# Patient Record
Sex: Female | Born: 1965
Health system: Southern US, Community
[De-identification: ages and names within clinical notes are randomized; demographics above are authoritative.]

## PROBLEM LIST (undated history)

## (undated) DIAGNOSIS — M5020 Other cervical disc displacement, unspecified cervical region: Secondary | ICD-10-CM

## (undated) DIAGNOSIS — M545 Low back pain, unspecified: Secondary | ICD-10-CM

## (undated) DIAGNOSIS — F419 Anxiety disorder, unspecified: Secondary | ICD-10-CM

## (undated) DIAGNOSIS — H9190 Unspecified hearing loss, unspecified ear: Secondary | ICD-10-CM

## (undated) DIAGNOSIS — E1143 Type 2 diabetes mellitus with diabetic autonomic (poly)neuropathy: Secondary | ICD-10-CM

## (undated) DIAGNOSIS — Z9889 Other specified postprocedural states: Secondary | ICD-10-CM

## (undated) DIAGNOSIS — J449 Chronic obstructive pulmonary disease, unspecified: Secondary | ICD-10-CM

## (undated) DIAGNOSIS — Z9289 Personal history of other medical treatment: Secondary | ICD-10-CM

## (undated) DIAGNOSIS — M1990 Unspecified osteoarthritis, unspecified site: Secondary | ICD-10-CM

## (undated) DIAGNOSIS — F32A Depression, unspecified: Secondary | ICD-10-CM

## (undated) DIAGNOSIS — E119 Type 2 diabetes mellitus without complications: Secondary | ICD-10-CM

## (undated) DIAGNOSIS — H409 Unspecified glaucoma: Secondary | ICD-10-CM

## (undated) DIAGNOSIS — I2510 Atherosclerotic heart disease of native coronary artery without angina pectoris: Secondary | ICD-10-CM

## (undated) DIAGNOSIS — R112 Nausea with vomiting, unspecified: Secondary | ICD-10-CM

## (undated) DIAGNOSIS — M722 Plantar fascial fibromatosis: Secondary | ICD-10-CM

## (undated) DIAGNOSIS — E785 Hyperlipidemia, unspecified: Secondary | ICD-10-CM

## (undated) DIAGNOSIS — K5792 Diverticulitis of intestine, part unspecified, without perforation or abscess without bleeding: Secondary | ICD-10-CM

## (undated) DIAGNOSIS — K219 Gastro-esophageal reflux disease without esophagitis: Secondary | ICD-10-CM

## (undated) DIAGNOSIS — G971 Other reaction to spinal and lumbar puncture: Secondary | ICD-10-CM

## (undated) DIAGNOSIS — F329 Major depressive disorder, single episode, unspecified: Secondary | ICD-10-CM

## (undated) DIAGNOSIS — F319 Bipolar disorder, unspecified: Secondary | ICD-10-CM

## (undated) DIAGNOSIS — G4730 Sleep apnea, unspecified: Secondary | ICD-10-CM

## (undated) DIAGNOSIS — J45909 Unspecified asthma, uncomplicated: Secondary | ICD-10-CM

## (undated) DIAGNOSIS — N390 Urinary tract infection, site not specified: Secondary | ICD-10-CM

## (undated) DIAGNOSIS — G4733 Obstructive sleep apnea (adult) (pediatric): Secondary | ICD-10-CM

## (undated) DIAGNOSIS — I10 Essential (primary) hypertension: Secondary | ICD-10-CM

## (undated) HISTORY — PX: OVARIAN CYST REMOVAL: SHX89

## (undated) HISTORY — DX: Chronic obstructive pulmonary disease, unspecified: J44.9

## (undated) HISTORY — DX: Atherosclerotic heart disease of native coronary artery without angina pectoris: I25.10

## (undated) HISTORY — DX: Plantar fascial fibromatosis: M72.2

## (undated) HISTORY — DX: Hyperlipidemia, unspecified: E78.5

## (undated) HISTORY — PX: ABDOMINAL HYSTERECTOMY: SHX81

## (undated) HISTORY — DX: Urinary tract infection, site not specified: N39.0

## (undated) HISTORY — DX: Obstructive sleep apnea (adult) (pediatric): G47.33

## (undated) HISTORY — PX: CARDIAC SURGERY: SHX584

## (undated) HISTORY — DX: Diverticulitis of intestine, part unspecified, without perforation or abscess without bleeding: K57.92

## (undated) HISTORY — DX: Unspecified glaucoma: H40.9

## (undated) HISTORY — DX: Type 2 diabetes mellitus with diabetic autonomic (poly)neuropathy: E11.43

## (undated) HISTORY — DX: Anxiety disorder, unspecified: F41.9

## (undated) HISTORY — DX: Personal history of other medical treatment: Z92.89

## (undated) HISTORY — DX: Bipolar disorder, unspecified: F31.9

---

## 1993-07-14 HISTORY — PX: ABDOMINAL SURGERY: SHX537

## 2008-07-14 HISTORY — PX: CORONARY ANGIOPLASTY WITH STENT PLACEMENT: SHX49

## 2013-09-18 DIAGNOSIS — K3184 Gastroparesis: Secondary | ICD-10-CM | POA: Diagnosis not present

## 2013-09-18 DIAGNOSIS — I2510 Atherosclerotic heart disease of native coronary artery without angina pectoris: Secondary | ICD-10-CM | POA: Diagnosis not present

## 2013-09-18 DIAGNOSIS — K219 Gastro-esophageal reflux disease without esophagitis: Secondary | ICD-10-CM | POA: Diagnosis not present

## 2013-09-18 DIAGNOSIS — R1011 Right upper quadrant pain: Secondary | ICD-10-CM | POA: Diagnosis not present

## 2014-10-26 ENCOUNTER — Emergency Department: Admit: 2014-10-26 | Disposition: A | Discharge status: Home / Self Care | Payer: Self-pay | Admitting: Internal Medicine

## 2014-10-26 DIAGNOSIS — R51 Headache: Secondary | ICD-10-CM | POA: Diagnosis not present

## 2014-10-26 DIAGNOSIS — I10 Essential (primary) hypertension: Secondary | ICD-10-CM | POA: Diagnosis not present

## 2014-10-26 LAB — DIFFERENTIAL
Basophil #: 0 10*3/uL (ref 0.0–0.1)
Basophil %: 0.5 %
Eosinophil #: 0.3 10*3/uL (ref 0.0–0.7)
Eosinophil %: 2.7 %
Lymphocyte #: 3.3 10*3/uL (ref 1.0–3.6)
Lymphocyte %: 32.1 %
Monocyte #: 0.8 x10 3/mm (ref 0.2–0.9)
Monocyte %: 8 %
Neutrophil #: 5.9 10*3/uL (ref 1.4–6.5)
Neutrophil %: 56.7 %

## 2014-10-26 LAB — BASIC METABOLIC PANEL
Anion Gap: 7 (ref 7–16)
BUN: 17 mg/dL
Calcium, Total: 9.3 mg/dL
Chloride: 106 mmol/L
Co2: 27 mmol/L
Creatinine: 0.81 mg/dL
EGFR (African American): >60
EGFR (Non-African Amer.): >60
Glucose: 96 mg/dL
Potassium: 3.7 mmol/L
Sodium: 140 mmol/L

## 2014-10-26 LAB — CBC
HCT: 41.4 % (ref 35.0–47.0)
HGB: 13.9 g/dL (ref 12.0–16.0)
MCH: 29.5 pg (ref 26.0–34.0)
MCHC: 33.6 g/dL (ref 32.0–36.0)
MCV: 88 fL (ref 80–100)
Platelet: 342 10*3/uL (ref 150–440)
RBC: 4.72 10*6/uL (ref 3.80–5.20)
RDW: 14.3 % (ref 11.5–14.5)
WBC: 11.3 10*3/uL — ABNORMAL HIGH (ref 3.6–11.0)

## 2014-10-26 LAB — URINALYSIS, COMPLETE
Bacteria: NONE SEEN
Bilirubin,UR: NEGATIVE
Blood: NEGATIVE
Glucose,UR: NEGATIVE mg/dL (ref 0–75)
Leukocyte Esterase: NEGATIVE
Nitrite: NEGATIVE
Ph: 5 (ref 4.5–8.0)
Protein: 30
Specific Gravity: 1.028 (ref 1.003–1.030)

## 2014-10-26 LAB — HEPATIC FUNCTION PANEL A (ARMC)
Albumin: 4.3 g/dL
Alkaline Phosphatase: 90 U/L
Bilirubin, Direct: <0.1 mg/dL
Bilirubin,Total: 0.7 mg/dL
Indirect Bilirubin: 0.7
SGOT(AST): 24 U/L
SGPT (ALT): 35 U/L
Total Protein: 7.6 g/dL

## 2014-10-26 LAB — TROPONIN I: Troponin-I: <0.03 ng/mL

## 2014-10-26 LAB — PROTIME-INR
INR: 0.9
Prothrombin Time: 12.8 secs

## 2014-10-26 LAB — LIPASE, BLOOD: Lipase: 28 U/L

## 2014-10-26 LAB — VALPROIC ACID LEVEL: Valproic Acid: 93 ug/mL (ref 50–100)

## 2015-01-02 DIAGNOSIS — L309 Dermatitis, unspecified: Secondary | ICD-10-CM | POA: Diagnosis not present

## 2015-01-02 DIAGNOSIS — Z955 Presence of coronary angioplasty implant and graft: Secondary | ICD-10-CM | POA: Diagnosis not present

## 2015-01-02 DIAGNOSIS — I10 Essential (primary) hypertension: Secondary | ICD-10-CM | POA: Diagnosis not present

## 2015-01-12 ENCOUNTER — Other Ambulatory Visit: Payer: Self-pay

## 2015-01-12 ENCOUNTER — Observation Stay
Admission: EM | Admit: 2015-01-12 | Discharge: 2015-01-13 | Disposition: A | Discharge status: Home / Self Care | Payer: Medicare Other | Attending: Surgery | Admitting: Surgery

## 2015-01-12 ENCOUNTER — Emergency Department: Payer: Medicare Other

## 2015-01-12 ENCOUNTER — Observation Stay: Payer: Medicare Other

## 2015-01-12 DIAGNOSIS — G8929 Other chronic pain: Secondary | ICD-10-CM

## 2015-01-12 DIAGNOSIS — R112 Nausea with vomiting, unspecified: Principal | ICD-10-CM | POA: Diagnosis present

## 2015-01-12 DIAGNOSIS — R1110 Vomiting, unspecified: Secondary | ICD-10-CM

## 2015-01-12 DIAGNOSIS — F17210 Nicotine dependence, cigarettes, uncomplicated: Secondary | ICD-10-CM | POA: Insufficient documentation

## 2015-01-12 DIAGNOSIS — E669 Obesity, unspecified: Secondary | ICD-10-CM | POA: Diagnosis not present

## 2015-01-12 DIAGNOSIS — E119 Type 2 diabetes mellitus without complications: Secondary | ICD-10-CM | POA: Diagnosis not present

## 2015-01-12 DIAGNOSIS — R197 Diarrhea, unspecified: Secondary | ICD-10-CM | POA: Insufficient documentation

## 2015-01-12 DIAGNOSIS — R109 Unspecified abdominal pain: Secondary | ICD-10-CM | POA: Diagnosis not present

## 2015-01-12 DIAGNOSIS — J45909 Unspecified asthma, uncomplicated: Secondary | ICD-10-CM | POA: Diagnosis not present

## 2015-01-12 DIAGNOSIS — K219 Gastro-esophageal reflux disease without esophagitis: Secondary | ICD-10-CM | POA: Insufficient documentation

## 2015-01-12 DIAGNOSIS — R1012 Left upper quadrant pain: Secondary | ICD-10-CM | POA: Diagnosis not present

## 2015-01-12 DIAGNOSIS — I10 Essential (primary) hypertension: Secondary | ICD-10-CM | POA: Insufficient documentation

## 2015-01-12 DIAGNOSIS — Z888 Allergy status to other drugs, medicaments and biological substances status: Secondary | ICD-10-CM | POA: Insufficient documentation

## 2015-01-12 DIAGNOSIS — F319 Bipolar disorder, unspecified: Secondary | ICD-10-CM | POA: Diagnosis not present

## 2015-01-12 DIAGNOSIS — R1084 Generalized abdominal pain: Secondary | ICD-10-CM | POA: Diagnosis not present

## 2015-01-12 DIAGNOSIS — Z6832 Body mass index (BMI) 32.0-32.9, adult: Secondary | ICD-10-CM | POA: Insufficient documentation

## 2015-01-12 HISTORY — DX: Depression, unspecified: F32.A

## 2015-01-12 HISTORY — DX: Essential (primary) hypertension: I10

## 2015-01-12 HISTORY — DX: Unspecified asthma, uncomplicated: J45.909

## 2015-01-12 HISTORY — DX: Major depressive disorder, single episode, unspecified: F32.9

## 2015-01-12 HISTORY — DX: Bipolar disorder, unspecified: F31.9

## 2015-01-12 HISTORY — DX: Type 2 diabetes mellitus without complications: E11.9

## 2015-01-12 LAB — CBC WITH DIFFERENTIAL/PLATELET
Basophils Absolute: 0.3 10*3/uL — ABNORMAL HIGH (ref 0–0.1)
Basophils Relative: 1 %
Eosinophils Absolute: 0.2 10*3/uL (ref 0–0.7)
Eosinophils Relative: 1 %
HCT: 44.8 % (ref 35.0–47.0)
Hemoglobin: 15 g/dL (ref 12.0–16.0)
Lymphocytes Relative: 18 %
Lymphs Abs: 3.8 10*3/uL — ABNORMAL HIGH (ref 1.0–3.6)
MCH: 29.6 pg (ref 26.0–34.0)
MCHC: 33.4 g/dL (ref 32.0–36.0)
MCV: 88.5 fL (ref 80.0–100.0)
Monocytes Absolute: 0.6 10*3/uL (ref 0.2–0.9)
Monocytes Relative: 3 %
Neutro Abs: 15.5 10*3/uL — ABNORMAL HIGH (ref 1.4–6.5)
Neutrophils Relative %: 77 %
Platelets: 407 10*3/uL (ref 150–440)
RBC: 5.07 MIL/uL (ref 3.80–5.20)
RDW: 13.5 % (ref 11.5–14.5)
WBC: 20.4 10*3/uL — ABNORMAL HIGH (ref 3.6–11.0)

## 2015-01-12 LAB — URINALYSIS COMPLETE WITH MICROSCOPIC (ARMC ONLY)
Bilirubin Urine: NEGATIVE
Glucose, UA: NEGATIVE mg/dL
Hgb urine dipstick: NEGATIVE
Ketones, ur: NEGATIVE mg/dL
Nitrite: NEGATIVE
Protein, ur: 100 mg/dL — AB
Specific Gravity, Urine: 1.024 (ref 1.005–1.030)
pH: 9 — ABNORMAL HIGH (ref 5.0–8.0)

## 2015-01-12 LAB — COMPREHENSIVE METABOLIC PANEL
ALT: 78 U/L — ABNORMAL HIGH (ref 14–54)
AST: 38 U/L (ref 15–41)
Albumin: 4.8 g/dL (ref 3.5–5.0)
Alkaline Phosphatase: 117 U/L (ref 38–126)
Anion gap: 17 — ABNORMAL HIGH (ref 5–15)
BUN: 14 mg/dL (ref 6–20)
CO2: 22 mmol/L (ref 22–32)
Calcium: 10 mg/dL (ref 8.9–10.3)
Chloride: 102 mmol/L (ref 101–111)
Creatinine, Ser: 0.82 mg/dL (ref 0.44–1.00)
GFR calc Af Amer: >60 mL/min (ref >60)
GFR calc non Af Amer: >60 mL/min (ref >60)
Glucose, Bld: 150 mg/dL — ABNORMAL HIGH (ref 65–99)
Potassium: 3.8 mmol/L (ref 3.5–5.1)
Sodium: 141 mmol/L (ref 135–145)
Total Bilirubin: 0.4 mg/dL (ref 0.3–1.2)
Total Protein: 8.5 g/dL — ABNORMAL HIGH (ref 6.5–8.1)

## 2015-01-12 LAB — LIPASE, BLOOD: Lipase: 34 U/L (ref 22–51)

## 2015-01-12 MED ORDER — ONDANSETRON HCL 4 MG/2ML IJ SOLN
INTRAMUSCULAR | Status: AC
  Filled 2015-01-12: qty 2

## 2015-01-12 MED ORDER — IOHEXOL 240 MG/ML SOLN
25.0000 mL | Freq: Once | INTRAMUSCULAR | Status: AC | PRN
  Administered 2015-01-12: 25 mL via INTRAVENOUS

## 2015-01-12 MED ORDER — HYDROMORPHONE HCL 1 MG/ML IJ SOLN
INTRAMUSCULAR | Status: AC
  Administered 2015-01-12: 1 mg via INTRAVENOUS
  Filled 2015-01-12: qty 1

## 2015-01-12 MED ORDER — BUTAMBEN-TETRACAINE-BENZOCAINE 2-2-14 % EX AERO
3.0000 | INHALATION_SPRAY | Freq: Once | CUTANEOUS | Status: AC
  Administered 2015-01-13: 3 via TOPICAL
  Filled 2015-01-12: qty 20

## 2015-01-12 MED ORDER — MORPHINE SULFATE 4 MG/ML IJ SOLN: 4.0000 mg | Freq: Once | INTRAMUSCULAR | Status: DC

## 2015-01-12 MED ORDER — SODIUM CHLORIDE 0.9 % IV BOLUS (SEPSIS)
1000.0000 mL | Freq: Once | INTRAVENOUS | Status: AC
  Administered 2015-01-12: 1000 mL via INTRAVENOUS

## 2015-01-12 MED ORDER — FAMOTIDINE IN NACL 20-0.9 MG/50ML-% IV SOLN
INTRAVENOUS | Status: AC
  Administered 2015-01-12: 20 mg via INTRAVENOUS
  Filled 2015-01-12: qty 50

## 2015-01-12 MED ORDER — SODIUM CHLORIDE 0.45 % IV SOLN
INTRAVENOUS | Status: DC
  Administered 2015-01-12: 23:00:00 via INTRAVENOUS

## 2015-01-12 MED ORDER — MORPHINE SULFATE 4 MG/ML IJ SOLN
4.0000 mg | INTRAMUSCULAR | Status: DC | PRN
  Administered 2015-01-13 (×2): 4 mg via INTRAVENOUS
  Filled 2015-01-12 (×2): qty 1

## 2015-01-12 MED ORDER — ONDANSETRON HCL 4 MG/2ML IJ SOLN: 4.0000 mg | Freq: Once | INTRAMUSCULAR | Status: DC

## 2015-01-12 MED ORDER — MORPHINE SULFATE 2 MG/ML IJ SOLN: 2.0000 mg | INTRAMUSCULAR | Status: DC | PRN

## 2015-01-12 MED ORDER — HYDROMORPHONE HCL 1 MG/ML IJ SOLN
1.0000 mg | Freq: Once | INTRAMUSCULAR | Status: AC
  Administered 2015-01-12: 1 mg via INTRAVENOUS

## 2015-01-12 MED ORDER — FAMOTIDINE IN NACL 20-0.9 MG/50ML-% IV SOLN
20.0000 mg | Freq: Two times a day (BID) | INTRAVENOUS | Status: DC
  Administered 2015-01-12 – 2015-01-13 (×2): 20 mg via INTRAVENOUS
  Filled 2015-01-12 (×3): qty 50

## 2015-01-12 MED ORDER — ONDANSETRON 8 MG PO TBDP
8.0000 mg | ORAL_TABLET | Freq: Once | ORAL | Status: AC
  Administered 2015-01-12: 8 mg via ORAL

## 2015-01-12 MED ORDER — PROMETHAZINE HCL 25 MG/ML IJ SOLN
12.5000 mg | Freq: Once | INTRAMUSCULAR | Status: AC
  Administered 2015-01-12: 12.5 mg via INTRAVENOUS

## 2015-01-12 MED ORDER — IOHEXOL 300 MG/ML  SOLN
100.0000 mL | Freq: Once | INTRAMUSCULAR | Status: AC | PRN
  Administered 2015-01-12: 100 mL via INTRAVENOUS

## 2015-01-12 MED ORDER — ONDANSETRON HCL 4 MG/2ML IJ SOLN
INTRAMUSCULAR | Status: AC
  Administered 2015-01-12: 4 mg via INTRAVENOUS
  Filled 2015-01-12: qty 2

## 2015-01-12 MED ORDER — ONDANSETRON 8 MG PO TBDP
ORAL_TABLET | ORAL | Status: AC
  Administered 2015-01-12: 8 mg via ORAL
  Filled 2015-01-12: qty 1

## 2015-01-12 MED ORDER — PROMETHAZINE HCL 25 MG/ML IJ SOLN: 12.5000 mg | INTRAMUSCULAR | Status: DC | PRN

## 2015-01-12 MED ORDER — PROMETHAZINE HCL 25 MG/ML IJ SOLN
INTRAMUSCULAR | Status: AC
  Administered 2015-01-12: 12.5 mg
  Filled 2015-01-12: qty 1

## 2015-01-12 MED ORDER — ONDANSETRON HCL 4 MG/2ML IJ SOLN
4.0000 mg | Freq: Once | INTRAMUSCULAR | Status: AC
  Administered 2015-01-12: 4 mg via INTRAVENOUS

## 2015-01-12 NOTE — ED Notes (Signed)
Pt c/o LUQ pain that radiates into her back with N/V/D that started at 430am..

## 2015-01-12 NOTE — ED Notes (Signed)
Pt has agreed to NG tube if ordered

## 2015-01-12 NOTE — ED Provider Notes (Signed)
[facility] Emergency Department Provider Note  ____________________________________________  Time seen: Approximately 6:54 PM  I have reviewed the triage vital signs and the nursing notes.   HISTORY  Chief Complaint Abdominal Pain; Nausea; Emesis; and Diarrhea    HPI Shawna Anderson is a 49 y.o. female with history of coronary artery disease status post stents, bipolar disorder, hypertension, hyperlipidemia, multiple abdominal operations including bowel resection for perforation, hysterectomy who presents for evaluation of 13 hours sharp left-sided abdominal pain and recurrent nonbloody nonbilious emesis. Sudden onset, constant since onset. Currently her symptoms are severe. She has also had nonbloody diarrhea and subjective fever. No chest pain or trouble breathing. No modifying factors. She reports she has had these symptoms in the past but does not remember what they were attributed to.   Past Medical History  Diagnosis Date  . Bipolar 1 disorder   . Depression   . Asthma     There are no active problems to display for this patient.   Past Surgical History  Procedure Laterality Date  . Abdominal surgery    . Coronary angioplasty with stent placement      No current outpatient prescriptions on file.  Allergies Review of patient's allergies indicates not on file.  No family history on file.  Social History History  Substance Use Topics  . Smoking status: Current Every Day Smoker -- 0.50 packs/day    Types: Cigarettes  . Smokeless tobacco: Never Used  . Alcohol Use: No    Review of Systems Constitutional: + subjective fever/chills Eyes: No visual changes. ENT: No sore throat. Cardiovascular: Denies chest pain. Respiratory: Denies shortness of breath. Gastrointestinal: + abdominal pain.  + nausea, + vomiting.  + diarrhea.  No constipation. Genitourinary: Negative for dysuria. Musculoskeletal: Negative for back pain. Skin: Negative  for rash. Neurological: Negative for headaches, focal weakness or numbness.  10-point ROS otherwise negative.  ____________________________________________   PHYSICAL EXAM:  VITAL SIGNS: ED Triage Vitals  Enc Vitals Group     BP 01/12/15 1728 167/155 mmHg     Pulse Rate 01/12/15 1728 124     Resp 01/12/15 1728 24     Temp 01/12/15 1728 98.2 F (36.8 C)     Temp Source 01/12/15 1728 Axillary     SpO2 01/12/15 1728 96 %     Weight 01/12/15 1728 185 lb (83.915 kg)     Height 01/12/15 1728 5\' 3"  (1.6 m)     Head Cir --      Peak Flow --      Pain Score 01/12/15 1729 10     Pain Loc --      Pain Edu? --      Excl. in [place]? --     Constitutional: Alert and oriented. A mild distress secondary to pain, vomiting/actively retching. Eyes: Conjunctivae are normal. PERRL. EOMI. Head: Atraumatic. Nose: No congestion/rhinnorhea. Mouth/Throat: Mucous membranes are dry.  Oropharynx non-erythematous. Neck: No stridor.  Cardiovascular: tachycardic rate, regular rhythm. Grossly normal heart sounds.  Good peripheral circulation. Respiratory: Normal respiratory effort.  No retractions. Lungs CTAB. Gastrointestinal: Soft with moderate tenderness to palpation throughout the left abdomen. No distention. No abdominal bruits. No CVA tenderness. Genitourinary: deferred Musculoskeletal: No lower extremity tenderness nor edema.  No joint effusions. Neurologic:  Normal speech and language. No gross focal neurologic deficits are appreciated. Speech is normal. No gait instability. Skin:  Skin is warm, dry and intact. No rash noted. Psychiatric: Mood and affect are normal. Speech and behavior are  normal.  ____________________________________________   LABS (all labs ordered are listed, but only abnormal results are displayed)  Labs Reviewed  CBC WITH DIFFERENTIAL/PLATELET - Abnormal; Notable for the following:    WBC 20.4 (*)    Neutro Abs 15.5 (*)    Lymphs Abs 3.8 (*)    Basophils Absolute 0.3  (*)    All other components within normal limits  COMPREHENSIVE METABOLIC PANEL - Abnormal; Notable for the following:    Glucose, Bld 150 (*)    Total Protein 8.5 (*)    ALT 78 (*)    Anion gap 17 (*)    All other components within normal limits  URINALYSIS COMPLETEWITH MICROSCOPIC ([facility] ONLY) - Abnormal; Notable for the following:    Color, Urine YELLOW (*)    APPearance CLOUDY (*)    pH 9.0 (*)    Protein, ur 100 (*)    Leukocytes, UA TRACE (*)    Bacteria, UA RARE (*)    Squamous Epithelial / LPF 6-30 (*)    All other components within normal limits  LIPASE, BLOOD   ____________________________________________  EKG  ED ECG REPORT I, Joanne Gavel, the attending physician, personally viewed and interpreted this ECG.   Date: 01/12/2015  EKG Time: 17:48  Rate: 131  Rhythm: sinus tachycardia  Axis: left  Intervals:none  ST&T Change: Severe motion artifact, no STEMI  ____________________________________________  RADIOLOGY  CT abdomen and pelvis  IMPRESSION: Suggestion of focal small bowel wall thickness possibly with a small intussusception in the proximal small bowel of the left mid abdomen. There is no evidence of small bowel obstruction. Further evaluation with small bowel series is recommended.  ____________________________________________   PROCEDURES  Procedure(s) performed: None  Critical Care performed: No  ____________________________________________   INITIAL IMPRESSION / ASSESSMENT AND PLAN / ED COURSE  Pertinent labs & imaging results that were available during my care of the patient were reviewed by me and considered in my medical decision making (see chart for details).  Shawna Anderson is a 49 y.o. female with history of coronary artery disease status post stents, bipolar disorder, hypertension, hyperlipidemia, multiple abdominal operations including bowel resection for perforation, hysterectomy who presents for evaluation of 13 hours  sharp left-sided abdominal pain and recurrent nonbloody nonbilious emesis. On exam, she is actively retching, appears to be in mild distress. She is tachycardic with moderate left-sided abdominal tenderness. Plan for screening labs, EKG, likely CT of the abdomen and pelvis. We'll give IV fluids, antiemetics and pain control.  ----------------------------------------- 9:49 PM on 01/12/2015 -----------------------------------------  CT concerning for possible small bowel intussusception. Discussed with Dr. Bary Castilla of general surgery who will admit for further management though he reports that small bowel intussusception in this patient is quite unlikely. ____________________________________________   FINAL CLINICAL IMPRESSION(S) / ED DIAGNOSES  Final diagnoses:  Left sided abdominal pain  Nausea, vomiting and diarrhea      Joanne Gavel, MD 01/12/15 2326

## 2015-01-12 NOTE — ED Notes (Signed)
MD at bedside. 

## 2015-01-12 NOTE — H&P (Signed)
Shawna Anderson is an 49 y.o. female.   Chief Complaint: Abdominal pain, vomiting. HPI: This 49 year old woman native of [place], [place] recently moved [place] in April of this year due to finances. She is yet to establish herself with a primary care physician and reports that she's been taking none of her regular medication since April. She was in the emergency department at that time with a blood pressure 250/150 and successfully treated. She again has been taking no regular medicine since that time. The patient reports that she was awakened from sleep at 4 AM on the morning of June 30 with left-sided abdominal pain that moved into the left upper quadrant. This is since been associated with multiple episodes of nausea and vomiting. She reported no change in bowel habits.  She reports a long history of reflux and acid indigestion.  She reports that prior to moving to [place] she did have a stress test, results are available.  The patient reports that she's had abdominal difficulties since childhood. Exploration in her 65s resulted in the resection of some part of her intestine although she is unclear what. Even after that she continued to have episodic abdominal pain and reported frequent episodes of abdominal bloating. The present episode was similar to what she experienced over a year ago while still living in [place] resulting in hospitalization but no surgical intervention.  Past Medical History  Diagnosis Date  . Bipolar 1 disorder   . Depression   . Asthma   . Diabetes mellitus without complication   . Hypertension     Past Surgical History  Procedure Laterality Date  . Abdominal surgery  1995    Bowel resection.  . Coronary angioplasty with stent placement  2010    Drug eluting stent    No family history on file. Social History:  reports that she has been smoking Cigarettes.  She has been smoking about 0.50 packs per day. She has never used  smokeless tobacco. She reports that she uses illicit drugs (Marijuana). She reports that she does not drink alcohol.  Allergies:  Allergies  Allergen Reactions  . Drixoral Allergy Sinus [Dexbromphen-Pse-Apap Er] Rash     (Not in a hospital admission)  Results for orders placed or performed during the hospital encounter of 01/12/15 (from the past 48 hour(s))  CBC with Differential     Status: Abnormal   Collection Time: 01/12/15  5:33 PM  Result Value Ref Range   WBC 20.4 (H) 3.6 - 11.0 K/uL   RBC 5.07 3.80 - 5.20 MIL/uL   Hemoglobin 15.0 12.0 - 16.0 g/dL   HCT 44.8 35.0 - 47.0 %   MCV 88.5 80.0 - 100.0 fL   MCH 29.6 26.0 - 34.0 pg   MCHC 33.4 32.0 - 36.0 g/dL   RDW 13.5 11.5 - 14.5 %   Platelets 407 150 - 440 K/uL   Neutrophils Relative % 77 %   Neutro Abs 15.5 (H) 1.4 - 6.5 K/uL   Lymphocytes Relative 18 %   Lymphs Abs 3.8 (H) 1.0 - 3.6 K/uL   Monocytes Relative 3 %   Monocytes Absolute 0.6 0.2 - 0.9 K/uL   Eosinophils Relative 1 %   Eosinophils Absolute 0.2 0 - 0.7 K/uL   Basophils Relative 1 %   Basophils Absolute 0.3 (H) 0 - 0.1 K/uL  Comprehensive metabolic panel     Status: Abnormal   Collection Time: 01/12/15  5:33 PM  Result Value Ref Range  Sodium 141 135 - 145 mmol/L   Potassium 3.8 3.5 - 5.1 mmol/L   Chloride 102 101 - 111 mmol/L   CO2 22 22 - 32 mmol/L   Glucose, Bld 150 (H) 65 - 99 mg/dL   BUN 14 6 - 20 mg/dL   Creatinine, Ser 0.82 0.44 - 1.00 mg/dL   Calcium 10.0 8.9 - 10.3 mg/dL   Total Protein 8.5 (H) 6.5 - 8.1 g/dL   Albumin 4.8 3.5 - 5.0 g/dL   AST 38 15 - 41 U/L   ALT 78 (H) 14 - 54 U/L   Alkaline Phosphatase 117 38 - 126 U/L   Total Bilirubin 0.4 0.3 - 1.2 mg/dL   GFR calc non Af Amer >60 >60 mL/min   GFR calc Af Amer >60 >60 mL/min    Comment: (NOTE) The eGFR has been calculated using the CKD EPI equation. This calculation has not been validated in all clinical situations. eGFR's persistently <60 mL/min signify possible Chronic  Kidney Disease.    Anion gap 17 (H) 5 - 15  Lipase, blood     Status: None   Collection Time: 01/12/15  5:33 PM  Result Value Ref Range   Lipase 34 22 - 51 U/L  Urinalysis complete, with microscopic ([facility] only)     Status: Abnormal   Collection Time: 01/12/15  5:33 PM  Result Value Ref Range   Color, Urine YELLOW (A) YELLOW   APPearance CLOUDY (A) CLEAR   Glucose, UA NEGATIVE NEGATIVE mg/dL   Bilirubin Urine NEGATIVE NEGATIVE   Ketones, ur NEGATIVE NEGATIVE mg/dL   Specific Gravity, Urine 1.024 1.005 - 1.030   Hgb urine dipstick NEGATIVE NEGATIVE   pH 9.0 (H) 5.0 - 8.0   Protein, ur 100 (A) NEGATIVE mg/dL   Nitrite NEGATIVE NEGATIVE   Leukocytes, UA TRACE (A) NEGATIVE   RBC / HPF 6-30 0 - 5 RBC/hpf   WBC, UA 0-5 0 - 5 WBC/hpf   Bacteria, UA RARE (A) NONE SEEN   Squamous Epithelial / LPF 6-30 (A) NONE SEEN   Mucous PRESENT    Ct Abdomen Pelvis W Contrast  01/12/2015   CLINICAL DATA:  Left upper quadrant pain. Nausea, vomiting, diarrhea today.  EXAM: CT ABDOMEN AND PELVIS WITH CONTRAST  TECHNIQUE: Multidetector CT imaging of the abdomen and pelvis was performed using the standard protocol following bolus administration of intravenous contrast.  CONTRAST:  126m OMNIPAQUE IOHEXOL 300 MG/ML  SOLN  COMPARISON:  None.  FINDINGS: The liver, spleen, pancreas, gallbladder, adrenal glands and kidneys are normal. There is mild atherosclerosis of the abdominal aorta without aneurysmal dilatation. There is no abdominal lymphadenopathy.  There is suggestion of focal small bowel wall thickening possibly with a small intussusception in the proximal small bowel of the left mid abdomen. There is no small bowel obstruction. The colon is normal. The appendix tip is normal. The remainder of the appendix is not seen but no inflammation is noted around cecum.  There is mild scar of the posterior right lung base. There is no focal pneumonia or pleural effusion. No acute abnormalities identified in the visualized  bones.  IMPRESSION: Suggestion of focal small bowel wall thickness possibly with a small intussusception in the proximal small bowel of the left mid abdomen. There is no evidence of small bowel obstruction. Further evaluation with small bowel series is recommended.   Electronically Signed   By: WAbelardo DieselM.D.   On: 01/12/2015 21:12    Review of Systems  Constitutional: Negative.  HENT: Negative.   Eyes: Negative.   Respiratory: Positive for shortness of breath.   Cardiovascular: Negative.   Gastrointestinal: Positive for heartburn, nausea, vomiting and abdominal pain.  Genitourinary: Negative.   Musculoskeletal: Negative.   Skin: Negative.   Neurological: Negative.   Endo/Heme/Allergies: Negative.     Blood pressure 167/155, pulse 124, temperature 98.2 F (36.8 C), temperature source Axillary, resp. rate 24, height _0  (1.6 m), weight 185 lb (83.915 kg), SpO2 96 %. Physical Exam  Constitutional: She is oriented to person, place, and time. She appears well-developed and well-nourished.  HENT:  Head: Normocephalic and atraumatic.  Eyes: Conjunctivae are normal.  Neck: Normal range of motion. Neck supple. No thyromegaly present.  Cardiovascular: Normal rate and regular rhythm.   Respiratory: Effort normal and breath sounds normal.  GI: Soft. Bowel sounds are normal. Tenderness: Minimal left upper quadrant tenderness. No peritoneal irritation or guarding.   Musculoskeletal: Normal range of motion.  Neurological: She is alert and oriented to person, place, and time. She has normal reflexes.  Skin: Skin is warm and dry.  Psychiatric: Thought content normal. Her speech is rapid and/or pressured. She is hyperactive. Cognition and memory are normal.     Assessment/Plan Laboratory and CT studies reviewed. No evidence of free air. Questionable intussusception. Minimal proximal bowel dilatation. Marked gastric distention with oral CT contrast.  The patient continues to report pain  nauseated in spite of antiemetics. NG tube recommended and vehemently declined.  We'll hold the patient nothing by mouth, make use of IV H2 blocker and arrange for plain films of the abdomen.  Medical consultation will be obtained in the morning to review medication renewal.  Robert Bellow 01/12/2015, 10:33 PM

## 2015-01-12 NOTE — ED Notes (Signed)
Pt expressed reluctance to have NG tube and was told by surgeon that she had right to refuse it and explained to her that the nausea and pressure would be greatly reduced by it. Pt still not willing.

## 2015-01-13 ENCOUNTER — Encounter: Payer: Self-pay | Admitting: *Deleted

## 2015-01-13 LAB — CBC WITH DIFFERENTIAL/PLATELET
Basophils Absolute: 0.2 10*3/uL — ABNORMAL HIGH (ref 0–0.1)
Basophils Relative: 1 %
Eosinophils Absolute: 0 10*3/uL (ref 0–0.7)
Eosinophils Relative: 0 %
HCT: 41.9 % (ref 35.0–47.0)
Hemoglobin: 13.6 g/dL (ref 12.0–16.0)
Lymphocytes Relative: 20 %
Lymphs Abs: 3.4 10*3/uL (ref 1.0–3.6)
MCH: 28.7 pg (ref 26.0–34.0)
MCHC: 32.4 g/dL (ref 32.0–36.0)
MCV: 88.8 fL (ref 80.0–100.0)
Monocytes Absolute: 1.2 10*3/uL — ABNORMAL HIGH (ref 0.2–0.9)
Monocytes Relative: 7 %
Neutro Abs: 11.7 10*3/uL — ABNORMAL HIGH (ref 1.4–6.5)
Neutrophils Relative %: 72 %
Platelets: 366 10*3/uL (ref 150–440)
RBC: 4.72 MIL/uL (ref 3.80–5.20)
RDW: 13.8 % (ref 11.5–14.5)
WBC: 16.5 10*3/uL — ABNORMAL HIGH (ref 3.6–11.0)

## 2015-01-13 MED ORDER — PHENOL 1.4 % MT LIQD
1.0000 | OROMUCOSAL | Status: DC | PRN
  Filled 2015-01-13: qty 177

## 2015-01-13 NOTE — Care Management Note (Signed)
Case Management Note  Patient Details  Name: Shawna Anderson MRN: [id number] Date of Birth: 1965/10/16  Subjective/Objective:         [name] was already discharged and off the unit at 2pm when this writer arrived at her hospital room to present her with a Medicare OBS letter.            Action/Plan:   Expected Discharge Date:                  Expected Discharge Plan:     In-House Referral:     Discharge planning Services     Post Acute Care Choice:    Choice offered to:     DME Arranged:    DME Agency:     HH Arranged:    [place] Agency:     Status of Service:     Medicare Important Message Given:    Date Medicare IM Given:    Medicare IM give by:    Date Additional Medicare IM Given:    Additional Medicare Important Message give by:     If discussed at [place] of Stay Meetings, dates discussed:    Additional Comments:  Jennalee Greaves A, RN 01/13/2015, 1:57 PM

## 2015-01-13 NOTE — Discharge Instructions (Signed)
DISCHARGE INSTRUCTIONS TO PATIENT  REMINDER:   Carry a list of your medications and allergies with you at all times  Call your pharmacy at least 1 week in advance to refill prescriptions  Do not mix any prescribed pain medicine with alcohol  Do not drive any motor vehicles while taking pain medication.  Take medications with food.  Do not retake a pain medication if you vomit after taking it.  Activity: no lifting more than 15 pounds until instructed by your doctor.   Dressing Care Instruction (if applicable):              Remove operative dressings in 48 hours.  May Shower-  Call office if any questions regarding this activity.  Dry Dressing as needed to operative site.  Drain care instructions provided to you in the hospital.   Follow-up appointments (date to return to physician): Call for appointment with Dr. Sherri Rad, MD at [phone] or [phone]  If need MD on call after hours and on weekends call Hospital operator at [phone] as ask to speak to Surgeon on call for [facility].  Call Surgeon if you have:  Temperature greater than 100.4  Persistent nausea and vomiting  Severe uncontrolled pain  Redness, tenderness, or signs of infection (pain, swelling, redness, odor or green/yellow discharge around the site)  Difficulty breathing, headache or visual disturbances  Hives  Persistent dizziness or light-headedness  Extreme fatigue  Any other questions or concerns you may have after discharge  In an emergency, call 911 or go to an Emergency Department at a nearby hospital  Diet:  Resume your usual diet.  Avoid spicy, greasy or heavy foods.  If you have nausea or vomiting, go back to liquids.  If you cannot keep liquids down, call your doctor.  Avoid alcohol consumption while on prescription pain medications. Good nutrition promotes healing. Increase fiber and fluids.     I understand and acknowledge receipt of the above instructions.                                                                                                                                         Patient or Guardian Signature                                                                    Date/Time  Physician's or R.N.'s Signature                                                                  Date/Time  The discharge instructions have been reviewed with the patient and/or Family Member/Parent/Guardian.  Patient and/or Family Member/Parent/Guardian signed and retained a printed copy.

## 2015-01-13 NOTE — Discharge Summary (Signed)
Physician Discharge Summary  Patient ID: Shawna Anderson MRN: [id number] DOB/AGE: 1966-06-07 49 y.o.  Admit date: 01/12/2015 Discharge date: 01/13/2015  Admission Diagnoses: Abdominal pain, radiographic intussusception.  Discharge Diagnoses:  Active Problems:   Abdominal pain   Vomiting   Nausea and vomiting   Discharged Condition: Improved abdominal pain resolved.  Hospital Course: The patient was admitted with abdominal pain and leukocytosis. X-rays demonstrated what appeared to be an intussusception seen radiographically with no evidence of proximal small bowel obstruction. Nasogastric tube was not placed. By the following morning the patient was tolerating a regular diet and was pain-free and wanted to be discharged.  Consults: none.  Significant Diagnostic Studies: CT scan abdomen and pelvis  Discharge Exam: Blood pressure 142/92, pulse 65, temperature 98.6 F (37 C), temperature source Oral, resp. rate 16, height 5\' 3"  (1.6 m), weight 83.008 kg (183 lb), SpO2 93 %. Abdomen was soft obese and nontender with multiple scars.  Disposition: Final discharge disposition not confirmed     Medication List    TAKE these medications        albuterol 108 (90 BASE) MCG/ACT inhaler  Commonly known as:  PROVENTIL HFA;VENTOLIN HFA  Inhale 2 puffs into the lungs every 4 (four) hours as needed for wheezing or shortness of breath.     aspirin 81 MG tablet  Take 81 mg by mouth daily.     clopidogrel 75 MG tablet  Commonly known as:  PLAVIX  Take 75 mg by mouth daily.     divalproex 500 MG 24 hr tablet  Commonly known as:  DEPAKOTE ER  Take by mouth daily.     famotidine 40 MG tablet  Commonly known as:  PEPCID  Take 40 mg by mouth daily.     Fluticasone-Salmeterol 250-50 MCG/DOSE Aepb  Commonly known as:  ADVAIR  Inhale 1 puff into the lungs 2 (two) times daily.     lisinopril 10 MG tablet  Commonly known as:  PRINIVIL,ZESTRIL  Take 10 mg by mouth daily.     metoprolol 100 MG tablet  Commonly known as:  LOPRESSOR  Take 100 mg by mouth 2 (two) times daily.     pantoprazole 40 MG tablet  Commonly known as:  PROTONIX  Take 40 mg by mouth daily.     sertraline 50 MG tablet  Commonly known as:  ZOLOFT  Take 50 mg by mouth daily.     traZODone 150 MG tablet  Commonly known as:  DESYREL  Take 150 mg by mouth at bedtime.           Follow-up Information    Follow up with Sherri Rad, MD.   Specialties:  Surgery, Radiology   Why:  As needed   Contact information:   [address] [place] [address] [phone]       Signed: Sherri Rad 01/13/2015, 11:20 AM

## 2015-01-13 NOTE — Care Management (Signed)
Onsite case manager notified of need for Medicare Observation letter

## 2015-01-13 NOTE — Progress Notes (Signed)
Dr Bary Castilla notified of pt's complaints, wants of Dilaudid and antianxiety meds, , no new orders given

## 2015-01-13 NOTE — Progress Notes (Signed)
[facility]   PATIENT NAME: Shawna Anderson    MR#:  [id number]  DATE OF BIRTH:  12-17-1965  SUBJECTIVE:   No abdominal pain, hungry and wants to go home. NGT removed following scant output. REVIEW OF SYSTEMS:   Review of Systems  Constitutional: Negative for fever and chills.  Respiratory: Negative for cough.   Cardiovascular: Negative for chest pain.  Gastrointestinal: Negative for abdominal pain.  Skin: Negative for rash.  All other systems reviewed and are negative.   DRUG ALLERGIES:   Allergies  Allergen Reactions  . Drixoral Allergy Sinus [Dexbromphen-Pse-Apap Er] Rash    VITALS:  Blood pressure 142/92, pulse 65, temperature 98.6 F (37 C), temperature source Oral, resp. rate 16, height 5\' 3"  (1.6 m), weight 83.008 kg (183 lb), SpO2 93 %.  PHYSICAL EXAMINATION:  GENERAL:  49 y.o.-year-old patient lying in the bed with no acute distress.  EYES: Pupils equal, round, reactive to light and accommodation. No scleral icterus. Extraocular muscles intact.  HEENT: Head atraumatic, normocephalic. Oropharynx and nasopharynx clear.  NECK:  Supple, no jugular venous distention. No thyroid enlargement, no tenderness.  LUNGS: Normal breath sounds bilaterally, no wheezing, rales,rhonchi or crepitation. No use of accessory muscles of respiration.  CARDIOVASCULAR: S1, S2 normal. No murmurs, rubs, or gallops.  ABDOMEN: Soft, nontender, nondistended. Bowel sounds present. No organomegaly or mass. Multiple scars. EXTREMITIES: No pedal edema, cyanosis, or clubbing.  NEUROLOGIC: Cranial nerves II through XII are intact. Muscle strength 5/5 in all extremities. Sensation intact. Gait not checked.  PSYCHIATRIC: The patient is alert and oriented x 3.  SKIN: No obvious rash, lesion, or ulcer.   CBC Latest Ref Rng 01/13/2015 01/12/2015 10/26/2014  WBC 3.6 - 11.0 K/uL 16.5(H) 20.4(H) 11.3(H)  Hemoglobin 12.0 - 16.0 g/dL 13.6 15.0 13.9  Hematocrit 35.0 - 47.0 % 41.9 44.8 41.4   Platelets 150 - 440 K/uL 366 407 342   CMP Latest Ref Rng 01/12/2015 10/26/2014  Glucose 65 - 99 mg/dL 150(H) 96  BUN 6 - 20 mg/dL 14 17  Creatinine 0.44 - 1.00 mg/dL 0.82 0.81  Sodium 135 - 145 mmol/L 141 140  Potassium 3.5 - 5.1 mmol/L 3.8 3.7  Chloride 101 - 111 mmol/L 102 106  CO2 22 - 32 mmol/L 22 27  Calcium 8.9 - 10.3 mg/dL 10.0 9.3  Total Protein 6.5 - 8.1 g/dL 8.5(H) 7.6  Total Bilirubin 0.3 - 1.2 mg/dL 0.4 -  Alkaline Phos 38 - 126 U/L 117 90  AST 15 - 41 U/L 38 24  ALT 14 - 54 U/L 78(H) 35    Personally reviewed the CT scan findings.  I discussed this case in detail and reviewed the films with Dr. Bary Castilla who was covering for our practice last night.      ASSESSMENT AND PLAN:   49 year old obese female with prior complex intra-abdominal surgical history.  There are no clinical signs despite the x-ray findings of intussusception. I will advance her diet reexamine her later today and indeed if she is pain-free and no vomiting she can be discharged. I'm unclear as to the etiology of her white count elevation at this time.

## 2015-01-24 ENCOUNTER — Other Ambulatory Visit: Payer: Self-pay | Admitting: Physician Assistant

## 2015-01-24 DIAGNOSIS — I25119 Atherosclerotic heart disease of native coronary artery with unspecified angina pectoris: Secondary | ICD-10-CM | POA: Diagnosis not present

## 2015-01-24 DIAGNOSIS — R0602 Shortness of breath: Secondary | ICD-10-CM

## 2015-01-24 DIAGNOSIS — F3162 Bipolar disorder, current episode mixed, moderate: Secondary | ICD-10-CM | POA: Diagnosis not present

## 2015-01-24 DIAGNOSIS — I10 Essential (primary) hypertension: Secondary | ICD-10-CM | POA: Diagnosis not present

## 2015-01-24 DIAGNOSIS — F17210 Nicotine dependence, cigarettes, uncomplicated: Secondary | ICD-10-CM | POA: Diagnosis not present

## 2015-01-26 DIAGNOSIS — R079 Chest pain, unspecified: Secondary | ICD-10-CM | POA: Diagnosis not present

## 2015-01-26 DIAGNOSIS — I10 Essential (primary) hypertension: Secondary | ICD-10-CM | POA: Diagnosis not present

## 2015-01-31 DIAGNOSIS — R0602 Shortness of breath: Secondary | ICD-10-CM | POA: Diagnosis not present

## 2015-02-07 ENCOUNTER — Ambulatory Visit: Admission: RE | Admit: 2015-02-07 | Payer: Medicare Other | Source: Ambulatory Visit

## 2015-03-05 ENCOUNTER — Emergency Department: Payer: Medicare Other

## 2015-03-05 ENCOUNTER — Encounter: Payer: Self-pay | Admitting: Emergency Medicine

## 2015-03-05 ENCOUNTER — Emergency Department
Admission: EM | Admit: 2015-03-05 | Discharge: 2015-03-05 | Disposition: A | Discharge status: Home / Self Care | Payer: Medicare Other | Attending: Emergency Medicine | Admitting: Emergency Medicine

## 2015-03-05 DIAGNOSIS — R109 Unspecified abdominal pain: Secondary | ICD-10-CM | POA: Diagnosis not present

## 2015-03-05 DIAGNOSIS — R1084 Generalized abdominal pain: Secondary | ICD-10-CM | POA: Insufficient documentation

## 2015-03-05 DIAGNOSIS — Z79899 Other long term (current) drug therapy: Secondary | ICD-10-CM | POA: Insufficient documentation

## 2015-03-05 DIAGNOSIS — Z7902 Long term (current) use of antithrombotics/antiplatelets: Secondary | ICD-10-CM | POA: Insufficient documentation

## 2015-03-05 DIAGNOSIS — R140 Abdominal distension (gaseous): Secondary | ICD-10-CM | POA: Diagnosis not present

## 2015-03-05 DIAGNOSIS — Z7982 Long term (current) use of aspirin: Secondary | ICD-10-CM | POA: Diagnosis not present

## 2015-03-05 DIAGNOSIS — E119 Type 2 diabetes mellitus without complications: Secondary | ICD-10-CM | POA: Insufficient documentation

## 2015-03-05 DIAGNOSIS — R1110 Vomiting, unspecified: Secondary | ICD-10-CM | POA: Diagnosis not present

## 2015-03-05 DIAGNOSIS — R112 Nausea with vomiting, unspecified: Secondary | ICD-10-CM

## 2015-03-05 DIAGNOSIS — Z720 Tobacco use: Secondary | ICD-10-CM | POA: Insufficient documentation

## 2015-03-05 DIAGNOSIS — I10 Essential (primary) hypertension: Secondary | ICD-10-CM | POA: Diagnosis not present

## 2015-03-05 HISTORY — DX: Gastro-esophageal reflux disease without esophagitis: K21.9

## 2015-03-05 LAB — URINALYSIS COMPLETE WITH MICROSCOPIC (ARMC ONLY)
Bacteria, UA: NONE SEEN
Bilirubin Urine: NEGATIVE
Glucose, UA: NEGATIVE mg/dL
Ketones, ur: NEGATIVE mg/dL
Leukocytes, UA: NEGATIVE
Nitrite: NEGATIVE
Protein, ur: NEGATIVE mg/dL
Specific Gravity, Urine: 1.02 (ref 1.005–1.030)
pH: 6 (ref 5.0–8.0)

## 2015-03-05 LAB — COMPREHENSIVE METABOLIC PANEL
ALT: 86 U/L — ABNORMAL HIGH (ref 14–54)
AST: 77 U/L — ABNORMAL HIGH (ref 15–41)
Albumin: 4.4 g/dL (ref 3.5–5.0)
Alkaline Phosphatase: 124 U/L (ref 38–126)
Anion gap: 9 (ref 5–15)
BUN: 16 mg/dL (ref 6–20)
CO2: 28 mmol/L (ref 22–32)
Calcium: 9.4 mg/dL (ref 8.9–10.3)
Chloride: 104 mmol/L (ref 101–111)
Creatinine, Ser: 0.86 mg/dL (ref 0.44–1.00)
GFR calc Af Amer: >60 mL/min (ref >60)
GFR calc non Af Amer: >60 mL/min (ref >60)
Glucose, Bld: 126 mg/dL — ABNORMAL HIGH (ref 65–99)
Potassium: 3.6 mmol/L (ref 3.5–5.1)
Sodium: 141 mmol/L (ref 135–145)
Total Bilirubin: 0.2 mg/dL — ABNORMAL LOW (ref 0.3–1.2)
Total Protein: 7.6 g/dL (ref 6.5–8.1)

## 2015-03-05 LAB — CBC WITH DIFFERENTIAL/PLATELET
Basophils Absolute: 0.1 10*3/uL (ref 0–0.1)
Basophils Relative: 1 %
Eosinophils Absolute: 0.5 10*3/uL (ref 0–0.7)
Eosinophils Relative: 4 %
HCT: 41.8 % (ref 35.0–47.0)
Hemoglobin: 14.2 g/dL (ref 12.0–16.0)
Lymphocytes Relative: 32 %
Lymphs Abs: 3.4 10*3/uL (ref 1.0–3.6)
MCH: 29.1 pg (ref 26.0–34.0)
MCHC: 34 g/dL (ref 32.0–36.0)
MCV: 85.8 fL (ref 80.0–100.0)
Monocytes Absolute: 0.8 10*3/uL (ref 0.2–0.9)
Monocytes Relative: 8 %
Neutro Abs: 6 10*3/uL (ref 1.4–6.5)
Neutrophils Relative %: 55 %
Platelets: 324 10*3/uL (ref 150–440)
RBC: 4.88 MIL/uL (ref 3.80–5.20)
RDW: 13.4 % (ref 11.5–14.5)
WBC: 10.8 10*3/uL (ref 3.6–11.0)

## 2015-03-05 LAB — PROTIME-INR
INR: 0.89
Prothrombin Time: 12.2 seconds (ref 11.4–15.0)

## 2015-03-05 LAB — LIPASE, BLOOD: Lipase: 30 U/L (ref 22–51)

## 2015-03-05 MED ORDER — TRAMADOL HCL 50 MG PO TABS: 50.0000 mg | ORAL_TABLET | Freq: Four times a day (QID) | ORAL | Status: DC | PRN

## 2015-03-05 MED ORDER — IOHEXOL 240 MG/ML SOLN
25.0000 mL | Freq: Once | INTRAMUSCULAR | Status: AC | PRN
  Administered 2015-03-05: 25 mL via ORAL

## 2015-03-05 MED ORDER — ONDANSETRON HCL 4 MG/2ML IJ SOLN
4.0000 mg | Freq: Once | INTRAMUSCULAR | Status: AC
  Administered 2015-03-05: 4 mg via INTRAVENOUS
  Filled 2015-03-05: qty 2

## 2015-03-05 MED ORDER — PROMETHAZINE HCL 25 MG/ML IJ SOLN
25.0000 mg | Freq: Once | INTRAMUSCULAR | Status: AC
  Administered 2015-03-05: 25 mg via INTRAVENOUS

## 2015-03-05 MED ORDER — FENTANYL CITRATE (PF) 100 MCG/2ML IJ SOLN
50.0000 ug | Freq: Once | INTRAMUSCULAR | Status: AC
  Administered 2015-03-05: 50 ug via INTRAVENOUS
  Filled 2015-03-05: qty 2

## 2015-03-05 MED ORDER — IOHEXOL 300 MG/ML  SOLN
125.0000 mL | Freq: Once | INTRAMUSCULAR | Status: AC | PRN
  Administered 2015-03-05: 125 mL via INTRAVENOUS

## 2015-03-05 MED ORDER — DICYCLOMINE HCL 10 MG PO CAPS: 10.0000 mg | ORAL_CAPSULE | Freq: Four times a day (QID) | ORAL | Status: DC

## 2015-03-05 MED ORDER — MORPHINE SULFATE (PF) 4 MG/ML IV SOLN
4.0000 mg | Freq: Once | INTRAVENOUS | Status: AC
  Administered 2015-03-05: 4 mg via INTRAVENOUS
  Filled 2015-03-05: qty 1

## 2015-03-05 MED ORDER — PROMETHAZINE HCL 25 MG RE SUPP: 25.0000 mg | Freq: Four times a day (QID) | RECTAL | Status: DC | PRN

## 2015-03-05 MED ORDER — PROMETHAZINE HCL 25 MG/ML IJ SOLN
INTRAMUSCULAR | Status: AC
  Administered 2015-03-05: 25 mg via INTRAVENOUS
  Filled 2015-03-05: qty 1

## 2015-03-05 MED ORDER — ONDANSETRON HCL 4 MG/2ML IJ SOLN
4.0000 mg | Freq: Once | INTRAMUSCULAR | Status: AC
Start: 2015-03-05 — End: 2015-03-05
  Administered 2015-03-05: 4 mg via INTRAVENOUS
  Filled 2015-03-05: qty 2

## 2015-03-05 MED ORDER — SODIUM CHLORIDE 0.9 % IV BOLUS (SEPSIS)
1000.0000 mL | Freq: Once | INTRAVENOUS | Status: AC
  Administered 2015-03-05: 1000 mL via INTRAVENOUS

## 2015-03-05 NOTE — ED Provider Notes (Signed)
[facility] Emergency Department Provider Note  ____________________________________________  Time seen: Approximately 608 AM  I have reviewed the triage vital signs and the nursing notes.   HISTORY  Chief Complaint Abdominal Pain    HPI Shawna Anderson is a 49 y.o. female is a 49 year old female who comes in with abdominal pain and distention for the last 3 days. The patient reports that she has been waking up vomiting each day. She reports severe pain on the left side of her abdomen. She has had constant problems with her abdomen before but reports that she is unable to tolerate this pain. The patient reports that she would typically be given Dilaudid for this pain whenever she has it because nothing else helps.The patient reports that when she vomits it looks brown green. She reports that she does move her bowels every day but it has been difficult and she thinks she may have hemorrhoids. The patient reports the pain as a 10 out of 10 in intensity. She also reports that in the past for but also been ruptured with this pain. Patient denies any chest pain or shortness of breath was concerned she could not tolerate the pain so she decided to come in for evaluation.   Past Medical History  Diagnosis Date  . Bipolar 1 disorder   . Depression   . Asthma   . Diabetes mellitus without complication   . Hypertension   . GERD (gastroesophageal reflux disease)     Patient Active Problem List   Diagnosis Date Noted  . Abdominal pain 01/12/2015  . Vomiting 01/12/2015  . Nausea and vomiting 01/12/2015    Past Surgical History  Procedure Laterality Date  . Abdominal surgery  1995    Bowel resection.  . Coronary angioplasty with stent placement  2010    Drug eluting stent  . Cardiac surgery    . Abdominal hysterectomy      Current Outpatient Rx  Name  Route  Sig  Dispense  Refill  . albuterol (PROVENTIL HFA;VENTOLIN HFA) 108 (90 BASE) MCG/ACT inhaler  Inhalation   Inhale 2 puffs into the lungs every 4 (four) hours as needed for wheezing or shortness of breath.         Marland Kitchen aspirin 81 MG tablet   Oral   Take 81 mg by mouth daily.         . clopidogrel (PLAVIX) 75 MG tablet   Oral   Take 75 mg by mouth daily.         . divalproex (DEPAKOTE ER) 500 MG 24 hr tablet   Oral   Take 500-1,000 mg by mouth 4 (four) times daily. 1 tablet AM, NOON, PM and 2 at bedtime         . famotidine (PEPCID) 40 MG tablet   Oral   Take 40 mg by mouth daily.         . Fluticasone Furoate-Vilanterol (BREO ELLIPTA IN)   Inhalation   Inhale 1 puff into the lungs 2 (two) times daily.         Marland Kitchen lisinopril (PRINIVIL,ZESTRIL) 10 MG tablet   Oral   Take 10 mg by mouth daily.         . metoprolol (LOPRESSOR) 100 MG tablet   Oral   Take 100 mg by mouth 2 (two) times daily.         . pantoprazole (PROTONIX) 40 MG tablet   Oral   Take 40 mg by mouth daily.         Marland Kitchen  sertraline (ZOLOFT) 50 MG tablet   Oral   Take 50 mg by mouth daily.         . traZODone (DESYREL) 150 MG tablet   Oral   Take 150 mg by mouth at bedtime.           Allergies Drixoral allergy sinus  History reviewed. No pertinent family history.  Social History Social History  Substance Use Topics  . Smoking status: Current Every Day Smoker -- 0.50 packs/day    Types: Cigarettes  . Smokeless tobacco: Never Used  . Alcohol Use: Yes    Review of Systems Constitutional: No fever/chills Eyes: No visual changes. ENT: No sore throat. Cardiovascular: Denies chest pain. Respiratory: Denies shortness of breath. Gastrointestinal:  abdominal pain.  nausea,vomiting.  No diarrhea.  No constipation. Genitourinary: Negative for dysuria. Musculoskeletal: Negative for back pain. Skin: Negative for rash. Neurological: Negative for headaches, focal weakness or numbness.  10-point ROS otherwise negative.  ____________________________________________   PHYSICAL  EXAM:  VITAL SIGNS: ED Triage Vitals  Enc Vitals Group     BP 03/05/15 0448 168/98 mmHg     Pulse Rate 03/05/15 0448 85     Resp 03/05/15 0448 22     Temp 03/05/15 0448 98.1 F (36.7 C)     Temp Source 03/05/15 0448 Oral     SpO2 03/05/15 0448 98 %     Weight 03/05/15 0448 188 lb (85.276 kg)     Height 03/05/15 0448 5\' 3"  (1.6 m)     Head Cir --      Peak Flow --      Pain Score 03/05/15 0506 10     Pain Loc --      Pain Edu? --      Excl. in [place]? --     Constitutional: Alert and oriented. Well appearing and in moderate distress. Eyes: Conjunctivae are normal. PERRL. EOMI. Head: Atraumatic. Nose: No congestion/rhinnorhea. Mouth/Throat: Mucous membranes are moist.  Oropharynx non-erythematous. Cardiovascular: Normal rate, regular rhythm. Grossly normal heart sounds.  Good peripheral circulation. Respiratory: Normal respiratory effort.  No retractions. Lungs CTAB. Gastrointestinal: Soft with left-sided tenderness to palpation. Mild distention. Positive bowel sounds Musculoskeletal: No lower extremity tenderness nor edema.   Neurologic:  Normal speech and language.  Skin:  Skin is warm, dry and intact.  Psychiatric: Patient very anxious  ____________________________________________   LABS (all labs ordered are listed, but only abnormal results are displayed)  Labs Reviewed  COMPREHENSIVE METABOLIC PANEL - Abnormal; Notable for the following:    Glucose, Bld 126 (*)    AST 77 (*)    ALT 86 (*)    Total Bilirubin 0.2 (*)    All other components within normal limits  URINALYSIS COMPLETEWITH MICROSCOPIC ([facility] ONLY) - Abnormal; Notable for the following:    Color, Urine YELLOW (*)    APPearance HAZY (*)    Hgb urine dipstick 1+ (*)    Squamous Epithelial / LPF 6-30 (*)    All other components within normal limits  CBC WITH DIFFERENTIAL/PLATELET  LIPASE, BLOOD  PROTIME-INR   ____________________________________________  EKG  ED ECG REPORT I, Loney Hering,  the attending physician, personally viewed and interpreted this ECG.   Date: 03/05/2015  EKG Time: 509  Rate: 74  Rhythm: normal sinus rhythm  Axis: normal  Intervals:none  ST&T Change: none  ____________________________________________  RADIOLOGY  CT abdomen and pelvis ____________________________________________   PROCEDURES  Procedure(s) performed: None  Critical Care performed: No  ____________________________________________  INITIAL IMPRESSION / ASSESSMENT AND PLAN / ED COURSE  Pertinent labs & imaging results that were available during my care of the patient were reviewed by me and considered in my medical decision making (see chart for details).  This is a 49 year old female with a history of bowel resection and chronic abdominal pain and comes in with severe left-sided abdominal pain today. The patient reports that she's had this pain for 3 days as well as some distention of her abdomen which is not common for her. The patient did not take any medication at home but came in right away for evaluation and treatment of her pain. I will give the patient liter of normal saline, some Zofran as well as some morphine. The patient will receive a CT to evaluate possible cause of her symptoms.  The patient's care will be signed out the Dr. Francene Castle who will follow up the results of the patient's CT scan and reassess the patient.  ____________________________________________   FINAL CLINICAL IMPRESSION(S) / ED DIAGNOSES  Final diagnoses:  Generalized abdominal pain      Loney Hering, MD 03/05/15 [phone]

## 2015-03-05 NOTE — Discharge Instructions (Signed)
Abdominal Pain °Many things can cause abdominal pain. Usually, abdominal pain is not caused by a disease and will improve without treatment. It can often be observed and treated at home. Your health care provider will do a physical exam and possibly order blood tests and X-rays to help determine the seriousness of your pain. However, in many cases, more time must pass before a clear cause of the pain can be found. Before that point, your health care provider may not know if you need more testing or further treatment. °HOME CARE INSTRUCTIONS  °Monitor your abdominal pain for any changes. The following actions may help to alleviate any discomfort you are experiencing: °· Only take over-the-counter or prescription medicines as directed by your health care provider. °· Do not take laxatives unless directed to do so by your health care provider. °· Try a clear liquid diet (broth, tea, or water) as directed by your health care provider. Slowly move to a bland diet as tolerated. °SEEK MEDICAL CARE IF: °· You have unexplained abdominal pain. °· You have abdominal pain associated with nausea or diarrhea. °· You have pain when you urinate or have a bowel movement. °· You experience abdominal pain that wakes you in the night. °· You have abdominal pain that is worsened or improved by eating food. °· You have abdominal pain that is worsened with eating fatty foods. °· You have a fever. °SEEK IMMEDIATE MEDICAL CARE IF:  °· Your pain does not go away within 2 hours. °· You keep throwing up (vomiting). °· Your pain is felt only in portions of the abdomen, such as the right side or the left lower portion of the abdomen. °· You pass bloody or black tarry stools. °MAKE SURE YOU: °· Understand these instructions.   °· Will watch your condition.   °· Will get help right away if you are not doing well or get worse.   °Document Released: 04/09/2005 Document Revised: 07/05/2013 Document Reviewed: 03/09/2013 °[employer]® Patient Information  ©2015 [employer]. This information is not intended to replace advice given to you by your health care provider. Make sure you discuss any questions you have with your health care provider. ° °Nausea and Vomiting °Nausea is a sick feeling that often comes before throwing up (vomiting). Vomiting is a reflex where stomach contents come out of your mouth. Vomiting can cause severe loss of body fluids (dehydration). Children and elderly adults can become dehydrated quickly, especially if they also have diarrhea. Nausea and vomiting are symptoms of a condition or disease. It is important to find the cause of your symptoms. °CAUSES  °· Direct irritation of the stomach lining. This irritation can result from increased acid production (gastroesophageal reflux disease), infection, food poisoning, taking certain medicines (such as nonsteroidal anti-inflammatory drugs), alcohol use, or tobacco use. °· Signals from the brain. These signals could be caused by a headache, heat exposure, an inner ear disturbance, increased pressure in the brain from injury, infection, a tumor, or a concussion, pain, emotional stimulus, or metabolic problems. °· An obstruction in the gastrointestinal tract (bowel obstruction). °· Illnesses such as diabetes, hepatitis, gallbladder problems, appendicitis, kidney problems, cancer, sepsis, atypical symptoms of a heart attack, or eating disorders. °· Medical treatments such as chemotherapy and radiation. °· Receiving medicine that makes you sleep (general anesthetic) during surgery. °DIAGNOSIS °Your caregiver may ask for tests to be done if the problems do not improve after a few days. Tests may also be done if symptoms are severe or if the reason for the nausea   and vomiting is not clear. Tests may include: °· Urine tests. °· Blood tests. °· Stool tests. °· Cultures (to look for evidence of infection). °· X-rays or other imaging studies. °Test results can help your caregiver make decisions about  treatment or the need for additional tests. °TREATMENT °You need to stay well hydrated. Drink frequently but in small amounts. You may wish to drink water, sports drinks, clear broth, or eat frozen ice pops or gelatin dessert to help stay hydrated. When you eat, eating slowly may help prevent nausea. There are also some antinausea medicines that may help prevent nausea. °HOME CARE INSTRUCTIONS  °· Take all medicine as directed by your caregiver. °· If you do not have an appetite, do not force yourself to eat. However, you must continue to drink fluids. °· If you have an appetite, eat a normal diet unless your caregiver tells you differently. °¨ Eat a variety of complex carbohydrates (rice, wheat, potatoes, bread), lean meats, yogurt, fruits, and vegetables. °¨ Avoid high-fat foods because they are more difficult to digest. °· Drink enough water and fluids to keep your urine clear or pale yellow. °· If you are dehydrated, ask your caregiver for specific rehydration instructions. Signs of dehydration may include: °¨ Severe thirst. °¨ Dry lips and mouth. °¨ Dizziness. °¨ Dark urine. °¨ Decreasing urine frequency and amount. °¨ Confusion. °¨ Rapid breathing or pulse. °SEEK IMMEDIATE MEDICAL CARE IF:  °· You have blood or brown flecks (like coffee grounds) in your vomit. °· You have black or bloody stools. °· You have a severe headache or stiff neck. °· You are confused. °· You have severe abdominal pain. °· You have chest pain or trouble breathing. °· You do not urinate at least once every 8 hours. °· You develop cold or clammy skin. °· You continue to vomit for longer than 24 to 48 hours. °· You have a fever. °MAKE SURE YOU:  °· Understand these instructions. °· Will watch your condition. °· Will get help right away if you are not doing well or get worse. °Document Released: 06/30/2005 Document Revised: 09/22/2011 Document Reviewed: 11/27/2010 °Exit[employer]® Patient Information ©2015 [employer]. This information is not  intended to replace advice given to you by your health care provider. Make sure you discuss any questions you have with your health care provider. ° °

## 2015-03-05 NOTE — ED Provider Notes (Signed)
The results of the patient's CT scan returned prior to my leaving the emergency department.  CT abdomen and pelvis: No evidence of bowel obstruction, normal appendix, prior partial left hemicolectomy, no colonic wall thickening or inflammatory changes.  The patient's CT scan is unremarkable. She did receive a dose of morphine as well as some fluid and Phenergan. The patient be discharged home to follow-up with GI to further evaluate her chronic abdominal pain. I will give the patient some Bentyl for the bloating that she's having as well as some tramadol and Phenergan suppositories.  Loney Hering, MD 03/05/15 [phone]

## 2015-03-05 NOTE — ED Notes (Signed)
MD at bedside. 

## 2015-03-05 NOTE — ED Notes (Signed)
Pt via POV with c/o left mid abdominal pain radiating to left flank and distension for the the last 3 days with nausea and vomiting.  Hx of cardiac stent and bowel resection in 1998.  Pt is anxious and retching in triage via wheelchair.

## 2015-03-05 NOTE — ED Notes (Signed)
Pt states pain still present. And dilaudid works best.

## 2015-03-05 NOTE — ED Notes (Addendum)
Pt c/o abd distention for several days accompanied by difficulty in having a bowel movement; nausea/vomiting daily when waking in the am; pt says the only medication that help her pain is Dilaudid

## 2015-09-13 DIAGNOSIS — F319 Bipolar disorder, unspecified: Secondary | ICD-10-CM | POA: Diagnosis not present

## 2015-09-26 DIAGNOSIS — F319 Bipolar disorder, unspecified: Secondary | ICD-10-CM | POA: Diagnosis not present

## 2015-12-22 ENCOUNTER — Emergency Department: Payer: Medicare Other

## 2015-12-22 ENCOUNTER — Emergency Department
Admission: EM | Admit: 2015-12-22 | Discharge: 2015-12-22 | Disposition: A | Discharge status: Home / Self Care | Payer: Medicare Other | Attending: Student | Admitting: Student

## 2015-12-22 DIAGNOSIS — F1290 Cannabis use, unspecified, uncomplicated: Secondary | ICD-10-CM | POA: Insufficient documentation

## 2015-12-22 DIAGNOSIS — E119 Type 2 diabetes mellitus without complications: Secondary | ICD-10-CM | POA: Diagnosis not present

## 2015-12-22 DIAGNOSIS — R079 Chest pain, unspecified: Secondary | ICD-10-CM | POA: Diagnosis not present

## 2015-12-22 DIAGNOSIS — B349 Viral infection, unspecified: Secondary | ICD-10-CM | POA: Diagnosis not present

## 2015-12-22 DIAGNOSIS — F17210 Nicotine dependence, cigarettes, uncomplicated: Secondary | ICD-10-CM | POA: Insufficient documentation

## 2015-12-22 DIAGNOSIS — M62838 Other muscle spasm: Secondary | ICD-10-CM | POA: Insufficient documentation

## 2015-12-22 DIAGNOSIS — Z79899 Other long term (current) drug therapy: Secondary | ICD-10-CM | POA: Diagnosis not present

## 2015-12-22 DIAGNOSIS — Z7982 Long term (current) use of aspirin: Secondary | ICD-10-CM | POA: Diagnosis not present

## 2015-12-22 DIAGNOSIS — I2510 Atherosclerotic heart disease of native coronary artery without angina pectoris: Secondary | ICD-10-CM | POA: Diagnosis not present

## 2015-12-22 DIAGNOSIS — F319 Bipolar disorder, unspecified: Secondary | ICD-10-CM | POA: Insufficient documentation

## 2015-12-22 DIAGNOSIS — R059 Cough, unspecified: Secondary | ICD-10-CM

## 2015-12-22 DIAGNOSIS — J45909 Unspecified asthma, uncomplicated: Secondary | ICD-10-CM | POA: Insufficient documentation

## 2015-12-22 DIAGNOSIS — I10 Essential (primary) hypertension: Secondary | ICD-10-CM | POA: Insufficient documentation

## 2015-12-22 DIAGNOSIS — R05 Cough: Secondary | ICD-10-CM

## 2015-12-22 LAB — BASIC METABOLIC PANEL
Anion gap: 9 (ref 5–15)
BUN: 18 mg/dL (ref 6–20)
CO2: 23 mmol/L (ref 22–32)
Calcium: 8.8 mg/dL — ABNORMAL LOW (ref 8.9–10.3)
Chloride: 107 mmol/L (ref 101–111)
Creatinine, Ser: 0.71 mg/dL (ref 0.44–1.00)
GFR calc Af Amer: >60 mL/min (ref >60)
GFR calc non Af Amer: >60 mL/min (ref >60)
Glucose, Bld: 109 mg/dL — ABNORMAL HIGH (ref 65–99)
Potassium: 3.8 mmol/L (ref 3.5–5.1)
Sodium: 139 mmol/L (ref 135–145)

## 2015-12-22 LAB — CBC WITH DIFFERENTIAL/PLATELET
Basophils Absolute: 0.2 10*3/uL — ABNORMAL HIGH (ref 0–0.1)
Basophils Relative: 1 %
Eosinophils Absolute: 0.6 10*3/uL (ref 0–0.7)
Eosinophils Relative: 4 %
HCT: 40 % (ref 35.0–47.0)
Hemoglobin: 13.3 g/dL (ref 12.0–16.0)
Lymphocytes Relative: 24 %
Lymphs Abs: 3.3 10*3/uL (ref 1.0–3.6)
MCH: 28.8 pg (ref 26.0–34.0)
MCHC: 33.3 g/dL (ref 32.0–36.0)
MCV: 86.5 fL (ref 80.0–100.0)
Monocytes Absolute: 1 10*3/uL — ABNORMAL HIGH (ref 0.2–0.9)
Monocytes Relative: 7 %
Neutro Abs: 8.7 10*3/uL — ABNORMAL HIGH (ref 1.4–6.5)
Neutrophils Relative %: 64 %
Platelets: 346 10*3/uL (ref 150–440)
RBC: 4.62 MIL/uL (ref 3.80–5.20)
RDW: 14.5 % (ref 11.5–14.5)
WBC: 13.8 10*3/uL — ABNORMAL HIGH (ref 3.6–11.0)

## 2015-12-22 LAB — POCT RAPID STREP A: Streptococcus, Group A Screen (Direct): NEGATIVE

## 2015-12-22 LAB — URINALYSIS COMPLETE WITH MICROSCOPIC (ARMC ONLY)
Bilirubin Urine: NEGATIVE
Glucose, UA: NEGATIVE mg/dL
Hgb urine dipstick: NEGATIVE
Ketones, ur: NEGATIVE mg/dL
Leukocytes, UA: NEGATIVE
Nitrite: NEGATIVE
Protein, ur: NEGATIVE mg/dL
Specific Gravity, Urine: 1.025 (ref 1.005–1.030)
pH: 5 (ref 5.0–8.0)

## 2015-12-22 LAB — TROPONIN I: Troponin I: <0.03 ng/mL (ref <0.031)

## 2015-12-22 LAB — CK: Total CK: 118 U/L (ref 38–234)

## 2015-12-22 MED ORDER — HYDROMORPHONE HCL 1 MG/ML IJ SOLN
1.0000 mg | Freq: Once | INTRAMUSCULAR | Status: AC
  Administered 2015-12-22: 1 mg via INTRAVENOUS
  Filled 2015-12-22: qty 1

## 2015-12-22 MED ORDER — SODIUM CHLORIDE 0.9 % IV BOLUS (SEPSIS)
1000.0000 mL | Freq: Once | INTRAVENOUS | Status: AC
  Administered 2015-12-22: 1000 mL via INTRAVENOUS

## 2015-12-22 MED ORDER — DIAZEPAM 5 MG/ML IJ SOLN
2.5000 mg | Freq: Once | INTRAMUSCULAR | Status: AC
  Administered 2015-12-22: 2.5 mg via INTRAVENOUS
  Filled 2015-12-22: qty 2

## 2015-12-22 MED ORDER — KETOROLAC TROMETHAMINE 30 MG/ML IJ SOLN
15.0000 mg | Freq: Once | INTRAMUSCULAR | Status: AC
  Administered 2015-12-22: 15 mg via INTRAVENOUS
  Filled 2015-12-22: qty 1

## 2015-12-22 NOTE — ED Provider Notes (Signed)
[facility] Emergency Department Provider Note   ____________________________________________  Time seen: Approximately 7:45 PM  I have reviewed the triage vital signs and the nursing notes.   HISTORY  Chief Complaint Spasms    HPI Shawna Anderson is a 50 y.o. female with history of chronic artery disease as post stents, hypertension, diabetes, asthma, bipolar disorder and GERD who presents for evaluation of 2-3 days sore throat and cough as well as muscle spasms in her abdomen chest and back, gradual onset, intermittent, and no modifying factors. She denies any fevers or chills. No chest pain. Earlier this week she did have some shortness of breath which resolved with use of her albuterol. No pain or burning with urination.   Past Medical History  Diagnosis Date  . Bipolar 1 disorder ([place])   . Depression   . Asthma   . Diabetes mellitus without complication ([place])   . Hypertension   . GERD (gastroesophageal reflux disease)     Patient Active Problem List   Diagnosis Date Noted  . Abdominal pain 01/12/2015  . Vomiting 01/12/2015  . Nausea and vomiting 01/12/2015    Past Surgical History  Procedure Laterality Date  . Abdominal surgery  1995    Bowel resection.  . Coronary angioplasty with stent placement  2010    Drug eluting stent  . Cardiac surgery    . Abdominal hysterectomy      Current Outpatient Rx  Name  Route  Sig  Dispense  Refill  . albuterol (PROVENTIL HFA;VENTOLIN HFA) 108 (90 BASE) MCG/ACT inhaler   Inhalation   Inhale 2 puffs into the lungs every 4 (four) hours as needed for wheezing or shortness of breath.         Marland Kitchen aspirin 81 MG tablet   Oral   Take 81 mg by mouth daily.         . clopidogrel (PLAVIX) 75 MG tablet   Oral   Take 75 mg by mouth daily.         Marland Kitchen EXPIRED: dicyclomine (BENTYL) 10 MG capsule   Oral   Take 1 capsule (10 mg total) by mouth 4 (four) times daily.   20 capsule   0   . divalproex  (DEPAKOTE ER) 500 MG 24 hr tablet   Oral   Take 500-1,000 mg by mouth 4 (four) times daily. 1 tablet AM, NOON, PM and 2 at bedtime         . famotidine (PEPCID) 40 MG tablet   Oral   Take 40 mg by mouth daily.         . Fluticasone Furoate-Vilanterol (BREO ELLIPTA IN)   Inhalation   Inhale 1 puff into the lungs 2 (two) times daily.         Marland Kitchen lisinopril (PRINIVIL,ZESTRIL) 10 MG tablet   Oral   Take 10 mg by mouth daily.         . metoprolol (LOPRESSOR) 100 MG tablet   Oral   Take 100 mg by mouth 2 (two) times daily.         . pantoprazole (PROTONIX) 40 MG tablet   Oral   Take 40 mg by mouth daily.         . promethazine (PHENERGAN) 25 MG suppository   Rectal   Place 1 suppository (25 mg total) rectally every 6 (six) hours as needed for nausea.   12 suppository   0   . sertraline (ZOLOFT) 50 MG tablet   Oral  Take 50 mg by mouth daily.         . traMADol (ULTRAM) 50 MG tablet   Oral   Take 1 tablet (50 mg total) by mouth every 6 (six) hours as needed.   12 tablet   0   . traZODone (DESYREL) 150 MG tablet   Oral   Take 150 mg by mouth at bedtime.           Allergies Drixoral allergy sinus  No family history on file.  Social History Social History  Substance Use Topics  . Smoking status: Current Every Day Smoker -- 0.50 packs/day    Types: Cigarettes  . Smokeless tobacco: Never Used  . Alcohol Use: Yes    Review of Systems Constitutional: No fever/chills Eyes: No visual changes. ENT: No sore throat. Cardiovascular: Denies chest pain. Respiratory: Denies shortness of breath. Gastrointestinal: No abdominal pain.  No nausea, no vomiting.  No diarrhea.  No constipation. Genitourinary: Negative for dysuria. Musculoskeletal: + for diffuse muscle cramps. Skin: Negative for rash. Neurological: Negative for headaches, focal weakness or numbness.  10-point ROS otherwise negative.  ____________________________________________   PHYSICAL  EXAM:  VITAL SIGNS: ED Triage Vitals  Enc Vitals Group     BP 12/22/15 1837 215/100 mmHg     Pulse Rate 12/22/15 1837 112     Resp 12/22/15 1837 20     Temp 12/22/15 1837 98.7 F (37.1 C)     Temp Source 12/22/15 1837 Oral     SpO2 12/22/15 1837 97 %     Weight 12/22/15 1837 185 lb (83.915 kg)     Height 12/22/15 1837 5\' 3"  (1.6 m)     Head Cir --      Peak Flow --      Pain Score 12/22/15 1837 10     Pain Loc --      Pain Edu? --      Excl. in [place]? --     Constitutional: Alert and oriented. In mild distress due to cramping. Eyes: Conjunctivae are normal. PERRL. EOMI. Head: Atraumatic. Nose: No congestion/rhinnorhea. Mouth/Throat: Mucous membranes are moist.  Oropharynx mildly erythematous without exudate, no uvular deviation, no peritonsillar abscess.. Neck: No stridor. Supple without meningismus.  Cardiovascular: Mildly tachycardic rate, regular rhythm. Grossly normal heart sounds.  Good peripheral circulation. Respiratory: Normal respiratory effort.  No retractions. Lungs CTAB. Gastrointestinal: Soft and nontender. No distention. No CVA tenderness. Genitourinary: deferred Musculoskeletal: No lower extremity tenderness nor edema.  No joint effusions. Neurologic:  Normal speech and language. No gross focal neurologic deficits are appreciated. No gait instability. Skin:  Skin is warm, dry and intact. No rash noted. Psychiatric: Mood and affect are normal. Speech and behavior are normal.  ____________________________________________   LABS (all labs ordered are listed, but only abnormal results are displayed)  Labs Reviewed  CBC WITH DIFFERENTIAL/PLATELET - Abnormal; Notable for the following:    WBC 13.8 (*)    Neutro Abs 8.7 (*)    Monocytes Absolute 1.0 (*)    Basophils Absolute 0.2 (*)    All other components within normal limits  BASIC METABOLIC PANEL - Abnormal; Notable for the following:    Glucose, Bld 109 (*)    Calcium 8.8 (*)    All other components within  normal limits  URINALYSIS COMPLETEWITH MICROSCOPIC ([facility] ONLY) - Abnormal; Notable for the following:    Color, Urine YELLOW (*)    APPearance CLEAR (*)    Bacteria, UA RARE (*)    Squamous Epithelial /  LPF 0-5 (*)    All other components within normal limits  CK  TROPONIN I  POCT RAPID STREP A   ____________________________________________  EKG  ED ECG REPORT I, Joanne Gavel, the attending physician, personally viewed and interpreted this ECG.   Date: 12/22/2015  EKG Time: 20:02  Rate: 95  Rhythm: normal sinus rhythm  Axis: left  Intervals:none  ST&T Change: No acute ST elevation.  ____________________________________________  RADIOLOGY  CXR IMPRESSION: No acute abnormality.  ____________________________________________   PROCEDURES  Procedure(s) performed: None  Critical Care performed: No  ____________________________________________   INITIAL IMPRESSION / ASSESSMENT AND PLAN / ED COURSE  Pertinent labs & imaging results that were available during my care of the patient were reviewed by me and considered in my medical decision making (see chart for details).  Shawna Anderson is a 50 y.o. female with history of chronic artery disease as post stents, hypertension, diabetes, asthma, bipolar disorder and GERD who presents for evaluation of 2-3 days sore throat and cough as well as muscle spasms in her abdomen chest and back. On exam, she is in mild distress due to cramping. Her vital signs are notable for mild tachycardia, she is hypertensive at 215/100 however this improved to 180/102 without any intervention. She has not taken any of her antihypertensive medications for several days. I suspect she has a viral illness, she may be dehydrated secondary to that and that certainly could be causing her cramping. We will obtain screening labs, EKG, CK and treat her symptomatically with Toradol, fluids, benzodiazepines and reassess for  disposition.  ----------------------------------------- 8:46 PM on 12/22/2015 ----------------------------------------- EKG reassuring. I reviewed the patient's labs which are notable for mild leukocytosis. Unremarkable BMP, normal CK. Urinalysis is not consistent with infection. Negative troponin. Chest x-ray shows only bronchitic changes. She is continued to complain of diffuse cramping and is specifically requesting morphine or Dilaudid which she reports that she has had in the past for other pain complaints and they been helpful for her. I ordered a milligram of Dilaudid. I anticipate that once her pain improves she can likely be discharged with close outpatient follow-up. Her strep test was negative. Care transferred to Dr. Jacqualine Code at this time pending reassessment.  ____________________________________________   FINAL CLINICAL IMPRESSION(S) / ED DIAGNOSES  Final diagnoses:  Cough  Muscle spasm  Viral syndrome      NEW MEDICATIONS STARTED DURING THIS VISIT:  New Prescriptions   No medications on file     Note:  This document was prepared using Dragon voice recognition software and may include unintentional dictation errors.    Joanne Gavel, MD 12/22/15 2105

## 2015-12-22 NOTE — ED Notes (Signed)
Strep Test done; Negative Result

## 2015-12-22 NOTE — ED Provider Notes (Signed)
Patient reports feeling much improved. She reports her throat discomfort is much better. She's been able to eat here. Patient awake and alert, understands instructions no driving tonight. Discharge patient home.  Alert, well oriented. IMproved.  Filed Vitals:   12/22/15 2107 12/22/15 2216  BP: 154/93 155/89  Pulse: 82 80  Temp:    Resp: 18 16     Delman Kitten, MD 12/22/15 [phone]

## 2015-12-22 NOTE — ED Notes (Signed)
Patient given ice chips per Dr. Jacqualine Code.

## 2015-12-22 NOTE — ED Notes (Signed)
Patient reports decreased in throat pain with the ice chips.

## 2015-12-22 NOTE — ED Notes (Addendum)
Pt reports ED w/ c/o muscle spasms to abd/back/chest and sore throat since Wednesday. Pt sts that she woke this AM and had swelling to "glands". Pt denies n/v/d at this time.  Pt ambulatory to triage, skin WNL.  Pt HTN in triage, sts that she has not taken BP meds in "months and months".  Pt moaning, stating "I can't take this pain, Jesus help me".

## 2015-12-22 NOTE — ED Notes (Signed)
Patient advised to wait in lobby until her son arrives to pick her up.

## 2015-12-22 NOTE — ED Notes (Signed)
When asked, patient states she drove herself here, but can call her son for a ride home. Dr. Edd Fabian aware.

## 2016-03-29 DIAGNOSIS — J029 Acute pharyngitis, unspecified: Secondary | ICD-10-CM | POA: Diagnosis not present

## 2016-03-29 DIAGNOSIS — J039 Acute tonsillitis, unspecified: Secondary | ICD-10-CM | POA: Diagnosis not present

## 2016-03-30 ENCOUNTER — Emergency Department
Admission: EM | Admit: 2016-03-30 | Discharge: 2016-03-30 | Disposition: A | Discharge status: Home / Self Care | Payer: Medicare Other | Attending: Emergency Medicine | Admitting: Emergency Medicine

## 2016-03-30 ENCOUNTER — Emergency Department: Payer: Medicare Other

## 2016-03-30 DIAGNOSIS — I10 Essential (primary) hypertension: Secondary | ICD-10-CM | POA: Insufficient documentation

## 2016-03-30 DIAGNOSIS — J039 Acute tonsillitis, unspecified: Secondary | ICD-10-CM

## 2016-03-30 DIAGNOSIS — J45909 Unspecified asthma, uncomplicated: Secondary | ICD-10-CM | POA: Insufficient documentation

## 2016-03-30 DIAGNOSIS — E119 Type 2 diabetes mellitus without complications: Secondary | ICD-10-CM | POA: Insufficient documentation

## 2016-03-30 DIAGNOSIS — Z79899 Other long term (current) drug therapy: Secondary | ICD-10-CM | POA: Insufficient documentation

## 2016-03-30 DIAGNOSIS — J36 Peritonsillar abscess: Secondary | ICD-10-CM | POA: Insufficient documentation

## 2016-03-30 DIAGNOSIS — F17210 Nicotine dependence, cigarettes, uncomplicated: Secondary | ICD-10-CM | POA: Insufficient documentation

## 2016-03-30 DIAGNOSIS — J029 Acute pharyngitis, unspecified: Secondary | ICD-10-CM | POA: Diagnosis present

## 2016-03-30 LAB — BASIC METABOLIC PANEL
Anion gap: 4 — ABNORMAL LOW (ref 5–15)
BUN: 9 mg/dL (ref 6–20)
CO2: 25 mmol/L (ref 22–32)
Calcium: 8.9 mg/dL (ref 8.9–10.3)
Chloride: 110 mmol/L (ref 101–111)
Creatinine, Ser: 0.69 mg/dL (ref 0.44–1.00)
GFR calc Af Amer: >60 mL/min (ref >60)
GFR calc non Af Amer: >60 mL/min (ref >60)
Glucose, Bld: 116 mg/dL — ABNORMAL HIGH (ref 65–99)
Potassium: 3.7 mmol/L (ref 3.5–5.1)
Sodium: 139 mmol/L (ref 135–145)

## 2016-03-30 LAB — CBC
HCT: 38.9 % (ref 35.0–47.0)
Hemoglobin: 13.1 g/dL (ref 12.0–16.0)
MCH: 28.9 pg (ref 26.0–34.0)
MCHC: 33.6 g/dL (ref 32.0–36.0)
MCV: 86 fL (ref 80.0–100.0)
Platelets: 287 10*3/uL (ref 150–440)
RBC: 4.53 MIL/uL (ref 3.80–5.20)
RDW: 13.9 % (ref 11.5–14.5)
WBC: 18.2 10*3/uL — ABNORMAL HIGH (ref 3.6–11.0)

## 2016-03-30 LAB — MONONUCLEOSIS SCREEN: Mono Screen: NEGATIVE

## 2016-03-30 LAB — POCT RAPID STREP A: Streptococcus, Group A Screen (Direct): NEGATIVE

## 2016-03-30 MED ORDER — AMOXICILLIN-POT CLAVULANATE 875-125 MG PO TABS: 1.0000 | ORAL_TABLET | Freq: Two times a day (BID) | ORAL | 0 refills | Status: DC

## 2016-03-30 MED ORDER — ONDANSETRON HCL 4 MG/2ML IJ SOLN
4.0000 mg | Freq: Once | INTRAMUSCULAR | Status: AC
  Administered 2016-03-30: 4 mg via INTRAVENOUS
  Filled 2016-03-30: qty 2

## 2016-03-30 MED ORDER — HYDROMORPHONE HCL 1 MG/ML IJ SOLN
1.0000 mg | Freq: Once | INTRAMUSCULAR | Status: AC
Start: 2016-03-30 — End: 2016-03-30
  Administered 2016-03-30: 1 mg via INTRAVENOUS
  Filled 2016-03-30: qty 1

## 2016-03-30 MED ORDER — AMOXICILLIN 500 MG PO CAPS: 500.0000 mg | ORAL_CAPSULE | Freq: Three times a day (TID) | ORAL | 0 refills | Status: DC

## 2016-03-30 MED ORDER — ONDANSETRON HCL 4 MG/2ML IJ SOLN
INTRAMUSCULAR | Status: AC
  Administered 2016-03-30: 4 mg via INTRAVENOUS
  Filled 2016-03-30: qty 2

## 2016-03-30 MED ORDER — MORPHINE SULFATE (PF) 2 MG/ML IV SOLN
2.0000 mg | Freq: Once | INTRAVENOUS | Status: AC
  Administered 2016-03-30: 2 mg via INTRAVENOUS
  Filled 2016-03-30: qty 1

## 2016-03-30 MED ORDER — SODIUM CHLORIDE 0.9 % IV SOLN
3.0000 g | Freq: Once | INTRAVENOUS | Status: AC
  Administered 2016-03-30: 3 g via INTRAVENOUS
  Filled 2016-03-30: qty 3

## 2016-03-30 MED ORDER — LIDOCAINE HCL (PF) 1 % IJ SOLN
INTRAMUSCULAR | Status: AC
  Administered 2016-03-30: 5 mL
  Filled 2016-03-30: qty 5

## 2016-03-30 MED ORDER — FAMOTIDINE IN NACL 20-0.9 MG/50ML-% IV SOLN
20.0000 mg | Freq: Once | INTRAVENOUS | Status: AC
Start: 2016-03-30 — End: 2016-03-30
  Administered 2016-03-30: 20 mg via INTRAVENOUS
  Filled 2016-03-30: qty 50

## 2016-03-30 MED ORDER — CEFTRIAXONE SODIUM 1 G IJ SOLR
2.0000 g | Freq: Once | INTRAMUSCULAR | Status: AC
  Administered 2016-03-30: 2 g via INTRAMUSCULAR
  Filled 2016-03-30: qty 20

## 2016-03-30 MED ORDER — DIPHENHYDRAMINE HCL 50 MG/ML IJ SOLN
INTRAMUSCULAR | Status: AC
  Filled 2016-03-30: qty 1

## 2016-03-30 MED ORDER — PREDNISONE 10 MG PO TABS: ORAL_TABLET | ORAL | 0 refills | Status: DC

## 2016-03-30 MED ORDER — DEXAMETHASONE SODIUM PHOSPHATE 10 MG/ML IJ SOLN
10.0000 mg | Freq: Once | INTRAMUSCULAR | Status: AC
  Administered 2016-03-30: 10 mg via INTRAVENOUS
  Filled 2016-03-30: qty 1

## 2016-03-30 MED ORDER — HYDROMORPHONE HCL 1 MG/ML IJ SOLN
1.0000 mg | Freq: Once | INTRAMUSCULAR | Status: AC
  Administered 2016-03-30: 1 mg via INTRAVENOUS
  Filled 2016-03-30: qty 1

## 2016-03-30 MED ORDER — ONDANSETRON HCL 4 MG/2ML IJ SOLN
4.0000 mg | Freq: Once | INTRAMUSCULAR | Status: AC
  Administered 2016-03-30: 4 mg via INTRAVENOUS

## 2016-03-30 MED ORDER — SODIUM CHLORIDE 0.9 % IV BOLUS (SEPSIS)
1000.0000 mL | Freq: Once | INTRAVENOUS | Status: AC
  Administered 2016-03-30: 1000 mL via INTRAVENOUS

## 2016-03-30 MED ORDER — METHYLPREDNISOLONE ACETATE 80 MG/ML IJ SUSP
80.0000 mg | Freq: Once | INTRAMUSCULAR | Status: AC
  Administered 2016-03-30: 80 mg via INTRAMUSCULAR
  Filled 2016-03-30: qty 2

## 2016-03-30 MED ORDER — HYDROCODONE-ACETAMINOPHEN 5-325 MG PO TABS: 1.0000 | ORAL_TABLET | ORAL | 0 refills | Status: DC | PRN

## 2016-03-30 MED ORDER — DIPHENHYDRAMINE HCL 50 MG/ML IJ SOLN
50.0000 mg | Freq: Once | INTRAMUSCULAR | Status: AC
  Administered 2016-03-30: 50 mg via INTRAVENOUS

## 2016-03-30 MED ORDER — IOPAMIDOL (ISOVUE-300) INJECTION 61%
75.0000 mL | Freq: Once | INTRAVENOUS | Status: AC | PRN
  Administered 2016-03-30: 75 mL via INTRAVENOUS
  Filled 2016-03-30: qty 75

## 2016-03-30 NOTE — ED Notes (Signed)
Pts denies worsening in itching, has not spread above elbow.

## 2016-03-30 NOTE — ED Provider Notes (Signed)
[facility] Provider Note   CSN: [id number] Arrival date & time: 03/30/16  1056     History   Chief Complaint Chief Complaint  Patient presents with  . Sore Throat    HPI Shawna Anderson is a 50 y.o. female presents to the emergency department for evaluation of sore throat. Patient has severe sore throat pain, symptoms began 1 day ago, she was treated at urgent care facility, rapid strep test was negative and she was sent home with lidocaine to swish gargle and spit for pain. Patient states this morning, her pain became more severe. Mostly on the right side. She denies any fevers. She is able to tolerate fluids but with severe pain. She is not getting any relief with gargling lidocaine. She is not taking any other medications for pain. She denies any neck pain, headache, fevers. HPI  Past Medical History:  Diagnosis Date  . Asthma   . Bipolar 1 disorder ([place])   . Depression   . Diabetes mellitus without complication ([place])   . GERD (gastroesophageal reflux disease)   . Hypertension     Patient Active Problem List   Diagnosis Date Noted  . Abdominal pain 01/12/2015  . Vomiting 01/12/2015  . Nausea and vomiting 01/12/2015    Past Surgical History:  Procedure Laterality Date  . ABDOMINAL HYSTERECTOMY    . ABDOMINAL SURGERY  1995   Bowel resection.  Marland Kitchen CARDIAC SURGERY    . CORONARY ANGIOPLASTY WITH STENT PLACEMENT  2010   Drug eluting stent    OB History    No data available       Home Medications    Prior to Admission medications   Medication Sig Start Date End Date Taking? Authorizing Provider  albuterol (PROVENTIL HFA;VENTOLIN HFA) 108 (90 BASE) MCG/ACT inhaler Inhale 2 puffs into the lungs every 4 (four) hours as needed for wheezing or shortness of breath.    Historical Provider, MD  amoxicillin (AMOXIL) 500 MG capsule Take 1 capsule (500 mg total) by mouth 3 (three) times daily. 03/30/16   Duanne Guess, PA-C  amoxicillin-clavulanate  (AUGMENTIN) 875-125 MG tablet Take 1 tablet by mouth every 12 (twelve) hours. 03/30/16 04/09/16  Duanne Guess, PA-C  aspirin 81 MG tablet Take 81 mg by mouth daily.    Historical Provider, MD  clopidogrel (PLAVIX) 75 MG tablet Take 75 mg by mouth daily.    Historical Provider, MD  divalproex (DEPAKOTE ER) 500 MG 24 hr tablet Take 500-1,000 mg by mouth 4 (four) times daily. 1 tablet AM, NOON, PM and 2 at bedtime    Historical Provider, MD  famotidine (PEPCID) 40 MG tablet Take 40 mg by mouth daily.    Historical Provider, MD  Fluticasone Furoate-Vilanterol (BREO ELLIPTA IN) Inhale 1 puff into the lungs 2 (two) times daily.    Historical Provider, MD  HYDROcodone-acetaminophen (NORCO) 5-325 MG tablet Take 1 tablet by mouth every 4 (four) hours as needed for moderate pain. 03/30/16   Duanne Guess, PA-C  lisinopril (PRINIVIL,ZESTRIL) 10 MG tablet Take 10 mg by mouth daily.    Historical Provider, MD  metoprolol (LOPRESSOR) 100 MG tablet Take 100 mg by mouth 2 (two) times daily.    Historical Provider, MD  pantoprazole (PROTONIX) 40 MG tablet Take 40 mg by mouth daily.    Historical Provider, MD  predniSONE (DELTASONE) 10 MG tablet 10 day taper. 5,5,4,4,3,3,2,2,1,1 03/30/16   Duanne Guess, PA-C  promethazine (PHENERGAN) 25 MG suppository Place 1 suppository (25 mg  total) rectally every 6 (six) hours as needed for nausea. 03/05/15 03/04/16  Loney Hering, MD  sertraline (ZOLOFT) 50 MG tablet Take 50 mg by mouth daily.    Historical Provider, MD  traMADol (ULTRAM) 50 MG tablet Take 1 tablet (50 mg total) by mouth every 6 (six) hours as needed. 03/05/15   Loney Hering, MD  traZODone (DESYREL) 150 MG tablet Take 150 mg by mouth at bedtime.    Historical Provider, MD    Family History No family history on file.  Social History Social History  Substance Use Topics  . Smoking status: Current Every Day Smoker    Packs/day: 0.50    Types: Cigarettes  . Smokeless tobacco: Never Used  . Alcohol  use Yes     Allergies   Drixoral allergy sinus [dexbromphen-pse-apap er]   Review of Systems Review of Systems  Constitutional: Negative for chills and fever.  HENT: Positive for sore throat and trouble swallowing (pain with). Negative for ear pain and voice change.   Eyes: Negative for pain and visual disturbance.  Respiratory: Negative for cough and shortness of breath.   Cardiovascular: Negative for chest pain and palpitations.  Gastrointestinal: Negative for abdominal pain and vomiting.  Genitourinary: Negative for dysuria and hematuria.  Musculoskeletal: Negative for arthralgias and back pain.  Skin: Negative for color change and rash.  Neurological: Negative for seizures and syncope.  All other systems reviewed and are negative.    Physical Exam Updated Vital Signs BP (!) 152/72 (BP Location: Left Arm)   Pulse 88   Temp 98.3 F (36.8 C) (Oral)   Resp 18   Ht 5\' 3"  (1.6 m)   Wt 83.9 kg   SpO2 99%   BMI 32.77 kg/m   Physical Exam  Constitutional: She appears well-developed and well-nourished. No distress.  Patient tolerating by mouth well.  HENT:  Head: Normocephalic and atraumatic.  Right Ear: External ear normal.  Left Ear: External ear normal.  Mouth/Throat: Uvula is midline. There is trismus (mild) in the jaw. No uvula swelling. Oropharyngeal exudate, posterior oropharyngeal edema, posterior oropharyngeal erythema and tonsillar abscesses (right greater than left) present. Tonsils are 3+ on the right. Tonsils are 2+ on the left. Tonsillar exudate.  Eyes: Conjunctivae are normal.  Neck: Neck supple.  Cardiovascular: Normal rate and regular rhythm.   No murmur heard. Pulmonary/Chest: Effort normal and breath sounds normal. No respiratory distress.  Abdominal: Soft. There is no tenderness.  Musculoskeletal: She exhibits no edema.  Neurological: She is alert.  Skin: Skin is warm and dry.  Psychiatric: She has a normal mood and affect.  Nursing note and vitals  reviewed.    ED Treatments / Results  Labs (all labs ordered are listed, but only abnormal results are displayed) Labs Reviewed  CBC - Abnormal; Notable for the following:       Result Value   WBC 18.2 (*)    All other components within normal limits  BASIC METABOLIC PANEL - Abnormal; Notable for the following:    Glucose, Bld 116 (*)    Anion gap 4 (*)    All other components within normal limits  MONONUCLEOSIS SCREEN  POCT RAPID STREP A    EKG  EKG Interpretation None       Radiology Ct Soft Tissue Neck W Contrast  Result Date: 03/30/2016 CLINICAL DATA:  Throat pain, nausea vomiting since yesterday. EXAM: CT NECK WITH CONTRAST TECHNIQUE: Multidetector CT imaging of the neck was performed using the standard protocol  following the bolus administration of intravenous contrast. CONTRAST:  56mL ISOVUE-300 IOPAMIDOL (ISOVUE-300) INJECTION 61% COMPARISON:  None. FINDINGS: Pharynx and larynx: Extensive Hounsfield artifact from amalgam and dental prostheses casts significant beam hardening. BILATERAL palatine tonsillar enlargement is present, RIGHT greater than LEFT. Prominence of the lingual tonsillar tissue as well as nasopharyngeal adenoids are observed. The estimated size of the enlarged enhancing palatine tonsil is 23 x 15 mm cross-section, with central hypoattenuation, up to 1 cm diameter, suggesting phlegmon or early abscess. Enlarged and inflamed palatine tonsil on the LEFT, with heterogeneity suggesting phlegmon, estimated 21 x 20 mm. The parapharyngeal fat is effaced, but in those areas which it is visualized, for instance near the pterygoids, there is no evidence for peritonsillar abscess/inflammation. No epiglottic enlargement. Normal larynx and subglottic region. Salivary glands: No inflammation, mass, or stone. Thyroid: Normal. Lymph nodes: Reactive BILATERAL cervical adenopathy, the largest of which involves the LEFT level 2A nodal station, up to 14 mm short axis. Other level 3,  level 4, and level 5 nodes are less impressive. Vascular: No evidence for internal jugular thrombophlebitis. Limited intracranial: Negative. Visualized orbits: Negative. Mastoids and visualized paranasal sinuses: Clear. Skeleton: Negative.  Poor dentition.  Extensive dental work. Upper chest: Negative. Other: None. IMPRESSION: BILATERAL tonsillitis. An early tonsillar abscess on the RIGHT is not excluded. No evidence for peritonsillar abscess. BILATERAL reactive cervical adenopathy. A call has been placed to the ordering provider. Electronically Signed   By: Staci Righter M.D.   On: 03/30/2016 13:44    Procedures Procedures (including critical care time)  Medications Ordered in ED Medications  sodium chloride 0.9 % bolus 1,000 mL (0 mLs Intravenous Stopped 03/30/16 1621)  dexamethasone (DECADRON) injection 10 mg (10 mg Intravenous Given 03/30/16 1155)  morphine 2 MG/ML injection 2 mg (2 mg Intravenous Given 03/30/16 1155)  diphenhydrAMINE (BENADRYL) injection 50 mg (50 mg Intravenous Given 03/30/16 1202)  HYDROmorphone (DILAUDID) injection 1 mg (1 mg Intravenous Given 03/30/16 1225)  iopamidol (ISOVUE-300) 61 % injection 75 mL (75 mLs Intravenous Contrast Given 03/30/16 1303)  Ampicillin-Sulbactam (UNASYN) 3 g in sodium chloride 0.9 % 100 mL IVPB (3 g Intravenous New Bag/Given 03/30/16 1513)  ondansetron (ZOFRAN) injection 4 mg (4 mg Intravenous Given 03/30/16 1353)  famotidine (PEPCID) IVPB 20 mg premix (0 mg Intravenous Stopped 03/30/16 1500)  methylPREDNISolone acetate (DEPO-MEDROL) injection 80 mg (80 mg Intramuscular Given 03/30/16 1515)  cefTRIAXone (ROCEPHIN) injection 2 g (2 g Intramuscular Given 03/30/16 1620)  HYDROmorphone (DILAUDID) injection 1 mg (1 mg Intravenous Given 03/30/16 1555)  ondansetron (ZOFRAN) injection 4 mg (4 mg Intravenous Given 03/30/16 1555)  lidocaine (PF) (XYLOCAINE) 1 % injection (5 mLs  Given 03/30/16 1620)     Initial Impression / Assessment and Plan / ED Course  I  have reviewed the triage vital signs and the nursing notes.  Pertinent labs & imaging results that were available during my care of the patient were reviewed by me and considered in my medical decision making (see chart for details).  Clinical Course    50 year old female with peritonsillar cellulitis. Initially pain severe with difficulty swallowing. After antibiotics, steroids and fluids. Patient significantly better. Patient is tolerating by mouth fluids and feels well. She speaks with no muffling to her voice. Vital signs are normal. She is given prescriptions for steroids, antibiotics and pain medications. She is instructions on follow-up. Final Clinical Impressions(s) / ED Diagnoses   Final diagnoses:  Tonsillitis  Peritonsillar cellulitis    New Prescriptions New Prescriptions   AMOXICILLIN (  AMOXIL) 500 MG CAPSULE    Take 1 capsule (500 mg total) by mouth 3 (three) times daily.   AMOXICILLIN-CLAVULANATE (AUGMENTIN) 875-125 MG TABLET    Take 1 tablet by mouth every 12 (twelve) hours.   HYDROCODONE-ACETAMINOPHEN (NORCO) 5-325 MG TABLET    Take 1 tablet by mouth every 4 (four) hours as needed for moderate pain.   PREDNISONE (DELTASONE) 10 MG TABLET    10 day taper. 5,5,4,4,3,3,2,2,1,1     Duanne Guess, PA-C 03/30/16 1635    Harvest Dark, MD 03/31/16 [phone]

## 2016-03-30 NOTE — Discharge Instructions (Signed)
Please take medications as prescribed. Drink lots of fluids. Return to the ER for any difficulty swallowing, opening your mouth, inability to swallow pills, worsening pain, fevers. Follow-up with ENT physician next week.

## 2016-03-30 NOTE — ED Notes (Addendum)
Pt c/o itching at and above IV site.  Some redness noted to area, no swelling. PA Marcello Moores informed and Benedryl ordered.  Pts resp status unchanged, alert and oriented x 4. NAD.

## 2016-03-30 NOTE — ED Triage Notes (Signed)
Pt c/o tonsilitis and was given lidocaine yesterday. Sore throat started yesterday and states the tylenol and ibuprofen is not helping with her pain.

## 2016-04-12 ENCOUNTER — Encounter (HOSPITAL_COMMUNITY): Payer: Self-pay | Admitting: Neurology

## 2016-04-12 ENCOUNTER — Inpatient Hospital Stay (HOSPITAL_COMMUNITY)
Admission: EM | Admit: 2016-04-12 | Discharge: 2016-04-15 | DRG: 287 | Disposition: A | Discharge status: Home / Self Care | Payer: Commercial Managed Care - HMO | Attending: Cardiovascular Disease | Admitting: Cardiovascular Disease

## 2016-04-12 ENCOUNTER — Emergency Department (HOSPITAL_COMMUNITY): Payer: Commercial Managed Care - HMO

## 2016-04-12 DIAGNOSIS — I2510 Atherosclerotic heart disease of native coronary artery without angina pectoris: Secondary | ICD-10-CM | POA: Diagnosis not present

## 2016-04-12 DIAGNOSIS — R0789 Other chest pain: Secondary | ICD-10-CM | POA: Diagnosis not present

## 2016-04-12 DIAGNOSIS — E119 Type 2 diabetes mellitus without complications: Secondary | ICD-10-CM | POA: Diagnosis present

## 2016-04-12 DIAGNOSIS — I200 Unstable angina: Secondary | ICD-10-CM | POA: Diagnosis not present

## 2016-04-12 DIAGNOSIS — E1169 Type 2 diabetes mellitus with other specified complication: Secondary | ICD-10-CM | POA: Diagnosis present

## 2016-04-12 DIAGNOSIS — Z7902 Long term (current) use of antithrombotics/antiplatelets: Secondary | ICD-10-CM | POA: Diagnosis not present

## 2016-04-12 DIAGNOSIS — I25110 Atherosclerotic heart disease of native coronary artery with unstable angina pectoris: Principal | ICD-10-CM | POA: Diagnosis present

## 2016-04-12 DIAGNOSIS — R402252 Coma scale, best verbal response, oriented, at arrival to emergency department: Secondary | ICD-10-CM | POA: Diagnosis present

## 2016-04-12 DIAGNOSIS — R402362 Coma scale, best motor response, obeys commands, at arrival to emergency department: Secondary | ICD-10-CM | POA: Diagnosis present

## 2016-04-12 DIAGNOSIS — R402142 Coma scale, eyes open, spontaneous, at arrival to emergency department: Secondary | ICD-10-CM | POA: Diagnosis present

## 2016-04-12 DIAGNOSIS — K219 Gastro-esophageal reflux disease without esophagitis: Secondary | ICD-10-CM | POA: Diagnosis present

## 2016-04-12 DIAGNOSIS — F17210 Nicotine dependence, cigarettes, uncomplicated: Secondary | ICD-10-CM | POA: Diagnosis present

## 2016-04-12 DIAGNOSIS — M5020 Other cervical disc displacement, unspecified cervical region: Secondary | ICD-10-CM

## 2016-04-12 DIAGNOSIS — Z955 Presence of coronary angioplasty implant and graft: Secondary | ICD-10-CM

## 2016-04-12 DIAGNOSIS — I10 Essential (primary) hypertension: Secondary | ICD-10-CM | POA: Diagnosis present

## 2016-04-12 DIAGNOSIS — Z9861 Coronary angioplasty status: Secondary | ICD-10-CM

## 2016-04-12 DIAGNOSIS — Z9114 Patient's other noncompliance with medication regimen: Secondary | ICD-10-CM

## 2016-04-12 DIAGNOSIS — Z7982 Long term (current) use of aspirin: Secondary | ICD-10-CM | POA: Diagnosis not present

## 2016-04-12 DIAGNOSIS — Z9119 Patient's noncompliance with other medical treatment and regimen: Secondary | ICD-10-CM | POA: Diagnosis not present

## 2016-04-12 DIAGNOSIS — Z8249 Family history of ischemic heart disease and other diseases of the circulatory system: Secondary | ICD-10-CM

## 2016-04-12 DIAGNOSIS — F319 Bipolar disorder, unspecified: Secondary | ICD-10-CM | POA: Diagnosis present

## 2016-04-12 DIAGNOSIS — J45909 Unspecified asthma, uncomplicated: Secondary | ICD-10-CM | POA: Diagnosis present

## 2016-04-12 DIAGNOSIS — R079 Chest pain, unspecified: Secondary | ICD-10-CM | POA: Diagnosis not present

## 2016-04-12 DIAGNOSIS — E1159 Type 2 diabetes mellitus with other circulatory complications: Secondary | ICD-10-CM | POA: Diagnosis not present

## 2016-04-12 DIAGNOSIS — I25700 Atherosclerosis of coronary artery bypass graft(s), unspecified, with unstable angina pectoris: Secondary | ICD-10-CM | POA: Diagnosis not present

## 2016-04-12 DIAGNOSIS — E785 Hyperlipidemia, unspecified: Secondary | ICD-10-CM | POA: Diagnosis present

## 2016-04-12 HISTORY — DX: Sleep apnea, unspecified: G47.30

## 2016-04-12 HISTORY — DX: Other cervical disc displacement, unspecified cervical region: M50.20

## 2016-04-12 LAB — I-STAT TROPONIN, ED: Troponin i, poc: 0.01 ng/mL (ref 0.00–0.08)

## 2016-04-12 LAB — TROPONIN I
Troponin I: <0.03 ng/mL (ref <0.03)
Troponin I: <0.03 ng/mL (ref <0.03)

## 2016-04-12 LAB — BASIC METABOLIC PANEL
Anion gap: 8 (ref 5–15)
BUN: 18 mg/dL (ref 6–20)
CO2: 23 mmol/L (ref 22–32)
Calcium: 8.8 mg/dL — ABNORMAL LOW (ref 8.9–10.3)
Chloride: 106 mmol/L (ref 101–111)
Creatinine, Ser: 0.73 mg/dL (ref 0.44–1.00)
GFR calc Af Amer: >60 mL/min (ref >60)
GFR calc non Af Amer: >60 mL/min (ref >60)
Glucose, Bld: 118 mg/dL — ABNORMAL HIGH (ref 65–99)
Potassium: 4.1 mmol/L (ref 3.5–5.1)
Sodium: 137 mmol/L (ref 135–145)

## 2016-04-12 LAB — CBC
HCT: 38.7 % (ref 36.0–46.0)
Hemoglobin: 12.3 g/dL (ref 12.0–15.0)
MCH: 28.5 pg (ref 26.0–34.0)
MCHC: 31.8 g/dL (ref 30.0–36.0)
MCV: 89.8 fL (ref 78.0–100.0)
Platelets: 386 10*3/uL (ref 150–400)
RBC: 4.31 MIL/uL (ref 3.87–5.11)
RDW: 14.3 % (ref 11.5–15.5)
WBC: 13.9 10*3/uL — ABNORMAL HIGH (ref 4.0–10.5)

## 2016-04-12 LAB — HEPARIN LEVEL (UNFRACTIONATED): Heparin Unfractionated: <0.1 IU/mL — ABNORMAL LOW (ref 0.30–0.70)

## 2016-04-12 LAB — MRSA PCR SCREENING: MRSA by PCR: NEGATIVE

## 2016-04-12 MED ORDER — PANTOPRAZOLE SODIUM 40 MG PO TBEC
40.0000 mg | DELAYED_RELEASE_TABLET | Freq: Every day | ORAL | Status: DC
  Administered 2016-04-12 – 2016-04-15 (×4): 40 mg via ORAL
  Filled 2016-04-12 (×4): qty 1

## 2016-04-12 MED ORDER — REGADENOSON 0.4 MG/5ML IV SOLN
0.4000 mg | Freq: Once | INTRAVENOUS | Status: DC
  Filled 2016-04-12: qty 5

## 2016-04-12 MED ORDER — METOPROLOL TARTRATE 25 MG PO TABS
50.0000 mg | ORAL_TABLET | Freq: Two times a day (BID) | ORAL | Status: DC
  Administered 2016-04-12 – 2016-04-15 (×7): 50 mg via ORAL
  Filled 2016-04-12 (×3): qty 1
  Filled 2016-04-12: qty 2
  Filled 2016-04-12: qty 1
  Filled 2016-04-12: qty 2
  Filled 2016-04-12: qty 1

## 2016-04-12 MED ORDER — ASPIRIN 81 MG PO TABS: 81.0000 mg | ORAL_TABLET | Freq: Every day | ORAL | Status: DC

## 2016-04-12 MED ORDER — ALBUTEROL SULFATE HFA 108 (90 BASE) MCG/ACT IN AERS: 2.0000 | INHALATION_SPRAY | RESPIRATORY_TRACT | Status: DC | PRN

## 2016-04-12 MED ORDER — HEPARIN (PORCINE) IN NACL 100-0.45 UNIT/ML-% IJ SOLN
1150.0000 [IU]/h | INTRAMUSCULAR | Status: DC
  Administered 2016-04-12: 700 [IU]/h via INTRAVENOUS
  Administered 2016-04-13: 1150 [IU]/h via INTRAVENOUS
  Filled 2016-04-12 (×2): qty 250

## 2016-04-12 MED ORDER — HEPARIN BOLUS VIA INFUSION
2100.0000 [IU] | Freq: Once | INTRAVENOUS | Status: AC
  Administered 2016-04-12: 2100 [IU] via INTRAVENOUS
  Filled 2016-04-12: qty 2100

## 2016-04-12 MED ORDER — ASPIRIN EC 81 MG PO TBEC
81.0000 mg | DELAYED_RELEASE_TABLET | Freq: Every day | ORAL | Status: DC
  Administered 2016-04-12 – 2016-04-15 (×4): 81 mg via ORAL
  Filled 2016-04-12 (×4): qty 1

## 2016-04-12 MED ORDER — DIVALPROEX SODIUM ER 500 MG PO TB24: 500.0000 mg | ORAL_TABLET | Freq: Four times a day (QID) | ORAL | Status: DC

## 2016-04-12 MED ORDER — ALBUTEROL SULFATE (2.5 MG/3ML) 0.083% IN NEBU: 2.5000 mg | INHALATION_SOLUTION | RESPIRATORY_TRACT | Status: DC | PRN

## 2016-04-12 MED ORDER — FLUTICASONE FUROATE-VILANTEROL 100-25 MCG/INH IN AEPB
1.0000 | INHALATION_SPRAY | Freq: Two times a day (BID) | RESPIRATORY_TRACT | Status: DC
  Filled 2016-04-12: qty 28

## 2016-04-12 MED ORDER — ACETAMINOPHEN 325 MG PO TABS: 650.0000 mg | ORAL_TABLET | ORAL | Status: DC | PRN

## 2016-04-12 MED ORDER — ONDANSETRON HCL 4 MG/2ML IJ SOLN: 4.0000 mg | Freq: Four times a day (QID) | INTRAMUSCULAR | Status: DC | PRN

## 2016-04-12 MED ORDER — SERTRALINE HCL 50 MG PO TABS
50.0000 mg | ORAL_TABLET | Freq: Every day | ORAL | Status: DC
  Administered 2016-04-12 – 2016-04-15 (×4): 50 mg via ORAL
  Filled 2016-04-12 (×4): qty 1

## 2016-04-12 MED ORDER — NITROGLYCERIN IN D5W 200-5 MCG/ML-% IV SOLN
0.0000 ug/min | INTRAVENOUS | Status: DC
  Administered 2016-04-12: 5 ug/min via INTRAVENOUS
  Filled 2016-04-12: qty 250

## 2016-04-12 MED ORDER — HEPARIN BOLUS VIA INFUSION
3500.0000 [IU] | Freq: Once | INTRAVENOUS | Status: AC
  Administered 2016-04-12: 3500 [IU] via INTRAVENOUS
  Filled 2016-04-12: qty 3500

## 2016-04-12 MED ORDER — TRAZODONE HCL 50 MG PO TABS
150.0000 mg | ORAL_TABLET | Freq: Every day | ORAL | Status: DC
  Administered 2016-04-12 – 2016-04-14 (×3): 150 mg via ORAL
  Filled 2016-04-12 (×3): qty 3

## 2016-04-12 MED ORDER — FLUTICASONE FUROATE-VILANTEROL 100-25 MCG/INH IN AEPB
1.0000 | INHALATION_SPRAY | Freq: Every day | RESPIRATORY_TRACT | Status: DC
  Administered 2016-04-12 – 2016-04-14 (×3): 1 via RESPIRATORY_TRACT

## 2016-04-12 MED ORDER — FAMOTIDINE 20 MG PO TABS
40.0000 mg | ORAL_TABLET | Freq: Every day | ORAL | Status: DC
  Administered 2016-04-12 – 2016-04-15 (×4): 40 mg via ORAL
  Filled 2016-04-12 (×4): qty 2

## 2016-04-12 MED ORDER — LISINOPRIL 10 MG PO TABS
10.0000 mg | ORAL_TABLET | Freq: Every day | ORAL | Status: DC
  Administered 2016-04-12 – 2016-04-15 (×4): 10 mg via ORAL
  Filled 2016-04-12 (×4): qty 1

## 2016-04-12 MED ORDER — CLOPIDOGREL BISULFATE 75 MG PO TABS
75.0000 mg | ORAL_TABLET | Freq: Every day | ORAL | Status: DC
  Administered 2016-04-12 – 2016-04-15 (×4): 75 mg via ORAL
  Filled 2016-04-12 (×5): qty 1

## 2016-04-12 NOTE — ED Provider Notes (Signed)
Medical screening examination/treatment/procedure(s) were conducted as a shared visit with non-physician practitioner(s) and myself.  I personally evaluated the patient during the encounter.   EKG Interpretation  Date/Time:  Saturday April 12 2016 07:11:21 EDT Ventricular Rate:  73 PR Interval:    QRS Duration: 78 QT Interval:  374 QTC Calculation: 413 R Axis:   -11 Text Interpretation:  Sinus rhythm Nonspecific T abnormalities, lateral leads new t wave changes compared to prior tracing Confirmed by Matteson Blue  MD-J, Alaia Lordi ([id number]) on 04/12/2016 7:55:52 AM      Pt has a history of CAD.  Cardiac stent in 2010.  Pt has been noncompliant with her medications.  She continues to smoke.  Pain earlier was sharp but then felt a pressure on her chest.  Much better than earlier but still having pain.  Will give nitro.  Start heparin.  Consult with cardiology considering her history and EKG changes   Dorie Rank, MD 04/12/16 [phone]

## 2016-04-12 NOTE — Progress Notes (Signed)
ANTICOAGULATION CONSULT NOTE - Follow Up Consult  Pharmacy Consult for heparin Indication: chest pain/ACS  Allergies  Allergen Reactions  . Drixoral Allergy Sinus [Dexbromphen-Pse-Apap Er] Rash    Patient Measurements: Height: 5\' 3"  (160 cm) Weight: 186 lb 8.2 oz (84.6 kg) IBW/kg (Calculated) : 52.4 Heparin Dosing Weight: 71.2 kg   Vital Signs: Temp: 98.1 F (36.7 C) (09/30 1609) Temp Source: Oral (09/30 1609) BP: 132/87 (09/30 1609) Pulse Rate: 76 (09/30 1609)  Labs:  Recent Labs  04/12/16 0721 04/12/16 1132 04/12/16 1532  HGB 12.3  --   --   HCT 38.7  --   --   PLT 386  --   --   HEPARINUNFRC  --   --  <0.10*  CREATININE 0.73  --   --   TROPONINI  --  <0.03  --     Estimated Creatinine Clearance: 88.7 mL/min (by C-G formula based on SCr of 0.73 mg/dL).   Medications:  Prescriptions Prior to Admission  Medication Sig Dispense Refill Last Dose  . albuterol (PROVENTIL HFA;VENTOLIN HFA) 108 (90 BASE) MCG/ACT inhaler Inhale 2 puffs into the lungs every 4 (four) hours as needed for wheezing or shortness of breath.   unk  . HYDROcodone-acetaminophen (NORCO) 5-325 MG tablet Take 1 tablet by mouth every 4 (four) hours as needed for moderate pain. 20 tablet 0 Past Month at Unknown time  . promethazine (PHENERGAN) 25 MG suppository Place 1 suppository (25 mg total) rectally every 6 (six) hours as needed for nausea. 12 suppository 0 unk  . traMADol (ULTRAM) 50 MG tablet Take 1 tablet (50 mg total) by mouth every 6 (six) hours as needed. (Patient taking differently: Take 50 mg by mouth every 6 (six) hours as needed for moderate pain. ) 12 tablet 0 unk  . traZODone (DESYREL) 150 MG tablet Take 150 mg by mouth at bedtime.   Past Week at Unknown time  . aspirin 81 MG tablet Take 81 mg by mouth daily.   Past Week at Unknown time  . clopidogrel (PLAVIX) 75 MG tablet Take 75 mg by mouth daily.   Past Week at Unknown time  . divalproex (DEPAKOTE ER) 500 MG 24 hr tablet Take 500-1,000  mg by mouth 4 (four) times daily. 1 tablet AM, NOON, PM and 2 at bedtime   Past Week at Unknown time  . famotidine (PEPCID) 40 MG tablet Take 40 mg by mouth daily.   Past Week at Unknown time  . Fluticasone Furoate-Vilanterol (BREO ELLIPTA IN) Inhale 1 puff into the lungs 2 (two) times daily.   Past Week at Unknown time  . lisinopril (PRINIVIL,ZESTRIL) 10 MG tablet Take 10 mg by mouth daily.   Past Week at Unknown time  . metoprolol (LOPRESSOR) 100 MG tablet Take 100 mg by mouth 2 (two) times daily.   Past Week at Unknown time  . pantoprazole (PROTONIX) 40 MG tablet Take 40 mg by mouth daily.   Past Week at Unknown time  . sertraline (ZOLOFT) 50 MG tablet Take 50 mg by mouth daily.   Past Week at Unknown time    Assessment: 50 yo F admitted 04/12/2016 with chest pain and hypertension. She has a history of CAD (s/p LCx with stenting in 2010). Her chest pain has been relieved by IV nitro and heparin. Troponin so far are negative. Pharmacy has been consulted to dose heparin.  Heparin level <0.10 (subtherapeutic), Hgb stable. No issues with line or s/sx of bleeding noted by RN.  Goal of  Therapy:  Heparin level 0.3-0.7 units/ml Monitor platelets by anticoagulation protocol: Yes   Plan:  Give 2100 unit bolus x1 Increase heparin to 950 units/hr Monitor 6hr heparin level Monitor daily heparin level, CBC and s/sx of bleeding   Dimitri Ped, PharmD. PGY-2 Infectious Diseases Pharmacy Resident Pager: [phone] 04/12/2016,4:36 PM

## 2016-04-12 NOTE — H&P (Signed)
ADMISSION HISTORY AND PHYSICAL   Date: 04/12/2016               Patient Name:  Shawna Anderson MRN: [id number]  DOB: 1966-06-24 Age / Sex: 50 y.o., female        PCP: Lavera Guise Primary Cardiologist: New / Rhyland Hinderliter         History of Present Illness: Patient is a 50 y.o. female with a PMHx of CAD ( s/p LCx stenting 2010) , HTN ,  hyperlipidemia Manic depression,  OSA, GERD   who was admitted to [facility] on 04/12/2016 for evaluation of  Chest pain and HTN,Has mental health here   Pt has not taken her meds in several months .   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  The pain was described as a heaviness or fullness in the middle of her chest.  It was described as a sharp pain.  Started at 5 AM this morning.. It was very similar to her previous episodes of angina 2010. The pain was relieved  with sublingual nitroglycerin but then returned. . There was no radiation. No diaphoresis.   Feeling better  - was started on IV NTG and heparin BP has improved.   Medications: Outpatient medications:  (Not in a hospital admission)  Allergies  Allergen Reactions  . Drixoral Allergy Sinus [Dexbromphen-Pse-Apap Er] Rash     Past Medical History:  Diagnosis Date  . Asthma   . Bipolar 1 disorder ([place])   . Depression   . Diabetes mellitus without complication ([place])   . GERD (gastroesophageal reflux disease)   . Hypertension     Past Surgical History:  Procedure Laterality Date  . ABDOMINAL HYSTERECTOMY    . ABDOMINAL SURGERY  1995   Bowel resection.  Marland Kitchen CARDIAC SURGERY    . CORONARY ANGIOPLASTY WITH STENT PLACEMENT  2010   Drug eluting stent    Family History  Problem Relation Age of Onset  . CAD Mother   . CAD Brother     Social History:  reports that she has been smoking Cigarettes.  She has been smoking  about 0.50 packs per day. She has never used smokeless tobacco. She reports that she drinks alcohol. She reports that she uses drugs, including Marijuana, about 7 times per week.   Review of Systems: Constitutional:  denies fever, chills, diaphoresis, appetite change and fatigue.  HEENT: denies photophobia, eye pain, redness, hearing loss, ear pain, congestion, sore throat, rhinorrhea, sneezing, neck pain, neck stiffness and tinnitus.  Respiratory: denies SOB, DOE, cough, chest tightness, and wheezing.  Cardiovascular: admits to chest pain,    Gastrointestinal: denies nausea, vomiting, abdominal pain, diarrhea, constipation, blood in stool.  Genitourinary: denies dysuria, urgency, frequency, hematuria, flank pain and difficulty urinating.  Musculoskeletal: denies  myalgias, back pain, joint swelling, arthralgias and gait problem.   Skin: denies pallor, rash and wound.  Neurological: denies dizziness, seizures, syncope, weakness, light-headedness, numbness and headaches.   Hematological: denies adenopathy, easy bruising, personal or family bleeding history.  Psychiatric/ Behavioral: denies suicidal ideation, mood changes, confusion, nervousness, sleep disturbance and agitation.    Physical Exam: BP 138/80   Pulse 74   Temp 98.3 F (36.8 C) (Oral)   Resp 20   SpO2 100%   Wt Readings from Last 3 Encounters:  03/30/16  185 lb (83.9 kg)  12/22/15 185 lb (83.9 kg)  03/05/15 188 lb (85.3 kg)    General: Vital signs reviewed and noted. Well-developed, well-nourished, in no acute distress; alert,   Head: Normocephalic, atraumatic, sclera anicteric, mucus membranes are moist   Neck: Supple. Negative for carotid bruits. JVD not elevated.   Lungs:  Clear bilaterally to auscultation without wheezes, rales, or rhonchi. Breathing is normal   Heart: RRR with S1 S2. No murmurs, rubs, or gallops.   Abdomen:  Soft, non-tender, non-distended with normoactive bowel sounds. No hepatomegaly. No  rebound/guarding. No obvious abdominal masses   MSK: Strength and the appear normal for age.   Extremities: No clubbing or cyanosis. No edema.  Distal pedal pulses are 2+ and equal bilaterally .  Neurologic: Alert and oriented X 3. Moves all extremities spontaneously   Psych:  normal     Lab results: Basic Metabolic Panel:  Recent Labs Lab 04/12/16 0721  NA 137  K 4.1  CL 106  CO2 23  GLUCOSE 118*  BUN 18  CREATININE 0.73  CALCIUM 8.8*    Liver Function Tests: No results for input(s): AST, ALT, ALKPHOS, BILITOT, PROT, ALBUMIN in the last 168 hours. No results for input(s): LIPASE, AMYLASE in the last 168 hours.  CBC:  Recent Labs Lab 04/12/16 0721  WBC 13.9*  HGB 12.3  HCT 38.7  MCV 89.8  PLT 386    Cardiac Enzymes: No results for input(s): CKTOTAL, CKMB, CKMBINDEX, TROPONINI in the last 168 hours.  BNP: Invalid input(s): POCBNP  CBG: No results for input(s): GLUCAP in the last 168 hours.  Coagulation Studies: No results for input(s): LABPROT, INR in the last 72 hours.   Other results:  EKG   -NSR , TWI in the lateral leads  - the TWI are new from previous ECG in 2016.   Imaging: Dg Chest 2 View  Result Date: 04/12/2016 CLINICAL DATA:  Chest pain that began this morning, followed by back spasms. Hx cardiac stent in 02/2009, HTN, asthma, smoker EXAM: CHEST  2 VIEW COMPARISON:  12/22/2015 FINDINGS: The heart size and mediastinal contours are within normal limits. Both lungs are clear. No pleural effusion or pneumothorax. The visualized skeletal structures are unremarkable. IMPRESSION: No active cardiopulmonary disease. Electronically Signed   By: Lajean Manes M.D.   On: 04/12/2016 07:45      Assessment & Plan:  1. Chest pain: Patient has a history of torn artery disease and is status post stenting of her left circumflex looks artery in 2010. She now presents with pain that is very similar to her previous episode of angina. It was relieved this morning  by sublingual nitroglycerin and is now completely resolved on IV nitroglycerin and IV heparin. Her blood pressure is also better.  We will get serial cardiac enzymes. If her enzymes are negative we will proceed with a stress Myoview study tomorrow. If the enzymes/troponin levels are elevated then we'll proceed with cardiac catheterization-probably on Monday.  2. Hypertension: The patient was previously on metoprolol and lisinopril. She has not been taking his medications regularly because she cannot afford them. She'll need to be hooked up with the [facility] in [place]  3. Depression: She'll need to follow-up with her behavioral health doctors.  DVT PPX - iv heparin    Thayer Headings, Brooke Bonito., MD, [facility] 04/12/2016, 9:57 AM

## 2016-04-12 NOTE — ED Notes (Signed)
Placed patient into a gown and on the monitor ekg shown to dr Jeneen Rinks

## 2016-04-12 NOTE — ED Provider Notes (Signed)
[facility] DEPT Provider Note   CSN: [id number] Arrival date & time: 04/12/16  [id number]     History   Chief Complaint Chief Complaint  Patient presents with  . Chest Pain    HPI Shawna Anderson is a 50 y.o. female.  Shawna Anderson is a 51 y.o. female with h/o  Asthma, bipolar 1 disorder, depression, DM, GERD, HTN, tobacco user presents to ED with complaint of chest pain. Patient woke this morning to have her morning cigarette when she had sudden onset of centralized stabbing chest pain, 10/10. She took one SL NTG that eased pain off to 7/10 and described as "somone sitting on chest." She went to get coffee, upon her return chest pain returned to 10/10. She subsequently called EMS for transport to ED. EMS gave 2 additional SL NTG and 324mg  ASA with improvement in pain. Pain is currently 3/10. She does endorse "muscle spasms" in back. Denies fever, diaphoresis, shortness of breath, cough, N/V, abdominal pain, dizziness, lightheadedness, or LOC. No recent long distance travel/surgery/hospitalization, h/o blood clot, h/o cancer, hemoptysis, or OCP use. Patient h/o of coronary angioplasty with stent placement in 2010; patient moved from [place] approximately one year ago and has not been taking her medications for approximately one year due to financial constraints. Robust family h/o heart disease.       Past Medical History:  Diagnosis Date  . Asthma   . Bipolar 1 disorder ([place])   . Depression   . Diabetes mellitus without complication ([place])   . GERD (gastroesophageal reflux disease)   . Hypertension     Patient Active Problem List   Diagnosis Date Noted  . Abdominal pain 01/12/2015  . Vomiting 01/12/2015  . Nausea and vomiting 01/12/2015    Past Surgical History:  Procedure Laterality Date  . ABDOMINAL HYSTERECTOMY    . ABDOMINAL SURGERY  1995   Bowel resection.  Marland Kitchen CARDIAC SURGERY    . CORONARY ANGIOPLASTY WITH STENT PLACEMENT  2010   Drug eluting stent    OB History     No data available       Home Medications    Prior to Admission medications   Medication Sig Start Date End Date Taking? Authorizing Provider  albuterol (PROVENTIL HFA;VENTOLIN HFA) 108 (90 BASE) MCG/ACT inhaler Inhale 2 puffs into the lungs every 4 (four) hours as needed for wheezing or shortness of breath.   Yes Historical Provider, MD  HYDROcodone-acetaminophen (NORCO) 5-325 MG tablet Take 1 tablet by mouth every 4 (four) hours as needed for moderate pain. 03/30/16  Yes Duanne Guess, PA-C  promethazine (PHENERGAN) 25 MG suppository Place 1 suppository (25 mg total) rectally every 6 (six) hours as needed for nausea. 03/05/15 04/12/16 Yes Loney Hering, MD  traMADol (ULTRAM) 50 MG tablet Take 1 tablet (50 mg total) by mouth every 6 (six) hours as needed. Patient taking differently: Take 50 mg by mouth every 6 (six) hours as needed for moderate pain.  03/05/15  Yes Loney Hering, MD  traZODone (DESYREL) 150 MG tablet Take 150 mg by mouth at bedtime.   Yes Historical Provider, MD  aspirin 81 MG tablet Take 81 mg by mouth daily.    Historical Provider, MD  clopidogrel (PLAVIX) 75 MG tablet Take 75 mg by mouth daily.    Historical Provider, MD  divalproex (DEPAKOTE ER) 500 MG 24 hr tablet Take 500-1,000 mg by mouth 4 (four) times daily. 1 tablet AM, NOON, PM and 2 at bedtime  Historical Provider, MD  famotidine (PEPCID) 40 MG tablet Take 40 mg by mouth daily.    Historical Provider, MD  Fluticasone Furoate-Vilanterol (BREO ELLIPTA IN) Inhale 1 puff into the lungs 2 (two) times daily.    Historical Provider, MD  lisinopril (PRINIVIL,ZESTRIL) 10 MG tablet Take 10 mg by mouth daily.    Historical Provider, MD  metoprolol (LOPRESSOR) 100 MG tablet Take 100 mg by mouth 2 (two) times daily.    Historical Provider, MD  pantoprazole (PROTONIX) 40 MG tablet Take 40 mg by mouth daily.    Historical Provider, MD  sertraline (ZOLOFT) 50 MG tablet Take 50 mg by mouth daily.    Historical Provider,  MD    Family History Family History  Problem Relation Age of Onset  . CAD Mother   . CAD Brother     Social History Social History  Substance Use Topics  . Smoking status: Current Every Day Smoker    Packs/day: 0.50    Types: Cigarettes  . Smokeless tobacco: Never Used  . Alcohol use Yes     Allergies   Drixoral allergy sinus [dexbromphen-pse-apap er]   Review of Systems Review of Systems  Constitutional: Negative for fever.  HENT: Negative for trouble swallowing.   Eyes: Negative for visual disturbance.  Respiratory: Negative for cough and shortness of breath.   Cardiovascular: Positive for chest pain.  Gastrointestinal: Negative for abdominal pain, nausea and vomiting.  Genitourinary: Negative for dysuria and hematuria.  Musculoskeletal: Positive for back pain.  Skin: Negative for rash.  Neurological: Negative for dizziness, syncope, weakness, light-headedness and numbness.     Physical Exam Updated Vital Signs BP 138/80   Pulse 74   Temp 98.3 F (36.8 C) (Oral)   Resp 20   SpO2 100%   Physical Exam  Constitutional: She appears well-developed and well-nourished. No distress.  HENT:  Head: Normocephalic and atraumatic.  Mouth/Throat: Oropharynx is clear and moist. No oropharyngeal exudate.  Eyes: Conjunctivae and EOM are normal. Pupils are equal, round, and reactive to light. Right eye exhibits no discharge. Left eye exhibits no discharge. No scleral icterus.  Neck: Normal range of motion and phonation normal. Neck supple. No neck rigidity. Normal range of motion present.  Cardiovascular: Normal rate, regular rhythm, normal heart sounds and intact distal pulses.   No murmur heard. Strong 2+, brisk, equal pulses in all four extremities  Pulmonary/Chest: Effort normal and breath sounds normal. No stridor. No respiratory distress. She has no wheezes. She has no rales.  Abdominal: Soft. Bowel sounds are normal. She exhibits no distension. There is no  tenderness. There is no rigidity, no rebound, no guarding and no CVA tenderness.  Musculoskeletal: Normal range of motion.  Lymphadenopathy:    She has no cervical adenopathy.  Neurological: She is alert. She is not disoriented. Coordination and gait normal. GCS eye subscore is 4. GCS verbal subscore is 5. GCS motor subscore is 6.  Skin: Skin is warm and dry. She is not diaphoretic.  Psychiatric: She has a normal mood and affect. Her behavior is normal.     ED Treatments / Results  Labs (all labs ordered are listed, but only abnormal results are displayed) Labs Reviewed  BASIC METABOLIC PANEL - Abnormal; Notable for the following:       Result Value   Glucose, Bld 118 (*)    Calcium 8.8 (*)    All other components within normal limits  CBC - Abnormal; Notable for the following:    WBC 13.9 (*)  All other components within normal limits  HEPARIN LEVEL (UNFRACTIONATED)  I-STAT TROPOININ, ED     EKG  EKG Interpretation  Date/Time:  Saturday April 12 2016 07:11:21 EDT Ventricular Rate:  73 PR Interval:    QRS Duration: 78 QT Interval:  374 QTC Calculation: 413 R Axis:   -11 Text Interpretation:  Sinus rhythm Nonspecific T abnormalities, lateral leads new t wave changes compared to prior tracing Confirmed by KNAPP  MD-J, JON [id number]) on 04/12/2016 7:55:52 AM Also confirmed by KNAPP  MD-J, JON [phone]), editor [place] CT, Leda Gauze [phone])  on 04/12/2016 9:25:31 AM       Radiology Dg Chest 2 View  Result Date: 04/12/2016 CLINICAL DATA:  Chest pain that began this morning, followed by back spasms. Hx cardiac stent in 02/2009, HTN, asthma, smoker EXAM: CHEST  2 VIEW COMPARISON:  12/22/2015 FINDINGS: The heart size and mediastinal contours are within normal limits. Both lungs are clear. No pleural effusion or pneumothorax. The visualized skeletal structures are unremarkable. IMPRESSION: No active cardiopulmonary disease. Electronically Signed   By: Lajean Manes M.D.   On: 04/12/2016  07:45    Procedures Procedures (including critical care time)  Medications Ordered in ED Medications  nitroGLYCERIN 50 mg in dextrose 5 % 250 mL (0.2 mg/mL) infusion (5 mcg/min Intravenous New Bag/Given 04/12/16 0844)  heparin bolus via infusion 3,500 Units (not administered)  heparin ADULT infusion 100 units/mL (25000 units/212mL sodium chloride 0.45%) (700 Units/hr Intravenous New Bag/Given 04/12/16 0916)    Vitals:   04/12/16 0711 04/12/16 0815 04/12/16 0945  BP: 145/94 153/92 138/80  Pulse: 73 66 74  Resp: 16 14 20   Temp: 98.3 F (36.8 C)    TempSrc: Oral    SpO2: 100% 100% 100%    Initial Impression / Assessment and Plan / ED Course  I have reviewed the triage vital signs and the nursing notes.  Pertinent labs & imaging results that were available during my care of the patient were reviewed by me and considered in my medical decision making (see chart for details).  Clinical Course  Value Comment By Time  DG Chest 2 View Normal cardiac silhouette. No evidence of consolidation, effusion, or PTX. No free air under diaphragm.  Roxanna Mew, [place] 09/30 0805   Discussed with Dr Acie Fredrickson.  Keep her NPO.  They will see her Dorie Rank, MD 09/30 [phone]    Patient presents to ED with complaint of chest pain. Patient is afebrile and non-toxic appearing in NAD. Vital signs remarkable for elevated BP, otherwise stable. Physical exam is re-assuring. Wells 0, PERC negative - low suspicion for PE. CBC shows mild elevation of WBC, otherwise unremarkable. CBC unremarkable. CXR negative for acute cardiopulmonary process. EKG shows t-wave changes in lateral leads, inverted t-waves, compared to previous tracing. Initial troponin nml. Heart score 5. Will delta trop. Given hx, persistent chest pain, EKG changes, medication non-compliance, and heart score, pt likely for admission for ACS r/o. Will consult cardiology. Discussed patient with Dr. Tomi Bamberger, who also saw patient, agrees with plan. IV NTG  and heparin initiated.   9:00AM: Dr. Tomi Bamberger spoke with Dr. Cathie Olden of cardiology, greatly appreciate their time and input, agree to see patient.    10:11 AM: patient to be admitted for further evaluation and management of chest pain. Thank you Dr. Cathie Olden for your time and continued care of patient.    Final Clinical Impressions(s) / ED Diagnoses   Final diagnoses:  None    New Prescriptions New Prescriptions  No medications on file     Roxanna Mew, [place] 04/12/16 [place], MD 04/13/16 [phone]

## 2016-04-12 NOTE — ED Triage Notes (Signed)
Per ems- Pt woke up around 5 am to smoke a cigarette, before she could even begin she developed central CP. She took 1 nitro, with some relief. Continued to run some errands and drink coffee, but pain returned. EMS arrived given 2 nitro, 324 aspirin, improvement of CP 3/10. Moved here from [place] 1 year ago, is out of medications for 1 year. Has cardiac stents placed. BP 201/70, CBG 130. Pt is a x 4.

## 2016-04-12 NOTE — Progress Notes (Signed)
ANTICOAGULATION CONSULT NOTE - Initial Consult  Pharmacy Consult for heparin Indication: chest pain/ACS  Allergies  Allergen Reactions  . Drixoral Allergy Sinus [Dexbromphen-Pse-Apap Er] Rash    Patient Measurements:   Heparin Dosing Weight: 57.9kg -IBW 52.4   Vital Signs: Temp: 98.3 F (36.8 C) (09/30 0711) Temp Source: Oral (09/30 0711) BP: 153/92 (09/30 0815) Pulse Rate: 66 (09/30 0815)  Labs:  Recent Labs  04/12/16 0721  HGB 12.3  HCT 38.7  PLT 386  CREATININE 0.73    Estimated Creatinine Clearance: 88.2 mL/min (by C-G formula based on SCr of 0.73 mg/dL).   Medical History: Past Medical History:  Diagnosis Date  . Asthma   . Bipolar 1 disorder ([place])   . Depression   . Diabetes mellitus without complication ([place])   . GERD (gastroesophageal reflux disease)   . Hypertension    Assessment: 27 YOF with history of CAD and cardiac stent in 2010 that is non-compliant with meds and continues to smoke presenting with chest pain.   Goal of Therapy:  Heparin level 0.3-0.7 units/ml Monitor platelets by anticoagulation protocol: Yes   Plan:  -Heparin bolus 3500 units, then 700 units/hr  -6 hr HL at 1530 -Daily HL/CBC ordered -Monitor S/Sx bleeding  Myer Peer Grayland Ormond), PharmD  PGY1 Pharmacy Resident Pager: [phone] 04/12/2016 9:01 AM

## 2016-04-13 DIAGNOSIS — Z9114 Patient's other noncompliance with medication regimen: Secondary | ICD-10-CM | POA: Diagnosis not present

## 2016-04-13 DIAGNOSIS — R079 Chest pain, unspecified: Secondary | ICD-10-CM | POA: Diagnosis present

## 2016-04-13 DIAGNOSIS — F319 Bipolar disorder, unspecified: Secondary | ICD-10-CM | POA: Diagnosis present

## 2016-04-13 DIAGNOSIS — K219 Gastro-esophageal reflux disease without esophagitis: Secondary | ICD-10-CM

## 2016-04-13 DIAGNOSIS — E1159 Type 2 diabetes mellitus with other circulatory complications: Secondary | ICD-10-CM | POA: Diagnosis not present

## 2016-04-13 DIAGNOSIS — I25110 Atherosclerotic heart disease of native coronary artery with unstable angina pectoris: Secondary | ICD-10-CM | POA: Diagnosis present

## 2016-04-13 DIAGNOSIS — Z7982 Long term (current) use of aspirin: Secondary | ICD-10-CM | POA: Diagnosis not present

## 2016-04-13 DIAGNOSIS — Z955 Presence of coronary angioplasty implant and graft: Secondary | ICD-10-CM | POA: Diagnosis not present

## 2016-04-13 DIAGNOSIS — J45909 Unspecified asthma, uncomplicated: Secondary | ICD-10-CM | POA: Diagnosis present

## 2016-04-13 DIAGNOSIS — Z9119 Patient's noncompliance with other medical treatment and regimen: Secondary | ICD-10-CM | POA: Diagnosis not present

## 2016-04-13 DIAGNOSIS — R402252 Coma scale, best verbal response, oriented, at arrival to emergency department: Secondary | ICD-10-CM | POA: Diagnosis present

## 2016-04-13 DIAGNOSIS — R0789 Other chest pain: Secondary | ICD-10-CM | POA: Diagnosis not present

## 2016-04-13 DIAGNOSIS — I25700 Atherosclerosis of coronary artery bypass graft(s), unspecified, with unstable angina pectoris: Secondary | ICD-10-CM | POA: Diagnosis not present

## 2016-04-13 DIAGNOSIS — I200 Unstable angina: Secondary | ICD-10-CM | POA: Diagnosis not present

## 2016-04-13 DIAGNOSIS — E785 Hyperlipidemia, unspecified: Secondary | ICD-10-CM | POA: Diagnosis present

## 2016-04-13 DIAGNOSIS — I2510 Atherosclerotic heart disease of native coronary artery without angina pectoris: Secondary | ICD-10-CM | POA: Diagnosis not present

## 2016-04-13 DIAGNOSIS — R402362 Coma scale, best motor response, obeys commands, at arrival to emergency department: Secondary | ICD-10-CM | POA: Diagnosis present

## 2016-04-13 DIAGNOSIS — Z8249 Family history of ischemic heart disease and other diseases of the circulatory system: Secondary | ICD-10-CM | POA: Diagnosis not present

## 2016-04-13 DIAGNOSIS — E119 Type 2 diabetes mellitus without complications: Secondary | ICD-10-CM | POA: Diagnosis present

## 2016-04-13 DIAGNOSIS — F17210 Nicotine dependence, cigarettes, uncomplicated: Secondary | ICD-10-CM | POA: Diagnosis present

## 2016-04-13 DIAGNOSIS — I10 Essential (primary) hypertension: Secondary | ICD-10-CM | POA: Diagnosis present

## 2016-04-13 DIAGNOSIS — Z7902 Long term (current) use of antithrombotics/antiplatelets: Secondary | ICD-10-CM | POA: Diagnosis not present

## 2016-04-13 DIAGNOSIS — R402142 Coma scale, eyes open, spontaneous, at arrival to emergency department: Secondary | ICD-10-CM | POA: Diagnosis present

## 2016-04-13 LAB — CBC
HCT: 40.5 % (ref 36.0–46.0)
Hemoglobin: 12.7 g/dL (ref 12.0–15.0)
MCH: 28.2 pg (ref 26.0–34.0)
MCHC: 31.4 g/dL (ref 30.0–36.0)
MCV: 90 fL (ref 78.0–100.0)
Platelets: 418 10*3/uL — ABNORMAL HIGH (ref 150–400)
RBC: 4.5 MIL/uL (ref 3.87–5.11)
RDW: 14.5 % (ref 11.5–15.5)
WBC: 12.3 10*3/uL — ABNORMAL HIGH (ref 4.0–10.5)

## 2016-04-13 LAB — HEPARIN LEVEL (UNFRACTIONATED)
Heparin Unfractionated: 0.2 IU/mL — ABNORMAL LOW (ref 0.30–0.70)
Heparin Unfractionated: 0.35 IU/mL (ref 0.30–0.70)

## 2016-04-13 LAB — TROPONIN I: Troponin I: <0.03 ng/mL (ref <0.03)

## 2016-04-13 MED ORDER — SODIUM CHLORIDE 0.9% FLUSH
3.0000 mL | Freq: Two times a day (BID) | INTRAVENOUS | Status: DC
  Administered 2016-04-14: 3 mL via INTRAVENOUS

## 2016-04-13 MED ORDER — SODIUM CHLORIDE 0.9% FLUSH: 3.0000 mL | INTRAVENOUS | Status: DC | PRN

## 2016-04-13 MED ORDER — SODIUM CHLORIDE 0.9 % WEIGHT BASED INFUSION: 1.0000 mL/kg/h | INTRAVENOUS | Status: DC

## 2016-04-13 MED ORDER — SODIUM CHLORIDE 0.9 % IV SOLN: 250.0000 mL | INTRAVENOUS | Status: DC | PRN

## 2016-04-13 MED ORDER — SODIUM CHLORIDE 0.9 % WEIGHT BASED INFUSION
3.0000 mL/kg/h | INTRAVENOUS | Status: DC
  Administered 2016-04-14: 3 mL/kg/h via INTRAVENOUS

## 2016-04-13 MED ORDER — HEPARIN (PORCINE) IN NACL 100-0.45 UNIT/ML-% IJ SOLN
1200.0000 [IU]/h | INTRAMUSCULAR | Status: DC
  Administered 2016-04-14: 1150 [IU]/h via INTRAVENOUS
  Filled 2016-04-13: qty 250

## 2016-04-13 MED ORDER — ALUM & MAG HYDROXIDE-SIMETH 200-200-20 MG/5ML PO SUSP
30.0000 mL | ORAL | Status: DC | PRN
  Administered 2016-04-13 – 2016-04-14 (×3): 30 mL via ORAL
  Filled 2016-04-13 (×3): qty 30

## 2016-04-13 MED ORDER — ASPIRIN 81 MG PO CHEW
81.0000 mg | CHEWABLE_TABLET | ORAL | Status: AC
  Administered 2016-04-14: 81 mg via ORAL
  Filled 2016-04-13: qty 1

## 2016-04-13 NOTE — Progress Notes (Signed)
[facility] for heparin Indication: chest pain/ACS  Allergies  Allergen Reactions  . Drixoral Allergy Sinus [Dexbromphen-Pse-Apap Er] Rash    Patient Measurements: Height: 5\' 3"  (160 cm) Weight: 186 lb 8.2 oz (84.6 kg) IBW/kg (Calculated) : 52.4 Heparin Dosing Weight: 71.2 kg   Vital Signs: Temp: 97.5 F (36.4 C) (09/30 2353) Temp Source: Oral (09/30 2353) BP: 123/54 (09/30 2353) Pulse Rate: 61 (09/30 2353)  Labs:  Recent Labs  04/12/16 0721 04/12/16 1132 04/12/16 1532 04/13/16 0015  HGB 12.3  --   --   --   HCT 38.7  --   --   --   PLT 386  --   --   --   HEPARINUNFRC  --   --  <0.10* 0.20*  CREATININE 0.73  --   --   --   TROPONINI  --  <0.03 <0.03  --     Estimated Creatinine Clearance: 88.7 mL/min (by C-G formula based on SCr of 0.73 mg/dL).  Assessment: 50 y.o. female with chest pain for heparin   Goal of Therapy:  Heparin level 0.3-0.7 units/ml Monitor platelets by anticoagulation protocol: Yes   Plan:  Increase Heparin  1150 units/hr Follow-up am labs.  Phillis Knack, PharmD, BCPS  04/13/2016,12:49 AM

## 2016-04-13 NOTE — Progress Notes (Signed)
    Primary cardiologist: Dr. Mertie Moores  Seen for followup: Chest pain, CAD  Subjective:    Mild chest tightness today, no shortness of breath at rest. No abdominal pain. No palpitations.  Objective:   Temp:  [97.1 F (36.2 C)-98.3 F (36.8 C)] 98.3 F (36.8 C) (10/01 0348) Pulse Rate:  [61-88] 62 (10/01 0348) Resp:  [11-23] 20 (10/01 0348) BP: (87-171)/(54-137) 131/76 (10/01 0348) SpO2:  [98 %-100 %] 100 % (10/01 0348) Weight:  [186 lb 8.2 oz (84.6 kg)-186 lb 15.2 oz (84.8 kg)] 186 lb 8.2 oz (84.6 kg) (09/30 1116) Last BM Date: 04/11/16  Filed Weights   04/12/16 1110 04/12/16 1116  Weight: 186 lb 15.2 oz (84.8 kg) 186 lb 8.2 oz (84.6 kg)    Intake/Output Summary (Last 24 hours) at 04/13/16 0802 Last data filed at 04/13/16 0500  Gross per 24 hour  Intake           477.23 ml  Output             1175 ml  Net          -697.77 ml    Telemetry: Sinus rhythm.  Exam:  General: No distress.  Lungs: Clear, nonlabored.  Cardiac: RRR, no gallop or rub.  Abdomen: NABS.  Extremities: No edema.  Lab Results:  Basic Metabolic Panel:  Recent Labs Lab 04/12/16 0721  NA 137  K 4.1  CL 106  CO2 23  GLUCOSE 118*  BUN 18  CREATININE 0.73  CALCIUM 8.8*    CBC:  Recent Labs Lab 04/12/16 0721  WBC 13.9*  HGB 12.3  HCT 38.7  MCV 89.8  PLT 386    Cardiac Enzymes:  Recent Labs Lab 04/12/16 0015 04/12/16 1132 04/12/16 1532  TROPONINI <0.03 <0.03 <0.03   ECG: Tracing from 04/12/2016 shows sinus rhythm with NSST abnormalities.  Chest x-ray 04/12/2016: FINDINGS: The heart size and mediastinal contours are within normal limits. Both lungs are clear. No pleural effusion or pneumothorax. The visualized skeletal structures are unremarkable.  IMPRESSION: No active cardiopulmonary disease.  Medications:   Scheduled Medications: . aspirin EC  81 mg Oral Daily  . clopidogrel  75 mg Oral Daily  . famotidine  40 mg Oral Daily  . fluticasone  furoate-vilanterol  1 puff Inhalation Daily  . lisinopril  10 mg Oral Daily  . metoprolol tartrate  50 mg Oral BID  . pantoprazole  40 mg Oral Daily  . regadenoson  0.4 mg Intravenous Once  . sertraline  50 mg Oral Daily  . traZODone  150 mg Oral QHS     Infusions: . nitroGLYCERIN 10 mcg/min (04/12/16 1946)     PRN Medications:  acetaminophen, albuterol, alum & mag hydroxide-simeth, ondansetron (ZOFRAN) IV   Assessment:   1. Chest pain concerning for unstable angina, troponin I levels negative. ECG with NSST changes.  2. CAD status post BMS to circumflex August 2010 in [place].  3. Recent noncompliance with medications.  4. Type 2 diabetes mellitus.  5. GERD, now on PPI.  6. Bipolar disorder.   Plan/Discussion:    Reviewed history and discussed with patient. Although negative cardiac markers, she has had recurrent chest pain on Heparin and NTG. Will cancel Myoview and schedule cardiac catheterization for tomorrow. She is in agreement to proceed.   Satira Sark, M.D., F.A.C.C.

## 2016-04-13 NOTE — Progress Notes (Signed)
ANTICOAGULATION CONSULT NOTE - Follow Up Consult  Pharmacy Consult for heparin Indication: chest pain/ACS  Allergies  Allergen Reactions  . Drixoral Allergy Sinus [Dexbromphen-Pse-Apap Er] Rash    Patient Measurements: Height: 5\' 3"  (160 cm) Weight: 186 lb 8.2 oz (84.6 kg) IBW/kg (Calculated) : 52.4 Heparin Dosing Weight: 71.2 kg   Vital Signs: Temp: 98.5 F (36.9 C) (10/01 0810) Temp Source: Oral (10/01 0810) BP: 151/92 (10/01 0810) Pulse Rate: 61 (10/01 0810)  Labs:  Recent Labs  04/12/16 0015 04/12/16 0721 04/12/16 1132 04/12/16 1532 04/13/16 0015  HGB  --  12.3  --   --   --   HCT  --  38.7  --   --   --   PLT  --  386  --   --   --   HEPARINUNFRC  --   --   --  <0.10* 0.20*  CREATININE  --  0.73  --   --   --   TROPONINI <0.03  --  <0.03 <0.03  --     Estimated Creatinine Clearance: 88.7 mL/min (by C-G formula based on SCr of 0.73 mg/dL).   Medications:  Prescriptions Prior to Admission  Medication Sig Dispense Refill Last Dose  . albuterol (PROVENTIL HFA;VENTOLIN HFA) 108 (90 BASE) MCG/ACT inhaler Inhale 2 puffs into the lungs every 4 (four) hours as needed for wheezing or shortness of breath.   unk  . HYDROcodone-acetaminophen (NORCO) 5-325 MG tablet Take 1 tablet by mouth every 4 (four) hours as needed for moderate pain. 20 tablet 0 Past Month at Unknown time  . promethazine (PHENERGAN) 25 MG suppository Place 1 suppository (25 mg total) rectally every 6 (six) hours as needed for nausea. 12 suppository 0 unk  . traMADol (ULTRAM) 50 MG tablet Take 1 tablet (50 mg total) by mouth every 6 (six) hours as needed. (Patient taking differently: Take 50 mg by mouth every 6 (six) hours as needed for moderate pain. ) 12 tablet 0 unk  . traZODone (DESYREL) 150 MG tablet Take 150 mg by mouth at bedtime.   Past Week at Unknown time  . aspirin 81 MG tablet Take 81 mg by mouth daily.   Past Week at Unknown time  . clopidogrel (PLAVIX) 75 MG tablet Take 75 mg by mouth  daily.   Past Week at Unknown time  . divalproex (DEPAKOTE ER) 500 MG 24 hr tablet Take 500-1,000 mg by mouth 4 (four) times daily. 1 tablet AM, NOON, PM and 2 at bedtime   Past Week at Unknown time  . famotidine (PEPCID) 40 MG tablet Take 40 mg by mouth daily.   Past Week at Unknown time  . Fluticasone Furoate-Vilanterol (BREO ELLIPTA IN) Inhale 1 puff into the lungs 2 (two) times daily.   Past Week at Unknown time  . lisinopril (PRINIVIL,ZESTRIL) 10 MG tablet Take 10 mg by mouth daily.   Past Week at Unknown time  . metoprolol (LOPRESSOR) 100 MG tablet Take 100 mg by mouth 2 (two) times daily.   Past Week at Unknown time  . pantoprazole (PROTONIX) 40 MG tablet Take 40 mg by mouth daily.   Past Week at Unknown time  . sertraline (ZOLOFT) 50 MG tablet Take 50 mg by mouth daily.   Past Week at Unknown time    Assessment: 50 yo F admitted 04/12/2016 with chest pain and hypertension. She has a history of CAD (s/p LCx with stenting in 2010). Heparin was discontinued, however, pt continues to have chest pain  and current plans are for cardiac cath tomorrow.  Pharmacy has been consulted to dose heparin. CBC stable.   Goal of Therapy:  Heparin level 0.3-0.7 units/ml Monitor platelets by anticoagulation protocol: Yes   Plan:  Restart heparin at 1150 units/hr 6hr heparin level Monitor daily heparin level, CBC and s/sx of bleeding   Angela Burke, PharmD Pharmacy Resident Pager: [phone] 04/13/2016,8:26 AM

## 2016-04-13 NOTE — Progress Notes (Signed)
ANTICOAGULATION CONSULT NOTE - Follow Up Consult  Pharmacy Consult for heparin Indication: chest pain/ACS  Allergies  Allergen Reactions  . Drixoral Allergy Sinus [Dexbromphen-Pse-Apap Er] Rash    Patient Measurements: Height: 5\' 3"  (160 cm) Weight: 186 lb 8.2 oz (84.6 kg) IBW/kg (Calculated) : 52.4 Heparin Dosing Weight: 71.2 kg   Vital Signs: Temp: 98.3 F (36.8 C) (10/01 1100) Temp Source: Oral (10/01 1100) BP: 143/78 (10/01 1100) Pulse Rate: 65 (10/01 1100)  Labs:  Recent Labs  04/12/16 0015 04/12/16 0721 04/12/16 1132 04/12/16 1532 04/13/16 0015 04/13/16 0917 04/13/16 1451  HGB  --  12.3  --   --   --  12.7  --   HCT  --  38.7  --   --   --  40.5  --   PLT  --  386  --   --   --  418*  --   HEPARINUNFRC  --   --   --  <0.10* 0.20*  --  0.35  CREATININE  --  0.73  --   --   --   --   --   TROPONINI <0.03  --  <0.03 <0.03  --   --   --     Estimated Creatinine Clearance: 88.7 mL/min (by C-G formula based on SCr of 0.73 mg/dL).   Medications:  Prescriptions Prior to Admission  Medication Sig Dispense Refill Last Dose  . albuterol (PROVENTIL HFA;VENTOLIN HFA) 108 (90 BASE) MCG/ACT inhaler Inhale 2 puffs into the lungs every 4 (four) hours as needed for wheezing or shortness of breath.   unk  . HYDROcodone-acetaminophen (NORCO) 5-325 MG tablet Take 1 tablet by mouth every 4 (four) hours as needed for moderate pain. 20 tablet 0 Past Month at Unknown time  . promethazine (PHENERGAN) 25 MG suppository Place 1 suppository (25 mg total) rectally every 6 (six) hours as needed for nausea. 12 suppository 0 unk  . traMADol (ULTRAM) 50 MG tablet Take 1 tablet (50 mg total) by mouth every 6 (six) hours as needed. (Patient taking differently: Take 50 mg by mouth every 6 (six) hours as needed for moderate pain. ) 12 tablet 0 unk  . traZODone (DESYREL) 150 MG tablet Take 150 mg by mouth at bedtime.   Past Week at Unknown time  . aspirin 81 MG tablet Take 81 mg by mouth daily.    Past Week at Unknown time  . clopidogrel (PLAVIX) 75 MG tablet Take 75 mg by mouth daily.   Past Week at Unknown time  . divalproex (DEPAKOTE ER) 500 MG 24 hr tablet Take 500-1,000 mg by mouth 4 (four) times daily. 1 tablet AM, NOON, PM and 2 at bedtime   Past Week at Unknown time  . famotidine (PEPCID) 40 MG tablet Take 40 mg by mouth daily.   Past Week at Unknown time  . Fluticasone Furoate-Vilanterol (BREO ELLIPTA IN) Inhale 1 puff into the lungs 2 (two) times daily.   Past Week at Unknown time  . lisinopril (PRINIVIL,ZESTRIL) 10 MG tablet Take 10 mg by mouth daily.   Past Week at Unknown time  . metoprolol (LOPRESSOR) 100 MG tablet Take 100 mg by mouth 2 (two) times daily.   Past Week at Unknown time  . pantoprazole (PROTONIX) 40 MG tablet Take 40 mg by mouth daily.   Past Week at Unknown time  . sertraline (ZOLOFT) 50 MG tablet Take 50 mg by mouth daily.   Past Week at Unknown time    Assessment:  50 yo F admitted 04/12/2016 with chest pain and hypertension. She has a history of CAD (s/p LCx with stenting in 2010). Heparin was discontinued, however, pt continues to have chest pain and current plans are for cardiac cath tomorrow.  Pharmacy has been consulted to dose heparin. CBC stable.   Heparin level = 0.35  Goal of Therapy:  Heparin level 0.3-0.7 units/ml Monitor platelets by anticoagulation protocol: Yes   Plan:  Continue heparin at 1150 units / hr Follow up AM labs  Thank you Anette Guarneri, PharmD [phone]  04/13/2016,3:42 PM

## 2016-04-14 ENCOUNTER — Encounter (HOSPITAL_COMMUNITY)
Admission: EM | Disposition: A | Discharge status: Home / Self Care | Payer: Self-pay | Source: Home / Self Care | Attending: Cardiovascular Disease

## 2016-04-14 DIAGNOSIS — I2510 Atherosclerotic heart disease of native coronary artery without angina pectoris: Secondary | ICD-10-CM

## 2016-04-14 HISTORY — PX: CARDIAC CATHETERIZATION: SHX172

## 2016-04-14 LAB — BASIC METABOLIC PANEL
Anion gap: 8 (ref 5–15)
BUN: 12 mg/dL (ref 6–20)
CO2: 25 mmol/L (ref 22–32)
Calcium: 8.9 mg/dL (ref 8.9–10.3)
Chloride: 103 mmol/L (ref 101–111)
Creatinine, Ser: 0.82 mg/dL (ref 0.44–1.00)
GFR calc Af Amer: >60 mL/min (ref >60)
GFR calc non Af Amer: >60 mL/min (ref >60)
Glucose, Bld: 108 mg/dL — ABNORMAL HIGH (ref 65–99)
Potassium: 4 mmol/L (ref 3.5–5.1)
Sodium: 136 mmol/L (ref 135–145)

## 2016-04-14 LAB — CBC
HCT: 38.4 % (ref 36.0–46.0)
Hemoglobin: 12.3 g/dL (ref 12.0–15.0)
MCH: 28.9 pg (ref 26.0–34.0)
MCHC: 32 g/dL (ref 30.0–36.0)
MCV: 90.1 fL (ref 78.0–100.0)
Platelets: 387 10*3/uL (ref 150–400)
RBC: 4.26 MIL/uL (ref 3.87–5.11)
RDW: 14.4 % (ref 11.5–15.5)
WBC: 12.8 10*3/uL — ABNORMAL HIGH (ref 4.0–10.5)

## 2016-04-14 LAB — PROTIME-INR
INR: 0.98
Prothrombin Time: 13 seconds (ref 11.4–15.2)

## 2016-04-14 LAB — HEPARIN LEVEL (UNFRACTIONATED): Heparin Unfractionated: 0.3 IU/mL (ref 0.30–0.70)

## 2016-04-14 SURGERY — LEFT HEART CATH AND CORONARY ANGIOGRAPHY
Anesthesia: LOCAL

## 2016-04-14 MED ORDER — HEPARIN SODIUM (PORCINE) 1000 UNIT/ML IJ SOLN
INTRAMUSCULAR | Status: DC | PRN
  Administered 2016-04-14: 4200 [IU] via INTRAVENOUS

## 2016-04-14 MED ORDER — IOPAMIDOL (ISOVUE-370) INJECTION 76%
INTRAVENOUS | Status: DC | PRN
Start: 2016-04-14 — End: 2016-04-14
  Administered 2016-04-14: 90 mL via INTRAVENOUS

## 2016-04-14 MED ORDER — LIDOCAINE HCL (PF) 1 % IJ SOLN
INTRAMUSCULAR | Status: DC | PRN
  Administered 2016-04-14: 2 mL

## 2016-04-14 MED ORDER — SODIUM CHLORIDE 0.9 % IV SOLN: 250.0000 mL | INTRAVENOUS | Status: DC | PRN

## 2016-04-14 MED ORDER — HEPARIN (PORCINE) IN NACL 2-0.9 UNIT/ML-% IJ SOLN
INTRAMUSCULAR | Status: AC
  Filled 2016-04-14: qty 1000

## 2016-04-14 MED ORDER — HEPARIN SODIUM (PORCINE) 1000 UNIT/ML IJ SOLN
INTRAMUSCULAR | Status: AC
  Filled 2016-04-14: qty 1

## 2016-04-14 MED ORDER — ANGIOPLASTY BOOK
Freq: Once | Status: AC
  Administered 2016-04-14: 22:00:00
  Filled 2016-04-14: qty 1

## 2016-04-14 MED ORDER — SODIUM CHLORIDE 0.9% FLUSH: 3.0000 mL | Freq: Two times a day (BID) | INTRAVENOUS | Status: DC

## 2016-04-14 MED ORDER — MIDAZOLAM HCL 2 MG/2ML IJ SOLN
INTRAMUSCULAR | Status: AC
  Filled 2016-04-14: qty 2

## 2016-04-14 MED ORDER — FENTANYL CITRATE (PF) 100 MCG/2ML IJ SOLN
INTRAMUSCULAR | Status: AC
  Filled 2016-04-14: qty 2

## 2016-04-14 MED ORDER — VERAPAMIL HCL 2.5 MG/ML IV SOLN
INTRAVENOUS | Status: AC
  Filled 2016-04-14: qty 2

## 2016-04-14 MED ORDER — LIDOCAINE HCL (PF) 1 % IJ SOLN
INTRAMUSCULAR | Status: AC
  Filled 2016-04-14: qty 30

## 2016-04-14 MED ORDER — ATORVASTATIN CALCIUM 40 MG PO TABS
40.0000 mg | ORAL_TABLET | Freq: Every day | ORAL | Status: DC
  Administered 2016-04-14: 40 mg via ORAL
  Filled 2016-04-14: qty 1

## 2016-04-14 MED ORDER — FENTANYL CITRATE (PF) 100 MCG/2ML IJ SOLN
INTRAMUSCULAR | Status: DC | PRN
  Administered 2016-04-14: 50 ug via INTRAVENOUS
  Administered 2016-04-14: 25 ug via INTRAVENOUS

## 2016-04-14 MED ORDER — VERAPAMIL HCL 2.5 MG/ML IV SOLN
INTRAVENOUS | Status: DC | PRN
  Administered 2016-04-14: 10 mL via INTRA_ARTERIAL

## 2016-04-14 MED ORDER — SODIUM CHLORIDE 0.9% FLUSH: 3.0000 mL | INTRAVENOUS | Status: DC | PRN

## 2016-04-14 MED ORDER — HEPARIN (PORCINE) IN NACL 2-0.9 UNIT/ML-% IJ SOLN
INTRAMUSCULAR | Status: DC | PRN
  Administered 2016-04-14: 1000 mL

## 2016-04-14 MED ORDER — MIDAZOLAM HCL 2 MG/2ML IJ SOLN
INTRAMUSCULAR | Status: DC | PRN
  Administered 2016-04-14: 1 mg via INTRAVENOUS
  Administered 2016-04-14: 2 mg via INTRAVENOUS

## 2016-04-14 MED ORDER — IOPAMIDOL (ISOVUE-370) INJECTION 76%
INTRAVENOUS | Status: AC
  Filled 2016-04-14: qty 100

## 2016-04-14 MED ORDER — SODIUM CHLORIDE 0.9 % IV SOLN: INTRAVENOUS | Status: AC

## 2016-04-14 SURGICAL SUPPLY — 12 items

## 2016-04-14 NOTE — Care Management Note (Signed)
Case Management Note  Patient Details  Name: Shawna Anderson MRN: [id number] Date of Birth: 12-19-1965  Subjective/Objective:    S/p cath, on plavix, NCM will cont to follow for dc needs.                Action/Plan:   Expected Discharge Date:  04/14/16               Expected Discharge Plan:  Home/Self Care  In-House Referral:     Discharge planning Services  CM Consult  Post Acute Care Choice:    Choice offered to:     DME Arranged:    DME Agency:     HH Arranged:    HH Agency:     Status of Service:  In process, will continue to follow  If discussed at Long Length of Stay Meetings, dates discussed:    Additional Comments:  Zenon Mayo, RN 04/14/2016, 9:29 PM

## 2016-04-14 NOTE — Progress Notes (Signed)
Patient Name: Shawna Anderson Date of Encounter: 04/14/2016  Primary Cardiologist: Dr. Antonieta Loveless Problem List     Active Problems:   Chest pain     Subjective   Has had recurrent CP on Heparin IV. Sleeping well now  Inpatient Medications    Scheduled Meds: . aspirin EC  81 mg Oral Daily  . clopidogrel  75 mg Oral Daily  . famotidine  40 mg Oral Daily  . fluticasone furoate-vilanterol  1 puff Inhalation Daily  . lisinopril  10 mg Oral Daily  . metoprolol tartrate  50 mg Oral BID  . pantoprazole  40 mg Oral Daily  . regadenoson  0.4 mg Intravenous Once  . sertraline  50 mg Oral Daily  . sodium chloride flush  3 mL Intravenous Q12H  . traZODone  150 mg Oral QHS   Continuous Infusions: . sodium chloride 1 mL/kg/hr (04/14/16 0700)  . heparin 1,150 Units/hr (04/14/16 0214)  . nitroGLYCERIN 10 mcg/min (04/13/16 2000)   PRN Meds:.sodium chloride, acetaminophen, albuterol, alum & mag hydroxide-simeth, ondansetron (ZOFRAN) IV, sodium chloride flush   Vital Signs    Vitals:   04/13/16 2356 04/14/16 0314 04/14/16 0527 04/14/16 0800  BP: 122/76 118/72  122/75  Pulse: (!) 52 (!) 53    Resp: 15   14  Temp: 97.5 F (36.4 C) 97.6 F (36.4 C)  98.4 F (36.9 C)  TempSrc: Oral Oral  Oral  SpO2: 95% 97%  97%  Weight:   185 lb 4.8 oz (84.1 kg)   Height:        Intake/Output Summary (Last 24 hours) at 04/14/16 0854 Last data filed at 04/14/16 0700  Gross per 24 hour  Intake              908 ml  Output             1250 ml  Net             -342 ml   Filed Weights   04/12/16 1110 04/12/16 1116 04/14/16 0527  Weight: 186 lb 15.2 oz (84.8 kg) 186 lb 8.2 oz (84.6 kg) 185 lb 4.8 oz (84.1 kg)    Physical Exam    GEN: Well nourished, well developed, in no acute distress.  HEENT: Grossly normal.  Neck: Supple, no JVD, carotid bruits, or masses. Cardiac: RRR, no murmurs, rubs, or gallops. No clubbing, cyanosis, edema.  Radials/DP/PT 2+ and equal bilaterally.    Respiratory:  Respirations regular and unlabored, clear to auscultation bilaterally. GI: Soft, nontender, nondistended, BS + x 4. MS: no deformity or atrophy. Skin: warm and dry, no rash. Neuro:  Strength and sensation are intact. Psych: AAOx3.  Normal affect.  Labs    CBC  Recent Labs  04/13/16 0917 04/14/16 0223  WBC 12.3* 12.8*  HGB 12.7 12.3  HCT 40.5 38.4  MCV 90.0 90.1  PLT 418* [id number]   Basic Metabolic Panel  Recent Labs  04/12/16 0721 04/14/16 0223  NA 137 136  K 4.1 4.0  CL 106 103  CO2 23 25  GLUCOSE 118* 108*  BUN 18 12  CREATININE 0.73 0.82  CALCIUM 8.8* 8.9   Liver Function Tests No results for input(s): AST, ALT, ALKPHOS, BILITOT, PROT, ALBUMIN in the last 72 hours. No results for input(s): LIPASE, AMYLASE in the last 72 hours. Cardiac Enzymes  Recent Labs  04/12/16 0015 04/12/16 1132 04/12/16 1532  TROPONINI <0.03 <0.03 <0.03   BNP Invalid input(s): POCBNP D-Dimer No results  for input(s): DDIMER in the last 72 hours. Hemoglobin A1C No results for input(s): HGBA1C in the last 72 hours. Fasting Lipid Panel No results for input(s): CHOL, HDL, LDLCALC, TRIG, CHOLHDL, LDLDIRECT in the last 72 hours. Thyroid Function Tests No results for input(s): TSH, T4TOTAL, T3FREE, THYROIDAB in the last 72 hours.  Invalid input(s): FREET3  Telemetry    Sinus brady, no adverse rhythms - Personally Reviewed  ECG    SB NSSTW changes- Personally Reviewed  Radiology    No results found.   Cardiac Studies   Cath pending  Patient Profile     50 year old with known CAD:  1. Chest pain concerning for unstable angina, troponin I levels negative. ECG with NSST changes.  2. CAD status post BMS to circumflex August 2010 in [place].  3. Recent noncompliance with medications.  4. Type 2 diabetes mellitus.  5. GERD, now on PPI.  6. Bipolar disorder.  Assessment & Plan     - Cath pending.   - Continue ASA Plavix, low dose metoprolol  (limited by bradycardia)  - Will add atorvastatin 40. (She states she was supposed to be taking but not)   Signed, Candee Furbish, MD  04/14/2016, 8:54 AM

## 2016-04-14 NOTE — Interval H&P Note (Signed)
History and Physical Interval Note:  04/14/2016 3:17 PM  [employer]  has presented today for cardiac cath with the diagnosis of unstable angina  The various methods of treatment have been discussed with the patient and family. After consideration of risks, benefits and other options for treatment, the patient has consented to  Procedure(s): Left Heart Cath and Coronary Angiography (N/A) as a surgical intervention .  The patient's history has been reviewed, patient examined, no change in status, stable for surgery.  I have reviewed the patient's chart and labs.  Questions were answered to the patient's satisfaction.    Cath Lab Visit (complete for each Cath Lab visit)  Clinical Evaluation Leading to the Procedure:   ACS: No.  Non-ACS:    Anginal Classification: CCS III  Anti-ischemic medical therapy: No Therapy  Non-Invasive Test Results: No non-invasive testing performed  Prior CABG: No previous CABG         Lauree Chandler

## 2016-04-14 NOTE — Progress Notes (Signed)
TR BAND REMOVAL  LOCATION:    right radial  DEFLATED PER PROTOCOL:    Yes.    TIME BAND OFF / DRESSING APPLIED:    1830   SITE UPON ARRIVAL:    Level 0  SITE AFTER BAND REMOVAL:    Level 1 (  Small bruise, soft)  CIRCULATION SENSATION AND MOVEMENT:    Within Normal Limits   Yes.    COMMENTS:   Tolerated procedure well , post TRB instructions given

## 2016-04-14 NOTE — Progress Notes (Signed)
ANTICOAGULATION CONSULT NOTE - Follow Up Consult  Pharmacy Consult for heparin Indication: chest pain/ACS  Allergies  Allergen Reactions  . Drixoral Allergy Sinus [Dexbromphen-Pse-Apap Er] Rash    Patient Measurements: Height: 5\' 3"  (160 cm) Weight: 185 lb 4.8 oz (84.1 kg) IBW/kg (Calculated) : 52.4 Heparin Dosing Weight: 71.2 kg   Vital Signs: Temp: 98.4 F (36.9 C) (10/02 0800) Temp Source: Oral (10/02 0800) BP: 122/75 (10/02 0800) Pulse Rate: 53 (10/02 0314)  Labs:  Recent Labs  04/12/16 0015  04/12/16 0721 04/12/16 1132  04/12/16 1532 04/13/16 0015 04/13/16 0917 04/13/16 1451 04/14/16 0223  HGB  --   < > 12.3  --   --   --   --  12.7  --  12.3  HCT  --   --  38.7  --   --   --   --  40.5  --  38.4  PLT  --   --  386  --   --   --   --  418*  --  387  LABPROT  --   --   --   --   --   --   --   --   --  13.0  INR  --   --   --   --   --   --   --   --   --  0.98  HEPARINUNFRC  --   --   --   --   < > <0.10* 0.20*  --  0.35 0.30  CREATININE  --   --  0.73  --   --   --   --   --   --  0.82  TROPONINI <0.03  --   --  <0.03  --  <0.03  --   --   --   --   < > = values in this interval not displayed.  Estimated Creatinine Clearance: 86.2 mL/min (by C-G formula based on SCr of 0.82 mg/dL).   Medications:  Prescriptions Prior to Admission  Medication Sig Dispense Refill Last Dose  . albuterol (PROVENTIL HFA;VENTOLIN HFA) 108 (90 BASE) MCG/ACT inhaler Inhale 2 puffs into the lungs every 4 (four) hours as needed for wheezing or shortness of breath.   unk  . HYDROcodone-acetaminophen (NORCO) 5-325 MG tablet Take 1 tablet by mouth every 4 (four) hours as needed for moderate pain. 20 tablet 0 Past Month at Unknown time  . promethazine (PHENERGAN) 25 MG suppository Place 1 suppository (25 mg total) rectally every 6 (six) hours as needed for nausea. 12 suppository 0 unk  . traMADol (ULTRAM) 50 MG tablet Take 1 tablet (50 mg total) by mouth every 6 (six) hours as needed.  (Patient taking differently: Take 50 mg by mouth every 6 (six) hours as needed for moderate pain. ) 12 tablet 0 unk  . traZODone (DESYREL) 150 MG tablet Take 150 mg by mouth at bedtime.   Past Week at Unknown time  . aspirin 81 MG tablet Take 81 mg by mouth daily.   Past Week at Unknown time  . clopidogrel (PLAVIX) 75 MG tablet Take 75 mg by mouth daily.   Past Week at Unknown time  . divalproex (DEPAKOTE ER) 500 MG 24 hr tablet Take 500-1,000 mg by mouth 4 (four) times daily. 1 tablet AM, NOON, PM and 2 at bedtime   Past Week at Unknown time  . famotidine (PEPCID) 40 MG tablet Take 40 mg by mouth daily.  Past Week at Unknown time  . Fluticasone Furoate-Vilanterol (BREO ELLIPTA IN) Inhale 1 puff into the lungs 2 (two) times daily.   Past Week at Unknown time  . lisinopril (PRINIVIL,ZESTRIL) 10 MG tablet Take 10 mg by mouth daily.   Past Week at Unknown time  . metoprolol (LOPRESSOR) 100 MG tablet Take 100 mg by mouth 2 (two) times daily.   Past Week at Unknown time  . pantoprazole (PROTONIX) 40 MG tablet Take 40 mg by mouth daily.   Past Week at Unknown time  . sertraline (ZOLOFT) 50 MG tablet Take 50 mg by mouth daily.   Past Week at Unknown time    Assessment: 50 yo F admitted 04/12/2016 with chest pain and hypertension. She has a history of CAD (s/p LCx with stenting in 2010). Heparin was discontinued, however, pt continues to have chest pain and current plans are for cardiac cath this AM. Pharmacy has been consulted to dose heparin. CBC stable. Not on anticoagulation PTA.  Heparin level = 0.3 this AM - therapeutic but at bottom of range. No issues with IV line or bleeding per RN.  Goal of Therapy:  Heparin level 0.3-0.7 units/ml Monitor platelets by anticoagulation protocol: Yes   Plan:  Increase heparin slightly to 1200 units/h to keep in range Daily heparin level/CBC Monitor for s/sx bleeding Cath for today  Elicia Lamp, PharmD, BCPS Clinical Pharmacist 04/14/2016 8:32 AM

## 2016-04-14 NOTE — H&P (View-Only) (Signed)
Patient Name: Shawna Anderson Date of Encounter: 04/14/2016  Primary Cardiologist: Dr. Antonieta Loveless Problem List     Active Problems:   Chest pain     Subjective   Has had recurrent CP on Heparin IV. Sleeping well now  Inpatient Medications    Scheduled Meds: . aspirin EC  81 mg Oral Daily  . clopidogrel  75 mg Oral Daily  . famotidine  40 mg Oral Daily  . fluticasone furoate-vilanterol  1 puff Inhalation Daily  . lisinopril  10 mg Oral Daily  . metoprolol tartrate  50 mg Oral BID  . pantoprazole  40 mg Oral Daily  . regadenoson  0.4 mg Intravenous Once  . sertraline  50 mg Oral Daily  . sodium chloride flush  3 mL Intravenous Q12H  . traZODone  150 mg Oral QHS   Continuous Infusions: . sodium chloride 1 mL/kg/hr (04/14/16 0700)  . heparin 1,150 Units/hr (04/14/16 0214)  . nitroGLYCERIN 10 mcg/min (04/13/16 2000)   PRN Meds:.sodium chloride, acetaminophen, albuterol, alum & mag hydroxide-simeth, ondansetron (ZOFRAN) IV, sodium chloride flush   Vital Signs    Vitals:   04/13/16 2356 04/14/16 0314 04/14/16 0527 04/14/16 0800  BP: 122/76 118/72  122/75  Pulse: (!) 52 (!) 53    Resp: 15   14  Temp: 97.5 F (36.4 C) 97.6 F (36.4 C)  98.4 F (36.9 C)  TempSrc: Oral Oral  Oral  SpO2: 95% 97%  97%  Weight:   185 lb 4.8 oz (84.1 kg)   Height:        Intake/Output Summary (Last 24 hours) at 04/14/16 0854 Last data filed at 04/14/16 0700  Gross per 24 hour  Intake              908 ml  Output             1250 ml  Net             -342 ml   Filed Weights   04/12/16 1110 04/12/16 1116 04/14/16 0527  Weight: 186 lb 15.2 oz (84.8 kg) 186 lb 8.2 oz (84.6 kg) 185 lb 4.8 oz (84.1 kg)    Physical Exam    GEN: Well nourished, well developed, in no acute distress.  HEENT: Grossly normal.  Neck: Supple, no JVD, carotid bruits, or masses. Cardiac: RRR, no murmurs, rubs, or gallops. No clubbing, cyanosis, edema.  Radials/DP/PT 2+ and equal bilaterally.    Respiratory:  Respirations regular and unlabored, clear to auscultation bilaterally. GI: Soft, nontender, nondistended, BS + x 4. MS: no deformity or atrophy. Skin: warm and dry, no rash. Neuro:  Strength and sensation are intact. Psych: AAOx3.  Normal affect.  Labs    CBC  Recent Labs  04/13/16 0917 04/14/16 0223  WBC 12.3* 12.8*  HGB 12.7 12.3  HCT 40.5 38.4  MCV 90.0 90.1  PLT 418* [id number]   Basic Metabolic Panel  Recent Labs  04/12/16 0721 04/14/16 0223  NA 137 136  K 4.1 4.0  CL 106 103  CO2 23 25  GLUCOSE 118* 108*  BUN 18 12  CREATININE 0.73 0.82  CALCIUM 8.8* 8.9   Liver Function Tests No results for input(s): AST, ALT, ALKPHOS, BILITOT, PROT, ALBUMIN in the last 72 hours. No results for input(s): LIPASE, AMYLASE in the last 72 hours. Cardiac Enzymes  Recent Labs  04/12/16 0015 04/12/16 1132 04/12/16 1532  TROPONINI <0.03 <0.03 <0.03   BNP Invalid input(s): POCBNP D-Dimer No results  for input(s): DDIMER in the last 72 hours. Hemoglobin A1C No results for input(s): HGBA1C in the last 72 hours. Fasting Lipid Panel No results for input(s): CHOL, HDL, LDLCALC, TRIG, CHOLHDL, LDLDIRECT in the last 72 hours. Thyroid Function Tests No results for input(s): TSH, T4TOTAL, T3FREE, THYROIDAB in the last 72 hours.  Invalid input(s): FREET3  Telemetry    Sinus brady, no adverse rhythms - Personally Reviewed  ECG    SB NSSTW changes- Personally Reviewed  Radiology    No results found.   Cardiac Studies   Cath pending  Patient Profile     50 year old with known CAD:  1. Chest pain concerning for unstable angina, troponin I levels negative. ECG with NSST changes.  2. CAD status post BMS to circumflex August 2010 in [place].  3. Recent noncompliance with medications.  4. Type 2 diabetes mellitus.  5. GERD, now on PPI.  6. Bipolar disorder.  Assessment & Plan     - Cath pending.   - Continue ASA Plavix, low dose metoprolol  (limited by bradycardia)  - Will add atorvastatin 40. (She states she was supposed to be taking but not)   Signed, Candee Furbish, MD  04/14/2016, 8:54 AM

## 2016-04-15 ENCOUNTER — Encounter (HOSPITAL_COMMUNITY): Payer: Self-pay | Admitting: Cardiovascular Disease

## 2016-04-15 DIAGNOSIS — E1169 Type 2 diabetes mellitus with other specified complication: Secondary | ICD-10-CM | POA: Diagnosis present

## 2016-04-15 DIAGNOSIS — Z9114 Patient's other noncompliance with medication regimen: Secondary | ICD-10-CM

## 2016-04-15 DIAGNOSIS — I2510 Atherosclerotic heart disease of native coronary artery without angina pectoris: Secondary | ICD-10-CM

## 2016-04-15 DIAGNOSIS — F319 Bipolar disorder, unspecified: Secondary | ICD-10-CM | POA: Diagnosis present

## 2016-04-15 DIAGNOSIS — I10 Essential (primary) hypertension: Secondary | ICD-10-CM | POA: Diagnosis present

## 2016-04-15 DIAGNOSIS — Z9861 Coronary angioplasty status: Secondary | ICD-10-CM

## 2016-04-15 DIAGNOSIS — E785 Hyperlipidemia, unspecified: Secondary | ICD-10-CM | POA: Diagnosis present

## 2016-04-15 DIAGNOSIS — R0789 Other chest pain: Secondary | ICD-10-CM

## 2016-04-15 LAB — BASIC METABOLIC PANEL
Anion gap: 7 (ref 5–15)
BUN: 14 mg/dL (ref 6–20)
CO2: 24 mmol/L (ref 22–32)
Calcium: 8.8 mg/dL — ABNORMAL LOW (ref 8.9–10.3)
Chloride: 105 mmol/L (ref 101–111)
Creatinine, Ser: 0.88 mg/dL (ref 0.44–1.00)
GFR calc Af Amer: >60 mL/min (ref >60)
GFR calc non Af Amer: >60 mL/min (ref >60)
Glucose, Bld: 133 mg/dL — ABNORMAL HIGH (ref 65–99)
Potassium: 4.7 mmol/L (ref 3.5–5.1)
Sodium: 136 mmol/L (ref 135–145)

## 2016-04-15 LAB — CBC
HCT: 38.9 % (ref 36.0–46.0)
Hemoglobin: 12.3 g/dL (ref 12.0–15.0)
MCH: 28.5 pg (ref 26.0–34.0)
MCHC: 31.6 g/dL (ref 30.0–36.0)
MCV: 90 fL (ref 78.0–100.0)
Platelets: 380 10*3/uL (ref 150–400)
RBC: 4.32 MIL/uL (ref 3.87–5.11)
RDW: 14.6 % (ref 11.5–15.5)
WBC: 10.6 10*3/uL — ABNORMAL HIGH (ref 4.0–10.5)

## 2016-04-15 MED ORDER — CLOPIDOGREL BISULFATE 75 MG PO TABS: 75.0000 mg | ORAL_TABLET | Freq: Every day | ORAL | 3 refills | Status: DC

## 2016-04-15 MED ORDER — ACETAMINOPHEN 325 MG PO TABS: 650.0000 mg | ORAL_TABLET | ORAL | Status: DC | PRN

## 2016-04-15 MED ORDER — NITROGLYCERIN 0.4 MG SL SUBL: 0.4000 mg | SUBLINGUAL_TABLET | SUBLINGUAL | Status: DC | PRN

## 2016-04-15 MED ORDER — NITROGLYCERIN 0.4 MG SL SUBL: 0.4000 mg | SUBLINGUAL_TABLET | SUBLINGUAL | 2 refills | Status: DC | PRN

## 2016-04-15 MED ORDER — ATORVASTATIN CALCIUM 40 MG PO TABS: 40.0000 mg | ORAL_TABLET | Freq: Every day | ORAL | 3 refills | Status: DC

## 2016-04-15 MED ORDER — SERTRALINE HCL 50 MG PO TABS: 50.0000 mg | ORAL_TABLET | Freq: Every day | ORAL | 0 refills | Status: DC

## 2016-04-15 MED ORDER — PANTOPRAZOLE SODIUM 40 MG PO TBEC: 40.0000 mg | DELAYED_RELEASE_TABLET | Freq: Every day | ORAL | 3 refills | Status: DC

## 2016-04-15 MED ORDER — BENAZEPRIL HCL 10 MG PO TABS: 10.0000 mg | ORAL_TABLET | Freq: Every day | ORAL | 3 refills | Status: DC

## 2016-04-15 MED ORDER — TRAZODONE HCL 150 MG PO TABS: 150.0000 mg | ORAL_TABLET | Freq: Every day | ORAL | 0 refills | Status: DC

## 2016-04-15 MED ORDER — METOPROLOL TARTRATE 50 MG PO TABS: 50.0000 mg | ORAL_TABLET | Freq: Two times a day (BID) | ORAL | 3 refills | Status: DC

## 2016-04-15 MED ORDER — DIVALPROEX SODIUM ER 500 MG PO TB24: 500.0000 mg | ORAL_TABLET | Freq: Two times a day (BID) | ORAL | 0 refills | Status: DC

## 2016-04-15 NOTE — Progress Notes (Signed)
Patient Name: Shawna Anderson Date of Encounter: 04/15/2016  Primary Cardiologist: Dr. Antonieta Loveless Problem List     Active Problems:   Chest pain     Subjective   Cath reassuring. No further CP  Inpatient Medications    Scheduled Meds: . aspirin EC  81 mg Oral Daily  . atorvastatin  40 mg Oral q1800  . clopidogrel  75 mg Oral Daily  . famotidine  40 mg Oral Daily  . fluticasone furoate-vilanterol  1 puff Inhalation Daily  . lisinopril  10 mg Oral Daily  . metoprolol tartrate  50 mg Oral BID  . pantoprazole  40 mg Oral Daily  . regadenoson  0.4 mg Intravenous Once  . sertraline  50 mg Oral Daily  . sodium chloride flush  3 mL Intravenous Q12H  . traZODone  150 mg Oral QHS   Continuous Infusions: . nitroGLYCERIN 10 mcg/min (04/14/16 1400)   PRN Meds:.sodium chloride, acetaminophen, albuterol, alum & mag hydroxide-simeth, ondansetron (ZOFRAN) IV, sodium chloride flush   Vital Signs    Vitals:   04/14/16 2000 04/14/16 2125 04/15/16 0335 04/15/16 0733  BP: (!) 114/53 128/63 103/81 (!) 163/89  Pulse: (!) 55 65 60 63  Resp: 20  14 13   Temp:   98.3 F (36.8 C) 98.2 F (36.8 C)  TempSrc:   Oral Oral  SpO2: 96%  97% 96%  Weight:   187 lb 6.3 oz (85 kg)   Height:        Intake/Output Summary (Last 24 hours) at 04/15/16 0851 Last data filed at 04/15/16 0733  Gross per 24 hour  Intake          1537.07 ml  Output              200 ml  Net          1337.07 ml   Filed Weights   04/12/16 1116 04/14/16 0527 04/15/16 0335  Weight: 186 lb 8.2 oz (84.6 kg) 185 lb 4.8 oz (84.1 kg) 187 lb 6.3 oz (85 kg)    Physical Exam    GEN: Well nourished, well developed, in no acute distress.  HEENT: Grossly normal.  Neck: Supple, no JVD, carotid bruits, or masses. Cardiac: RRR, no murmurs, rubs, or gallops. No clubbing, cyanosis, edema.  Radials/DP/PT 2+ and equal bilaterally. Cath site normal. Respiratory:  Respirations regular and unlabored, clear to auscultation  bilaterally. GI: Soft, nontender, nondistended, BS + x 4. MS: no deformity or atrophy. Skin: warm and dry, no rash. Neuro:  Strength and sensation are intact. Psych: AAOx3.  Normal affect.  Labs    CBC  Recent Labs  04/14/16 0223 04/15/16 0526  WBC 12.8* 10.6*  HGB 12.3 12.3  HCT 38.4 38.9  MCV 90.1 90.0  PLT 387 [id number]   Basic Metabolic Panel  Recent Labs  04/14/16 0223 04/15/16 0526  NA 136 136  K 4.0 4.7  CL 103 105  CO2 25 24  GLUCOSE 108* 133*  BUN 12 14  CREATININE 0.82 0.88  CALCIUM 8.9 8.8*   BNP  Telemetry    Sinus brady, no adverse rhythms - Personally Reviewed  ECG    SB NSSTW changes- Personally Reviewed  Radiology    No results found.   Cardiac Studies   Cath 04/14/16  Mid RCA lesion, 30 %stenosed.  Ost RPDA to RPDA lesion, 20 %stenosed.  Mid RCA to Dist RCA lesion, 10 %stenosed.  Ost Cx to Prox Cx lesion, 50 %stenosed.  Prox  LAD to Mid LAD lesion, 20 %stenosed.  The left ventricular ejection fraction is greater than 65% by visual estimate.  The left ventricular systolic function is normal.  LV end diastolic pressure is normal.  There is no mitral valve regurgitation.   1. Single vessel CAD  2. Patent stent in the ostium of the Circumflex with moderate restenosis. This does not appear to be flow limiting. 3. Mild non-obstructive disease in the RCA and LAD.  4. Normal LV function  Recommendation: Continue medical management of CAD.   Patient Profile     50 year old with:  Reassuring cath Continue med mgt.   Encourage compliance   Type 2 diabetes mellitus.  GERD, now on PPI.  Bipolar disorder.  Assessment & Plan     - Continue ASA Plavix, low dose metoprolol (limited by bradycardia)  - Added atorvastatin 40. (She states she was supposed to be taking but not)  OK for DC.   Signed, Candee Furbish, MD  04/15/2016, 8:51 AM

## 2016-04-15 NOTE — Discharge Summary (Signed)
Patient ID: Shawna Anderson,  MRN: [id number], DOB/AGE: 1965-09-25 51 y.o.  Admit date: 04/12/2016 Discharge date: 04/15/2016  Primary Care Provider: Lavera Guise, MD Primary Cardiologist: [facility] office  Discharge Diagnoses Principal Problem:   Chest pain with moderate risk of acute coronary syndrome Active Problems:   CAD S/P CFX PCI 2010   Essential hypertension   Dyslipidemia   Bipolar 1 disorder ([place])   Noncompliance with medication regimen    Procedures: Lt heart cath 04/14/16   Hospital Course: 50 y/o female with a history of prior CFX PCI with BMS in [place] in 2010, HTN, HLD, and Bipolar disorder, admitted 04/12/16 with chest pain suspicious for unstable angina.  She had been out of all her medications for some time. She was placed on Heparin and NTG. Her Troponin was negative but she continued to have chest pain and it was decide to proceed with diagnostic cath. This was done Monday 04/14/16 and revealed a 50% CFX and no other obstructive CAD. LVF was normal. Her medications were resumed and she was provided 90 prescriptions for her cardiac medications and a 30 day only prescription for her psychiatric medications. She has been instructed to contact mental health clinic in [place] for follow up. We will arrange for Christell Faith to see her in the [place] office in 1-2 weeks.   Discharge Vitals:  Blood pressure (!) 163/89, pulse 63, temperature 98.2 F (36.8 C), temperature source Oral, resp. rate 13, height 5\' 3"  (1.6 m), weight 187 lb 6.3 oz (85 kg), SpO2 96 %.    Labs: Results for orders placed or performed during the hospital encounter of 04/12/16 (from the past 24 hour(s))  CBC     Status: Abnormal   Collection Time: 04/15/16  5:26 AM  Result Value Ref Range   WBC 10.6 (H) 4.0 - 10.5 K/uL   RBC 4.32 3.87 - 5.11 MIL/uL   Hemoglobin 12.3 12.0 - 15.0 g/dL   HCT 38.9 36.0 - 46.0 %   MCV 90.0 78.0 - 100.0 fL   MCH 28.5 26.0 - 34.0 pg   MCHC 31.6 30.0 - 36.0  g/dL   RDW 14.6 11.5 - 15.5 %   Platelets 380 150 - 400 K/uL  Basic metabolic panel     Status: Abnormal   Collection Time: 04/15/16  5:26 AM  Result Value Ref Range   Sodium 136 135 - 145 mmol/L   Potassium 4.7 3.5 - 5.1 mmol/L   Chloride 105 101 - 111 mmol/L   CO2 24 22 - 32 mmol/L   Glucose, Bld 133 (H) 65 - 99 mg/dL   BUN 14 6 - 20 mg/dL   Creatinine, Ser 0.88 0.44 - 1.00 mg/dL   Calcium 8.8 (L) 8.9 - 10.3 mg/dL   GFR calc non Af Amer >60 >60 mL/min   GFR calc Af Amer >60 >60 mL/min   Anion gap 7 5 - 15    Disposition:  Follow-up Information    Mertie Moores, MD .   Specialty:  Cardiology Why:  office will contact you Contact information: [address] [place] [address] [phone]           Discharge Medications:    Medication List    STOP taking these medications   famotidine 40 MG tablet Commonly known as:  PEPCID   HYDROcodone-acetaminophen 5-325 MG tablet Commonly known as:  NORCO   lisinopril 10 MG tablet Commonly known as:  PRINIVIL,ZESTRIL Replaced by:  benazepril  10 MG tablet   promethazine 25 MG suppository Commonly known as:  PHENERGAN   traMADol 50 MG tablet Commonly known as:  ULTRAM     TAKE these medications   acetaminophen 325 MG tablet Commonly known as:  TYLENOL Take 2 tablets (650 mg total) by mouth every 4 (four) hours as needed for headache or mild pain.   albuterol 108 (90 Base) MCG/ACT inhaler Commonly known as:  PROVENTIL HFA;VENTOLIN HFA Inhale 2 puffs into the lungs every 4 (four) hours as needed for wheezing or shortness of breath.   aspirin 81 MG tablet Take 81 mg by mouth daily.   atorvastatin 40 MG tablet Commonly known as:  LIPITOR Take 1 tablet (40 mg total) by mouth daily at 6 PM.   benazepril 10 MG tablet Commonly known as:  LOTENSIN Take 1 tablet (10 mg total) by mouth daily. Replaces:  lisinopril 10 MG tablet   BREO ELLIPTA IN Inhale 1 puff into the lungs 2 (two) times daily.     clopidogrel 75 MG tablet Commonly known as:  PLAVIX Take 1 tablet (75 mg total) by mouth daily.   divalproex 500 MG 24 hr tablet Commonly known as:  DEPAKOTE ER Take 1 tablet (500 mg total) by mouth 2 (two) times daily. 1 tablet AM, NOON, PM and 2 at bedtime What changed:  how much to take  when to take this   metoprolol 50 MG tablet Commonly known as:  LOPRESSOR Take 1 tablet (50 mg total) by mouth 2 (two) times daily. What changed:  medication strength  how much to take   nitroGLYCERIN 0.4 MG SL tablet Commonly known as:  NITROSTAT Place 1 tablet (0.4 mg total) under the tongue every 5 (five) minutes as needed for chest pain.   pantoprazole 40 MG tablet Commonly known as:  PROTONIX Take 1 tablet (40 mg total) by mouth daily.   sertraline 50 MG tablet Commonly known as:  ZOLOFT Take 1 tablet (50 mg total) by mouth daily.   traZODone 150 MG tablet Commonly known as:  DESYREL Take 1 tablet (150 mg total) by mouth at bedtime.        Duration of Discharge Encounter: Greater than 30 minutes including physician time.  Signed, Kerin Ransom PA-C 04/15/2016 10:00 AM   Personally seen and examined. Agree with above. Patient Name: Shawna Anderson Date of Encounter: 04/15/2016  Primary Cardiologist: Dr. Antonieta Loveless Problem List     Active Problems:   Chest pain     Subjective   Cath reassuring. No further CP  Inpatient Medications    Scheduled Meds: . aspirin EC  81 mg Oral Daily  . atorvastatin  40 mg Oral q1800  . clopidogrel  75 mg Oral Daily  . famotidine  40 mg Oral Daily  . fluticasone furoate-vilanterol  1 puff Inhalation Daily  . lisinopril  10 mg Oral Daily  . metoprolol tartrate  50 mg Oral BID  . pantoprazole  40 mg Oral Daily  . regadenoson  0.4 mg Intravenous Once  . sertraline  50 mg Oral Daily  . sodium chloride flush  3 mL Intravenous Q12H  . traZODone  150 mg Oral QHS   Continuous Infusions: . nitroGLYCERIN 10  mcg/min (04/14/16 1400)   PRN Meds:.sodium chloride, acetaminophen, albuterol, alum & mag hydroxide-simeth, ondansetron (ZOFRAN) IV, sodium chloride flush   Vital Signs          Vitals:   04/14/16 2000 04/14/16 2125 04/15/16 0335 04/15/16 [id number]  BP: (!) 114/53 128/63 103/81 (!) 163/89  Pulse: (!) 55 65 60 63  Resp: 20  14 13   Temp:   98.3 F (36.8 C) 98.2 F (36.8 C)  TempSrc:   Oral Oral  SpO2: 96%  97% 96%  Weight:   187 lb 6.3 oz (85 kg)   Height:        Intake/Output Summary (Last 24 hours) at 04/15/16 0851 Last data filed at 04/15/16 0733  Gross per 24 hour  Intake          1537.07 ml  Output              200 ml  Net          1337.07 ml        Filed Weights   04/12/16 1116 04/14/16 0527 04/15/16 0335  Weight: 186 lb 8.2 oz (84.6 kg) 185 lb 4.8 oz (84.1 kg) 187 lb 6.3 oz (85 kg)    Physical Exam    GEN: Well nourished, well developed, in no acute distress.  HEENT: Grossly normal.  Neck: Supple, no JVD, carotid bruits, or masses. Cardiac: RRR, no murmurs, rubs, or gallops. No clubbing, cyanosis, edema.  Radials/DP/PT 2+ and equal bilaterally. Cath site normal. Respiratory:  Respirations regular and unlabored, clear to auscultation bilaterally. GI: Soft, nontender, nondistended, BS + x 4. MS: no deformity or atrophy. Skin: warm and dry, no rash. Neuro:  Strength and sensation are intact. Psych: AAOx3.  Normal affect.  Labs    CBC  Recent Labs (last 2 labs)    Recent Labs  04/14/16 0223 04/15/16 0526  WBC 12.8* 10.6*  HGB 12.3 12.3  HCT 38.4 38.9  MCV 90.1 90.0  PLT 387 380     Basic Metabolic Panel  Recent Labs (last 2 labs)    Recent Labs  04/14/16 0223 04/15/16 0526  NA 136 136  K 4.0 4.7  CL 103 105  CO2 25 24  GLUCOSE 108* 133*  BUN 12 14  CREATININE 0.82 0.88  CALCIUM 8.9 8.8*     BNP  Telemetry    Sinus brady, no adverse rhythms - Personally Reviewed  ECG    SB NSSTW changes- Personally  Reviewed  Radiology    Imaging Results (Last 48 hours)  No results found.     Cardiac Studies   Cath 04/14/16  Mid RCA lesion, 30 %stenosed.  Ost RPDA to RPDA lesion, 20 %stenosed.  Mid RCA to Dist RCA lesion, 10 %stenosed.  Ost Cx to Prox Cx lesion, 50 %stenosed.  Prox LAD to Mid LAD lesion, 20 %stenosed.  The left ventricular ejection fraction is greater than 65% by visual estimate.  The left ventricular systolic function is normal.  LV end diastolic pressure is normal.  There is no mitral valve regurgitation.  1. Single vessel CAD  2. Patent stent in the ostium of the Circumflex with moderate restenosis. This does not appear to be flow limiting. 3. Mild non-obstructive disease in the RCA and LAD.  4. Normal LV function  Recommendation: Continue medical management of CAD.   Patient Profile     50 year old with:  Reassuring cath Continue med mgt.   Encourage compliance   Type 2 diabetes mellitus.  GERD, now on PPI.  Bipolar disorder.  Assessment & Plan     - Continue ASA Plavix, low dose metoprolol (limited by bradycardia)  - Added atorvastatin 40. (She states she was supposed to be taking but not)  OK for DC.  Signed, Candee Furbish, MD

## 2016-04-15 NOTE — Discharge Instructions (Signed)
Chest Wall Pain °Chest wall pain is pain in or around the bones and muscles of your chest. Sometimes, an injury causes this pain. Sometimes, the cause may not be known. This pain may take several weeks or longer to get better. °HOME CARE °Pay attention to any changes in your symptoms. Take these actions to help with your pain: °· Rest as told by your doctor. °· Avoid activities that cause pain. Try not to use your chest, belly (abdominal), or side muscles to lift heavy things. °· If directed, apply ice to the painful area: °¨ Put ice in a plastic bag. °¨ Place a towel between your skin and the bag. °¨ Leave the ice on for 20 minutes, 2-3 times per day. °· Take over-the-counter and prescription medicines only as told by your doctor. °· Do not use tobacco products, including cigarettes, chewing tobacco, and e-cigarettes. If you need help quitting, ask your doctor. °· Keep all follow-up visits as told by your doctor. This is important. °GET HELP IF: °· You have a fever. °· Your chest pain gets worse. °· You have new symptoms. °GET HELP RIGHT AWAY IF: °· You feel sick to your stomach (nauseous) or you throw up (vomit). °· You feel sweaty or light-headed. °· You have a cough with phlegm (sputum) or you cough up blood. °· You are short of breath. °  °This information is not intended to replace advice given to you by your health care provider. Make sure you discuss any questions you have with your health care provider. °  °Document Released: 12/17/2007 Document Revised: 03/21/2015 Document Reviewed: 09/25/2014 °Elsevier Interactive Patient Education ©2016 [employer] ° °

## 2016-05-21 DIAGNOSIS — I10 Essential (primary) hypertension: Secondary | ICD-10-CM | POA: Diagnosis not present

## 2016-05-21 DIAGNOSIS — F17210 Nicotine dependence, cigarettes, uncomplicated: Secondary | ICD-10-CM | POA: Diagnosis not present

## 2016-05-21 DIAGNOSIS — R079 Chest pain, unspecified: Secondary | ICD-10-CM | POA: Diagnosis not present

## 2016-05-21 DIAGNOSIS — F3162 Bipolar disorder, current episode mixed, moderate: Secondary | ICD-10-CM | POA: Diagnosis not present

## 2016-06-03 ENCOUNTER — Other Ambulatory Visit: Payer: Self-pay | Admitting: Internal Medicine

## 2016-06-03 DIAGNOSIS — R079 Chest pain, unspecified: Secondary | ICD-10-CM

## 2016-06-10 ENCOUNTER — Encounter: Admission: RE | Admit: 2016-06-10 | Payer: Commercial Managed Care - HMO | Source: Ambulatory Visit

## 2016-09-16 ENCOUNTER — Emergency Department: Payer: Medicare HMO

## 2016-09-16 ENCOUNTER — Emergency Department
Admission: EM | Admit: 2016-09-16 | Discharge: 2016-09-16 | Disposition: A | Discharge status: Home / Self Care | Payer: Medicare HMO | Attending: Emergency Medicine | Admitting: Emergency Medicine

## 2016-09-16 ENCOUNTER — Encounter: Payer: Self-pay | Admitting: Emergency Medicine

## 2016-09-16 DIAGNOSIS — J45909 Unspecified asthma, uncomplicated: Secondary | ICD-10-CM | POA: Insufficient documentation

## 2016-09-16 DIAGNOSIS — F17210 Nicotine dependence, cigarettes, uncomplicated: Secondary | ICD-10-CM | POA: Insufficient documentation

## 2016-09-16 DIAGNOSIS — I10 Essential (primary) hypertension: Secondary | ICD-10-CM | POA: Diagnosis not present

## 2016-09-16 DIAGNOSIS — E119 Type 2 diabetes mellitus without complications: Secondary | ICD-10-CM | POA: Insufficient documentation

## 2016-09-16 DIAGNOSIS — M62838 Other muscle spasm: Secondary | ICD-10-CM | POA: Insufficient documentation

## 2016-09-16 DIAGNOSIS — I2510 Atherosclerotic heart disease of native coronary artery without angina pectoris: Secondary | ICD-10-CM | POA: Insufficient documentation

## 2016-09-16 DIAGNOSIS — M25511 Pain in right shoulder: Secondary | ICD-10-CM | POA: Diagnosis not present

## 2016-09-16 MED ORDER — DICLOFENAC SODIUM 3 % TD GEL: 1.0000 "application " | Freq: Two times a day (BID) | TRANSDERMAL | 0 refills | Status: DC | PRN

## 2016-09-16 MED ORDER — IBUPROFEN 600 MG PO TABS
600.0000 mg | ORAL_TABLET | Freq: Once | ORAL | Status: AC
  Administered 2016-09-16: 600 mg via ORAL
  Filled 2016-09-16: qty 1

## 2016-09-16 MED ORDER — CARISOPRODOL 350 MG PO TABS: 350.0000 mg | ORAL_TABLET | Freq: Three times a day (TID) | ORAL | 0 refills | Status: DC | PRN

## 2016-09-16 MED ORDER — DIAZEPAM 5 MG PO TABS
5.0000 mg | ORAL_TABLET | Freq: Once | ORAL | Status: AC
  Administered 2016-09-16: 5 mg via ORAL
  Filled 2016-09-16: qty 1

## 2016-09-16 NOTE — ED Provider Notes (Signed)
[facility] Emergency Department Provider Note  ____________________________________________   First MD Initiated Contact with Patient 09/16/16 [phone]     (approximate)  I have reviewed the triage vital signs and the nursing notes.   HISTORY  Chief Complaint Shoulder Pain   HPI SHERIA SKELLEY is a 51 y.o. female with a history of bipolar disorder as well as diabetes who is presenting to the emergency department today with right shoulder pain. She says that 2 days ago she was reaching for something on the ground and felt the pain started when she was performed this action. Says that ever since that the pain has been slowly increasing and is now a severe, cramping pain to the right shoulder, anterior right chest as well as the right thoracic back. She says it hurts to move her arm. Does not report any shortness of breath, nausea vomiting or diaphoresis.   Past Medical History:  Diagnosis Date  . Asthma   . Bipolar 1 disorder ([place])   . Cervical herniated disc 04/12/2016  . Depression   . Diabetes mellitus without complication ([place])   . GERD (gastroesophageal reflux disease)   . Hypertension   . Sleep apnea     Patient Active Problem List   Diagnosis Date Noted  . CAD S/P CFX PCI 2010 04/15/2016  . Essential hypertension 04/15/2016  . Dyslipidemia 04/15/2016  . Bipolar 1 disorder ([place]) 04/15/2016  . Noncompliance with medication regimen 04/15/2016  . Chest pain with moderate risk of acute coronary syndrome 04/12/2016  . Abdominal pain 01/12/2015  . Vomiting 01/12/2015  . Nausea and vomiting 01/12/2015    Past Surgical History:  Procedure Laterality Date  . ABDOMINAL HYSTERECTOMY    . ABDOMINAL SURGERY  1995   Bowel resection.  Marland Kitchen CARDIAC CATHETERIZATION N/A 04/14/2016   Procedure: Left Heart Cath and Coronary Angiography;  Surgeon: Burnell Blanks, MD;  Location: [place] [facility];  Service: Cardiovascular;  Laterality: N/A;  .  CARDIAC SURGERY    . CORONARY ANGIOPLASTY WITH STENT PLACEMENT  2010   Drug eluting stent    Prior to Admission medications   Medication Sig Start Date End Date Taking? Authorizing Provider  acetaminophen (TYLENOL) 325 MG tablet Take 2 tablets (650 mg total) by mouth every 4 (four) hours as needed for headache or mild pain. 04/15/16   Erlene Quan, PA-C  albuterol (PROVENTIL HFA;VENTOLIN HFA) 108 (90 BASE) MCG/ACT inhaler Inhale 2 puffs into the lungs every 4 (four) hours as needed for wheezing or shortness of breath.    Historical Provider, MD  aspirin 81 MG tablet Take 81 mg by mouth daily.    Historical Provider, MD  atorvastatin (LIPITOR) 40 MG tablet Take 1 tablet (40 mg total) by mouth daily at 6 PM. 04/15/16   Erlene Quan, PA-C  benazepril (LOTENSIN) 10 MG tablet Take 1 tablet (10 mg total) by mouth daily. 04/15/16   Erlene Quan, PA-C  clopidogrel (PLAVIX) 75 MG tablet Take 1 tablet (75 mg total) by mouth daily. 04/15/16   Erlene Quan, PA-C  divalproex (DEPAKOTE ER) 500 MG 24 hr tablet Take 1 tablet (500 mg total) by mouth 2 (two) times daily. 1 tablet AM, NOON, PM and 2 at bedtime 04/15/16   Doreene Burke [place], PA-C  Fluticasone Furoate-Vilanterol (BREO ELLIPTA IN) Inhale 1 puff into the lungs 2 (two) times daily.    Historical Provider, MD  metoprolol tartrate (LOPRESSOR) 50 MG tablet Take 1 tablet (50 mg total)  by mouth 2 (two) times daily. 04/15/16   Erlene Quan, PA-C  nitroGLYCERIN (NITROSTAT) 0.4 MG SL tablet Place 1 tablet (0.4 mg total) under the tongue every 5 (five) minutes as needed for chest pain. 04/15/16   Erlene Quan, PA-C  pantoprazole (PROTONIX) 40 MG tablet Take 1 tablet (40 mg total) by mouth daily. 04/15/16   Erlene Quan, PA-C  sertraline (ZOLOFT) 50 MG tablet Take 1 tablet (50 mg total) by mouth daily. 04/15/16   Erlene Quan, PA-C  traZODone (DESYREL) 150 MG tablet Take 1 tablet (150 mg total) by mouth at bedtime. 04/15/16   Erlene Quan, PA-C    Allergies Drixoral  allergy sinus [dexbromphen-pse-apap er]  Family History  Problem Relation Age of Onset  . CAD Mother   . CAD Brother     Social History Social History  Substance Use Topics  . Smoking status: Current Every Day Smoker    Packs/day: 0.50    Years: 30.00    Types: Cigarettes  . Smokeless tobacco: Never Used     Comment: refused  . Alcohol use Yes     Comment: occasional    Review of Systems Constitutional: No fever/chills Eyes: No visual changes. ENT: No sore throat. Cardiovascular: as above Respiratory: Denies shortness of breath. Gastrointestinal: No abdominal pain.  No nausea, no vomiting.  No diarrhea.  No constipation. Genitourinary: Negative for dysuria. Musculoskeletal: Negative for back pain. Skin: Negative for rash. Neurological: Negative for headaches, focal weakness or numbness.  10-point ROS otherwise negative.  ____________________________________________   PHYSICAL EXAM:  VITAL SIGNS: ED Triage Vitals [09/16/16 0330]  Enc Vitals Group     BP (!) 170/92     Pulse Rate 73     Resp 20     Temp 98.2 F (36.8 C)     Temp Source Oral     SpO2 97 %     Weight 192 lb (87.1 kg)     Height 5\' 3"  (1.6 m)     Head Circumference      Peak Flow      Pain Score 10     Pain Loc      Pain Edu?      Excl. in [place]?     Constitutional: Alert and oriented. Well appearing and in no acute distress. Eyes: Conjunctivae are normal. PERRL. EOMI. Head: Atraumatic. Nose: No congestion/rhinnorhea. Mouth/Throat: Mucous membranes are moist.  Neck: No stridor.   Cardiovascular: Normal rate, regular rhythm. Grossly normal heart sounds.   Respiratory: Normal respiratory effort.  No retractions. Lungs CTAB. Gastrointestinal: Soft and nontender. No distention. No abdominal bruits. No CVA tenderness. Musculoskeletal: No lower extremity tenderness nor edema.  No joint effusions.  Right upper and lateral chest tenderness just anterior to the shoulder. Also tenderness to  palpation over the musculature overlying the scapula. No swelling or deformity noted. Neurovascularly intact distally. Pain with passive and active range of motion to the right shoulder. No erythema or effusion.  Neurologic:  Normal speech and language. No gross focal neurologic deficits are appreciated.  Skin:  Skin is warm, dry and intact. No rash noted. Psychiatric: Mood and affect are normal. Speech and behavior are normal.  ____________________________________________   LABS (all labs ordered are listed, but only abnormal results are displayed)  Labs Reviewed - No data to display ____________________________________________  EKG   ____________________________________________  RADIOLOGY    DG Shoulder Right (Final result)  Result time 09/16/16 03:58:48  Final result by Delphina Cahill,  MD (09/16/16 03:58:48)           Narrative:   CLINICAL DATA: Nontraumatic right shoulder pain for over 24 hours.  EXAM: RIGHT SHOULDER - 2+ VIEW  COMPARISON: None.  FINDINGS: There is no evidence of fracture or dislocation. There is no evidence of arthropathy or other focal bone abnormality. Soft tissues are unremarkable.  IMPRESSION: Negative.   Electronically Signed By: Andreas Newport M.D. On: 09/16/2016 03:58            ____________________________________________   PROCEDURES  Procedure(s) performed:   Procedures  Critical Care performed:   ____________________________________________   INITIAL IMPRESSION / ASSESSMENT AND PLAN / ED COURSE  Pertinent labs & imaging results that were available during my care of the patient were reviewed by me and considered in my medical decision making (see chart for details).  ----------------------------------------- 6:12 AM on 09/16/2016 -----------------------------------------  Patient asleep at this time but easily arousable. Says that she is still having the pain but it has improved. Improved range  of motion now with adduction to the horizontal but with pain. Pain is spasm has been building for 3 days. May be taking a prolonged period of time to resolve. Patient with improvement and I believe appropriate for discharge at this time. I discussed with her getting her home on a muscle relaxer as well as diclofenac gel for pain. She is understanding of this plan and willing to comply. Likely muscle spasm causing her issue.      ____________________________________________   FINAL CLINICAL IMPRESSION(S) / ED DIAGNOSES  muscle spasm.    NEW MEDICATIONS STARTED DURING THIS VISIT:  New Prescriptions   No medications on file     Note:  This document was prepared using Dragon voice recognition software and may include unintentional dictation errors.    Orbie Pyo, MD 09/16/16 [phone]

## 2016-09-16 NOTE — ED Triage Notes (Addendum)
Pt presents to ED with right shoulder pain. Pt states it has been hurting for over 24 hours and is radiating down her arm. Pt tearful in triage. Pain described as throbbing and worsens with movement and palpation. Pt states she had taken a nap and had noticed the pain but it has worsened throughout the day.

## 2016-09-17 ENCOUNTER — Encounter: Payer: Self-pay | Admitting: Emergency Medicine

## 2016-09-17 ENCOUNTER — Encounter (HOSPITAL_COMMUNITY): Payer: Self-pay | Admitting: Emergency Medicine

## 2016-09-17 ENCOUNTER — Emergency Department: Payer: Medicare HMO

## 2016-09-17 ENCOUNTER — Emergency Department
Admission: EM | Admit: 2016-09-17 | Discharge: 2016-09-17 | Disposition: A | Discharge status: Home / Self Care | Payer: Medicare HMO | Attending: Emergency Medicine | Admitting: Emergency Medicine

## 2016-09-17 DIAGNOSIS — J45909 Unspecified asthma, uncomplicated: Secondary | ICD-10-CM | POA: Diagnosis not present

## 2016-09-17 DIAGNOSIS — M7551 Bursitis of right shoulder: Secondary | ICD-10-CM | POA: Diagnosis not present

## 2016-09-17 DIAGNOSIS — I10 Essential (primary) hypertension: Secondary | ICD-10-CM | POA: Diagnosis not present

## 2016-09-17 DIAGNOSIS — F17210 Nicotine dependence, cigarettes, uncomplicated: Secondary | ICD-10-CM | POA: Insufficient documentation

## 2016-09-17 DIAGNOSIS — I2510 Atherosclerotic heart disease of native coronary artery without angina pectoris: Secondary | ICD-10-CM | POA: Insufficient documentation

## 2016-09-17 DIAGNOSIS — E119 Type 2 diabetes mellitus without complications: Secondary | ICD-10-CM | POA: Diagnosis not present

## 2016-09-17 DIAGNOSIS — M25511 Pain in right shoulder: Secondary | ICD-10-CM | POA: Diagnosis not present

## 2016-09-17 DIAGNOSIS — G8929 Other chronic pain: Secondary | ICD-10-CM | POA: Diagnosis not present

## 2016-09-17 DIAGNOSIS — Z7982 Long term (current) use of aspirin: Secondary | ICD-10-CM | POA: Diagnosis not present

## 2016-09-17 MED ORDER — OXYCODONE-ACETAMINOPHEN 5-325 MG PO TABS
1.0000 | ORAL_TABLET | Freq: Once | ORAL | Status: AC
  Administered 2016-09-17: 1 via ORAL
  Filled 2016-09-17: qty 1

## 2016-09-17 MED ORDER — KETOROLAC TROMETHAMINE 60 MG/2ML IM SOLN
60.0000 mg | Freq: Once | INTRAMUSCULAR | Status: AC
  Administered 2016-09-17: 60 mg via INTRAMUSCULAR
  Filled 2016-09-17: qty 2

## 2016-09-17 MED ORDER — TRAMADOL HCL 50 MG PO TABS: 50.0000 mg | ORAL_TABLET | Freq: Once | ORAL | Status: DC

## 2016-09-17 MED ORDER — LIDOCAINE 5 % EX PTCH
1.0000 | MEDICATED_PATCH | CUTANEOUS | Status: DC
  Administered 2016-09-17: 1 via TRANSDERMAL
  Filled 2016-09-17: qty 1

## 2016-09-17 MED ORDER — OXYCODONE-ACETAMINOPHEN 5-325 MG PO TABS: 1.0000 | ORAL_TABLET | Freq: Four times a day (QID) | ORAL | 0 refills | Status: DC | PRN

## 2016-09-17 NOTE — ED Provider Notes (Signed)
[facility] Emergency Department Provider Note   First MD Initiated Contact with Patient 09/17/16 [phone]     (approximate)  I have reviewed the triage vital signs and the nursing notes.   HISTORY  Chief Complaint Shoulder Pain   HPI Shawna Anderson is a 51 y.o. female returns to the emergency department with worsening right shoulder pain. Patient was seen in the ER yesterday same complaint with negative x-ray of the right shoulder performed. Patient states that she awoke from sleep 2 days ago with current pain. Patient states her pain is 10 out of 10 worse with movement denies any alleviating factors. Patient states that her pain is unrelieved with muscle relaxant and she was prescribed at her previous visit to the emergency department. Patient denies any neck pain.   Past Medical History:  Diagnosis Date  . Asthma   . Bipolar 1 disorder ([place])   . Cervical herniated disc 04/12/2016  . Depression   . Diabetes mellitus without complication ([place])   . GERD (gastroesophageal reflux disease)   . Hypertension   . Sleep apnea     Patient Active Problem List   Diagnosis Date Noted  . CAD S/P CFX PCI 2010 04/15/2016  . Essential hypertension 04/15/2016  . Dyslipidemia 04/15/2016  . Bipolar 1 disorder ([place]) 04/15/2016  . Noncompliance with medication regimen 04/15/2016  . Chest pain with moderate risk of acute coronary syndrome 04/12/2016  . Abdominal pain 01/12/2015  . Vomiting 01/12/2015  . Nausea and vomiting 01/12/2015    Past Surgical History:  Procedure Laterality Date  . ABDOMINAL HYSTERECTOMY    . ABDOMINAL SURGERY  1995   Bowel resection.  Marland Kitchen CARDIAC CATHETERIZATION N/A 04/14/2016   Procedure: Left Heart Cath and Coronary Angiography;  Surgeon: Burnell Blanks, MD;  Location: [facility];  Service: Cardiovascular;  Laterality: N/A;  . CARDIAC SURGERY    . CORONARY ANGIOPLASTY WITH STENT PLACEMENT  2010   Drug eluting stent     Prior to Admission medications   Medication Sig Start Date End Date Taking? Authorizing Provider  acetaminophen (TYLENOL) 325 MG tablet Take 2 tablets (650 mg total) by mouth every 4 (four) hours as needed for headache or mild pain. 04/15/16   Erlene Quan, PA-C  albuterol (PROVENTIL HFA;VENTOLIN HFA) 108 (90 BASE) MCG/ACT inhaler Inhale 2 puffs into the lungs every 4 (four) hours as needed for wheezing or shortness of breath.    Historical Provider, MD  aspirin 81 MG tablet Take 81 mg by mouth daily.    Historical Provider, MD  atorvastatin (LIPITOR) 40 MG tablet Take 1 tablet (40 mg total) by mouth daily at 6 PM. 04/15/16   Erlene Quan, PA-C  benazepril (LOTENSIN) 10 MG tablet Take 1 tablet (10 mg total) by mouth daily. 04/15/16   Erlene Quan, PA-C  carisoprodol (SOMA) 350 MG tablet Take 1 tablet (350 mg total) by mouth 3 (three) times daily as needed for muscle spasms. 09/16/16 09/16/17  Orbie Pyo, MD  clopidogrel (PLAVIX) 75 MG tablet Take 1 tablet (75 mg total) by mouth daily. 04/15/16   Erlene Quan, PA-C  Diclofenac Sodium 3 % GEL Place 1 application onto the skin every 12 (twelve) hours as needed. 09/16/16   Orbie Pyo, MD  divalproex (DEPAKOTE ER) 500 MG 24 hr tablet Take 1 tablet (500 mg total) by mouth 2 (two) times daily. 1 tablet AM, NOON, PM and 2 at bedtime 04/15/16   Lurena Joiner  K Kilroy, PA-C  Fluticasone Furoate-Vilanterol (BREO ELLIPTA IN) Inhale 1 puff into the lungs 2 (two) times daily.    Historical Provider, MD  metoprolol tartrate (LOPRESSOR) 50 MG tablet Take 1 tablet (50 mg total) by mouth 2 (two) times daily. 04/15/16   Erlene Quan, PA-C  nitroGLYCERIN (NITROSTAT) 0.4 MG SL tablet Place 1 tablet (0.4 mg total) under the tongue every 5 (five) minutes as needed for chest pain. 04/15/16   Erlene Quan, PA-C  pantoprazole (PROTONIX) 40 MG tablet Take 1 tablet (40 mg total) by mouth daily. 04/15/16   Erlene Quan, PA-C  sertraline (ZOLOFT) 50 MG tablet Take  1 tablet (50 mg total) by mouth daily. 04/15/16   Erlene Quan, PA-C  traZODone (DESYREL) 150 MG tablet Take 1 tablet (150 mg total) by mouth at bedtime. 04/15/16   Erlene Quan, PA-C    Allergies Drixoral allergy sinus [dexbromphen-pse-apap er]  Family History  Problem Relation Age of Onset  . CAD Mother   . CAD Brother     Social History Social History  Substance Use Topics  . Smoking status: Current Every Day Smoker    Packs/day: 0.50    Years: 30.00    Types: Cigarettes  . Smokeless tobacco: Never Used     Comment: refused  . Alcohol use Yes     Comment: occasional    Review of Systems Constitutional: No fever/chills Eyes: No visual changes. ENT: No sore throat. Cardiovascular: Denies chest pain. Respiratory: Denies shortness of breath. Gastrointestinal: No abdominal pain.  No nausea, no vomiting.  No diarrhea.  No constipation. Genitourinary: Negative for dysuria. Musculoskeletal: Negative for back pain. Skin: Negative for rash. Neurological: Negative for headaches, focal weakness or numbness.  10-point ROS otherwise negative.  ____________________________________________   PHYSICAL EXAM:  VITAL SIGNS: ED Triage Vitals  Enc Vitals Group     BP 09/17/16 0518 (!) 203/100     Pulse Rate 09/17/16 0518 98     Resp 09/17/16 0518 (!) 22     Temp 09/17/16 0518 97.9 F (36.6 C)     Temp Source 09/17/16 0518 Oral     SpO2 09/17/16 0518 100 %     Weight 09/17/16 0519 190 lb (86.2 kg)     Height 09/17/16 0519 5\' 3"  (1.6 m)     Head Circumference --      Peak Flow --      Pain Score 09/17/16 0517 10     Pain Loc --      Pain Edu? --      Excl. in [place]? --     Constitutional: Alert and oriented. Well appearing and in no acute distress. Eyes: Conjunctivae are normal. PERRL. EOMI. Head: Atraumatic. Ears:  Healthy appearing ear canals and TMs bilaterally Nose: No congestion/rhinnorhea. Mouth/Throat: Mucous membranes are moist.  Oropharynx  non-erythematous. Neck: No stridor.  No meningeal signs.  No cervical spine tenderness to palpation. Cardiovascular: Normal rate, regular rhythm. Good peripheral circulation. Grossly normal heart sounds. Respiratory: Normal respiratory effort.  No retractions. Lungs CTAB. Gastrointestinal: Soft and nontender. No distention.  Musculoskeletal:Pain with anterior shoulder and scapula palpation. pain with active and passive ROM right shoulder Neurologic:  Normal speech and language. No gross focal neurologic deficits are appreciated.  Skin:  Skin is warm, dry and intact. No rash noted. Psychiatric: Mood and affect are normal. Speech and behavior are normal.    RADIOLOGY I, [place], personally viewed and evaluated these images (plain radiographs) as  part of my medical decision making, as well as reviewing the written report by the radiologist.  No results found.  Procedures     INITIAL IMPRESSION / ASSESSMENT AND PLAN / ED COURSE  Pertinent labs & imaging results that were available during my care of the patient were reviewed by me and considered in my medical decision making (see chart for details).  51 year old female returning to the emergency department with continued right shoulder pain after visit to the emergency department yesterday. Initial x-ray performed revealed no gross abnormality. Patient with reproducible pain on palpation and active and passive range of motion. As such MRI of the shoulder performed to evaluate patient for rotator cuff injury versus bursitis. In addition patient noted to be hypertensive. I spoke with the patient regarding her hypertension to which she replied that she has medications at home which she does not take them. Spoke with the patient regarding the dangers in doing so to which she responded "fine I'll start taking them" Patient's care transferred to Dr. Corky Downs      ____________________________________________  FINAL CLINICAL IMPRESSION(S)  / ED DIAGNOSES  Final diagnoses:  Right shoulder pain     MEDICATIONS GIVEN DURING THIS VISIT:  Medications  lidocaine (LIDODERM) 5 % 1 patch (1 patch Transdermal Patch Applied 09/17/16 0541)  ketorolac (TORADOL) injection 60 mg (60 mg Intramuscular Given 09/17/16 0538)  oxyCODONE-acetaminophen (PERCOCET/ROXICET) 5-325 MG per tablet 1 tablet (1 tablet Oral Given 09/17/16 0540)     NEW OUTPATIENT MEDICATIONS STARTED DURING THIS VISIT:  New Prescriptions   No medications on file    Modified Medications   No medications on file    Discontinued Medications   No medications on file     Note:  This document was prepared using Dragon voice recognition software and may include unintentional dictation errors.    Gregor Hams, MD 09/18/16 [phone]

## 2016-09-17 NOTE — ED Notes (Signed)
Pt's discharge papers and rx placed at front desk with first nurse.

## 2016-09-17 NOTE — ED Notes (Signed)
MD to bedside, cursing at MD and charge nurse, making threats against MD's personal self and family. Pt asked to leave by [employer], pt ambulatory to ED exit with no difficulty or distress, cursing and spitting as escorted by police.

## 2016-09-17 NOTE — ED Notes (Signed)
Pt refusing tramadol. Pt states "I don't give a damn what the narcotic laws are, tell the doctor to give me morphine or dilaudid. I know what works for my body". Pt cursing in room, MD aware of conversation, no new orders at this time.

## 2016-09-17 NOTE — ED Triage Notes (Addendum)
Patient ambulatory to triage with steady gait, without difficulty, pt tearful; seen here yesterday for right shoulder pain; rx muscle relaxers without relief; st pain with ROM

## 2016-09-17 NOTE — ED Provider Notes (Signed)
Pending MRI shoulder results. Patient knows that we are still waiting on radiology read. Patient demanded Dilaudid or morphine and became increasingly belligerent with the nurse. Ordered tramadol 50 kg by mouth and she refused. Explained to her that I do not feel morphine and Dilaudid are appropriate given the chronicity of this pain. She became extremely agitated, foaming at the mouth, screaming curse words at me and calling me an "ugly ass bitch ". At this point I asked security to come into the room and escort her out of the emergency department given that she has been screened for a life-threatening condition.   Shawna Drafts, MD 09/17/16 [phone]

## 2016-09-17 NOTE — ED Triage Notes (Signed)
Pt to ED from home c/o R shoulder pain x 2 days, denies injury. Pt states she can't lift it up above her chest level without severe pain radiating to her shoulderblade. Pt states she was seen at [facility] ED, they did an x-ray and she "cursed them out and got kicked out and never got any of her results." Pt states she can't sleep d/t the pain. CMS intact bilaterally.

## 2016-09-18 ENCOUNTER — Emergency Department (HOSPITAL_COMMUNITY)
Admission: EM | Admit: 2016-09-18 | Discharge: 2016-09-18 | Disposition: A | Discharge status: Home / Self Care | Payer: Medicare HMO | Attending: Emergency Medicine | Admitting: Emergency Medicine

## 2016-09-18 DIAGNOSIS — M7551 Bursitis of right shoulder: Secondary | ICD-10-CM

## 2016-09-18 DIAGNOSIS — I10 Essential (primary) hypertension: Secondary | ICD-10-CM

## 2016-09-18 MED ORDER — METOPROLOL TARTRATE 25 MG PO TABS
50.0000 mg | ORAL_TABLET | Freq: Once | ORAL | Status: AC
  Administered 2016-09-18: 50 mg via ORAL
  Filled 2016-09-18: qty 2

## 2016-09-18 MED ORDER — HYDROMORPHONE HCL 2 MG/ML IJ SOLN
1.0000 mg | Freq: Once | INTRAMUSCULAR | Status: AC
  Administered 2016-09-18: 1 mg via INTRAMUSCULAR
  Filled 2016-09-18: qty 1

## 2016-09-18 MED ORDER — PREDNISONE 10 MG (21) PO TBPK: ORAL_TABLET | ORAL | 0 refills | Status: DC

## 2016-09-18 MED ORDER — OXYCODONE-ACETAMINOPHEN 5-325 MG PO TABS
2.0000 | ORAL_TABLET | Freq: Once | ORAL | Status: AC
  Administered 2016-09-18: 2 via ORAL
  Filled 2016-09-18: qty 2

## 2016-09-18 MED ORDER — IBUPROFEN 800 MG PO TABS: 800.0000 mg | ORAL_TABLET | Freq: Three times a day (TID) | ORAL | 0 refills | Status: DC | PRN

## 2016-09-18 MED ORDER — OXYCODONE-ACETAMINOPHEN 5-325 MG PO TABS: 1.0000 | ORAL_TABLET | Freq: Four times a day (QID) | ORAL | 0 refills | Status: DC | PRN

## 2016-09-18 MED ORDER — IBUPROFEN 800 MG PO TABS
800.0000 mg | ORAL_TABLET | Freq: Once | ORAL | Status: AC
  Administered 2016-09-18: 800 mg via ORAL
  Filled 2016-09-18: qty 1

## 2016-09-18 MED ORDER — PREDNISONE 20 MG PO TABS
60.0000 mg | ORAL_TABLET | Freq: Once | ORAL | Status: AC
  Administered 2016-09-18: 60 mg via ORAL
  Filled 2016-09-18: qty 3

## 2016-09-18 MED ORDER — BENAZEPRIL HCL 10 MG PO TABS
10.0000 mg | ORAL_TABLET | Freq: Once | ORAL | Status: AC
  Administered 2016-09-18: 10 mg via ORAL
  Filled 2016-09-18: qty 1

## 2016-09-18 NOTE — ED Notes (Signed)
Pt sobbing with pain, no change with PO meds. Arm sling applied. MD notified and additional orders received and given

## 2016-09-18 NOTE — ED Provider Notes (Addendum)
TIME SEEN: 4:18 AM  CHIEF COMPLAINT: Right shoulder pain  HPI: Patient is a right-hand-dominant female with history of hypertension, borderline diabetes not on medications, bipolar disorder who presents the emergency department 3 days of right shoulder pain. Pain worse at night and described as throbbing, aching. No history of injury. No radiation of pain. Pain worse with movement and better with keeping the arm still. No numbness, tingling or focal weakness. No neck pain. Was seen at [facility] and had an MRI. States that she suggested Dilaudid and morphine for pain control as these medications have helped her before. States that she became upset with the physician at the outside emergency department and "cursed him out". She states she realizes this was not appropriate but that "the pain took over".  ROS: See HPI Constitutional: no fever  Eyes: no drainage  ENT: no runny nose   Cardiovascular:  no chest pain  Resp: no SOB  GI: no vomiting GU: no dysuria Integumentary: no rash  Allergy: no hives  Musculoskeletal: no leg swelling  Neurological: no slurred speech ROS otherwise negative  PAST MEDICAL HISTORY/PAST SURGICAL HISTORY:  Past Medical History:  Diagnosis Date  . Asthma   . Bipolar 1 disorder ([place])   . Cervical herniated disc 04/12/2016  . Depression   . Diabetes mellitus without complication ([place])   . GERD (gastroesophageal reflux disease)   . Hypertension   . Sleep apnea     MEDICATIONS:  Prior to Admission medications   Medication Sig Start Date End Date Taking? Authorizing Provider  acetaminophen (TYLENOL) 325 MG tablet Take 2 tablets (650 mg total) by mouth every 4 (four) hours as needed for headache or mild pain. 04/15/16   Erlene Quan, PA-C  albuterol (PROVENTIL HFA;VENTOLIN HFA) 108 (90 BASE) MCG/ACT inhaler Inhale 2 puffs into the lungs every 4 (four) hours as needed for wheezing or shortness of breath.    Historical Provider, MD  aspirin 81 MG tablet  Take 81 mg by mouth daily.    Historical Provider, MD  atorvastatin (LIPITOR) 40 MG tablet Take 1 tablet (40 mg total) by mouth daily at 6 PM. 04/15/16   Erlene Quan, PA-C  benazepril (LOTENSIN) 10 MG tablet Take 1 tablet (10 mg total) by mouth daily. 04/15/16   Erlene Quan, PA-C  carisoprodol (SOMA) 350 MG tablet Take 1 tablet (350 mg total) by mouth 3 (three) times daily as needed for muscle spasms. 09/16/16 09/16/17  Orbie Pyo, MD  clopidogrel (PLAVIX) 75 MG tablet Take 1 tablet (75 mg total) by mouth daily. 04/15/16   Erlene Quan, PA-C  Diclofenac Sodium 3 % GEL Place 1 application onto the skin every 12 (twelve) hours as needed. 09/16/16   Orbie Pyo, MD  divalproex (DEPAKOTE ER) 500 MG 24 hr tablet Take 1 tablet (500 mg total) by mouth 2 (two) times daily. 1 tablet AM, NOON, PM and 2 at bedtime 04/15/16   Doreene Burke Kilroy, PA-C  Fluticasone Furoate-Vilanterol (BREO ELLIPTA IN) Inhale 1 puff into the lungs 2 (two) times daily.    Historical Provider, MD  metoprolol tartrate (LOPRESSOR) 50 MG tablet Take 1 tablet (50 mg total) by mouth 2 (two) times daily. 04/15/16   Erlene Quan, PA-C  nitroGLYCERIN (NITROSTAT) 0.4 MG SL tablet Place 1 tablet (0.4 mg total) under the tongue every 5 (five) minutes as needed for chest pain. 04/15/16   Erlene Quan, PA-C  oxyCODONE-acetaminophen (ROXICET) 5-325 MG tablet Take 1 tablet by  mouth every 6 (six) hours as needed. 09/17/16 09/17/17  Gregor Hams, MD  pantoprazole (PROTONIX) 40 MG tablet Take 1 tablet (40 mg total) by mouth daily. 04/15/16   Erlene Quan, PA-C  sertraline (ZOLOFT) 50 MG tablet Take 1 tablet (50 mg total) by mouth daily. 04/15/16   Erlene Quan, PA-C  traZODone (DESYREL) 150 MG tablet Take 1 tablet (150 mg total) by mouth at bedtime. 04/15/16   Erlene Quan, PA-C    ALLERGIES:  Allergies  Allergen Reactions  . Drixoral Allergy Sinus [Dexbromphen-Pse-Apap Er] Rash    SOCIAL HISTORY:  Social History  Substance Use  Topics  . Smoking status: Current Every Day Smoker    Packs/day: 0.50    Years: 30.00    Types: Cigarettes  . Smokeless tobacco: Never Used     Comment: refused  . Alcohol use Yes     Comment: occasional    FAMILY HISTORY: Family History  Problem Relation Age of Onset  . CAD Mother   . CAD Brother     EXAM: BP 170/90 (BP Location: Left Arm)   Pulse 69   Temp 98 F (36.7 C) (Oral)   Resp 18   SpO2 97%  CONSTITUTIONAL: Alert and oriented and responds appropriately to questions. Well-appearing; well-nourished HEAD: Normocephalic EYES: Conjunctivae clear, pupils appear equal, EOMI ENT: normal nose; moist mucous membranes NECK: Supple, no meningismus, no nuchal rigidity, no LAD  CARD: RRR; S1 and S2 appreciated; no murmurs, no clicks, no rubs, no gallops RESP: Normal chest excursion without splinting or tachypnea; breath sounds clear and equal bilaterally; no wheezes, no rhonchi, no rales, no hypoxia or respiratory distress, speaking full sentences ABD/GI: Normal bowel sounds; non-distended; soft, non-tender, no rebound, no guarding, no peritoneal signs, no hepatosplenomegaly BACK:  The back appears normal and is non-tender to palpation, there is no CVA tenderness EXT: Tender over the right anterior and superior portion of the shoulder without significant swelling or loss of fullness. Normal grip strength in the right arm. Unable to lift her arm above her head without significant pain. Does have full range of motion however in this joint. 2+ radial pulses bilaterally. Normal sensation throughout the right arm. No bony deformity. Normal ROM in all joints; otherwise extremities are non-tender to palpation; no edema; normal capillary refill; no cyanosis, no calf tenderness or swelling    SKIN: Normal color for age and race; warm; no rash NEURO: Moves all extremities equally PSYCH: The patient's mood and manner are appropriate. Grooming and personal hygiene are appropriate.  MEDICAL  DECISION MAKING: Patient here with acute right shoulder pain. MRI done at outside hospital shows prominent subacromial/subdeltoid bursitis and an intact rotator cuff. No signs of shoulder dislocation, fracture. No sign of septic arthritis or gout. Discussed with patient that we will give her Percocet as this was what we will discharge her with as well as ibuprofen and a steroid taper. Have her committed outpatient follow-up with orthopedics. I do not feel she needs any further emergent workup. Patient is calm, kind and appropriate here. She is very apologetic for her previous behavior.   At this time, I do not feel there is any life-threatening condition present. I have reviewed and discussed all results (EKG, imaging, lab, urine as appropriate) and exam findings with patient/family. I have reviewed nursing notes and appropriate previous records.  I feel the patient is safe to be discharged home without further emergent workup and can continue workup as an outpatient as needed. Discussed  usual and customary return precautions. Patient/family verbalize understanding and are comfortable with this plan.  Outpatient follow-up has been provided if needed. All questions have been answered.      [place], DO 09/18/16 0443    5:50 AM  Pt now extremely hypertensive and sobbing in pain. Reports her right shoulder pain is not completely managed with Percocet. I feel that we should give her a dose of IM Dilaudid for pain control which will likely help with her blood pressure as well. Anticipate discharge once pain is better controlled blood pressure has improved.   7:00 AM  Pt's pain is improved but her blood pressure is still markedly elevated. She is not having headache, vision changes, chest pain or shortness of breath, numbness, tingling or focal weakness. I will give her her home dose of metoprolol and benazepril. She states she has these medications at home but does not take them regularly. States is  normal for her blood pressure to become significant family elevated with pain. I feel once her blood pressure has improved she can be discharged. Have recommended close follow-up with her PCP for this.    [place], DO 09/18/16 [phone]

## 2016-09-27 DIAGNOSIS — K5792 Diverticulitis of intestine, part unspecified, without perforation or abscess without bleeding: Secondary | ICD-10-CM | POA: Diagnosis not present

## 2016-09-27 DIAGNOSIS — R109 Unspecified abdominal pain: Secondary | ICD-10-CM | POA: Diagnosis not present

## 2017-02-12 ENCOUNTER — Encounter (HOSPITAL_COMMUNITY): Payer: Self-pay | Admitting: Emergency Medicine

## 2017-02-12 ENCOUNTER — Emergency Department (HOSPITAL_COMMUNITY)
Admission: EM | Admit: 2017-02-12 | Discharge: 2017-02-12 | Disposition: A | Discharge status: Home / Self Care | Payer: Medicare Other | Attending: Emergency Medicine | Admitting: Emergency Medicine

## 2017-02-12 DIAGNOSIS — I10 Essential (primary) hypertension: Secondary | ICD-10-CM | POA: Insufficient documentation

## 2017-02-12 DIAGNOSIS — Z955 Presence of coronary angioplasty implant and graft: Secondary | ICD-10-CM | POA: Insufficient documentation

## 2017-02-12 DIAGNOSIS — F17210 Nicotine dependence, cigarettes, uncomplicated: Secondary | ICD-10-CM | POA: Insufficient documentation

## 2017-02-12 DIAGNOSIS — J45909 Unspecified asthma, uncomplicated: Secondary | ICD-10-CM | POA: Insufficient documentation

## 2017-02-12 DIAGNOSIS — F319 Bipolar disorder, unspecified: Secondary | ICD-10-CM | POA: Insufficient documentation

## 2017-02-12 DIAGNOSIS — R1110 Vomiting, unspecified: Secondary | ICD-10-CM | POA: Insufficient documentation

## 2017-02-12 DIAGNOSIS — Z91040 Latex allergy status: Secondary | ICD-10-CM | POA: Insufficient documentation

## 2017-02-12 DIAGNOSIS — Z7982 Long term (current) use of aspirin: Secondary | ICD-10-CM | POA: Insufficient documentation

## 2017-02-12 DIAGNOSIS — E119 Type 2 diabetes mellitus without complications: Secondary | ICD-10-CM | POA: Insufficient documentation

## 2017-02-12 DIAGNOSIS — Z79899 Other long term (current) drug therapy: Secondary | ICD-10-CM | POA: Insufficient documentation

## 2017-02-12 LAB — COMPREHENSIVE METABOLIC PANEL
ALT: 25 U/L (ref 14–54)
AST: 24 U/L (ref 15–41)
Albumin: 3.9 g/dL (ref 3.5–5.0)
Alkaline Phosphatase: 99 U/L (ref 38–126)
Anion gap: 13 (ref 5–15)
BUN: 11 mg/dL (ref 6–20)
CO2: 24 mmol/L (ref 22–32)
Calcium: 8.7 mg/dL — ABNORMAL LOW (ref 8.9–10.3)
Chloride: 104 mmol/L (ref 101–111)
Creatinine, Ser: 0.79 mg/dL (ref 0.44–1.00)
GFR calc Af Amer: >60 mL/min (ref >60)
GFR calc non Af Amer: >60 mL/min (ref >60)
Glucose, Bld: 128 mg/dL — ABNORMAL HIGH (ref 65–99)
Potassium: 3.4 mmol/L — ABNORMAL LOW (ref 3.5–5.1)
Sodium: 141 mmol/L (ref 135–145)
Total Bilirubin: 0.4 mg/dL (ref 0.3–1.2)
Total Protein: 7 g/dL (ref 6.5–8.1)

## 2017-02-12 LAB — CBC
HCT: 43.9 % (ref 36.0–46.0)
Hemoglobin: 14.4 g/dL (ref 12.0–15.0)
MCH: 27.9 pg (ref 26.0–34.0)
MCHC: 32.8 g/dL (ref 30.0–36.0)
MCV: 84.9 fL (ref 78.0–100.0)
Platelets: 415 10*3/uL — ABNORMAL HIGH (ref 150–400)
RBC: 5.17 MIL/uL — ABNORMAL HIGH (ref 3.87–5.11)
RDW: 13.8 % (ref 11.5–15.5)
WBC: 19.4 10*3/uL — ABNORMAL HIGH (ref 4.0–10.5)

## 2017-02-12 LAB — LIPASE, BLOOD: Lipase: 26 U/L (ref 11–51)

## 2017-02-12 MED ORDER — ONDANSETRON HCL 4 MG/2ML IJ SOLN
4.0000 mg | Freq: Once | INTRAMUSCULAR | Status: AC
  Administered 2017-02-12: 4 mg via INTRAVENOUS
  Filled 2017-02-12: qty 2

## 2017-02-12 MED ORDER — SODIUM CHLORIDE 0.9 % IV BOLUS (SEPSIS)
1000.0000 mL | Freq: Once | INTRAVENOUS | Status: AC
  Administered 2017-02-12: 1000 mL via INTRAVENOUS

## 2017-02-12 MED ORDER — DICYCLOMINE HCL 10 MG/ML IM SOLN
20.0000 mg | Freq: Once | INTRAMUSCULAR | Status: AC
  Administered 2017-02-12: 20 mg via INTRAMUSCULAR
  Filled 2017-02-12: qty 2

## 2017-02-12 MED ORDER — ONDANSETRON 4 MG PO TBDP: 4.0000 mg | ORAL_TABLET | Freq: Once | ORAL | Status: DC | PRN

## 2017-02-12 MED ORDER — CAPSAICIN 0.025 % EX CREA
TOPICAL_CREAM | Freq: Once | CUTANEOUS | Status: AC
  Administered 2017-02-12: 19:00:00 via TOPICAL
  Filled 2017-02-12: qty 60

## 2017-02-12 MED ORDER — ONDANSETRON 4 MG PO TBDP
ORAL_TABLET | ORAL | Status: AC
  Administered 2017-02-12: 4 mg
  Filled 2017-02-12: qty 1

## 2017-02-12 MED ORDER — HALOPERIDOL LACTATE 5 MG/ML IJ SOLN
5.0000 mg | Freq: Once | INTRAMUSCULAR | Status: AC
  Administered 2017-02-12: 5 mg via INTRAVENOUS
  Filled 2017-02-12: qty 1

## 2017-02-12 MED ORDER — PROMETHAZINE HCL 25 MG RE SUPP: 25.0000 mg | Freq: Four times a day (QID) | RECTAL | 0 refills | Status: DC | PRN

## 2017-02-12 NOTE — Discharge Instructions (Signed)
Please follow with your primary care doctor in the next 2 days for a check-up. They must obtain records for further management.  ° °Do not hesitate to return to the Emergency Department for any new, worsening or concerning symptoms.  ° °

## 2017-02-12 NOTE — ED Triage Notes (Signed)
Pt states "I get this a lot.I start vomiting and cannot stop." Pt states she has been having abd pain as well. Pt vomiting in triage. Pt states  "I guess it's called gastritis"

## 2017-02-12 NOTE — ED Notes (Signed)
Pt requesting a cup of ice. Given as PO challenge.

## 2017-02-12 NOTE — ED Provider Notes (Signed)
Medical screening examination/treatment/procedure(s) were conducted as a shared visit with non-physician practitioner(s) and myself.  I personally evaluated the patient during the encounter.   EKG Interpretation None     Patient having some problems with recurrent episodes of intense vomiting. She does smoke marijuana nearly daily. Patient is alert and nontoxic. On arrival she has harsh dry heaves. She however is alert and appropriate. She does not have respiratory distress. I have rechecked the patient after Haldol and capsaicin cream. She is now resting comfortably. She reports she feels much improved and feels ready to go home.   Charlesetta Shanks, MD 02/12/17 1949

## 2017-02-12 NOTE — ED Notes (Signed)
ED Provider at bedside. 

## 2017-02-12 NOTE — ED Notes (Signed)
Pt continues to cough and move during, while taking BP. BP taken multiple times. Pt stated that she has "high BP".

## 2017-02-12 NOTE — ED Provider Notes (Signed)
[facility] DEPT Provider Note   CSN: [id number] Arrival date & time: 02/12/17  1545     History   Chief Complaint Chief Complaint  Patient presents with  . Emesis  . Abdominal Pain    HPI   Blood pressure (!) 224/111, pulse 100, temperature (!) 97.5 F (36.4 C), temperature source Oral, resp. rate 20, height 5\' 3"  (1.6 m), weight 86.2 kg (190 lb), SpO2 100 %.  Shawna Anderson is a 51 y.o. female with past medical history significant for bipolar, diabetes (patient states that she's borderline diabetes), hypertension, she is complaining of multiple episodes of nonbloody, nonbilious, non-coffee ground emesis onset yesterday. She said this happens intermittently several times a year. She states that she did have a GI doctor but recently moved to the area and hasn't established local care. She does state that she smokes marijuana approximately one blunt per day to help with her bipolar disorder. She is adamant that that is not what is causing her emesis today. She denies any fever, change in bowel or bladder habits she states that she has severe upper abdominal cramping just prior to emesis.   Past Medical History:  Diagnosis Date  . Asthma   . Bipolar 1 disorder ([place])   . Cervical herniated disc 04/12/2016  . Depression   . Diabetes mellitus without complication ([place])   . GERD (gastroesophageal reflux disease)   . Hypertension   . Sleep apnea     Patient Active Problem List   Diagnosis Date Noted  . CAD S/P CFX PCI 2010 04/15/2016  . Essential hypertension 04/15/2016  . Dyslipidemia 04/15/2016  . Bipolar 1 disorder ([place]) 04/15/2016  . Noncompliance with medication regimen 04/15/2016  . Chest pain with moderate risk of acute coronary syndrome 04/12/2016  . Abdominal pain 01/12/2015  . Vomiting 01/12/2015  . Nausea and vomiting 01/12/2015    Past Surgical History:  Procedure Laterality Date  . ABDOMINAL HYSTERECTOMY    . ABDOMINAL SURGERY  1995   Bowel resection.    Marland Kitchen CARDIAC CATHETERIZATION N/A 04/14/2016   Procedure: Left Heart Cath and Coronary Angiography;  Surgeon: Burnell Blanks, MD;  Location: [facility];  Service: Cardiovascular;  Laterality: N/A;  . CARDIAC SURGERY    . CORONARY ANGIOPLASTY WITH STENT PLACEMENT  2010   Drug eluting stent    OB History    No data available       Home Medications    Prior to Admission medications   Medication Sig Start Date End Date Taking? Authorizing Provider  aspirin EC 81 MG tablet Take 81 mg by mouth daily.   Yes [provider]  traZODone (DESYREL) 150 MG tablet Take 150 mg by mouth at bedtime.   Yes [provider]  ibuprofen (ADVIL,MOTRIN) 800 MG tablet Take 1 tablet (800 mg total) by mouth every 8 (eight) hours as needed for mild pain. Patient not taking: Reported on 02/12/2017 09/18/16   Ward, Delice Bison, DO  oxyCODONE-acetaminophen (PERCOCET/ROXICET) 5-325 MG tablet Take 1-2 tablets by mouth every 6 (six) hours as needed. Patient not taking: Reported on 02/12/2017 09/18/16   Ward, Delice Bison, DO  predniSONE (STERAPRED UNI-PAK 21 TAB) 10 MG (21) TBPK tablet Take as directed Patient not taking: Reported on 02/12/2017 09/18/16   Ward, Delice Bison, DO  promethazine (PHENERGAN) 25 MG suppository Place 1 suppository (25 mg total) rectally every 6 (six) hours as needed for nausea or vomiting. 02/12/17   Wreatha Sturgeon, Charna Elizabeth    Family History  Family History  Problem Relation Age of Onset  . CAD Mother   . CAD Brother     Social History Social History  Substance Use Topics  . Smoking status: Current Every Day Smoker    Packs/day: 0.50    Years: 30.00    Types: Cigarettes  . Smokeless tobacco: Never Used     Comment: refused  . Alcohol use Yes     Comment: occasional     Allergies   Latex; Tape; and Drixoral allergy sinus [dexbromphen-pse-apap er]   Review of Systems Review of Systems  A complete review of systems was obtained and all systems are negative except  as noted in the HPI and PMH.    Physical Exam Updated Vital Signs BP (!) 109/91   Pulse (!) 115   Temp (!) 97.5 F (36.4 C) (Oral)   Resp 14   Ht 5\' 3"  (1.6 m)   Wt 86.2 kg (190 lb)   SpO2 97%   BMI 33.66 kg/m   Physical Exam  Constitutional: She is oriented to person, place, and time. She appears well-developed and well-nourished. No distress.  HENT:  Head: Normocephalic and atraumatic.  Mouth/Throat: Oropharynx is clear and moist.  Eyes: Pupils are equal, round, and reactive to light. Conjunctivae and EOM are normal.  Neck: Normal range of motion.  Cardiovascular: Normal rate, regular rhythm and intact distal pulses.  Exam reveals no friction rub.   No murmur heard. Pulmonary/Chest: Effort normal and breath sounds normal. No respiratory distress. She has no wheezes. She has no rales. She exhibits no tenderness.  Abdominal: Soft. She exhibits no distension and no mass. There is tenderness. There is no rebound and no guarding. No hernia.  Vomiting extensively. Mild tenderness palpation in the bilateral upper quadrants with no focal tenderness, guarding or rebound.  Musculoskeletal: Normal range of motion.  Neurological: She is alert and oriented to person, place, and time.  Skin: Capillary refill takes less than 2 seconds. She is not diaphoretic.  Psychiatric: She has a normal mood and affect.  Nursing note and vitals reviewed.    ED Treatments / Results  Labs (all labs ordered are listed, but only abnormal results are displayed) Labs Reviewed  CBC - Abnormal; Notable for the following:       Result Value   WBC 19.4 (*)    RBC 5.17 (*)    Platelets 415 (*)    All other components within normal limits  COMPREHENSIVE METABOLIC PANEL - Abnormal; Notable for the following:    Potassium 3.4 (*)    Glucose, Bld 128 (*)    Calcium 8.7 (*)    All other components within normal limits  LIPASE, BLOOD  URINALYSIS, ROUTINE W REFLEX MICROSCOPIC  RAPID URINE DRUG SCREEN, HOSP  PERFORMED    EKG  EKG Interpretation None       Radiology No results found.  Procedures Procedures (including critical care time)  Medications Ordered in ED Medications  ondansetron (ZOFRAN-ODT) disintegrating tablet 4 mg (not administered)  ondansetron (ZOFRAN-ODT) 4 MG disintegrating tablet (4 mg  Given 02/12/17 1556)  sodium chloride 0.9 % bolus 1,000 mL (0 mLs Intravenous Stopped 02/12/17 1835)  ondansetron (ZOFRAN) injection 4 mg (4 mg Intravenous Given 02/12/17 1706)  dicyclomine (BENTYL) injection 20 mg (20 mg Intramuscular Given 02/12/17 1716)  haloperidol lactate (HALDOL) injection 5 mg (5 mg Intravenous Given 02/12/17 1758)  capsaicin (ZOSTRIX) 0.025 % cream ( Topical Given 02/12/17 1835)     Initial Impression / Assessment and  Plan / ED Course  I have reviewed the triage vital signs and the nursing notes.  Pertinent labs & imaging results that were available during my care of the patient were reviewed by me and considered in my medical decision making (see chart for details).     Vitals:   02/12/17 1928 02/12/17 1930 02/12/17 1945 02/12/17 2000  BP: (!) 144/74 (!) 149/65 (!) 106/54 (!) 109/91  Pulse: (!) 111 (!) 114 (!) 109 (!) 115  Resp:  14    Temp:      TempSrc:      SpO2: 100% 100% 97% 97%  Weight:      Height:        Medications  ondansetron (ZOFRAN-ODT) disintegrating tablet 4 mg (not administered)  ondansetron (ZOFRAN-ODT) 4 MG disintegrating tablet (4 mg  Given 02/12/17 1556)  sodium chloride 0.9 % bolus 1,000 mL (0 mLs Intravenous Stopped 02/12/17 1835)  ondansetron (ZOFRAN) injection 4 mg (4 mg Intravenous Given 02/12/17 1706)  dicyclomine (BENTYL) injection 20 mg (20 mg Intramuscular Given 02/12/17 1716)  haloperidol lactate (HALDOL) injection 5 mg (5 mg Intravenous Given 02/12/17 1758)  capsaicin (ZOSTRIX) 0.025 % cream ( Topical Given 02/12/17 1835)    Shawna Anderson is 51 y.o. female presenting with Multiple episodes of emesis onset yesterday with upper  abdominal cramping. Her blood pressure is elevated today, after vomiting is under control blood pressure has normalized, she is much calmer at this point as well. Repeat abdominal exam remains benign. She states that she has an appointment with her primary care doctor, she states that she smokes marijuana because she is unable to afford her bipolar medications. I have offered her referral or referral to [facility] but she says that there is a 25 dark co-pay which she cannot pay. She is invited to return to the ED at any time.  Evaluation does not show pathology that would require ongoing emergent intervention or inpatient treatment. Pt is hemodynamically stable and mentating appropriately. Discussed findings and plan with patient/guardian, who agrees with care plan. All questions answered. Return precautions discussed and outpatient follow up given.      Final Clinical Impressions(s) / ED Diagnoses   Final diagnoses:  Non-intractable vomiting, presence of nausea not specified, unspecified vomiting type    New Prescriptions New Prescriptions   PROMETHAZINE (PHENERGAN) 25 MG SUPPOSITORY    Place 1 suppository (25 mg total) rectally every 6 (six) hours as needed for nausea or vomiting.     Waynetta Pean 02/12/17 2038    Charlesetta Shanks, MD 02/12/17 [phone]

## 2017-02-12 NOTE — ED Notes (Signed)
Re-drew blood work & sent to lab. When asked about urine, pt states she "just went to the bathroom" and that "no one told her we needed urine". States she's feeling better and ready to go home now.

## 2017-02-12 NOTE — ED Notes (Signed)
Reviewed d/c instructions with pt, as well as additional educational material on hyperemesis r/t cannabis use. Pt accepted the material, but stated "I've been smoking since I was a teenager. I'm gonna keep smoking because they ain't giving me my mental health meds."   Pt departed in NAD.

## 2017-03-12 ENCOUNTER — Encounter: Payer: Self-pay | Admitting: Emergency Medicine

## 2017-03-12 ENCOUNTER — Emergency Department: Payer: Medicare Other

## 2017-03-12 ENCOUNTER — Emergency Department
Admission: EM | Admit: 2017-03-12 | Discharge: 2017-03-12 | Disposition: A | Discharge status: Home / Self Care | Payer: Medicare Other | Attending: Emergency Medicine | Admitting: Emergency Medicine

## 2017-03-12 DIAGNOSIS — E119 Type 2 diabetes mellitus without complications: Secondary | ICD-10-CM | POA: Insufficient documentation

## 2017-03-12 DIAGNOSIS — Z0471 Encounter for examination and observation following alleged adult physical abuse: Secondary | ICD-10-CM | POA: Insufficient documentation

## 2017-03-12 DIAGNOSIS — I10 Essential (primary) hypertension: Secondary | ICD-10-CM | POA: Diagnosis not present

## 2017-03-12 DIAGNOSIS — Z955 Presence of coronary angioplasty implant and graft: Secondary | ICD-10-CM | POA: Diagnosis not present

## 2017-03-12 DIAGNOSIS — Z79899 Other long term (current) drug therapy: Secondary | ICD-10-CM | POA: Diagnosis not present

## 2017-03-12 DIAGNOSIS — Z7982 Long term (current) use of aspirin: Secondary | ICD-10-CM | POA: Diagnosis not present

## 2017-03-12 DIAGNOSIS — J45909 Unspecified asthma, uncomplicated: Secondary | ICD-10-CM | POA: Diagnosis not present

## 2017-03-12 DIAGNOSIS — R51 Headache: Secondary | ICD-10-CM | POA: Diagnosis not present

## 2017-03-12 DIAGNOSIS — F0781 Postconcussional syndrome: Secondary | ICD-10-CM

## 2017-03-12 DIAGNOSIS — R519 Headache, unspecified: Secondary | ICD-10-CM

## 2017-03-12 DIAGNOSIS — Z9119 Patient's noncompliance with other medical treatment and regimen: Secondary | ICD-10-CM

## 2017-03-12 DIAGNOSIS — Z91199 Patient's noncompliance with other medical treatment and regimen due to unspecified reason: Secondary | ICD-10-CM

## 2017-03-12 DIAGNOSIS — O161 Unspecified maternal hypertension, first trimester: Secondary | ICD-10-CM

## 2017-03-12 LAB — CBC
HCT: 43.5 % (ref 35.0–47.0)
Hemoglobin: 14.5 g/dL (ref 12.0–16.0)
MCH: 27.8 pg (ref 26.0–34.0)
MCHC: 33.3 g/dL (ref 32.0–36.0)
MCV: 83.7 fL (ref 80.0–100.0)
Platelets: 392 10*3/uL (ref 150–440)
RBC: 5.2 MIL/uL (ref 3.80–5.20)
RDW: 14.2 % (ref 11.5–14.5)
WBC: 11.2 10*3/uL — ABNORMAL HIGH (ref 3.6–11.0)

## 2017-03-12 LAB — BASIC METABOLIC PANEL
Anion gap: 8 (ref 5–15)
BUN: 10 mg/dL (ref 6–20)
CO2: 25 mmol/L (ref 22–32)
Calcium: 9.3 mg/dL (ref 8.9–10.3)
Chloride: 104 mmol/L (ref 101–111)
Creatinine, Ser: 0.8 mg/dL (ref 0.44–1.00)
GFR calc Af Amer: >60 mL/min (ref >60)
GFR calc non Af Amer: >60 mL/min (ref >60)
Glucose, Bld: 113 mg/dL — ABNORMAL HIGH (ref 65–99)
Potassium: 3.8 mmol/L (ref 3.5–5.1)
Sodium: 137 mmol/L (ref 135–145)

## 2017-03-12 LAB — URINALYSIS, COMPLETE (UACMP) WITH MICROSCOPIC
Bacteria, UA: NONE SEEN
Bilirubin Urine: NEGATIVE
Glucose, UA: NEGATIVE mg/dL
Ketones, ur: NEGATIVE mg/dL
Leukocytes, UA: NEGATIVE
Nitrite: NEGATIVE
Protein, ur: NEGATIVE mg/dL
Specific Gravity, Urine: 1.008 (ref 1.005–1.030)
pH: 5 (ref 5.0–8.0)

## 2017-03-12 LAB — TROPONIN I
Troponin I: 0.04 ng/mL (ref <0.03)
Troponin I: <0.03 ng/mL (ref <0.03)

## 2017-03-12 MED ORDER — METOPROLOL TARTRATE 25 MG PO TABS: 25.0000 mg | ORAL_TABLET | Freq: Two times a day (BID) | ORAL | 11 refills | Status: DC

## 2017-03-12 MED ORDER — LISINOPRIL 10 MG PO TABS
10.0000 mg | ORAL_TABLET | Freq: Once | ORAL | Status: AC
  Administered 2017-03-12: 10 mg via ORAL
  Filled 2017-03-12: qty 1

## 2017-03-12 MED ORDER — LISINOPRIL 10 MG PO TABS: 10.0000 mg | ORAL_TABLET | Freq: Every day | ORAL | 1 refills | Status: DC

## 2017-03-12 MED ORDER — METOPROLOL TARTRATE 25 MG PO TABS
25.0000 mg | ORAL_TABLET | Freq: Once | ORAL | Status: AC
  Administered 2017-03-12: 25 mg via ORAL
  Filled 2017-03-12: qty 1

## 2017-03-12 MED ORDER — OXYCODONE-ACETAMINOPHEN 5-325 MG PO TABS
1.0000 | ORAL_TABLET | Freq: Once | ORAL | Status: AC
  Administered 2017-03-12: 1 via ORAL
  Filled 2017-03-12: qty 1
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
## 2017-03-12 NOTE — ED Notes (Signed)
Pt ambulated to waiting room. Labs drawn and sent.
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
## 2017-04-10 ENCOUNTER — Emergency Department: Payer: Medicare Other

## 2017-04-10 ENCOUNTER — Emergency Department
Admission: EM | Admit: 2017-04-10 | Discharge: 2017-04-11 | Disposition: A | Discharge status: Home / Self Care | Payer: Medicare Other | Attending: Emergency Medicine | Admitting: Emergency Medicine

## 2017-04-10 DIAGNOSIS — Z7982 Long term (current) use of aspirin: Secondary | ICD-10-CM | POA: Diagnosis not present

## 2017-04-10 DIAGNOSIS — R0789 Other chest pain: Secondary | ICD-10-CM | POA: Diagnosis not present

## 2017-04-10 DIAGNOSIS — J45909 Unspecified asthma, uncomplicated: Secondary | ICD-10-CM | POA: Diagnosis not present

## 2017-04-10 DIAGNOSIS — Z955 Presence of coronary angioplasty implant and graft: Secondary | ICD-10-CM | POA: Insufficient documentation

## 2017-04-10 DIAGNOSIS — I10 Essential (primary) hypertension: Secondary | ICD-10-CM | POA: Insufficient documentation

## 2017-04-10 DIAGNOSIS — Z91040 Latex allergy status: Secondary | ICD-10-CM | POA: Diagnosis not present

## 2017-04-10 DIAGNOSIS — E119 Type 2 diabetes mellitus without complications: Secondary | ICD-10-CM | POA: Diagnosis not present

## 2017-04-10 DIAGNOSIS — R079 Chest pain, unspecified: Secondary | ICD-10-CM | POA: Diagnosis present

## 2017-04-10 DIAGNOSIS — Z79899 Other long term (current) drug therapy: Secondary | ICD-10-CM | POA: Insufficient documentation

## 2017-04-10 DIAGNOSIS — I2510 Atherosclerotic heart disease of native coronary artery without angina pectoris: Secondary | ICD-10-CM | POA: Diagnosis not present

## 2017-04-10 DIAGNOSIS — J40 Bronchitis, not specified as acute or chronic: Secondary | ICD-10-CM

## 2017-04-10 DIAGNOSIS — F17210 Nicotine dependence, cigarettes, uncomplicated: Secondary | ICD-10-CM | POA: Diagnosis not present

## 2017-04-10 LAB — CBC
HCT: 40.3 % (ref 35.0–47.0)
Hemoglobin: 13.7 g/dL (ref 12.0–16.0)
MCH: 28.9 pg (ref 26.0–34.0)
MCHC: 33.9 g/dL (ref 32.0–36.0)
MCV: 85.3 fL (ref 80.0–100.0)
Platelets: 376 10*3/uL (ref 150–440)
RBC: 4.72 MIL/uL (ref 3.80–5.20)
RDW: 14.5 % (ref 11.5–14.5)
WBC: 17 10*3/uL — ABNORMAL HIGH (ref 3.6–11.0)

## 2017-04-10 LAB — BASIC METABOLIC PANEL
Anion gap: 10 (ref 5–15)
BUN: 15 mg/dL (ref 6–20)
CO2: 24 mmol/L (ref 22–32)
Calcium: 9.4 mg/dL (ref 8.9–10.3)
Chloride: 102 mmol/L (ref 101–111)
Creatinine, Ser: 0.85 mg/dL (ref 0.44–1.00)
GFR calc Af Amer: >60 mL/min (ref >60)
GFR calc non Af Amer: >60 mL/min (ref >60)
Glucose, Bld: 133 mg/dL — ABNORMAL HIGH (ref 65–99)
Potassium: 3.7 mmol/L (ref 3.5–5.1)
Sodium: 136 mmol/L (ref 135–145)

## 2017-04-10 LAB — TROPONIN I: Troponin I: <0.03 ng/mL (ref <0.03)

## 2017-04-10 MED ORDER — METHYLPREDNISOLONE SODIUM SUCC 125 MG IJ SOLR
125.0000 mg | Freq: Once | INTRAMUSCULAR | Status: AC
  Administered 2017-04-10: 125 mg via INTRAVENOUS
  Filled 2017-04-10: qty 2

## 2017-04-10 MED ORDER — IPRATROPIUM-ALBUTEROL 0.5-2.5 (3) MG/3ML IN SOLN
3.0000 mL | Freq: Once | RESPIRATORY_TRACT | Status: AC
  Administered 2017-04-11: 3 mL via RESPIRATORY_TRACT
  Filled 2017-04-10: qty 3

## 2017-04-10 MED ORDER — KETOROLAC TROMETHAMINE 30 MG/ML IJ SOLN
10.0000 mg | Freq: Once | INTRAMUSCULAR | Status: AC
  Administered 2017-04-10: 9.9 mg via INTRAVENOUS
  Filled 2017-04-10: qty 1

## 2017-04-10 MED ORDER — HYDROCOD POLST-CPM POLST ER 10-8 MG/5ML PO SUER
5.0000 mL | Freq: Once | ORAL | Status: AC
  Administered 2017-04-10: 5 mL via ORAL
  Filled 2017-04-10: qty 5

## 2017-04-10 NOTE — ED Provider Notes (Signed)
[facility] Emergency Department Provider Note   ____________________________________________   First MD Initiated Contact with Patient 04/10/17 2303     (approximate)  I have reviewed the triage vital signs and the nursing notes.   HISTORY  Chief Complaint Chest Pain    HPI Shawna Anderson is a 51 y.o. female brought to the ED from home via EMS with a chief complaint of chest pain. Patient has a history of CAD status post stent in 2010, bipolar disorder, medication noncompliance who has had URI type symptoms for the past 1.5 weeks.She was seen at urgent care last week, prescribed albuterol nebulizer, started on metoprolol for blood pressure and her nitroglycerin tablets were refilled at her request. On awakening at 6 AM yesterday morning, she felt sharp central chest pain, nonradiating. Over the course of the day she took a total of 4 nitroglycerin tablets with partial relief but recurrence of pain. No associated symptoms of diaphoresis, shortness of breath, nausea/vomiting, palpitations or dizziness. Pain exacerbated by movement, not by deep breathing. Denies associated fever, chills, abdominal pain, dysuria, diarrhea. Denies recent travel, trauma or hormone use.   Past Medical History:  Diagnosis Date  . Asthma   . Bipolar 1 disorder ([place])   . Cervical herniated disc 04/12/2016  . Depression   . Diabetes mellitus without complication ([place])   . GERD (gastroesophageal reflux disease)   . Hypertension   . Sleep apnea     Patient Active Problem List   Diagnosis Date Noted  . CAD S/P CFX PCI 2010 04/15/2016  . Essential hypertension 04/15/2016  . Dyslipidemia 04/15/2016  . Bipolar 1 disorder ([place]) 04/15/2016  . Noncompliance with medication regimen 04/15/2016  . Chest pain with moderate risk of acute coronary syndrome 04/12/2016  . Abdominal pain 01/12/2015  . Vomiting 01/12/2015  . Nausea and vomiting 01/12/2015    Past Surgical History:    Procedure Laterality Date  . ABDOMINAL HYSTERECTOMY    . ABDOMINAL SURGERY  1995   Bowel resection.  Marland Kitchen CARDIAC CATHETERIZATION N/A 04/14/2016   Procedure: Left Heart Cath and Coronary Angiography;  Surgeon: Burnell Blanks, MD;  Location: [place] [facility];  Service: Cardiovascular;  Laterality: N/A;  . CARDIAC SURGERY    . CORONARY ANGIOPLASTY WITH STENT PLACEMENT  2010   Drug eluting stent  . OVARIAN CYST REMOVAL      Prior to Admission medications   Medication Sig Start Date End Date Taking? Authorizing Provider  aspirin EC 81 MG tablet Take 81 mg by mouth daily.    [provider]  ibuprofen (ADVIL,MOTRIN) 800 MG tablet Take 1 tablet (800 mg total) by mouth every 8 (eight) hours as needed for mild pain. Patient not taking: Reported on 02/12/2017 09/18/16   Ward, Delice Bison, DO  lisinopril (PRINIVIL,ZESTRIL) 10 MG tablet Take 1 tablet (10 mg total) by mouth daily. 03/12/17 03/12/18  Schuyler Amor, MD  metoprolol tartrate (LOPRESSOR) 25 MG tablet Take 1 tablet (25 mg total) by mouth 2 (two) times daily. 03/12/17 03/12/18  Schuyler Amor, MD  oxyCODONE-acetaminophen (PERCOCET/ROXICET) 5-325 MG tablet Take 1-2 tablets by mouth every 6 (six) hours as needed. Patient not taking: Reported on 02/12/2017 09/18/16   Ward, Delice Bison, DO  predniSONE (STERAPRED UNI-PAK 21 TAB) 10 MG (21) TBPK tablet Take as directed Patient not taking: Reported on 02/12/2017 09/18/16   Ward, Delice Bison, DO  promethazine (PHENERGAN) 25 MG suppository Place 1 suppository (25 mg total) rectally every 6 (six) hours  as needed for nausea or vomiting. Patient not taking: Reported on 03/12/2017 02/12/17   Pisciotta, Elmyra Ricks, PA-C  traZODone (DESYREL) 150 MG tablet Take 150 mg by mouth at bedtime.    [provider]    Allergies Latex; Tape; and Drixoral allergy sinus [dexbromphen-pse-apap er]  Family History  Problem Relation Age of Onset  . CAD Mother   . CAD Brother     Social History Social History   Substance Use Topics  . Smoking status: Current Every Day Smoker    Packs/day: 0.50    Years: 30.00    Types: Cigarettes  . Smokeless tobacco: Never Used     Comment: refused  . Alcohol use Yes     Comment: occasional    Review of Systems  Constitutional: No fever/chills. Eyes: No visual changes. ENT: No sore throat. Cardiovascular: positive for chest pain. Respiratory: positive for nonproductive cough. Denies shortness of breath. Gastrointestinal: No abdominal pain.  No nausea, no vomiting.  No diarrhea.  No constipation. Genitourinary: Negative for dysuria. Musculoskeletal: Negative for back pain. Skin: Negative for rash. Neurological: Negative for headaches, focal weakness or numbness.   ____________________________________________   PHYSICAL EXAM:  VITAL SIGNS: ED Triage Vitals  Enc Vitals Group     BP 04/10/17 2300 (!) 148/89     Pulse Rate 04/10/17 2300 71     Resp 04/10/17 2300 (!) 21     Temp --      Temp src --      SpO2 04/10/17 2300 99 %     Weight 04/10/17 2222 185 lb (83.9 kg)     Height 04/10/17 2222 5\' 3"  (1.6 m)     Head Circumference --      Peak Flow --      Pain Score 04/10/17 2222 3     Pain Loc --      Pain Edu? --      Excl. in [place]? --     Constitutional: Alert and oriented. Well appearing and in no acute distress. Eyes: Conjunctivae are normal. PERRL. EOMI. Head: Atraumatic. Nose: No congestion/rhinnorhea. Mouth/Throat: Mucous membranes are moist.  Oropharynx non-erythematous. Neck: No stridor.   Cardiovascular: Normal rate, regular rhythm. Grossly normal heart sounds.  Good peripheral circulation. Respiratory: Normal respiratory effort.  No retractions. Lungs with scattered rhonchi. Anterior chest wall tender to palpation. Loose cough noted. Gastrointestinal: Soft and nontender. No distention. No abdominal bruits. No CVA tenderness. Musculoskeletal: No lower extremity tenderness nor edema.  No joint effusions. Neurologic:  Normal  speech and language. No gross focal neurologic deficits are appreciated.  Skin:  Skin is warm, dry and intact. No rash noted. Psychiatric: Mood and affect are normal. Speech and behavior are normal.  ____________________________________________   LABS (all labs ordered are listed, but only abnormal results are displayed)  Labs Reviewed  BASIC METABOLIC PANEL - Abnormal; Notable for the following:       Result Value   Glucose, Bld 133 (*)    All other components within normal limits  CBC - Abnormal; Notable for the following:    WBC 17.0 (*)    All other components within normal limits  TROPONIN I  TROPONIN I  TROPONIN I   ____________________________________________  EKG  ED ECG REPORT I, Josceline Chenard J, the attending physician, personally viewed and interpreted this ECG.   Date: 04/11/2017  EKG Time: 2223  Rate: 89  Rhythm: normal EKG, normal sinus rhythm  Axis: LAD  Intervals:none  ST&T Change: nonspecific  ____________________________________________  RADIOLOGY  Dg Chest 2 View  Result Date: 04/10/2017 CLINICAL DATA:  Chest pain and cough EXAM: CHEST  2 VIEW COMPARISON:  03/12/2017 FINDINGS: The heart size and mediastinal contours are within normal limits. Both lungs are clear. The visualized skeletal structures are unremarkable. IMPRESSION: No active cardiopulmonary disease. Electronically Signed   By: Donavan Foil M.D.   On: 04/10/2017 22:57    ____________________________________________   PROCEDURES  Procedure(s) performed: None  Procedures  Critical Care performed: No  ____________________________________________   INITIAL IMPRESSION / ASSESSMENT AND PLAN / ED COURSE  Pertinent labs & imaging results that were available during my care of the patient were reviewed by me and considered in my medical decision making (see chart for details).  51 year old female who presents with chest pain in the setting of URI. Differential diagnosis includes, but is  not limited to, ACS, aortic dissection, pulmonary embolism, cardiac tamponade, pneumothorax, pneumonia, pericarditis/myocarditis, GI-related causes including esophagitis/gastritis, and musculoskeletal chest wall pain.  Laboratory and imaging results demonstrate leukocytosis.  Low suspicion of ACS, PE, dissection.  Will repeat timed troponin, administer DuoNeb, steroid, analgesia and reassess.  ----------------------------------------- 12:13 AM on 04/11/2017 -----------------------------------------   OBSERVATION CARE: This patient is being placed under observation care for the following reasons: Chest pain with repeat testing to rule out ischemia   Clinical Course as of Apr 11 229  Sat Apr 11, 2017  0040 Patient resting in no acute distress. Rhonchi cleared with nebulizer treatment. Awaiting timed repeat troponin.  [JS]  0229 Repeat troponin remains negative. Patient feeling much better. Will discharge home on Z-Pak, prednisone burst, analgesia and she will follow up closely with her PCP. Strict return precautions given. Patient verbalizes understanding and agrees with plan of care.  [JS]    Clinical Course User Index [JS] Paulette Blanch, MD    ----------------------------------------- 2:31 AM on 04/11/2017 -----------------------------------------   END OF OBSERVATION STATUS: After an appropriate period of observation, this patient is being discharged due to the following reason(s):  Repeat negative troponin. ____________________________________________   FINAL CLINICAL IMPRESSION(S) / ED DIAGNOSES  Final diagnoses:  Chest pain, unspecified type  Chest wall pain  Bronchitis      NEW MEDICATIONS STARTED DURING THIS VISIT:  New Prescriptions   No medications on file     Note:  This document was prepared using Dragon voice recognition software and may include unintentional dictation errors.    Paulette Blanch, MD 04/11/17 [phone]

## 2017-04-10 NOTE — ED Triage Notes (Signed)
Pt arrives via [facility] from home for central CP all day. Pt states she wakes up with the pain. It doesn't get any worse with movement or breathing. Pt states she is being treated for URI with albuterol nebulizers. Pt took 2 nitro at 6am when CP began, and 324 ASA. Initial BP was 212/98, came down to 165/86 with EMS. EMS reports pt appeared very anxious upon arrival. Pt has previous stent placement but no MI.

## 2017-04-11 LAB — TROPONIN I
Troponin I: <0.03 ng/mL (ref <0.03)
Troponin I: <0.03 ng/mL (ref <0.03)

## 2017-04-11 MED ORDER — IBUPROFEN 800 MG PO TABS: 800.0000 mg | ORAL_TABLET | Freq: Three times a day (TID) | ORAL | 0 refills | Status: DC | PRN

## 2017-04-11 MED ORDER — HYDROCODONE-ACETAMINOPHEN 5-325 MG PO TABS: 1.0000 | ORAL_TABLET | Freq: Four times a day (QID) | ORAL | 0 refills | Status: DC | PRN

## 2017-04-11 MED ORDER — PREDNISONE 20 MG PO TABS: ORAL_TABLET | ORAL | 0 refills | Status: DC

## 2017-04-11 MED ORDER — AZITHROMYCIN 500 MG PO TABS
500.0000 mg | ORAL_TABLET | Freq: Once | ORAL | Status: AC
  Administered 2017-04-11: 500 mg via ORAL
  Filled 2017-04-11: qty 1

## 2017-04-11 MED ORDER — AZITHROMYCIN 250 MG PO TABS: 250.0000 mg | ORAL_TABLET | Freq: Every day | ORAL | 0 refills | Status: DC

## 2017-04-11 NOTE — ED Notes (Signed)
Pt trying to get in touch with her son for ride home, son has been called multiple times without answer.

## 2017-04-11 NOTE — Discharge Instructions (Signed)
1. Finish antibiotic as prescribed (azithromycin 250 mg daily 4 days). 2. Initial steroid as prescribed (prednisone 60 mg daily 4 days). 3. You may take pain medicines as needed (Motrin/Norco #15). 4. Apply moist heat to affected area several times daily. 5. Return to the ER for worsening symptoms, persistent vomiting, difficulty breathing or other concerns.

## 2017-04-11 NOTE — ED Notes (Signed)

## 2017-04-11 NOTE — ED Notes (Addendum)
Pt not able to find ride home, pt states "I will walk home." Charge RN notified of this, pt given taxi voucher due to safety risk of walking home at 4am. Pt states she is unable to purchase taxi ride and left her things at home when coming by EMS. Pt in lobby eating a snack. First RN notified and given taxi voucher.

## 2017-05-26 ENCOUNTER — Emergency Department: Payer: Medicare Other

## 2017-05-26 ENCOUNTER — Ambulatory Visit: Payer: Self-pay | Admitting: *Deleted

## 2017-05-26 ENCOUNTER — Encounter: Payer: Self-pay | Admitting: *Deleted

## 2017-05-26 ENCOUNTER — Emergency Department
Admission: EM | Admit: 2017-05-26 | Discharge: 2017-05-26 | Disposition: A | Discharge status: Home / Self Care | Payer: Medicare Other | Attending: Student in an Organized Health Care Education/Training Program | Admitting: Student in an Organized Health Care Education/Training Program

## 2017-05-26 DIAGNOSIS — Z7982 Long term (current) use of aspirin: Secondary | ICD-10-CM | POA: Diagnosis not present

## 2017-05-26 DIAGNOSIS — E119 Type 2 diabetes mellitus without complications: Secondary | ICD-10-CM | POA: Diagnosis not present

## 2017-05-26 DIAGNOSIS — Z955 Presence of coronary angioplasty implant and graft: Secondary | ICD-10-CM | POA: Insufficient documentation

## 2017-05-26 DIAGNOSIS — K2100 Gastro-esophageal reflux disease with esophagitis, without bleeding: Secondary | ICD-10-CM

## 2017-05-26 DIAGNOSIS — I10 Essential (primary) hypertension: Secondary | ICD-10-CM | POA: Diagnosis not present

## 2017-05-26 DIAGNOSIS — R109 Unspecified abdominal pain: Secondary | ICD-10-CM | POA: Insufficient documentation

## 2017-05-26 DIAGNOSIS — R112 Nausea with vomiting, unspecified: Secondary | ICD-10-CM | POA: Diagnosis not present

## 2017-05-26 DIAGNOSIS — Z79899 Other long term (current) drug therapy: Secondary | ICD-10-CM | POA: Diagnosis not present

## 2017-05-26 DIAGNOSIS — J45909 Unspecified asthma, uncomplicated: Secondary | ICD-10-CM | POA: Insufficient documentation

## 2017-05-26 DIAGNOSIS — K920 Hematemesis: Secondary | ICD-10-CM | POA: Diagnosis not present

## 2017-05-26 DIAGNOSIS — Z91040 Latex allergy status: Secondary | ICD-10-CM | POA: Diagnosis not present

## 2017-05-26 DIAGNOSIS — F17210 Nicotine dependence, cigarettes, uncomplicated: Secondary | ICD-10-CM | POA: Insufficient documentation

## 2017-05-26 DIAGNOSIS — R1110 Vomiting, unspecified: Secondary | ICD-10-CM | POA: Diagnosis not present

## 2017-05-26 LAB — URINALYSIS, COMPLETE (UACMP) WITH MICROSCOPIC
Bacteria, UA: NONE SEEN
Glucose, UA: NEGATIVE mg/dL
Ketones, ur: 5 mg/dL — AB
Leukocytes, UA: NEGATIVE
Nitrite: NEGATIVE
Protein, ur: 100 mg/dL — AB
Specific Gravity, Urine: 1.026 (ref 1.005–1.030)
pH: 5 (ref 5.0–8.0)

## 2017-05-26 LAB — LIPASE, BLOOD: Lipase: 36 U/L (ref 11–51)

## 2017-05-26 LAB — COMPREHENSIVE METABOLIC PANEL
ALT: 193 U/L — ABNORMAL HIGH (ref 14–54)
AST: 71 U/L — ABNORMAL HIGH (ref 15–41)
Albumin: 4.1 g/dL (ref 3.5–5.0)
Alkaline Phosphatase: 120 U/L (ref 38–126)
Anion gap: 15 (ref 5–15)
BUN: 16 mg/dL (ref 6–20)
CO2: 27 mmol/L (ref 22–32)
Calcium: 9.5 mg/dL (ref 8.9–10.3)
Chloride: 93 mmol/L — ABNORMAL LOW (ref 101–111)
Creatinine, Ser: 0.78 mg/dL (ref 0.44–1.00)
GFR calc Af Amer: >60 mL/min (ref >60)
GFR calc non Af Amer: >60 mL/min (ref >60)
Glucose, Bld: 142 mg/dL — ABNORMAL HIGH (ref 65–99)
Potassium: 2.9 mmol/L — ABNORMAL LOW (ref 3.5–5.1)
Sodium: 135 mmol/L (ref 135–145)
Total Bilirubin: 1.4 mg/dL — ABNORMAL HIGH (ref 0.3–1.2)
Total Protein: 8 g/dL (ref 6.5–8.1)

## 2017-05-26 LAB — CBC
HCT: 43.5 % (ref 35.0–47.0)
Hemoglobin: 14.7 g/dL (ref 12.0–16.0)
MCH: 28.6 pg (ref 26.0–34.0)
MCHC: 33.8 g/dL (ref 32.0–36.0)
MCV: 84.7 fL (ref 80.0–100.0)
Platelets: 465 10*3/uL — ABNORMAL HIGH (ref 150–440)
RBC: 5.13 MIL/uL (ref 3.80–5.20)
RDW: 14 % (ref 11.5–14.5)
WBC: 15.6 10*3/uL — ABNORMAL HIGH (ref 3.6–11.0)

## 2017-05-26 MED ORDER — PROMETHAZINE HCL 12.5 MG PO TABS: 12.5000 mg | ORAL_TABLET | Freq: Four times a day (QID) | ORAL | 0 refills | Status: DC | PRN

## 2017-05-26 MED ORDER — SUCRALFATE 1 G PO TABS
1.0000 g | ORAL_TABLET | Freq: Once | ORAL | Status: AC
  Administered 2017-05-26: 1 g via ORAL
  Filled 2017-05-26: qty 1

## 2017-05-26 MED ORDER — PROMETHAZINE HCL 25 MG RE SUPP: 25.0000 mg | Freq: Four times a day (QID) | RECTAL | 1 refills | Status: DC | PRN

## 2017-05-26 MED ORDER — SODIUM CHLORIDE 0.9 % IV BOLUS (SEPSIS)
1000.0000 mL | Freq: Once | INTRAVENOUS | Status: AC
  Administered 2017-05-26: 1000 mL via INTRAVENOUS

## 2017-05-26 MED ORDER — PROMETHAZINE HCL 25 MG/ML IJ SOLN
12.5000 mg | Freq: Four times a day (QID) | INTRAMUSCULAR | Status: DC | PRN
  Administered 2017-05-26: 12.5 mg via INTRAVENOUS
  Filled 2017-05-26: qty 1

## 2017-05-26 MED ORDER — IOPAMIDOL (ISOVUE-300) INJECTION 61%
100.0000 mL | Freq: Once | INTRAVENOUS | Status: AC | PRN
  Administered 2017-05-26: 100 mL via INTRAVENOUS

## 2017-05-26 MED ORDER — HALOPERIDOL LACTATE 5 MG/ML IJ SOLN
10.0000 mg | Freq: Once | INTRAMUSCULAR | Status: AC
  Administered 2017-05-26: 10 mg via INTRAMUSCULAR
  Filled 2017-05-26: qty 2

## 2017-05-26 MED ORDER — RANITIDINE HCL 150 MG PO TABS: 150.0000 mg | ORAL_TABLET | Freq: Two times a day (BID) | ORAL | 1 refills | Status: DC

## 2017-05-26 MED ORDER — SUCRALFATE 1 G PO TABS: 1.0000 g | ORAL_TABLET | Freq: Three times a day (TID) | ORAL | 0 refills | Status: DC

## 2017-05-26 NOTE — ED Provider Notes (Signed)
[facility] Emergency Department Provider Note    First MD Initiated Contact with Patient 05/26/17 1148     (approximate)  I have reviewed the triage vital signs and the nursing notes.   HISTORY  Chief Complaint Emesis and Sore Throat    HPI Shawna Anderson is a 51 y.o. female 3 of bipolar 1 disorder depression GERD and diabetes as well as she states a history of cyclic vomiting syndrome presents with diffuse mild to moderate, non radiating abdominal pain and 7 days of nausea vomiting.  States that she did have several episodes of vomiting blood.  Denies any history of cirrhosis or alcohol disease.  She tried her nausea suppositories without any improvement.  Present in the ER for further evaluation.  Past Medical History:  Diagnosis Date  . Asthma   . Bipolar 1 disorder ([place])   . Cervical herniated disc 04/12/2016  . Depression   . Diabetes mellitus without complication ([place])   . GERD (gastroesophageal reflux disease)   . Hypertension   . Sleep apnea    Family History  Problem Relation Age of Onset  . CAD Mother   . CAD Brother    Past Surgical History:  Procedure Laterality Date  . ABDOMINAL HYSTERECTOMY    . ABDOMINAL SURGERY  1995   Bowel resection.  Marland Kitchen CARDIAC SURGERY    . CORONARY ANGIOPLASTY WITH STENT PLACEMENT  2010   Drug eluting stent  . OVARIAN CYST REMOVAL     Patient Active Problem List   Diagnosis Date Noted  . CAD S/P CFX PCI 2010 04/15/2016  . Essential hypertension 04/15/2016  . Dyslipidemia 04/15/2016  . Bipolar 1 disorder ([place]) 04/15/2016  . Noncompliance with medication regimen 04/15/2016  . Chest pain with moderate risk of acute coronary syndrome 04/12/2016  . Abdominal pain 01/12/2015  . Vomiting 01/12/2015  . Nausea and vomiting 01/12/2015      Prior to Admission medications   Medication Sig Start Date End Date Taking? Authorizing Provider  albuterol (PROVENTIL) (2.5 MG/3ML) 0.083% nebulizer solution  Inhale 3 mLs 3 (three) times daily as needed into the lungs. 04/02/17  Yes [provider]  aspirin EC 81 MG tablet Take 81 mg by mouth daily.   Yes [provider]  ibuprofen (ADVIL,MOTRIN) 800 MG tablet Take 1 tablet (800 mg total) by mouth every 8 (eight) hours as needed for moderate pain. 04/11/17  Yes Paulette Blanch, MD  metoprolol tartrate (LOPRESSOR) 25 MG tablet Take 1 tablet (25 mg total) by mouth 2 (two) times daily. 03/12/17 03/12/18 Yes Schuyler Amor, MD  PROAIR HFA 108 [phone] Base) MCG/ACT inhaler Inhale 2 puffs every 4 (four) hours as needed into the lungs. 04/02/17  Yes [provider]  promethazine (PHENERGAN) 25 MG suppository Place 1 suppository (25 mg total) rectally every 6 (six) hours as needed for nausea or vomiting. 02/12/17  Yes Pisciotta, Elmyra Ricks, PA-C  HYDROcodone-acetaminophen (NORCO) 5-325 MG tablet Take 1 tablet by mouth every 6 (six) hours as needed for moderate pain. Patient not taking: Reported on 05/26/2017 04/11/17   Paulette Blanch, MD  lisinopril (PRINIVIL,ZESTRIL) 10 MG tablet Take 1 tablet (10 mg total) by mouth daily. Patient not taking: Reported on 05/26/2017 03/12/17 03/12/18  Schuyler Amor, MD  oxyCODONE-acetaminophen (PERCOCET/ROXICET) 5-325 MG tablet Take 1-2 tablets by mouth every 6 (six) hours as needed. Patient not taking: Reported on 02/12/2017 09/18/16   Ward, Delice Bison, DO  predniSONE (DELTASONE) 20 MG tablet 3 tablets  daily x 4 days Patient not taking: Reported on 05/26/2017 04/11/17   Paulette Blanch, MD  promethazine (PHENERGAN) 12.5 MG tablet Take 1 tablet (12.5 mg total) every 6 (six) hours as needed by mouth for nausea or vomiting. 05/26/17   Merlyn Lot, MD  ranitidine (ZANTAC) 150 MG tablet Take 1 tablet (150 mg total) 2 (two) times daily by mouth. 05/26/17 05/26/18  Merlyn Lot, MD  sucralfate (CARAFATE) 1 g tablet Take 1 tablet (1 g total) 3 (three) times daily by mouth. 05/26/17 05/26/18  Merlyn Lot, MD  traZODone  (DESYREL) 150 MG tablet Take 150 mg by mouth at bedtime.    [provider]    Allergies Latex; Tape; and Drixoral allergy sinus [dexbromphen-pse-apap er]    Social History Social History   Tobacco Use  . Smoking status: Current Every Day Smoker    Packs/day: 0.50    Years: 30.00    Pack years: 15.00    Types: Cigarettes  . Smokeless tobacco: Never Used  . Tobacco comment: refused  Substance Use Topics  . Alcohol use: Yes    Comment: occasional  . Drug use: Yes    Frequency: 7.0 times per week    Types: Marijuana    Review of Systems Patient denies headaches, rhinorrhea, blurry vision, numbness, shortness of breath, chest pain, edema, cough, abdominal pain, nausea, vomiting, diarrhea, dysuria, fevers, rashes or hallucinations unless otherwise stated above in HPI. ____________________________________________   PHYSICAL EXAM:  VITAL SIGNS: Vitals:   05/26/17 0844  BP: (!) 218/108  Pulse: (!) 115  Resp: (!) 24  Temp: 98.9 F (37.2 C)  SpO2: 98%    Constitutional: Alert and oriented.  Anxious and tearful  eyes: Conjunctivae are normal.  Head: Atraumatic. Nose: No congestion/rhinnorhea. Mouth/Throat: Mucous membranes are moist.   Neck: No stridor. Painless ROM.  Cardiovascular: Normal rate, regular rhythm. Grossly normal heart sounds.  Good peripheral circulation. Respiratory: Normal respiratory effort.  No retractions. Lungs CTAB. Gastrointestinal: Soft with minimal but mild tenderness in all 4 quadrants.  No rebound or guarding. No distention. No abdominal bruits. No CVA tenderness. Genitourinary:  Musculoskeletal: No lower extremity tenderness nor edema.  No joint effusions. Neurologic:  Normal speech and language. No gross focal neurologic deficits are appreciated. No facial droop Skin:  Skin is warm, dry and intact. No rash noted. Psychiatric: Patient with pressured speech, tearful and emotionally  labile  ____________________________________________   LABS (all labs ordered are listed, but only abnormal results are displayed)  Results for orders placed or performed during the hospital encounter of 05/26/17 (from the past 24 hour(s))  Lipase, blood     Status: None   Collection Time: 05/26/17  8:47 AM  Result Value Ref Range   Lipase 36 11 - 51 U/L  Comprehensive metabolic panel     Status: Abnormal   Collection Time: 05/26/17  8:47 AM  Result Value Ref Range   Sodium 135 135 - 145 mmol/L   Potassium 2.9 (L) 3.5 - 5.1 mmol/L   Chloride 93 (L) 101 - 111 mmol/L   CO2 27 22 - 32 mmol/L   Glucose, Bld 142 (H) 65 - 99 mg/dL   BUN 16 6 - 20 mg/dL   Creatinine, Ser 0.78 0.44 - 1.00 mg/dL   Calcium 9.5 8.9 - 10.3 mg/dL   Total Protein 8.0 6.5 - 8.1 g/dL   Albumin 4.1 3.5 - 5.0 g/dL   AST 71 (H) 15 - 41 U/L   ALT 193 (H) 14 -  54 U/L   Alkaline Phosphatase 120 38 - 126 U/L   Total Bilirubin 1.4 (H) 0.3 - 1.2 mg/dL   GFR calc non Af Amer >60 >60 mL/min   GFR calc Af Amer >60 >60 mL/min   Anion gap 15 5 - 15  CBC     Status: Abnormal   Collection Time: 05/26/17  8:47 AM  Result Value Ref Range   WBC 15.6 (H) 3.6 - 11.0 K/uL   RBC 5.13 3.80 - 5.20 MIL/uL   Hemoglobin 14.7 12.0 - 16.0 g/dL   HCT 43.5 35.0 - 47.0 %   MCV 84.7 80.0 - 100.0 fL   MCH 28.6 26.0 - 34.0 pg   MCHC 33.8 32.0 - 36.0 g/dL   RDW 14.0 11.5 - 14.5 %   Platelets 465 (H) 150 - 440 K/uL  Urinalysis, Complete w Microscopic     Status: Abnormal   Collection Time: 05/26/17  8:47 AM  Result Value Ref Range   Color, Urine AMBER (A) YELLOW   APPearance CLOUDY (A) CLEAR   Specific Gravity, Urine 1.026 1.005 - 1.030   pH 5.0 5.0 - 8.0   Glucose, UA NEGATIVE NEGATIVE mg/dL   Hgb urine dipstick SMALL (A) NEGATIVE   Bilirubin Urine SMALL (A) NEGATIVE   Ketones, ur 5 (A) NEGATIVE mg/dL   Protein, ur 100 (A) NEGATIVE mg/dL   Nitrite NEGATIVE NEGATIVE   Leukocytes, UA NEGATIVE NEGATIVE   RBC / HPF 6-30 0 - 5  RBC/hpf   WBC, UA 6-30 0 - 5 WBC/hpf   Bacteria, UA NONE SEEN NONE SEEN   Squamous Epithelial / LPF TOO NUMEROUS TO COUNT (A) NONE SEEN   Mucus PRESENT    Hyaline Casts, UA PRESENT    ____________________________________________  EKG My review and personal interpretation at Time: 10:27   Indication: abdominal pain  Rate: 95  Rhythm: sinus Axis: normal Other: normal intervals, no stemi, non specific st changes ____________________________________________  RADIOLOGY  I personally reviewed all radiographic images ordered to evaluate for the above acute complaints and reviewed radiology reports and findings.  These findings were personally discussed with the patient.  Please see medical record for radiology report.  ____________________________________________   PROCEDURES  Procedure(s) performed:  Procedures    Critical Care performed: no ____________________________________________   INITIAL IMPRESSION / ASSESSMENT AND PLAN / ED COURSE  Pertinent labs & imaging results that were available during my care of the patient were reviewed by me and considered in my medical decision making (see chart for details).  DDX: Appendicitis colitis, cholelithiasis, gastritis, Boerhaave's, Mallory-Weiss tear, pneumonia, ACS, cyclic vomiting, mania  Shawna Anderson is a 51 y.o. who presents to the ED with nausea vomiting for the past several days diffuse abdominal pain.  Patient tearful and very anxious appearing.  Based on presentation will send blood work and obtaining radiographs for the above differential.  Will provide IV fluids as well as antiemetics. The patient will be placed on continuous pulse oximetry and telemetry for monitoring.  Laboratory evaluation will be sent to evaluate for the above complaints.     Clinical Course as of May 26 1445  Tue May 26, 2017  1325 CT imaging shows no evidence of acute abnormality.  Some borderline thickening of the distal esophagus could  suggest reflux disease therefore will give Carafate.  Nothing to suggest Boerhaave's or esophageal rupture.  Patient does appear more comfortable after calling medications and Haldol for nausea.  Repeat abdominal exam is soft and benign.  Based on her pressured speech and my concern for mania will have patient evaluated by psychiatry.  [PR]  1441 Patient is tolerating oral hydration.  Seems much more appropriate after medications.  Repeat abdominal exam is soft and benign.  I recommended patient stay for psychiatric evaluation but patient is declining this stating that she would rather follow-up as an outpatient for which I will give her referral. Have discussed with the patient and available family all diagnostics and treatments performed thus far and all questions were answered to the best of my ability. The patient demonstrates understanding and agreement with plan.   [PR]    Clinical Course User Index [PR] Merlyn Lot, MD     ____________________________________________   FINAL CLINICAL IMPRESSION(S) / ED DIAGNOSES  Final diagnoses:  Non-intractable vomiting with nausea, unspecified vomiting type  Reflux esophagitis      NEW MEDICATIONS STARTED DURING THIS VISIT:  This SmartLink is deprecated. Use AVSMEDLIST instead to display the medication list for a patient.   Note:  This document was prepared using Dragon voice recognition software and may include unintentional dictation errors.    Merlyn Lot, MD 05/26/17 [id number]

## 2017-05-26 NOTE — ED Notes (Signed)
AAOx3.  Skin warm and dry.  NAD 

## 2017-05-26 NOTE — ED Notes (Signed)
Patient informed staff she is bi-polar and has not been on her meds for almost 3 years

## 2017-05-26 NOTE — ED Triage Notes (Signed)
Pt states she has been vomiting blood since Wednesday, states sore throat and she has been taking tylenol pm for 7 days and sleeping, pt very anxious in triage, states she has only been eating italian ice, pt awake and alert, crying and gagging in triage

## 2017-05-26 NOTE — Discharge Instructions (Signed)

## 2017-05-26 NOTE — Telephone Encounter (Signed)
C/o vomiting 5 days ago all day long with some blood in it.  "I'm not able to eat or drink anything this morning because my throat and esophagus are burning so bad".  "I've been using Chloraseptic spray and taking Ibuprofen but it's not helping".  She explained that she was "assigned" to Dr. Sanda Klein but has never seen her.  She was requesting to see her for todays problem.   I called the flow coordinator at Dr. Delight Ovens office and discussed the situation with her.   Dr. Sanda Klein is not taking new pts.   They do not have a record of this pt at all.  I recommended the pt go to [facility] ED since that is where she has been receiving care for this problem since she is not established with a PCP. Pt was agreeable to going to the ED. Reason for Disposition . SEVERE (e.g., excruciating) throat pain  Answer Assessment - Initial Assessment Questions 1. ONSET: "When did the throat start hurting?" (Hours or days ago)      5 days ago I vomited blood and now my throat and esophagus is burning.  The suppository stopped the vomiting. 2. SEVERITY: "How bad is the sore throat?" (Scale 1-10; mild, moderate or severe)   - MILD (1-3):  doesn't interfere with eating or normal activities   - MODERATE (4-7): interferes with eating some solids and normal activities   - SEVERE (8-10):  excruciating pain, interferes with most normal activities   - SEVERE DYSPHAGIA: can't swallow liquids, drooling     10  It's burning so bad I can't eat or drink. 3. STREP EXPOSURE: "Has there been any exposure to strep within the past week?" If so, ask: "What type of contact occurred?"      No 4.  VIRAL SYMPTOMS: "Are there any symptoms of a cold, such as a runny nose, cough, hoarse voice or red eyes?"      No 5. FEVER: "Do you have a fever?" If so, ask: "What is your temperature, how was it measured, and when did it start?"     Don't think so. 6. PUS ON THE TONSILS: "Is there pus on the tonsils in the back of your  throat?"     I'm just burning so bad inside my throat and esophagus when  Eat. 7. OTHER SYMPTOMS: "Do you have any other symptoms?" (e.g., difficulty breathing, headache, rash)     *No Answer* 8. PREGNANCY: "Is there any chance you are pregnant?" "When was your last menstrual period?"     *No Answer*  Protocols used: SORE THROAT-A-AH

## 2017-10-28 DIAGNOSIS — M5431 Sciatica, right side: Secondary | ICD-10-CM | POA: Diagnosis not present

## 2017-11-14 ENCOUNTER — Emergency Department: Payer: Medicare Other

## 2017-11-14 ENCOUNTER — Encounter: Payer: Self-pay | Admitting: Emergency Medicine

## 2017-11-14 ENCOUNTER — Emergency Department
Admission: EM | Admit: 2017-11-14 | Discharge: 2017-11-14 | Disposition: A | Discharge status: Home / Self Care | Payer: Medicare Other | Attending: Emergency Medicine | Admitting: Emergency Medicine

## 2017-11-14 ENCOUNTER — Other Ambulatory Visit: Payer: Self-pay

## 2017-11-14 DIAGNOSIS — E119 Type 2 diabetes mellitus without complications: Secondary | ICD-10-CM | POA: Insufficient documentation

## 2017-11-14 DIAGNOSIS — F17210 Nicotine dependence, cigarettes, uncomplicated: Secondary | ICD-10-CM | POA: Diagnosis not present

## 2017-11-14 DIAGNOSIS — Z91040 Latex allergy status: Secondary | ICD-10-CM | POA: Diagnosis not present

## 2017-11-14 DIAGNOSIS — I10 Essential (primary) hypertension: Secondary | ICD-10-CM | POA: Diagnosis not present

## 2017-11-14 DIAGNOSIS — R1110 Vomiting, unspecified: Secondary | ICD-10-CM | POA: Diagnosis not present

## 2017-11-14 DIAGNOSIS — J45909 Unspecified asthma, uncomplicated: Secondary | ICD-10-CM | POA: Diagnosis not present

## 2017-11-14 DIAGNOSIS — R109 Unspecified abdominal pain: Secondary | ICD-10-CM | POA: Diagnosis not present

## 2017-11-14 DIAGNOSIS — Z79899 Other long term (current) drug therapy: Secondary | ICD-10-CM | POA: Diagnosis not present

## 2017-11-14 DIAGNOSIS — R51 Headache: Secondary | ICD-10-CM | POA: Diagnosis not present

## 2017-11-14 DIAGNOSIS — G43A0 Cyclical vomiting, not intractable: Secondary | ICD-10-CM | POA: Insufficient documentation

## 2017-11-14 DIAGNOSIS — Z7982 Long term (current) use of aspirin: Secondary | ICD-10-CM | POA: Diagnosis not present

## 2017-11-14 DIAGNOSIS — R112 Nausea with vomiting, unspecified: Secondary | ICD-10-CM | POA: Diagnosis present

## 2017-11-14 DIAGNOSIS — R1115 Cyclical vomiting syndrome unrelated to migraine: Secondary | ICD-10-CM

## 2017-11-14 LAB — CBC
HCT: 46.4 % (ref 35.0–47.0)
Hemoglobin: 15.5 g/dL (ref 12.0–16.0)
MCH: 28.5 pg (ref 26.0–34.0)
MCHC: 33.3 g/dL (ref 32.0–36.0)
MCV: 85.3 fL (ref 80.0–100.0)
Platelets: 544 10*3/uL — ABNORMAL HIGH (ref 150–440)
RBC: 5.43 MIL/uL — ABNORMAL HIGH (ref 3.80–5.20)
RDW: 14.3 % (ref 11.5–14.5)
WBC: 20 10*3/uL — ABNORMAL HIGH (ref 3.6–11.0)

## 2017-11-14 LAB — COMPREHENSIVE METABOLIC PANEL
ALT: 41 U/L (ref 14–54)
AST: 31 U/L (ref 15–41)
Albumin: 4.8 g/dL (ref 3.5–5.0)
Alkaline Phosphatase: 116 U/L (ref 38–126)
Anion gap: 11 (ref 5–15)
BUN: 13 mg/dL (ref 6–20)
CO2: 25 mmol/L (ref 22–32)
Calcium: 9.7 mg/dL (ref 8.9–10.3)
Chloride: 104 mmol/L (ref 101–111)
Creatinine, Ser: 0.69 mg/dL (ref 0.44–1.00)
GFR calc Af Amer: >60 mL/min (ref >60)
GFR calc non Af Amer: >60 mL/min (ref >60)
Glucose, Bld: 158 mg/dL — ABNORMAL HIGH (ref 65–99)
Potassium: 3.9 mmol/L (ref 3.5–5.1)
Sodium: 140 mmol/L (ref 135–145)
Total Bilirubin: 0.7 mg/dL (ref 0.3–1.2)
Total Protein: 8.7 g/dL — ABNORMAL HIGH (ref 6.5–8.1)

## 2017-11-14 LAB — LIPASE, BLOOD: Lipase: 26 U/L (ref 11–51)

## 2017-11-14 LAB — TROPONIN I: Troponin I: <0.03 ng/mL

## 2017-11-14 MED ORDER — HALOPERIDOL LACTATE 5 MG/ML IJ SOLN
2.0000 mg | Freq: Once | INTRAMUSCULAR | Status: AC
  Administered 2017-11-14: 2 mg via INTRAMUSCULAR
  Filled 2017-11-14: qty 1

## 2017-11-14 MED ORDER — LABETALOL HCL 100 MG PO TABS
100.0000 mg | ORAL_TABLET | ORAL | Status: AC
  Administered 2017-11-14: 100 mg via ORAL
  Filled 2017-11-14: qty 1

## 2017-11-14 MED ORDER — IOPAMIDOL (ISOVUE-370) INJECTION 76%
75.0000 mL | Freq: Once | INTRAVENOUS | Status: AC | PRN
  Administered 2017-11-14: 75 mL via INTRAVENOUS

## 2017-11-14 MED ORDER — PROMETHAZINE HCL 25 MG/ML IJ SOLN
12.5000 mg | Freq: Once | INTRAMUSCULAR | Status: AC
  Administered 2017-11-14: 12.5 mg via INTRAMUSCULAR
  Filled 2017-11-14: qty 1

## 2017-11-14 MED ORDER — PROMETHAZINE HCL 25 MG RE SUPP: 25.0000 mg | Freq: Four times a day (QID) | RECTAL | 0 refills | Status: DC | PRN

## 2017-11-14 MED ORDER — ONDANSETRON 4 MG PO TBDP
4.0000 mg | ORAL_TABLET | Freq: Once | ORAL | Status: AC | PRN
  Administered 2017-11-14: 4 mg via ORAL
  Filled 2017-11-14: qty 1

## 2017-11-14 MED ORDER — ONDANSETRON HCL 4 MG/2ML IJ SOLN
4.0000 mg | Freq: Once | INTRAMUSCULAR | Status: AC
  Administered 2017-11-14: 4 mg via INTRAVENOUS
  Filled 2017-11-14: qty 2

## 2017-11-14 MED ORDER — FAMOTIDINE 20 MG PO TABS
40.0000 mg | ORAL_TABLET | Freq: Once | ORAL | Status: AC
  Administered 2017-11-14: 40 mg via ORAL
  Filled 2017-11-14: qty 2

## 2017-11-14 MED ORDER — LABETALOL HCL 100 MG PO TABS: 100.0000 mg | ORAL_TABLET | Freq: Two times a day (BID) | ORAL | 0 refills | Status: DC

## 2017-11-14 MED ORDER — LORAZEPAM 2 MG/ML IJ SOLN
1.0000 mg | Freq: Once | INTRAMUSCULAR | Status: DC
Start: 2017-11-14 — End: 2017-11-14
  Filled 2017-11-14: qty 1

## 2017-11-14 MED ORDER — MORPHINE SULFATE (PF) 4 MG/ML IV SOLN
4.0000 mg | Freq: Once | INTRAVENOUS | Status: AC
  Administered 2017-11-14: 4 mg via INTRAVENOUS
  Filled 2017-11-14: qty 1

## 2017-11-14 MED ORDER — GI COCKTAIL ~~LOC~~
30.0000 mL | Freq: Once | ORAL | Status: AC
  Administered 2017-11-14: 30 mL via ORAL
  Filled 2017-11-14: qty 30

## 2017-11-14 MED ORDER — LABETALOL HCL 5 MG/ML IV SOLN
10.0000 mg | Freq: Once | INTRAVENOUS | Status: AC
  Administered 2017-11-14: 10 mg via INTRAVENOUS
  Filled 2017-11-14: qty 4

## 2017-11-14 MED ORDER — SODIUM CHLORIDE 0.9 % IV BOLUS
500.0000 mL | Freq: Once | INTRAVENOUS | Status: AC
  Administered 2017-11-14: 500 mL via INTRAVENOUS

## 2017-11-14 NOTE — ED Notes (Signed)
Patient given PO challenge of graham crackers and ginger ale, BP retaken on other arm.

## 2017-11-14 NOTE — ED Provider Notes (Signed)
[facility] Emergency Department Provider Note   ____________________________________________   First MD Initiated Contact with Patient 11/14/17 1506     (approximate)  I have reviewed the triage vital signs and the nursing notes.   HISTORY  Chief Complaint Abdominal Pain and Emesis    HPI Shawna Anderson is a 52 y.o. female reports she has a history of "cyclical" vomiting.  She reports this that will happen every few months she will suddenly developed severe nausea and vomiting.  She reports it seems similar in the past is about 8 out of 10 steady pain in the mid abdomen slightly around the bellybutton region as well but mostly above it.  Started suddenly this morning.  She felt when she woke up like her stomach seemed "distended" it is improved now but she is continued to have ongoing nausea and vomiting.  She reports she is vomited up many times now when she tries to throw up her stomach is completely empty.  No fevers or chills.  No chest pain or trouble breathing.  No numbness or tingling.  Denies history of alcoholism does use occasionally.  Thinks this may have come about after eating shrimp last night.  Patient also reports that Haldol has assisted in her nausea in the past.   Past Medical History:  Diagnosis Date  . Asthma   . Bipolar 1 disorder ([place])   . Cervical herniated disc 04/12/2016  . Depression   . Diabetes mellitus without complication ([place])   . GERD (gastroesophageal reflux disease)   . Hypertension   . Sleep apnea     Patient Active Problem List   Diagnosis Date Noted  . CAD S/P CFX PCI 2010 04/15/2016  . Essential hypertension 04/15/2016  . Dyslipidemia 04/15/2016  . Bipolar 1 disorder ([place]) 04/15/2016  . Noncompliance with medication regimen 04/15/2016  . Chest pain with moderate risk of acute coronary syndrome 04/12/2016  . Abdominal pain 01/12/2015  . Vomiting 01/12/2015  . Nausea and vomiting 01/12/2015    Past  Surgical History:  Procedure Laterality Date  . ABDOMINAL HYSTERECTOMY    . ABDOMINAL SURGERY  1995   Bowel resection.  Marland Kitchen CARDIAC CATHETERIZATION N/A 04/14/2016   Procedure: Left Heart Cath and Coronary Angiography;  Surgeon: Burnell Blanks, MD;  Location: [place] CV LAB;  Service: Cardiovascular;  Laterality: N/A;  . CARDIAC SURGERY    . CORONARY ANGIOPLASTY WITH STENT PLACEMENT  2010   Drug eluting stent  . OVARIAN CYST REMOVAL      Prior to Admission medications   Medication Sig Start Date End Date Taking? Authorizing Provider  albuterol (PROVENTIL) (2.5 MG/3ML) 0.083% nebulizer solution Inhale 3 mLs 3 (three) times daily as needed into the lungs. 04/02/17   [provider]  aspirin EC 81 MG tablet Take 81 mg by mouth daily.    [provider]  HYDROcodone-acetaminophen (NORCO) 5-325 MG tablet Take 1 tablet by mouth every 6 (six) hours as needed for moderate pain. Patient not taking: Reported on 05/26/2017 04/11/17   Paulette Blanch, MD  ibuprofen (ADVIL,MOTRIN) 800 MG tablet Take 1 tablet (800 mg total) by mouth every 8 (eight) hours as needed for moderate pain. 04/11/17   Paulette Blanch, MD  lisinopril (PRINIVIL,ZESTRIL) 10 MG tablet Take 1 tablet (10 mg total) by mouth daily. Patient not taking: Reported on 05/26/2017 03/12/17 03/12/18  Schuyler Amor, MD  metoprolol tartrate (LOPRESSOR) 25 MG tablet Take 1 tablet (25 mg total) by mouth  2 (two) times daily. 03/12/17 03/12/18  Schuyler Amor, MD  oxyCODONE-acetaminophen (PERCOCET/ROXICET) 5-325 MG tablet Take 1-2 tablets by mouth every 6 (six) hours as needed. Patient not taking: Reported on 02/12/2017 09/18/16   Ward, Delice [place], DO  predniSONE (DELTASONE) 20 MG tablet 3 tablets daily x 4 days Patient not taking: Reported on 05/26/2017 04/11/17   Paulette Blanch, MD  [facility] HFA 108 [id number] Base) MCG/ACT inhaler Inhale 2 puffs every 4 (four) hours as needed into the lungs. 04/02/17   [provider]  promethazine  (PHENERGAN) 12.5 MG tablet Take 1 tablet (12.5 mg total) every 6 (six) hours as needed by mouth for nausea or vomiting. 05/26/17   Merlyn Lot, MD  promethazine (PHENERGAN) 25 MG suppository Place 1 suppository (25 mg total) rectally every 6 (six) hours as needed for nausea or vomiting. 02/12/17   Pisciotta, Elmyra Ricks, PA-C  promethazine (PHENERGAN) 25 MG suppository Place 1 suppository (25 mg total) every 6 (six) hours as needed rectally for nausea. 05/26/17 05/26/18  Merlyn Lot, MD  ranitidine (ZANTAC) 150 MG tablet Take 1 tablet (150 mg total) 2 (two) times daily by mouth. 05/26/17 05/26/18  Merlyn Lot, MD  sucralfate (CARAFATE) 1 g tablet Take 1 tablet (1 g total) 3 (three) times daily by mouth. 05/26/17 05/26/18  Merlyn Lot, MD  traZODone (DESYREL) 150 MG tablet Take 150 mg by mouth at bedtime.    [provider]    Allergies Ativan [lorazepam]; Latex; Tape; and Drixoral allergy sinus [dexbromphen-pse-apap er]  Family History  Problem Relation Age of Onset  . CAD Mother   . CAD Brother     Social History Social History   Tobacco Use  . Smoking status: Current Every Day Smoker    Packs/day: 0.50    Years: 30.00    Pack years: 15.00    Types: Cigarettes  . Smokeless tobacco: Never Used  . Tobacco comment: refused  Substance Use Topics  . Alcohol use: Yes    Comment: occasional  . Drug use: Yes    Frequency: 7.0 times per week    Types: Marijuana    Review of Systems Constitutional: No fever/chills Eyes: No visual changes. ENT: No sore throat. Cardiovascular: Denies chest pain. Respiratory: Denies shortness of breath. Gastrointestinal: No diarrhea.  No constipation. Genitourinary: Negative for dysuria. Musculoskeletal: Negative for back pain. Skin: Negative for rash. Neurological: Negative for headaches, focal weakness or numbness.    ____________________________________________   PHYSICAL EXAM:  VITAL SIGNS: ED Triage Vitals    Enc Vitals Group     BP 11/14/17 1434 (!) 160/115     Pulse Rate 11/14/17 1434 (!) 105     Resp 11/14/17 1434 20     Temp 11/14/17 1434 97.7 F (36.5 C)     Temp Source 11/14/17 1434 Axillary     SpO2 11/14/17 1434 96 %     Weight 11/14/17 1431 190 lb (86.2 kg)     Height 11/14/17 1431 5\' 3"  (1.6 m)     Head Circumference --      Peak Flow --      Pain Score 11/14/17 1431 7     Pain Loc --      Pain Edu? --      Excl. in [place]? --     Constitutional: Alert and oriented.  Laying on her stomach, retching.  She appears in discomfort.  She is fully oriented.  Reports she feels miserable and having severe nausea and pain in the upper  mid abdomen Eyes: Conjunctivae are normal. Head: Atraumatic. Nose: No congestion/rhinnorhea. Mouth/Throat: Mucous membranes are moist. Neck: No stridor.   Cardiovascular: Normal rate, regular rhythm. Grossly normal heart sounds.  Good peripheral circulation. Respiratory: Normal respiratory effort.  No retractions. Lungs CTAB. Gastrointestinal: Soft and nontender to the lower quadrants, but reports moderate tenderness especially in the mid epigastrium without rebound guarding or distention.  There is no peritonitis in any quadrant. No distention.  Bowel sounds are present. Musculoskeletal: No lower extremity tenderness nor edema. Neurologic:  Normal speech and language. No gross focal neurologic deficits are appreciated.  Skin:  Skin is warm, dry and intact. No rash noted. Psychiatric: Mood and affect are normal. Speech and behavior are normal.  ____________________________________________   LABS (all labs ordered are listed, but only abnormal results are displayed)  Labs Reviewed  COMPREHENSIVE METABOLIC PANEL - Abnormal; Notable for the following components:      Result Value   Glucose, Bld 158 (*)    Total Protein 8.7 (*)    All other components within normal limits  CBC - Abnormal; Notable for the following components:   WBC 20.0 (*)    RBC 5.43  (*)    Platelets 544 (*)    All other components within normal limits  LIPASE, BLOOD  TROPONIN I  URINALYSIS, COMPLETE (UACMP) WITH MICROSCOPIC  URINE DRUG SCREEN, QUALITATIVE ([facility] ONLY)   ____________________________________________  EKG  Reviewed enterotomy at 1700 Heart rate 120 QTc 470 Sinus tachycardia, no evidence of acute ischemia. ____________________________________________  RADIOLOGY  CT of the head negative for acute  CT abdomen pelvis shows small hiatal hernia, negative for acute finding ____________________________________________   PROCEDURES  Procedure(s) performed: None  Procedures  Critical Care performed: Yes, see critical care note(s)  CRITICAL CARE Performed by: Delman Kitten   Total critical care time: 35 minutes  Critical care time was exclusive of separately billable procedures and treating other patients.  Critical care was necessary to treat or prevent imminent or life-threatening deterioration.  Critical care was time spent personally by me on the following activities: development of treatment plan with patient and/or surrogate as well as nursing, discussions with consultants, evaluation of patient's response to treatment, examination of patient, obtaining history from patient or surrogate, ordering and performing treatments and interventions, ordering and review of laboratory studies, ordering and review of radiographic studies, pulse oximetry and re-evaluation of patient's condition.  ____________________________________________   INITIAL IMPRESSION / ASSESSMENT AND PLAN / ED COURSE  Pertinent labs & imaging results that were available during my care of the patient were reviewed by me and considered in my medical decision making (see chart for details).  Differential diagnosis includes but is not limited to, abdominal perforation, aortic dissection, cholecystitis, appendicitis, diverticulitis, colitis, esophagitis/gastritis, kidney  stone, pyelonephritis, urinary tract infection, aortic aneurysm. All are considered in decision and treatment plan. Based upon the patient's presentation and risk factors, it is somewhat unclear as to cause but I am concerned given her degree of nausea with amount of reported vomiting that she needs evaluation including labs and CT imaging to further evaluate.  Also consider the possibility of cyclical vomiting possibly, by cannabinoid use, she is a regular user and has not cut back.  We will treat her with additional antiemetics including Haldol which she reports has had good antiemetic effect in the past.   ----------------------------------------- 5:24 PM on 11/14/2017 -----------------------------------------  Patient reports she thinks her blood pressure may be a problem.  Her blood pressures been rechecked, reports  she supposed to be on blood pressure medicine 3 years ago but is not continued on any.  Blood pressure noted now and confirmed on recheck to be 251/127.  Her nausea is improving, now her blood pressure is critically high.  Her to CT the head to exclude intracranial hemorrhage given the patient's associated headache, also evaluate for possible other etiologies of headache including PR ES though my overall suspicion does not show any neurologic abnormality.  Will give IV labetalol as the patient is slightly tachycardic and also notably hypertensive at this time.  Continue to monitor her closely  Clinical Course as of Nov 14 1937  Sat Nov 14, 2017  1801 Blood pressure improved to 528/413 systolic.   [MQ]  1937 BP 150/84. Eating crackers and water. Tolerating well. Reports and appears feeling much improved.    [MQ]    Clinical Course User Index [MQ] Delman Kitten, MD   ----------------------------------------- 5:35 PM on 11/14/2017 -----------------------------------------  Patient reports ongoing retching.  Will give additional antiemetic including this time 1 mg of Ativan.   Patient has now received labetalol as well continue to monitor blood pressures closely.  CT scan of the abdomen pelvis negative for any obvious acute etiology.  ----------------------------------------- 7:38 PM on 11/14/2017 -----------------------------------------  Patient feeling much improved.  Blood pressure much improved now currently 150/84.  Tolerating by mouth well.  Reports she feels much improved.  At this point, recommend the patient establish primary care doctor with close follow-up.  She is a history of cyclical vomiting which appears to be improved now after hydration and antiemetics.  Will provide rectal Phenergan which the patient reports is worked well in the past for her vomiting, in addition start on labetalol with close follow-up recommendations.  Patient agreeable.  ____________________________________________   FINAL CLINICAL IMPRESSION(S) / ED DIAGNOSES  Final diagnoses:  Hypertension, unspecified type  Non-intractable cyclical vomiting with nausea      NEW MEDICATIONS STARTED DURING THIS VISIT:  New Prescriptions   No medications on file     Note:  This document was prepared using Dragon voice recognition software and may include unintentional dictation errors.     Delman Kitten, MD 11/14/17 [id number]

## 2017-11-14 NOTE — ED Notes (Signed)
Family at bedside. 

## 2017-11-14 NOTE — ED Notes (Addendum)
Pt states she has HTN but hasn't taken any medications x 3 years. C/o of headache at this time. Alert, oriented, moving all extremities. No distress noted.

## 2017-11-14 NOTE — ED Triage Notes (Signed)
C/O abdominal pain and nausea since this morning.  C/O mid abdominal pain.

## 2017-11-15 DIAGNOSIS — Z79899 Other long term (current) drug therapy: Secondary | ICD-10-CM | POA: Diagnosis not present

## 2017-11-15 DIAGNOSIS — E119 Type 2 diabetes mellitus without complications: Secondary | ICD-10-CM | POA: Diagnosis not present

## 2017-11-15 DIAGNOSIS — Z91040 Latex allergy status: Secondary | ICD-10-CM | POA: Insufficient documentation

## 2017-11-15 DIAGNOSIS — Z955 Presence of coronary angioplasty implant and graft: Secondary | ICD-10-CM | POA: Diagnosis not present

## 2017-11-15 DIAGNOSIS — R61 Generalized hyperhidrosis: Secondary | ICD-10-CM | POA: Diagnosis not present

## 2017-11-15 DIAGNOSIS — R0602 Shortness of breath: Secondary | ICD-10-CM | POA: Diagnosis not present

## 2017-11-15 DIAGNOSIS — R0789 Other chest pain: Secondary | ICD-10-CM | POA: Diagnosis not present

## 2017-11-15 DIAGNOSIS — J45909 Unspecified asthma, uncomplicated: Secondary | ICD-10-CM | POA: Insufficient documentation

## 2017-11-15 DIAGNOSIS — I10 Essential (primary) hypertension: Secondary | ICD-10-CM | POA: Insufficient documentation

## 2017-11-15 DIAGNOSIS — Z7982 Long term (current) use of aspirin: Secondary | ICD-10-CM | POA: Diagnosis not present

## 2017-11-15 DIAGNOSIS — F17210 Nicotine dependence, cigarettes, uncomplicated: Secondary | ICD-10-CM | POA: Diagnosis not present

## 2017-11-15 DIAGNOSIS — R079 Chest pain, unspecified: Secondary | ICD-10-CM | POA: Diagnosis present

## 2017-11-15 DIAGNOSIS — F319 Bipolar disorder, unspecified: Secondary | ICD-10-CM | POA: Diagnosis not present

## 2017-11-15 DIAGNOSIS — R110 Nausea: Secondary | ICD-10-CM | POA: Diagnosis not present

## 2017-11-15 NOTE — ED Triage Notes (Signed)
Pt stated "I have a cardiac stent, I've had 4 episodes in the last 24 hours.  I've taken NTG 4 times."

## 2017-11-16 ENCOUNTER — Emergency Department (HOSPITAL_COMMUNITY)
Admission: EM | Admit: 2017-11-16 | Discharge: 2017-11-16 | Disposition: A | Discharge status: Home / Self Care | Payer: Medicare Other | Attending: Emergency Medicine | Admitting: Emergency Medicine

## 2017-11-16 ENCOUNTER — Emergency Department (HOSPITAL_COMMUNITY): Payer: Medicare Other

## 2017-11-16 ENCOUNTER — Encounter (HOSPITAL_COMMUNITY): Payer: Self-pay | Admitting: *Deleted

## 2017-11-16 DIAGNOSIS — R0602 Shortness of breath: Secondary | ICD-10-CM | POA: Diagnosis not present

## 2017-11-16 DIAGNOSIS — R0789 Other chest pain: Secondary | ICD-10-CM

## 2017-11-16 LAB — RAPID URINE DRUG SCREEN, HOSP PERFORMED
Amphetamines: NOT DETECTED
Barbiturates: POSITIVE — AB
Benzodiazepines: NOT DETECTED
Cocaine: POSITIVE — AB
Opiates: POSITIVE — AB
Tetrahydrocannabinol: NOT DETECTED

## 2017-11-16 LAB — LIPASE, BLOOD: Lipase: 40 U/L (ref 11–51)

## 2017-11-16 LAB — URINALYSIS, ROUTINE W REFLEX MICROSCOPIC
Bacteria, UA: NONE SEEN
Glucose, UA: NEGATIVE mg/dL
Hgb urine dipstick: NEGATIVE
Ketones, ur: 5 mg/dL — AB
Nitrite: NEGATIVE
Protein, ur: 100 mg/dL — AB
Specific Gravity, Urine: 1.04 — ABNORMAL HIGH (ref 1.005–1.030)
pH: 5 (ref 5.0–8.0)

## 2017-11-16 LAB — COMPREHENSIVE METABOLIC PANEL
ALT: 42 U/L (ref 14–54)
AST: 33 U/L (ref 15–41)
Albumin: 3.7 g/dL (ref 3.5–5.0)
Alkaline Phosphatase: 98 U/L (ref 38–126)
Anion gap: 8 (ref 5–15)
BUN: 23 mg/dL — ABNORMAL HIGH (ref 6–20)
CO2: 25 mmol/L (ref 22–32)
Calcium: 8.9 mg/dL (ref 8.9–10.3)
Chloride: 103 mmol/L (ref 101–111)
Creatinine, Ser: 0.89 mg/dL (ref 0.44–1.00)
GFR calc Af Amer: >60 mL/min (ref >60)
GFR calc non Af Amer: >60 mL/min (ref >60)
Glucose, Bld: 116 mg/dL — ABNORMAL HIGH (ref 65–99)
Potassium: 3.7 mmol/L (ref 3.5–5.1)
Sodium: 136 mmol/L (ref 135–145)
Total Bilirubin: 0.5 mg/dL (ref 0.3–1.2)
Total Protein: 7 g/dL (ref 6.5–8.1)

## 2017-11-16 LAB — CBC WITH DIFFERENTIAL/PLATELET
Basophils Absolute: 0 10*3/uL (ref 0.0–0.1)
Basophils Relative: 0 %
Eosinophils Absolute: 0.3 10*3/uL (ref 0.0–0.7)
Eosinophils Relative: 2 %
HCT: 39.8 % (ref 36.0–46.0)
Hemoglobin: 13.2 g/dL (ref 12.0–15.0)
Lymphocytes Relative: 38 %
Lymphs Abs: 4.7 10*3/uL — ABNORMAL HIGH (ref 0.7–4.0)
MCH: 29.1 pg (ref 26.0–34.0)
MCHC: 33.2 g/dL (ref 30.0–36.0)
MCV: 87.9 fL (ref 78.0–100.0)
Monocytes Absolute: 0.8 10*3/uL (ref 0.1–1.0)
Monocytes Relative: 7 %
Neutro Abs: 6.4 10*3/uL (ref 1.7–7.7)
Neutrophils Relative %: 53 %
Platelets: 426 10*3/uL — ABNORMAL HIGH (ref 150–400)
RBC: 4.53 MIL/uL (ref 3.87–5.11)
RDW: 13.9 % (ref 11.5–15.5)
WBC: 12.2 10*3/uL — ABNORMAL HIGH (ref 4.0–10.5)

## 2017-11-16 LAB — I-STAT TROPONIN, ED
Troponin i, poc: 0 ng/mL (ref 0.00–0.08)
Troponin i, poc: 0.01 ng/mL (ref 0.00–0.08)

## 2017-11-16 LAB — ETHANOL: Alcohol, Ethyl (B): <10 mg/dL (ref <10)

## 2017-11-16 LAB — I-STAT BETA HCG BLOOD, ED (MC, WL, AP ONLY): I-stat hCG, quantitative: <5 m[IU]/mL (ref <5)

## 2017-11-16 LAB — D-DIMER, QUANTITATIVE: D-Dimer, Quant: <0.27 ug/mL-FEU (ref 0.00–0.50)

## 2017-11-16 MED ORDER — OMEPRAZOLE 20 MG PO CPDR: 20.0000 mg | DELAYED_RELEASE_CAPSULE | Freq: Every day | ORAL | 0 refills | Status: DC

## 2017-11-16 MED ORDER — GI COCKTAIL ~~LOC~~
30.0000 mL | Freq: Once | ORAL | Status: AC
  Administered 2017-11-16: 30 mL via ORAL
  Filled 2017-11-16: qty 30

## 2017-11-16 NOTE — ED Provider Notes (Signed)
[facility] DEPT Provider Note   CSN: [id number] Arrival date & time: 11/15/17  2343     History   Chief Complaint Chief Complaint  Patient presents with  . Chest Pain    HPI Shawna Anderson is a 52 y.o. female.  HPI   Shawna Anderson is a 52 y.o. female, with a history of DM, GERD, and HTN, presenting to the ED with chest pain over the past 24 hours. Pain feels like stabbing, central chest, lasting 10-15 minutes, 10/10, nonradiating. Improved with NTG. Last episode around 9pm 5/5.  Accompanied by sweating and nausea. Occurs at rest, none with exertion. States she has been having chest pain on a weekly basis "ever since that stent got put in." She then states it has been daily for the last several days.  States she was vomiting yesterday and had periumbilical pain, but this pain resolved when the vomiting resolved. No vomiting or abdominal pain in last 24 hours. Occasional alcohol use. Twice a day marijuana use, last use around 3pm on 5/5. Denies acute shortness of breath, cough, fever/chills, diarrhea, hematochezia/melena, or any other complaints.   Does not have a cardiologist.   Past Medical History:  Diagnosis Date  . Asthma   . Bipolar 1 disorder ([place])   . Cervical herniated disc 04/12/2016  . Depression   . Diabetes mellitus without complication ([place])   . GERD (gastroesophageal reflux disease)   . Hypertension   . Sleep apnea     Patient Active Problem List   Diagnosis Date Noted  . CAD S/P CFX PCI 2010 04/15/2016  . Essential hypertension 04/15/2016  . Dyslipidemia 04/15/2016  . Bipolar 1 disorder ([place]) 04/15/2016  . Noncompliance with medication regimen 04/15/2016  . Chest pain with moderate risk of acute coronary syndrome 04/12/2016  . Abdominal pain 01/12/2015  . Vomiting 01/12/2015  . Nausea and vomiting 01/12/2015    Past Surgical History:  Procedure Laterality Date  . ABDOMINAL HYSTERECTOMY    . ABDOMINAL SURGERY   1995   Bowel resection.  Marland Kitchen CARDIAC CATHETERIZATION N/A 04/14/2016   Procedure: Left Heart Cath and Coronary Angiography;  Surgeon: Burnell Blanks, MD;  Location: [facility];  Service: Cardiovascular;  Laterality: N/A;  . CARDIAC SURGERY    . CORONARY ANGIOPLASTY WITH STENT PLACEMENT  2010   Drug eluting stent  . OVARIAN CYST REMOVAL       OB History   None      Home Medications    Prior to Admission medications   Medication Sig Start Date End Date Taking? Authorizing Provider  albuterol (PROVENTIL) (2.5 MG/3ML) 0.083% nebulizer solution Inhale 3 mLs 3 (three) times daily as needed into the lungs. 04/02/17   [provider]  aspirin EC 81 MG tablet Take 81 mg by mouth daily.    [provider]  ibuprofen (ADVIL,MOTRIN) 800 MG tablet Take 1 tablet (800 mg total) by mouth every 8 (eight) hours as needed for moderate pain. 04/11/17   Paulette Blanch, MD  labetalol (NORMODYNE) 100 MG tablet Take 1 tablet (100 mg total) by mouth 2 (two) times daily. 11/14/17   Delman Kitten, MD  omeprazole (PRILOSEC) 20 MG capsule Take 1 capsule (20 mg total) by mouth daily. 11/16/17 01/15/18  Traveion Ruddock, Helane Gunther, PA-C  promethazine (PHENERGAN) 25 MG suppository Place 1 suppository (25 mg total) rectally every 6 (six) hours as needed for nausea or vomiting. 11/14/17   Delman Kitten, MD  ranitidine (ZANTAC) 150  MG tablet Take 1 tablet (150 mg total) 2 (two) times daily by mouth. 05/26/17 05/26/18  Merlyn Lot, MD  sucralfate (CARAFATE) 1 g tablet Take 1 tablet (1 g total) 3 (three) times daily by mouth. 05/26/17 05/26/18  Merlyn Lot, MD  traZODone (DESYREL) 150 MG tablet Take 150 mg by mouth at bedtime.    [provider]    Family History Family History  Problem Relation Age of Onset  . CAD Mother   . CAD Brother     Social History Social History   Tobacco Use  . Smoking status: Current Every Day Smoker    Packs/day: 0.50    Years: 30.00    Pack years: 15.00     Types: Cigarettes  . Smokeless tobacco: Never Used  . Tobacco comment: refused  Substance Use Topics  . Alcohol use: Yes    Comment: occasional  . Drug use: Yes    Frequency: 7.0 times per week    Types: Marijuana     Allergies   Ativan [lorazepam]; Latex; Tape; and Drixoral allergy sinus [dexbromphen-pse-apap er]   Review of Systems Review of Systems  Constitutional: Positive for diaphoresis. Negative for chills and fever.  Respiratory: Negative for cough and shortness of breath.   Cardiovascular: Positive for chest pain. Negative for leg swelling.  Gastrointestinal: Positive for nausea. Negative for abdominal pain, blood in stool, diarrhea and vomiting.  Musculoskeletal: Negative for back pain.  Neurological: Negative for dizziness, syncope, light-headedness and headaches.  All other systems reviewed and are negative.    Physical Exam Updated Vital Signs BP 117/76 (BP Location: Left Arm)   Pulse 87   Temp 98.6 F (37 C) (Oral)   Resp 20   SpO2 95%   Physical Exam  Constitutional: She appears well-developed and well-nourished. No distress.  HENT:  Head: Normocephalic and atraumatic.  Eyes: Conjunctivae are normal.  Neck: Neck supple.  Cardiovascular: Normal rate, regular rhythm, normal heart sounds and intact distal pulses.  Pulmonary/Chest: Effort normal and breath sounds normal. No respiratory distress.  Abdominal: Soft. There is no tenderness. There is no guarding.  Musculoskeletal: She exhibits no edema.  Lymphadenopathy:    She has no cervical adenopathy.  Neurological: She is alert.  Skin: Skin is warm and dry. She is not diaphoretic.  Psychiatric: She has a normal mood and affect. Her behavior is normal.  Nursing note and vitals reviewed.    ED Treatments / Results  Labs (all labs ordered are listed, but only abnormal results are displayed) Labs Reviewed  COMPREHENSIVE METABOLIC PANEL - Abnormal; Notable for the following components:      Result  Value   Glucose, Bld 116 (*)    BUN 23 (*)    All other components within normal limits  CBC WITH DIFFERENTIAL/PLATELET - Abnormal; Notable for the following components:   WBC 12.2 (*)    Platelets 426 (*)    Lymphs Abs 4.7 (*)    All other components within normal limits  RAPID URINE DRUG SCREEN, HOSP PERFORMED - Abnormal; Notable for the following components:   Opiates POSITIVE (*)    Cocaine POSITIVE (*)    Barbiturates POSITIVE (*)    All other components within normal limits  URINALYSIS, ROUTINE W REFLEX MICROSCOPIC - Abnormal; Notable for the following components:   Color, Urine AMBER (*)    APPearance HAZY (*)    Specific Gravity, Urine 1.040 (*)    Bilirubin Urine MODERATE (*)    Ketones, ur 5 (*)  Protein, ur 100 (*)    Leukocytes, UA TRACE (*)    All other components within normal limits  LIPASE, BLOOD  ETHANOL  D-DIMER, QUANTITATIVE (NOT AT [facility])  I-STAT TROPONIN, ED  I-STAT BETA HCG BLOOD, ED ([facility], [facility], [facility] ONLY)  I-STAT TROPONIN, ED    EKG EKG Interpretation  Date/Time:  Sunday Nov 15 2017 23:50:00 EDT Ventricular Rate:  81 PR Interval:    QRS Duration: 80 QT Interval:  365 QTC Calculation: 424 R Axis:   16 Text Interpretation:  Sinus rhythm Confirmed by Dory Horn) on 11/16/2017 12:00:37 AM   Radiology Dg Chest 2 View  Result Date: 11/16/2017 CLINICAL DATA:  52 year old female with shortness of breath. EXAM: CHEST - 2 VIEW COMPARISON:  Chest radiograph dated 05/26/2017 FINDINGS: The heart size and mediastinal contours are within normal limits. Both lungs are clear. The visualized skeletal structures are unremarkable. IMPRESSION: No active cardiopulmonary disease. Electronically Signed   By: Anner Crete M.D.   On: 11/16/2017 01:12   Ct Head Wo Contrast  Result Date: 11/14/2017 CLINICAL DATA:  New onset headache with elevated BP EXAM: CT HEAD WITHOUT CONTRAST TECHNIQUE: Contiguous axial images were obtained from the base of the skull through  the vertex without intravenous contrast. COMPARISON:  03/12/2017 FINDINGS: Brain: No evidence of acute infarction, hemorrhage, hydrocephalus, extra-axial collection or mass lesion/mass effect. Vascular: No hyperdense vessels. Scattered calcifications at the carotid siphons. Skull: Normal. Negative for fracture or focal lesion. Sinuses/Orbits: No acute finding. Other: None IMPRESSION: Negative non contrasted CT appearance of the brain. Electronically Signed   By: Donavan Foil M.D.   On: 11/14/2017 18:05   Ct Abdomen Pelvis W Contrast  Result Date: 11/14/2017 CLINICAL DATA:  Nausea vomiting and mid abdominal pain. EXAM: CT ABDOMEN AND PELVIS WITH CONTRAST TECHNIQUE: Multidetector CT imaging of the abdomen and pelvis was performed using the standard protocol following bolus administration of intravenous contrast. CONTRAST:  37mL ISOVUE-370 IOPAMIDOL (ISOVUE-370) INJECTION 76% COMPARISON:  05/26/2017 FINDINGS: Lower chest: Small hiatal hernia. Hepatobiliary: No focal liver abnormality is seen. No gallstones, gallbladder wall thickening, or biliary dilatation. Pancreas: Unremarkable. No pancreatic ductal dilatation or surrounding inflammatory changes. Spleen: Normal in size without focal abnormality. Adrenals/Urinary Tract: Adrenal glands are unremarkable. Kidneys are normal, without renal calculi, focal lesion, or hydronephrosis. Bladder is unremarkable. Stomach/Bowel: Stomach is within normal limits. No evidence of bowel wall thickening, distention, or inflammatory changes. Enterotomy line the sigmoid colon, unchanged. Vascular/Lymphatic: Aortic atherosclerosis. No enlarged abdominal or pelvic lymph nodes. Reproductive: Status post hysterectomy. No adnexal masses. Other: No abdominal wall hernia or abnormality. No abdominopelvic ascites. Musculoskeletal: No acute or significant osseous findings. IMPRESSION: Small hiatal hernia. Otherwise no acute findings within the abdomen or pelvis. Electronically Signed   By:  Fidela Salisbury M.D.   On: 11/14/2017 17:07    Procedures Procedures (including critical care time)  Medications Ordered in ED Medications  gi cocktail (Maalox,Lidocaine,Donnatal) (30 mLs Oral Given 11/16/17 0327)     Initial Impression / Assessment and Plan / ED Course  I have reviewed the triage vital signs and the nursing notes.  Pertinent labs & imaging results that were available during my care of the patient were reviewed by me and considered in my medical decision making (see chart for details).     Patient presents with complaints of chest pain. Low suspicion for ACS. No high risk/suspicious features; no exertional chest pain, vomiting, diaphoresis, or radiation. HEART score is 1, indicating low risk for a cardiac event. [place]  criteria score is 0, indicating low risk for PE.  Doubt dissection: No widened mediastinum, no neuro deficits, no pulse deficit, d-dimer negative, and patient nontoxic appearing.  Delta troponins negative. Patient's use of cocaine and marijuana could be factors in her present complaint. Patient had no instances of pain or other symptoms throughout her ED course.  Return precautions discussed. Patient voices understanding of these instructions, accepts the plan, and is comfortable with discharge.  Findings and plan of care discussed with April Palumbo, MD.   Cardiac cath performed 04/14/16:   "Mid RCA lesion, 30 %stenosed.  Ost RPDA to RPDA lesion, 20 %stenosed.  Mid RCA to Dist RCA lesion, 10 %stenosed.  Ost Cx to Prox Cx lesion, 50 %stenosed.  Prox LAD to Mid LAD lesion, 20 %stenosed.  The left ventricular ejection fraction is greater than 65% by visual estimate.  The left ventricular systolic function is normal.  LV end diastolic pressure is normal.  There is no mitral valve regurgitation.   1. Single vessel CAD  2. Patent stent in the ostium of the Circumflex with moderate restenosis. This does not appear to be flow limiting. 3.  Mild non-obstructive disease in the RCA and LAD.  4. Normal LV function  Recommendation: Continue medical management of CAD."   Vitals:   11/15/17 2351 11/16/17 0312  BP: 117/76 127/84  Pulse: 87 72  Resp: 20 18  Temp: 98.6 F (37 C) 97.8 F (36.6 C)  TempSrc: Oral Oral  SpO2: 95% 98%     Final Clinical Impressions(s) / ED Diagnoses   Final diagnoses:  Atypical chest pain    ED Discharge Orders        Ordered    omeprazole (PRILOSEC) 20 MG capsule  Daily     11/16/17 0452       Lorayne Bender, PA-C 11/16/17 0500    Palumbo, April, MD 11/16/17 8416

## 2017-11-16 NOTE — ED Notes (Signed)
Patient transported to X-ray 

## 2017-11-16 NOTE — Discharge Instructions (Addendum)
Follow up with a primary care provider and/or cardiology. Begin taking the omeprazole daily 20 to 30 minutes prior to your first meal. Drug use is not recommended and can cause episodes of chest pain as well as nausea and vomiting. Return to the ED as needed.

## 2017-11-16 NOTE — ED Notes (Signed)
ED Provider at bedside. 

## 2017-12-29 ENCOUNTER — Ambulatory Visit (INDEPENDENT_AMBULATORY_CARE_PROVIDER_SITE_OTHER): Payer: Medicare Other | Admitting: Internal Medicine

## 2017-12-29 ENCOUNTER — Encounter: Payer: Self-pay | Admitting: Internal Medicine

## 2017-12-29 VITALS — BP 148/80 | HR 76 | Temp 98.6°F | Ht 63.0 in | Wt 196.2 lb

## 2017-12-29 DIAGNOSIS — J45909 Unspecified asthma, uncomplicated: Secondary | ICD-10-CM | POA: Insufficient documentation

## 2017-12-29 DIAGNOSIS — Z1211 Encounter for screening for malignant neoplasm of colon: Secondary | ICD-10-CM

## 2017-12-29 DIAGNOSIS — R739 Hyperglycemia, unspecified: Secondary | ICD-10-CM

## 2017-12-29 DIAGNOSIS — R112 Nausea with vomiting, unspecified: Secondary | ICD-10-CM

## 2017-12-29 DIAGNOSIS — M2550 Pain in unspecified joint: Secondary | ICD-10-CM

## 2017-12-29 DIAGNOSIS — K5900 Constipation, unspecified: Secondary | ICD-10-CM

## 2017-12-29 DIAGNOSIS — Z1329 Encounter for screening for other suspected endocrine disorder: Secondary | ICD-10-CM

## 2017-12-29 DIAGNOSIS — F319 Bipolar disorder, unspecified: Secondary | ICD-10-CM | POA: Diagnosis not present

## 2017-12-29 DIAGNOSIS — I25118 Atherosclerotic heart disease of native coronary artery with other forms of angina pectoris: Secondary | ICD-10-CM

## 2017-12-29 DIAGNOSIS — J449 Chronic obstructive pulmonary disease, unspecified: Secondary | ICD-10-CM

## 2017-12-29 DIAGNOSIS — G4733 Obstructive sleep apnea (adult) (pediatric): Secondary | ICD-10-CM

## 2017-12-29 DIAGNOSIS — L989 Disorder of the skin and subcutaneous tissue, unspecified: Secondary | ICD-10-CM | POA: Diagnosis not present

## 2017-12-29 DIAGNOSIS — I10 Essential (primary) hypertension: Secondary | ICD-10-CM

## 2017-12-29 DIAGNOSIS — G4700 Insomnia, unspecified: Secondary | ICD-10-CM | POA: Insufficient documentation

## 2017-12-29 DIAGNOSIS — J4520 Mild intermittent asthma, uncomplicated: Secondary | ICD-10-CM

## 2017-12-29 DIAGNOSIS — I2510 Atherosclerotic heart disease of native coronary artery without angina pectoris: Secondary | ICD-10-CM

## 2017-12-29 DIAGNOSIS — F419 Anxiety disorder, unspecified: Secondary | ICD-10-CM

## 2017-12-29 DIAGNOSIS — F329 Major depressive disorder, single episode, unspecified: Secondary | ICD-10-CM

## 2017-12-29 DIAGNOSIS — E785 Hyperlipidemia, unspecified: Secondary | ICD-10-CM

## 2017-12-29 DIAGNOSIS — G8929 Other chronic pain: Secondary | ICD-10-CM

## 2017-12-29 DIAGNOSIS — K219 Gastro-esophageal reflux disease without esophagitis: Secondary | ICD-10-CM

## 2017-12-29 DIAGNOSIS — M549 Dorsalgia, unspecified: Secondary | ICD-10-CM

## 2017-12-29 DIAGNOSIS — R109 Unspecified abdominal pain: Secondary | ICD-10-CM

## 2017-12-29 DIAGNOSIS — F32A Depression, unspecified: Secondary | ICD-10-CM | POA: Insufficient documentation

## 2017-12-29 DIAGNOSIS — Z9861 Coronary angioplasty status: Secondary | ICD-10-CM

## 2017-12-29 MED ORDER — CLOPIDOGREL BISULFATE 75 MG PO TABS: 75.0000 mg | ORAL_TABLET | Freq: Every day | ORAL | 3 refills | Status: DC

## 2017-12-29 MED ORDER — IPRATROPIUM-ALBUTEROL 0.5-2.5 (3) MG/3ML IN SOLN: 3.0000 mL | Freq: Four times a day (QID) | RESPIRATORY_TRACT | 11 refills | Status: DC | PRN

## 2017-12-29 MED ORDER — PROMETHAZINE HCL 25 MG RE SUPP: 25.0000 mg | Freq: Two times a day (BID) | RECTAL | 0 refills | Status: DC | PRN

## 2017-12-29 MED ORDER — METOPROLOL SUCCINATE ER 50 MG PO TB24: 50.0000 mg | ORAL_TABLET | Freq: Every day | ORAL | 1 refills | Status: DC

## 2017-12-29 MED ORDER — SENNOSIDES-DOCUSATE SODIUM 8.6-50 MG PO TABS: 1.0000 | ORAL_TABLET | Freq: Every day | ORAL | 1 refills | Status: DC | PRN

## 2017-12-29 MED ORDER — ALBUTEROL SULFATE HFA 108 (90 BASE) MCG/ACT IN AERS: 1.0000 | INHALATION_SPRAY | Freq: Four times a day (QID) | RESPIRATORY_TRACT | 11 refills | Status: DC | PRN

## 2017-12-29 MED ORDER — ASPIRIN EC 81 MG PO TBEC: 81.0000 mg | DELAYED_RELEASE_TABLET | Freq: Every day | ORAL | 3 refills | Status: DC

## 2017-12-29 MED ORDER — NITROGLYCERIN 0.4 MG SL SUBL: 0.4000 mg | SUBLINGUAL_TABLET | SUBLINGUAL | 2 refills | Status: DC | PRN

## 2017-12-29 MED ORDER — PANTOPRAZOLE SODIUM 40 MG PO TBEC
40.0000 mg | DELAYED_RELEASE_TABLET | Freq: Every day | ORAL | 1 refills | Status: DC
Start: 2017-12-29 — End: 2018-11-04

## 2017-12-29 MED ORDER — LISINOPRIL 10 MG PO TABS: 10.0000 mg | ORAL_TABLET | Freq: Every day | ORAL | 1 refills | Status: DC

## 2017-12-29 MED ORDER — ATORVASTATIN CALCIUM 40 MG PO TABS: 40.0000 mg | ORAL_TABLET | Freq: Every day | ORAL | 3 refills | Status: DC

## 2017-12-29 MED ORDER — TRAZODONE HCL 150 MG PO TABS: 150.0000 mg | ORAL_TABLET | Freq: Every day | ORAL | 2 refills | Status: DC

## 2017-12-29 NOTE — Patient Instructions (Addendum)
Senna/Colace for constipation  Increase water and fiber fruits/veggies  sch fasting labs x 12 hours  Refer to cardiology, GI, psychiatry and dermatology  Follow up in 6-8 weeks    Nonspecific Chest Pain Chest pain can be caused by many different conditions. There is always a chance that your pain could be related to something serious, such as a heart attack or a blood clot in your lungs. Chest pain can also be caused by conditions that are not life-threatening. If you have chest pain, it is very important to follow up with your health care provider. What are the causes? Causes of this condition include:  Heartburn.  Pneumonia or bronchitis.  Anxiety or stress.  Inflammation around your heart (pericarditis) or lung (pleuritis or pleurisy).  A blood clot in your lung.  A collapsed lung (pneumothorax). This can develop suddenly on its own (spontaneous pneumothorax) or from trauma to the chest.  Shingles infection (varicella-zoster virus).  Heart attack.  Damage to the bones, muscles, and cartilage that make up your chest wall. This can include: ? Bruised bones due to injury. ? Strained muscles or cartilage due to frequent or repeated coughing or overwork. ? Fracture to one or more ribs. ? Sore cartilage due to inflammation (costochondritis).  What increases the risk? Risk factors for this condition may include:  Activities that increase your risk for trauma or injury to your chest.  Respiratory infections or conditions that cause frequent coughing.  Medical conditions or overeating that can cause heartburn.  Heart disease or family history of heart disease.  Conditions or health behaviors that increase your risk of developing a blood clot.  Having had chicken pox (varicella zoster).  What are the signs or symptoms? Chest pain can feel like:  Burning or tingling on the surface of your chest or deep in your chest.  Crushing, pressure, aching, or squeezing  pain.  Dull or sharp pain that is worse when you move, cough, or take a deep breath.  Pain that is also felt in your back, neck, shoulder, or arm, or pain that spreads to any of these areas.  Your chest pain may come and go, or it may stay constant. How is this diagnosed? Lab tests or other studies may be needed to find the cause of your pain. Your health care provider may have you take a test called an ECG (electrocardiogram). An ECG records your heartbeat patterns at the time the test is performed. You may also have other tests, such as:  Transthoracic echocardiogram (TTE). In this test, sound waves are used to create a picture of the heart structures and to look at how blood flows through your heart.  Transesophageal echocardiogram (TEE).This is a more advanced imaging test that takes images from inside your body. It allows your health care provider to see your heart in finer detail.  Cardiac monitoring. This allows your health care provider to monitor your heart rate and rhythm in real time.  Holter monitor. This is a portable device that records your heartbeat and can help to diagnose abnormal heartbeats. It allows your health care provider to track your heart activity for several days, if needed.  Stress tests. These can be done through exercise or by taking medicine that makes your heart beat more quickly.  Blood tests.  Other imaging tests.  How is this treated? Treatment depends on what is causing your chest pain. Treatment may include:  Medicines. These may include: ? Acid blockers for heartburn. ? Anti-inflammatory medicine. ?  Pain medicine for inflammatory conditions. ? Antibiotic medicine, if an infection is present. ? Medicines to dissolve blood clots. ? Medicines to treat coronary artery disease (CAD).  Supportive care for conditions that do not require medicines. This may include: ? Resting. ? Applying heat or cold packs to injured areas. ? Limiting activities  until pain decreases.  Follow these instructions at home: Medicines  If you were prescribed an antibiotic, take it as told by your health care provider. Do not stop taking the antibiotic even if you start to feel better.  Take over-the-counter and prescription medicines only as told by your health care provider. Lifestyle  Do not use any products that contain nicotine or tobacco, such as cigarettes and e-cigarettes. If you need help quitting, ask your health care provider.  Do not drink alcohol.  Make lifestyle changes as directed by your health care provider. These may include: ? Getting regular exercise. Ask your health care provider to suggest some activities that are safe for you. ? Eating a heart-healthy diet. A registered dietitian can help you to learn healthy eating options. ? Maintaining a healthy weight. ? Managing diabetes, if necessary. ? Reducing stress, such as with yoga or relaxation techniques. General instructions  Avoid any activities that bring on chest pain.  If heartburn is the cause for your chest pain, raise (elevate) the head of your bed about 6 inches (15 cm) by putting blocks under the legs. Sleeping with more pillows does not effectively relieve heartburn because it only changes the position of your head.  Keep all follow-up visits as told by your health care provider. This is important. This includes any further testing if your chest pain does not go away. Contact a health care provider if:  Your chest pain does not go away.  You have a rash with blisters on your chest.  You have a fever.  You have chills. Get help right away if:  Your chest pain is worse.  You have a cough that gets worse, or you cough up blood.  You have severe pain in your abdomen.  You have severe weakness.  You faint.  You have sudden, unexplained chest discomfort.  You have sudden, unexplained discomfort in your arms, back, neck, or jaw.  You have shortness of  breath at any time.  You suddenly start to sweat, or your skin gets clammy.  You feel nauseous or you vomit.  You suddenly feel light-headed or dizzy.  Your heart begins to beat quickly, or it feels like it is skipping beats. These symptoms may represent a serious problem that is an emergency. Do not wait to see if the symptoms will go away. Get medical help right away. Call your local emergency services (911 in the [place] Do not drive yourself to the hospital. This information is not intended to replace advice given to you by your health care provider. Make sure you discuss any questions you have with your health care provider. Document Released: 04/09/2005 Document Revised: 03/24/2016 Document Reviewed: 03/24/2016 Elsevier Interactive Patient Education  2017 [employer]   Nausea and Vomiting, Adult Feeling sick to your stomach (nausea) means that your stomach is upset or you feel like you have to throw up (vomit). Feeling more and more sick to your stomach can lead to throwing up. Throwing up happens when food and liquid from your stomach are thrown up and out the mouth. Throwing up can make you feel weak and cause you to get dehydrated. Dehydration can make you  tired and thirsty, make you have a dry mouth, and make it so you pee (urinate) less often. Older adults and people with other diseases or a weak defense system (immune system) are at higher risk for dehydration. If you feel sick to your stomach or if you throw up, it is important to follow instructions from your doctor about how to take care of yourself. Follow these instructions at home: Eating and drinking Follow these instructions as told by your doctor:  Take an oral rehydration solution (ORS). This is a drink that is sold at pharmacies and stores.  Drink clear fluids in small amounts as you are able, such as: ? Water. ? Ice chips. ? Diluted fruit juice. ? Low-calorie sports drinks.  Eat bland, easy-to-digest foods in  small amounts as you are able, such as: ? Bananas. ? Applesauce. ? Rice. ? Low-fat (lean) meats. ? Toast. ? Crackers.  Avoid fluids that have a lot of sugar or caffeine in them.  Avoid alcohol.  Avoid spicy or fatty foods.  General instructions  Drink enough fluid to keep your pee (urine) clear or pale yellow.  Wash your hands often. If you cannot use soap and water, use hand sanitizer.  Make sure that all people in your home wash their hands well and often.  Take over-the-counter and prescription medicines only as told by your doctor.  Rest at home while you get better.  Watch your condition for any changes.  Breathe slowly and deeply when you feel sick to your stomach.  Keep all follow-up visits as told by your doctor. This is important. Contact a doctor if:  You have a fever.  You cannot keep fluids down.  Your symptoms get worse.  You have new symptoms.  You feel sick to your stomach for more than two days.  You feel light-headed or dizzy.  You have a headache.  You have muscle cramps. Get help right away if:  You have pain in your chest, neck, arm, or jaw.  You feel very weak or you pass out (faint).  You throw up again and again.  You see blood in your throw-up.  Your throw-up looks like black coffee grounds.  You have bloody or black poop (stools) or poop that look like tar.  You have a very bad headache, a stiff neck, or both.  You have a rash.  You have very bad pain, cramping, or bloating in your belly (abdomen).  You have trouble breathing.  You are breathing very quickly.  Your heart is beating very quickly.  Your skin feels cold and clammy.  You feel confused.  You have pain when you pee.  You have signs of dehydration, such as: ? Dark pee, hardly any pee, or no pee. ? Cracked lips. ? Dry mouth. ? Sunken eyes. ? Sleepiness. ? Weakness. These symptoms may be an emergency. Do not wait to see if the symptoms will go  away. Get medical help right away. Call your local emergency services (911 in the [place] Do not drive yourself to the hospital. This information is not intended to replace advice given to you by your health care provider. Make sure you discuss any questions you have with your health care provider. Document Released: 12/17/2007 Document Revised: 01/18/2016 Document Reviewed: 03/06/2015 Elsevier Interactive Patient Education  2018 [employer].   Constipation, Adult Constipation is when a person has fewer bowel movements in a week than normal, has difficulty having a bowel movement, or has stools that are  dry, hard, or larger than normal. Constipation may be caused by an underlying condition. It may become worse with age if a person takes certain medicines and does not take in enough fluids. Follow these instructions at home: Eating and drinking   Eat foods that have a lot of fiber, such as fresh fruits and vegetables, whole grains, and beans.  Limit foods that are high in fat, low in fiber, or overly processed, such as french fries, hamburgers, cookies, candies, and soda.  Drink enough fluid to keep your urine clear or pale yellow. General instructions  Exercise regularly or as told by your health care provider.  Go to the restroom when you have the urge to go. Do not hold it in.  Take over-the-counter and prescription medicines only as told by your health care provider. These include any fiber supplements.  Practice pelvic floor retraining exercises, such as deep breathing while relaxing the lower abdomen and pelvic floor relaxation during bowel movements.  Watch your condition for any changes.  Keep all follow-up visits as told by your health care provider. This is important. Contact a health care provider if:  You have pain that gets worse.  You have a fever.  You do not have a bowel movement after 4 days.  You vomit.  You are not hungry.  You lose weight.  You are  bleeding from the anus.  You have thin, pencil-like stools. Get help right away if:  You have a fever and your symptoms suddenly get worse.  You leak stool or have blood in your stool.  Your abdomen is bloated.  You have severe pain in your abdomen.  You feel dizzy or you faint. This information is not intended to replace advice given to you by your health care provider. Make sure you discuss any questions you have with your health care provider. Document Released: 03/28/2004 Document Revised: 01/18/2016 Document Reviewed: 12/19/2015 Elsevier Interactive Patient Education  2018 [employer].

## 2017-12-29 NOTE — Progress Notes (Signed)
Pre visit review using our clinic review tool, if applicable. No additional management support is needed unless otherwise documented below in the visit note. 

## 2017-12-30 ENCOUNTER — Telehealth: Payer: Self-pay | Admitting: Radiology

## 2017-12-30 ENCOUNTER — Encounter: Payer: Self-pay | Admitting: Internal Medicine

## 2017-12-30 DIAGNOSIS — I25118 Atherosclerotic heart disease of native coronary artery with other forms of angina pectoris: Secondary | ICD-10-CM | POA: Insufficient documentation

## 2017-12-30 DIAGNOSIS — F319 Bipolar disorder, unspecified: Secondary | ICD-10-CM | POA: Insufficient documentation

## 2017-12-30 DIAGNOSIS — K5900 Constipation, unspecified: Secondary | ICD-10-CM | POA: Insufficient documentation

## 2017-12-30 DIAGNOSIS — G4733 Obstructive sleep apnea (adult) (pediatric): Secondary | ICD-10-CM | POA: Insufficient documentation

## 2017-12-30 DIAGNOSIS — K219 Gastro-esophageal reflux disease without esophagitis: Secondary | ICD-10-CM | POA: Insufficient documentation

## 2017-12-30 NOTE — Progress Notes (Addendum)
Chief Complaint  Patient presents with  . New Patient (Initial Visit)   New patient moved here 4 years ago from [place]  here with "nephew Ena Dawley" who is not blood relative. Chronic medical problems and needs referrals reports source of income limited has medicare but not yet able to get medicare in [place]  1. C/o Chest pain intermittently h/o CAD s/p BMS OM Cx. She needs refills on all medications and has not been taking Plavix 75 mg qd, needs refill of prn NTG which she has been using. Also needs refill on lipitor 2. HTN/HLD BP sl elevated today she would like refill of Toprol 50 was taking tartrate bid will change to XL 50 mg qd, Lis 10 mg qd 3. Asthma vs COPD needs refills of neb and also proair inhaler she reports she was on another inhaler in the past but unsure the name  4. C/o constipation, abdominal pain and chronic n/v. She does smoke THC regularly this could be cause of chronic nausea. She would like refill of phenerghan suppository prn and protonix 40 mg qd.  5. H/o bipolar, depression/anxiety,insomnia she was on zoloft 50 mg qd, Depakote ER 500 mg, Remeron 45 mg qhs and cymbalta 30 mg all at one time per pt from Psych in [place]. She has also been on Trazadone 150 mg qhs for sleep.  I advised pt due to history she needs to f/u with psy. And for sleep for now will restart trazadone 150 mg qhs  6. H/o OSA not using CPAP though has machine and approaching ?5 years since last sleep study 7. C/o chronic pain in back and other joints. H/o herniated disc in lower back   Review of Systems  Constitutional: Negative for weight loss.  HENT: Negative for hearing loss.   Eyes: Negative for blurred vision.  Respiratory: Negative for shortness of breath.   Cardiovascular: Negative for chest pain.  Gastrointestinal: Positive for abdominal pain, constipation, nausea and vomiting.  Musculoskeletal: Positive for back pain and joint pain.  Skin: Negative for rash.  Neurological: Negative for headaches.    Psychiatric/Behavioral: Positive for depression. The patient is nervous/anxious and has insomnia.        +stress   Past Medical History:  Diagnosis Date  . Asthma   . Bipolar 1 disorder ([place])   . Cervical herniated disc 04/12/2016  . Depression   . Diabetes mellitus without complication ([place])   . GERD (gastroesophageal reflux disease)   . Hypertension   . Sleep apnea    Past Surgical History:  Procedure Laterality Date  . ABDOMINAL HYSTERECTOMY    . ABDOMINAL SURGERY  1995   Bowel resection.  Marland Kitchen CARDIAC CATHETERIZATION N/A 04/14/2016   Procedure: Left Heart Cath and Coronary Angiography;  Surgeon: Burnell Blanks, MD;  Location: [facility];  Service: Cardiovascular;  Laterality: N/A;  . CARDIAC SURGERY    . CORONARY ANGIOPLASTY WITH STENT PLACEMENT  2010   Drug eluting stent  . OVARIAN CYST REMOVAL     Family History  Problem Relation Age of Onset  . CAD Mother   . CAD Brother    Social History   Socioeconomic History  . Marital status: Single    Spouse name: Not on file  . Number of children: Not on file  . Years of education: Not on file  . Highest education level: Not on file  Occupational History  . Not on file  Social Needs  . Financial resource strain: Not on file  .  Food insecurity:    Worry: Not on file    Inability: Not on file  . Transportation needs:    Medical: Not on file    Non-medical: Not on file  Tobacco Use  . Smoking status: Current Every Day Smoker    Packs/day: 0.50    Years: 30.00    Pack years: 15.00    Types: Cigarettes  . Smokeless tobacco: Never Used  . Tobacco comment: refused  Substance and Sexual Activity  . Alcohol use: Yes    Comment: occasional  . Drug use: Yes    Frequency: 7.0 times per week    Types: Marijuana  . Sexual activity: Not Currently  Lifestyle  . Physical activity:    Days per week: Not on file    Minutes per session: Not on file  . Stress: Not on file  Relationships  . Social  connections:    Talks on phone: Not on file    Gets together: Not on file    Attends religious service: Not on file    Active member of club or organization: Not on file    Attends meetings of clubs or organizations: Not on file    Relationship status: Not on file  . Intimate partner violence:    Fear of current or ex partner: Not on file    Emotionally abused: Not on file    Physically abused: Not on file    Forced sexual activity: Not on file  Other Topics Concern  . Not on file  Social History Narrative  . Not on file   Current Meds  Medication Sig  . aspirin EC 81 MG tablet Take 1 tablet (81 mg total) by mouth daily.  Marland Kitchen atorvastatin (LIPITOR) 40 MG tablet Take 1 tablet (40 mg total) by mouth daily at 6 PM.  . clopidogrel (PLAVIX) 75 MG tablet Take 1 tablet (75 mg total) by mouth daily.  . DULoxetine (CYMBALTA) 30 MG capsule Take 30 mg by mouth daily.  Marland Kitchen lisinopril (PRINIVIL,ZESTRIL) 10 MG tablet Take 1 tablet (10 mg total) by mouth daily.  . metoprolol succinate (TOPROL-XL) 50 MG 24 hr tablet Take 1 tablet (50 mg total) by mouth daily. Take with or immediately following a meal.  . mirtazapine (REMERON) 45 MG tablet Take 45 mg by mouth at bedtime.  . pantoprazole (PROTONIX) 40 MG tablet Take 1 tablet (40 mg total) by mouth daily. In am before food  . promethazine (PHENERGAN) 25 MG suppository Place 1 suppository (25 mg total) rectally 2 (two) times daily as needed for nausea or vomiting.  . sertraline (ZOLOFT) 50 MG tablet Take 50 mg by mouth daily.  . traZODone (DESYREL) 150 MG tablet Take 1 tablet (150 mg total) by mouth at bedtime.  . [DISCONTINUED] albuterol (PROVENTIL) (2.5 MG/3ML) 0.083% nebulizer solution Inhale 3 mLs 3 (three) times daily as needed into the lungs.  . [DISCONTINUED] aspirin EC 81 MG tablet Take 81 mg by mouth daily.  . [DISCONTINUED] atorvastatin (LIPITOR) 40 MG tablet Take 40 mg by mouth daily.  . [DISCONTINUED] clopidogrel (PLAVIX) 75 MG tablet Take 75 mg  by mouth daily.  . [DISCONTINUED] ibuprofen (ADVIL,MOTRIN) 800 MG tablet Take 1 tablet (800 mg total) by mouth every 8 (eight) hours as needed for moderate pain.  . [DISCONTINUED] labetalol (NORMODYNE) 100 MG tablet Take 1 tablet (100 mg total) by mouth 2 (two) times daily.  . [DISCONTINUED] lisinopril (PRINIVIL,ZESTRIL) 10 MG tablet Take 20 mg by mouth daily.  . [DISCONTINUED] metoprolol  succinate (TOPROL-XL) 50 MG 24 hr tablet Take 50 mg by mouth daily. Take with or immediately following a meal.  . [DISCONTINUED] nitroGLYCERIN (NITRODUR - DOSED IN MG/24 HR) 0.4 mg/hr patch Place 0.4 mg onto the skin daily.  . [DISCONTINUED] omeprazole (PRILOSEC) 20 MG capsule Take 1 capsule (20 mg total) by mouth daily.  . [DISCONTINUED] pantoprazole (PROTONIX) 40 MG tablet Take 40 mg by mouth daily.  . [DISCONTINUED] promethazine (PHENERGAN) 25 MG suppository Place 1 suppository (25 mg total) rectally every 6 (six) hours as needed for nausea or vomiting.  . [DISCONTINUED] ranitidine (ZANTAC) 150 MG tablet Take 1 tablet (150 mg total) 2 (two) times daily by mouth.  . [DISCONTINUED] traZODone (DESYREL) 150 MG tablet Take 150 mg by mouth at bedtime.   Allergies  Allergen Reactions  . Ativan [Lorazepam]     Pt states it makes her tongue do weird things   . Latex Other (See Comments)    Patient stated that she was told by her doctor that she is "allergic to" this  . Tape Other (See Comments)    Patient stated that she was told by her doctor that she is "allergic to" this  . Drixoral Allergy Sinus [Dexbromphen-Pse-Apap Er] Rash   Recent Results (from the past 2160 hour(s))  Lipase, blood     Status: None   Collection Time: 11/14/17  2:46 PM  Result Value Ref Range   Lipase 26 11 - 51 U/L    Comment: Performed at [facility], [address] [place], [place] [address]  Comprehensive metabolic panel     Status: Abnormal   Collection Time: 11/14/17  2:46 PM  Result Value Ref Range   Sodium 140 135  - 145 mmol/L   Potassium 3.9 3.5 - 5.1 mmol/L   Chloride 104 101 - 111 mmol/L   CO2 25 22 - 32 mmol/L   Glucose, Bld 158 (H) 65 - 99 mg/dL   BUN 13 6 - 20 mg/dL   Creatinine, Ser 0.69 0.44 - 1.00 mg/dL   Calcium 9.7 8.9 - 10.3 mg/dL   Total Protein 8.7 (H) 6.5 - 8.1 g/dL   Albumin 4.8 3.5 - 5.0 g/dL   AST 31 15 - 41 U/L   ALT 41 14 - 54 U/L   Alkaline Phosphatase 116 38 - 126 U/L   Total Bilirubin 0.7 0.3 - 1.2 mg/dL   GFR calc non Af Amer >60 >60 mL/min   GFR calc Af Amer >60 >60 mL/min    Comment: (NOTE) The eGFR has been calculated using the CKD EPI equation. This calculation has not been validated in all clinical situations. eGFR's persistently <60 mL/min signify possible Chronic Kidney Disease.    Anion gap 11 5 - 15    Comment: Performed at [facility], [address] [place], [place] [address]  CBC     Status: Abnormal   Collection Time: 11/14/17  2:46 PM  Result Value Ref Range   WBC 20.0 (H) 3.6 - 11.0 K/uL   RBC 5.43 (H) 3.80 - 5.20 MIL/uL   Hemoglobin 15.5 12.0 - 16.0 g/dL   HCT 46.4 35.0 - 47.0 %   MCV 85.3 80.0 - 100.0 fL   MCH 28.5 26.0 - 34.0 pg   MCHC 33.3 32.0 - 36.0 g/dL   RDW 14.3 11.5 - 14.5 %   Platelets 544 (H) 150 - 440 K/uL    Comment: Performed at [facility], [address], [place], [place] [address]  Troponin I     Status: None   Collection Time: 11/14/17  2:56 PM  Result Value Ref Range   Troponin I <0.03 <0.03 ng/mL    Comment: Performed at [facility], [address] [place], [place] [address]  Comprehensive metabolic panel     Status: Abnormal   Collection Time: 11/16/17 12:44 AM  Result Value Ref Range   Sodium 136 135 - 145 mmol/L   Potassium 3.7 3.5 - 5.1 mmol/L   Chloride 103 101 - 111 mmol/L   CO2 25 22 - 32 mmol/L   Glucose, Bld 116 (H) 65 - 99 mg/dL   BUN 23 (H) 6 - 20 mg/dL   Creatinine, Ser 0.89 0.44 - 1.00 mg/dL   Calcium 8.9 8.9 - 10.3 mg/dL   Total Protein 7.0 6.5 - 8.1 g/dL   Albumin  3.7 3.5 - 5.0 g/dL   AST 33 15 - 41 U/L   ALT 42 14 - 54 U/L   Alkaline Phosphatase 98 38 - 126 U/L   Total Bilirubin 0.5 0.3 - 1.2 mg/dL   GFR calc non Af Amer >60 >60 mL/min   GFR calc Af Amer >60 >60 mL/min    Comment: (NOTE) The eGFR has been calculated using the CKD EPI equation. This calculation has not been validated in all clinical situations. eGFR's persistently <60 mL/min signify possible Chronic Kidney Disease.    Anion gap 8 5 - 15    Comment: Performed at [facility], [place] [address], [place], [place] [address]  CBC with Differential     Status: Abnormal   Collection Time: 11/16/17 12:44 AM  Result Value Ref Range   WBC 12.2 (H) 4.0 - 10.5 K/uL   RBC 4.53 3.87 - 5.11 MIL/uL   Hemoglobin 13.2 12.0 - 15.0 g/dL   HCT 39.8 36.0 - 46.0 %   MCV 87.9 78.0 - 100.0 fL   MCH 29.1 26.0 - 34.0 pg   MCHC 33.2 30.0 - 36.0 g/dL   RDW 13.9 11.5 - 15.5 %   Platelets 426 (H) 150 - 400 K/uL   Neutrophils Relative % 53 %   Neutro Abs 6.4 1.7 - 7.7 K/uL   Lymphocytes Relative 38 %   Lymphs Abs 4.7 (H) 0.7 - 4.0 K/uL   Monocytes Relative 7 %   Monocytes Absolute 0.8 0.1 - 1.0 K/uL   Eosinophils Relative 2 %   Eosinophils Absolute 0.3 0.0 - 0.7 K/uL   Basophils Relative 0 %   Basophils Absolute 0.0 0.0 - 0.1 K/uL    Comment: Performed at [facility], [place] [address] [place], [place] [address]  Lipase, blood     Status: None   Collection Time: 11/16/17 12:44 AM  Result Value Ref Range   Lipase 40 11 - 51 U/L    Comment: Performed at [facility], [place] [address] [place], [place] [address]  Ethanol     Status: None   Collection Time: 11/16/17 12:44 AM  Result Value Ref Range   Alcohol, Ethyl (B) <10 <10 mg/dL    Comment:        LOWEST DETECTABLE LIMIT FOR SERUM ALCOHOL IS 10 mg/dL FOR MEDICAL PURPOSES ONLY Performed at [facility] [address] [place], [place] [address]   Urine rapid drug screen  (hosp performed)     Status: Abnormal   Collection Time: 11/16/17 12:44 AM  Result Value Ref Range   Opiates POSITIVE (A) NONE DETECTED  Cocaine POSITIVE (A) NONE DETECTED   Benzodiazepines NONE DETECTED NONE DETECTED   Amphetamines NONE DETECTED NONE DETECTED   Tetrahydrocannabinol NONE DETECTED NONE DETECTED   Barbiturates POSITIVE (A) NONE DETECTED    Comment: (NOTE) DRUG SCREEN FOR MEDICAL PURPOSES ONLY.  IF CONFIRMATION IS NEEDED FOR ANY PURPOSE, NOTIFY LAB WITHIN 5 DAYS. LOWEST DETECTABLE LIMITS FOR URINE DRUG SCREEN Drug Class                     Cutoff (ng/mL) Amphetamine and metabolites    1000 Barbiturate and metabolites    200 Benzodiazepine                 814 Tricyclics and metabolites     300 Opiates and metabolites        300 Cocaine and metabolites        300 THC                            50 Performed at [facility], [place] [address], [place], [place] [address]   Urinalysis, Routine w reflex microscopic     Status: Abnormal   Collection Time: 11/16/17 12:44 AM  Result Value Ref Range   Color, Urine AMBER (A) YELLOW    Comment: BIOCHEMICALS MAY BE AFFECTED BY COLOR   APPearance HAZY (A) CLEAR   Specific Gravity, Urine 1.040 (H) 1.005 - 1.030   pH 5.0 5.0 - 8.0   Glucose, UA NEGATIVE NEGATIVE mg/dL   Hgb urine dipstick NEGATIVE NEGATIVE   Bilirubin Urine MODERATE (A) NEGATIVE   Ketones, ur 5 (A) NEGATIVE mg/dL   Protein, ur 100 (A) NEGATIVE mg/dL   Nitrite NEGATIVE NEGATIVE   Leukocytes, UA TRACE (A) NEGATIVE   RBC / HPF 0-5 0 - 5 RBC/hpf   WBC, UA 6-10 0 - 5 WBC/hpf   Bacteria, UA NONE SEEN NONE SEEN   Squamous Epithelial / LPF 0-5 0 - 5    Comment: Please note change in reference range.   Mucus PRESENT    Ca Oxalate Crys, UA PRESENT     Comment: Performed at [facility], [place] [address] [place], [place] [address]  I-stat troponin, ED     Status: None   Collection Time: 11/16/17 12:54 AM  Result Value Ref Range    Troponin i, poc 0.00 0.00 - 0.08 ng/mL   Comment 3            Comment: Due to the release kinetics of cTnI, a negative result within the first hours of the onset of symptoms does not rule out myocardial infarction with certainty. If myocardial infarction is still suspected, repeat the test at appropriate intervals.   I-Stat beta hCG blood, ED     Status: None   Collection Time: 11/16/17 12:55 AM  Result Value Ref Range   I-stat hCG, quantitative <5.0 <5 mIU/mL   Comment 3            Comment:   GEST. AGE      CONC.  (mIU/mL)   <=1 WEEK        5 - 50     2 WEEKS       50 - 500     3 WEEKS       100 - 10,000     4 WEEKS     1,000 - 30,000        FEMALE AND NON-PREGNANT FEMALE:  LESS THAN 5 mIU/mL   D-dimer, quantitative     Status: None   Collection Time: 11/16/17  2:03 AM  Result Value Ref Range   D-Dimer, Quant <0.27 0.00 - 0.50 ug/mL-FEU    Comment: (NOTE) At the manufacturer cut-off of 0.50 ug/mL FEU, this assay has been documented to exclude PE with a sensitivity and negative predictive value of 97 to 99%.  At this time, this assay has not been approved by the FDA to exclude DVT/VTE. Results should be correlated with clinical presentation. Performed at [facility], [place] [address] [place], [place] [address]   I-stat troponin, ED     Status: None   Collection Time: 11/16/17  3:52 AM  Result Value Ref Range   Troponin i, poc 0.01 0.00 - 0.08 ng/mL   Comment 3            Comment: Due to the release kinetics of cTnI, a negative result within the first hours of the onset of symptoms does not rule out myocardial infarction with certainty. If myocardial infarction is still suspected, repeat the test at appropriate intervals.    Objective  Body mass index is 34.76 kg/m. Wt Readings from Last 3 Encounters:  12/29/17 196 lb 3.2 oz (89 kg)  11/14/17 190 lb (86.2 kg)  05/26/17 185 lb (83.9 kg)   Temp Readings from Last 3 Encounters:  12/29/17  98.6 F (37 C) (Oral)  11/16/17 97.8 F (36.6 C) (Oral)  11/14/17 97.7 F (36.5 C) (Axillary)   BP Readings from Last 3 Encounters:  12/29/17 (!) 148/80  11/16/17 122/86  11/14/17 (!) 150/84   Pulse Readings from Last 3 Encounters:  12/29/17 76  11/16/17 78  11/14/17 98    Physical Exam  Constitutional: She is oriented to person, place, and time. She appears well-developed and well-nourished. She is cooperative.    HENT:  Head: Normocephalic and atraumatic.  Mouth/Throat: Oropharynx is clear and moist and mucous membranes are normal.  Eyes: Pupils are equal, round, and reactive to light. Conjunctivae are normal.  Cardiovascular: Normal rate, regular rhythm and normal heart sounds.  Pulmonary/Chest: Effort normal and breath sounds normal.  Neurological: She is alert and oriented to person, place, and time. Gait normal.  Skin: Skin is warm, dry and intact. Lesion noted.     Psychiatric: She has a normal mood and affect. Her speech is normal and behavior is normal. Judgment and thought content normal. Cognition and memory are normal.  Nursing note and vitals reviewed.   Assessment   1. HTN/HLD  2. H/o CAD (mild RCA, LAD) s/p BMS in OM Cx with angina. Per pt significant FH heart dz on maternal and paternal side  -last cath 04/14/16  3. H/o asthma vs copd with tobacco/THC abuse  4. Bipolar/depression/anxiety/insomnia/stress  5. OSA not using cpap  6. Chronic abdominal pain nausea and vomiting, constipation, h/o diverticulitis s/p bowel resection/enterotomy line sigmoid colon, h/o small hiatal hernia  -Chronic THC use, UDS + 11/16/17 barbituates, opiates and cocaine though pt declines and thinks UDS was wrong  7.Chronic back pain with h/o herniated disc and arthralgia  8.right back black skin lesion  9. HM Plan   1. Refilled medications  Recheck at f/u change metoprolol 50 mg bid to toprol xl 50 mg qd  2. Refer to cardiology f/u  Prn ntg  3. rec smoking cessation    Refilled inhalers  4. Referred to psychiatry  Add back trazadone 150 mg qhs sleep  5. Consider  repeat sleep study in future  6. Refer to GI possible EGD/colonoscopy  Trial of senna S disc miralax as well  7. Check labs w/u arthritis  8. Refer to dermatology  9.  Declines flu shot  Tdap likely due pt unsure if had 6-7 years ago  Consider pna 23, shingrix and in future if has not had   S/p hysterectomy will ask at f/u if had abnormal pap  Referred to GI need for colonoscopy and possibly EGD with issues. H/o sigmoid resection in past for h/o diverticulitis  Will refer mammogram at f/u   rec smoking cessation smoking < or = 0.5 ppd also using THC  "I spent 45 minutes or greater minutes face-to face with patient with greater than 50% of time spent counseling and/or in coordination of care as above   Other doctors  Cardiology Dr. Annamary Carolin [address] [place] [address] fax [phone]  PCP D.r Westley Chandler [address] [place] [address] [phone] phone #    Provider: Dr. Olivia Mackie McLean-Scocuzza-Internal Medicine

## 2017-12-30 NOTE — Telephone Encounter (Signed)
Pt coming in for labs tomorrow, please place future orders. Thank you.  

## 2017-12-31 ENCOUNTER — Other Ambulatory Visit: Payer: Medicare Other

## 2017-12-31 DIAGNOSIS — I10 Essential (primary) hypertension: Secondary | ICD-10-CM

## 2017-12-31 DIAGNOSIS — M2550 Pain in unspecified joint: Secondary | ICD-10-CM | POA: Diagnosis not present

## 2017-12-31 DIAGNOSIS — I25118 Atherosclerotic heart disease of native coronary artery with other forms of angina pectoris: Secondary | ICD-10-CM

## 2017-12-31 DIAGNOSIS — R739 Hyperglycemia, unspecified: Secondary | ICD-10-CM

## 2017-12-31 DIAGNOSIS — Z1329 Encounter for screening for other suspected endocrine disorder: Secondary | ICD-10-CM | POA: Diagnosis not present

## 2017-12-31 NOTE — Addendum Note (Signed)
Addended by: Arby Barrette on: 12/31/2017 09:05 AM   Modules accepted: Orders

## 2017-12-31 NOTE — Addendum Note (Signed)
Addended by: Orland Mustard on: 12/31/2017 06:32 PM   Modules accepted: Level of Service

## 2018-01-01 LAB — URINALYSIS, ROUTINE W REFLEX MICROSCOPIC
Bilirubin, UA: NEGATIVE
Glucose, UA: NEGATIVE
Ketones, UA: NEGATIVE
Nitrite, UA: NEGATIVE
Protein, UA: NEGATIVE
Specific Gravity, UA: 1.016 (ref 1.005–1.030)
Urobilinogen, Ur: 0.2 mg/dL (ref 0.2–1.0)
pH, UA: 5.5 (ref 5.0–7.5)

## 2018-01-01 LAB — MICROSCOPIC EXAMINATION
Bacteria, UA: NONE SEEN
Casts: NONE SEEN /lpf

## 2018-01-04 LAB — CBC WITH DIFFERENTIAL/PLATELET
Basophils Absolute: 85 cells/uL (ref 0–200)
Basophils Relative: 0.7 %
Eosinophils Absolute: 390 cells/uL (ref 15–500)
Eosinophils Relative: 3.2 %
HCT: 37.9 % (ref 35.0–45.0)
Hemoglobin: 12.9 g/dL (ref 11.7–15.5)
Lymphs Abs: 3099 cells/uL (ref 850–3900)
MCH: 28.4 pg (ref 27.0–33.0)
MCHC: 34 g/dL (ref 32.0–36.0)
MCV: 83.5 fL (ref 80.0–100.0)
MPV: 11.2 fL (ref 7.5–12.5)
Monocytes Relative: 7.3 %
Neutro Abs: 7735 cells/uL (ref 1500–7800)
Neutrophils Relative %: 63.4 %
Platelets: 451 10*3/uL — ABNORMAL HIGH (ref 140–400)
RBC: 4.54 10*6/uL (ref 3.80–5.10)
RDW: 13.7 % (ref 11.0–15.0)
Total Lymphocyte: 25.4 %
WBC mixed population: 891 cells/uL (ref 200–950)
WBC: 12.2 10*3/uL — ABNORMAL HIGH (ref 3.8–10.8)

## 2018-01-04 LAB — LIPID PANEL
Cholesterol: 279 mg/dL — ABNORMAL HIGH (ref <200)
HDL: 44 mg/dL — ABNORMAL LOW (ref >50)
LDL Cholesterol (Calc): 193 mg/dL (calc) — ABNORMAL HIGH
Non-HDL Cholesterol (Calc): 235 mg/dL (calc) — ABNORMAL HIGH (ref <130)
Total CHOL/HDL Ratio: 6.3 (calc) — ABNORMAL HIGH (ref <5.0)
Triglycerides: 220 mg/dL — ABNORMAL HIGH (ref <150)

## 2018-01-04 LAB — BASIC METABOLIC PANEL
BUN: 13 mg/dL (ref 7–25)
CO2: 21 mmol/L (ref 20–32)
Calcium: 8.9 mg/dL (ref 8.6–10.4)
Chloride: 108 mmol/L (ref 98–110)
Creat: 0.74 mg/dL (ref 0.50–1.05)
Glucose, Bld: 111 mg/dL — ABNORMAL HIGH (ref 65–99)
Potassium: 4.8 mmol/L (ref 3.5–5.3)
Sodium: 139 mmol/L (ref 135–146)

## 2018-01-04 LAB — ANA: Anti Nuclear Antibody(ANA): NEGATIVE

## 2018-01-04 LAB — HEMOGLOBIN A1C
Hgb A1c MFr Bld: 5.9 % of total Hgb — ABNORMAL HIGH (ref <5.7)
Mean Plasma Glucose: 123 (calc)
eAG (mmol/L): 6.8 (calc)

## 2018-01-04 LAB — C-REACTIVE PROTEIN: CRP: 9.4 mg/L — ABNORMAL HIGH (ref <8.0)

## 2018-01-04 LAB — SEDIMENTATION RATE: Sed Rate: 31 mm/h — ABNORMAL HIGH (ref 0–20)

## 2018-01-04 LAB — T4, FREE: Free T4: 1.2 ng/dL (ref 0.8–1.8)

## 2018-01-04 LAB — RHEUMATOID FACTOR: Rhuematoid fact SerPl-aCnc: <14 IU/mL (ref <14)

## 2018-01-04 LAB — CYCLIC CITRUL PEPTIDE ANTIBODY, IGG: Cyclic Citrullin Peptide Ab: <16 UNITS

## 2018-01-04 LAB — TSH: TSH: 2.16 mIU/L

## 2018-01-29 ENCOUNTER — Other Ambulatory Visit: Payer: Self-pay | Admitting: Internal Medicine

## 2018-01-29 DIAGNOSIS — G4700 Insomnia, unspecified: Secondary | ICD-10-CM

## 2018-01-29 MED ORDER — TRAZODONE HCL 150 MG PO TABS: 150.0000 mg | ORAL_TABLET | Freq: Every day | ORAL | 0 refills | Status: DC

## 2018-02-09 ENCOUNTER — Ambulatory Visit: Payer: Medicare Other | Admitting: Gastroenterology

## 2018-02-22 ENCOUNTER — Ambulatory Visit: Payer: Medicare Other | Admitting: Gastroenterology

## 2018-03-10 ENCOUNTER — Ambulatory Visit: Payer: Medicare Other | Admitting: Internal Medicine

## 2018-03-10 DIAGNOSIS — Z0289 Encounter for other administrative examinations: Secondary | ICD-10-CM

## 2018-03-31 ENCOUNTER — Encounter: Payer: Self-pay | Admitting: Gastroenterology

## 2018-03-31 ENCOUNTER — Ambulatory Visit: Payer: Medicare Other | Admitting: Gastroenterology

## 2018-05-18 ENCOUNTER — Ambulatory Visit: Payer: Medicare Other | Admitting: Cardiovascular Disease

## 2018-11-04 ENCOUNTER — Encounter: Payer: Self-pay | Admitting: Internal Medicine

## 2018-11-04 ENCOUNTER — Ambulatory Visit (INDEPENDENT_AMBULATORY_CARE_PROVIDER_SITE_OTHER): Payer: Medicare Other | Admitting: Internal Medicine

## 2018-11-04 DIAGNOSIS — Z1231 Encounter for screening mammogram for malignant neoplasm of breast: Secondary | ICD-10-CM

## 2018-11-04 DIAGNOSIS — I25118 Atherosclerotic heart disease of native coronary artery with other forms of angina pectoris: Secondary | ICD-10-CM | POA: Diagnosis not present

## 2018-11-04 DIAGNOSIS — K219 Gastro-esophageal reflux disease without esophagitis: Secondary | ICD-10-CM | POA: Diagnosis not present

## 2018-11-04 DIAGNOSIS — E559 Vitamin D deficiency, unspecified: Secondary | ICD-10-CM

## 2018-11-04 DIAGNOSIS — F419 Anxiety disorder, unspecified: Secondary | ICD-10-CM

## 2018-11-04 DIAGNOSIS — Z1329 Encounter for screening for other suspected endocrine disorder: Secondary | ICD-10-CM

## 2018-11-04 DIAGNOSIS — I10 Essential (primary) hypertension: Secondary | ICD-10-CM

## 2018-11-04 DIAGNOSIS — Z13818 Encounter for screening for other digestive system disorders: Secondary | ICD-10-CM

## 2018-11-04 DIAGNOSIS — G4700 Insomnia, unspecified: Secondary | ICD-10-CM

## 2018-11-04 DIAGNOSIS — R112 Nausea with vomiting, unspecified: Secondary | ICD-10-CM

## 2018-11-04 DIAGNOSIS — Z1159 Encounter for screening for other viral diseases: Secondary | ICD-10-CM

## 2018-11-04 DIAGNOSIS — Z113 Encounter for screening for infections with a predominantly sexual mode of transmission: Secondary | ICD-10-CM

## 2018-11-04 DIAGNOSIS — Z1389 Encounter for screening for other disorder: Secondary | ICD-10-CM

## 2018-11-04 DIAGNOSIS — F319 Bipolar disorder, unspecified: Secondary | ICD-10-CM

## 2018-11-04 DIAGNOSIS — J4520 Mild intermittent asthma, uncomplicated: Secondary | ICD-10-CM

## 2018-11-04 MED ORDER — PANTOPRAZOLE SODIUM 40 MG PO TBEC: 40.0000 mg | DELAYED_RELEASE_TABLET | Freq: Every day | ORAL | 3 refills | Status: DC

## 2018-11-04 MED ORDER — ALBUTEROL SULFATE HFA 108 (90 BASE) MCG/ACT IN AERS: 1.0000 | INHALATION_SPRAY | Freq: Four times a day (QID) | RESPIRATORY_TRACT | 11 refills | Status: DC | PRN

## 2018-11-04 MED ORDER — LISINOPRIL 20 MG PO TABS: 20.0000 mg | ORAL_TABLET | Freq: Every day | ORAL | 3 refills | Status: DC

## 2018-11-04 MED ORDER — DIVALPROEX SODIUM 500 MG PO DR TAB: 500.0000 mg | DELAYED_RELEASE_TABLET | Freq: Two times a day (BID) | ORAL | 5 refills | Status: DC

## 2018-11-04 MED ORDER — ASPIRIN EC 81 MG PO TBEC: 81.0000 mg | DELAYED_RELEASE_TABLET | Freq: Every day | ORAL | 3 refills | Status: DC

## 2018-11-04 MED ORDER — ALPRAZOLAM 0.25 MG PO TABS: 0.2500 mg | ORAL_TABLET | Freq: Every evening | ORAL | 2 refills | Status: DC | PRN

## 2018-11-04 MED ORDER — METOPROLOL SUCCINATE ER 50 MG PO TB24: 50.0000 mg | ORAL_TABLET | Freq: Every day | ORAL | 3 refills | Status: DC

## 2018-11-04 MED ORDER — ATORVASTATIN CALCIUM 40 MG PO TABS: 40.0000 mg | ORAL_TABLET | Freq: Every day | ORAL | 3 refills | Status: DC

## 2018-11-04 MED ORDER — CLOPIDOGREL BISULFATE 75 MG PO TABS: 75.0000 mg | ORAL_TABLET | Freq: Every day | ORAL | 3 refills | Status: DC

## 2018-11-04 MED ORDER — NITROGLYCERIN 0.4 MG SL SUBL: 0.4000 mg | SUBLINGUAL_TABLET | SUBLINGUAL | 5 refills | Status: DC | PRN

## 2018-11-04 NOTE — Progress Notes (Signed)
Failed Doxy Telephone Note  I connected with [facility]  on 11/04/18 at  1:35 PM EDT by a video enabled telemedicine application and verified that I am speaking with the correct person using two identifiers.  Location patient: home Location provider:work Persons participating in the virtual visit: patient, provider  I discussed the limitations of evaluation and management by telemedicine and the availability of in person appointments. The patient expressed understanding and agreed to proceed.   HPI: 1. Poor dentition and dental pain dentist needs letter with h/o CAD s/p stent medical clearance and will move appt up from 11/23/2018 sooner  She denies CP x 4-5 months and did not establish with cardiology due to finances since last visit 12/2017. Denies drug abuse currently reviewed UDS 11/2017 + cocaine, barbs, opiates she does report occasional THC and does not believe that was her urine  2. CAD s/p stent no chest pian in 4-5 months needs refill of meds. Not been checking her BP  3. Bipolar, anxiety/insomnia sleeping only 2 hrs at night and did not f/u with [facility] due to cost. trazadone is not helping with sleep, Remeron not helping did not take cymbalta. Depakote in the past 500 mg bid helped with her bipolar and xanax in the past with anxiety. She declines Seroquel .    ROS: See pertinent positives and negatives per HPI. Denies sob, chest pain   Past Medical History:  Diagnosis Date  . Anxiety   . Asthma   . Bipolar 1 disorder ([place])   . Bipolar disorder ([place])   . CAD (coronary artery disease)    s/p stent BMS OM Cx  . Cervical herniated disc 04/12/2016  . COPD (chronic obstructive pulmonary disease) ([place])   . Depression   . Diabetes mellitus without complication ([place])   . Diverticulitis   . GERD (gastroesophageal reflux disease)   . Glaucoma   . History of blood transfusion   . Hyperlipidemia   . Hypertension   . OSA (obstructive sleep apnea)    not using cpap   . Plantar  fasciitis    b/l feet s/p steroid shots w/o help and left surgery w/o help   . Sleep apnea   . UTI (urinary tract infection)     Past Surgical History:  Procedure Laterality Date  . ABDOMINAL HYSTERECTOMY    . ABDOMINAL SURGERY  1995   Bowel resection.  Marland Kitchen CARDIAC CATHETERIZATION N/A 04/14/2016   Procedure: Left Heart Cath and Coronary Angiography;  Surgeon: Burnell Blanks, MD;  Location: [place] [facility];  Service: Cardiovascular;  Laterality: N/A;  . CARDIAC SURGERY    . CORONARY ANGIOPLASTY WITH STENT PLACEMENT  2010   Drug eluting stent  . OVARIAN CYST REMOVAL      Family History  Problem Relation Age of Onset  . CAD Mother   . Depression Mother   . Heart disease Mother   . Hyperlipidemia Mother   . Hypertension Mother   . CAD Brother   . Depression Brother   . Diabetes Brother   . Heart disease Brother   . Hyperlipidemia Brother   . Heart disease Father   . Alcohol abuse Father     SOCIAL HX: lives at home    Current Outpatient Medications:  .  albuterol (VENTOLIN HFA) 108 (90 Base) MCG/ACT inhaler, Inhale 1-2 puffs into the lungs every 6 (six) hours as needed for wheezing or shortness of breath., Disp: 1 Inhaler, Rfl: 11 .  ALPRAZolam (XANAX) 0.25 MG tablet, Take  1 tablet (0.25 mg total) by mouth at bedtime as needed for anxiety or sleep., Disp: 30 tablet, Rfl: 2 .  aspirin EC 81 MG tablet, Take 1 tablet (81 mg total) by mouth daily., Disp: 90 tablet, Rfl: 3 .  atorvastatin (LIPITOR) 40 MG tablet, Take 1 tablet (40 mg total) by mouth daily at 6 PM., Disp: 90 tablet, Rfl: 3 .  clopidogrel (PLAVIX) 75 MG tablet, Take 1 tablet (75 mg total) by mouth daily., Disp: 90 tablet, Rfl: 3 .  divalproex (DEPAKOTE) 500 MG DR tablet, Take 1 tablet (500 mg total) by mouth 2 (two) times daily., Disp: 60 tablet, Rfl: 5 .  ipratropium-albuterol (DUONEB) 0.5-2.5 (3) MG/3ML SOLN, Take 3 mLs by nebulization every 6 (six) hours as needed., Disp: 360 mL, Rfl: 11 .  lisinopril  (ZESTRIL) 20 MG tablet, Take 1 tablet (20 mg total) by mouth daily., Disp: 90 tablet, Rfl: 3 .  metoprolol succinate (TOPROL-XL) 50 MG 24 hr tablet, Take 1 tablet (50 mg total) by mouth daily. Take with or immediately following a meal., Disp: 90 tablet, Rfl: 3 .  nitroGLYCERIN (NITROSTAT) 0.4 MG SL tablet, Place 1 tablet (0.4 mg total) under the tongue every 5 (five) minutes as needed for chest pain. X 2 call 911 after 3rd dose, Disp: 30 tablet, Rfl: 5 .  pantoprazole (PROTONIX) 40 MG tablet, Take 1 tablet (40 mg total) by mouth daily. In am before food, Disp: 90 tablet, Rfl: 3 .  promethazine (PHENERGAN) 25 MG suppository, Place 1 suppository (25 mg total) rectally 2 (two) times daily as needed for nausea or vomiting., Disp: 30 each, Rfl: 0 .  senna-docusate (SENOKOT-S) 8.6-50 MG tablet, Take 1 tablet by mouth daily as needed for mild constipation., Disp: 90 tablet, Rfl: 1  EXAM:  VITALS per patient if applicable:  GENERAL: alert, oriented, appears well and in no acute distress  PSYCH/NEURO: pleasant and cooperative, no obvious depression or anxiety, speech and thought processing grossly intact  ASSESSMENT AND PLAN:  Discussed the following assessment and plan:  Bipolar affective disorder, remission status unspecified (HCC)/anxiety/insomnia - Plan: divalproex (DEPAKOTE) 500 MG DR tablet bid  -see HPI meds worked and did not in the past  -advised pt after this w/in the next 6 months she needs to est psych with [facility] or Dr. Nicolasa Ducking in [place]  and given # to call she states cost has been an issue -advised if we check UDS in future and + will no longer Rx benzos   Gastroesophageal reflux disease, esophagitis presence not specified - Plan: pantoprazole (PROTONIX) 40 MG tablet  Essential hypertension - Plan: lisinopril (ZESTRIL) 20 MG tablet increase from 10 mg qd, aspirin EC 81 MG tablet  Coronary artery disease of native artery of native heart with stable angina pectoris ([place]) - Plan:  metoprolol succinate (TOPROL-XL) 50 MG 24 hr tablet, clopidogrel (PLAVIX) 75 MG tablet, atorvastatin (LIPITOR) 40 MG tablet, aspirin EC 81 MG tablet, nitroGLYCERIN (NITROSTAT) 0.4 MG SL tablet -advised she will need cardiology in future  Mild intermittent asthma, unspecified whether complicated - Plan: albuterol (VENTOLIN HFA) 108 (90 Base) MCG/ACT inhaler  HM Declines flu shot  Tdap likely due pt unsure if had 6-7 years ago  Consider pna 23, shingrix and in future if has not had   S/p hysterectomy will ask at f/u if had abnormal pap  Referred to GI need for colonoscopy and possibly EGD with issues. H/o sigmoid resection in past for h/o diverticulitis  referred mammogram   rec smoking  cessation smoking < or = 0.5 ppd also using THC  I discussed the assessment and treatment plan with the patient. The patient was provided an opportunity to ask questions and all were answered. The patient agreed with the plan and demonstrated an understanding of the instructions.   The patient was advised to call back or seek an in-person evaluation if the symptoms worsen or if the condition fails to improve as anticipated.  Time spent 25 minutes  Delorise Jackson, MD

## 2018-11-05 MED ORDER — PROMETHAZINE HCL 25 MG PO TABS: 12.5000 mg | ORAL_TABLET | Freq: Two times a day (BID) | ORAL | 0 refills | Status: DC | PRN

## 2018-11-05 NOTE — Addendum Note (Signed)
Addended by: Orland Mustard on: 11/05/2018 08:26 AM   Modules accepted: Orders

## 2018-11-07 ENCOUNTER — Emergency Department
Admission: EM | Admit: 2018-11-07 | Discharge: 2018-11-07 | Disposition: A | Discharge status: Home / Self Care | Payer: Medicare Other | Attending: Emergency Medicine | Admitting: Emergency Medicine

## 2018-11-07 ENCOUNTER — Other Ambulatory Visit: Payer: Self-pay

## 2018-11-07 ENCOUNTER — Emergency Department: Payer: Medicare Other

## 2018-11-07 DIAGNOSIS — R1030 Lower abdominal pain, unspecified: Secondary | ICD-10-CM | POA: Diagnosis not present

## 2018-11-07 DIAGNOSIS — N390 Urinary tract infection, site not specified: Secondary | ICD-10-CM | POA: Diagnosis not present

## 2018-11-07 DIAGNOSIS — R109 Unspecified abdominal pain: Secondary | ICD-10-CM | POA: Diagnosis not present

## 2018-11-07 LAB — COMPREHENSIVE METABOLIC PANEL
ALT: 33 U/L (ref 0–44)
AST: 18 U/L (ref 15–41)
Albumin: 3.9 g/dL (ref 3.5–5.0)
Alkaline Phosphatase: 112 U/L (ref 38–126)
Anion gap: 8 (ref 5–15)
BUN: 21 mg/dL — ABNORMAL HIGH (ref 6–20)
CO2: 25 mmol/L (ref 22–32)
Calcium: 8.6 mg/dL — ABNORMAL LOW (ref 8.9–10.3)
Chloride: 105 mmol/L (ref 98–111)
Creatinine, Ser: 0.64 mg/dL (ref 0.44–1.00)
GFR calc Af Amer: >60 mL/min (ref >60)
GFR calc non Af Amer: >60 mL/min (ref >60)
Glucose, Bld: 116 mg/dL — ABNORMAL HIGH (ref 70–99)
Potassium: 3.9 mmol/L (ref 3.5–5.1)
Sodium: 138 mmol/L (ref 135–145)
Total Bilirubin: 0.5 mg/dL (ref 0.3–1.2)
Total Protein: 6.9 g/dL (ref 6.5–8.1)

## 2018-11-07 LAB — URINALYSIS, COMPLETE (UACMP) WITH MICROSCOPIC
Bilirubin Urine: NEGATIVE
Glucose, UA: NEGATIVE mg/dL
Hgb urine dipstick: NEGATIVE
Ketones, ur: 5 mg/dL — AB
Nitrite: NEGATIVE
Protein, ur: NEGATIVE mg/dL
Specific Gravity, Urine: 1.026 (ref 1.005–1.030)
pH: 6 (ref 5.0–8.0)

## 2018-11-07 LAB — CBC WITH DIFFERENTIAL/PLATELET
Abs Immature Granulocytes: 0.05 10*3/uL (ref 0.00–0.07)
Basophils Absolute: 0.1 10*3/uL (ref 0.0–0.1)
Basophils Relative: 1 %
Eosinophils Absolute: 0.5 10*3/uL (ref 0.0–0.5)
Eosinophils Relative: 4 %
HCT: 41 % (ref 36.0–46.0)
Hemoglobin: 13.7 g/dL (ref 12.0–15.0)
Immature Granulocytes: 1 %
Lymphocytes Relative: 43 %
Lymphs Abs: 4.8 10*3/uL — ABNORMAL HIGH (ref 0.7–4.0)
MCH: 29 pg (ref 26.0–34.0)
MCHC: 33.4 g/dL (ref 30.0–36.0)
MCV: 86.9 fL (ref 80.0–100.0)
Monocytes Absolute: 0.9 10*3/uL (ref 0.1–1.0)
Monocytes Relative: 8 %
Neutro Abs: 4.7 10*3/uL (ref 1.7–7.7)
Neutrophils Relative %: 43 %
Platelets: 405 10*3/uL — ABNORMAL HIGH (ref 150–400)
RBC: 4.72 MIL/uL (ref 3.87–5.11)
RDW: 13.3 % (ref 11.5–15.5)
WBC: 11.2 10*3/uL — ABNORMAL HIGH (ref 4.0–10.5)
nRBC: 0 % (ref 0.0–0.2)

## 2018-11-07 LAB — LIPASE, BLOOD: Lipase: 31 U/L (ref 11–51)

## 2018-11-07 MED ORDER — MORPHINE SULFATE (PF) 4 MG/ML IV SOLN
INTRAVENOUS | Status: AC
  Administered 2018-11-07: 02:00:00
  Filled 2018-11-07: qty 1

## 2018-11-07 MED ORDER — ONDANSETRON HCL 4 MG/2ML IJ SOLN
INTRAMUSCULAR | Status: AC
  Administered 2018-11-07: 02:00:00
  Filled 2018-11-07: qty 2

## 2018-11-07 MED ORDER — SODIUM CHLORIDE 0.9 % IV SOLN
1.0000 g | Freq: Once | INTRAVENOUS | Status: AC
  Administered 2018-11-07: 04:00:00 1 g via INTRAVENOUS
  Filled 2018-11-07: qty 10

## 2018-11-07 MED ORDER — ONDANSETRON HCL 4 MG PO TABS: 4.0000 mg | ORAL_TABLET | Freq: Three times a day (TID) | ORAL | 0 refills | Status: DC | PRN

## 2018-11-07 MED ORDER — OXYCODONE HCL 5 MG PO TABS: 5.0000 mg | ORAL_TABLET | Freq: Three times a day (TID) | ORAL | 0 refills | Status: AC | PRN

## 2018-11-07 MED ORDER — HALOPERIDOL LACTATE 5 MG/ML IJ SOLN
2.5000 mg | Freq: Once | INTRAMUSCULAR | Status: AC
  Administered 2018-11-07: 04:00:00 2.5 mg via INTRAMUSCULAR
  Filled 2018-11-07: qty 1

## 2018-11-07 MED ORDER — CEPHALEXIN 500 MG PO CAPS: 500.0000 mg | ORAL_CAPSULE | Freq: Three times a day (TID) | ORAL | 0 refills | Status: AC

## 2018-11-07 NOTE — ED Triage Notes (Signed)
Patient reports right flank pain since Friday evening.  Patient denies urinary symptoms or injury.

## 2018-11-07 NOTE — ED Provider Notes (Addendum)
[facility] Emergency Department Provider Note  ____________________________________________   I have reviewed the triage vital signs and the nursing notes. Where available I have reviewed prior notes and, if possible and indicated, outside hospital notes.    HISTORY  Chief Complaint Flank Pain    HPI Shawna Anderson is a 52 y.o. female history of chronic abdominal pain, back pain, recurrent visits for vomiting and several different CT scans over the last several years which are negative of her abdomen, status post bowel resection remotely, status post hysterectomy and vomiting episodes in the past presents with right-sided abdominal pain, clear lower right flank.  No numbness no weakness no trauma no fall no chest pain or shortness of breath hurts to touch hurts to move, no numbness or weakness or incontinence of bowel or bladder.  Is on the lower right abdominal/flank.  Has had this multiple times before.  She states people cannot tell her what it is.  Nuys SI or HI, I do note the patient had a negative CT scan of her abdomen about a year ago, also in November 2018 as well as in 2016, all of the CTs were reassuring for similar complaints.  During that timeframe she is also had 2 different negative scans of her head.  Progress notes in the computer suggest the patient did talk to her primary care doctor recently about insomnia, but she has no SI or HI.  Has vaginal discharge.  Denies dysuria.  No history of kidney stones.     Past Medical History:  Diagnosis Date  . Anxiety   . Asthma   . Bipolar 1 disorder ([place])   . Bipolar disorder ([place])   . CAD (coronary artery disease)    s/p stent BMS OM Cx  . Cervical herniated disc 04/12/2016  . COPD (chronic obstructive pulmonary disease) ([place])   . Depression   . Diabetes mellitus without complication ([place])   . Diverticulitis   . GERD (gastroesophageal reflux disease)   . Glaucoma   . History of blood transfusion    . Hyperlipidemia   . Hypertension   . OSA (obstructive sleep apnea)    not using cpap   . Plantar fasciitis    b/l feet s/p steroid shots w/o help and left surgery w/o help   . Sleep apnea   . UTI (urinary tract infection)     Patient Active Problem List   Diagnosis Date Noted  . Bipolar disorder ([place]) 12/30/2017  . OSA (obstructive sleep apnea) 12/30/2017  . Constipation 12/30/2017  . Coronary artery disease of native artery of native heart with stable angina pectoris ([place]) 12/30/2017  . Gastroesophageal reflux disease 12/30/2017  . Asthma 12/29/2017  . COPD (chronic obstructive pulmonary disease) ([place]) 12/29/2017  . Anxiety and depression 12/29/2017  . Insomnia 12/29/2017  . Chronic back pain 12/29/2017  . Skin lesion of back 12/29/2017  . CAD S/P CFX PCI 2010 04/15/2016  . Essential hypertension 04/15/2016  . Dyslipidemia 04/15/2016  . Noncompliance with medication regimen 04/15/2016  . Chest pain with moderate risk of acute coronary syndrome 04/12/2016  . Abdominal pain 01/12/2015  . Vomiting 01/12/2015  . Nausea and vomiting 01/12/2015    Past Surgical History:  Procedure Laterality Date  . ABDOMINAL HYSTERECTOMY    . ABDOMINAL SURGERY  1995   Bowel resection.  Marland Kitchen CARDIAC CATHETERIZATION N/A 04/14/2016   Procedure: Left Heart Cath and Coronary Angiography;  Surgeon: Burnell Blanks, MD;  Location: [place] [facility];  Service: Cardiovascular;  Laterality: N/A;  . CARDIAC SURGERY    . CORONARY ANGIOPLASTY WITH STENT PLACEMENT  2010   Drug eluting stent  . OVARIAN CYST REMOVAL      Prior to Admission medications   Medication Sig Start Date End Date Taking? Authorizing Provider  albuterol (VENTOLIN HFA) 108 (90 Base) MCG/ACT inhaler Inhale 1-2 puffs into the lungs every 6 (six) hours as needed for wheezing or shortness of breath. 11/04/18   McLean-Scocuzza, Nino Glow, MD  ALPRAZolam Duanne Moron) 0.25 MG tablet Take 1 tablet (0.25 mg total) by mouth at bedtime as  needed for anxiety or sleep. 11/04/18   McLean-Scocuzza, Nino Glow, MD  aspirin EC 81 MG tablet Take 1 tablet (81 mg total) by mouth daily. 11/04/18   McLean-Scocuzza, Nino Glow, MD  atorvastatin (LIPITOR) 40 MG tablet Take 1 tablet (40 mg total) by mouth daily at 6 PM. 11/04/18   McLean-Scocuzza, Nino Glow, MD  clopidogrel (PLAVIX) 75 MG tablet Take 1 tablet (75 mg total) by mouth daily. 11/04/18   McLean-Scocuzza, Nino Glow, MD  divalproex (DEPAKOTE) 500 MG DR tablet Take 1 tablet (500 mg total) by mouth 2 (two) times daily. 11/04/18   McLean-Scocuzza, Nino Glow, MD  ipratropium-albuterol (DUONEB) 0.5-2.5 (3) MG/3ML SOLN Take 3 mLs by nebulization every 6 (six) hours as needed. 12/29/17   McLean-Scocuzza, Nino Glow, MD  lisinopril (ZESTRIL) 20 MG tablet Take 1 tablet (20 mg total) by mouth daily. 11/04/18   McLean-Scocuzza, Nino Glow, MD  metoprolol succinate (TOPROL-XL) 50 MG 24 hr tablet Take 1 tablet (50 mg total) by mouth daily. Take with or immediately following a meal. 11/04/18   McLean-Scocuzza, Nino Glow, MD  nitroGLYCERIN (NITROSTAT) 0.4 MG SL tablet Place 1 tablet (0.4 mg total) under the tongue every 5 (five) minutes as needed for chest pain. X 2 call 911 after 3rd dose 11/04/18   McLean-Scocuzza, Nino Glow, MD  pantoprazole (PROTONIX) 40 MG tablet Take 1 tablet (40 mg total) by mouth daily. In am before food 11/04/18   McLean-Scocuzza, Nino Glow, MD  promethazine (PHENERGAN) 25 MG tablet Take 0.5-1 tablets (12.5-25 mg total) by mouth 2 (two) times daily as needed for nausea or vomiting. 11/05/18   McLean-Scocuzza, Nino Glow, MD  senna-docusate (SENOKOT-S) 8.6-50 MG tablet Take 1 tablet by mouth daily as needed for mild constipation. 12/29/17   McLean-Scocuzza, Nino Glow, MD    Allergies Ativan [lorazepam]; Latex; Tape; and Drixoral allergy sinus [dexbromphen-pse-apap er]  Family History  Problem Relation Age of Onset  . CAD Mother   . Depression Mother   . Heart disease Mother   . Hyperlipidemia Mother   .  Hypertension Mother   . CAD Brother   . Depression Brother   . Diabetes Brother   . Heart disease Brother   . Hyperlipidemia Brother   . Heart disease Father   . Alcohol abuse Father     Social History Social History   Tobacco Use  . Smoking status: Current Every Day Smoker    Packs/day: 0.50    Years: 30.00    Pack years: 15.00    Types: Cigarettes  . Smokeless tobacco: Never Used  . Tobacco comment: refused  Substance Use Topics  . Alcohol use: Yes    Comment: occasional  . Drug use: Yes    Frequency: 7.0 times per week    Types: Marijuana    Review of Systems Constitutional: No fever/chills Eyes: No visual changes. ENT: No sore throat. No stiff neck no  neck pain Cardiovascular: Denies chest pain. Respiratory: Denies shortness of breath. Gastrointestinal:   no vomiting.  No diarrhea.  No constipation. Genitourinary: Negative for dysuria. Musculoskeletal: Negative lower extremity swelling Skin: Negative for rash. Neurological: Negative for severe headaches, focal weakness or numbness.   ____________________________________________   PHYSICAL EXAM:  VITAL SIGNS: ED Triage Vitals  Enc Vitals Group     BP 11/07/18 0216 (!) 154/128     Pulse Rate 11/07/18 0216 75     Resp --      Temp 11/07/18 0216 98.3 F (36.8 C)     Temp Source 11/07/18 0216 Oral     SpO2 11/07/18 0216 98 %     Weight 11/07/18 0202 180 lb (81.6 kg)     Height 11/07/18 0202 5\' 3"  (1.6 m)     Head Circumference --      Peak Flow --      Pain Score 11/07/18 0207 10     Pain Loc --      Pain Edu? --      Excl. in [place]? --     Constitutional: Alert and oriented.  She is anxious and upset but not demonstrating any evidence of medical toxicity Eyes: Conjunctivae are normal Head: Atraumatic HEENT: No congestion/rhinnorhea. Mucous membranes are moist.  Oropharynx non-erythematous Neck:   Nontender with no meningismus, no masses, no stridor Cardiovascular: Normal rate, regular rhythm.  Grossly normal heart sounds.  Good peripheral circulation. Respiratory: Normal respiratory effort.  No retractions. Lungs CTAB. Abdominal: Soft and tenderness palpation mostly in the right flank but a little bit in the right lower quadrant. No distention. No guarding no rebound Back:  There is no focal tenderness or step off.  there is no midline tenderness there are no lesions noted. there is no CVA tenderness Musculoskeletal: No lower extremity tenderness, no upper extremity tenderness. No joint effusions, no DVT signs strong distal pulses no edema Neurologic:  Normal speech and language. No gross focal neurologic deficits are appreciated.  Skin:  Skin is warm, dry and intact. No rash noted. Psychiatric: Mood and affect are normal. Speech and behavior are normal.  ____________________________________________   LABS (all labs ordered are listed, but only abnormal results are displayed)  Labs Reviewed  CBC WITH DIFFERENTIAL/PLATELET - Abnormal; Notable for the following components:      Result Value   WBC 11.2 (*)    Platelets 405 (*)    All other components within normal limits  COMPREHENSIVE METABOLIC PANEL - Abnormal; Notable for the following components:   Glucose, Bld 116 (*)    BUN 21 (*)    Calcium 8.6 (*)    All other components within normal limits  LIPASE, BLOOD  URINALYSIS, COMPLETE (UACMP) WITH MICROSCOPIC  POC URINE PREG, ED    Pertinent labs  results that were available during my care of the patient were reviewed by me and considered in my medical decision making (see chart for details). ____________________________________________  EKG  I personally interpreted any EKGs ordered by me or triage  ____________________________________________  RADIOLOGY  Pertinent labs & imaging results that were available during my care of the patient were reviewed by me and considered in my medical decision making (see chart for details). If possible, patient and/or family made  aware of any abnormal findings.  Ct Renal Stone Study  Result Date: 11/07/2018 CLINICAL DATA:  Right flank pain EXAM: CT ABDOMEN AND PELVIS WITHOUT CONTRAST TECHNIQUE: Multidetector CT imaging of the abdomen and pelvis was performed following the  standard protocol without IV contrast. COMPARISON:  11/14/2017 FINDINGS: Lower chest: Lung bases are clear. No effusions. Heart is normal size. Hepatobiliary: No focal hepatic abnormality. Gallbladder unremarkable. Pancreas: No focal abnormality or ductal dilatation. Spleen: No focal abnormality.  Normal size. Adrenals/Urinary Tract: No adrenal abnormality. No focal renal abnormality. No stones or hydronephrosis. Urinary bladder is unremarkable. Stomach/Bowel: Postoperative changes in the sigmoid colon. Scattered colonic diverticula. No active diverticulitis. Stomach and small bowel decompressed, unremarkable. Vascular/Lymphatic: Aortic atherosclerosis. No enlarged abdominal or pelvic lymph nodes. Reproductive: Prior hysterectomy.  No adnexal masses. Other: No free fluid or free air. Musculoskeletal: No acute bony abnormality. IMPRESSION: No renal or ureteral stones.  No hydronephrosis. Stable postoperative appearance in the sigmoid colon. Aortic atherosclerosis. No acute findings. Electronically Signed   By: Rolm Baptise M.D.   On: 11/07/2018 02:39   ____________________________________________    PROCEDURES  Procedure(s) performed: None  Procedures  Critical Care performed: None  ____________________________________________   INITIAL IMPRESSION / ASSESSMENT AND PLAN / ED COURSE  Pertinent labs & imaging results that were available during my care of the patient were reviewed by me and considered in my medical decision making (see chart for details).  Patient here with anxiety and which she admits to and right-sided very reproducible flank pain.  Do not think this is ACS PE or dissection, is quite reproducible really in the right flank, there is no  pleuritic component to this her lungs are clear.  I did give her some pain medications she is calming down somewhat.  CT scan is completely normal blood work is reassuring as of appendicitis, no evidence of gallbladder disease CBC CMP lipase are all reassuring.  It is possible that she has pyelonephritis, this gave [place] a urine sample we will see what that shows because this right-sided reproducible discomfort will also check her gallbladder with an ultrasound as sometimes we can see things that we do not see on CT scan and we will go from there.  ----------------------------------------- 3:32 AM on 11/07/2018 -----------------------------------------  Patient now reports that this is similar to multiple different episodes she has had of cyclic vomiting, notes reveal that Haldol usually helps her.  She is not actually vomiting it but she feels nauseated and occasionally states that she is very uncomfortable.  This is she states very similar to multiple prior episodes of this exact same pathology.  We will see what ultrasound shows but given that all of her findings are so reassuring, is my hope that perhaps Haldol will make her feel better.  Analysis is still pending.   ____________________________________________   FINAL CLINICAL IMPRESSION(S) / ED DIAGNOSES  Final diagnoses:  None      This chart was dictated using voice recognition software.  Despite best efforts to proofread,  errors can occur which can change meaning.      Schuyler Amor, MD 11/07/18 [id number]    Schuyler Amor, MD 11/07/18 [phone]

## 2018-11-08 LAB — URINE CULTURE: Culture: <10000 — AB

## 2018-11-10 ENCOUNTER — Encounter: Payer: Self-pay | Admitting: Internal Medicine

## 2018-11-12 ENCOUNTER — Encounter: Payer: Self-pay | Admitting: Emergency Medicine

## 2018-11-12 ENCOUNTER — Emergency Department
Admission: EM | Admit: 2018-11-12 | Discharge: 2018-11-12 | Disposition: A | Discharge status: Home / Self Care | Payer: Medicare Other | Attending: Emergency Medicine | Admitting: Emergency Medicine

## 2018-11-12 ENCOUNTER — Other Ambulatory Visit: Payer: Self-pay

## 2018-11-12 DIAGNOSIS — I2510 Atherosclerotic heart disease of native coronary artery without angina pectoris: Secondary | ICD-10-CM | POA: Insufficient documentation

## 2018-11-12 DIAGNOSIS — B029 Zoster without complications: Secondary | ICD-10-CM

## 2018-11-12 DIAGNOSIS — I10 Essential (primary) hypertension: Secondary | ICD-10-CM | POA: Diagnosis not present

## 2018-11-12 DIAGNOSIS — Z91040 Latex allergy status: Secondary | ICD-10-CM | POA: Insufficient documentation

## 2018-11-12 DIAGNOSIS — R0602 Shortness of breath: Secondary | ICD-10-CM | POA: Diagnosis not present

## 2018-11-12 DIAGNOSIS — Z79899 Other long term (current) drug therapy: Secondary | ICD-10-CM | POA: Diagnosis not present

## 2018-11-12 DIAGNOSIS — F17210 Nicotine dependence, cigarettes, uncomplicated: Secondary | ICD-10-CM | POA: Insufficient documentation

## 2018-11-12 DIAGNOSIS — B9789 Other viral agents as the cause of diseases classified elsewhere: Secondary | ICD-10-CM | POA: Diagnosis not present

## 2018-11-12 DIAGNOSIS — J449 Chronic obstructive pulmonary disease, unspecified: Secondary | ICD-10-CM | POA: Insufficient documentation

## 2018-11-12 DIAGNOSIS — Z7901 Long term (current) use of anticoagulants: Secondary | ICD-10-CM | POA: Diagnosis not present

## 2018-11-12 DIAGNOSIS — J028 Acute pharyngitis due to other specified organisms: Secondary | ICD-10-CM | POA: Diagnosis not present

## 2018-11-12 DIAGNOSIS — R109 Unspecified abdominal pain: Secondary | ICD-10-CM | POA: Diagnosis not present

## 2018-11-12 DIAGNOSIS — R6889 Other general symptoms and signs: Secondary | ICD-10-CM | POA: Diagnosis not present

## 2018-11-12 MED ORDER — HYDROMORPHONE HCL 1 MG/ML IJ SOLN
0.5000 mg | Freq: Once | INTRAMUSCULAR | Status: AC
  Administered 2018-11-12: 0.5 mg via INTRAVENOUS
  Filled 2018-11-12: qty 1

## 2018-11-12 MED ORDER — VALACYCLOVIR HCL 500 MG PO TABS
1000.0000 mg | ORAL_TABLET | Freq: Once | ORAL | Status: AC
  Administered 2018-11-12: 1000 mg via ORAL
  Filled 2018-11-12: qty 2

## 2018-11-12 MED ORDER — HYDROMORPHONE HCL 2 MG PO TABS: 2.0000 mg | ORAL_TABLET | Freq: Two times a day (BID) | ORAL | 0 refills | Status: DC | PRN

## 2018-11-12 MED ORDER — HYDROMORPHONE HCL 1 MG/ML IJ SOLN
0.5000 mg | Freq: Once | INTRAMUSCULAR | Status: AC
  Administered 2018-11-12: 0.5 mg via INTRAVENOUS

## 2018-11-12 MED ORDER — ONDANSETRON HCL 4 MG/2ML IJ SOLN
4.0000 mg | Freq: Once | INTRAMUSCULAR | Status: AC
  Administered 2018-11-12: 4 mg via INTRAVENOUS
  Filled 2018-11-12: qty 2

## 2018-11-12 MED ORDER — KETOROLAC TROMETHAMINE 30 MG/ML IJ SOLN
30.0000 mg | Freq: Once | INTRAMUSCULAR | Status: AC
  Administered 2018-11-12: 30 mg via INTRAVENOUS
  Filled 2018-11-12: qty 1

## 2018-11-12 MED ORDER — VALACYCLOVIR HCL 1 G PO TABS: 1000.0000 mg | ORAL_TABLET | Freq: Three times a day (TID) | ORAL | 0 refills | Status: AC

## 2018-11-12 MED ORDER — PROMETHAZINE HCL 12.5 MG PO TABS: 12.5000 mg | ORAL_TABLET | Freq: Four times a day (QID) | ORAL | 0 refills | Status: DC | PRN

## 2018-11-12 NOTE — ED Notes (Signed)
Pt alert and oriented X4, active, cooperative, pt in NAD. RR even and unlabored, color WNL.  Pt informed to return if any life threatening symptoms occur.  Discharge and followup instructions reviewed. Is not driving.

## 2018-11-12 NOTE — Discharge Instructions (Addendum)
Take the valacyclovir 1 pill 3 times a day.  This should help suppress the virus.  It should also help with the pain somewhat by suppressing the virus.  I wrote you a prescription for Phenergan you can take 1 of those 4 times a day for the nausea.  I also wrote you a prescription for some Dilaudid pills you can take 1 twice a day.  Do not mix the Dilaudid with the oxycodone.  That would be too much pain medication and you might stop you from breathing.  Do not take any more than 1 Dilaudid twice a day.  If you need more pain medicine you can take the Tylenol and Motrin with the Dilaudid.  You can take Tylenol 4 times a day and you can take Motrin 2 of the over-the-counter pills 3 or 4 times a day for about a week.  Please follow-up with your regular doctor once the blisters have all crusted.  Return here as needed.  Please return here if you get blisters higher or lower on your body.  They may come out in a band around your waist on the one side, that would be normal but if they spread above or below that band need to come back we need to see you again.  Also please return for any other symptoms.  Be careful the Dilaudid can make you sleepy.  Do not drive on it.  It can also make you very constipated.  Make sure you are getting a little bit of exercise and plenty of fluid and fiber that should help.

## 2018-11-12 NOTE — ED Triage Notes (Signed)
Pt to ED via POV c/o pain and rash on her lower right abdomen.

## 2018-11-12 NOTE — ED Provider Notes (Signed)
[facility] Emergency Department Provider Note   ____________________________________________   First MD Initiated Contact with Patient 11/12/18 [phone]     (approximate)  I have reviewed the triage vital signs and the nursing notes.   HISTORY  Chief Complaint Herpes Zoster   HPI Shawna Anderson is a 53 y.o. female who comes in complaining of severe abdominal pain.  She has a history of chronic abdominal pain and back pain with several visits for vomiting and CT scans of her abdomen which have been negative.  Most recently she was here on the 26th with a negative CT scan.  Patient reports that this pain that she developed for her visit on the 26th ran from the right flank around to the mid right abdomen.  Patient reports about 3 days after she was in the hospital she developed a rash on her abdomen.  The pain is still in a stripe going from the right side of the back around horizontally to the front of the abdomen where the rashes.  This is most consistent with shingles.  The patient is not tender elsewhere.  She is tender to light touch on the skin in the area of this band.  The blister is been present for possibly 2 days.  There are still some fresh blisters and some scabbed.  Review of up-to-date recommends even if the lesions are over 11 hours old which I am not sure that they are if new ones are forming and not everything is scabbed we can still treat with antivirals.  I will therefore give her some valacyclovir.        Past Medical History:  Diagnosis Date  . Anxiety   . Asthma   . Bipolar 1 disorder ([place])   . Bipolar disorder ([place])   . CAD (coronary artery disease)    s/p stent BMS OM Cx  . Cervical herniated disc 04/12/2016  . COPD (chronic obstructive pulmonary disease) ([place])   . Depression   . Diabetes mellitus without complication ([place])   . Diverticulitis   . GERD (gastroesophageal reflux disease)   . Glaucoma   . History of blood transfusion    . Hyperlipidemia   . Hypertension   . OSA (obstructive sleep apnea)    not using cpap   . Plantar fasciitis    b/l feet s/p steroid shots w/o help and left surgery w/o help   . Sleep apnea   . UTI (urinary tract infection)     Patient Active Problem List   Diagnosis Date Noted  . Bipolar disorder ([place]) 12/30/2017  . OSA (obstructive sleep apnea) 12/30/2017  . Constipation 12/30/2017  . Coronary artery disease of native artery of native heart with stable angina pectoris ([place]) 12/30/2017  . Gastroesophageal reflux disease 12/30/2017  . Asthma 12/29/2017  . COPD (chronic obstructive pulmonary disease) ([place]) 12/29/2017  . Anxiety and depression 12/29/2017  . Insomnia 12/29/2017  . Chronic back pain 12/29/2017  . Skin lesion of back 12/29/2017  . CAD S/P CFX PCI 2010 04/15/2016  . Essential hypertension 04/15/2016  . Dyslipidemia 04/15/2016  . Noncompliance with medication regimen 04/15/2016  . Chest pain with moderate risk of acute coronary syndrome 04/12/2016  . Abdominal pain 01/12/2015  . Vomiting 01/12/2015  . Nausea and vomiting 01/12/2015    Past Surgical History:  Procedure Laterality Date  . ABDOMINAL HYSTERECTOMY    . ABDOMINAL SURGERY  1995   Bowel resection.  Marland Kitchen CARDIAC CATHETERIZATION N/A 04/14/2016   Procedure:  Left Heart Cath and Coronary Angiography;  Surgeon: Burnell Blanks, MD;  Location: [place] [facility];  Service: Cardiovascular;  Laterality: N/A;  . CARDIAC SURGERY    . CORONARY ANGIOPLASTY WITH STENT PLACEMENT  2010   Drug eluting stent  . OVARIAN CYST REMOVAL      Prior to Admission medications   Medication Sig Start Date End Date Taking? Authorizing Provider  albuterol (VENTOLIN HFA) 108 (90 Base) MCG/ACT inhaler Inhale 1-2 puffs into the lungs every 6 (six) hours as needed for wheezing or shortness of breath. 11/04/18   McLean-Scocuzza, Nino Glow, MD  ALPRAZolam Duanne Moron) 0.25 MG tablet Take 1 tablet (0.25 mg total) by mouth at bedtime as  needed for anxiety or sleep. 11/04/18   McLean-Scocuzza, Nino Glow, MD  aspirin EC 81 MG tablet Take 1 tablet (81 mg total) by mouth daily. 11/04/18   McLean-Scocuzza, Nino Glow, MD  atorvastatin (LIPITOR) 40 MG tablet Take 1 tablet (40 mg total) by mouth daily at 6 PM. 11/04/18   McLean-Scocuzza, Nino Glow, MD  cephALEXin (KEFLEX) 500 MG capsule Take 1 capsule (500 mg total) by mouth 3 (three) times daily for 10 days. 11/07/18 11/17/18  Schuyler Amor, MD  clopidogrel (PLAVIX) 75 MG tablet Take 1 tablet (75 mg total) by mouth daily. 11/04/18   McLean-Scocuzza, Nino Glow, MD  divalproex (DEPAKOTE) 500 MG DR tablet Take 1 tablet (500 mg total) by mouth 2 (two) times daily. 11/04/18   McLean-Scocuzza, Nino Glow, MD  HYDROmorphone (DILAUDID) 2 MG tablet Take 1 tablet (2 mg total) by mouth every 12 (twelve) hours as needed for up to 5 days for severe pain. 11/12/18 11/17/18  Nena Polio, MD  ipratropium-albuterol (DUONEB) 0.5-2.5 (3) MG/3ML SOLN Take 3 mLs by nebulization every 6 (six) hours as needed. 12/29/17   McLean-Scocuzza, Nino Glow, MD  lisinopril (ZESTRIL) 20 MG tablet Take 1 tablet (20 mg total) by mouth daily. 11/04/18   McLean-Scocuzza, Nino Glow, MD  metoprolol succinate (TOPROL-XL) 50 MG 24 hr tablet Take 1 tablet (50 mg total) by mouth daily. Take with or immediately following a meal. 11/04/18   McLean-Scocuzza, Nino Glow, MD  nitroGLYCERIN (NITROSTAT) 0.4 MG SL tablet Place 1 tablet (0.4 mg total) under the tongue every 5 (five) minutes as needed for chest pain. X 2 call 911 after 3rd dose 11/04/18   McLean-Scocuzza, Nino Glow, MD  ondansetron (ZOFRAN) 4 MG tablet Take 1 tablet (4 mg total) by mouth every 8 (eight) hours as needed for nausea or vomiting. 11/07/18   Schuyler Amor, MD  pantoprazole (PROTONIX) 40 MG tablet Take 1 tablet (40 mg total) by mouth daily. In am before food 11/04/18   McLean-Scocuzza, Nino Glow, MD  promethazine (PHENERGAN) 12.5 MG tablet Take 1 tablet (12.5 mg total) by mouth every 6 (six) hours  as needed for nausea or vomiting. 11/12/18   Nena Polio, MD  promethazine (PHENERGAN) 25 MG tablet Take 0.5-1 tablets (12.5-25 mg total) by mouth 2 (two) times daily as needed for nausea or vomiting. 11/05/18   McLean-Scocuzza, Nino Glow, MD  senna-docusate (SENOKOT-S) 8.6-50 MG tablet Take 1 tablet by mouth daily as needed for mild constipation. 12/29/17   McLean-Scocuzza, Nino Glow, MD  valACYclovir (VALTREX) 1000 MG tablet Take 1 tablet (1,000 mg total) by mouth 3 (three) times daily for 2 days. 11/12/18 11/14/18  Nena Polio, MD    Allergies Ativan [lorazepam]; Latex; Tape; and Drixoral allergy sinus [dexbromphen-pse-apap er]  Family History  Problem  Relation Age of Onset  . CAD Mother   . Depression Mother   . Heart disease Mother   . Hyperlipidemia Mother   . Hypertension Mother   . CAD Brother   . Depression Brother   . Diabetes Brother   . Heart disease Brother   . Hyperlipidemia Brother   . Heart disease Father   . Alcohol abuse Father     Social History Social History   Tobacco Use  . Smoking status: Current Every Day Smoker    Packs/day: 0.50    Years: 30.00    Pack years: 15.00    Types: Cigarettes  . Smokeless tobacco: Never Used  . Tobacco comment: refused  Substance Use Topics  . Alcohol use: Yes    Comment: occasional  . Drug use: Yes    Frequency: 7.0 times per week    Types: Marijuana    Review of Systems  Constitutional: No fever/chills Eyes: No visual changes. ENT: No sore throat. Cardiovascular: Denies chest pain. Respiratory: Denies shortness of breath. Gastrointestinal: See HPI patient is nauseated but not vomiting she has no diarrhea or constipation. Genitourinary: Negative for dysuria. Musculoskeletal: Negative for back pain. Skin: Negative for rash. Neurological: Negative for headaches, focal weakness    ____________________________________________   PHYSICAL EXAM:  VITAL SIGNS: ED Triage Vitals  Enc Vitals Group     BP 11/12/18  0732 (!) 191/106     Pulse Rate 11/12/18 0732 75     Resp 11/12/18 0732 16     Temp 11/12/18 0732 98.3 F (36.8 C)     Temp Source 11/12/18 0732 Oral     SpO2 11/12/18 0732 99 %     Weight 11/12/18 0729 185 lb (83.9 kg)     Height 11/12/18 0729 5\' 3"  (1.6 m)     Head Circumference --      Peak Flow --      Pain Score 11/12/18 0729 10     Pain Loc --      Pain Edu? --      Excl. in [place]? --     Constitutional: Alert and oriented.  Complaining of severe pain in her belly Eyes: Conjunctivae are normal.  Head: Atraumatic. Nose: No congestion/rhinnorhea. Mouth/Throat: Mucous membranes are moist.  Oropharynx non-erythematous. Neck: No stridor.  Cardiovascular: Normal rate, regular rhythm. Grossly normal heart sounds.  Good peripheral circulation. Respiratory: Normal respiratory effort.  No retractions. Lungs CTAB. Gastrointestinal: Soft rash as described tenderness to the skin as described in HPI no distention. No abdominal bruits. No CVA tenderness. Musculoskeletal: No lower extremity tenderness nor edema.  No joint effusions. Neurologic:  Normal speech and language. No gross focal neurologic deficits are appreciated. Skin:  Skin is warm, dry and intact. No rash noted elsewhere on body.   ____________________________________________   LABS (all labs ordered are listed, but only abnormal results are displayed)  Labs Reviewed - No data to display ____________________________________________  EKG   ____________________________________________  RADIOLOGY  ED MD interpretation:    Official radiology report(s): No results found.  ____________________________________________   PROCEDURES  Procedure(s) performed (including Critical Care):  Procedures   ____________________________________________   INITIAL IMPRESSION / ASSESSMENT AND PLAN / ED COURSE  Patient with shingles.  She was given oxycodone during her last visit she says it does not help at all or just makes  her nauseated.  I will give her some Phenergan and a few days of Dilaudid p.o.  I will also give her the valacyclovir.  Up-to-date  is not currently recommending steroids.  I think that her blood pressure is elevated due to the pain which appears to be severe.  We will see if we can get her pain under control see if her blood pressure goes down.             ____________________________________________   FINAL CLINICAL IMPRESSION(S) / ED DIAGNOSES  Final diagnoses:  Herpes zoster without complication     ED Discharge Orders         Ordered    valACYclovir (VALTREX) 1000 MG tablet  3 times daily     11/12/18 0810    promethazine (PHENERGAN) 12.5 MG tablet  Every 6 hours PRN     11/12/18 0810    HYDROmorphone (DILAUDID) 2 MG tablet  Every 12 hours PRN     11/12/18 0810           Note:  This document was prepared using Dragon voice recognition software and may include unintentional dictation errors.    Nena Polio, MD 11/12/18 [phone]

## 2018-11-15 ENCOUNTER — Telehealth: Payer: Self-pay | Admitting: Internal Medicine

## 2018-11-16 ENCOUNTER — Ambulatory Visit (INDEPENDENT_AMBULATORY_CARE_PROVIDER_SITE_OTHER): Payer: Medicare Other | Admitting: Internal Medicine

## 2018-11-16 DIAGNOSIS — B029 Zoster without complications: Secondary | ICD-10-CM

## 2018-11-16 DIAGNOSIS — I10 Essential (primary) hypertension: Secondary | ICD-10-CM

## 2018-11-16 DIAGNOSIS — B0229 Other postherpetic nervous system involvement: Secondary | ICD-10-CM | POA: Diagnosis not present

## 2018-11-16 MED ORDER — VALACYCLOVIR HCL 1 G PO TABS: 1000.0000 mg | ORAL_TABLET | Freq: Three times a day (TID) | ORAL | 0 refills | Status: DC

## 2018-11-16 MED ORDER — OLMESARTAN MEDOXOMIL-HCTZ 20-12.5 MG PO TABS: 1.0000 | ORAL_TABLET | Freq: Every day | ORAL | 0 refills | Status: DC

## 2018-11-16 MED ORDER — HYDROMORPHONE HCL 2 MG PO TABS: 2.0000 mg | ORAL_TABLET | Freq: Two times a day (BID) | ORAL | 0 refills | Status: AC | PRN

## 2018-11-16 MED ORDER — LIDOCAINE 5 % EX PTCH: 1.0000 | MEDICATED_PATCH | CUTANEOUS | 0 refills | Status: DC

## 2018-11-16 MED ORDER — GABAPENTIN 300 MG PO CAPS: 300.0000 mg | ORAL_CAPSULE | Freq: Every day | ORAL | 3 refills | Status: DC

## 2018-11-16 NOTE — Progress Notes (Signed)
Telephone Note failed Doxy  I connected with [facility]  on 11/16/18 at  8:39 AM EDT by telephone and verified that I am speaking with the correct person using two identifiers.  Location patient: home Location provider:work  Persons participating in the virtual visit: patient, provider  I discussed the limitations of evaluation and management by telemedicine and the availability of in person appointments. The patient expressed understanding and agreed to proceed.   HPI: 1. Shingles started 11/08/2018 around a small child who is a friend of hers. Started as small red bumps in clusters painful 10/10 on right side of abdomen going to right rib cage given Valtrex, phenerghan and diluadid in the ED due to pain she has 5 pills left of diluadid.  Pain feels like it is eating her alive and burning worse at night around 2-3 am cant sleep  2. HTN uncontrolled on lis 20 mg qd and toprol XL 50 mg qd    ROS: See pertinent positives and negatives per HPI.  Past Medical History:  Diagnosis Date  . Anxiety   . Asthma   . Bipolar 1 disorder ([place])   . Bipolar disorder ([place])   . CAD (coronary artery disease)    s/p stent BMS OM Cx  . Cervical herniated disc 04/12/2016  . COPD (chronic obstructive pulmonary disease) ([place])   . Depression   . Diabetes mellitus without complication ([place])   . Diverticulitis   . GERD (gastroesophageal reflux disease)   . Glaucoma   . History of blood transfusion   . Hyperlipidemia   . Hypertension   . OSA (obstructive sleep apnea)    not using cpap   . Plantar fasciitis    b/l feet s/p steroid shots w/o help and left surgery w/o help   . Sleep apnea   . UTI (urinary tract infection)     Past Surgical History:  Procedure Laterality Date  . ABDOMINAL HYSTERECTOMY    . ABDOMINAL SURGERY  1995   Bowel resection.  Marland Kitchen CARDIAC CATHETERIZATION N/A 04/14/2016   Procedure: Left Heart Cath and Coronary Angiography;  Surgeon: Burnell Blanks, MD;  Location: [place] [facility];  Service: Cardiovascular;  Laterality: N/A;  . CARDIAC SURGERY    . CORONARY ANGIOPLASTY WITH STENT PLACEMENT  2010   Drug eluting stent  . OVARIAN CYST REMOVAL      Family History  Problem Relation Age of Onset  . CAD Mother   . Depression Mother   . Heart disease Mother   . Hyperlipidemia Mother   . Hypertension Mother   . CAD Brother   . Depression Brother   . Diabetes Brother   . Heart disease Brother   . Hyperlipidemia Brother   . Heart disease Father   . Alcohol abuse Father     SOCIAL HX: unemployed lives alone    Current Outpatient Medications:  .  albuterol (VENTOLIN HFA) 108 (90 Base) MCG/ACT inhaler, Inhale 1-2 puffs into the lungs every 6 (six) hours as needed for wheezing or shortness of breath., Disp: 1 Inhaler, Rfl: 11 .  ALPRAZolam (XANAX) 0.25 MG tablet, Take 1 tablet (0.25 mg total) by mouth at bedtime as needed for anxiety or sleep., Disp: 30 tablet, Rfl: 2 .  aspirin EC 81 MG tablet, Take 1 tablet (81 mg total) by mouth daily., Disp: 90 tablet, Rfl: 3 .  atorvastatin (LIPITOR) 40 MG tablet, Take 1 tablet (40 mg total) by mouth daily at 6 PM., Disp: 90 tablet, Rfl: 3 .  cephALEXin (KEFLEX) 500 MG capsule, Take 1 capsule (500 mg total) by mouth 3 (three) times daily for 10 days., Disp: 30 capsule, Rfl: 0 .  clopidogrel (PLAVIX) 75 MG tablet, Take 1 tablet (75 mg total) by mouth daily., Disp: 90 tablet, Rfl: 3 .  divalproex (DEPAKOTE) 500 MG DR tablet, Take 1 tablet (500 mg total) by mouth 2 (two) times daily., Disp: 60 tablet, Rfl: 5 .  gabapentin (NEURONTIN) 300 MG capsule, Take 1 capsule (300 mg total) by mouth at bedtime. If not better after 1 week increase to 2 pills at night, Disp: 60 capsule, Rfl: 3 .  HYDROmorphone (DILAUDID) 2 MG tablet, Take 1 tablet (2 mg total) by mouth every 12 (twelve) hours as needed for up to 5 days for severe pain., Disp: 10 tablet, Rfl: 0 .  ipratropium-albuterol (DUONEB) 0.5-2.5 (3) MG/3ML SOLN, Take 3 mLs by  nebulization every 6 (six) hours as needed., Disp: 360 mL, Rfl: 11 .  lidocaine (LIDODERM) 5 %, Place 1 patch onto the skin daily. Remove & Discard patch within 12 hours or as directed by MD, Disp: 30 patch, Rfl: 0 .  metoprolol succinate (TOPROL-XL) 50 MG 24 hr tablet, Take 1 tablet (50 mg total) by mouth daily. Take with or immediately following a meal., Disp: 90 tablet, Rfl: 3 .  nitroGLYCERIN (NITROSTAT) 0.4 MG SL tablet, Place 1 tablet (0.4 mg total) under the tongue every 5 (five) minutes as needed for chest pain. X 2 call 911 after 3rd dose, Disp: 30 tablet, Rfl: 5 .  olmesartan-hydrochlorothiazide (BENICAR HCT) 20-12.5 MG tablet, Take 1 tablet by mouth daily. In am, Disp: 90 tablet, Rfl: 0 .  ondansetron (ZOFRAN) 4 MG tablet, Take 1 tablet (4 mg total) by mouth every 8 (eight) hours as needed for nausea or vomiting., Disp: 8 tablet, Rfl: 0 .  pantoprazole (PROTONIX) 40 MG tablet, Take 1 tablet (40 mg total) by mouth daily. In am before food, Disp: 90 tablet, Rfl: 3 .  promethazine (PHENERGAN) 12.5 MG tablet, Take 1 tablet (12.5 mg total) by mouth every 6 (six) hours as needed for nausea or vomiting., Disp: 30 tablet, Rfl: 0 .  promethazine (PHENERGAN) 25 MG tablet, Take 0.5-1 tablets (12.5-25 mg total) by mouth 2 (two) times daily as needed for nausea or vomiting., Disp: 30 tablet, Rfl: 0 .  senna-docusate (SENOKOT-S) 8.6-50 MG tablet, Take 1 tablet by mouth daily as needed for mild constipation., Disp: 90 tablet, Rfl: 1 .  valACYclovir (VALTREX) 1000 MG tablet, Take 1 tablet (1,000 mg total) by mouth 3 (three) times daily., Disp: 21 tablet, Rfl: 0  EXAM:  VITALS per patient if applicable:  GENERAL: alert, oriented, appears well and in no acute distress   PSYCH/NEURO: pleasant and cooperative, + anxiety, speech and thought processing grossly intact Skin: per pt shingles right abdomen to flank   ASSESSMENT AND PLAN:  Discussed the following assessment and plan:  Herpes zoster without  complication - Plan: lidocaine (LIDODERM) 5 %, gabapentin (NEURONTIN) 300 MG capsule qhs x 1 week increase to 2 pills qhs or 1 pill bid after 1 week, valACYclovir (VALTREX) 1000 MG tablet x 1-2 weeks, HYDROmorphone (DILAUDID) 2 MG tablet Bid prn #10 no refills   Post herpetic neuralgia - Plan: lidocaine (LIDODERM) 5 % Pt to call back Friday with BP readings and how shingles doing   Essential hypertension - Plan: olmesartan-hydrochlorothiazide (BENICAR HCT) 20-12.5 MG tablet d/c lis 20 mg qd continue toprol xl 50 mg qd   HM Declines  flu shot  Tdap likely due pt unsure if had 6-7 years ago  Consider pna 23, shingrix and in future if has not had   S/p hysterectomy will ask at f/u if had abnormal pap  Referred to GI need for colonoscopy and possibly EGD with issues. H/o sigmoid resection in past for h/o diverticulitis  referred mammogram   rec smoking cessation smoking < or = 0.5 ppd also using THC     I discussed the assessment and treatment plan with the patient. The patient was provided an opportunity to ask questions and all were answered. The patient agreed with the plan and demonstrated an understanding of the instructions.   The patient was advised to call back or seek an in-person evaluation if the symptoms worsen or if the condition fails to improve as anticipated.  Time spent 25 minutes telephone and medical decision making as above  Delorise Jackson, MD

## 2018-11-16 NOTE — Patient Instructions (Signed)
Shingles    Shingles, which is also known as herpes zoster, is an infection that causes a painful skin rash and fluid-filled blisters. It is caused by a virus.  Shingles only develops in people who:   Have had chickenpox.   Have been given a medicine to protect against chickenpox (have been vaccinated). Shingles is rare in this group.  What are the causes?  Shingles is caused by varicella-zoster virus (VZV). This is the same virus that causes chickenpox. After a person is exposed to VZV, the virus stays in the body in an inactive (dormant) state. Shingles develops if the virus is reactivated. This can happen many years after the first (initial) exposure to VZV. It is not known what causes this virus to be reactivated.  What increases the risk?  People who have had chickenpox or received the chickenpox vaccine are at risk for shingles. Shingles infection is more common in people who:   Are older than age 60.   Have a weakened disease-fighting system (immune system), such as people with:  ? HIV.  ? AIDS.  ? Cancer.   Are taking medicines that weaken the immune system, such as transplant medicines.   Are experiencing a lot of stress.  What are the signs or symptoms?  Early symptoms of this condition include itching, tingling, and pain in an area on your skin. Pain may be described as burning, stabbing, or throbbing.  A few days or weeks after early symptoms start, a painful red rash appears. The rash is usually on one side of the body and has a band-like or belt-like pattern. The rash eventually turns into fluid-filled blisters that break open, change into scabs, and dry up in about 2-3 weeks.  At any time during the infection, you may also develop:   A fever.   Chills.   A headache.   An upset stomach.  How is this diagnosed?  This condition is diagnosed with a skin exam. Skin or fluid samples may be taken from the blisters before a diagnosis is made. These samples are examined under a microscope or sent to  a lab for testing.  How is this treated?  The rash may last for several weeks. There is not a specific cure for this condition. Your health care provider will probably prescribe medicines to help you manage pain, recover more quickly, and avoid long-term problems. Medicines may include:   Antiviral drugs.   Anti-inflammatory drugs.   Pain medicines.   Anti-itching medicines (antihistamines).  If the area involved is on your face, you may be referred to a specialist, such as an eye doctor (ophthalmologist) or an ear, nose, and throat (ENT) doctor (otolaryngologist) to help you avoid eye problems, chronic pain, or disability.  Follow these instructions at home:  Medicines   Take over-the-counter and prescription medicines only as told by your health care provider.   Apply an anti-itch cream or numbing cream to the affected area as told by your health care provider.  Relieving itching and discomfort     Apply cold, wet cloths (cold compresses) to the area of the rash or blisters as told by your health care provider.   Cool baths can be soothing. Try adding baking soda or dry oatmeal to the water to reduce itching. Do not bathe in hot water.  Blister and rash care   Keep your rash covered with a loose bandage (dressing). Wear loose-fitting clothing to help ease the pain of material rubbing against the rash.     Keep your rash and blisters clean by washing the area with mild soap and cool water as told by your health care provider.   Check your rash every day for signs of infection. Check for:  ? More redness, swelling, or pain.  ? Fluid or blood.  ? Warmth.  ? Pus or a bad smell.   Do not scratch your rash or pick at your blisters. To help avoid scratching:  ? Keep your fingernails clean and cut short.  ? Wear gloves or mittens while you sleep, if scratching is a problem.  General instructions   Rest as told by your health care provider.   Keep all follow-up visits as told by your health care provider. This  is important.   Wash your hands often with soap and water. If soap and water are not available, use hand sanitizer. Doing this lowers your chance of getting a bacterial skin infection.   Before your blisters change into scabs, your shingles infection can cause chickenpox in people who have never had it or have never been vaccinated against it. To prevent this from happening, avoid contact with other people, especially:  ? Babies.  ? Pregnant women.  ? Children who have eczema.  ? Elderly people who have transplants.  ? People who have chronic illnesses, such as cancer or AIDS.  Contact a health care provider if:   Your pain is not relieved with prescribed medicines.   Your pain does not get better after the rash heals.   You have signs of infection in the rash area, such as:  ? More redness, swelling, or pain around the rash.  ? Fluid or blood coming from the rash.  ? The rash area feeling warm to the touch.  ? Pus or a bad smell coming from the rash.  Get help right away if:   The rash is on your face or nose.   You have facial pain, pain around your eye area, or loss of feeling on one side of your face.   You have difficulty seeing.   You have ear pain or have ringing in your ear.   You have a loss of taste.   Your condition gets worse.  Summary   Shingles, which is also known as herpes zoster, is an infection that causes a painful skin rash and fluid-filled blisters.   This condition is diagnosed with a skin exam. Skin or fluid samples may be taken from the blisters and examined before the diagnosis is made.   Keep your rash covered with a loose bandage (dressing). Wear loose-fitting clothing to help ease the pain of material rubbing against the rash.   Before your blisters change into scabs, your shingles infection can cause chickenpox in people who have never had it or have never been vaccinated against it.  This information is not intended to replace advice given to you by your health care  provider. Make sure you discuss any questions you have with your health care provider.  Document Released: 06/30/2005 Document Revised: 03/04/2017 Document Reviewed: 03/04/2017  [employer]vier Interactive Patient Education  2019 [employer]

## 2018-11-22 ENCOUNTER — Encounter: Payer: Self-pay | Admitting: Internal Medicine

## 2018-11-22 NOTE — Telephone Encounter (Signed)
Copied from [place] [phone]. Topic: General - Other >> Nov 22, 2018  9:13 AM Sheran Luz wrote: Reason for CRM: Patient inquired if a return to work letter could be sent to her via mychart. Patient has been out of work due to shingles.

## 2018-11-22 NOTE — Telephone Encounter (Signed)
Letter ready in letters tab on her end should be able to see it   [place]

## 2018-11-28 ENCOUNTER — Emergency Department: Payer: Medicare Other

## 2018-11-28 ENCOUNTER — Encounter: Payer: Self-pay | Admitting: Emergency Medicine

## 2018-11-28 ENCOUNTER — Other Ambulatory Visit: Payer: Self-pay

## 2018-11-28 DIAGNOSIS — Z7982 Long term (current) use of aspirin: Secondary | ICD-10-CM | POA: Diagnosis not present

## 2018-11-28 DIAGNOSIS — E119 Type 2 diabetes mellitus without complications: Secondary | ICD-10-CM | POA: Diagnosis not present

## 2018-11-28 DIAGNOSIS — J45909 Unspecified asthma, uncomplicated: Secondary | ICD-10-CM | POA: Insufficient documentation

## 2018-11-28 DIAGNOSIS — J449 Chronic obstructive pulmonary disease, unspecified: Secondary | ICD-10-CM | POA: Diagnosis not present

## 2018-11-28 DIAGNOSIS — I2510 Atherosclerotic heart disease of native coronary artery without angina pectoris: Secondary | ICD-10-CM | POA: Diagnosis not present

## 2018-11-28 DIAGNOSIS — F17210 Nicotine dependence, cigarettes, uncomplicated: Secondary | ICD-10-CM | POA: Diagnosis not present

## 2018-11-28 DIAGNOSIS — Z7902 Long term (current) use of antithrombotics/antiplatelets: Secondary | ICD-10-CM | POA: Diagnosis not present

## 2018-11-28 DIAGNOSIS — Z91040 Latex allergy status: Secondary | ICD-10-CM | POA: Diagnosis not present

## 2018-11-28 DIAGNOSIS — I10 Essential (primary) hypertension: Secondary | ICD-10-CM | POA: Diagnosis not present

## 2018-11-28 DIAGNOSIS — Z79899 Other long term (current) drug therapy: Secondary | ICD-10-CM | POA: Diagnosis not present

## 2018-11-28 DIAGNOSIS — R079 Chest pain, unspecified: Secondary | ICD-10-CM | POA: Insufficient documentation

## 2018-11-28 LAB — CBC
HCT: 42.1 % (ref 36.0–46.0)
Hemoglobin: 13.9 g/dL (ref 12.0–15.0)
MCH: 28.9 pg (ref 26.0–34.0)
MCHC: 33 g/dL (ref 30.0–36.0)
MCV: 87.5 fL (ref 80.0–100.0)
Platelets: 399 10*3/uL (ref 150–400)
RBC: 4.81 MIL/uL (ref 3.87–5.11)
RDW: 14 % (ref 11.5–15.5)
WBC: 16.5 10*3/uL — ABNORMAL HIGH (ref 4.0–10.5)
nRBC: 0 % (ref 0.0–0.2)

## 2018-11-28 LAB — BASIC METABOLIC PANEL
Anion gap: 9 (ref 5–15)
BUN: 22 mg/dL — ABNORMAL HIGH (ref 6–20)
CO2: 25 mmol/L (ref 22–32)
Calcium: 9.2 mg/dL (ref 8.9–10.3)
Chloride: 109 mmol/L (ref 98–111)
Creatinine, Ser: 0.8 mg/dL (ref 0.44–1.00)
GFR calc Af Amer: >60 mL/min (ref >60)
GFR calc non Af Amer: >60 mL/min (ref >60)
Glucose, Bld: 145 mg/dL — ABNORMAL HIGH (ref 70–99)
Potassium: 3.6 mmol/L (ref 3.5–5.1)
Sodium: 143 mmol/L (ref 135–145)

## 2018-11-28 LAB — TROPONIN I: Troponin I: <0.03 ng/mL (ref <0.03)

## 2018-11-28 NOTE — ED Triage Notes (Signed)
Patient states that central chest pain that started about 2 hours ago. Patient states that she took one nitro pill. Patient states that after about 25 minutes she had improvement with her chest pain. Patient states that she diaphoretic with the chest pain and some shortness of breath. Patient denies nausea or vomiting.

## 2018-11-28 NOTE — ED Notes (Signed)
Per ems pt with normal ekg, pt onset of chest pain at 2100. Pt took a ntg with relief per ems, vss per ems.

## 2018-11-29 ENCOUNTER — Emergency Department
Admission: EM | Admit: 2018-11-29 | Discharge: 2018-11-29 | Disposition: A | Discharge status: Home / Self Care | Payer: Medicare Other | Attending: Emergency Medicine | Admitting: Emergency Medicine

## 2018-11-29 DIAGNOSIS — R079 Chest pain, unspecified: Secondary | ICD-10-CM

## 2018-11-29 LAB — TROPONIN I: Troponin I: <0.03 ng/mL (ref <0.03)

## 2018-11-29 MED ORDER — ASPIRIN 81 MG PO CHEW
324.0000 mg | CHEWABLE_TABLET | Freq: Once | ORAL | Status: AC
  Administered 2018-11-29: 06:00:00 324 mg via ORAL
  Filled 2018-11-29: qty 4

## 2018-11-29 NOTE — Discharge Instructions (Addendum)

## 2018-11-29 NOTE — ED Notes (Signed)
Patient was sleeping when this writer entered the room. Patient awakened easily. Patient was snoring. NAD.

## 2018-11-29 NOTE — ED Notes (Signed)
Pt updated on wait time.  

## 2018-11-29 NOTE — ED Provider Notes (Signed)
[facility] Emergency Department Provider Note  ____________________________________________   First MD Initiated Contact with Patient 11/29/18 0400     (approximate)  I have reviewed the triage vital signs and the nursing notes.   HISTORY  Chief Complaint Chest Pain    HPI Shawna Anderson is a 53 y.o. female with extensive medical and chronic pain history as well as a history of coronary artery disease status post stent who presents for evaluation of chest pain.  She says it feels little bit similar to chest pain she has had in the past.  It is worse when she moves around particularly when she moves around her left arm.  It got a little bit better after a nitro pill.  She says that sometimes she is a little bit short of breath and sometimes she has some sweating.  She reiterated that she has had some symptoms like this in the past.  She has been referred to cardiology before but says she has a hard time coming up with a $200 for the co-pay.   She suffers from cyclic vomiting syndrome as well as chronic abdominal pain and chronic back pain.  She describes her symptoms as mild to moderate.  She was sleeping comfortably in the exam room when I checked on her.  She is denying any pain right now.  She also notes that she has a small lump in her right breast that she has not noticed before.        Past Medical History:  Diagnosis Date  . Anxiety   . Asthma   . Bipolar 1 disorder ([place])   . Bipolar disorder ([place])   . CAD (coronary artery disease)    s/p stent BMS OM Cx  . Cervical herniated disc 04/12/2016  . COPD (chronic obstructive pulmonary disease) ([place])   . Depression   . Diabetes mellitus without complication ([place])   . Diverticulitis   . GERD (gastroesophageal reflux disease)   . Glaucoma   . History of blood transfusion   . Hyperlipidemia   . Hypertension   . OSA (obstructive sleep apnea)    not using cpap   . Plantar fasciitis    b/l feet  s/p steroid shots w/o help and left surgery w/o help   . Sleep apnea   . UTI (urinary tract infection)     Patient Active Problem List   Diagnosis Date Noted  . Bipolar disorder ([place]) 12/30/2017  . OSA (obstructive sleep apnea) 12/30/2017  . Constipation 12/30/2017  . Coronary artery disease of native artery of native heart with stable angina pectoris ([place]) 12/30/2017  . Gastroesophageal reflux disease 12/30/2017  . Asthma 12/29/2017  . COPD (chronic obstructive pulmonary disease) ([place]) 12/29/2017  . Anxiety and depression 12/29/2017  . Insomnia 12/29/2017  . Chronic back pain 12/29/2017  . Skin lesion of back 12/29/2017  . CAD S/P CFX PCI 2010 04/15/2016  . Essential hypertension 04/15/2016  . Dyslipidemia 04/15/2016  . Noncompliance with medication regimen 04/15/2016  . Chest pain with moderate risk of acute coronary syndrome 04/12/2016  . Abdominal pain 01/12/2015  . Vomiting 01/12/2015  . Nausea and vomiting 01/12/2015    Past Surgical History:  Procedure Laterality Date  . ABDOMINAL HYSTERECTOMY    . ABDOMINAL SURGERY  1995   Bowel resection.  Marland Kitchen CARDIAC CATHETERIZATION N/A 04/14/2016   Procedure: Left Heart Cath and Coronary Angiography;  Surgeon: Burnell Blanks, MD;  Location: [facility];  Service: Cardiovascular;  Laterality: N/A;  . CARDIAC SURGERY    . CORONARY ANGIOPLASTY WITH STENT PLACEMENT  2010   Drug eluting stent  . OVARIAN CYST REMOVAL      Prior to Admission medications   Medication Sig Start Date End Date Taking? Authorizing Provider  albuterol (VENTOLIN HFA) 108 (90 Base) MCG/ACT inhaler Inhale 1-2 puffs into the lungs every 6 (six) hours as needed for wheezing or shortness of breath. 11/04/18   McLean-Scocuzza, Nino Glow, MD  ALPRAZolam Duanne Moron) 0.25 MG tablet Take 1 tablet (0.25 mg total) by mouth at bedtime as needed for anxiety or sleep. 11/04/18   McLean-Scocuzza, Nino Glow, MD  aspirin EC 81 MG tablet Take 1 tablet (81 mg total) by mouth  daily. 11/04/18   McLean-Scocuzza, Nino Glow, MD  atorvastatin (LIPITOR) 40 MG tablet Take 1 tablet (40 mg total) by mouth daily at 6 PM. 11/04/18   McLean-Scocuzza, Nino Glow, MD  clopidogrel (PLAVIX) 75 MG tablet Take 1 tablet (75 mg total) by mouth daily. 11/04/18   McLean-Scocuzza, Nino Glow, MD  divalproex (DEPAKOTE) 500 MG DR tablet Take 1 tablet (500 mg total) by mouth 2 (two) times daily. 11/04/18   McLean-Scocuzza, Nino Glow, MD  gabapentin (NEURONTIN) 300 MG capsule Take 1 capsule (300 mg total) by mouth at bedtime. If not better after 1 week increase to 2 pills at night 11/16/18   McLean-Scocuzza, Nino Glow, MD  ipratropium-albuterol (DUONEB) 0.5-2.5 (3) MG/3ML SOLN Take 3 mLs by nebulization every 6 (six) hours as needed. 12/29/17   McLean-Scocuzza, Nino Glow, MD  lidocaine (LIDODERM) 5 % Place 1 patch onto the skin daily. Remove & Discard patch within 12 hours or as directed by MD 11/16/18   McLean-Scocuzza, Nino Glow, MD  metoprolol succinate (TOPROL-XL) 50 MG 24 hr tablet Take 1 tablet (50 mg total) by mouth daily. Take with or immediately following a meal. 11/04/18   McLean-Scocuzza, Nino Glow, MD  nitroGLYCERIN (NITROSTAT) 0.4 MG SL tablet Place 1 tablet (0.4 mg total) under the tongue every 5 (five) minutes as needed for chest pain. X 2 call 911 after 3rd dose 11/04/18   McLean-Scocuzza, Nino Glow, MD  olmesartan-hydrochlorothiazide (BENICAR HCT) 20-12.5 MG tablet Take 1 tablet by mouth daily. In am 11/16/18   McLean-Scocuzza, Nino Glow, MD  ondansetron (ZOFRAN) 4 MG tablet Take 1 tablet (4 mg total) by mouth every 8 (eight) hours as needed for nausea or vomiting. 11/07/18   Schuyler Amor, MD  pantoprazole (PROTONIX) 40 MG tablet Take 1 tablet (40 mg total) by mouth daily. In am before food 11/04/18   McLean-Scocuzza, Nino Glow, MD  promethazine (PHENERGAN) 12.5 MG tablet Take 1 tablet (12.5 mg total) by mouth every 6 (six) hours as needed for nausea or vomiting. 11/12/18   Nena Polio, MD  promethazine (PHENERGAN)  25 MG tablet Take 0.5-1 tablets (12.5-25 mg total) by mouth 2 (two) times daily as needed for nausea or vomiting. 11/05/18   McLean-Scocuzza, Nino Glow, MD  senna-docusate (SENOKOT-S) 8.6-50 MG tablet Take 1 tablet by mouth daily as needed for mild constipation. 12/29/17   McLean-Scocuzza, Nino Glow, MD  valACYclovir (VALTREX) 1000 MG tablet Take 1 tablet (1,000 mg total) by mouth 3 (three) times daily. 11/16/18   McLean-Scocuzza, Nino Glow, MD    Allergies Ativan [lorazepam]; Latex; Tape; and Drixoral allergy sinus [dexbromphen-pse-apap er]  Family History  Problem Relation Age of Onset  . CAD Mother   . Depression Mother   . Heart disease Mother   .  Hyperlipidemia Mother   . Hypertension Mother   . CAD Brother   . Depression Brother   . Diabetes Brother   . Heart disease Brother   . Hyperlipidemia Brother   . Heart disease Father   . Alcohol abuse Father     Social History Social History   Tobacco Use  . Smoking status: Current Every Day Smoker    Packs/day: 0.50    Years: 30.00    Pack years: 15.00    Types: Cigarettes  . Smokeless tobacco: Never Used  . Tobacco comment: refused  Substance Use Topics  . Alcohol use: Yes    Comment: once a year  . Drug use: Yes    Frequency: 7.0 times per week    Types: Marijuana    Review of Systems Constitutional: No fever/chills Eyes: No visual changes. ENT: No sore throat. Cardiovascular: Episodic chest pain as described above. Respiratory: Episodic chest pain occasionally associated with the shortness of breath. Gastrointestinal: No abdominal pain.  No nausea, no vomiting.  No diarrhea.  No constipation. Genitourinary: Negative for dysuria. Musculoskeletal: Negative for neck pain.  Negative for back pain. Integumentary: Negative for rash.  Small lump on her right breast that she has not previously noticed. Neurological: Negative for headaches, focal weakness or numbness.   ____________________________________________   PHYSICAL  EXAM:  VITAL SIGNS: ED Triage Vitals  Enc Vitals Group     BP 11/28/18 2200 (!) 149/81     Pulse Rate 11/28/18 2200 74     Resp 11/28/18 2200 20     Temp 11/28/18 2200 99.1 F (37.3 C)     Temp Source 11/28/18 2200 Oral     SpO2 11/28/18 2200 99 %     Weight 11/28/18 2212 81.6 kg (180 lb)     Height 11/28/18 2212 1.6 m (5\' 3" )     Head Circumference --      Peak Flow --      Pain Score 11/28/18 2212 4     Pain Loc --      Pain Edu? --      Excl. in [place]? --     Constitutional: Alert and oriented. Well appearing and in no acute distress. Eyes: Conjunctivae are normal.  Head: Atraumatic. Nose: No congestion/rhinnorhea. Mouth/Throat: Mucous membranes are moist. Neck: No stridor.  No meningeal signs.   Cardiovascular: Normal rate, regular rhythm. Good peripheral circulation. Grossly normal heart sounds. Respiratory: Normal respiratory effort.  No retractions. No audible wheezing. Gastrointestinal: Soft and nontender. No distention.  Musculoskeletal: No lower extremity tenderness nor edema. No gross deformities of extremities. Neurologic:  Normal speech and language. No gross focal neurologic deficits are appreciated.  Skin:  Skin is warm, dry and intact. No rash noted.  The patient showed me a small rubbery nonpainful nodule on the anterior right breast that feels most consistent with a fibroadenoma.  No evidence of infection or cellulitis.   ____________________________________________   LABS (all labs ordered are listed, but only abnormal results are displayed)  Labs Reviewed  BASIC METABOLIC PANEL - Abnormal; Notable for the following components:      Result Value   Glucose, Bld 145 (*)    BUN 22 (*)    All other components within normal limits  CBC - Abnormal; Notable for the following components:   WBC 16.5 (*)    All other components within normal limits  TROPONIN I  TROPONIN I   ____________________________________________  EKG  ED ECG REPORT I, Hinda Kehr, the  attending physician, personally viewed and interpreted this ECG.  Date: 11/28/2018 EKG Time: 21: 53 Rate: 70 Rhythm: normal sinus rhythm QRS Axis: normal Intervals: normal ST/T Wave abnormalities: Non-specific ST segment / T-wave changes, but no clear evidence of acute ischemia. Narrative Interpretation: no definitive evidence of acute ischemia; does not meet STEMI criteria.   ____________________________________________  RADIOLOGY I, Hinda Kehr, personally viewed and evaluated these images (plain radiographs) as part of my medical decision making, as well as reviewing the written report by the radiologist.  ED MD interpretation: No acute abnormalities on chest x-ray.  Official radiology report(s): Dg Chest 2 View  Result Date: 11/28/2018 CLINICAL DATA:  Chest pain EXAM: CHEST - 2 VIEW COMPARISON:  11/16/2017 FINDINGS: The heart size and mediastinal contours are within normal limits. Both lungs are clear. The visualized skeletal structures are unremarkable. IMPRESSION: No acute abnormality of the lungs. Electronically Signed   By: Eddie Candle M.D.   On: 11/28/2018 22:42    ____________________________________________   PROCEDURES   Procedure(s) performed (including Critical Care):  Procedures   ____________________________________________   INITIAL IMPRESSION / MDM / [place] / ED COURSE  As part of my medical decision making, I reviewed the following data within the [place] notes reviewed and incorporated, Labs reviewed , EKG interpreted , Old chart reviewed, Radiograph reviewed , Notes from prior ED visits and [place] Controlled Substance [place] was evaluated in Emergency Department on 11/29/2018 for the symptoms described in the history of present illness. She was evaluated in the context of the global COVID-19 pandemic, which necessitated consideration that the patient might be at risk for  infection with the SARS-CoV-2 virus that causes COVID-19. Institutional protocols and algorithms that pertain to the evaluation of patients at risk for COVID-19 are in a state of rapid change based on information released by regulatory bodies including the CDC and federal and state organizations. These policies and algorithms were followed during the patient's care in the ED.  Some ED evaluations and interventions may be delayed as a result of limited staffing during the pandemic.*  Differential diagnosis includes, but is not limited to, musculoskeletal pain, acute on chronic pain syndrome, ACS, PE, pneumonia.  The patient is well-appearing and in no distress.  She is currently asymptomatic.  Her vital signs are reassuring and within normal limits, there is no concern for COVID-19, her basic metabolic panel is normal, 2 troponins were negative, and she has a mild leukocytosis which is nonspecific of 16.5.  Chest x-ray is clear and EKG is nonischemic.  She has a reassuring EKG and due to high patient volume and pandemic level staffing, she was in the emergency department for nearly 8 hours before I evaluated her.  It is reassuring that she is asymptomatic at this time.  I ordered a full dose aspirin for her and explained to her it is very important that she follow-up with a cardiologist given her history but there is no indication to keep her in the hospital tonight.  She says that she understands.  She will also try to pursue outpatient follow-up regarding the small breast nodule which I believe to be a fibroadenoma.  We talked about this as well.  I gave my usual customary return precautions and she understands and agrees with the plan.         ____________________________________________  FINAL CLINICAL IMPRESSION(S) / ED DIAGNOSES  Final diagnoses:  Chest pain, unspecified type  MEDICATIONS GIVEN DURING THIS VISIT:  Medications  aspirin chewable tablet 324 mg (has no administration in time  range)     ED Discharge Orders    None       Note:  This document was prepared using Dragon voice recognition software and may include unintentional dictation errors.   Hinda Kehr, MD 11/29/18 [phone]

## 2018-12-07 ENCOUNTER — Other Ambulatory Visit: Payer: Self-pay | Admitting: Internal Medicine

## 2018-12-07 DIAGNOSIS — G4700 Insomnia, unspecified: Secondary | ICD-10-CM

## 2018-12-07 MED ORDER — TRAZODONE HCL 150 MG PO TABS: 150.0000 mg | ORAL_TABLET | Freq: Every evening | ORAL | 11 refills | Status: DC | PRN

## 2018-12-14 ENCOUNTER — Other Ambulatory Visit: Payer: Medicare Other

## 2018-12-21 ENCOUNTER — Other Ambulatory Visit: Payer: Medicare Other

## 2018-12-24 ENCOUNTER — Other Ambulatory Visit (INDEPENDENT_AMBULATORY_CARE_PROVIDER_SITE_OTHER): Payer: Medicare Other

## 2018-12-24 ENCOUNTER — Other Ambulatory Visit: Payer: Self-pay | Admitting: Internal Medicine

## 2018-12-24 ENCOUNTER — Other Ambulatory Visit: Payer: Self-pay

## 2018-12-24 ENCOUNTER — Encounter: Payer: Self-pay | Admitting: Internal Medicine

## 2018-12-24 DIAGNOSIS — I10 Essential (primary) hypertension: Secondary | ICD-10-CM | POA: Diagnosis not present

## 2018-12-24 DIAGNOSIS — Z1389 Encounter for screening for other disorder: Secondary | ICD-10-CM

## 2018-12-24 DIAGNOSIS — E559 Vitamin D deficiency, unspecified: Secondary | ICD-10-CM

## 2018-12-24 DIAGNOSIS — I25118 Atherosclerotic heart disease of native coronary artery with other forms of angina pectoris: Secondary | ICD-10-CM

## 2018-12-24 DIAGNOSIS — Z1329 Encounter for screening for other suspected endocrine disorder: Secondary | ICD-10-CM | POA: Diagnosis not present

## 2018-12-24 DIAGNOSIS — R6889 Other general symptoms and signs: Secondary | ICD-10-CM | POA: Diagnosis not present

## 2018-12-24 DIAGNOSIS — Z1159 Encounter for screening for other viral diseases: Secondary | ICD-10-CM

## 2018-12-24 DIAGNOSIS — Z13818 Encounter for screening for other digestive system disorders: Secondary | ICD-10-CM

## 2018-12-24 DIAGNOSIS — Z113 Encounter for screening for infections with a predominantly sexual mode of transmission: Secondary | ICD-10-CM | POA: Diagnosis not present

## 2018-12-24 LAB — VITAMIN D 25 HYDROXY (VIT D DEFICIENCY, FRACTURES): VITD: 18.13 ng/mL — ABNORMAL LOW (ref 30.00–100.00)

## 2018-12-24 LAB — COMPREHENSIVE METABOLIC PANEL
ALT: 27 U/L (ref 0–35)
AST: 14 U/L (ref 0–37)
Albumin: 4.1 g/dL (ref 3.5–5.2)
Alkaline Phosphatase: 96 U/L (ref 39–117)
BUN: 14 mg/dL (ref 6–23)
CO2: 25 mEq/L (ref 19–32)
Calcium: 9 mg/dL (ref 8.4–10.5)
Chloride: 105 mEq/L (ref 96–112)
Creatinine, Ser: 0.72 mg/dL (ref 0.40–1.20)
GFR: 85.18 mL/min (ref >60.00)
Glucose, Bld: 90 mg/dL (ref 70–99)
Potassium: 4.5 mEq/L (ref 3.5–5.1)
Sodium: 139 mEq/L (ref 135–145)
Total Bilirubin: 0.4 mg/dL (ref 0.2–1.2)
Total Protein: 6.5 g/dL (ref 6.0–8.3)

## 2018-12-24 LAB — LIPID PANEL
Cholesterol: 274 mg/dL — ABNORMAL HIGH (ref 0–200)
HDL: 41.1 mg/dL (ref >39.00)
NonHDL: 233.22
Total CHOL/HDL Ratio: 7
Triglycerides: 213 mg/dL — ABNORMAL HIGH (ref 0.0–149.0)
VLDL: 42.6 mg/dL — ABNORMAL HIGH (ref 0.0–40.0)

## 2018-12-24 LAB — CBC WITH DIFFERENTIAL/PLATELET
Basophils Absolute: 0.1 10*3/uL (ref 0.0–0.1)
Basophils Relative: 0.8 % (ref 0.0–3.0)
Eosinophils Absolute: 0.4 10*3/uL (ref 0.0–0.7)
Eosinophils Relative: 3.9 % (ref 0.0–5.0)
HCT: 40.3 % (ref 36.0–46.0)
Hemoglobin: 13.4 g/dL (ref 12.0–15.0)
Lymphocytes Relative: 37.2 % (ref 12.0–46.0)
Lymphs Abs: 3.5 10*3/uL (ref 0.7–4.0)
MCHC: 33.2 g/dL (ref 30.0–36.0)
MCV: 87.9 fl (ref 78.0–100.0)
Monocytes Absolute: 0.6 10*3/uL (ref 0.1–1.0)
Monocytes Relative: 6.4 % (ref 3.0–12.0)
Neutro Abs: 4.9 10*3/uL (ref 1.4–7.7)
Neutrophils Relative %: 51.7 % (ref 43.0–77.0)
Platelets: 386 10*3/uL (ref 150.0–400.0)
RBC: 4.58 Mil/uL (ref 3.87–5.11)
RDW: 14.2 % (ref 11.5–15.5)
WBC: 9.4 10*3/uL (ref 4.0–10.5)

## 2018-12-24 LAB — TSH: TSH: 0.79 u[IU]/mL (ref 0.35–4.50)

## 2018-12-24 LAB — LDL CHOLESTEROL, DIRECT: Direct LDL: 159 mg/dL

## 2018-12-24 MED ORDER — CHOLECALCIFEROL 1.25 MG (50000 UT) PO CAPS: 50000.0000 [IU] | ORAL_CAPSULE | ORAL | 1 refills | Status: DC

## 2018-12-25 LAB — URINALYSIS, ROUTINE W REFLEX MICROSCOPIC
Bacteria, UA: NONE SEEN /HPF
Bilirubin Urine: NEGATIVE
Glucose, UA: NEGATIVE
Hgb urine dipstick: NEGATIVE
Hyaline Cast: NONE SEEN /LPF
Nitrite: NEGATIVE
Specific Gravity, Urine: 1.027 (ref 1.001–1.03)
WBC, UA: NONE SEEN /HPF (ref 0–5)
pH: 6 (ref 5.0–8.0)

## 2018-12-27 ENCOUNTER — Encounter: Payer: Self-pay | Admitting: Internal Medicine

## 2018-12-27 DIAGNOSIS — E785 Hyperlipidemia, unspecified: Secondary | ICD-10-CM | POA: Insufficient documentation

## 2018-12-27 LAB — HEPATITIS B SURFACE ANTIGEN: Hepatitis B Surface Ag: NONREACTIVE

## 2018-12-27 LAB — HEPATITIS C ANTIBODY
Hepatitis C Ab: NONREACTIVE
SIGNAL TO CUT-OFF: 0.06 (ref <1.00)

## 2018-12-27 LAB — HIV ANTIBODY (ROUTINE TESTING W REFLEX): HIV 1&2 Ab, 4th Generation: NONREACTIVE

## 2018-12-27 LAB — HEPATITIS B SURFACE ANTIBODY, QUANTITATIVE: Hepatitis B-Post: 39 m[IU]/mL (ref >=10)

## 2019-01-19 ENCOUNTER — Other Ambulatory Visit
Admission: RE | Admit: 2019-01-19 | Discharge: 2019-01-19 | Disposition: A | Discharge status: Home / Self Care | Payer: Medicare Other | Source: Ambulatory Visit | Attending: Internal Medicine | Admitting: Internal Medicine

## 2019-01-19 ENCOUNTER — Ambulatory Visit (INDEPENDENT_AMBULATORY_CARE_PROVIDER_SITE_OTHER): Payer: Medicare Other | Admitting: Internal Medicine

## 2019-01-19 ENCOUNTER — Other Ambulatory Visit: Payer: Self-pay

## 2019-01-19 ENCOUNTER — Telehealth: Payer: Self-pay | Admitting: Internal Medicine

## 2019-01-19 DIAGNOSIS — R300 Dysuria: Secondary | ICD-10-CM | POA: Insufficient documentation

## 2019-01-19 DIAGNOSIS — M722 Plantar fascial fibromatosis: Secondary | ICD-10-CM

## 2019-01-19 DIAGNOSIS — M549 Dorsalgia, unspecified: Secondary | ICD-10-CM

## 2019-01-19 DIAGNOSIS — I10 Essential (primary) hypertension: Secondary | ICD-10-CM

## 2019-01-19 DIAGNOSIS — R109 Unspecified abdominal pain: Secondary | ICD-10-CM

## 2019-01-19 DIAGNOSIS — M5440 Lumbago with sciatica, unspecified side: Secondary | ICD-10-CM

## 2019-01-19 LAB — URINALYSIS, ROUTINE W REFLEX MICROSCOPIC
Glucose, UA: NEGATIVE mg/dL
Hgb urine dipstick: NEGATIVE
Ketones, ur: 5 mg/dL — AB
Nitrite: NEGATIVE
Protein, ur: 100 mg/dL — AB
Specific Gravity, Urine: 1.026 (ref 1.005–1.030)
pH: 6 (ref 5.0–8.0)

## 2019-01-19 MED ORDER — OLMESARTAN MEDOXOMIL-HCTZ 40-25 MG PO TABS: 1.0000 | ORAL_TABLET | Freq: Every day | ORAL | 1 refills | Status: DC

## 2019-01-19 NOTE — Telephone Encounter (Signed)
Call pt inform when gets CT scan tomorrow to get Xray of her back as well at [place]

## 2019-01-19 NOTE — Progress Notes (Signed)
telephone Note  I connected with Shawna Anderson  on 01/19/19 at  1:40 PM EDT by a telephone and verified that I am speaking with the correct person using two identifiers.  Location patient: home Location provider:work Persons participating in the virtual visit: patient, provider  I discussed the limitations of evaluation and management by telemedicine and the availability of in person appointments. The patient expressed understanding and agreed to proceed.   HPI: 1. Pt c/o b/l flank/back pain worse x 1 week and in area of her "kidneys" it hurts to take a deep breath 10/10 and feels burning in some areas but not areas where she had prior  Shingles. Pain radiates to her feet. She has tried Tylenol for pain w/o relief.  Urine 11/07/18 insig growth and rec reculture and she was on keflex at this time after being seen in the ED/Urgent care. CT renal study 11/07/18 only significant aortic atherosclerosis and prior hysterectomy and no MSK issues noted nor stones and post op changes to sigmoid colon   2. Plantar fascitis and toes hurting she has tried injections in the past and needs help for plantar fascitis.  3. HTN uncontrolled on benicar 20-12.5 hctz and toprol 50 mg xl qd BP 188/92, today 165/87 and another day 200/97 she reports with meds. She reports compliance with meds    ROS: See pertinent positives and negatives per HPI. CV: denies CP, denies pleuritic chest pain  Lungs: denies sob   Past Medical History:  Diagnosis Date  . Anxiety   . Asthma   . Bipolar 1 disorder ([place])   . Bipolar disorder ([place])   . CAD (coronary artery disease)    s/p stent BMS OM Cx  . Cervical herniated disc 04/12/2016  . COPD (chronic obstructive pulmonary disease) ([place])   . Depression   . Diabetes mellitus without complication ([place])   . Diverticulitis   . GERD (gastroesophageal reflux disease)   . Glaucoma   . History of blood transfusion   . Hyperlipidemia   . Hypertension   . OSA (obstructive  sleep apnea)    not using cpap   . Plantar fasciitis    b/l feet s/p steroid shots w/o help and left surgery w/o help   . Sleep apnea   . UTI (urinary tract infection)     Past Surgical History:  Procedure Laterality Date  . ABDOMINAL HYSTERECTOMY    . ABDOMINAL SURGERY  1995   Bowel resection.  Marland Kitchen CARDIAC CATHETERIZATION N/A 04/14/2016   Procedure: Left Heart Cath and Coronary Angiography;  Surgeon: Burnell Blanks, MD;  Location: [facility];  Service: Cardiovascular;  Laterality: N/A;  . CARDIAC SURGERY    . CORONARY ANGIOPLASTY WITH STENT PLACEMENT  2010   Drug eluting stent  . OVARIAN CYST REMOVAL      Family History  Problem Relation Age of Onset  . CAD Mother   . Depression Mother   . Heart disease Mother   . Hyperlipidemia Mother   . Hypertension Mother   . CAD Brother   . Depression Brother   . Diabetes Brother   . Heart disease Brother   . Hyperlipidemia Brother   . Heart disease Father   . Alcohol abuse Father     SOCIAL HX: lives at home   Current Outpatient Medications:  .  albuterol (VENTOLIN HFA) 108 (90 Base) MCG/ACT inhaler, Inhale 1-2 puffs into the lungs every 6 (six) hours as needed for wheezing or shortness of breath., Disp: 1  Inhaler, Rfl: 11 .  ALPRAZolam (XANAX) 0.25 MG tablet, Take 1 tablet (0.25 mg total) by mouth at bedtime as needed for anxiety or sleep., Disp: 30 tablet, Rfl: 2 .  aspirin EC 81 MG tablet, Take 1 tablet (81 mg total) by mouth daily., Disp: 90 tablet, Rfl: 3 .  atorvastatin (LIPITOR) 40 MG tablet, Take 1 tablet (40 mg total) by mouth daily at 6 PM., Disp: 90 tablet, Rfl: 3 .  Cholecalciferol 1.25 MG (50000 UT) capsule, Take 1 capsule (50,000 Units total) by mouth once a week., Disp: 13 capsule, Rfl: 1 .  clopidogrel (PLAVIX) 75 MG tablet, Take 1 tablet (75 mg total) by mouth daily., Disp: 90 tablet, Rfl: 3 .  divalproex (DEPAKOTE) 500 MG DR tablet, Take 1 tablet (500 mg total) by mouth 2 (two) times daily., Disp:  60 tablet, Rfl: 5 .  ipratropium-albuterol (DUONEB) 0.5-2.5 (3) MG/3ML SOLN, Take 3 mLs by nebulization every 6 (six) hours as needed., Disp: 360 mL, Rfl: 11 .  lidocaine (LIDODERM) 5 %, Place 1 patch onto the skin daily. Remove & Discard patch within 12 hours or as directed by MD, Disp: 30 patch, Rfl: 0 .  metoprolol succinate (TOPROL-XL) 50 MG 24 hr tablet, Take 1 tablet (50 mg total) by mouth daily. Take with or immediately following a meal., Disp: 90 tablet, Rfl: 3 .  nitroGLYCERIN (NITROSTAT) 0.4 MG SL tablet, Place 1 tablet (0.4 mg total) under the tongue every 5 (five) minutes as needed for chest pain. X 2 call 911 after 3rd dose, Disp: 30 tablet, Rfl: 5 .  olmesartan-hydrochlorothiazide (BENICAR HCT) 40-25 MG tablet, Take 1 tablet by mouth daily. In am, Disp: 90 tablet, Rfl: 1 .  ondansetron (ZOFRAN) 4 MG tablet, Take 1 tablet (4 mg total) by mouth every 8 (eight) hours as needed for nausea or vomiting., Disp: 8 tablet, Rfl: 0 .  pantoprazole (PROTONIX) 40 MG tablet, Take 1 tablet (40 mg total) by mouth daily. In am before food, Disp: 90 tablet, Rfl: 3 .  promethazine (PHENERGAN) 12.5 MG tablet, Take 1 tablet (12.5 mg total) by mouth every 6 (six) hours as needed for nausea or vomiting., Disp: 30 tablet, Rfl: 0 .  promethazine (PHENERGAN) 25 MG tablet, Take 0.5-1 tablets (12.5-25 mg total) by mouth 2 (two) times daily as needed for nausea or vomiting., Disp: 30 tablet, Rfl: 0 .  senna-docusate (SENOKOT-S) 8.6-50 MG tablet, Take 1 tablet by mouth daily as needed for mild constipation., Disp: 90 tablet, Rfl: 1 .  traZODone (DESYREL) 150 MG tablet, Take 1 tablet (150 mg total) by mouth at bedtime as needed for sleep., Disp: 30 tablet, Rfl: 11 .  valACYclovir (VALTREX) 1000 MG tablet, Take 1 tablet (1,000 mg total) by mouth 3 (three) times daily., Disp: 21 tablet, Rfl: 0  EXAM:  VITALS per patient if applicable:  GENERAL: alert, oriented, appears well and in no acute distress  PSYCH/NEURO:  pleasant and cooperative, no obvious depression or anxiety, speech and thought processing grossly intact  Skin: no further shingles on skin  ASSESSMENT AND PLAN:  Discussed the following assessment and plan:  Low back pain with sciatica, sciatica laterality unspecified, unspecified back pain laterality, unspecified chronicity - Plan: DG Lumbar Spine Complete  Abdominal/flank pain, and dysuria - Plan: CT Abdomen Pelvis W Contrast r/o infection/pylenephritis, constipation or other etiology of flank/abdominal pain  check UA and culture  reviewed CMET CBC 12/2018 normal   Essential hypertension - Plan: olmesartan-hydrochlorothiazide (BENICAR HCT) 40-25 MG tablet doubled dose  Take toprol 50 mg xl qd  -pt to let me know in 1-2 weeks how BP is doing  If BP still not controlled consider norvasc or spironolactone in the future   Plantar fasciitis - Plan: Ambulatory referral to Podiatry  HM Declines flu shot  Tdap likely due pt unsure if had 6-7 years ago  Consider pna 23, shingrix and in future if has not had   S/p hysterectomy will ask at f/u if had abnormal pap  Referred to GI need for colonoscopy and possibly EGD with issues. H/o sigmoid resection in past for h/o diverticulitis  referred mammogram has not scheduled yet   rec smoking cessation smoking < or = 0.5 ppd also using THC      I discussed the assessment and treatment plan with the patient. The patient was provided an opportunity to ask questions and all were answered. The patient agreed with the plan and demonstrated an understanding of the instructions.   The patient was advised to call back or seek an in-person evaluation if the symptoms worsen or if the condition fails to improve as anticipated.  Time spent 25 minutes  Delorise Jackson, MD

## 2019-01-19 NOTE — Patient Instructions (Addendum)
Please let me know how your blood pressure is doing in 1 week    Plantar Fasciitis  Plantar fasciitis is a painful foot condition that affects the heel. It occurs when the band of tissue that connects the toes to the heel bone (plantar fascia) becomes irritated. This can happen as the result of exercising too much or doing other repetitive activities (overuse injury). The pain from plantar fasciitis can range from mild irritation to severe pain that makes it difficult to walk or move. The pain is usually worse in the morning after sleeping, or after sitting or lying down for a while. Pain may also be worse after long periods of walking or standing. What are the causes? This condition may be caused by:  Standing for long periods of time.  Wearing shoes that do not have good arch support.  Doing activities that put stress on joints (high-impact activities), including running, aerobics, and ballet.  Being overweight.  An abnormal way of walking (gait).  Tight muscles in the back of your lower leg (calf).  High arches in your feet.  Starting a new athletic activity. What are the signs or symptoms? The main symptom of this condition is heel pain. Pain may:  Be worse with first steps after a time of rest, especially in the morning after sleeping or after you have been sitting or lying down for a while.  Be worse after long periods of standing still.  Decrease after 30-45 minutes of activity, such as gentle walking. How is this diagnosed? This condition may be diagnosed based on your medical history and your symptoms. Your health care provider may ask questions about your activity level. Your health care provider will do a physical exam to check for:  A tender area on the bottom of your foot.  A high arch in your foot.  Pain when you move your foot.  Difficulty moving your foot. You may have imaging tests to confirm the diagnosis, such as:  X-rays.  Ultrasound.  MRI. How is  this treated? Treatment for plantar fasciitis depends on how severe your condition is. Treatment may include:  Rest, ice, applying pressure (compression), and raising the affected foot (elevation). This may be called RICE therapy. Your health care provider may recommend RICE therapy along with over-the-counter pain medicines to manage your pain.  Exercises to stretch your calves and your plantar fascia.  A splint that holds your foot in a stretched, upward position while you sleep (night splint).  Physical therapy to relieve symptoms and prevent problems in the future.  Injections of steroid medicine (cortisone) to relieve pain and inflammation.  Stimulating your plantar fascia with electrical impulses (extracorporeal shock wave therapy). This is usually the last treatment option before surgery.  Surgery, if other treatments have not worked after 12 months. Follow these instructions at home:  Managing pain, stiffness, and swelling  If directed, put ice on the painful area: ? Put ice in a plastic bag, or use a frozen bottle of water. ? Place a towel between your skin and the bag or bottle. ? Roll the bottom of your foot over the bag or bottle. ? Do this for 20 minutes, 2-3 times a day.  Wear athletic shoes that have air-sole or gel-sole cushions, or try wearing soft shoe inserts that are designed for plantar fasciitis.  Raise (elevate) your foot above the level of your heart while you are sitting or lying down. Activity  Avoid activities that cause pain. Ask your health care  provider what activities are safe for you.  Do physical therapy exercises and stretches as told by your health care provider.  Try activities and forms of exercise that are easier on your joints (low-impact). Examples include swimming, water aerobics, and biking. General instructions  Take over-the-counter and prescription medicines only as told by your health care provider.  Wear a night splint while  sleeping, if told by your health care provider. Loosen the splint if your toes tingle, become numb, or turn cold and blue.  Maintain a healthy weight, or work with your health care provider to lose weight as needed.  Keep all follow-up visits as told by your health care provider. This is important. Contact a health care provider if you:  Have symptoms that do not go away after caring for yourself at home.  Have pain that gets worse.  Have pain that affects your ability to move or do your daily activities. Summary  Plantar fasciitis is a painful foot condition that affects the heel. It occurs when the band of tissue that connects the toes to the heel bone (plantar fascia) becomes irritated.  The main symptom of this condition is heel pain that may be worse after exercising too much or standing still for a long time.  Treatment varies, but it usually starts with rest, ice, compression, and elevation (RICE therapy) and over-the-counter medicines to manage pain. This information is not intended to replace advice given to you by your health care provider. Make sure you discuss any questions you have with your health care provider. Document Released: 03/25/2001 Document Revised: 06/12/2017 Document Reviewed: 04/27/2017 Elsevier Patient Education  2020 [place].  Acute Back Pain, Adult Acute back pain is sudden and usually short-lived. It is often caused by an injury to the muscles and tissues in the back. The injury may result from:  A muscle or ligament getting overstretched or torn (strained). Ligaments are tissues that connect bones to each other. Lifting something improperly can cause a back strain.  Wear and tear (degeneration) of the spinal disks. Spinal disks are circular tissue that provides cushioning between the bones of the spine (vertebrae).  Twisting motions, such as while playing sports or doing yard work.  A hit to the back.  Arthritis. You may have a physical exam,  lab tests, and imaging tests to find the cause of your pain. Acute back pain usually goes away with rest and home care. Follow these instructions at home: Managing pain, stiffness, and swelling  Take over-the-counter and prescription medicines only as told by your health care provider.  Your health care provider may recommend applying ice during the first 24-48 hours after your pain starts. To do this: ? Put ice in a plastic bag. ? Place a towel between your skin and the bag. ? Leave the ice on for 20 minutes, 2-3 times a day.  If directed, apply heat to the affected area as often as told by your health care provider. Use the heat source that your health care provider recommends, such as a moist heat pack or a heating pad. ? Place a towel between your skin and the heat source. ? Leave the heat on for 20-30 minutes. ? Remove the heat if your skin turns bright red. This is especially important if you are unable to feel pain, heat, or cold. You have a greater risk of getting burned. Activity   Do not stay in bed. Staying in bed for more than 1-2 days can delay your recovery.  Sit up and stand up straight. Avoid leaning forward when you sit, or hunching over when you stand. ? If you work at a desk, sit close to it so you do not need to lean over. Keep your chin tucked in. Keep your neck drawn back, and keep your elbows bent at a right angle. Your arms should look like the letter "L." ? Sit high and close to the steering wheel when you drive. Add lower back (lumbar) support to your car seat, if needed.  Take short walks on even surfaces as soon as you are able. Try to increase the length of time you walk each day.  Do not sit, drive, or stand in one place for more than 30 minutes at a time. Sitting or standing for long periods of time can put stress on your back.  Do not drive or use heavy machinery while taking prescription pain medicine.  Use proper lifting techniques. When you bend and  lift, use positions that put less stress on your back: ? [place] your knees. ? Keep the load close to your body. ? Avoid twisting.  Exercise regularly as told by your health care provider. Exercising helps your back heal faster and helps prevent back injuries by keeping muscles strong and flexible.  Work with a physical therapist to make a safe exercise program, as recommended by your health care provider. Do any exercises as told by your physical therapist. Lifestyle  Maintain a healthy weight. Extra weight puts stress on your back and makes it difficult to have good posture.  Avoid activities or situations that make you feel anxious or stressed. Stress and anxiety increase muscle tension and can make back pain worse. Learn ways to manage anxiety and stress, such as through exercise. General instructions  Sleep on a firm mattress in a comfortable position. Try lying on your side with your knees slightly bent. If you lie on your back, put a pillow under your knees.  Follow your treatment plan as told by your health care provider. This may include: ? Cognitive or behavioral therapy. ? Acupuncture or massage therapy. ? Meditation or yoga. Contact a health care provider if:  You have pain that is not relieved with rest or medicine.  You have increasing pain going down into your legs or buttocks.  Your pain does not improve after 2 weeks.  You have pain at night.  You lose weight without trying.  You have a fever or chills. Get help right away if:  You develop new bowel or bladder control problems.  You have unusual weakness or numbness in your arms or legs.  You develop nausea or vomiting.  You develop abdominal pain.  You feel faint. Summary  Acute back pain is sudden and usually short-lived.  Use proper lifting techniques. When you bend and lift, use positions that put less stress on your back.  Take over-the-counter and prescription medicines and apply heat or ice as  directed by your health care provider. This information is not intended to replace advice given to you by your health care provider. Make sure you discuss any questions you have with your health care provider. Document Released: 06/30/2005 Document Revised: 10/19/2018 Document Reviewed: 02/11/2017 Elsevier Patient Education  [place].  Low-Sodium Eating Plan Sodium, which is an element that makes up salt, helps you maintain a healthy balance of fluids in your body. Too much sodium can increase your blood pressure and cause fluid and waste to be held in your body.  Your health care provider or dietitian may recommend following this plan if you have high blood pressure (hypertension), kidney disease, liver disease, or heart failure. Eating less sodium can help lower your blood pressure, reduce swelling, and protect your heart, liver, and kidneys. What are tips for following this plan? General guidelines  Most people on this plan should limit their sodium intake to 1,500-2,000 mg (milligrams) of sodium each day. Reading food labels   The Nutrition Facts label lists the amount of sodium in one serving of the food. If you eat more than one serving, you must multiply the listed amount of sodium by the number of servings.  Choose foods with less than 140 mg of sodium per serving.  Avoid foods with 300 mg of sodium or more per serving. Shopping  Look for lower-sodium products, often labeled as "low-sodium" or "no salt added."  Always check the sodium content even if foods are labeled as "unsalted" or "no salt added".  Buy fresh foods. ? Avoid canned foods and premade or frozen meals. ? Avoid canned, cured, or processed meats  Buy breads that have less than 80 mg of sodium per slice. Cooking  Eat more home-cooked food and less restaurant, buffet, and fast food.  Avoid adding salt when cooking. Use salt-free seasonings or herbs instead of table salt or sea salt. Check with your  health care provider or pharmacist before using salt substitutes.  Cook with plant-based oils, such as canola, sunflower, or olive oil. Meal planning  When eating at a restaurant, ask that your food be prepared with less salt or no salt, if possible.  Avoid foods that contain MSG (monosodium glutamate). MSG is sometimes added to [place] food, bouillon, and some canned foods. What foods are recommended? The items listed may not be a complete list. Talk with your dietitian about what dietary choices are best for you. Grains Low-sodium cereals, including oats, puffed wheat and rice, and shredded wheat. Low-sodium crackers. Unsalted rice. Unsalted pasta. Low-sodium bread. Whole-grain breads and whole-grain pasta. Vegetables Fresh or frozen vegetables. "No salt added" canned vegetables. "No salt added" tomato sauce and paste. Low-sodium or reduced-sodium tomato and vegetable juice. Fruits Fresh, frozen, or canned fruit. Fruit juice. Meats and other protein foods Fresh or frozen (no salt added) meat, poultry, seafood, and fish. Low-sodium canned tuna and salmon. Unsalted nuts. Dried peas, beans, and lentils without added salt. Unsalted canned beans. Eggs. Unsalted nut butters. Dairy Milk. Soy milk. Cheese that is naturally low in sodium, such as ricotta cheese, fresh mozzarella, or Swiss cheese Low-sodium or reduced-sodium cheese. Cream cheese. Yogurt. Fats and oils Unsalted butter. Unsalted margarine with no trans fat. Vegetable oils such as canola or olive oils. Seasonings and other foods Fresh and dried herbs and spices. Salt-free seasonings. Low-sodium mustard and ketchup. Sodium-free salad dressing. Sodium-free light mayonnaise. Fresh or refrigerated horseradish. Lemon juice. Vinegar. Homemade, reduced-sodium, or low-sodium soups. Unsalted popcorn and pretzels. Low-salt or salt-free chips. What foods are not recommended? The items listed may not be a complete list. Talk with your dietitian  about what dietary choices are best for you. Grains Instant hot cereals. Bread stuffing, pancake, and biscuit mixes. Croutons. Seasoned rice or pasta mixes. Noodle soup cups. Boxed or frozen macaroni and cheese. Regular salted crackers. Self-rising flour. Vegetables Sauerkraut, pickled vegetables, and relishes. Olives. [place] fries. Onion rings. Regular canned vegetables (not low-sodium or reduced-sodium). Regular canned tomato sauce and paste (not low-sodium or reduced-sodium). Regular tomato and vegetable juice (not low-sodium or reduced-sodium). Frozen  vegetables in sauces. Meats and other protein foods Meat or fish that is salted, canned, smoked, spiced, or pickled. Bacon, ham, sausage, hotdogs, corned beef, chipped beef, packaged lunch meats, salt pork, jerky, pickled herring, anchovies, regular canned tuna, sardines, salted nuts. Dairy Processed cheese and cheese spreads. Cheese curds. Blue cheese. Feta cheese. String cheese. Regular cottage cheese. Buttermilk. Canned milk. Fats and oils Salted butter. Regular margarine. Ghee. Bacon fat. Seasonings and other foods Onion salt, garlic salt, seasoned salt, table salt, and sea salt. Canned and packaged gravies. Worcestershire sauce. Tartar sauce. Barbecue sauce. Teriyaki sauce. Soy sauce, including reduced-sodium. Steak sauce. Fish sauce. Oyster sauce. Cocktail sauce. Horseradish that you find on the shelf. Regular ketchup and mustard. Meat flavorings and tenderizers. Bouillon cubes. Hot sauce and Tabasco sauce. Premade or packaged marinades. Premade or packaged taco seasonings. Relishes. Regular salad dressings. Salsa. Potato and tortilla chips. Corn chips and puffs. Salted popcorn and pretzels. Canned or dried soups. Pizza. Frozen entrees and pot pies. Summary  Eating less sodium can help lower your blood pressure, reduce swelling, and protect your heart, liver, and kidneys.  Most people on this plan should limit their sodium intake to  1,500-2,000 mg (milligrams) of sodium each day.  Canned, boxed, and frozen foods are high in sodium. Restaurant foods, fast foods, and pizza are also very high in sodium. You also get sodium by adding salt to food.  Try to cook at home, eat more fresh fruits and vegetables, and eat less fast food, canned, processed, or prepared foods. This information is not intended to replace advice given to you by your health care provider. Make sure you discuss any questions you have with your health care provider. Document Released: 12/20/2001 Document Revised: 06/12/2017 Document Reviewed: 06/23/2016 Elsevier Patient Education  2020 [employer].  Hypertension, Adult High blood pressure (hypertension) is when the force of blood pumping through the arteries is too strong. The arteries are the blood vessels that carry blood from the heart throughout the body. Hypertension forces the heart to work harder to pump blood and may cause arteries to become narrow or stiff. Untreated or uncontrolled hypertension can cause a heart attack, heart failure, a stroke, kidney disease, and other problems. A blood pressure reading consists of a higher number over a lower number. Ideally, your blood pressure should be below 120/80. The first ("top") number is called the systolic pressure. It is a measure of the pressure in your arteries as your heart beats. The second ("bottom") number is called the diastolic pressure. It is a measure of the pressure in your arteries as the heart relaxes. What are the causes? The exact cause of this condition is not known. There are some conditions that result in or are related to high blood pressure. What increases the risk? Some risk factors for high blood pressure are under your control. The following factors may make you more likely to develop this condition:  Smoking.  Having type 2 diabetes mellitus, high cholesterol, or both.  Not getting enough exercise or physical activity.  Being  overweight.  Having too much fat, sugar, calories, or salt (sodium) in your diet.  Drinking too much alcohol. Some risk factors for high blood pressure may be difficult or impossible to change. Some of these factors include:  Having chronic kidney disease.  Having a family history of high blood pressure.  Age. Risk increases with age.  Race. You may be at higher risk if you are African American.  Gender. Men are at higher  risk than women before age 18. After age 53, women are at higher risk than men.  Having obstructive sleep apnea.  Stress. What are the signs or symptoms? High blood pressure may not cause symptoms. Very high blood pressure (hypertensive crisis) may cause:  Headache.  Anxiety.  Shortness of breath.  Nosebleed.  Nausea and vomiting.  Vision changes.  Severe chest pain.  Seizures. How is this diagnosed? This condition is diagnosed by measuring your blood pressure while you are seated, with your arm resting on a flat surface, your legs uncrossed, and your feet flat on the floor. The cuff of the blood pressure monitor will be placed directly against the skin of your upper arm at the level of your heart. It should be measured at least twice using the same arm. Certain conditions can cause a difference in blood pressure between your right and left arms. Certain factors can cause blood pressure readings to be lower or higher than normal for a short period of time:  When your blood pressure is higher when you are in a health care provider's office than when you are at home, this is called white coat hypertension. Most people with this condition do not need medicines.  When your blood pressure is higher at home than when you are in a health care provider's office, this is called masked hypertension. Most people with this condition may need medicines to control blood pressure. If you have a high blood pressure reading during one visit or you have normal blood  pressure with other risk factors, you may be asked to:  Return on a different day to have your blood pressure checked again.  Monitor your blood pressure at home for 1 week or longer. If you are diagnosed with hypertension, you may have other blood or imaging tests to help your health care provider understand your overall risk for other conditions. How is this treated? This condition is treated by making healthy lifestyle changes, such as eating healthy foods, exercising more, and reducing your alcohol intake. Your health care provider may prescribe medicine if lifestyle changes are not enough to get your blood pressure under control, and if:  Your systolic blood pressure is above 130.  Your diastolic blood pressure is above 80. Your personal target blood pressure may vary depending on your medical conditions, your age, and other factors. Follow these instructions at home: Eating and drinking   Eat a diet that is high in fiber and potassium, and low in sodium, added sugar, and fat. An example eating plan is called the DASH (Dietary Approaches to Stop Hypertension) diet. To eat this way: ? Eat plenty of fresh fruits and vegetables. Try to fill one half of your plate at each meal with fruits and vegetables. ? Eat whole grains, such as whole-wheat pasta, brown rice, or whole-grain bread. Fill about one fourth of your plate with whole grains. ? Eat or drink low-fat dairy products, such as skim milk or low-fat yogurt. ? Avoid fatty cuts of meat, processed or cured meats, and poultry with skin. Fill about one fourth of your plate with lean proteins, such as fish, chicken without skin, beans, eggs, or tofu. ? Avoid pre-made and processed foods. These tend to be higher in sodium, added sugar, and fat.  Reduce your daily sodium intake. Most people with hypertension should eat less than 1,500 mg of sodium a day.  Do not drink alcohol if: ? Your health care provider tells you not to drink. ? You are  pregnant, may be pregnant, or are planning to become pregnant.  If you drink alcohol: ? Limit how much you use to:  0-1 drink a day for women.  0-2 drinks a day for men. ? Be aware of how much alcohol is in your drink. In the [place] one drink equals one 12 oz bottle of beer (355 mL), one 5 oz glass of wine (148 mL), or one 1 oz glass of hard liquor (44 mL). Lifestyle   Work with your health care provider to maintain a healthy body weight or to lose weight. Ask what an ideal weight is for you.  Get at least 30 minutes of exercise most days of the week. Activities may include walking, swimming, or biking.  Include exercise to strengthen your muscles (resistance exercise), such as Pilates or lifting weights, as part of your weekly exercise routine. Try to do these types of exercises for 30 minutes at least 3 days a week.  Do not use any products that contain nicotine or tobacco, such as cigarettes, e-cigarettes, and chewing tobacco. If you need help quitting, ask your health care provider.  Monitor your blood pressure at home as told by your health care provider.  Keep all follow-up visits as told by your health care provider. This is important. Medicines  Take over-the-counter and prescription medicines only as told by your health care provider. Follow directions carefully. Blood pressure medicines must be taken as prescribed.  Do not skip doses of blood pressure medicine. Doing this puts you at risk for problems and can make the medicine less effective.  Ask your health care provider about side effects or reactions to medicines that you should watch for. Contact a health care provider if you:  Think you are having a reaction to a medicine you are taking.  Have headaches that keep coming back (recurring).  Feel dizzy.  Have swelling in your ankles.  Have trouble with your vision. Get help right away if you:  Develop a severe headache or confusion.  Have unusual weakness or  numbness.  Feel faint.  Have severe pain in your chest or abdomen.  Vomit repeatedly.  Have trouble breathing. Summary  Hypertension is when the force of blood pumping through your arteries is too strong. If this condition is not controlled, it may put you at risk for serious complications.  Your personal target blood pressure may vary depending on your medical conditions, your age, and other factors. For most people, a normal blood pressure is less than 120/80.  Hypertension is treated with lifestyle changes, medicines, or a combination of both. Lifestyle changes include losing weight, eating a healthy, low-sodium diet, exercising more, and limiting alcohol. This information is not intended to replace advice given to you by your health care provider. Make sure you discuss any questions you have with your health care provider. Document Released: 06/30/2005 Document Revised: 03/10/2018 Document Reviewed: 03/10/2018 Elsevier Patient Education  2020 [employer].

## 2019-01-20 ENCOUNTER — Telehealth: Payer: Self-pay

## 2019-01-20 ENCOUNTER — Ambulatory Visit
Admission: RE | Admit: 2019-01-20 | Discharge: 2019-01-20 | Disposition: A | Discharge status: Home / Self Care | Payer: Medicare Other | Source: Home / Self Care | Attending: Internal Medicine | Admitting: Internal Medicine

## 2019-01-20 ENCOUNTER — Other Ambulatory Visit: Payer: Self-pay | Admitting: Internal Medicine

## 2019-01-20 ENCOUNTER — Ambulatory Visit
Admission: RE | Admit: 2019-01-20 | Discharge: 2019-01-20 | Disposition: A | Discharge status: Home / Self Care | Payer: Medicare Other | Source: Ambulatory Visit | Attending: Internal Medicine | Admitting: Internal Medicine

## 2019-01-20 ENCOUNTER — Other Ambulatory Visit: Payer: Self-pay

## 2019-01-20 DIAGNOSIS — M549 Dorsalgia, unspecified: Secondary | ICD-10-CM

## 2019-01-20 DIAGNOSIS — M5440 Lumbago with sciatica, unspecified side: Secondary | ICD-10-CM

## 2019-01-20 DIAGNOSIS — R109 Unspecified abdominal pain: Secondary | ICD-10-CM

## 2019-01-20 DIAGNOSIS — M545 Low back pain, unspecified: Secondary | ICD-10-CM

## 2019-01-20 DIAGNOSIS — M546 Pain in thoracic spine: Secondary | ICD-10-CM | POA: Diagnosis not present

## 2019-01-20 LAB — URINE CULTURE: Culture: <10000 — AB

## 2019-01-20 MED ORDER — CYCLOBENZAPRINE HCL 5 MG PO TABS: 5.0000 mg | ORAL_TABLET | Freq: Every evening | ORAL | 0 refills | Status: DC | PRN

## 2019-01-20 MED ORDER — IOHEXOL 300 MG/ML  SOLN
100.0000 mL | Freq: Once | INTRAMUSCULAR | Status: AC | PRN
  Administered 2019-01-20: 100 mL via INTRAVENOUS

## 2019-01-20 NOTE — Telephone Encounter (Signed)
Copied from [place] [id number]. Topic: General - Other >> Jan 20, 2019 10:28 AM Keene Breath wrote: Reason for CRM: Patient called to inform the doctor of her BP readings this morning, 7:40am 152/102  Please advise and call patient back at [phone]

## 2019-01-20 NOTE — Telephone Encounter (Signed)
Called and informed patient when getting CT scan get a xray of back order is in.  Kellon Chalk,cma

## 2019-02-18 ENCOUNTER — Other Ambulatory Visit: Payer: Self-pay

## 2019-02-18 ENCOUNTER — Encounter: Payer: Self-pay | Admitting: Internal Medicine

## 2019-02-18 ENCOUNTER — Ambulatory Visit (INDEPENDENT_AMBULATORY_CARE_PROVIDER_SITE_OTHER): Payer: Medicare Other | Admitting: Internal Medicine

## 2019-02-18 VITALS — Ht 62.99 in | Wt 180.0 lb

## 2019-02-18 DIAGNOSIS — R1013 Epigastric pain: Secondary | ICD-10-CM

## 2019-02-18 DIAGNOSIS — R300 Dysuria: Secondary | ICD-10-CM

## 2019-02-18 DIAGNOSIS — K5900 Constipation, unspecified: Secondary | ICD-10-CM

## 2019-02-18 DIAGNOSIS — R7303 Prediabetes: Secondary | ICD-10-CM

## 2019-02-18 DIAGNOSIS — R079 Chest pain, unspecified: Secondary | ICD-10-CM | POA: Diagnosis not present

## 2019-02-18 DIAGNOSIS — I10 Essential (primary) hypertension: Secondary | ICD-10-CM

## 2019-02-18 MED ORDER — SENNOSIDES-DOCUSATE SODIUM 8.6-50 MG PO TABS: 1.0000 | ORAL_TABLET | Freq: Every day | ORAL | 1 refills | Status: DC | PRN

## 2019-02-18 NOTE — Progress Notes (Signed)
Telephone Note  I connected with [facility]  on 02/18/19 at  3:20 PM EDT by telephone and verified that I am speaking with the correct person using two identifiers.  Location patient: home Location provider:work or home office Persons participating in the virtual visit: patient, provider  I discussed the limitations of evaluation and management by telemedicine and the availability of in person appointments. The patient expressed understanding and agreed to proceed.   HPI: 1. HTN BP variable she reports missing doses of meds  benicar 4-25 and toprol 50 mg qd BP sbp 125-160s  2. C/o abdominal pain bloating at the top of abdomen in the middle worse with food after eating x 10 minutes c/o nausea and gas and bubbling/gurgling in stomach and she has been constipated she cant afford to go to GI due to cost  -she would like a refill of senna S prn constipation  3. H/o CAD s/p stent c/o intermittent chest pain w walking and pain radiates down arms she cant afford copay for cardiology at this time   ROS: See pertinent positives and negatives per HPI.  Past Medical History:  Diagnosis Date  . Anxiety   . Asthma   . Bipolar 1 disorder ([place])   . Bipolar disorder ([place])   . CAD (coronary artery disease)    s/p stent BMS OM Cx  . Cervical herniated disc 04/12/2016  . COPD (chronic obstructive pulmonary disease) ([place])   . Depression   . Diabetes mellitus without complication ([place])   . Diverticulitis   . GERD (gastroesophageal reflux disease)   . Glaucoma   . History of blood transfusion   . Hyperlipidemia   . Hypertension   . OSA (obstructive sleep apnea)    not using cpap   . Plantar fasciitis    b/l feet s/p steroid shots w/o help and left surgery w/o help   . Sleep apnea   . UTI (urinary tract infection)     Past Surgical History:  Procedure Laterality Date  . ABDOMINAL HYSTERECTOMY    . ABDOMINAL SURGERY  1995   Bowel resection.  Marland Kitchen CARDIAC CATHETERIZATION N/A 04/14/2016   Procedure: Left Heart Cath and Coronary Angiography;  Surgeon: Burnell Blanks, MD;  Location: [facility];  Service: Cardiovascular;  Laterality: N/A;  . CARDIAC SURGERY    . CORONARY ANGIOPLASTY WITH STENT PLACEMENT  2010   Drug eluting stent  . OVARIAN CYST REMOVAL      Family History  Problem Relation Age of Onset  . CAD Mother   . Depression Mother   . Heart disease Mother   . Hyperlipidemia Mother   . Hypertension Mother   . CAD Brother   . Depression Brother   . Diabetes Brother   . Heart disease Brother   . Hyperlipidemia Brother   . Heart disease Father   . Alcohol abuse Father     SOCIAL HX: lives alone    Current Outpatient Medications:  .  albuterol (VENTOLIN HFA) 108 (90 Base) MCG/ACT inhaler, Inhale 1-2 puffs into the lungs every 6 (six) hours as needed for wheezing or shortness of breath., Disp: 1 Inhaler, Rfl: 11 .  ALPRAZolam (XANAX) 0.25 MG tablet, Take 1 tablet (0.25 mg total) by mouth at bedtime as needed for anxiety or sleep., Disp: 30 tablet, Rfl: 2 .  aspirin EC 81 MG tablet, Take 1 tablet (81 mg total) by mouth daily., Disp: 90 tablet, Rfl: 3 .  atorvastatin (LIPITOR) 40 MG tablet, Take 1  tablet (40 mg total) by mouth daily at 6 PM., Disp: 90 tablet, Rfl: 3 .  clopidogrel (PLAVIX) 75 MG tablet, Take 1 tablet (75 mg total) by mouth daily., Disp: 90 tablet, Rfl: 3 .  cyclobenzaprine (FLEXERIL) 5 MG tablet, Take 1 tablet (5 mg total) by mouth at bedtime as needed for muscle spasms., Disp: 30 tablet, Rfl: 0 .  divalproex (DEPAKOTE) 500 MG DR tablet, Take 1 tablet (500 mg total) by mouth 2 (two) times daily., Disp: 60 tablet, Rfl: 5 .  ipratropium-albuterol (DUONEB) 0.5-2.5 (3) MG/3ML SOLN, Take 3 mLs by nebulization every 6 (six) hours as needed., Disp: 360 mL, Rfl: 11 .  lidocaine (LIDODERM) 5 %, Place 1 patch onto the skin daily. Remove & Discard patch within 12 hours or as directed by MD, Disp: 30 patch, Rfl: 0 .  metoprolol succinate  (TOPROL-XL) 50 MG 24 hr tablet, Take 1 tablet (50 mg total) by mouth daily. Take with or immediately following a meal., Disp: 90 tablet, Rfl: 3 .  nitroGLYCERIN (NITROSTAT) 0.4 MG SL tablet, Place 1 tablet (0.4 mg total) under the tongue every 5 (five) minutes as needed for chest pain. X 2 call 911 after 3rd dose, Disp: 30 tablet, Rfl: 5 .  olmesartan-hydrochlorothiazide (BENICAR HCT) 40-25 MG tablet, Take 1 tablet by mouth daily. In am, Disp: 90 tablet, Rfl: 1 .  ondansetron (ZOFRAN) 4 MG tablet, Take 1 tablet (4 mg total) by mouth every 8 (eight) hours as needed for nausea or vomiting., Disp: 8 tablet, Rfl: 0 .  pantoprazole (PROTONIX) 40 MG tablet, Take 1 tablet (40 mg total) by mouth daily. In am before food, Disp: 90 tablet, Rfl: 3 .  promethazine (PHENERGAN) 12.5 MG tablet, Take 1 tablet (12.5 mg total) by mouth every 6 (six) hours as needed for nausea or vomiting., Disp: 30 tablet, Rfl: 0 .  promethazine (PHENERGAN) 25 MG tablet, Take 0.5-1 tablets (12.5-25 mg total) by mouth 2 (two) times daily as needed for nausea or vomiting., Disp: 30 tablet, Rfl: 0 .  senna-docusate (SENOKOT-S) 8.6-50 MG tablet, Take 1-2 tablets by mouth daily as needed for mild constipation., Disp: 90 tablet, Rfl: 1 .  traZODone (DESYREL) 150 MG tablet, Take 1 tablet (150 mg total) by mouth at bedtime as needed for sleep., Disp: 30 tablet, Rfl: 11 .  valACYclovir (VALTREX) 1000 MG tablet, Take 1 tablet (1,000 mg total) by mouth 3 (three) times daily., Disp: 21 tablet, Rfl: 0 .  Cholecalciferol 1.25 MG (50000 UT) capsule, Take 1 capsule (50,000 Units total) by mouth once a week. (Patient not taking: Reported on 02/18/2019), Disp: 13 capsule, Rfl: 1  EXAM:  VITALS per patient if applicable:  GENERAL: alert, oriented, appears well and in no acute distress  PSYCH/NEURO: pleasant and cooperative, no obvious depression or anxiety, speech and thought processing grossly intact  ASSESSMENT AND PLAN:  Discussed the following  assessment and plan:  Epigastric/dyspepsia pain r/o H pylori - Plan: H Pylori, IGM, IGG, IGA AB rec try peptobismol otc and or gas x    Constipation, unspecified constipation type - Plan: senna-docusate (SENOKOT-S) 8.6-50 MG tablet  Dysuria - Plan: Urinalysis, Routine w reflex microscopic repeat culture   Chest pain, unspecified type - Plan: ECHOCARDIOGRAM COMPLETE, NM Myocar Multi W/Spect W/Wall Motion / EF She reports she cant f/u with cards currently cant afford cost of copay   Prediabetes - Plan: Hemoglobin A1c  HTN -BP variable stressed compliance with meds   HM Declines flu shot  Tdap likely  due pt unsure if had 6-7 years ago  Consider pna 23, shingrix and in future if has not had   S/p hysterectomy will ask at f/u if had abnormal pap  Referred to GI need for colonoscopy and possibly EGD with issues. H/o sigmoid resection in past for h/o diverticulitis -pt cant afford cost of copay as of 02/17/2019  referredmammogram has not scheduled yet as of 02/17/2019   rec smoking cessation smoking < or = 0.5 ppd also using THC      I discussed the assessment and treatment plan with the patient. The patient was provided an opportunity to ask questions and all were answered. The patient agreed with the plan and demonstrated an understanding of the instructions.   The patient was advised to call back or seek an in-person evaluation if the symptoms worsen or if the condition fails to improve as anticipated.  Time spent 20 minutes  Delorise Jackson, MD

## 2019-02-22 DIAGNOSIS — R6889 Other general symptoms and signs: Secondary | ICD-10-CM | POA: Diagnosis not present

## 2019-02-23 ENCOUNTER — Telehealth: Payer: Self-pay | Admitting: *Deleted

## 2019-02-23 NOTE — Telephone Encounter (Signed)
Pt returned call and the message given to her from Dr. Terese Door to schedule her for a nuclear medicine imaging. Scheduled for 03/01/2019 and to be there at 8:45 for check in. To have nothing to eat or drink 6 hours prior. It should take 3 hours. And was given a number to call to reschedule if necessary. Pt voiced understanding. See CRM # [phone]

## 2019-02-24 ENCOUNTER — Telehealth: Payer: Self-pay

## 2019-02-24 NOTE — Telephone Encounter (Signed)
Copied from [place] [id number]. Topic: Referral - Question >> Feb 24, 2019  1:15 PM Nils Flack, Marland Kitchen wrote: Reason for CRM:  Melissa from pre service called - pt is scheduled for nuclear medicine test  on 03/01/19.  This needs to be pre certed.  Please call back [phone] ext [phone] If after Thursday, please call [phone] ask for [facility]

## 2019-03-01 ENCOUNTER — Ambulatory Visit
Admission: RE | Admit: 2019-03-01 | Discharge: 2019-03-01 | Disposition: A | Discharge status: Home / Self Care | Payer: Medicare Other | Source: Ambulatory Visit | Attending: Internal Medicine | Admitting: Internal Medicine

## 2019-03-01 ENCOUNTER — Other Ambulatory Visit: Payer: Self-pay

## 2019-03-01 DIAGNOSIS — R079 Chest pain, unspecified: Secondary | ICD-10-CM | POA: Insufficient documentation

## 2019-03-01 DIAGNOSIS — R6889 Other general symptoms and signs: Secondary | ICD-10-CM | POA: Diagnosis not present

## 2019-03-01 MED ORDER — TECHNETIUM TC 99M TETROFOSMIN IV KIT
32.7100 | PACK | Freq: Once | INTRAVENOUS | Status: AC | PRN
  Administered 2019-03-01: 32.71 via INTRAVENOUS

## 2019-03-01 MED ORDER — TECHNETIUM TC 99M TETROFOSMIN IV KIT
10.4100 | PACK | Freq: Once | INTRAVENOUS | Status: AC | PRN
  Administered 2019-03-01: 10.41 via INTRAVENOUS

## 2019-03-01 MED ORDER — REGADENOSON 0.4 MG/5ML IV SOLN
0.4000 mg | Freq: Once | INTRAVENOUS | Status: AC
  Administered 2019-03-01: 0.4 mg via INTRAVENOUS

## 2019-03-02 ENCOUNTER — Telehealth: Payer: Self-pay | Admitting: Internal Medicine

## 2019-03-02 ENCOUNTER — Other Ambulatory Visit: Payer: Self-pay | Admitting: Internal Medicine

## 2019-03-02 DIAGNOSIS — I25118 Atherosclerotic heart disease of native coronary artery with other forms of angina pectoris: Secondary | ICD-10-CM

## 2019-03-02 DIAGNOSIS — R079 Chest pain, unspecified: Secondary | ICD-10-CM

## 2019-03-02 DIAGNOSIS — I10 Essential (primary) hypertension: Secondary | ICD-10-CM

## 2019-03-02 LAB — NM MYOCAR MULTI W/SPECT W/WALL MOTION / EF
LV dias vol: 71 mL (ref 46–106)
LV sys vol: 30 mL
MPHR: 169 {beats}/min
Peak HR: 90 {beats}/min
Percent HR: 53 %
Rest HR: 53 {beats}/min
TID: 0.84

## 2019-03-02 NOTE — Telephone Encounter (Signed)
Referral placed Dr. Fletcher Anon or [facility]   [place]

## 2019-03-02 NOTE — Telephone Encounter (Signed)
After reading her my chart results patient would like to followup with cardiology next  Month. Patient asking for PCP to schedule cardiologist of PCP choice.

## 2019-03-10 ENCOUNTER — Other Ambulatory Visit: Payer: Medicare Other

## 2019-03-16 ENCOUNTER — Other Ambulatory Visit: Payer: Self-pay

## 2019-03-16 ENCOUNTER — Ambulatory Visit
Admission: RE | Admit: 2019-03-16 | Discharge: 2019-03-16 | Disposition: A | Discharge status: Home / Self Care | Payer: Medicare Other | Source: Ambulatory Visit | Attending: Internal Medicine | Admitting: Internal Medicine

## 2019-03-16 DIAGNOSIS — E785 Hyperlipidemia, unspecified: Secondary | ICD-10-CM | POA: Insufficient documentation

## 2019-03-16 DIAGNOSIS — J449 Chronic obstructive pulmonary disease, unspecified: Secondary | ICD-10-CM | POA: Insufficient documentation

## 2019-03-16 DIAGNOSIS — R079 Chest pain, unspecified: Secondary | ICD-10-CM | POA: Insufficient documentation

## 2019-03-16 DIAGNOSIS — R6889 Other general symptoms and signs: Secondary | ICD-10-CM | POA: Diagnosis not present

## 2019-03-16 DIAGNOSIS — R252 Cramp and spasm: Secondary | ICD-10-CM | POA: Diagnosis not present

## 2019-03-16 DIAGNOSIS — E119 Type 2 diabetes mellitus without complications: Secondary | ICD-10-CM | POA: Insufficient documentation

## 2019-03-16 DIAGNOSIS — Z8249 Family history of ischemic heart disease and other diseases of the circulatory system: Secondary | ICD-10-CM | POA: Diagnosis not present

## 2019-03-16 DIAGNOSIS — I10 Essential (primary) hypertension: Secondary | ICD-10-CM | POA: Diagnosis not present

## 2019-03-16 DIAGNOSIS — I2510 Atherosclerotic heart disease of native coronary artery without angina pectoris: Secondary | ICD-10-CM | POA: Insufficient documentation

## 2019-03-16 NOTE — Progress Notes (Signed)
*  PRELIMINARY RESULTS* Echocardiogram 2D Echocardiogram has been performed.  Shawna Anderson 03/16/2019, 11:44 AM

## 2019-03-24 ENCOUNTER — Other Ambulatory Visit: Payer: Medicare Other

## 2019-03-28 ENCOUNTER — Other Ambulatory Visit: Payer: Self-pay

## 2019-03-28 ENCOUNTER — Other Ambulatory Visit (INDEPENDENT_AMBULATORY_CARE_PROVIDER_SITE_OTHER): Payer: Medicare Other

## 2019-03-28 DIAGNOSIS — R300 Dysuria: Secondary | ICD-10-CM | POA: Diagnosis not present

## 2019-03-28 DIAGNOSIS — R6889 Other general symptoms and signs: Secondary | ICD-10-CM | POA: Diagnosis not present

## 2019-03-28 DIAGNOSIS — R1013 Epigastric pain: Secondary | ICD-10-CM

## 2019-03-28 DIAGNOSIS — R7303 Prediabetes: Secondary | ICD-10-CM | POA: Diagnosis not present

## 2019-03-28 LAB — HEMOGLOBIN A1C: Hgb A1c MFr Bld: 6.1 % (ref 4.6–6.5)

## 2019-03-30 LAB — URINALYSIS, ROUTINE W REFLEX MICROSCOPIC
Bilirubin Urine: NEGATIVE
Glucose, UA: NEGATIVE
Hgb urine dipstick: NEGATIVE
Ketones, ur: NEGATIVE
Leukocytes,Ua: NEGATIVE
Nitrite: NEGATIVE
Protein, ur: NEGATIVE
Specific Gravity, Urine: 1.028 (ref 1.001–1.03)
pH: 6.5 (ref 5.0–8.0)

## 2019-03-30 LAB — H PYLORI, IGM, IGG, IGA AB
H pylori, IgM Abs: <9 units (ref 0.0–8.9)
H. pylori, IgA Abs: <9 units (ref 0.0–8.9)
H. pylori, IgG AbS: 0.4 Index Value (ref 0.00–0.79)

## 2019-03-30 LAB — URINE CULTURE
MICRO NUMBER:: 877369
SPECIMEN QUALITY:: ADEQUATE

## 2019-04-04 ENCOUNTER — Encounter: Payer: Self-pay | Admitting: Internal Medicine

## 2019-04-04 ENCOUNTER — Ambulatory Visit (INDEPENDENT_AMBULATORY_CARE_PROVIDER_SITE_OTHER): Payer: Medicare Other | Admitting: Internal Medicine

## 2019-04-04 ENCOUNTER — Other Ambulatory Visit: Payer: Self-pay

## 2019-04-04 VITALS — BP 187/104 | HR 68 | Ht 63.0 in | Wt 190.5 lb

## 2019-04-04 DIAGNOSIS — I25118 Atherosclerotic heart disease of native coronary artery with other forms of angina pectoris: Secondary | ICD-10-CM | POA: Diagnosis not present

## 2019-04-04 DIAGNOSIS — I10 Essential (primary) hypertension: Secondary | ICD-10-CM | POA: Diagnosis not present

## 2019-04-04 DIAGNOSIS — E785 Hyperlipidemia, unspecified: Secondary | ICD-10-CM | POA: Diagnosis not present

## 2019-04-04 MED ORDER — ATORVASTATIN CALCIUM 80 MG PO TABS: 80.0000 mg | ORAL_TABLET | Freq: Every day | ORAL | 3 refills | Status: DC

## 2019-04-04 NOTE — Patient Instructions (Signed)
Medication Instructions:  Your physician recommends that you continue on your current medications as directed. Please refer to the Current Medication list given to you today.  START Olmesartan/HCTZ as prescribed. OK to continue atorvastatin 80 mg by mouth once a day.  If you need a refill on your cardiac medications before your next appointment, please call your pharmacy.   Lab work: NONE  Check with your Primary Care Provider about getting fasting lipid (cholesterol) profile.   If you have labs (blood work) drawn today and your tests are completely normal, you will receive your results only by: Marland Kitchen MyChart Message (if you have MyChart) OR . A paper copy in the mail If you have any lab test that is abnormal or we need to change your treatment, we will call you to review the results.  Testing/Procedures: NONE  Follow-Up: At [facility], you and your health needs are our priority.  As part of our continuing mission to provide you with exceptional heart care, we have created designated Provider Care Teams.  These Care Teams include your primary Cardiologist (physician) and Advanced Practice Providers (APPs -  Physician Assistants and Nurse Practitioners) who all work together to provide you with the care you need, when you need it. You will need a follow up appointment in 1 months with Dr End or APP.   You may see DR Harrell Gave END or one of the following Advanced Practice Providers on your designated Care Team:   Murray Hodgkins, NP Christell Faith, PA-C . Marrianne Mood, PA-C

## 2019-04-04 NOTE — Progress Notes (Signed)
New Outpatient Visit Date: 04/04/2019  Referring Provider: McLean-Scocuzza, Nino Glow, MD [place],  [place] [address]  Chief Complaint: Chest pain  HPI:  Shawna Anderson is a 53 y.o. female who is being seen today for the evaluation of chest pain with history of prior PCI at the request of Anderson, Olivia Mackie *. She has a history of coronary artery disease status post PCI to the LCx in 2010, hypertension, hyperlipidemia, GERD, sleep apnea, and manic depression.  She was admitted at [facility] in 03/2016 for chest pain in the setting of having been off multiple medications for several months due to financial constraints.  Catheterization at that time revealed moderate in-stent restenosis in the ostial/proximal LCx stent.  Medical management was recommended.  She was seen by Dr. Terese Door in early August, at which time she endorsed intermittent chest pain radiating to both arms with walking.  Subsequent stress test showed a small, subtle, partially reversible basal inferolateral defect felt most consistent with artifact but cannot rule out subtle scar with peri-infarct ischemia.  LVEF was hyperdynamic.  Subsequent echo was notable for normal LVEF with grade 1 diastolic dysfunction.  Today, Shawna Anderson reports that she has continued to have frequent chest pain since around 2012, most often at rest.  Sometimes, it will wake her up at night.  She also feels it with light activities such as cleaning around her house.  She has needed to use sublingual nitroglycerin 3-6 times per month for the last few months.  The discomfort is stabbing in quality with a maximal intensity of 10/10.  It sometimes radiates to the neck and usually resolves over the course of 15 to 20 minutes.  She notes associated "gagging" but does not vomit or feel nauseated.  She also denies palpitations, lightheadedness, shortness of breath, and edema.  Coronary artery disease was first discovered during preoperative  work-up prior to ovarian cyst removal in 2010.  Bare-metal stent (3.0 x 9 mm) was placed to the ostial/proximal LCx.  Due to elevated blood pressures, olmesartan-HCTZ was recently added by Dr. Aundra Dubin, though Shawna Anderson has yet to start taking this medication.  --------------------------------------------------------------------------------------------------  Cardiovascular History & Procedures: Cardiovascular Problems:  Coronary artery disease status post PCI to LCx (3.0 x 9 mm BMS, 2010)  Risk Factors:  Known CAD, hypertension, hyperlipidemia, obesity, and tobacco use  Cath/PCI:  LHC (04/14/2016): LMCA normal.  LAD with 20% proximal stenosis.  LCx with ostial/proximal stent with 50% in-stent restenosis.  RCA with 30% mid vessel disease and 20% proximal RPDA stenosis.  LVEF greater than 65%.  CV Surgery:  None  EP Procedures and Devices:  None  Non-Invasive Evaluation(s):  TTE (03/16/2019): Normal LV size and wall thickness.  LVEF 60 to 65% with grade 1 diastolic dysfunction.  Normal RV size and function.  No significant valvular abnormality.  Pharmacologic MPI (03/01/2019): Low risk, probably normal study.  Small in size, subtle, partially reversible defect involving the basal inferolateral segment most consistent with artifact but cannot rule out subtle scar with peri-infarct ischemia.  LVEF greater than 65%.  Recent CV Pertinent Labs: Lab Results  Component Value Date   CHOL 274 (H) 12/24/2018   HDL 41.10 12/24/2018   LDLCALC 193 (H) 12/31/2017   LDLDIRECT 159.0 12/24/2018   TRIG 213.0 (H) 12/24/2018   CHOLHDL 7 12/24/2018   INR 0.98 04/14/2016   INR 0.9 10/26/2014   K 4.5 12/24/2018   K 3.7 10/26/2014   BUN 14 12/24/2018   BUN 17 10/26/2014  CREATININE 0.72 12/24/2018   CREATININE 0.74 12/31/2017    --------------------------------------------------------------------------------------------------  Past Medical History:  Diagnosis Date  . Anxiety   . Asthma    . Bipolar 1 disorder ([place])   . Bipolar disorder ([place])   . CAD (coronary artery disease)    s/p stent BMS OM Cx  . Cervical herniated disc 04/12/2016  . COPD (chronic obstructive pulmonary disease) ([place])   . Depression   . Diabetes mellitus without complication ([place])   . Diverticulitis   . GERD (gastroesophageal reflux disease)   . Glaucoma   . History of blood transfusion   . Hyperlipidemia   . Hypertension   . OSA (obstructive sleep apnea)    not using cpap   . Plantar fasciitis    b/l feet s/p steroid shots w/o help and left surgery w/o help   . Sleep apnea   . UTI (urinary tract infection)     Past Surgical History:  Procedure Laterality Date  . ABDOMINAL HYSTERECTOMY    . ABDOMINAL SURGERY  1995   Bowel resection.  Marland Kitchen CARDIAC CATHETERIZATION N/A 04/14/2016   Procedure: Left Heart Cath and Coronary Angiography;  Surgeon: Burnell Blanks, MD;  Location: [facility];  Service: Cardiovascular;  Laterality: N/A;  . CARDIAC SURGERY    . CORONARY ANGIOPLASTY WITH STENT PLACEMENT  2010   Drug eluting stent  . OVARIAN CYST REMOVAL      Current Meds  Medication Sig  . albuterol (VENTOLIN HFA) 108 (90 Base) MCG/ACT inhaler Inhale 1-2 puffs into the lungs every 6 (six) hours as needed for wheezing or shortness of breath.  . ALPRAZolam (XANAX) 0.25 MG tablet Take 1 tablet (0.25 mg total) by mouth at bedtime as needed for anxiety or sleep.  Marland Kitchen aspirin EC 81 MG tablet Take 1 tablet (81 mg total) by mouth daily.  Marland Kitchen atorvastatin (LIPITOR) 40 MG tablet Take 1 tablet (40 mg total) by mouth daily at 6 PM.  . Cholecalciferol 1.25 MG (50000 UT) capsule Take 1 capsule (50,000 Units total) by mouth once a week.  . clopidogrel (PLAVIX) 75 MG tablet Take 1 tablet (75 mg total) by mouth daily.  . cyclobenzaprine (FLEXERIL) 5 MG tablet Take 1 tablet (5 mg total) by mouth at bedtime as needed for muscle spasms.  . divalproex (DEPAKOTE) 500 MG DR tablet Take 1 tablet (500 mg total) by  mouth 2 (two) times daily.  Marland Kitchen ipratropium-albuterol (DUONEB) 0.5-2.5 (3) MG/3ML SOLN Take 3 mLs by nebulization every 6 (six) hours as needed.  . lidocaine (LIDODERM) 5 % Place 1 patch onto the skin daily. Remove & Discard patch within 12 hours or as directed by MD  . metoprolol succinate (TOPROL-XL) 50 MG 24 hr tablet Take 1 tablet (50 mg total) by mouth daily. Take with or immediately following a meal.  . nitroGLYCERIN (NITROSTAT) 0.4 MG SL tablet Place 1 tablet (0.4 mg total) under the tongue every 5 (five) minutes as needed for chest pain. X 2 call 911 after 3rd dose  . olmesartan-hydrochlorothiazide (BENICAR HCT) 40-25 MG tablet Take 1 tablet by mouth daily. In am  . ondansetron (ZOFRAN) 4 MG tablet Take 1 tablet (4 mg total) by mouth every 8 (eight) hours as needed for nausea or vomiting.  . pantoprazole (PROTONIX) 40 MG tablet Take 1 tablet (40 mg total) by mouth daily. In am before food  . promethazine (PHENERGAN) 12.5 MG tablet Take 1 tablet (12.5 mg total) by mouth every 6 (six) hours as needed  for nausea or vomiting.  . promethazine (PHENERGAN) 25 MG tablet Take 0.5-1 tablets (12.5-25 mg total) by mouth 2 (two) times daily as needed for nausea or vomiting.  . senna-docusate (SENOKOT-S) 8.6-50 MG tablet Take 1-2 tablets by mouth daily as needed for mild constipation.  . traZODone (DESYREL) 150 MG tablet Take 1 tablet (150 mg total) by mouth at bedtime as needed for sleep.  . valACYclovir (VALTREX) 1000 MG tablet Take 1 tablet (1,000 mg total) by mouth 3 (three) times daily.    Allergies: Ativan [lorazepam], Latex, Tape, and Drixoral allergy sinus [dexbromphen-pse-apap er]  Social History   Tobacco Use  . Smoking status: Current Every Day Smoker    Packs/day: 0.50    Years: 30.00    Pack years: 15.00    Types: Cigarettes  . Smokeless tobacco: Never Used  . Tobacco comment: refused  Substance Use Topics  . Alcohol use: Yes    Comment: once a year  . Drug use: Yes    Frequency:  7.0 times per week    Types: Marijuana    Family History  Problem Relation Age of Onset  . CAD Mother   . Depression Mother   . Heart disease Mother   . Hyperlipidemia Mother   . Hypertension Mother   . CAD Brother   . Depression Brother   . Diabetes Brother   . Heart disease Brother   . Hyperlipidemia Brother   . Heart disease Father   . Alcohol abuse Father     Review of Systems: Patient reports intermittent "spasms" in her thighs and back.  Otherwise, a 12-system review of systems was performed and was negative except as noted in the HPI.  --------------------------------------------------------------------------------------------------  Physical Exam: BP (!) 187/104 (BP Location: Right Arm, Patient Position: Sitting, Cuff Size: Normal)   Pulse 68   Ht 5\' 3"  (1.6 m)   Wt 190 lb 8 oz (86.4 kg)   SpO2 98%   BMI 33.75 kg/m   General: NAD. HEENT: No conjunctival pallor or scleral icterus.  Facemask in place. Neck: Supple without lymphadenopathy, thyromegaly, JVD, or HJR. No carotid bruit. Lungs: Normal work of breathing. Clear to auscultation bilaterally without wheezes or crackles. Heart: Regular rate and rhythm without murmurs, rubs, or gallops. Non-displaced PMI. Abd: Bowel sounds present. Soft, NT/ND without hepatosplenomegaly Ext: No lower extremity edema. Radial, PT, and DP pulses are 2+ bilaterally Skin: Warm and dry without rash. Neuro: CNIII-XII intact. Strength and fine-touch sensation intact in upper and lower extremities bilaterally. Psych: Labile affect.  EKG:  NSR with left axis deviation and non-specific T wave abnormality.  No significant change since 11/28/2018.  Lab Results  Component Value Date   WBC 9.4 12/24/2018   HGB 13.4 12/24/2018   HCT 40.3 12/24/2018   MCV 87.9 12/24/2018   PLT 386.0 12/24/2018    Lab Results  Component Value Date   NA 139 12/24/2018   K 4.5 12/24/2018   CL 105 12/24/2018   CO2 25 12/24/2018   BUN 14 12/24/2018    CREATININE 0.72 12/24/2018   GLUCOSE 90 12/24/2018   ALT 27 12/24/2018    Lab Results  Component Value Date   CHOL 274 (H) 12/24/2018   HDL 41.10 12/24/2018   LDLCALC 193 (H) 12/31/2017   LDLDIRECT 159.0 12/24/2018   TRIG 213.0 (H) 12/24/2018   CHOLHDL 7 12/24/2018     --------------------------------------------------------------------------------------------------  ASSESSMENT AND PLAN: Coronary artery disease with stable angina: The patient reports a long history of chest pain,  that began about 2 years after stent implantation.  Overall, it does not seem to be worsening significantly, though Shawna Anderson is frustrated by being in pain "all the time."  She does not recall having a catheterization in 2017 at [facility] for similar pain, though I personally reviewed the images today.  Moderate in-stent restenosis was observed in the LCx.  Recent myocardial perfusion stress test showed a small, predominantly fixed basal inferolateral defect most consistent with artifact but subtle scar and peri-infarct ischemia could not be excluded.  I think it would be reasonable to pursue medical therapy.  We discussed adding a long-acting nitrate but would like to focus on blood pressure control first.  I have advised Shawna Anderson to begin taking the recently prescribed olmesartan-HCTZ.  Will continue the remainder of her medications, including dual antiplatelet therapy pending further treatment of her chest pain.  If symptoms persist despite optimal medical therapy, we will need to consider repeating a catheterization.  Smoking cessation was encouraged.  Hypertension: Blood pressure poorly controlled today, albeit in the setting of not having started recently prescribed olmesartan-HCTZ.  I have recommended that Shawna Anderson begin taking this.  She will also call us or send a message through [place] with any additional medications that she is taking at home (she believes there is one additional cardiac  medication not listed on her medication list in Epic).  Hyperlipidemia: Lipids not well controlled when checked in June.  This prompted the patient to begin taking atorvastatin 80 mg daily (up from 40 mg daily.  I advised her to speak with her PCP about repeating a lipid panel at her convenience to ensure adequate response (goal LDL less than 70).  Follow-up: Return to clinic in 1 month.  Nelva Bush, MD 04/04/2019 2:04 PM

## 2019-04-06 ENCOUNTER — Ambulatory Visit (INDEPENDENT_AMBULATORY_CARE_PROVIDER_SITE_OTHER): Payer: Medicare Other | Admitting: Internal Medicine

## 2019-04-06 ENCOUNTER — Other Ambulatory Visit: Payer: Self-pay

## 2019-04-06 VITALS — BP 128/88 | HR 91

## 2019-04-06 DIAGNOSIS — F319 Bipolar disorder, unspecified: Secondary | ICD-10-CM | POA: Diagnosis not present

## 2019-04-06 DIAGNOSIS — Z1211 Encounter for screening for malignant neoplasm of colon: Secondary | ICD-10-CM

## 2019-04-06 DIAGNOSIS — J4520 Mild intermittent asthma, uncomplicated: Secondary | ICD-10-CM | POA: Diagnosis not present

## 2019-04-06 DIAGNOSIS — K219 Gastro-esophageal reflux disease without esophagitis: Secondary | ICD-10-CM

## 2019-04-06 DIAGNOSIS — G4700 Insomnia, unspecified: Secondary | ICD-10-CM | POA: Diagnosis not present

## 2019-04-06 DIAGNOSIS — I25118 Atherosclerotic heart disease of native coronary artery with other forms of angina pectoris: Secondary | ICD-10-CM

## 2019-04-06 DIAGNOSIS — I10 Essential (primary) hypertension: Secondary | ICD-10-CM

## 2019-04-06 DIAGNOSIS — R110 Nausea: Secondary | ICD-10-CM

## 2019-04-06 MED ORDER — ATORVASTATIN CALCIUM 80 MG PO TABS: 80.0000 mg | ORAL_TABLET | Freq: Every day | ORAL | 3 refills | Status: DC

## 2019-04-06 MED ORDER — ALBUTEROL SULFATE HFA 108 (90 BASE) MCG/ACT IN AERS: 1.0000 | INHALATION_SPRAY | Freq: Four times a day (QID) | RESPIRATORY_TRACT | 3 refills | Status: DC | PRN

## 2019-04-06 MED ORDER — METOPROLOL SUCCINATE ER 50 MG PO TB24: 50.0000 mg | ORAL_TABLET | Freq: Every day | ORAL | 3 refills | Status: DC

## 2019-04-06 MED ORDER — AMLODIPINE BESYLATE 5 MG PO TABS: 5.0000 mg | ORAL_TABLET | Freq: Every day | ORAL | 3 refills | Status: DC

## 2019-04-06 MED ORDER — TRAZODONE HCL 150 MG PO TABS: 150.0000 mg | ORAL_TABLET | Freq: Every evening | ORAL | 3 refills | Status: DC | PRN

## 2019-04-06 MED ORDER — PANTOPRAZOLE SODIUM 40 MG PO TBEC: 40.0000 mg | DELAYED_RELEASE_TABLET | Freq: Every day | ORAL | 3 refills | Status: DC

## 2019-04-06 MED ORDER — ONDANSETRON 4 MG PO TBDP: 4.0000 mg | ORAL_TABLET | Freq: Three times a day (TID) | ORAL | 0 refills | Status: DC | PRN

## 2019-04-06 MED ORDER — CLOPIDOGREL BISULFATE 75 MG PO TABS: 75.0000 mg | ORAL_TABLET | Freq: Every day | ORAL | 3 refills | Status: DC

## 2019-04-06 MED ORDER — OLMESARTAN MEDOXOMIL-HCTZ 40-25 MG PO TABS: 1.0000 | ORAL_TABLET | Freq: Every day | ORAL | 3 refills | Status: DC

## 2019-04-06 MED ORDER — ALBUTEROL SULFATE (2.5 MG/3ML) 0.083% IN NEBU: 2.5000 mg | INHALATION_SOLUTION | Freq: Four times a day (QID) | RESPIRATORY_TRACT | 12 refills | Status: DC | PRN

## 2019-04-06 MED ORDER — DIVALPROEX SODIUM 500 MG PO DR TAB: 500.0000 mg | DELAYED_RELEASE_TABLET | Freq: Two times a day (BID) | ORAL | 1 refills | Status: DC

## 2019-04-06 NOTE — Progress Notes (Signed)
Virtual Visit via Video Note  I connected with [facility]  on 04/07/19 at  4:00 PM EDT by a video enabled telemedicine application and verified that I am speaking with the correct person using two identifiers.  Location patient: home Location provider:work or home office Persons participating in the virtual visit: patient, provider, pts son  I discussed the limitations of evaluation and management by telemedicine and the availability of in person appointments. The patient expressed understanding and agreed to proceed.   HPI: 1. HTN uncontrolled on toprol xl 50 mg qd benicar 40-25 just increased dose recently BP was >180s at cards appt BP today 128/88 HR 91   2. GERD uncontrolled on protonix and qd nausea in the am  -will defer to GI   3. Vitamin D def could not afford 50K weekly so pt bought 5000 IU D3 otc rec she take 1 pill daily and 2 pills on MWF for now x 6 months   4. CAD/HLD on lipitor 80 mg qhs. If pt keeps having CP with exertion and pain down her arms cards will do cath but for now on hold    ROS: See pertinent positives and negatives per HPI.  Past Medical History:  Diagnosis Date  . Anxiety   . Asthma   . Bipolar 1 disorder ([place])   . Bipolar disorder ([place])   . CAD (coronary artery disease)    s/p stent BMS OM Cx  . Cervical herniated disc 04/12/2016  . COPD (chronic obstructive pulmonary disease) ([place])   . Depression   . Diabetes mellitus without complication ([place])   . Diverticulitis   . GERD (gastroesophageal reflux disease)   . Glaucoma   . History of blood transfusion   . Hyperlipidemia   . Hypertension   . OSA (obstructive sleep apnea)    not using cpap   . Plantar fasciitis    b/l feet s/p steroid shots w/o help and left surgery w/o help   . UTI (urinary tract infection)     Past Surgical History:  Procedure Laterality Date  . ABDOMINAL HYSTERECTOMY    . ABDOMINAL SURGERY  1995   Bowel resection.  Marland Kitchen CARDIAC CATHETERIZATION N/A 04/14/2016   Procedure: Left Heart Cath and Coronary Angiography;  Surgeon: Burnell Blanks, MD;  Location: [facility];  Service: Cardiovascular;  Laterality: N/A;  . CARDIAC SURGERY    . CORONARY ANGIOPLASTY WITH STENT PLACEMENT  2010   Drug eluting stent  . OVARIAN CYST REMOVAL      Family History  Problem Relation Age of Onset  . CAD Mother   . Depression Mother   . Heart disease Mother   . Hyperlipidemia Mother   . Hypertension Mother   . CAD Brother   . Depression Brother   . Diabetes Brother   . Heart disease Brother   . Hyperlipidemia Brother   . Heart disease Father   . Alcohol abuse Father     SOCIAL HX: lives at home   Current Outpatient Medications:  .  albuterol (PROVENTIL) (2.5 MG/3ML) 0.083% nebulizer solution, Take 3 mLs (2.5 mg total) by nebulization every 6 (six) hours as needed for wheezing or shortness of breath., Disp: 360 mL, Rfl: 12 .  albuterol (VENTOLIN HFA) 108 (90 Base) MCG/ACT inhaler, Inhale 1-2 puffs into the lungs every 6 (six) hours as needed for wheezing or shortness of breath., Disp: 54 g, Rfl: 3 .  ALPRAZolam (XANAX) 0.25 MG tablet, Take 1 tablet (0.25 mg total) by  mouth at bedtime as needed for anxiety or sleep., Disp: 30 tablet, Rfl: 2 .  aspirin EC 81 MG tablet, Take 1 tablet (81 mg total) by mouth daily., Disp: 90 tablet, Rfl: 3 .  atorvastatin (LIPITOR) 80 MG tablet, Take 1 tablet (80 mg total) by mouth daily at 6 PM., Disp: 90 tablet, Rfl: 3 .  Cholecalciferol (VITAMIN D-3) 125 MCG (5000 UT) TABS, Take by mouth. 1 pill daily and MWF 2 pills x 6 months then back to 1 pill daily starting month 7, Disp: , Rfl:  .  clopidogrel (PLAVIX) 75 MG tablet, Take 1 tablet (75 mg total) by mouth daily., Disp: 90 tablet, Rfl: 3 .  cyclobenzaprine (FLEXERIL) 5 MG tablet, Take 1 tablet (5 mg total) by mouth at bedtime as needed for muscle spasms., Disp: 30 tablet, Rfl: 0 .  divalproex (DEPAKOTE) 500 MG DR tablet, Take 1 tablet (500 mg total) by mouth 2  (two) times daily., Disp: 180 tablet, Rfl: 1 .  ipratropium-albuterol (DUONEB) 0.5-2.5 (3) MG/3ML SOLN, Take 3 mLs by nebulization every 6 (six) hours as needed., Disp: 360 mL, Rfl: 11 .  lidocaine (LIDODERM) 5 %, Place 1 patch onto the skin daily. Remove & Discard patch within 12 hours or as directed by MD, Disp: 30 patch, Rfl: 0 .  metoprolol succinate (TOPROL-XL) 50 MG 24 hr tablet, Take 1 tablet (50 mg total) by mouth daily. Take with or immediately following a meal., Disp: 90 tablet, Rfl: 3 .  nitroGLYCERIN (NITROSTAT) 0.4 MG SL tablet, Place 1 tablet (0.4 mg total) under the tongue every 5 (five) minutes as needed for chest pain. X 2 call 911 after 3rd dose, Disp: 30 tablet, Rfl: 5 .  olmesartan-hydrochlorothiazide (BENICAR HCT) 40-25 MG tablet, Take 1 tablet by mouth daily. In am, Disp: 90 tablet, Rfl: 3 .  pantoprazole (PROTONIX) 40 MG tablet, Take 1 tablet (40 mg total) by mouth daily. In am before food, Disp: 90 tablet, Rfl: 3 .  senna-docusate (SENOKOT-S) 8.6-50 MG tablet, Take 1-2 tablets by mouth daily as needed for mild constipation., Disp: 90 tablet, Rfl: 1 .  traZODone (DESYREL) 150 MG tablet, Take 1 tablet (150 mg total) by mouth at bedtime as needed for sleep., Disp: 90 tablet, Rfl: 3 .  valACYclovir (VALTREX) 1000 MG tablet, Take 1 tablet (1,000 mg total) by mouth 3 (three) times daily., Disp: 21 tablet, Rfl: 0 .  amLODipine (NORVASC) 5 MG tablet, Take 1 tablet (5 mg total) by mouth at bedtime., Disp: 90 tablet, Rfl: 3 .  ondansetron (ZOFRAN-ODT) 4 MG disintegrating tablet, Take 1 tablet (4 mg total) by mouth every 8 (eight) hours as needed for nausea or vomiting., Disp: 90 tablet, Rfl: 0  EXAM:  VITALS per patient if applicable:  GENERAL: alert, oriented, appears well and in no acute distress  HEENT: atraumatic, conjunttiva clear, no obvious abnormalities on inspection of external nose and ears  NECK: normal movements of the head and neck  LUNGS: on inspection no signs of  respiratory distress, breathing rate appears normal, no obvious gross SOB, gasping or wheezing  CV: no obvious cyanosis  MS: moves all visible extremities without noticeable abnormality  PSYCH/NEURO: pleasant and cooperative, no obvious depression or anxiety, speech and thought processing grossly intact  ASSESSMENT AND PLAN:  Discussed the following assessment and plan:  Essential hypertension - Plan: olmesartan-hydrochlorothiazide (BENICAR HCT) 40-25 MG tablet, amLODipine (NORVASC) 5 MG tablet qhs, toprol 50 mg qd   Mild intermittent asthma, unspecified whether complicated - Plan:  albuterol (VENTOLIN HFA) 108 (90 Base) MCG/ACT inhaler  Bipolar affective disorder, remission status unspecified ([place]) - Plan: divalproex (DEPAKOTE) 500 MG DR tablet -advised pt she will need psych in future per pt cant afford copay for now   Coronary artery disease of native artery of native heart with stable angina pectoris ([place]) - Plan: metoprolol succinate (TOPROL-XL) 50 MG 24 hr tablet, clopidogrel (PLAVIX) 75 MG tablet, atorvastatin (LIPITOR) 80 MG tablet -if CP continues cards will do cath but stress test recently negative   Insomnia, unspecified type - Plan: traZODone (DESYREL) 150 MG tablet  Gastroesophageal reflux disease, esophagitis presence not specified - Plan: pantoprazole (PROTONIX) 40 MG tablet Nausea - Plan: ondansetron (ZOFRAN-ODT) 4 MG disintegrating tablet  -refer to GI needs EGD   HM Declines flu shot  Tdap will do in future   Consider pna 23, shingrix and in future if has not had   S/p hysterectomy will ask at f/u if had abnormal pap   Referred to GI need for colonoscopy and  EGD with issues. H/o sigmoid resection in past for h/o diverticulitis  -referredmammogramhas not scheduled yetas of 02/17/2019 encouraged to schedule   rec smoking cessation smoking < or = 0.5 ppd also using THC   -we discussed possible serious and likely etiologies, options for evaluation and  workup, limitations of telemedicine visit vs in person visit, treatment, treatment risks and precautions. Pt prefers to treat via telemedicine empirically rather then risking or undertaking an in person visit at this moment. Patient agrees to seek prompt in person care if worsening, new symptoms arise, or if is not improving with treatment.   I discussed the assessment and treatment plan with the patient. The patient was provided an opportunity to ask questions and all were answered. The patient agreed with the plan and demonstrated an understanding of the instructions.   The patient was advised to call back or seek an in-person evaluation if the symptoms worsen or if the condition fails to improve as anticipated.  Time spent 25 minutes  Delorise Jackson, MD

## 2019-04-07 NOTE — Addendum Note (Signed)
Addended by: Britt Bottom on: 04/07/2019 11:19 AM   Modules accepted: Orders

## 2019-04-08 ENCOUNTER — Other Ambulatory Visit: Payer: Self-pay | Admitting: Internal Medicine

## 2019-04-08 DIAGNOSIS — J4520 Mild intermittent asthma, uncomplicated: Secondary | ICD-10-CM

## 2019-04-08 MED ORDER — ALBUTEROL SULFATE HFA 108 (90 BASE) MCG/ACT IN AERS: 1.0000 | INHALATION_SPRAY | Freq: Four times a day (QID) | RESPIRATORY_TRACT | 3 refills | Status: DC | PRN

## 2019-04-14 ENCOUNTER — Other Ambulatory Visit: Payer: Self-pay | Admitting: Internal Medicine

## 2019-04-14 DIAGNOSIS — J4520 Mild intermittent asthma, uncomplicated: Secondary | ICD-10-CM

## 2019-04-14 MED ORDER — PROAIR RESPICLICK 108 (90 BASE) MCG/ACT IN AEPB: 1.0000 | INHALATION_SPRAY | RESPIRATORY_TRACT | 4 refills | Status: DC | PRN

## 2019-05-04 ENCOUNTER — Ambulatory Visit: Payer: Medicare Other | Admitting: Internal Medicine

## 2019-05-16 ENCOUNTER — Other Ambulatory Visit: Payer: Self-pay

## 2019-05-16 ENCOUNTER — Ambulatory Visit: Payer: Self-pay

## 2019-05-16 ENCOUNTER — Emergency Department
Admission: EM | Admit: 2019-05-16 | Discharge: 2019-05-16 | Disposition: A | Discharge status: Home / Self Care | Payer: Medicare Other | Attending: Emergency Medicine | Admitting: Emergency Medicine

## 2019-05-16 ENCOUNTER — Emergency Department: Payer: Medicare Other

## 2019-05-16 ENCOUNTER — Encounter: Payer: Self-pay | Admitting: Emergency Medicine

## 2019-05-16 DIAGNOSIS — K529 Noninfective gastroenteritis and colitis, unspecified: Secondary | ICD-10-CM | POA: Diagnosis not present

## 2019-05-16 DIAGNOSIS — F17210 Nicotine dependence, cigarettes, uncomplicated: Secondary | ICD-10-CM | POA: Diagnosis not present

## 2019-05-16 DIAGNOSIS — E119 Type 2 diabetes mellitus without complications: Secondary | ICD-10-CM | POA: Insufficient documentation

## 2019-05-16 DIAGNOSIS — R1032 Left lower quadrant pain: Secondary | ICD-10-CM | POA: Diagnosis present

## 2019-05-16 DIAGNOSIS — Z79899 Other long term (current) drug therapy: Secondary | ICD-10-CM | POA: Diagnosis not present

## 2019-05-16 DIAGNOSIS — Z7982 Long term (current) use of aspirin: Secondary | ICD-10-CM | POA: Diagnosis not present

## 2019-05-16 DIAGNOSIS — I25111 Atherosclerotic heart disease of native coronary artery with angina pectoris with documented spasm: Secondary | ICD-10-CM | POA: Insufficient documentation

## 2019-05-16 DIAGNOSIS — Z91040 Latex allergy status: Secondary | ICD-10-CM | POA: Insufficient documentation

## 2019-05-16 DIAGNOSIS — R1110 Vomiting, unspecified: Secondary | ICD-10-CM | POA: Diagnosis not present

## 2019-05-16 DIAGNOSIS — J449 Chronic obstructive pulmonary disease, unspecified: Secondary | ICD-10-CM | POA: Diagnosis not present

## 2019-05-16 DIAGNOSIS — R079 Chest pain, unspecified: Secondary | ICD-10-CM | POA: Diagnosis not present

## 2019-05-16 LAB — CBC
HCT: 38.7 % (ref 36.0–46.0)
Hemoglobin: 12.7 g/dL (ref 12.0–15.0)
MCH: 28.7 pg (ref 26.0–34.0)
MCHC: 32.8 g/dL (ref 30.0–36.0)
MCV: 87.4 fL (ref 80.0–100.0)
Platelets: 453 10*3/uL — ABNORMAL HIGH (ref 150–400)
RBC: 4.43 MIL/uL (ref 3.87–5.11)
RDW: 13.1 % (ref 11.5–15.5)
WBC: 15.7 10*3/uL — ABNORMAL HIGH (ref 4.0–10.5)
nRBC: 0 % (ref 0.0–0.2)

## 2019-05-16 LAB — COMPREHENSIVE METABOLIC PANEL
ALT: 15 U/L (ref 0–44)
AST: 19 U/L (ref 15–41)
Albumin: 4 g/dL (ref 3.5–5.0)
Alkaline Phosphatase: 79 U/L (ref 38–126)
Anion gap: 12 (ref 5–15)
BUN: 29 mg/dL — ABNORMAL HIGH (ref 6–20)
CO2: 27 mmol/L (ref 22–32)
Calcium: 8.8 mg/dL — ABNORMAL LOW (ref 8.9–10.3)
Chloride: 97 mmol/L — ABNORMAL LOW (ref 98–111)
Creatinine, Ser: 1.15 mg/dL — ABNORMAL HIGH (ref 0.44–1.00)
GFR calc Af Amer: >60 mL/min (ref >60)
GFR calc non Af Amer: 55 mL/min — ABNORMAL LOW (ref >60)
Glucose, Bld: 114 mg/dL — ABNORMAL HIGH (ref 70–99)
Potassium: 3.5 mmol/L (ref 3.5–5.1)
Sodium: 136 mmol/L (ref 135–145)
Total Bilirubin: 0.6 mg/dL (ref 0.3–1.2)
Total Protein: 7.4 g/dL (ref 6.5–8.1)

## 2019-05-16 LAB — URINALYSIS, COMPLETE (UACMP) WITH MICROSCOPIC
Bacteria, UA: NONE SEEN
Bilirubin Urine: NEGATIVE
Glucose, UA: NEGATIVE mg/dL
Hgb urine dipstick: NEGATIVE
Ketones, ur: NEGATIVE mg/dL
Nitrite: NEGATIVE
Protein, ur: NEGATIVE mg/dL
Specific Gravity, Urine: 1.019 (ref 1.005–1.030)
pH: 6 (ref 5.0–8.0)

## 2019-05-16 LAB — LIPASE, BLOOD: Lipase: 21 U/L (ref 11–51)

## 2019-05-16 MED ORDER — OXYCODONE-ACETAMINOPHEN 5-325 MG PO TABS
1.0000 | ORAL_TABLET | Freq: Once | ORAL | Status: AC
  Administered 2019-05-16: 1 via ORAL
  Filled 2019-05-16: qty 1

## 2019-05-16 MED ORDER — MORPHINE SULFATE (PF) 4 MG/ML IV SOLN
4.0000 mg | Freq: Once | INTRAVENOUS | Status: AC
  Administered 2019-05-16: 16:00:00 4 mg via INTRAVENOUS
  Filled 2019-05-16: qty 1

## 2019-05-16 MED ORDER — METRONIDAZOLE 500 MG PO TABS: 500.0000 mg | ORAL_TABLET | Freq: Two times a day (BID) | ORAL | 0 refills | Status: DC

## 2019-05-16 MED ORDER — NITROGLYCERIN 0.4 MG SL SUBL
SUBLINGUAL_TABLET | SUBLINGUAL | Status: AC
  Administered 2019-05-16: 17:00:00 0.4 mg via SUBLINGUAL
  Filled 2019-05-16: qty 1

## 2019-05-16 MED ORDER — IOHEXOL 300 MG/ML  SOLN
100.0000 mL | Freq: Once | INTRAMUSCULAR | Status: AC | PRN
  Administered 2019-05-16: 16:00:00 100 mL via INTRAVENOUS
  Filled 2019-05-16: qty 100

## 2019-05-16 MED ORDER — CIPROFLOXACIN HCL 500 MG PO TABS
500.0000 mg | ORAL_TABLET | Freq: Once | ORAL | Status: AC
  Administered 2019-05-16: 500 mg via ORAL
  Filled 2019-05-16: qty 1

## 2019-05-16 MED ORDER — NITROGLYCERIN 0.4 MG SL SUBL
0.4000 mg | SUBLINGUAL_TABLET | Freq: Once | SUBLINGUAL | Status: AC
  Administered 2019-05-16: 17:00:00 0.4 mg via SUBLINGUAL

## 2019-05-16 MED ORDER — SODIUM CHLORIDE 0.9% FLUSH
3.0000 mL | Freq: Once | INTRAVENOUS | Status: AC
  Administered 2019-05-16: 16:00:00 3 mL via INTRAVENOUS

## 2019-05-16 MED ORDER — HYDROMORPHONE HCL 1 MG/ML IJ SOLN
1.0000 mg | Freq: Once | INTRAMUSCULAR | Status: AC
  Administered 2019-05-16: 17:00:00 1 mg via INTRAVENOUS

## 2019-05-16 MED ORDER — METRONIDAZOLE IN NACL 5-0.79 MG/ML-% IV SOLN
500.0000 mg | Freq: Once | INTRAVENOUS | Status: DC
  Filled 2019-05-16: qty 100

## 2019-05-16 MED ORDER — OXYCODONE-ACETAMINOPHEN 5-325 MG PO TABS: 1.0000 | ORAL_TABLET | Freq: Three times a day (TID) | ORAL | 0 refills | Status: DC | PRN

## 2019-05-16 MED ORDER — METRONIDAZOLE 500 MG PO TABS
500.0000 mg | ORAL_TABLET | Freq: Once | ORAL | Status: AC
  Administered 2019-05-16: 18:00:00 500 mg via ORAL
  Filled 2019-05-16: qty 1

## 2019-05-16 MED ORDER — HYDROMORPHONE HCL 1 MG/ML IJ SOLN
INTRAMUSCULAR | Status: AC
  Administered 2019-05-16: 1 mg via INTRAVENOUS
  Filled 2019-05-16: qty 1

## 2019-05-16 MED ORDER — SODIUM CHLORIDE 0.9 % IV SOLN
1000.0000 mL | Freq: Once | INTRAVENOUS | Status: AC
  Administered 2019-05-16: 16:00:00 1000 mL via INTRAVENOUS

## 2019-05-16 MED ORDER — CIPROFLOXACIN HCL 500 MG PO TABS: 500.0000 mg | ORAL_TABLET | Freq: Two times a day (BID) | ORAL | 0 refills | Status: AC

## 2019-05-16 MED ORDER — ONDANSETRON HCL 4 MG/2ML IJ SOLN
4.0000 mg | Freq: Once | INTRAMUSCULAR | Status: AC
  Administered 2019-05-16: 4 mg via INTRAVENOUS
  Filled 2019-05-16: qty 2

## 2019-05-16 NOTE — ED Provider Notes (Signed)
[facility] Emergency Department Provider Note   ____________________________________________    I have reviewed the triage vital signs and the nursing notes.   HISTORY  Chief Complaint Abdominal Pain     HPI Shawna Anderson is a 53 y.o. female with a history as noted below who presents with complaints of left lower quadrant abdominal pain.  She reports pain started 1 to 2 days ago and has been consistent and getting slightly worse.  She reports a history of a bowel resection in the past, does report history of diverticulitis.  Does not take anything for this pain.  Describes moderate to severe left lower quadrant pain without radiation.  No hematuria, no dysuria.  No fevers or chills or nausea or vomiting  Past Medical History:  Diagnosis Date   Anxiety    Asthma    Bipolar 1 disorder ([place])    Bipolar disorder ([place])    CAD (coronary artery disease)    s/p stent BMS OM Cx   Cervical herniated disc 04/12/2016   COPD (chronic obstructive pulmonary disease) ([facility])    Depression    Diabetes mellitus without complication ([facility])    Diverticulitis    GERD (gastroesophageal reflux disease)    Glaucoma    History of blood transfusion    Hyperlipidemia    Hypertension    OSA (obstructive sleep apnea)    not using cpap    Plantar fasciitis    b/l feet s/p steroid shots w/o help and left surgery w/o help    UTI (urinary tract infection)     Patient Active Problem List   Diagnosis Date Noted   HLD (hyperlipidemia) 12/27/2018   Vitamin D deficiency 12/24/2018   Bipolar disorder ([place]) 12/30/2017   OSA (obstructive sleep apnea) 12/30/2017   Constipation 12/30/2017   Coronary artery disease of native artery of native heart with stable angina pectoris ([place]) 12/30/2017   Gastroesophageal reflux disease 12/30/2017   Asthma 12/29/2017   COPD (chronic obstructive pulmonary disease) ([place]) 12/29/2017   Anxiety and depression  12/29/2017   Insomnia 12/29/2017   Chronic back pain 12/29/2017   Skin lesion of back 12/29/2017   CAD S/P CFX PCI 2010 04/15/2016   Essential hypertension 04/15/2016   Dyslipidemia 04/15/2016   Noncompliance with medication regimen 04/15/2016   Chest pain with moderate risk of acute coronary syndrome 04/12/2016   Abdominal pain 01/12/2015   Vomiting 01/12/2015   Nausea and vomiting 01/12/2015    Past Surgical History:  Procedure Laterality Date   ABDOMINAL HYSTERECTOMY     ABDOMINAL SURGERY  1995   Bowel resection.   CARDIAC CATHETERIZATION N/A 04/14/2016   Procedure: Left Heart Cath and Coronary Angiography;  Surgeon: Burnell Blanks, MD;  Location: [facility];  Service: Cardiovascular;  Laterality: N/A;   CARDIAC SURGERY     CORONARY ANGIOPLASTY WITH STENT PLACEMENT  2010   Drug eluting stent   OVARIAN CYST REMOVAL      Prior to Admission medications   Medication Sig Start Date End Date Taking? Authorizing Provider  albuterol (PROVENTIL) (2.5 MG/3ML) 0.083% nebulizer solution Take 3 mLs (2.5 mg total) by nebulization every 6 (six) hours as needed for wheezing or shortness of breath. 04/06/19   McLean-Scocuzza, Nino Glow, MD  Albuterol Sulfate (PROAIR RESPICLICK) [id number] (90 Base) MCG/ACT AEPB Inhale 1 Inhaler into the lungs every 4 (four) hours as needed. 04/14/19   McLean-Scocuzza, Nino Glow, MD  ALPRAZolam Duanne Moron) 0.25 MG tablet Take 1 tablet (0.25  mg total) by mouth at bedtime as needed for anxiety or sleep. 11/04/18   McLean-Scocuzza, Nino Glow, MD  amLODipine (NORVASC) 5 MG tablet Take 1 tablet (5 mg total) by mouth at bedtime. 04/06/19   McLean-Scocuzza, Nino Glow, MD  aspirin EC 81 MG tablet Take 1 tablet (81 mg total) by mouth daily. 11/04/18   McLean-Scocuzza, Nino Glow, MD  atorvastatin (LIPITOR) 80 MG tablet Take 1 tablet (80 mg total) by mouth daily at 6 PM. 04/06/19 07/05/19  McLean-Scocuzza, Nino Glow, MD  Cholecalciferol (VITAMIN D-3) 125 MCG (5000 UT)  TABS Take by mouth. 1 pill daily and MWF 2 pills x 6 months then back to 1 pill daily starting month 7    [provider]  ciprofloxacin (CIPRO) 500 MG tablet Take 1 tablet (500 mg total) by mouth 2 (two) times daily for 7 days. 05/16/19 05/23/19  Lavonia Drafts, MD  clopidogrel (PLAVIX) 75 MG tablet Take 1 tablet (75 mg total) by mouth daily. 04/06/19   McLean-Scocuzza, Nino Glow, MD  cyclobenzaprine (FLEXERIL) 5 MG tablet Take 1 tablet (5 mg total) by mouth at bedtime as needed for muscle spasms. 01/20/19   McLean-Scocuzza, Nino Glow, MD  divalproex (DEPAKOTE) 500 MG DR tablet Take 1 tablet (500 mg total) by mouth 2 (two) times daily. 04/06/19   McLean-Scocuzza, Nino Glow, MD  ipratropium-albuterol (DUONEB) 0.5-2.5 (3) MG/3ML SOLN Take 3 mLs by nebulization every 6 (six) hours as needed. 12/29/17   McLean-Scocuzza, Nino Glow, MD  lidocaine (LIDODERM) 5 % Place 1 patch onto the skin daily. Remove & Discard patch within 12 hours or as directed by MD 11/16/18   McLean-Scocuzza, Nino Glow, MD  metoprolol succinate (TOPROL-XL) 50 MG 24 hr tablet Take 1 tablet (50 mg total) by mouth daily. Take with or immediately following a meal. 04/06/19   McLean-Scocuzza, Nino Glow, MD  metroNIDAZOLE (FLAGYL) 500 MG tablet Take 1 tablet (500 mg total) by mouth 2 (two) times daily after a meal. 05/16/19   Lavonia Drafts, MD  nitroGLYCERIN (NITROSTAT) 0.4 MG SL tablet Place 1 tablet (0.4 mg total) under the tongue every 5 (five) minutes as needed for chest pain. X 2 call 911 after 3rd dose 11/04/18   McLean-Scocuzza, Nino Glow, MD  olmesartan-hydrochlorothiazide (BENICAR HCT) 40-25 MG tablet Take 1 tablet by mouth daily. In am 04/06/19   McLean-Scocuzza, Nino Glow, MD  ondansetron (ZOFRAN-ODT) 4 MG disintegrating tablet Take 1 tablet (4 mg total) by mouth every 8 (eight) hours as needed for nausea or vomiting. 04/06/19   McLean-Scocuzza, Nino Glow, MD  oxyCODONE-acetaminophen (PERCOCET) 5-325 MG tablet Take 1 tablet by mouth every 8 (eight)  hours as needed for severe pain. 05/16/19 05/15/20  Lavonia Drafts, MD  pantoprazole (PROTONIX) 40 MG tablet Take 1 tablet (40 mg total) by mouth daily. In am before food 04/06/19   McLean-Scocuzza, Nino Glow, MD  senna-docusate (SENOKOT-S) 8.6-50 MG tablet Take 1-2 tablets by mouth daily as needed for mild constipation. 02/18/19   McLean-Scocuzza, Nino Glow, MD  traZODone (DESYREL) 150 MG tablet Take 1 tablet (150 mg total) by mouth at bedtime as needed for sleep. 04/06/19   McLean-Scocuzza, Nino Glow, MD  valACYclovir (VALTREX) 1000 MG tablet Take 1 tablet (1,000 mg total) by mouth 3 (three) times daily. 11/16/18   McLean-Scocuzza, Nino Glow, MD     Allergies Ativan [lorazepam], Latex, Tape, and Drixoral allergy sinus [dexbromphen-pse-apap er]  Family History  Problem Relation Age of Onset   CAD Mother    Depression Mother  Heart disease Mother    Hyperlipidemia Mother    Hypertension Mother    CAD Brother    Depression Brother    Diabetes Brother    Heart disease Brother    Hyperlipidemia Brother    Heart disease Father    Alcohol abuse Father     Social History Social History   Tobacco Use   Smoking status: Current Every Day Smoker    Packs/day: 0.50    Years: 30.00    Pack years: 15.00    Types: Cigarettes   Smokeless tobacco: Never Used   Tobacco comment: refused  Substance Use Topics   Alcohol use: Not Currently    Comment: once a year   Drug use: Yes    Frequency: 7.0 times per week    Types: Marijuana    Review of Systems  Constitutional: No fever/chills Eyes: No visual changes.  ENT: No sore throat. Cardiovascular: Denies chest pain. Respiratory: Denies shortness of breath. Gastrointestinal: As above Genitourinary: Negative for dysuria.  No hematuria Musculoskeletal: Negative for back pain. Skin: Negative for rash. Neurological: Negative for headaches   ____________________________________________   PHYSICAL EXAM:  VITAL SIGNS: ED Triage  Vitals  Enc Vitals Group     BP 05/16/19 1505 (!) 113/59     Pulse Rate 05/16/19 1505 76     Resp 05/16/19 1505 16     Temp 05/16/19 1505 98.2 F (36.8 C)     Temp src --      SpO2 05/16/19 1505 97 %     Weight 05/16/19 1503 79.4 kg (175 lb)     Height 05/16/19 1503 1.6 m (5\' 3" )     Head Circumference --      Peak Flow --      Pain Score 05/16/19 1502 8     Pain Loc --      Pain Edu? --      Excl. in [place]? --     Constitutional: Alert and oriented.   Nose: No congestion/rhinnorhea. Mouth/Throat: Mucous membranes are moist.    Cardiovascular: Normal rate, regular rhythm. Grossly normal heart sounds.  Good peripheral circulation. Respiratory: Normal respiratory effort.  No retractions. Lungs CTAB. Gastrointestinal: Tenderness palpation left lower quadrant no distention.  No CVA tenderness.  Musculoskeletal: .  Warm and well perfused Neurologic:  Normal speech and language. No gross focal neurologic deficits are appreciated.  Skin:  Skin is warm, dry and intact. No rash noted. Psychiatric: Mood and affect are normal. Speech and behavior are normal.  ____________________________________________   LABS (all labs ordered are listed, but only abnormal results are displayed)  Labs Reviewed  COMPREHENSIVE METABOLIC PANEL - Abnormal; Notable for the following components:      Result Value   Chloride 97 (*)    Glucose, Bld 114 (*)    BUN 29 (*)    Creatinine, Ser 1.15 (*)    Calcium 8.8 (*)    GFR calc non Af Amer 55 (*)    All other components within normal limits  CBC - Abnormal; Notable for the following components:   WBC 15.7 (*)    Platelets 453 (*)    All other components within normal limits  URINALYSIS, COMPLETE (UACMP) WITH MICROSCOPIC - Abnormal; Notable for the following components:   Color, Urine YELLOW (*)    APPearance CLEAR (*)    Leukocytes,Ua TRACE (*)    All other components within normal limits  LIPASE, BLOOD  POC URINE PREG, ED    ____________________________________________  EKG  ED ECG REPORT I, Lavonia Drafts, the attending physician, personally viewed and interpreted this ECG.  Date: 05/16/2019  Rhythm: normal sinus rhythm QRS Axis: normal Intervals: normal ST/T Wave abnormalities: normal Narrative Interpretation: no evidence of acute ischemia  ____________________________________________  RADIOLOGY  CT abdomen pelvis ____________________________________________   PROCEDURES  Procedure(s) performed: No  Procedures   Critical Care performed: No ____________________________________________   INITIAL IMPRESSION / ASSESSMENT AND PLAN / ED COURSE  Pertinent labs & imaging results that were available during my care of the patient were reviewed by me and considered in my medical decision making (see chart for details).  Patient presents with left lower quadrant abdominal pain, concerning for diverticulitis.  Also differential is urinary tract infection, ureterolithiasis.  We will treat with IV morphine, IV Zofran, IV fluids obtain CT abdomen pelvis.  Lab work significant for elevated white blood cell count, mild dehydration.  Urinalysis is overall reassuring  Patient CT scan is overall reassuring, some inflammation present at the anastomosis consistent with colitis.  Discussed need for outpatient ultrasound of possible ovarian mass, patient will follow-up with GYN.  Patient is doing much better after treatment, she would prefer to go home, I think that is reasonable.  We will start p.o. antibiotics, p.o. analgesics, strict return precautions of any worsening, fever, nausea, vomiting    ____________________________________________   FINAL CLINICAL IMPRESSION(S) / ED DIAGNOSES  Final diagnoses:  Colitis        Note:  This document was prepared using Dragon voice recognition software and may include unintentional dictation errors.   Lavonia Drafts, MD 05/16/19 [phone]

## 2019-05-16 NOTE — ED Notes (Signed)
When pt returned from CT pt was yelling loudly about having chest pain and still having abd pain. MD Corky Downs was made aware and pt was medicated (see MAR) and and EKG was performed and given to Dr. Corky Downs. PT seems to have a small amount of relief but is still vocally uncomfortable.

## 2019-05-16 NOTE — Telephone Encounter (Signed)
Patient called stating that for several days she has had nausea and vomiting.  She is calling today because she has severe left abdominal pain and pressure.  No fever and no vomiting today. She will go to ER for evaluation. No triage was done because of the severe pain described by patient. Patient hung up to call her ride to hospital.  NT offered to call 911. Patient refused stating that she will call back if needed.   Reason for Disposition . [1] SEVERE pain (e.g., excruciating) AND [2] present > 1 hour  Answer Assessment - Initial Assessment Questions 1. LOCATION: "Where does it hurt?"      Left side 2. RADIATION: "Does the pain shoot anywhere else?" (e.g., chest, back)     *No Answer* 3. ONSET: "When did the pain begin?" (e.g., minutes, hours or days ago)      *No Answer* 4. SUDDEN: "Gradual or sudden onset?"     *No Answer* 5. PATTERN "Does the pain come and go, or is it constant?"    - If constant: "Is it getting better, staying the same, or worsening?"      (Note: Constant means the pain never goes away completely; most serious pain is constant and it progresses)     - If intermittent: "How long does it last?" "Do you have pain now?"     (Note: Intermittent means the pain goes away completely between bouts)     *No Answer* 6. SEVERITY: "How bad is the pain?"  (e.g., Scale 1-10; mild, moderate, or severe)   - MILD (1-3): doesn't interfere with normal activities, abdomen soft and not tender to touch    - MODERATE (4-7): interferes with normal activities or awakens from sleep, tender to touch    - SEVERE (8-10): excruciating pain, doubled over, unable to do any normal activities      *No Answer* 7. RECURRENT SYMPTOM: "Have you ever had this type of abdominal pain before?" If so, ask: "When was the last time?" and "What happened that time?"      *No Answer* 8. CAUSE: "What do you think is causing the abdominal pain?"     *No Answer* 9. RELIEVING/AGGRAVATING FACTORS: "What makes it  better or worse?" (e.g., movement, antacids, bowel movement)     *No Answer* 10. OTHER SYMPTOMS: "Has there been any vomiting, diarrhea, constipation, or urine problems?"       *No Answer* 11. PREGNANCY: "Is there any chance you are pregnant?" "When was your last menstrual period?"       *No Answer*  Protocols used: ABDOMINAL PAIN - [facility]

## 2019-05-16 NOTE — Telephone Encounter (Addendum)
Spoke with patient she rated pain in left abdomen at a 10 she is in route to ER.

## 2019-05-16 NOTE — ED Triage Notes (Signed)
Pt to ED via POV c/o LLQ abd pain. Pt states that she has been vomiting for the past 2 days not none today. Pt states that it feels like something "wants to pop". Pt denies fever or diarrhea but states that the first day she was vomiting that she had some chills. Pt states that the pain has been present the whole time. Pt has taken OTC meds without relief.

## 2019-05-16 NOTE — Telephone Encounter (Signed)
Noted  TMS 

## 2019-05-18 ENCOUNTER — Ambulatory Visit: Payer: Medicare Other | Admitting: Gastroenterology

## 2019-05-18 ENCOUNTER — Encounter: Payer: Self-pay | Admitting: Gastroenterology

## 2019-05-18 ENCOUNTER — Other Ambulatory Visit: Payer: Self-pay

## 2019-05-18 ENCOUNTER — Telehealth: Payer: Self-pay

## 2019-05-18 ENCOUNTER — Telehealth: Payer: Self-pay | Admitting: Internal Medicine

## 2019-05-18 VITALS — BP 116/63 | HR 67 | Temp 98.5°F | Ht 63.0 in | Wt 187.4 lb

## 2019-05-18 DIAGNOSIS — R1032 Left lower quadrant pain: Secondary | ICD-10-CM | POA: Diagnosis not present

## 2019-05-18 DIAGNOSIS — K219 Gastro-esophageal reflux disease without esophagitis: Secondary | ICD-10-CM

## 2019-05-18 DIAGNOSIS — R933 Abnormal findings on diagnostic imaging of other parts of digestive tract: Secondary | ICD-10-CM

## 2019-05-18 DIAGNOSIS — K5900 Constipation, unspecified: Secondary | ICD-10-CM

## 2019-05-18 MED ORDER — DICYCLOMINE HCL 10 MG PO CAPS: 10.0000 mg | ORAL_CAPSULE | Freq: Three times a day (TID) | ORAL | 2 refills | Status: DC

## 2019-05-18 MED ORDER — PANTOPRAZOLE SODIUM 40 MG PO TBEC: 40.0000 mg | DELAYED_RELEASE_TABLET | Freq: Two times a day (BID) | ORAL | 5 refills | Status: DC

## 2019-05-18 MED ORDER — NA SULFATE-K SULFATE-MG SULF 17.5-3.13-1.6 GM/177ML PO SOLN: 1.0000 | Freq: Once | ORAL | 0 refills | Status: AC

## 2019-05-18 NOTE — Progress Notes (Signed)
Jonathon Bellows MD, MRCP(U.K) [address]  [place]  [place], [place] [address]  Main: [phone]  Fax: [phone]   Gastroenterology Consultation  Referring Provider:     McLean-Scocuzza, Olivia Mackie * Primary Care Physician:  McLean-Scocuzza, Nino Glow, MD Primary Gastroenterologist:  Dr. Jonathon Bellows  Reason for Consultation:     GERD, requiring EGD and colonoscopy.        HPI:   Shawna Anderson is a 53 y.o. y/o female referred for consultation & management  by Dr. Terese Door, Nino Glow, MD.    She states that she has had acid reflux for over 10 years.  She has gained a substantial amount of weight recently.  She has tried Prilosec and other PPIs in the past which she says has not helped her.  She is presently on Protonix once a day which she said is not helping her.  Her symptoms are of heartburn and regurgitation.  She says that she has had rupture of her intestines at the age of 65 possibly due to diverticulitis.  She has not had a colonoscopy recently.  Denies any personal history or family history of colon cancer or polyps.  She is still complaining of pain in the left lower quadrant which has not changed since presenting to the emergency room on 05/16/2019.  She has only taken 1 day of antibiotics has not taken her dose this morning either.  Denies any rectal bleeding or diarrhea.  Denies any fever.  Presented to the emergency room on 05/16/2019 with left lower quadrant pain.  She underwent a CT scan of the abdomen.  It showed a distal partial colectomy and then to a distal colonic anastomosis, new wall thickening with surrounding fat stranding involving the short blind-ending limb of the distal colonic anastomosis in the left lower quadrant suggesting a nonspecific infectious or inflammatory colitis.  Hysterectomy showing an indeterminant left adnexal soft tissue focus adjacent to the perianastomotic thick-walled blind-ending distal colonic limb increase in 01/20/2019.  A left ovarian  mass cannot be entirely excluded.  05/16/2019 hemoglobin 12.7 g, creatinine 1.15, lipase normal  Past Medical History:  Diagnosis Date   Anxiety    Asthma    Bipolar 1 disorder (HCC)    Bipolar disorder ([place])    CAD (coronary artery disease)    s/p stent BMS OM Cx   Cervical herniated disc 04/12/2016   COPD (chronic obstructive pulmonary disease) (HCC)    Depression    Diabetes mellitus without complication (HCC)    Diverticulitis    GERD (gastroesophageal reflux disease)    Glaucoma    History of blood transfusion    Hyperlipidemia    Hypertension    OSA (obstructive sleep apnea)    not using cpap    Plantar fasciitis    b/l feet s/p steroid shots w/o help and left surgery w/o help    UTI (urinary tract infection)     Past Surgical History:  Procedure Laterality Date   ABDOMINAL HYSTERECTOMY     ABDOMINAL SURGERY  1995   Bowel resection.   CARDIAC CATHETERIZATION N/A 04/14/2016   Procedure: Left Heart Cath and Coronary Angiography;  Surgeon: Burnell Blanks, MD;  Location: [facility];  Service: Cardiovascular;  Laterality: N/A;   CARDIAC SURGERY     CORONARY ANGIOPLASTY WITH STENT PLACEMENT  2010   Drug eluting stent   OVARIAN CYST REMOVAL      Prior to Admission medications   Medication Sig Start Date End Date  Taking? Authorizing Provider  albuterol (PROVENTIL) (2.5 MG/3ML) 0.083% nebulizer solution Take 3 mLs (2.5 mg total) by nebulization every 6 (six) hours as needed for wheezing or shortness of breath. 04/06/19   McLean-Scocuzza, Nino Glow, MD  Albuterol Sulfate (PROAIR RESPICLICK) [id number] (90 Base) MCG/ACT AEPB Inhale 1 Inhaler into the lungs every 4 (four) hours as needed. 04/14/19   McLean-Scocuzza, Nino Glow, MD  ALPRAZolam Duanne Moron) 0.25 MG tablet Take 1 tablet (0.25 mg total) by mouth at bedtime as needed for anxiety or sleep. 11/04/18   McLean-Scocuzza, Nino Glow, MD  amLODipine (NORVASC) 5 MG tablet Take 1 tablet (5 mg total) by mouth  at bedtime. 04/06/19   McLean-Scocuzza, Nino Glow, MD  aspirin EC 81 MG tablet Take 1 tablet (81 mg total) by mouth daily. 11/04/18   McLean-Scocuzza, Nino Glow, MD  atorvastatin (LIPITOR) 80 MG tablet Take 1 tablet (80 mg total) by mouth daily at 6 PM. 04/06/19 07/05/19  McLean-Scocuzza, Nino Glow, MD  Cholecalciferol (VITAMIN D-3) 125 MCG (5000 UT) TABS Take by mouth. 1 pill daily and MWF 2 pills x 6 months then back to 1 pill daily starting month 7    [provider]  ciprofloxacin (CIPRO) 500 MG tablet Take 1 tablet (500 mg total) by mouth 2 (two) times daily for 7 days. 05/16/19 05/23/19  Lavonia Drafts, MD  clopidogrel (PLAVIX) 75 MG tablet Take 1 tablet (75 mg total) by mouth daily. 04/06/19   McLean-Scocuzza, Nino Glow, MD  cyclobenzaprine (FLEXERIL) 5 MG tablet Take 1 tablet (5 mg total) by mouth at bedtime as needed for muscle spasms. 01/20/19   McLean-Scocuzza, Nino Glow, MD  divalproex (DEPAKOTE) 500 MG DR tablet Take 1 tablet (500 mg total) by mouth 2 (two) times daily. 04/06/19   McLean-Scocuzza, Nino Glow, MD  ipratropium-albuterol (DUONEB) 0.5-2.5 (3) MG/3ML SOLN Take 3 mLs by nebulization every 6 (six) hours as needed. 12/29/17   McLean-Scocuzza, Nino Glow, MD  lidocaine (LIDODERM) 5 % Place 1 patch onto the skin daily. Remove & Discard patch within 12 hours or as directed by MD 11/16/18   McLean-Scocuzza, Nino Glow, MD  metoprolol succinate (TOPROL-XL) 50 MG 24 hr tablet Take 1 tablet (50 mg total) by mouth daily. Take with or immediately following a meal. 04/06/19   McLean-Scocuzza, Nino Glow, MD  metroNIDAZOLE (FLAGYL) 500 MG tablet Take 1 tablet (500 mg total) by mouth 2 (two) times daily after a meal. 05/16/19   Lavonia Drafts, MD  nitroGLYCERIN (NITROSTAT) 0.4 MG SL tablet Place 1 tablet (0.4 mg total) under the tongue every 5 (five) minutes as needed for chest pain. X 2 call 911 after 3rd dose 11/04/18   McLean-Scocuzza, Nino Glow, MD  olmesartan-hydrochlorothiazide (BENICAR HCT) 40-25 MG tablet Take 1  tablet by mouth daily. In am 04/06/19   McLean-Scocuzza, Nino Glow, MD  ondansetron (ZOFRAN-ODT) 4 MG disintegrating tablet Take 1 tablet (4 mg total) by mouth every 8 (eight) hours as needed for nausea or vomiting. 04/06/19   McLean-Scocuzza, Nino Glow, MD  oxyCODONE-acetaminophen (PERCOCET) 5-325 MG tablet Take 1 tablet by mouth every 8 (eight) hours as needed for severe pain. 05/16/19 05/15/20  Lavonia Drafts, MD  pantoprazole (PROTONIX) 40 MG tablet Take 1 tablet (40 mg total) by mouth daily. In am before food 04/06/19   McLean-Scocuzza, Nino Glow, MD  senna-docusate (SENOKOT-S) 8.6-50 MG tablet Take 1-2 tablets by mouth daily as needed for mild constipation. 02/18/19   McLean-Scocuzza, Nino Glow, MD  traZODone (DESYREL) 150 MG tablet Take 1  tablet (150 mg total) by mouth at bedtime as needed for sleep. 04/06/19   McLean-Scocuzza, Nino Glow, MD  valACYclovir (VALTREX) 1000 MG tablet Take 1 tablet (1,000 mg total) by mouth 3 (three) times daily. 11/16/18   McLean-Scocuzza, Nino Glow, MD    Family History  Problem Relation Age of Onset   CAD Mother    Depression Mother    Heart disease Mother    Hyperlipidemia Mother    Hypertension Mother    CAD Brother    Depression Brother    Diabetes Brother    Heart disease Brother    Hyperlipidemia Brother    Heart disease Father    Alcohol abuse Father      Social History   Tobacco Use   Smoking status: Current Every Day Smoker    Packs/day: 0.50    Years: 30.00    Pack years: 15.00    Types: Cigarettes   Smokeless tobacco: Never Used   Tobacco comment: refused  Substance Use Topics   Alcohol use: Not Currently    Comment: once a year   Drug use: Yes    Frequency: 7.0 times per week    Types: Marijuana    Allergies as of 05/18/2019 - Review Complete 05/16/2019  Allergen Reaction Noted   Ativan [lorazepam]  11/14/2017   Latex Other (See Comments) 02/12/2017   Tape Other (See Comments) 02/12/2017   Drixoral allergy sinus  [dexbromphen-pse-apap er] Rash 01/12/2015    Review of Systems:    All systems reviewed and negative except where noted in HPI.   Physical Exam:  There were no vitals taken for this visit. No LMP recorded. Patient has had a hysterectomy. Psych:  Alert and cooperative. Normal mood and affect. General:   Alert,  Well-developed, well-nourished, pleasant and cooperative in NAD Head:  Normocephalic and atraumatic. Eyes:  Sclera clear, no icterus.   Conjunctiva pink. Ears:  Normal auditory acuity. Nose:  No deformity, discharge, or lesions. Mouth:  No deformity or lesions,oropharynx pink & moist. Neck:  Supple; no masses or thyromegaly. Lungs:  Respirations even and unlabored.  Clear throughout to auscultation.   No wheezes, crackles, or rhonchi. No acute distress. Heart:  Regular rate and rhythm; no murmurs, clicks, rubs, or gallops. Abdomen:  Normal bowel sounds.  No bruits.  Soft, mild left lower quadrant tenderness and non-distended without masses, hepatosplenomegaly or hernias noted.  No guarding or rebound tenderness.    Neurologic:  Alert and oriented x3;  grossly normal neurologically. Skin:  Intact without significant lesions or rashes. No jaundice. Lymph Nodes:  No significant cervical adenopathy. Psych:  Alert and cooperative. Normal mood and affect.  Imaging Studies: Ct Abdomen Pelvis W Contrast  Result Date: 05/16/2019 CLINICAL DATA:  Left lower quadrant abdominal pain and vomiting for 2 days. EXAM: CT ABDOMEN AND PELVIS WITH CONTRAST TECHNIQUE: Multidetector CT imaging of the abdomen and pelvis was performed using the standard protocol following bolus administration of intravenous contrast. CONTRAST:  137mL OMNIPAQUE IOHEXOL 300 MG/ML  SOLN COMPARISON:  01/20/2019 CT abdomen/pelvis. FINDINGS: Lower chest: No significant pulmonary nodules or acute consolidative airspace disease. Hepatobiliary: Normal liver size. No liver mass. Normal gallbladder with no radiopaque cholelithiasis.  No biliary ductal dilatation. Pancreas: Normal, with no mass or duct dilation. Spleen: Normal size. No mass. Adrenals/Urinary Tract: Normal adrenals. Normal kidneys with no hydronephrosis and no renal mass. Normal bladder. Stomach/Bowel: Normal non-distended stomach. Normal caliber small bowel with no small bowel wall thickening. Normal diminutive appearing appendix. There is scattered  mild diverticulosis in the left colon. Status post partial distal colectomy with intact appearing end-to-side distal colonic anastomosis. There is mild wall thickening of the short blind-ending limb of the end to side distal colonic anastomosis with associated faint haziness of surrounding fat, suggesting a nonspecific colitis, with no free air or discrete abscess. Vascular/Lymphatic: Atherosclerotic nonaneurysmal abdominal aorta. Patent portal, splenic, hepatic and renal veins. No pathologically enlarged lymph nodes in the abdomen or pelvis. Reproductive: Status post hysterectomy, with no abnormal findings at the vaginal cuff. No right adnexal mass. Left adnexal 2.6 x 1.5 cm ovoid soft tissue focus adjacent to the perianastomotic thick walled blind-ending distal colonic limb (series 2/image 67), mildly increased from 2.4 x 0.9 cm on 01/20/2019 CT. Other: No pneumoperitoneum, ascites or focal fluid collection. Musculoskeletal: No aggressive appearing focal osseous lesions. IMPRESSION: 1. Status post partial distal colectomy with end-to-side distal colonic anastomosis. New mild wall thickening with surrounding fat stranding involving the short blind-ending limb of the distal colonic anastomosis in the left lower quadrant, suggesting a nonspecific infectious or inflammatory colitis. No free air. No discrete abscess. 2. Hysterectomy. Indeterminate ovoid 2.6 x 1.5 cm left adnexal soft tissue focus adjacent to the perianastomotic thick-walled blind-ending distal colonic limb, mildly increased since 01/20/2019 CT. Findings may represent  reactive inflammation within an adjacent left ovary, although a left ovarian mass cannot be entirely excluded given the interval change. Please correlate with gynecologic surgical history. Recommend short-term follow-up pelvic ultrasound or CT or MRI pelvis without and with IV contrast in 1-2 months. 3.  Aortic Atherosclerosis (ICD10-I70.0). Electronically Signed   By: Ilona Sorrel M.D.   On: 05/16/2019 16:57    Assessment and Plan:   Shawna Anderson is a 53 y.o. y/o female here to see me today for acid reflux ongoing for more than 10 years and a colonoscopy for colon cancer screening average risk.  She also present to the emergency room 2 days back with left lower quadrant pain and a CT scan showed inflammation and an abnormality of her ovary.  She is scheduled to see her GYN in 2 days time to evaluate her ovaries.  She is presently on Protonix once a day which is not helping her.   1.  Increase Protonix to twice a day 2.  I gave her the option to obtain a CT scan today to repeat and see what is going on the left lower quadrant.  She says that the pain is no worse than what it was 2 days back and would like to wait another day or 2.  She is aware that she should call me right away if she has fever or worsening of the pain.  My office will call her on Friday morning to check on her and if the pain is any worse or no better significantly may need a repeat CT scan.  I have also suggested her to commence on MiraLAX as she has not had a recent bowel movement.  I will provide her samples of the same. 3.  I will schedule her for an upper endoscopy as well as a colonoscopy in about 8 weeks time.  The EGD will be performed to evaluate for esophagitis as well as for a hernia and screen for Barrett's esophagus.  I performed a colonoscopy for colon cancer screening and evaluate the abnormality seen on the CAT scan a few days back. 4.  Discussed about lifestyle measures for management of acid reflux, strongly  suggested her to lose some weight,  gave her the option to see a dietitian to modify her diet which she was not too keen about today.  If no better at next visit we will consider Dexilant.  Provided her patient information on lifestyle changes for GERD.  Follow-up in 4 weeks in my office sooner if the pain does not resolve I have discussed alternative options, risks & benefits,  which include, but are not limited to, bleeding, infection, perforation,respiratory complication & drug reaction.  The patient agrees with this plan & written consent will be obtained.     Follow up in 4 weeks  Dr Jonathon Bellows MD,MRCP(U.K)

## 2019-05-18 NOTE — Telephone Encounter (Signed)
Pt has appt 07/12/19 though this past her surgery date. I have sent a message to the [place] office to see if they can schedule her soon.

## 2019-05-18 NOTE — Addendum Note (Signed)
Addended by: Dorethea Clan on: 05/18/2019 10:10 AM   Modules accepted: Orders

## 2019-05-18 NOTE — Patient Instructions (Signed)

## 2019-05-18 NOTE — Telephone Encounter (Signed)
   [facility] Pre-operative Risk Assessment    Request for surgical clearance:  1. What type of surgery is being performed? Upper and lower endoscopy   2. When is this surgery scheduled? 07/11/19   3. What type of clearance is required (medical clearance vs. Pharmacy clearance to hold med vs. Both)? Both  4. Are there any medications that need to be held prior to surgery and how long? Plavix 75 mg, 5 day hold  5. Practice name and name of physician performing surgery? [facility],  Dr. Jonathon Bellows   6. What is your office phone number [phone]    7.   What is your office fax number [phone]  8.   Anesthesia type (None, local, MAC, general) ? General   Rushie Chestnut 05/18/2019, 10:31 AM  _________________________________________________________________   (provider comments below)

## 2019-05-18 NOTE — Telephone Encounter (Signed)
CORRECTION: Pt appt 07/12/19 is with PCP not with our [facility] office

## 2019-05-18 NOTE — Telephone Encounter (Signed)
l mom to schedule surgical clearance.

## 2019-05-18 NOTE — Telephone Encounter (Signed)
   Primary Cardiologist:Christopher End, MD  Chart reviewed as part of pre-operative protocol coverage. Because of Shawna Anderson's past medical history and time since last visit, he/she will require a follow-up visit in order to better assess preoperative cardiovascular risk.  Recently seen by Dr. Saunders Revel for chest pain with instructions for close follow up within one month. Does not appear that this appointment has been made. Will need follow up for chest pain prior to cardiac clearance for procedure.   Pre-op covering staff: - Please schedule appointment and call patient to inform them. - Please contact requesting surgeon's office via preferred method (i.e, phone, fax) to inform them of need for appointment prior to surgery.  If applicable, this message will also be routed to pharmacy pool and/or primary cardiologist for input on holding anticoagulant/antiplatelet agent as requested below so that this information is available at time of patient's appointment.   Kathyrn Drown, NP  05/18/2019, 10:41 AM

## 2019-05-19 NOTE — Telephone Encounter (Signed)
Patient not at home .  Will call back when can look at schedule.

## 2019-05-19 NOTE — Telephone Encounter (Signed)
I reached out to the pt to try and make an appt. She said someone from the [place] office called her as well, though she needed to go home to look at her schedules before she can decide on a date for the appt with Dr. Saunders Revel or his careteam.

## 2019-05-20 ENCOUNTER — Emergency Department
Admission: EM | Admit: 2019-05-20 | Discharge: 2019-05-20 | Disposition: A | Discharge status: Home / Self Care | Payer: Medicare Other | Attending: Emergency Medicine | Admitting: Emergency Medicine

## 2019-05-20 ENCOUNTER — Encounter: Payer: Self-pay | Admitting: Emergency Medicine

## 2019-05-20 ENCOUNTER — Ambulatory Visit: Payer: Medicare Other | Admitting: Podiatry

## 2019-05-20 ENCOUNTER — Other Ambulatory Visit: Payer: Self-pay

## 2019-05-20 ENCOUNTER — Telehealth: Payer: Self-pay

## 2019-05-20 ENCOUNTER — Ambulatory Visit (INDEPENDENT_AMBULATORY_CARE_PROVIDER_SITE_OTHER): Payer: Medicare Other | Admitting: Obstetrics and Gynecology

## 2019-05-20 ENCOUNTER — Emergency Department: Payer: Medicare Other

## 2019-05-20 VITALS — BP 162/90 | Ht 63.0 in | Wt 192.0 lb

## 2019-05-20 DIAGNOSIS — I10 Essential (primary) hypertension: Secondary | ICD-10-CM | POA: Diagnosis not present

## 2019-05-20 DIAGNOSIS — R1030 Lower abdominal pain, unspecified: Secondary | ICD-10-CM

## 2019-05-20 DIAGNOSIS — Z79899 Other long term (current) drug therapy: Secondary | ICD-10-CM | POA: Insufficient documentation

## 2019-05-20 DIAGNOSIS — R1032 Left lower quadrant pain: Secondary | ICD-10-CM | POA: Insufficient documentation

## 2019-05-20 DIAGNOSIS — R197 Diarrhea, unspecified: Secondary | ICD-10-CM

## 2019-05-20 DIAGNOSIS — Z7982 Long term (current) use of aspirin: Secondary | ICD-10-CM | POA: Insufficient documentation

## 2019-05-20 DIAGNOSIS — I2510 Atherosclerotic heart disease of native coronary artery without angina pectoris: Secondary | ICD-10-CM | POA: Insufficient documentation

## 2019-05-20 DIAGNOSIS — L292 Pruritus vulvae: Secondary | ICD-10-CM | POA: Diagnosis not present

## 2019-05-20 DIAGNOSIS — N760 Acute vaginitis: Secondary | ICD-10-CM | POA: Diagnosis not present

## 2019-05-20 DIAGNOSIS — Z90710 Acquired absence of both cervix and uterus: Secondary | ICD-10-CM

## 2019-05-20 DIAGNOSIS — F17210 Nicotine dependence, cigarettes, uncomplicated: Secondary | ICD-10-CM | POA: Insufficient documentation

## 2019-05-20 DIAGNOSIS — J449 Chronic obstructive pulmonary disease, unspecified: Secondary | ICD-10-CM | POA: Diagnosis not present

## 2019-05-20 DIAGNOSIS — Z90721 Acquired absence of ovaries, unilateral: Secondary | ICD-10-CM

## 2019-05-20 LAB — COMPREHENSIVE METABOLIC PANEL
ALT: 28 U/L (ref 0–44)
AST: 24 U/L (ref 15–41)
Albumin: 3.7 g/dL (ref 3.5–5.0)
Alkaline Phosphatase: 79 U/L (ref 38–126)
Anion gap: 10 (ref 5–15)
BUN: 14 mg/dL (ref 6–20)
CO2: 27 mmol/L (ref 22–32)
Calcium: 8.7 mg/dL — ABNORMAL LOW (ref 8.9–10.3)
Chloride: 101 mmol/L (ref 98–111)
Creatinine, Ser: 0.83 mg/dL (ref 0.44–1.00)
GFR calc Af Amer: >60 mL/min (ref >60)
GFR calc non Af Amer: >60 mL/min (ref >60)
Glucose, Bld: 149 mg/dL — ABNORMAL HIGH (ref 70–99)
Potassium: 3.1 mmol/L — ABNORMAL LOW (ref 3.5–5.1)
Sodium: 138 mmol/L (ref 135–145)
Total Bilirubin: 0.3 mg/dL (ref 0.3–1.2)
Total Protein: 6.9 g/dL (ref 6.5–8.1)

## 2019-05-20 LAB — CBC WITH DIFFERENTIAL/PLATELET
Abs Immature Granulocytes: 0.06 10*3/uL (ref 0.00–0.07)
Basophils Absolute: 0.1 10*3/uL (ref 0.0–0.1)
Basophils Relative: 1 %
Eosinophils Absolute: 0.3 10*3/uL (ref 0.0–0.5)
Eosinophils Relative: 3 %
HCT: 36 % (ref 36.0–46.0)
Hemoglobin: 12.2 g/dL (ref 12.0–15.0)
Immature Granulocytes: 1 %
Lymphocytes Relative: 29 %
Lymphs Abs: 3.4 10*3/uL (ref 0.7–4.0)
MCH: 28.9 pg (ref 26.0–34.0)
MCHC: 33.9 g/dL (ref 30.0–36.0)
MCV: 85.3 fL (ref 80.0–100.0)
Monocytes Absolute: 0.6 10*3/uL (ref 0.1–1.0)
Monocytes Relative: 5 %
Neutro Abs: 7.3 10*3/uL (ref 1.7–7.7)
Neutrophils Relative %: 61 %
Platelets: 465 10*3/uL — ABNORMAL HIGH (ref 150–400)
RBC: 4.22 MIL/uL (ref 3.87–5.11)
RDW: 13 % (ref 11.5–15.5)
WBC: 11.7 10*3/uL — ABNORMAL HIGH (ref 4.0–10.5)
nRBC: 0 % (ref 0.0–0.2)

## 2019-05-20 MED ORDER — KETOROLAC TROMETHAMINE 30 MG/ML IJ SOLN
30.0000 mg | Freq: Once | INTRAMUSCULAR | Status: AC
  Administered 2019-05-20: 30 mg via INTRAVENOUS
  Filled 2019-05-20: qty 1

## 2019-05-20 MED ORDER — HYDROMORPHONE HCL 1 MG/ML IJ SOLN
1.0000 mg | Freq: Once | INTRAMUSCULAR | Status: AC
  Administered 2019-05-20: 1 mg via INTRAMUSCULAR
  Filled 2019-05-20: qty 1

## 2019-05-20 NOTE — ED Triage Notes (Signed)
Pt reports was seen here in 2 days and told she had a cyst and colitis. Pt states percocet is not helping her pain and her MD told her to come back to the ED.

## 2019-05-20 NOTE — Telephone Encounter (Signed)
Spoke with pt and informed her of Dr. Georgeann Oppenheim advice. Pt states she has an appointment today to see her OB-GYN regarding the left ovary abnormality seen on recent CT scan. Pt wants to determine if the pain is coming from the ovary. Pt states she will decide to go to the ER based on the outcome of her ob-gyn visit.

## 2019-05-20 NOTE — Progress Notes (Signed)
Patient ID: Shawna Anderson, female   DOB: 06-17-1966, 53 y.o.   MRN: [id number]  Reason for Consult: Follow-up (ER f/u Cyst on left ovary, severe pain, nausea, thought she had a complete hysterectomy )   Referred by McLean-Scocuzza, Olivia Mackie *  Subjective:     HPI:  Shawna Anderson is a 53 y.o. female . She is being seen today as an ER follow up. She had a recent CT which showed a possible left ovarian mass. This has been confusing for the patient because she reports a hysterectomy and bilateral salpingo-oophorectomy for a  History of recurrent ovarian cysts. She reports that in the past she had at least 6 surgeries for recurrent ruptured ovarian cyst and she has had a bowel resection for a spontaneous bowel rupture.  She reports that for 3 days before she was seen in the ER she was having severe pain and nausea. She took zofran for 3 days to try and get her vomiting controlled so that she could be seen in the ER. She was seen in the ER on 05/16/2019 and had a CT but was discharged home after.   She saw Dr. Humphrey Rolls earlier this week. He wanted her to repeat imaging, but she wanted to wait for this office appointment before doing that. She is having a difficult time staying seated during the visit.   Patient having severe abdominal pain which has not been controlled with percocet at home.  She has been having watery bowel movements and has seen some blood in the stool.  She denies fevers.   She says even before these issues she wanted to schedule a GYN visit for abnormal vaginal discharge and itching. She used over the counter monistat but this did not help.    Past Medical History:  Diagnosis Date  . Anxiety   . Asthma   . Bipolar 1 disorder ([place])   . Bipolar disorder ([place])   . CAD (coronary artery disease)    s/p stent BMS OM Cx  . Cervical herniated disc 04/12/2016  . COPD (chronic obstructive pulmonary disease) ([place])   . Depression   . Diabetes mellitus without complication  ([place])   . Diverticulitis   . GERD (gastroesophageal reflux disease)   . Glaucoma   . History of blood transfusion   . Hyperlipidemia   . Hypertension   . OSA (obstructive sleep apnea)    not using cpap   . Plantar fasciitis    b/l feet s/p steroid shots w/o help and left surgery w/o help   . UTI (urinary tract infection)    Family History  Problem Relation Age of Onset  . CAD Mother   . Depression Mother   . Heart disease Mother   . Hyperlipidemia Mother   . Hypertension Mother   . CAD Brother   . Depression Brother   . Diabetes Brother   . Heart disease Brother   . Hyperlipidemia Brother   . Heart disease Father   . Alcohol abuse Father    Past Surgical History:  Procedure Laterality Date  . ABDOMINAL HYSTERECTOMY    . ABDOMINAL SURGERY  1995   Bowel resection.  Marland Kitchen CARDIAC CATHETERIZATION N/A 04/14/2016   Procedure: Left Heart Cath and Coronary Angiography;  Surgeon: Burnell Blanks, MD;  Location: [facility];  Service: Cardiovascular;  Laterality: N/A;  . CARDIAC SURGERY    . CORONARY ANGIOPLASTY WITH STENT PLACEMENT  2010   Drug eluting stent  . OVARIAN CYST  REMOVAL      Short Social History:  Social History   Tobacco Use  . Smoking status: Current Every Day Smoker    Packs/day: 0.50    Years: 30.00    Pack years: 15.00    Types: Cigarettes  . Smokeless tobacco: Never Used  . Tobacco comment: refused  Substance Use Topics  . Alcohol use: Not Currently    Comment: once a year    Allergies  Allergen Reactions  . Ativan [Lorazepam]     Pt states it makes her tongue do weird things   . Latex Other (See Comments)    Patient stated that she was told by her doctor that she is "allergic to" this  . Tape Other (See Comments)    Patient stated that she was told by her doctor that she is "allergic to" this  . Drixoral Allergy Sinus [Dexbromphen-Pse-Apap Er] Rash    Current Outpatient Medications  Medication Sig Dispense Refill  . albuterol  (PROVENTIL) (2.5 MG/3ML) 0.083% nebulizer solution Take 3 mLs (2.5 mg total) by nebulization every 6 (six) hours as needed for wheezing or shortness of breath. 360 mL 12  . Albuterol Sulfate (PROAIR RESPICLICK) 123XX123 (90 Base) MCG/ACT AEPB Inhale 1 Inhaler into the lungs every 4 (four) hours as needed. 3 each 4  . ALPRAZolam (XANAX) 0.25 MG tablet Take 1 tablet (0.25 mg total) by mouth at bedtime as needed for anxiety or sleep. 30 tablet 2  . amLODipine (NORVASC) 5 MG tablet Take 1 tablet (5 mg total) by mouth at bedtime. 90 tablet 3  . aspirin EC 81 MG tablet Take 1 tablet (81 mg total) by mouth daily. 90 tablet 3  . atorvastatin (LIPITOR) 80 MG tablet Take 1 tablet (80 mg total) by mouth daily at 6 PM. 90 tablet 3  . Cholecalciferol (VITAMIN D-3) 125 MCG (5000 UT) TABS Take by mouth. 1 pill daily and MWF 2 pills x 6 months then back to 1 pill daily starting month 7    . ciprofloxacin (CIPRO) 500 MG tablet Take 1 tablet (500 mg total) by mouth 2 (two) times daily for 7 days. 14 tablet 0  . clopidogrel (PLAVIX) 75 MG tablet Take 1 tablet (75 mg total) by mouth daily. 90 tablet 3  . cyclobenzaprine (FLEXERIL) 5 MG tablet Take 1 tablet (5 mg total) by mouth at bedtime as needed for muscle spasms. 30 tablet 0  . dicyclomine (BENTYL) 10 MG capsule Take 1 capsule (10 mg total) by mouth 4 (four) times daily -  before meals and at bedtime. 120 capsule 2  . divalproex (DEPAKOTE) 500 MG DR tablet Take 1 tablet (500 mg total) by mouth 2 (two) times daily. 180 tablet 1  . ipratropium-albuterol (DUONEB) 0.5-2.5 (3) MG/3ML SOLN Take 3 mLs by nebulization every 6 (six) hours as needed. 360 mL 11  . lidocaine (LIDODERM) 5 % Place 1 patch onto the skin daily. Remove & Discard patch within 12 hours or as directed by MD 30 patch 0  . metoprolol succinate (TOPROL-XL) 50 MG 24 hr tablet Take 1 tablet (50 mg total) by mouth daily. Take with or immediately following a meal. 90 tablet 3  . metroNIDAZOLE (FLAGYL) 500 MG tablet  Take 1 tablet (500 mg total) by mouth 2 (two) times daily after a meal. 14 tablet 0  . nitroGLYCERIN (NITROSTAT) 0.4 MG SL tablet Place 1 tablet (0.4 mg total) under the tongue every 5 (five) minutes as needed for chest pain. X 2 call  911 after 3rd dose 30 tablet 5  . olmesartan-hydrochlorothiazide (BENICAR HCT) 40-25 MG tablet Take 1 tablet by mouth daily. In am 90 tablet 3  . ondansetron (ZOFRAN-ODT) 4 MG disintegrating tablet Take 1 tablet (4 mg total) by mouth every 8 (eight) hours as needed for nausea or vomiting. 90 tablet 0  . oxyCODONE-acetaminophen (PERCOCET) 5-325 MG tablet Take 1 tablet by mouth every 8 (eight) hours as needed for severe pain. 20 tablet 0  . pantoprazole (PROTONIX) 40 MG tablet Take 1 tablet (40 mg total) by mouth 2 (two) times daily. In am before food 60 tablet 5  . senna-docusate (SENOKOT-S) 8.6-50 MG tablet Take 1-2 tablets by mouth daily as needed for mild constipation. 90 tablet 1  . traZODone (DESYREL) 150 MG tablet Take 1 tablet (150 mg total) by mouth at bedtime as needed for sleep. 90 tablet 3  . valACYclovir (VALTREX) 1000 MG tablet Take 1 tablet (1,000 mg total) by mouth 3 (three) times daily. 21 tablet 0   No current facility-administered medications for this visit.     Review of Systems  Constitutional: Negative for chills, fatigue, fever and unexpected weight change.  HENT: Negative for trouble swallowing.  Eyes: Negative for loss of vision.  Respiratory: Negative for cough, shortness of breath and wheezing.  Cardiovascular: Negative for chest pain, leg swelling, palpitations and syncope.  GI: Negative for abdominal pain, blood in stool, diarrhea, nausea and vomiting.  GU: Negative for difficulty urinating, dysuria, frequency and hematuria.  Musculoskeletal: Negative for back pain, leg pain and joint pain.  Skin: Negative for rash.  Neurological: Negative for dizziness, headaches, light-headedness, numbness and seizures.  Psychiatric: Negative for  behavioral problem, confusion, depressed mood and sleep disturbance.        Objective:  Objective   Vitals:   05/20/19 1053  BP: (!) 162/90  Weight: 192 lb (87.1 kg)  Height: 5\' 3"  (1.6 m)   Body mass index is 34.01 kg/m.  Physical Exam Vitals signs and nursing note reviewed.  Constitutional:      Appearance: She is well-developed.     Comments: Patient smells strongly of marijuana  HENT:     Head: Normocephalic and atraumatic.  Eyes:     Pupils: Pupils are equal, round, and reactive to light.  Cardiovascular:     Rate and Rhythm: Normal rate and regular rhythm.  Pulmonary:     Effort: Pulmonary effort is normal. No respiratory distress.  Abdominal:     General: There is distension.     Palpations: There is no mass.     Tenderness: There is abdominal tenderness. There is guarding and rebound.     Hernia: No hernia is present.  Genitourinary:    Comments: External: Normal appearing vulva. No lesions noted.  Speculum examination: Normal appearing vaginal cuff. Normal scant white discharge.   Bimanual examination: Uterus absent. No adnexal masses. Left adnexal tenderness. Pelvis not fixed.    Skin:    General: Skin is warm and dry.  Neurological:     Mental Status: She is alert and oriented to person, place, and time.  Psychiatric:        Behavior: Behavior normal.        Thought Content: Thought content normal.        Judgment: Judgment normal.         Assessment/Plan:    53 yo with severe abdominal pain.   Spoke with Dr. Bailey Mech about her on the phone, possibly of colitis or issue with  anastamosis- recommended patient going to the ER for repeat CT scan and stool studies. Pelvic US would be helpful to access for possible ovarian remnant however she reports she had both ovaries removed. She is contacting the hospital in [place] where the surgery was done for records release.  Nuswab for complaints of vaginal itching and discharge, pelvic exam shows normal white vaginal  discharge, intact vaginal cuff, no blood on speculum examination. Tender diffusely on pelvic exam left more than right, No adnexal masses palpated.  Dr. Jimmye Norman in ER contacted and patient's case discussed with him.  Patient is calling hospital in [place] where her hysterectomy was performed for a copy of her records which I can review.   Given history general surgery and GI follow up will be most appropriate.   More than 45 minutes were spent face to face with the patient in the room with more than 50% of the time spent providing counseling and discussing the plan of management.    Adrian Prows MD [facility] 05/20/2019 1:44 PM

## 2019-05-20 NOTE — Telephone Encounter (Signed)
1. Repeat CT scan if having severe pain  2. Take miralax daily not as needed- 1-2 capfuls a day  3. Check on her Monday  4. If pain gets severe: go to ER

## 2019-05-20 NOTE — ED Notes (Addendum)
See triage note  States her pain meds are not helping  Pt is moaning and rubbing stomach  States pain started in [facility]

## 2019-05-20 NOTE — ED Provider Notes (Signed)
[facility] Emergency Department Provider Note  ____________________________________________  Time seen: Approximately 7:46 PM  I have reviewed the triage vital signs and the nursing notes.   HISTORY  Chief Complaint Abdominal Pain    HPI Shawna Anderson is a 53 y.o. female with a history of diverticulitis presents to the emergency department with left lower quadrant abdominal pain that has occurred for approximately 1 week.  Patient was seen and evaluated on 05/16/2019 and was diagnosed with colitis.  Patient reports that she has been taking Cipro and Flagyl as instructed.  She denies worsening abdominal pain but states the pain is persistent and she is here for pain management.  She denies fever or chills.  No nausea or vomiting at home.  No hematochezia.  No other alleviating measures have been attempted.        Past Medical History:  Diagnosis Date  . Anxiety   . Asthma   . Bipolar 1 disorder ([place])   . Bipolar disorder ([place])   . CAD (coronary artery disease)    s/p stent BMS OM Cx  . Cervical herniated disc 04/12/2016  . COPD (chronic obstructive pulmonary disease) ([place])   . Depression   . Diabetes mellitus without complication ([place])   . Diverticulitis   . GERD (gastroesophageal reflux disease)   . Glaucoma   . History of blood transfusion   . Hyperlipidemia   . Hypertension   . OSA (obstructive sleep apnea)    not using cpap   . Plantar fasciitis    b/l feet s/p steroid shots w/o help and left surgery w/o help   . UTI (urinary tract infection)     Patient Active Problem List   Diagnosis Date Noted  . HLD (hyperlipidemia) 12/27/2018  . Vitamin D deficiency 12/24/2018  . Bipolar disorder ([place]) 12/30/2017  . OSA (obstructive sleep apnea) 12/30/2017  . Constipation 12/30/2017  . Coronary artery disease of native artery of native heart with stable angina pectoris ([place]) 12/30/2017  . Gastroesophageal reflux disease 12/30/2017  . Asthma  12/29/2017  . COPD (chronic obstructive pulmonary disease) ([place]) 12/29/2017  . Anxiety and depression 12/29/2017  . Insomnia 12/29/2017  . Chronic back pain 12/29/2017  . Skin lesion of back 12/29/2017  . CAD S/P CFX PCI 2010 04/15/2016  . Essential hypertension 04/15/2016  . Dyslipidemia 04/15/2016  . Noncompliance with medication regimen 04/15/2016  . Chest pain with moderate risk of acute coronary syndrome 04/12/2016  . Abdominal pain 01/12/2015  . Vomiting 01/12/2015  . Nausea and vomiting 01/12/2015    Past Surgical History:  Procedure Laterality Date  . ABDOMINAL HYSTERECTOMY    . ABDOMINAL SURGERY  1995   Bowel resection.  Marland Kitchen CARDIAC CATHETERIZATION N/A 04/14/2016   Procedure: Left Heart Cath and Coronary Angiography;  Surgeon: Burnell Blanks, MD;  Location: [facility];  Service: Cardiovascular;  Laterality: N/A;  . CARDIAC SURGERY    . CORONARY ANGIOPLASTY WITH STENT PLACEMENT  2010   Drug eluting stent  . OVARIAN CYST REMOVAL      Prior to Admission medications   Medication Sig Start Date End Date Taking? Authorizing Provider  albuterol (PROVENTIL) (2.5 MG/3ML) 0.083% nebulizer solution Take 3 mLs (2.5 mg total) by nebulization every 6 (six) hours as needed for wheezing or shortness of breath. 04/06/19   McLean-Scocuzza, Nino Glow, MD  Albuterol Sulfate (PROAIR RESPICLICK) 123XX123 (90 Base) MCG/ACT AEPB Inhale 1 Inhaler into the lungs every 4 (four) hours as needed. 04/14/19  McLean-Scocuzza, Nino Glow, MD  ALPRAZolam Duanne [place]on) 0.25 MG tablet Take 1 tablet (0.25 mg total) by mouth at bedtime as needed for anxiety or sleep. 11/04/18   McLean-Scocuzza, Nino Glow, MD  amLODipine (NORVASC) 5 MG tablet Take 1 tablet (5 mg total) by mouth at bedtime. 04/06/19   McLean-Scocuzza, Nino Glow, MD  aspirin EC 81 MG tablet Take 1 tablet (81 mg total) by mouth daily. 11/04/18   McLean-Scocuzza, Nino Glow, MD  atorvastatin (LIPITOR) 80 MG tablet Take 1 tablet (80 mg total) by mouth daily at 6  PM. 04/06/19 07/05/19  McLean-Scocuzza, Nino Glow, MD  Cholecalciferol (VITAMIN D-3) 125 MCG (5000 UT) TABS Take by mouth. 1 pill daily and MWF 2 pills x 6 months then back to 1 pill daily starting month 7    [provider]  ciprofloxacin (CIPRO) 500 MG tablet Take 1 tablet (500 mg total) by mouth 2 (two) times daily for 7 days. 05/16/19 05/23/19  Lavonia Drafts, MD  clopidogrel (PLAVIX) 75 MG tablet Take 1 tablet (75 mg total) by mouth daily. 04/06/19   McLean-Scocuzza, Nino Glow, MD  cyclobenzaprine (FLEXERIL) 5 MG tablet Take 1 tablet (5 mg total) by mouth at bedtime as needed for muscle spasms. 01/20/19   McLean-Scocuzza, Nino Glow, MD  dicyclomine (BENTYL) 10 MG capsule Take 1 capsule (10 mg total) by mouth 4 (four) times daily -  before meals and at bedtime. 05/18/19 08/16/19  Jonathon Bellows, MD  divalproex (DEPAKOTE) 500 MG DR tablet Take 1 tablet (500 mg total) by mouth 2 (two) times daily. 04/06/19   McLean-Scocuzza, Nino Glow, MD  ipratropium-albuterol (DUONEB) 0.5-2.5 (3) MG/3ML SOLN Take 3 mLs by nebulization every 6 (six) hours as needed. 12/29/17   McLean-Scocuzza, Nino Glow, MD  lidocaine (LIDODERM) 5 % Place 1 patch onto the skin daily. Remove & Discard patch within 12 hours or as directed by MD 11/16/18   McLean-Scocuzza, Nino Glow, MD  metoprolol succinate (TOPROL-XL) 50 MG 24 hr tablet Take 1 tablet (50 mg total) by mouth daily. Take with or immediately following a meal. 04/06/19   McLean-Scocuzza, Nino Glow, MD  metroNIDAZOLE (FLAGYL) 500 MG tablet Take 1 tablet (500 mg total) by mouth 2 (two) times daily after a meal. 05/16/19   Lavonia Drafts, MD  nitroGLYCERIN (NITROSTAT) 0.4 MG SL tablet Place 1 tablet (0.4 mg total) under the tongue every 5 (five) minutes as needed for chest pain. X 2 call 911 after 3rd dose 11/04/18   McLean-Scocuzza, Nino Glow, MD  olmesartan-hydrochlorothiazide (BENICAR HCT) 40-25 MG tablet Take 1 tablet by mouth daily. In am 04/06/19   McLean-Scocuzza, Nino Glow, MD  ondansetron  (ZOFRAN-ODT) 4 MG disintegrating tablet Take 1 tablet (4 mg total) by mouth every 8 (eight) hours as needed for nausea or vomiting. 04/06/19   McLean-Scocuzza, Nino Glow, MD  oxyCODONE-acetaminophen (PERCOCET) 5-325 MG tablet Take 1 tablet by mouth every 8 (eight) hours as needed for severe pain. 05/16/19 05/15/20  Lavonia Drafts, MD  pantoprazole (PROTONIX) 40 MG tablet Take 1 tablet (40 mg total) by mouth 2 (two) times daily. In am before food 05/18/19 11/14/19  Jonathon Bellows, MD  senna-docusate (SENOKOT-S) 8.6-50 MG tablet Take 1-2 tablets by mouth daily as needed for mild constipation. 02/18/19   McLean-Scocuzza, Nino Glow, MD  traZODone (DESYREL) 150 MG tablet Take 1 tablet (150 mg total) by mouth at bedtime as needed for sleep. 04/06/19   McLean-Scocuzza, Nino Glow, MD  valACYclovir (VALTREX) 1000 MG tablet Take 1 tablet (1,000 mg  total) by mouth 3 (three) times daily. 11/16/18   McLean-Scocuzza, Nino Glow, MD    Allergies Ativan [lorazepam], Latex, Tape, and Drixoral allergy sinus [dexbromphen-pse-apap er]  Family History  Problem Relation Age of Onset  . CAD Mother   . Depression Mother   . Heart disease Mother   . Hyperlipidemia Mother   . Hypertension Mother   . CAD Brother   . Depression Brother   . Diabetes Brother   . Heart disease Brother   . Hyperlipidemia Brother   . Heart disease Father   . Alcohol abuse Father     Social History Social History   Tobacco Use  . Smoking status: Current Every Day Smoker    Packs/day: 0.50    Years: 30.00    Pack years: 15.00    Types: Cigarettes  . Smokeless tobacco: Never Used  . Tobacco comment: refused  Substance Use Topics  . Alcohol use: Not Currently    Comment: once a year  . Drug use: Yes    Frequency: 7.0 times per week    Types: Marijuana     Review of Systems  Constitutional: No fever/chills Eyes: No visual changes. No discharge ENT: No upper respiratory complaints. Cardiovascular: no chest pain. Respiratory: no cough. No  SOB. Gastrointestinal: Patient has abdominal pain.  No nausea, no vomiting.  No diarrhea.  No constipation. Genitourinary: Negative for dysuria. No hematuria Musculoskeletal: Negative for musculoskeletal pain. Skin: Negative for rash, abrasions, lacerations, ecchymosis. Neurological: Negative for headaches, focal weakness or numbness.  ____________________________________________   PHYSICAL EXAM:  VITAL SIGNS: ED Triage Vitals  Enc Vitals Group     BP 05/20/19 1420 106/64     Pulse Rate 05/20/19 1420 77     Resp 05/20/19 1420 20     Temp 05/20/19 1422 98.5 F (36.9 C)     Temp Source 05/20/19 1422 Oral     SpO2 05/20/19 1420 96 %     Weight 05/20/19 1420 187 lb (84.8 kg)     Height 05/20/19 1420 5\' 3"  (1.6 m)     Head Circumference --      [facility] Flow --      Pain Score 05/20/19 1420 7     Pain Loc --      Pain Edu? --      Excl. in [employer]? --      Constitutional: Alert and oriented. Well appearing and in no acute distress. Eyes: Conjunctivae are normal. PERRL. EOMI. Head: Atraumatic. ENT: Cardiovascular: Normal rate, regular rhythm. Normal S1 and S2.  Good peripheral circulation. Respiratory: Normal respiratory effort without tachypnea or retractions. Lungs CTAB. Good air entry to the bases with no decreased or absent breath sounds. Gastrointestinal: Bowel sounds 4 quadrants.  Patient has tenderness to palpation in left lower quadrant but no guarding.  No palpable masses. No distention. No CVA tenderness. Musculoskeletal: Full range of motion to all extremities. No gross deformities appreciated. Neurologic:  Normal speech and language. No gross focal neurologic deficits are appreciated.  Skin:  Skin is warm, dry and intact. No rash noted. Psychiatric: Mood and affect are normal. Speech and behavior are normal. Patient exhibits appropriate insight and judgement.   ____________________________________________   LABS (all labs ordered are listed, but only abnormal results  are displayed)  Labs Reviewed  COMPREHENSIVE METABOLIC PANEL - Abnormal; Notable for the following components:      Result Value   Potassium 3.1 (*)    Glucose, Bld 149 (*)    Calcium 8.7 (*)  All other components within normal limits  CBC WITH DIFFERENTIAL/PLATELET - Abnormal; Notable for the following components:   WBC 11.7 (*)    Platelets 465 (*)    All other components within normal limits   ____________________________________________  EKG   ____________________________________________  RADIOLOGY I personally viewed and evaluated these images as part of my medical decision making, as well as reviewing the written report by the radiologist.  US Pelvis Complete  Result Date: 05/20/2019 CLINICAL DATA:  Left lower quadrant pain for 3 days EXAM: TRANSABDOMINAL ULTRASOUND OF PELVIS TECHNIQUE: Transabdominal ultrasound examination of the pelvis was performed including evaluation of the uterus, ovaries, adnexal regions, and pelvic cul-de-sac. COMPARISON:  CT abdomen pelvis 05/16/2019 FINDINGS: Uterus Surgically absent Endometrium Surgically absent Right ovary Not visualized. Left ovary Not visualized. Other findings: In the left adnexa is a hypoechoic area measuring 2.8 x 2.0 x 2.6 cm with an echogenic, posteriorly shadowing structure possibly reflecting the calcification seen on comparison CT imaging. IMPRESSION: The uterus is surgically absent. Neither ovary is definitively visualized. Patient is reportedly post hysterectomy and states both ovaries are surgically absent as well. Correlate with surgical history and operative notes. Hypoattenuating collection with a shadowing calcification seen in the left adnexa adjacent the pelvic sidewall corresponds well to a collection seen on comparison CT imaging 4 days prior. Overall appearance remains indeterminate possibly postsurgical or a lesion of ovarian/adnexal origin. Recommend follow-up CT or MR imaging in 1-2 months. Electronically Signed    By: Lovena Le M.D.   On: 05/20/2019 18:55    ____________________________________________    PROCEDURES  Procedure(s) performed:    Procedures    Medications  ketorolac (TORADOL) 30 MG/ML injection 30 mg (30 mg Intravenous Given 05/20/19 1806)  HYDROmorphone (DILAUDID) injection 1 mg (1 mg Intramuscular Given 05/20/19 1838)     ____________________________________________   INITIAL IMPRESSION / ASSESSMENT AND PLAN / ED COURSE  Pertinent labs & imaging results that were available during my care of the patient were reviewed by me and considered in my medical decision making (see chart for details).  Review of the [place] CSRS was performed in accordance of the [place] prior to dispensing any controlled drugs.           Assessment and Plan:  Abdominal pain 53 year old female presents to the emergency department with left-sided lower quadrant abdominal pain that is been present for least a week.  Patient states that the intensity of her pain has not changed but she is concerned as it is still present.  She states that she has primarily in the emergency department for pain management and would like either Dilaudid or morphine.  She states that Toradol never works for her.  Patient was mildly bradycardic at triage but vital signs were otherwise reassuring.  With distraction, patient had no abdominal guarding.  She did complain of some tenderness to palpation in the left lower quadrant.  Differential diagnosis included pelvic mass, pelvic abscess, worsening colitis...  Patient's white blood cell count has trended down from 15.7-11.7.  Pelvic ultrasound findings were consistent with CT abdomen and pelvis.  I recommended follow-up with gynecology in 1 to 2 months for repeat imaging.    She reported that her pain completely resolved after a one time dose of Dilaudid was administered and was observed ambulating throughout ED without apparent distress.  Patient was cautioned that  Dilaudid would not be readministered should she return to the emergency department with similar symptoms. All patient questions were answered.    ____________________________________________  FINAL CLINICAL IMPRESSION(S) / ED DIAGNOSES  Final diagnoses:  Left lower quadrant abdominal pain      NEW MEDICATIONS STARTED DURING THIS VISIT:  ED Discharge Orders    None          This chart was dictated using voice recognition software/Dragon. Despite best efforts to proofread, errors can occur which can change the meaning. Any change was purely unintentional.    Lannie Fields, PA-C 05/20/19 Basil Dess, MD 05/20/19 2029

## 2019-05-20 NOTE — Telephone Encounter (Signed)
Pt has been set up to see Dr. Saunders Revel 06/22/19 in our [place] office for surgery clearance. I will route clearance information to Dr. Saunders Revel for appt. I will remove from the pre op call back pool.

## 2019-05-20 NOTE — Telephone Encounter (Signed)
Called pt to follow up and enquire on how she is doing. Pt states she is still experiencing severe abdominal pain. Pt states she's only able to have a bowel movement when she takes the Miralax but even when she has a bowel movement the pain doesn't ease up. Pt states the pain is becoming unbearable and requests Dr. Georgeann Oppenheim advice on what she should do.

## 2019-05-23 LAB — NUSWAB BV AND CANDIDA, NAA
Candida albicans, NAA: NEGATIVE
Candida glabrata, NAA: NEGATIVE

## 2019-05-26 ENCOUNTER — Other Ambulatory Visit: Payer: Self-pay

## 2019-05-26 ENCOUNTER — Encounter: Payer: Self-pay | Admitting: Internal Medicine

## 2019-05-26 ENCOUNTER — Ambulatory Visit (INDEPENDENT_AMBULATORY_CARE_PROVIDER_SITE_OTHER): Payer: Medicare Other | Admitting: Internal Medicine

## 2019-05-26 VITALS — Ht 63.0 in | Wt 187.0 lb

## 2019-05-26 DIAGNOSIS — R102 Pelvic and perineal pain: Secondary | ICD-10-CM | POA: Insufficient documentation

## 2019-05-26 DIAGNOSIS — K529 Noninfective gastroenteritis and colitis, unspecified: Secondary | ICD-10-CM | POA: Diagnosis not present

## 2019-05-26 NOTE — Progress Notes (Signed)
Telephone Note  I connected with [facility]   on 05/26/19 at 11:30 AM EST by telephone and verified that I am speaking with the correct person using two identifiers.  Location patient: home Location provider:work or home office Persons participating in the virtual visit: patient, provider  I discussed the limitations of evaluation and management by telemedicine and the availability of in person appointments. The patient expressed understanding and agreed to proceed.   HPI: ED f/u  1. Abdominal pain/female pelvic pain CT +colitis and indeterminate left lesion per pt s/p hysterectomy on cipro 500 mg bid, flagyl 500 mg bid x 1 week since 05/16/2019 bentyl which are helping and 0/10 ab pain today she thought diluaidid >percocet helped with abdominal pain colonoscopy sch 07/11/19 Dr. Bailey Mech pt wants to cancel appt upcoming 05/30/19 b/c doing better   MRI or CT pelvis is rec in 1-2 months from [place] 05/20/2019 to f/u indeterminate lesion in left pelvic region will CC ob/gyn [facility] to see if will f/u on this    ROS: See pertinent positives and negatives per HPI.  Past Medical History:  Diagnosis Date  . Anxiety   . Asthma   . Bipolar 1 disorder ([place])   . Bipolar disorder ([place])   . CAD (coronary artery disease)    s/p stent BMS OM Cx  . Cervical herniated disc 04/12/2016  . COPD (chronic obstructive pulmonary disease) ([place])   . Depression   . Diabetes mellitus without complication ([place])   . Diverticulitis   . GERD (gastroesophageal reflux disease)   . Glaucoma   . History of blood transfusion   . Hyperlipidemia   . Hypertension   . OSA (obstructive sleep apnea)    not using cpap   . Plantar fasciitis    b/l feet s/p steroid shots w/o help and left surgery w/o help   . UTI (urinary tract infection)     Past Surgical History:  Procedure Laterality Date  . ABDOMINAL HYSTERECTOMY    . ABDOMINAL SURGERY  1995   Bowel resection.  Marland Kitchen CARDIAC CATHETERIZATION N/A 04/14/2016    Procedure: Left Heart Cath and Coronary Angiography;  Surgeon: Burnell Blanks, MD;  Location: [facility];  Service: Cardiovascular;  Laterality: N/A;  . CARDIAC SURGERY    . CORONARY ANGIOPLASTY WITH STENT PLACEMENT  2010   Drug eluting stent  . OVARIAN CYST REMOVAL      Family History  Problem Relation Age of Onset  . CAD Mother   . Depression Mother   . Heart disease Mother   . Hyperlipidemia Mother   . Hypertension Mother   . CAD Brother   . Depression Brother   . Diabetes Brother   . Heart disease Brother   . Hyperlipidemia Brother   . Heart disease Father   . Alcohol abuse Father     SOCIAL HX: lives at home with son    Current Outpatient Medications:  .  albuterol (PROVENTIL) (2.5 MG/3ML) 0.083% nebulizer solution, Take 3 mLs (2.5 mg total) by nebulization every 6 (six) hours as needed for wheezing or shortness of breath., Disp: 360 mL, Rfl: 12 .  Albuterol Sulfate (PROAIR RESPICLICK) 123XX123 (90 Base) MCG/ACT AEPB, Inhale 1 Inhaler into the lungs every 4 (four) hours as needed., Disp: 3 each, Rfl: 4 .  ALPRAZolam (XANAX) 0.25 MG tablet, Take 1 tablet (0.25 mg total) by mouth at bedtime as needed for anxiety or sleep., Disp: 30 tablet, Rfl: 2 .  amLODipine (NORVASC) 5 MG tablet,  Take 1 tablet (5 mg total) by mouth at bedtime., Disp: 90 tablet, Rfl: 3 .  aspirin EC 81 MG tablet, Take 1 tablet (81 mg total) by mouth daily., Disp: 90 tablet, Rfl: 3 .  atorvastatin (LIPITOR) 80 MG tablet, Take 1 tablet (80 mg total) by mouth daily at 6 PM., Disp: 90 tablet, Rfl: 3 .  Cholecalciferol (VITAMIN D-3) 125 MCG (5000 UT) TABS, Take by mouth. 1 pill daily and MWF 2 pills x 6 months then back to 1 pill daily starting month 7, Disp: , Rfl:  .  clopidogrel (PLAVIX) 75 MG tablet, Take 1 tablet (75 mg total) by mouth daily., Disp: 90 tablet, Rfl: 3 .  cyclobenzaprine (FLEXERIL) 5 MG tablet, Take 1 tablet (5 mg total) by mouth at bedtime as needed for muscle spasms., Disp: 30  tablet, Rfl: 0 .  dicyclomine (BENTYL) 10 MG capsule, Take 1 capsule (10 mg total) by mouth 4 (four) times daily -  before meals and at bedtime., Disp: 120 capsule, Rfl: 2 .  divalproex (DEPAKOTE) 500 MG DR tablet, Take 1 tablet (500 mg total) by mouth 2 (two) times daily., Disp: 180 tablet, Rfl: 1 .  ipratropium-albuterol (DUONEB) 0.5-2.5 (3) MG/3ML SOLN, Take 3 mLs by nebulization every 6 (six) hours as needed., Disp: 360 mL, Rfl: 11 .  lidocaine (LIDODERM) 5 %, Place 1 patch onto the skin daily. Remove & Discard patch within 12 hours or as directed by MD, Disp: 30 patch, Rfl: 0 .  metoprolol succinate (TOPROL-XL) 50 MG 24 hr tablet, Take 1 tablet (50 mg total) by mouth daily. Take with or immediately following a meal., Disp: 90 tablet, Rfl: 3 .  metroNIDAZOLE (FLAGYL) 500 MG tablet, Take 1 tablet (500 mg total) by mouth 2 (two) times daily after a meal., Disp: 14 tablet, Rfl: 0 .  nitroGLYCERIN (NITROSTAT) 0.4 MG SL tablet, Place 1 tablet (0.4 mg total) under the tongue every 5 (five) minutes as needed for chest pain. X 2 call 911 after 3rd dose, Disp: 30 tablet, Rfl: 5 .  olmesartan-hydrochlorothiazide (BENICAR HCT) 40-25 MG tablet, Take 1 tablet by mouth daily. In am, Disp: 90 tablet, Rfl: 3 .  ondansetron (ZOFRAN-ODT) 4 MG disintegrating tablet, Take 1 tablet (4 mg total) by mouth every 8 (eight) hours as needed for nausea or vomiting., Disp: 90 tablet, Rfl: 0 .  pantoprazole (PROTONIX) 40 MG tablet, Take 1 tablet (40 mg total) by mouth 2 (two) times daily. In am before food, Disp: 60 tablet, Rfl: 5 .  senna-docusate (SENOKOT-S) 8.6-50 MG tablet, Take 1-2 tablets by mouth daily as needed for mild constipation., Disp: 90 tablet, Rfl: 1 .  traZODone (DESYREL) 150 MG tablet, Take 1 tablet (150 mg total) by mouth at bedtime as needed for sleep., Disp: 90 tablet, Rfl: 3 .  valACYclovir (VALTREX) 1000 MG tablet, Take 1 tablet (1,000 mg total) by mouth 3 (three) times daily., Disp: 21 tablet, Rfl:  0  EXAM:  VITALS per patient if applicable:  GENERAL: alert, oriented, appears well and in no acute distress  PSYCH/NEURO: pleasant and cooperative, no obvious depression or anxiety, speech and thought processing grossly intact  ASSESSMENT AND PLAN:  Discussed the following assessment and plan:  Colitis -complete MND and Flagyl 1 week course started 05/16/2019  Bentyl prn qid  F/u GI and sch colonoscopy 07/11/2019   Female pelvic pain -will need repeat MR or CT pelvis in 1-2 months from 05/20/2019 and f/u ob/gyn  S/p hysterectomy pt requested records from  [place] to see if both ovaries removed it is inconclusive   HM Declines flu shot  Tdap will do in future   Consider pna 23, shingrix and in future if has not had   S/p hysterectomy will ask at f/u if had abnormal pap ? If left ovary still intact right ovary appears out per imaging  -established [facility]   Dr. Jonathon Bellows colonoscopy sch 07/11/19 and  EGD with issues.  H/o sigmoid resection in past for h/o diverticulitis  -referredmammogramhas not scheduled yetas of 11/12/2020encouraged to schedule   rec smoking cessation smoking < or = 0.5 ppd also using THC  -we discussed possible serious and likely etiologies, options for evaluation and workup, limitations of telemedicine visit vs in person visit, treatment, treatment risks and precautions. Pt prefers to treat via telemedicine empirically rather then risking or undertaking an in person visit at this moment. Patient agrees to seek prompt in person care if worsening, new symptoms arise, or if is not improving with treatment.   I discussed the assessment and treatment plan with the patient. The patient was provided an opportunity to ask questions and all were answered. The patient agreed with the plan and demonstrated an understanding of the instructions.   The patient was advised to call back or seek an in-person evaluation if the symptoms worsen or if the condition  fails to improve as anticipated.  Time spent 20 minutes  Delorise Jackson, MD

## 2019-05-27 ENCOUNTER — Encounter: Payer: Self-pay | Admitting: Internal Medicine

## 2019-05-30 ENCOUNTER — Ambulatory Visit: Payer: Medicare Other | Admitting: Gastroenterology

## 2019-06-13 ENCOUNTER — Other Ambulatory Visit: Payer: Self-pay

## 2019-06-13 ENCOUNTER — Encounter: Payer: Self-pay | Admitting: Internal Medicine

## 2019-06-13 DIAGNOSIS — Z20822 Contact with and (suspected) exposure to covid-19: Secondary | ICD-10-CM

## 2019-06-15 LAB — NOVEL CORONAVIRUS, NAA: SARS-CoV-2, NAA: NOT DETECTED

## 2019-06-21 ENCOUNTER — Other Ambulatory Visit: Payer: Self-pay

## 2019-06-21 NOTE — Patient Outreach (Signed)
[facility]) Care Management  06/21/2019  Shawna Anderson 1966-06-10 [id number]   Medication Adherence call to [name] spoke with patient she is past due on Olmesartan/Hctz 40/25 mg,patient explain she is now taking Olmesartan/Hctz  80/25 doctor increase the dose,patient was taking 2 tablets of Olmesatan/Hctz 40/25 and has  11 more days and will start ordering from [facility].[name] is showing past due under [name].   [place] Management Direct Dial [phone]  Fax [phone] [name]

## 2019-06-22 ENCOUNTER — Ambulatory Visit: Payer: Medicare Other | Admitting: Internal Medicine

## 2019-06-27 ENCOUNTER — Telehealth: Payer: Self-pay | Admitting: Internal Medicine

## 2019-06-27 NOTE — Telephone Encounter (Signed)
Pt needs new rx sent to [facility] , they have advised her that they only have 40MG  on file and not 80Mg /  Pt also asked if her Medication traZODone (DESYREL) 150 MG tablet can be refilled / please advise

## 2019-06-27 NOTE — Telephone Encounter (Signed)
Call pt  When she runs out of refills she needs to call [facility] not [place]  She has trazadone since 03/2019 for 1 year supply   What medication do they only have as 40 mg and not 80 mg this does not make sense not enough details    Shawna Anderson

## 2019-06-28 ENCOUNTER — Other Ambulatory Visit: Payer: Self-pay | Admitting: Internal Medicine

## 2019-06-28 DIAGNOSIS — I10 Essential (primary) hypertension: Secondary | ICD-10-CM

## 2019-06-28 MED ORDER — OLMESARTAN MEDOXOMIL-HCTZ 40-25 MG PO TABS: 1.0000 | ORAL_TABLET | Freq: Every day | ORAL | 3 refills | Status: DC

## 2019-06-28 NOTE — Telephone Encounter (Signed)
This does not come in 80 mg benicar hct only comes 40-25  Inform pt

## 2019-06-28 NOTE — Telephone Encounter (Signed)
[facility] called and said pt needs 80 mg dosage for olmesartan-hydrochlorothiazide prescription they only have 40-25 mg dosage to give her.

## 2019-06-29 ENCOUNTER — Encounter: Payer: Self-pay | Admitting: Nurse Practitioner

## 2019-06-29 ENCOUNTER — Ambulatory Visit (INDEPENDENT_AMBULATORY_CARE_PROVIDER_SITE_OTHER): Payer: Medicare Other | Admitting: Physician Assistant

## 2019-06-29 ENCOUNTER — Other Ambulatory Visit: Payer: Self-pay

## 2019-06-29 ENCOUNTER — Telehealth: Payer: Self-pay | Admitting: Internal Medicine

## 2019-06-29 ENCOUNTER — Other Ambulatory Visit: Payer: Self-pay | Admitting: Internal Medicine

## 2019-06-29 VITALS — BP 140/80 | HR 82 | Temp 97.8°F | Ht 63.0 in | Wt 191.0 lb

## 2019-06-29 DIAGNOSIS — R079 Chest pain, unspecified: Secondary | ICD-10-CM

## 2019-06-29 DIAGNOSIS — R109 Unspecified abdominal pain: Secondary | ICD-10-CM

## 2019-06-29 DIAGNOSIS — E876 Hypokalemia: Secondary | ICD-10-CM

## 2019-06-29 DIAGNOSIS — F329 Major depressive disorder, single episode, unspecified: Secondary | ICD-10-CM

## 2019-06-29 DIAGNOSIS — I25118 Atherosclerotic heart disease of native coronary artery with other forms of angina pectoris: Secondary | ICD-10-CM

## 2019-06-29 DIAGNOSIS — J4520 Mild intermittent asthma, uncomplicated: Secondary | ICD-10-CM | POA: Diagnosis not present

## 2019-06-29 DIAGNOSIS — I10 Essential (primary) hypertension: Secondary | ICD-10-CM

## 2019-06-29 DIAGNOSIS — F419 Anxiety disorder, unspecified: Secondary | ICD-10-CM

## 2019-06-29 DIAGNOSIS — E785 Hyperlipidemia, unspecified: Secondary | ICD-10-CM

## 2019-06-29 DIAGNOSIS — K219 Gastro-esophageal reflux disease without esophagitis: Secondary | ICD-10-CM

## 2019-06-29 DIAGNOSIS — F32A Depression, unspecified: Secondary | ICD-10-CM

## 2019-06-29 DIAGNOSIS — G4733 Obstructive sleep apnea (adult) (pediatric): Secondary | ICD-10-CM

## 2019-06-29 DIAGNOSIS — Z01810 Encounter for preprocedural cardiovascular examination: Secondary | ICD-10-CM

## 2019-06-29 MED ORDER — ISOSORBIDE MONONITRATE ER 30 MG PO TB24: 30.0000 mg | ORAL_TABLET | Freq: Every day | ORAL | 3 refills | Status: DC

## 2019-06-29 MED ORDER — AMLODIPINE BESYLATE 10 MG PO TABS: 10.0000 mg | ORAL_TABLET | Freq: Every day | ORAL | 3 refills | Status: DC

## 2019-06-29 NOTE — Telephone Encounter (Signed)
Please have her hold Plavix 7d before colonoscopy with recommendations regarding ASA and restart of DAPT per GI as outlined in today's visit note.  Given she will not have labs performed by her PCP, she will need to get an updated BMET /Mg before her procedure. To avoid repeat trips to the lab, let's also recheck her lipids.    (1) Stop Plavix 7d before colonoscopy. Recommendations regarding restart per GI. She can continue ASA unless indicated by GI. (2) Obtain labs: BMET, Mg, lipids. (3) Preop evaluation to be sent to GI/PCP.

## 2019-06-29 NOTE — Telephone Encounter (Signed)
Increase norvasc 10 mg daily from 5 mg daily sent new Rx [facility]  Benicar max dose os 40-25 so not more than this  She needs to sch lab visit BMET and magnesium here nonfasting before her colonoscopy And get clearance from cardiology not me before colonoscopy   [place]

## 2019-06-29 NOTE — Telephone Encounter (Signed)
Routing to provider who saw patient today for advice.

## 2019-06-29 NOTE — Patient Instructions (Signed)
Medication Instructions:  1- START Imdur Take 1 tablet (30 mg total) by mouth daily. *If you need a refill on your cardiac medications before your next appointment, please call your pharmacy*  Lab Work: None ordered  Please have PCP fax results to our office [phone] If you have labs (blood work) drawn today and your tests are completely normal, you will receive your results only by: Marland Kitchen MyChart Message (if you have MyChart) OR . A paper copy in the mail If you have any lab test that is abnormal or we need to change your treatment, we will call you to review the results.  Testing/Procedures: None ordered   Follow-Up: At [facility], you and your health needs are our priority.  As part of our continuing mission to provide you with exceptional heart care, we have created designated Provider Care Teams.  These Care Teams include your primary Cardiologist (physician) and Advanced Practice Providers (APPs -  Physician Assistants and Nurse Practitioners) who all work together to provide you with the care you need, when you need it.  Your next appointment:   2-3 week(s)  The format for your next appointment:   In Person  Provider:    You may see Nelva Bush, MD or Marrianne Mood, PA-C.

## 2019-06-29 NOTE — Telephone Encounter (Signed)
Patient calling States she also forgot to mention she will have a procedure on 12/28 and will need to know when to stop blood thinner Have not received a clearance request  Please call to discuss

## 2019-06-29 NOTE — Progress Notes (Signed)
Office Visit    Patient Name: Shawna Anderson Date of Encounter: 06/29/2019  Primary Care Provider:  McLean-Scocuzza, Nino Glow, MD Primary Cardiologist:  Nelva Bush, MD  Chief Complaint    53 year old female with history of coronary artery disease (2010, BMS to LCx; 2017 restenosis of LCx), hypertension, hyperlipidemia, GERD, sleep apnea, asthma, current smoker with both tobacco and marijuana use, BipolarI/anxiety, and who is being seen today for cardiac evaluation before colonoscopy.  Past Medical History    Past Medical History:  Diagnosis Date  . Anxiety   . Asthma   . Bipolar 1 disorder ([place])   . Bipolar disorder ([place])   . CAD (coronary artery disease)    s/p stent BMS OM Cx  . Cervical herniated disc 04/12/2016  . COPD (chronic obstructive pulmonary disease) ([place])   . Depression   . Diabetes mellitus without complication ([place])   . Diverticulitis   . GERD (gastroesophageal reflux disease)   . Glaucoma   . History of blood transfusion   . Hyperlipidemia   . Hypertension   . OSA (obstructive sleep apnea)    not using cpap   . Plantar fasciitis    b/l feet s/p steroid shots w/o help and left surgery w/o help   . UTI (urinary tract infection)    Past Surgical History:  Procedure Laterality Date  . ABDOMINAL HYSTERECTOMY    . ABDOMINAL SURGERY  1995   Bowel resection.  Marland Kitchen CARDIAC CATHETERIZATION N/A 04/14/2016   Procedure: Left Heart Cath and Coronary Angiography;  Surgeon: Burnell Blanks, MD;  Location: [facility];  Service: Cardiovascular;  Laterality: N/A;  . CARDIAC SURGERY    . CORONARY ANGIOPLASTY WITH STENT PLACEMENT  2010   Drug eluting stent  . OVARIAN CYST REMOVAL      Allergies  Allergies  Allergen Reactions  . Ativan [Lorazepam]     Pt states it makes her tongue do weird things   . Latex Other (See Comments)    Patient stated that she was told by her doctor that she is "allergic to" this  . Tape Other (See Comments)   Patient stated that she was told by her doctor that she is "allergic to" this  . Drixoral Allergy Sinus [Dexbromphen-Pse-Apap Er] Rash    History of Present Illness    53 year old female who is being seen today for cardiac evaluation with PMH as above.  Her CAD was first discovered during preop work-up for prior ovarian cyst removal in 2010.  BMS was placed to the ostial/proximal left circumflex.  She was admitted to [facility]/2016 for chest pain in the setting of having been off multiple medications due to financial constraints.  Catheterization at that time revealed moderate in-stent restenosis in the ostial/proximal left circumflex stent.  Medical management was recommended.  She underwent stress test for continued chest pain with exertion that showed a small, subtle, partially reversible basal inferior lateral defect felt most consistent with artifact but could not rule out subtle scar with peri-infarct ischemia.  LVEF was hyperdynamic.  Subsequent echo was notable for LVEF with G1DD.  She was last seen 04/04/2019, at which time she continued to report frequent chest pain since 2012, most often at rest.  She reported it would often wake her at night.  She also felt it with light activities, such as cleaning around her house.  She was using SL nitro 3-6 times per month in the preceding few months.  She described the discomfort  as stabbing in quality with maximum intensity 10/10, occasionally radiating to the neck, and usually resolving over the course of 15 to 20 minutes.  She also noted associated "gagging" without emesis or nausea.  Recommendation at the time of this visit was to continue to pursue medical therapy, including recently prescribed olmesartan-HCTZ for BP and DAPT.  If symptoms persisted, repeat catheterization would need to be encouraged.  Smoking cessation was also advised. She was lost to follow-up after that time. Most recently, she was seen in the ED 05/20/2019 for LLQ pain x1 week and  recently diagnosed colitis.  She was noted to be mildly bradycardic but otherwise vital signs stable.  Recommendation was for follow-up with gynecology.  Since that time, the patient reports continued yet stable chest pain with both exertion and at rest, as well as SOB/DOE.  She reports frequent use of SL nitro with most recent use yesterday.  She states the chest pain is unchanged from previous CP reported. It is located across her entire anterior chest and frequently radiates into both upper extremities or up the left side of her neck. She reports these episodes as lasting only minutes each time.  She also reports unchanged / chronic SOB/DOE that is further described as occurring with cleaning, walking on level ground at less than 1 block, or going up stairs.  She states that, even if she stops to rest, she cannot restart an activity again. She does note that she does not feel that her chest pain or SOB/DOE have progressed since her previous visit or even since her 2017 catheterization.  She notes that she continues to try to lose weight without result but no increased weights, LEE, abdominal distention, or orthopnea. She also denies racing HR, palpitations, presyncope, or syncope. No s/sx of bleeding on DAPT.  She reports her upcoming colonoscopy is for her ongoing GI issues. She states she continues to smoke/use tobacco and also reports daily marijuana use (3 blunts per day).  She drinks alcohol socially with most recent alcohol use reported as yesterday for a friend celebration (1 beer). She is not using her CPAP but denies waking up short of breath at night.  She reports increased home stressors, stating that several family members are currently staying with her in her home.  She does note that she has been staying safe and distancing herself, given the current pandemic.  She reports medication compliance; however, given that she was running late for today's appointment, she did not take her morning  medications with clinic BP 140/80 today.  She has documented home blood pressures with SBP 110s to 130s.  Home Medications    Prior to Admission medications   Medication Sig Start Date End Date Taking? Authorizing Provider  albuterol (PROVENTIL) (2.5 MG/3ML) 0.083% nebulizer solution Take 3 mLs (2.5 mg total) by nebulization every 6 (six) hours as needed for wheezing or shortness of breath. 04/06/19   McLean-Scocuzza, Nino Glow, MD  Albuterol Sulfate (PROAIR RESPICLICK) 123XX123 (90 Base) MCG/ACT AEPB Inhale 1 Inhaler into the lungs every 4 (four) hours as needed. 04/14/19   McLean-Scocuzza, Nino Glow, MD  ALPRAZolam Duanne Moron) 0.25 MG tablet Take 1 tablet (0.25 mg total) by mouth at bedtime as needed for anxiety or sleep. 11/04/18   McLean-Scocuzza, Nino Glow, MD  amLODipine (NORVASC) 5 MG tablet Take 1 tablet (5 mg total) by mouth at bedtime. 04/06/19   McLean-Scocuzza, Nino Glow, MD  aspirin EC 81 MG tablet Take 1 tablet (81 mg total) by mouth daily.  11/04/18   McLean-Scocuzza, Nino Glow, MD  atorvastatin (LIPITOR) 80 MG tablet Take 1 tablet (80 mg total) by mouth daily at 6 PM. 04/06/19 07/05/19  McLean-Scocuzza, Nino Glow, MD  Cholecalciferol (VITAMIN D-3) 125 MCG (5000 UT) TABS Take by mouth. 1 pill daily and MWF 2 pills x 6 months then back to 1 pill daily starting month 7    [provider]  clopidogrel (PLAVIX) 75 MG tablet Take 1 tablet (75 mg total) by mouth daily. 04/06/19   McLean-Scocuzza, Nino Glow, MD  cyclobenzaprine (FLEXERIL) 5 MG tablet Take 1 tablet (5 mg total) by mouth at bedtime as needed for muscle spasms. 01/20/19   McLean-Scocuzza, Nino Glow, MD  dicyclomine (BENTYL) 10 MG capsule Take 1 capsule (10 mg total) by mouth 4 (four) times daily -  before meals and at bedtime. 05/18/19 08/16/19  Jonathon Bellows, MD  divalproex (DEPAKOTE) 500 MG DR tablet Take 1 tablet (500 mg total) by mouth 2 (two) times daily. 04/06/19   McLean-Scocuzza, Nino Glow, MD  ipratropium-albuterol (DUONEB) 0.5-2.5 (3) MG/3ML SOLN Take  3 mLs by nebulization every 6 (six) hours as needed. 12/29/17   McLean-Scocuzza, Nino Glow, MD  lidocaine (LIDODERM) 5 % Place 1 patch onto the skin daily. Remove & Discard patch within 12 hours or as directed by MD 11/16/18   McLean-Scocuzza, Nino Glow, MD  metoprolol succinate (TOPROL-XL) 50 MG 24 hr tablet Take 1 tablet (50 mg total) by mouth daily. Take with or immediately following a meal. 04/06/19   McLean-Scocuzza, Nino Glow, MD  metroNIDAZOLE (FLAGYL) 500 MG tablet Take 1 tablet (500 mg total) by mouth 2 (two) times daily after a meal. 05/16/19   Lavonia Drafts, MD  nitroGLYCERIN (NITROSTAT) 0.4 MG SL tablet Place 1 tablet (0.4 mg total) under the tongue every 5 (five) minutes as needed for chest pain. X 2 call 911 after 3rd dose 11/04/18   McLean-Scocuzza, Nino Glow, MD  olmesartan-hydrochlorothiazide (BENICAR HCT) 40-25 MG tablet Take 1 tablet by mouth daily. 06/28/19   McLean-Scocuzza, Nino Glow, MD  ondansetron (ZOFRAN-ODT) 4 MG disintegrating tablet Take 1 tablet (4 mg total) by mouth every 8 (eight) hours as needed for nausea or vomiting. 04/06/19   McLean-Scocuzza, Nino Glow, MD  pantoprazole (PROTONIX) 40 MG tablet Take 1 tablet (40 mg total) by mouth 2 (two) times daily. In am before food 05/18/19 11/14/19  Jonathon Bellows, MD  senna-docusate (SENOKOT-S) 8.6-50 MG tablet Take 1-2 tablets by mouth daily as needed for mild constipation. 02/18/19   McLean-Scocuzza, Nino Glow, MD  traZODone (DESYREL) 150 MG tablet Take 1 tablet (150 mg total) by mouth at bedtime as needed for sleep. 04/06/19   McLean-Scocuzza, Nino Glow, MD  valACYclovir (VALTREX) 1000 MG tablet Take 1 tablet (1,000 mg total) by mouth 3 (three) times daily. 11/16/18   McLean-Scocuzza, Nino Glow, MD    Review of Systems    She denies palpitations, pnd, orthopnea, v, dizziness, syncope, edema, or early satiety. She reports CP, dyspnea, n, and weight gain.  All other systems reviewed and are otherwise negative except as noted above.  Physical Exam    VS:   BP 140/80 (BP Location: Left Arm, Patient Position: Sitting, Cuff Size: Normal)   Pulse 82   Temp 97.8 F (36.6 C)   Ht 5\' 3"  (1.6 m)   Wt 191 lb (86.6 kg)   SpO2 95%   BMI 33.83 kg/m  , BMI Body mass index is 33.83 kg/m. GEN: Well nourished, well developed, in no  acute distress. HEENT: normal. Neck: Supple, no JVD, carotid bruits, or masses. Cardiac: RRR, 2/6 systolic murmur best appreciated RUSB. No rubs or gallops. No clubbing, cyanosis. No b/l lower extremity edema.  Radials/DP/PT 2+ and equal bilaterally.  Respiratory:  Respirations regular and unlabored, clear to auscultation bilaterally. GI: Soft, nontender, nondistended, BS + x 4. MS: no deformity or atrophy. Skin: warm and dry, no rash. Neuro:  Strength and sensation are intact. Psych: Normal affect.  Anxious.  Accessory Clinical Findings    ECG personally reviewed by me today - NSR, 82bpm, poor conduction in III, no acute changes from previous - no acute changes.  Echo 03/16/2019  1. The left ventricle has normal systolic function with an ejection fraction of 60-65%. The cavity size was normal. Left ventricular diastolic Doppler parameters are consistent with impaired relaxation.  2. The right ventricle has normal systolic function. The cavity was normal. There is no increase in right ventricular wall thickness.Unable to estimate RVSP.  04/2016 LHC  Mid RCA lesion, 30 %stenosed.  Ost RPDA to RPDA lesion, 20 %stenosed.  Mid RCA to Dist RCA lesion, 10 %stenosed.  Ost Cx to Prox Cx lesion, 50 %stenosed.  Prox LAD to Mid LAD lesion, 20 %stenosed.  The left ventricular ejection fraction is greater than 65% by visual estimate.  The left ventricular systolic function is normal.  LV end diastolic pressure is normal.  There is no mitral valve regurgitation.   1. Single vessel CAD  2. Patent stent in the ostium of the Circumflex with moderate restenosis. This does not appear to be flow limiting. 3. Mild  non-obstructive disease in the RCA and LAD.  4. Normal LV function  Na 138, K 3.1, Cr 0.83, BUN 14, AST 24, ALT 28, WBC 11.7, Hgb 12.2, RBC 4.22  Assessment & Plan    Preop cardiovascular evaluation, colonoscopy --Continued report of unchanged CP and SOB/DOE with most recent SL nitro use yesterday.  Able to do ADLs but unable to walk 1-2 level blocks, climb a flight of stairs, walk briskly, or engage in any exercise or heavy housework. Per patient, sx are unchanged since 2017 catheterization. At today's visit, she declines further ischemic workup of her continued chest pain with LHC and prefers to first trial addition of long acting nitrate.  --Given her symptoms are unchanged at this time, we will defer cath for now and add Imdur 30mg  with plan to revisit her CP at follow-up. Surgery specific risk low (<1%). Duke activity status index 7.2. RCRI Class II risk with 0.9% risk of MACE. --Given less than 1 percent risk and stable symptoms, reasonable to proceed with colonoscopy without further cardiac testing.  ----Regarding ASA therapy, we recommend continuation of ASA throughout the procedure unless otherwise indicated by GI. If ASA is held it should be resumed as soon as felt to be feasible after the procedure. Recommend discontinuing clopidogrel 7 days prior to colonoscopy and restart should also be done as soon as it is felt safe to do so after surgery.  --Will forward recommendations to GI / PCP, including recommendations for DAPT.  New systolic murmur --New 2/6 systolic murmur heard on exam (not previously documented) and best appreciated in RUSB. 03/2019 echo without evidence of valvular dz. Patient declines further workup at this time. Reassess at follow-up.   Single Vessel CAD s/p PCI to LCx (2010, 2017) --Continued / unchanged daily CP and SOB as above. Last nitro use yesterday. Stable since 2017 with low risk 02/2019 stress test and most  recent echo showing EF 60-65%.  Denies any sx of heart  failure. Consider multifactorial etiology of CP given untreated OSA, ongoing colitis / GI etiology, and known asthma with current tobacco and marijuana use. Smoking cessation advised, as well as CPAP. Recommended today that she undergo further ischemic workup with cardiac catheterization given her continued CP, risks factors for cardiac etiology, and new systolic murmur heard on exam. She declines further workup at this time and will reconsider after her colonoscopy procedure. Imdur 30mg  daily added today for ongoing CP and will revisit need for further workup at follow-up.  Hypokalemia --K 3.1. Declined repeat labs today. PCP sent staff message to notify of patient plan to repeat BMET/Mg via PCP before colonoscopy. Further recommendations regarding K supplementation pending repeat labs.  Anemia --Per PCP. Stable at 12.7  12.2 at this time. GI workup pending. Continue to monitor.  HTN --Improved on current medications. No longer taking Toprol XL. Reports home SBP 110-130s mmHg. Office BP 140/80 today with patient stating she did not take AM antihypertensives. Continue amlodipine and olmesartan-HCTZ with Imdur added as above. Continue to monitor at home.   HLD --Poorly controlled. Recommend recheck of LDL per PCP. Previous 12/2017 LDL 193 with goal LDL below 70. Continue atorvastatin. Consider addition of Zetia if repeat LDL still not at goal.  Current smoker, tobacco use, marijuana use --Counseled on cessation. Patient reported she does not wish to quit at this time due to home stressors.   DM2 --A1C 6.1. Recommend follow-up with PCP and dietary changes +/- medication for more optimal glycemic control and risk factor modification.  Personal stressors, Anxiety, Bipolar 1 --Recommend decrease stressors and increase physical activity as tolerated.  OSA not on CPAP --Given associated cardiovascular risks associated with untreated sleep apnea, recommend CPAP use. Consider untreated sleep apnea may  also be contributing to her symptoms above.  Asthma --Continue inhalers. Consider as contributing to sx.  ----  Disposition:   Follow-up in 2-3 weeks. Hold Plavix 7d before colonoscopy with recommendations regarding ASA and restart of DAPT per GI. Obtain BMET, Mg through PCP. Repeat lipids at next follow-up, if not obtained before that time by PCP. Consider Zetia. Start Imdur 30mg  daily today. Reassess CP and need for LHC at f/u.    Arvil Chaco, PA-C 06/29/2019, 9:31 AM

## 2019-06-29 NOTE — Telephone Encounter (Signed)
Patient is having a colonoscopy on 07/11/2019 and believes she needs to have clearance from Dr. Olivia Mackie to have. Please advise patient.

## 2019-06-29 NOTE — Telephone Encounter (Signed)
Patient calling in regarding blood work. In appointment earlier today patient declined blood work due to having it done in another office, that ended up not happening. Patient would like [facility] provider to order correct blood work and let patient know what to do.  Please advise

## 2019-06-29 NOTE — Telephone Encounter (Signed)
She needs clearance from cardiology not me which is what I think appt was for the other day  She needs cardiology to do this not me call cardiology for clearance cardiology   [place]

## 2019-06-29 NOTE — Telephone Encounter (Signed)
Patient wants to know if she needs new rx or does she need to take two aday. norvasc she is taking at night.

## 2019-06-30 NOTE — Telephone Encounter (Signed)
Call to patient to discuss POC from Elenor Quinones, PA>   Pt verbalized understanding and all questions were answered.  Lab orders placed.   Advised pt to call for any further questions or concerns.

## 2019-07-01 NOTE — Telephone Encounter (Signed)
Patient was informed.  Patient understood and no questions, comments, or concerns at this time. She will call to schedule.

## 2019-07-04 ENCOUNTER — Telehealth: Payer: Self-pay | Admitting: Gastroenterology

## 2019-07-04 ENCOUNTER — Encounter: Payer: Self-pay | Admitting: Internal Medicine

## 2019-07-04 ENCOUNTER — Other Ambulatory Visit: Payer: Self-pay

## 2019-07-04 MED ORDER — DICYCLOMINE HCL 10 MG PO CAPS: 10.0000 mg | ORAL_CAPSULE | Freq: Three times a day (TID) | ORAL | 2 refills | Status: DC

## 2019-07-04 NOTE — Telephone Encounter (Signed)
Pt left vm she has a scheduled procedure for 07/11/19 and is supposed to go to her Covid19 test 07/07/19 she had one done on 06/13/19 and would like to see if she can use that

## 2019-07-04 NOTE — Telephone Encounter (Signed)
Spoke with pt and informed her that she would still need to go for her COVID test on 07-07-19 as the test has to be completed within 5 days prior to the scheduled procedure. Pt understands and agrees.

## 2019-07-05 ENCOUNTER — Other Ambulatory Visit: Payer: Self-pay | Admitting: Internal Medicine

## 2019-07-05 ENCOUNTER — Other Ambulatory Visit: Payer: Self-pay

## 2019-07-05 DIAGNOSIS — F419 Anxiety disorder, unspecified: Secondary | ICD-10-CM

## 2019-07-05 DIAGNOSIS — G4700 Insomnia, unspecified: Secondary | ICD-10-CM

## 2019-07-05 MED ORDER — ISOSORBIDE MONONITRATE ER 30 MG PO TB24: 30.0000 mg | ORAL_TABLET | Freq: Every day | ORAL | 0 refills | Status: DC

## 2019-07-05 MED ORDER — ALPRAZOLAM 0.25 MG PO TABS: 0.2500 mg | ORAL_TABLET | Freq: Every evening | ORAL | 2 refills | Status: DC | PRN

## 2019-07-07 ENCOUNTER — Other Ambulatory Visit
Admission: RE | Admit: 2019-07-07 | Discharge: 2019-07-07 | Disposition: A | Discharge status: Home / Self Care | Payer: Medicare Other | Source: Ambulatory Visit | Attending: Internal Medicine | Admitting: Internal Medicine

## 2019-07-07 ENCOUNTER — Other Ambulatory Visit: Payer: Self-pay

## 2019-07-07 ENCOUNTER — Other Ambulatory Visit
Admission: RE | Admit: 2019-07-07 | Discharge: 2019-07-07 | Disposition: A | Discharge status: Home / Self Care | Payer: Medicare Other | Source: Ambulatory Visit | Attending: Gastroenterology | Admitting: Gastroenterology

## 2019-07-07 ENCOUNTER — Other Ambulatory Visit: Payer: Self-pay | Admitting: Internal Medicine

## 2019-07-07 DIAGNOSIS — Z01812 Encounter for preprocedural laboratory examination: Secondary | ICD-10-CM | POA: Diagnosis present

## 2019-07-07 DIAGNOSIS — E876 Hypokalemia: Secondary | ICD-10-CM

## 2019-07-07 DIAGNOSIS — Z20828 Contact with and (suspected) exposure to other viral communicable diseases: Secondary | ICD-10-CM | POA: Insufficient documentation

## 2019-07-07 DIAGNOSIS — Z13220 Encounter for screening for lipoid disorders: Secondary | ICD-10-CM

## 2019-07-07 DIAGNOSIS — I10 Essential (primary) hypertension: Secondary | ICD-10-CM

## 2019-07-07 LAB — MAGNESIUM: Magnesium: 2.3 mg/dL (ref 1.7–2.4)

## 2019-07-07 LAB — BASIC METABOLIC PANEL
Anion gap: 10 (ref 5–15)
BUN: 13 mg/dL (ref 6–20)
CO2: 26 mmol/L (ref 22–32)
Calcium: 8.9 mg/dL (ref 8.9–10.3)
Chloride: 105 mmol/L (ref 98–111)
Creatinine, Ser: 0.69 mg/dL (ref 0.44–1.00)
GFR calc Af Amer: >60 mL/min (ref >60)
GFR calc non Af Amer: >60 mL/min (ref >60)
Glucose, Bld: 110 mg/dL — ABNORMAL HIGH (ref 70–99)
Potassium: 4 mmol/L (ref 3.5–5.1)
Sodium: 141 mmol/L (ref 135–145)

## 2019-07-07 LAB — LIPID PANEL
Cholesterol: 236 mg/dL — ABNORMAL HIGH (ref 0–200)
HDL: 39 mg/dL — ABNORMAL LOW (ref >40)
LDL Cholesterol: 149 mg/dL — ABNORMAL HIGH (ref 0–99)
Total CHOL/HDL Ratio: 6.1 RATIO
Triglycerides: 238 mg/dL — ABNORMAL HIGH (ref <150)
VLDL: 48 mg/dL — ABNORMAL HIGH (ref 0–40)

## 2019-07-07 LAB — SARS CORONAVIRUS 2 (TAT 6-24 HRS): SARS Coronavirus 2: NEGATIVE

## 2019-07-11 ENCOUNTER — Encounter
Admission: RE | Disposition: A | Discharge status: Home / Self Care | Payer: Self-pay | Source: Home / Self Care | Attending: Gastroenterology

## 2019-07-11 ENCOUNTER — Encounter: Payer: Self-pay | Admitting: Gastroenterology

## 2019-07-11 ENCOUNTER — Other Ambulatory Visit: Payer: Self-pay

## 2019-07-11 ENCOUNTER — Ambulatory Visit: Payer: Medicare Other | Admitting: Certified Registered Nurse Anesthetist

## 2019-07-11 ENCOUNTER — Ambulatory Visit
Admission: RE | Admit: 2019-07-11 | Discharge: 2019-07-11 | Disposition: A | Discharge status: Home / Self Care | Payer: Medicare Other | Attending: Gastroenterology | Admitting: Gastroenterology

## 2019-07-11 DIAGNOSIS — Z91040 Latex allergy status: Secondary | ICD-10-CM | POA: Diagnosis not present

## 2019-07-11 DIAGNOSIS — Z90710 Acquired absence of both cervix and uterus: Secondary | ICD-10-CM | POA: Insufficient documentation

## 2019-07-11 DIAGNOSIS — Z8249 Family history of ischemic heart disease and other diseases of the circulatory system: Secondary | ICD-10-CM | POA: Insufficient documentation

## 2019-07-11 DIAGNOSIS — Z7982 Long term (current) use of aspirin: Secondary | ICD-10-CM | POA: Diagnosis not present

## 2019-07-11 DIAGNOSIS — Z5309 Procedure and treatment not carried out because of other contraindication: Secondary | ICD-10-CM | POA: Diagnosis not present

## 2019-07-11 DIAGNOSIS — Z818 Family history of other mental and behavioral disorders: Secondary | ICD-10-CM | POA: Insufficient documentation

## 2019-07-11 DIAGNOSIS — Z8349 Family history of other endocrine, nutritional and metabolic diseases: Secondary | ICD-10-CM | POA: Insufficient documentation

## 2019-07-11 DIAGNOSIS — J449 Chronic obstructive pulmonary disease, unspecified: Secondary | ICD-10-CM | POA: Diagnosis not present

## 2019-07-11 DIAGNOSIS — K2970 Gastritis, unspecified, without bleeding: Secondary | ICD-10-CM | POA: Diagnosis not present

## 2019-07-11 DIAGNOSIS — E785 Hyperlipidemia, unspecified: Secondary | ICD-10-CM | POA: Diagnosis not present

## 2019-07-11 DIAGNOSIS — R933 Abnormal findings on diagnostic imaging of other parts of digestive tract: Secondary | ICD-10-CM

## 2019-07-11 DIAGNOSIS — K219 Gastro-esophageal reflux disease without esophagitis: Secondary | ICD-10-CM | POA: Diagnosis not present

## 2019-07-11 DIAGNOSIS — Z91048 Other nonmedicinal substance allergy status: Secondary | ICD-10-CM | POA: Insufficient documentation

## 2019-07-11 DIAGNOSIS — K222 Esophageal obstruction: Secondary | ICD-10-CM | POA: Insufficient documentation

## 2019-07-11 DIAGNOSIS — E669 Obesity, unspecified: Secondary | ICD-10-CM | POA: Insufficient documentation

## 2019-07-11 DIAGNOSIS — Z79899 Other long term (current) drug therapy: Secondary | ICD-10-CM | POA: Insufficient documentation

## 2019-07-11 DIAGNOSIS — Z7689 Persons encountering health services in other specified circumstances: Secondary | ICD-10-CM | POA: Diagnosis not present

## 2019-07-11 DIAGNOSIS — M722 Plantar fascial fibromatosis: Secondary | ICD-10-CM | POA: Insufficient documentation

## 2019-07-11 DIAGNOSIS — Z888 Allergy status to other drugs, medicaments and biological substances status: Secondary | ICD-10-CM | POA: Diagnosis not present

## 2019-07-11 DIAGNOSIS — G4733 Obstructive sleep apnea (adult) (pediatric): Secondary | ICD-10-CM | POA: Insufficient documentation

## 2019-07-11 DIAGNOSIS — F17210 Nicotine dependence, cigarettes, uncomplicated: Secondary | ICD-10-CM | POA: Insufficient documentation

## 2019-07-11 DIAGNOSIS — E119 Type 2 diabetes mellitus without complications: Secondary | ICD-10-CM | POA: Insufficient documentation

## 2019-07-11 DIAGNOSIS — Z7902 Long term (current) use of antithrombotics/antiplatelets: Secondary | ICD-10-CM | POA: Insufficient documentation

## 2019-07-11 DIAGNOSIS — Z955 Presence of coronary angioplasty implant and graft: Secondary | ICD-10-CM | POA: Insufficient documentation

## 2019-07-11 DIAGNOSIS — F319 Bipolar disorder, unspecified: Secondary | ICD-10-CM | POA: Diagnosis not present

## 2019-07-11 DIAGNOSIS — Z6833 Body mass index (BMI) 33.0-33.9, adult: Secondary | ICD-10-CM | POA: Diagnosis not present

## 2019-07-11 DIAGNOSIS — I252 Old myocardial infarction: Secondary | ICD-10-CM | POA: Insufficient documentation

## 2019-07-11 DIAGNOSIS — Z1211 Encounter for screening for malignant neoplasm of colon: Secondary | ICD-10-CM | POA: Insufficient documentation

## 2019-07-11 DIAGNOSIS — F419 Anxiety disorder, unspecified: Secondary | ICD-10-CM | POA: Insufficient documentation

## 2019-07-11 DIAGNOSIS — Z833 Family history of diabetes mellitus: Secondary | ICD-10-CM | POA: Insufficient documentation

## 2019-07-11 DIAGNOSIS — H409 Unspecified glaucoma: Secondary | ICD-10-CM | POA: Diagnosis not present

## 2019-07-11 DIAGNOSIS — I10 Essential (primary) hypertension: Secondary | ICD-10-CM | POA: Diagnosis not present

## 2019-07-11 DIAGNOSIS — I2510 Atherosclerotic heart disease of native coronary artery without angina pectoris: Secondary | ICD-10-CM | POA: Diagnosis not present

## 2019-07-11 HISTORY — PX: COLONOSCOPY WITH PROPOFOL: SHX5780

## 2019-07-11 HISTORY — PX: ESOPHAGOGASTRODUODENOSCOPY (EGD) WITH PROPOFOL: SHX5813

## 2019-07-11 SURGERY — COLONOSCOPY WITH PROPOFOL
Anesthesia: General

## 2019-07-11 MED ORDER — SUCCINYLCHOLINE CHLORIDE 20 MG/ML IJ SOLN
INTRAMUSCULAR | Status: AC
  Filled 2019-07-11: qty 1

## 2019-07-11 MED ORDER — MIDAZOLAM HCL 2 MG/2ML IJ SOLN
INTRAMUSCULAR | Status: AC
  Filled 2019-07-11: qty 2

## 2019-07-11 MED ORDER — SODIUM CHLORIDE 0.9 % IV SOLN
INTRAVENOUS | Status: DC
  Administered 2019-07-11: 08:00:00 1000 mL via INTRAVENOUS

## 2019-07-11 MED ORDER — PROPOFOL 10 MG/ML IV BOLUS
INTRAVENOUS | Status: DC | PRN
  Administered 2019-07-11 (×2): 30 mg via INTRAVENOUS
  Administered 2019-07-11: 40 mg via INTRAVENOUS
  Administered 2019-07-11: 70 mg via INTRAVENOUS
  Administered 2019-07-11: 40 mg via INTRAVENOUS
  Administered 2019-07-11 (×2): 30 mg via INTRAVENOUS

## 2019-07-11 MED ORDER — EPHEDRINE SULFATE 50 MG/ML IJ SOLN
INTRAMUSCULAR | Status: AC
  Filled 2019-07-11: qty 1

## 2019-07-11 MED ORDER — MIDAZOLAM HCL 2 MG/2ML IJ SOLN
INTRAMUSCULAR | Status: DC | PRN
  Administered 2019-07-11 (×2): 2 mg via INTRAVENOUS

## 2019-07-11 MED ORDER — LIDOCAINE HCL (PF) 2 % IJ SOLN
INTRAMUSCULAR | Status: AC
  Filled 2019-07-11: qty 5

## 2019-07-11 MED ORDER — PHENYLEPHRINE HCL (PRESSORS) 10 MG/ML IV SOLN
INTRAVENOUS | Status: AC
  Filled 2019-07-11: qty 1

## 2019-07-11 MED ORDER — PROPOFOL 500 MG/50ML IV EMUL
INTRAVENOUS | Status: DC | PRN
  Administered 2019-07-11: 175 ug/kg/min via INTRAVENOUS

## 2019-07-11 MED ORDER — PROPOFOL 500 MG/50ML IV EMUL
INTRAVENOUS | Status: AC
  Filled 2019-07-11: qty 50

## 2019-07-11 NOTE — Anesthesia Preprocedure Evaluation (Signed)
Anesthesia Evaluation  Patient identified by MRN, date of birth, ID band Patient awake    Reviewed: Allergy & Precautions, NPO status , Patient's Chart, lab work & pertinent test results  History of Anesthesia Complications Negative for: history of anesthetic complications  Airway Mallampati: II  TM Distance: >3 FB Neck ROM: Full    Dental  (+) Partial Lower   Pulmonary asthma , sleep apnea , COPD,  COPD inhaler, Current SmokerPatient did not abstain from smoking.,    breath sounds clear to auscultation- rhonchi (-) wheezing      Cardiovascular hypertension, (-) angina+ CAD, + Past MI and + Cardiac Stents (2010)  (-) CABG  Rhythm:Regular Rate:Normal - Systolic murmurs and - Diastolic murmurs    Neuro/Psych neg Seizures PSYCHIATRIC DISORDERS Anxiety Depression Bipolar Disorder negative neurological ROS     GI/Hepatic Neg liver ROS, GERD  ,  Endo/Other  diabetes  Renal/GU negative Renal ROS     Musculoskeletal negative musculoskeletal ROS (+)   Abdominal (+) + obese,   Peds  Hematology negative hematology ROS (+)   Anesthesia Other Findings Past Medical History: No date: Anxiety No date: Asthma No date: Bipolar 1 disorder ([facility]) No date: Bipolar disorder ([place]) No date: CAD (coronary artery disease)     Comment:  s/p stent BMS OM Cx 04/12/2016: Cervical herniated disc No date: COPD (chronic obstructive pulmonary disease) ([facility]) No date: Depression No date: Diabetes mellitus without complication ([facility]) No date: Diverticulitis No date: GERD (gastroesophageal reflux disease) No date: Glaucoma No date: History of blood transfusion No date: Hyperlipidemia No date: Hypertension No date: OSA (obstructive sleep apnea)     Comment:  not using cpap  No date: Plantar fasciitis     Comment:  b/l feet s/p steroid shots w/o help and left surgery w/o              help  No date: UTI (urinary tract infection)   Reproductive/Obstetrics                             Anesthesia Physical Anesthesia Plan  ASA: III  Anesthesia Plan: General   Post-op Pain Management:    Induction: Intravenous  PONV Risk Score and Plan: 1 and Propofol infusion  Airway Management Planned: Natural Airway  Additional Equipment:   Intra-op Plan:   Post-operative Plan:   Informed Consent: I have reviewed the patients History and Physical, chart, labs and discussed the procedure including the risks, benefits and alternatives for the proposed anesthesia with the patient or authorized representative who has indicated his/her understanding and acceptance.     Dental advisory given  Plan Discussed with: CRNA and Anesthesiologist  Anesthesia Plan Comments:         Anesthesia Quick Evaluation

## 2019-07-11 NOTE — Anesthesia Post-op Follow-up Note (Signed)
Anesthesia QCDR form completed.        

## 2019-07-11 NOTE — Anesthesia Procedure Notes (Signed)
Date/Time: 07/11/2019 7:30 AM Performed by: Johnna Acosta, CRNA Pre-anesthesia Checklist: Patient identified, Emergency Drugs available, Suction available, Patient being monitored and Timeout performed Patient Re-evaluated:Patient Re-evaluated prior to induction Oxygen Delivery Method: Nasal cannula Preoxygenation: Pre-oxygenation with 100% oxygen Induction Type: IV induction

## 2019-07-11 NOTE — Op Note (Signed)
[facility] Gastroenterology Patient Name: Shawna Anderson Procedure Date: 07/11/2019 7:47 AM MRN: [id number] Account #: [id number] Date of Birth: 1966/04/14 Admit Type: Outpatient Age: 53 Room: [facility] ENDO ROOM 4 Gender: Female Note Status: Finalized Procedure:             Upper GI endoscopy Indications:           Screening for Barrett's esophagus Providers:             Jonathon Bellows MD, MD Referring MD:          Nino Glow Mclean-Scocuzza MD, MD (Referring MD) Medicines:             Monitored Anesthesia Care Complications:         No immediate complications. Procedure:             Pre-Anesthesia Assessment:                        - Prior to the procedure, a History and Physical was                         performed, and patient medications, allergies and                         sensitivities were reviewed. The patient's tolerance                         of previous anesthesia was reviewed.                        - The risks and benefits of the procedure and the                         sedation options and risks were discussed with the                         patient. All questions were answered and informed                         consent was obtained.                        - ASA Grade Assessment: II - A patient with mild                         systemic disease.                        After obtaining informed consent, the endoscope was                         passed under direct vision. Throughout the procedure,                         the patient's blood pressure, pulse, and oxygen                         saturations were monitored continuously. The Endoscope                         was introduced through  the mouth, and advanced to the                         third part of duodenum. The upper GI endoscopy was                         somewhat difficult due to the patient's agitation. The                         patient tolerated the procedure poorly due to the                        patient's agitation. Findings:      A non-obstructing Schatzki ring was found at the gastroesophageal       junction.      Localized moderate inflammation characterized by congestion (edema) and       erythema was found in the gastric antrum. Biopsies were taken with a       cold forceps for histology.      The examined duodenum was normal. Impression:            - Non-obstructing Schatzki ring.                        - Gastritis. Biopsied.                        - Normal examined duodenum. Recommendation:        - Await pathology results.                        - Perform a colonoscopy today. Procedure Code(s):     --- Professional ---                        [phone], Esophagogastroduodenoscopy, flexible,                         transoral; with biopsy, single or multiple Diagnosis Code(s):     --- Professional ---                        K22.2, Esophageal obstruction                        K29.70, Gastritis, unspecified, without bleeding                        Z13.810, Encounter for screening for upper                         gastrointestinal disorder CPT copyright 2019 American Medical Association. All rights reserved. The codes documented in this report are preliminary and upon coder review may  be revised to meet current compliance requirements. Jonathon Bellows, MD Jonathon Bellows MD, MD 07/11/2019 8:25:50 AM This report has been signed electronically. Number of Addenda: 0 Note Initiated On: 07/11/2019 7:47 AM Estimated Blood Loss:  Estimated blood loss: none.      [facility]

## 2019-07-11 NOTE — H&P (Signed)
Jonathon Bellows, MD [address], [place], [place], [place], [address] [address], [place], [place], [place], [address] Phone: [phone]  Fax: [phone]  Primary Care Physician:  McLean-Scocuzza, Nino Glow, MD   Pre-Procedure History & Physical: HPI:  Shawna Anderson is a 53 y.o. female is here for an endoscopy and colonoscopy    Past Medical History:  Diagnosis Date  . Anxiety   . Asthma   . Bipolar 1 disorder ([place])   . Bipolar disorder ([place])   . CAD (coronary artery disease)    s/p stent BMS OM Cx  . Cervical herniated disc 04/12/2016  . COPD (chronic obstructive pulmonary disease) ([place])   . Depression   . Diabetes mellitus without complication ([place])   . Diverticulitis   . GERD (gastroesophageal reflux disease)   . Glaucoma   . History of blood transfusion   . Hyperlipidemia   . Hypertension   . OSA (obstructive sleep apnea)    not using cpap   . Plantar fasciitis    b/l feet s/p steroid shots w/o help and left surgery w/o help   . UTI (urinary tract infection)     Past Surgical History:  Procedure Laterality Date  . ABDOMINAL HYSTERECTOMY    . ABDOMINAL SURGERY  1995   Bowel resection.  Marland Kitchen CARDIAC CATHETERIZATION N/A 04/14/2016   Procedure: Left Heart Cath and Coronary Angiography;  Surgeon: Burnell Blanks, MD;  Location: [facility];  Service: Cardiovascular;  Laterality: N/A;  . CARDIAC SURGERY    . CORONARY ANGIOPLASTY WITH STENT PLACEMENT  2010   Drug eluting stent  . OVARIAN CYST REMOVAL      Prior to Admission medications   Medication Sig Start Date End Date Taking? Authorizing Provider  ALPRAZolam Duanne Moron) 0.25 MG tablet Take 1 tablet (0.25 mg total) by mouth at bedtime as needed for anxiety or sleep. 07/05/19  Yes McLean-Scocuzza, Nino Glow, MD  amLODipine (NORVASC) 10 MG tablet Take 1 tablet (10 mg total) by mouth at bedtime. 06/29/19  Yes McLean-Scocuzza, Nino Glow, MD  aspirin EC 81 MG tablet Take 1 tablet (81 mg total) by  mouth daily. 11/04/18  Yes McLean-Scocuzza, Nino Glow, MD  clopidogrel (PLAVIX) 75 MG tablet Take 1 tablet (75 mg total) by mouth daily. 04/06/19  Yes McLean-Scocuzza, Nino Glow, MD  cyclobenzaprine (FLEXERIL) 5 MG tablet Take 1 tablet (5 mg total) by mouth at bedtime as needed for muscle spasms. 01/20/19  Yes McLean-Scocuzza, Nino Glow, MD  dicyclomine (BENTYL) 10 MG capsule Take 1 capsule (10 mg total) by mouth 4 (four) times daily -  before meals and at bedtime. 07/04/19  Yes Jonathon Bellows, MD  divalproex (DEPAKOTE) 500 MG DR tablet Take 1 tablet (500 mg total) by mouth 2 (two) times daily. 04/06/19  Yes McLean-Scocuzza, Nino Glow, MD  isosorbide mononitrate (IMDUR) 30 MG 24 hr tablet Take 1 tablet (30 mg total) by mouth daily. 07/05/19 10/03/19 Yes Visser, Jacquelyn D, PA-C  lidocaine (LIDODERM) 5 % Place 1 patch onto the skin daily. Remove & Discard patch within 12 hours or as directed by MD 11/16/18  Yes McLean-Scocuzza, Nino Glow, MD  olmesartan-hydrochlorothiazide (BENICAR HCT) 40-25 MG tablet Take 1 tablet by mouth daily. 06/28/19  Yes McLean-Scocuzza, Nino Glow, MD  ondansetron (ZOFRAN-ODT) 4 MG disintegrating tablet Take 1 tablet (4 mg total) by mouth every 8 (eight) hours as needed for nausea or vomiting. 04/06/19  Yes McLean-Scocuzza, Nino Glow, MD  pantoprazole (PROTONIX) 40 MG  tablet Take 1 tablet (40 mg total) by mouth 2 (two) times daily. In am before food 05/18/19 11/14/19 Yes Jonathon Bellows, MD  senna-docusate (SENOKOT-S) 8.6-50 MG tablet Take 1-2 tablets by mouth daily as needed for mild constipation. 02/18/19  Yes McLean-Scocuzza, Nino Glow, MD  traZODone (DESYREL) 150 MG tablet Take 1 tablet (150 mg total) by mouth at bedtime as needed for sleep. 04/06/19  Yes McLean-Scocuzza, Nino Glow, MD  valACYclovir (VALTREX) 1000 MG tablet Take 1 tablet (1,000 mg total) by mouth 3 (three) times daily. 11/16/18  Yes McLean-Scocuzza, Nino Glow, MD  albuterol (PROVENTIL) (2.5 MG/3ML) 0.083% nebulizer solution Take 3 mLs (2.5 mg total)  by nebulization every 6 (six) hours as needed for wheezing or shortness of breath. 04/06/19   McLean-Scocuzza, Nino Glow, MD  Albuterol Sulfate (PROAIR RESPICLICK) [id number] (90 Base) MCG/ACT AEPB Inhale 1 Inhaler into the lungs every 4 (four) hours as needed. 04/14/19   McLean-Scocuzza, Nino Glow, MD  atorvastatin (LIPITOR) 80 MG tablet Take 1 tablet (80 mg total) by mouth daily at 6 PM. 04/06/19 07/05/19  McLean-Scocuzza, Nino Glow, MD  ipratropium-albuterol (DUONEB) 0.5-2.5 (3) MG/3ML SOLN Take 3 mLs by nebulization every 6 (six) hours as needed. 12/29/17   McLean-Scocuzza, Nino Glow, MD  metroNIDAZOLE (FLAGYL) 500 MG tablet Take 1 tablet (500 mg total) by mouth 2 (two) times daily after a meal. Patient not taking: Reported on 07/11/2019 05/16/19   Lavonia Drafts, MD  nitroGLYCERIN (NITROSTAT) 0.4 MG SL tablet Place 1 tablet (0.4 mg total) under the tongue every 5 (five) minutes as needed for chest pain. X 2 call 911 after 3rd dose 11/04/18   McLean-Scocuzza, Nino Glow, MD    Allergies as of 05/18/2019 - Review Complete 05/18/2019  Allergen Reaction Noted  . Ativan [lorazepam]  11/14/2017  . Latex Other (See Comments) 02/12/2017  . Tape Other (See Comments) 02/12/2017  . Drixoral allergy sinus [dexbromphen-pse-apap er] Rash 01/12/2015    Family History  Problem Relation Age of Onset  . CAD Mother   . Depression Mother   . Heart disease Mother   . Hyperlipidemia Mother   . Hypertension Mother   . CAD Brother   . Depression Brother   . Diabetes Brother   . Heart disease Brother   . Hyperlipidemia Brother   . Heart disease Father   . Alcohol abuse Father     Social History   Socioeconomic History  . Marital status: Single    Spouse name: Not on file  . Number of children: Not on file  . Years of education: Not on file  . Highest education level: Not on file  Occupational History  . Not on file  Tobacco Use  . Smoking status: Current Every Day Smoker    Packs/day: 0.50    Years: 30.00    Pack  years: 15.00    Types: Cigarettes  . Smokeless tobacco: Never Used  . Tobacco comment: refused  Substance and Sexual Activity  . Alcohol use: Not Currently    Comment: once a year  . Drug use: Yes    Frequency: 7.0 times per week    Types: Marijuana  . Sexual activity: Not on file  Other Topics Concern  . Not on file  Social History Narrative   From [place] now living in [place]    1 son    No guns    Wears seat belt   Safe in relationship    Social Determinants of Health  Financial Resource Strain:   . Difficulty of Paying Living Expenses: Not on file  Food Insecurity:   . Worried About Charity fundraiser in the Last Year: Not on file  . Ran Out of Food in the Last Year: Not on file  Transportation Needs:   . Lack of Transportation (Medical): Not on file  . Lack of Transportation (Non-Medical): Not on file  Physical Activity:   . Days of Exercise per Week: Not on file  . Minutes of Exercise per Session: Not on file  Stress:   . Feeling of Stress : Not on file  Social Connections:   . Frequency of Communication with Friends and Family: Not on file  . Frequency of Social Gatherings with Friends and Family: Not on file  . Attends Religious Services: Not on file  . Active Member of Clubs or Organizations: Not on file  . Attends Archivist Meetings: Not on file  . Marital Status: Not on file  Intimate Partner Violence:   . Fear of Current or Ex-Partner: Not on file  . Emotionally Abused: Not on file  . Physically Abused: Not on file  . Sexually Abused: Not on file    Review of Systems: See HPI, otherwise negative ROS  Physical Exam: BP (!) 126/98   Pulse 91   Temp 98.2 F (36.8 C) (Temporal)   Resp 20   Ht 5\' 3"  (1.6 m)   Wt 86.4 kg   SpO2 99%   BMI 33.73 kg/m  General:   Alert,  pleasant and cooperative in NAD Head:  Normocephalic and atraumatic. Neck:  Supple; no masses or thyromegaly. Lungs:  Clear throughout to auscultation,  normal respiratory effort.    Heart:  +S1, +S2, Regular rate and rhythm, No edema. Abdomen:  Soft, nontender and nondistended. Normal bowel sounds, without guarding, and without rebound.   Neurologic:  Alert and  oriented x4;  grossly normal neurologically.  Impression/Plan: Shawna Anderson is here for an endoscopy and colonoscopy  to be performed for  evaluation of Barrettes esophagus and colon cancer screening average risk.     Risks, benefits, limitations, and alternatives regarding endoscopy have been reviewed with the patient.  Questions have been answered.  All parties agreeable.   Jonathon Bellows, MD  07/11/2019, 8:08 AM

## 2019-07-11 NOTE — Anesthesia Procedure Notes (Signed)
Date/Time: 07/11/2019 8:30 AM Performed by: Johnna Acosta, CRNA Pre-anesthesia Checklist: Patient identified, Emergency Drugs available, Suction available, Patient being monitored and Timeout performed Patient Re-evaluated:Patient Re-evaluated prior to induction Oxygen Delivery Method: Supernova nasal CPAP Preoxygenation: Pre-oxygenation with 100% oxygen Induction Type: IV induction

## 2019-07-11 NOTE — Transfer of Care (Signed)
Immediate Anesthesia Transfer of Care Note  Patient: Shawna Anderson  Procedure(s) Performed: COLONOSCOPY WITH PROPOFOL (N/A ) ESOPHAGOGASTRODUODENOSCOPY (EGD) WITH PROPOFOL (N/A )  Patient Location: PACU  Anesthesia Type:General  Level of Consciousness: awake and drowsy  Airway & Oxygen Therapy: Patient Spontanous Breathing  Post-op Assessment: Report given to RN and Post -op Vital signs reviewed and stable  Post vital signs: Reviewed and stable  Last Vitals:  Vitals Value Taken Time  BP    Temp    Pulse    Resp    SpO2      Last Pain:  Vitals:   07/11/19 0910  TempSrc:   PainSc: 0-No pain         Complications: No apparent anesthesia complications

## 2019-07-11 NOTE — Op Note (Signed)
[facility] Gastroenterology Patient Name: Shawna Anderson Procedure Date: 07/11/2019 7:46 AM MRN: [id number] Account #: [id number] Date of Birth: 1966/02/14 Admit Type: Outpatient Age: 53 Room: [facility] ENDO ROOM 4 Gender: Female Note Status: Finalized Procedure:             Colonoscopy Indications:           Screening for colorectal malignant neoplasm Providers:             Jonathon Bellows MD, MD Referring MD:          Nino Glow Mclean-Scocuzza MD, MD (Referring MD) Medicines:             Monitored Anesthesia Care Complications:         No immediate complications. Procedure:             Pre-Anesthesia Assessment:                        - Prior to the procedure, a History and Physical was                         performed, and patient medications, allergies and                         sensitivities were reviewed. The patient's tolerance                         of previous anesthesia was reviewed.                        - The risks and benefits of the procedure and the                         sedation options and risks were discussed with the                         patient. All questions were answered and informed                         consent was obtained.                        After obtaining informed consent, the colonoscope was                         passed under direct vision. Throughout the procedure,                         the patient's blood pressure, pulse, and oxygen                         saturations were monitored continuously. The                         Colonoscope was introduced through the anus with the                         intention of advancing to the cecum. The scope was  advanced to the transverse colon before the procedure                         was aborted. Medications were given. The colonoscopy                         was extremely difficult due to the patient's inability                         to tolerate  conscious sedation. procedure aborted as                         unable to seate patient Findings:      The perianal and digital rectal examinations were normal.      unable to complete procedure as patient cannot be sedated adequately and       was combative Impression:            - No specimens collected. Recommendation:        - Discharge patient to home (with escort).                        - Resume previous diet.                        - Continue present medications.                        - Repeat colonoscopy in 8 weeks for screening purposes.                        - To be performed under general anesthesia Procedure Code(s):     --- Professional ---                        [phone], 53, Colonoscopy, flexible; diagnostic,                         including collection of specimen(s) by brushing or                         washing, when performed (separate procedure) Diagnosis Code(s):     --- Professional ---                        Z12.11, Encounter for screening for malignant neoplasm                         of colon CPT copyright 2019 American Medical Association. All rights reserved. The codes documented in this report are preliminary and upon coder review may  be revised to meet current compliance requirements. Jonathon Bellows, MD Jonathon Bellows MD, MD 07/11/2019 9:00:24 AM This report has been signed electronically. Number of Addenda: 0 Note Initiated On: 07/11/2019 7:46 AM Total Procedure Duration: 0 hours 3 minutes 30 seconds  Estimated Blood Loss:  Estimated blood loss: none.      [facility]

## 2019-07-11 NOTE — Anesthesia Postprocedure Evaluation (Signed)
Anesthesia Post Note  Patient: CASEE WURSTER  Procedure(s) Performed: COLONOSCOPY WITH PROPOFOL (N/A ) ESOPHAGOGASTRODUODENOSCOPY (EGD) WITH PROPOFOL (N/A )  Patient location during evaluation: Endoscopy Anesthesia Type: General Level of consciousness: awake and alert and oriented Pain management: pain level controlled Vital Signs Assessment: post-procedure vital signs reviewed and stable Respiratory status: spontaneous breathing, nonlabored ventilation and respiratory function stable Cardiovascular status: blood pressure returned to baseline and stable Postop Assessment: no signs of nausea or vomiting Anesthetic complications: no     Last Vitals:  Vitals:   07/11/19 0930 07/11/19 0940  BP: (!) 98/52 (!) 103/52  Pulse: 79 75  Resp: (!) 26 (!) 25  Temp: 36.7 C 36.8 C  SpO2: 96% 94%    Last Pain:  Vitals:   07/11/19 0940  TempSrc: Tympanic  PainSc: 0-No pain                 Ruddy Swire

## 2019-07-12 ENCOUNTER — Encounter: Payer: Self-pay | Admitting: *Deleted

## 2019-07-12 ENCOUNTER — Ambulatory Visit: Payer: Medicare Other | Admitting: Internal Medicine

## 2019-07-12 LAB — SURGICAL PATHOLOGY

## 2019-07-17 ENCOUNTER — Encounter: Payer: Self-pay | Admitting: Gastroenterology

## 2019-07-18 ENCOUNTER — Encounter: Payer: Self-pay | Admitting: Radiology

## 2019-07-18 ENCOUNTER — Emergency Department: Payer: Medicare HMO

## 2019-07-18 ENCOUNTER — Other Ambulatory Visit: Payer: Self-pay

## 2019-07-18 ENCOUNTER — Emergency Department
Admission: EM | Admit: 2019-07-18 | Discharge: 2019-07-18 | Disposition: A | Discharge status: Home / Self Care | Payer: Medicare HMO | Attending: Emergency Medicine | Admitting: Emergency Medicine

## 2019-07-18 DIAGNOSIS — R1084 Generalized abdominal pain: Secondary | ICD-10-CM | POA: Diagnosis not present

## 2019-07-18 DIAGNOSIS — J449 Chronic obstructive pulmonary disease, unspecified: Secondary | ICD-10-CM | POA: Diagnosis not present

## 2019-07-18 DIAGNOSIS — E119 Type 2 diabetes mellitus without complications: Secondary | ICD-10-CM | POA: Insufficient documentation

## 2019-07-18 DIAGNOSIS — F17210 Nicotine dependence, cigarettes, uncomplicated: Secondary | ICD-10-CM | POA: Insufficient documentation

## 2019-07-18 DIAGNOSIS — R1110 Vomiting, unspecified: Secondary | ICD-10-CM | POA: Diagnosis not present

## 2019-07-18 DIAGNOSIS — Z7982 Long term (current) use of aspirin: Secondary | ICD-10-CM | POA: Insufficient documentation

## 2019-07-18 DIAGNOSIS — Z79899 Other long term (current) drug therapy: Secondary | ICD-10-CM | POA: Diagnosis not present

## 2019-07-18 DIAGNOSIS — R112 Nausea with vomiting, unspecified: Secondary | ICD-10-CM

## 2019-07-18 DIAGNOSIS — Z91040 Latex allergy status: Secondary | ICD-10-CM | POA: Insufficient documentation

## 2019-07-18 DIAGNOSIS — I10 Essential (primary) hypertension: Secondary | ICD-10-CM | POA: Diagnosis not present

## 2019-07-18 DIAGNOSIS — J45909 Unspecified asthma, uncomplicated: Secondary | ICD-10-CM | POA: Diagnosis not present

## 2019-07-18 LAB — CBC
HCT: 40.8 % (ref 36.0–46.0)
Hemoglobin: 13.7 g/dL (ref 12.0–15.0)
MCH: 29.4 pg (ref 26.0–34.0)
MCHC: 33.6 g/dL (ref 30.0–36.0)
MCV: 87.6 fL (ref 80.0–100.0)
Platelets: 550 10*3/uL — ABNORMAL HIGH (ref 150–400)
RBC: 4.66 MIL/uL (ref 3.87–5.11)
RDW: 13.8 % (ref 11.5–15.5)
WBC: 17 10*3/uL — ABNORMAL HIGH (ref 4.0–10.5)
nRBC: 0 % (ref 0.0–0.2)

## 2019-07-18 LAB — COMPREHENSIVE METABOLIC PANEL
ALT: 22 U/L (ref 0–44)
AST: 24 U/L (ref 15–41)
Albumin: 4.5 g/dL (ref 3.5–5.0)
Alkaline Phosphatase: 89 U/L (ref 38–126)
Anion gap: 15 (ref 5–15)
BUN: 18 mg/dL (ref 6–20)
CO2: 22 mmol/L (ref 22–32)
Calcium: 9.5 mg/dL (ref 8.9–10.3)
Chloride: 102 mmol/L (ref 98–111)
Creatinine, Ser: 0.92 mg/dL (ref 0.44–1.00)
GFR calc Af Amer: >60 mL/min (ref >60)
GFR calc non Af Amer: >60 mL/min (ref >60)
Glucose, Bld: 150 mg/dL — ABNORMAL HIGH (ref 70–99)
Potassium: 3.8 mmol/L (ref 3.5–5.1)
Sodium: 139 mmol/L (ref 135–145)
Total Bilirubin: 0.7 mg/dL (ref 0.3–1.2)
Total Protein: 7.8 g/dL (ref 6.5–8.1)

## 2019-07-18 LAB — LIPASE, BLOOD: Lipase: 27 U/L (ref 11–51)

## 2019-07-18 MED ORDER — ONDANSETRON HCL 4 MG/2ML IJ SOLN
4.0000 mg | Freq: Once | INTRAMUSCULAR | Status: AC
  Administered 2019-07-18: 4 mg via INTRAVENOUS
  Filled 2019-07-18: qty 2

## 2019-07-18 MED ORDER — ONDANSETRON HCL 4 MG/2ML IJ SOLN
INTRAMUSCULAR | Status: AC
  Administered 2019-07-18: 4 mg via INTRAVENOUS
  Filled 2019-07-18: qty 2

## 2019-07-18 MED ORDER — ONDANSETRON 4 MG PO TBDP: 4.0000 mg | ORAL_TABLET | Freq: Three times a day (TID) | ORAL | 0 refills | Status: DC | PRN

## 2019-07-18 MED ORDER — ONDANSETRON HCL 4 MG/2ML IJ SOLN: 4.0000 mg | Freq: Once | INTRAMUSCULAR | Status: AC | PRN

## 2019-07-18 MED ORDER — SODIUM CHLORIDE 0.9 % IV BOLUS
1000.0000 mL | Freq: Once | INTRAVENOUS | Status: AC
  Administered 2019-07-18: 1000 mL via INTRAVENOUS

## 2019-07-18 MED ORDER — MORPHINE SULFATE (PF) 4 MG/ML IV SOLN
INTRAVENOUS | Status: AC
  Filled 2019-07-18: qty 1

## 2019-07-18 MED ORDER — SODIUM CHLORIDE 0.9% FLUSH: 3.0000 mL | Freq: Once | INTRAVENOUS | Status: DC

## 2019-07-18 MED ORDER — IOHEXOL 300 MG/ML  SOLN
100.0000 mL | Freq: Once | INTRAMUSCULAR | Status: AC | PRN
  Administered 2019-07-18: 100 mL via INTRAVENOUS

## 2019-07-18 MED ORDER — MORPHINE SULFATE (PF) 4 MG/ML IV SOLN
4.0000 mg | Freq: Once | INTRAVENOUS | Status: AC
  Administered 2019-07-18: 4 mg via INTRAVENOUS

## 2019-07-18 MED ORDER — PANTOPRAZOLE SODIUM 40 MG IV SOLR
40.0000 mg | Freq: Once | INTRAVENOUS | Status: AC
  Administered 2019-07-18: 40 mg via INTRAVENOUS
  Filled 2019-07-18: qty 40

## 2019-07-18 MED ORDER — HYDROMORPHONE HCL 1 MG/ML IJ SOLN
1.0000 mg | Freq: Once | INTRAMUSCULAR | Status: AC
  Administered 2019-07-18: 1 mg via INTRAVENOUS
  Filled 2019-07-18: qty 1

## 2019-07-18 NOTE — ED Notes (Signed)
Resumed care from val rn.  Pt alert  Iv fluids infusing.  meds given   States feeling better.  Pt in hallway bed.

## 2019-07-18 NOTE — ED Notes (Signed)
Pt having episode of pain and yelling out. md aware.  meds given.  Pt in hallway bed.

## 2019-07-18 NOTE — ED Triage Notes (Signed)
Pt arrives from home via pov, pt states that she had an endoscopy a few day ago and has been vomiting ever since, pt states they were unable to do the  Colonoscopy because she was so agitated and they would have to put her to sleep. Pt is non stop wretching and violently vomiting throughout triage, difficult to get pt triaged and questions answered due to the wretching. Pt is c/o mid abd pain and in between bouts of vomiting yells at this RN "just shoot me up or do something!" pt denies calling gi dr post endoscopy

## 2019-07-18 NOTE — ED Notes (Signed)
Lab called to inquire about labs- they could not accept them bc there was missing DOB, phelobotomist was notified to draw labs.

## 2019-07-18 NOTE — ED Provider Notes (Signed)
[facility] Emergency Department Provider Note ____________________________________________   First MD Initiated Contact with Patient 07/18/19 1433     (approximate)  I have reviewed the triage vital signs and the nursing notes.   HISTORY  Chief Complaint Abdominal Pain and Emesis  Level 5 caveat: History of present illness limited due to poor historian  HPI Shawna Anderson is a 54 y.o. female with PMH as noted below and status post upper and lower endoscopy on 12/28 who presents with diffuse abdominal pain which she states has been present since the procedure, as well as persistent nausea and vomiting over the last several days with possible traces of blood.  The patient states that she has had pain similar to this previously and yells "check the computer" when I ask her more specific questions about her history.  She denies any diarrhea or change in bowel movements.   Past Medical History:  Diagnosis Date  . Anxiety   . Asthma   . Bipolar 1 disorder ([place])   . Bipolar disorder ([place])   . CAD (coronary artery disease)    s/p stent BMS OM Cx  . Cervical herniated disc 04/12/2016  . COPD (chronic obstructive pulmonary disease) ([place])   . Depression   . Diabetes mellitus without complication ([place])   . Diverticulitis   . GERD (gastroesophageal reflux disease)   . Glaucoma   . History of blood transfusion   . Hyperlipidemia   . Hypertension   . OSA (obstructive sleep apnea)    not using cpap   . Plantar fasciitis    b/l feet s/p steroid shots w/o help and left surgery w/o help   . UTI (urinary tract infection)     Patient Active Problem List   Diagnosis Date Noted  . Female pelvic pain 05/26/2019  . Colitis 05/26/2019  . HLD (hyperlipidemia) 12/27/2018  . Vitamin D deficiency 12/24/2018  . Bipolar disorder ([place]) 12/30/2017  . OSA (obstructive sleep apnea) 12/30/2017  . Constipation 12/30/2017  . Coronary artery disease of native artery of  native heart with stable angina pectoris ([place]) 12/30/2017  . Gastroesophageal reflux disease 12/30/2017  . Asthma 12/29/2017  . COPD (chronic obstructive pulmonary disease) ([place]) 12/29/2017  . Anxiety and depression 12/29/2017  . Insomnia 12/29/2017  . Chronic back pain 12/29/2017  . Skin lesion of back 12/29/2017  . CAD S/P CFX PCI 2010 04/15/2016  . Essential hypertension 04/15/2016  . Dyslipidemia 04/15/2016  . Noncompliance with medication regimen 04/15/2016  . Chest pain with moderate risk of acute coronary syndrome 04/12/2016  . Abdominal pain 01/12/2015  . Vomiting 01/12/2015  . Nausea and vomiting 01/12/2015    Past Surgical History:  Procedure Laterality Date  . ABDOMINAL HYSTERECTOMY    . ABDOMINAL SURGERY  1995   Bowel resection.  Marland Kitchen CARDIAC CATHETERIZATION N/A 04/14/2016   Procedure: Left Heart Cath and Coronary Angiography;  Surgeon: Burnell Blanks, MD;  Location: [place] [facility];  Service: Cardiovascular;  Laterality: N/A;  . CARDIAC SURGERY    . COLONOSCOPY WITH PROPOFOL N/A 07/11/2019   Procedure: COLONOSCOPY WITH PROPOFOL;  Surgeon: Jonathon Bellows, MD;  Location[facility] ENDOSCOPY;  Service: Gastroenterology;  Laterality: N/A;  . CORONARY ANGIOPLASTY WITH STENT PLACEMENT  2010   Drug eluting stent  . ESOPHAGOGASTRODUODENOSCOPY (EGD) WITH PROPOFOL N/A 07/11/2019   Procedure: ESOPHAGOGASTRODUODENOSCOPY (EGD) WITH PROPOFOL;  Surgeon: Jonathon Bellows, MD;  Location: [facility] ENDOSCOPY;  Service: Gastroenterology;  Laterality: N/A;  . OVARIAN CYST REMOVAL  Prior to Admission medications   Medication Sig Start Date End Date Taking? Authorizing Provider  albuterol (PROVENTIL) (2.5 MG/3ML) 0.083% nebulizer solution Take 3 mLs (2.5 mg total) by nebulization every 6 (six) hours as needed for wheezing or shortness of breath. 04/06/19   McLean-Scocuzza, Nino Glow, MD  Albuterol Sulfate (PROAIR RESPICLICK) 123XX123 (90 Base) MCG/ACT AEPB Inhale 1 Inhaler into the lungs every 4 (four)  hours as needed. 04/14/19   McLean-Scocuzza, Nino Glow, MD  ALPRAZolam Duanne Moron) 0.25 MG tablet Take 1 tablet (0.25 mg total) by mouth at bedtime as needed for anxiety or sleep. 07/05/19   McLean-Scocuzza, Nino Glow, MD  amLODipine (NORVASC) 10 MG tablet Take 1 tablet (10 mg total) by mouth at bedtime. 06/29/19   McLean-Scocuzza, Nino Glow, MD  aspirin EC 81 MG tablet Take 1 tablet (81 mg total) by mouth daily. 11/04/18   McLean-Scocuzza, Nino Glow, MD  atorvastatin (LIPITOR) 80 MG tablet Take 1 tablet (80 mg total) by mouth daily at 6 PM. 04/06/19 07/05/19  McLean-Scocuzza, Nino Glow, MD  clopidogrel (PLAVIX) 75 MG tablet Take 1 tablet (75 mg total) by mouth daily. 04/06/19   McLean-Scocuzza, Nino Glow, MD  cyclobenzaprine (FLEXERIL) 5 MG tablet Take 1 tablet (5 mg total) by mouth at bedtime as needed for muscle spasms. 01/20/19   McLean-Scocuzza, Nino Glow, MD  dicyclomine (BENTYL) 10 MG capsule Take 1 capsule (10 mg total) by mouth 4 (four) times daily -  before meals and at bedtime. 07/04/19   Jonathon Bellows, MD  divalproex (DEPAKOTE) 500 MG DR tablet Take 1 tablet (500 mg total) by mouth 2 (two) times daily. 04/06/19   McLean-Scocuzza, Nino Glow, MD  ipratropium-albuterol (DUONEB) 0.5-2.5 (3) MG/3ML SOLN Take 3 mLs by nebulization every 6 (six) hours as needed. 12/29/17   McLean-Scocuzza, Nino Glow, MD  isosorbide mononitrate (IMDUR) 30 MG 24 hr tablet Take 1 tablet (30 mg total) by mouth daily. 07/05/19 10/03/19  Marrianne Mood D, PA-C  lidocaine (LIDODERM) 5 % Place 1 patch onto the skin daily. Remove & Discard patch within 12 hours or as directed by MD 11/16/18   McLean-Scocuzza, Nino Glow, MD  metroNIDAZOLE (FLAGYL) 500 MG tablet Take 1 tablet (500 mg total) by mouth 2 (two) times daily after a meal. Patient not taking: Reported on 07/11/2019 05/16/19   Lavonia Drafts, MD  nitroGLYCERIN (NITROSTAT) 0.4 MG SL tablet Place 1 tablet (0.4 mg total) under the tongue every 5 (five) minutes as needed for chest pain. X 2 call 911  after 3rd dose 11/04/18   McLean-Scocuzza, Nino Glow, MD  olmesartan-hydrochlorothiazide (BENICAR HCT) 40-25 MG tablet Take 1 tablet by mouth daily. 06/28/19   McLean-Scocuzza, Nino Glow, MD  ondansetron (ZOFRAN-ODT) 4 MG disintegrating tablet Take 1 tablet (4 mg total) by mouth every 8 (eight) hours as needed for nausea or vomiting. 04/06/19   McLean-Scocuzza, Nino Glow, MD  pantoprazole (PROTONIX) 40 MG tablet Take 1 tablet (40 mg total) by mouth 2 (two) times daily. In am before food 05/18/19 11/14/19  Jonathon Bellows, MD  senna-docusate (SENOKOT-S) 8.6-50 MG tablet Take 1-2 tablets by mouth daily as needed for mild constipation. 02/18/19   McLean-Scocuzza, Nino Glow, MD  traZODone (DESYREL) 150 MG tablet Take 1 tablet (150 mg total) by mouth at bedtime as needed for sleep. 04/06/19   McLean-Scocuzza, Nino Glow, MD  valACYclovir (VALTREX) 1000 MG tablet Take 1 tablet (1,000 mg total) by mouth 3 (three) times daily. 11/16/18   McLean-Scocuzza, Nino Glow, MD    Allergies  Ativan [lorazepam], Latex, Tape, and Drixoral allergy sinus [dexbromphen-pse-apap er]  Family History  Problem Relation Age of Onset  . CAD Mother   . Depression Mother   . Heart disease Mother   . Hyperlipidemia Mother   . Hypertension Mother   . CAD Brother   . Depression Brother   . Diabetes Brother   . Heart disease Brother   . Hyperlipidemia Brother   . Heart disease Father   . Alcohol abuse Father     Social History Social History   Tobacco Use  . Smoking status: Current Every Day Smoker    Packs/day: 0.50    Years: 30.00    Pack years: 15.00    Types: Cigarettes  . Smokeless tobacco: Never Used  . Tobacco comment: refused  Substance Use Topics  . Alcohol use: Not Currently    Comment: once a year  . Drug use: Yes    Frequency: 7.0 times per week    Types: Marijuana    Review of Systems Level 5 caveat: Unable to obtain review of systems due to poor  historian    ____________________________________________   PHYSICAL EXAM:  VITAL SIGNS: ED Triage Vitals  Enc Vitals Group     BP 07/18/19 1210 106/76     Pulse Rate 07/18/19 1210 (!) 120     Resp 07/18/19 1210 18     Temp 07/18/19 1210 98.8 F (37.1 C)     Temp Source 07/18/19 1210 Oral     SpO2 07/18/19 1210 100 %     Weight 07/18/19 1226 190 lb (86.2 kg)     Height 07/18/19 1226 5\' 3"  (1.6 m)     Head Circumference --      Peak Flow --      Pain Score --      Pain Loc --      Pain Edu? --      Excl. in [place]? --     Constitutional: Alert and oriented.  Uncomfortable appearing, intermittently retching, yelling, and tearful.   Eyes: Conjunctivae are normal.  Head: Atraumatic. Nose: No congestion/rhinnorhea. Mouth/Throat: Mucous membranes are moist.   Neck: Normal range of motion.  Cardiovascular: Normal rate, regular rhythm.  Good peripheral circulation. Respiratory: Normal respiratory effort.  No retractions.  Gastrointestinal: Soft with mild diffuse tenderness although limited due to the patient's level of discomfort and inability to tolerate exam.  Genitourinary: No flank tenderness. Musculoskeletal:  Extremities warm and well perfused.  Neurologic:  Normal speech and language. No gross focal neurologic deficits are appreciated.  Skin:  Skin is warm and dry. No rash noted. Psychiatric: Mood and affect are normal. Speech and behavior are normal.  ____________________________________________   LABS (all labs ordered are listed, but only abnormal results are displayed)  Labs Reviewed  COMPREHENSIVE METABOLIC PANEL - Abnormal; Notable for the following components:      Result Value   Glucose, Bld 150 (*)    All other components within normal limits  CBC - Abnormal; Notable for the following components:   WBC 17.0 (*)    Platelets 550 (*)    All other components within normal limits  LIPASE, BLOOD  URINALYSIS, COMPLETE (UACMP) WITH MICROSCOPIC  POC URINE PREG,  ED   ____________________________________________  EKG   ____________________________________________  RADIOLOGY  CT abdomen: No acute abnormality  ____________________________________________   PROCEDURES  Procedure(s) performed: No  Procedures  Critical Care performed: No ____________________________________________   INITIAL IMPRESSION / ASSESSMENT AND PLAN / ED COURSE  Pertinent  labs & imaging results that were available during my care of the patient were reviewed by me and considered in my medical decision making (see chart for details).  54 year old female with PMH as noted above presents with persistent diffuse abdominal pain and worsening nausea and vomiting over the last several days since having an upper and lower endoscopy on 12/28.  I reviewed the past medical records in epic and confirmed that the patient had upper and lower endoscopy on 12/28.  The upper endoscopy was relatively unremarkable but limited due to the patient's agitation, and the colonoscopy was aborted due to the patient's agitation and inability to be sedated appropriately.  The patient has several prior ED visits for abdominal pain, with most recent CT in November showing findings consistent with colitis.  The patient is status post partial distal colectomy.  On exam today, the patient is very uncomfortable appearing, agitated, intermittently retching and yelling, and saying things like "just knock me out."  However, I then observed her walking to the bathroom without assistance and appearing relatively comfortable.  Abdominal exam was difficult due to the patient's level of discomfort.  She received Zofran in triage with no relief.  Differential includes recurrent or persistent colitis, exacerbation of chronic abdominal pain, gastritis, hepatobiliary etiology, or possible postprocedural complication such as a perforation.  We will obtain lab work-up, CT abdomen, give Dilaudid and Zofran for  symptomatic treatment, fluids, and reassess.  ----------------------------------------- 3:59 PM on 07/18/2019 -----------------------------------------  The patient reports improved but not resolved abdominal pain.  She appears more comfortable.  CT shows no acute findings.  Lab work-up is remarkable only for elevated WBC count which is nonspecific.  Given the severity of the patient's symptoms, we will observe her for a few hours and reassess.  I signed her out to the oncoming physician Dr. Charna Archer.  ____________________________________________   FINAL CLINICAL IMPRESSION(S) / ED DIAGNOSES  Final diagnoses:  Generalized abdominal pain      NEW MEDICATIONS STARTED DURING THIS VISIT:  New Prescriptions   No medications on file     Note:  This document was prepared using Dragon voice recognition software and may include unintentional dictation errors.    Arta Silence, MD 07/18/19 1600

## 2019-07-18 NOTE — ED Provider Notes (Signed)
-----------------------------------------   3:03 PM on 07/18/2019 -----------------------------------------  Blood pressure (!) 205/118, pulse (!) 130, temperature 98.8 F (37.1 C), temperature source Oral, resp. rate (!) 26, height 5\' 3"  (1.6 m), weight 86.2 kg, SpO2 98 %.  Assuming care from Dr. Cherylann Banas.  In short, Shawna Anderson is a 54 y.o. female with a chief complaint of Abdominal Pain and Emesis .  Refer to the original H&P for additional details.  The current plan of care is to follow-up CT imaging for abdominal pain and vomiting, patient with recent endoscopy and colonoscopy that showed gastritis. She did have weakly guaiac positive emesis here.  CT scan negative for acute process.  Doubt UTI clinically as all of patient's pain is in her upper abdomen and she denies any urinary symptoms.  Patient did have recurrence of upper abdominal "spasm", improved with dose of morphine and Zofran.  Patient reports that she smokes marijuana twice daily and I have advised her to quit doing so as this may be contributing to her symptoms, however she states she will not consider this.  She was also advised to quit smoking cigarettes and continue Protonix.  Will prescribe additional Zofran as needed and counseled her to follow-up with her GI provider.  Counseled to return to the ED for new or worsening symptoms, patient agrees with plan.    Blake Divine, MD 07/18/19 [phone]

## 2019-07-20 ENCOUNTER — Other Ambulatory Visit: Payer: Self-pay

## 2019-07-20 ENCOUNTER — Encounter: Payer: Self-pay | Admitting: Gastroenterology

## 2019-07-20 ENCOUNTER — Ambulatory Visit: Payer: Medicare Other | Admitting: Physician Assistant

## 2019-07-20 ENCOUNTER — Ambulatory Visit (INDEPENDENT_AMBULATORY_CARE_PROVIDER_SITE_OTHER): Payer: Medicare HMO | Admitting: Gastroenterology

## 2019-07-20 VITALS — BP 125/62 | HR 77 | Temp 98.7°F | Ht 63.0 in | Wt 187.6 lb

## 2019-07-20 DIAGNOSIS — R933 Abnormal findings on diagnostic imaging of other parts of digestive tract: Secondary | ICD-10-CM

## 2019-07-20 DIAGNOSIS — R140 Abdominal distension (gaseous): Secondary | ICD-10-CM

## 2019-07-20 MED ORDER — ISOSORBIDE MONONITRATE ER 30 MG PO TB24: 30.0000 mg | ORAL_TABLET | Freq: Every day | ORAL | 0 refills | Status: DC

## 2019-07-20 MED ORDER — HYOSCYAMINE SULFATE 0.125 MG PO TABS: 0.1250 mg | ORAL_TABLET | ORAL | 5 refills | Status: DC

## 2019-07-20 NOTE — Progress Notes (Signed)
Jonathon Bellows MD, MRCP(U.K) [address]  [place]  [place], [place] [address]  Main: [phone]  Fax: [phone]   Primary Care Physician: McLean-Scocuzza, Nino Glow, MD  Primary Gastroenterologist:  Dr. Jonathon Bellows   Follow-up to ER visit  HPI: Shawna Anderson is a 54 y.o. female    Summary of history :  Initially seen and referred in November 2020 for GERD.  History of acid reflux for over 10 years.  Gained  weight recently.  At initial visit she was on Protonix once a day which was not helping her.  Symptoms of heartburn regurgitation.She says that she has had rupture of her intestines at the age of 69 possibly due to diverticulitis.  Initial visit she had pain in the left lower quadrant which has been ongoing since visit to ER in November 2020.  At that time she was given a course of antibiotics as there is inflammation seen on the CT scan at her distal colonic anastomosis.  History of partial colectomy.  Hysterectomy showing an indeterminant left adnexal soft tissue focus adjacent to perianastomotic thick-walled blind ending distal colonic limb.  Left ovarian mass cannot be excluded entirely.  Given antibiotics but had not completed the course.05/16/2019 hemoglobin 12.7 g, creatinine 1.15, lipase normal  Interval history   05/18/2019-07/20/2019  At her last visit I increased her Protonix to twice a day.  05/20/2019: Ultrasound pelvis: Collection with a shadowing calcification seen in the left adnexa adjacent to the pelvic sidewall correspondents well to a collection seen on previous CT.  Possible postsurgical or lesion of ovarian adnexal origin.  Recommended to follow-up CT or MRI in 1 to 2 months.  07/11/2019: EGD: Nonobstructive Schatzki's ring seen inflammation seen at the antrum of the stomach, biopsies showed no significant abnormalities.  Colonoscopy: Patient was unable to be sedated and hence procedure could not be performed she has had issues with sedation in the  past.  07/18/2019: Came into the emergency room with abdominal pain.  CT scan of the abdomen was repeated showed no significant abnormality.  Hemoglobin 13.7 g.  White cell count of 17.  CMP normal except an elevated glucose of 150.  Lipase normal.  She says she has had issues with abdominal distention with her overall abdomen feeling bloated in nature.  She smokes marijuana.  No issues with the bowel movements.  No significant improvement after bowel movement.  Has not noticed if it improves after passage of gas. Current Outpatient Medications  Medication Sig Dispense Refill  . albuterol (PROVENTIL) (2.5 MG/3ML) 0.083% nebulizer solution Take 3 mLs (2.5 mg total) by nebulization every 6 (six) hours as needed for wheezing or shortness of breath. 360 mL 12  . Albuterol Sulfate (PROAIR RESPICLICK) 123XX123 (90 Base) MCG/ACT AEPB Inhale 1 Inhaler into the lungs every 4 (four) hours as needed. 3 each 4  . ALPRAZolam (XANAX) 0.25 MG tablet Take 1 tablet (0.25 mg total) by mouth at bedtime as needed for anxiety or sleep. 30 tablet 2  . amLODipine (NORVASC) 10 MG tablet Take 1 tablet (10 mg total) by mouth at bedtime. 90 tablet 3  . aspirin EC 81 MG tablet Take 1 tablet (81 mg total) by mouth daily. 90 tablet 3  . atorvastatin (LIPITOR) 80 MG tablet Take 1 tablet (80 mg total) by mouth daily at 6 PM. 90 tablet 3  . clopidogrel (PLAVIX) 75 MG tablet Take 1 tablet (75 mg total) by mouth daily. 90 tablet 3  . cyclobenzaprine (FLEXERIL) 5  MG tablet Take 1 tablet (5 mg total) by mouth at bedtime as needed for muscle spasms. 30 tablet 0  . dicyclomine (BENTYL) 10 MG capsule Take 1 capsule (10 mg total) by mouth 4 (four) times daily -  before meals and at bedtime. 120 capsule 2  . divalproex (DEPAKOTE) 500 MG DR tablet Take 1 tablet (500 mg total) by mouth 2 (two) times daily. 180 tablet 1  . ipratropium-albuterol (DUONEB) 0.5-2.5 (3) MG/3ML SOLN Take 3 mLs by nebulization every 6 (six) hours as needed. 360 mL 11  .  isosorbide mononitrate (IMDUR) 30 MG 24 hr tablet Take 1 tablet (30 mg total) by mouth daily. 90 tablet 0  . lidocaine (LIDODERM) 5 % Place 1 patch onto the skin daily. Remove & Discard patch within 12 hours or as directed by MD 30 patch 0  . metroNIDAZOLE (FLAGYL) 500 MG tablet Take 1 tablet (500 mg total) by mouth 2 (two) times daily after a meal. (Patient not taking: Reported on 07/11/2019) 14 tablet 0  . nitroGLYCERIN (NITROSTAT) 0.4 MG SL tablet Place 1 tablet (0.4 mg total) under the tongue every 5 (five) minutes as needed for chest pain. X 2 call 911 after 3rd dose 30 tablet 5  . olmesartan-hydrochlorothiazide (BENICAR HCT) 40-25 MG tablet Take 1 tablet by mouth daily. 90 tablet 3  . ondansetron (ZOFRAN ODT) 4 MG disintegrating tablet Take 1 tablet (4 mg total) by mouth every 8 (eight) hours as needed for nausea or vomiting. 12 tablet 0  . pantoprazole (PROTONIX) 40 MG tablet Take 1 tablet (40 mg total) by mouth 2 (two) times daily. In am before food 60 tablet 5  . senna-docusate (SENOKOT-S) 8.6-50 MG tablet Take 1-2 tablets by mouth daily as needed for mild constipation. 90 tablet 1  . traZODone (DESYREL) 150 MG tablet Take 1 tablet (150 mg total) by mouth at bedtime as needed for sleep. 90 tablet 3  . valACYclovir (VALTREX) 1000 MG tablet Take 1 tablet (1,000 mg total) by mouth 3 (three) times daily. 21 tablet 0   No current facility-administered medications for this visit.    Allergies as of 07/20/2019 - Review Complete 07/18/2019  Allergen Reaction Noted  . Ativan [lorazepam]  11/14/2017  . Latex Other (See Comments) 02/12/2017  . Tape Other (See Comments) 02/12/2017  . Drixoral allergy sinus [dexbromphen-pse-apap er] Rash 01/12/2015    ROS:  General: Negative for anorexia, weight loss, fever, chills, fatigue, weakness. ENT: Negative for hoarseness, difficulty swallowing , nasal congestion. CV: Negative for chest pain, angina, palpitations, dyspnea on exertion, peripheral edema.    Respiratory: Negative for dyspnea at rest, dyspnea on exertion, cough, sputum, wheezing.  GI: See history of present illness. GU:  Negative for dysuria, hematuria, urinary incontinence, urinary frequency, nocturnal urination.  Endo: Negative for unusual weight change.    Physical Examination:   There were no vitals taken for this visit.  General: Well-nourished, well-developed in no acute distress.  Eyes: No icterus. Conjunctivae pink. Lungs: Clear to auscultation bilaterally. Non-labored. Heart: Regular rate and rhythm, no murmurs rubs or gallops.  Abdomen: Midline central abdominal scar bowel sounds are normal, nontender, nondistended, no hepatosplenomegaly or masses, no abdominal bruits or hernia , no rebound or guarding.   Extremities: No lower extremity edema. No clubbing or deformities. Neuro: Alert and oriented x 3.  Grossly intact. Skin: Warm and dry, no jaundice.   Psych: Alert and cooperative, normal mood and affect.   Imaging Studies: CT ABDOMEN PELVIS W CONTRAST  Result  Date: 07/18/2019 CLINICAL DATA:  Abdominal pain and vomiting. Symptoms since endoscopy several days ago. EXAM: CT ABDOMEN AND PELVIS WITH CONTRAST TECHNIQUE: Multidetector CT imaging of the abdomen and pelvis was performed using the standard protocol following bolus administration of intravenous contrast. CONTRAST:  178mL OMNIPAQUE IOHEXOL 300 MG/ML  SOLN COMPARISON:  05/16/2019 FINDINGS: Lower chest: Mild scarring at the lung bases.  No active process. Hepatobiliary: Normal Pancreas: Normal Spleen: Normal Adrenals/Urinary Tract: Adrenal glands are normal. Kidneys are normal. Bladder is normal. Stomach/Bowel: No abnormality seen affecting the distal esophagus, stomach or small intestine. Minimal diverticulosis of the left colon. Previous distal colectomy. Anastomotic site appears unremarkable. Blind ending limb appears unremarkable. No inflammatory change suggested today. Vascular/Lymphatic: Aortic atherosclerosis.  No aneurysm. IVC is normal. No adenopathy. Reproductive: Previous hysterectomy.  No pelvic mass. Other: No free fluid or air. Musculoskeletal: Normal IMPRESSION: No acute or significant finding. No abnormality seen to explain vomiting or abdominal pain. Previous segmental colectomy in the left colon. The anastomosis appears unremarkable today. Small blind-ending segment does not show any inflammatory change presently. Electronically Signed   By: Nelson Chimes M.D.   On: 07/18/2019 15:35   DG Chest Portable 1 View  Result Date: 07/18/2019 CLINICAL DATA:  Vomiting EXAM: PORTABLE CHEST 1 VIEW COMPARISON:  Nov 28, 2018 FINDINGS: Lungs are clear. Heart size and pulmonary vascularity are normal. No adenopathy. No bone lesions. IMPRESSION: No edema or consolidation.  No evident adenopathy. Electronically Signed   By: Lowella Grip III M.D.   On: 07/18/2019 15:45    Assessment and Plan:   DEALVA MATLICK is a 54 y.o. y/o female here to see me today as a follow-up for her ER visit.  We attempted to perform a colonoscopy recently but she could not be sedated hence we will proceed with a colonoscopy under general anesthesia as recommended by anesthesia during her last procedure.  She presently has issues with bloating and abdominal distention which could be due to adhesions from her prior abdominal surgery.  She has had this issues for many years she states.  I have also suggested she stop smoking marijuana as it can also lead to aerophagia and trapping of gas.  I have advised her to stop all artificial sugars as it can also lead to accumulation of gas.  Trial of activated charcoal tablets.  Trial of Levsin.  I have discussed alternative options, risks & benefits,  which include, but are not limited to, bleeding, infection, perforation,respiratory complication & drug reaction.  The patient agrees with this plan & written consent will be obtained.    Dr Jonathon Bellows  MD,MRCP [facility]) Follow up in 6 weeks

## 2019-07-27 ENCOUNTER — Other Ambulatory Visit: Payer: Self-pay

## 2019-07-27 ENCOUNTER — Other Ambulatory Visit
Admission: RE | Admit: 2019-07-27 | Discharge: 2019-07-27 | Disposition: A | Discharge status: Home / Self Care | Payer: Medicare HMO | Source: Ambulatory Visit | Attending: Gastroenterology | Admitting: Gastroenterology

## 2019-07-27 DIAGNOSIS — Z01812 Encounter for preprocedural laboratory examination: Secondary | ICD-10-CM | POA: Diagnosis not present

## 2019-07-27 DIAGNOSIS — Z20822 Contact with and (suspected) exposure to covid-19: Secondary | ICD-10-CM | POA: Insufficient documentation

## 2019-07-27 LAB — SARS CORONAVIRUS 2 (TAT 6-24 HRS): SARS Coronavirus 2: NEGATIVE

## 2019-07-29 ENCOUNTER — Encounter: Payer: Self-pay | Admitting: Gastroenterology

## 2019-07-29 ENCOUNTER — Ambulatory Visit
Admission: RE | Admit: 2019-07-29 | Discharge: 2019-07-29 | Disposition: A | Discharge status: Home / Self Care | Payer: Medicare HMO | Attending: Gastroenterology | Admitting: Gastroenterology

## 2019-07-29 ENCOUNTER — Other Ambulatory Visit: Payer: Self-pay

## 2019-07-29 ENCOUNTER — Ambulatory Visit: Payer: Medicare HMO | Admitting: Registered Nurse

## 2019-07-29 ENCOUNTER — Encounter
Admission: RE | Disposition: A | Discharge status: Home / Self Care | Payer: Self-pay | Source: Home / Self Care | Attending: Gastroenterology

## 2019-07-29 DIAGNOSIS — K5730 Diverticulosis of large intestine without perforation or abscess without bleeding: Secondary | ICD-10-CM | POA: Diagnosis not present

## 2019-07-29 DIAGNOSIS — E785 Hyperlipidemia, unspecified: Secondary | ICD-10-CM | POA: Diagnosis not present

## 2019-07-29 DIAGNOSIS — Z7982 Long term (current) use of aspirin: Secondary | ICD-10-CM | POA: Insufficient documentation

## 2019-07-29 DIAGNOSIS — K635 Polyp of colon: Secondary | ICD-10-CM

## 2019-07-29 DIAGNOSIS — Z7902 Long term (current) use of antithrombotics/antiplatelets: Secondary | ICD-10-CM | POA: Diagnosis not present

## 2019-07-29 DIAGNOSIS — K219 Gastro-esophageal reflux disease without esophagitis: Secondary | ICD-10-CM | POA: Diagnosis not present

## 2019-07-29 DIAGNOSIS — Z79899 Other long term (current) drug therapy: Secondary | ICD-10-CM | POA: Diagnosis not present

## 2019-07-29 DIAGNOSIS — G4733 Obstructive sleep apnea (adult) (pediatric): Secondary | ICD-10-CM | POA: Insufficient documentation

## 2019-07-29 DIAGNOSIS — E119 Type 2 diabetes mellitus without complications: Secondary | ICD-10-CM | POA: Diagnosis not present

## 2019-07-29 DIAGNOSIS — H42 Glaucoma in diseases classified elsewhere: Secondary | ICD-10-CM | POA: Insufficient documentation

## 2019-07-29 DIAGNOSIS — Z1211 Encounter for screening for malignant neoplasm of colon: Secondary | ICD-10-CM | POA: Insufficient documentation

## 2019-07-29 DIAGNOSIS — R140 Abdominal distension (gaseous): Secondary | ICD-10-CM | POA: Diagnosis not present

## 2019-07-29 DIAGNOSIS — I2510 Atherosclerotic heart disease of native coronary artery without angina pectoris: Secondary | ICD-10-CM | POA: Insufficient documentation

## 2019-07-29 DIAGNOSIS — E1139 Type 2 diabetes mellitus with other diabetic ophthalmic complication: Secondary | ICD-10-CM | POA: Diagnosis not present

## 2019-07-29 DIAGNOSIS — F319 Bipolar disorder, unspecified: Secondary | ICD-10-CM | POA: Diagnosis not present

## 2019-07-29 DIAGNOSIS — J449 Chronic obstructive pulmonary disease, unspecified: Secondary | ICD-10-CM | POA: Diagnosis not present

## 2019-07-29 DIAGNOSIS — F419 Anxiety disorder, unspecified: Secondary | ICD-10-CM | POA: Insufficient documentation

## 2019-07-29 DIAGNOSIS — I10 Essential (primary) hypertension: Secondary | ICD-10-CM | POA: Diagnosis not present

## 2019-07-29 DIAGNOSIS — F17210 Nicotine dependence, cigarettes, uncomplicated: Secondary | ICD-10-CM | POA: Insufficient documentation

## 2019-07-29 DIAGNOSIS — Z91040 Latex allergy status: Secondary | ICD-10-CM | POA: Diagnosis not present

## 2019-07-29 DIAGNOSIS — Z955 Presence of coronary angioplasty implant and graft: Secondary | ICD-10-CM | POA: Insufficient documentation

## 2019-07-29 DIAGNOSIS — Z888 Allergy status to other drugs, medicaments and biological substances status: Secondary | ICD-10-CM | POA: Diagnosis not present

## 2019-07-29 DIAGNOSIS — K5521 Angiodysplasia of colon with hemorrhage: Secondary | ICD-10-CM | POA: Insufficient documentation

## 2019-07-29 DIAGNOSIS — R933 Abnormal findings on diagnostic imaging of other parts of digestive tract: Secondary | ICD-10-CM

## 2019-07-29 HISTORY — PX: COLONOSCOPY WITH PROPOFOL: SHX5780

## 2019-07-29 LAB — HM COLONOSCOPY

## 2019-07-29 SURGERY — COLONOSCOPY WITH PROPOFOL
Anesthesia: General

## 2019-07-29 MED ORDER — ONDANSETRON HCL 4 MG/2ML IJ SOLN
INTRAMUSCULAR | Status: DC | PRN
  Administered 2019-07-29: 4 mg via INTRAVENOUS

## 2019-07-29 MED ORDER — ONDANSETRON HCL 4 MG/2ML IJ SOLN
INTRAMUSCULAR | Status: AC
  Filled 2019-07-29: qty 2

## 2019-07-29 MED ORDER — ONDANSETRON HCL 4 MG/2ML IJ SOLN: 4.0000 mg | Freq: Once | INTRAMUSCULAR | Status: DC | PRN

## 2019-07-29 MED ORDER — LIDOCAINE HCL (CARDIAC) PF 100 MG/5ML IV SOSY
PREFILLED_SYRINGE | INTRAVENOUS | Status: DC | PRN
  Administered 2019-07-29: 100 mg via INTRAVENOUS

## 2019-07-29 MED ORDER — SODIUM CHLORIDE 0.9 % IV SOLN
INTRAVENOUS | Status: DC
  Administered 2019-07-29: 1000 mL via INTRAVENOUS

## 2019-07-29 MED ORDER — PROPOFOL 10 MG/ML IV BOLUS
INTRAVENOUS | Status: DC | PRN
  Administered 2019-07-29: 150 mg via INTRAVENOUS

## 2019-07-29 MED ORDER — FENTANYL CITRATE (PF) 100 MCG/2ML IJ SOLN
INTRAMUSCULAR | Status: DC | PRN
  Administered 2019-07-29 (×3): 25 ug via INTRAVENOUS
  Administered 2019-07-29: 50 ug via INTRAVENOUS
  Administered 2019-07-29: 25 ug via INTRAVENOUS
  Administered 2019-07-29: 50 ug via INTRAVENOUS

## 2019-07-29 MED ORDER — SUCCINYLCHOLINE CHLORIDE 20 MG/ML IJ SOLN
INTRAMUSCULAR | Status: DC | PRN
  Administered 2019-07-29: 100 mg via INTRAVENOUS

## 2019-07-29 MED ORDER — FENTANYL CITRATE (PF) 100 MCG/2ML IJ SOLN
INTRAMUSCULAR | Status: AC
  Filled 2019-07-29: qty 2

## 2019-07-29 MED ORDER — PROPOFOL 500 MG/50ML IV EMUL
INTRAVENOUS | Status: AC
  Filled 2019-07-29: qty 50

## 2019-07-29 MED ORDER — SUGAMMADEX SODIUM 200 MG/2ML IV SOLN
INTRAVENOUS | Status: DC | PRN
  Administered 2019-07-29 (×2): 10 mg via INTRAVENOUS

## 2019-07-29 MED ORDER — PHENYLEPHRINE HCL (PRESSORS) 10 MG/ML IV SOLN
INTRAVENOUS | Status: DC | PRN
  Administered 2019-07-29 (×2): 100 ug via INTRAVENOUS

## 2019-07-29 NOTE — Transfer of Care (Signed)
Immediate Anesthesia Transfer of Care Note  Patient: Shawna Anderson  Procedure(s) Performed: COLONOSCOPY WITH PROPOFOL (N/A )  Patient Location: PACU  Anesthesia Type:General  Level of Consciousness: sedated  Airway & Oxygen Therapy: Patient Spontanous Breathing and Patient connected to face mask oxygen  Post-op Assessment: Report given to RN and Post -op Vital signs reviewed and stable  Post vital signs: Reviewed and stable  Last Vitals:  Vitals Value Taken Time  BP 118/77 07/29/19 1138  Temp    Pulse 76 07/29/19 1138  Resp 24 07/29/19 1138  SpO2 98 % 07/29/19 1138    Last Pain:  Vitals:   07/29/19 1138  TempSrc:   PainSc: 0-No pain         Complications: No apparent anesthesia complications

## 2019-07-29 NOTE — Anesthesia Procedure Notes (Signed)
Procedure Name: Intubation Date/Time: 07/29/2019 11:06 AM Performed by: Hedda Slade, CRNA Pre-anesthesia Checklist: Patient identified, Patient being monitored, Timeout performed, Emergency Drugs available and Suction available Patient Re-evaluated:Patient Re-evaluated prior to induction Oxygen Delivery Method: Circle system utilized Preoxygenation: Pre-oxygenation with 100% oxygen Induction Type: IV induction Ventilation: Mask ventilation without difficulty Laryngoscope Size: 3 and McGraph Grade View: Grade I Tube type: Oral Tube size: 7.0 mm Number of attempts: 1 Airway Equipment and Method: Stylet Placement Confirmation: ETT inserted through vocal cords under direct vision,  positive ETCO2 and breath sounds checked- equal and bilateral Secured at: 21 cm Tube secured with: Tape Dental Injury: Teeth and Oropharynx as per pre-operative assessment

## 2019-07-29 NOTE — Anesthesia Postprocedure Evaluation (Signed)
Anesthesia Post Note  Patient: Shawna Anderson  Procedure(s) Performed: COLONOSCOPY WITH PROPOFOL (N/A )  Patient location during evaluation: PACU Anesthesia Type: General Level of consciousness: awake and alert and oriented Pain management: pain level controlled Vital Signs Assessment: post-procedure vital signs reviewed and stable Respiratory status: spontaneous breathing, nonlabored ventilation and respiratory function stable Cardiovascular status: blood pressure returned to baseline and stable Postop Assessment: no signs of nausea or vomiting Anesthetic complications: no     Last Vitals:  Vitals:   07/29/19 1154 07/29/19 1209  BP: 128/86 115/75  Pulse: 76 77  Resp:    Temp:    SpO2: 99% 99%    Last Pain:  Vitals:   07/29/19 1209  TempSrc:   PainSc: 5                  Leta Bucklin

## 2019-07-29 NOTE — H&P (Signed)
Jonathon Bellows, MD [address] [place], [place], [place], [address] [id number], [place], [place], [place], [address] Phone: [phone]  Fax: [phone]  Primary Care Physician:  McLean-Scocuzza, Nino Glow, MD   Pre-Procedure History & Physical: HPI:  Shawna Anderson is a 54 y.o. female is here for an colonoscopy.   Past Medical History:  Diagnosis Date  . Anxiety   . Asthma   . Bipolar 1 disorder ([place])   . Bipolar disorder ([place])   . CAD (coronary artery disease)    s/p stent BMS OM Cx  . Cervical herniated disc 04/12/2016  . COPD (chronic obstructive pulmonary disease) ([place])   . Depression   . Diabetes mellitus without complication ([place])   . Diverticulitis   . GERD (gastroesophageal reflux disease)   . Glaucoma   . History of blood transfusion   . Hyperlipidemia   . Hypertension   . OSA (obstructive sleep apnea)    not using cpap   . Plantar fasciitis    b/l feet s/p steroid shots w/o help and left surgery w/o help   . UTI (urinary tract infection)     Past Surgical History:  Procedure Laterality Date  . ABDOMINAL HYSTERECTOMY    . ABDOMINAL SURGERY  1995   Bowel resection.  Marland Kitchen CARDIAC CATHETERIZATION N/A 04/14/2016   Procedure: Left Heart Cath and Coronary Angiography;  Surgeon: Burnell Blanks, MD;  Location: [facility];  Service: Cardiovascular;  Laterality: N/A;  . CARDIAC SURGERY    . COLONOSCOPY WITH PROPOFOL N/A 07/11/2019   Procedure: COLONOSCOPY WITH PROPOFOL;  Surgeon: Jonathon Bellows, MD;  Location: [facility] ENDOSCOPY;  Service: Gastroenterology;  Laterality: N/A;  . CORONARY ANGIOPLASTY WITH STENT PLACEMENT  2010   Drug eluting stent  . ESOPHAGOGASTRODUODENOSCOPY (EGD) WITH PROPOFOL N/A 07/11/2019   Procedure: ESOPHAGOGASTRODUODENOSCOPY (EGD) WITH PROPOFOL;  Surgeon: Jonathon Bellows, MD;  Location: [facility] ENDOSCOPY;  Service: Gastroenterology;  Laterality: N/A;  . OVARIAN CYST REMOVAL      Prior to Admission medications   Medication  Sig Start Date End Date Taking? Authorizing Provider  ALPRAZolam Duanne Moron) 0.25 MG tablet Take 1 tablet (0.25 mg total) by mouth at bedtime as needed for anxiety or sleep. 07/05/19  Yes McLean-Scocuzza, Nino Glow, MD  amLODipine (NORVASC) 10 MG tablet Take 1 tablet (10 mg total) by mouth at bedtime. 06/29/19  Yes McLean-Scocuzza, Nino Glow, MD  aspirin EC 81 MG tablet Take 1 tablet (81 mg total) by mouth daily. 11/04/18  Yes McLean-Scocuzza, Nino Glow, MD  clopidogrel (PLAVIX) 75 MG tablet Take 1 tablet (75 mg total) by mouth daily. 04/06/19  Yes McLean-Scocuzza, Nino Glow, MD  dicyclomine (BENTYL) 10 MG capsule Take 1 capsule (10 mg total) by mouth 4 (four) times daily -  before meals and at bedtime. 07/04/19  Yes Jonathon Bellows, MD  divalproex (DEPAKOTE) 500 MG DR tablet Take 1 tablet (500 mg total) by mouth 2 (two) times daily. 04/06/19  Yes McLean-Scocuzza, Nino Glow, MD  hyoscyamine (LEVSIN) 0.125 MG tablet Take 1 tablet (0.125 mg total) by mouth every 4 (four) hours. As needed 07/20/19 01/16/20 Yes Jonathon Bellows, MD  isosorbide mononitrate (IMDUR) 30 MG 24 hr tablet Take 1 tablet (30 mg total) by mouth daily. 07/20/19 10/18/19 Yes Visser, Jacquelyn D, PA-C  lidocaine (LIDODERM) 5 % Place 1 patch onto the skin daily. Remove & Discard patch within 12 hours or as directed by MD 11/16/18  Yes McLean-Scocuzza, Nino Glow, MD  olmesartan-hydrochlorothiazide (BENICAR HCT)  40-25 MG tablet Take 1 tablet by mouth daily. 06/28/19  Yes McLean-Scocuzza, Nino Glow, MD  ondansetron (ZOFRAN ODT) 4 MG disintegrating tablet Take 1 tablet (4 mg total) by mouth every 8 (eight) hours as needed for nausea or vomiting. 07/18/19  Yes Blake Divine, MD  pantoprazole (PROTONIX) 40 MG tablet Take 1 tablet (40 mg total) by mouth 2 (two) times daily. In am before food 05/18/19 11/14/19 Yes Jonathon Bellows, MD  senna-docusate (SENOKOT-S) 8.6-50 MG tablet Take 1-2 tablets by mouth daily as needed for mild constipation. 02/18/19  Yes McLean-Scocuzza, Nino Glow, MD    traZODone (DESYREL) 150 MG tablet Take 1 tablet (150 mg total) by mouth at bedtime as needed for sleep. 04/06/19  Yes McLean-Scocuzza, Nino Glow, MD  albuterol (PROVENTIL) (2.5 MG/3ML) 0.083% nebulizer solution Take 3 mLs (2.5 mg total) by nebulization every 6 (six) hours as needed for wheezing or shortness of breath. 04/06/19   McLean-Scocuzza, Nino Glow, MD  Albuterol Sulfate (PROAIR RESPICLICK) 123XX123 (90 Base) MCG/ACT AEPB Inhale 1 Inhaler into the lungs every 4 (four) hours as needed. 04/14/19   McLean-Scocuzza, Nino Glow, MD  atorvastatin (LIPITOR) 80 MG tablet Take 1 tablet (80 mg total) by mouth daily at 6 PM. 04/06/19 07/05/19  McLean-Scocuzza, Nino Glow, MD  cyclobenzaprine (FLEXERIL) 5 MG tablet Take 1 tablet (5 mg total) by mouth at bedtime as needed for muscle spasms. 01/20/19   McLean-Scocuzza, Nino Glow, MD  ipratropium-albuterol (DUONEB) 0.5-2.5 (3) MG/3ML SOLN Take 3 mLs by nebulization every 6 (six) hours as needed. 12/29/17   McLean-Scocuzza, Nino Glow, MD  metroNIDAZOLE (FLAGYL) 500 MG tablet Take 1 tablet (500 mg total) by mouth 2 (two) times daily after a meal. Patient not taking: Reported on 07/11/2019 05/16/19   Lavonia Drafts, MD  nitroGLYCERIN (NITROSTAT) 0.4 MG SL tablet Place 1 tablet (0.4 mg total) under the tongue every 5 (five) minutes as needed for chest pain. X 2 call 911 after 3rd dose 11/04/18   McLean-Scocuzza, Nino Glow, MD  valACYclovir (VALTREX) 1000 MG tablet Take 1 tablet (1,000 mg total) by mouth 3 (three) times daily. Patient not taking: Reported on 07/29/2019 11/16/18   McLean-Scocuzza, Nino Glow, MD    Allergies as of 07/20/2019 - Review Complete 07/20/2019  Allergen Reaction Noted  . Ativan [lorazepam]  11/14/2017  . Latex Other (See Comments) 02/12/2017  . Tape Other (See Comments) 02/12/2017  . Drixoral allergy sinus [dexbromphen-pse-apap er] Rash 01/12/2015    Family History  Problem Relation Age of Onset  . CAD Mother   . Depression Mother   . Heart disease Mother   .  Hyperlipidemia Mother   . Hypertension Mother   . CAD Brother   . Depression Brother   . Diabetes Brother   . Heart disease Brother   . Hyperlipidemia Brother   . Heart disease Father   . Alcohol abuse Father     Social History   Socioeconomic History  . Marital status: Single    Spouse name: Not on file  . Number of children: Not on file  . Years of education: Not on file  . Highest education level: Not on file  Occupational History  . Not on file  Tobacco Use  . Smoking status: Current Every Day Smoker    Packs/day: 0.50    Years: 30.00    Pack years: 15.00    Types: Cigarettes  . Smokeless tobacco: Never Used  . Tobacco comment: refused  Substance and Sexual Activity  . Alcohol use:  Not Currently    Comment: once a year  . Drug use: Yes    Frequency: 7.0 times per week    Types: Marijuana  . Sexual activity: Not on file  Other Topics Concern  . Not on file  Social History Narrative   From [place] now living in [place]    1 son    No guns    Wears seat belt   Safe in relationship    Social Determinants of Health   Financial Resource Strain:   . Difficulty of Paying Living Expenses: Not on file  Food Insecurity:   . Worried About Charity fundraiser in the Last Year: Not on file  . Ran Out of Food in the Last Year: Not on file  Transportation Needs:   . Lack of Transportation (Medical): Not on file  . Lack of Transportation (Non-Medical): Not on file  Physical Activity:   . Days of Exercise per Week: Not on file  . Minutes of Exercise per Session: Not on file  Stress:   . Feeling of Stress : Not on file  Social Connections:   . Frequency of Communication with Friends and Family: Not on file  . Frequency of Social Gatherings with Friends and Family: Not on file  . Attends Religious Services: Not on file  . Active Member of Clubs or Organizations: Not on file  . Attends Archivist Meetings: Not on file  . Marital Status: Not on  file  Intimate Partner Violence:   . Fear of Current or Ex-Partner: Not on file  . Emotionally Abused: Not on file  . Physically Abused: Not on file  . Sexually Abused: Not on file    Review of Systems: See HPI, otherwise negative ROS  Physical Exam: BP (!) 149/86   Pulse 79   Temp (!) 97.1 F (36.2 C) (Temporal)   Resp 18   Ht 5\' 3"  (1.6 m)   Wt 86.4 kg   SpO2 100%   BMI 33.73 kg/m  General:   Alert,  pleasant and cooperative in NAD Head:  Normocephalic and atraumatic. Neck:  Supple; no masses or thyromegaly. Lungs:  Clear throughout to auscultation, normal respiratory effort.    Heart:  +S1, +S2, Regular rate and rhythm, No edema. Abdomen:  Soft, nontender and nondistended. Normal bowel sounds, without guarding, and without rebound.   Neurologic:  Alert and  oriented x4;  grossly normal neurologically.  Impression/Plan: Shawna Anderson is here for an colonoscopy to be performed for Screening colonoscopy average risk   Risks, benefits, limitations, and alternatives regarding  colonoscopy have been reviewed with the patient.  Questions have been answered.  All parties agreeable.   Jonathon Bellows, MD  07/29/2019, 10:59 AM

## 2019-07-29 NOTE — Anesthesia Preprocedure Evaluation (Signed)
Anesthesia Evaluation  Patient identified by MRN, date of birth, ID band Patient awake    Reviewed: Allergy & Precautions, NPO status , Patient's Chart, lab work & pertinent test results  History of Anesthesia Complications Negative for: history of anesthetic complications  Airway Mallampati: II  TM Distance: >3 FB Neck ROM: Full    Dental  (+) Partial Lower   Pulmonary asthma , sleep apnea , COPD,  COPD inhaler, Current Smoker and Patient abstained from smoking.,    breath sounds clear to auscultation- rhonchi (-) wheezing      Cardiovascular hypertension, (-) angina+ CAD, + Past MI and + Cardiac Stents (2010)  (-) CABG  Rhythm:Regular Rate:Normal - Systolic murmurs and - Diastolic murmurs    Neuro/Psych neg Seizures PSYCHIATRIC DISORDERS Anxiety Depression Bipolar Disorder negative neurological ROS     GI/Hepatic Neg liver ROS, GERD  ,  Endo/Other  diabetes  Renal/GU negative Renal ROS     Musculoskeletal negative musculoskeletal ROS (+)   Abdominal (+) + obese,   Peds  Hematology negative hematology ROS (+)   Anesthesia Other Findings Past Medical History: No date: Anxiety No date: Asthma No date: Bipolar 1 disorder ([facility]) No date: Bipolar disorder ([place]) No date: CAD (coronary artery disease)     Comment:  s/p stent BMS OM Cx 04/12/2016: Cervical herniated disc No date: COPD (chronic obstructive pulmonary disease) ([facility]) No date: Depression No date: Diabetes mellitus without complication ([facility]) No date: Diverticulitis No date: GERD (gastroesophageal reflux disease) No date: Glaucoma No date: History of blood transfusion No date: Hyperlipidemia No date: Hypertension No date: OSA (obstructive sleep apnea)     Comment:  not using cpap  No date: Plantar fasciitis     Comment:  b/l feet s/p steroid shots w/o help and left surgery w/o              help  No date: UTI (urinary tract infection)   Reproductive/Obstetrics                             Anesthesia Physical Anesthesia Plan  ASA: III  Anesthesia Plan: General   Post-op Pain Management:    Induction: Intravenous  PONV Risk Score and Plan: 1 and Ondansetron  Airway Management Planned: Oral ETT  Additional Equipment:   Intra-op Plan:   Post-operative Plan: Extubation in OR  Informed Consent: I have reviewed the patients History and Physical, chart, labs and discussed the procedure including the risks, benefits and alternatives for the proposed anesthesia with the patient or authorized representative who has indicated his/her understanding and acceptance.     Dental advisory given  Plan Discussed with: CRNA and Anesthesiologist  Anesthesia Plan Comments: (Prior procedure was aborted due to inadequate sedation with high doses of propofol, pt moving despite being unconscious, unable to sedate further due to apnea. Plan for GETA today.)       Anesthesia Quick Evaluation

## 2019-07-29 NOTE — Op Note (Signed)
[facility] Patient Name: Shawna Anderson Procedure Date: 07/29/2019 10:42 AM MRN: [id number] Account #: [id number] Date of Birth: [birth year] Admit Type: Outpatient Age: 54 Room: [facility] ENDO ROOM 3 Gender: Female Note Status: Finalized Procedure:             Colonoscopy Indications:           Screening for colorectal malignant neoplasm Providers:             Jonathon Bellows MD, MD Referring MD:          Nino Glow Mclean-Scocuzza MD, MD (Referring MD) Medicines:             Monitored Anesthesia Care, General Anesthesia Complications:         No immediate complications. Procedure:             Pre-Anesthesia Assessment:                        - Prior to the procedure, a History and Physical was                         performed, and patient medications, allergies and                         sensitivities were reviewed. The patient's tolerance                         of previous anesthesia was reviewed.                        - The risks and benefits of the procedure and the                         sedation options and risks were discussed with the                         patient. All questions were answered and informed                         consent was obtained.                        - ASA Grade Assessment: III - A patient with severe                         systemic disease.                        After obtaining informed consent, the colonoscope was                         passed under direct vision. Throughout the procedure,                         the patient's blood pressure, pulse, and oxygen                         saturations were monitored continuously. The                         Colonoscope was introduced  through the anus and                         advanced to the the cecum, identified by the                         appendiceal orifice. The colonoscopy was performed                         with ease. The patient tolerated the procedure  well.                         The quality of the bowel preparation was excellent. Findings:      The perianal and digital rectal examinations were normal.      A 5 mm polyp was found in the distal ascending colon. The polyp was       sessile. The polyp was removed with a cold snare. Resection and       retrieval were complete. To prevent bleeding post-intervention, one       hemostatic clip was successfully placed. There was no bleeding during,       or at the end, of the procedure.      The exam was otherwise without abnormality.      Multiple small-mouthed diverticula were found in the descending colon. Impression:            - One 5 mm polyp in the distal ascending colon,                         removed with a cold snare. Resected and retrieved.                         Clip was placed.                        - The examination was otherwise normal. Recommendation:        - Discharge patient to home (with escort).                        - Resume previous diet.                        - Continue present medications.                        - Await pathology results.                        - Repeat colonoscopy for surveillance based on                         pathology results. Procedure Code(s):     --- Professional ---                        [phone], Colonoscopy, flexible; with removal of                         tumor(s), polyp(s), or other lesion(s) by snare  technique Diagnosis Code(s):     --- Professional ---                        Z12.11, Encounter for screening for malignant neoplasm                         of colon                        K63.5, Polyp of colon CPT copyright 2019 [employer] Medical Association. All rights reserved. The codes documented in this report are preliminary and upon coder review may  be revised to meet current compliance requirements. Jonathon Bellows, MD Jonathon Bellows MD, MD 07/29/2019 11:31:31 AM This report has been signed  electronically. Number of Addenda: 0 Note Initiated On: 07/29/2019 10:42 AM Scope Withdrawal Time: 0 hours 15 minutes 26 seconds  Total Procedure Duration: 0 hours 18 minutes 27 seconds  Estimated Blood Loss:  Estimated blood loss: none.      [facility]

## 2019-08-01 ENCOUNTER — Encounter: Payer: Self-pay | Admitting: *Deleted

## 2019-08-02 LAB — SURGICAL PATHOLOGY

## 2019-08-07 ENCOUNTER — Encounter: Payer: Self-pay | Admitting: Gastroenterology

## 2019-08-18 ENCOUNTER — Encounter: Payer: Self-pay | Admitting: Internal Medicine

## 2019-08-18 ENCOUNTER — Ambulatory Visit (INDEPENDENT_AMBULATORY_CARE_PROVIDER_SITE_OTHER): Payer: Medicare Other | Admitting: Internal Medicine

## 2019-08-18 ENCOUNTER — Other Ambulatory Visit: Payer: Self-pay

## 2019-08-18 VITALS — BP 118/71 | Ht 63.0 in | Wt 187.0 lb

## 2019-08-18 DIAGNOSIS — R0789 Other chest pain: Secondary | ICD-10-CM

## 2019-08-18 DIAGNOSIS — D75839 Thrombocytosis, unspecified: Secondary | ICD-10-CM

## 2019-08-18 DIAGNOSIS — E785 Hyperlipidemia, unspecified: Secondary | ICD-10-CM | POA: Diagnosis not present

## 2019-08-18 DIAGNOSIS — K089 Disorder of teeth and supporting structures, unspecified: Secondary | ICD-10-CM | POA: Diagnosis not present

## 2019-08-18 DIAGNOSIS — I10 Essential (primary) hypertension: Secondary | ICD-10-CM | POA: Diagnosis not present

## 2019-08-18 DIAGNOSIS — D473 Essential (hemorrhagic) thrombocythemia: Secondary | ICD-10-CM

## 2019-08-18 DIAGNOSIS — I2510 Atherosclerotic heart disease of native coronary artery without angina pectoris: Secondary | ICD-10-CM

## 2019-08-18 DIAGNOSIS — D72829 Elevated white blood cell count, unspecified: Secondary | ICD-10-CM | POA: Diagnosis not present

## 2019-08-18 DIAGNOSIS — K629 Disease of anus and rectum, unspecified: Secondary | ICD-10-CM

## 2019-08-18 DIAGNOSIS — Z9861 Coronary angioplasty status: Secondary | ICD-10-CM

## 2019-08-18 NOTE — Progress Notes (Signed)
telephone Note  I connected with [facility]  on 08/18/19 at  3:15 PM EST by telephone and verified that I am speaking with the correct person using two identifiers.  Location patient: home Location provider:work or home office Persons participating in the virtual visit: patient, provider  I discussed the limitations of evaluation and management by telemedicine and the availability of in person appointments. The patient expressed understanding and agreed to proceed.   HPI: 1. Poor dentition needs dental work done in the future  2. HTN improved per pt BP 118/71 she is unsure of meds and thinks she is taking norvasc 10 mg x 2 pills daily=20 mg disc with pt this is too high of a dose, also on imdur 30 mg qd, benicar hctz 40-25 mg qd  BP per pt running 110/120s sbp 3. Chest pain resolved with new meds of cards I.e imdur and feels like MSK per pt at times b/l breast pain as well  She also has h/o CAD and PCI will f/u cards  4. FOBT + colonoscopy 08/04/19 with 5 mm polyp sessile, diverticulosis  Neg path proven to be mucosal telangiectasia and hemorrhage EGD 07/11/19 [place] ring neg pathology  5. Persistent elevated WBC and platelets will refer to H/o  6. HLD uncontrolled on lipitor 80 mg qhs advised this is the highest dose and she cant take more than this dose  7. She c/w anal region huge lesion outside ? Hemorrhoid no comment on colonoscopy of hemorrhoid will have her f/u with GI unclear if should be on levsin of dicyclomine or both typically use 1 agent will disc with GI   ROS: See pertinent positives and negatives per HPI.  Past Medical History:  Diagnosis Date  . Anxiety   . Asthma   . Bipolar 1 disorder ([place])   . Bipolar disorder ([place])   . CAD (coronary artery disease)    s/p stent BMS OM Cx  . Cervical herniated disc 04/12/2016  . COPD (chronic obstructive pulmonary disease) ([place])   . Depression   . Diabetes mellitus without complication ([place])   . Diverticulitis   . GERD  (gastroesophageal reflux disease)   . Glaucoma   . History of blood transfusion   . Hyperlipidemia   . Hypertension   . OSA (obstructive sleep apnea)    not using cpap   . Plantar fasciitis    b/l feet s/p steroid shots w/o help and left surgery w/o help   . UTI (urinary tract infection)     Past Surgical History:  Procedure Laterality Date  . ABDOMINAL HYSTERECTOMY    . ABDOMINAL SURGERY  1995   Bowel resection.  Marland Kitchen CARDIAC CATHETERIZATION N/A 04/14/2016   Procedure: Left Heart Cath and Coronary Angiography;  Surgeon: Burnell Blanks, MD;  Location: [place] [facility];  Service: Cardiovascular;  Laterality: N/A;  . CARDIAC SURGERY    . COLONOSCOPY WITH PROPOFOL N/A 07/11/2019   Procedure: COLONOSCOPY WITH PROPOFOL;  Surgeon: Jonathon Bellows, MD;  Location: [facility] ENDOSCOPY;  Service: Gastroenterology;  Laterality: N/A;  . COLONOSCOPY WITH PROPOFOL N/A 07/29/2019   Procedure: COLONOSCOPY WITH PROPOFOL;  Surgeon: Jonathon Bellows, MD;  Location[facility] ENDOSCOPY;  Service: Gastroenterology;  Laterality: N/A;  . CORONARY ANGIOPLASTY WITH STENT PLACEMENT  2010   Drug eluting stent  . ESOPHAGOGASTRODUODENOSCOPY (EGD) WITH PROPOFOL N/A 07/11/2019   Procedure: ESOPHAGOGASTRODUODENOSCOPY (EGD) WITH PROPOFOL;  Surgeon: Jonathon Bellows, MD;  Location: [facility] ENDOSCOPY;  Service: Gastroenterology;  Laterality: N/A;  . OVARIAN CYST REMOVAL  Family History  Problem Relation Age of Onset  . CAD Mother   . Depression Mother   . Heart disease Mother   . Hyperlipidemia Mother   . Hypertension Mother   . CAD Brother   . Depression Brother   . Diabetes Brother   . Heart disease Brother   . Hyperlipidemia Brother   . Heart disease Father   . Alcohol abuse Father     SOCIAL HX:  From [place] now living in [place]  1 son  No guns  Wears seat belt Safe in relationship    Current Outpatient Medications:  .  albuterol (PROVENTIL) (2.5 MG/3ML) 0.083% nebulizer solution, Take 3 mLs  (2.5 mg total) by nebulization every 6 (six) hours as needed for wheezing or shortness of breath., Disp: 360 mL, Rfl: 12 .  Albuterol Sulfate (PROAIR RESPICLICK) 123XX123 (90 Base) MCG/ACT AEPB, Inhale 1 Inhaler into the lungs every 4 (four) hours as needed., Disp: 3 each, Rfl: 4 .  ALPRAZolam (XANAX) 0.25 MG tablet, Take 1 tablet (0.25 mg total) by mouth at bedtime as needed for anxiety or sleep., Disp: 30 tablet, Rfl: 2 .  amLODipine (NORVASC) 10 MG tablet, Take 1 tablet (10 mg total) by mouth at bedtime., Disp: 90 tablet, Rfl: 3 .  aspirin EC 81 MG tablet, Take 1 tablet (81 mg total) by mouth daily., Disp: 90 tablet, Rfl: 3 .  clopidogrel (PLAVIX) 75 MG tablet, Take 1 tablet (75 mg total) by mouth daily., Disp: 90 tablet, Rfl: 3 .  cyclobenzaprine (FLEXERIL) 5 MG tablet, Take 1 tablet (5 mg total) by mouth at bedtime as needed for muscle spasms., Disp: 30 tablet, Rfl: 0 .  dicyclomine (BENTYL) 10 MG capsule, Take 1 capsule (10 mg total) by mouth 4 (four) times daily -  before meals and at bedtime., Disp: 120 capsule, Rfl: 2 .  divalproex (DEPAKOTE) 500 MG DR tablet, Take 1 tablet (500 mg total) by mouth 2 (two) times daily., Disp: 180 tablet, Rfl: 1 .  hyoscyamine (LEVSIN) 0.125 MG tablet, Take 1 tablet (0.125 mg total) by mouth every 4 (four) hours. As needed, Disp: 180 tablet, Rfl: 5 .  ipratropium-albuterol (DUONEB) 0.5-2.5 (3) MG/3ML SOLN, Take 3 mLs by nebulization every 6 (six) hours as needed., Disp: 360 mL, Rfl: 11 .  isosorbide mononitrate (IMDUR) 30 MG 24 hr tablet, Take 1 tablet (30 mg total) by mouth daily., Disp: 90 tablet, Rfl: 0 .  nitroGLYCERIN (NITROSTAT) 0.4 MG SL tablet, Place 1 tablet (0.4 mg total) under the tongue every 5 (five) minutes as needed for chest pain. X 2 call 911 after 3rd dose, Disp: 30 tablet, Rfl: 5 .  olmesartan-hydrochlorothiazide (BENICAR HCT) 40-25 MG tablet, Take 1 tablet by mouth daily., Disp: 90 tablet, Rfl: 3 .  ondansetron (ZOFRAN ODT) 4 MG disintegrating  tablet, Take 1 tablet (4 mg total) by mouth every 8 (eight) hours as needed for nausea or vomiting., Disp: 12 tablet, Rfl: 0 .  pantoprazole (PROTONIX) 40 MG tablet, Take 1 tablet (40 mg total) by mouth 2 (two) times daily. In am before food, Disp: 60 tablet, Rfl: 5 .  senna-docusate (SENOKOT-S) 8.6-50 MG tablet, Take 1-2 tablets by mouth daily as needed for mild constipation., Disp: 90 tablet, Rfl: 1 .  traZODone (DESYREL) 150 MG tablet, Take 1 tablet (150 mg total) by mouth at bedtime as needed for sleep., Disp: 90 tablet, Rfl: 3 .  atorvastatin (LIPITOR) 80 MG tablet, Take 1 tablet (80 mg total) by  mouth daily at 6 PM., Disp: 90 tablet, Rfl: 3 .  ezetimibe (ZETIA) 10 MG tablet, Take 1 tablet (10 mg total) by mouth daily. With lipitor 80 mg at night, Disp: 90 tablet, Rfl: 3 .  lidocaine (LIDODERM) 5 %, Place 1 patch onto the skin daily. Remove & Discard patch within 12 hours or as directed by MD, Disp: 30 patch, Rfl: 0  EXAM:  VITALS per patient if applicable:  GENERAL: alert, oriented, appears well and in no acute distress  PSYCH/NEURO: pleasant and cooperative, no obvious depression or anxiety, speech and thought processing grossly intact  ASSESSMENT AND PLAN:  Discussed the following assessment and plan:  Essential hypertension Improved on benicar hct 40-25 Disc max dose norvasc is 10 mg do not take more than this she is stating cards told her it was ok to take additional dose of norvasc 10 mg but do not think so and will confirm with cards   Poor dentition Needs to f/u dental   Thrombocytosis ([place]) ? 2/2 smoking- Plan: Ambulatory referral to Hematology Leukocytosis, unspecified type - Plan: Ambulatory referral to Hematology  Dyslipidemia - Plan: ezetimibe (ZETIA) 10 MG tablet add this to lipitor 80 mg qhs  CAD S/P CFX PCI 2010 - Plan: ezetimibe (ZETIA) 10 MG tablet-->f/u cards  Hyperlipidemia, unspecified hyperlipidemia type - Plan: ezetimibe (ZETIA) 10 MG tablet Control risk  factors   Chest wall pain -ask cards if voltaren gel ok given history   Anal lesion no comment of hemmorhoids on colonoscopy 07/2019  Will have pt f/u with GI  Also need to disc with GI if should be on levsin or Dicyclomine or both typically we use 1 agent   HM Declines flu shot  Tdapwill do in future Consider pna 23, shingrix and in future if has not had  Will disc covid 19 vx in future   S/p hysterectomy will ask at f/u if had abnormal pap ? If left ovary still intact right ovary appears out per imaging  -established with [facility]   Dr. Jonathon Bellows colonoscopy had 07/11/19 and 07/29/19 and EGD had 07/11/19 with path no malignancy concern  H/o sigmoid resection in past for h/o diverticulitis  -referredmammogramhas not scheduled yetas of 11/12/2020encouraged to schedule -given # again to call and sch as of 08/18/19 for mammogram   rec smoking cessation smoking < or = 0.5 ppd also using THC  -we discussed possible serious and likely etiologies, options for evaluation and workup, limitations of telemedicine visit vs in person visit, treatment, treatment risks and precautions. Pt prefers to treat via telemedicine empirically rather then risking or undertaking an in person visit at this moment. Patient agrees to seek prompt in person care if worsening, new symptoms arise, or if is not improving with treatment.   I discussed the assessment and treatment plan with the patient. The patient was provided an opportunity to ask questions and all were answered. The patient agreed with the plan and demonstrated an understanding of the instructions.   The patient was advised to call back or seek an in-person evaluation if the symptoms worsen or if the condition fails to improve as anticipated.  Time spent 30 minutes  Delorise Jackson, MD

## 2019-08-18 NOTE — Patient Instructions (Addendum)
[facility]  mammo  [phone]  Managing Your Hypertension Hypertension is commonly called high blood pressure. This is when the force of your blood pressing against the walls of your arteries is too strong. Arteries are blood vessels that carry blood from your heart throughout your body. Hypertension forces the heart to work harder to pump blood, and may cause the arteries to become narrow or stiff. Having untreated or uncontrolled hypertension can cause heart attack, stroke, kidney disease, and other problems. What are blood pressure readings? A blood pressure reading consists of a higher number over a lower number. Ideally, your blood pressure should be below 120/80. The first ("top") number is called the systolic pressure. It is a measure of the pressure in your arteries as your heart beats. The second ("bottom") number is called the diastolic pressure. It is a measure of the pressure in your arteries as the heart relaxes. What does my blood pressure reading mean? Blood pressure is classified into four stages. Based on your blood pressure reading, your health care provider may use the following stages to determine what type of treatment you need, if any. Systolic pressure and diastolic pressure are measured in a unit called mm Hg. Normal  Systolic pressure: below 123456.  Diastolic pressure: below 80. Elevated  Systolic pressure: [id number].  Diastolic pressure: below 80. Hypertension stage 1  Systolic pressure: 0000000.  Diastolic pressure: [id number]. Hypertension stage 2  Systolic pressure: [id number] or above.  Diastolic pressure: 90 or above. What health risks are associated with hypertension? Managing your hypertension is an important responsibility. Uncontrolled hypertension can lead to:  A heart attack.  A stroke.  A weakened blood vessel (aneurysm).  Heart failure.  Kidney damage.  Eye damage.  Metabolic syndrome.  Memory and concentration problems. What  changes can I make to manage my hypertension? Hypertension can be managed by making lifestyle changes and possibly by taking medicines. Your health care provider will help you make a plan to bring your blood pressure within a normal range. Eating and drinking   Eat a diet that is high in fiber and potassium, and low in salt (sodium), added sugar, and fat. An example eating plan is called the DASH (Dietary Approaches to Stop Hypertension) diet. To eat this way: ? Eat plenty of fresh fruits and vegetables. Try to fill half of your plate at each meal with fruits and vegetables. ? Eat whole grains, such as whole wheat pasta, brown rice, or whole grain bread. Fill about one quarter of your plate with whole grains. ? Eat low-fat diary products. ? Avoid fatty cuts of meat, processed or cured meats, and poultry with skin. Fill about one quarter of your plate with lean proteins such as fish, chicken without skin, beans, eggs, and tofu. ? Avoid premade and processed foods. These tend to be higher in sodium, added sugar, and fat.  Reduce your daily sodium intake. Most people with hypertension should eat less than 1,500 mg of sodium a day.  Limit alcohol intake to no more than 1 drink a day for nonpregnant women and 2 drinks a day for men. One drink equals 12 oz of beer, 5 oz of wine, or 1 oz of hard liquor. Lifestyle  Work with your health care provider to maintain a healthy body weight, or to lose weight. Ask what an ideal weight is for you.  Get at least 30 minutes of exercise that causes your heart to beat faster (aerobic exercise) most days of the week.  Activities may include walking, swimming, or biking.  Include exercise to strengthen your muscles (resistance exercise), such as weight lifting, as part of your weekly exercise routine. Try to do these types of exercises for 30 minutes at least 3 days a week.  Do not use any products that contain nicotine or tobacco, such as cigarettes and  e-cigarettes. If you need help quitting, ask your health care provider.  Control any long-term (chronic) conditions you have, such as high cholesterol or diabetes. Monitoring  Monitor your blood pressure at home as told by your health care provider. Your personal target blood pressure may vary depending on your medical conditions, your age, and other factors.  Have your blood pressure checked regularly, as often as told by your health care provider. Working with your health care provider  Review all the medicines you take with your health care provider because there may be side effects or interactions.  Talk with your health care provider about your diet, exercise habits, and other lifestyle factors that may be contributing to hypertension.  Visit your health care provider regularly. Your health care provider can help you create and adjust your plan for managing hypertension. Will I need medicine to control my blood pressure? Your health care provider may prescribe medicine if lifestyle changes are not enough to get your blood pressure under control, and if:  Your systolic blood pressure is 130 or higher.  Your diastolic blood pressure is 80 or higher. Take medicines only as told by your health care provider. Follow the directions carefully. Blood pressure medicines must be taken as prescribed. The medicine does not work as well when you skip doses. Skipping doses also puts you at risk for problems. Contact a health care provider if:  You think you are having a reaction to medicines you have taken.  You have repeated (recurrent) headaches.  You feel dizzy.  You have swelling in your ankles.  You have trouble with your vision. Get help right away if:  You develop a severe headache or confusion.  You have unusual weakness or numbness, or you feel faint.  You have severe pain in your chest or abdomen.  You vomit repeatedly.  You have trouble breathing. Summary  Hypertension  is when the force of blood pumping through your arteries is too strong. If this condition is not controlled, it may put you at risk for serious complications.  Your personal target blood pressure may vary depending on your medical conditions, your age, and other factors. For most people, a normal blood pressure is less than 120/80.  Hypertension is managed by lifestyle changes, medicines, or both. Lifestyle changes include weight loss, eating a healthy, low-sodium diet, exercising more, and limiting alcohol. This information is not intended to replace advice given to you by your health care provider. Make sure you discuss any questions you have with your health care provider. Document Revised: 10/22/2018 Document Reviewed: 05/28/2016 Elsevier Patient Education  [place] DASH stands for "Dietary Approaches to Stop Hypertension." The DASH eating plan is a healthy eating plan that has been shown to reduce high blood pressure (hypertension). It may also reduce your risk for type 2 diabetes, heart disease, and stroke. The DASH eating plan may also help with weight loss. What are tips for following this plan?  General guidelines  Avoid eating more than 2,300 mg (milligrams) of salt (sodium) a day. If you have hypertension, you may need to reduce your sodium intake to 1,500 mg  a day.  Limit alcohol intake to no more than 1 drink a day for nonpregnant women and 2 drinks a day for men. One drink equals 12 oz of beer, 5 oz of wine, or 1 oz of hard liquor.  Work with your health care provider to maintain a healthy body weight or to lose weight. Ask what an ideal weight is for you.  Get at least 30 minutes of exercise that causes your heart to beat faster (aerobic exercise) most days of the week. Activities may include walking, swimming, or biking.  Work with your health care provider or diet and nutrition specialist (dietitian) to adjust your eating plan to your individual  calorie needs. Reading food labels   Check food labels for the amount of sodium per serving. Choose foods with less than 5 percent of the Daily Value of sodium. Generally, foods with less than 300 mg of sodium per serving fit into this eating plan.  To find whole grains, look for the word "whole" as the first word in the ingredient list. Shopping  Buy products labeled as "low-sodium" or "no salt added."  Buy fresh foods. Avoid canned foods and premade or frozen meals. Cooking  Avoid adding salt when cooking. Use salt-free seasonings or herbs instead of table salt or sea salt. Check with your health care provider or pharmacist before using salt substitutes.  Do not fry foods. Cook foods using healthy methods such as baking, boiling, grilling, and broiling instead.  Cook with heart-healthy oils, such as olive, canola, soybean, or sunflower oil. Meal planning  Eat a balanced diet that includes: ? 5 or more servings of fruits and vegetables each day. At each meal, try to fill half of your plate with fruits and vegetables. ? Up to 6-8 servings of whole grains each day. ? Less than 6 oz of lean meat, poultry, or fish each day. A 3-oz serving of meat is about the same size as a deck of cards. One egg equals 1 oz. ? 2 servings of low-fat dairy each day. ? A serving of nuts, seeds, or beans 5 times each week. ? Heart-healthy fats. Healthy fats called Omega-3 fatty acids are found in foods such as flaxseeds and coldwater fish, like sardines, salmon, and mackerel.  Limit how much you eat of the following: ? Canned or prepackaged foods. ? Food that is high in trans fat, such as fried foods. ? Food that is high in saturated fat, such as fatty meat. ? Sweets, desserts, sugary drinks, and other foods with added sugar. ? Full-fat dairy products.  Do not salt foods before eating.  Try to eat at least 2 vegetarian meals each week.  Eat more home-cooked food and less restaurant, buffet, and fast  food.  When eating at a restaurant, ask that your food be prepared with less salt or no salt, if possible. What foods are recommended? The items listed may not be a complete list. Talk with your dietitian about what dietary choices are best for you. Grains Whole-grain or whole-wheat bread. Whole-grain or whole-wheat pasta. Brown rice. [place]. Bulgur. Whole-grain and low-sodium cereals. Pita bread. Low-fat, low-sodium crackers. Whole-wheat flour tortillas. Vegetables Fresh or frozen vegetables (raw, steamed, roasted, or grilled). Low-sodium or reduced-sodium tomato and vegetable juice. Low-sodium or reduced-sodium tomato sauce and tomato paste. Low-sodium or reduced-sodium canned vegetables. Fruits All fresh, dried, or frozen fruit. Canned fruit in natural juice (without added sugar). Meat and other protein foods Skinless chicken or [place]. Ground chicken or [place].  Pork with fat trimmed off. Fish and seafood. Egg whites. Dried beans, peas, or lentils. Unsalted nuts, nut butters, and seeds. Unsalted canned beans. Lean cuts of beef with fat trimmed off. Low-sodium, lean deli meat. Dairy Low-fat (1%) or fat-free (skim) milk. Fat-free, low-fat, or reduced-fat cheeses. Nonfat, low-sodium ricotta or cottage cheese. Low-fat or nonfat yogurt. Low-fat, low-sodium cheese. Fats and oils Soft margarine without trans fats. Vegetable oil. Low-fat, reduced-fat, or light mayonnaise and salad dressings (reduced-sodium). Canola, safflower, olive, soybean, and sunflower oils. Avocado. Seasoning and other foods Herbs. Spices. Seasoning mixes without salt. Unsalted popcorn and pretzels. Fat-free sweets. What foods are not recommended? The items listed may not be a complete list. Talk with your dietitian about what dietary choices are best for you. Grains Baked goods made with fat, such as croissants, muffins, or some breads. Dry pasta or rice meal packs. Vegetables Creamed or fried vegetables. Vegetables  in a cheese sauce. Regular canned vegetables (not low-sodium or reduced-sodium). Regular canned tomato sauce and paste (not low-sodium or reduced-sodium). Regular tomato and vegetable juice (not low-sodium or reduced-sodium). Angie Fava. Olives. Fruits Canned fruit in a light or heavy syrup. Fried fruit. Fruit in cream or butter sauce. Meat and other protein foods Fatty cuts of meat. Ribs. Fried meat. Berniece Salines. Sausage. Bologna and other processed lunch meats. Salami. Fatback. Hotdogs. Bratwurst. Salted nuts and seeds. Canned beans with added salt. Canned or smoked fish. Whole eggs or egg yolks. Chicken or [place] with skin. Dairy Whole or 2% milk, cream, and half-and-half. Whole or full-fat cream cheese. Whole-fat or sweetened yogurt. Full-fat cheese. Nondairy creamers. Whipped toppings. Processed cheese and cheese spreads. Fats and oils Butter. Stick margarine. Lard. Shortening. Ghee. Bacon fat. Tropical oils, such as coconut, palm kernel, or palm oil. Seasoning and other foods Salted popcorn and pretzels. Onion salt, garlic salt, seasoned salt, table salt, and sea salt. Worcestershire sauce. Tartar sauce. Barbecue sauce. Teriyaki sauce. Soy sauce, including reduced-sodium. Steak sauce. Canned and packaged gravies. Fish sauce. Oyster sauce. Cocktail sauce. Horseradish that you find on the shelf. Ketchup. Mustard. Meat flavorings and tenderizers. Bouillon cubes. Hot sauce and Tabasco sauce. Premade or packaged marinades. Premade or packaged taco seasonings. Relishes. Regular salad dressings. Where to find more information:  National Heart, Lung, and [place]: https://wilson-eaton.com/  American Heart Association: www.heart.org Summary  The DASH eating plan is a healthy eating plan that has been shown to reduce high blood pressure (hypertension). It may also reduce your risk for type 2 diabetes, heart disease, and stroke.  With the DASH eating plan, you should limit salt (sodium) intake to 2,300 mg a  day. If you have hypertension, you may need to reduce your sodium intake to 1,500 mg a day.  When on the DASH eating plan, aim to eat more fresh fruits and vegetables, whole grains, lean proteins, low-fat dairy, and heart-healthy fats.  Work with your health care provider or diet and nutrition specialist (dietitian) to adjust your eating plan to your individual calorie needs. This information is not intended to replace advice given to you by your health care provider. Make sure you discuss any questions you have with your health care provider. Document Revised: 06/12/2017 Document Reviewed: 06/23/2016 Elsevier Patient Education  [place].  Low-Sodium Eating Plan Sodium, which is an element that makes up salt, helps you maintain a healthy balance of fluids in your body. Too much sodium can increase your blood pressure and cause fluid and waste to be held in your body. Your health care  provider or dietitian may recommend following this plan if you have high blood pressure (hypertension), kidney disease, liver disease, or heart failure. Eating less sodium can help lower your blood pressure, reduce swelling, and protect your heart, liver, and kidneys. What are tips for following this plan? General guidelines  Most people on this plan should limit their sodium intake to 1,500-2,000 mg (milligrams) of sodium each day. Reading food labels   The Nutrition Facts label lists the amount of sodium in one serving of the food. If you eat more than one serving, you must multiply the listed amount of sodium by the number of servings.  Choose foods with less than 140 mg of sodium per serving.  Avoid foods with 300 mg of sodium or more per serving. Shopping  Look for lower-sodium products, often labeled as "low-sodium" or "no salt added."  Always check the sodium content even if foods are labeled as "unsalted" or "no salt added".  Buy fresh foods. ? Avoid canned foods and premade or frozen  meals. ? Avoid canned, cured, or processed meats  Buy breads that have less than 80 mg of sodium per slice. Cooking  Eat more home-cooked food and less restaurant, buffet, and fast food.  Avoid adding salt when cooking. Use salt-free seasonings or herbs instead of table salt or sea salt. Check with your health care provider or pharmacist before using salt substitutes.  Cook with plant-based oils, such as canola, sunflower, or olive oil. Meal planning  When eating at a restaurant, ask that your food be prepared with less salt or no salt, if possible.  Avoid foods that contain MSG (monosodium glutamate). MSG is sometimes added to [place] food, bouillon, and some canned foods. What foods are recommended? The items listed may not be a complete list. Talk with your dietitian about what dietary choices are best for you. Grains Low-sodium cereals, including oats, puffed wheat and rice, and shredded wheat. Low-sodium crackers. Unsalted rice. Unsalted pasta. Low-sodium bread. Whole-grain breads and whole-grain pasta. Vegetables Fresh or frozen vegetables. "No salt added" canned vegetables. "No salt added" tomato sauce and paste. Low-sodium or reduced-sodium tomato and vegetable juice. Fruits Fresh, frozen, or canned fruit. Fruit juice. Meats and other protein foods Fresh or frozen (no salt added) meat, poultry, seafood, and fish. Low-sodium canned tuna and salmon. Unsalted nuts. Dried peas, beans, and lentils without added salt. Unsalted canned beans. Eggs. Unsalted nut butters. Dairy Milk. Soy milk. Cheese that is naturally low in sodium, such as ricotta cheese, fresh mozzarella, or Swiss cheese Low-sodium or reduced-sodium cheese. Cream cheese. Yogurt. Fats and oils Unsalted butter. Unsalted margarine with no trans fat. Vegetable oils such as canola or olive oils. Seasonings and other foods Fresh and dried herbs and spices. Salt-free seasonings. Low-sodium mustard and ketchup. Sodium-free  salad dressing. Sodium-free light mayonnaise. Fresh or refrigerated horseradish. Lemon juice. Vinegar. Homemade, reduced-sodium, or low-sodium soups. Unsalted popcorn and pretzels. Low-salt or salt-free chips. What foods are not recommended? The items listed may not be a complete list. Talk with your dietitian about what dietary choices are best for you. Grains Instant hot cereals. Bread stuffing, pancake, and biscuit mixes. Croutons. Seasoned rice or pasta mixes. Noodle soup cups. Boxed or frozen macaroni and cheese. Regular salted crackers. Self-rising flour. Vegetables Sauerkraut, pickled vegetables, and relishes. Olives. [place] fries. Onion rings. Regular canned vegetables (not low-sodium or reduced-sodium). Regular canned tomato sauce and paste (not low-sodium or reduced-sodium). Regular tomato and vegetable juice (not low-sodium or reduced-sodium). Frozen vegetables in sauces.  Meats and other protein foods Meat or fish that is salted, canned, smoked, spiced, or pickled. Bacon, ham, sausage, hotdogs, corned beef, chipped beef, packaged lunch meats, salt pork, jerky, pickled herring, anchovies, regular canned tuna, sardines, salted nuts. Dairy Processed cheese and cheese spreads. Cheese curds. Blue cheese. Feta cheese. String cheese. Regular cottage cheese. Buttermilk. Canned milk. Fats and oils Salted butter. Regular margarine. Ghee. Bacon fat. Seasonings and other foods Onion salt, garlic salt, seasoned salt, table salt, and sea salt. Canned and packaged gravies. Worcestershire sauce. Tartar sauce. Barbecue sauce. Teriyaki sauce. Soy sauce, including reduced-sodium. Steak sauce. Fish sauce. Oyster sauce. Cocktail sauce. Horseradish that you find on the shelf. Regular ketchup and mustard. Meat flavorings and tenderizers. Bouillon cubes. Hot sauce and Tabasco sauce. Premade or packaged marinades. Premade or packaged taco seasonings. Relishes. Regular salad dressings. Salsa. Potato and tortilla  chips. Corn chips and puffs. Salted popcorn and pretzels. Canned or dried soups. Pizza. Frozen entrees and pot pies. Summary  Eating less sodium can help lower your blood pressure, reduce swelling, and protect your heart, liver, and kidneys.  Most people on this plan should limit their sodium intake to 1,500-2,000 mg (milligrams) of sodium each day.  Canned, boxed, and frozen foods are high in sodium. Restaurant foods, fast foods, and pizza are also very high in sodium. You also get sodium by adding salt to food.  Try to cook at home, eat more fresh fruits and vegetables, and eat less fast food, canned, processed, or prepared foods. This information is not intended to replace advice given to you by your health care provider. Make sure you discuss any questions you have with your health care provider. Document Revised: 06/12/2017 Document Reviewed: 06/23/2016 Elsevier Patient Education  [place].  Costochondritis  Costochondritis is swelling and irritation (inflammation) of the tissue (cartilage) that connects your ribs to your breastbone (sternum). This causes pain in the front of your chest. The pain usually starts gradually and involves more than one rib. What are the causes? The exact cause of this condition is not always known. It results from stress on the cartilage where your ribs attach to your sternum. The cause of this stress could be:  Chest injury (trauma).  Exercise or activity, such as lifting.  Severe coughing. What increases the risk? You may be at higher risk for this condition if you:  Are female.  Are 74?53 years old.  Recently started a new exercise or work activity.  Have low levels of vitamin D.  Have a condition that makes you cough frequently. What are the signs or symptoms? The main symptom of this condition is chest pain. The pain:  Usually starts gradually and can be sharp or dull.  Gets worse with deep breathing, coughing, or  exercise.  Gets better with rest.  May be worse when you press on the sternum-rib connection (tenderness). How is this diagnosed? This condition is diagnosed based on your symptoms, medical history, and a physical exam. Your health care provider will check for tenderness when pressing on your sternum. This is the most important finding. You may also have tests to rule out other causes of chest pain. These may include:  A chest X-ray to check for lung problems.  An electrocardiogram (ECG) to see if you have a heart problem that could be causing the pain.  An imaging scan to rule out a chest or rib fracture. How is this treated? This condition usually goes away on its own over time. Your  health care provider may prescribe an NSAID to reduce pain and inflammation. Your health care provider may also suggest that you:  Rest and avoid activities that make pain worse.  Apply heat or cold to the area to reduce pain and inflammation.  Do exercises to stretch your chest muscles. If these treatments do not help, your health care provider may inject a numbing medicine at the sternum-rib connection to help relieve the pain. Follow these instructions at home:  Avoid activities that make pain worse. This includes any activities that use chest, abdominal, and side muscles.  If directed, put ice on the painful area: ? Put ice in a plastic bag. ? Place a towel between your skin and the bag. ? Leave the ice on for 20 minutes, 2-3 times a day.  If directed, apply heat to the affected area as often as told by your health care provider. Use the heat source that your health care provider recommends, such as a moist heat pack or a heating pad. ? Place a towel between your skin and the heat source. ? Leave the heat on for 20-30 minutes. ? Remove the heat if your skin turns bright red. This is especially important if you are unable to feel pain, heat, or cold. You may have a greater risk of getting  burned.  Take over-the-counter and prescription medicines only as told by your health care provider.  Return to your normal activities as told by your health care provider. Ask your health care provider what activities are safe for you.  Keep all follow-up visits as told by your health care provider. This is important. Contact a health care provider if:  You have chills or a fever.  Your pain does not go away or it gets worse.  You have a cough that does not go away (is persistent). Get help right away if:  You have shortness of breath. This information is not intended to replace advice given to you by your health care provider. Make sure you discuss any questions you have with your health care provider. Document Revised: 07/15/2017 Document Reviewed: 10/24/2015 Elsevier Patient Education  2020 [employer].

## 2019-08-19 ENCOUNTER — Telehealth: Payer: Self-pay | Admitting: Internal Medicine

## 2019-08-19 NOTE — Telephone Encounter (Signed)
Pt called office today Pt called today to let Dr. Olivia Mackie know that her BP was 104/75 p70 and this was before she took her BP medication

## 2019-08-19 NOTE — Telephone Encounter (Signed)
Pt called today to let Dr. Olivia Mackie know that her BP was 104/75 p70 and this was before she took her BP medication

## 2019-08-19 NOTE — Telephone Encounter (Signed)
Called Pt back and she agreed to continue monitoring her BP daily.

## 2019-08-19 NOTE — Telephone Encounter (Signed)
Have pt continue to monitor BP

## 2019-09-03 DIAGNOSIS — J029 Acute pharyngitis, unspecified: Secondary | ICD-10-CM | POA: Diagnosis not present

## 2019-09-03 DIAGNOSIS — H60501 Unspecified acute noninfective otitis externa, right ear: Secondary | ICD-10-CM | POA: Diagnosis not present

## 2019-09-05 DIAGNOSIS — R0789 Other chest pain: Secondary | ICD-10-CM | POA: Insufficient documentation

## 2019-09-05 MED ORDER — EZETIMIBE 10 MG PO TABS: 10.0000 mg | ORAL_TABLET | Freq: Every day | ORAL | 3 refills | Status: DC

## 2019-09-13 ENCOUNTER — Other Ambulatory Visit: Payer: Self-pay

## 2019-09-13 ENCOUNTER — Inpatient Hospital Stay: Payer: Medicare Other | Attending: Oncology | Admitting: Oncology

## 2019-09-13 ENCOUNTER — Inpatient Hospital Stay: Payer: Medicare Other

## 2019-09-13 ENCOUNTER — Encounter: Payer: Self-pay | Admitting: Oncology

## 2019-09-13 DIAGNOSIS — H409 Unspecified glaucoma: Secondary | ICD-10-CM | POA: Diagnosis not present

## 2019-09-13 DIAGNOSIS — D72829 Elevated white blood cell count, unspecified: Secondary | ICD-10-CM | POA: Diagnosis not present

## 2019-09-13 DIAGNOSIS — E119 Type 2 diabetes mellitus without complications: Secondary | ICD-10-CM | POA: Diagnosis not present

## 2019-09-13 DIAGNOSIS — D75839 Thrombocytosis, unspecified: Secondary | ICD-10-CM

## 2019-09-13 DIAGNOSIS — E559 Vitamin D deficiency, unspecified: Secondary | ICD-10-CM | POA: Insufficient documentation

## 2019-09-13 DIAGNOSIS — K219 Gastro-esophageal reflux disease without esophagitis: Secondary | ICD-10-CM | POA: Insufficient documentation

## 2019-09-13 DIAGNOSIS — Z79899 Other long term (current) drug therapy: Secondary | ICD-10-CM | POA: Insufficient documentation

## 2019-09-13 DIAGNOSIS — G4700 Insomnia, unspecified: Secondary | ICD-10-CM | POA: Diagnosis not present

## 2019-09-13 DIAGNOSIS — Z955 Presence of coronary angioplasty implant and graft: Secondary | ICD-10-CM | POA: Diagnosis not present

## 2019-09-13 DIAGNOSIS — I10 Essential (primary) hypertension: Secondary | ICD-10-CM | POA: Insufficient documentation

## 2019-09-13 DIAGNOSIS — Z7982 Long term (current) use of aspirin: Secondary | ICD-10-CM | POA: Insufficient documentation

## 2019-09-13 DIAGNOSIS — F319 Bipolar disorder, unspecified: Secondary | ICD-10-CM | POA: Insufficient documentation

## 2019-09-13 DIAGNOSIS — J449 Chronic obstructive pulmonary disease, unspecified: Secondary | ICD-10-CM | POA: Insufficient documentation

## 2019-09-13 DIAGNOSIS — D473 Essential (hemorrhagic) thrombocythemia: Secondary | ICD-10-CM

## 2019-09-13 DIAGNOSIS — E785 Hyperlipidemia, unspecified: Secondary | ICD-10-CM | POA: Diagnosis not present

## 2019-09-13 DIAGNOSIS — F17210 Nicotine dependence, cigarettes, uncomplicated: Secondary | ICD-10-CM | POA: Diagnosis not present

## 2019-09-13 DIAGNOSIS — F419 Anxiety disorder, unspecified: Secondary | ICD-10-CM | POA: Insufficient documentation

## 2019-09-13 DIAGNOSIS — I2510 Atherosclerotic heart disease of native coronary artery without angina pectoris: Secondary | ICD-10-CM | POA: Insufficient documentation

## 2019-09-13 LAB — CBC WITH DIFFERENTIAL/PLATELET
Abs Immature Granulocytes: 0.05 10*3/uL (ref 0.00–0.07)
Basophils Absolute: 0.1 10*3/uL (ref 0.0–0.1)
Basophils Relative: 1 %
Eosinophils Absolute: 0.2 10*3/uL (ref 0.0–0.5)
Eosinophils Relative: 2 %
HCT: 36 % (ref 36.0–46.0)
Hemoglobin: 11.7 g/dL — ABNORMAL LOW (ref 12.0–15.0)
Immature Granulocytes: 0 %
Lymphocytes Relative: 34 %
Lymphs Abs: 4.1 10*3/uL — ABNORMAL HIGH (ref 0.7–4.0)
MCH: 29.2 pg (ref 26.0–34.0)
MCHC: 32.5 g/dL (ref 30.0–36.0)
MCV: 89.8 fL (ref 80.0–100.0)
Monocytes Absolute: 0.8 10*3/uL (ref 0.1–1.0)
Monocytes Relative: 7 %
Neutro Abs: 6.6 10*3/uL (ref 1.7–7.7)
Neutrophils Relative %: 56 %
Platelets: 370 10*3/uL (ref 150–400)
RBC: 4.01 MIL/uL (ref 3.87–5.11)
RDW: 13.2 % (ref 11.5–15.5)
WBC: 11.8 10*3/uL — ABNORMAL HIGH (ref 4.0–10.5)
nRBC: 0 % (ref 0.0–0.2)

## 2019-09-13 LAB — IRON AND TIBC
Iron: 63 ug/dL (ref 28–170)
Saturation Ratios: 17 % (ref 10.4–31.8)
TIBC: 382 ug/dL (ref 250–450)
UIBC: 319 ug/dL

## 2019-09-13 LAB — TECHNOLOGIST SMEAR REVIEW
Plt Morphology: NORMAL
RBC Morphology: NORMAL

## 2019-09-13 LAB — FERRITIN: Ferritin: 70 ng/mL (ref 11–307)

## 2019-09-13 NOTE — Progress Notes (Signed)
Patient is here today to establish care for thrombocytosis and leukocytosis.

## 2019-09-14 ENCOUNTER — Encounter: Payer: Self-pay | Admitting: Oncology

## 2019-09-14 ENCOUNTER — Ambulatory Visit (INDEPENDENT_AMBULATORY_CARE_PROVIDER_SITE_OTHER): Payer: Medicare Other | Admitting: Gastroenterology

## 2019-09-14 ENCOUNTER — Other Ambulatory Visit: Payer: Self-pay

## 2019-09-14 VITALS — BP 142/77 | HR 79 | Temp 98.2°F | Ht 63.0 in | Wt 187.4 lb

## 2019-09-14 DIAGNOSIS — R140 Abdominal distension (gaseous): Secondary | ICD-10-CM

## 2019-09-14 NOTE — Progress Notes (Signed)
Hematology/Oncology Consult note [facility] Telephone:([phone] [phone]  Patient Care Team: McLean-Scocuzza, Nino Glow, MD as PCP - General (Internal Medicine) End, Harrell Gave, MD as PCP - Cardiology (Cardiology)   Name of the patient: Shawna Anderson  YR:[id number]  [birth year]    Reason for referral-leukocytosis/thrombocytosis   Referring physician-Dr. Mclean-Scocuzza  Date of visit: 09/14/19   History of presenting illness-patient is a 54 year old female with a past medical history significant for a s/p stent placement, COPD, GERD referred to me for leukocytosis/thrombocytosis.Looking back at her CBCs patient has had chronic leukocytosis at least dating back to 2016 when her white cell count fluctuates widely between 11-20.  Differential has mostly show neutrophilia but at times has shown lymphocytosis and monocytosis.  Hemoglobin is always been normal but most recently patient was noted to have thrombocytosis as well.  Her most recent CBC from 07/18/2019 showed white cell count of 17, H&H of 13.7/40.8 and a platelet count of 550.  Patient admits to smoking daily about half a pack per day.  Also admits to smoking marijuana occasionally.  She also currently complains of symptoms of heartburn and chronic left sided abdominal pain for which she is seeing GI soon.  Reports that her appetite and weight have remained stable.  ECOG PS- 1  Pain scale- 0   Review of systems- Review of Systems  Constitutional: Positive for malaise/fatigue. Negative for chills, fever and weight loss.  HENT: Negative for congestion, ear discharge and nosebleeds.   Eyes: Negative for blurred vision.  Respiratory: Negative for cough, hemoptysis, sputum production, shortness of breath and wheezing.   Cardiovascular: Negative for chest pain, palpitations, orthopnea and claudication.  Gastrointestinal: Positive for abdominal pain and heartburn. Negative for blood in stool,  constipation, diarrhea, melena, nausea and vomiting.  Genitourinary: Negative for dysuria, flank pain, frequency, hematuria and urgency.  Musculoskeletal: Negative for back pain, joint pain and myalgias.  Skin: Negative for rash.  Neurological: Negative for dizziness, tingling, focal weakness, seizures, weakness and headaches.  Endo/Heme/Allergies: Does not bruise/bleed easily.  Psychiatric/Behavioral: Negative for depression and suicidal ideas. The patient does not have insomnia.     Allergies  Allergen Reactions  . Ativan [Lorazepam]     Pt states it makes her tongue do weird things   . Latex Other (See Comments)    Patient stated that she was told by her doctor that she is "allergic to" this  . Tape Other (See Comments)    Patient stated that she was told by her doctor that she is "allergic to" this  . Drixoral Allergy Sinus [Dexbromphen-Pse-Apap Er] Rash    Patient Active Problem List   Diagnosis Date Noted  . Chest wall pain 09/05/2019  . Poor dentition 08/18/2019  . Thrombocytosis ([place]) 08/18/2019  . Leukocytosis 08/18/2019  . Anal lesion 08/18/2019  . Female pelvic pain 05/26/2019  . Colitis 05/26/2019  . HLD (hyperlipidemia) 12/27/2018  . Vitamin D deficiency 12/24/2018  . Bipolar disorder ([place]) 12/30/2017  . OSA (obstructive sleep apnea) 12/30/2017  . Constipation 12/30/2017  . Coronary artery disease of native artery of native heart with stable angina pectoris ([place]) 12/30/2017  . Gastroesophageal reflux disease 12/30/2017  . Asthma 12/29/2017  . COPD (chronic obstructive pulmonary disease) ([place]) 12/29/2017  . Anxiety and depression 12/29/2017  . Insomnia 12/29/2017  . Chronic back pain 12/29/2017  . Skin lesion of back 12/29/2017  . CAD S/P CFX PCI 2010 04/15/2016  . Essential hypertension 04/15/2016  . Noncompliance with medication regimen 04/15/2016  .  Chest pain with moderate risk of acute coronary syndrome 04/12/2016  . Abdominal pain 01/12/2015  .  Vomiting 01/12/2015  . Nausea and vomiting 01/12/2015     Past Medical History:  Diagnosis Date  . Anxiety   . Asthma   . Bipolar 1 disorder ([place])   . Bipolar disorder ([place])   . CAD (coronary artery disease)    s/p stent BMS OM Cx  . Cervical herniated disc 04/12/2016  . COPD (chronic obstructive pulmonary disease) ([place])   . Depression   . Diabetes mellitus without complication ([place])   . Diverticulitis   . GERD (gastroesophageal reflux disease)   . Glaucoma   . History of blood transfusion   . Hyperlipidemia   . Hypertension   . OSA (obstructive sleep apnea)    not using cpap   . Plantar fasciitis    b/l feet s/p steroid shots w/o help and left surgery w/o help   . UTI (urinary tract infection)      Past Surgical History:  Procedure Laterality Date  . ABDOMINAL HYSTERECTOMY    . ABDOMINAL SURGERY  1995   Bowel resection.  Marland Kitchen CARDIAC CATHETERIZATION N/A 04/14/2016   Procedure: Left Heart Cath and Coronary Angiography;  Surgeon: Burnell Blanks, MD;  Location: [facility] LAB;  Service: Cardiovascular;  Laterality: N/A;  . CARDIAC SURGERY    . COLONOSCOPY WITH PROPOFOL N/A 07/11/2019   Procedure: COLONOSCOPY WITH PROPOFOL;  Surgeon: Jonathon Bellows, MD;  Location: [facility] ENDOSCOPY;  Service: Gastroenterology;  Laterality: N/A;  . COLONOSCOPY WITH PROPOFOL N/A 07/29/2019   Procedure: COLONOSCOPY WITH PROPOFOL;  Surgeon: Jonathon Bellows, MD;  Location: [facility] ENDOSCOPY;  Service: Gastroenterology;  Laterality: N/A;  . CORONARY ANGIOPLASTY WITH STENT PLACEMENT  2010   Drug eluting stent  . ESOPHAGOGASTRODUODENOSCOPY (EGD) WITH PROPOFOL N/A 07/11/2019   Procedure: ESOPHAGOGASTRODUODENOSCOPY (EGD) WITH PROPOFOL;  Surgeon: Jonathon Bellows, MD;  Location: [facility] ENDOSCOPY;  Service: Gastroenterology;  Laterality: N/A;  . OVARIAN CYST REMOVAL      Social History   Socioeconomic History  . Marital status: Single    Spouse name: Not on file  . Number of children: Not on file  . Years  of education: Not on file  . Highest education level: Not on file  Occupational History  . Not on file  Tobacco Use  . Smoking status: Current Every Day Smoker    Packs/day: 0.50    Years: 30.00    Pack years: 15.00    Types: Cigarettes  . Smokeless tobacco: Never Used  . Tobacco comment: refused  Substance and Sexual Activity  . Alcohol use: Not Currently    Comment: once a year  . Drug use: Yes    Frequency: 7.0 times per week    Types: Marijuana  . Sexual activity: Not on file  Other Topics Concern  . Not on file  Social History Narrative   From [place] now living in [place]    1 son    No guns    Wears seat belt   Safe in relationship    Social Determinants of Health   Financial Resource Strain:   . Difficulty of Paying Living Expenses: Not on file  Food Insecurity:   . Worried About Charity fundraiser in the Last Year: Not on file  . Ran Out of Food in the Last Year: Not on file  Transportation Needs:   . Lack of Transportation (Medical): Not on file  . Lack of Transportation (  Non-Medical): Not on file  Physical Activity:   . Days of Exercise per Week: Not on file  . Minutes of Exercise per Session: Not on file  Stress:   . Feeling of Stress : Not on file  Social Connections:   . Frequency of Communication with Friends and Family: Not on file  . Frequency of Social Gatherings with Friends and Family: Not on file  . Attends Religious Services: Not on file  . Active Member of Clubs or Organizations: Not on file  . Attends Archivist Meetings: Not on file  . Marital Status: Not on file  Intimate Partner Violence:   . Fear of Current or Ex-Partner: Not on file  . Emotionally Abused: Not on file  . Physically Abused: Not on file  . Sexually Abused: Not on file     Family History  Problem Relation Age of Onset  . CAD Mother   . Depression Mother   . Heart disease Mother   . Hyperlipidemia Mother   . Hypertension Mother   . CAD  Brother   . Depression Brother   . Diabetes Brother   . Heart disease Brother   . Hyperlipidemia Brother   . Heart disease Father   . Alcohol abuse Father      Current Outpatient Medications:  .  Albuterol Sulfate (PROAIR RESPICLICK) 123XX123 (90 Base) MCG/ACT AEPB, Inhale 1 Inhaler into the lungs every 4 (four) hours as needed., Disp: 3 each, Rfl: 4 .  ALPRAZolam (XANAX) 0.25 MG tablet, Take 1 tablet (0.25 mg total) by mouth at bedtime as needed for anxiety or sleep., Disp: 30 tablet, Rfl: 2 .  amLODipine (NORVASC) 10 MG tablet, Take 1 tablet (10 mg total) by mouth at bedtime., Disp: 90 tablet, Rfl: 3 .  aspirin EC 81 MG tablet, Take 1 tablet (81 mg total) by mouth daily., Disp: 90 tablet, Rfl: 3 .  atorvastatin (LIPITOR) 80 MG tablet, Take 1 tablet (80 mg total) by mouth daily at 6 PM., Disp: 90 tablet, Rfl: 3 .  clopidogrel (PLAVIX) 75 MG tablet, Take 1 tablet (75 mg total) by mouth daily., Disp: 90 tablet, Rfl: 3 .  cyclobenzaprine (FLEXERIL) 5 MG tablet, Take 1 tablet (5 mg total) by mouth at bedtime as needed for muscle spasms., Disp: 30 tablet, Rfl: 0 .  dicyclomine (BENTYL) 10 MG capsule, Take 1 capsule (10 mg total) by mouth 4 (four) times daily -  before meals and at bedtime., Disp: 120 capsule, Rfl: 2 .  divalproex (DEPAKOTE) 500 MG DR tablet, Take 1 tablet (500 mg total) by mouth 2 (two) times daily., Disp: 180 tablet, Rfl: 1 .  ezetimibe (ZETIA) 10 MG tablet, Take 1 tablet (10 mg total) by mouth daily. With lipitor 80 mg at night, Disp: 90 tablet, Rfl: 3 .  hyoscyamine (LEVSIN) 0.125 MG tablet, Take 1 tablet (0.125 mg total) by mouth every 4 (four) hours. As needed, Disp: 180 tablet, Rfl: 5 .  ipratropium-albuterol (DUONEB) 0.5-2.5 (3) MG/3ML SOLN, Take 3 mLs by nebulization every 6 (six) hours as needed., Disp: 360 mL, Rfl: 11 .  isosorbide mononitrate (IMDUR) 30 MG 24 hr tablet, Take 1 tablet (30 mg total) by mouth daily., Disp: 90 tablet, Rfl: 0 .  lidocaine (LIDODERM) 5 %, Place 1  patch onto the skin daily. Remove & Discard patch within 12 hours or as directed by MD, Disp: 30 patch, Rfl: 0 .  nitroGLYCERIN (NITROSTAT) 0.4 MG SL tablet, Place 1 tablet (0.4 mg total) under  the tongue every 5 (five) minutes as needed for chest pain. X 2 call 911 after 3rd dose, Disp: 30 tablet, Rfl: 5 .  olmesartan-hydrochlorothiazide (BENICAR HCT) 40-25 MG tablet, Take 1 tablet by mouth daily., Disp: 90 tablet, Rfl: 3 .  ondansetron (ZOFRAN ODT) 4 MG disintegrating tablet, Take 1 tablet (4 mg total) by mouth every 8 (eight) hours as needed for nausea or vomiting., Disp: 12 tablet, Rfl: 0 .  pantoprazole (PROTONIX) 40 MG tablet, Take 1 tablet (40 mg total) by mouth 2 (two) times daily. In am before food, Disp: 60 tablet, Rfl: 5 .  senna-docusate (SENOKOT-S) 8.6-50 MG tablet, Take 1-2 tablets by mouth daily as needed for mild constipation., Disp: 90 tablet, Rfl: 1 .  traZODone (DESYREL) 150 MG tablet, Take 1 tablet (150 mg total) by mouth at bedtime as needed for sleep., Disp: 90 tablet, Rfl: 3 .  albuterol (PROVENTIL) (2.5 MG/3ML) 0.083% nebulizer solution, Take 3 mLs (2.5 mg total) by nebulization every 6 (six) hours as needed for wheezing or shortness of breath. (Patient not taking: Reported on 09/13/2019), Disp: 360 mL, Rfl: 12   Physical exam: There were no vitals filed for this visit. Physical Exam HENT:     Head: Normocephalic and atraumatic.  Eyes:     Pupils: Pupils are equal, round, and reactive to light.  Cardiovascular:     Rate and Rhythm: Normal rate and regular rhythm.     Heart sounds: Normal heart sounds.  Pulmonary:     Effort: Pulmonary effort is normal.     Breath sounds: Normal breath sounds.  Abdominal:     General: Bowel sounds are normal. There is no distension.     Palpations: Abdomen is soft.     Tenderness: There is no abdominal tenderness.     Comments: No palpable hepatosplenomegaly  Musculoskeletal:     Cervical back: Normal range of motion.    Lymphadenopathy:     Comments: No palpable cervical, supraclavicular, axillary or inguinal adenopathy   Skin:    General: Skin is warm and dry.  Neurological:     Mental Status: She is alert and oriented to person, place, and time.        CMP Latest Ref Rng & Units 07/18/2019  Glucose 70 - 99 mg/dL 150(H)  BUN 6 - 20 mg/dL 18  Creatinine 0.44 - 1.00 mg/dL 0.92  Sodium 135 - 145 mmol/L 139  Potassium 3.5 - 5.1 mmol/L 3.8  Chloride 98 - 111 mmol/L 102  CO2 22 - 32 mmol/L 22  Calcium 8.9 - 10.3 mg/dL 9.5  Total Protein 6.5 - 8.1 g/dL 7.8  Total Bilirubin 0.3 - 1.2 mg/dL 0.7  Alkaline Phos 38 - 126 U/L 89  AST 15 - 41 U/L 24  ALT 0 - 44 U/L 22   CBC Latest Ref Rng & Units 09/13/2019  WBC 4.0 - 10.5 K/uL 11.8(H)  Hemoglobin 12.0 - 15.0 g/dL 11.7(L)  Hematocrit 36.0 - 46.0 % 36.0  Platelets 150 - 400 K/uL 370     Assessment and plan- Patient is a 54 y.o. female referred for leukocytosis/thrombocytosis  Patient has had longstanding leukocytosis at least dating back to last years which is remained mostly stable but fluctuates between 11-20.  Differential mainly shows neutrophilia and at times lymphocytosis.  More recently patient also noted to have thrombocytosis.  I suspect this is all reactive secondary to her underlying smoking and marijuana use.  However I would like to do some tests as below to  rule out primary myeloproliferative process such as CML or CLL.  Today I will check a CBC with differential, smear review, peripheral flow cytometry, BCR ABL FISH testing, JAK2 mutation testing as well as ferritin and iron studies.  I will see her back in 2 weeks for a video visit   Thank you for this kind referral and the opportunity to participate in the care of this  Patient   Visit Diagnosis 1. Leukocytosis, unspecified type   2. Thrombocytosis ([place])     Dr. Randa Evens, MD, MPH [facility] [id number] 09/14/2019

## 2019-09-14 NOTE — Progress Notes (Signed)
Jonathon Bellows MD, MRCP(U.K) [address]  [place]  [place], [place] [address]  Main: [phone]  Fax: [phone]   Primary Care Physician: McLean-Scocuzza, Nino Glow, MD  Primary Gastroenterologist:  Dr. Jonathon Bellows   No chief complaint on file.   HPI: Shawna Anderson is a 54 y.o. female   Summary of history :  Initially seen and referred in November 2020 for GERD.    GERD> 10 years.  Gained  weight recently.  At initial visit she was on Protonix once a day which was not helping her.  Symptoms of heartburn regurgitation.She says that she has had rupture of her intestines at the age of 40 possibly due to diverticulitis.  Initial visit she had pain in the left lower quadrant which has been ongoing since visit to ER in November 2020.  At that time she was given a course of antibiotics as there is inflammation seen on the CT scan at her distal colonic anastomosis.  History of partial colectomy.  Hysterectomy showing an indeterminant left adnexal soft tissue focus adjacent to perianastomotic thick-walled blind ending distal colonic limb.  Left ovarian mass cannot be excluded entirely.    05/20/2019: Ultrasound pelvis: Collection with a shadowing calcification seen in the left adnexa adjacent to the pelvic sidewall correspondents well to a collection seen on previous CT.  Possible postsurgical or lesion of ovarian adnexal origin.  Recommended to follow-up CT or MRI in 1 to 2 months.  07/11/2019: EGD: Nonobstructive Schatzki's ring seen inflammation seen at the antrum of the stomach, biopsies showed no significant abnormalities.  Colonoscopy: Patient was unable to be sedated and hence procedure could not be performed she has had issues with sedation in the past.  07/18/2019: Came into the emergency room with abdominal pain.  CT scan of the abdomen was repeated showed no significant abnormality.  Hemoglobin 13.7 g.  White cell count of 17.  CMP normal except an elevated glucose of 150.   Lipase normal.  Interval history   07/20/2019-09/14/2019   07/29/2019: Colonoscopy: 5 mm polyp in the distal ascending colon resected otherwise normal colonoscopy pathology of the polyp suggests mucosal tearing ectasia and is benign.  At her last visit I increased her Protonix to twice a day.  She has been taking activated charcoal tablets daily.  Helped a bit but still has significant abdominal bloating.  She states that she has had this since she was a child.  She continues to smoke marijuana.  Has nausea in the mornings at times.  She has not tried to cut down on marijuana smoking.  She has tried the low FODMAP diet.  Current Outpatient Medications  Medication Sig Dispense Refill  . albuterol (PROVENTIL) (2.5 MG/3ML) 0.083% nebulizer solution Take 3 mLs (2.5 mg total) by nebulization every 6 (six) hours as needed for wheezing or shortness of breath. (Patient not taking: Reported on 09/13/2019) 360 mL 12  . Albuterol Sulfate (PROAIR RESPICLICK) 123XX123 (90 Base) MCG/ACT AEPB Inhale 1 Inhaler into the lungs every 4 (four) hours as needed. 3 each 4  . ALPRAZolam (XANAX) 0.25 MG tablet Take 1 tablet (0.25 mg total) by mouth at bedtime as needed for anxiety or sleep. 30 tablet 2  . amLODipine (NORVASC) 10 MG tablet Take 1 tablet (10 mg total) by mouth at bedtime. 90 tablet 3  . aspirin EC 81 MG tablet Take 1 tablet (81 mg total) by mouth daily. 90 tablet 3  . atorvastatin (LIPITOR) 80 MG tablet Take 1 tablet (  80 mg total) by mouth daily at 6 PM. 90 tablet 3  . clopidogrel (PLAVIX) 75 MG tablet Take 1 tablet (75 mg total) by mouth daily. 90 tablet 3  . cyclobenzaprine (FLEXERIL) 5 MG tablet Take 1 tablet (5 mg total) by mouth at bedtime as needed for muscle spasms. 30 tablet 0  . dicyclomine (BENTYL) 10 MG capsule Take 1 capsule (10 mg total) by mouth 4 (four) times daily -  before meals and at bedtime. 120 capsule 2  . divalproex (DEPAKOTE) 500 MG DR tablet Take 1 tablet (500 mg total) by mouth 2 (two)  times daily. 180 tablet 1  . ezetimibe (ZETIA) 10 MG tablet Take 1 tablet (10 mg total) by mouth daily. With lipitor 80 mg at night 90 tablet 3  . hyoscyamine (LEVSIN) 0.125 MG tablet Take 1 tablet (0.125 mg total) by mouth every 4 (four) hours. As needed 180 tablet 5  . ipratropium-albuterol (DUONEB) 0.5-2.5 (3) MG/3ML SOLN Take 3 mLs by nebulization every 6 (six) hours as needed. 360 mL 11  . isosorbide mononitrate (IMDUR) 30 MG 24 hr tablet Take 1 tablet (30 mg total) by mouth daily. 90 tablet 0  . lidocaine (LIDODERM) 5 % Place 1 patch onto the skin daily. Remove & Discard patch within 12 hours or as directed by MD 30 patch 0  . nitroGLYCERIN (NITROSTAT) 0.4 MG SL tablet Place 1 tablet (0.4 mg total) under the tongue every 5 (five) minutes as needed for chest pain. X 2 call 911 after 3rd dose 30 tablet 5  . olmesartan-hydrochlorothiazide (BENICAR HCT) 40-25 MG tablet Take 1 tablet by mouth daily. 90 tablet 3  . ondansetron (ZOFRAN ODT) 4 MG disintegrating tablet Take 1 tablet (4 mg total) by mouth every 8 (eight) hours as needed for nausea or vomiting. 12 tablet 0  . pantoprazole (PROTONIX) 40 MG tablet Take 1 tablet (40 mg total) by mouth 2 (two) times daily. In am before food 60 tablet 5  . senna-docusate (SENOKOT-S) 8.6-50 MG tablet Take 1-2 tablets by mouth daily as needed for mild constipation. 90 tablet 1  . traZODone (DESYREL) 150 MG tablet Take 1 tablet (150 mg total) by mouth at bedtime as needed for sleep. 90 tablet 3   No current facility-administered medications for this visit.    Allergies as of 09/14/2019 - Review Complete 09/13/2019  Allergen Reaction Noted  . Ativan [lorazepam]  11/14/2017  . Latex Other (See Comments) 02/12/2017  . Tape Other (See Comments) 02/12/2017  . Drixoral allergy sinus [dexbromphen-pse-apap er] Rash 01/12/2015    ROS:  General: Negative for anorexia, weight loss, fever, chills, fatigue, weakness. ENT: Negative for hoarseness, difficulty  swallowing , nasal congestion. CV: Negative for chest pain, angina, palpitations, dyspnea on exertion, peripheral edema.  Respiratory: Negative for dyspnea at rest, dyspnea on exertion, cough, sputum, wheezing.  GI: See history of present illness. GU:  Negative for dysuria, hematuria, urinary incontinence, urinary frequency, nocturnal urination.  Endo: Negative for unusual weight change.    Physical Examination:   There were no vitals taken for this visit.  General: Well-nourished, well-developed in no acute distress.  Eyes: No icterus. Conjunctivae pink. Psych: Alert and cooperative, normal mood and affect.   Imaging Studies: No results found.  Assessment and Plan:   Shawna Anderson is a 54 y.o. y/o female here to follow-up for bloating and abdominal distention which could be due to adhesions from her prior abdominal surgery.  She has had this issues for many years  she states.  I have also suggested she stop smoking marijuana as it can also lead to aerophagia and trapping of gas.    She has got minimal relief from a low FODMAP diet and increasing the PPI.  I suggested most likely the cause is from aerophagia and smoking of marijuana.  There is also possibility she may have small bowel bacterial overgrowth syndrome.  I strongly suggest her to stop smoking marijuana if she can but she states it is almost impossible.  Since we have no other option I will give her a course of 7 days of Xifaxan from samples that we have to empirically treat SIBO.  I will give her a call back in 2 to 4 weeks to check how she is doing. Dr Jonathon Bellows  MD,MRCP [facility])

## 2019-09-19 LAB — COMP PANEL: LEUKEMIA/LYMPHOMA

## 2019-09-20 LAB — BCR-ABL1 FISH
Cells Analyzed: 200
Cells Counted: 200

## 2019-09-21 ENCOUNTER — Emergency Department
Admission: EM | Admit: 2019-09-21 | Discharge: 2019-09-21 | Disposition: A | Discharge status: Home / Self Care | Payer: Medicare Other | Attending: Emergency Medicine | Admitting: Emergency Medicine

## 2019-09-21 ENCOUNTER — Other Ambulatory Visit: Payer: Self-pay

## 2019-09-21 ENCOUNTER — Encounter: Payer: Self-pay | Admitting: Medical Oncology

## 2019-09-21 ENCOUNTER — Emergency Department: Payer: Medicare Other

## 2019-09-21 DIAGNOSIS — J449 Chronic obstructive pulmonary disease, unspecified: Secondary | ICD-10-CM | POA: Insufficient documentation

## 2019-09-21 DIAGNOSIS — Z79899 Other long term (current) drug therapy: Secondary | ICD-10-CM | POA: Diagnosis not present

## 2019-09-21 DIAGNOSIS — E119 Type 2 diabetes mellitus without complications: Secondary | ICD-10-CM | POA: Insufficient documentation

## 2019-09-21 DIAGNOSIS — I2510 Atherosclerotic heart disease of native coronary artery without angina pectoris: Secondary | ICD-10-CM | POA: Insufficient documentation

## 2019-09-21 DIAGNOSIS — Z955 Presence of coronary angioplasty implant and graft: Secondary | ICD-10-CM | POA: Diagnosis not present

## 2019-09-21 DIAGNOSIS — Z7901 Long term (current) use of anticoagulants: Secondary | ICD-10-CM | POA: Diagnosis not present

## 2019-09-21 DIAGNOSIS — R1110 Vomiting, unspecified: Secondary | ICD-10-CM | POA: Diagnosis not present

## 2019-09-21 DIAGNOSIS — J45909 Unspecified asthma, uncomplicated: Secondary | ICD-10-CM | POA: Diagnosis not present

## 2019-09-21 DIAGNOSIS — R000 Tachycardia, unspecified: Secondary | ICD-10-CM | POA: Diagnosis not present

## 2019-09-21 DIAGNOSIS — R109 Unspecified abdominal pain: Secondary | ICD-10-CM | POA: Diagnosis not present

## 2019-09-21 DIAGNOSIS — F17210 Nicotine dependence, cigarettes, uncomplicated: Secondary | ICD-10-CM | POA: Insufficient documentation

## 2019-09-21 DIAGNOSIS — I10 Essential (primary) hypertension: Secondary | ICD-10-CM | POA: Insufficient documentation

## 2019-09-21 DIAGNOSIS — Z7982 Long term (current) use of aspirin: Secondary | ICD-10-CM | POA: Insufficient documentation

## 2019-09-21 DIAGNOSIS — R112 Nausea with vomiting, unspecified: Secondary | ICD-10-CM | POA: Diagnosis not present

## 2019-09-21 LAB — COMPREHENSIVE METABOLIC PANEL
ALT: 146 U/L — ABNORMAL HIGH (ref 0–44)
AST: 122 U/L — ABNORMAL HIGH (ref 15–41)
Albumin: 4.7 g/dL (ref 3.5–5.0)
Alkaline Phosphatase: 85 U/L (ref 38–126)
Anion gap: 18 — ABNORMAL HIGH (ref 5–15)
BUN: 40 mg/dL — ABNORMAL HIGH (ref 6–20)
CO2: 24 mmol/L (ref 22–32)
Calcium: 9.6 mg/dL (ref 8.9–10.3)
Chloride: 95 mmol/L — ABNORMAL LOW (ref 98–111)
Creatinine, Ser: 1.07 mg/dL — ABNORMAL HIGH (ref 0.44–1.00)
GFR calc Af Amer: >60 mL/min (ref >60)
GFR calc non Af Amer: 60 mL/min — ABNORMAL LOW (ref >60)
Glucose, Bld: 185 mg/dL — ABNORMAL HIGH (ref 70–99)
Potassium: 3.2 mmol/L — ABNORMAL LOW (ref 3.5–5.1)
Sodium: 137 mmol/L (ref 135–145)
Total Bilirubin: 0.6 mg/dL (ref 0.3–1.2)
Total Protein: 8.1 g/dL (ref 6.5–8.1)

## 2019-09-21 LAB — CBC
HCT: 39.7 % (ref 36.0–46.0)
Hemoglobin: 13.7 g/dL (ref 12.0–15.0)
MCH: 29.7 pg (ref 26.0–34.0)
MCHC: 34.5 g/dL (ref 30.0–36.0)
MCV: 85.9 fL (ref 80.0–100.0)
Platelets: 632 10*3/uL — ABNORMAL HIGH (ref 150–400)
RBC: 4.62 MIL/uL (ref 3.87–5.11)
RDW: 13.2 % (ref 11.5–15.5)
WBC: 19.3 10*3/uL — ABNORMAL HIGH (ref 4.0–10.5)
nRBC: 0 % (ref 0.0–0.2)

## 2019-09-21 LAB — LIPASE, BLOOD: Lipase: 67 U/L — ABNORMAL HIGH (ref 11–51)

## 2019-09-21 MED ORDER — DICYCLOMINE HCL 10 MG PO CAPS
10.0000 mg | ORAL_CAPSULE | Freq: Once | ORAL | Status: AC
  Administered 2019-09-21: 10 mg via ORAL
  Filled 2019-09-21: qty 1

## 2019-09-21 MED ORDER — SODIUM CHLORIDE 0.9 % IV BOLUS
1000.0000 mL | Freq: Once | INTRAVENOUS | Status: AC
  Administered 2019-09-21: 1000 mL via INTRAVENOUS

## 2019-09-21 MED ORDER — ONDANSETRON HCL 4 MG/2ML IJ SOLN
INTRAMUSCULAR | Status: AC
  Administered 2019-09-21: 4 mg via INTRAVENOUS
  Filled 2019-09-21: qty 2

## 2019-09-21 MED ORDER — ALUM & MAG HYDROXIDE-SIMETH 200-200-20 MG/5ML PO SUSP
15.0000 mL | Freq: Once | ORAL | Status: AC
  Administered 2019-09-21: 15 mL via ORAL
  Filled 2019-09-21: qty 30

## 2019-09-21 MED ORDER — HYDROMORPHONE HCL 1 MG/ML IJ SOLN
0.5000 mg | INTRAMUSCULAR | Status: AC
  Administered 2019-09-21: 0.5 mg via INTRAVENOUS
  Filled 2019-09-21: qty 1

## 2019-09-21 MED ORDER — HYDROMORPHONE HCL 1 MG/ML IJ SOLN: 1.0000 mg | INTRAMUSCULAR | Status: AC

## 2019-09-21 MED ORDER — PROMETHAZINE HCL 25 MG RE SUPP: 25.0000 mg | Freq: Four times a day (QID) | RECTAL | 1 refills | Status: DC | PRN

## 2019-09-21 MED ORDER — HYDROMORPHONE HCL 1 MG/ML IJ SOLN
INTRAMUSCULAR | Status: AC
  Administered 2019-09-21: 1 mg via INTRAVENOUS
  Filled 2019-09-21: qty 1

## 2019-09-21 MED ORDER — IOHEXOL 300 MG/ML  SOLN
100.0000 mL | Freq: Once | INTRAMUSCULAR | Status: AC | PRN
  Administered 2019-09-21: 100 mL via INTRAVENOUS

## 2019-09-21 MED ORDER — ONDANSETRON HCL 4 MG/2ML IJ SOLN
4.0000 mg | Freq: Once | INTRAMUSCULAR | Status: AC
  Administered 2019-09-21: 4 mg via INTRAVENOUS
  Filled 2019-09-21: qty 2

## 2019-09-21 MED ORDER — DICYCLOMINE HCL 10 MG PO CAPS: 10.0000 mg | ORAL_CAPSULE | Freq: Four times a day (QID) | ORAL | 0 refills | Status: DC

## 2019-09-21 MED ORDER — ONDANSETRON HCL 4 MG/2ML IJ SOLN: 4.0000 mg | Freq: Once | INTRAMUSCULAR | Status: AC

## 2019-09-21 NOTE — ED Provider Notes (Signed)
[facility] Emergency Department Provider Note   ____________________________________________   First MD Initiated Contact with Patient 09/21/19 0930     (approximate)  I have reviewed the triage vital signs and the nursing notes.   HISTORY  Chief Complaint Emesis and Abdominal Pain  EM caveat: Poor historian, patient frequently referring me to "check the computer" when asked questions  HPI Shawna Anderson is a 54 y.o. female history of diabetes, colitis, coronary disease, COPD  Patient reports for 2 days now she has had severe retching, dry heaving, left-sided abdominal pain.  She reports she has episodes like this will happen frequently.  She seen a GI doctor Dr. Vicente Males for it.  Not sure what causes it.  Reports a distant history of a bowel resection though.  Reports she has been vomiting, dry heaving.  Reports that she often needs to get nausea and pain medicine to calm the episodes.  Last similar episode happened in January  Often when I asked her questions she will say things such as "just check your computer"  Denies chest pain.  Denies Covid exposure.  Denies fevers or chills.  Reports she was in her normal health until 2 days ago and started as nausea and vomiting.  Past Medical History:  Diagnosis Date  . Anxiety   . Asthma   . Bipolar 1 disorder ([place])   . Bipolar disorder ([place])   . CAD (coronary artery disease)    s/p stent BMS OM Cx  . Cervical herniated disc 04/12/2016  . COPD (chronic obstructive pulmonary disease) ([place])   . Depression   . Diabetes mellitus without complication ([place])   . Diverticulitis   . GERD (gastroesophageal reflux disease)   . Glaucoma   . History of blood transfusion   . Hyperlipidemia   . Hypertension   . OSA (obstructive sleep apnea)    not using cpap   . Plantar fasciitis    b/l feet s/p steroid shots w/o help and left surgery w/o help   . UTI (urinary tract infection)     Patient Active Problem  List   Diagnosis Date Noted  . Chest wall pain 09/05/2019  . Poor dentition 08/18/2019  . Thrombocytosis ([place]) 08/18/2019  . Leukocytosis 08/18/2019  . Anal lesion 08/18/2019  . Female pelvic pain 05/26/2019  . Colitis 05/26/2019  . HLD (hyperlipidemia) 12/27/2018  . Vitamin D deficiency 12/24/2018  . Bipolar disorder ([place]) 12/30/2017  . OSA (obstructive sleep apnea) 12/30/2017  . Constipation 12/30/2017  . Coronary artery disease of native artery of native heart with stable angina pectoris ([place]) 12/30/2017  . Gastroesophageal reflux disease 12/30/2017  . Asthma 12/29/2017  . COPD (chronic obstructive pulmonary disease) ([place]) 12/29/2017  . Anxiety and depression 12/29/2017  . Insomnia 12/29/2017  . Chronic back pain 12/29/2017  . Skin lesion of back 12/29/2017  . CAD S/P CFX PCI 2010 04/15/2016  . Essential hypertension 04/15/2016  . Noncompliance with medication regimen 04/15/2016  . Chest pain with moderate risk of acute coronary syndrome 04/12/2016  . Abdominal pain 01/12/2015  . Vomiting 01/12/2015  . Nausea and vomiting 01/12/2015    Past Surgical History:  Procedure Laterality Date  . ABDOMINAL HYSTERECTOMY    . ABDOMINAL SURGERY  1995   Bowel resection.  Marland Kitchen CARDIAC CATHETERIZATION N/A 04/14/2016   Procedure: Left Heart Cath and Coronary Angiography;  Surgeon: Burnell Blanks, MD;  Location: [place] CV LAB;  Service: Cardiovascular;  Laterality: N/A;  . CARDIAC SURGERY    .  COLONOSCOPY WITH PROPOFOL N/A 07/11/2019   Procedure: COLONOSCOPY WITH PROPOFOL;  Surgeon: Jonathon Bellows, MD;  Location: [facility] ENDOSCOPY;  Service: Gastroenterology;  Laterality: N/A;  . COLONOSCOPY WITH PROPOFOL N/A 07/29/2019   Procedure: COLONOSCOPY WITH PROPOFOL;  Surgeon: Jonathon Bellows, MD;  Location: [facility] ENDOSCOPY;  Service: Gastroenterology;  Laterality: N/A;  . CORONARY ANGIOPLASTY WITH STENT PLACEMENT  2010   Drug eluting stent  . ESOPHAGOGASTRODUODENOSCOPY (EGD) WITH PROPOFOL N/A  07/11/2019   Procedure: ESOPHAGOGASTRODUODENOSCOPY (EGD) WITH PROPOFOL;  Surgeon: Jonathon Bellows, MD;  Location: [facility] ENDOSCOPY;  Service: Gastroenterology;  Laterality: N/A;  . OVARIAN CYST REMOVAL      Prior to Admission medications   Medication Sig Start Date End Date Taking? Authorizing Provider  albuterol (PROVENTIL) (2.5 MG/3ML) 0.083% nebulizer solution Take 3 mLs (2.5 mg total) by nebulization every 6 (six) hours as needed for wheezing or shortness of breath. 04/06/19  Yes McLean-Scocuzza, Nino Glow, MD  Albuterol Sulfate (PROAIR RESPICLICK) 123XX123 (90 Base) MCG/ACT AEPB Inhale 1 Inhaler into the lungs every 4 (four) hours as needed. 04/14/19  Yes McLean-Scocuzza, Nino Glow, MD  ALPRAZolam Duanne Moron) 0.25 MG tablet Take 1 tablet (0.25 mg total) by mouth at bedtime as needed for anxiety or sleep. 07/05/19  Yes McLean-Scocuzza, Nino Glow, MD  amLODipine (NORVASC) 10 MG tablet Take 1 tablet (10 mg total) by mouth at bedtime. 06/29/19  Yes McLean-Scocuzza, Nino Glow, MD  aspirin EC 81 MG tablet Take 1 tablet (81 mg total) by mouth daily. 11/04/18  Yes McLean-Scocuzza, Nino Glow, MD  clopidogrel (PLAVIX) 75 MG tablet Take 1 tablet (75 mg total) by mouth daily. 04/06/19  Yes McLean-Scocuzza, Nino Glow, MD  cyclobenzaprine (FLEXERIL) 5 MG tablet Take 1 tablet (5 mg total) by mouth at bedtime as needed for muscle spasms. 01/20/19  Yes McLean-Scocuzza, Nino Glow, MD  dicyclomine (BENTYL) 10 MG capsule Take 1 capsule (10 mg total) by mouth 4 (four) times daily -  before meals and at bedtime. 07/04/19  Yes Jonathon Bellows, MD  divalproex (DEPAKOTE) 500 MG DR tablet Take 1 tablet (500 mg total) by mouth 2 (two) times daily. 04/06/19  Yes McLean-Scocuzza, Nino Glow, MD  ezetimibe (ZETIA) 10 MG tablet Take 1 tablet (10 mg total) by mouth daily. With lipitor 80 mg at night 09/05/19  Yes McLean-Scocuzza, Nino Glow, MD  hyoscyamine (LEVSIN) 0.125 MG tablet Take 1 tablet (0.125 mg total) by mouth every 4 (four) hours. As needed 07/20/19 01/16/20 Yes  Jonathon Bellows, MD  ipratropium-albuterol (DUONEB) 0.5-2.5 (3) MG/3ML SOLN Take 3 mLs by nebulization every 6 (six) hours as needed. 12/29/17  Yes McLean-Scocuzza, Nino Glow, MD  isosorbide mononitrate (IMDUR) 30 MG 24 hr tablet Take 1 tablet (30 mg total) by mouth daily. 07/20/19 10/18/19 Yes Visser, Jacquelyn D, PA-C  lidocaine (LIDODERM) 5 % Place 1 patch onto the skin daily. Remove & Discard patch within 12 hours or as directed by MD Patient taking differently: Place 1 patch onto the skin daily as needed. Remove & Discard patch within 12 hours or as directed by MD 11/16/18  Yes McLean-Scocuzza, Nino Glow, MD  nitroGLYCERIN (NITROSTAT) 0.4 MG SL tablet Place 1 tablet (0.4 mg total) under the tongue every 5 (five) minutes as needed for chest pain. X 2 call 911 after 3rd dose 11/04/18  Yes McLean-Scocuzza, Nino Glow, MD  olmesartan-hydrochlorothiazide (BENICAR HCT) 40-25 MG tablet Take 1 tablet by mouth daily. 06/28/19  Yes McLean-Scocuzza, Nino Glow, MD  ondansetron (ZOFRAN ODT) 4 MG disintegrating tablet Take 1 tablet (4  mg total) by mouth every 8 (eight) hours as needed for nausea or vomiting. 07/18/19  Yes Blake Divine, MD  pantoprazole (PROTONIX) 40 MG tablet Take 1 tablet (40 mg total) by mouth 2 (two) times daily. In am before food 05/18/19 11/14/19 Yes Jonathon Bellows, MD  rifaximin (XIFAXAN) 550 MG TABS tablet Take 550 mg by mouth. 09/19/19  Yes [provider]  senna-docusate (SENOKOT-S) 8.6-50 MG tablet Take 1-2 tablets by mouth daily as needed for mild constipation. 02/18/19  Yes McLean-Scocuzza, Nino Glow, MD  traZODone (DESYREL) 150 MG tablet Take 1 tablet (150 mg total) by mouth at bedtime as needed for sleep. 04/06/19  Yes McLean-Scocuzza, Nino Glow, MD  atorvastatin (LIPITOR) 80 MG tablet Take 1 tablet (80 mg total) by mouth daily at 6 PM. 04/06/19 09/13/19  McLean-Scocuzza, Nino Glow, MD  dicyclomine (BENTYL) 10 MG capsule Take 1 capsule (10 mg total) by mouth 4 (four) times daily for 7 days. 09/21/19 09/28/19   Delman Kitten, MD  promethazine (PHENERGAN) 25 MG suppository Place 1 suppository (25 mg total) rectally every 6 (six) hours as needed for nausea. 09/21/19 09/20/20  Delman Kitten, MD    Allergies Ativan [lorazepam], Latex, Tape, Xifaxan [rifaximin], and Drixoral allergy sinus [dexbromphen-pse-apap er]  Family History  Problem Relation Age of Onset  . CAD Mother   . Depression Mother   . Heart disease Mother   . Hyperlipidemia Mother   . Hypertension Mother   . CAD Brother   . Depression Brother   . Diabetes Brother   . Heart disease Brother   . Hyperlipidemia Brother   . Heart disease Father   . Alcohol abuse Father     Social History Social History   Tobacco Use  . Smoking status: Current Every Day Smoker    Packs/day: 0.50    Years: 30.00    Pack years: 15.00    Types: Cigarettes  . Smokeless tobacco: Never Used  . Tobacco comment: refused  Substance Use Topics  . Alcohol use: Not Currently    Comment: once a year  . Drug use: Yes    Frequency: 7.0 times per week    Types: Marijuana    Review of Systems Constitutional: No fever/chills Cardiovascular: Denies chest pain. Respiratory: Denies shortness of breath. Gastrointestinal: See HPI Genitourinary: Negative for dysuria.  Denies pregnancy. Musculoskeletal: Negative for back pain. Skin: Negative for rash. Neurological: Negative for headaches or weakness.    ____________________________________________   PHYSICAL EXAM:  VITAL SIGNS: ED Triage Vitals  Enc Vitals Group     BP 09/21/19 0932 (!) 173/118     Pulse Rate 09/21/19 0932 97     Resp 09/21/19 0932 20     Temp 09/21/19 0932 99.1 F (37.3 C)     Temp Source 09/21/19 0932 Oral     SpO2 09/21/19 0932 98 %     Weight 09/21/19 0915 187 lb 6.3 oz (85 kg)     Height 09/21/19 0915 5\' 3"  (1.6 m)     Head Circumference --      Peak Flow --      Pain Score 09/21/19 0915 7     Pain Loc --      Pain Edu? --      Excl. in [place]? --     Constitutional:  Alert and oriented.  She is writhing about in the bed, dry heaving frequently.  Reports large amount of vomiting, but appears more like she is dry heaving.  No bloody emesis. Eyes:  Conjunctivae are normal. Head: Atraumatic. Nose: No congestion/rhinnorhea. Mouth/Throat: Mucous membranes are dry. Neck: No stridor.  Cardiovascular: Minimally tachycardic rate, regular rhythm. Grossly normal heart sounds.  Good peripheral circulation. Respiratory: Normal respiratory effort.  No retractions. Lungs CTAB. Gastrointestinal: Soft and nontender except reports tenderness in the left upper quadrant without rebound or guarding. No distention. Musculoskeletal: No lower extremity tenderness nor edema. Neurologic:  Normal speech and language. No gross focal neurologic deficits are appreciated.  Skin:  Skin is warm, dry and intact. No rash noted. Psychiatric: Mood and affect are normal. Speech and behavior are normal.  ____________________________________________   LABS (all labs ordered are listed, but only abnormal results are displayed)  Labs Reviewed  CBC - Abnormal; Notable for the following components:      Result Value   WBC 19.3 (*)    Platelets 632 (*)    All other components within normal limits  COMPREHENSIVE METABOLIC PANEL - Abnormal; Notable for the following components:   Potassium 3.2 (*)    Chloride 95 (*)    Glucose, Bld 185 (*)    BUN 40 (*)    Creatinine, Ser 1.07 (*)    AST 122 (*)    ALT 146 (*)    GFR calc non Af Amer 60 (*)    Anion gap 18 (*)    All other components within normal limits  LIPASE, BLOOD - Abnormal; Notable for the following components:   Lipase 67 (*)    All other components within normal limits   ____________________________________________  EKG  Reviewed by me at 10 AM Heart rate 100 QRS 80 QTc 480 Mild sinus tachycardia, mild ST depression mild T wave abnormality noted in multiple leads.  No STEMI.  Of note, the patient is not complaining of  any cardiac chest or pulmonary symptoms at this time ____________________________________________  RADIOLOGY  CT ABDOMEN PELVIS W CONTRAST  Result Date: 09/21/2019 CLINICAL DATA:  Left-sided abdominal pain for 2 days. Nausea and vomiting. EXAM: CT ABDOMEN AND PELVIS WITH CONTRAST TECHNIQUE: Multidetector CT imaging of the abdomen and pelvis was performed using the standard protocol following bolus administration of intravenous contrast. CONTRAST:  173mL OMNIPAQUE IOHEXOL 300 MG/ML  SOLN COMPARISON:  07/18/2019 FINDINGS: Lower Chest: No acute findings. Hepatobiliary: No hepatic masses identified. Gallbladder is unremarkable. No evidence of biliary ductal dilatation. Pancreas:  No mass or inflammatory changes. Spleen: Within normal limits in size and appearance. Adrenals/Urinary Tract: No masses identified. No evidence of ureteral calculi or hydronephrosis. Stomach/Bowel: Surgical anastomosis again seen in the sigmoid colon. No evidence of obstruction, inflammatory process or abnormal fluid collections. Vascular/Lymphatic: No pathologically enlarged lymph nodes. No abdominal aortic aneurysm. Aortic atherosclerosis incidentally noted. Reproductive: Prior hysterectomy noted. Adnexal regions are unremarkable in appearance. Other:  None. Musculoskeletal:  No suspicious bone lesions identified. IMPRESSION: Stable exam. No acute findings or other significant abnormality identified. Electronically Signed   By: Marlaine Hind M.D.   On: 09/21/2019 12:25    CT abdomen pelvis reviewed, negative for acute findings ____________________________________________   PROCEDURES  Procedure(s) performed: None  Procedures  Critical Care performed: No  ____________________________________________   INITIAL IMPRESSION / ASSESSMENT AND PLAN / ED COURSE  Pertinent labs & imaging results that were available during my care of the patient were reviewed by me and considered in my medical decision making (see chart for  details).   Differential diagnosis includes but is not limited to, abdominal perforation, aortic dissection, cholecystitis, appendicitis, diverticulitis, colitis, esophagitis/gastritis, kidney stone, pyelonephritis, urinary tract infection,  aortic aneurysm. All are considered in decision and treatment plan. Based upon the patient's presentation and risk factors, will provide antiemetics, pain relief.  She reports very similar occurring in January.  She does have nonspecific T wave around EKG, however denies any chest pain or shortness of breath.  Not consistent with ACS   Clinical Course as of Sep 20 1499  Wed Sep 21, 2019  1327 Patient reports she is starting to feel better, she would like to try to keep some fluids down.  Would like something for muscle or crampy abdominal spasms, will trial Bentyl as well as some Maalox.  She appears much improved.  Reports her pain is much better.  She is resting much more comfortably.  Labs reviewed has a very mild transaminitis, also minimally elevated anion gap which I think is due to her mild hypochloremia.  Her CO2 is normal.  Elevated white count is present, but also has a history of recent leukocytosis and thrombocytosis   [MQ]    Clinical Course User Index [MQ] Delman Kitten, MD    ----------------------------------------- 3:00 PM on 09/21/2019 -----------------------------------------  Patient awake alert taking by mouth.  Feels much better.  She is alert in no distress appears appropriate for discharge.  Reports she feels much improved, plans to follow-up closely with Dr. Vicente Males.  DAWANNA LENSCH was evaluated in Emergency Department on 09/21/2019 for the symptoms described in the history of present illness. She was evaluated in the context of the global COVID-19 pandemic, which necessitated consideration that the patient might be at risk for infection with the SARS-CoV-2 virus that causes COVID-19. Institutional protocols and algorithms that pertain  to the evaluation of patients at risk for COVID-19 are in a state of rapid change based on information released by regulatory bodies including the CDC and federal and state organizations. These policies and algorithms were followed during the patient's care in the ED.  Return precautions and treatment recommendations and follow-up discussed with the patient who is agreeable with the plan.  ____________________________________________   FINAL CLINICAL IMPRESSION(S) / ED DIAGNOSES  Final diagnoses:  Non-intractable vomiting with nausea, unspecified vomiting type        Note:  This document was prepared using Dragon voice recognition software and may include unintentional dictation errors       Delman Kitten, MD 09/21/19 1501

## 2019-09-21 NOTE — ED Triage Notes (Signed)
Pt here with reports of generalized abd pain and vomiting x 2 days.

## 2019-09-21 NOTE — ED Notes (Signed)
Pt in CT at this time.

## 2019-09-21 NOTE — ED Notes (Signed)
Pt c/o cramping throughout back and abdomen. Dr. Jacqualine Code notified, dilaudid ordered and administered

## 2019-09-21 NOTE — Discharge Instructions (Signed)
? ?  Please return to the emergency room right away if you are to develop a fever, severe nausea, your pain becomes severe or worsens, you are unable to keep food down, begin vomiting any dark or bloody fluid, you develop any dark or bloody stools, feel dehydrated, or other new concerns or symptoms arise. ? ?

## 2019-09-21 NOTE — ED Notes (Signed)
Pt given ice water and graham crackers as encouraged by Dr. Jacqualine Code. Pt refusing to eat at this time.

## 2019-09-30 ENCOUNTER — Other Ambulatory Visit: Payer: Self-pay

## 2019-09-30 ENCOUNTER — Inpatient Hospital Stay (HOSPITAL_BASED_OUTPATIENT_CLINIC_OR_DEPARTMENT_OTHER): Payer: Medicare Other | Admitting: Oncology

## 2019-09-30 DIAGNOSIS — D72829 Elevated white blood cell count, unspecified: Secondary | ICD-10-CM

## 2019-09-30 LAB — CALR + MPL (REFLEXED)

## 2019-09-30 LAB — JAK2 W/REFLEX TO CALR/MPL

## 2019-10-04 NOTE — Progress Notes (Signed)
I connected with [facility] on 10/04/19 at  2:45 PM EDT by video enabled telemedicine visit and verified that I am speaking with the correct person using two identifiers.   I discussed the limitations, risks, security and privacy concerns of performing an evaluation and management service by telemedicine and the availability of in-person appointments. I also discussed with the patient that there may be a patient responsible charge related to this service. The patient expressed understanding and agreed to proceed.  Other persons participating in the visit and their role in the encounter:  none  Patient's location:  home Provider's location:  Work  Diagnosis: Leukocytosis/thrombocytosis likely reactive  Chief Complaint: Discuss results of blood work  History of present illness: patient is a 54 year old female with a past medical history significant for a s/p stent placement, COPD, GERD referred to me for leukocytosis/thrombocytosis.Looking back at her CBCs patient has had chronic leukocytosis at least dating back to 2016 when her white cell count fluctuates widely between 11-20.  Differential has mostly show neutrophilia but at times has shown lymphocytosis and monocytosis.  Hemoglobin is always been normal but most recently patient was noted to have thrombocytosis as well.  Her most recent CBC from 07/18/2019 showed white cell count of 17, H&H of 13.7/40.8 and a platelet count of 550.  Patient admits to smoking daily about half a pack per day.  Also admits to smoking marijuana occasionally.   Interval history patient was given Xifaxan for possible bacterial overgrowth but patient states that she did not take the medicine after 1 or 2 doses because it made her feel worse.  Currently she continues to struggle with episodes of crampy abdominal pain   Review of Systems  Constitutional: Positive for malaise/fatigue. Negative for chills, fever and weight loss.  HENT: Negative for congestion, ear  discharge and nosebleeds.   Eyes: Negative for blurred vision.  Respiratory: Negative for cough, hemoptysis, sputum production, shortness of breath and wheezing.   Cardiovascular: Negative for chest pain, palpitations, orthopnea and claudication.  Gastrointestinal: Positive for abdominal pain. Negative for blood in stool, constipation, diarrhea, heartburn, melena, nausea and vomiting.  Genitourinary: Negative for dysuria, flank pain, frequency, hematuria and urgency.  Musculoskeletal: Negative for back pain, joint pain and myalgias.  Skin: Negative for rash.  Neurological: Negative for dizziness, tingling, focal weakness, seizures, weakness and headaches.  Endo/Heme/Allergies: Does not bruise/bleed easily.  Psychiatric/Behavioral: Negative for depression and suicidal ideas. The patient does not have insomnia.     Allergies  Allergen Reactions  . Ativan [Lorazepam]     Pt states it makes her tongue do weird things   . Latex Other (See Comments)    Patient stated that she was told by her doctor that she is "allergic to" this  . Tape Other (See Comments)    Patient stated that she was told by her doctor that she is "allergic to" this  . Xifaxan [Rifaximin]     Pt believes this is contributing to her abdominal pain/discomfort  . Drixoral Allergy Sinus [Dexbromphen-Pse-Apap Er] Rash    Past Medical History:  Diagnosis Date  . Anxiety   . Asthma   . Bipolar 1 disorder ([place])   . Bipolar disorder ([place])   . CAD (coronary artery disease)    s/p stent BMS OM Cx  . Cervical herniated disc 04/12/2016  . COPD (chronic obstructive pulmonary disease) ([place])   . Depression   . Diabetes mellitus without complication ([place])   . Diverticulitis   . GERD (gastroesophageal  reflux disease)   . Glaucoma   . History of blood transfusion   . Hyperlipidemia   . Hypertension   . OSA (obstructive sleep apnea)    not using cpap   . Plantar fasciitis    b/l feet s/p steroid shots w/o help and left  surgery w/o help   . UTI (urinary tract infection)     Past Surgical History:  Procedure Laterality Date  . ABDOMINAL HYSTERECTOMY    . ABDOMINAL SURGERY  1995   Bowel resection.  Marland Kitchen CARDIAC CATHETERIZATION N/A 04/14/2016   Procedure: Left Heart Cath and Coronary Angiography;  Surgeon: Burnell Blanks, MD;  Location: [facility];  Service: Cardiovascular;  Laterality: N/A;  . CARDIAC SURGERY    . COLONOSCOPY WITH PROPOFOL N/A 07/11/2019   Procedure: COLONOSCOPY WITH PROPOFOL;  Surgeon: Jonathon Bellows, MD;  Location: [facility] ENDOSCOPY;  Service: Gastroenterology;  Laterality: N/A;  . COLONOSCOPY WITH PROPOFOL N/A 07/29/2019   Procedure: COLONOSCOPY WITH PROPOFOL;  Surgeon: Jonathon Bellows, MD;  Location: [facility] ENDOSCOPY;  Service: Gastroenterology;  Laterality: N/A;  . CORONARY ANGIOPLASTY WITH STENT PLACEMENT  2010   Drug eluting stent  . ESOPHAGOGASTRODUODENOSCOPY (EGD) WITH PROPOFOL N/A 07/11/2019   Procedure: ESOPHAGOGASTRODUODENOSCOPY (EGD) WITH PROPOFOL;  Surgeon: Jonathon Bellows, MD;  Location: [facility] ENDOSCOPY;  Service: Gastroenterology;  Laterality: N/A;  . OVARIAN CYST REMOVAL      Social History   Socioeconomic History  . Marital status: Single    Spouse name: Not on file  . Number of children: Not on file  . Years of education: Not on file  . Highest education level: Not on file  Occupational History  . Not on file  Tobacco Use  . Smoking status: Current Every Day Smoker    Packs/day: 0.50    Years: 30.00    Pack years: 15.00    Types: Cigarettes  . Smokeless tobacco: Never Used  . Tobacco comment: refused  Substance and Sexual Activity  . Alcohol use: Not Currently    Comment: once a year  . Drug use: Yes    Frequency: 7.0 times per week    Types: Marijuana  . Sexual activity: Not on file  Other Topics Concern  . Not on file  Social History Narrative   From [place] now living in [place]    1 son    No guns    Wears seat belt   Safe in  relationship    Social Determinants of Health   Financial Resource Strain:   . Difficulty of Paying Living Expenses:   Food Insecurity:   . Worried About Charity fundraiser in the Last Year:   . Arboriculturist in the Last Year:   Transportation Needs:   . Film/video editor (Medical):   Marland Kitchen Lack of Transportation (Non-Medical):   Physical Activity:   . Days of Exercise per Week:   . Minutes of Exercise per Session:   Stress:   . Feeling of Stress :   Social Connections:   . Frequency of Communication with Friends and Family:   . Frequency of Social Gatherings with Friends and Family:   . Attends Religious Services:   . Active Member of Clubs or Organizations:   . Attends Archivist Meetings:   Marland Kitchen Marital Status:   Intimate Partner Violence:   . Fear of Current or Ex-Partner:   . Emotionally Abused:   Marland Kitchen Physically Abused:   . Sexually Abused:  Family History  Problem Relation Age of Onset  . CAD Mother   . Depression Mother   . Heart disease Mother   . Hyperlipidemia Mother   . Hypertension Mother   . CAD Brother   . Depression Brother   . Diabetes Brother   . Heart disease Brother   . Hyperlipidemia Brother   . Heart disease Father   . Alcohol abuse Father      Current Outpatient Medications:  .  albuterol (PROVENTIL) (2.5 MG/3ML) 0.083% nebulizer solution, Take 3 mLs (2.5 mg total) by nebulization every 6 (six) hours as needed for wheezing or shortness of breath., Disp: 360 mL, Rfl: 12 .  Albuterol Sulfate (PROAIR RESPICLICK) 161 (90 Base) MCG/ACT AEPB, Inhale 1 Inhaler into the lungs every 4 (four) hours as needed., Disp: 3 each, Rfl: 4 .  ALPRAZolam (XANAX) 0.25 MG tablet, Take 1 tablet (0.25 mg total) by mouth at bedtime as needed for anxiety or sleep., Disp: 30 tablet, Rfl: 2 .  amLODipine (NORVASC) 10 MG tablet, Take 1 tablet (10 mg total) by mouth at bedtime., Disp: 90 tablet, Rfl: 3 .  aspirin EC 81 MG tablet, Take 1 tablet (81 mg total) by  mouth daily., Disp: 90 tablet, Rfl: 3 .  atorvastatin (LIPITOR) 80 MG tablet, Take 1 tablet (80 mg total) by mouth daily at 6 PM., Disp: 90 tablet, Rfl: 3 .  clopidogrel (PLAVIX) 75 MG tablet, Take 1 tablet (75 mg total) by mouth daily., Disp: 90 tablet, Rfl: 3 .  cyclobenzaprine (FLEXERIL) 5 MG tablet, Take 1 tablet (5 mg total) by mouth at bedtime as needed for muscle spasms., Disp: 30 tablet, Rfl: 0 .  dicyclomine (BENTYL) 10 MG capsule, Take 1 capsule (10 mg total) by mouth 4 (four) times daily -  before meals and at bedtime., Disp: 120 capsule, Rfl: 2 .  dicyclomine (BENTYL) 10 MG capsule, Take 1 capsule (10 mg total) by mouth 4 (four) times daily for 7 days., Disp: 28 capsule, Rfl: 0 .  divalproex (DEPAKOTE) 500 MG DR tablet, Take 1 tablet (500 mg total) by mouth 2 (two) times daily., Disp: 180 tablet, Rfl: 1 .  ezetimibe (ZETIA) 10 MG tablet, Take 1 tablet (10 mg total) by mouth daily. With lipitor 80 mg at night, Disp: 90 tablet, Rfl: 3 .  hyoscyamine (LEVSIN) 0.125 MG tablet, Take 1 tablet (0.125 mg total) by mouth every 4 (four) hours. As needed, Disp: 180 tablet, Rfl: 5 .  ipratropium-albuterol (DUONEB) 0.5-2.5 (3) MG/3ML SOLN, Take 3 mLs by nebulization every 6 (six) hours as needed., Disp: 360 mL, Rfl: 11 .  isosorbide mononitrate (IMDUR) 30 MG 24 hr tablet, Take 1 tablet (30 mg total) by mouth daily., Disp: 90 tablet, Rfl: 0 .  lidocaine (LIDODERM) 5 %, Place 1 patch onto the skin daily. Remove & Discard patch within 12 hours or as directed by MD (Patient taking differently: Place 1 patch onto the skin daily as needed. Remove & Discard patch within 12 hours or as directed by MD), Disp: 30 patch, Rfl: 0 .  nitroGLYCERIN (NITROSTAT) 0.4 MG SL tablet, Place 1 tablet (0.4 mg total) under the tongue every 5 (five) minutes as needed for chest pain. X 2 call 911 after 3rd dose, Disp: 30 tablet, Rfl: 5 .  olmesartan-hydrochlorothiazide (BENICAR HCT) 40-25 MG tablet, Take 1 tablet by mouth daily.,  Disp: 90 tablet, Rfl: 3 .  ondansetron (ZOFRAN ODT) 4 MG disintegrating tablet, Take 1 tablet (4 mg total) by mouth every  8 (eight) hours as needed for nausea or vomiting., Disp: 12 tablet, Rfl: 0 .  pantoprazole (PROTONIX) 40 MG tablet, Take 1 tablet (40 mg total) by mouth 2 (two) times daily. In am before food, Disp: 60 tablet, Rfl: 5 .  promethazine (PHENERGAN) 25 MG suppository, Place 1 suppository (25 mg total) rectally every 6 (six) hours as needed for nausea., Disp: 12 suppository, Rfl: 1 .  rifaximin (XIFAXAN) 550 MG TABS tablet, Take 550 mg by mouth., Disp: , Rfl:  .  senna-docusate (SENOKOT-S) 8.6-50 MG tablet, Take 1-2 tablets by mouth daily as needed for mild constipation., Disp: 90 tablet, Rfl: 1 .  traZODone (DESYREL) 150 MG tablet, Take 1 tablet (150 mg total) by mouth at bedtime as needed for sleep., Disp: 90 tablet, Rfl: 3  CT ABDOMEN PELVIS W CONTRAST  Result Date: 09/21/2019 CLINICAL DATA:  Left-sided abdominal pain for 2 days. Nausea and vomiting. EXAM: CT ABDOMEN AND PELVIS WITH CONTRAST TECHNIQUE: Multidetector CT imaging of the abdomen and pelvis was performed using the standard protocol following bolus administration of intravenous contrast. CONTRAST:  187m OMNIPAQUE IOHEXOL 300 MG/ML  SOLN COMPARISON:  07/18/2019 FINDINGS: Lower Chest: No acute findings. Hepatobiliary: No hepatic masses identified. Gallbladder is unremarkable. No evidence of biliary ductal dilatation. Pancreas:  No mass or inflammatory changes. Spleen: Within normal limits in size and appearance. Adrenals/Urinary Tract: No masses identified. No evidence of ureteral calculi or hydronephrosis. Stomach/Bowel: Surgical anastomosis again seen in the sigmoid colon. No evidence of obstruction, inflammatory process or abnormal fluid collections. Vascular/Lymphatic: No pathologically enlarged lymph nodes. No abdominal aortic aneurysm. Aortic atherosclerosis incidentally noted. Reproductive: Prior hysterectomy noted.  Adnexal regions are unremarkable in appearance. Other:  None. Musculoskeletal:  No suspicious bone lesions identified. IMPRESSION: Stable exam. No acute findings or other significant abnormality identified. Electronically Signed   By: JMarlaine HindM.D.   On: 09/21/2019 12:25    No images are attached to the encounter.   CMP Latest Ref Rng & Units 09/21/2019  Glucose 70 - 99 mg/dL 185(H)  BUN 6 - 20 mg/dL 40(H)  Creatinine 0.44 - 1.00 mg/dL 1.07(H)  Sodium 135 - 145 mmol/L 137  Potassium 3.5 - 5.1 mmol/L 3.2(L)  Chloride 98 - 111 mmol/L 95(L)  CO2 22 - 32 mmol/L 24  Calcium 8.9 - 10.3 mg/dL 9.6  Total Protein 6.5 - 8.1 g/dL 8.1  Total Bilirubin 0.3 - 1.2 mg/dL 0.6  Alkaline Phos 38 - 126 U/L 85  AST 15 - 41 U/L 122(H)  ALT 0 - 44 U/L 146(H)   CBC Latest Ref Rng & Units 09/21/2019  WBC 4.0 - 10.5 K/uL 19.3(H)  Hemoglobin 12.0 - 15.0 g/dL 13.7  Hematocrit 36.0 - 46.0 % 39.7  Platelets 150 - 400 K/uL 632(H)     Observation/objective: Appears in no acute distress of a video visit today.  Breathing is nonlabored  Assessment and plan:Patient is a 54year old female referred for leukocytosis/thrombocytosis likely reactive  Results of blood work from 09/13/2019 were as follows: CBC showed a white cell count of 11.8, H&H of 11.7/36 and a platelet count of 370.  Flow cytometry panel was negative for any immunophenotypic abnormality.  BCR ABL testing was negative.  JAK2 testing was negative.  Ferritin and iron studies were normal.  CALR and MPL mutation testing was negative.  Repeat CBC then a week later at the GI office showed a white cell count of 19 and a platelet count of 632.  She therefore has waxing and waning leukocytosis  and thrombocytosis which I suspect is secondary to inflammation/smoking/marijuana use.At this time I am inclined to monitor her CBC and platelet count conservatively.  There is a consistent rising trend in her counts I will consider a bone marrow biopsy at that  time  Follow-up instructions: CBC with differential in 6 weeks and 12 weeks and I will see her back in 12 weeks  I discussed the assessment and treatment plan with the patient. The patient was provided an opportunity to ask questions and all were answered. The patient agreed with the plan and demonstrated an understanding of the instructions.   The patient was advised to call back or seek an in-person evaluation if the symptoms worsen or if the condition fails to improve as anticipated.    Visit Diagnosis: 1. Leukocytosis, unspecified type     Dr. Randa Evens, MD, MPH [facility] at [facility] Tel- [phone] 10/04/2019 8:28 AM

## 2019-10-05 ENCOUNTER — Ambulatory Visit (INDEPENDENT_AMBULATORY_CARE_PROVIDER_SITE_OTHER): Payer: Medicare Other | Admitting: Gastroenterology

## 2019-10-05 DIAGNOSIS — R945 Abnormal results of liver function studies: Secondary | ICD-10-CM | POA: Diagnosis not present

## 2019-10-05 DIAGNOSIS — R140 Abdominal distension (gaseous): Secondary | ICD-10-CM

## 2019-10-05 DIAGNOSIS — R7989 Other specified abnormal findings of blood chemistry: Secondary | ICD-10-CM

## 2019-10-05 NOTE — Progress Notes (Signed)
Jonathon Bellows , MD [address]  [place]  [place], [place] [address]  Main: [phone]  Fax: [phone]   Primary Care Physician: McLean-Scocuzza, Nino Glow, MD  Virtual Visit via Telephone Note  I connected with patient on 10/05/19 at  8:45 AM EDT by telephone and verified that I am speaking with the correct person using two identifiers.   I discussed the limitations, risks, security and privacy concerns of performing an evaluation and management service by telephone and the availability of in person appointments. I also discussed with the patient that there may be a patient responsible charge related to this service. The patient expressed understanding and agreed to proceed.  Location of Patient: Home Location of Provider: Home Persons involved: Patient and provider only   History of Present Illness: ER visit follow-up  HPI: Shawna Anderson is a 54 y.o. female   Summary of history :  Initially seen and referred in November 2020 for GERD.   GERD> 10 years. Gained weight recently. At initial visit she was on Protonix once a day which was not helping her. Symptoms of heartburn regurgitation.She says that she has had rupture of her intestines at the age of 77 possibly due to diverticulitis. Initial visit she had pain in the left lower quadrant which has been ongoing since visit to ER in November 2020. At that time she was given a course of antibiotics as there is inflammation seen on the CT scan at her distal colonic anastomosis. History of partial colectomy. Hysterectomy showing an indeterminant left adnexal soft tissue focus adjacent to perianastomotic thick-walled blind ending distal colonic limb. Left ovarian mass cannot be excluded entirely.    05/20/2019: Ultrasound pelvis: Collection with a shadowing calcification seen in the left adnexa adjacent to the pelvic sidewall correspondents well to a collection seen on previous CT. Possible postsurgical or  lesion of ovarian adnexal origin. Recommended to follow-up CT or MRI in 1 to 2 months.  07/11/2019: EGD: Nonobstructive Schatzki's ring seen inflammation seen at the antrum of the stomach, biopsies showed no significant abnormalities. Colonoscopy: Patient was unable to be sedated and hence procedure could not be performed she has had issues with sedation in the past.  07/18/2019: Came into the emergency room with abdominal pain. CT scan of the abdomen was repeated showed no significant abnormality. Hemoglobin 13.7 g. White cell count of 17. CMP normal except an elevated glucose of 150. Lipase normal. 07/29/2019: Colonoscopy: 5 mm polyp in the distal ascending colon resected otherwise normal colonoscopy pathology of the polyp suggests mucosal tearing ectasia and is benign.  Interval history3/09/2019-10/05/2019  ED visit on 09/21/2019 for severe retching, dry heaving and left-sided abdominal pain.  Treated with IV fluids Maalox and discharge.  09/21/2019 CT scan of the abdomen and pelvis with contrast showed no acute abnormality.  09/21/2019: AST, ALT were elevated at 122 and 146.  Creatinine was higher than baseline at 1.07.  Potassium was 3.2.  Since the ER visit she states she started an Atkins diet and all her symptoms have resolved.  She continues to smoke which advised her to stop.  Current Outpatient Medications  Medication Sig Dispense Refill   albuterol (PROVENTIL) (2.5 MG/3ML) 0.083% nebulizer solution Take 3 mLs (2.5 mg total) by nebulization every 6 (six) hours as needed for wheezing or shortness of breath. 360 mL 12   Albuterol Sulfate (PROAIR RESPICLICK) 123XX123 (90 Base) MCG/ACT AEPB Inhale 1 Inhaler into the lungs every 4 (four) hours as needed. 3 each  4   ALPRAZolam (XANAX) 0.25 MG tablet Take 1 tablet (0.25 mg total) by mouth at bedtime as needed for anxiety or sleep. 30 tablet 2   amLODipine (NORVASC) 10 MG tablet Take 1 tablet (10 mg total) by mouth at bedtime. 90 tablet 3    aspirin EC 81 MG tablet Take 1 tablet (81 mg total) by mouth daily. 90 tablet 3   atorvastatin (LIPITOR) 80 MG tablet Take 1 tablet (80 mg total) by mouth daily at 6 PM. 90 tablet 3   clopidogrel (PLAVIX) 75 MG tablet Take 1 tablet (75 mg total) by mouth daily. 90 tablet 3   cyclobenzaprine (FLEXERIL) 5 MG tablet Take 1 tablet (5 mg total) by mouth at bedtime as needed for muscle spasms. 30 tablet 0   dicyclomine (BENTYL) 10 MG capsule Take 1 capsule (10 mg total) by mouth 4 (four) times daily -  before meals and at bedtime. 120 capsule 2   dicyclomine (BENTYL) 10 MG capsule Take 1 capsule (10 mg total) by mouth 4 (four) times daily for 7 days. 28 capsule 0   divalproex (DEPAKOTE) 500 MG DR tablet Take 1 tablet (500 mg total) by mouth 2 (two) times daily. 180 tablet 1   ezetimibe (ZETIA) 10 MG tablet Take 1 tablet (10 mg total) by mouth daily. With lipitor 80 mg at night 90 tablet 3   hyoscyamine (LEVSIN) 0.125 MG tablet Take 1 tablet (0.125 mg total) by mouth every 4 (four) hours. As needed 180 tablet 5   ipratropium-albuterol (DUONEB) 0.5-2.5 (3) MG/3ML SOLN Take 3 mLs by nebulization every 6 (six) hours as needed. 360 mL 11   isosorbide mononitrate (IMDUR) 30 MG 24 hr tablet Take 1 tablet (30 mg total) by mouth daily. 90 tablet 0   lidocaine (LIDODERM) 5 % Place 1 patch onto the skin daily. Remove & Discard patch within 12 hours or as directed by MD (Patient taking differently: Place 1 patch onto the skin daily as needed. Remove & Discard patch within 12 hours or as directed by MD) 30 patch 0   nitroGLYCERIN (NITROSTAT) 0.4 MG SL tablet Place 1 tablet (0.4 mg total) under the tongue every 5 (five) minutes as needed for chest pain. X 2 call 911 after 3rd dose 30 tablet 5   olmesartan-hydrochlorothiazide (BENICAR HCT) 40-25 MG tablet Take 1 tablet by mouth daily. 90 tablet 3   ondansetron (ZOFRAN ODT) 4 MG disintegrating tablet Take 1 tablet (4 mg total) by mouth every 8 (eight)  hours as needed for nausea or vomiting. 12 tablet 0   pantoprazole (PROTONIX) 40 MG tablet Take 1 tablet (40 mg total) by mouth 2 (two) times daily. In am before food 60 tablet 5   promethazine (PHENERGAN) 25 MG suppository Place 1 suppository (25 mg total) rectally every 6 (six) hours as needed for nausea. 12 suppository 1   rifaximin (XIFAXAN) 550 MG TABS tablet Take 550 mg by mouth.     senna-docusate (SENOKOT-S) 8.6-50 MG tablet Take 1-2 tablets by mouth daily as needed for mild constipation. 90 tablet 1   traZODone (DESYREL) 150 MG tablet Take 1 tablet (150 mg total) by mouth at bedtime as needed for sleep. 90 tablet 3   No current facility-administered medications for this visit.    Allergies as of 10/05/2019 - Review Complete 09/21/2019  Allergen Reaction Noted   Ativan [lorazepam]  11/14/2017   Latex Other (See Comments) 02/12/2017   Tape Other (See Comments) 02/12/2017   Xifaxan [rifaximin]  09/21/2019  Drixoral allergy sinus [dexbromphen-pse-apap er] Rash 01/12/2015    Review of Systems:    All systems reviewed and negative except where noted in HPI.   Observations/Objective:  Labs: CMP     Component Value Date/Time   NA 137 09/21/2019 0927   NA 140 10/26/2014 1334   K 3.2 (L) 09/21/2019 0927   K 3.7 10/26/2014 1334   CL 95 (L) 09/21/2019 0927   CL 106 10/26/2014 1334   CO2 24 09/21/2019 0927   CO2 27 10/26/2014 1334   GLUCOSE 185 (H) 09/21/2019 0927   GLUCOSE 96 10/26/2014 1334   BUN 40 (H) 09/21/2019 0927   BUN 17 10/26/2014 1334   CREATININE 1.07 (H) 09/21/2019 0927   CREATININE 0.74 12/31/2017 0905   CALCIUM 9.6 09/21/2019 0927   CALCIUM 9.3 10/26/2014 1334   PROT 8.1 09/21/2019 0927   PROT 7.6 10/26/2014 1334   ALBUMIN 4.7 09/21/2019 0927   ALBUMIN 4.3 10/26/2014 1334   AST 122 (H) 09/21/2019 0927   AST 24 10/26/2014 1334   ALT 146 (H) 09/21/2019 0927   ALT 35 10/26/2014 1334   ALKPHOS 85 09/21/2019 0927   ALKPHOS 90 10/26/2014 1334    BILITOT 0.6 09/21/2019 0927   BILITOT 0.7 10/26/2014 1334   GFRNONAA 60 (L) 09/21/2019 0927   GFRNONAA >60 10/26/2014 1334   GFRAA >60 09/21/2019 0927   GFRAA >60 10/26/2014 1334   Lab Results  Component Value Date   WBC 19.3 (H) 09/21/2019   HGB 13.7 09/21/2019   HCT 39.7 09/21/2019   MCV 85.9 09/21/2019   PLT 632 (H) 09/21/2019    Imaging Studies: CT ABDOMEN PELVIS W CONTRAST  Result Date: 09/21/2019 CLINICAL DATA:  Left-sided abdominal pain for 2 days. Nausea and vomiting. EXAM: CT ABDOMEN AND PELVIS WITH CONTRAST TECHNIQUE: Multidetector CT imaging of the abdomen and pelvis was performed using the standard protocol following bolus administration of intravenous contrast. CONTRAST:  139mL OMNIPAQUE IOHEXOL 300 MG/ML  SOLN COMPARISON:  07/18/2019 FINDINGS: Lower Chest: No acute findings. Hepatobiliary: No hepatic masses identified. Gallbladder is unremarkable. No evidence of biliary ductal dilatation. Pancreas:  No mass or inflammatory changes. Spleen: Within normal limits in size and appearance. Adrenals/Urinary Tract: No masses identified. No evidence of ureteral calculi or hydronephrosis. Stomach/Bowel: Surgical anastomosis again seen in the sigmoid colon. No evidence of obstruction, inflammatory process or abnormal fluid collections. Vascular/Lymphatic: No pathologically enlarged lymph nodes. No abdominal aortic aneurysm. Aortic atherosclerosis incidentally noted. Reproductive: Prior hysterectomy noted. Adnexal regions are unremarkable in appearance. Other:  None. Musculoskeletal:  No suspicious bone lesions identified. IMPRESSION: Stable exam. No acute findings or other significant abnormality identified. Electronically Signed   By: Marlaine Hind M.D.   On: 09/21/2019 12:25    Assessment and Plan:   ZULEIDY SANER is a 54 y.o. y/o female  here to follow-up for bloating and abdominal distention which could be due to adhesions from her prior abdominal surgery. She has had this issues  for many years she states. I have also suggested she stop smoking marijuana as it can also lead to aerophagia and trapping of gas.   She has got minimal relief from a low FODMAP diet and increasing the PPI.  I suggested most likely the cause is from aerophagia and smoking of marijuana.  There is also possibility she may have small bowel bacterial overgrowth syndrome.  I strongly suggest her to stop smoking marijuana if she can but she states it is almost impossible.  Since her ER visit  all symptoms are resolved after she went on an Atkins diet.   Plan 1.  Recheck LFTs, BMP as they were elevated at her recent ER visit. 2.  Strongly suggest to stop marijuana as it can cause aerophagia, vomiting, bloating. Since all her symptoms are resolved advised her to call us back if symptoms recur.   I discussed the assessment and treatment plan with the patient. The patient was provided an opportunity to ask questions and all were answered. The patient agreed with the plan and demonstrated an understanding of the instructions.   The patient was advised to call back or seek an in-person evaluation if the symptoms worsen or if the condition fails to improve as anticipated.  I provided 12 minutes of non-face-to-face time during this encounter.  Dr Jonathon Bellows MD,MRCP [facility]) Gastroenterology/Hepatology Pager: [phone]   Speech recognition software was used to dictate this note.

## 2019-10-13 ENCOUNTER — Ambulatory Visit (INDEPENDENT_AMBULATORY_CARE_PROVIDER_SITE_OTHER): Payer: Medicare Other

## 2019-10-13 ENCOUNTER — Other Ambulatory Visit: Payer: Self-pay

## 2019-10-13 VITALS — Ht 63.0 in | Wt 187.0 lb

## 2019-10-13 DIAGNOSIS — Z0000 Encounter for general adult medical examination without abnormal findings: Secondary | ICD-10-CM | POA: Diagnosis not present

## 2019-10-13 NOTE — Patient Instructions (Addendum)
  Shawna Anderson , Thank you for taking time to come for your Medicare Wellness Visit. I appreciate your ongoing commitment to your health goals. Please review the following plan we discussed and let me know if I can assist you in the future.   These are the goals we discussed: Goals    . Increase physical activity     As tolerated       This is a list of the screening recommended for you and due dates:  Health Maintenance  Topic Date Due  . Tetanus Vaccine  Never done  . Mammogram  Never done  . Flu Shot  02/12/2020  . Colon Cancer Screening  07/28/2020  . HIV Screening  Completed  . Pap Smear  Discontinued

## 2019-10-13 NOTE — Progress Notes (Addendum)
Subjective:   Shawna Anderson is a 54 y.o. female who presents for an Initial Medicare Annual Wellness Visit.  Review of Systems    No ROS.  Medicare Wellness Virtual Visit.  Visual/audio telehealth visit, UTA vital signs.   Ht/Wt provided. See social history for additional risk factors.    Cardiac Risk Factors include: hypertension     Objective:    Today's Vitals   10/13/19 0933  Weight: 187 lb (84.8 kg)  Height: 5\' 3"  (1.6 m)   Body mass index is 33.13 kg/m.  Advanced Directives 10/13/2019 09/13/2019 07/29/2019 07/18/2019 07/11/2019 05/20/2019 05/16/2019  Does Patient Have a Medical Advance Directive? Yes No Yes Yes Yes No No  Type of Advance Directive [place]  Does patient want to make changes to medical advance directive? No - Patient declined - - - - - -  Copy of [place] in Chart? No - copy requested - - - - - -  Would patient like information on creating a medical advance directive? - No - Patient declined - - - No - Patient declined No - Patient declined    Current Medications (verified) Outpatient Encounter Medications as of 10/13/2019  Medication Sig  . albuterol (PROVENTIL) (2.5 MG/3ML) 0.083% nebulizer solution Take 3 mLs (2.5 mg total) by nebulization every 6 (six) hours as needed for wheezing or shortness of breath.  . Albuterol Sulfate (PROAIR RESPICLICK) 123XX123 (90 Base) MCG/ACT AEPB Inhale 1 Inhaler into the lungs every 4 (four) hours as needed.  . ALPRAZolam (XANAX) 0.25 MG tablet Take 1 tablet (0.25 mg total) by mouth at bedtime as needed for anxiety or sleep.  Marland Kitchen amLODipine (NORVASC) 10 MG tablet Take 1 tablet (10 mg total) by mouth at bedtime.  Marland Kitchen aspirin EC 81 MG tablet Take 1 tablet (81 mg total) by mouth daily.  . clopidogrel (PLAVIX) 75 MG tablet Take 1 tablet (75 mg total) by mouth daily.  . cyclobenzaprine (FLEXERIL) 5 MG tablet Take 1 tablet (5 mg total) by mouth at bedtime as needed for muscle spasms.    Marland Kitchen dicyclomine (BENTYL) 10 MG capsule Take 1 capsule (10 mg total) by mouth 4 (four) times daily -  before meals and at bedtime.  . divalproex (DEPAKOTE) 500 MG DR tablet Take 1 tablet (500 mg total) by mouth 2 (two) times daily.  Marland Kitchen ezetimibe (ZETIA) 10 MG tablet Take 1 tablet (10 mg total) by mouth daily. With lipitor 80 mg at night  . hyoscyamine (LEVSIN) 0.125 MG tablet Take 1 tablet (0.125 mg total) by mouth every 4 (four) hours. As needed  . ipratropium-albuterol (DUONEB) 0.5-2.5 (3) MG/3ML SOLN Take 3 mLs by nebulization every 6 (six) hours as needed.  . isosorbide mononitrate (IMDUR) 30 MG 24 hr tablet Take 1 tablet (30 mg total) by mouth daily.  Marland Kitchen lidocaine (LIDODERM) 5 % Place 1 patch onto the skin daily. Remove & Discard patch within 12 hours or as directed by MD (Patient taking differently: Place 1 patch onto the skin daily as needed. Remove & Discard patch within 12 hours or as directed by MD)  . nitroGLYCERIN (NITROSTAT) 0.4 MG SL tablet Place 1 tablet (0.4 mg total) under the tongue every 5 (five) minutes as needed for chest pain. X 2 call 911 after 3rd dose  . olmesartan-hydrochlorothiazide (BENICAR HCT) 40-25 MG tablet Take 1 tablet by mouth daily.  . ondansetron (ZOFRAN ODT) 4 MG disintegrating tablet Take 1 tablet (  4 mg total) by mouth every 8 (eight) hours as needed for nausea or vomiting.  . pantoprazole (PROTONIX) 40 MG tablet Take 1 tablet (40 mg total) by mouth 2 (two) times daily. In am before food  . promethazine (PHENERGAN) 25 MG suppository Place 1 suppository (25 mg total) rectally every 6 (six) hours as needed for nausea.  . rifaximin (XIFAXAN) 550 MG TABS tablet Take 550 mg by mouth.  . senna-docusate (SENOKOT-S) 8.6-50 MG tablet Take 1-2 tablets by mouth daily as needed for mild constipation.  . traZODone (DESYREL) 150 MG tablet Take 1 tablet (150 mg total) by mouth at bedtime as needed for sleep.  Marland Kitchen atorvastatin (LIPITOR) 80 MG tablet Take 1 tablet (80 mg total) by  mouth daily at 6 PM.  . dicyclomine (BENTYL) 10 MG capsule Take 1 capsule (10 mg total) by mouth 4 (four) times daily for 7 days.   No facility-administered encounter medications on file as of 10/13/2019.    Allergies (verified) Ativan [lorazepam], Latex, Tape, Xifaxan [rifaximin], and Drixoral allergy sinus [dexbromphen-pse-apap er]   History: Past Medical History:  Diagnosis Date  . Anxiety   . Asthma   . Bipolar 1 disorder ([place])   . Bipolar disorder ([place])   . CAD (coronary artery disease)    s/p stent BMS OM Cx  . Cervical herniated disc 04/12/2016  . COPD (chronic obstructive pulmonary disease) ([place])   . Depression   . Diabetes mellitus without complication ([place])   . Diverticulitis   . GERD (gastroesophageal reflux disease)   . Glaucoma   . History of blood transfusion   . Hyperlipidemia   . Hypertension   . OSA (obstructive sleep apnea)    not using cpap   . Plantar fasciitis    b/l feet s/p steroid shots w/o help and left surgery w/o help   . UTI (urinary tract infection)    Past Surgical History:  Procedure Laterality Date  . ABDOMINAL HYSTERECTOMY    . ABDOMINAL SURGERY  1995   Bowel resection.  Marland Kitchen CARDIAC CATHETERIZATION N/A 04/14/2016   Procedure: Left Heart Cath and Coronary Angiography;  Surgeon: Burnell Blanks, MD;  Location: [facility];  Service: Cardiovascular;  Laterality: N/A;  . CARDIAC SURGERY    . COLONOSCOPY WITH PROPOFOL N/A 07/11/2019   Procedure: COLONOSCOPY WITH PROPOFOL;  Surgeon: Jonathon Bellows, MD;  Location: [facility] ENDOSCOPY;  Service: Gastroenterology;  Laterality: N/A;  . COLONOSCOPY WITH PROPOFOL N/A 07/29/2019   Procedure: COLONOSCOPY WITH PROPOFOL;  Surgeon: Jonathon Bellows, MD;  Location: [facility] ENDOSCOPY;  Service: Gastroenterology;  Laterality: N/A;  . CORONARY ANGIOPLASTY WITH STENT PLACEMENT  2010   Drug eluting stent  . ESOPHAGOGASTRODUODENOSCOPY (EGD) WITH PROPOFOL N/A 07/11/2019   Procedure: ESOPHAGOGASTRODUODENOSCOPY (EGD)  WITH PROPOFOL;  Surgeon: Jonathon Bellows, MD;  Location[facility] END[facility]COPY;  Service: Gastroenterology;  Laterality: N/A;  . OVARIAN CYST REMOVAL     Family History  Problem Relation Age of Onset  . CAD Mother   . Depression Mother   . Heart disease Mother   . Hyperlipidemia Mother   . Hypertension Mother   . CAD Brother   . Depression Brother   . Diabetes Brother   . Heart disease Brother   . Hyperlipidemia Brother   . Heart disease Father   . Alcohol abuse Father    Social History   Socioeconomic History  . Marital status: Single    Spouse name: Not on file  . Number of children: Not on file  .  Years of education: Not on file  . Highest education level: Not on file  Occupational History  . Not on file  Tobacco Use  . Smoking status: Current Every Day Smoker    Packs/day: 0.50    Years: 30.00    Pack years: 15.00    Types: Cigarettes  . Smokeless tobacco: Never Used  . Tobacco comment: refused  Substance and Sexual Activity  . Alcohol use: Not Currently    Comment: once a year  . Drug use: Yes    Frequency: 7.0 times per week    Types: Marijuana  . Sexual activity: Not on file  Other Topics Concern  . Not on file  Social History Narrative   From [place] now living in [place]    1 son    No guns    Wears seat belt   Safe in relationship    Social Determinants of Health   Financial Resource Strain:   . Difficulty of Paying Living Expenses:   Food Insecurity:   . Worried About Charity fundraiser in the Last Year:   . Arboriculturist in the Last Year:   Transportation Needs:   . Film/video editor (Medical):   Marland Kitchen Lack of Transportation (Non-Medical):   Physical Activity:   . Days of Exercise per Week:   . Minutes of Exercise per Session:   Stress:   . Feeling of Stress :   Social Connections:   . Frequency of Communication with Friends and Family:   . Frequency of Social Gatherings with Friends and Family:   . Attends Religious Services:     . Active Member of Clubs or Organizations:   . Attends Archivist Meetings:   Marland Kitchen Marital Status:     Tobacco Counseling Ready to quit: Not Answered Counseling given: Not Answered Comment: refused   Clinical Intake:  Pre-visit preparation completed: Yes        Diabetes: No  How often do you need to have someone help you when you read instructions, pamphlets, or other written materials from your doctor or pharmacy?: 1 - Never  Interpreter Needed?: No      Activities of Daily Living In your present state of health, do you have any difficulty performing the following activities: 10/13/2019 08/18/2019  Hearing? N N  Vision? N N  Difficulty concentrating or making decisions? N N  Walking or climbing stairs? N N  Dressing or bathing? N N  Doing errands, shopping? N N  Preparing Food and eating ? N -  Using the Toilet? N -  In the past six months, have you accidently leaked urine? N -  Do you have problems with loss of bowel control? N -  Managing your Medications? N -  Managing your Finances? N -  Housekeeping or managing your Housekeeping? N -  Some recent data might be hidden     Immunizations and Health Maintenance  There is no immunization history on file for this patient. Health Maintenance Due  Topic Date Due  . TETANUS/TDAP  Never done  . MAMMOGRAM  Never done    Patient Care Team: McLean-Scocuzza, Nino Glow, MD as PCP - General (Internal Medicine) End, Harrell Gave, MD as PCP - Cardiology (Cardiology)  Indicate any recent Medical Services you may have received from other than [facility] providers in the past year (date may be approximate).     Assessment:   This is a routine wellness examination for [facility].  Nurse  connected with patient 10/13/19 at  9:30 AM EDT by a telephone enabled telemedicine application and verified that I am speaking with the correct person using two identifiers. Patient stated full name and DOB. Patient gave permission to  continue with virtual visit. Patient's location was at home and Nurse's location was at [place] office.   Patient is alert and oriented x3. Patient denies difficulty focusing or concentrating. Patient likes to play brain games on her phone.   Health Maintenance Due: -Tdap vaccine- discussed; to be completed with doctor in visit or local pharmacy.   -Mammogram- plans to schedule.  See completed HM at the end of note.   Eye: Visual acuity not assessed. Virtual visit. Followed by their ophthalmologist.  Dental:  Dentures- partial  Hearing: Demonstrates normal hearing during visit.  Safety:  Patient feels safe at home- yes Patient does have smoke detectors at home- yes Patient does wear sunscreen or protective clothing when in direct sunlight - yes Patient does wear seat belt when in a moving vehicle - yes Patient drives- not currently Adequate lighting in walkways free from debris- yes Grab bars and handrails used as appropriate- yes Ambulates with an assistive device- no Cell phone on person when ambulating outside of the home- yes  Social: Alcohol intake - no     Smoking history- current; not ready to quit Illicit drug use? none  Medication: Taking as directed and without issues.  Pill box in use -yes  Self managed - yes   Covid-19: Precautions and sickness symptoms discussed. Wears mask, social distancing, hand hygiene as appropriate.   Activities of Daily Living Patient denies needing assistance with: household chores, feeding themselves, getting from bed to chair, getting to the toilet, bathing/showering, dressing, managing money, or preparing meals.  Son assists as needed.  Discussed the importance of a healthy diet, water intake and the benefits of aerobic exercise.  Physical activity- active at home; no routine.    Diet:  2 Active protein shakes daily, 1 meal. No bloating/emesis with this diet. No fructose. Water: good intake Caffeine: 1-2 cup of  coffee  Other Providers Patient Care Team: McLean-Scocuzza, Nino Glow, MD as PCP - General (Internal Medicine) End, Harrell Gave, MD as PCP - Cardiology (Cardiology)  Hearing/Vision screen  Hearing Screening   125Hz  250Hz  500Hz  1000Hz  2000Hz  3000Hz  4000Hz  6000Hz  8000Hz   Right ear:           Left ear:           Comments: Patient is able to hear conversational tones without difficulty.  No issues reported.  Vision Screening Comments: Wears corrective lenses Visual acuity not assessed, virtual visit.  They have seen their ophthalmologist.     Dietary issues and exercise activities discussed: Current Exercise Habits: The patient does not participate in regular exercise at present  Goals    . Increase physical activity     As tolerated      Depression Screen PHQ 2/9 Scores 10/13/2019 02/18/2019 12/29/2017  PHQ - 2 Score 1 6 0  PHQ- 9 Score - 13 -    Fall Risk Fall Risk  10/13/2019 12/29/2017  Falls in the past year? 0 No  Follow up Falls evaluation completed;Falls prevention discussed -    Timed Get Up and Go Performed no, virtual visit  Cognitive Function:     6CIT Screen 10/13/2019  What Year? 0 points  What month? 0 points  What time? 0 points  Count back from 20 0 points  Months in reverse 0  points  Repeat phrase 0 points  Total Score 0    Screening Tests Health Maintenance  Topic Date Due  . TETANUS/TDAP  Never done  . MAMMOGRAM  Never done  . INFLUENZA VACCINE  02/12/2020  . COLONOSCOPY  07/28/2020  . HIV Screening  Completed  . PAP SMEAR-Modifier  Discontinued      Plan:    Keep all routine maintenance appointments.   Follow up 10/25/19 @ 230.   Medicare Attestation I have personally reviewed: The patient's medical and social history Their use of alcohol, tobacco or illicit drugs Their current medications and supplements The patient's functional ability including ADLs,fall risks, home safety risks, cognitive, and hearing and visual impairment Diet and  physical activities Evidence for depression   I have reviewed and discussed with patient certain preventive protocols, quality metrics, and best practice recommendations.      Varney Biles, LPN   [id number]     Agree  TMS

## 2019-10-25 ENCOUNTER — Telehealth (INDEPENDENT_AMBULATORY_CARE_PROVIDER_SITE_OTHER): Payer: Medicare Other | Admitting: Internal Medicine

## 2019-10-25 ENCOUNTER — Encounter: Payer: Self-pay | Admitting: Internal Medicine

## 2019-10-25 VITALS — BP 104/59 | HR 87 | Ht 63.0 in | Wt 187.0 lb

## 2019-10-25 DIAGNOSIS — N644 Mastodynia: Secondary | ICD-10-CM

## 2019-10-25 DIAGNOSIS — D75839 Thrombocytosis, unspecified: Secondary | ICD-10-CM

## 2019-10-25 DIAGNOSIS — R0789 Other chest pain: Secondary | ICD-10-CM

## 2019-10-25 DIAGNOSIS — I10 Essential (primary) hypertension: Secondary | ICD-10-CM | POA: Diagnosis not present

## 2019-10-25 DIAGNOSIS — R110 Nausea: Secondary | ICD-10-CM

## 2019-10-25 DIAGNOSIS — M1990 Unspecified osteoarthritis, unspecified site: Secondary | ICD-10-CM | POA: Diagnosis not present

## 2019-10-25 DIAGNOSIS — D473 Essential (hemorrhagic) thrombocythemia: Secondary | ICD-10-CM

## 2019-10-25 DIAGNOSIS — M7551 Bursitis of right shoulder: Secondary | ICD-10-CM | POA: Diagnosis not present

## 2019-10-25 MED ORDER — ONDANSETRON 4 MG PO TBDP: 4.0000 mg | ORAL_TABLET | Freq: Three times a day (TID) | ORAL | 1 refills | Status: DC | PRN

## 2019-10-25 MED ORDER — DICLOFENAC SODIUM 1 % EX GEL: 2.0000 g | Freq: Four times a day (QID) | CUTANEOUS | 11 refills | Status: DC

## 2019-10-25 NOTE — Progress Notes (Signed)
Virtual Visit via Video Note  I connected with [facility]  on 10/25/19 at  2:45PM EDT by a video enabled telemedicine application and verified that I am speaking with the correct person using two identifiers.  Location patient: home Location provider:work or home office Persons participating in the virtual visit: patient, provider  I discussed the limitations of evaluation and management by telemedicine and the availability of in person appointments. The patient expressed understanding and agreed to proceed.   HPI: 1. HTN on benicar 40-25 mg qd, norvasc 10 mg qd BP initially elevated and repeat improved  2.CAD with h/o chest pain better since on imdur and not using ntg 3. Elevated plts 11/11/19 seen Dr. Janese Banks and f/u upcoming with labs  4. B/l breast pain and right shoulder bursitis   ROS: See pertinent positives and negatives per HPI.  Past Medical History:  Diagnosis Date  . Anxiety   . Asthma   . Bipolar 1 disorder ([place])   . Bipolar disorder ([place])   . CAD (coronary artery disease)    s/p stent BMS OM Cx  . Cervical herniated disc 04/12/2016  . COPD (chronic obstructive pulmonary disease) ([place])   . Depression   . Diabetes mellitus without complication ([place])   . Diverticulitis   . GERD (gastroesophageal reflux disease)   . Glaucoma   . History of blood transfusion   . Hyperlipidemia   . Hypertension   . OSA (obstructive sleep apnea)    not using cpap   . Plantar fasciitis    b/l feet s/p steroid shots w/o help and left surgery w/o help   . UTI (urinary tract infection)     Past Surgical History:  Procedure Laterality Date  . ABDOMINAL HYSTERECTOMY    . ABDOMINAL SURGERY  1995   Bowel resection.  Marland Kitchen CARDIAC CATHETERIZATION N/A 04/14/2016   Procedure: Left Heart Cath and Coronary Angiography;  Surgeon: Burnell Blanks, MD;  Location: [facility];  Service: Cardiovascular;  Laterality: N/A;  . CARDIAC SURGERY    . COLONOSCOPY WITH PROPOFOL N/A  07/11/2019   Procedure: COLONOSCOPY WITH PROPOFOL;  Surgeon: Jonathon Bellows, MD;  Location: [facility] ENDOSCOPY;  Service: Gastroenterology;  Laterality: N/A;  . COLONOSCOPY WITH PROPOFOL N/A 07/29/2019   Procedure: COLONOSCOPY WITH PROPOFOL;  Surgeon: Jonathon Bellows, MD;  Location: [facility] ENDOSCOPY;  Service: Gastroenterology;  Laterality: N/A;  . CORONARY ANGIOPLASTY WITH STENT PLACEMENT  2010   Drug eluting stent  . ESOPHAGOGASTRODUODENOSCOPY (EGD) WITH PROPOFOL N/A 07/11/2019   Procedure: ESOPHAGOGASTRODUODENOSCOPY (EGD) WITH PROPOFOL;  Surgeon: Jonathon Bellows, MD;  Location: [facility] ENDOSCOPY;  Service: Gastroenterology;  Laterality: N/A;  . OVARIAN CYST REMOVAL      Family History  Problem Relation Age of Onset  . CAD Mother   . Depression Mother   . Heart disease Mother   . Hyperlipidemia Mother   . Hypertension Mother   . CAD Brother   . Depression Brother   . Diabetes Brother   . Heart disease Brother   . Hyperlipidemia Brother   . Heart disease Father   . Alcohol abuse Father   . Lupus Other   . Sickle cell anemia Other     SOCIAL HX:  From [place] now living in [place]  1 son  No guns  Wears seat belt Safe in relationship    Current Outpatient Medications:  .  Albuterol Sulfate (PROAIR RESPICLICK) 123XX123 (90 Base) MCG/ACT AEPB, Inhale 1 Inhaler into the lungs every 4 (four) hours  as needed., Disp: 3 each, Rfl: 4 .  ALPRAZolam (XANAX) 0.25 MG tablet, Take 1 tablet (0.25 mg total) by mouth at bedtime as needed for anxiety or sleep., Disp: 30 tablet, Rfl: 2 .  amLODipine (NORVASC) 10 MG tablet, Take 1 tablet (10 mg total) by mouth at bedtime., Disp: 90 tablet, Rfl: 3 .  aspirin EC 81 MG tablet, Take 1 tablet (81 mg total) by mouth daily., Disp: 90 tablet, Rfl: 3 .  atorvastatin (LIPITOR) 80 MG tablet, Take 1 tablet (80 mg total) by mouth daily at 6 PM., Disp: 90 tablet, Rfl: 3 .  clopidogrel (PLAVIX) 75 MG tablet, Take 1 tablet (75 mg total) by mouth daily., Disp: 90 tablet,  Rfl: 3 .  cyclobenzaprine (FLEXERIL) 5 MG tablet, Take 1 tablet (5 mg total) by mouth at bedtime as needed for muscle spasms., Disp: 30 tablet, Rfl: 0 .  dicyclomine (BENTYL) 10 MG capsule, Take 1 capsule (10 mg total) by mouth 4 (four) times daily -  before meals and at bedtime., Disp: 120 capsule, Rfl: 2 .  divalproex (DEPAKOTE) 500 MG DR tablet, Take 1 tablet (500 mg total) by mouth 2 (two) times daily., Disp: 180 tablet, Rfl: 1 .  ezetimibe (ZETIA) 10 MG tablet, Take 1 tablet (10 mg total) by mouth daily. With lipitor 80 mg at night, Disp: 90 tablet, Rfl: 3 .  hyoscyamine (LEVSIN) 0.125 MG tablet, Take 1 tablet (0.125 mg total) by mouth every 4 (four) hours. As needed, Disp: 180 tablet, Rfl: 5 .  ipratropium-albuterol (DUONEB) 0.5-2.5 (3) MG/3ML SOLN, Take 3 mLs by nebulization every 6 (six) hours as needed., Disp: 360 mL, Rfl: 11 .  isosorbide mononitrate (IMDUR) 30 MG 24 hr tablet, Take 1 tablet (30 mg total) by mouth daily., Disp: 90 tablet, Rfl: 0 .  olmesartan-hydrochlorothiazide (BENICAR HCT) 40-25 MG tablet, Take 1 tablet by mouth daily., Disp: 90 tablet, Rfl: 3 .  ondansetron (ZOFRAN ODT) 4 MG disintegrating tablet, Take 1 tablet (4 mg total) by mouth every 8 (eight) hours as needed for nausea or vomiting., Disp: 12 tablet, Rfl: 0 .  pantoprazole (PROTONIX) 40 MG tablet, Take 1 tablet (40 mg total) by mouth 2 (two) times daily. In am before food, Disp: 60 tablet, Rfl: 5 .  promethazine (PHENERGAN) 25 MG suppository, Place 1 suppository (25 mg total) rectally every 6 (six) hours as needed for nausea., Disp: 12 suppository, Rfl: 1 .  senna-docusate (SENOKOT-S) 8.6-50 MG tablet, Take 1-2 tablets by mouth daily as needed for mild constipation., Disp: 90 tablet, Rfl: 1 .  traZODone (DESYREL) 150 MG tablet, Take 1 tablet (150 mg total) by mouth at bedtime as needed for sleep., Disp: 90 tablet, Rfl: 3 .  albuterol (PROVENTIL) (2.5 MG/3ML) 0.083% nebulizer solution, Take 3 mLs (2.5 mg total) by  nebulization every 6 (six) hours as needed for wheezing or shortness of breath. (Patient not taking: Reported on 10/25/2019), Disp: 360 mL, Rfl: 12 .  diclofenac Sodium (VOLTAREN) 1 % GEL, Apply 2 g topically 4 (four) times daily. Prn OA and chest wall pain, Disp: 150 g, Rfl: 11 .  dicyclomine (BENTYL) 10 MG capsule, Take 1 capsule (10 mg total) by mouth 4 (four) times daily for 7 days., Disp: 28 capsule, Rfl: 0 .  lidocaine (LIDODERM) 5 %, Place 1 patch onto the skin daily. Remove & Discard patch within 12 hours or as directed by MD (Patient not taking: Reported on 10/25/2019), Disp: 30 patch, Rfl: 0 .  nitroGLYCERIN (NITROSTAT) 0.4 MG  SL tablet, Place 1 tablet (0.4 mg total) under the tongue every 5 (five) minutes as needed for chest pain. X 2 call 911 after 3rd dose (Patient not taking: Reported on 10/25/2019), Disp: 30 tablet, Rfl: 5 .  rifaximin (XIFAXAN) 550 MG TABS tablet, Take 550 mg by mouth., Disp: , Rfl:   EXAM:  VITALS per patient if applicable:  GENERAL: alert, oriented, appears well and in no acute distress  PSYCH/NEURO: pleasant and cooperative, no obvious depression or anxiety, speech and thought processing grossly intact  ASSESSMENT AND PLAN:  Discussed the following assessment and plan:  Essential hypertension Cont meds   Chest wall pain - Plan: Ambulatory referral to Orthopedic Surgery Dr. Roland Rack  Osteoarthritis, unspecified osteoarthritis type, unspecified site - Plan: diclofenac Sodium (VOLTAREN) 1 % GEL  Chronic bursitis of right shoulder - Plan: Ambulatory referral to Orthopedic Surgery  Breast pain - Plan: MM DIAG BREAST TOMO BILATERAL, US BREAST LTD UNI RIGHT INC AXILLA, US BREAST LTD UNI LEFT INC AXILLA Prn voltaren   Thrombocytosis ([place]) F/u Dr. Janese Banks   Chronic nausea Prn zofran  HM Declines flu shot  Tdapwill do in future Consider pna 23, shingrix and in future if has not had  Will disc covid 19 vx in future   S/p hysterectomy will ask at f/u if  had abnormal pap? If left ovary still intact right ovary appears out per imaging  -established with [facility]  Dr. Vicente Males Kirancolonoscopy had 07/11/19 and 1/15/21and EGD had 07/11/19 with path no malignancy concern  H/o sigmoid resection in past for h/o diverticulitis GI seen 09/14/19   -referredmammogramhas not scheduled encouraged to schedule  rec smoking cessation smoking < or = 0.5 ppd also using THC  -we discussed possible serious and likely etiologies, options for evaluation and workup, limitations of telemedicine visit vs in person visit, treatment, treatment risks and precautions. Pt prefers to treat via telemedicine empirically rather then risking or undertaking an in person visit at this moment. Patient agrees to seek prompt in person care if worsening, new symptoms arise, or if is not improving with treatment.   I discussed the assessment and treatment plan with the patient. The patient was provided an opportunity to ask questions and all were answered. The patient agreed with the plan and demonstrated an understanding of the instructions.   The patient was advised to call back or seek an in-person evaluation if the symptoms worsen or if the condition fails to improve as anticipated.  Time spent 20 minutes  Delorise Jackson, MD

## 2019-10-27 ENCOUNTER — Other Ambulatory Visit: Payer: Self-pay | Admitting: Internal Medicine

## 2019-10-27 DIAGNOSIS — M1990 Unspecified osteoarthritis, unspecified site: Secondary | ICD-10-CM

## 2019-10-27 MED ORDER — DICLOFENAC SODIUM 1 % EX GEL: 2.0000 g | Freq: Four times a day (QID) | CUTANEOUS | 3 refills | Status: DC

## 2019-10-28 ENCOUNTER — Telehealth: Payer: Self-pay | Admitting: Internal Medicine

## 2019-10-28 ENCOUNTER — Other Ambulatory Visit: Payer: Self-pay | Admitting: Internal Medicine

## 2019-10-28 DIAGNOSIS — F419 Anxiety disorder, unspecified: Secondary | ICD-10-CM

## 2019-10-28 DIAGNOSIS — G4700 Insomnia, unspecified: Secondary | ICD-10-CM

## 2019-10-28 MED ORDER — ALPRAZOLAM 0.25 MG PO TABS: 0.2500 mg | ORAL_TABLET | Freq: Every evening | ORAL | 2 refills | Status: DC | PRN

## 2019-10-28 NOTE — Telephone Encounter (Signed)
RX refill on Lidocaine 5% patch faxed To Gray[facility] Sessions [phone] Per provider she can get this OTC

## 2019-11-01 ENCOUNTER — Telehealth: Payer: Self-pay

## 2019-11-01 ENCOUNTER — Telehealth: Payer: Self-pay | Admitting: Internal Medicine

## 2019-11-01 NOTE — Telephone Encounter (Signed)
Attempted to schedule.  LMOV to call office.  ° °

## 2019-11-01 NOTE — Telephone Encounter (Signed)
Left message requesting to have patient call back to schedule overdue 2-3 week F/U with Dr. Saunders Revel or Lorenso Quarry, PA-C.

## 2019-11-01 NOTE — Telephone Encounter (Signed)
-----   Message from Horton Finer sent at 11/01/2019  8:56 AM EDT ----- Regarding: appointment Please schedule overdue F/U appointment. Thank you!

## 2019-11-03 ENCOUNTER — Other Ambulatory Visit: Payer: Self-pay

## 2019-11-03 MED ORDER — ISOSORBIDE MONONITRATE ER 30 MG PO TB24: 30.0000 mg | ORAL_TABLET | Freq: Every day | ORAL | 0 refills | Status: DC

## 2019-11-08 ENCOUNTER — Other Ambulatory Visit: Payer: Self-pay | Admitting: Internal Medicine

## 2019-11-08 DIAGNOSIS — F319 Bipolar disorder, unspecified: Secondary | ICD-10-CM

## 2019-11-08 MED ORDER — DIVALPROEX SODIUM 500 MG PO DR TAB: 500.0000 mg | DELAYED_RELEASE_TABLET | Freq: Two times a day (BID) | ORAL | 1 refills | Status: DC

## 2019-11-09 ENCOUNTER — Ambulatory Visit: Payer: Medicare Other | Admitting: Internal Medicine

## 2019-11-09 NOTE — Telephone Encounter (Signed)
Patient not home .  Will attempt to contact another time .

## 2019-11-10 ENCOUNTER — Other Ambulatory Visit: Payer: Self-pay

## 2019-11-11 ENCOUNTER — Inpatient Hospital Stay: Payer: Medicare Other | Attending: Oncology

## 2019-11-11 DIAGNOSIS — D72829 Elevated white blood cell count, unspecified: Secondary | ICD-10-CM | POA: Insufficient documentation

## 2019-11-11 LAB — CBC WITH DIFFERENTIAL/PLATELET
Abs Immature Granulocytes: 0.05 10*3/uL (ref 0.00–0.07)
Basophils Absolute: 0.1 10*3/uL (ref 0.0–0.1)
Basophils Relative: 1 %
Eosinophils Absolute: 0.3 10*3/uL (ref 0.0–0.5)
Eosinophils Relative: 3 %
HCT: 33.1 % — ABNORMAL LOW (ref 36.0–46.0)
Hemoglobin: 10.9 g/dL — ABNORMAL LOW (ref 12.0–15.0)
Immature Granulocytes: 0 %
Lymphocytes Relative: 28 %
Lymphs Abs: 3.7 10*3/uL (ref 0.7–4.0)
MCH: 29.5 pg (ref 26.0–34.0)
MCHC: 32.9 g/dL (ref 30.0–36.0)
MCV: 89.7 fL (ref 80.0–100.0)
Monocytes Absolute: 0.9 10*3/uL (ref 0.1–1.0)
Monocytes Relative: 7 %
Neutro Abs: 8.1 10*3/uL — ABNORMAL HIGH (ref 1.7–7.7)
Neutrophils Relative %: 61 %
Platelets: 357 10*3/uL (ref 150–400)
RBC: 3.69 MIL/uL — ABNORMAL LOW (ref 3.87–5.11)
RDW: 14.1 % (ref 11.5–15.5)
WBC: 13.1 10*3/uL — ABNORMAL HIGH (ref 4.0–10.5)
nRBC: 0 % (ref 0.0–0.2)

## 2019-11-14 NOTE — Telephone Encounter (Signed)
Scheduled

## 2019-11-15 ENCOUNTER — Ambulatory Visit (INDEPENDENT_AMBULATORY_CARE_PROVIDER_SITE_OTHER): Payer: Medicare Other | Admitting: Internal Medicine

## 2019-11-15 ENCOUNTER — Encounter: Payer: Self-pay | Admitting: Internal Medicine

## 2019-11-15 ENCOUNTER — Other Ambulatory Visit: Payer: Self-pay

## 2019-11-15 ENCOUNTER — Other Ambulatory Visit (HOSPITAL_COMMUNITY)
Admission: RE | Admit: 2019-11-15 | Discharge: 2019-11-15 | Disposition: A | Discharge status: Home / Self Care | Payer: Medicare Other | Source: Ambulatory Visit | Attending: Internal Medicine | Admitting: Internal Medicine

## 2019-11-15 VITALS — BP 124/70 | HR 99 | Temp 97.8°F | Ht 63.0 in | Wt 178.2 lb

## 2019-11-15 DIAGNOSIS — R748 Abnormal levels of other serum enzymes: Secondary | ICD-10-CM | POA: Diagnosis not present

## 2019-11-15 DIAGNOSIS — D649 Anemia, unspecified: Secondary | ICD-10-CM | POA: Insufficient documentation

## 2019-11-15 DIAGNOSIS — E559 Vitamin D deficiency, unspecified: Secondary | ICD-10-CM

## 2019-11-15 DIAGNOSIS — R7303 Prediabetes: Secondary | ICD-10-CM | POA: Diagnosis not present

## 2019-11-15 DIAGNOSIS — I10 Essential (primary) hypertension: Secondary | ICD-10-CM

## 2019-11-15 DIAGNOSIS — E876 Hypokalemia: Secondary | ICD-10-CM | POA: Diagnosis not present

## 2019-11-15 DIAGNOSIS — Z113 Encounter for screening for infections with a predominantly sexual mode of transmission: Secondary | ICD-10-CM | POA: Insufficient documentation

## 2019-11-15 DIAGNOSIS — Z202 Contact with and (suspected) exposure to infections with a predominantly sexual mode of transmission: Secondary | ICD-10-CM | POA: Diagnosis not present

## 2019-11-15 DIAGNOSIS — E1143 Type 2 diabetes mellitus with diabetic autonomic (poly)neuropathy: Secondary | ICD-10-CM | POA: Insufficient documentation

## 2019-11-15 DIAGNOSIS — D72829 Elevated white blood cell count, unspecified: Secondary | ICD-10-CM

## 2019-11-15 MED ORDER — AZITHROMYCIN 500 MG PO TABS: ORAL_TABLET | ORAL | 0 refills | Status: DC

## 2019-11-15 MED ORDER — METRONIDAZOLE 500 MG PO TABS: 500.0000 mg | ORAL_TABLET | Freq: Two times a day (BID) | ORAL | 0 refills | Status: DC

## 2019-11-15 MED ORDER — DOXYCYCLINE HYCLATE 100 MG PO TABS: 100.0000 mg | ORAL_TABLET | Freq: Two times a day (BID) | ORAL | 0 refills | Status: DC

## 2019-11-15 NOTE — Progress Notes (Signed)
Chief Complaint  Patient presents with  . Exposure to STD    Pt recently became sexually active for first time in 5 years. Partner went to be tested today. Shawna Anderson had broke and then pt started having itching, burning, white discharge a few days later.  Patient would like full panel STD testing and to check for yeast along with BV.   . Vaginitis    pt though she had a yeast infection and has been using the Monostat 7 day OTC. This did not help.   . NGU    Patient's partner has a history of Non-Gonococcal Urethritis (NGU). Unsure if this is the cause as this was said to be a while before him and the patient interacted.    STD check  1. New partner no sex in 53 years and partner just tx'ed for NGU infection she states condom recently broke and started with itching vaginal irritation and burning and white discharge tried monistat 7 otc last used 11/13/19 and she c/o lowre ab pain   2. Elevated lfts will repeat today  3. Leukocytosis/anemia due to f/u with Dr. Janese Banks 4. HTN on benicar 20-25 norvasc 10 mg improved   Review of Systems  Constitutional: Negative for weight loss.  HENT: Negative for hearing loss.   Eyes: Negative for blurred vision.  Respiratory: Negative for shortness of breath.   Cardiovascular: Negative for chest pain.  Gastrointestinal: Negative for abdominal pain.  Musculoskeletal: Negative for falls.  Skin: Negative for rash.  Neurological: Negative for headaches.  Psychiatric/Behavioral: Negative for depression.   Past Medical History:  Diagnosis Date  . Anxiety   . Asthma   . Bipolar 1 disorder ([employer])   . Bipolar disorder ([place])   . CAD (coronary artery disease)    s/p stent BMS OM Cx  . Cervical herniated disc 04/12/2016  . COPD (chronic obstructive pulmonary disease) ([place])   . Depression   . Diabetes mellitus without complication ([place])   . Diverticulitis   . GERD (gastroesophageal reflux disease)   . Glaucoma   . History of blood transfusion   . Hyperlipidemia    . Hypertension   . OSA (obstructive sleep apnea)    not using cpap   . Plantar fasciitis    b/l feet s/p steroid shots w/o help and left surgery w/o help   . UTI (urinary tract infection)    Past Surgical History:  Procedure Laterality Date  . ABDOMINAL HYSTERECTOMY    . ABDOMINAL SURGERY  1995   Bowel resection.  Marland Kitchen CARDIAC CATHETERIZATION N/A 04/14/2016   Procedure: Left Heart Cath and Coronary Angiography;  Surgeon: Burnell Blanks, MD;  Location: [facility];  Service: Cardiovascular;  Laterality: N/A;  . CARDIAC SURGERY    . COLONOSCOPY WITH PROPOFOL N/A 07/11/2019   Procedure: COLONOSCOPY WITH PROPOFOL;  Surgeon: Jonathon Bellows, MD;  Location: [facility] ENDOSCOPY;  Service: Gastroenterology;  Laterality: N/A;  . COLONOSCOPY WITH PROPOFOL N/A 07/29/2019   Procedure: COLONOSCOPY WITH PROPOFOL;  Surgeon: Jonathon Bellows, MD;  Location: [facility] ENDOSCOPY;  Service: Gastroenterology;  Laterality: N/A;  . CORONARY ANGIOPLASTY WITH STENT PLACEMENT  2010   Drug eluting stent  . ESOPHAGOGASTRODUODENOSCOPY (EGD) WITH PROPOFOL N/A 07/11/2019   Procedure: ESOPHAGOGASTRODUODENOSCOPY (EGD) WITH PROPOFOL;  Surgeon: Jonathon Bellows, MD;  Location: [facility] ENDOSCOPY;  Service: Gastroenterology;  Laterality: N/A;  . OVARIAN CYST REMOVAL     Family History  Problem Relation Age of Onset  . CAD Mother   . Depression Mother   .  Heart disease Mother   . Hyperlipidemia Mother   . Hypertension Mother   . CAD Brother   . Depression Brother   . Diabetes Brother   . Heart disease Brother   . Hyperlipidemia Brother   . Heart disease Father   . Alcohol abuse Father   . Lupus Other   . Sickle cell anemia Other    Social History   Socioeconomic History  . Marital status: Single    Spouse name: Not on file  . Number of children: Not on file  . Years of education: Not on file  . Highest education level: Not on file  Occupational History  . Not on file  Tobacco Use  . Smoking status: Current Every  Day Smoker    Packs/day: 0.50    Years: 30.00    Pack years: 15.00    Types: Cigarettes  . Smokeless tobacco: Never Used  . Tobacco comment: refused  Substance and Sexual Activity  . Alcohol use: Not Currently    Comment: once a year  . Drug use: Yes    Frequency: 7.0 times per week    Types: Marijuana  . Sexual activity: Not on file  Other Topics Concern  . Not on file  Social History Narrative   From [place] now living in [place]    1 son    No guns    Wears seat belt   Safe in relationship    Social Determinants of Health   Financial Resource Strain:   . Difficulty of Paying Living Expenses:   Food Insecurity:   . Worried About Charity fundraiser in the Last Year:   . Arboriculturist in the Last Year:   Transportation Needs:   . Film/video editor (Medical):   Marland Kitchen Lack of Transportation (Non-Medical):   Physical Activity:   . Days of Exercise per Week:   . Minutes of Exercise per Session:   Stress:   . Feeling of Stress :   Social Connections:   . Frequency of Communication with Friends and Family:   . Frequency of Social Gatherings with Friends and Family:   . Attends Religious Services:   . Active Member of Clubs or Organizations:   . Attends Archivist Meetings:   Marland Kitchen Marital Status:   Intimate Partner Violence:   . Fear of Current or Ex-Partner:   . Emotionally Abused:   Marland Kitchen Physically Abused:   . Sexually Abused:    Current Meds  Medication Sig  . albuterol (PROVENTIL) (2.5 MG/3ML) 0.083% nebulizer solution Take 3 mLs (2.5 mg total) by nebulization every 6 (six) hours as needed for wheezing or shortness of breath.  . Albuterol Sulfate (PROAIR RESPICLICK) 325 (90 Base) MCG/ACT AEPB Inhale 1 Inhaler into the lungs every 4 (four) hours as needed.  . ALPRAZolam (XANAX) 0.25 MG tablet Take 1 tablet (0.25 mg total) by mouth at bedtime as needed for anxiety or sleep.  Marland Kitchen amLODipine (NORVASC) 10 MG tablet Take 1 tablet (10 mg total) by  mouth at bedtime.  Marland Kitchen aspirin EC 81 MG tablet Take 1 tablet (81 mg total) by mouth daily.  . clopidogrel (PLAVIX) 75 MG tablet Take 1 tablet (75 mg total) by mouth daily.  . cyclobenzaprine (FLEXERIL) 5 MG tablet Take 1 tablet (5 mg total) by mouth at bedtime as needed for muscle spasms.  . diclofenac Sodium (VOLTAREN) 1 % GEL Apply 2 g topically 4 (four) times daily. Prn OA and  chest wall pain  . dicyclomine (BENTYL) 10 MG capsule Take 1 capsule (10 mg total) by mouth 4 (four) times daily -  before meals and at bedtime.  . divalproex (DEPAKOTE) 500 MG DR tablet Take 1 tablet (500 mg total) by mouth 2 (two) times daily.  Marland Kitchen ezetimibe (ZETIA) 10 MG tablet Take 1 tablet (10 mg total) by mouth daily. With lipitor 80 mg at night  . hyoscyamine (LEVSIN) 0.125 MG tablet Take 1 tablet (0.125 mg total) by mouth every 4 (four) hours. As needed  . ipratropium-albuterol (DUONEB) 0.5-2.5 (3) MG/3ML SOLN Take 3 mLs by nebulization every 6 (six) hours as needed.  . isosorbide mononitrate (IMDUR) 30 MG 24 hr tablet Take 1 tablet (30 mg total) by mouth daily.  Marland Kitchen lidocaine (LIDODERM) 5 % Place 1 patch onto the skin daily. Remove & Discard patch within 12 hours or as directed by MD  . nitroGLYCERIN (NITROSTAT) 0.4 MG SL tablet Place 1 tablet (0.4 mg total) under the tongue every 5 (five) minutes as needed for chest pain. X 2 call 911 after 3rd dose  . olmesartan-hydrochlorothiazide (BENICAR HCT) 40-25 MG tablet Take 1 tablet by mouth daily.  . ondansetron (ZOFRAN ODT) 4 MG disintegrating tablet Take 1 tablet (4 mg total) by mouth every 8 (eight) hours as needed for nausea or vomiting.  . promethazine (PHENERGAN) 25 MG suppository Place 1 suppository (25 mg total) rectally every 6 (six) hours as needed for nausea.  . rifaximin (XIFAXAN) 550 MG TABS tablet Take 550 mg by mouth.  . senna-docusate (SENOKOT-S) 8.6-50 MG tablet Take 1-2 tablets by mouth daily as needed for mild constipation.  . traZODone (DESYREL) 150 MG  tablet Take 1 tablet (150 mg total) by mouth at bedtime as needed for sleep.   Allergies  Allergen Reactions  . Ativan [Lorazepam]     Pt states it makes her tongue do weird things   . Latex Other (See Comments)    Patient stated that she was told by her doctor that she is "allergic to" this  . Tape Other (See Comments)    Patient stated that she was told by her doctor that she is "allergic to" this  . Xifaxan [Rifaximin]     Pt believes this is contributing to her abdominal pain/discomfort  . Drixoral Allergy Sinus [Dexbromphen-Pse-Apap Er] Rash   Recent Results (from the past 2160 hour(s))  Jak2 w/reflex to CALR/MPL     Status: None   Collection Time: 09/13/19 11:12 AM  Result Value Ref Range   JAK2 GenotypR Comment     Comment: (NOTE) Result: NEGATIVE for the JAK2 V617F mutation. Interpretation:  The G to T nucleotide change encoding the V617F mutation was not detected.  This result does not rule out the presence of the JAK2 mutation at a level below the sensitivity of detection of this assay, or the presence of other mutations within JAK2 not detected by this assay.  This result does not rule out a diagnosis of polycythemia vera, essential thrombocythemia or idiopathic myelofibrosis as the V617F mutation is not detected in all patients with these disorders.    BACKGROUND: Comment     Comment: (NOTE) JAK2 is a cytoplasmic tyrosine kinase with a key role in signal transduction from multiple hematopoietic growth factor receptors. A point mutation within exon 14 of the JAK2 gene ([id number]) encoding a valine to phenylalanine substitution at position 617 of the JAK2 protein (V617F) has been identified in most patients with polycythemia vera, and in about  half of those with either essential thrombocythemia or idiopathic myelofibrosis. The V617F has also been detected, although infrequently, in other myeloid disorders such as chronic myelomonocytic leukemia and chronic neutrophilic  luekemia. V617F is an acquired mutation that alters a highly conserved valine present in the negative regulatory JH2 domain of the JAK2 protein and is predicted to dysregulate kinase activity. Methodology: Total genomic DNA was extracted and subjected to TaqMan real-time PCR amplification/detection. Two amplification products per sample were monitored by real-time PCR using primers/probes s pecific to JAK2 wild type (WT) and JAK2 mutant V617F. The ABI7900 Absolute Quantitation software will compare the patient specimen valuse to the standard curves and generate percent values for wild type and mutant type. In vitro studies have indicated that this assay has an analytical sensitivity of 1%. References: Baxter EJ, Scott Phineas Real, et al. Acquired mutation of the tyrosine kinase JAK2 in human myeloproliferative disorders. Lancet. 2005 Mar 19-25; 365(9464):1054-1061. Alfonso Ramus Couedic JP. A unique clonal JAK2 mutation leading to constitutive signaling causes polycythaemia vera. Nature. 2005 Apr 28; 434(7037):1144-1148. Kralovics R, Passamonti F, Buser AS, et al. A gain-of-function mutation of JAK2 in myeloproliferative disorders. N Engl J Med. 2005 Apr 28; 352(17):1779-1790.    Director Review, JAK2 Comment     Comment: (NOTE) Loni Muse, PhD, [facility]    Director, [place] for [place], [place] [address]    [phone] This test was developed and its performance characteristics determined by [facility]. It has not been cleared or approved by the Food and Drug Administration.    Reflex: Comment     Comment: (NOTE) Reflex to CALR Mutation Analysis and MPL Mutation Analysis is indicated. Performed At: [facility] [address] [place], [place] [id number] Katina Degree MDPhD [id number] Performed At: [place], [place] [id number] Katina Degree MDPhD [id number]    Iron and TIBC     Status: None   Collection Time: 09/13/19 11:12 AM  Result Value Ref Range   Iron 63 28 - 170 ug/dL   TIBC 382 250 - 450 ug/dL   Saturation Ratios 17 10.4 - 31.8 %   UIBC 319 ug/dL    Comment: Performed at [facility], [address] [place], [place] [address]  Ferritin     Status: None   Collection Time: 09/13/19 11:12 AM  Result Value Ref Range   Ferritin 70 11 - 307 ng/mL    Comment: Performed at [facility], [address] [place],  [address]  BCR-ABL1 FISH     Status: None   Collection Time: 09/13/19 11:12 AM  Result Value Ref Range   Specimen Type BLOOD    Cells Counted 200    Cells Analyzed 200    FISH Result Comment:     Comment: NORMAL:  NO BCR OR ABL1 GENE REARRANGEMENT OBSERVED   Interpretation Comment:     Comment: (NOTE)             nuc ish 9q34(ASS1,ABL1)x2,22q11.2(BCRx2)[200].      The fluorescence in situ hybridization (FISH) study was normal. FISH, using unique sequence DNA probes for the ABL1 and BCR gene regions showed two ABL1 signals (red), two control ASS1 gene signals (aqua) located adjacent to the ABL1 locus at 9q34, and two BCR signals (green) at 22q11.2 in all interphase nuclei examined. There was NO evidence of CML or ALL-associated BCR/ABL1 dual fusion  signals in this analysis. .      This analysis is limited to abnormalities detectable by the specific probes included in the study. FISH results should be interpreted within the context of a full cytogenetic analysis and pathology evaluation. .      This test was developed and its performance characteristics determined by [facility]). It has not been cleared or approved by the U.S. Food and Drug Administration. A BCR-ABL1 gene fusion in greater than 3 interphase nuclei in a  patient with a new clinical diagnosis is considered positive. The DNA probe vendor for this study was [employer], research (physical sciences)).     Director Review: Comment:     Comment: (NOTE) Chandra Batch, PhD, [facility] Performed At: [facility] [place], [place] [id number] Katina Degree MDPhD [id number] Performed At: [facility] [place], [place] [id number] Rush Farmer MD [id number]   Flow cytometry panel-leukemia/lymphoma work-up     Status: None   Collection Time: 09/13/19 11:12 AM  Result Value Ref Range   PATH INTERP XXX-IMP Comment     Comment: (NOTE) No significant immunophenotypic abnormality detected Lymphocytosis    ANNOTATION COMMENT IMP Comment     Comment: Correlation with pending molecular testing results is recommended.   CLINICAL INFO Comment     Comment: (NOTE) Accompanying CBC dated 09/13/2019 shows: WBC count 11.8, Neu 6.6, Lym 4.1, Mon 0.8.    Specimen Type Comment     Comment: Peripheral blood   ASSESSMENT OF LEUKOCYTES Comment     Comment: (NOTE) No monoclonal B cell population is detected. kappa:lambda ratio 1.1 An absolute increase in T-cells is detected. There is no loss of, or aberrant expression of, the pan T cell antigens to suggest a neoplastic T cell process. CD4:CD8 ratio 4.7 No circulating blasts are detected. Rare granulocytes show left-shifted maturation.There is no immunophenotypic evidence of abnormal myeloid maturation. Analysis of the lymphocyte population shows: B cells 16%, T cells 82%, NK cells 2%.    % Viable Cells Comment     Comment: (NOTE) 76% Cell viability in this sample is sufficient for analysis but is less than optimal.    ANALYSIS AND GATING STRATEGY Comment     Comment: 8 color analysis with CD45/SSC gating   IMMUNOPHENOTYPING STUDY Comment     Comment: (NOTE) CD2       Normal         CD3       Normal CD4       Normal         CD5       Normal CD7       Normal         CD8       Normal CD10      Normal         CD11b     Normal CD13      Normal         CD14      Normal CD16      Normal         CD19       Normal CD20      Normal         CD33      Normal CD34      Normal         CD38      Normal CD45      Normal  CD56      Normal CD57      Normal         CD117     Normal HLA-DR    Normal         KAPPA     Normal LAMBDA    Normal         CD64      Normal    PATHOLOGIST NAME Comment     Comment: Henrietta Hoover, M.D.   COMMENT: Comment     Comment: (NOTE) Each antibody in this assay was utilized to assess for potential abnormalities of studied cell populations or to characterize identified abnormalities. This test was developed and its performance characteristics determined by [facility].  It has not been cleared or approved by the U.S. Food and Drug Administration. The FDA has determined that such clearance or approval is not necessary. This test is used for clinical purposes.  It should not be regarded as investigational or for research. Performed At: -[facility] [address] [place], [place] [id number] Katina Degree MDPhD [id number]:[id number] Performed At: [facility] [address] [place], [place] [id number] Katina Degree MDPhD [id number]   CBC with Differential/Platelet     Status: Abnormal   Collection Time: 09/13/19 11:12 AM  Result Value Ref Range   WBC 11.8 (H) 4.0 - 10.5 K/uL   RBC 4.01 3.87 - 5.11 MIL/uL   Hemoglobin 11.7 (L) 12.0 - 15.0 g/dL   HCT 36.0 36.0 - 46.0 %   MCV 89.8 80.0 - 100.0 fL   MCH 29.2 26.0 - 34.0 pg   MCHC 32.5 30.0 - 36.0 g/dL   RDW 13.2 11.5 - 15.5 %   Platelets 370 150 - 400 K/uL   nRBC 0.0 0.0 - 0.2 %   Neutrophils Relative % 56 %   Neutro Abs 6.6 1.7 - 7.7 K/uL   Lymphocytes Relative 34 %   Lymphs Abs 4.1 (H) 0.7 - 4.0 K/uL   Monocytes Relative 7 %   Monocytes Absolute 0.8 0.1 - 1.0 K/uL   Eosinophils Relative 2 %   Eosinophils Absolute 0.2 0.0 - 0.5 K/uL   Basophils Relative 1 %   Basophils Absolute 0.1 0.0 - 0.1 K/uL   Immature Granulocytes 0 %   Abs Immature Granulocytes 0.05 0.00 - 0.07 K/uL    Comment: Performed at [facility], [address], [place], [place] [address]  Technologist smear review     Status: None   Collection Time: 09/13/19 11:12 AM  Result Value Ref Range   WBC Morphology MORPHOLOGY UNREMARKABLE    RBC Morphology RBC MORPHOLOGY NORMAL    Tech Review Normal platelet morphology     Comment: PLATELETS APPEAR ADEQUATE Performed at [facility], [address] [place], [place] [address]   CALR + MPL (reflexed)     Status: None   Collection Time: 09/13/19 11:12 AM  Result Value Ref Range   CALR Mutation Detection Result Comment     Comment: (NOTE) NEGATIVE No insertions or deletions were detected within the analyzed region of the calreticulin (CALR) gene. A negative result does not entirely exclude the possibility of a clonal population carrying CALR gene mutations that are not covered by this assay. Results should be interpreted in conjunction with clinical and laboratory findings for the most accurate interpretation.    Background: Comment     Comment: (NOTE) The calcium-binding endoplasmic reticulin chaperone protein, calreticulin (CALR), is somatically mutated in approximately 70% of  patients with JAK2-negative essential thrombocythemia (ET) and 60- 88% of patients with JAK2-negative primary myelofibrosis(PMF). Only a minority of patients (approximately 8%) with myelodysplasia have mutations in  CALR gene. CALR mutations are rarely detected in patients with de novo acute myeloid leukemia, chronic myelogenous leukemia, lymphoid leukemia, or solid tumors. CALR mutations are not detected in polycythemia and generally appear to be mutually exclusive with JAK2 mutations and MPL mutations. The majority of mutational changes involve a variety of insertion or deletion mutations in exon 9 of the calreticulin gene: approximately 53% of all CALR mutations are a 52 bp deletion (type-1) while the second most prevalent mutation (approximately 32%) contains a 5 bp insertion  (type-2). Other mutations (non-type 1 or type 2) are seen  in a small minority of cases. CALR mutations in PMF tend to be associated with a favorable prognosis compared to JAK2 V617F mutations, whereas primary myelofibrosis negative for CALR, JAK2 V617F and MPL mutations (so-called triple negative) is associated with a poor prognosis and shorter survival. The detection of a CALR gene mutation aids in the specific diagnosis of a myeloproliferative neoplasm, and help distinguish this clonal disease from a benign reactive process.    Methodology: Comment     Comment: (NOTE) Genomic DNA was isolated from the provided specimen. Polymerase chain reaction (PCR) of exon 9 of the CALR gene was performed with specific fluorescent-labeled primers, and the PCR product was analyzed by capillary gel electrophoresis to determine the size of the PCR products. This PCR assay is capable of detecting a mutant cell population with a sensitivity of 5 mutant cells per 100 normal cells. A negative result does not exclude the presence of a myeloproliferative disorder or other neoplastic process. This test was developed and its performance characteristics determined by [facility]. It has not been cleared or approved by the Food and Drug Administration. The FDA has determined that such clearance or approval is not necessary.    References: Comment     Comment: (NOTE) 1. Klampfel, T. et al. (2013) Somatic mutations of calreticulin in   myeloproliferative neoplasms. [place] Engl. J. Med. 270:[phone]. 2. Haynes Kerns et al. (2013) Somatic CALR mutations in   myeloproliferative neoplasms with nonmutated JAK2. [place]l. J.   Med. [phone].    Director Review Comment     Comment: (NOTE) Loni Muse, PhD, [facility]    Director, [place] for [place], [place] [address]    [phone]    MPL MUTATION ANALYSIS RESULT: Comment     Comment:  (NOTE) No MPL mutation was identified in the provided specimen of this individual. Results should be interpreted in conjunction with clinical and other laboratory findings for the most accurate interpretation.    BACKGROUND: Comment     Comment: (NOTE) MPL (myeloproliferative leukemia virus oncogene homology) belongs to the hematopoietin superfamily and enables its ligand thrombopoietin to facilitate both global hematopoiesis and megakaryocyte growth and differentiation. MPL W515 mutations are present in patients with primary myelofibrosis (PMF) and essential thrombocythemia (ET) at a frequency of approximately 5% and 1% respectively. The S505 mutation is detected in patients with hereditary thrombocythemia.    METHODOLOGY: Comment     Comment: (NOTE) Genomic DNA was purified from the provided specimen. MPL gene region covering the S505N and W515L/K mutations were subjected to PCR amplification and bi-directional sequencing in duplicate to identify sequence variations. This assay has a sensitivity to detect approximately 20-25% population of cells containing the MPL  mutations in a background of non-mutant cells. This assay will not detect the mutation below the sensitivity of this assay. Molecular- based testing is highly accurate, but as in any laboratory test, rare diagnostic errors may occur.    REFERENCES: Comment     Comment: (NOTE) 1. Pardanani AD, et al. (2006). MPL515 mutations in   myeloproliferative and other myeloid disorders: a study   of 1182 patients. Blood [phone]. 2. Andre Lefort and Levine RL. (2008). JAK2 and MPL   mutations in myeloproliferative neoplasms: discovery and   science. Leukemia 22:1813-1817. 3. Juline Patch, et al. (2009). Evidence for a founder effect   of the MPL-S505N mutation in eight [place] pedigrees with   hereditary thrombocythemia. Haematologica 94(10):1368-   2671.    DIRECTOR REVIEW: Comment     Comment: (NOTE) Kirby Crigler, PhD, [place] Associate Technical Director, [place] [facility] / Pathology   [place]) [place], [place],   [place], [place]   [phone] This test was developed and its performance characteristics determined by [facility]. It has not been cleared or approved by the Food and Drug Administration.    Extraction Comment     Comment: (NOTE) This sample has been received and DNA extraction has been performed. Performed At: [facility] [address] [place], [place] [id number] Katina Degree MDPhD [id number] Performed At: [place], [place] [id number] Katina Degree MDPhD [id number]   CBC     Status: Abnormal   Collection Time: 09/21/19  9:27 AM  Result Value Ref Range   WBC 19.3 (H) 4.0 - 10.5 K/uL   RBC 4.62 3.87 - 5.11 MIL/uL   Hemoglobin 13.7 12.0 - 15.0 g/dL   HCT 39.7 36.0 - 46.0 %   MCV 85.9 80.0 - 100.0 fL   MCH 29.7 26.0 - 34.0 pg   MCHC 34.5 30.0 - 36.0 g/dL   RDW 13.2 11.5 - 15.5 %   Platelets 632 (H) 150 - 400 K/uL   nRBC 0.0 0.0 - 0.2 %    Comment: Performed at [facility] [address] [place], [place] [address]  Comprehensive metabolic panel     Status: Abnormal   Collection Time: 09/21/19  9:27 AM  Result Value Ref Range   Sodium 137 135 - 145 mmol/L   Potassium 3.2 (L) 3.5 - 5.1 mmol/L   Chloride 95 (L) 98 - 111 mmol/L   CO2 24 22 - 32 mmol/L   Glucose, Bld 185 (H) 70 - 99 mg/dL    Comment: Glucose reference range applies only to samples taken after fasting for at least 8 hours.   BUN 40 (H) 6 - 20 mg/dL   Creatinine, Ser 1.07 (H) 0.44 - 1.00 mg/dL   Calcium 9.6 8.9 - 10.3 mg/dL   Total Protein 8.1 6.5 - 8.1 g/dL   Albumin 4.7 3.5 - 5.0 g/dL   AST 122 (H) 15 - 41 U/L   ALT 146 (H) 0 - 44 U/L   Alkaline Phosphatase 85 38 - 126 U/L   Total Bilirubin 0.6 0.3 - 1.2 mg/dL   GFR  calc non Af Amer 60 (L) >60 mL/min   GFR calc Af Amer >60 >60 mL/min   Anion gap 18 (H) 5 - 15    Comment: Performed at [facility], [address], [place], [place] [address]  Lipase, blood  Status: Abnormal   Collection Time: 09/21/19  9:27 AM  Result Value Ref Range   Lipase 67 (H) 11 - 51 U/L    Comment: Performed at [facility], [address] [place], [place] [address]  CBC with Differential     Status: Abnormal   Collection Time: 11/11/19  3:02 PM  Result Value Ref Range   WBC 13.1 (H) 4.0 - 10.5 K/uL   RBC 3.69 (L) 3.87 - 5.11 MIL/uL   Hemoglobin 10.9 (L) 12.0 - 15.0 g/dL   HCT 33.1 (L) 36.0 - 46.0 %   MCV 89.7 80.0 - 100.0 fL   MCH 29.5 26.0 - 34.0 pg   MCHC 32.9 30.0 - 36.0 g/dL   RDW 14.1 11.5 - 15.5 %   Platelets 357 150 - 400 K/uL   nRBC 0.0 0.0 - 0.2 %   Neutrophils Relative % 61 %   Neutro Abs 8.1 (H) 1.7 - 7.7 K/uL   Lymphocytes Relative 28 %   Lymphs Abs 3.7 0.7 - 4.0 K/uL   Monocytes Relative 7 %   Monocytes Absolute 0.9 0.1 - 1.0 K/uL   Eosinophils Relative 3 %   Eosinophils Absolute 0.3 0.0 - 0.5 K/uL   Basophils Relative 1 %   Basophils Absolute 0.1 0.0 - 0.1 K/uL   Immature Granulocytes 0 %   Abs Immature Granulocytes 0.05 0.00 - 0.07 K/uL    Comment: Performed at [facility], [address] [place], [place] [address]   Objective  Body mass index is 31.57 kg/m. Wt Readings from Last 3 Encounters:  11/15/19 178 lb 3.2 oz (80.8 kg)  10/25/19 187 lb (84.8 kg)  10/13/19 187 lb (84.8 kg)   Temp Readings from Last 3 Encounters:  11/15/19 97.8 F (36.6 C) (Temporal)  09/21/19 98.3 F (36.8 C) (Oral)  09/14/19 98.2 F (36.8 C)   BP Readings from Last 3 Encounters:  11/15/19 124/70  10/25/19 (!) 104/59  09/21/19 127/83   Pulse Readings from Last 3 Encounters:  11/15/19 99  10/25/19 87  09/21/19 81    Physical Exam Vitals and nursing note reviewed.  Constitutional:      Appearance: Normal appearance. She  is well-developed and well-groomed. She is obese.  HENT:     Head: Normocephalic and atraumatic.  Eyes:     Conjunctiva/sclera: Conjunctivae normal.     Pupils: Pupils are equal, round, and reactive to light.  Cardiovascular:     Rate and Rhythm: Normal rate and regular rhythm.     Heart sounds: Normal heart sounds.  Pulmonary:     Effort: Pulmonary effort is normal. No respiratory distress.     Breath sounds: Normal breath sounds.  Abdominal:     General: Abdomen is flat. Bowel sounds are normal.     Tenderness: There is no abdominal tenderness.  Skin:    General: Skin is warm and dry.  Neurological:     General: No focal deficit present.     Mental Status: She is alert and oriented to person, place, and time. Mental status is at baseline.     Gait: Gait normal.  Psychiatric:        Attention and Perception: Attention and perception normal.        Mood and Affect: Mood and affect normal.        Speech: Speech normal.        Behavior: Behavior normal. Behavior is cooperative.        Thought Content: Thought content  normal.        Cognition and Memory: Cognition and memory normal.        Judgment: Judgment normal.     Assessment  Plan  STD exposure - Plan: metroNIDAZOLE (FLAGYL) 500 MG tablet, azithromycin (ZITHROMAX) 500 MG tablet, doxycycline (VIBRA-TABS) 100 MG tablet bid x 1 week  R/o trich, GC/C, yeast vs other  Complete STD check  Urine cytology ancillary only([place]), HIV antibody (with reflex), Hepatitis C antibody, Hepatitis B surface antibody,quantitative, Hepatitis B Surface AntiGEN, HSV(herpes smplx)abs-1+2(IgG+IgM)-bld, RPR  Hypokalemia - Plan: Comprehensive metabolic panel, Magnesium  Elevated liver enzymes - Plan: Comprehensive metabolic panel, screen hep A/B/C  Prediabetes - Plan: Hemoglobin A1c  Vitamin D deficiency - Plan: Vitamin D (25 hydroxy)   Leukocytosis and anemia f/u with h/o Dr.   HTN controlled  Cont meds  HM Declines flu shot   Tdapwill do in future Consider pna 23, shingrix and in future if has not had  covid had 1/2 2nd due 11/18/19   S/p hysterectomy will ask at f/u if had abnormal pap? If left ovary still intact right ovary appears out per imaging  -[facility]  Dr. Vicente Males [facility] had 07/11/19 and 1/15/21and EGD had 07/11/19 with path no malignancy concern  H/o sigmoid resection in past for h/o diverticulitis  -referredmammogramhas not scheduled yetas of11/12/2020encouraged to schedule -given # again to call and sch as of 08/18/19 for mammogram  Pt given # to call to schedule today 11/15/19   rec smoking cessation smoking < or = 0.5 ppd also using THC since age 54 y.o   Provider: Dr. Olivia Mackie McLean-Scocuzza-Internal Medicine

## 2019-11-15 NOTE — Progress Notes (Signed)
Chief Complaint  Patient presents with  . Exposure to STD    Pt recently became sexually active for first time in 5 years. Partner went to be tested today. Shawna Anderson had broke and then pt started having itching, burning, white discharge a few days later.  Patient would like full panel STD testing and to check for yeast along with BV.   . Vaginitis    pt though she had a yeast infection and has been using the Monostat 7 day OTC. This did not help.   . NGU    Patient's partner has a history of Non-Gonococcal Urethritis (NGU). Unsure if this is the cause as this was said to be a while before him and the patient interacted.    STD check  1. New partner no sex in 67 years and partner just tx'ed for NGU infection she states condom recently broke and started with itching vaginal irritation and burning and white discharge tried monistat 7 otc last used 11/13/19 and she c/o lowre ab pain   2. Elevated lfts will repeat today  3. Leukocytosis/anemia due to f/u with Shawna Anderson 4. HTN on benicar 20-25 norvasc 10 mg improved   Review of Systems  Constitutional: Negative for weight loss.  HENT: Negative for hearing loss.   Eyes: Negative for blurred vision.  Respiratory: Negative for shortness of breath.   Cardiovascular: Negative for chest pain.  Gastrointestinal: Negative for abdominal pain.  Musculoskeletal: Negative for falls.  Skin: Negative for rash.  Neurological: Negative for headaches.  Psychiatric/Behavioral: Negative for depression.   Past Medical History:  Diagnosis Date  . Anxiety   . Asthma   . Bipolar 1 disorder ([place])   . Bipolar disorder (Shawna Anderson)   . CAD (coronary artery disease)    s/p stent BMS OM Cx  . Cervical herniated disc 04/12/2016  . COPD (chronic obstructive pulmonary disease) ([place])   . Depression   . Diabetes mellitus without complication ([place])   . Diverticulitis   . GERD (gastroesophageal reflux disease)   . Glaucoma   . History of blood transfusion   . Hyperlipidemia    . Hypertension   . OSA (obstructive sleep apnea)    not using cpap   . Plantar fasciitis    b/l feet s/p steroid shots w/o help and left surgery w/o help   . UTI (urinary tract infection)    Past Surgical History:  Procedure Laterality Date  . ABDOMINAL HYSTERECTOMY    . ABDOMINAL SURGERY  1995   Bowel resection.  Marland Kitchen CARDIAC CATHETERIZATION N/A 04/14/2016   Procedure: Left Heart Cath and Coronary Angiography;  Surgeon: Shawna Blanks, MD;  Location: [facility];  Service: Cardiovascular;  Laterality: N/A;  . CARDIAC SURGERY    . COLONOSCOPY WITH PROPOFOL N/A 07/11/2019   Procedure: COLONOSCOPY WITH PROPOFOL;  Surgeon: Shawna Bellows, MD;  Location[facility] END[facility]COPY;  Service: Gastroenterology;  Laterality: N/A;  . COLONOSCOPY WITH PROPOFOL N/A 07/29/2019   Procedure: COLONOSCOPY WITH PROPOFOL;  Surgeon: Shawna Bellows, MD;  Location: [facility] ENDOSCOPY;  Service: Gastroenterology;  Laterality: N/A;  . CORONARY ANGIOPLASTY WITH STENT PLACEMENT  2010   Drug eluting stent  . ESOPHAGOGASTRODUODENOSCOPY (EGD) WITH PROPOFOL N/A 07/11/2019   Procedure: ESOPHAGOGASTRODUODENOSCOPY (EGD) WITH PROPOFOL;  Surgeon: Shawna Bellows, MD;  Location: [facility] ENDOSCOPY;  Service: Gastroenterology;  Laterality: N/A;  . OVARIAN CYST REMOVAL     Family History  Problem Relation Age of Onset  . CAD Mother   . Depression Mother   .  Heart disease Mother   . Hyperlipidemia Mother   . Hypertension Mother   . CAD Brother   . Depression Brother   . Diabetes Brother   . Heart disease Brother   . Hyperlipidemia Brother   . Heart disease Father   . Alcohol abuse Father   . Lupus Other   . Sickle cell anemia Other    Social History   Socioeconomic History  . Marital status: Single    Spouse name: Not on file  . Number of children: Not on file  . Years of education: Not on file  . Highest education level: Not on file  Occupational History  . Not on file  Tobacco Use  . Smoking status: Current Every  Day Smoker    Packs/day: 0.50    Years: 30.00    Pack years: 15.00    Types: Cigarettes  . Smokeless tobacco: Never Used  . Tobacco comment: refused  Substance and Sexual Activity  . Alcohol use: Not Currently    Comment: once a year  . Drug use: Yes    Frequency: 7.0 times per week    Types: Marijuana  . Sexual activity: Not on file  Other Topics Concern  . Not on file  Social History Narrative   From [place] now living in [place]    1 son    No guns    Wears seat belt   Safe in relationship    Social Determinants of Health   Financial Resource Strain:   . Difficulty of Paying Living Expenses:   Food Insecurity:   . Worried About Charity fundraiser in the Last Year:   . Arboriculturist in the Last Year:   Transportation Needs:   . Film/video editor (Medical):   Marland Kitchen Lack of Transportation (Non-Medical):   Physical Activity:   . Days of Exercise per Week:   . Minutes of Exercise per Session:   Stress:   . Feeling of Stress :   Social Connections:   . Frequency of Communication with Friends and Family:   . Frequency of Social Gatherings with Friends and Family:   . Attends Religious Services:   . Active Member of Clubs or Organizations:   . Attends Archivist Meetings:   Marland Kitchen Marital Status:   Intimate Partner Violence:   . Fear of Current or Ex-Partner:   . Emotionally Abused:   Marland Kitchen Physically Abused:   . Sexually Abused:    Current Meds  Medication Sig  . albuterol (PROVENTIL) (2.5 MG/3ML) 0.083% nebulizer solution Take 3 mLs (2.5 mg total) by nebulization every 6 (six) hours as needed for wheezing or shortness of breath.  . Albuterol Sulfate (PROAIR RESPICLICK) 607 (90 Base) MCG/ACT AEPB Inhale 1 Inhaler into the lungs every 4 (four) hours as needed.  . ALPRAZolam (XANAX) 0.25 MG tablet Take 1 tablet (0.25 mg total) by mouth at bedtime as needed for anxiety or sleep.  Marland Kitchen amLODipine (NORVASC) 10 MG tablet Take 1 tablet (10 mg total) by  mouth at bedtime.  Marland Kitchen aspirin EC 81 MG tablet Take 1 tablet (81 mg total) by mouth daily.  . clopidogrel (PLAVIX) 75 MG tablet Take 1 tablet (75 mg total) by mouth daily.  . cyclobenzaprine (FLEXERIL) 5 MG tablet Take 1 tablet (5 mg total) by mouth at bedtime as needed for muscle spasms.  . diclofenac Sodium (VOLTAREN) 1 % GEL Apply 2 g topically 4 (four) times daily. Prn OA and  chest wall pain  . dicyclomine (BENTYL) 10 MG capsule Take 1 capsule (10 mg total) by mouth 4 (four) times daily -  before meals and at bedtime.  . divalproex (DEPAKOTE) 500 MG DR tablet Take 1 tablet (500 mg total) by mouth 2 (two) times daily.  Marland Kitchen ezetimibe (ZETIA) 10 MG tablet Take 1 tablet (10 mg total) by mouth daily. With lipitor 80 mg at night  . hyoscyamine (LEVSIN) 0.125 MG tablet Take 1 tablet (0.125 mg total) by mouth every 4 (four) hours. As needed  . ipratropium-albuterol (DUONEB) 0.5-2.5 (3) MG/3ML SOLN Take 3 mLs by nebulization every 6 (six) hours as needed.  . isosorbide mononitrate (IMDUR) 30 MG 24 hr tablet Take 1 tablet (30 mg total) by mouth daily.  Marland Kitchen lidocaine (LIDODERM) 5 % Place 1 patch onto the skin daily. Remove & Discard patch within 12 hours or as directed by MD  . nitroGLYCERIN (NITROSTAT) 0.4 MG SL tablet Place 1 tablet (0.4 mg total) under the tongue every 5 (five) minutes as needed for chest pain. X 2 call 911 after 3rd dose  . olmesartan-hydrochlorothiazide (BENICAR HCT) 40-25 MG tablet Take 1 tablet by mouth daily.  . ondansetron (ZOFRAN ODT) 4 MG disintegrating tablet Take 1 tablet (4 mg total) by mouth every 8 (eight) hours as needed for nausea or vomiting.  . promethazine (PHENERGAN) 25 MG suppository Place 1 suppository (25 mg total) rectally every 6 (six) hours as needed for nausea.  . rifaximin (XIFAXAN) 550 MG TABS tablet Take 550 mg by mouth.  . senna-docusate (SENOKOT-S) 8.6-50 MG tablet Take 1-2 tablets by mouth daily as needed for mild constipation.  . traZODone (DESYREL) 150 MG  tablet Take 1 tablet (150 mg total) by mouth at bedtime as needed for sleep.   Allergies  Allergen Reactions  . Ativan [Lorazepam]     Pt states it makes her tongue do weird things   . Latex Other (See Comments)    Patient stated that she was told by her doctor that she is "allergic to" this  . Tape Other (See Comments)    Patient stated that she was told by her doctor that she is "allergic to" this  . Xifaxan [Rifaximin]     Pt believes this is contributing to her abdominal pain/discomfort  . Drixoral Allergy Sinus [Dexbromphen-Pse-Apap Er] Rash   Recent Results (from the past 2160 hour(s))  Jak2 w/reflex to CALR/MPL     Status: None   Collection Time: 09/13/19 11:12 AM  Result Value Ref Range   JAK2 GenotypR Comment     Comment: (NOTE) Result: NEGATIVE for the JAK2 V617F mutation. Interpretation:  The G to T nucleotide change encoding the V617F mutation was not detected.  This result does not rule out the presence of the JAK2 mutation at a level below the sensitivity of detection of this assay, or the presence of other mutations within JAK2 not detected by this assay.  This result does not rule out a diagnosis of polycythemia vera, essential thrombocythemia or idiopathic myelofibrosis as the V617F mutation is not detected in all patients with these disorders.    BACKGROUND: Comment     Comment: (NOTE) JAK2 is a cytoplasmic tyrosine kinase with a key role in signal transduction from multiple hematopoietic growth factor receptors. A point mutation within exon 14 of the JAK2 gene ([id number]) encoding a valine to phenylalanine substitution at position 617 of the JAK2 protein (V617F) has been identified in most patients with polycythemia vera, and in about  half of those with either essential thrombocythemia or idiopathic myelofibrosis. The V617F has also been detected, although infrequently, in other myeloid disorders such as chronic myelomonocytic leukemia and chronic neutrophilic  luekemia. V617F is an acquired mutation that alters a highly conserved valine present in the negative regulatory JH2 domain of the JAK2 protein and is predicted to dysregulate kinase activity. Methodology: Total genomic DNA was extracted and subjected to TaqMan real-time PCR amplification/detection. Two amplification products per sample were monitored by real-time PCR using primers/probes s pecific to JAK2 wild type (WT) and JAK2 mutant V617F. The ABI7900 Absolute Quantitation software will compare the patient specimen valuse to the standard curves and generate percent values for wild type and mutant type. In vitro studies have indicated that this assay has an analytical sensitivity of 1%. References: Baxter EJ, Scott Phineas Real, et al. Acquired mutation of the tyrosine kinase JAK2 in human myeloproliferative disorders. Lancet. 2005 Mar 19-25; 365(9464):1054-1061. Alfonso Ramus Couedic JP. A unique clonal JAK2 mutation leading to constitutive signaling causes polycythaemia vera. Nature. 2005 Apr 28; 434(7037):1144-1148. Kralovics R, Passamonti F, Buser AS, et al. A gain-of-function mutation of JAK2 in myeloproliferative disorders. N Engl J Med. 2005 Apr 28; 352(17):1779-1790.    Director Review, JAK2 Comment     Comment: (NOTE) Shawna Muse, PhD, [facility]    Director, [place] for [place] and [place], [place] [address]    [phone] This test was developed and its performance characteristics determined by [facility]. It has not been cleared or approved by the Food and Drug Administration.    Reflex: Comment     Comment: (NOTE) Reflex to CALR Mutation Analysis and MPL Mutation Analysis is indicated. Performed At: [facility] [address] [place], [place] [id number] Shawna Anderson MDPhD [id number] Performed At: [place], [place] [id number] Shawna Anderson MDPhD [id number]    Iron and TIBC     Status: None   Collection Time: 09/13/19 11:12 AM  Result Value Ref Range   Iron 63 28 - 170 ug/dL   TIBC 382 250 - 450 ug/dL   Saturation Ratios 17 10.4 - 31.8 %   UIBC 319 ug/dL    Comment: Performed at [facility], [address] [place], [place] [address]  Ferritin     Status: None   Collection Time: 09/13/19 11:12 AM  Result Value Ref Range   Ferritin 70 11 - 307 ng/mL    Comment: Performed at [facility], [address] [place], [place] [address]  BCR-ABL1 FISH     Status: None   Collection Time: 09/13/19 11:12 AM  Result Value Ref Range   Specimen Type BLOOD    Cells Counted 200    Cells Analyzed 200    FISH Result Comment:     Comment: NORMAL:  NO BCR OR ABL1 GENE REARRANGEMENT OBSERVED   Interpretation Comment:     Comment: (NOTE)             nuc ish 9q34(ASS1,ABL1)x2,22q11.2(BCRx2)[200].      The fluorescence in situ hybridization (FISH) study was normal. FISH, using unique sequence DNA probes for the ABL1 and BCR gene regions showed two ABL1 signals (red), two control ASS1 gene signals (aqua) located adjacent to the ABL1 locus at 9q34, and two BCR signals (green) at 22q11.2 in all interphase nuclei examined. There was NO evidence of CML or ALL-associated BCR/ABL1 dual fusion  signals in this analysis. .      This analysis is limited to abnormalities detectable by the specific probes included in the study. FISH results should be interpreted within the context of a full cytogenetic analysis and pathology evaluation. .      This test was developed and its performance characteristics determined by [facility]). It has not been cleared or approved by the U.S. Food and Drug Administration. A BCR-ABL1 gene fusion in greater than 3 interphase nuclei in a  patient with a new clinical diagnosis is considered positive. The DNA probe vendor for this study was [employer], research (physical sciences)).     Director Review: Comment:     Comment: (NOTE) Shawna Batch, PhD, [facility] Performed At: [facility] [place], [place] [id number] Shawna Anderson MDPhD VW:[id number] Performed At: [facility] [place], [place] [id number] Shawna Farmer MD ML:[id number]   Flow cytometry panel-leukemia/lymphoma work-up     Status: None   Collection Time: 09/13/19 11:12 AM  Result Value Ref Range   PATH INTERP XXX-IMP Comment     Comment: (NOTE) No significant immunophenotypic abnormality detected Lymphocytosis    ANNOTATION COMMENT IMP Comment     Comment: Correlation with pending molecular testing results is recommended.   CLINICAL INFO Comment     Comment: (NOTE) Accompanying CBC dated 09/13/2019 shows: WBC count 11.8, Neu 6.6, Lym 4.1, Mon 0.8.    Specimen Type Comment     Comment: Peripheral blood   ASSESSMENT OF LEUKOCYTES Comment     Comment: (NOTE) No monoclonal B cell population is detected. kappa:lambda ratio 1.1 An absolute increase in T-cells is detected. There is no loss of, or aberrant expression of, the pan T cell antigens to suggest a neoplastic T cell process. CD4:CD8 ratio 4.7 No circulating blasts are detected. Rare granulocytes show left-shifted maturation.There is no immunophenotypic evidence of abnormal myeloid maturation. Analysis of the lymphocyte population shows: B cells 16%, T cells 82%, NK cells 2%.    % Viable Cells Comment     Comment: (NOTE) 76% Cell viability in this sample is sufficient for analysis but is less than optimal.    ANALYSIS AND GATING STRATEGY Comment     Comment: 8 color analysis with CD45/SSC gating   IMMUNOPHENOTYPING STUDY Comment     Comment: (NOTE) CD2       Normal         CD3       Normal CD4       Normal         CD5       Normal CD7       Normal         CD8       Normal CD10      Normal         CD11b     Normal CD13      Normal         CD14      Normal CD16      Normal         CD19       Normal CD20      Normal         CD33      Normal CD34      Normal         CD38      Normal CD45      Normal  CD56      Normal CD57      Normal         CD117     Normal HLA-DR    Normal         KAPPA     Normal LAMBDA    Normal         CD64      Normal    PATHOLOGIST NAME Comment     Comment: Shawna Anderson, M.D.   COMMENT: Comment     Comment: (NOTE) Each antibody in this assay was utilized to assess for potential abnormalities of studied cell populations or to characterize identified abnormalities. This test was developed and its performance characteristics determined by [facility].  It has not been cleared or approved by the U.S. Food and Drug Administration. The FDA has determined that such clearance or approval is not necessary. This test is used for clinical purposes.  It should not be regarded as investigational or for research. Performed At: -[facility] [address] [place], [place] [id number] Shawna Anderson MDPhD [id number] Performed At: [facility] [address] [place], [place] [id number] Shawna Anderson MDPhD [id number]   CBC with Differential/Platelet     Status: Abnormal   Collection Time: 09/13/19 11:12 AM  Result Value Ref Range   WBC 11.8 (H) 4.0 - 10.5 K/uL   RBC 4.01 3.87 - 5.11 MIL/uL   Hemoglobin 11.7 (L) 12.0 - 15.0 g/dL   HCT 36.0 36.0 - 46.0 %   MCV 89.8 80.0 - 100.0 fL   MCH 29.2 26.0 - 34.0 pg   MCHC 32.5 30.0 - 36.0 g/dL   RDW 13.2 11.5 - 15.5 %   Platelets 370 150 - 400 K/uL   nRBC 0.0 0.0 - 0.2 %   Neutrophils Relative % 56 %   Neutro Abs 6.6 1.7 - 7.7 K/uL   Lymphocytes Relative 34 %   Lymphs Abs 4.1 (H) 0.7 - 4.0 K/uL   Monocytes Relative 7 %   Monocytes Absolute 0.8 0.1 - 1.0 K/uL   Eosinophils Relative 2 %   Eosinophils Absolute 0.2 0.0 - 0.5 K/uL   Basophils Relative 1 %   Basophils Absolute 0.1 0.0 - 0.1 K/uL   Immature Granulocytes 0 %   Abs Immature Granulocytes 0.05 0.00 - 0.07 K/uL    Comment: Performed at [facility], [address], [place], [place] [address]  Technologist smear review     Status: None   Collection Time: 09/13/19 11:12 AM  Result Value Ref Range   WBC Morphology MORPHOLOGY UNREMARKABLE    RBC Morphology RBC MORPHOLOGY NORMAL    Tech Review Normal platelet morphology     Comment: PLATELETS APPEAR ADEQUATE Performed at [facility], [address] [place], [place] [address]   CALR + MPL (reflexed)     Status: None   Collection Time: 09/13/19 11:12 AM  Result Value Ref Range   CALR Mutation Detection Result Comment     Comment: (NOTE) NEGATIVE No insertions or deletions were detected within the analyzed region of the calreticulin (CALR) gene. A negative result does not entirely exclude the possibility of a clonal population carrying CALR gene mutations that are not covered by this assay. Results should be interpreted in conjunction with clinical and laboratory findings for the most accurate interpretation.    Background: Comment     Comment: (NOTE) The calcium-binding endoplasmic reticulin chaperone protein, calreticulin (CALR), is somatically mutated in approximately 70% of  patients with JAK2-negative essential thrombocythemia (ET) and 60- 88% of patients with JAK2-negative primary myelofibrosis(PMF). Only a minority of patients (approximately 8%) with myelodysplasia have mutations in  CALR gene. CALR mutations are rarely detected in patients with de novo acute myeloid leukemia, chronic myelogenous leukemia, lymphoid leukemia, or solid tumors. CALR mutations are not detected in polycythemia and generally appear to be mutually exclusive with JAK2 mutations and MPL mutations. The majority of mutational changes involve a variety of insertion or deletion mutations in exon 9 of the calreticulin gene: approximately 53% of all CALR mutations are a 52 bp deletion (type-1) while the second most prevalent mutation (approximately 32%) contains a 5 bp insertion  (type-2). Other mutations (non-type 1 or type 2) are seen  in a small minority of cases. CALR mutations in PMF tend to be associated with a favorable prognosis compared to JAK2 V617F mutations, whereas primary myelofibrosis negative for CALR, JAK2 V617F and MPL mutations (so-called triple negative) is associated with a poor prognosis and shorter survival. The detection of a CALR gene mutation aids in the specific diagnosis of a myeloproliferative neoplasm, and help distinguish this clonal disease from a benign reactive process.    Methodology: Comment     Comment: (NOTE) Genomic DNA was isolated from the provided specimen. Polymerase chain reaction (PCR) of exon 9 of the CALR gene was performed with specific fluorescent-labeled primers, and the PCR product was analyzed by capillary gel electrophoresis to determine the size of the PCR products. This PCR assay is capable of detecting a mutant cell population with a sensitivity of 5 mutant cells per 100 normal cells. A negative result does not exclude the presence of a myeloproliferative disorder or other neoplastic process. This test was developed and its performance characteristics determined by [facility]. It has not been cleared or approved by the Food and Drug Administration. The FDA has determined that such clearance or approval is not necessary.    References: Comment     Comment: (NOTE) 1. Klampfel, T. et al. (2013) Somatic mutations of calreticulin in   myeloproliferative neoplasms. New Engl. J. Med. 131:4388-8757. 2. Haynes Kerns et al. (2013) Somatic CALR mutations in   myeloproliferative neoplasms with nonmutated JAK2. New Engl. J.   Med. [id number].    Director Review Comment     Comment: (NOTE) Shawna Muse, PhD, [facility]    Director, [place] for [place] and [place], [place] [address]    [phone]    MPL MUTATION ANALYSIS RESULT: Comment     Comment:  (NOTE) No MPL mutation was identified in the provided specimen of this individual. Results should be interpreted in conjunction with clinical and other laboratory findings for the most accurate interpretation.    BACKGROUND: Comment     Comment: (NOTE) MPL (myeloproliferative leukemia virus oncogene homology) belongs to the hematopoietin superfamily and enables its ligand thrombopoietin to facilitate both global hematopoiesis and megakaryocyte growth and differentiation. MPL W515 mutations are present in patients with primary myelofibrosis (PMF) and essential thrombocythemia (ET) at a frequency of approximately 5% and 1% respectively. The S505 mutation is detected in patients with hereditary thrombocythemia.    METHODOLOGY: Comment     Comment: (NOTE) Genomic DNA was purified from the provided specimen. MPL gene region covering the S505N and W515L/K mutations were subjected to PCR amplification and bi-directional sequencing in duplicate to identify sequence variations. This assay has a sensitivity to detect approximately 20-25% population of cells containing the MPL  mutations in a background of non-mutant cells. This assay will not detect the mutation below the sensitivity of this assay. Molecular- based testing is highly accurate, but as in any laboratory test, rare diagnostic errors may occur.    REFERENCES: Comment     Comment: (NOTE) 1. Pardanani AD, et al. (2006). MPL515 mutations in   myeloproliferative and other myeloid disorders: a study   of 1182 patients. Blood 924:2683-4196. 2. Andre Lefort and Levine RL. (2008). JAK2 and MPL   mutations in myeloproliferative neoplasms: discovery and   science. Leukemia 22:1813-1817. 3. Juline Patch, et al. (2009). Evidence for a founder effect   of the MPL-S505N mutation in eight [place] pedigrees with   hereditary thrombocythemia. Haematologica 94(10):1368-   2229.    DIRECTOR REVIEW: Comment     Comment: (NOTE) Shawna Crigler, PhD, [place] Associate Technical Director, [place] for Molecular Biology / Pathology   [place]) [place], [place],   [name], [place]   [phone] This test was developed and its performance characteristics determined by [facility]. It has not been cleared or approved by the Food and Drug Administration.    Extraction Comment     Comment: (NOTE) This sample has been received and DNA extraction has been performed. Performed At: [facility] [address] [place], [place] [id number] Shawna Anderson MDPhD [id number] Performed At: [place], [place] [id number] Shawna Anderson MDPhD [id number]   CBC     Status: Abnormal   Collection Time: 09/21/19  9:27 AM  Result Value Ref Range   WBC 19.3 (H) 4.0 - 10.5 K/uL   RBC 4.62 3.87 - 5.11 MIL/uL   Hemoglobin 13.7 12.0 - 15.0 g/dL   HCT 39.7 36.0 - 46.0 %   MCV 85.9 80.0 - 100.0 fL   MCH 29.7 26.0 - 34.0 pg   MCHC 34.5 30.0 - 36.0 g/dL   RDW 13.2 11.5 - 15.5 %   Platelets 632 (H) 150 - 400 K/uL   nRBC 0.0 0.0 - 0.2 %    Comment: Performed at [facility], [address] [place], [place] [address]  Comprehensive metabolic panel     Status: Abnormal   Collection Time: 09/21/19  9:27 AM  Result Value Ref Range   Sodium 137 135 - 145 mmol/L   Potassium 3.2 (L) 3.5 - 5.1 mmol/L   Chloride 95 (L) 98 - 111 mmol/L   CO2 24 22 - 32 mmol/L   Glucose, Bld 185 (H) 70 - 99 mg/dL    Comment: Glucose reference range applies only to samples taken after fasting for at least 8 hours.   BUN 40 (H) 6 - 20 mg/dL   Creatinine, Ser 1.07 (H) 0.44 - 1.00 mg/dL   Calcium 9.6 8.9 - 10.3 mg/dL   Total Protein 8.1 6.5 - 8.1 g/dL   Albumin 4.7 3.5 - 5.0 g/dL   AST 122 (H) 15 - 41 U/L   ALT 146 (H) 0 - 44 U/L   Alkaline Phosphatase 85 38 - 126 U/L   Total Bilirubin 0.6 0.3 - 1.2 mg/dL   GFR  calc non Af Amer 60 (L) >60 mL/min   GFR calc Af Amer >60 >60 mL/min   Anion gap 18 (H) 5 - 15    Comment: Performed at [facility], [address] [place], [place] [address]  Lipase, blood  Status: Abnormal   Collection Time: 09/21/19  9:27 AM  Result Value Ref Range   Lipase 67 (H) 11 - 51 U/L    Comment: Performed at [facility], [address] [place], [place] [address]  CBC with Differential     Status: Abnormal   Collection Time: 11/11/19  3:02 PM  Result Value Ref Range   WBC 13.1 (H) 4.0 - 10.5 K/uL   RBC 3.69 (L) 3.87 - 5.11 MIL/uL   Hemoglobin 10.9 (L) 12.0 - 15.0 g/dL   HCT 33.1 (L) 36.0 - 46.0 %   MCV 89.7 80.0 - 100.0 fL   MCH 29.5 26.0 - 34.0 pg   MCHC 32.9 30.0 - 36.0 g/dL   RDW 14.1 11.5 - 15.5 %   Platelets 357 150 - 400 K/uL   nRBC 0.0 0.0 - 0.2 %   Neutrophils Relative % 61 %   Neutro Abs 8.1 (H) 1.7 - 7.7 K/uL   Lymphocytes Relative 28 %   Lymphs Abs 3.7 0.7 - 4.0 K/uL   Monocytes Relative 7 %   Monocytes Absolute 0.9 0.1 - 1.0 K/uL   Eosinophils Relative 3 %   Eosinophils Absolute 0.3 0.0 - 0.5 K/uL   Basophils Relative 1 %   Basophils Absolute 0.1 0.0 - 0.1 K/uL   Immature Granulocytes 0 %   Abs Immature Granulocytes 0.05 0.00 - 0.07 K/uL    Comment: Performed at [facility], [address] [place], [place] [address]   Objective  Body mass index is 31.57 kg/m. Wt Readings from Last 3 Encounters:  11/15/19 178 lb 3.2 oz (80.8 kg)  10/25/19 187 lb (84.8 kg)  10/13/19 187 lb (84.8 kg)   Temp Readings from Last 3 Encounters:  11/15/19 97.8 F (36.6 C) (Temporal)  09/21/19 98.3 F (36.8 C) (Oral)  09/14/19 98.2 F (36.8 C)   BP Readings from Last 3 Encounters:  11/15/19 124/70  10/25/19 (!) 104/59  09/21/19 127/83   Pulse Readings from Last 3 Encounters:  11/15/19 99  10/25/19 87  09/21/19 81    Physical Exam Vitals and nursing note reviewed.  Constitutional:      Appearance: Normal appearance. She  is well-developed and well-groomed. She is obese.  HENT:     Head: Normocephalic and atraumatic.  Eyes:     Conjunctiva/sclera: Conjunctivae normal.     Pupils: Pupils are equal, round, and reactive to light.  Cardiovascular:     Rate and Rhythm: Normal rate and regular rhythm.     Heart sounds: Normal heart sounds.  Pulmonary:     Effort: Pulmonary effort is normal. No respiratory distress.     Breath sounds: Normal breath sounds.  Abdominal:     General: Abdomen is flat. Bowel sounds are normal.     Tenderness: There is no abdominal tenderness.  Skin:    General: Skin is warm and dry.  Neurological:     General: No focal deficit present.     Mental Status: She is alert and oriented to person, place, and time. Mental status is at baseline.     Gait: Gait normal.  Psychiatric:        Attention and Perception: Attention and perception normal.        Mood and Affect: Mood and affect normal.        Speech: Speech normal.        Behavior: Behavior normal. Behavior is cooperative.        Thought Content: Thought content  normal.        Cognition and Memory: Cognition and memory normal.        Judgment: Judgment normal.     Assessment  Plan  STD exposure - Plan: metroNIDAZOLE (FLAGYL) 500 MG tablet, azithromycin (ZITHROMAX) 500 MG tablet, doxycycline (VIBRA-TABS) 100 MG tablet bid x 1 week  R/o trich, GC/C, yeast vs other  Complete STD check  Urine cytology ancillary only([place]), HIV antibody (with reflex), Hepatitis C antibody, Hepatitis B surface antibody,quantitative, Hepatitis B Surface AntiGEN, HSV(herpes smplx)abs-1+2(IgG+IgM)-bld, RPR  Hypokalemia - Plan: Comprehensive metabolic panel, Magnesium  Elevated liver enzymes - Plan: Comprehensive metabolic panel, screen hep A/B/C  Prediabetes - Plan: Hemoglobin A1c  Vitamin D deficiency - Plan: Vitamin D (25 hydroxy)   Leukocytosis and anemia f/u with h/o Dr.   HTN controlled  Cont meds  HM Declines flu shot   Tdapwill do in future Consider pna 23, shingrix and in future if has not had  covid had 1/2 2nd due 11/18/19   S/p hysterectomy will ask at f/u if had abnormal pap? If left ovary still intact right ovary appears out per imaging  -established with [facility]  Shawna Anderson had 07/11/19 and 1/15/21and EGD had 07/11/19 with path no malignancy concern  H/o sigmoid resection in past for h/o diverticulitis  -referredmammogramhas not scheduled yetas of11/12/2020encouraged to schedule -given # again to call and sch as of 08/18/19 for mammogram  Pt given # to call to schedule today 11/15/19   rec smoking cessation smoking < or = 0.5 ppd also using THC since age 55 y.o   Provider: Dr. Olivia Mackie McLean-Scocuzza-Internal Medicine    Pt would like full panel STI/STD testing including HSV, Yeast, BV, and Trich.  Dirty urine collected and awaiting orders.

## 2019-11-16 LAB — MAGNESIUM: Magnesium: 2.3 mg/dL (ref 1.5–2.5)

## 2019-11-16 LAB — COMPREHENSIVE METABOLIC PANEL
ALT: 41 U/L — ABNORMAL HIGH (ref 0–35)
AST: 27 U/L (ref 0–37)
Albumin: 4.4 g/dL (ref 3.5–5.2)
Alkaline Phosphatase: 89 U/L (ref 39–117)
BUN: 19 mg/dL (ref 6–23)
CO2: 29 mEq/L (ref 19–32)
Calcium: 9.9 mg/dL (ref 8.4–10.5)
Chloride: 102 mEq/L (ref 96–112)
Creatinine, Ser: 0.92 mg/dL (ref 0.40–1.20)
GFR: 63.97 mL/min (ref >60.00)
Glucose, Bld: 120 mg/dL — ABNORMAL HIGH (ref 70–99)
Potassium: 3.6 mEq/L (ref 3.5–5.1)
Sodium: 141 mEq/L (ref 135–145)
Total Bilirubin: 0.4 mg/dL (ref 0.2–1.2)
Total Protein: 6.8 g/dL (ref 6.0–8.3)

## 2019-11-16 LAB — HEPATITIS C ANTIBODY
Hepatitis C Ab: NONREACTIVE
SIGNAL TO CUT-OFF: 0.01 (ref <1.00)

## 2019-11-16 LAB — HEMOGLOBIN A1C: Hgb A1c MFr Bld: 6.2 % (ref 4.6–6.5)

## 2019-11-16 LAB — HEPATITIS B SURFACE ANTIBODY, QUANTITATIVE: Hep B S AB Quant (Post): 27 m[IU]/mL (ref >=10)

## 2019-11-16 LAB — HEPATITIS B SURFACE ANTIGEN: Hepatitis B Surface Ag: NONREACTIVE

## 2019-11-16 LAB — HEPATITIS A ANTIBODY, TOTAL: Hepatitis A AB,Total: NONREACTIVE

## 2019-11-16 LAB — HIV ANTIBODY (ROUTINE TESTING W REFLEX): HIV 1&2 Ab, 4th Generation: NONREACTIVE

## 2019-11-16 LAB — HEPATITIS A ANTIBODY, IGM: Hep A IgM: NONREACTIVE

## 2019-11-16 LAB — RPR: RPR Ser Ql: NONREACTIVE

## 2019-11-16 LAB — VITAMIN D 25 HYDROXY (VIT D DEFICIENCY, FRACTURES): VITD: 18.63 ng/mL — ABNORMAL LOW (ref 30.00–100.00)

## 2019-11-16 MED ORDER — CHOLECALCIFEROL 1.25 MG (50000 UT) PO CAPS: 50000.0000 [IU] | ORAL_CAPSULE | ORAL | 1 refills | Status: DC

## 2019-11-16 NOTE — Addendum Note (Signed)
Addended by: Orland Mustard on: 11/16/2019 01:02 PM   Modules accepted: Orders

## 2019-11-17 LAB — HSV(HERPES SMPLX)ABS-I+II(IGG+IGM)-BLD
HSV 1 Glycoprotein G Ab, IgG: 60.7 index — ABNORMAL HIGH (ref 0.00–0.90)
HSV 2 IgG, Type Spec: <0.91 index (ref 0.00–0.90)
HSVI/II Comb IgM: <0.91 Ratio (ref 0.00–0.90)

## 2019-11-23 LAB — URINE CYTOLOGY ANCILLARY ONLY
Bacterial Vaginitis-Urine: POSITIVE — AB
Bacterial Vaginitis-Urine: POSITIVE — AB
Candida Urine: NEGATIVE
Chlamydia: NEGATIVE
Comment: NEGATIVE
Comment: NEGATIVE
Comment: NORMAL
Neisseria Gonorrhea: NEGATIVE
Trichomonas: POSITIVE — AB

## 2019-11-27 ENCOUNTER — Other Ambulatory Visit: Payer: Self-pay | Admitting: Gastroenterology

## 2019-11-27 DIAGNOSIS — K219 Gastro-esophageal reflux disease without esophagitis: Secondary | ICD-10-CM

## 2019-11-29 ENCOUNTER — Ambulatory Visit: Payer: Medicare Other | Admitting: Family

## 2019-12-01 ENCOUNTER — Other Ambulatory Visit: Payer: Self-pay | Admitting: Gastroenterology

## 2019-12-01 DIAGNOSIS — K219 Gastro-esophageal reflux disease without esophagitis: Secondary | ICD-10-CM

## 2019-12-02 ENCOUNTER — Other Ambulatory Visit: Payer: Self-pay | Admitting: Internal Medicine

## 2019-12-02 DIAGNOSIS — I25118 Atherosclerotic heart disease of native coronary artery with other forms of angina pectoris: Secondary | ICD-10-CM

## 2019-12-02 DIAGNOSIS — R110 Nausea: Secondary | ICD-10-CM

## 2019-12-02 MED ORDER — NITROGLYCERIN 0.4 MG SL SUBL: 0.4000 mg | SUBLINGUAL_TABLET | SUBLINGUAL | 3 refills | Status: DC | PRN

## 2019-12-02 MED ORDER — ONDANSETRON 4 MG PO TBDP: 4.0000 mg | ORAL_TABLET | Freq: Three times a day (TID) | ORAL | 3 refills | Status: DC | PRN

## 2019-12-05 ENCOUNTER — Telehealth (INDEPENDENT_AMBULATORY_CARE_PROVIDER_SITE_OTHER): Payer: Medicare Other | Admitting: Family Medicine

## 2019-12-05 ENCOUNTER — Ambulatory Visit: Payer: Medicare Other | Admitting: Family

## 2019-12-05 ENCOUNTER — Other Ambulatory Visit: Payer: Self-pay

## 2019-12-05 ENCOUNTER — Encounter: Payer: Self-pay | Admitting: Family Medicine

## 2019-12-05 DIAGNOSIS — A599 Trichomoniasis, unspecified: Secondary | ICD-10-CM

## 2019-12-05 DIAGNOSIS — Z202 Contact with and (suspected) exposure to infections with a predominantly sexual mode of transmission: Secondary | ICD-10-CM | POA: Diagnosis not present

## 2019-12-05 DIAGNOSIS — J449 Chronic obstructive pulmonary disease, unspecified: Secondary | ICD-10-CM | POA: Diagnosis not present

## 2019-12-05 DIAGNOSIS — T148XXA Other injury of unspecified body region, initial encounter: Secondary | ICD-10-CM

## 2019-12-05 MED ORDER — METRONIDAZOLE 500 MG PO TABS: 500.0000 mg | ORAL_TABLET | Freq: Two times a day (BID) | ORAL | 0 refills | Status: DC

## 2019-12-05 MED ORDER — PREDNISONE 20 MG PO TABS
40.0000 mg | ORAL_TABLET | Freq: Every day | ORAL | 0 refills | Status: DC
Start: 2019-12-05 — End: 2020-01-11

## 2019-12-05 MED ORDER — DOXYCYCLINE HYCLATE 100 MG PO TABS: 100.0000 mg | ORAL_TABLET | Freq: Two times a day (BID) | ORAL | 0 refills | Status: DC

## 2019-12-05 NOTE — Assessment & Plan Note (Signed)
Patient with possible COPD exacerbation.  Also discussed that I could not rule out Covid without a test.  I encouraged her to get tested for COVID-19 and quarantine at least until she has the test result back.  We will go ahead and treat with doxycycline and prednisone for possible COPD exacerbation.  If not improving she will let us know.

## 2019-12-05 NOTE — Assessment & Plan Note (Signed)
Possibly related to her being on Plavix.  She has had an extensive work-up recently for CBC abnormalities and I would not expect those to be contributing given her negative work-up.  I will forward my note to her hematologist to make sure that they agree with that.

## 2019-12-05 NOTE — Assessment & Plan Note (Signed)
Persistent symptoms possibly related to reinfection versus inadequate treatment.  We will treat again with another course of Flagyl.  I encouraged her to see if her partner could be treated with a 7-day regimen.  We will have her follow-up with her PCP in about a month to consider retesting.

## 2019-12-05 NOTE — Progress Notes (Signed)
Virtual Visit via telephone Note  This visit type was conducted due to national recommendations for restrictions regarding the COVID-19 pandemic (e.g. social distancing).  This format is felt to be most appropriate for this patient at this time.  All issues noted in this document were discussed and addressed.  No physical exam was performed (except for noted visual exam findings with Video Visits).   I connected with [facility] today at  2:45 PM EDT by telephone and verified that I am speaking with the correct person using two identifiers. Location patient: park Location provider: work Persons participating in the virtual visit: patient, provider  I discussed the limitations, risks, security and privacy concerns of performing an evaluation and management service by telephone and the availability of in person appointments. I also discussed with the patient that there may be a patient responsible charge related to this service. The patient expressed understanding and agreed to proceed.  Interactive audio and video telecommunications were attempted between this provider and patient, however failed, due to patient having technical difficulties OR patient did not have access to video capability.  We continued and completed visit with audio only.   Reason for visit: same day visit  HPI: Cough: Patient notes she typically gets this with weather changes.  She does have COPD and asthma.  She notes lots of wheezing.  She is coughing up phlegm.  Mild shortness of breath here and there.  No fevers.  She received her second Covid vaccine about a week ago.  No COVID-19 exposure.  She has been using albuterol with little benefit.  Bruises: Patient notes she will wake up with these in random places.  They are not just on her extremities.  She has recently seen hematology for work-up of CBC abnormalities.  It appears the work-up was unremarkable.  Platelets appear to have been acceptable on most recent  check.  She is on Plavix.  Trichomonas/bacterial vaginosis: Patient notes being treated for this earlier this month.  She continues to have vaginal itching.  She notes her sexual partner was treated with a single dose of Flagyl and she wonders if you should be treated with a longer course.   ROS: See pertinent positives and negatives per HPI.  Past Medical History:  Diagnosis Date  . Anxiety   . Asthma   . Bipolar 1 disorder ([place])   . Bipolar disorder ([place])   . CAD (coronary artery disease)    s/p stent BMS OM Cx  . Cervical herniated disc 04/12/2016  . COPD (chronic obstructive pulmonary disease) ([place])   . Depression   . Diabetes mellitus without complication ([place])   . Diverticulitis   . GERD (gastroesophageal reflux disease)   . Glaucoma   . History of blood transfusion   . Hyperlipidemia   . Hypertension   . OSA (obstructive sleep apnea)    not using cpap   . Plantar fasciitis    b/l feet s/p steroid shots w/o help and left surgery w/o help   . UTI (urinary tract infection)     Past Surgical History:  Procedure Laterality Date  . ABDOMINAL HYSTERECTOMY    . ABDOMINAL SURGERY  1995   Bowel resection.  Marland Kitchen CARDIAC CATHETERIZATION N/A 04/14/2016   Procedure: Left Heart Cath and Coronary Angiography;  Surgeon: Burnell Blanks, MD;  Location: [place] CV [facility];  Service: Cardiovascular;  Laterality: N/A;  . CARDIAC SURGERY    . COLONOSCOPY WITH PROPOFOL N/A 07/11/2019   Procedure: COLONOSCOPY WITH  PROPOFOL;  Surgeon: Jonathon Bellows, MD;  Location: [facility] ENDOSCOPY;  Service: Gastroenterology;  Laterality: N/A;  . COLONOSCOPY WITH PROPOFOL N/A 07/29/2019   Procedure: COLONOSCOPY WITH PROPOFOL;  Surgeon: Jonathon Bellows, MD;  Location: [facility] ENDOSCOPY;  Service: Gastroenterology;  Laterality: N/A;  . CORONARY ANGIOPLASTY WITH STENT PLACEMENT  2010   Drug eluting stent  . ESOPHAGOGASTRODUODENOSCOPY (EGD) WITH PROPOFOL N/A 07/11/2019   Procedure: ESOPHAGOGASTRODUODENOSCOPY (EGD)  WITH PROPOFOL;  Surgeon: Jonathon Bellows, MD;  Location: [facility] ENDOSCOPY;  Service: Gastroenterology;  Laterality: N/A;  . OVARIAN CYST REMOVAL      Family History  Problem Relation Age of Onset  . CAD Mother   . Depression Mother   . Heart disease Mother   . Hyperlipidemia Mother   . Hypertension Mother   . CAD Brother   . Depression Brother   . Diabetes Brother   . Heart disease Brother   . Hyperlipidemia Brother   . Heart disease Father   . Alcohol abuse Father   . Lupus Other   . Sickle cell anemia Other     SOCIAL HX: Smoker   Current Outpatient Medications:  .  albuterol (PROVENTIL) (2.5 MG/3ML) 0.083% nebulizer solution, Take 3 mLs (2.5 mg total) by nebulization every 6 (six) hours as needed for wheezing or shortness of breath., Disp: 360 mL, Rfl: 12 .  Albuterol Sulfate (PROAIR RESPICLICK) 123XX123 (90 Base) MCG/ACT AEPB, Inhale 1 Inhaler into the lungs every 4 (four) hours as needed., Disp: 3 each, Rfl: 4 .  ALPRAZolam (XANAX) 0.25 MG tablet, Take 1 tablet (0.25 mg total) by mouth at bedtime as needed for anxiety or sleep., Disp: 30 tablet, Rfl: 2 .  amLODipine (NORVASC) 10 MG tablet, Take 1 tablet (10 mg total) by mouth at bedtime., Disp: 90 tablet, Rfl: 3 .  aspirin EC 81 MG tablet, Take 1 tablet (81 mg total) by mouth daily., Disp: 90 tablet, Rfl: 3 .  azithromycin (ZITHROMAX) 500 MG tablet, 2 pills today with food x 1 start doxycycline 11/16/19, Disp: 2 tablet, Rfl: 0 .  Cholecalciferol 1.25 MG (50000 UT) capsule, Take 1 capsule (50,000 Units total) by mouth once a week., Disp: 13 capsule, Rfl: 1 .  clopidogrel (PLAVIX) 75 MG tablet, Take 1 tablet (75 mg total) by mouth daily., Disp: 90 tablet, Rfl: 3 .  cyclobenzaprine (FLEXERIL) 5 MG tablet, Take 1 tablet (5 mg total) by mouth at bedtime as needed for muscle spasms., Disp: 30 tablet, Rfl: 0 .  diclofenac Sodium (VOLTAREN) 1 % GEL, Apply 2 g topically 4 (four) times daily. Prn OA and chest wall pain, Disp: 450 g, Rfl: 3 .   dicyclomine (BENTYL) 10 MG capsule, Take 1 capsule (10 mg total) by mouth 4 (four) times daily -  before meals and at bedtime., Disp: 360 capsule, Rfl: 0 .  divalproex (DEPAKOTE) 500 MG DR tablet, Take 1 tablet (500 mg total) by mouth 2 (two) times daily., Disp: 180 tablet, Rfl: 1 .  doxycycline (VIBRA-TABS) 100 MG tablet, Take 1 tablet (100 mg total) by mouth 2 (two) times daily. With food, Disp: 14 tablet, Rfl: 0 .  ezetimibe (ZETIA) 10 MG tablet, Take 1 tablet (10 mg total) by mouth daily. With lipitor 80 mg at night, Disp: 90 tablet, Rfl: 3 .  hyoscyamine (LEVSIN) 0.125 MG tablet, Take 1 tablet (0.125 mg total) by mouth every 4 (four) hours. As needed, Disp: 180 tablet, Rfl: 5 .  ipratropium-albuterol (DUONEB) 0.5-2.5 (3) MG/3ML SOLN, Take 3 mLs by nebulization every 6 (  six) hours as needed., Disp: 360 mL, Rfl: 11 .  isosorbide mononitrate (IMDUR) 30 MG 24 hr tablet, Take 1 tablet (30 mg total) by mouth daily., Disp: 90 tablet, Rfl: 0 .  lidocaine (LIDODERM) 5 %, Place 1 patch onto the skin daily. Remove & Discard patch within 12 hours or as directed by MD, Disp: 30 patch, Rfl: 0 .  metroNIDAZOLE (FLAGYL) 500 MG tablet, Take 1 tablet (500 mg total) by mouth 2 (two) times daily. With food, Disp: 14 tablet, Rfl: 0 .  nitroGLYCERIN (NITROSTAT) 0.4 MG SL tablet, Place 1 tablet (0.4 mg total) under the tongue every 5 (five) minutes as needed for chest pain. X 2 call 911 after 3rd dose, Disp: 90 tablet, Rfl: 3 .  olmesartan-hydrochlorothiazide (BENICAR HCT) 40-25 MG tablet, Take 1 tablet by mouth daily., Disp: 90 tablet, Rfl: 3 .  ondansetron (ZOFRAN ODT) 4 MG disintegrating tablet, Take 1 tablet (4 mg total) by mouth every 8 (eight) hours as needed for nausea or vomiting., Disp: 270 tablet, Rfl: 3 .  pantoprazole (PROTONIX) 40 MG tablet, TAKE 1 TABLET BY MOUTH  TWICE DAILY BEFORE MEALS, Disp: 180 tablet, Rfl: 0 .  promethazine (PHENERGAN) 25 MG suppository, Place 1 suppository (25 mg total) rectally  every 6 (six) hours as needed for nausea., Disp: 12 suppository, Rfl: 1 .  rifaximin (XIFAXAN) 550 MG TABS tablet, Take 550 mg by mouth., Disp: , Rfl:  .  senna-docusate (SENOKOT-S) 8.6-50 MG tablet, Take 1-2 tablets by mouth daily as needed for mild constipation., Disp: 90 tablet, Rfl: 1 .  traZODone (DESYREL) 150 MG tablet, Take 1 tablet (150 mg total) by mouth at bedtime as needed for sleep., Disp: 90 tablet, Rfl: 3 .  atorvastatin (LIPITOR) 80 MG tablet, Take 1 tablet (80 mg total) by mouth daily at 6 PM., Disp: 90 tablet, Rfl: 3 .  dicyclomine (BENTYL) 10 MG capsule, Take 1 capsule (10 mg total) by mouth 4 (four) times daily for 7 days., Disp: 28 capsule, Rfl: 0 .  predniSONE (DELTASONE) 20 MG tablet, Take 2 tablets (40 mg total) by mouth daily with breakfast., Disp: 10 tablet, Rfl: 0  EXAM: This is a telephone visit and thus no physical exam was completed.   ASSESSMENT AND PLAN:  Discussed the following assessment and plan:  COPD (chronic obstructive pulmonary disease) ([place]) Patient with possible COPD exacerbation.  Also discussed that I could not rule out Covid without a test.  I encouraged her to get tested for COVID-19 and quarantine at least until she has the test result back.  We will go ahead and treat with doxycycline and prednisone for possible COPD exacerbation.  If not improving she will let us know.  Bruising Possibly related to her being on Plavix.  She has had an extensive work-up recently for CBC abnormalities and I would not expect those to be contributing given her negative work-up.  I will forward my note to her hematologist to make sure that they agree with that.  Trichomonas infection Persistent symptoms possibly related to reinfection versus inadequate treatment.  We will treat again with another course of Flagyl.  I encouraged her to see if her partner could be treated with a 7-day regimen.  We will have her follow-up with her PCP in about a month to consider  retesting.   No orders of the defined types were placed in this encounter.   Meds ordered this encounter  Medications  . metroNIDAZOLE (FLAGYL) 500 MG tablet    Sig:  Take 1 tablet (500 mg total) by mouth 2 (two) times daily. With food    Dispense:  14 tablet    Refill:  0  . doxycycline (VIBRA-TABS) 100 MG tablet    Sig: Take 1 tablet (100 mg total) by mouth 2 (two) times daily. With food    Dispense:  14 tablet    Refill:  0  . predniSONE (DELTASONE) 20 MG tablet    Sig: Take 2 tablets (40 mg total) by mouth daily with breakfast.    Dispense:  10 tablet    Refill:  0     I discussed the assessment and treatment plan with the patient. The patient was provided an opportunity to ask questions and all were answered. The patient agreed with the plan and demonstrated an understanding of the instructions.   The patient was advised to call back or seek an in-person evaluation if the symptoms worsen or if the condition fails to improve as anticipated.  I provided 12 minutes of non-face-to-face time during this encounter.   Tommi Rumps, MD

## 2019-12-06 ENCOUNTER — Telehealth: Payer: Self-pay | Admitting: Family Medicine

## 2019-12-06 IMAGING — CR CHEST - 2 VIEW
1 series · 2 of 2 positions shown · non-contrast
Comparison: 11/16/2017

CLINICAL DATA: Chest pain

EXAM:
CHEST - 2 VIEW

[Series 1: dg chest 2 view · 0.14mm/px · 2 of 2 slices shown]
[im 1/2]
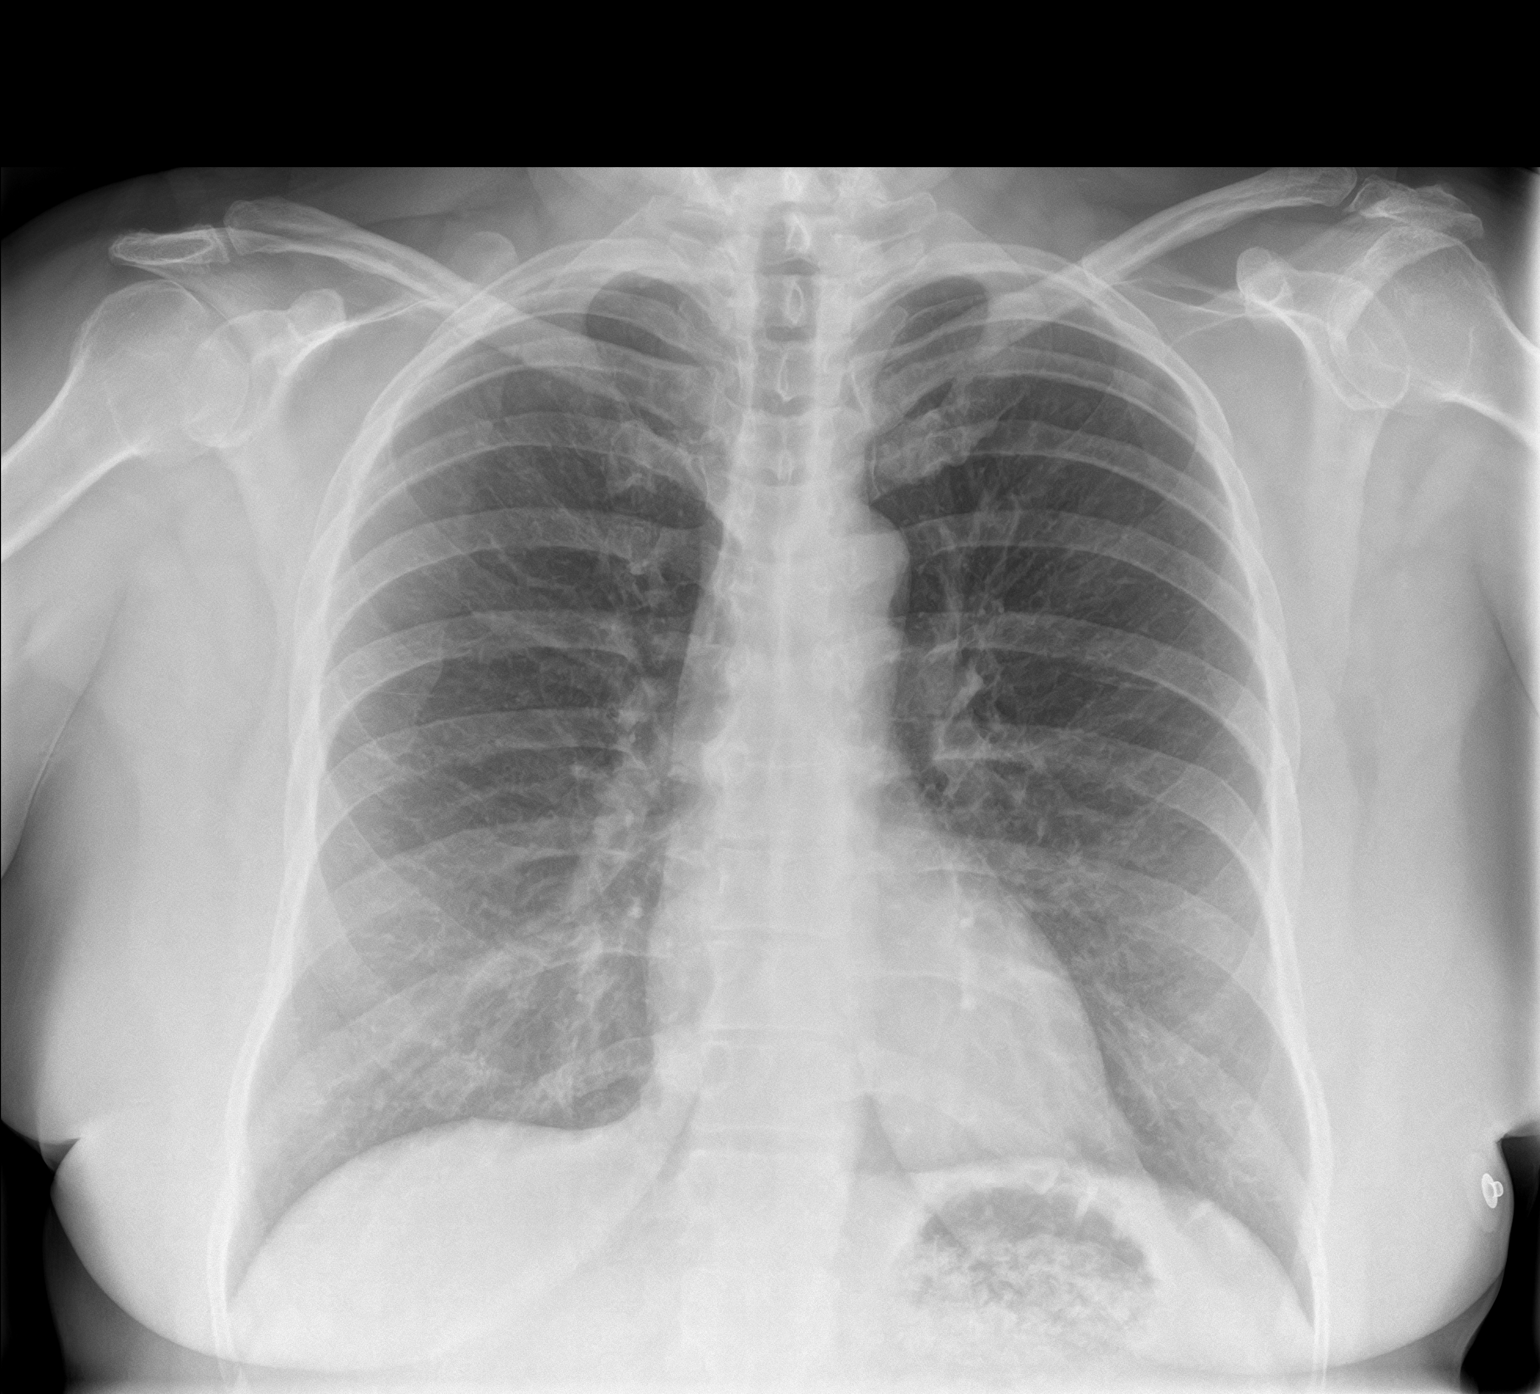
[im 2/2]
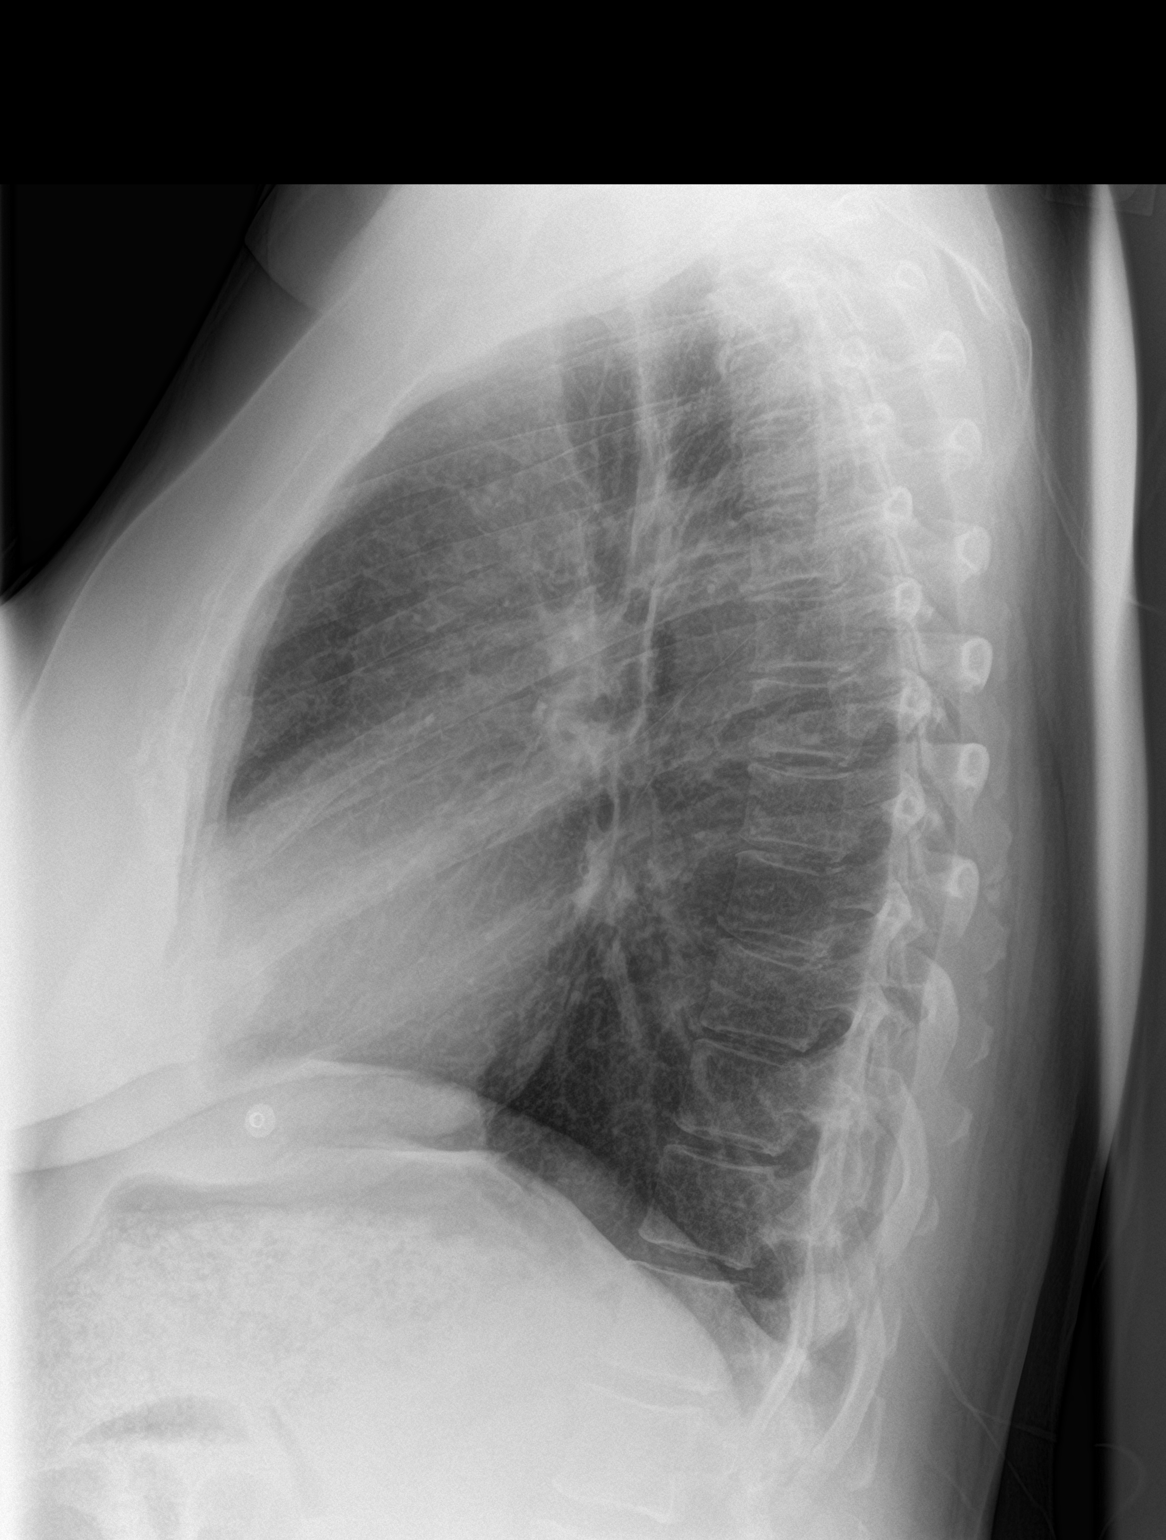

[2 of 2 positions shown; findings below may reference images not displayed]

FINDINGS: The heart size and mediastinal contours are within normal limits.
Both lungs are clear. The visualized skeletal structures are
unremarkable.
IMPRESSION: No acute abnormality of the lungs.

## 2019-12-06 NOTE — Telephone Encounter (Signed)
I called and informed the patient that the hematologist office would be calling her soon to do labs and a visit for her bruising and she understood.  Annora Guderian,cma

## 2019-12-06 NOTE — Telephone Encounter (Signed)
Please let the patient know that the hematologist office should be contacting her to get her set up for additional labs and a visit to evaluate her bruising issues. Thanks.

## 2019-12-06 NOTE — Telephone Encounter (Signed)
-----   Message from Sindy Guadeloupe, MD sent at 12/06/2019  8:24 AM EDT ----- Hello Dr. Caryl Bis,  I can do additional coags and testing for VWD and see her thereafter to see whats going on  Judeen Hammans- can you please schedule her for PT PTT/INR, VWD panel, fibrinogen and thrombin time followed by visit with me a week after?  Thank you, Archana ----- Message ----- From: Leone Haven, MD Sent: 12/05/2019   3:27 PM EDT To: Sindy Guadeloupe, MD  Hi Dr Janese Banks,   I saw [facility] for an acute visit today. She noted she was having random areas of bruising on her limbs and torso. It looks like she had a recent extensive evaluation with you for her elevated WBC and platelets and everything appeared normal. I suspect her being on plavix is contributing, though I wanted to see if you thought the bruising could have anything to do with an underlying hematologic cause for her CBC abnormalities. Thanks for your help.  Randall Hiss

## 2019-12-07 ENCOUNTER — Ambulatory Visit: Payer: Medicare Other | Attending: Internal Medicine

## 2019-12-07 DIAGNOSIS — Z20822 Contact with and (suspected) exposure to covid-19: Secondary | ICD-10-CM

## 2019-12-08 ENCOUNTER — Telehealth: Payer: Self-pay | Admitting: Internal Medicine

## 2019-12-08 LAB — NOVEL CORONAVIRUS, NAA: SARS-CoV-2, NAA: NOT DETECTED

## 2019-12-08 LAB — SARS-COV-2, NAA 2 DAY TAT

## 2019-12-08 NOTE — Telephone Encounter (Signed)
Pt saw Dr. Caryl Bis on 12/05/19. She states that she got her covid test result back today and it was negative. She is on prednisone but she is still having drainage that she is choking on. She wants to know if she can take something OTC to help combat symptoms. Please advise

## 2019-12-08 NOTE — Telephone Encounter (Signed)
Afrin x 3 days otc nasal spray only for 3 days to stop drainage  Otc antihistamine zyrtec/allegra/claritin regular w/o D  TMS

## 2019-12-08 NOTE — Telephone Encounter (Signed)
Please advise 

## 2019-12-09 ENCOUNTER — Inpatient Hospital Stay: Payer: Medicare Other

## 2019-12-09 ENCOUNTER — Other Ambulatory Visit: Payer: Self-pay | Admitting: *Deleted

## 2019-12-09 DIAGNOSIS — R238 Other skin changes: Secondary | ICD-10-CM

## 2019-12-09 DIAGNOSIS — D72829 Elevated white blood cell count, unspecified: Secondary | ICD-10-CM

## 2019-12-09 DIAGNOSIS — R233 Spontaneous ecchymoses: Secondary | ICD-10-CM

## 2019-12-09 NOTE — Telephone Encounter (Signed)
Patient informed and verbalized understanding

## 2019-12-13 ENCOUNTER — Telehealth: Payer: Self-pay | Admitting: Internal Medicine

## 2019-12-13 ENCOUNTER — Other Ambulatory Visit: Payer: Self-pay

## 2019-12-13 ENCOUNTER — Inpatient Hospital Stay: Payer: Medicare Other | Attending: Oncology

## 2019-12-13 DIAGNOSIS — R7989 Other specified abnormal findings of blood chemistry: Secondary | ICD-10-CM | POA: Insufficient documentation

## 2019-12-13 DIAGNOSIS — Z79899 Other long term (current) drug therapy: Secondary | ICD-10-CM | POA: Diagnosis not present

## 2019-12-13 DIAGNOSIS — R58 Hemorrhage, not elsewhere classified: Secondary | ICD-10-CM | POA: Diagnosis not present

## 2019-12-13 DIAGNOSIS — Z7902 Long term (current) use of antithrombotics/antiplatelets: Secondary | ICD-10-CM | POA: Insufficient documentation

## 2019-12-13 DIAGNOSIS — D72829 Elevated white blood cell count, unspecified: Secondary | ICD-10-CM | POA: Diagnosis not present

## 2019-12-13 DIAGNOSIS — F17210 Nicotine dependence, cigarettes, uncomplicated: Secondary | ICD-10-CM | POA: Insufficient documentation

## 2019-12-13 DIAGNOSIS — R233 Spontaneous ecchymoses: Secondary | ICD-10-CM

## 2019-12-13 DIAGNOSIS — R238 Other skin changes: Secondary | ICD-10-CM

## 2019-12-13 LAB — PROTIME-INR
INR: 1 (ref 0.8–1.2)
Prothrombin Time: 12.5 seconds (ref 11.4–15.2)

## 2019-12-13 LAB — APTT: aPTT: 27 seconds (ref 24–36)

## 2019-12-13 LAB — FIBRINOGEN: Fibrinogen: 382 mg/dL (ref 210–475)

## 2019-12-13 NOTE — Telephone Encounter (Signed)
Per last note:  Thressa Sheller, [facility]  11/23/2019  3:26 PM EDT    Patient informed and verbalized understanding.    She will pick up the RX and have her partner get treated also.    McLean-Scocuzza, Nino Glow, MD  11/23/2019 11:16 AM EDT    Given Flagyl 2x per day should treat trich and BV partner needs to be tx'ed with flagyl as well     Please advise

## 2019-12-13 NOTE — Telephone Encounter (Signed)
Pt states she is still having itching on her clitoris. Pt has taking all of the medication. Please advise and Thank you!  Call pt at [phone].

## 2019-12-14 LAB — VON WILLEBRAND PANEL
Coagulation Factor VIII: 152 % — ABNORMAL HIGH (ref 56–140)
Ristocetin Co-factor, Plasma: 142 % (ref 50–200)
Von Willebrand Antigen, Plasma: 153 % (ref 50–200)

## 2019-12-14 LAB — COAG STUDIES INTERP REPORT

## 2019-12-14 LAB — THROMBIN TIME: Thrombin Time: 17.2 s (ref 0.0–23.0)

## 2019-12-14 NOTE — Telephone Encounter (Signed)
Rec pt use otc monistat in the area of clitoris  Or we can try yeast pill Diflucan does she want to try that?  Rec dove unscented or mild soap   TMS

## 2019-12-15 ENCOUNTER — Ambulatory Visit
Admission: RE | Admit: 2019-12-15 | Discharge: 2019-12-15 | Disposition: A | Discharge status: Home / Self Care | Payer: Medicare Other | Source: Ambulatory Visit | Attending: Internal Medicine | Admitting: Internal Medicine

## 2019-12-15 ENCOUNTER — Other Ambulatory Visit: Payer: Self-pay | Admitting: Internal Medicine

## 2019-12-15 DIAGNOSIS — N644 Mastodynia: Secondary | ICD-10-CM | POA: Insufficient documentation

## 2019-12-15 DIAGNOSIS — B3731 Acute candidiasis of vulva and vagina: Secondary | ICD-10-CM

## 2019-12-15 DIAGNOSIS — B373 Candidiasis of vulva and vagina: Secondary | ICD-10-CM

## 2019-12-15 DIAGNOSIS — R928 Other abnormal and inconclusive findings on diagnostic imaging of breast: Secondary | ICD-10-CM | POA: Diagnosis not present

## 2019-12-15 MED ORDER — FLUCONAZOLE 150 MG PO TABS: 150.0000 mg | ORAL_TABLET | Freq: Once | ORAL | 0 refills | Status: AC

## 2019-12-15 NOTE — Telephone Encounter (Signed)
Patient informed and verbalized understanding.  She states she has gotten the OTC cream. She would also like the pill sent in.

## 2019-12-16 ENCOUNTER — Other Ambulatory Visit: Payer: Self-pay

## 2019-12-16 ENCOUNTER — Inpatient Hospital Stay (HOSPITAL_BASED_OUTPATIENT_CLINIC_OR_DEPARTMENT_OTHER): Payer: Medicare Other | Admitting: Oncology

## 2019-12-16 ENCOUNTER — Encounter: Payer: Self-pay | Admitting: Oncology

## 2019-12-16 VITALS — BP 159/80 | HR 75 | Temp 98.7°F | Wt 173.9 lb

## 2019-12-16 DIAGNOSIS — Z7902 Long term (current) use of antithrombotics/antiplatelets: Secondary | ICD-10-CM | POA: Diagnosis not present

## 2019-12-16 DIAGNOSIS — R58 Hemorrhage, not elsewhere classified: Secondary | ICD-10-CM | POA: Diagnosis not present

## 2019-12-16 DIAGNOSIS — R238 Other skin changes: Secondary | ICD-10-CM

## 2019-12-16 DIAGNOSIS — D72829 Elevated white blood cell count, unspecified: Secondary | ICD-10-CM | POA: Diagnosis not present

## 2019-12-16 DIAGNOSIS — Z79899 Other long term (current) drug therapy: Secondary | ICD-10-CM | POA: Diagnosis not present

## 2019-12-16 DIAGNOSIS — R233 Spontaneous ecchymoses: Secondary | ICD-10-CM

## 2019-12-16 DIAGNOSIS — R7989 Other specified abnormal findings of blood chemistry: Secondary | ICD-10-CM | POA: Diagnosis not present

## 2019-12-17 ENCOUNTER — Encounter: Payer: Self-pay | Admitting: Internal Medicine

## 2019-12-19 NOTE — Progress Notes (Signed)
Hematology/Oncology Consult note [facility]  [phone] [phone]  Patient Care Team: McLean-Scocuzza, Nino Glow, MD as PCP - General (Internal Medicine) End, Harrell Gave, MD as PCP - Cardiology (Cardiology)   Name of the patient: Shawna Anderson  [id number]  08-Jun-1966   Date of visit: 12/19/19  Diagnosis- 1.  Leukocytosis/thrombocytosis likely reactive 2.  Easy bruising  Chief complaint/ Reason for visit-acute visit for easy bruising  Heme/Onc history: patient is a 54 year old female with a past medical history significant for a s/p stent placement, COPD, GERD referred to me for leukocytosis/thrombocytosis.Looking back at her CBCs patient has had chronic leukocytosis at least dating back to 2016 when her white cell count fluctuates widely between 11-20. Differential has mostly show neutrophilia but at times has shown lymphocytosis and monocytosis. Hemoglobin is always been normal but most recently patient was noted to have thrombocytosis as well. Her most recent CBC from 07/18/2019 showed white cell count of 17, H&H of 13.7/40.8 and a platelet count of 550. Patient admits to smoking daily about half a pack per day. Also admits to smoking marijuana occasionally.    Interval history-patient states she has been getting small scattered bruises especially over her bilateral arms and legs which are far and few and resolved spontaneously.  She does not remember having any particular trauma associated with it.  This has been ongoing for the last 2 months.  She has not had these episodes before.  Denies any gum bleeds or nosebleeds.  She has gone through prior surgeries including hysterectomy and ovarian cyst removal and did not have any significant bleeding post surgery.  ECOG PS- 1 Pain scale- 0   Review of systems- Review of Systems  Constitutional: Positive for malaise/fatigue. Negative for chills, fever and weight loss.  HENT: Negative  for congestion, ear discharge and nosebleeds.   Eyes: Negative for blurred vision.  Respiratory: Negative for cough, hemoptysis, sputum production, shortness of breath and wheezing.   Cardiovascular: Negative for chest pain, palpitations, orthopnea and claudication.  Gastrointestinal: Negative for abdominal pain, blood in stool, constipation, diarrhea, heartburn, melena, nausea and vomiting.  Genitourinary: Negative for dysuria, flank pain, frequency, hematuria and urgency.  Musculoskeletal: Negative for back pain, joint pain and myalgias.  Skin: Negative for rash.  Neurological: Negative for dizziness, tingling, focal weakness, seizures, weakness and headaches.  Endo/Heme/Allergies: Bruises/bleeds easily.  Psychiatric/Behavioral: Negative for depression and suicidal ideas. The patient does not have insomnia.       Allergies  Allergen Reactions  . Ativan [Lorazepam]     Pt states it makes her tongue do weird things   . Latex Other (See Comments)    Patient stated that she was told by her doctor that she is "allergic to" this  . Tape Other (See Comments)    Patient stated that she was told by her doctor that she is "allergic to" this  . Xifaxan [Rifaximin]     Pt believes this is contributing to her abdominal pain/discomfort  . Drixoral Allergy Sinus [Dexbromphen-Pse-Apap Er] Rash     Past Medical History:  Diagnosis Date  . Anxiety   . Asthma   . Bipolar 1 disorder ([place])   . Bipolar disorder ([place])   . CAD (coronary artery disease)    s/p stent BMS OM Cx  . Cervical herniated disc 04/12/2016  . COPD (chronic obstructive pulmonary disease) ([place])   . Depression   . Diabetes mellitus without complication ([place])   . Diverticulitis   . GERD (  gastroesophageal reflux disease)   . Glaucoma   . History of blood transfusion   . Hyperlipidemia   . Hypertension   . OSA (obstructive sleep apnea)    not using cpap   . Plantar fasciitis    b/l feet s/p steroid shots w/o help and left  surgery w/o help   . UTI (urinary tract infection)      Past Surgical History:  Procedure Laterality Date  . ABDOMINAL HYSTERECTOMY    . ABDOMINAL SURGERY  1995   Bowel resection.  Marland Kitchen CARDIAC CATHETERIZATION N/A 04/14/2016   Procedure: Left Heart Cath and Coronary Angiography;  Surgeon: Burnell Blanks, MD;  Location: [facility];  Service: Cardiovascular;  Laterality: N/A;  . CARDIAC SURGERY    . COLONOSCOPY WITH PROPOFOL N/A 07/11/2019   Procedure: COLONOSCOPY WITH PROPOFOL;  Surgeon: Jonathon Bellows, MD;  Location: [facility] ENDOSCOPY;  Service: Gastroenterology;  Laterality: N/A;  . COLONOSCOPY WITH PROPOFOL N/A 07/29/2019   Procedure: COLONOSCOPY WITH PROPOFOL;  Surgeon: Jonathon Bellows, MD;  Location: [facility] ENDOSCOPY;  Service: Gastroenterology;  Laterality: N/A;  . CORONARY ANGIOPLASTY WITH STENT PLACEMENT  2010   Drug eluting stent  . ESOPHAGOGASTRODUODENOSCOPY (EGD) WITH PROPOFOL N/A 07/11/2019   Procedure: ESOPHAGOGASTRODUODENOSCOPY (EGD) WITH PROPOFOL;  Surgeon: Jonathon Bellows, MD;  Location: [facility] ENDOSCOPY;  Service: Gastroenterology;  Laterality: N/A;  . OVARIAN CYST REMOVAL      Social History   Socioeconomic History  . Marital status: Single    Spouse name: Not on file  . Number of children: Not on file  . Years of education: Not on file  . Highest education level: Not on file  Occupational History  . Not on file  Tobacco Use  . Smoking status: Current Every Day Smoker    Packs/day: 0.50    Years: 30.00    Pack years: 15.00    Types: Cigarettes  . Smokeless tobacco: Never Used  . Tobacco comment: refused  Substance and Sexual Activity  . Alcohol use: Not Currently    Comment: once a year  . Drug use: Yes    Frequency: 7.0 times per week    Types: Marijuana  . Sexual activity: Not on file  Other Topics Concern  . Not on file  Social History Narrative   From [place] now living in [place]    1 son    No guns    Wears seat belt   Safe in  relationship    Social Determinants of Health   Financial Resource Strain:   . Difficulty of Paying Living Expenses:   Food Insecurity:   . Worried About Charity fundraiser in the Last Year:   . Arboriculturist in the Last Year:   Transportation Needs:   . Film/video editor (Medical):   Marland Kitchen Lack of Transportation (Non-Medical):   Physical Activity:   . Days of Exercise per Week:   . Minutes of Exercise per Session:   Stress:   . Feeling of Stress :   Social Connections:   . Frequency of Communication with Friends and Family:   . Frequency of Social Gatherings with Friends and Family:   . Attends Religious Services:   . Active Member of Clubs or Organizations:   . Attends Archivist Meetings:   Marland Kitchen Marital Status:   Intimate Partner Violence:   . Fear of Current or Ex-Partner:   . Emotionally Abused:   Marland Kitchen Physically Abused:   . Sexually Abused:  Family History  Problem Relation Age of Onset  . CAD Mother   . Depression Mother   . Heart disease Mother   . Hyperlipidemia Mother   . Hypertension Mother   . CAD Brother   . Depression Brother   . Diabetes Brother   . Heart disease Brother   . Hyperlipidemia Brother   . Heart disease Father   . Alcohol abuse Father   . Lupus Other   . Sickle cell anemia Other      Current Outpatient Medications:  .  albuterol (PROVENTIL) (2.5 MG/3ML) 0.083% nebulizer solution, Take 3 mLs (2.5 mg total) by nebulization every 6 (six) hours as needed for wheezing or shortness of breath., Disp: 360 mL, Rfl: 12 .  Albuterol Sulfate (PROAIR RESPICLICK) 993 (90 Base) MCG/ACT AEPB, Inhale 1 Inhaler into the lungs every 4 (four) hours as needed., Disp: 3 each, Rfl: 4 .  ALPRAZolam (XANAX) 0.25 MG tablet, Take 1 tablet (0.25 mg total) by mouth at bedtime as needed for anxiety or sleep., Disp: 30 tablet, Rfl: 2 .  amLODipine (NORVASC) 10 MG tablet, Take 1 tablet (10 mg total) by mouth at bedtime., Disp: 90 tablet, Rfl: 3 .  aspirin  EC 81 MG tablet, Take 1 tablet (81 mg total) by mouth daily., Disp: 90 tablet, Rfl: 3 .  azithromycin (ZITHROMAX) 500 MG tablet, 2 pills today with food x 1 start doxycycline 11/16/19, Disp: 2 tablet, Rfl: 0 .  Cholecalciferol 1.25 MG (50000 UT) capsule, Take 1 capsule (50,000 Units total) by mouth once a week., Disp: 13 capsule, Rfl: 1 .  clopidogrel (PLAVIX) 75 MG tablet, Take 1 tablet (75 mg total) by mouth daily., Disp: 90 tablet, Rfl: 3 .  cyclobenzaprine (FLEXERIL) 5 MG tablet, Take 1 tablet (5 mg total) by mouth at bedtime as needed for muscle spasms., Disp: 30 tablet, Rfl: 0 .  diclofenac Sodium (VOLTAREN) 1 % GEL, Apply 2 g topically 4 (four) times daily. Prn OA and chest wall pain, Disp: 450 g, Rfl: 3 .  dicyclomine (BENTYL) 10 MG capsule, Take 1 capsule (10 mg total) by mouth 4 (four) times daily -  before meals and at bedtime., Disp: 360 capsule, Rfl: 0 .  divalproex (DEPAKOTE) 500 MG DR tablet, Take 1 tablet (500 mg total) by mouth 2 (two) times daily., Disp: 180 tablet, Rfl: 1 .  doxycycline (VIBRA-TABS) 100 MG tablet, Take 1 tablet (100 mg total) by mouth 2 (two) times daily. With food, Disp: 14 tablet, Rfl: 0 .  ezetimibe (ZETIA) 10 MG tablet, Take 1 tablet (10 mg total) by mouth daily. With lipitor 80 mg at night, Disp: 90 tablet, Rfl: 3 .  hyoscyamine (LEVSIN) 0.125 MG tablet, Take 1 tablet (0.125 mg total) by mouth every 4 (four) hours. As needed, Disp: 180 tablet, Rfl: 5 .  ipratropium-albuterol (DUONEB) 0.5-2.5 (3) MG/3ML SOLN, Take 3 mLs by nebulization every 6 (six) hours as needed., Disp: 360 mL, Rfl: 11 .  isosorbide mononitrate (IMDUR) 30 MG 24 hr tablet, Take 1 tablet (30 mg total) by mouth daily., Disp: 90 tablet, Rfl: 0 .  lidocaine (LIDODERM) 5 %, Place 1 patch onto the skin daily. Remove & Discard patch within 12 hours or as directed by MD, Disp: 30 patch, Rfl: 0 .  metroNIDAZOLE (FLAGYL) 500 MG tablet, Take 1 tablet (500 mg total) by mouth 2 (two) times daily. With food,  Disp: 14 tablet, Rfl: 0 .  nitroGLYCERIN (NITROSTAT) 0.4 MG SL tablet, Place 1 tablet (0.4 mg  total) under the tongue every 5 (five) minutes as needed for chest pain. X 2 call 911 after 3rd dose, Disp: 90 tablet, Rfl: 3 .  olmesartan-hydrochlorothiazide (BENICAR HCT) 40-25 MG tablet, Take 1 tablet by mouth daily., Disp: 90 tablet, Rfl: 3 .  ondansetron (ZOFRAN ODT) 4 MG disintegrating tablet, Take 1 tablet (4 mg total) by mouth every 8 (eight) hours as needed for nausea or vomiting., Disp: 270 tablet, Rfl: 3 .  pantoprazole (PROTONIX) 40 MG tablet, TAKE 1 TABLET BY MOUTH  TWICE DAILY BEFORE MEALS, Disp: 180 tablet, Rfl: 0 .  predniSONE (DELTASONE) 20 MG tablet, Take 2 tablets (40 mg total) by mouth daily with breakfast., Disp: 10 tablet, Rfl: 0 .  promethazine (PHENERGAN) 25 MG suppository, Place 1 suppository (25 mg total) rectally every 6 (six) hours as needed for nausea., Disp: 12 suppository, Rfl: 1 .  senna-docusate (SENOKOT-S) 8.6-50 MG tablet, Take 1-2 tablets by mouth daily as needed for mild constipation., Disp: 90 tablet, Rfl: 1 .  traZODone (DESYREL) 150 MG tablet, Take 1 tablet (150 mg total) by mouth at bedtime as needed for sleep., Disp: 90 tablet, Rfl: 3 .  atorvastatin (LIPITOR) 80 MG tablet, Take 1 tablet (80 mg total) by mouth daily at 6 PM., Disp: 90 tablet, Rfl: 3 .  dicyclomine (BENTYL) 10 MG capsule, Take 1 capsule (10 mg total) by mouth 4 (four) times daily for 7 days., Disp: 28 capsule, Rfl: 0 .  rifaximin (XIFAXAN) 550 MG TABS tablet, Take 550 mg by mouth., Disp: , Rfl:   Physical exam:  Vitals:   12/16/19 0917  BP: (!) 159/80  Pulse: 75  Temp: 98.7 F (37.1 C)  Weight: 173 lb 14.4 oz (78.9 kg)   Physical Exam Constitutional:      General: She is not in acute distress. Cardiovascular:     Rate and Rhythm: Normal rate and regular rhythm.     Heart sounds: Normal heart sounds.  Pulmonary:     Effort: Pulmonary effort is normal.     Breath sounds: Normal breath  sounds.  Abdominal:     General: Bowel sounds are normal.     Palpations: Abdomen is soft.  Skin:    General: Skin is warm and dry.     Comments: Small dime sized or smaller bruises noted over b/l arms and legs ~4-5 in total  Neurological:     Mental Status: She is alert and oriented to person, place, and time.      CMP Latest Ref Rng & Units 11/15/2019  Glucose 70 - 99 mg/dL 120(H)  BUN 6 - 23 mg/dL 19  Creatinine 0.40 - 1.20 mg/dL 0.92  Sodium 135 - 145 mEq/L 141  Potassium 3.5 - 5.1 mEq/L 3.6  Chloride 96 - 112 mEq/L 102  CO2 19 - 32 mEq/L 29  Calcium 8.4 - 10.5 mg/dL 9.9  Total Protein 6.0 - 8.3 g/dL 6.8  Total Bilirubin 0.2 - 1.2 mg/dL 0.4  Alkaline Phos 39 - 117 U/L 89  AST 0 - 37 U/L 27  ALT 0 - 35 U/L 41(H)   CBC Latest Ref Rng & Units 11/11/2019  WBC 4.0 - 10.5 K/uL 13.1(H)  Hemoglobin 12.0 - 15.0 g/dL 10.9(L)  Hematocrit 36.0 - 46.0 % 33.1(L)  Platelets 150 - 400 K/uL 357    No images are attached to the encounter.  US BREAST LTD UNI LEFT INC AXILLA  Result Date: 12/15/2019 CLINICAL DATA:  Patient describes focal pain in the 12 o'clock locations  of both breasts.She describes pain which starts in her chest and radiates into both arms. EXAM: DIGITAL DIAGNOSTIC BILATERAL MAMMOGRAM WITH CAD AND TOMO ULTRASOUND BILATERAL BREAST COMPARISON:  09/07/2012 and earlier ACR Breast Density Category b: There are scattered areas of fibroglandular density. FINDINGS: No suspicious mass, distortion, or microcalcifications are identified to suggest presence of malignancy. Spot tangential views are performed in the areas of patient's concern, showing unremarkable fibrofatty tissue. Mammographic images were processed with CAD. On physical exam, I palpate no abnormality in the UPPER central portion of the RIGHT breast or the UPPER central portion of the LEFT breast. Targeted ultrasound is performed, showing a small focus of hyperechoic tissue in the 12:30 o'clock location of the RIGHT breast,  in the area concern to the patient. This measures approximately 0.7 x 0.6 x 0.6 centimeters. There is no associated internal blood flow. The findings could represent the focal fat necrosis. The patient has no recalled trauma to the breast. On targeted ultrasound of the LEFT breast, normal appearing fibroglandular tissue in the UPPER central portion. No suspicious mass, distortion, or acoustic shadowing is demonstrated with ultrasound. IMPRESSION: 1.  No mammographic or ultrasound evidence for malignancy. 2. Possible focal area of fat necrosis in the 12:30 o'clock location of the RIGHT breast, warranting follow-up. RECOMMENDATION: Recommend RIGHT breast ultrasound in 6 months to assess stability. I have discussed the findings and recommendations with the patient. If applicable, a reminder letter will be sent to the patient regarding the next appointment. BI-RADS CATEGORY  3: Probably benign. Electronically Signed   By: Nolon Nations M.D.   On: 12/15/2019 11:18   US BREAST LTD UNI RIGHT INC AXILLA  Result Date: 12/15/2019 CLINICAL DATA:  Patient describes focal pain in the 12 o'clock locations of both breasts.She describes pain which starts in her chest and radiates into both arms. EXAM: DIGITAL DIAGNOSTIC BILATERAL MAMMOGRAM WITH CAD AND TOMO ULTRASOUND BILATERAL BREAST COMPARISON:  09/07/2012 and earlier ACR Breast Density Category b: There are scattered areas of fibroglandular density. FINDINGS: No suspicious mass, distortion, or microcalcifications are identified to suggest presence of malignancy. Spot tangential views are performed in the areas of patient's concern, showing unremarkable fibrofatty tissue. Mammographic images were processed with CAD. On physical exam, I palpate no abnormality in the UPPER central portion of the RIGHT breast or the UPPER central portion of the LEFT breast. Targeted ultrasound is performed, showing a small focus of hyperechoic tissue in the 12:30 o'clock location of the RIGHT  breast, in the area concern to the patient. This measures approximately 0.7 x 0.6 x 0.6 centimeters. There is no associated internal blood flow. The findings could represent the focal fat necrosis. The patient has no recalled trauma to the breast. On targeted ultrasound of the LEFT breast, normal appearing fibroglandular tissue in the UPPER central portion. No suspicious mass, distortion, or acoustic shadowing is demonstrated with ultrasound. IMPRESSION: 1.  No mammographic or ultrasound evidence for malignancy. 2. Possible focal area of fat necrosis in the 12:30 o'clock location of the RIGHT breast, warranting follow-up. RECOMMENDATION: Recommend RIGHT breast ultrasound in 6 months to assess stability. I have discussed the findings and recommendations with the patient. If applicable, a reminder letter will be sent to the patient regarding the next appointment. BI-RADS CATEGORY  3: Probably benign. Electronically Signed   By: Nolon Nations M.D.   On: 12/15/2019 11:18   MM DIAG BREAST TOMO BILATERAL  Result Date: 12/15/2019 CLINICAL DATA:  Patient describes focal pain in the 12 o'clock locations  of both breasts.She describes pain which starts in her chest and radiates into both arms. EXAM: DIGITAL DIAGNOSTIC BILATERAL MAMMOGRAM WITH CAD AND TOMO ULTRASOUND BILATERAL BREAST COMPARISON:  09/07/2012 and earlier ACR Breast Density Category b: There are scattered areas of fibroglandular density. FINDINGS: No suspicious mass, distortion, or microcalcifications are identified to suggest presence of malignancy. Spot tangential views are performed in the areas of patient's concern, showing unremarkable fibrofatty tissue. Mammographic images were processed with CAD. On physical exam, I palpate no abnormality in the UPPER central portion of the RIGHT breast or the UPPER central portion of the LEFT breast. Targeted ultrasound is performed, showing a small focus of hyperechoic tissue in the 12:30 o'clock location of the  RIGHT breast, in the area concern to the patient. This measures approximately 0.7 x 0.6 x 0.6 centimeters. There is no associated internal blood flow. The findings could represent the focal fat necrosis. The patient has no recalled trauma to the breast. On targeted ultrasound of the LEFT breast, normal appearing fibroglandular tissue in the UPPER central portion. No suspicious mass, distortion, or acoustic shadowing is demonstrated with ultrasound. IMPRESSION: 1.  No mammographic or ultrasound evidence for malignancy. 2. Possible focal area of fat necrosis in the 12:30 o'clock location of the RIGHT breast, warranting follow-up. RECOMMENDATION: Recommend RIGHT breast ultrasound in 6 months to assess stability. I have discussed the findings and recommendations with the patient. If applicable, a reminder letter will be sent to the patient regarding the next appointment. BI-RADS CATEGORY  3: Probably benign. Electronically Signed   By: Nolon Nations M.D.   On: 12/15/2019 11:18     Assessment and plan- Patient is a 54 y.o. female with leukocytosis/thrombocytosis likely reactive referred for easy bruising  1.  Easy bruising: I did do recent bleeding tests including PT PTT INR which were normal.  Von Willebrand panel was normal. Fibrinogen and thrombin time was also normal.  She is on Plavix and therefore platelet function assay was not performed.  Her CBC also shows a normal to at times elevated platelet count.  Most recent platelet count on 11/11/2019 was normal at 357.  Based on her tests I do not see any overt acquired bleeding disorder or problems with the coagulation system.  Also patient did not have any longstanding history of bleeding and no bleeding after hemostatic challenge from prior surgeries.  I am inclined to monitor this conservatively at this time.  If she has more episodes of bruising I will consider referring her to [facility] hematology for further advanced testing.  I will see her back in 1 month  with a repeat CBC with differential to follow-up on this  2.  Leukocytosis/thrombocytosis: Likely reactive.  Her white count mainly shows neutrophilia anaphylaxis and maintenance and so does her platelet count.  Continue to monitor   Visit Diagnosis 1. Easy bruising      Dr. Randa Evens, MD, MPH [facility] at [facility] [phone] 12/19/2019 8:23 AM

## 2019-12-20 ENCOUNTER — Ambulatory Visit: Payer: Medicare Other | Admitting: Oncology

## 2019-12-20 ENCOUNTER — Other Ambulatory Visit: Payer: Medicare Other

## 2020-01-11 ENCOUNTER — Other Ambulatory Visit (HOSPITAL_COMMUNITY)
Admission: RE | Admit: 2020-01-11 | Discharge: 2020-01-11 | Disposition: A | Discharge status: Home / Self Care | Payer: Medicare Other | Source: Ambulatory Visit | Attending: Internal Medicine | Admitting: Internal Medicine

## 2020-01-11 ENCOUNTER — Ambulatory Visit (INDEPENDENT_AMBULATORY_CARE_PROVIDER_SITE_OTHER): Payer: Medicare Other | Admitting: Internal Medicine

## 2020-01-11 ENCOUNTER — Encounter: Payer: Self-pay | Admitting: Internal Medicine

## 2020-01-11 ENCOUNTER — Telehealth: Payer: Self-pay | Admitting: Internal Medicine

## 2020-01-11 ENCOUNTER — Other Ambulatory Visit: Payer: Self-pay

## 2020-01-11 VITALS — BP 130/80 | HR 78 | Temp 98.2°F | Ht 63.0 in | Wt 171.2 lb

## 2020-01-11 DIAGNOSIS — N760 Acute vaginitis: Secondary | ICD-10-CM | POA: Insufficient documentation

## 2020-01-11 DIAGNOSIS — M545 Low back pain, unspecified: Secondary | ICD-10-CM

## 2020-01-11 DIAGNOSIS — R58 Hemorrhage, not elsewhere classified: Secondary | ICD-10-CM | POA: Insufficient documentation

## 2020-01-11 DIAGNOSIS — M25561 Pain in right knee: Secondary | ICD-10-CM

## 2020-01-11 DIAGNOSIS — G8929 Other chronic pain: Secondary | ICD-10-CM

## 2020-01-11 DIAGNOSIS — Z113 Encounter for screening for infections with a predominantly sexual mode of transmission: Secondary | ICD-10-CM | POA: Insufficient documentation

## 2020-01-11 DIAGNOSIS — I10 Essential (primary) hypertension: Secondary | ICD-10-CM

## 2020-01-11 DIAGNOSIS — K5900 Constipation, unspecified: Secondary | ICD-10-CM | POA: Diagnosis not present

## 2020-01-11 DIAGNOSIS — M25562 Pain in left knee: Secondary | ICD-10-CM

## 2020-01-11 DIAGNOSIS — J309 Allergic rhinitis, unspecified: Secondary | ICD-10-CM

## 2020-01-11 MED ORDER — LEVOCETIRIZINE DIHYDROCHLORIDE 5 MG PO TABS: 5.0000 mg | ORAL_TABLET | Freq: Every evening | ORAL | 3 refills | Status: DC | PRN

## 2020-01-11 MED ORDER — SALINE SPRAY 0.65 % NA SOLN: 2.0000 | NASAL | 3 refills | Status: DC | PRN

## 2020-01-11 MED ORDER — SENNOSIDES-DOCUSATE SODIUM 8.6-50 MG PO TABS: 1.0000 | ORAL_TABLET | Freq: Every day | ORAL | 3 refills | Status: DC | PRN

## 2020-01-11 MED ORDER — IPRATROPIUM BROMIDE 0.06 % NA SOLN: 2.0000 | Freq: Three times a day (TID) | NASAL | 3 refills | Status: DC

## 2020-01-11 MED ORDER — CYCLOBENZAPRINE HCL 5 MG PO TABS: 5.0000 mg | ORAL_TABLET | Freq: Every evening | ORAL | 3 refills | Status: DC | PRN

## 2020-01-11 NOTE — Telephone Encounter (Signed)
Patient saw Dr. Aundra Dubin yeaterday. She wanted her to know that her 2nd covid injection was on 11/18/19, lot #[id number].

## 2020-01-11 NOTE — Progress Notes (Signed)
Chief Complaint  Patient presents with  . Follow-up   F/u  1. HTN controlled on norvasc 10, benicar 40-25 2. Allergies c/o am drainage in throat and cough with h/o allergies has tried benadryl and other otc meds in the past w/o relief  3. Bruises to left shin and left forearm and right lower leg no trauma she is on plavix and aspirin  4. B/l knee pain per pt h/o chondromalacia per pt declines Xray knees today pain mild to moderate will try voltaren gel  5. Trich repeat urine labs today still with same partner unclear if he was tx'ed   Review of Systems  Constitutional: Negative for weight loss.  HENT: Negative for hearing loss.   Eyes:       Right lateral upper eyelid swelling   Respiratory: Negative for shortness of breath.   Cardiovascular: Negative for chest pain.  Gastrointestinal: Negative for abdominal pain.  Musculoskeletal: Positive for joint pain.  Skin: Negative for rash.  Neurological: Negative for headaches.  Psychiatric/Behavioral: Negative for depression.   Past Medical History:  Diagnosis Date  . Anxiety   . Asthma   . Bipolar 1 disorder ([place])   . Bipolar disorder ([place])   . CAD (coronary artery disease)    s/p stent BMS OM Cx  . Cervical herniated disc 04/12/2016  . COPD (chronic obstructive pulmonary disease) ([place])   . Depression   . Diabetes mellitus without complication ([place])   . Diverticulitis   . GERD (gastroesophageal reflux disease)   . Glaucoma   . History of blood transfusion   . Hyperlipidemia   . Hypertension   . OSA (obstructive sleep apnea)    not using cpap   . Plantar fasciitis    b/l feet s/p steroid shots w/o help and left surgery w/o help   . UTI (urinary tract infection)    Past Surgical History:  Procedure Laterality Date  . ABDOMINAL HYSTERECTOMY    . ABDOMINAL SURGERY  1995   Bowel resection.  Marland Kitchen CARDIAC CATHETERIZATION N/A 04/14/2016   Procedure: Left Heart Cath and Coronary Angiography;  Surgeon: Burnell Blanks, MD;   Location: [facility];  Service: Cardiovascular;  Laterality: N/A;  . CARDIAC SURGERY    . COLONOSCOPY WITH PROPOFOL N/A 07/11/2019   Procedure: COLONOSCOPY WITH PROPOFOL;  Surgeon: Jonathon Bellows, MD;  Location: [facility] ENDOSCOPY;  Service: Gastroenterology;  Laterality: N/A;  . COLONOSCOPY WITH PROPOFOL N/A 07/29/2019   Procedure: COLONOSCOPY WITH PROPOFOL;  Surgeon: Jonathon Bellows, MD;  Location: [facility] ENDOSCOPY;  Service: Gastroenterology;  Laterality: N/A;  . CORONARY ANGIOPLASTY WITH STENT PLACEMENT  2010   Drug eluting stent  . ESOPHAGOGASTRODUODENOSCOPY (EGD) WITH PROPOFOL N/A 07/11/2019   Procedure: ESOPHAGOGASTRODUODENOSCOPY (EGD) WITH PROPOFOL;  Surgeon: Jonathon Bellows, MD;  Location: [facility] ENDOSCOPY;  Service: Gastroenterology;  Laterality: N/A;  . OVARIAN CYST REMOVAL     Family History  Problem Relation Age of Onset  . CAD Mother   . Depression Mother   . Heart disease Mother   . Hyperlipidemia Mother   . Hypertension Mother   . CAD Brother   . Depression Brother   . Diabetes Brother   . Heart disease Brother   . Hyperlipidemia Brother   . Heart disease Father   . Alcohol abuse Father   . Lupus Other   . Sickle cell anemia Other    Social History   Socioeconomic History  . Marital status: Single    Spouse name: Not on file  . Number  of children: Not on file  . Years of education: Not on file  . Highest education level: Not on file  Occupational History  . Not on file  Tobacco Use  . Smoking status: Current Every Day Smoker    Packs/day: 0.50    Years: 30.00    Pack years: 15.00    Types: Cigarettes  . Smokeless tobacco: Never Used  . Tobacco comment: refused  Vaping Use  . Vaping Use: Never used  Substance and Sexual Activity  . Alcohol use: Not Currently    Comment: once a year  . Drug use: Yes    Frequency: 7.0 times per week    Types: Marijuana  . Sexual activity: Not on file  Other Topics Concern  . Not on file  Social History Narrative   From [place] now living in [place]    1 son    No guns    Wears seat belt   Safe in relationship    Social Determinants of Health   Financial Resource Strain:   . Difficulty of Paying Living Expenses:   Food Insecurity:   . Worried About Charity fundraiser in the Last Year:   . Arboriculturist in the Last Year:   Transportation Needs:   . Film/video editor (Medical):   Marland Kitchen Lack of Transportation (Non-Medical):   Physical Activity:   . Days of Exercise per Week:   . Minutes of Exercise per Session:   Stress:   . Feeling of Stress :   Social Connections:   . Frequency of Communication with Friends and Family:   . Frequency of Social Gatherings with Friends and Family:   . Attends Religious Services:   . Active Member of Clubs or Organizations:   . Attends Archivist Meetings:   Marland Kitchen Marital Status:   Intimate Partner Violence:   . Fear of Current or Ex-Partner:   . Emotionally Abused:   Marland Kitchen Physically Abused:   . Sexually Abused:    Current Meds  Medication Sig  . albuterol (PROVENTIL) (2.5 MG/3ML) 0.083% nebulizer solution Take 3 mLs (2.5 mg total) by nebulization every 6 (six) hours as needed for wheezing or shortness of breath.  . Albuterol Sulfate (PROAIR RESPICLICK) 542 (90 Base) MCG/ACT AEPB Inhale 1 Inhaler into the lungs every 4 (four) hours as needed.  . ALPRAZolam (XANAX) 0.25 MG tablet Take 1 tablet (0.25 mg total) by mouth at bedtime as needed for anxiety or sleep.  Marland Kitchen amLODipine (NORVASC) 10 MG tablet Take 1 tablet (10 mg total) by mouth at bedtime.  Marland Kitchen aspirin EC 81 MG tablet Take 1 tablet (81 mg total) by mouth daily.  Marland Kitchen atorvastatin (LIPITOR) 80 MG tablet Take 1 tablet (80 mg total) by mouth daily at 6 PM.  . Cholecalciferol 1.25 MG (50000 UT) capsule Take 1 capsule (50,000 Units total) by mouth once a week.  . clopidogrel (PLAVIX) 75 MG tablet Take 1 tablet (75 mg total) by mouth daily.  . cyclobenzaprine (FLEXERIL) 5 MG tablet Take 1 tablet (5 mg  total) by mouth at bedtime as needed for muscle spasms.  . diclofenac Sodium (VOLTAREN) 1 % GEL Apply 2 g topically 4 (four) times daily. Prn OA and chest wall pain  . dicyclomine (BENTYL) 10 MG capsule Take 1 capsule (10 mg total) by mouth 4 (four) times daily -  before meals and at bedtime.  . divalproex (DEPAKOTE) 500 MG DR tablet Take 1 tablet (500  mg total) by mouth 2 (two) times daily.  . hyoscyamine (LEVSIN) 0.125 MG tablet Take 1 tablet (0.125 mg total) by mouth every 4 (four) hours. As needed  . isosorbide mononitrate (IMDUR) 30 MG 24 hr tablet Take 1 tablet (30 mg total) by mouth daily.  Marland Kitchen lidocaine (LIDODERM) 5 % Place 1 patch onto the skin daily. Remove & Discard patch within 12 hours or as directed by MD  . nitroGLYCERIN (NITROSTAT) 0.4 MG SL tablet Place 1 tablet (0.4 mg total) under the tongue every 5 (five) minutes as needed for chest pain. X 2 call 911 after 3rd dose  . olmesartan-hydrochlorothiazide (BENICAR HCT) 40-25 MG tablet Take 1 tablet by mouth daily.  . ondansetron (ZOFRAN ODT) 4 MG disintegrating tablet Take 1 tablet (4 mg total) by mouth every 8 (eight) hours as needed for nausea or vomiting.  . pantoprazole (PROTONIX) 40 MG tablet TAKE 1 TABLET BY MOUTH  TWICE DAILY BEFORE MEALS  . senna-docusate (SENOKOT-S) 8.6-50 MG tablet Take 1-2 tablets by mouth daily as needed for mild constipation.  . traZODone (DESYREL) 150 MG tablet Take 1 tablet (150 mg total) by mouth at bedtime as needed for sleep.  . [DISCONTINUED] cyclobenzaprine (FLEXERIL) 5 MG tablet Take 1 tablet (5 mg total) by mouth at bedtime as needed for muscle spasms.  . [DISCONTINUED] senna-docusate (SENOKOT-S) 8.6-50 MG tablet Take 1-2 tablets by mouth daily as needed for mild constipation.   Allergies  Allergen Reactions  . Ativan [Lorazepam]     Pt states it makes her tongue do weird things   . Latex Other (See Comments)    Patient stated that she was told by her doctor that she is "allergic to" this  .  Tape Other (See Comments)    Patient stated that she was told by her doctor that she is "allergic to" this  . Xifaxan [Rifaximin]     Pt believes this is contributing to her abdominal pain/discomfort  . Drixoral Allergy Sinus [Dexbromphen-Pse-Apap Er] Rash   Recent Results (from the past 2160 hour(s))  CBC with Differential     Status: Abnormal   Collection Time: 11/11/19  3:02 PM  Result Value Ref Range   WBC 13.1 (H) 4.0 - 10.5 K/uL   RBC 3.69 (L) 3.87 - 5.11 MIL/uL   Hemoglobin 10.9 (L) 12.0 - 15.0 g/dL   HCT 33.1 (L) 36 - 46 %   MCV 89.7 80.0 - 100.0 fL   MCH 29.5 26.0 - 34.0 pg   MCHC 32.9 30.0 - 36.0 g/dL   RDW 14.1 11.5 - 15.5 %   Platelets 357 150 - 400 K/uL   nRBC 0.0 0.0 - 0.2 %   Neutrophils Relative % 61 %   Neutro Abs 8.1 (H) 1.7 - 7.7 K/uL   Lymphocytes Relative 28 %   Lymphs Abs 3.7 0.7 - 4.0 K/uL   Monocytes Relative 7 %   Monocytes Absolute 0.9 0 - 1 K/uL   Eosinophils Relative 3 %   Eosinophils Absolute 0.3 0 - 0 K/uL   Basophils Relative 1 %   Basophils Absolute 0.1 0 - 0 K/uL   Immature Granulocytes 0 %   Abs Immature Granulocytes 0.05 0.00 - 0.07 K/uL    Comment: Performed at [facility], [address] [place], [place] [address]  Urine cytology ancillary only([place])     Status: Abnormal   Collection Time: 11/15/19  3:35 PM  Result Value Ref Range   Neisseria Gonorrhea Negative  Chlamydia Negative    Trichomonas Positive (A)    Bacterial Vaginitis-Urine Positive (A)    Bacterial Vaginitis-Urine Positive (A)    Candida Urine Negative    Comment Normal Reference Ranger Chlamydia - Negative    Comment      Normal Reference Range Neisseria Gonorrhea - Negative   Comment Normal Reference Range Trichomonas - Negative   HIV antibody (with reflex)     Status: None   Collection Time: 11/15/19  3:37 PM  Result Value Ref Range   HIV 1&2 Ab, 4th Generation NON-REACTIVE NON-REACTI    Comment: HIV-1 antigen and HIV-1/HIV-2 antibodies were not  detected. There is no laboratory evidence of HIV infection. Marland Kitchen PLEASE NOTE: This information has been disclosed to you from records whose confidentiality may be protected by state law.  If your state requires such protection, then the state law prohibits you from making any further disclosure of the information without the specific written consent of the person to whom it pertains, or as otherwise permitted by law. A general authorization for the release of medical or other information is NOT sufficient for this purpose. . For additional information please refer to http://education.[facility].com/faq/FAQ106 (This link is being provided for informational/ educational purposes only.) . Marland Kitchen The performance of this assay has not been clinically validated in patients less than 19 years old. .   Hepatitis C antibody     Status: None   Collection Time: 11/15/19  3:37 PM  Result Value Ref Range   Hepatitis C Ab NON-REACTIVE NON-REACTI   SIGNAL TO CUT-OFF 0.01 <1.00    Comment: . HCV antibody was non-reactive. There is no laboratory  evidence of HCV infection. . In most cases, no further action is required. However, if recent HCV exposure is suspected, a test for HCV RNA (test code [id number]) is suggested. . For additional information please refer to http://education.[facility].com/faq/FAQ22v1 (This link is being provided for informational/ educational purposes only.) .   Hepatitis B surface antibody,quantitative     Status: None   Collection Time: 11/15/19  3:37 PM  Result Value Ref Range   Hepatitis B-Post 27 > OR = 10 mIU/mL    Comment: . Patient has immunity to hepatitis B virus. . For additional information, please refer to http://education.[facility].com/faq/FAQ105 (This link is being provided for informational/ educational purposes only).   Hepatitis B Surface AntiGEN     Status: None   Collection Time: 11/15/19  3:37 PM  Result Value Ref Range    Hepatitis B Surface Ag NON-REACTIVE NON-REACTI  HSV(herpes smplx)abs-1+2(IgG+IgM)-bld     Status: Abnormal   Collection Time: 11/15/19  3:37 PM  Result Value Ref Range   HSVI/II Comb IgM <0.91 0.00 - 0.90 Ratio    Comment:                                  Negative        <0.91                                  Equivocal 0.91 - 1.09                                  Positive        >1.09    HSV 1 Glycoprotein G Ab, IgG 60.70 (H)  0.00 - 0.90 index    Comment:                                  Negative        <0.91                                  Equivocal 0.91 - 1.09                                  Positive        >1.09  Note: Negative indicates no antibodies detected to  HSV-1. Equivocal may suggest early infection.  If  clinically appropriate, retest at later date. Positive  indicates antibodies detected to HSV-1.    HSV 2 IgG, Type Spec <0.91 0.00 - 0.90 index    Comment:                                  Negative        <0.91                                  Equivocal 0.91 - 1.09                                  Positive        >1.09  Note: Negative indicates no antibodies detected to  HSV-2. Equivocal may suggest early infection.  If  clinically appropriate, retest at later date. Positive  indicates antibodies detected to HSV-2.   Comprehensive metabolic panel     Status: Abnormal   Collection Time: 11/15/19  3:52 PM  Result Value Ref Range   Sodium 141 135 - 145 mEq/L   Potassium 3.6 3.5 - 5.1 mEq/L   Chloride 102 96 - 112 mEq/L   CO2 29 19 - 32 mEq/L   Glucose, Bld 120 (H) 70 - 99 mg/dL   BUN 19 6 - 23 mg/dL   Creatinine, Ser 0.92 0.40 - 1.20 mg/dL   Total Bilirubin 0.4 0.2 - 1.2 mg/dL   Alkaline Phosphatase 89 39 - 117 U/L   AST 27 0 - 37 U/L   ALT 41 (H) 0 - 35 U/L   Total Protein 6.8 6.0 - 8.3 g/dL   Albumin 4.4 3.5 - 5.2 g/dL   GFR 63.97 >60.00 mL/min   Calcium 9.9 8.4 - 10.5 mg/dL  Magnesium     Status: None   Collection Time: 11/15/19  3:52 PM  Result Value Ref  Range   Magnesium 2.3 1.5 - 2.5 mg/dL  Hemoglobin A1c     Status: None   Collection Time: 11/15/19  3:52 PM  Result Value Ref Range   Hgb A1c MFr Bld 6.2 4.6 - 6.5 %    Comment: Glycemic Control Guidelines for People with Diabetes:Non Diabetic:  <6%Goal of Therapy: <7%Additional Action Suggested:  >8%   Vitamin D (25 hydroxy)     Status: Abnormal   Collection Time: 11/15/19  3:52 PM  Result Value Ref Range   VITD 18.63 (L) 30.00 - 100.00 ng/mL  RPR  Status: None   Collection Time: 11/15/19  3:52 PM  Result Value Ref Range   RPR Ser Ql NON-REACTIVE NON-REACTI  Hepatitis A Ab, Total     Status: None   Collection Time: 11/15/19  3:52 PM  Result Value Ref Range   Hepatitis A AB,Total NON-REACTIVE NON-REACTI    Comment: . For additional information, please refer to  http://education.[facility].com/faq/FAQ202  (This link is being provided for informational/ educational purposes only.) .   Hepatitis A antibody, IgM     Status: None   Collection Time: 11/15/19  3:52 PM  Result Value Ref Range   Hep A IgM NON-REACTIVE NON-REACTI    Comment: . For additional information, please refer to  http://education.[facility].com/faq/FAQ202  (This link is being provided for informational/ educational purposes only.) .   Novel Coronavirus, NAA ([facility])     Status: None   Collection Time: 12/07/19  8:04 AM   Specimen: Oropharyngeal(OP) collection in vial transport medium   OROPHARYNGEA  TESTING  Result Value Ref Range   SARS-CoV-2, NAA Not Detected Not Detected    Comment: This nucleic acid amplification test was developed and its performance characteristics determined by [employer]. Nucleic acid amplification tests include RT-PCR and TMA. This test has not been FDA cleared or approved. This test has been authorized by FDA under an Emergency Use Authorization (EUA). This test is only authorized for the duration of time the declaration that circumstances exist  justifying the authorization of the emergency use of in vitro diagnostic tests for detection of SARS-CoV-2 virus and/or diagnosis of COVID-19 infection under section 564(b)(1) of the Act, 21 U.S.C. 656CLE-7(N) (1), unless the authorization is terminated or revoked sooner. When diagnostic testing is negative, the possibility of a false negative result should be considered in the context of a patient's recent exposures and the presence of clinical signs and symptoms consistent with COVID-19. An individual without symptoms of COVID-19 and who is not shedding SARS-CoV-2 virus wo uld expect to have a negative (not detected) result in this assay.   SARS-COV-2, NAA 2 DAY TAT     Status: None   Collection Time: 12/07/19  8:04 AM   OROPHARYNGEA  TESTING  Result Value Ref Range   SARS-CoV-2, NAA 2 DAY TAT Performed   Von Willebrand panel     Status: Abnormal   Collection Time: 12/13/19 10:58 AM  Result Value Ref Range   Coagulation Factor VIII 152 (H) 56 - 140 %   Ristocetin Co-factor, Plasma 142 50 - 200 %    Comment: (NOTE) Performed At: [facility] [place], [place] [id number] Rush Farmer MD [id number]    Von Willebrand Antigen, Plasma 153 50 - 200 %    Comment: (NOTE) This test was developed and its performance characteristics determined by [facility]. It has not been cleared or approved by the Food and Drug Administration.   Thrombin time     Status: None   Collection Time: 12/13/19 10:58 AM  Result Value Ref Range   Thrombin Time 17.2 0.0 - 23.0 sec    Comment: (NOTE) Performed At: [facility] [place], [place] [id number] Rush Farmer MD [id number]   APTT     Status: None   Collection Time: 12/13/19 10:58 AM  Result Value Ref Range   aPTT 27 24 - 36 seconds    Comment: Performed at [facility], [address] [place], [place] [address]  Fibrinogen     Status: None   Collection Time: 12/13/19  10:58 AM   Result Value Ref Range   Fibrinogen 382 210 - 475 mg/dL    Comment: Performed at [facility], [address] [place], [place] [address]  Protime-INR     Status: None   Collection Time: 12/13/19 10:58 AM  Result Value Ref Range   Prothrombin Time 12.5 11.4 - 15.2 seconds   INR 1.0 0.8 - 1.2    Comment: (NOTE) INR goal varies based on device and disease states. Performed at [facility], [address] [place],  [address]   Coag Studies Interp Report     Status: None   Collection Time: 12/13/19 10:58 AM  Result Value Ref Range   Interpretation Note     Comment: (NOTE) ------------------------------- COAGULATION: VON WILLEBRAND FACTOR ASSESSMENT CURRENT RESULTS ASSESSMENT The VWF:Ag is normal. The VWF:RCo is normal. The FVIII is elevated. VON WILLEBRAND FACTOR ASSESSMENT CURRENT RESULTS INTERPRETATION - These results are not consistent with a diagnosis of VWD according to the current NHLBI guideline. Persistently elevated FVIII activity is a risk factor for venous thrombosis as well as recurrence of venous thrombosis. Risk is graded and increases with the degree of elevation. Although elevated FVIII activity has been identified to cluster within families, a genetic basis for the elevation has not yet been elucidated (Br J Haematol. 2012; 157(6):653-663). VON WILLEBRAND FACTOR ASSESSMENT - Results may be falsely elevated and possibly falsely normal as VWF and FVIII may increase in samples drawn from patients (particularly children) who are visibly stressed at the time of phlebotomy, as acute phase reactants, or in respons e to certain drug therapies such as desmopressin. Repeat testing may be necessary before excluding a diagnosis of VWD especially if the clinical suspicion is high for an underlying bleeding disorder. The setting for phlebotomy should be as calm as possible and patients should be encouraged to sit quietly prior to the blood  draw. VON WILLEBRAND FACTOR ASSESSMENT DEFINITIONS - VWD - von Willebrand disease; VWF - von Willebrand factor; VWF:Ag - VWF antigen; VWF:RCo - VWF ristocetin cofactor activity; FVIII - factor VIII activity. MEDICAL DIRECTOR: For questions regarding panel interpretation, please contact Jake Bathe, M.D. at [facility] at [phone]. ------------------------------- DISCLAIMER These assessments and interpretations are provided as a convenience in support of the physician-patient relationship and are not intended to replace the physician's clinical judgment. They are derived from national guidelines in addition to other evide nce and expert opinion. The clinician should consider this information within the context of clinical opinion and the individual patient. SEE GUIDANCE FOR VON WILLEBRAND FACTOR ASSESSMENT: (1) The National Heart, Lung and Blood Institute. The Diagnosis, Evaluation and Management of von Willebrand Disease. Janeal Holmes, MD: [place] Publication 89-2119. 4174. Available at vSpecials.com.pt. (2) Daryl Eastern et al. [place] J Hematol. 2009; 84(6):366-370. (3) [place]. 2004;10(3):199-217. (4) Pasi KJ et al. Haemophilia. 2004; 10(3):218-231. Performed At: [facility] [place], [place] [id number] Thomasene Ripple MD [id number]    Objective  Body mass index is 30.33 kg/m. Wt Readings from Last 3 Encounters:  01/11/20 171 lb 3.2 oz (77.7 kg)  12/16/19 173 lb 14.4 oz (78.9 kg)  12/05/19 178 lb (80.7 kg)   Temp Readings from Last 3 Encounters:  01/11/20 98.2 F (36.8 C) (Oral)  12/16/19 98.7 F (37.1 C)  11/15/19 97.8 F (36.6 C) (Temporal)   BP Readings from Last 3 Encounters:  01/11/20 130/80  12/16/19 (!) 159/80  11/15/19 124/70   Pulse  Readings from Last 3 Encounters:  01/11/20 78  12/16/19 75  11/15/19 99    Physical Exam Vitals and  nursing note reviewed.  Constitutional:      Appearance: Normal appearance. She is well-developed and well-groomed.  HENT:     Head: Normocephalic and atraumatic.  Eyes:     Conjunctiva/sclera: Conjunctivae normal.     Pupils: Pupils are equal, round, and reactive to light.   Cardiovascular:     Rate and Rhythm: Normal rate and regular rhythm.     Heart sounds: Normal heart sounds. No murmur heard.   Pulmonary:     Effort: Pulmonary effort is normal.     Breath sounds: Normal breath sounds.  Abdominal:     General: Abdomen is flat. Bowel sounds are normal.     Tenderness: There is no abdominal tenderness.  Skin:    General: Skin is warm and dry.  Neurological:     General: No focal deficit present.     Mental Status: She is alert and oriented to person, place, and time. Mental status is at baseline.     Gait: Gait normal.  Psychiatric:        Attention and Perception: Attention and perception normal.        Mood and Affect: Mood and affect normal.        Speech: Speech normal.        Behavior: Behavior normal. Behavior is cooperative.        Thought Content: Thought content normal.        Cognition and Memory: Cognition and memory normal.        Judgment: Judgment normal.     Assessment  Plan  Chronic pain of both knees Declines Xray today consider future   Screening for venereal disease - Plan: Urine cytology ancillary only([place])  Acute vaginitis - Plan: Urine cytology ancillary only([place])  Bilateral low back pain, unspecified chronicity, unspecified whether sciatica present - Plan: cyclobenzaprine (FLEXERIL) 5 MG tablet  Constipation, unspecified constipation type - Plan: senna-docusate (SENOKOT-S) 8.6-50 MG tablet  Allergic rhinitis, unspecified seasonality, unspecified trigger Xyzal, NS. Atrovent  Ecchymosis Likely 2/2 plavix/aspirn   Essential hypertension  Controlled cont meds  HM Declines flu shot  Tdapwill do in future Consider pna  24, shingrix and in future if has not had covid had 2/2 bring covid card or my chart this for 2nd dose date  S/p hysterectomy will ask at f/u if had abnormal pap? If left ovary still intact right ovary appears out per imaging  -[facility]  Dr. Vicente Males Kirancolonoscopyhad12/28/20 and 1/15/21and EGDhad 07/11/19 with path no malignancy concern H/o sigmoid resection in past for h/o diverticulitis  -referredmammogramhas not scheduled yetas of11/12/2020encouraged to schedule -given # again to call and sch as of 08/18/19 for mammogram 12/15/19  IMPRESSION: 1.  No mammographic or ultrasound evidence for malignancy. 2. Possible focal area of fat necrosis in the 12:30 o'clock location of the RIGHT breast, warranting follow-up.  RECOMMENDATION: Recommend RIGHT breast ultrasound in 6 months to assess stability.  rec smoking cessation smoking < or = 0.5 ppd also using THC since age 75 y.o   Provider: Dr. Olivia Mackie [facility]

## 2020-01-11 NOTE — Telephone Encounter (Signed)
Noted. Patient chart has been updated to reflect this.

## 2020-01-11 NOTE — Patient Instructions (Signed)

## 2020-01-12 LAB — URINE CYTOLOGY ANCILLARY ONLY
Bacterial Vaginitis-Urine: NEGATIVE
Comment: NEGATIVE
Trichomonas: NEGATIVE

## 2020-01-13 ENCOUNTER — Other Ambulatory Visit: Payer: Self-pay

## 2020-01-13 ENCOUNTER — Inpatient Hospital Stay: Payer: Medicare Other | Attending: Oncology

## 2020-01-13 DIAGNOSIS — D72829 Elevated white blood cell count, unspecified: Secondary | ICD-10-CM | POA: Diagnosis not present

## 2020-01-13 DIAGNOSIS — F17210 Nicotine dependence, cigarettes, uncomplicated: Secondary | ICD-10-CM | POA: Diagnosis not present

## 2020-01-13 DIAGNOSIS — I2510 Atherosclerotic heart disease of native coronary artery without angina pectoris: Secondary | ICD-10-CM | POA: Diagnosis not present

## 2020-01-13 DIAGNOSIS — J449 Chronic obstructive pulmonary disease, unspecified: Secondary | ICD-10-CM | POA: Insufficient documentation

## 2020-01-13 DIAGNOSIS — F419 Anxiety disorder, unspecified: Secondary | ICD-10-CM | POA: Diagnosis not present

## 2020-01-13 DIAGNOSIS — I10 Essential (primary) hypertension: Secondary | ICD-10-CM | POA: Insufficient documentation

## 2020-01-13 DIAGNOSIS — E119 Type 2 diabetes mellitus without complications: Secondary | ICD-10-CM | POA: Diagnosis not present

## 2020-01-13 DIAGNOSIS — G4733 Obstructive sleep apnea (adult) (pediatric): Secondary | ICD-10-CM | POA: Diagnosis not present

## 2020-01-13 DIAGNOSIS — E785 Hyperlipidemia, unspecified: Secondary | ICD-10-CM | POA: Insufficient documentation

## 2020-01-13 DIAGNOSIS — K219 Gastro-esophageal reflux disease without esophagitis: Secondary | ICD-10-CM | POA: Diagnosis not present

## 2020-01-13 DIAGNOSIS — R233 Spontaneous ecchymoses: Secondary | ICD-10-CM

## 2020-01-13 DIAGNOSIS — Z79899 Other long term (current) drug therapy: Secondary | ICD-10-CM | POA: Diagnosis not present

## 2020-01-13 DIAGNOSIS — F319 Bipolar disorder, unspecified: Secondary | ICD-10-CM | POA: Diagnosis not present

## 2020-01-13 DIAGNOSIS — F1290 Cannabis use, unspecified, uncomplicated: Secondary | ICD-10-CM | POA: Insufficient documentation

## 2020-01-13 DIAGNOSIS — R238 Other skin changes: Secondary | ICD-10-CM

## 2020-01-13 DIAGNOSIS — H409 Unspecified glaucoma: Secondary | ICD-10-CM | POA: Insufficient documentation

## 2020-01-13 DIAGNOSIS — D649 Anemia, unspecified: Secondary | ICD-10-CM | POA: Diagnosis not present

## 2020-01-13 LAB — CBC WITH DIFFERENTIAL/PLATELET
Abs Immature Granulocytes: 0.05 10*3/uL (ref 0.00–0.07)
Basophils Absolute: 0 10*3/uL (ref 0.0–0.1)
Basophils Relative: 0 %
Eosinophils Absolute: 0.1 10*3/uL (ref 0.0–0.5)
Eosinophils Relative: 0 %
HCT: 40.4 % (ref 36.0–46.0)
Hemoglobin: 13.6 g/dL (ref 12.0–15.0)
Immature Granulocytes: 0 %
Lymphocytes Relative: 22 %
Lymphs Abs: 3 10*3/uL (ref 0.7–4.0)
MCH: 30 pg (ref 26.0–34.0)
MCHC: 33.7 g/dL (ref 30.0–36.0)
MCV: 89.2 fL (ref 80.0–100.0)
Monocytes Absolute: 0.6 10*3/uL (ref 0.1–1.0)
Monocytes Relative: 5 %
Neutro Abs: 9.5 10*3/uL — ABNORMAL HIGH (ref 1.7–7.7)
Neutrophils Relative %: 73 %
Platelets: 444 10*3/uL — ABNORMAL HIGH (ref 150–400)
RBC: 4.53 MIL/uL (ref 3.87–5.11)
RDW: 13.5 % (ref 11.5–15.5)
WBC: 13.3 10*3/uL — ABNORMAL HIGH (ref 4.0–10.5)
nRBC: 0 % (ref 0.0–0.2)

## 2020-01-20 ENCOUNTER — Inpatient Hospital Stay: Payer: Medicare Other | Admitting: Oncology

## 2020-01-26 ENCOUNTER — Telehealth (INDEPENDENT_AMBULATORY_CARE_PROVIDER_SITE_OTHER): Payer: Medicare Other | Admitting: Nurse Practitioner

## 2020-01-26 ENCOUNTER — Other Ambulatory Visit: Payer: Self-pay

## 2020-01-26 ENCOUNTER — Ambulatory Visit: Payer: Medicare Other | Admitting: Internal Medicine

## 2020-01-26 DIAGNOSIS — K222 Esophageal obstruction: Secondary | ICD-10-CM | POA: Diagnosis not present

## 2020-01-26 DIAGNOSIS — K219 Gastro-esophageal reflux disease without esophagitis: Secondary | ICD-10-CM

## 2020-01-26 DIAGNOSIS — R1310 Dysphagia, unspecified: Secondary | ICD-10-CM

## 2020-01-26 DIAGNOSIS — R1319 Other dysphagia: Secondary | ICD-10-CM

## 2020-01-26 NOTE — Progress Notes (Signed)
I connected with EMERI Anderson on by telephone on 01/26/2020 and verified that I am speaking with the correct person using two identifiers.   Location:  Patient: home Provider: work    I discussed the limitations, risks, security and privacy concerns of performing an evaluation and management service by telephone and the availability of in person appointments. I also discussed with the patient that there may be a patient responsible charge related to this service. The patient expressed understanding and agreed to proceed.   History of Present Illness:  Shawna Anderson is a 54 year old with a history of problems with her stomach since she was a child. Chart review shows she had an EGD on 07/11/2019 by Dr. Vicente Males with a Schatzki ring that was found but could not be treated because of inability to sedate her.  She says that whenever she eats food, it seems to hit a certain spot in her stomach, rumbles around, and then causes some choking and coughing.  She says that food seems to roll back up and she vomits.  Beef and chicken seem to bother her. But, she can tolerate crab legs. She has had no aspiration pneumonia.  No signs of bronchitis.  She does have Zofran to use and took a dose this morning and it knocked her out until noon.  She missed her appointment with Dr. Olivia Mackie today.  She is feeling pretty good now and holding down food and liquids without problem.  Past Medical History:  Diagnosis Date  . Anxiety   . Asthma   . Bipolar 1 disorder ([place])   . Bipolar disorder ([place])   . CAD (coronary artery disease)    s/p stent BMS OM Cx  . Cervical herniated disc 04/12/2016  . COPD (chronic obstructive pulmonary disease) ([place])   . Depression   . Diabetes mellitus without complication ([address])   . Diverticulitis   . GERD (gastroesophageal reflux disease)   . Glaucoma   . History of blood transfusion   . Hyperlipidemia   . Hypertension   . OSA (obstructive sleep apnea)    not using cpap   . Plantar  fasciitis    b/l feet s/p steroid shots w/o help and left surgery w/o help   . UTI (urinary tract infection)      Social History   Socioeconomic History  . Marital status: Single    Spouse name: Not on file  . Number of children: Not on file  . Years of education: Not on file  . Highest education level: Not on file  Occupational History  . Not on file  Tobacco Use  . Smoking status: Current Every Day Smoker    Packs/day: 0.50    Years: 30.00    Pack years: 15.00    Types: Cigarettes  . Smokeless tobacco: Never Used  . Tobacco comment: refused  Vaping Use  . Vaping Use: Never used  Substance and Sexual Activity  . Alcohol use: Not Currently    Comment: once a year  . Drug use: Yes    Frequency: 7.0 times per week    Types: Marijuana  . Sexual activity: Not on file  Other Topics Concern  . Not on file  Social History Narrative   From [place] now living in [place]    1 son    No guns    Wears seat belt   Safe in relationship    Social Determinants of Health   Financial Resource Strain:   . Difficulty  of Paying Living Expenses:   Food Insecurity:   . Worried About Charity fundraiser in the Last Year:   . Arboriculturist in the Last Year:   Transportation Needs:   . Film/video editor (Medical):   Marland Kitchen Lack of Transportation (Non-Medical):   Physical Activity:   . Days of Exercise per Week:   . Minutes of Exercise per Session:   Stress:   . Feeling of Stress :   Social Connections:   . Frequency of Communication with Friends and Family:   . Frequency of Social Gatherings with Friends and Family:   . Attends Religious Services:   . Active Member of Clubs or Organizations:   . Attends Archivist Meetings:   Marland Kitchen Marital Status:   Intimate Partner Violence:   . Fear of Current or Ex-Partner:   . Emotionally Abused:   Marland Kitchen Physically Abused:   . Sexually Abused:     Past Surgical History:  Procedure Laterality Date  . ABDOMINAL  HYSTERECTOMY    . ABDOMINAL SURGERY  1995   Bowel resection.  Marland Kitchen CARDIAC CATHETERIZATION N/A 04/14/2016   Procedure: Left Heart Cath and Coronary Angiography;  Surgeon: Burnell Blanks, MD;  Location: [facility];  Service: Cardiovascular;  Laterality: N/A;  . CARDIAC SURGERY    . COLONOSCOPY WITH PROPOFOL N/A 07/11/2019   Procedure: COLONOSCOPY WITH PROPOFOL;  Surgeon: Jonathon Bellows, MD;  Location: [facility] ENDOSCOPY;  Service: Gastroenterology;  Laterality: N/A;  . COLONOSCOPY WITH PROPOFOL N/A 07/29/2019   Procedure: COLONOSCOPY WITH PROPOFOL;  Surgeon: Jonathon Bellows, MD;  Location: [facility] ENDOSCOPY;  Service: Gastroenterology;  Laterality: N/A;  . CORONARY ANGIOPLASTY WITH STENT PLACEMENT  2010   Drug eluting stent  . ESOPHAGOGASTRODUODENOSCOPY (EGD) WITH PROPOFOL N/A 07/11/2019   Procedure: ESOPHAGOGASTRODUODENOSCOPY (EGD) WITH PROPOFOL;  Surgeon: Jonathon Bellows, MD;  Location: [facility] ENDOSCOPY;  Service: Gastroenterology;  Laterality: N/A;  . OVARIAN CYST REMOVAL      Family History  Problem Relation Age of Onset  . CAD Mother   . Depression Mother   . Heart disease Mother   . Hyperlipidemia Mother   . Hypertension Mother   . CAD Brother   . Depression Brother   . Diabetes Brother   . Heart disease Brother   . Hyperlipidemia Brother   . Heart disease Father   . Alcohol abuse Father   . Lupus Other   . Sickle cell anemia Other     Allergies  Allergen Reactions  . Ativan [Lorazepam]     Pt states it makes her tongue do weird things   . Latex Other (See Comments)    Patient stated that she was told by her doctor that she is "allergic to" this  . Tape Other (See Comments)    Patient stated that she was told by her doctor that she is "allergic to" this  . Xifaxan [Rifaximin]     Pt believes this is contributing to her abdominal pain/discomfort  . Drixoral Allergy Sinus [Dexbromphen-Pse-Apap Er] Rash    Current Outpatient Medications on File Prior to Visit  Medication Sig  Dispense Refill  . albuterol (PROVENTIL) (2.5 MG/3ML) 0.083% nebulizer solution Take 3 mLs (2.5 mg total) by nebulization every 6 (six) hours as needed for wheezing or shortness of breath. 360 mL 12  . Albuterol Sulfate (PROAIR RESPICLICK) 888 (90 Base) MCG/ACT AEPB Inhale 1 Inhaler into the lungs every 4 (four) hours as needed. 3 each 4  . ALPRAZolam (XANAX) 0.25  MG tablet Take 1 tablet (0.25 mg total) by mouth at bedtime as needed for anxiety or sleep. 30 tablet 2  . amLODipine (NORVASC) 10 MG tablet Take 1 tablet (10 mg total) by mouth at bedtime. 90 tablet 3  . aspirin EC 81 MG tablet Take 1 tablet (81 mg total) by mouth daily. 90 tablet 3  . atorvastatin (LIPITOR) 80 MG tablet Take 1 tablet (80 mg total) by mouth daily at 6 PM. 90 tablet 3  . Cholecalciferol 1.25 MG (50000 UT) capsule Take 1 capsule (50,000 Units total) by mouth once a week. 13 capsule 1  . clopidogrel (PLAVIX) 75 MG tablet Take 1 tablet (75 mg total) by mouth daily. 90 tablet 3  . cyclobenzaprine (FLEXERIL) 5 MG tablet Take 1 tablet (5 mg total) by mouth at bedtime as needed for muscle spasms. 90 tablet 3  . diclofenac Sodium (VOLTAREN) 1 % GEL Apply 2 g topically 4 (four) times daily. Prn OA and chest wall pain 450 g 3  . dicyclomine (BENTYL) 10 MG capsule Take 1 capsule (10 mg total) by mouth 4 (four) times daily for 7 days. 28 capsule 0  . dicyclomine (BENTYL) 10 MG capsule Take 1 capsule (10 mg total) by mouth 4 (four) times daily -  before meals and at bedtime. 360 capsule 0  . divalproex (DEPAKOTE) 500 MG DR tablet Take 1 tablet (500 mg total) by mouth 2 (two) times daily. 180 tablet 1  . ezetimibe (ZETIA) 10 MG tablet Take 1 tablet (10 mg total) by mouth daily. With lipitor 80 mg at night 90 tablet 3  . hyoscyamine (LEVSIN) 0.125 MG tablet Take 1 tablet (0.125 mg total) by mouth every 4 (four) hours. As needed 180 tablet 5  . ipratropium (ATROVENT) 0.06 % nasal spray Place 2 sprays into both nostrils 3 (three) times  daily. 45 mL 3  . ipratropium-albuterol (DUONEB) 0.5-2.5 (3) MG/3ML SOLN Take 3 mLs by nebulization every 6 (six) hours as needed. (Patient not taking: Reported on 01/11/2020) 360 mL 11  . isosorbide mononitrate (IMDUR) 30 MG 24 hr tablet Take 1 tablet (30 mg total) by mouth daily. 90 tablet 0  . levocetirizine (XYZAL) 5 MG tablet Take 1 tablet (5 mg total) by mouth at bedtime as needed for allergies. 90 tablet 3  . lidocaine (LIDODERM) 5 % Place 1 patch onto the skin daily. Remove & Discard patch within 12 hours or as directed by MD 30 patch 0  . nitroGLYCERIN (NITROSTAT) 0.4 MG SL tablet Place 1 tablet (0.4 mg total) under the tongue every 5 (five) minutes as needed for chest pain. X 2 call 911 after 3rd dose 90 tablet 3  . olmesartan-hydrochlorothiazide (BENICAR HCT) 40-25 MG tablet Take 1 tablet by mouth daily. 90 tablet 3  . ondansetron (ZOFRAN ODT) 4 MG disintegrating tablet Take 1 tablet (4 mg total) by mouth every 8 (eight) hours as needed for nausea or vomiting. 270 tablet 3  . pantoprazole (PROTONIX) 40 MG tablet TAKE 1 TABLET BY MOUTH  TWICE DAILY BEFORE MEALS 180 tablet 0  . promethazine (PHENERGAN) 25 MG suppository Place 1 suppository (25 mg total) rectally every 6 (six) hours as needed for nausea. (Patient not taking: Reported on 01/11/2020) 12 suppository 1  . senna-docusate (SENOKOT-S) 8.6-50 MG tablet Take 1-2 tablets by mouth daily as needed for mild constipation. 90 tablet 3  . sodium chloride (OCEAN) 0.65 % SOLN nasal spray Place 2 sprays into both nostrils as needed for congestion. 90 mL  3  . traZODone (DESYREL) 150 MG tablet Take 1 tablet (150 mg total) by mouth at bedtime as needed for sleep. 90 tablet 3   No current facility-administered medications on file prior to visit.    There were no vitals taken for this visit.    Observations/Objective:   VITALS per patient if applicable:   GENERAL: She sounds alert, oriented.    LUNGS: No sounds of respiratory distress,  breathing rate appears normal, or cough. No breathiness with speaking, or wheezing.      PSYCH/NEURO: pleasant and cooperative, no obvious depression or anxiety, speech and thought processing grossly intact   Assessment and Plan:   Esophageal dysphagia likely secondary to  Schatzki ring of the esophagus GERD PLAN: Patient advised to make appoint with Dr.Anna once again to revisit this problem.  She was given his phone number since she is an active patient.  This will give him a call.  She will remain on Protonix 40 mg twice daily.  She has Zofran to use as needed.  Advised to take small bites, chew well, eat slowly, room temperature liquids, remain upright after eating.  Do not lay down for 3 hours after eating.  I discussed the assessment and treatment plan with the patient. The patient was provided an opportunity to ask questions and all were answered. The patient agreed with the plan and demonstrated an understanding of the instructions.     The patient was advised to call back or seek an in-person evaluation if the symptoms worsen or if the condition fails to improve as anticipated.   I provided 9 minutes of non-face-to-face time during this encounter.    Denice Paradise, NP Adult Nurse Practitioner [place] [phone]

## 2020-01-29 ENCOUNTER — Encounter: Payer: Self-pay | Admitting: Nurse Practitioner

## 2020-01-29 NOTE — Patient Instructions (Addendum)
PLAN: Patient advised to make appoint with Dr. [phone] once again to revisit this problem.   Please take the pantoprazole- Protonix 40 mg twice daily.  Use Zofran to use as needed.   Please chew well, take small bites,  eat slowly, room temperature liquids, remain upright after eating.  Do not lay down for 3 hours after eating.   Dysphagia  Dysphagia is trouble swallowing. This condition occurs when solids and liquids stick in a person's throat on the way down to the stomach, or when food takes longer to get to the stomach than usual. You may have problems swallowing food, liquids, or both. You may also have pain while trying to swallow. It may take you more time and effort to swallow something. What are the causes? This condition may be caused by:  Muscle problems. They may make it difficult for you to move food and liquids through the esophagus, which is the tube that connects your mouth to your stomach.  Blockages. You may have ulcers, scar tissue, or inflammation that blocks the normal passage of food and liquids. Causes of these problems include: ? Acid reflux from your stomach into your esophagus (gastroesophageal reflux). ? Infections. ? Radiation treatment for cancer. ? Medicines taken without enough fluids to wash them down into your stomach.  Stroke. This can affect the nerves and make it difficult to swallow.  Nerve problems. These prevent signals from being sent to the muscles of your esophagus to squeeze (contract) and move what you swallow down to your stomach.  Globus pharyngeus. This is a common problem that involves a feeling like something is stuck in your throat or a sense of trouble with swallowing, even though nothing is wrong with the swallowing passages.  Certain conditions, such as cerebral palsy or Parkinson's disease. What are the signs or symptoms? Common symptoms of this condition include:  A feeling that solids or liquids are stuck in your  throat on the way down to the stomach.  Pain while swallowing.  Coughing or gagging while trying to swallow. Other symptoms include:  Food moving back from your stomach to your mouth (regurgitation).  Noises coming from your throat.  Chest discomfort with swallowing.  A feeling of fullness when swallowing.  Drooling, especially when the throat is blocked.  Heartburn. How is this diagnosed? This condition may be diagnosed by:  Barium X-ray. In this test, you will swallow a white liquid that sticks to the inside of your esophagus. X-ray images are then taken.  Endoscopy. In this test, a flexible telescope is inserted down your throat to look at your esophagus and your stomach.  CT scans and an MRI. How is this treated? Treatment for dysphagia depends on the cause of this condition, such as:  If the dysphagia is caused by acid reflux or infection, medicines may be used. They may include antibiotics and heartburn medicines.  If the dysphagia is caused by problems with the muscles, swallowing therapy may be used to help you strengthen your swallowing muscles. You may have to do specific exercises to strengthen the muscles or stretch them.  If the dysphagia is caused by a blockage or mass, procedures to remove the blockage may be done. You may need surgery and a feeding tube. You may need to make diet changes. Ask your health care provider for specific instructions. Follow these instructions at home: Medicines  Take over-the-counter and prescription medicines only as told by your health care provider.  If you were prescribed an antibiotic  medicine, take it as told by your health care provider. Do not stop taking the antibiotic even if you start to feel better. Eating and drinking   Follow any diet changes as told by your health care provider.  Work with a diet and nutrition specialist (dietitian) to create an eating plan that will help you get the nutrients you need in order  to stay healthy.  Eat soft foods that are easier to swallow.  Cut your food into small pieces and eat slowly. Take small bites.  Eat and drink only when you are sitting upright.  Do not drink alcohol or caffeine. If you need help quitting, ask your health care provider. General instructions  Check your weight every day to make sure you are not losing weight.  Do not use any products that contain nicotine or tobacco, such as cigarettes, e-cigarettes, and chewing tobacco. If you need help quitting, ask your health care provider.  Keep all follow-up visits as told by your health care provider. This is important. Contact a health care provider if you:  Lose weight because you cannot swallow.  Cough when you drink liquids.  Cough up partially digested food. Get help right away if you:  Cannot swallow your saliva.  Have shortness of breath, a fever, or both.  Have a hoarse voice and also have trouble swallowing. Summary  Dysphagia is trouble swallowing. This condition occurs when solids and liquids stick in a person's throat on the way down to the stomach. You may cough or gag while trying to swallow.  Dysphagia has many possible causes.  Treatment for dysphagia depends on the cause of the condition.  Keep all follow-up visits as told by your health care provider. This is important. This information is not intended to replace advice given to you by your health care provider. Make sure you discuss any questions you have with your health care provider. Document Revised: 11/24/2018 Document Reviewed: 11/24/2018 Elsevier Patient Education  2020 [place] for Gastroesophageal Reflux Disease, Adult When you have gastroesophageal reflux disease (GERD), the foods you eat and your eating habits are very important. Choosing the right foods can help ease the discomfort of GERD. Consider working with a diet and nutrition specialist (dietitian) to help you make healthy  food choices. What general guidelines should I follow?  Eating plan  Choose healthy foods low in fat, such as fruits, vegetables, whole grains, low-fat dairy products, and lean meat, fish, and poultry.  Eat frequent, small meals instead of three large meals each day. Eat your meals slowly, in a relaxed setting. Avoid bending over or lying down until 2-3 hours after eating.  Limit high-fat foods such as fatty meats or fried foods.  Limit your intake of oils, butter, and shortening to less than 8 teaspoons each day.  Avoid the following: ? Foods that cause symptoms. These may be different for different people. Keep a food diary to keep track of foods that cause symptoms. ? Alcohol. ? Drinking large amounts of liquid with meals. ? Eating meals during the 2-3 hours before bed.  Cook foods using methods other than frying. This may include baking, grilling, or broiling. Lifestyle  Maintain a healthy weight. Ask your health care provider what weight is healthy for you. If you need to lose weight, work with your health care provider to do so safely.  Exercise for at least 30 minutes on 5 or more days each week, or as told by your health care provider.  Avoid wearing clothes that fit tightly around your waist and chest.  Do not use any products that contain nicotine or tobacco, such as cigarettes and e-cigarettes. If you need help quitting, ask your health care provider.  Sleep with the head of your bed raised. Use a wedge under the mattress or blocks under the bed frame to raise the head of the bed. What foods are not recommended? The items listed may not be a complete list. Talk with your dietitian about what dietary choices are best for you. Grains Pastries or quick breads with added fat. [place] toast. Vegetables Deep fried vegetables. [place] fries. Any vegetables prepared with added fat. Any vegetables that cause symptoms. For some people this may include tomatoes and tomato products,  chili peppers, onions and garlic, and horseradish. Fruits Any fruits prepared with added fat. Any fruits that cause symptoms. For some people this may include citrus fruits, such as oranges, grapefruit, pineapple, and lemons. Meats and other protein foods High-fat meats, such as fatty beef or pork, hot dogs, ribs, ham, sausage, salami and bacon. Fried meat or protein, including fried fish and fried chicken. Nuts and nut butters. Dairy Whole milk and chocolate milk. Sour cream. Cream. Ice cream. Cream cheese. Milk shakes. Beverages Coffee and tea, with or without caffeine. Carbonated beverages. Sodas. Energy drinks. Fruit juice made with acidic fruits (such as orange or grapefruit). Tomato juice. Alcoholic drinks. Fats and oils Butter. Margarine. Shortening. Ghee. Sweets and desserts Chocolate and cocoa. Donuts. Seasoning and other foods Pepper. Peppermint and spearmint. Any condiments, herbs, or seasonings that cause symptoms. For some people, this may include curry, hot sauce, or vinegar-based salad dressings. Summary  When you have gastroesophageal reflux disease (GERD), food and lifestyle choices are very important to help ease the discomfort of GERD.  Eat frequent, small meals instead of three large meals each day. Eat your meals slowly, in a relaxed setting. Avoid bending over or lying down until 2-3 hours after eating.  Limit high-fat foods such as fatty meat or fried foods. This information is not intended to replace advice given to you by your health care provider. Make sure you discuss any questions you have with your health care provider. Document Revised: 10/21/2018 Document Reviewed: 07/01/2016 Elsevier Patient Education  [place].

## 2020-01-30 ENCOUNTER — Inpatient Hospital Stay (HOSPITAL_BASED_OUTPATIENT_CLINIC_OR_DEPARTMENT_OTHER): Payer: Medicare Other | Admitting: Oncology

## 2020-01-30 ENCOUNTER — Encounter: Payer: Self-pay | Admitting: Gastroenterology

## 2020-01-30 ENCOUNTER — Encounter: Payer: Self-pay | Admitting: Oncology

## 2020-01-30 ENCOUNTER — Other Ambulatory Visit: Payer: Self-pay

## 2020-01-30 ENCOUNTER — Ambulatory Visit (INDEPENDENT_AMBULATORY_CARE_PROVIDER_SITE_OTHER): Payer: Medicare Other | Admitting: Gastroenterology

## 2020-01-30 DIAGNOSIS — D75839 Thrombocytosis, unspecified: Secondary | ICD-10-CM

## 2020-01-30 DIAGNOSIS — R233 Spontaneous ecchymoses: Secondary | ICD-10-CM

## 2020-01-30 DIAGNOSIS — D473 Essential (hemorrhagic) thrombocythemia: Secondary | ICD-10-CM

## 2020-01-30 DIAGNOSIS — D729 Disorder of white blood cells, unspecified: Secondary | ICD-10-CM | POA: Diagnosis not present

## 2020-01-30 DIAGNOSIS — R1310 Dysphagia, unspecified: Secondary | ICD-10-CM | POA: Diagnosis not present

## 2020-01-30 DIAGNOSIS — K219 Gastro-esophageal reflux disease without esophagitis: Secondary | ICD-10-CM | POA: Diagnosis not present

## 2020-01-30 DIAGNOSIS — R238 Other skin changes: Secondary | ICD-10-CM

## 2020-01-30 NOTE — Progress Notes (Signed)
I connected with [facility] on 01/30/20 at 11:15 AM EDT by video enabled telemedicine visit and verified that I am speaking with the correct person using two identifiers.   I discussed the limitations, risks, security and privacy concerns of performing an evaluation and management service by telemedicine and the availability of in-person appointments. I also discussed with the patient that there may be a patient responsible charge related to this service. The patient expressed understanding and agreed to proceed.  Other persons participating in the visit and their role in the encounter:  none  Patient's location:  home Provider's location:  work  Risk analyst Complaint: Routine follow-up of leukocytosis and easy bruising  History of present illness: patient is a 54 year old female with a past medical history significant for a s/p stent placement, COPD, GERD referred to me for leukocytosis/thrombocytosis.Looking back at her CBCs patient has had chronic leukocytosis at least dating back to 2016 when her white cell count fluctuates widely between 11-20. Differential has mostly show neutrophilia but at times has shown lymphocytosis and monocytosis. Hemoglobin is always been normal but most recently patient was noted to have thrombocytosis as well. Her most recent CBC from 07/18/2019 showed white cell count of 17, H&H of 13.7/40.8 and a platelet count of 550. Patient admits to smoking daily about half a pack per day. Also admits to smoking marijuana occasionally.  Further work-up in March 2021 including flow cytometry did not reveal any immunophenotypic abnormality.  JAK2, CAL R, MPL testing was negative.  BCR ABL FISH testing was negative.  HIV hepatitis B hepatitis C testing negative.  With regards to her easy bruising she underwent PT PTT INR, fibrinogen levels which were normal.  Von Willebrand panel was normal except for mildly elevated factor VIII level of 152%.  Thrombin time was normal.  PFA was  not done as patient is on Plavix.   Interval history patient reports a couple of weeks ago she had a big bruise on her back which resolved spontaneously.  She does not have any present bruises to show.  Denies any bleeding in her stool or urine.  Denies any gum bleeds or nosebleeds.   Review of Systems  Constitutional: Positive for malaise/fatigue. Negative for chills, fever and weight loss.  HENT: Negative for congestion, ear discharge and nosebleeds.   Eyes: Negative for blurred vision.  Respiratory: Negative for cough, hemoptysis, sputum production, shortness of breath and wheezing.   Cardiovascular: Negative for chest pain, palpitations, orthopnea and claudication.  Gastrointestinal: Negative for abdominal pain, blood in stool, constipation, diarrhea, heartburn, melena, nausea and vomiting.  Genitourinary: Negative for dysuria, flank pain, frequency, hematuria and urgency.  Musculoskeletal: Negative for back pain, joint pain and myalgias.  Skin: Negative for rash.  Neurological: Negative for dizziness, tingling, focal weakness, seizures, weakness and headaches.  Endo/Heme/Allergies: Bruises/bleeds easily.  Psychiatric/Behavioral: Negative for depression and suicidal ideas. The patient does not have insomnia.     Allergies  Allergen Reactions  . Ativan [Lorazepam]     Pt states it makes her tongue do weird things   . Latex Other (See Comments)    Patient stated that she was told by her doctor that she is "allergic to" this  . Tape Other (See Comments)    Patient stated that she was told by her doctor that she is "allergic to" this  . Xifaxan [Rifaximin]     Pt believes this is contributing to her abdominal pain/discomfort  . Drixoral Allergy Sinus [Dexbromphen-Pse-Apap Er] Rash    Past  Medical History:  Diagnosis Date  . Anxiety   . Asthma   . Bipolar 1 disorder ([place])   . Bipolar disorder ([place])   . CAD (coronary artery disease)    s/p stent BMS OM Cx  . Cervical herniated  disc 04/12/2016  . COPD (chronic obstructive pulmonary disease) ([place])   . Depression   . Diabetes mellitus without complication ([place])   . Diverticulitis   . GERD (gastroesophageal reflux disease)   . Glaucoma   . History of blood transfusion   . Hyperlipidemia   . Hypertension   . OSA (obstructive sleep apnea)    not using cpap   . Plantar fasciitis    b/l feet s/p steroid shots w/o help and left surgery w/o help   . UTI (urinary tract infection)     Past Surgical History:  Procedure Laterality Date  . ABDOMINAL HYSTERECTOMY    . ABDOMINAL SURGERY  1995   Bowel resection.  Marland Kitchen CARDIAC CATHETERIZATION N/A 04/14/2016   Procedure: Left Heart Cath and Coronary Angiography;  Surgeon: Burnell Blanks, MD;  Location: [place] [facility];  Service: Cardiovascular;  Laterality: N/A;  . CARDIAC SURGERY    . COLONOSCOPY WITH PROPOFOL N/A 07/11/2019   Procedure: COLONOSCOPY WITH PROPOFOL;  Surgeon: Jonathon Bellows, MD;  Location: [facility] ENDOSCOPY;  Service: Gastroenterology;  Laterality: N/A;  . COLONOSCOPY WITH PROPOFOL N/A 07/29/2019   Procedure: COLONOSCOPY WITH PROPOFOL;  Surgeon: Jonathon Bellows, MD;  Location: [facility] ENDOSCOPY;  Service: Gastroenterology;  Laterality: N/A;  . CORONARY ANGIOPLASTY WITH STENT PLACEMENT  2010   Drug eluting stent  . ESOPHAGOGASTRODUODENOSCOPY (EGD) WITH PROPOFOL N/A 07/11/2019   Procedure: ESOPHAGOGASTRODUODENOSCOPY (EGD) WITH PROPOFOL;  Surgeon: Jonathon Bellows, MD;  Location: [facility] ENDOSCOPY;  Service: Gastroenterology;  Laterality: N/A;  . OVARIAN CYST REMOVAL      Social History   Socioeconomic History  . Marital status: Single    Spouse name: Not on file  . Number of children: Not on file  . Years of education: Not on file  . Highest education level: Not on file  Occupational History  . Not on file  Tobacco Use  . Smoking status: Current Every Day Smoker    Packs/day: 0.50    Years: 30.00    Pack years: 15.00    Types: Cigarettes  . Smokeless  tobacco: Never Used  . Tobacco comment: refused  Vaping Use  . Vaping Use: Never used  Substance and Sexual Activity  . Alcohol use: Not Currently    Comment: once a year  . Drug use: Yes    Frequency: 7.0 times per week    Types: Marijuana  . Sexual activity: Not on file  Other Topics Concern  . Not on file  Social History Narrative   From [place] now living in [place]    1 son    No guns    Wears seat belt   Safe in relationship    Social Determinants of Health   Financial Resource Strain:   . Difficulty of Paying Living Expenses:   Food Insecurity:   . Worried About Charity fundraiser in the Last Year:   . Arboriculturist in the Last Year:   Transportation Needs:   . Film/video editor (Medical):   Marland Kitchen Lack of Transportation (Non-Medical):   Physical Activity:   . Days of Exercise per Week:   . Minutes of Exercise per Session:   Stress:   . Feeling of Stress :  Social Connections:   . Frequency of Communication with Friends and Family:   . Frequency of Social Gatherings with Friends and Family:   . Attends Religious Services:   . Active Member of Clubs or Organizations:   . Attends Archivist Meetings:   Marland Kitchen Marital Status:   Intimate Partner Violence:   . Fear of Current or Ex-Partner:   . Emotionally Abused:   Marland Kitchen Physically Abused:   . Sexually Abused:     Family History  Problem Relation Age of Onset  . CAD Mother   . Depression Mother   . Heart disease Mother   . Hyperlipidemia Mother   . Hypertension Mother   . CAD Brother   . Depression Brother   . Diabetes Brother   . Heart disease Brother   . Hyperlipidemia Brother   . Heart disease Father   . Alcohol abuse Father   . Lupus Other   . Sickle cell anemia Other      Current Outpatient Medications:  .  albuterol (PROVENTIL) (2.5 MG/3ML) 0.083% nebulizer solution, Take 3 mLs (2.5 mg total) by nebulization every 6 (six) hours as needed for wheezing or shortness of  breath., Disp: 360 mL, Rfl: 12 .  albuterol (VENTOLIN HFA) 108 (90 Base) MCG/ACT inhaler, Inhale into the lungs., Disp: , Rfl:  .  Albuterol Sulfate (PROAIR RESPICLICK) 295 (90 Base) MCG/ACT AEPB, Inhale 1 Inhaler into the lungs every 4 (four) hours as needed., Disp: 3 each, Rfl: 4 .  ALPRAZolam (XANAX) 0.25 MG tablet, Take 1 tablet (0.25 mg total) by mouth at bedtime as needed for anxiety or sleep., Disp: 30 tablet, Rfl: 2 .  amLODipine (NORVASC) 10 MG tablet, Take 1 tablet (10 mg total) by mouth at bedtime., Disp: 90 tablet, Rfl: 3 .  aspirin EC 81 MG tablet, Take 1 tablet (81 mg total) by mouth daily., Disp: 90 tablet, Rfl: 3 .  Cholecalciferol 1.25 MG (50000 UT) capsule, Take 1 capsule (50,000 Units total) by mouth once a week., Disp: 13 capsule, Rfl: 1 .  clopidogrel (PLAVIX) 75 MG tablet, Take 1 tablet (75 mg total) by mouth daily., Disp: 90 tablet, Rfl: 3 .  cyclobenzaprine (FLEXERIL) 5 MG tablet, Take 1 tablet (5 mg total) by mouth at bedtime as needed for muscle spasms., Disp: 90 tablet, Rfl: 3 .  diclofenac Sodium (VOLTAREN) 1 % GEL, Apply 2 g topically 4 (four) times daily. Prn OA and chest wall pain, Disp: 450 g, Rfl: 3 .  dicyclomine (BENTYL) 10 MG capsule, Take 1 capsule (10 mg total) by mouth 4 (four) times daily -  before meals and at bedtime., Disp: 360 capsule, Rfl: 0 .  divalproex (DEPAKOTE) 500 MG DR tablet, Take 1 tablet (500 mg total) by mouth 2 (two) times daily., Disp: 180 tablet, Rfl: 1 .  ezetimibe (ZETIA) 10 MG tablet, Take 1 tablet (10 mg total) by mouth daily. With lipitor 80 mg at night, Disp: 90 tablet, Rfl: 3 .  ipratropium (ATROVENT) 0.06 % nasal spray, Place 2 sprays into both nostrils 3 (three) times daily., Disp: 45 mL, Rfl: 3 .  ipratropium-albuterol (DUONEB) 0.5-2.5 (3) MG/3ML SOLN, Take 3 mLs by nebulization every 6 (six) hours as needed., Disp: 360 mL, Rfl: 11 .  isosorbide mononitrate (IMDUR) 30 MG 24 hr tablet, Take 1 tablet (30 mg total) by mouth daily.,  Disp: 90 tablet, Rfl: 0 .  levocetirizine (XYZAL) 5 MG tablet, Take 1 tablet (5 mg total) by mouth at bedtime as needed  for allergies., Disp: 90 tablet, Rfl: 3 .  lidocaine (LIDODERM) 5 %, Place 1 patch onto the skin daily. Remove & Discard patch within 12 hours or as directed by MD, Disp: 30 patch, Rfl: 0 .  nitroGLYCERIN (NITROSTAT) 0.4 MG SL tablet, Place 1 tablet (0.4 mg total) under the tongue every 5 (five) minutes as needed for chest pain. X 2 call 911 after 3rd dose, Disp: 90 tablet, Rfl: 3 .  olmesartan-hydrochlorothiazide (BENICAR HCT) 40-25 MG tablet, Take 1 tablet by mouth daily., Disp: 90 tablet, Rfl: 3 .  ondansetron (ZOFRAN ODT) 4 MG disintegrating tablet, Take 1 tablet (4 mg total) by mouth every 8 (eight) hours as needed for nausea or vomiting., Disp: 270 tablet, Rfl: 3 .  pantoprazole (PROTONIX) 40 MG tablet, TAKE 1 TABLET BY MOUTH  TWICE DAILY BEFORE MEALS, Disp: 180 tablet, Rfl: 0 .  promethazine (PHENERGAN) 25 MG suppository, Place 1 suppository (25 mg total) rectally every 6 (six) hours as needed for nausea., Disp: 12 suppository, Rfl: 1 .  senna-docusate (SENOKOT-S) 8.6-50 MG tablet, Take 1-2 tablets by mouth daily as needed for mild constipation., Disp: 90 tablet, Rfl: 3 .  sodium chloride (OCEAN) 0.65 % SOLN nasal spray, Place 2 sprays into both nostrils as needed for congestion., Disp: 90 mL, Rfl: 3 .  traZODone (DESYREL) 150 MG tablet, Take 1 tablet (150 mg total) by mouth at bedtime as needed for sleep., Disp: 90 tablet, Rfl: 3 .  atorvastatin (LIPITOR) 80 MG tablet, Take 1 tablet (80 mg total) by mouth daily at 6 PM., Disp: 90 tablet, Rfl: 3 .  dicyclomine (BENTYL) 10 MG capsule, Take 1 capsule (10 mg total) by mouth 4 (four) times daily for 7 days., Disp: 28 capsule, Rfl: 0 .  hyoscyamine (LEVSIN) 0.125 MG tablet, Take 1 tablet (0.125 mg total) by mouth every 4 (four) hours. As needed, Disp: 180 tablet, Rfl: 5  No results found.  No images are attached to the  encounter.   CMP Latest Ref Rng & Units 11/15/2019  Glucose 70 - 99 mg/dL 120(H)  BUN 6 - 23 mg/dL 19  Creatinine 0.40 - 1.20 mg/dL 0.92  Sodium 135 - 145 mEq/L 141  Potassium 3.5 - 5.1 mEq/L 3.6  Chloride 96 - 112 mEq/L 102  CO2 19 - 32 mEq/L 29  Calcium 8.4 - 10.5 mg/dL 9.9  Total Protein 6.0 - 8.3 g/dL 6.8  Total Bilirubin 0.2 - 1.2 mg/dL 0.4  Alkaline Phos 39 - 117 U/L 89  AST 0 - 37 U/L 27  ALT 0 - 35 U/L 41(H)   CBC Latest Ref Rng & Units 01/13/2020  WBC 4.0 - 10.5 K/uL 13.3(H)  Hemoglobin 12.0 - 15.0 g/dL 13.6  Hematocrit 36 - 46 % 40.4  Platelets 150 - 400 K/uL 444(H)     Observation/objective: Appears in no acute distress over video visit today.  Breathing is nonlabored  Assessment and plan: Patient is a 54 year old female with following issues  1.  Leukocytosis/thrombocytosis: Suspect this is reactive as prior work-up including flow cytometry, BCR ABL testing and JAK2 mutation testing has been negative.  Continue to monitor  2.  Patient noted to have anemia of 10.6 during her visit in April which is now normalized to 13.6.  3.  Easy bruising: Etiology unclear.  PT will be INR, fibrinogen, thrombin time and von Willebrand panel was unremarkable.  I have again offered the patient a second opinion at [facility] if her bruising episodes continue.  Patient would like  to hold off for now but will let me know if her problem worsens  Follow-up instructions: CBC with differential in 3 in 6 months and I will see her back in 6 months  I discussed the assessment and treatment plan with the patient. The patient was provided an opportunity to ask questions and all were answered. The patient agreed with the plan and demonstrated an understanding of the instructions.   The patient was advised to call back or seek an in-person evaluation if the symptoms worsen or if the condition fails to improve as anticipated.   Visit Diagnosis: 1. Neutrophilia   2. Thrombocytosis (HCC)   3. Easy  bruising     Dr. Randa Evens, MD, MPH [facility] Tel- [phone] 01/30/2020 11:33 AM

## 2020-01-30 NOTE — Progress Notes (Signed)
Patient called/ pre- screened for virtual appoinment today with oncologist. Shawna Anderson bruising, otherwise no concerns or complaints at this time.

## 2020-01-30 NOTE — Progress Notes (Signed)
Jonathon Bellows MD, MRCP(U.K) [address]  [place]  [place], [place] [address]  Main: [phone]  Fax: [phone]   Primary Care Physician: McLean-Scocuzza, Nino Glow, MD  Primary Gastroenterologist:  Dr. Jonathon Bellows   Vomiting, dysphagia follow-up   HPI: Shawna Anderson is a 54 y.o. female    Summary of history :  Initially seen and referred in November 2020 for GERD  >10 years.  At initial visit she was on Protonix once a day which was not helping her. Symptoms of heartburn regurgitation.She says that she has had rupture of her intestines at the age of 10 possibly due to diverticulitis. Initial visit she had pain in the left lower quadrant which has been ongoing since visit to ER in November 2020. At that time she was given a course of antibiotics as there was  inflammation seen on the CT scan at her distal colonic anastomosis. History of partial colectomy. Hysterectomy showing an indeterminant left adnexal soft tissue focus adjacent to perianastomotic thick-walled blind ending distal colonic limb. Left ovarian mass cannot be excluded entirely.    05/20/2019: Ultrasound pelvis: Collection with a shadowing calcification seen in the left adnexa adjacent to the pelvic sidewall correspondents well to a collection seen on previous CT. Possible postsurgical or lesion of ovarian adnexal origin. Recommended to follow-up CT or MRI in 1 to 2 months.  07/11/2019: EGD: Nonobstructive Schatzki's ring seen inflammation seen at the antrum of the stomach, biopsies showed no significant abnormalities. Colonoscopy: Patient was unable to be sedated and hence procedure could not be performed she has had issues with sedation in the past.  07/18/2019: Came into the emergency room with abdominal pain. CT scan of the abdomen was repeated showed no significant abnormality. Hemoglobin 13.7 g. White cell count of 17. CMP normal except an elevated glucose of 150. Lipase  normal. 07/29/2019: Colonoscopy: 5 mm polyp in the distal ascending colon resected otherwise normal colonoscopy pathology of the polyp suggests mucosal tearing ectasia and is benign. ED visit on 09/21/2019 for severe retching, dry heaving and left-sided abdominal pain.  Treated with IV fluids Maalox and discharge.  09/21/2019 CT scan of the abdomen and pelvis with contrast showed no acute abnormality.  09/21/2019: AST, ALT were elevated at 122 and 146.  Creatinine was higher than baseline at 1.07.  Potassium was 3.2.  Interval history3/24/2021-01/30/2020 11/15/2019: Transaminases almost normalized with AST of 27 and ALT of 41.  Seen by Denice Paradise nurse practitioner on 01/26/2020.  Some issues of dysphagia.  Today she is back to see me for issues that she has had in the past.  Namely food getting stuck in her throat.  Throwing up when the food hits her stomach.  Continues to smoke marijuana and cigarettes.  Current Outpatient Medications  Medication Sig Dispense Refill  . albuterol (PROVENTIL) (2.5 MG/3ML) 0.083% nebulizer solution Take 3 mLs (2.5 mg total) by nebulization every 6 (six) hours as needed for wheezing or shortness of breath. 360 mL 12  . albuterol (VENTOLIN HFA) 108 (90 Base) MCG/ACT inhaler Inhale into the lungs.    . Albuterol Sulfate (PROAIR RESPICLICK) 202 (90 Base) MCG/ACT AEPB Inhale 1 Inhaler into the lungs every 4 (four) hours as needed. 3 each 4  . ALPRAZolam (XANAX) 0.25 MG tablet Take 1 tablet (0.25 mg total) by mouth at bedtime as needed for anxiety or sleep. 30 tablet 2  . amLODipine (NORVASC) 10 MG tablet Take 1 tablet (10 mg total) by mouth at bedtime. 90 tablet  3  . aspirin EC 81 MG tablet Take 1 tablet (81 mg total) by mouth daily. 90 tablet 3  . atorvastatin (LIPITOR) 80 MG tablet Take 1 tablet (80 mg total) by mouth daily at 6 PM. 90 tablet 3  . Cholecalciferol 1.25 MG (50000 UT) capsule Take 1 capsule (50,000 Units total) by mouth once a week. 13 capsule 1   . clopidogrel (PLAVIX) 75 MG tablet Take 1 tablet (75 mg total) by mouth daily. 90 tablet 3  . cyclobenzaprine (FLEXERIL) 5 MG tablet Take 1 tablet (5 mg total) by mouth at bedtime as needed for muscle spasms. 90 tablet 3  . diclofenac Sodium (VOLTAREN) 1 % GEL Apply 2 g topically 4 (four) times daily. Prn OA and chest wall pain 450 g 3  . dicyclomine (BENTYL) 10 MG capsule Take 1 capsule (10 mg total) by mouth 4 (four) times daily for 7 days. 28 capsule 0  . dicyclomine (BENTYL) 10 MG capsule Take 1 capsule (10 mg total) by mouth 4 (four) times daily -  before meals and at bedtime. 360 capsule 0  . divalproex (DEPAKOTE) 500 MG DR tablet Take 1 tablet (500 mg total) by mouth 2 (two) times daily. 180 tablet 1  . ezetimibe (ZETIA) 10 MG tablet Take 1 tablet (10 mg total) by mouth daily. With lipitor 80 mg at night 90 tablet 3  . hyoscyamine (LEVSIN) 0.125 MG tablet Take 1 tablet (0.125 mg total) by mouth every 4 (four) hours. As needed 180 tablet 5  . ipratropium (ATROVENT) 0.06 % nasal spray Place 2 sprays into both nostrils 3 (three) times daily. 45 mL 3  . ipratropium-albuterol (DUONEB) 0.5-2.5 (3) MG/3ML SOLN Take 3 mLs by nebulization every 6 (six) hours as needed. 360 mL 11  . isosorbide mononitrate (IMDUR) 30 MG 24 hr tablet Take 1 tablet (30 mg total) by mouth daily. 90 tablet 0  . levocetirizine (XYZAL) 5 MG tablet Take 1 tablet (5 mg total) by mouth at bedtime as needed for allergies. 90 tablet 3  . lidocaine (LIDODERM) 5 % Place 1 patch onto the skin daily. Remove & Discard patch within 12 hours or as directed by MD 30 patch 0  . nitroGLYCERIN (NITROSTAT) 0.4 MG SL tablet Place 1 tablet (0.4 mg total) under the tongue every 5 (five) minutes as needed for chest pain. X 2 call 911 after 3rd dose 90 tablet 3  . olmesartan-hydrochlorothiazide (BENICAR HCT) 40-25 MG tablet Take 1 tablet by mouth daily. 90 tablet 3  . ondansetron (ZOFRAN ODT) 4 MG disintegrating tablet Take 1 tablet (4 mg total)  by mouth every 8 (eight) hours as needed for nausea or vomiting. 270 tablet 3  . pantoprazole (PROTONIX) 40 MG tablet TAKE 1 TABLET BY MOUTH  TWICE DAILY BEFORE MEALS 180 tablet 0  . promethazine (PHENERGAN) 25 MG suppository Place 1 suppository (25 mg total) rectally every 6 (six) hours as needed for nausea. 12 suppository 1  . senna-docusate (SENOKOT-S) 8.6-50 MG tablet Take 1-2 tablets by mouth daily as needed for mild constipation. 90 tablet 3  . sodium chloride (OCEAN) 0.65 % SOLN nasal spray Place 2 sprays into both nostrils as needed for congestion. 90 mL 3  . traZODone (DESYREL) 150 MG tablet Take 1 tablet (150 mg total) by mouth at bedtime as needed for sleep. 90 tablet 3   No current facility-administered medications for this visit.    Allergies as of 01/30/2020 - Review Complete 01/30/2020  Allergen Reaction Noted  .  Ativan [lorazepam]  11/14/2017  . Latex Other (See Comments) 02/12/2017  . Tape Other (See Comments) 02/12/2017  . Xifaxan [rifaximin]  09/21/2019  . Drixoral allergy sinus [dexbromphen-pse-apap er] Rash 01/12/2015    ROS:  General: Negative for anorexia, weight loss, fever, chills, fatigue, weakness. ENT: Negative for hoarseness, difficulty swallowing , nasal congestion. CV: Negative for chest pain, angina, palpitations, dyspnea on exertion, peripheral edema.  Respiratory: Negative for dyspnea at rest, dyspnea on exertion, cough, sputum, wheezing.  GI: See history of present illness. GU:  Negative for dysuria, hematuria, urinary incontinence, urinary frequency, nocturnal urination.  Endo: Negative for unusual weight change.    Physical Examination:   There were no vitals taken for this visit.  General: Well-nourished, well-developed in no acute distress.  Eyes: No icterus. Conjunctivae pink. Mouth: Oropharyngeal mucosa moist and pink , no lesions erythema or exudate. Lungs: Clear to auscultation bilaterally. Non-labored. Heart: Regular rate and rhythm, no  murmurs rubs or gallops.  Abdomen: Bowel sounds are normal, nontender, nondistended, no hepatosplenomegaly or masses, no abdominal bruits or hernia , no rebound or guarding.      Neuro: Alert and oriented x 3.  Grossly intact. Skin: Warm and dry, no jaundice.   Psych: Alert and cooperative, normal mood and affect.   Imaging Studies: No results found.  Assessment and Plan:   Shawna Anderson Shawna Anderson is a 54 y.o. y/o female here to follow-up forbloating and abdominal distention which could be due to adhesions from her prior abdominal surgery. She has had this issues for many years she states.  In the past to have suggested to her on multiple occasions that her nausea and vomiting and upper GI symptoms are very likely due to aerophagia from smoking marijuana and cigarettes.  She also probably has hyperemesis related to marijuana.  She is not very keen on stopping the marijuana as it helps her with her bipolar.  She has had a Schatzki's ring in the past but it was nonobstructing and hence does not require dilation.  I do not believe that the Schatzki's ring is causing her dysphagia.   Plan 1.  I will obtain a barium swallow with tablet to check for any obstruction at any point and if there is she would need a dilation.  If there is no obstruction I strongly believe the majority of her symptoms are due to aerophagia and due to the effects of marijuana.  I informed her that there are no specific medications thatI  can treat her for that with.  Abstinence is the best form of treatment.    Dr Jonathon Bellows  MD,MRCP [facility]) Follow up in 4 to 6 weeks

## 2020-01-31 ENCOUNTER — Encounter: Payer: Self-pay | Admitting: Family

## 2020-01-31 ENCOUNTER — Other Ambulatory Visit: Payer: Self-pay

## 2020-01-31 ENCOUNTER — Ambulatory Visit (INDEPENDENT_AMBULATORY_CARE_PROVIDER_SITE_OTHER): Payer: Medicare Other | Admitting: Family

## 2020-01-31 ENCOUNTER — Other Ambulatory Visit: Payer: Self-pay | Admitting: Gastroenterology

## 2020-01-31 VITALS — BP 100/68 | HR 76 | Ht 63.0 in | Wt 173.2 lb

## 2020-01-31 DIAGNOSIS — I10 Essential (primary) hypertension: Secondary | ICD-10-CM | POA: Diagnosis not present

## 2020-01-31 DIAGNOSIS — I25118 Atherosclerotic heart disease of native coronary artery with other forms of angina pectoris: Secondary | ICD-10-CM

## 2020-01-31 DIAGNOSIS — K219 Gastro-esophageal reflux disease without esophagitis: Secondary | ICD-10-CM

## 2020-01-31 DIAGNOSIS — Z720 Tobacco use: Secondary | ICD-10-CM

## 2020-01-31 DIAGNOSIS — E785 Hyperlipidemia, unspecified: Secondary | ICD-10-CM

## 2020-01-31 DIAGNOSIS — G4733 Obstructive sleep apnea (adult) (pediatric): Secondary | ICD-10-CM

## 2020-01-31 DIAGNOSIS — R1310 Dysphagia, unspecified: Secondary | ICD-10-CM

## 2020-01-31 MED ORDER — NITROGLYCERIN 0.4 MG SL SUBL
0.4000 mg | SUBLINGUAL_TABLET | SUBLINGUAL | 3 refills | Status: DC | PRN
Start: 2020-01-31 — End: 2020-03-30

## 2020-01-31 MED ORDER — ISOSORBIDE MONONITRATE ER 30 MG PO TB24: 30.0000 mg | ORAL_TABLET | Freq: Every day | ORAL | 1 refills | Status: DC

## 2020-01-31 NOTE — Patient Instructions (Addendum)
Medication Instructions:  No medication changes today.  Your nitroglycerin and Imdur refills were sent.   *If you need a refill on your cardiac medications before your next appointment, please call your pharmacy*  Lab Work: No lab work today.   Testing/Procedures: Your EKG today shows normal sinus rhythm which is a stable finding.   Follow-Up: At [facility], you and your health needs are our priority.  As part of our continuing mission to provide you with exceptional heart care, we have created designated Provider Care Teams.  These Care Teams include your primary Cardiologist (physician) and Advanced Practice Providers (APPs -  Physician Assistants and Nurse Practitioners) who all work together to provide you with the care you need, when you need it.  We recommend signing up for the patient portal called "MyChart".  Sign up information is provided on this After Visit Summary.  MyChart is used to connect with patients for Virtual Visits (Telemedicine).  Patients are able to view lab/test results, encounter notes, upcoming appointments, etc.  Non-urgent messages can be sent to your provider as well.   To learn more about what you can do with MyChart, go to [email or web address].    Your next appointment:   4 month(s)  The format for your next appointment:   In Person  Provider:   You may see Nelva Bush, MD or one of the following Advanced Practice Providers on your designated Care Team:    Murray Hodgkins, NP  Christell Faith, PA-C  Marrianne Mood, PA-C  Laurann Montana, NP  Other Instructions   Fat and Cholesterol Restricted Eating Plan Getting too much fat and cholesterol in your diet may cause health problems. Choosing the right foods helps keep your fat and cholesterol at normal levels. This can keep you from getting certain diseases. What are tips for following this plan? Meal planning  At meals, divide your plate into four equal parts: ? Fill one-half of your  plate with vegetables and green salads. ? Fill one-fourth of your plate with whole grains. ? Fill one-fourth of your plate with low-fat (lean) protein foods.  Eat fish that is high in omega-3 fats at least two times a week. This includes mackerel, tuna, sardines, and salmon.  Eat foods that are high in fiber, such as whole grains, beans, apples, broccoli, carrots, peas, and barley. General tips   Work with your doctor to lose weight if you need to.  Avoid: ? Foods with added sugar. ? Fried foods. ? Foods with partially hydrogenated oils.  Limit alcohol intake to no more than 1 drink a day for nonpregnant women and 2 drinks a day for men. One drink equals 12 oz of beer, 5 oz of wine, or 1 oz of hard liquor. Reading food labels  Check food labels for: ? Trans fats. ? Partially hydrogenated oils. ? Saturated fat (g) in each serving. ? Cholesterol (mg) in each serving. ? Fiber (g) in each serving.  Choose foods with healthy fats, such as: ? Monounsaturated fats. ? Polyunsaturated fats. ? Omega-3 fats.  Choose grain products that have whole grains. Look for the word "whole" as the first word in the ingredient list. Cooking  Cook foods using low-fat methods. These include baking, boiling, grilling, and broiling.  Eat more home-cooked foods. Eat at restaurants and buffets less often.  Avoid cooking using saturated fats, such as butter, cream, palm oil, palm kernel oil, and coconut oil. Recommended foods  Fruits  All fresh, canned (in natural juice), or frozen fruits.  Vegetables  Fresh or frozen vegetables (raw, steamed, roasted, or grilled). Green salads. Grains  Whole grains, such as whole wheat or whole grain breads, crackers, cereals, and pasta. Unsweetened oatmeal, bulgur, barley, quinoa, or brown rice. Corn or whole wheat flour tortillas. Meats and other protein foods  Ground beef (85% or leaner), grass-fed beef, or beef trimmed of fat. Skinless chicken or [place].  Ground chicken or [place]. Pork trimmed of fat. All fish and seafood. Egg whites. Dried beans, peas, or lentils. Unsalted nuts or seeds. Unsalted canned beans. Nut butters without added sugar or oil. Dairy  Low-fat or nonfat dairy products, such as skim or 1% milk, 2% or reduced-fat cheeses, low-fat and fat-free ricotta or cottage cheese, or plain low-fat and nonfat yogurt. Fats and oils  Tub margarine without trans fats. Light or reduced-fat mayonnaise and salad dressings. Avocado. Olive, canola, sesame, or safflower oils. The items listed above may not be a complete list of foods and beverages you can eat. Contact a dietitian for more information. Foods to avoid Fruits  Canned fruit in heavy syrup. Fruit in cream or butter sauce. Fried fruit. Vegetables  Vegetables cooked in cheese, cream, or butter sauce. Fried vegetables. Grains  White bread. White pasta. White rice. Cornbread. Bagels, pastries, and croissants. Crackers and snack foods that contain trans fat and hydrogenated oils. Meats and other protein foods  Fatty cuts of meat. Ribs, chicken wings, bacon, sausage, bologna, salami, chitterlings, fatback, hot dogs, bratwurst, and packaged lunch meats. Liver and organ meats. Whole eggs and egg yolks. Chicken and [place] with skin. Fried meat. Dairy  Whole or 2% milk, cream, half-and-half, and cream cheese. Whole milk cheeses. Whole-fat or sweetened yogurt. Full-fat cheeses. Nondairy creamers and whipped toppings. Processed cheese, cheese spreads, and cheese curds. Beverages  Alcohol. Sugar-sweetened drinks such as sodas, lemonade, and fruit drinks. Fats and oils  Butter, stick margarine, lard, shortening, ghee, or bacon fat. Coconut, palm kernel, and palm oils. Sweets and desserts  Corn syrup, sugars, honey, and molasses. Candy. Jam and jelly. Syrup. Sweetened cereals. Cookies, pies, cakes, donuts, muffins, and ice cream. The items listed above may not be a complete list of foods  and beverages you should avoid. Contact a dietitian for more information. Summary  Choosing the right foods helps keep your fat and cholesterol at normal levels. This can keep you from getting certain diseases.  At meals, fill one-half of your plate with vegetables and green salads.  Eat high-fiber foods, like whole grains, beans, apples, carrots, peas, and barley.  Limit added sugar, saturated fats, alcohol, and fried foods. This information is not intended to replace advice given to you by your health care provider. Make sure you discuss any questions you have with your health care provider. Document Revised: 03/03/2018 Document Reviewed: 03/17/2017 Elsevier Patient Education  2020 [place] with Quitting Smoking  Quitting smoking is a physical and mental challenge. You will face cravings, withdrawal symptoms, and temptation. Before quitting, work with your health care provider to make a plan that can help you cope. Preparation can help you quit and keep you from giving in. How can I cope with cravings? Cravings usually last for 5-10 minutes. If you get through it, the craving will pass. Consider taking the following actions to help you cope with cravings:  Keep your mouth busy: ? Chew sugar-free gum. ? Suck on hard candies or a straw. ? Brush your teeth.  Keep your hands and body busy: ? Immediately change to a  different activity when you feel a craving. ? Squeeze or play with a ball. ? Do an activity or a hobby, like making bead jewelry, practicing needlepoint, or working with wood. ? Mix up your normal routine. ? Take a short exercise break. Go for a quick walk or run up and down stairs. ? Spend time in public places where smoking is not allowed.  Focus on doing something kind or helpful for someone else.  Call a friend or family member to talk during a craving.  Join a support group.  Call a quit line, such as 1-800-QUIT-NOW.  Talk with your health care  provider about medicines that might help you cope with cravings and make quitting easier for you. How can I deal with withdrawal symptoms? Your body may experience negative effects as it tries to get used to not having nicotine in the system. These effects are called withdrawal symptoms. They may include:  Feeling hungrier than normal.  Trouble concentrating.  Irritability.  Trouble sleeping.  Feeling depressed.  Restlessness and agitation.  Craving a cigarette. To manage withdrawal symptoms:  Avoid places, people, and activities that trigger your cravings.  Remember why you want to quit.  Get plenty of sleep.  Avoid coffee and other caffeinated drinks. These may worsen some of your symptoms. How can I handle social situations? Social situations can be difficult when you are quitting smoking, especially in the first few weeks. To manage this, you can:  Avoid parties, bars, and other social situations where people might be smoking.  Avoid alcohol.  Leave right away if you have the urge to smoke.  Explain to your family and friends that you are quitting smoking. Ask for understanding and support.  Plan activities with friends or family where smoking is not an option. What are some ways I can cope with stress? Wanting to smoke may cause stress, and stress can make you want to smoke. Find ways to manage your stress. Relaxation techniques can help. For example:  Breathe slowly and deeply, in through your nose and out through your mouth.  Listen to soothing, relaxing music.  Talk with a family member or friend about your stress.  Light a candle.  Soak in a bath or take a shower.  Think about a peaceful place. What are some ways I can prevent weight gain? Be aware that many people gain weight after they quit smoking. However, not everyone does. To keep from gaining weight, have a plan in place before you quit and stick to the plan after you quit. Your plan should  include:  Having healthy snacks. When you have a craving, it may help to: ? Eat plain popcorn, crunchy carrots, celery, or other cut vegetables. ? Chew sugar-free gum.  Changing how you eat: ? Eat small portion sizes at meals. ? Eat 4-6 small meals throughout the day instead of 1-2 large meals a day. ? Be mindful when you eat. Do not watch television or do other things that might distract you as you eat.  Exercising regularly: ? Make time to exercise each day. If you do not have time for a long workout, do short bouts of exercise for 5-10 minutes several times a day. ? Do some form of strengthening exercise, like weight lifting, and some form of aerobic exercise, like running or swimming.  Drinking plenty of water or other low-calorie or no-calorie drinks. Drink 6-8 glasses of water daily, or as much as instructed by your health care provider. Summary  Quitting  smoking is a physical and mental challenge. You will face cravings, withdrawal symptoms, and temptation to smoke again. Preparation can help you as you go through these challenges.  You can cope with cravings by keeping your mouth busy (such as by chewing gum), keeping your body and hands busy, and making calls to family, friends, or a helpline for people who want to quit smoking.  You can cope with withdrawal symptoms by avoiding places where people smoke, avoiding drinks with caffeine, and getting plenty of rest.  Ask your health care provider about the different ways to prevent weight gain, avoid stress, and handle social situations. This information is not intended to replace advice given to you by your health care provider. Make sure you discuss any questions you have with your health care provider. Document Revised: 06/12/2017 Document Reviewed: 06/27/2016 Elsevier Patient Education  2020 [employer].

## 2020-01-31 NOTE — Progress Notes (Signed)
Office Visit    Patient Name: Shawna Anderson Date of Encounter: 01/31/2020  Primary Care Provider:  McLean-Scocuzza, Nino Glow, MD Primary Cardiologist:  Nelva Bush, MD Electrophysiologist:  None   Chief Complaint    AREESHA DEHAVEN is a 54 y.o. female with a hx of CAD (2010 BMS to LCx: 2017 restenosis of LCx), HTN, HLD, GERD, sleep apnea, asthma, current smoker with both tobacco and marijuana, bipolar/anxiety presents today for CAD follow-up  Past Medical History    Past Medical History:  Diagnosis Date  . Anxiety   . Asthma   . Bipolar 1 disorder ([place])   . Bipolar disorder ([place])   . CAD (coronary artery disease)    s/p stent BMS OM Cx  . Cervical herniated disc 04/12/2016  . COPD (chronic obstructive pulmonary disease) ([place])   . Depression   . Diabetes mellitus without complication ([place])   . Diverticulitis   . GERD (gastroesophageal reflux disease)   . Glaucoma   . History of blood transfusion   . Hyperlipidemia   . Hypertension   . OSA (obstructive sleep apnea)    not using cpap   . Plantar fasciitis    b/l feet s/p steroid shots w/o help and left surgery w/o help   . UTI (urinary tract infection)    Past Surgical History:  Procedure Laterality Date  . ABDOMINAL HYSTERECTOMY    . ABDOMINAL SURGERY  1995   Bowel resection.  Marland Kitchen CARDIAC CATHETERIZATION N/A 04/14/2016   Procedure: Left Heart Cath and Coronary Angiography;  Surgeon: Burnell Blanks, MD;  Location: [place] [facility];  Service: Cardiovascular;  Laterality: N/A;  . CARDIAC SURGERY    . COLONOSCOPY WITH PROPOFOL N/A 07/11/2019   Procedure: COLONOSCOPY WITH PROPOFOL;  Surgeon: Jonathon Bellows, MD;  Location: [facility] ENDOSCOPY;  Service: Gastroenterology;  Laterality: N/A;  . COLONOSCOPY WITH PROPOFOL N/A 07/29/2019   Procedure: COLONOSCOPY WITH PROPOFOL;  Surgeon: Jonathon Bellows, MD;  Location: [facility] ENDOSCOPY;  Service: Gastroenterology;  Laterality: N/A;  . CORONARY ANGIOPLASTY WITH STENT  PLACEMENT  2010   Drug eluting stent  . ESOPHAGOGASTRODUODENOSCOPY (EGD) WITH PROPOFOL N/A 07/11/2019   Procedure: ESOPHAGOGASTRODUODENOSCOPY (EGD) WITH PROPOFOL;  Surgeon: Jonathon Bellows, MD;  Location: [facility] ENDOSCOPY;  Service: Gastroenterology;  Laterality: N/A;  . OVARIAN CYST REMOVAL      Allergies  Allergies  Allergen Reactions  . Ativan [Lorazepam]     Pt states it makes her tongue do weird things   . Latex Other (See Comments)    Patient stated that she was told by her doctor that she is "allergic to" this  . Tape Other (See Comments)    Patient stated that she was told by her doctor that she is "allergic to" this  . Xifaxan [Rifaximin]     Pt believes this is contributing to her abdominal pain/discomfort  . Drixoral Allergy Sinus [Dexbromphen-Pse-Apap Er] Rash    History of Present Illness    Shawna Anderson is a 54 y.o. female with a hx of CAD (2010 BMS to LCx: 2017 restenosis of LCx), HTN, HLD, GERD, sleep apnea, asthma, current smoker with both tobacco and marijuana, bipolar/anxiety last seen 06/29/2019 by Elenor Quinones, PA.  CAD was initially discovered during preop work-up for prior ovarian cyst removal in 2010. BMS placed ostial/proximal LCx. She was admitted to [facility] 03/1016 for chest pain after being off multiple medications due to financial constraints. Catheterization showed moderate in-stent restenosis of the ostial/proximal left circumflex stent. Recommended  for medical management. She has had multiple office visits since that time reporting stable anginal symptoms with continued medication adjustments. She does use nitroglycerin as needed. Imdur 30 mg daily was added to her office visit in December due to anginal symptoms and cardiac catheterization was offered though she declined.  She saw Dr. Vicente Males of GI yesterday and has plans for barium swallow due to dysphagia.  Reports incidences of chest pain have decreased since addition of Imdur. Uses nitroglycerin  sparingly. Reports her dyspnea on exertion is stable at baseline. No formal exercise regimen, shares with me that she is limited by plantar fascitis. Continues to smoke, but has purchased nicotine patches and is planning to quit. BP at home has been checked intermittently and labile. She does note indiscretion in what time she takes her medications, for example did not take her evening medicines until 2am this morning.   EKGs/Labs/Other Studies Reviewed:   The following studies were reviewed today:   Echo 03/16/2019  1. The left ventricle has normal systolic function with an ejection fraction of 60-65%. The cavity size was normal. Left ventricular diastolic Doppler parameters are consistent with impaired relaxation.  2. The right ventricle has normal systolic function. The cavity was normal. There is no increase in right ventricular wall thickness.Unable to estimate RVSP.   04/2016 LHC  Mid RCA lesion, 30 %stenosed.  Ost RPDA to RPDA lesion, 20 %stenosed.  Mid RCA to Dist RCA lesion, 10 %stenosed.  Ost Cx to Prox Cx lesion, 50 %stenosed.  Prox LAD to Mid LAD lesion, 20 %stenosed.  The left ventricular ejection fraction is greater than 65% by visual estimate.  The left ventricular systolic function is normal.  LV end diastolic pressure is normal.  There is no mitral valve regurgitation.   1. Single vessel CAD  2. Patent stent in the ostium of the Circumflex with moderate restenosis. This does not appear to be flow limiting. 3. Mild non-obstructive disease in the RCA and LAD.  4. Normal LV function  EKG:  EKG is ordered today.  The ekg ordered today demonstrates NSR 74 bpm no acute ST/T wave changes.  Recent Labs: 11/15/2019: ALT 41; BUN 19; Creatinine, Ser 0.92; Magnesium 2.3; Potassium 3.6; Sodium 141 01/13/2020: Hemoglobin 13.6; Platelets 444  Recent Lipid Panel    Component Value Date/Time   CHOL 236 (H) 07/07/2019 0847   TRIG 238 (H) 07/07/2019 0847   HDL 39 (L) 07/07/2019  0847   CHOLHDL 6.1 07/07/2019 0847   VLDL 48 (H) 07/07/2019 0847   LDLCALC 149 (H) 07/07/2019 0847   LDLCALC 193 (H) 12/31/2017 0905   LDLDIRECT 159.0 12/24/2018 1108    Home Medications   Current Meds  Medication Sig  . albuterol (PROVENTIL) (2.5 MG/3ML) 0.083% nebulizer solution Take 3 mLs (2.5 mg total) by nebulization every 6 (six) hours as needed for wheezing or shortness of breath.  Marland Kitchen albuterol (VENTOLIN HFA) 108 (90 Base) MCG/ACT inhaler Inhale into the lungs.  . Albuterol Sulfate (PROAIR RESPICLICK) 469 (90 Base) MCG/ACT AEPB Inhale 1 Inhaler into the lungs every 4 (four) hours as needed.  . ALPRAZolam (XANAX) 0.25 MG tablet Take 1 tablet (0.25 mg total) by mouth at bedtime as needed for anxiety or sleep.  Marland Kitchen amLODipine (NORVASC) 10 MG tablet Take 1 tablet (10 mg total) by mouth at bedtime.  Marland Kitchen aspirin EC 81 MG tablet Take 1 tablet (81 mg total) by mouth daily.  Marland Kitchen atorvastatin (LIPITOR) 80 MG tablet Take 1 tablet (80 mg total) by  mouth daily at 6 PM.  . Cholecalciferol 1.25 MG (50000 UT) capsule Take 1 capsule (50,000 Units total) by mouth once a week.  . clopidogrel (PLAVIX) 75 MG tablet Take 1 tablet (75 mg total) by mouth daily.  . cyclobenzaprine (FLEXERIL) 5 MG tablet Take 1 tablet (5 mg total) by mouth at bedtime as needed for muscle spasms.  . diclofenac Sodium (VOLTAREN) 1 % GEL Apply 2 g topically 4 (four) times daily. Prn OA and chest wall pain  . dicyclomine (BENTYL) 10 MG capsule Take 1 capsule (10 mg total) by mouth 4 (four) times daily -  before meals and at bedtime.  . divalproex (DEPAKOTE) 500 MG DR tablet Take 1 tablet (500 mg total) by mouth 2 (two) times daily.  Marland Kitchen ezetimibe (ZETIA) 10 MG tablet Take 1 tablet (10 mg total) by mouth daily. With lipitor 80 mg at night  . hyoscyamine (LEVSIN) 0.125 MG tablet Take 1 tablet (0.125 mg total) by mouth every 4 (four) hours. As needed  . ipratropium (ATROVENT) 0.06 % nasal spray Place 2 sprays into both nostrils 3 (three)  times daily.  Marland Kitchen ipratropium-albuterol (DUONEB) 0.5-2.5 (3) MG/3ML SOLN Take 3 mLs by nebulization every 6 (six) hours as needed.  . isosorbide mononitrate (IMDUR) 30 MG 24 hr tablet Take 1 tablet (30 mg total) by mouth daily.  Marland Kitchen levocetirizine (XYZAL) 5 MG tablet Take 1 tablet (5 mg total) by mouth at bedtime as needed for allergies.  Marland Kitchen lidocaine (LIDODERM) 5 % Place 1 patch onto the skin daily. Remove & Discard patch within 12 hours or as directed by MD  . nitroGLYCERIN (NITROSTAT) 0.4 MG SL tablet Place 1 tablet (0.4 mg total) under the tongue every 5 (five) minutes as needed for chest pain. X 2 call 911 after 3rd dose  . olmesartan-hydrochlorothiazide (BENICAR HCT) 40-25 MG tablet Take 1 tablet by mouth daily.  . ondansetron (ZOFRAN ODT) 4 MG disintegrating tablet Take 1 tablet (4 mg total) by mouth every 8 (eight) hours as needed for nausea or vomiting.  . pantoprazole (PROTONIX) 40 MG tablet TAKE 1 TABLET BY MOUTH  TWICE DAILY BEFORE MEALS  . promethazine (PHENERGAN) 25 MG suppository Place 1 suppository (25 mg total) rectally every 6 (six) hours as needed for nausea.  Marland Kitchen senna-docusate (SENOKOT-S) 8.6-50 MG tablet Take 1-2 tablets by mouth daily as needed for mild constipation.  . sodium chloride (OCEAN) 0.65 % SOLN nasal spray Place 2 sprays into both nostrils as needed for congestion.  . traZODone (DESYREL) 150 MG tablet Take 1 tablet (150 mg total) by mouth at bedtime as needed for sleep.      Review of Systems    Review of Systems  Constitutional: Negative for chills, fever and malaise/fatigue.  Cardiovascular: Positive for chest pain and dyspnea on exertion. Negative for irregular heartbeat, leg swelling, near-syncope, orthopnea, palpitations and syncope.  Respiratory: Negative for cough, shortness of breath and wheezing.   Gastrointestinal: Negative for melena, nausea and vomiting.  Genitourinary: Negative for hematuria.  Neurological: Negative for dizziness, light-headedness and  weakness.   All other systems reviewed and are otherwise negative except as noted above.  Physical Exam    VS:  BP 100/68 (BP Location: Left Arm, Patient Position: Sitting, Cuff Size: Normal)   Pulse 76   Ht 5\' 3"  (1.6 m)   Wt 173 lb 4 oz (78.6 kg)   SpO2 98%   BMI 30.69 kg/m  , BMI Body mass index is 30.69 kg/m. GEN: Well nourished, overweight,  well developed, in no acute distress. HEENT: normal. Neck: Supple, no JVD, carotid bruits, or masses. Cardiac: RRR, no murmurs, rubs, or gallops. No clubbing, cyanosis, edema.  Radials/DP/PT 2+ and equal bilaterally.  Respiratory:  Respirations regular and unlabored, clear to auscultation bilaterally. GI: Soft, nontender, nondistended, BS + x 4. MS: No deformity or atrophy. Skin: Warm and dry, no rash. Neuro:  Strength and sensation are intact. Psych: Normal affect.  Assessment & Plan    1. CAD -PCI to LCx 2010 and moderate restenosis 2017 recommended for medical management. Low risk Lexiscan 02/2019. Reports chest pain has improved since addition of Imdur. Unable to further uptitrate due to relative hypotension. Endorses using nitroglycerin once per month or less. Stable anginal symptoms. EKG today with no acute St/T wave changes. No indication for ischemic evaluation at this time.  2. Anemia -continue to follow with hematology.  3. HTN - BP well controlled. Continue current antihypertensive regimen. Some labile BP readings at home, encouraged to take BP medications on consistent timeline.  4. HLD, LDL goal less than 70 - 06/2019 LDL 149. On Atorvastatin 80mg  and Zetia 10mg  daily. Endorses eating lipid lowering diet. Does endorse multiple missed doses over last few months due to vomiting for which she is following with GI. Will need updated lipid panel with next lab collection. Ultimately would benefit from PCSK9i, but politely declines.   5. Tobacco use - Plans to utilize nicotine patches, has them at home. Discussed how to cope with  cravings. Smoking cessation encouraged. Recommend utilization of 1800QUITNOW  6. DM2 - Continue to follow with PCP.  7. OSA not on CPAP - Discussed contribution to HTN. Continues to decline CPAP.   8. Murmur - Noted in clinic 06/2019. Not auscultated today. Defer echocardiogram at this time.   Disposition: Follow up in 4 month(s) with Dr. Saunders Revel or APP   Loel Dubonnet, NP 01/31/2020, 2:33 PM

## 2020-02-01 ENCOUNTER — Other Ambulatory Visit: Payer: Self-pay | Admitting: Gastroenterology

## 2020-02-05 ENCOUNTER — Other Ambulatory Visit: Payer: Self-pay | Admitting: Internal Medicine

## 2020-02-05 DIAGNOSIS — F319 Bipolar disorder, unspecified: Secondary | ICD-10-CM

## 2020-02-05 DIAGNOSIS — I10 Essential (primary) hypertension: Secondary | ICD-10-CM

## 2020-02-05 MED ORDER — OLMESARTAN MEDOXOMIL-HCTZ 40-25 MG PO TABS: 1.0000 | ORAL_TABLET | Freq: Every day | ORAL | 3 refills | Status: DC

## 2020-02-05 MED ORDER — DIVALPROEX SODIUM 500 MG PO DR TAB: 500.0000 mg | DELAYED_RELEASE_TABLET | Freq: Two times a day (BID) | ORAL | 1 refills | Status: DC

## 2020-02-14 ENCOUNTER — Other Ambulatory Visit: Payer: Self-pay

## 2020-02-14 ENCOUNTER — Ambulatory Visit
Admission: RE | Admit: 2020-02-14 | Discharge: 2020-02-14 | Disposition: A | Discharge status: Home / Self Care | Payer: Medicare Other | Source: Ambulatory Visit | Attending: Gastroenterology | Admitting: Gastroenterology

## 2020-02-14 DIAGNOSIS — R1310 Dysphagia, unspecified: Secondary | ICD-10-CM | POA: Diagnosis not present

## 2020-02-20 ENCOUNTER — Telehealth: Payer: Self-pay

## 2020-02-20 ENCOUNTER — Encounter: Payer: Self-pay | Admitting: Gastroenterology

## 2020-02-20 NOTE — Telephone Encounter (Signed)
-----   Message from Jonathon Bellows, MD sent at 02/20/2020 10:49 AM EDT ----- Normal Barium. I strongly believe the majority of her symptoms are due to aerophagia and due to the effects of marijuana.  Inform  her that there are no specific medications that I  can treat her for that with.  Abstinence is the best form of treatment.

## 2020-02-29 ENCOUNTER — Other Ambulatory Visit: Payer: Self-pay

## 2020-02-29 ENCOUNTER — Other Ambulatory Visit: Payer: Medicare Other

## 2020-02-29 DIAGNOSIS — Z20822 Contact with and (suspected) exposure to covid-19: Secondary | ICD-10-CM

## 2020-03-01 ENCOUNTER — Encounter: Payer: Self-pay | Admitting: Nurse Practitioner

## 2020-03-01 ENCOUNTER — Other Ambulatory Visit: Payer: Self-pay

## 2020-03-01 ENCOUNTER — Telehealth (INDEPENDENT_AMBULATORY_CARE_PROVIDER_SITE_OTHER): Payer: Medicare Other | Admitting: Nurse Practitioner

## 2020-03-01 VITALS — HR 89 | Temp 98.3°F | Ht 63.0 in | Wt 179.0 lb

## 2020-03-01 DIAGNOSIS — J449 Chronic obstructive pulmonary disease, unspecified: Secondary | ICD-10-CM

## 2020-03-01 DIAGNOSIS — J069 Acute upper respiratory infection, unspecified: Secondary | ICD-10-CM | POA: Diagnosis not present

## 2020-03-01 DIAGNOSIS — R1032 Left lower quadrant pain: Secondary | ICD-10-CM

## 2020-03-01 LAB — NOVEL CORONAVIRUS, NAA: SARS-CoV-2, NAA: NOT DETECTED

## 2020-03-01 LAB — SARS-COV-2, NAA 2 DAY TAT

## 2020-03-01 NOTE — Progress Notes (Signed)
Virtual Visit via Video Note  This visit type was conducted due to national recommendations for restrictions regarding the COVID-19 pandemic (e.g. social distancing).  This format is felt to be most appropriate for this patient at this time.  All issues noted in this document were discussed and addressed.  No physical exam was performed (except for noted visual exam findings with Video Visits).   I connected with@ on 03/01/20 at  4:30 PM EDT by a video enabled telemedicine application or telephone and verified that I am speaking with the correct person using two identifiers. Location patient: home Location provider: work  Persons participating in the virtual visit: patient, provider  I discussed the limitations, risks, security and privacy concerns of performing an evaluation and management service by telephone and the availability of in person appointments. I also discussed with the patient that there may be a patient responsible charge related to this service. The patient expressed understanding and agreed to proceed.   Reason for visit:  1. A 2 week hx of cold sx with negative Covid test yest  and  2. A one week hx of LLQ abdominal pain- constant and severe.  HPI: This 54 yo with hx of asthma, COPD, OSA, CAD, diverticulitis, constipation reports a 2 week hx of nasal congestion and a cold with cough that she can't resolve. She has ill contacts with the same symptoms and all tested negative for Covid.  She has  been using her albuterol inhaler x2 per day, and the nebulizer- x2 per day, Advil cold and flu, Atrovent nasal spray and still having congestion and SOB with some wheezing.  She has been using NTG again for CP and she has used it 2-3 times this month- last used it one week ago and it helps. No CP or pain with breathing today. She feels fatigued. Covid test negative yesterday. She had the [place] vaccine 11/04/19/and 11/18/19.   She is also reporting pain left lower abdomen  x 1 week to the  touch and she has it when she walks. She has an ovarian cyst and it acts up in the past. This feels different and Tylenol does not help it at all. She rates this as severe and continuous, " I have never had this before."  She does have a BM daily- not  constipated- no blood in stool . Bentyl QID. She is eating and drinking WNL. She has days where she vomits- baseline for years- no new upset. Dr.  Vicente Males sent her for a Barium swallow and it was negative for esophageal stricture and she reports an EGD was not needed. She had EGD/colonoscopy in Jan 2021 with benign findings.   ROS: See pertinent positives and negatives per HPI.  Past Medical History:  Diagnosis Date  . Anxiety   . Asthma   . Bipolar 1 disorder ([place])   . Bipolar disorder ([place])   . CAD (coronary artery disease)    s/p stent BMS OM Cx  . Cervical herniated disc 04/12/2016  . COPD (chronic obstructive pulmonary disease) ([place])   . Depression   . Diabetes mellitus without complication ([place])   . Diverticulitis   . GERD (gastroesophageal reflux disease)   . Glaucoma   . History of blood transfusion   . Hyperlipidemia   . Hypertension   . OSA (obstructive sleep apnea)    not using cpap   . Plantar fasciitis    b/l feet s/p steroid shots w/o help and left surgery w/o help   .  UTI (urinary tract infection)     Past Surgical History:  Procedure Laterality Date  . ABDOMINAL HYSTERECTOMY    . ABDOMINAL SURGERY  1995   Bowel resection.  Marland Kitchen CARDIAC CATHETERIZATION N/A 04/14/2016   Procedure: Left Heart Cath and Coronary Angiography;  Surgeon: Burnell Blanks, MD;  Location: [place] [facility];  Service: Cardiovascular;  Laterality: N/A;  . CARDIAC SURGERY    . COLONOSCOPY WITH PROPOFOL N/A 07/11/2019   Procedure: COLONOSCOPY WITH PROPOFOL;  Surgeon: Jonathon Bellows, MD;  Location: [facility] ENDOSCOPY;  Service: Gastroenterology;  Laterality: N/A;  . COLONOSCOPY WITH PROPOFOL N/A 07/29/2019   Procedure: COLONOSCOPY WITH PROPOFOL;   Surgeon: Jonathon Bellows, MD;  Location: [facility] ENDOSCOPY;  Service: Gastroenterology;  Laterality: N/A;  . CORONARY ANGIOPLASTY WITH STENT PLACEMENT  2010   Drug eluting stent  . ESOPHAGOGASTRODUODENOSCOPY (EGD) WITH PROPOFOL N/A 07/11/2019   Procedure: ESOPHAGOGASTRODUODENOSCOPY (EGD) WITH PROPOFOL;  Surgeon: Jonathon Bellows, MD;  Location: [facility] ENDOSCOPY;  Service: Gastroenterology;  Laterality: N/A;  . OVARIAN CYST REMOVAL      Family History  Problem Relation Age of Onset  . CAD Mother   . Depression Mother   . Heart disease Mother   . Hyperlipidemia Mother   . Hypertension Mother   . CAD Brother   . Depression Brother   . Diabetes Brother   . Heart disease Brother   . Hyperlipidemia Brother   . Heart disease Father   . Alcohol abuse Father   . Lupus Other   . Sickle cell anemia Other     SOCIAL HX: Pos tobacco   Current Outpatient Medications:  .  albuterol (PROVENTIL) (2.5 MG/3ML) 0.083% nebulizer solution, Take 3 mLs (2.5 mg total) by nebulization every 6 (six) hours as needed for wheezing or shortness of breath., Disp: 360 mL, Rfl: 12 .  albuterol (VENTOLIN HFA) 108 (90 Base) MCG/ACT inhaler, Inhale into the lungs., Disp: , Rfl:  .  Albuterol Sulfate (PROAIR RESPICLICK) 270 (90 Base) MCG/ACT AEPB, Inhale 1 Inhaler into the lungs every 4 (four) hours as needed., Disp: 3 each, Rfl: 4 .  ALPRAZolam (XANAX) 0.25 MG tablet, Take 1 tablet (0.25 mg total) by mouth at bedtime as needed for anxiety or sleep., Disp: 30 tablet, Rfl: 2 .  amLODipine (NORVASC) 10 MG tablet, Take 1 tablet (10 mg total) by mouth at bedtime., Disp: 90 tablet, Rfl: 3 .  aspirin EC 81 MG tablet, Take 1 tablet (81 mg total) by mouth daily., Disp: 90 tablet, Rfl: 3 .  Cholecalciferol 1.25 MG (50000 UT) capsule, Take 1 capsule (50,000 Units total) by mouth once a week., Disp: 13 capsule, Rfl: 1 .  clopidogrel (PLAVIX) 75 MG tablet, Take 1 tablet (75 mg total) by mouth daily., Disp: 90 tablet, Rfl: 3 .   cyclobenzaprine (FLEXERIL) 5 MG tablet, Take 1 tablet (5 mg total) by mouth at bedtime as needed for muscle spasms., Disp: 90 tablet, Rfl: 3 .  diclofenac Sodium (VOLTAREN) 1 % GEL, Apply 2 g topically 4 (four) times daily. Prn OA and chest wall pain, Disp: 450 g, Rfl: 3 .  dicyclomine (BENTYL) 10 MG capsule, TAKE 1 CAPSULE BY MOUTH 4  TIMES DAILY BEFORE MEALS  AND AT BEDTIME, Disp: 360 capsule, Rfl: 1 .  divalproex (DEPAKOTE) 500 MG DR tablet, Take 1 tablet (500 mg total) by mouth 2 (two) times daily., Disp: 180 tablet, Rfl: 1 .  ezetimibe (ZETIA) 10 MG tablet, Take 1 tablet (10 mg total) by mouth daily. With lipitor 80  mg at night, Disp: 90 tablet, Rfl: 3 .  ipratropium (ATROVENT) 0.06 % nasal spray, Place 2 sprays into both nostrils 3 (three) times daily., Disp: 45 mL, Rfl: 3 .  ipratropium-albuterol (DUONEB) 0.5-2.5 (3) MG/3ML SOLN, Take 3 mLs by nebulization every 6 (six) hours as needed., Disp: 360 mL, Rfl: 11 .  isosorbide mononitrate (IMDUR) 30 MG 24 hr tablet, Take 1 tablet (30 mg total) by mouth daily., Disp: 90 tablet, Rfl: 1 .  levocetirizine (XYZAL) 5 MG tablet, Take 1 tablet (5 mg total) by mouth at bedtime as needed for allergies., Disp: 90 tablet, Rfl: 3 .  lidocaine (LIDODERM) 5 %, Place 1 patch onto the skin daily. Remove & Discard patch within 12 hours or as directed by MD, Disp: 30 patch, Rfl: 0 .  nitroGLYCERIN (NITROSTAT) 0.4 MG SL tablet, Place 1 tablet (0.4 mg total) under the tongue every 5 (five) minutes as needed for chest pain. X 2 call 911 after 3rd dose, Disp: 25 tablet, Rfl: 3 .  olmesartan-hydrochlorothiazide (BENICAR HCT) 40-25 MG tablet, Take 1 tablet by mouth daily., Disp: 90 tablet, Rfl: 3 .  ondansetron (ZOFRAN ODT) 4 MG disintegrating tablet, Take 1 tablet (4 mg total) by mouth every 8 (eight) hours as needed for nausea or vomiting., Disp: 270 tablet, Rfl: 3 .  pantoprazole (PROTONIX) 40 MG tablet, TAKE 1 TABLET BY MOUTH  TWICE DAILY BEFORE MEALS, Disp: 180 tablet,  Rfl: 3 .  promethazine (PHENERGAN) 25 MG suppository, Place 1 suppository (25 mg total) rectally every 6 (six) hours as needed for nausea., Disp: 12 suppository, Rfl: 1 .  senna-docusate (SENOKOT-S) 8.6-50 MG tablet, Take 1-2 tablets by mouth daily as needed for mild constipation., Disp: 90 tablet, Rfl: 3 .  sodium chloride (OCEAN) 0.65 % SOLN nasal spray, Place 2 sprays into both nostrils as needed for congestion., Disp: 90 mL, Rfl: 3 .  traZODone (DESYREL) 150 MG tablet, Take 1 tablet (150 mg total) by mouth at bedtime as needed for sleep., Disp: 90 tablet, Rfl: 3 .  atorvastatin (LIPITOR) 80 MG tablet, Take 1 tablet (80 mg total) by mouth daily at 6 PM., Disp: 90 tablet, Rfl: 3 .  hyoscyamine (LEVSIN) 0.125 MG tablet, Take 1 tablet (0.125 mg total) by mouth every 4 (four) hours. As needed, Disp: 180 tablet, Rfl: 5  EXAM:  VITALS per patient if applicable:  GENERAL: alert, oriented, appears fatigued  and in no acute distress  HEENT: atraumatic, conjunctiva clear, no obvious abnormalities on inspection of external nose and ears  NECK: normal movements of the head and neck  LUNGS: on inspection no signs of respiratory distress, breathing rate appears normal, no obvious gross SOB, gasping or wheezing. Positive coughing.  CV: no obvious cyanosis  ABDOMEN: Points to left lower abdomen as site of pain.   MS: moves all visible extremities without noticeable abnormality  PSYCH/NEURO: pleasant and cooperative, no obvious depression or anxiety, speech and thought processing grossly intact  ASSESSMENT AND PLAN:  Discussed the following assessment and plan:  URI with cough and congestion  Continuous LLQ abdominal pain  Chronic obstructive pulmonary disease, unspecified COPD type ([place])  No problem-specific Assessment & Plan notes found for this encounter.   Advised to get an in-person evaluation at [facility] or [facility]- open until 7 pm tonight  for the LLQ abdominal   pain undiagnosed and reports  as severe and present for one week. Explained that a physical exam is important as well as any labs  or imagine that may be need to make the diagnosis.   In addition, her lungs can be auscultated and if needed a CXR can be performed. I hesitate to treat a PULM infection with prednisone if the abdomen pain is not evaluated and diagnosed. She voices understanding, but may not go tonight. I advised that she seek treatment tonight. She voices understanding.   I discussed the assessment and treatment plan with the patient. The patient was provided an opportunity to ask questions and all were answered. The patient agreed with the plan and demonstrated an understanding of the instructions.   The patient was advised to call back or seek an in-person evaluation if the symptoms worsen or if the condition fails to improve as anticipated.  I provided 15 minutes of non-face-to-face time during this encounter.  Denice Paradise, NP Adult Nurse Practitioner [place] [phone]

## 2020-03-01 NOTE — Patient Instructions (Addendum)
Advised to seek in-person medical evaluation at [facility] or [facility] now for left lower abdomen pain and your cough and congestion.

## 2020-03-02 ENCOUNTER — Other Ambulatory Visit: Payer: Self-pay

## 2020-03-02 ENCOUNTER — Emergency Department: Payer: Medicare Other

## 2020-03-02 ENCOUNTER — Encounter: Payer: Self-pay | Admitting: Emergency Medicine

## 2020-03-02 ENCOUNTER — Emergency Department
Admission: EM | Admit: 2020-03-02 | Discharge: 2020-03-02 | Disposition: A | Discharge status: Home / Self Care | Payer: Medicare Other | Attending: Emergency Medicine | Admitting: Emergency Medicine

## 2020-03-02 DIAGNOSIS — Y999 Unspecified external cause status: Secondary | ICD-10-CM | POA: Insufficient documentation

## 2020-03-02 DIAGNOSIS — Z7982 Long term (current) use of aspirin: Secondary | ICD-10-CM | POA: Insufficient documentation

## 2020-03-02 DIAGNOSIS — S76812A Strain of other specified muscles, fascia and tendons at thigh level, left thigh, initial encounter: Secondary | ICD-10-CM | POA: Diagnosis not present

## 2020-03-02 DIAGNOSIS — J206 Acute bronchitis due to rhinovirus: Secondary | ICD-10-CM | POA: Diagnosis not present

## 2020-03-02 DIAGNOSIS — J45909 Unspecified asthma, uncomplicated: Secondary | ICD-10-CM | POA: Diagnosis not present

## 2020-03-02 DIAGNOSIS — Z91040 Latex allergy status: Secondary | ICD-10-CM | POA: Insufficient documentation

## 2020-03-02 DIAGNOSIS — I2510 Atherosclerotic heart disease of native coronary artery without angina pectoris: Secondary | ICD-10-CM | POA: Diagnosis not present

## 2020-03-02 DIAGNOSIS — I10 Essential (primary) hypertension: Secondary | ICD-10-CM | POA: Diagnosis not present

## 2020-03-02 DIAGNOSIS — F17210 Nicotine dependence, cigarettes, uncomplicated: Secondary | ICD-10-CM | POA: Diagnosis not present

## 2020-03-02 DIAGNOSIS — Z955 Presence of coronary angioplasty implant and graft: Secondary | ICD-10-CM | POA: Diagnosis not present

## 2020-03-02 DIAGNOSIS — X58XXXA Exposure to other specified factors, initial encounter: Secondary | ICD-10-CM | POA: Insufficient documentation

## 2020-03-02 DIAGNOSIS — Y929 Unspecified place or not applicable: Secondary | ICD-10-CM | POA: Diagnosis not present

## 2020-03-02 DIAGNOSIS — Y939 Activity, unspecified: Secondary | ICD-10-CM | POA: Insufficient documentation

## 2020-03-02 DIAGNOSIS — J449 Chronic obstructive pulmonary disease, unspecified: Secondary | ICD-10-CM | POA: Diagnosis not present

## 2020-03-02 DIAGNOSIS — S76811A Strain of other specified muscles, fascia and tendons at thigh level, right thigh, initial encounter: Secondary | ICD-10-CM | POA: Diagnosis not present

## 2020-03-02 DIAGNOSIS — R102 Pelvic and perineal pain unspecified side: Secondary | ICD-10-CM

## 2020-03-02 DIAGNOSIS — R05 Cough: Secondary | ICD-10-CM | POA: Diagnosis not present

## 2020-03-02 DIAGNOSIS — E119 Type 2 diabetes mellitus without complications: Secondary | ICD-10-CM | POA: Diagnosis not present

## 2020-03-02 DIAGNOSIS — Z79899 Other long term (current) drug therapy: Secondary | ICD-10-CM | POA: Diagnosis not present

## 2020-03-02 DIAGNOSIS — Z7902 Long term (current) use of antithrombotics/antiplatelets: Secondary | ICD-10-CM | POA: Insufficient documentation

## 2020-03-02 DIAGNOSIS — S76212A Strain of adductor muscle, fascia and tendon of left thigh, initial encounter: Secondary | ICD-10-CM

## 2020-03-02 DIAGNOSIS — R1032 Left lower quadrant pain: Secondary | ICD-10-CM | POA: Diagnosis present

## 2020-03-02 LAB — URINALYSIS, COMPLETE (UACMP) WITH MICROSCOPIC
Bacteria, UA: NONE SEEN
Bilirubin Urine: NEGATIVE
Glucose, UA: NEGATIVE mg/dL
Ketones, ur: NEGATIVE mg/dL
Leukocytes,Ua: NEGATIVE
Nitrite: NEGATIVE
Protein, ur: NEGATIVE mg/dL
Specific Gravity, Urine: 1.008 (ref 1.005–1.030)
WBC, UA: NONE SEEN WBC/hpf (ref 0–5)
pH: 7 (ref 5.0–8.0)

## 2020-03-02 MED ORDER — BENZONATATE 100 MG PO CAPS: 200.0000 mg | ORAL_CAPSULE | Freq: Three times a day (TID) | ORAL | 0 refills | Status: DC | PRN

## 2020-03-02 MED ORDER — KETOROLAC TROMETHAMINE 60 MG/2ML IM SOLN
60.0000 mg | Freq: Once | INTRAMUSCULAR | Status: AC
  Administered 2020-03-02: 60 mg via INTRAMUSCULAR
  Filled 2020-03-02: qty 2

## 2020-03-02 MED ORDER — METHYLPREDNISOLONE SODIUM SUCC 125 MG IJ SOLR
125.0000 mg | Freq: Once | INTRAMUSCULAR | Status: AC
  Administered 2020-03-02: 125 mg via INTRAMUSCULAR
  Filled 2020-03-02: qty 2

## 2020-03-02 MED ORDER — HYDROCOD POLST-CPM POLST ER 10-8 MG/5ML PO SUER
5.0000 mL | Freq: Once | ORAL | Status: AC
  Administered 2020-03-02: 5 mL via ORAL
  Filled 2020-03-02: qty 5

## 2020-03-02 MED ORDER — TRAMADOL HCL 50 MG PO TABS: 50.0000 mg | ORAL_TABLET | Freq: Four times a day (QID) | ORAL | 0 refills | Status: DC | PRN

## 2020-03-02 MED ORDER — ORPHENADRINE CITRATE 30 MG/ML IJ SOLN
60.0000 mg | Freq: Two times a day (BID) | INTRAMUSCULAR | Status: DC
  Administered 2020-03-02: 60 mg via INTRAMUSCULAR
  Filled 2020-03-02: qty 2

## 2020-03-02 MED ORDER — METHYLPREDNISOLONE 4 MG PO TBPK
ORAL_TABLET | ORAL | 0 refills | Status: DC
Start: 2020-03-02 — End: 2020-03-23

## 2020-03-02 MED ORDER — ETODOLAC 400 MG PO TABS: 400.0000 mg | ORAL_TABLET | Freq: Two times a day (BID) | ORAL | 0 refills | Status: DC

## 2020-03-02 NOTE — ED Provider Notes (Signed)
[facility] Emergency Department Provider Note   ____________________________________________   None    (approximate)  I have reviewed the triage vital signs and the nursing notes.   HISTORY  Chief Complaint Groin Pain and Cough    HPI Shawna Anderson is a 54 y.o. female patient arrived from [place] clinic complaining of left groin pain with cough and congestion for 2 weeks.  Patient states she was tested a few days ago with negative for COVID-19.  Patient said groin pain is similar to her past history of ovarian cyst.  Patient rates her pain as a 7/10.  Patient described pain is "achy".  No palliative measure for complaint.         Past Medical History:  Diagnosis Date  . Anxiety   . Asthma   . Bipolar 1 disorder ([place])   . Bipolar disorder ([place])   . CAD (coronary artery disease)    s/p stent BMS OM Cx  . Cervical herniated disc 04/12/2016  . COPD (chronic obstructive pulmonary disease) ([place])   . Depression   . Diabetes mellitus without complication ([place])   . Diverticulitis   . GERD (gastroesophageal reflux disease)   . Glaucoma   . History of blood transfusion   . Hyperlipidemia   . Hypertension   . OSA (obstructive sleep apnea)    not using cpap   . Plantar fasciitis    b/l feet s/p steroid shots w/o help and left surgery w/o help   . UTI (urinary tract infection)     Patient Active Problem List   Diagnosis Date Noted  . Continuous LLQ abdominal pain 03/01/2020  . URI with cough and congestion 03/01/2020  . Esophageal dysphagia 01/26/2020  . Schatzki's ring of distal esophagus 01/26/2020  . Ecchymosis 01/11/2020  . Chronic pain of both knees 01/11/2020  . Bruising 12/05/2019  . Trichomonas infection 12/05/2019  . Elevated liver enzymes 11/15/2019  . Prediabetes 11/15/2019  . STD exposure 11/15/2019  . Anemia 11/15/2019  . Chronic bursitis of right shoulder 10/25/2019  . Chest wall pain 09/05/2019  . Poor dentition  08/18/2019  . Thrombocytosis ([place]) 08/18/2019  . Leukocytosis 08/18/2019  . Anal lesion 08/18/2019  . Female pelvic pain 05/26/2019  . Colitis 05/26/2019  . HLD (hyperlipidemia) 12/27/2018  . Vitamin D deficiency 12/24/2018  . Bipolar disorder ([place]) 12/30/2017  . OSA (obstructive sleep apnea) 12/30/2017  . Constipation 12/30/2017  . Coronary artery disease of native artery of native heart with stable angina pectoris ([place]) 12/30/2017  . Gastroesophageal reflux disease 12/30/2017  . Asthma 12/29/2017  . COPD (chronic obstructive pulmonary disease) ([place]) 12/29/2017  . Anxiety and depression 12/29/2017  . Insomnia 12/29/2017  . Chronic back pain 12/29/2017  . Skin lesion of back 12/29/2017  . CAD S/P CFX PCI 2010 04/15/2016  . Essential hypertension 04/15/2016  . Noncompliance with medication regimen 04/15/2016  . Chest pain with moderate risk of acute coronary syndrome 04/12/2016  . Abdominal pain 01/12/2015  . Vomiting 01/12/2015  . Nausea and vomiting 01/12/2015    Past Surgical History:  Procedure Laterality Date  . ABDOMINAL HYSTERECTOMY    . ABDOMINAL SURGERY  1995   Bowel resection.  Marland Kitchen CARDIAC CATHETERIZATION N/A 04/14/2016   Procedure: Left Heart Cath and Coronary Angiography;  Surgeon: Burnell Blanks, MD;  Location: [place] CV LAB;  Service: Cardiovascular;  Laterality: N/A;  . CARDIAC SURGERY    . COLONOSCOPY WITH PROPOFOL N/A 07/11/2019   Procedure: COLONOSCOPY WITH PROPOFOL;  Surgeon: Jonathon Bellows, MD;  Location: [facility] ENDOSCOPY;  Service: Gastroenterology;  Laterality: N/A;  . COLONOSCOPY WITH PROPOFOL N/A 07/29/2019   Procedure: COLONOSCOPY WITH PROPOFOL;  Surgeon: Jonathon Bellows, MD;  Location: [facility] ENDOSCOPY;  Service: Gastroenterology;  Laterality: N/A;  . CORONARY ANGIOPLASTY WITH STENT PLACEMENT  2010   Drug eluting stent  . ESOPHAGOGASTRODUODENOSCOPY (EGD) WITH PROPOFOL N/A 07/11/2019   Procedure: ESOPHAGOGASTRODUODENOSCOPY (EGD) WITH PROPOFOL;   Surgeon: Jonathon Bellows, MD;  Location: [facility] ENDOSCOPY;  Service: Gastroenterology;  Laterality: N/A;  . OVARIAN CYST REMOVAL      Prior to Admission medications   Medication Sig Start Date End Date Taking? Authorizing Provider  albuterol (PROVENTIL) (2.5 MG/3ML) 0.083% nebulizer solution Take 3 mLs (2.5 mg total) by nebulization every 6 (six) hours as needed for wheezing or shortness of breath. 04/06/19   McLean-Scocuzza, Nino Glow, MD  albuterol (VENTOLIN HFA) 108 (90 Base) MCG/ACT inhaler Inhale into the lungs. 01/29/20   [provider]  Albuterol Sulfate (PROAIR RESPICLICK) 643 (90 Base) MCG/ACT AEPB Inhale 1 Inhaler into the lungs every 4 (four) hours as needed. 04/14/19   McLean-Scocuzza, Nino Glow, MD  ALPRAZolam Duanne Moron) 0.25 MG tablet Take 1 tablet (0.25 mg total) by mouth at bedtime as needed for anxiety or sleep. 10/28/19   McLean-Scocuzza, Nino Glow, MD  amLODipine (NORVASC) 10 MG tablet Take 1 tablet (10 mg total) by mouth at bedtime. 06/29/19   McLean-Scocuzza, Nino Glow, MD  aspirin EC 81 MG tablet Take 1 tablet (81 mg total) by mouth daily. 11/04/18   McLean-Scocuzza, Nino Glow, MD  atorvastatin (LIPITOR) 80 MG tablet Take 1 tablet (80 mg total) by mouth daily at 6 PM. 04/06/19 01/31/20  McLean-Scocuzza, Nino Glow, MD  benzonatate (TESSALON PERLES) 100 MG capsule Take 2 capsules (200 mg total) by mouth 3 (three) times daily as needed. 03/02/20 03/02/21  Sable Feil, PA-C  Cholecalciferol 1.25 MG (50000 UT) capsule Take 1 capsule (50,000 Units total) by mouth once a week. 11/16/19   McLean-Scocuzza, Nino Glow, MD  clopidogrel (PLAVIX) 75 MG tablet Take 1 tablet (75 mg total) by mouth daily. 04/06/19   McLean-Scocuzza, Nino Glow, MD  cyclobenzaprine (FLEXERIL) 5 MG tablet Take 1 tablet (5 mg total) by mouth at bedtime as needed for muscle spasms. 01/11/20   McLean-Scocuzza, Nino Glow, MD  diclofenac Sodium (VOLTAREN) 1 % GEL Apply 2 g topically 4 (four) times daily. Prn OA and chest wall pain 10/27/19    McLean-Scocuzza, Nino Glow, MD  dicyclomine (BENTYL) 10 MG capsule TAKE 1 CAPSULE BY MOUTH 4  TIMES DAILY BEFORE MEALS  AND AT BEDTIME 02/01/20   Jonathon Bellows, MD  divalproex (DEPAKOTE) 500 MG DR tablet Take 1 tablet (500 mg total) by mouth 2 (two) times daily. 02/05/20   McLean-Scocuzza, Nino Glow, MD  etodolac (LODINE) 400 MG tablet Take 1 tablet (400 mg total) by mouth 2 (two) times daily. 03/02/20   Sable Feil, PA-C  ezetimibe (ZETIA) 10 MG tablet Take 1 tablet (10 mg total) by mouth daily. With lipitor 80 mg at night 09/05/19   McLean-Scocuzza, Nino Glow, MD  hyoscyamine (LEVSIN) 0.125 MG tablet Take 1 tablet (0.125 mg total) by mouth every 4 (four) hours. As needed 07/20/19 01/31/20  Jonathon Bellows, MD  ipratropium (ATROVENT) 0.06 % nasal spray Place 2 sprays into both nostrils 3 (three) times daily. 01/11/20   McLean-Scocuzza, Nino Glow, MD  ipratropium-albuterol (DUONEB) 0.5-2.5 (3) MG/3ML SOLN Take 3 mLs by nebulization every 6 (six) hours as needed.  12/29/17   McLean-Scocuzza, Nino Glow, MD  isosorbide mononitrate (IMDUR) 30 MG 24 hr tablet Take 1 tablet (30 mg total) by mouth daily. 01/31/20 07/29/20  Loel Dubonnet, NP  levocetirizine (XYZAL) 5 MG tablet Take 1 tablet (5 mg total) by mouth at bedtime as needed for allergies. 01/11/20   McLean-Scocuzza, Nino Glow, MD  lidocaine (LIDODERM) 5 % Place 1 patch onto the skin daily. Remove & Discard patch within 12 hours or as directed by MD 11/16/18   McLean-Scocuzza, Nino Glow, MD  methylPREDNISolone (MEDROL DOSEPAK) 4 MG TBPK tablet Take Tapered dose as directed 03/02/20   Sable Feil, PA-C  nitroGLYCERIN (NITROSTAT) 0.4 MG SL tablet Place 1 tablet (0.4 mg total) under the tongue every 5 (five) minutes as needed for chest pain. X 2 call 911 after 3rd dose 01/31/20   Loel Dubonnet, NP  olmesartan-hydrochlorothiazide (BENICAR HCT) 40-25 MG tablet Take 1 tablet by mouth daily. 02/05/20   McLean-Scocuzza, Nino Glow, MD  ondansetron (ZOFRAN ODT) 4 MG disintegrating  tablet Take 1 tablet (4 mg total) by mouth every 8 (eight) hours as needed for nausea or vomiting. 12/02/19   McLean-Scocuzza, Nino Glow, MD  pantoprazole (PROTONIX) 40 MG tablet TAKE 1 TABLET BY MOUTH  TWICE DAILY BEFORE MEALS 01/31/20   Jonathon Bellows, MD  promethazine (PHENERGAN) 25 MG suppository Place 1 suppository (25 mg total) rectally every 6 (six) hours as needed for nausea. 09/21/19 09/20/20  Delman Kitten, MD  senna-docusate (SENOKOT-S) 8.6-50 MG tablet Take 1-2 tablets by mouth daily as needed for mild constipation. 01/11/20   McLean-Scocuzza, Nino Glow, MD  sodium chloride (OCEAN) 0.65 % SOLN nasal spray Place 2 sprays into both nostrils as needed for congestion. 01/11/20   McLean-Scocuzza, Nino Glow, MD  traMADol (ULTRAM) 50 MG tablet Take 1 tablet (50 mg total) by mouth every 6 (six) hours as needed. 03/02/20 03/02/21  Sable Feil, PA-C  traZODone (DESYREL) 150 MG tablet Take 1 tablet (150 mg total) by mouth at bedtime as needed for sleep. 04/06/19   McLean-Scocuzza, Nino Glow, MD    Allergies Ativan [lorazepam], Latex, Tape, Xifaxan [rifaximin], and Drixoral allergy sinus [dexbromphen-pse-apap er]  Family History  Problem Relation Age of Onset  . CAD Mother   . Depression Mother   . Heart disease Mother   . Hyperlipidemia Mother   . Hypertension Mother   . CAD Brother   . Depression Brother   . Diabetes Brother   . Heart disease Brother   . Hyperlipidemia Brother   . Heart disease Father   . Alcohol abuse Father   . Lupus Other   . Sickle cell anemia Other     Social History Social History   Tobacco Use  . Smoking status: Current Every Day Smoker    Packs/day: 0.50    Years: 30.00    Pack years: 15.00    Types: Cigarettes  . Smokeless tobacco: Never Used  . Tobacco comment: refused  Vaping Use  . Vaping Use: Never used  Substance Use Topics  . Alcohol use: Not Currently    Comment: once a year  . Drug use: Yes    Frequency: 7.0 times per week    Types: Marijuana     Review of Systems Constitutional: No fever/chills Eyes: No visual changes. ENT: No sore throat. Cardiovascular: Denies chest pain. Respiratory: Denies shortness of breath.  Productive cough.  Chest congestion. Gastrointestinal: No abdominal pain.  No nausea, no vomiting.  No diarrhea.  No constipation.  Genitourinary: Negative for dysuria.  Left pelvic pain. Musculoskeletal: Left pelvic and inguinal pain.   Skin: Negative for rash. Neurological: Negative for headaches, focal weakness or numbness. Psychiatric:  Anxiety, bipolar, and depression. Endocrine:  Diabetes, hyperlipidemia, hypertension. Hematological/Lymphatic:  Allergic/Immunilogical: Ativan, latex, Rifaximin, and tape.  ____________________________________________   PHYSICAL EXAM:  VITAL SIGNS: ED Triage Vitals  Enc Vitals Group     BP 03/02/20 1348 (!) 141/70     Pulse Rate 03/02/20 1348 80     Resp 03/02/20 1348 16     Temp 03/02/20 1348 98.9 F (37.2 C)     Temp src --      SpO2 03/02/20 1348 98 %     Weight --      Height --      Head Circumference --      Peak Flow --      Pain Score 03/02/20 1349 7     Pain Loc --      Pain Edu? --      Excl. in [place]? --    Constitutional: Alert and oriented.  Mild distress.   Eyes: Conjunctivae are normal. PERRL. EOMI. Head: Atraumatic. Nose: No congestion/rhinnorhea. Mouth/Throat: Mucous membranes are moist.  Oropharynx non-erythematous. Neck: No stridor.   Cardiovascular: Normal rate, regular rhythm. Grossly normal heart sounds.  Good peripheral circulation. Respiratory: Normal respiratory effort.  No retractions. Lungs CTAB. Gastrointestinal: Soft and nontender. No distention. No abdominal bruits. No CVA tenderness. Genitourinary: Moderate guarding left pelvic area. Musculoskeletal: No lower extremity tenderness nor edema.  Guarding with left hip adduction.   Neurologic:  Normal speech and language. No gross focal neurologic deficits are appreciated. No gait  instability. Skin:  Skin is warm, dry and intact. No rash noted. Psychiatric: Mood and affect are normal. Speech and behavior are normal.  ____________________________________________   LABS (all labs ordered are listed, but only abnormal results are displayed)  Labs Reviewed  URINALYSIS, COMPLETE (UACMP) WITH MICROSCOPIC - Abnormal; Notable for the following components:      Result Value   Color, Urine STRAW (*)    APPearance CLEAR (*)    Hgb urine dipstick SMALL (*)    All other components within normal limits   ____________________________________________  EKG   ____________________________________________  RADIOLOGY  ED MD interpretation:    Official radiology report(s): DG Chest 2 View  Result Date: 03/02/2020 CLINICAL DATA:  Cough for 2 weeks EXAM: CHEST - 2 VIEW COMPARISON:  07/18/2019 FINDINGS: Mild lower central airways thickening. No consolidation or effusion. Normal heart size. No pneumothorax IMPRESSION: Mild lower airways thickening suggesting bronchitis. No focal pulmonary infiltrate. Electronically Signed   By: Donavan Foil M.D.   On: 03/02/2020 17:11   US PELVIS TRANSVAGINAL NON-OB (TV ONLY)  Result Date: 03/02/2020 CLINICAL DATA:  Pelvic pain for 2 weeks EXAM: TRANSABDOMINAL AND TRANSVAGINAL ULTRASOUND OF PELVIS TECHNIQUE: Both transabdominal and transvaginal ultrasound examinations of the pelvis were performed. Transabdominal technique was performed for global imaging of the pelvis including uterus, ovaries, adnexal regions, and pelvic cul-de-sac. It was necessary to proceed with endovaginal exam following the transabdominal exam to visualize the adnexa and ovaries. COMPARISON:  CT 09/21/2019 FINDINGS: Uterus Surgically absent Endometrium Surgically absent Right ovary Not seen Left ovary Not seen Other findings No abnormal free fluid. IMPRESSION: Status post hysterectomy.  Nonvisualized ovaries. Electronically Signed   By: Donavan Foil M.D.   On: 03/02/2020  17:38   US PELVIS (TRANSABDOMINAL ONLY)  Result Date: 03/02/2020 CLINICAL DATA:  Pelvic pain for 2  weeks EXAM: TRANSABDOMINAL AND TRANSVAGINAL ULTRASOUND OF PELVIS TECHNIQUE: Both transabdominal and transvaginal ultrasound examinations of the pelvis were performed. Transabdominal technique was performed for global imaging of the pelvis including uterus, ovaries, adnexal regions, and pelvic cul-de-sac. It was necessary to proceed with endovaginal exam following the transabdominal exam to visualize the adnexa and ovaries. COMPARISON:  CT 09/21/2019 FINDINGS: Uterus Surgically absent Endometrium Surgically absent Right ovary Not seen Left ovary Not seen Other findings No abnormal free fluid. IMPRESSION: Status post hysterectomy.  Nonvisualized ovaries. Electronically Signed   By: Donavan Foil M.D.   On: 03/02/2020 17:38    ____________________________________________   PROCEDURES  Procedure(s) performed (including Critical Care):  Procedures   ____________________________________________   INITIAL IMPRESSION / ASSESSMENT AND PLAN / ED COURSE  As part of my medical decision making, I reviewed the following data within the [place]     Patient presents with productive cough and chest congestion.  Patient also complain of right pelvic/inguinal pain.  Discussed chest x-ray finding consistent with bronchitis.  No acute findings on pelvic ultrasound.  Patient complaint physical exam is consistent with bronchitis and inguinal strain.  Patient given discharge care instruction advised take medication as directed.  Patient advised follow-up with PCP.     Shawna Anderson was evaluated in Emergency Department on 03/02/2020 for the symptoms described in the history of present illness. She was evaluated in the context of the global COVID-19 pandemic, which necessitated consideration that the patient might be at risk for infection with the SARS-CoV-2 virus that causes COVID-19.  Institutional protocols and algorithms that pertain to the evaluation of patients at risk for COVID-19 are in a state of rapid change based on information released by regulatory bodies including the CDC and federal and state organizations. These policies and algorithms were followed during the patient's care in the ED.       ____________________________________________   FINAL CLINICAL IMPRESSION(S) / ED DIAGNOSES  Final diagnoses:  Pelvic pain  Acute bronchitis due to Rhinovirus  Inguinal strain, left, initial encounter     ED Discharge Orders         Ordered    methylPREDNISolone (MEDROL DOSEPAK) 4 MG TBPK tablet        03/02/20 1759    benzonatate (TESSALON PERLES) 100 MG capsule  3 times daily PRN        03/02/20 1759    etodolac (LODINE) 400 MG tablet  2 times daily        03/02/20 1759    traMADol (ULTRAM) 50 MG tablet  Every 6 hours PRN        03/02/20 1759           Note:  This document was prepared using Dragon voice recognition software and may include unintentional dictation errors.    Sable Feil, PA-C 03/02/20 1806    Vladimir Crofts, MD 03/02/20 [phone]

## 2020-03-02 NOTE — Discharge Instructions (Signed)
Follow discharge care instruction take medication as directed. °

## 2020-03-02 NOTE — ED Triage Notes (Signed)
Pt to ED via POV stating that she was sent over from [facility]. Pt states that she has had a cough and congestion for x 2 weeks. Pt states that she was tested for COVID a few days ago and it was negative. Pt states that she is also having pain in her left groin. Pt states that area is painful when she walks and if she touches it. Pt is in NAD.

## 2020-03-02 NOTE — ED Notes (Signed)
Pt reports is here for a couple of things. Pt states that she has a cold and has had it for over 2 weeks and needs the z-pack and some steroids for it.  Pt also reports a pain to her left groin for the past week that is steady. Pt reports she can feel it more when she walks.

## 2020-03-05 ENCOUNTER — Encounter: Payer: Self-pay | Admitting: Internal Medicine

## 2020-03-23 ENCOUNTER — Encounter: Payer: Self-pay | Admitting: Internal Medicine

## 2020-03-23 ENCOUNTER — Telehealth (INDEPENDENT_AMBULATORY_CARE_PROVIDER_SITE_OTHER): Payer: Medicare Other | Admitting: Internal Medicine

## 2020-03-23 VITALS — BP 89/63 | HR 90 | Ht 63.0 in | Wt 179.0 lb

## 2020-03-23 DIAGNOSIS — R110 Nausea: Secondary | ICD-10-CM | POA: Diagnosis not present

## 2020-03-23 DIAGNOSIS — J40 Bronchitis, not specified as acute or chronic: Secondary | ICD-10-CM

## 2020-03-23 DIAGNOSIS — R1012 Left upper quadrant pain: Secondary | ICD-10-CM

## 2020-03-23 DIAGNOSIS — N393 Stress incontinence (female) (male): Secondary | ICD-10-CM

## 2020-03-23 DIAGNOSIS — K3184 Gastroparesis: Secondary | ICD-10-CM

## 2020-03-23 MED ORDER — FLUCONAZOLE 150 MG PO TABS: 150.0000 mg | ORAL_TABLET | Freq: Once | ORAL | 0 refills | Status: AC

## 2020-03-23 MED ORDER — AZITHROMYCIN 250 MG PO TABS: ORAL_TABLET | ORAL | 0 refills | Status: DC

## 2020-03-23 MED ORDER — DM-GUAIFENESIN ER 60-1200 MG PO TB12: 1.0000 | ORAL_TABLET | Freq: Two times a day (BID) | ORAL | 0 refills | Status: DC

## 2020-03-23 MED ORDER — PREDNISONE 20 MG PO TABS: 40.0000 mg | ORAL_TABLET | Freq: Every day | ORAL | 0 refills | Status: DC

## 2020-03-23 NOTE — Progress Notes (Signed)
Virtual Visit via Video Note  I connected with [facility]  on 03/23/20 at  2:55 PM EDT by a video enabled telemedicine application and verified that I am speaking with the correct person using two identifiers.  Location patient: home Location provider:work or home office Persons participating in the virtual visit: patient, provider  I discussed the limitations of evaluation and management by telemedicine and the availability of in person appointments. The patient expressed understanding and agreed to proceed.   HPI: 1. Sick visit felt like had a cold x 1.5 months and CXR 03/02/20 +bronchitis. She does smoke Thc chronically but has stopped x 2 days and she went to ED [facility] 03/02/20 but given tessalon perles and tramadol due to side pain from coughing. She is still coughing and with cough productive at times with phelgm and stress urinary incontinence sx's. She is using proair and nebulizer meds but feels in the past Abx and steroids have helped her with bronchitis   2. Chronic nausea and left upper quadrant ab pain with food and bubbling sensation and cramps with food. Dr. Vicente Males has done EGD/colonoscopy but no etiology found pt agreeable to gastric emptying study to further w/u    ROS: See pertinent positives and negatives per HPI.  Past Medical History:  Diagnosis Date  . Anxiety   . Asthma   . Bipolar 1 disorder ([place])   . Bipolar disorder ([place])   . CAD (coronary artery disease)    s/p stent BMS OM Cx  . Cervical herniated disc 04/12/2016  . COPD (chronic obstructive pulmonary disease) ([place])   . Depression   . Diabetes mellitus without complication ([place])   . Diverticulitis   . GERD (gastroesophageal reflux disease)   . Glaucoma   . History of blood transfusion   . Hyperlipidemia   . Hypertension   . OSA (obstructive sleep apnea)    not using cpap   . Plantar fasciitis    b/l feet s/p steroid shots w/o help and left surgery w/o help   . UTI (urinary tract infection)      Past Surgical History:  Procedure Laterality Date  . ABDOMINAL HYSTERECTOMY    . ABDOMINAL SURGERY  1995   Bowel resection.  Marland Kitchen CARDIAC CATHETERIZATION N/A 04/14/2016   Procedure: Left Heart Cath and Coronary Angiography;  Surgeon: Burnell Blanks, MD;  Location: [place] CV LAB;  Service: Cardiovascular;  Laterality: N/A;  . CARDIAC SURGERY    . COLONOSCOPY WITH PROPOFOL N/A 07/11/2019   Procedure: COLONOSCOPY WITH PROPOFOL;  Surgeon: Jonathon Bellows, MD;  Location: [facility] ENDOSCOPY;  Service: Gastroenterology;  Laterality: N/A;  . COLONOSCOPY WITH PROPOFOL N/A 07/29/2019   Procedure: COLONOSCOPY WITH PROPOFOL;  Surgeon: Jonathon Bellows, MD;  Location: [facility] ENDOSCOPY;  Service: Gastroenterology;  Laterality: N/A;  . CORONARY ANGIOPLASTY WITH STENT PLACEMENT  2010   Drug eluting stent  . ESOPHAGOGASTRODUODENOSCOPY (EGD) WITH PROPOFOL N/A 07/11/2019   Procedure: ESOPHAGOGASTRODUODENOSCOPY (EGD) WITH PROPOFOL;  Surgeon: Jonathon Bellows, MD;  Location: [facility] ENDOSCOPY;  Service: Gastroenterology;  Laterality: N/A;  . OVARIAN CYST REMOVAL      Family History  Problem Relation Age of Onset  . CAD Mother   . Depression Mother   . Heart disease Mother   . Hyperlipidemia Mother   . Hypertension Mother   . CAD Brother   . Depression Brother   . Diabetes Brother   . Heart disease Brother   . Hyperlipidemia Brother   . Heart disease Father   . Alcohol abuse  Father   . Lupus Other   . Sickle cell anemia Other     SOCIAL HX: lives at home   Current Outpatient Medications:  .  albuterol (PROVENTIL) (2.5 MG/3ML) 0.083% nebulizer solution, Take 3 mLs (2.5 mg total) by nebulization every 6 (six) hours as needed for wheezing or shortness of breath., Disp: 360 mL, Rfl: 12 .  Albuterol Sulfate (PROAIR RESPICLICK) 283 (90 Base) MCG/ACT AEPB, Inhale 1 Inhaler into the lungs every 4 (four) hours as needed., Disp: 3 each, Rfl: 4 .  ALPRAZolam (XANAX) 0.25 MG tablet, Take 1 tablet (0.25 mg total) by  mouth at bedtime as needed for anxiety or sleep., Disp: 30 tablet, Rfl: 2 .  amLODipine (NORVASC) 10 MG tablet, Take 1 tablet (10 mg total) by mouth at bedtime., Disp: 90 tablet, Rfl: 3 .  aspirin EC 81 MG tablet, Take 1 tablet (81 mg total) by mouth daily., Disp: 90 tablet, Rfl: 3 .  atorvastatin (LIPITOR) 80 MG tablet, Take 1 tablet (80 mg total) by mouth daily at 6 PM., Disp: 90 tablet, Rfl: 3 .  benzonatate (TESSALON PERLES) 100 MG capsule, Take 2 capsules (200 mg total) by mouth 3 (three) times daily as needed., Disp: 30 capsule, Rfl: 0 .  Cholecalciferol 1.25 MG (50000 UT) capsule, Take 1 capsule (50,000 Units total) by mouth once a week., Disp: 13 capsule, Rfl: 1 .  clopidogrel (PLAVIX) 75 MG tablet, Take 1 tablet (75 mg total) by mouth daily., Disp: 90 tablet, Rfl: 3 .  cyclobenzaprine (FLEXERIL) 5 MG tablet, Take 1 tablet (5 mg total) by mouth at bedtime as needed for muscle spasms., Disp: 90 tablet, Rfl: 3 .  diclofenac Sodium (VOLTAREN) 1 % GEL, Apply 2 g topically 4 (four) times daily. Prn OA and chest wall pain, Disp: 450 g, Rfl: 3 .  dicyclomine (BENTYL) 10 MG capsule, TAKE 1 CAPSULE BY MOUTH 4  TIMES DAILY BEFORE MEALS  AND AT BEDTIME, Disp: 360 capsule, Rfl: 1 .  divalproex (DEPAKOTE) 500 MG DR tablet, Take 1 tablet (500 mg total) by mouth 2 (two) times daily., Disp: 180 tablet, Rfl: 1 .  etodolac (LODINE) 400 MG tablet, Take 1 tablet (400 mg total) by mouth 2 (two) times daily., Disp: 20 tablet, Rfl: 0 .  ezetimibe (ZETIA) 10 MG tablet, Take 1 tablet (10 mg total) by mouth daily. With lipitor 80 mg at night, Disp: 90 tablet, Rfl: 3 .  hyoscyamine (LEVSIN) 0.125 MG tablet, Take 1 tablet (0.125 mg total) by mouth every 4 (four) hours. As needed, Disp: 180 tablet, Rfl: 5 .  ipratropium (ATROVENT) 0.06 % nasal spray, Place 2 sprays into both nostrils 3 (three) times daily., Disp: 45 mL, Rfl: 3 .  ipratropium-albuterol (DUONEB) 0.5-2.5 (3) MG/3ML SOLN, Take 3 mLs by nebulization every 6  (six) hours as needed., Disp: 360 mL, Rfl: 11 .  isosorbide mononitrate (IMDUR) 30 MG 24 hr tablet, Take 1 tablet (30 mg total) by mouth daily., Disp: 90 tablet, Rfl: 1 .  levocetirizine (XYZAL) 5 MG tablet, Take 1 tablet (5 mg total) by mouth at bedtime as needed for allergies., Disp: 90 tablet, Rfl: 3 .  olmesartan-hydrochlorothiazide (BENICAR HCT) 40-25 MG tablet, Take 1 tablet by mouth daily., Disp: 90 tablet, Rfl: 3 .  pantoprazole (PROTONIX) 40 MG tablet, TAKE 1 TABLET BY MOUTH  TWICE DAILY BEFORE MEALS, Disp: 180 tablet, Rfl: 3 .  senna-docusate (SENOKOT-S) 8.6-50 MG tablet, Take 1-2 tablets by mouth daily as needed for mild constipation., Disp: 90  tablet, Rfl: 3 .  sodium chloride (OCEAN) 0.65 % SOLN nasal spray, Place 2 sprays into both nostrils as needed for congestion., Disp: 90 mL, Rfl: 3 .  traZODone (DESYREL) 150 MG tablet, Take 1 tablet (150 mg total) by mouth at bedtime as needed for sleep., Disp: 90 tablet, Rfl: 3 .  albuterol (VENTOLIN HFA) 108 (90 Base) MCG/ACT inhaler, Inhale into the lungs. (Patient not taking: Reported on 03/23/2020), Disp: , Rfl:  .  azithromycin (ZITHROMAX) 250 MG tablet, 2 pill day 1 and 1 pill day 2-5 with food, Disp: 6 tablet, Rfl: 0 .  Dextromethorphan-Guaifenesin 60-1200 MG 12hr tablet, Take 1 tablet by mouth every 12 (twelve) hours., Disp: 40 tablet, Rfl: 0 .  lidocaine (LIDODERM) 5 %, Place 1 patch onto the skin daily. Remove & Discard patch within 12 hours or as directed by MD (Patient not taking: Reported on 03/23/2020), Disp: 30 patch, Rfl: 0 .  nitroGLYCERIN (NITROSTAT) 0.4 MG SL tablet, Place 1 tablet (0.4 mg total) under the tongue every 5 (five) minutes as needed for chest pain. X 2 call 911 after 3rd dose (Patient not taking: Reported on 03/23/2020), Disp: 25 tablet, Rfl: 3 .  ondansetron (ZOFRAN ODT) 4 MG disintegrating tablet, Take 1 tablet (4 mg total) by mouth every 8 (eight) hours as needed for nausea or vomiting. (Patient not taking: Reported on  03/23/2020), Disp: 270 tablet, Rfl: 3 .  predniSONE (DELTASONE) 20 MG tablet, Take 2 tablets (40 mg total) by mouth daily with breakfast., Disp: 14 tablet, Rfl: 0 .  promethazine (PHENERGAN) 25 MG suppository, Place 1 suppository (25 mg total) rectally every 6 (six) hours as needed for nausea. (Patient not taking: Reported on 03/23/2020), Disp: 12 suppository, Rfl: 1 .  traMADol (ULTRAM) 50 MG tablet, Take 1 tablet (50 mg total) by mouth every 6 (six) hours as needed. (Patient not taking: Reported on 03/23/2020), Disp: 20 tablet, Rfl: 0  EXAM:  VITALS per patient if applicable:  GENERAL: alert, oriented, appears well and in no acute distress  HEENT: atraumatic, conjunttiva clear, no obvious abnormalities on inspection of external nose and ears  NECK: normal movements of the head and neck  LUNGS: on inspection no signs of respiratory distress, breathing rate appears normal, no obvious gross SOB, gasping or wheezing  CV: no obvious cyanosis  MS: moves all visible extremities without noticeable abnormality  PSYCH/NEURO: pleasant and cooperative, no obvious depression or anxiety, speech and thought processing grossly intact  ASSESSMENT AND PLAN:  Discussed the following assessment and plan:  Bronchitis - Plan: azithromycin (ZITHROMAX) 250 MG tablet, fluconazole (DIFLUCAN) 150 MG tablet, predniSONE (DELTASONE) 20 MG tablet, Dextromethorphan-Guaifenesin 60-1200 MG 12hr tablet congrats no smoking rec continue cessation   Gastroparesis - Plan: NM Gastric Emptying Chronic nausea ? Due to [facility] use chronically r/o gastroparesis  Left upper quadrant abdominal pain F/u with GI in future Dr. Vicente Males   -we discussed possible serious and likely etiologies, options for evaluation and workup, limitations of telemedicine visit vs in person visit, treatment, treatment risks and precautions. Pt prefers to treat via telemedicine empirically rather then risking or undertaking an in person visit at this moment.   Work/School slipped offered: Advised to seek prompt follow up telemedicine visit or in person care if worsening, new symptoms arise, or if is not improving with treatment. Did let her know that I only do telemedicine on Tuesdays and Thursdays for [place] and advised follow up visit with PCP or [facility] if needs follow up or if any  further questions arise to avoid any delays.   I discussed the assessment and treatment plan with the patient. The patient was provided an opportunity to ask questions and all were answered. The patient agreed with the plan and demonstrated an understanding of the instructions.   The patient was advised to call back or seek an in-person evaluation if the symptoms worsen or if the condition fails to improve as anticipated.  Time spent 30 minutes Delorise Jackson, MD

## 2020-03-23 NOTE — Progress Notes (Signed)
Patient presenting with cold symptoms that were ongoing since last month.   Was seen at [facility] and tested negative for COVID-19. Was diagnosed with bronchitis and given a cough medication and prednisone This did not help.   Symptoms now include coughing and wheezing.

## 2020-03-26 DIAGNOSIS — R110 Nausea: Secondary | ICD-10-CM | POA: Insufficient documentation

## 2020-03-26 DIAGNOSIS — N393 Stress incontinence (female) (male): Secondary | ICD-10-CM | POA: Insufficient documentation

## 2020-03-26 DIAGNOSIS — J40 Bronchitis, not specified as acute or chronic: Secondary | ICD-10-CM | POA: Insufficient documentation

## 2020-03-28 ENCOUNTER — Other Ambulatory Visit: Payer: Self-pay | Admitting: Internal Medicine

## 2020-03-28 DIAGNOSIS — J4520 Mild intermittent asthma, uncomplicated: Secondary | ICD-10-CM

## 2020-03-28 MED ORDER — ALBUTEROL SULFATE HFA 108 (90 BASE) MCG/ACT IN AERS: 1.0000 | INHALATION_SPRAY | Freq: Four times a day (QID) | RESPIRATORY_TRACT | 3 refills | Status: DC | PRN

## 2020-03-29 ENCOUNTER — Other Ambulatory Visit: Payer: Self-pay | Admitting: Family

## 2020-03-29 DIAGNOSIS — I25118 Atherosclerotic heart disease of native coronary artery with other forms of angina pectoris: Secondary | ICD-10-CM

## 2020-04-16 ENCOUNTER — Other Ambulatory Visit: Payer: Self-pay | Admitting: Internal Medicine

## 2020-04-16 DIAGNOSIS — G4700 Insomnia, unspecified: Secondary | ICD-10-CM

## 2020-04-16 DIAGNOSIS — I25118 Atherosclerotic heart disease of native coronary artery with other forms of angina pectoris: Secondary | ICD-10-CM

## 2020-04-16 DIAGNOSIS — I10 Essential (primary) hypertension: Secondary | ICD-10-CM

## 2020-04-16 MED ORDER — AMLODIPINE BESYLATE 10 MG PO TABS: 10.0000 mg | ORAL_TABLET | Freq: Every day | ORAL | 3 refills | Status: DC

## 2020-04-16 MED ORDER — CLOPIDOGREL BISULFATE 75 MG PO TABS: 75.0000 mg | ORAL_TABLET | Freq: Every day | ORAL | 3 refills | Status: DC

## 2020-04-16 MED ORDER — ATORVASTATIN CALCIUM 80 MG PO TABS: 80.0000 mg | ORAL_TABLET | Freq: Every day | ORAL | 3 refills | Status: DC

## 2020-04-16 MED ORDER — TRAZODONE HCL 150 MG PO TABS: 150.0000 mg | ORAL_TABLET | Freq: Every evening | ORAL | 3 refills | Status: DC | PRN

## 2020-04-23 ENCOUNTER — Ambulatory Visit: Payer: Medicare Other | Attending: Internal Medicine

## 2020-04-23 DIAGNOSIS — Z23 Encounter for immunization: Secondary | ICD-10-CM

## 2020-04-23 NOTE — Progress Notes (Signed)
   Covid-19 Vaccination Clinic  Name:  Shawna Anderson    MRN: [id number] DOB: December 22, 1965  04/23/2020  Shawna Anderson was observed post Covid-19 immunization for 15 minutes without incident. She was provided with Vaccine Information Sheet and instruction to access the V-Safe system.   Shawna Anderson was instructed to call 911 with any severe reactions post vaccine: Marland Kitchen Difficulty breathing  . Swelling of face and throat  . A fast heartbeat  . A bad rash all over body  . Dizziness and weakness

## 2020-04-26 ENCOUNTER — Encounter: Payer: Self-pay | Admitting: Internal Medicine

## 2020-05-01 ENCOUNTER — Other Ambulatory Visit: Payer: Medicare Other

## 2020-05-02 ENCOUNTER — Telehealth: Payer: Self-pay | Admitting: Internal Medicine

## 2020-05-02 NOTE — Telephone Encounter (Signed)
lft pt a vm about NM gastric imaging having to be rescheduled.

## 2020-05-04 ENCOUNTER — Other Ambulatory Visit: Payer: Medicare Other

## 2020-05-10 ENCOUNTER — Inpatient Hospital Stay: Payer: Medicare Other

## 2020-05-14 ENCOUNTER — Inpatient Hospital Stay: Payer: Medicare Other

## 2020-05-15 ENCOUNTER — Other Ambulatory Visit: Payer: Self-pay

## 2020-05-15 ENCOUNTER — Telehealth: Payer: Medicare Other | Admitting: Internal Medicine

## 2020-05-15 ENCOUNTER — Telehealth: Payer: Self-pay | Admitting: Internal Medicine

## 2020-05-15 NOTE — Telephone Encounter (Signed)
Left message to return call to start 10:30 video visit.  Link sent

## 2020-05-15 NOTE — Telephone Encounter (Signed)
Left message to return call informing Patient that after 10:45 am appointment will have to be re-scheduled.

## 2020-05-15 NOTE — Telephone Encounter (Signed)
Patient no-showed today's appointment; appointment was for 11/02 at 10:30 am, provider notified for review of record. Mychart message sent for patient to re-schedule.

## 2020-05-16 ENCOUNTER — Other Ambulatory Visit: Payer: Medicare Other

## 2020-05-21 ENCOUNTER — Encounter: Payer: Self-pay | Admitting: Nurse Practitioner

## 2020-05-21 ENCOUNTER — Telehealth (INDEPENDENT_AMBULATORY_CARE_PROVIDER_SITE_OTHER): Payer: Medicare Other | Admitting: Nurse Practitioner

## 2020-05-21 ENCOUNTER — Other Ambulatory Visit: Payer: Self-pay

## 2020-05-21 VITALS — BP 148/96 | HR 92 | Temp 97.5°F | Resp 18 | Ht 63.0 in | Wt 178.0 lb

## 2020-05-21 DIAGNOSIS — J069 Acute upper respiratory infection, unspecified: Secondary | ICD-10-CM

## 2020-05-21 DIAGNOSIS — J40 Bronchitis, not specified as acute or chronic: Secondary | ICD-10-CM | POA: Diagnosis not present

## 2020-05-21 LAB — POCT INFLUENZA A/B
Influenza A, POC: NEGATIVE
Influenza B, POC: NEGATIVE

## 2020-05-21 MED ORDER — AZITHROMYCIN 250 MG PO TABS: ORAL_TABLET | ORAL | 0 refills | Status: DC

## 2020-05-21 MED ORDER — BENZONATATE 100 MG PO CAPS: 200.0000 mg | ORAL_CAPSULE | Freq: Three times a day (TID) | ORAL | 0 refills | Status: AC | PRN

## 2020-05-21 MED ORDER — DEXTROMETHORPHAN-GUAIFENESIN 5-100 MG/5ML PO LIQD: 10.0000 mL | Freq: Every evening | ORAL | 0 refills | Status: DC | PRN

## 2020-05-21 MED ORDER — PREDNISONE 10 MG PO TABS: ORAL_TABLET | ORAL | 0 refills | Status: DC

## 2020-05-21 NOTE — Progress Notes (Signed)
Virtual Visit via Virtual Note  This visit type was conducted due to national recommendations for restrictions regarding the COVID-19 pandemic (e.g. social distancing).  This format is felt to be most appropriate for this patient at this time.  All issues noted in this document were discussed and addressed.  No physical exam was performed (except for noted visual exam findings with Video Visits).   I connected with@ on 05/22/20 at 11:00 AM EST by a video enabled telemedicine application or telephone and verified that I am speaking with the correct person using two identifiers. Location patient: home Location provider: work or home office Persons participating in the virtual visit: patient, provider  I discussed the limitations, risks, security and privacy concerns of performing an evaluation and management service by telephone and the availability of in person appointments. I also discussed with the patient that there may be a patient responsible charge related to this service. The patient expressed understanding and agreed to proceed.   Reason for visit: Cough and flu like symptoms since Friday  HPI: This 54 yo has a history of asthma, COPD, OSA, history of bronchitis, prediabetes reports onset of cough and body aches, slight fever-Tmax 99.8, congestion, blood pressure has been elevated.  She has had wheezing, and is used her inhaler nebulized therapy is not helping.  She is coughing up green to brown phlegm.  She has been taking [facility] 12-hour decongestant, Advil flu with multi symptom tabs and cold and flu tablets.  Nothing has helped.    Today, she has used her DUO NEB nebulizer x 2, Pro Air x 3, had a temperature of 97.5 today.  Pulse ox is 93%.  She does have wheezing, feels little short of breath.  Patient reports this is a typical bronchitis episode for her.  She is requesting prednisone and a Z-Pak.  She reports this usually knocks it out.  She has not been tested for Covid.  She received  Pfizer vaccines and just had her booster on 04/23/2020.  She has had no Covid exposure.  ROS: See pertinent positives and negatives per HPI.  Past Medical History:  Diagnosis Date  . Anxiety   . Asthma   . Bipolar 1 disorder ([place])   . Bipolar disorder ([place])   . CAD (coronary artery disease)    s/p stent BMS OM Cx  . Cervical herniated disc 04/12/2016  . COPD (chronic obstructive pulmonary disease) ([place])   . Depression   . Diabetes mellitus without complication ([place])   . Diverticulitis   . GERD (gastroesophageal reflux disease)   . Glaucoma   . History of blood transfusion   . Hyperlipidemia   . Hypertension   . OSA (obstructive sleep apnea)    not using cpap   . Plantar fasciitis    b/l feet s/p steroid shots w/o help and left surgery w/o help   . UTI (urinary tract infection)     Past Surgical History:  Procedure Laterality Date  . ABDOMINAL HYSTERECTOMY    . ABDOMINAL SURGERY  1995   Bowel resection.  Marland Kitchen CARDIAC CATHETERIZATION N/A 04/14/2016   Procedure: Left Heart Cath and Coronary Angiography;  Surgeon: Burnell Blanks, MD;  Location: [place] CV LAB;  Service: Cardiovascular;  Laterality: N/A;  . CARDIAC SURGERY    . COLONOSCOPY WITH PROPOFOL N/A 07/11/2019   Procedure: COLONOSCOPY WITH PROPOFOL;  Surgeon: Jonathon Bellows, MD;  Location: [facility] ENDOSCOPY;  Service: Gastroenterology;  Laterality: N/A;  . COLONOSCOPY WITH PROPOFOL N/A 07/29/2019  Procedure: COLONOSCOPY WITH PROPOFOL;  Surgeon: Jonathon Bellows, MD;  Location[facility] ENDOSCOPY;  Service: Gastroenterology;  Laterality: N/A;  . CORONARY ANGIOPLASTY WITH STENT PLACEMENT  2010   Drug eluting stent  . ESOPHAGOGASTRODUODENOSCOPY (EGD) WITH PROPOFOL N/A 07/11/2019   Procedure: ESOPHAGOGASTRODUODENOSCOPY (EGD) WITH PROPOFOL;  Surgeon: Jonathon Bellows, MD;  Location: [facility] ENDOSCOPY;  Service: Gastroenterology;  Laterality: N/A;  . OVARIAN CYST REMOVAL      Family History  Problem Relation Age of Onset  . CAD Mother    . Depression Mother   . Heart disease Mother   . Hyperlipidemia Mother   . Hypertension Mother   . CAD Brother   . Depression Brother   . Diabetes Brother   . Heart disease Brother   . Hyperlipidemia Brother   . Heart disease Father   . Alcohol abuse Father   . Lupus Other   . Sickle cell anemia Other     SOCIAL HX: Quit smoking 03/22/20 15 pack years   Current Outpatient Medications:  .  albuterol (PROAIR HFA) 108 (90 Base) MCG/ACT inhaler, Inhale 1-2 puffs into the lungs every 6 (six) hours as needed for wheezing or shortness of breath., Disp: 54 g, Rfl: 3 .  ALPRAZolam (XANAX) 0.25 MG tablet, Take 1 tablet (0.25 mg total) by mouth at bedtime as needed for anxiety or sleep., Disp: 30 tablet, Rfl: 2 .  amLODipine (NORVASC) 10 MG tablet, Take 1 tablet (10 mg total) by mouth at bedtime., Disp: 90 tablet, Rfl: 3 .  aspirin EC 81 MG tablet, Take 1 tablet (81 mg total) by mouth daily., Disp: 90 tablet, Rfl: 3 .  atorvastatin (LIPITOR) 80 MG tablet, Take 1 tablet (80 mg total) by mouth daily at 6 PM., Disp: 90 tablet, Rfl: 3 .  clopidogrel (PLAVIX) 75 MG tablet, Take 1 tablet (75 mg total) by mouth daily., Disp: 90 tablet, Rfl: 3 .  cyclobenzaprine (FLEXERIL) 5 MG tablet, Take 1 tablet (5 mg total) by mouth at bedtime as needed for muscle spasms., Disp: 90 tablet, Rfl: 3 .  diclofenac Sodium (VOLTAREN) 1 % GEL, Apply 2 g topically 4 (four) times daily. Prn OA and chest wall pain, Disp: 450 g, Rfl: 3 .  divalproex (DEPAKOTE) 500 MG DR tablet, Take 1 tablet (500 mg total) by mouth 2 (two) times daily., Disp: 180 tablet, Rfl: 1 .  ezetimibe (ZETIA) 10 MG tablet, Take 1 tablet (10 mg total) by mouth daily. With lipitor 80 mg at night, Disp: 90 tablet, Rfl: 3 .  ipratropium (ATROVENT) 0.06 % nasal spray, Place 2 sprays into both nostrils 3 (three) times daily., Disp: 45 mL, Rfl: 3 .  ipratropium-albuterol (DUONEB) 0.5-2.5 (3) MG/3ML SOLN, Take 3 mLs by nebulization every 6 (six) hours as  needed., Disp: 360 mL, Rfl: 11 .  isosorbide mononitrate (IMDUR) 30 MG 24 hr tablet, Take 1 tablet (30 mg total) by mouth daily., Disp: 90 tablet, Rfl: 1 .  levocetirizine (XYZAL) 5 MG tablet, Take 1 tablet (5 mg total) by mouth at bedtime as needed for allergies., Disp: 90 tablet, Rfl: 3 .  nitroGLYCERIN (NITROSTAT) 0.4 MG SL tablet, DISSOLVE 1 TABLET UNDER THE TONGUE EVERY 5 MINUTES AS  NEEDED FOR CHEST PAIN. MAX  OF 3 TABS IN 15 MINS. CALL  911 AFTER 3RD DOSE, Disp: 25 tablet, Rfl: 0 .  olmesartan-hydrochlorothiazide (BENICAR HCT) 40-25 MG tablet, Take 1 tablet by mouth daily., Disp: 90 tablet, Rfl: 3 .  ondansetron (ZOFRAN ODT) 4 MG disintegrating tablet, Take 1  tablet (4 mg total) by mouth every 8 (eight) hours as needed for nausea or vomiting., Disp: 270 tablet, Rfl: 3 .  pantoprazole (PROTONIX) 40 MG tablet, TAKE 1 TABLET BY MOUTH  TWICE DAILY BEFORE MEALS, Disp: 180 tablet, Rfl: 3 .  promethazine (PHENERGAN) 25 MG suppository, Place 1 suppository (25 mg total) rectally every 6 (six) hours as needed for nausea., Disp: 12 suppository, Rfl: 1 .  senna-docusate (SENOKOT-S) 8.6-50 MG tablet, Take 1-2 tablets by mouth daily as needed for mild constipation., Disp: 90 tablet, Rfl: 3 .  traZODone (DESYREL) 150 MG tablet, Take 1 tablet (150 mg total) by mouth at bedtime as needed for sleep., Disp: 90 tablet, Rfl: 3 .  azithromycin (ZITHROMAX) 250 MG tablet, Take 2 tablets ( total 500 mg) PO on day 1, then take 1 tablet ( total 250 mg) by mouth q24h x 4 days., Disp: 6 tablet, Rfl: 0 .  benzonatate (TESSALON PERLES) 100 MG capsule, Take 2 capsules (200 mg total) by mouth 3 (three) times daily as needed for up to 7 days for cough., Disp: 20 capsule, Rfl: 0 .  Dextromethorphan-guaiFENesin 5-100 MG/5ML LIQD, Take 10 mLs by mouth at bedtime as needed (for cough)., Disp: 70 mL, Rfl: 0 .  predniSONE (DELTASONE) 10 MG tablet, Take 6 tablets ( total 60 mg) by mouth for 1 day; take 5 tablets ( total 50 mg) by mouth  the next day;  and then decrease by 1 tablet ( total 10mg ) every day until off., Disp: 20 tablet, Rfl: 0  EXAM:  VITALS per patient if applicable:97.5-99-148/96 wt 178 lbs  GENERAL: alert, oriented, appears mildly ill  and in no acute distress  HEENT: atraumatic, conjunctiva clear, no obvious abnormalities on inspection of external nose and ears  NECK: normal movements of the head and neck  LUNGS: On inspection no signs of respiratory distress, breathing rate appears normal, no obvious gross SOB, gasping or wheezing. When she cough- wheezy congested cough noted.   CV: no obvious cyanosis  MS: moves all visible extremities without noticeable abnormality  PSYCH/NEURO: pleasant and cooperative, no obvious depression or anxiety, speech and thought processing grossly intact  ASSESSMENT AND PLAN:  Discussed the following assessment and plan:  URI with cough and congestion - Plan: POCT Influenza A/B, Novel Coronavirus, NAA ([facility]), CANCELED: Novel Coronavirus, NAA ([facility])  Bronchitis  No problem-specific Assessment & Plan notes found for this encounter.  Her Oxygen sats are reported at 93% and she has asthma with acute bronchitis. Recent Covid booster- no Covid contacts but will check for Covid and influenza today. She refused to be seen in Acute Care today and wants her usual bronchitis treatment. Renesme promised to go to acute care if symptoms do not improve.    Patient advised:  Patient will come in for influenza and Covid swabs today.   Use the inhaler and DUO NEB as often as prescribed.   Prescriptions for Prednisone taper, Z Pak and Mucinex DM to the pharmacy.   Supportive measures:  Get rest, drink plenty of fluids, and use tylenol or ibuprofen as needed for pain. Follow up if fever >101, if symptoms worsen or if symptoms are not improved in 2 days. Please go to Acute care for an in-person exam. Monitor the pulse oximeter and if the pulse oxygen saturation drop or stay at  90% please go to the emergency room.   I discussed the assessment and treatment plan with the patient. The patient was provided an opportunity to ask questions  and all were answered. The patient agreed with the plan and demonstrated an understanding of the instructions.   The patient was advised to call back or seek an in-person evaluation if the symptoms worsen or if the condition fails to improve as anticipated.  Denice Paradise, NP Adult Nurse Practitioner [place] [phone]

## 2020-05-22 ENCOUNTER — Other Ambulatory Visit: Payer: Self-pay

## 2020-05-22 ENCOUNTER — Encounter: Payer: Self-pay | Admitting: Nurse Practitioner

## 2020-05-22 ENCOUNTER — Emergency Department: Payer: Medicare Other

## 2020-05-22 DIAGNOSIS — Z955 Presence of coronary angioplasty implant and graft: Secondary | ICD-10-CM | POA: Insufficient documentation

## 2020-05-22 DIAGNOSIS — Z87891 Personal history of nicotine dependence: Secondary | ICD-10-CM | POA: Diagnosis not present

## 2020-05-22 DIAGNOSIS — E119 Type 2 diabetes mellitus without complications: Secondary | ICD-10-CM | POA: Diagnosis not present

## 2020-05-22 DIAGNOSIS — J45909 Unspecified asthma, uncomplicated: Secondary | ICD-10-CM | POA: Insufficient documentation

## 2020-05-22 DIAGNOSIS — Z7982 Long term (current) use of aspirin: Secondary | ICD-10-CM | POA: Diagnosis not present

## 2020-05-22 DIAGNOSIS — I10 Essential (primary) hypertension: Secondary | ICD-10-CM | POA: Diagnosis not present

## 2020-05-22 DIAGNOSIS — I25118 Atherosclerotic heart disease of native coronary artery with other forms of angina pectoris: Secondary | ICD-10-CM | POA: Diagnosis not present

## 2020-05-22 DIAGNOSIS — Z7902 Long term (current) use of antithrombotics/antiplatelets: Secondary | ICD-10-CM | POA: Insufficient documentation

## 2020-05-22 DIAGNOSIS — Z79899 Other long term (current) drug therapy: Secondary | ICD-10-CM | POA: Insufficient documentation

## 2020-05-22 DIAGNOSIS — J441 Chronic obstructive pulmonary disease with (acute) exacerbation: Secondary | ICD-10-CM | POA: Insufficient documentation

## 2020-05-22 DIAGNOSIS — R079 Chest pain, unspecified: Secondary | ICD-10-CM | POA: Insufficient documentation

## 2020-05-22 DIAGNOSIS — Z20822 Contact with and (suspected) exposure to covid-19: Secondary | ICD-10-CM | POA: Diagnosis not present

## 2020-05-22 DIAGNOSIS — R0602 Shortness of breath: Secondary | ICD-10-CM | POA: Diagnosis not present

## 2020-05-22 DIAGNOSIS — J9811 Atelectasis: Secondary | ICD-10-CM | POA: Diagnosis not present

## 2020-05-22 LAB — CBC
HCT: 35.8 % — ABNORMAL LOW (ref 36.0–46.0)
Hemoglobin: 12.2 g/dL (ref 12.0–15.0)
MCH: 30.7 pg (ref 26.0–34.0)
MCHC: 34.1 g/dL (ref 30.0–36.0)
MCV: 89.9 fL (ref 80.0–100.0)
Platelets: 463 10*3/uL — ABNORMAL HIGH (ref 150–400)
RBC: 3.98 MIL/uL (ref 3.87–5.11)
RDW: 13.6 % (ref 11.5–15.5)
WBC: 20.4 10*3/uL — ABNORMAL HIGH (ref 4.0–10.5)
nRBC: 0 % (ref 0.0–0.2)

## 2020-05-22 LAB — BASIC METABOLIC PANEL
Anion gap: 13 (ref 5–15)
BUN: 20 mg/dL (ref 6–20)
CO2: 27 mmol/L (ref 22–32)
Calcium: 9.3 mg/dL (ref 8.9–10.3)
Chloride: 97 mmol/L — ABNORMAL LOW (ref 98–111)
Creatinine, Ser: 0.72 mg/dL (ref 0.44–1.00)
GFR, Estimated: >60 mL/min (ref >60)
Glucose, Bld: 133 mg/dL — ABNORMAL HIGH (ref 70–99)
Potassium: 3.8 mmol/L (ref 3.5–5.1)
Sodium: 137 mmol/L (ref 135–145)

## 2020-05-22 LAB — TROPONIN I (HIGH SENSITIVITY)
Troponin I (High Sensitivity): 19 ng/L — ABNORMAL HIGH (ref <18)
Troponin I (High Sensitivity): 15 ng/L (ref <18)

## 2020-05-22 LAB — NOVEL CORONAVIRUS, NAA: SARS-CoV-2, NAA: NOT DETECTED

## 2020-05-22 LAB — SARS-COV-2, NAA 2 DAY TAT

## 2020-05-22 LAB — PROTIME-INR
INR: 0.9 (ref 0.8–1.2)
Prothrombin Time: 12.2 seconds (ref 11.4–15.2)

## 2020-05-22 MED ORDER — IPRATROPIUM-ALBUTEROL 0.5-2.5 (3) MG/3ML IN SOLN
3.0000 mL | Freq: Once | RESPIRATORY_TRACT | Status: AC
  Administered 2020-05-22: 3 mL via RESPIRATORY_TRACT
  Filled 2020-05-22: qty 3

## 2020-05-22 NOTE — ED Triage Notes (Signed)
Pt to ED from [facility] for CP that radiates to bilateral arm, SHOB, low O2 that started on Friday.  Denies N/V SpO2 88-95% in triage. Placed on 2L [place]  States tested neg for COVID and flu yesterday  Expiratory wheezes noted. Pt sounds congested. Tachypnea noted, speaking in complete sentences. NAD noted.

## 2020-05-22 NOTE — Patient Instructions (Addendum)
Patient will come in for influenza and Covid swabs today.   Use the inhaler and DUONEB as often as prescribed.   Prescriptions for Prednisone taper, Z pak and Mucinex DM to the pharmacy.   Supportive measures:  Get rest, drink plenty of fluids, and use tylenol or ibuprofen as needed for pain. Follow up if fever >101, if symptoms worsen or if symptoms are not improved in 2 days. Please go to Acute care for an in-person exam. Monitor the pulse oximeter and if the pulse oxygen saturation drop or stay at 90% please go to the emergency room.    Upper Respiratory Infection, Adult An upper respiratory infection (URI) is a common viral infection of the nose, throat, and upper air passages that lead to the lungs. The most common type of URI is the common cold. URIs usually get better on their own, without medical treatment. What are the causes? A URI is caused by a virus. You may catch a virus by:  Breathing in droplets from an infected person's cough or sneeze.  Touching something that has been exposed to the virus (contaminated) and then touching your mouth, nose, or eyes. What increases the risk? You are more likely to get a URI if:  You are very young or very old.  It is autumn or winter.  You have close contact with others, such as at a daycare, school, or health care facility.  You smoke.  You have long-term (chronic) heart or lung disease.  You have a weakened disease-fighting (immune) system.  You have nasal allergies or asthma.  You are experiencing a lot of stress.  You work in an area that has poor air circulation.  You have poor nutrition. What are the signs or symptoms? A URI usually involves some of the following symptoms:  Runny or stuffy (congested) nose.  Sneezing.  Cough.  Sore throat.  Headache.  Fatigue.  Fever.  Loss of appetite.  Pain in your forehead, behind your eyes, and over your cheekbones (sinus pain).  Muscle aches.  Redness or  irritation of the eyes.  Pressure in the ears or face. How is this diagnosed? This condition may be diagnosed based on your medical history and symptoms, and a physical exam. Your health care provider may use a cotton swab to take a mucus sample from your nose (nasal swab). This sample can be tested to determine what virus is causing the illness. How is this treated? URIs usually get better on their own within 7-10 days. You can take steps at home to relieve your symptoms. Medicines cannot cure URIs, but your health care provider may recommend certain medicines to help relieve symptoms, such as:  Over-the-counter cold medicines.  Cough suppressants. Coughing is a type of defense against infection that helps to clear the respiratory system, so take these medicines only as recommended by your health care provider.  Fever-reducing medicines. Follow these instructions at home: Activity  Rest as needed.  If you have a fever, stay home from work or school until your fever is gone or until your health care provider says you are no longer contagious. Your health care provider may have you wear a face mask to prevent your infection from spreading. Relieving symptoms  Gargle with a salt-water mixture 3-4 times a day or as needed. To make a salt-water mixture, completely dissolve -1 tsp of salt in 1 cup of warm water.  Use a cool-mist humidifier to add moisture to the air. This can help you breathe  more easily. Eating and drinking   Drink enough fluid to keep your urine pale yellow.  Eat soups and other clear broths. General instructions   Take over-the-counter and prescription medicines only as told by your health care provider. These include cold medicines, fever reducers, and cough suppressants.  Do not use any products that contain nicotine or tobacco, such as cigarettes and e-cigarettes. If you need help quitting, ask your health care provider.  Stay away from secondhand  smoke.  Stay up to date on all immunizations, including the yearly (annual) flu vaccine.  Keep all follow-up visits as told by your health care provider. This is important. How to prevent the spread of infection to others   URIs can be passed from person to person (are contagious). To prevent the infection from spreading: ? Wash your hands often with soap and water. If soap and water are not available, use hand sanitizer. ? Avoid touching your mouth, face, eyes, or nose. ? Cough or sneeze into a tissue or your sleeve or elbow instead of into your hand or into the air. Contact a health care provider if:  You are getting worse instead of better.  You have a fever or chills.  Your mucus is brown or red.  You have yellow or brown discharge coming from your nose.  You have pain in your face, especially when you bend forward.  You have swollen neck glands.  You have pain while swallowing.  You have white areas in the back of your throat. Get help right away if:  You have shortness of breath that gets worse.  You have severe or persistent: ? Headache. ? Ear pain. ? Sinus pain. ? Chest pain.  You have chronic lung disease along with any of the following: ? Wheezing. ? Prolonged cough. ? Coughing up blood. ? A change in your usual mucus.  You have a stiff neck.  You have changes in your: ? Vision. ? Hearing. ? Thinking. ? Mood. Summary  An upper respiratory infection (URI) is a common infection of the nose, throat, and upper air passages that lead to the lungs.  A URI is caused by a virus.  URIs usually get better on their own within 7-10 days.  Medicines cannot cure URIs, but your health care provider may recommend certain medicines to help relieve symptoms. This information is not intended to replace advice given to you by your health care provider. Make sure you discuss any questions you have with your health care provider. Document Revised: 07/08/2018 Document  Reviewed: 02/13/2017 Elsevier Patient Education  [place].

## 2020-05-23 ENCOUNTER — Emergency Department
Admission: EM | Admit: 2020-05-23 | Discharge: 2020-05-23 | Disposition: A | Discharge status: Home / Self Care | Payer: Medicare Other | Attending: Emergency Medicine | Admitting: Emergency Medicine

## 2020-05-23 ENCOUNTER — Telehealth: Payer: Self-pay | Admitting: Nurse Practitioner

## 2020-05-23 ENCOUNTER — Encounter (HOSPITAL_COMMUNITY): Payer: Medicare Other

## 2020-05-23 DIAGNOSIS — J441 Chronic obstructive pulmonary disease with (acute) exacerbation: Secondary | ICD-10-CM

## 2020-05-23 LAB — RESPIRATORY PANEL BY RT PCR (FLU A&B, COVID)
Influenza A by PCR: NEGATIVE
Influenza B by PCR: NEGATIVE
SARS Coronavirus 2 by RT PCR: NEGATIVE

## 2020-05-23 LAB — FIBRIN DERIVATIVES D-DIMER (ARMC ONLY): Fibrin derivatives D-dimer (ARMC): 241.75 ng/mL (FEU) (ref 0.00–499.00)

## 2020-05-23 MED ORDER — IPRATROPIUM-ALBUTEROL 0.5-2.5 (3) MG/3ML IN SOLN
3.0000 mL | Freq: Once | RESPIRATORY_TRACT | Status: AC
  Administered 2020-05-23: 3 mL via RESPIRATORY_TRACT
  Filled 2020-05-23: qty 3

## 2020-05-23 NOTE — Telephone Encounter (Addendum)
Please call her for symptom update.  Did she go to walk - in clinic yesterday as advised?    Pt was seen in the ED and treated for COPD flare. Please arrange a follow up video visit with me on Friday.

## 2020-05-23 NOTE — ED Notes (Signed)
Pt ambulatory to restroom at thsio time

## 2020-05-23 NOTE — ED Provider Notes (Signed)
[facility] Emergency Department Provider Note   ____________________________________________   First MD Initiated Contact with Patient 05/23/20 0044     (approximate)  I have reviewed the triage vital signs and the nursing notes.   HISTORY  Chief Complaint Chest Pain and Shortness of Breath    HPI Shawna Anderson is a 54 y.o. female who complains of cough productive of some green to black phlegm.  She was short of breath and wheezing.  She got worse today.  Yesterday she was seen in the video visit and got prednisone and Z-Pak which she is taking.  She also had a negative coronavirus test and flu test yesterday.  She complains of some pleuritic chest pain and had some pain she said in her veins in both arms going up into her chest this afternoon that has since resolved.  Here patient is troponins are negative her EKG looks okay.  She did have some mild blockages on her cardiac cath in 2017.        Past Medical History:  Diagnosis Date  . Anxiety   . Asthma   . Bipolar 1 disorder ([place])   . Bipolar disorder ([place])   . CAD (coronary artery disease)    s/p stent BMS OM Cx  . Cervical herniated disc 04/12/2016  . COPD (chronic obstructive pulmonary disease) ([place])   . Depression   . Diabetes mellitus without complication ([place])   . Diverticulitis   . GERD (gastroesophageal reflux disease)   . Glaucoma   . History of blood transfusion   . Hyperlipidemia   . Hypertension   . OSA (obstructive sleep apnea)    not using cpap   . Plantar fasciitis    b/l feet s/p steroid shots w/o help and left surgery w/o help   . UTI (urinary tract infection)     Patient Active Problem List   Diagnosis Date Noted  . Chronic nausea 03/26/2020  . SUI (stress urinary incontinence, female) 03/26/2020  . Bronchitis 03/26/2020  . Continuous LLQ abdominal pain 03/01/2020  . URI with cough and congestion 03/01/2020  . Esophageal dysphagia 01/26/2020  . Schatzki's  ring of distal esophagus 01/26/2020  . Ecchymosis 01/11/2020  . Chronic pain of both knees 01/11/2020  . Bruising 12/05/2019  . Trichomonas infection 12/05/2019  . Elevated liver enzymes 11/15/2019  . Prediabetes 11/15/2019  . STD exposure 11/15/2019  . Anemia 11/15/2019  . Chronic bursitis of right shoulder 10/25/2019  . Chest wall pain 09/05/2019  . Poor dentition 08/18/2019  . Thrombocytosis 08/18/2019  . Leukocytosis 08/18/2019  . Anal lesion 08/18/2019  . Female pelvic pain 05/26/2019  . Colitis 05/26/2019  . HLD (hyperlipidemia) 12/27/2018  . Vitamin D deficiency 12/24/2018  . Bipolar disorder ([place]) 12/30/2017  . OSA (obstructive sleep apnea) 12/30/2017  . Constipation 12/30/2017  . Coronary artery disease of native artery of native heart with stable angina pectoris ([place]) 12/30/2017  . Gastroesophageal reflux disease 12/30/2017  . Asthma 12/29/2017  . COPD (chronic obstructive pulmonary disease) ([place]) 12/29/2017  . Anxiety and depression 12/29/2017  . Insomnia 12/29/2017  . Chronic back pain 12/29/2017  . Skin lesion of back 12/29/2017  . CAD S/P CFX PCI 2010 04/15/2016  . Essential hypertension 04/15/2016  . Noncompliance with medication regimen 04/15/2016  . Chest pain with moderate risk of acute coronary syndrome 04/12/2016  . Abdominal pain 01/12/2015  . Vomiting 01/12/2015  . Nausea and vomiting 01/12/2015    Past Surgical History:  Procedure Laterality  Date  . ABDOMINAL HYSTERECTOMY    . ABDOMINAL SURGERY  1995   Bowel resection.  Marland Kitchen CARDIAC CATHETERIZATION N/A 04/14/2016   Procedure: Left Heart Cath and Coronary Angiography;  Surgeon: Burnell Blanks, MD;  Location: [place] CV LAB;  Service: Cardiovascular;  Laterality: N/A;  . CARDIAC SURGERY    . COLONOSCOPY WITH PROPOFOL N/A 07/11/2019   Procedure: COLONOSCOPY WITH PROPOFOL;  Surgeon: Jonathon Bellows, MD;  Location: [facility] ENDOSCOPY;  Service: Gastroenterology;  Laterality: N/A;  . COLONOSCOPY WITH  PROPOFOL N/A 07/29/2019   Procedure: COLONOSCOPY WITH PROPOFOL;  Surgeon: Jonathon Bellows, MD;  Location: [facility] ENDOSCOPY;  Service: Gastroenterology;  Laterality: N/A;  . CORONARY ANGIOPLASTY WITH STENT PLACEMENT  2010   Drug eluting stent  . ESOPHAGOGASTRODUODENOSCOPY (EGD) WITH PROPOFOL N/A 07/11/2019   Procedure: ESOPHAGOGASTRODUODENOSCOPY (EGD) WITH PROPOFOL;  Surgeon: Jonathon Bellows, MD;  Location: [facility] ENDOSCOPY;  Service: Gastroenterology;  Laterality: N/A;  . OVARIAN CYST REMOVAL      Prior to Admission medications   Medication Sig Start Date End Date Taking? Authorizing Provider  albuterol (PROAIR HFA) 108 (90 Base) MCG/ACT inhaler Inhale 1-2 puffs into the lungs every 6 (six) hours as needed for wheezing or shortness of breath. 03/28/20   McLean-Scocuzza, Nino Glow, MD  ALPRAZolam Duanne Moron) 0.25 MG tablet Take 1 tablet (0.25 mg total) by mouth at bedtime as needed for anxiety or sleep. 10/28/19   McLean-Scocuzza, Nino Glow, MD  amLODipine (NORVASC) 10 MG tablet Take 1 tablet (10 mg total) by mouth at bedtime. 04/16/20   McLean-Scocuzza, Nino Glow, MD  aspirin EC 81 MG tablet Take 1 tablet (81 mg total) by mouth daily. 11/04/18   McLean-Scocuzza, Nino Glow, MD  atorvastatin (LIPITOR) 80 MG tablet Take 1 tablet (80 mg total) by mouth daily at 6 PM. 04/16/20 07/15/20  McLean-Scocuzza, Nino Glow, MD  azithromycin (ZITHROMAX) 250 MG tablet Take 2 tablets ( total 500 mg) PO on day 1, then take 1 tablet ( total 250 mg) by mouth q24h x 4 days. 05/21/20   Marval Regal, NP  benzonatate (TESSALON PERLES) 100 MG capsule Take 2 capsules (200 mg total) by mouth 3 (three) times daily as needed for up to 7 days for cough. 05/21/20 05/28/20  Marval Regal, NP  clopidogrel (PLAVIX) 75 MG tablet Take 1 tablet (75 mg total) by mouth daily. 04/16/20   McLean-Scocuzza, Nino Glow, MD  cyclobenzaprine (FLEXERIL) 5 MG tablet Take 1 tablet (5 mg total) by mouth at bedtime as needed for muscle spasms. 01/11/20   McLean-Scocuzza, Nino Glow,  MD  Dextromethorphan-guaiFENesin 5-100 MG/5ML LIQD Take 10 mLs by mouth at bedtime as needed (for cough). 05/21/20   Marval Regal, NP  diclofenac Sodium (VOLTAREN) 1 % GEL Apply 2 g topically 4 (four) times daily. Prn OA and chest wall pain 10/27/19   McLean-Scocuzza, Nino Glow, MD  divalproex (DEPAKOTE) 500 MG DR tablet Take 1 tablet (500 mg total) by mouth 2 (two) times daily. 02/05/20   McLean-Scocuzza, Nino Glow, MD  ezetimibe (ZETIA) 10 MG tablet Take 1 tablet (10 mg total) by mouth daily. With lipitor 80 mg at night 09/05/19   McLean-Scocuzza, Nino Glow, MD  ipratropium (ATROVENT) 0.06 % nasal spray Place 2 sprays into both nostrils 3 (three) times daily. 01/11/20   McLean-Scocuzza, Nino Glow, MD  ipratropium-albuterol (DUONEB) 0.5-2.5 (3) MG/3ML SOLN Take 3 mLs by nebulization every 6 (six) hours as needed. 12/29/17   McLean-Scocuzza, Nino Glow, MD  isosorbide mononitrate (IMDUR) 30 MG 24  hr tablet Take 1 tablet (30 mg total) by mouth daily. 01/31/20 07/29/20  Loel Dubonnet, NP  levocetirizine (XYZAL) 5 MG tablet Take 1 tablet (5 mg total) by mouth at bedtime as needed for allergies. 01/11/20   McLean-Scocuzza, Nino Glow, MD  nitroGLYCERIN (NITROSTAT) 0.4 MG SL tablet DISSOLVE 1 TABLET UNDER THE TONGUE EVERY 5 MINUTES AS  NEEDED FOR CHEST PAIN. MAX  OF 3 TABS IN 15 MINS. CALL  911 AFTER 3RD DOSE 03/30/20   Loel Dubonnet, NP  olmesartan-hydrochlorothiazide (BENICAR HCT) 40-25 MG tablet Take 1 tablet by mouth daily. 02/05/20   McLean-Scocuzza, Nino Glow, MD  ondansetron (ZOFRAN ODT) 4 MG disintegrating tablet Take 1 tablet (4 mg total) by mouth every 8 (eight) hours as needed for nausea or vomiting. 12/02/19   McLean-Scocuzza, Nino Glow, MD  pantoprazole (PROTONIX) 40 MG tablet TAKE 1 TABLET BY MOUTH  TWICE DAILY BEFORE MEALS 01/31/20   Jonathon Bellows, MD  predniSONE (DELTASONE) 10 MG tablet Take 6 tablets ( total 60 mg) by mouth for 1 day; take 5 tablets ( total 50 mg) by mouth the next day;  and then decrease by 1  tablet ( total 10mg ) every day until off. 05/21/20   Marval Regal, NP  promethazine (PHENERGAN) 25 MG suppository Place 1 suppository (25 mg total) rectally every 6 (six) hours as needed for nausea. 09/21/19 09/20/20  Delman Kitten, MD  senna-docusate (SENOKOT-S) 8.6-50 MG tablet Take 1-2 tablets by mouth daily as needed for mild constipation. 01/11/20   McLean-Scocuzza, Nino Glow, MD  traZODone (DESYREL) 150 MG tablet Take 1 tablet (150 mg total) by mouth at bedtime as needed for sleep. 04/16/20   McLean-Scocuzza, Nino Glow, MD    Allergies Ativan [lorazepam], Latex, Tape, Xifaxan [rifaximin], and Drixoral allergy sinus [dexbromphen-pse-apap er]  Family History  Problem Relation Age of Onset  . CAD Mother   . Depression Mother   . Heart disease Mother   . Hyperlipidemia Mother   . Hypertension Mother   . CAD Brother   . Depression Brother   . Diabetes Brother   . Heart disease Brother   . Hyperlipidemia Brother   . Heart disease Father   . Alcohol abuse Father   . Lupus Other   . Sickle cell anemia Other     Social History Social History   Tobacco Use  . Smoking status: Former Smoker    Packs/day: 0.50    Years: 30.00    Pack years: 15.00    Types: Cigarettes    Quit date: 03/22/2020    Years since quitting: 0.1  . Smokeless tobacco: Never Used  . Tobacco comment: refused  Vaping Use  . Vaping Use: Never used  Substance Use Topics  . Alcohol use: Not Currently    Comment: once a year  . Drug use: Yes    Frequency: 7.0 times per week    Types: Marijuana    Review of Systems  Constitutional: No fever/chills Eyes: No visual changes. ENT: No sore throat. Cardiovascular: chest pain. Respiratory:  shortness of breath. Gastrointestinal: No abdominal pain.  No nausea, no vomiting.  No diarrhea.  No constipation. Genitourinary: Negative for dysuria. Musculoskeletal: Negative for back pain. Skin: Negative for rash. Neurological: Negative for headaches, focal weakness    ____________________________________________   PHYSICAL EXAM:  VITAL SIGNS: ED Triage Vitals  Enc Vitals Group     BP 05/22/20 1840 (!) 176/86     Pulse Rate 05/22/20 1840 (!) 105  Resp 05/22/20 1840 (!) 22     Temp 05/22/20 1840 98.3 F (36.8 C)     Temp Source 05/22/20 1840 Oral     SpO2 05/22/20 1840 90 %     Weight 05/22/20 1841 176 lb 5.9 oz (80 kg)     Height 05/22/20 1841 5\' 3"  (1.6 m)     Head Circumference --      Peak Flow --      Pain Score 05/22/20 1841 5     Pain Loc --      Pain Edu? --      Excl. in [place]? --     Constitutional: Alert and oriented. Well appearing and in no acute distress. Eyes: Conjunctivae are normal.  Head: Atraumatic. Nose: No congestion/rhinnorhea. Mouth/Throat: Mucous membranes are moist.  Oropharynx non-erythematous. Neck: No stridor.   Cardiovascular: Normal rate, regular rhythm. Grossly normal heart sounds.  Good peripheral circulation. Respiratory: Normal respiratory effort.  No retractions. Lungs diffuse wheezes Gastrointestinal: Soft and nontender. No distention. No abdominal bruits.  Musculoskeletal: No lower extremity tenderness nor edema.   Neurologic:  Normal speech and language. No gross focal neurologic deficits are appreciated. . Skin:  Skin is warm, dry and intact. No rash noted.   ____________________________________________   LABS (all labs ordered are listed, but only abnormal results are displayed)  Labs Reviewed  BASIC METABOLIC PANEL - Abnormal; Notable for the following components:      Result Value   Chloride 97 (*)    Glucose, Bld 133 (*)    All other components within normal limits  CBC - Abnormal; Notable for the following components:   WBC 20.4 (*)    HCT 35.8 (*)    Platelets 463 (*)    All other components within normal limits  TROPONIN I (HIGH SENSITIVITY) - Abnormal; Notable for the following components:   Troponin I (High Sensitivity) 19 (*)    All other components within normal limits   RESPIRATORY PANEL BY RT PCR (FLU A&B, COVID)  PROTIME-INR  FIBRIN DERIVATIVES D-DIMER ([facility] ONLY)  POC URINE PREG, ED  TROPONIN I (HIGH SENSITIVITY)   ____________________________________________  EKG  EKG read interpreted by me shows sinus tachycardia rate of 103 normal axis no acute ST-T wave changes ____________________________________________  RADIOLOGY Gertha Calkin, personally viewed and evaluated these images (plain radiographs) as part of my medical decision making, as well as reviewing the written report by the radiologist.  ED MD interpretation: Chest x-ray read by radiology reviewed by me does not show any obvious infiltrate.  There is some right middle lobe atelectasis as the radiologist noted  Official radiology report(s): DG Chest 2 View  Result Date: 05/22/2020 CLINICAL DATA:  Chest pain and shortness of breath EXAM: CHEST - 2 VIEW COMPARISON:  March 02, 2020 FINDINGS: There is focal atelectatic change in the right middle lobe. No edema or consolidation. There is mild central interstitial thickening without volume loss. Heart size and pulmonary vascular normal. No adenopathy. No bone lesions. IMPRESSION: Evidence of a degree of central bronchitis. Right middle lobe atelectasis. No edema or airspace opacity. Heart size normal. No evident adenopathy. Electronically Signed   By: Lowella Grip III M.D.   On: 05/22/2020 19:21    ____________________________________________   PROCEDURES  Procedure(s) performed (including Critical Care):  Procedures   ____________________________________________   INITIAL IMPRESSION / ASSESSMENT AND PLAN / ED COURSE     ----------------------------------------- 2:45 AM on 05/23/2020 -----------------------------------------  Patient now feeling much better she  still wheezing a little but much less.  Better air movement.  O2 sats are 97% on room air.  She wishes to go home I will let her do this. D-dimer is negative so  unlikely she has a PE.  There is no sign of pneumonia on the chest x-ray.  Her influenza and SARS tests are negative again.  Less likely this is a COPD exacerbation with the patient is doing well enough that she should be able to go home and do well.  She promises to return if she is worse.         ____________________________________________   FINAL CLINICAL IMPRESSION(S) / ED DIAGNOSES  Final diagnoses:  COPD exacerbation [facility])     ED Discharge Orders    None      *Please note:  Shawna Anderson was evaluated in Emergency Department on 05/23/2020 for the symptoms described in the history of present illness. She was evaluated in the context of the global COVID-19 pandemic, which necessitated consideration that the patient might be at risk for infection with the SARS-CoV-2 virus that causes COVID-19. Institutional protocols and algorithms that pertain to the evaluation of patients at risk for COVID-19 are in a state of rapid change based on information released by regulatory bodies including the CDC and federal and state organizations. These policies and algorithms were followed during the patient's care in the ED.  Some ED evaluations and interventions may be delayed as a result of limited staffing during and the pandemic.*   Note:  This document was prepared using Dragon voice recognition software and may include unintentional dictation errors.    Nena Polio, MD 05/23/20 [phone]

## 2020-05-23 NOTE — Telephone Encounter (Signed)
LMTCB

## 2020-05-23 NOTE — Discharge Instructions (Addendum)
Continue to use your antibiotics and steroids and your inhaler or nebulizer.  Please return if you get worse again.  Please follow-up with your regular doctor in about a week.

## 2020-05-24 NOTE — Telephone Encounter (Signed)
Patient is still feeling awful. Still very congested, coughing and SOB. Patient did go to the ED yesterday and was given a breathing treatment; also taking the prescribed meds she was given by you during the VV but nothing is helping. She states she is getting not better. Patient has the proair inhaler and states she thinks she needs a different rescue inhaler because it is not helping her at all. She has a follow up with Dr. Aundra Dubin tomorrow.

## 2020-05-24 NOTE — Telephone Encounter (Signed)
Spoke with patient and advised her to go to the ED if she is feeling worse. She does not think she is worse just not better at all; like none of the medications or breathing treatments are doing anything for her. Patient went to ED yesterday and her o2 was 88% the only thing they did for her there was a breathing treatment which she is also doing at home. She just did one about an hour ago and her o2 is 92%-93% while I was on the phone with her and her pulse was 108. Patient states if theres not another inhaler that can be called in for her then she will just wait until she has her VV with Dr. Aundra Dubin tomorrow. She does not want to go back to the ED when they aren't doing anything different then what she's already doing at home.

## 2020-05-24 NOTE — Telephone Encounter (Signed)
I  reviewed the ED records from yest and am concerned about risk of developing a RML PNA/sinusitis.  She is on Z pak and had Prednisone 10 mg 6 day taper plus DUONEBs and inhalers. You see her tomorrow.   CXR: IMPRESSION: Evidence of a degree of central bronchitis. Right middle lobe atelectasis. No edema or airspace opacity. Heart size normal. No evident adenopathy.

## 2020-05-24 NOTE — Telephone Encounter (Signed)
If she is getting worse- she needs to go back to the ED.

## 2020-05-25 ENCOUNTER — Encounter: Payer: Self-pay | Admitting: Internal Medicine

## 2020-05-25 ENCOUNTER — Ambulatory Visit (INDEPENDENT_AMBULATORY_CARE_PROVIDER_SITE_OTHER): Payer: Medicare Other | Admitting: Internal Medicine

## 2020-05-25 VITALS — Ht 63.0 in | Wt 176.4 lb

## 2020-05-25 DIAGNOSIS — J40 Bronchitis, not specified as acute or chronic: Secondary | ICD-10-CM | POA: Diagnosis not present

## 2020-05-25 DIAGNOSIS — J4521 Mild intermittent asthma with (acute) exacerbation: Secondary | ICD-10-CM | POA: Diagnosis not present

## 2020-05-25 DIAGNOSIS — B3731 Acute candidiasis of vulva and vagina: Secondary | ICD-10-CM

## 2020-05-25 DIAGNOSIS — B373 Candidiasis of vulva and vagina: Secondary | ICD-10-CM | POA: Diagnosis not present

## 2020-05-25 DIAGNOSIS — J441 Chronic obstructive pulmonary disease with (acute) exacerbation: Secondary | ICD-10-CM

## 2020-05-25 MED ORDER — DM-GUAIFENESIN ER 60-1200 MG PO TB12: 1.0000 | ORAL_TABLET | Freq: Two times a day (BID) | ORAL | 0 refills | Status: DC

## 2020-05-25 MED ORDER — LEVOFLOXACIN 750 MG PO TABS: 750.0000 mg | ORAL_TABLET | Freq: Every day | ORAL | 0 refills | Status: DC

## 2020-05-25 MED ORDER — FLUCONAZOLE 150 MG PO TABS: 150.0000 mg | ORAL_TABLET | Freq: Once | ORAL | 0 refills | Status: AC

## 2020-05-25 MED ORDER — COMBIVENT RESPIMAT 20-100 MCG/ACT IN AERS: 1.0000 | INHALATION_SPRAY | Freq: Four times a day (QID) | RESPIRATORY_TRACT | 0 refills | Status: DC | PRN

## 2020-05-25 MED ORDER — COMBIVENT RESPIMAT 20-100 MCG/ACT IN AERS: 1.0000 | INHALATION_SPRAY | Freq: Four times a day (QID) | RESPIRATORY_TRACT | 3 refills | Status: DC | PRN

## 2020-05-25 MED ORDER — DULERA 100-5 MCG/ACT IN AERO: 2.0000 | INHALATION_SPRAY | Freq: Two times a day (BID) | RESPIRATORY_TRACT | 0 refills | Status: DC

## 2020-05-25 MED ORDER — PREDNISONE 20 MG PO TABS: 40.0000 mg | ORAL_TABLET | Freq: Every day | ORAL | 0 refills | Status: DC

## 2020-05-25 NOTE — Telephone Encounter (Signed)
Reason for seeing today

## 2020-05-25 NOTE — Progress Notes (Signed)
telephone Note  I connected with [facility]  on 05/25/20 at  8:50 AM EST telephone and verified that I am speaking with the correct person using two identifiers.  Location patient: grocery store  Location provider:work or home office Persons participating in the virtual visit: patient, provider  I discussed the limitations of evaluation and management by telemedicine and the availability of in person appointments. The patient expressed understanding and agreed to proceed.   HPI: 1. Copd with bronchitis vs asthma flare she recently stopped smoking THC daily. Prednisone taper helped, zpack done today and helped some but not completely and she has albuterol inhaler but not effective, productive cough, sob with exertion O2 sat today with exertion 94% but O2 got down to 99-91% and HR 104 she tried tumeric ginger tea with some relief  ROS: See pertinent positives and negatives per HPI.  Past Medical History:  Diagnosis Date  . Anxiety   . Asthma   . Bipolar 1 disorder ([place])   . Bipolar disorder ([place])   . CAD (coronary artery disease)    s/p stent BMS OM Cx  . Cervical herniated disc 04/12/2016  . COPD (chronic obstructive pulmonary disease) ([place])   . Depression   . Diabetes mellitus without complication ([place])   . Diverticulitis   . GERD (gastroesophageal reflux disease)   . Glaucoma   . History of blood transfusion   . Hyperlipidemia   . Hypertension   . OSA (obstructive sleep apnea)    not using cpap   . Plantar fasciitis    b/l feet s/p steroid shots w/o help and left surgery w/o help   . UTI (urinary tract infection)     Past Surgical History:  Procedure Laterality Date  . ABDOMINAL HYSTERECTOMY    . ABDOMINAL SURGERY  1995   Bowel resection.  Marland Kitchen CARDIAC CATHETERIZATION N/A 04/14/2016   Procedure: Left Heart Cath and Coronary Angiography;  Surgeon: Burnell Blanks, MD;  Location: [place] CV LAB;  Service: Cardiovascular;  Laterality: N/A;  . CARDIAC  SURGERY    . COLONOSCOPY WITH PROPOFOL N/A 07/11/2019   Procedure: COLONOSCOPY WITH PROPOFOL;  Surgeon: Jonathon Bellows, MD;  Location: [facility] ENDOSCOPY;  Service: Gastroenterology;  Laterality: N/A;  . COLONOSCOPY WITH PROPOFOL N/A 07/29/2019   Procedure: COLONOSCOPY WITH PROPOFOL;  Surgeon: Jonathon Bellows, MD;  Location: [facility] ENDOSCOPY;  Service: Gastroenterology;  Laterality: N/A;  . CORONARY ANGIOPLASTY WITH STENT PLACEMENT  2010   Drug eluting stent  . ESOPHAGOGASTRODUODENOSCOPY (EGD) WITH PROPOFOL N/A 07/11/2019   Procedure: ESOPHAGOGASTRODUODENOSCOPY (EGD) WITH PROPOFOL;  Surgeon: Jonathon Bellows, MD;  Location: [facility] ENDOSCOPY;  Service: Gastroenterology;  Laterality: N/A;  . OVARIAN CYST REMOVAL       Current Outpatient Medications:  .  albuterol (PROAIR HFA) 108 (90 Base) MCG/ACT inhaler, Inhale 1-2 puffs into the lungs every 6 (six) hours as needed for wheezing or shortness of breath., Disp: 54 g, Rfl: 3 .  amLODipine (NORVASC) 10 MG tablet, Take 1 tablet (10 mg total) by mouth at bedtime., Disp: 90 tablet, Rfl: 3 .  aspirin EC 81 MG tablet, Take 1 tablet (81 mg total) by mouth daily., Disp: 90 tablet, Rfl: 3 .  atorvastatin (LIPITOR) 80 MG tablet, Take 1 tablet (80 mg total) by mouth daily at 6 PM., Disp: 90 tablet, Rfl: 3 .  azithromycin (ZITHROMAX) 250 MG tablet, Take 2 tablets ( total 500 mg) PO on day 1, then take 1 tablet ( total 250 mg) by mouth q24h x 4 days.,  Disp: 6 tablet, Rfl: 0 .  clopidogrel (PLAVIX) 75 MG tablet, Take 1 tablet (75 mg total) by mouth daily., Disp: 90 tablet, Rfl: 3 .  cyclobenzaprine (FLEXERIL) 5 MG tablet, Take 1 tablet (5 mg total) by mouth at bedtime as needed for muscle spasms., Disp: 90 tablet, Rfl: 3 .  diclofenac Sodium (VOLTAREN) 1 % GEL, Apply 2 g topically 4 (four) times daily. Prn OA and chest wall pain, Disp: 450 g, Rfl: 3 .  divalproex (DEPAKOTE) 500 MG DR tablet, Take 1 tablet (500 mg total) by mouth 2 (two) times daily., Disp: 180 tablet, Rfl: 1 .   ezetimibe (ZETIA) 10 MG tablet, Take 1 tablet (10 mg total) by mouth daily. With lipitor 80 mg at night, Disp: 90 tablet, Rfl: 3 .  ipratropium (ATROVENT) 0.06 % nasal spray, Place 2 sprays into both nostrils 3 (three) times daily., Disp: 45 mL, Rfl: 3 .  ipratropium-albuterol (DUONEB) 0.5-2.5 (3) MG/3ML SOLN, Take 3 mLs by nebulization every 6 (six) hours as needed., Disp: 360 mL, Rfl: 11 .  isosorbide mononitrate (IMDUR) 30 MG 24 hr tablet, Take 1 tablet (30 mg total) by mouth daily., Disp: 90 tablet, Rfl: 1 .  levocetirizine (XYZAL) 5 MG tablet, Take 1 tablet (5 mg total) by mouth at bedtime as needed for allergies., Disp: 90 tablet, Rfl: 3 .  nitroGLYCERIN (NITROSTAT) 0.4 MG SL tablet, DISSOLVE 1 TABLET UNDER THE TONGUE EVERY 5 MINUTES AS  NEEDED FOR CHEST PAIN. MAX  OF 3 TABS IN 15 MINS. CALL  911 AFTER 3RD DOSE, Disp: 25 tablet, Rfl: 0 .  olmesartan-hydrochlorothiazide (BENICAR HCT) 40-25 MG tablet, Take 1 tablet by mouth daily., Disp: 90 tablet, Rfl: 3 .  ondansetron (ZOFRAN ODT) 4 MG disintegrating tablet, Take 1 tablet (4 mg total) by mouth every 8 (eight) hours as needed for nausea or vomiting., Disp: 270 tablet, Rfl: 3 .  pantoprazole (PROTONIX) 40 MG tablet, TAKE 1 TABLET BY MOUTH  TWICE DAILY BEFORE MEALS, Disp: 180 tablet, Rfl: 3 .  predniSONE (DELTASONE) 10 MG tablet, Take 6 tablets ( total 60 mg) by mouth for 1 day; take 5 tablets ( total 50 mg) by mouth the next day;  and then decrease by 1 tablet ( total 10mg ) every day until off., Disp: 20 tablet, Rfl: 0 .  traZODone (DESYREL) 150 MG tablet, Take 1 tablet (150 mg total) by mouth at bedtime as needed for sleep., Disp: 90 tablet, Rfl: 3 .  ALPRAZolam (XANAX) 0.25 MG tablet, Take 1 tablet (0.25 mg total) by mouth at bedtime as needed for anxiety or sleep. (Patient not taking: Reported on 05/25/2020), Disp: 30 tablet, Rfl: 2 .  benzonatate (TESSALON PERLES) 100 MG capsule, Take 2 capsules (200 mg total) by mouth 3 (three) times daily as  needed for up to 7 days for cough. (Patient not taking: Reported on 05/25/2020), Disp: 20 capsule, Rfl: 0 .  Dextromethorphan-Guaifenesin 60-1200 MG 12hr tablet, Take 1 tablet by mouth every 12 (twelve) hours. Cough prn, Disp: 30 tablet, Rfl: 0 .  fluconazole (DIFLUCAN) 150 MG tablet, Take 1 tablet (150 mg total) by mouth once for 1 dose., Disp: 1 tablet, Rfl: 0 .  Ipratropium-Albuterol (COMBIVENT RESPIMAT) 20-100 MCG/ACT AERS respimat, Inhale 1 puff into the lungs every 6 (six) hours as needed for wheezing., Disp: 4 g, Rfl: 0 .  Ipratropium-Albuterol (COMBIVENT RESPIMAT) 20-100 MCG/ACT AERS respimat, Inhale 1 puff into the lungs every 6 (six) hours as needed for wheezing., Disp: 12 g, Rfl: 3 .  levofloxacin (LEVAQUIN) 750 MG tablet, Take 1 tablet (750 mg total) by mouth daily. With food, Disp: 5 tablet, Rfl: 0 .  mometasone-formoterol (DULERA) 100-5 MCG/ACT AERO, Inhale 2 puffs into the lungs in the morning and at bedtime. Rinse mouth, Disp: 1 each, Rfl: 0 .  predniSONE (DELTASONE) 20 MG tablet, Take 2 tablets (40 mg total) by mouth daily with breakfast. X 7 days then 1 pill for 3 days then stop., Disp: 31 tablet, Rfl: 0 .  promethazine (PHENERGAN) 25 MG suppository, Place 1 suppository (25 mg total) rectally every 6 (six) hours as needed for nausea. (Patient not taking: Reported on 05/25/2020), Disp: 12 suppository, Rfl: 1 .  senna-docusate (SENOKOT-S) 8.6-50 MG tablet, Take 1-2 tablets by mouth daily as needed for mild constipation. (Patient not taking: Reported on 05/25/2020), Disp: 90 tablet, Rfl: 3  EXAM:  VITALS per patient if applicable:  GENERAL: alert, oriented, appears well and in no acute distress  LUNGS: no gasping or wheezing continuously cough   PSYCH/NEURO: pleasant and cooperative, no obvious depression or anxiety, speech and thought processing grossly intact  ASSESSMENT AND PLAN:  Discussed the following assessment and plan:  Bronchitis ? Related COPD vs asthma - Plan:  Ipratropium-Albuterol (COMBIVENT RESPIMAT) 20-100 MCG/ACT AERS respimat, predniSONE (DELTASONE) 20 MG tablet, Dextromethorphan-Guaifenesin 60-1200 MG 12hr tablet, levofloxacin (LEVAQUIN) 750 MG tablet, mometasone-formoterol (DULERA) 100-5 MCG/ACT AERO, Ipratropium-Albuterol (COMBIVENT RESPIMAT) 20-100 MCG/ACT AERS respimat, Ambulatory referral to Pulmonology  Mild intermittent asthma with acute exacerbation - Plan: Ipratropium-Albuterol (COMBIVENT RESPIMAT) 20-100 MCG/ACT AERS respimat, predniSONE (DELTASONE) 20 MG tablet, Dextromethorphan-Guaifenesin 60-1200 MG 12hr tablet, levofloxacin (LEVAQUIN) 750 MG tablet, mometasone-formoterol (DULERA) 100-5 MCG/ACT AERO, Ipratropium-Albuterol (COMBIVENT RESPIMAT) 20-100 MCG/ACT AERS respimat, Ambulatory referral to Pulmonology  COPD exacerbation ([place]) - Plan: Ipratropium-Albuterol (COMBIVENT RESPIMAT) 20-100 MCG/ACT AERS respimat, predniSONE (DELTASONE) 20 MG tablet, Dextromethorphan-Guaifenesin 60-1200 MG 12hr tablet, levofloxacin (LEVAQUIN) 750 MG tablet, mometasone-formoterol (DULERA) 100-5 MCG/ACT AERO, Ipratropium-Albuterol (COMBIVENT RESPIMAT) 20-100 MCG/ACT AERS respimat, Ambulatory referral to Pulmonology  Yeast vaginitis - Plan: fluconazole (DIFLUCAN) 150 MG tablet  -we discussed possible serious and likely etiologies, options for evaluation and workup, limitations of telemedicine visit vs in person visit, treatment, treatment risks and precautions.   I discussed the assessment and treatment plan with the patient. The patient was provided an opportunity to ask questions and all were answered. The patient agreed with the plan and demonstrated an understanding of the instructions.    Time spent 30 minutes Delorise Jackson, MD

## 2020-05-28 ENCOUNTER — Inpatient Hospital Stay: Payer: Medicare Other | Attending: Oncology

## 2020-05-31 ENCOUNTER — Other Ambulatory Visit: Payer: Self-pay | Admitting: Family

## 2020-06-01 ENCOUNTER — Ambulatory Visit (HOSPITAL_COMMUNITY): Payer: Medicare Other

## 2020-06-06 ENCOUNTER — Ambulatory Visit: Payer: Medicare Other | Admitting: Internal Medicine

## 2020-06-09 ENCOUNTER — Encounter: Payer: Self-pay | Admitting: Internal Medicine

## 2020-06-11 NOTE — Telephone Encounter (Signed)
I can see her and band hemorroids if needed- set up office visit

## 2020-06-29 ENCOUNTER — Other Ambulatory Visit: Payer: Medicare Other

## 2020-07-16 ENCOUNTER — Emergency Department
Admission: EM | Admit: 2020-07-16 | Discharge: 2020-07-16 | Disposition: A | Discharge status: Home / Self Care | Payer: Medicare Other | Attending: Emergency Medicine | Admitting: Emergency Medicine

## 2020-07-16 ENCOUNTER — Other Ambulatory Visit: Payer: Self-pay

## 2020-07-16 ENCOUNTER — Telehealth: Payer: Self-pay | Admitting: Internal Medicine

## 2020-07-16 ENCOUNTER — Encounter: Payer: Self-pay | Admitting: Emergency Medicine

## 2020-07-16 DIAGNOSIS — E119 Type 2 diabetes mellitus without complications: Secondary | ICD-10-CM | POA: Insufficient documentation

## 2020-07-16 DIAGNOSIS — Z955 Presence of coronary angioplasty implant and graft: Secondary | ICD-10-CM | POA: Insufficient documentation

## 2020-07-16 DIAGNOSIS — I25110 Atherosclerotic heart disease of native coronary artery with unstable angina pectoris: Secondary | ICD-10-CM | POA: Diagnosis not present

## 2020-07-16 DIAGNOSIS — Z79899 Other long term (current) drug therapy: Secondary | ICD-10-CM | POA: Insufficient documentation

## 2020-07-16 DIAGNOSIS — Z7982 Long term (current) use of aspirin: Secondary | ICD-10-CM | POA: Insufficient documentation

## 2020-07-16 DIAGNOSIS — Z91040 Latex allergy status: Secondary | ICD-10-CM | POA: Diagnosis not present

## 2020-07-16 DIAGNOSIS — Z20822 Contact with and (suspected) exposure to covid-19: Secondary | ICD-10-CM | POA: Diagnosis not present

## 2020-07-16 DIAGNOSIS — Z87891 Personal history of nicotine dependence: Secondary | ICD-10-CM | POA: Diagnosis not present

## 2020-07-16 DIAGNOSIS — J029 Acute pharyngitis, unspecified: Secondary | ICD-10-CM

## 2020-07-16 DIAGNOSIS — Z7901 Long term (current) use of anticoagulants: Secondary | ICD-10-CM | POA: Insufficient documentation

## 2020-07-16 DIAGNOSIS — I10 Essential (primary) hypertension: Secondary | ICD-10-CM | POA: Diagnosis not present

## 2020-07-16 DIAGNOSIS — J45909 Unspecified asthma, uncomplicated: Secondary | ICD-10-CM | POA: Diagnosis not present

## 2020-07-16 DIAGNOSIS — J449 Chronic obstructive pulmonary disease, unspecified: Secondary | ICD-10-CM | POA: Diagnosis not present

## 2020-07-16 LAB — GROUP A STREP BY PCR: Group A Strep by PCR: NOT DETECTED

## 2020-07-16 LAB — SARS CORONAVIRUS 2 (TAT 6-24 HRS): SARS Coronavirus 2: NEGATIVE

## 2020-07-16 MED ORDER — DEXAMETHASONE SODIUM PHOSPHATE 10 MG/ML IJ SOLN
10.0000 mg | Freq: Once | INTRAMUSCULAR | Status: AC
  Administered 2020-07-16: 10 mg via INTRAMUSCULAR
  Filled 2020-07-16: qty 1

## 2020-07-16 MED ORDER — HYDROCOD POLST-CPM POLST ER 10-8 MG/5ML PO SUER: 5.0000 mL | Freq: Two times a day (BID) | ORAL | 0 refills | Status: DC

## 2020-07-16 MED ORDER — HYDROCOD POLST-CPM POLST ER 10-8 MG/5ML PO SUER
5.0000 mL | Freq: Once | ORAL | Status: AC
  Administered 2020-07-16: 5 mL via ORAL
  Filled 2020-07-16: qty 5

## 2020-07-16 MED ORDER — DIPHENHYDRAMINE HCL 12.5 MG/5ML PO ELIX
12.5000 mg | ORAL_SOLUTION | Freq: Once | ORAL | Status: AC
  Administered 2020-07-16: 12.5 mg via ORAL
  Filled 2020-07-16: qty 5

## 2020-07-16 MED ORDER — LIDOCAINE VISCOUS HCL 2 % MT SOLN: 5.0000 mL | Freq: Four times a day (QID) | OROMUCOSAL | 0 refills | Status: DC | PRN

## 2020-07-16 MED ORDER — LIDOCAINE VISCOUS HCL 2 % MT SOLN
15.0000 mL | Freq: Once | OROMUCOSAL | Status: AC
  Administered 2020-07-16: 15 mL via OROMUCOSAL
  Filled 2020-07-16: qty 15

## 2020-07-16 NOTE — ED Notes (Signed)
Pt refused to wait 20 min pot IM injection stating she has had it before.  Pt left facility ambulatory and in NAD.

## 2020-07-16 NOTE — ED Notes (Signed)
Pt crying asking for ice chips, stating, "it hurts" Tech gave pt a cup of ice chips to help with sore throat

## 2020-07-16 NOTE — ED Triage Notes (Signed)
Patient to ER for c/o sore throat and bilateral ear pain x3 days. +Body aches. Denies any known fevers. Reports friend tested positive for Covid, had negative test with at home kit.

## 2020-07-16 NOTE — Discharge Instructions (Signed)
Your test was negative for strep pharyngitis.  Your COVID-19 test is pending and results can be found in the MyChart app later today.  Advised self quarantine pending results.  Advised to consider getting the booster shot.  Take medications as directed.

## 2020-07-16 NOTE — ED Provider Notes (Signed)
[facility] Emergency Department Provider Note   ____________________________________________   Event Date/Time   First MD Initiated Contact with Patient 07/16/20 [phone]     (approximate)  I have reviewed the triage vital signs and the nursing notes.   HISTORY  Chief Complaint Sore Throat and Otalgia    HPI Shawna Anderson is a 55 y.o. female patient presents with sore throat, bilateral ear pain, and body aches.  Patient denies fever associated complaint.  Patient states friend tested positive for COVID-19.  Patient states she completed 2 doses of the COVID-19 vaccine.  Patient is not taking the booster.  Patient  had a negative test at home.         Past Medical History:  Diagnosis Date  . Anxiety   . Asthma   . Bipolar 1 disorder (HCC)   . Bipolar disorder (HCC)   . CAD (coronary artery disease)    s/p stent BMS OM Cx  . Cervical herniated disc 04/12/2016  . COPD (chronic obstructive pulmonary disease) (HCC)   . Depression   . Diabetes mellitus without complication (HCC)   . Diverticulitis   . GERD (gastroesophageal reflux disease)   . Glaucoma   . History of blood transfusion   . Hyperlipidemia   . Hypertension   . OSA (obstructive sleep apnea)    not using cpap   . Plantar fasciitis    b/l feet s/p steroid shots w/o help and left surgery w/o help   . UTI (urinary tract infection)     Patient Active Problem List   Diagnosis Date Noted  . Chronic nausea 03/26/2020  . SUI (stress urinary incontinence, female) 03/26/2020  . Bronchitis 03/26/2020  . Continuous LLQ abdominal pain 03/01/2020  . URI with cough and congestion 03/01/2020  . Esophageal dysphagia 01/26/2020  . Schatzki's ring of distal esophagus 01/26/2020  . Ecchymosis 01/11/2020  . Chronic pain of both knees 01/11/2020  . Bruising 12/05/2019  . Trichomonas infection 12/05/2019  . Elevated liver enzymes 11/15/2019  . Prediabetes 11/15/2019  . STD exposure  11/15/2019  . Anemia 11/15/2019  . Chronic bursitis of right shoulder 10/25/2019  . Chest wall pain 09/05/2019  . Poor dentition 08/18/2019  . Thrombocytosis 08/18/2019  . Leukocytosis 08/18/2019  . Anal lesion 08/18/2019  . Female pelvic pain 05/26/2019  . Colitis 05/26/2019  . HLD (hyperlipidemia) 12/27/2018  . Vitamin D deficiency 12/24/2018  . Bipolar disorder (HCC) 12/30/2017  . OSA (obstructive sleep apnea) 12/30/2017  . Constipation 12/30/2017  . Coronary artery disease of native artery of native heart with stable angina pectoris (HCC) 12/30/2017  . Gastroesophageal reflux disease 12/30/2017  . Asthma 12/29/2017  . COPD (chronic obstructive pulmonary disease) (HCC) 12/29/2017  . Anxiety and depression 12/29/2017  . Insomnia 12/29/2017  . Chronic back pain 12/29/2017  . Skin lesion of back 12/29/2017  . CAD S/P CFX PCI 2010 04/15/2016  . Essential hypertension 04/15/2016  . Noncompliance with medication regimen 04/15/2016  . Chest pain with moderate risk of acute coronary syndrome 04/12/2016  . Abdominal pain 01/12/2015  . Vomiting 01/12/2015  . Nausea and vomiting 01/12/2015    Past Surgical History:  Procedure Laterality Date  . ABDOMINAL HYSTERECTOMY    . ABDOMINAL SURGERY  1995   Bowel resection.  Marland Kitchen CARDIAC CATHETERIZATION N/A 04/14/2016   Procedure: Left Heart Cath and Coronary Angiography;  Surgeon: Kathleene Hazel, MD;  Location: [facility] INVASIVE CV LAB;  Service: Cardiovascular;  Laterality: N/A;  . CARDIAC SURGERY    .  COLONOSCOPY WITH PROPOFOL N/A 07/11/2019   Procedure: COLONOSCOPY WITH PROPOFOL;  Surgeon: Jonathon Bellows, MD;  Location: [facility] ENDOSCOPY;  Service: Gastroenterology;  Laterality: N/A;  . COLONOSCOPY WITH PROPOFOL N/A 07/29/2019   Procedure: COLONOSCOPY WITH PROPOFOL;  Surgeon: Jonathon Bellows, MD;  Location: [facility] ENDOSCOPY;  Service: Gastroenterology;  Laterality: N/A;  . CORONARY ANGIOPLASTY WITH STENT PLACEMENT  2010   Drug eluting stent  .  ESOPHAGOGASTRODUODENOSCOPY (EGD) WITH PROPOFOL N/A 07/11/2019   Procedure: ESOPHAGOGASTRODUODENOSCOPY (EGD) WITH PROPOFOL;  Surgeon: Jonathon Bellows, MD;  Location: [facility] ENDOSCOPY;  Service: Gastroenterology;  Laterality: N/A;  . OVARIAN CYST REMOVAL      Prior to Admission medications   Medication Sig Start Date End Date Taking? Authorizing Provider  chlorpheniramine-HYDROcodone (TUSSIONEX PENNKINETIC ER) 10-8 MG/5ML SUER Take 5 mLs by mouth 2 (two) times daily. 07/16/20  Yes Sable Feil, PA-C  lidocaine (XYLOCAINE) 2 % solution Use as directed 5 mLs in the mouth or throat every 6 (six) hours as needed for mouth pain. For oral swish and swallow. 07/16/20  Yes Sable Feil, PA-C  albuterol [facility] HFA) 108 (90 Base) MCG/ACT inhaler Inhale 1-2 puffs into the lungs every 6 (six) hours as needed for wheezing or shortness of breath. 03/28/20   McLean-Scocuzza, Nino Glow, MD  ALPRAZolam Duanne Moron) 0.25 MG tablet Take 1 tablet (0.25 mg total) by mouth at bedtime as needed for anxiety or sleep. Patient not taking: Reported on 05/25/2020 10/28/19   McLean-Scocuzza, Nino Glow, MD  amLODipine (NORVASC) 10 MG tablet Take 1 tablet (10 mg total) by mouth at bedtime. 04/16/20   McLean-Scocuzza, Nino Glow, MD  aspirin EC 81 MG tablet Take 1 tablet (81 mg total) by mouth daily. 11/04/18   McLean-Scocuzza, Nino Glow, MD  atorvastatin (LIPITOR) 80 MG tablet Take 1 tablet (80 mg total) by mouth daily at 6 PM. 04/16/20 07/15/20  McLean-Scocuzza, Nino Glow, MD  azithromycin (ZITHROMAX) 250 MG tablet Take 2 tablets ( total 500 mg) PO on day 1, then take 1 tablet ( total 250 mg) by mouth q24h x 4 days. 05/21/20   Marval Regal, NP  clopidogrel (PLAVIX) 75 MG tablet Take 1 tablet (75 mg total) by mouth daily. 04/16/20   McLean-Scocuzza, Nino Glow, MD  cyclobenzaprine (FLEXERIL) 5 MG tablet Take 1 tablet (5 mg total) by mouth at bedtime as needed for muscle spasms. 01/11/20   McLean-Scocuzza, Nino Glow, MD  Dextromethorphan-Guaifenesin 60-1200 MG  12hr tablet Take 1 tablet by mouth every 12 (twelve) hours. Cough prn 05/25/20   McLean-Scocuzza, Nino Glow, MD  diclofenac Sodium (VOLTAREN) 1 % GEL Apply 2 g topically 4 (four) times daily. Prn OA and chest wall pain 10/27/19   McLean-Scocuzza, Nino Glow, MD  divalproex (DEPAKOTE) 500 MG DR tablet Take 1 tablet (500 mg total) by mouth 2 (two) times daily. 02/05/20   McLean-Scocuzza, Nino Glow, MD  ezetimibe (ZETIA) 10 MG tablet Take 1 tablet (10 mg total) by mouth daily. With lipitor 80 mg at night 09/05/19   McLean-Scocuzza, Nino Glow, MD  ipratropium (ATROVENT) 0.06 % nasal spray Place 2 sprays into both nostrils 3 (three) times daily. 01/11/20   McLean-Scocuzza, Nino Glow, MD  Ipratropium-Albuterol (COMBIVENT RESPIMAT) 20-100 MCG/ACT AERS respimat Inhale 1 puff into the lungs every 6 (six) hours as needed for wheezing. 05/25/20   McLean-Scocuzza, Nino Glow, MD  Ipratropium-Albuterol (COMBIVENT RESPIMAT) 20-100 MCG/ACT AERS respimat Inhale 1 puff into the lungs every 6 (six) hours as needed for wheezing. 05/25/20   McLean-Scocuzza, Nino Glow,  MD  ipratropium-albuterol (DUONEB) 0.5-2.5 (3) MG/3ML SOLN Take 3 mLs by nebulization every 6 (six) hours as needed. 12/29/17   McLean-Scocuzza, Nino Glow, MD  isosorbide mononitrate (IMDUR) 30 MG 24 hr tablet TAKE 1 TABLET BY MOUTH  DAILY 06/01/20   Loel Dubonnet, NP  levocetirizine (XYZAL) 5 MG tablet Take 1 tablet (5 mg total) by mouth at bedtime as needed for allergies. 01/11/20   McLean-Scocuzza, Nino Glow, MD  levofloxacin (LEVAQUIN) 750 MG tablet Take 1 tablet (750 mg total) by mouth daily. With food 05/25/20   McLean-Scocuzza, Nino Glow, MD  mometasone-formoterol (DULERA) 100-5 MCG/ACT AERO Inhale 2 puffs into the lungs in the morning and at bedtime. Rinse mouth 05/25/20   McLean-Scocuzza, Nino Glow, MD  nitroGLYCERIN (NITROSTAT) 0.4 MG SL tablet DISSOLVE 1 TABLET UNDER THE TONGUE EVERY 5 MINUTES AS  NEEDED FOR CHEST PAIN. MAX  OF 3 TABS IN 15 MINS. CALL  911 AFTER 3RD DOSE  03/30/20   Loel Dubonnet, NP  olmesartan-hydrochlorothiazide (BENICAR HCT) 40-25 MG tablet Take 1 tablet by mouth daily. 02/05/20   McLean-Scocuzza, Nino Glow, MD  ondansetron (ZOFRAN ODT) 4 MG disintegrating tablet Take 1 tablet (4 mg total) by mouth every 8 (eight) hours as needed for nausea or vomiting. 12/02/19   McLean-Scocuzza, Nino Glow, MD  pantoprazole (PROTONIX) 40 MG tablet TAKE 1 TABLET BY MOUTH  TWICE DAILY BEFORE MEALS 01/31/20   Jonathon Bellows, MD  predniSONE (DELTASONE) 10 MG tablet Take 6 tablets ( total 60 mg) by mouth for 1 day; take 5 tablets ( total 50 mg) by mouth the next day;  and then decrease by 1 tablet ( total 10mg ) every day until off. 05/21/20   Marval Regal, NP  predniSONE (DELTASONE) 20 MG tablet Take 2 tablets (40 mg total) by mouth daily with breakfast. X 7 days then 1 pill for 3 days then stop. 05/25/20   McLean-Scocuzza, Nino Glow, MD  promethazine (PHENERGAN) 25 MG suppository Place 1 suppository (25 mg total) rectally every 6 (six) hours as needed for nausea. Patient not taking: Reported on 05/25/2020 09/21/19 09/20/20  Delman Kitten, MD  senna-docusate (SENOKOT-S) 8.6-50 MG tablet Take 1-2 tablets by mouth daily as needed for mild constipation. Patient not taking: Reported on 05/25/2020 01/11/20   McLean-Scocuzza, Nino Glow, MD  traZODone (DESYREL) 150 MG tablet Take 1 tablet (150 mg total) by mouth at bedtime as needed for sleep. 04/16/20   McLean-Scocuzza, Nino Glow, MD    Allergies Ativan [lorazepam], Latex, Tape, Xifaxan [rifaximin], and Drixoral allergy sinus [dexbromphen-pse-apap er]  Family History  Problem Relation Age of Onset  . CAD Mother   . Depression Mother   . Heart disease Mother   . Hyperlipidemia Mother   . Hypertension Mother   . CAD Brother   . Depression Brother   . Diabetes Brother   . Heart disease Brother   . Hyperlipidemia Brother   . Heart disease Father   . Alcohol abuse Father   . Lupus Other   . Sickle cell anemia Other     Social  History Social History   Tobacco Use  . Smoking status: Former Smoker    Packs/day: 0.50    Years: 30.00    Pack years: 15.00    Types: Cigarettes    Quit date: 03/22/2020    Years since quitting: 0.3  . Smokeless tobacco: Never Used  . Tobacco comment: refused  Vaping Use  . Vaping Use: Never used  Substance Use Topics  .  Alcohol use: Not Currently    Comment: once a year  . Drug use: Yes    Frequency: 7.0 times per week    Types: Marijuana    Review of Systems Constitutional: No fever/chills.  He aches Eyes: No visual changes. ENT: Sore throat, ear pain/pressure, and nasal congestion.   Cardiovascular: Denies chest pain. Respiratory: Denies shortness of breath. Gastrointestinal: No abdominal pain.  No nausea, no vomiting.  No diarrhea.  No constipation. Genitourinary: Negative for dysuria. Musculoskeletal: Negative for back pain. Skin: Negative for rash. Neurological: Negative for headaches, focal weakness or numbness. Psychiatric: Anxiety, bipolar, depression.  Endocrine:  Diabetes, hyperlipidemia, hypertension. Hematological/Lymphatic:  Allergic/Immunilogical: Ativan, latex, tape,and Drixoral. ____________________________________________   PHYSICAL EXAM:  VITAL SIGNS: ED Triage Vitals [07/16/20 0823]  Enc Vitals Group     BP 139/87     Pulse Rate 96     Resp 20     Temp 98 F (36.7 C)     Temp Source Oral     SpO2 97 %     Weight 193 lb (87.5 kg)     Height 5\' 3"  (1.6 m)     Head Circumference      Peak Flow      Pain Score 8     Pain Loc      Pain Edu?      Excl. in [place]?     Constitutional: Alert and oriented. Well appearing and in no acute distress. Eyes: Conjunctivae are normal. PERRL. EOMI. Head: Atraumatic. Nose: Edematous nasal turbinates.  Mouth/Throat: Mucous membranes are moist.  Oropharynx erythematous. Neck: No stridor. Hematological/Lymphatic/Immunilogical: Bilateral cervical lymphadenopathy. Cardiovascular: Normal rate, regular rhythm.  Grossly normal heart sounds.  Good peripheral circulation. Respiratory: Normal respiratory effort.  No retractions. Lungs CTAB. Gastrointestinal: Soft and nontender. No distention. No abdominal bruits. No CVA tenderness. Genitourinary: Deferred Neurologic:  Normal speech and language. No gross focal neurologic deficits are appreciated. No gait instability. Skin:  Skin is warm, dry and intact. No rash noted. Psychiatric: Mood and affect are normal. Speech and behavior are normal.  ____________________________________________   LABS (all labs ordered are listed, but only abnormal results are displayed)  Labs Reviewed  GROUP A STREP BY PCR  SARS CORONAVIRUS 2 (TAT 6-24 HRS)   ____________________________________________  EKG   ____________________________________________  RADIOLOGY Cecilio Asper, personally viewed and evaluated these images (plain radiographs) as part of my medical decision making, as well as reviewing the written report by the radiologist.  ED MD interpretation:    Official radiology report(s): No results found.  ____________________________________________   PROCEDURES  Procedure(s) performed (including Critical Care):  Procedures   ____________________________________________   INITIAL IMPRESSION / ASSESSMENT AND PLAN / ED COURSE  As part of my medical decision making, I reviewed the following data within the [place]         Patient presents with sore throat and bilateral ear pressure.  Discussed negative strep results with patient.  Patient complaint physical exam consistent with viral pharyngitis.  Patient given discharge care instruction advised take medication as directed.  Patient advised she may follow-up the results for COVID-19 test results in the MyChart app.  If positive patient was quarantine for 10 days.      ____________________________________________   FINAL CLINICAL IMPRESSION(S) / ED  DIAGNOSES  Final diagnoses:  Sore throat     ED Discharge Orders         Ordered    chlorpheniramine-HYDROcodone (TUSSIONEX PENNKINETIC ER) 10-8 MG/5ML SUER  2 times daily        07/16/20 1030    lidocaine (XYLOCAINE) 2 % solution  Every 6 hours PRN        07/16/20 1030          *Please note:  Shawna Anderson was evaluated in Emergency Department on 07/16/2020 for the symptoms described in the history of present illness. She was evaluated in the context of the global COVID-19 pandemic, which necessitated consideration that the patient might be at risk for infection with the SARS-CoV-2 virus that causes COVID-19. Institutional protocols and algorithms that pertain to the evaluation of patients at risk for COVID-19 are in a state of rapid change based on information released by regulatory bodies including the CDC and federal and state organizations. These policies and algorithms were followed during the patient's care in the ED.  Some ED evaluations and interventions may be delayed as a result of limited staffing during and the pandemic.*   Note:  This document was prepared using Dragon voice recognition software and may include unintentional dictation errors.    Sable Feil, PA-C 07/16/20 1036    Blake Divine, MD 07/17/20 [phone]

## 2020-07-16 NOTE — Telephone Encounter (Signed)
Pt called she was seen at the ED today and the medication they called in is not approved by insurance and wanted to know TUSSIONEX  And lidocaine (XYLOCAINE) 2 % solution   Pt stated that she would try to call the ED to see if they could change to another medication

## 2020-07-17 ENCOUNTER — Other Ambulatory Visit: Payer: Self-pay

## 2020-07-17 DIAGNOSIS — I2510 Atherosclerotic heart disease of native coronary artery without angina pectoris: Secondary | ICD-10-CM

## 2020-07-17 DIAGNOSIS — E785 Hyperlipidemia, unspecified: Secondary | ICD-10-CM

## 2020-07-17 MED ORDER — EZETIMIBE 10 MG PO TABS: 10.0000 mg | ORAL_TABLET | Freq: Every day | ORAL | 1 refills | Status: DC

## 2020-07-17 NOTE — Telephone Encounter (Signed)
Noted  For your information   

## 2020-07-17 NOTE — Telephone Encounter (Signed)
Can you help this patient 

## 2020-07-18 ENCOUNTER — Telehealth: Payer: Self-pay | Admitting: Internal Medicine

## 2020-07-18 NOTE — Telephone Encounter (Signed)
Spoke with the Patient and she states that she has left voicemails but no one has contacted her back. States they send her to some ladies voicemail when she calls them.

## 2020-07-18 NOTE — Telephone Encounter (Signed)
Pt woke up this morning with her throat still swollen she went to the hosp Dr Manson Passey at the [facility] ed on 07/16/2020 and was given medication that was not covered by pt ins. And pt is still having symptoms. Pt would like to know if there's anything equal to this medication that she can take Hydrocodone/chlorphener suspension.  Please advise and Thank you!

## 2020-07-18 NOTE — Telephone Encounter (Signed)
Did she call back to the ED so they can help her? And speak with nursing

## 2020-07-18 NOTE — Telephone Encounter (Signed)
Pt having trouble getting meds ordered at visit 07/16/20 can you help?    Spoke with the Patient and she states that she has left voicemails but no one has contacted her back. States they send her to some ladies voicemail when she calls them.       Documentation     You  Tilford Pillar, CMA; Chesley Noon, MD; Darci Current, MD 2 hours ago (11:23 AM)   TM    Did she call back to the ED so they can help her? And speak with nursing         Documentation    Bronwen Betters, CMA routed conversation to You 2 hours ago (11:17 AM)   Doren Custard, [name] R routed conversation to Tilford Pillar, CMA 3 hours ago (10:42 AM)   Doren Custard, [name] R  [place], [place] E 3 hours ago (10:42 AM)   Doren Custard, [name] R 3 hours ago (10:42 AM)   RY    Pt woke up this morning with her throat still swollen she went to the hosp Dr Manson Passey at the [facility] ed on 07/16/2020 and was given medication that was not covered by pt ins. And pt is still having symptoms. Pt would like to know if there's anything equal to this medication that she can take Hydrocodone/chlorphener suspension.   Please advise and Thank you!

## 2020-07-19 ENCOUNTER — Institutional Professional Consult (permissible substitution): Payer: Medicare Other | Admitting: Pulmonary Disease

## 2020-07-20 ENCOUNTER — Ambulatory Visit
Admission: RE | Admit: 2020-07-20 | Discharge: 2020-07-20 | Disposition: A | Discharge status: Home / Self Care | Payer: Medicare Other | Source: Ambulatory Visit | Attending: Internal Medicine | Admitting: Internal Medicine

## 2020-07-20 ENCOUNTER — Other Ambulatory Visit: Payer: Self-pay

## 2020-07-20 DIAGNOSIS — K3184 Gastroparesis: Secondary | ICD-10-CM | POA: Diagnosis not present

## 2020-07-20 DIAGNOSIS — R109 Unspecified abdominal pain: Secondary | ICD-10-CM | POA: Diagnosis not present

## 2020-07-20 DIAGNOSIS — R140 Abdominal distension (gaseous): Secondary | ICD-10-CM | POA: Diagnosis not present

## 2020-07-20 DIAGNOSIS — K30 Functional dyspepsia: Secondary | ICD-10-CM | POA: Diagnosis not present

## 2020-07-20 MED ORDER — TECHNETIUM TC 99M SULFUR COLLOID
1.8820 | Freq: Once | INTRAVENOUS | Status: AC | PRN
  Administered 2020-07-20: 1.882 via INTRAVENOUS

## 2020-07-30 ENCOUNTER — Encounter (INDEPENDENT_AMBULATORY_CARE_PROVIDER_SITE_OTHER): Payer: Medicare Other | Admitting: Gastroenterology

## 2020-07-30 ENCOUNTER — Encounter: Payer: Self-pay | Admitting: Gastroenterology

## 2020-07-31 ENCOUNTER — Other Ambulatory Visit: Payer: Medicare Other

## 2020-07-31 NOTE — Progress Notes (Deleted)
Hematology/Oncology Consult note [facility]  [phone] [phone]  Patient Care Team: McLean-Scocuzza, Nino Glow, MD as PCP - General (Internal Medicine) End, Harrell Gave, MD as PCP - Cardiology (Cardiology)   Name of the patient: Shawna Anderson  [id number]  1965-10-17   Date of visit: 07/31/20  Diagnosis- routine f/u of leucocytosis and anemia  Chief complaint/ Reason for visit- leucocytosis and normocytic anemia  Heme/Onc history: patient is a 55 year old female with a past medical history significant for a s/p stent placement, COPD, GERD referred to me for leukocytosis/thrombocytosis.Looking back at her CBCs patient has had chronic leukocytosis at least dating back to 2016 when her white cell count fluctuates widely between 11-20. Differential has mostly show neutrophilia but at times has shown lymphocytosis and monocytosis. Hemoglobin is always been normal but most recently patient was noted to have thrombocytosis as well. Her most recent CBC from 07/18/2019 showed white cell count of 17, H&H of 13.7/40.8 and a platelet count of 550. Patient admits to smoking daily about half a pack per day. Also admits to smoking marijuana occasionally.  Further work-up in March 2021 including flow cytometry did not reveal any immunophenotypic abnormality.  JAK2, CAL R, MPL testing was negative.  BCR ABL FISH testing was negative.  HIV hepatitis B hepatitis C testing negative.  With regards to her easy bruising she underwent PT PTT INR, fibrinogen levels which were normal.  Von Willebrand panel was normal except for mildly elevated factor VIII level of 152%.  Thrombin time was normal.  PFA was not done as patient is on Plavix.   Interval history- ***  ECOG PS- *** Pain scale- *** Opioid associated constipation- ***  Review of systems- Review of Systems  Constitutional: Negative for chills, fever, malaise/fatigue and weight loss.  HENT:  Negative for congestion, ear discharge and nosebleeds.   Eyes: Negative for blurred vision.  Respiratory: Negative for cough, hemoptysis, sputum production, shortness of breath and wheezing.   Cardiovascular: Negative for chest pain, palpitations, orthopnea and claudication.  Gastrointestinal: Negative for abdominal pain, blood in stool, constipation, diarrhea, heartburn, melena, nausea and vomiting.  Genitourinary: Negative for dysuria, flank pain, frequency, hematuria and urgency.  Musculoskeletal: Negative for back pain, joint pain and myalgias.  Skin: Negative for rash.  Neurological: Negative for dizziness, tingling, focal weakness, seizures, weakness and headaches.  Endo/Heme/Allergies: Does not bruise/bleed easily.  Psychiatric/Behavioral: Negative for depression and suicidal ideas. The patient does not have insomnia.       Allergies  Allergen Reactions  . Ativan [Lorazepam]     Pt states it makes her tongue do weird things   . Latex Other (See Comments)    Patient stated that she was told by her doctor that she is "allergic to" this  . Tape Other (See Comments)    Patient stated that she was told by her doctor that she is "allergic to" this  . Xifaxan [Rifaximin]     Pt believes this is contributing to her abdominal pain/discomfort  . Drixoral Allergy Sinus [Dexbromphen-Pse-Apap Er] Rash     Past Medical History:  Diagnosis Date  . Anxiety   . Asthma   . Bipolar 1 disorder ([place])   . Bipolar disorder ([place])   . CAD (coronary artery disease)    s/p stent BMS OM Cx  . Cervical herniated disc 04/12/2016  . COPD (chronic obstructive pulmonary disease) ([place])   . Depression   . Diabetes mellitus without complication ([place])   . Diverticulitis   .  GERD (gastroesophageal reflux disease)   . Glaucoma   . History of blood transfusion   . Hyperlipidemia   . Hypertension   . OSA (obstructive sleep apnea)    not using cpap   . Plantar fasciitis    b/l feet s/p steroid shots  w/o help and left surgery w/o help   . UTI (urinary tract infection)      Past Surgical History:  Procedure Laterality Date  . ABDOMINAL HYSTERECTOMY    . ABDOMINAL SURGERY  1995   Bowel resection.  Marland Kitchen CARDIAC CATHETERIZATION N/A 04/14/2016   Procedure: Left Heart Cath and Coronary Angiography;  Surgeon: Burnell Blanks, MD;  Location: [facility];  Service: Cardiovascular;  Laterality: N/A;  . CARDIAC SURGERY    . COLONOSCOPY WITH PROPOFOL N/A 07/11/2019   Procedure: COLONOSCOPY WITH PROPOFOL;  Surgeon: Jonathon Bellows, MD;  Location: [facility] ENDOSCOPY;  Service: Gastroenterology;  Laterality: N/A;  . COLONOSCOPY WITH PROPOFOL N/A 07/29/2019   Procedure: COLONOSCOPY WITH PROPOFOL;  Surgeon: Jonathon Bellows, MD;  Location: [facility] ENDOSCOPY;  Service: Gastroenterology;  Laterality: N/A;  . CORONARY ANGIOPLASTY WITH STENT PLACEMENT  2010   Drug eluting stent  . ESOPHAGOGASTRODUODENOSCOPY (EGD) WITH PROPOFOL N/A 07/11/2019   Procedure: ESOPHAGOGASTRODUODENOSCOPY (EGD) WITH PROPOFOL;  Surgeon: Jonathon Bellows, MD;  Location: [facility] ENDOSCOPY;  Service: Gastroenterology;  Laterality: N/A;  . OVARIAN CYST REMOVAL      Social History   Socioeconomic History  . Marital status: Single    Spouse name: Not on file  . Number of children: Not on file  . Years of education: Not on file  . Highest education level: Not on file  Occupational History  . Not on file  Tobacco Use  . Smoking status: Former Smoker    Packs/day: 0.50    Years: 30.00    Pack years: 15.00    Types: Cigarettes    Quit date: 03/22/2020    Years since quitting: 0.3  . Smokeless tobacco: Never Used  . Tobacco comment: refused  Vaping Use  . Vaping Use: Never used  Substance and Sexual Activity  . Alcohol use: Not Currently    Comment: once a year  . Drug use: Yes    Frequency: 7.0 times per week    Types: Marijuana  . Sexual activity: Not on file  Other Topics Concern  . Not on file  Social History Narrative   From  [place] now living in [place]    1 son    No guns    Wears seat belt   Safe in relationship    Social Determinants of Health   Financial Resource Strain: Not on file  Food Insecurity: Not on file  Transportation Needs: Not on file  Physical Activity: Not on file  Stress: Not on file  Social Connections: Not on file  Intimate Partner Violence: Not on file    Family History  Problem Relation Age of Onset  . CAD Mother   . Depression Mother   . Heart disease Mother   . Hyperlipidemia Mother   . Hypertension Mother   . CAD Brother   . Depression Brother   . Diabetes Brother   . Heart disease Brother   . Hyperlipidemia Brother   . Heart disease Father   . Alcohol abuse Father   . Lupus Other   . Sickle cell anemia Other      Current Outpatient Medications:  .  albuterol (PROAIR HFA) 108 (90 Base) MCG/ACT inhaler,  Inhale 1-2 puffs into the lungs every 6 (six) hours as needed for wheezing or shortness of breath., Disp: 54 g, Rfl: 3 .  ALPRAZolam (XANAX) 0.25 MG tablet, Take 1 tablet (0.25 mg total) by mouth at bedtime as needed for anxiety or sleep. (Patient not taking: No sig reported), Disp: 30 tablet, Rfl: 2 .  amLODipine (NORVASC) 10 MG tablet, Take 1 tablet (10 mg total) by mouth at bedtime., Disp: 90 tablet, Rfl: 3 .  aspirin EC 81 MG tablet, Take 1 tablet (81 mg total) by mouth daily., Disp: 90 tablet, Rfl: 3 .  atorvastatin (LIPITOR) 80 MG tablet, Take 1 tablet (80 mg total) by mouth daily at 6 PM., Disp: 90 tablet, Rfl: 3 .  chlorpheniramine-HYDROcodone (TUSSIONEX PENNKINETIC ER) 10-8 MG/5ML SUER, Take 5 mLs by mouth 2 (two) times daily. (Patient not taking: Reported on 07/30/2020), Disp: 115 mL, Rfl: 0 .  clopidogrel (PLAVIX) 75 MG tablet, Take 1 tablet (75 mg total) by mouth daily., Disp: 90 tablet, Rfl: 3 .  cyclobenzaprine (FLEXERIL) 5 MG tablet, Take 1 tablet (5 mg total) by mouth at bedtime as needed for muscle spasms., Disp: 90 tablet, Rfl: 3 .   diclofenac Sodium (VOLTAREN) 1 % GEL, Apply 2 g topically 4 (four) times daily. Prn OA and chest wall pain, Disp: 450 g, Rfl: 3 .  divalproex (DEPAKOTE) 500 MG DR tablet, Take 1 tablet (500 mg total) by mouth 2 (two) times daily., Disp: 180 tablet, Rfl: 1 .  ezetimibe (ZETIA) 10 MG tablet, Take 1 tablet (10 mg total) by mouth daily. With lipitor 80 mg at night, Disp: 90 tablet, Rfl: 1 .  ipratropium (ATROVENT) 0.06 % nasal spray, Place 2 sprays into both nostrils 3 (three) times daily., Disp: 45 mL, Rfl: 3 .  Ipratropium-Albuterol (COMBIVENT RESPIMAT) 20-100 MCG/ACT AERS respimat, Inhale 1 puff into the lungs every 6 (six) hours as needed for wheezing., Disp: 4 g, Rfl: 0 .  Ipratropium-Albuterol (COMBIVENT RESPIMAT) 20-100 MCG/ACT AERS respimat, Inhale 1 puff into the lungs every 6 (six) hours as needed for wheezing., Disp: 12 g, Rfl: 3 .  ipratropium-albuterol (DUONEB) 0.5-2.5 (3) MG/3ML SOLN, Take 3 mLs by nebulization every 6 (six) hours as needed., Disp: 360 mL, Rfl: 11 .  isosorbide mononitrate (IMDUR) 30 MG 24 hr tablet, TAKE 1 TABLET BY MOUTH  DAILY, Disp: 90 tablet, Rfl: 0 .  levocetirizine (XYZAL) 5 MG tablet, Take 1 tablet (5 mg total) by mouth at bedtime as needed for allergies., Disp: 90 tablet, Rfl: 3 .  lidocaine (XYLOCAINE) 2 % solution, Use as directed 5 mLs in the mouth or throat every 6 (six) hours as needed for mouth pain. For oral swish and swallow., Disp: 100 mL, Rfl: 0 .  mometasone-formoterol (DULERA) 100-5 MCG/ACT AERO, Inhale 2 puffs into the lungs in the morning and at bedtime. Rinse mouth, Disp: 1 each, Rfl: 0 .  nitroGLYCERIN (NITROSTAT) 0.4 MG SL tablet, DISSOLVE 1 TABLET UNDER THE TONGUE EVERY 5 MINUTES AS  NEEDED FOR CHEST PAIN. MAX  OF 3 TABS IN 15 MINS. CALL  911 AFTER 3RD DOSE, Disp: 25 tablet, Rfl: 0 .  olmesartan-hydrochlorothiazide (BENICAR HCT) 40-25 MG tablet, Take 1 tablet by mouth daily., Disp: 90 tablet, Rfl: 3 .  ondansetron (ZOFRAN ODT) 4 MG disintegrating  tablet, Take 1 tablet (4 mg total) by mouth every 8 (eight) hours as needed for nausea or vomiting., Disp: 270 tablet, Rfl: 3 .  pantoprazole (PROTONIX) 40 MG tablet, TAKE 1 TABLET BY MOUTH  TWICE DAILY BEFORE MEALS, Disp: 180 tablet, Rfl: 3 .  predniSONE (DELTASONE) 10 MG tablet, Take 6 tablets ( total 60 mg) by mouth for 1 day; take 5 tablets ( total 50 mg) by mouth the next day;  and then decrease by 1 tablet ( total 10mg ) every day until off., Disp: 20 tablet, Rfl: 0 .  promethazine (PHENERGAN) 25 MG suppository, Place 1 suppository (25 mg total) rectally every 6 (six) hours as needed for nausea. (Patient not taking: No sig reported), Disp: 12 suppository, Rfl: 1 .  senna-docusate (SENOKOT-S) 8.6-50 MG tablet, Take 1-2 tablets by mouth daily as needed for mild constipation. (Patient not taking: Reported on 07/30/2020), Disp: 90 tablet, Rfl: 3 .  traZODone (DESYREL) 150 MG tablet, Take 1 tablet (150 mg total) by mouth at bedtime as needed for sleep., Disp: 90 tablet, Rfl: 3  Physical exam: There were no vitals filed for this visit. Physical Exam   CMP Latest Ref Rng & Units 05/22/2020  Glucose 70 - 99 mg/dL 133(H)  BUN 6 - 20 mg/dL 20  Creatinine 0.44 - 1.00 mg/dL 0.72  Sodium 135 - 145 mmol/L 137  Potassium 3.5 - 5.1 mmol/L 3.8  Chloride 98 - 111 mmol/L 97(L)  CO2 22 - 32 mmol/L 27  Calcium 8.9 - 10.3 mg/dL 9.3  Total Protein 6.0 - 8.3 g/dL -  Total Bilirubin 0.2 - 1.2 mg/dL -  Alkaline Phos 39 - 117 U/L -  AST 0 - 37 U/L -  ALT 0 - 35 U/L -   CBC Latest Ref Rng & Units 05/22/2020  WBC 4.0 - 10.5 K/uL 20.4(H)  Hemoglobin 12.0 - 15.0 g/dL 12.2  Hematocrit 36.0 - 46.0 % 35.8(L)  Platelets 150 - 400 K/uL 463(H)    No images are attached to the encounter.  NM Gastric Emptying  Result Date: 07/20/2020 CLINICAL DATA:  Left-sided abdominal pain and bloating. EXAM: NUCLEAR MEDICINE GASTRIC EMPTYING SCAN TECHNIQUE: After oral ingestion of radiolabeled meal, sequential abdominal images were  obtained for 4 hours. Percentage of activity emptying the stomach was calculated at 1 hour, 2 hour, 3 hour, and 4 hours. RADIOPHARMACEUTICALS:  1.9 mCi Tc-41m sulfur colloid in standardized meal COMPARISON:  None. FINDINGS: Expected location of the stomach in the left upper quadrant. Ingested meal empties the stomach gradually over the course of the study. 40% emptied at 1 hr ( normal >= 10%) 58% emptied at 2 hr ( normal >= 40%) 67% emptied at 3 hr ( normal >= 70%) 89% emptied at 4 hr ( normal >= 90%) IMPRESSION: Slightly delayed late-phase gastric emptying at 3 and 4 hours. Electronically Signed   By: Marlaine Hind M.D.   On: 07/20/2020 17:11     Assessment and plan- Patient is a 55 y.o. female who is here for follow up of following issues:     Visit Diagnosis 1. Normocytic anemia   2. Leukocytosis, unspecified type      Dr. Randa Evens, MD, MPH [facility] at [facility] [phone] 07/31/2020 3:17 PM

## 2020-07-31 NOTE — Progress Notes (Signed)
Rescheduled

## 2020-08-01 ENCOUNTER — Other Ambulatory Visit: Payer: Medicare Other

## 2020-08-01 ENCOUNTER — Other Ambulatory Visit: Payer: Self-pay

## 2020-08-01 DIAGNOSIS — Z20822 Contact with and (suspected) exposure to covid-19: Secondary | ICD-10-CM

## 2020-08-02 ENCOUNTER — Inpatient Hospital Stay: Payer: Medicare Other | Admitting: Oncology

## 2020-08-02 ENCOUNTER — Inpatient Hospital Stay: Payer: Medicare Other

## 2020-08-02 DIAGNOSIS — D649 Anemia, unspecified: Secondary | ICD-10-CM

## 2020-08-02 DIAGNOSIS — D72829 Elevated white blood cell count, unspecified: Secondary | ICD-10-CM

## 2020-08-03 LAB — SARS-COV-2, NAA 2 DAY TAT

## 2020-08-03 LAB — NOVEL CORONAVIRUS, NAA: SARS-CoV-2, NAA: NOT DETECTED

## 2020-08-09 ENCOUNTER — Other Ambulatory Visit: Payer: Self-pay

## 2020-08-09 ENCOUNTER — Ambulatory Visit (INDEPENDENT_AMBULATORY_CARE_PROVIDER_SITE_OTHER): Payer: Medicare Other | Admitting: Gastroenterology

## 2020-08-09 ENCOUNTER — Encounter: Payer: Self-pay | Admitting: Gastroenterology

## 2020-08-09 VITALS — BP 140/77 | HR 79 | Ht 63.0 in | Wt 193.2 lb

## 2020-08-09 DIAGNOSIS — R140 Abdominal distension (gaseous): Secondary | ICD-10-CM | POA: Diagnosis not present

## 2020-08-09 DIAGNOSIS — K5900 Constipation, unspecified: Secondary | ICD-10-CM | POA: Diagnosis not present

## 2020-08-09 DIAGNOSIS — K3184 Gastroparesis: Secondary | ICD-10-CM | POA: Diagnosis not present

## 2020-08-09 DIAGNOSIS — K649 Unspecified hemorrhoids: Secondary | ICD-10-CM

## 2020-08-09 DIAGNOSIS — K219 Gastro-esophageal reflux disease without esophagitis: Secondary | ICD-10-CM | POA: Diagnosis not present

## 2020-08-09 NOTE — Patient Instructions (Signed)
Gastroparesis  Gastroparesis is a condition in which food takes longer than normal to empty from the stomach. This condition is also known as delayed gastric emptying. It is usually a long-term (chronic) condition. There is no cure, but there are treatments and things that you can do at home to help relieve symptoms. Treating the underlying condition that causes gastroparesis can also help relieve symptoms. What are the causes? In many cases, the cause of this condition is not known. Possible causes include:  A hormone (endocrine) disorder, such as hypothyroidism or diabetes.  A nervous system disease, such as Parkinson's disease or multiple sclerosis.  Cancer, infection, or surgery that affects the stomach or vagus nerve. The vagus nerve runs from your chest, through your neck, and to the lower part of your brain.  A connective tissue disorder, such as scleroderma.  Certain medicines. What increases the risk? You are more likely to develop this condition if:  You have certain disorders or diseases. These may include: ? An endocrine disorder. ? An eating disorder. ? Amyloidosis. ? Scleroderma. ? Parkinson's disease. ? Multiple sclerosis. ? Cancer or infection of the stomach or the vagus nerve.  You have had surgery on your stomach or vagus nerve.  You take certain medicines.  You are female. What are the signs or symptoms? Symptoms of this condition include:  Feeling full after eating very little or a loss of appetite.  Nausea, vomiting, or heartburn.  Bloating of your abdomen.  Inconsistent blood sugar (glucose) levels on blood tests.  Unexplained weight loss.  Acid from the stomach coming up into the esophagus (gastroesophageal reflux).  Sudden tightening (spasm) of the stomach, which can be painful. Symptoms may come and go. Some people may not notice any symptoms. How is this diagnosed? This condition is diagnosed with tests, such as:  Tests that check how  long it takes food to move through the stomach and intestines. These tests include: ? Upper gastrointestinal (GI) series. For this test, you drink a liquid that shows up well on X-rays, and then X-rays are taken of your intestines. ? Gastric emptying scintigraphy. For this test, you eat food that contains a small amount of radioactive material, and then scans are taken. ? Wireless capsule GI monitoring system. For this test, you swallow a pill (capsule) that records information about how foods and fluid move through your stomach.  Gastric manometry. For this test, a tube is passed down your throat and into your stomach to measure electrical and muscular activity.  Endoscopy. For this test, a long, thin tube with a camera and light on the end is passed down your throat and into your stomach to check for problems in your stomach lining.  Ultrasound. This test uses sound waves to create images of the inside of your body. This can help rule out gallbladder disease or pancreatitis as a cause of your symptoms. How is this treated? There is no cure for this condition, but treatment and home care may relieve symptoms. Treatment may include:  Treating the underlying cause.  Managing your symptoms by making changes to your diet and exercise habits.  Taking medicines to control nausea and vomiting and to stimulate stomach muscles.  Getting food through a feeding tube in the hospital. This may be done in severe cases.  Having surgery to insert a device called a gastric electrical stimulator into your body. This device helps improve stomach emptying and control nausea and vomiting. Follow these instructions at home:  Take over-the-counter and   prescription medicines only as told by your health care provider.  Follow instructions from your health care provider about eating or drinking restrictions. Your health care provider may recommend that you: ? Eat smaller meals more often. ? Eat low-fat  foods. ? Eat low-fiber forms of high-fiber foods. For example, eat cooked vegetables instead of raw vegetables. ? Have only liquid foods instead of solid foods. Liquid foods are easier to digest.  Drink enough fluid to keep your urine pale yellow.  Exercise as often as told by your health care provider.  Keep all follow-up visits. This is important. Contact a health care provider if you:  Notice that your symptoms do not improve with treatment.  Have new symptoms. Get help right away if you:  Have severe pain in your abdomen that does not improve with treatment.  Have nausea that is severe or does not go away.  Vomit every time you drink fluids. Summary  Gastroparesis is a long-term (chronic) condition in which food takes longer than normal to empty from the stomach.  Symptoms include nausea, vomiting, heartburn, bloating of your abdomen, and loss of appetite.  Eating smaller portions, low-fat foods, and low-fiber forms of high-fiber foods may help you manage your symptoms.  Get help right away if you have severe pain in your abdomen. This information is not intended to replace advice given to you by your health care provider. Make sure you discuss any questions you have with your health care provider. Document Revised: 11/07/2019 Document Reviewed: 11/07/2019 Elsevier Patient Education  2021 [place]. Hemorrhoids Hemorrhoids are swollen veins that may develop:  In the butt (rectum). These are called internal hemorrhoids.  Around the opening of the butt (anus). These are called external hemorrhoids. Hemorrhoids can cause pain, itching, or bleeding. Most of the time, they do not cause serious problems. They usually get better with diet changes, lifestyle changes, and other home treatments. What are the causes? This condition may be caused by:  Having trouble pooping (constipation).  Pushing hard (straining) to poop.  Watery poop (diarrhea).  Pregnancy.  Being  very overweight (obese).  Sitting for long periods of time.  Heavy lifting or other activity that causes you to strain.  Anal sex.  Riding a bike for a long period of time. What are the signs or symptoms? Symptoms of this condition include:  Pain.  Itching or soreness in the butt.  Bleeding from the butt.  Leaking poop.  Swelling in the area.  One or more lumps around the opening of your butt. How is this diagnosed? A doctor can often diagnose this condition by looking at the affected area. The doctor may also:  Do an exam that involves feeling the area with a gloved hand (digital rectal exam).  Examine the area inside your butt using a small tube (anoscope).  Order blood tests. This may be done if you have lost a lot of blood.  Have you get a test that involves looking inside the colon using a flexible tube with a camera on the end (sigmoidoscopy or colonoscopy). How is this treated? This condition can usually be treated at home. Your doctor may tell you to change what you eat, make lifestyle changes, or try home treatments. If these do not help, procedures can be done to remove the hemorrhoids or make them smaller. These may involve:  Placing rubber bands at the base of the hemorrhoids to cut off their blood supply.  Injecting medicine into the hemorrhoids to shrink them.  Shining a type of light energy onto the hemorrhoids to cause them to fall off.  Doing surgery to remove the hemorrhoids or cut off their blood supply. Follow these instructions at home: Eating and drinking  Eat foods that have a lot of fiber in them. These include whole grains, beans, nuts, fruits, and vegetables.  Ask your doctor about taking products that have added fiber (fibersupplements).  Reduce the amount of fat in your diet. You can do this by: ? Eating low-fat dairy products. ? Eating less red meat. ? Avoiding processed foods.  Drink enough fluid to keep your pee (urine) pale  yellow.   Managing pain and swelling  Take a warm-water bath (sitz bath) for 20 minutes to ease pain. Do this 3-4 times a day. You may do this in a bathtub or using a portable sitz bath that fits over the toilet.  If told, put ice on the painful area. It may be helpful to use ice between your warm baths. ? Put ice in a plastic bag. ? Place a towel between your skin and the bag. ? Leave the ice on for 20 minutes, 2-3 times a day.   General instructions  Take over-the-counter and prescription medicines only as told by your doctor. ? Medicated creams and medicines may be used as told.  Exercise often. Ask your doctor how much and what kind of exercise is best for you.  Go to the bathroom when you have the urge to poop. Do not wait.  Avoid pushing too hard when you poop.  Keep your butt dry and clean. Use wet toilet paper or moist towelettes after pooping.  Do not sit on the toilet for a long time.  Keep all follow-up visits as told by your doctor. This is important. Contact a doctor if you:  Have pain and swelling that do not get better with treatment or medicine.  Have trouble pooping.  Cannot poop.  Have pain or swelling outside the area of the hemorrhoids. Get help right away if you have:  Bleeding that will not stop. Summary  Hemorrhoids are swollen veins in the butt or around the opening of the butt.  They can cause pain, itching, or bleeding.  Eat foods that have a lot of fiber in them. These include whole grains, beans, nuts, fruits, and vegetables.  Take a warm-water bath (sitz bath) for 20 minutes to ease pain. Do this 3-4 times a day. This information is not intended to replace advice given to you by your health care provider. Make sure you discuss any questions you have with your health care provider. Document Revised: 07/08/2018 Document Reviewed: 11/19/2017 Elsevier Patient Education  [place].

## 2020-08-09 NOTE — Progress Notes (Signed)
Jonathon Bellows MD, MRCP(U.K) [address]  [place]  [place], [place] [address]  Main: [phone]  Fax: [phone]   Primary Care Physician: McLean-Scocuzza, Nino Glow, MD  Primary Gastroenterologist:  Dr. Jonathon Bellows   Hemmoroids evaluation   HPI: Shawna Anderson is a 55 y.o. female   Summary of history :  Initially seen and referred in November 2020 for GERD  >10 years.  History of partial colectomy for diverticulitis. Hysterectomy showing an indeterminant left adnexal soft tissue focus adjacent to perianastomotic thick-walled blind ending distal colonic limb. Left ovarian mass cannot be excluded entirely.    05/20/2019: Ultrasound pelvis: Collection with a shadowing calcification seen in the left adnexa adjacent to the pelvic sidewall correspondents well to a collection seen on previous CT. Possible postsurgical or lesion of ovarian adnexal origin. Recommended to follow-up CT or MRI in 1 to 2 months.  07/11/2019: EGD: Nonobstructive Schatzki's ring seen inflammation seen at the antrum of the stomach, biopsies showed no significant abnormalities. Colonoscopy: Patient was unable to be sedated and hence procedure could not be performed she has had issues with sedation in the past.  07/18/2019: Came into the emergency room with abdominal pain. CT scan of the abdomen was repeated showed no significant abnormality. Hemoglobin 13.7 g. White cell count of 17. CMP normal except an elevated glucose of 150. Lipase normal. 07/29/2019: Colonoscopy: 5 mm polyp in the distal ascending colon resected otherwise normal colonoscopy pathology of the polyp suggests mucosal ectasia and is benign. ED visit on 09/21/2019 for severe retching, dry heaving and left-sided abdominal pain. Treated with IV fluids Maalox and discharge.  09/21/2019 CT scan of the abdomen and pelvis with contrast showed no acute abnormality.  09/21/2019: AST, ALT were elevated at 122 and 146. Creatinine was  higher than baseline at 1.07. Potassium was 3.2. 11/15/2019: Transaminases almost normalized with AST of 27 and ALT of 41.   Interval history7/19/2021-08/09/2020  02/14/2020: Barium swallow : normal exam   07/20/2020: Gastric emptying study : slightly delayed.    Continues to smoke marijuana and cigarettes.  Continues to have issues with early satiety.  Discussed the results of the gastric emptying study.  She also complains of constipation.  Has not taken any medications for the same.  Complains of swelling of her hemorrhoids on and off when she had a bowel movement.  Current Outpatient Medications  Medication Sig Dispense Refill  . albuterol (PROAIR HFA) 108 (90 Base) MCG/ACT inhaler Inhale 1-2 puffs into the lungs every 6 (six) hours as needed for wheezing or shortness of breath. 54 g 3  . amLODipine (NORVASC) 10 MG tablet Take 1 tablet (10 mg total) by mouth at bedtime. 90 tablet 3  . aspirin EC 81 MG tablet Take 1 tablet (81 mg total) by mouth daily. 90 tablet 3  . clopidogrel (PLAVIX) 75 MG tablet Take 1 tablet (75 mg total) by mouth daily. 90 tablet 3  . cyclobenzaprine (FLEXERIL) 5 MG tablet Take 1 tablet (5 mg total) by mouth at bedtime as needed for muscle spasms. 90 tablet 3  . diclofenac Sodium (VOLTAREN) 1 % GEL Apply 2 g topically 4 (four) times daily. Prn OA and chest wall pain 450 g 3  . divalproex (DEPAKOTE) 500 MG DR tablet Take 1 tablet (500 mg total) by mouth 2 (two) times daily. 180 tablet 1  . ezetimibe (ZETIA) 10 MG tablet Take 1 tablet (10 mg total) by mouth daily. With lipitor 80 mg at night 90 tablet 1  .  ipratropium (ATROVENT) 0.06 % nasal spray Place 2 sprays into both nostrils 3 (three) times daily. 45 mL 3  . Ipratropium-Albuterol (COMBIVENT RESPIMAT) 20-100 MCG/ACT AERS respimat Inhale 1 puff into the lungs every 6 (six) hours as needed for wheezing. 4 g 0  . Ipratropium-Albuterol (COMBIVENT RESPIMAT) 20-100 MCG/ACT AERS respimat Inhale 1 puff into the lungs every  6 (six) hours as needed for wheezing. 12 g 3  . ipratropium-albuterol (DUONEB) 0.5-2.5 (3) MG/3ML SOLN Take 3 mLs by nebulization every 6 (six) hours as needed. 360 mL 11  . isosorbide mononitrate (IMDUR) 30 MG 24 hr tablet TAKE 1 TABLET BY MOUTH  DAILY 90 tablet 0  . levocetirizine (XYZAL) 5 MG tablet Take 1 tablet (5 mg total) by mouth at bedtime as needed for allergies. 90 tablet 3  . lidocaine (XYLOCAINE) 2 % solution Use as directed 5 mLs in the mouth or throat every 6 (six) hours as needed for mouth pain. For oral swish and swallow. 100 mL 0  . mometasone-formoterol (DULERA) 100-5 MCG/ACT AERO Inhale 2 puffs into the lungs in the morning and at bedtime. Rinse mouth 1 each 0  . nitroGLYCERIN (NITROSTAT) 0.4 MG SL tablet DISSOLVE 1 TABLET UNDER THE TONGUE EVERY 5 MINUTES AS  NEEDED FOR CHEST PAIN. MAX  OF 3 TABS IN 15 MINS. CALL  911 AFTER 3RD DOSE 25 tablet 0  . olmesartan-hydrochlorothiazide (BENICAR HCT) 40-25 MG tablet Take 1 tablet by mouth daily. 90 tablet 3  . ondansetron (ZOFRAN ODT) 4 MG disintegrating tablet Take 1 tablet (4 mg total) by mouth every 8 (eight) hours as needed for nausea or vomiting. 270 tablet 3  . pantoprazole (PROTONIX) 40 MG tablet TAKE 1 TABLET BY MOUTH  TWICE DAILY BEFORE MEALS 180 tablet 3  . predniSONE (DELTASONE) 10 MG tablet Take 6 tablets ( total 60 mg) by mouth for 1 day; take 5 tablets ( total 50 mg) by mouth the next day;  and then decrease by 1 tablet ( total 10mg ) every day until off. 20 tablet 0  . traZODone (DESYREL) 150 MG tablet Take 1 tablet (150 mg total) by mouth at bedtime as needed for sleep. 90 tablet 3  . ALPRAZolam (XANAX) 0.25 MG tablet Take 1 tablet (0.25 mg total) by mouth at bedtime as needed for anxiety or sleep. (Patient not taking: No sig reported) 30 tablet 2  . atorvastatin (LIPITOR) 80 MG tablet Take 1 tablet (80 mg total) by mouth daily at 6 PM. 90 tablet 3  . chlorpheniramine-HYDROcodone (TUSSIONEX PENNKINETIC ER) 10-8 MG/5ML SUER  Take 5 mLs by mouth 2 (two) times daily. (Patient not taking: No sig reported) 115 mL 0  . promethazine (PHENERGAN) 25 MG suppository Place 1 suppository (25 mg total) rectally every 6 (six) hours as needed for nausea. (Patient not taking: No sig reported) 12 suppository 1  . senna-docusate (SENOKOT-S) 8.6-50 MG tablet Take 1-2 tablets by mouth daily as needed for mild constipation. (Patient not taking: No sig reported) 90 tablet 3   No current facility-administered medications for this visit.    Allergies as of 08/09/2020 - Review Complete 08/09/2020  Allergen Reaction Noted  . Ativan [lorazepam]  11/14/2017  . Latex Other (See Comments) 02/12/2017  . Tape Other (See Comments) 02/12/2017  . Xifaxan [rifaximin]  09/21/2019  . Drixoral allergy sinus [dexbromphen-pse-apap er] Rash 01/12/2015    ROS:  General: Negative for anorexia, weight loss, fever, chills, fatigue, weakness. ENT: Negative for hoarseness, difficulty swallowing , nasal congestion. CV: Negative  for chest pain, angina, palpitations, dyspnea on exertion, peripheral edema.  Respiratory: Negative for dyspnea at rest, dyspnea on exertion, cough, sputum, wheezing.  GI: See history of present illness. GU:  Negative for dysuria, hematuria, urinary incontinence, urinary frequency, nocturnal urination.  Endo: Negative for unusual weight change.    Physical Examination:   BP 140/77 (BP Location: Left Arm, Patient Position: Sitting, Cuff Size: Large)   Pulse 79   Ht 5\' 3"  (1.6 m)   Wt 193 lb 3.2 oz (87.6 kg)   BMI 34.22 kg/m   General: Well-nourished, well-developed in no acute distress.  Eyes: No icterus. Conjunctivae pink. Neuro: Alert and oriented x 3.  Grossly intact. Skin: Warm and dry, no jaundice.   Psych: Alert and cooperative, normal mood and affect.   Imaging Studies: NM Gastric Emptying  Result Date: 07/20/2020 CLINICAL DATA:  Left-sided abdominal pain and bloating. EXAM: NUCLEAR MEDICINE GASTRIC EMPTYING  SCAN TECHNIQUE: After oral ingestion of radiolabeled meal, sequential abdominal images were obtained for 4 hours. Percentage of activity emptying the stomach was calculated at 1 hour, 2 hour, 3 hour, and 4 hours. RADIOPHARMACEUTICALS:  1.9 mCi Tc-87m sulfur colloid in standardized meal COMPARISON:  None. FINDINGS: Expected location of the stomach in the left upper quadrant. Ingested meal empties the stomach gradually over the course of the study. 40% emptied at 1 hr ( normal >= 10%) 58% emptied at 2 hr ( normal >= 40%) 67% emptied at 3 hr ( normal >= 70%) 89% emptied at 4 hr ( normal >= 90%) IMPRESSION: Slightly delayed late-phase gastric emptying at 3 and 4 hours. Electronically Signed   By: Marlaine Hind M.D.   On: 07/20/2020 17:11    Assessment and Plan:   ESMI EVERTSEN is a 55 y.o. y/o female with a history of bloating and abdominal distention which could be due to adhesions from her prior abdominal surgery. She has had these issues for many years she states.   I have previously stated on multiple occasions that part of her symptoms is likely due to the effects of marijuana which can cause delayed gastric emptying/cannabinoid hyperemesis syndrome in addition to aerophagia.  The patient does not believe that can be the cause and was rather upset by the suggestion.  I suggested that irrespective of the cause would recommend a gastroparesis diet, MiraLAX for constipation.  In terms of her hemorrhoids she has had prior surgery on the same.  Suggest Preparation H, sitz bath, MiraLAX to prevent constipation if the issue persists may need to be seen by a surgeon as she has had prior surgical treatment of her hemorrhoids   Dr Jonathon Bellows  MD,MRCP [facility]) Follow up in as needed

## 2020-08-15 ENCOUNTER — Other Ambulatory Visit: Payer: Self-pay | Admitting: Internal Medicine

## 2020-08-15 DIAGNOSIS — F319 Bipolar disorder, unspecified: Secondary | ICD-10-CM

## 2020-08-16 ENCOUNTER — Inpatient Hospital Stay: Payer: Medicare Other | Attending: Oncology

## 2020-08-16 ENCOUNTER — Institutional Professional Consult (permissible substitution): Payer: Medicare Other | Admitting: Pulmonary Disease

## 2020-08-16 DIAGNOSIS — F1290 Cannabis use, unspecified, uncomplicated: Secondary | ICD-10-CM | POA: Diagnosis not present

## 2020-08-16 DIAGNOSIS — Z79899 Other long term (current) drug therapy: Secondary | ICD-10-CM | POA: Diagnosis not present

## 2020-08-16 DIAGNOSIS — D75839 Thrombocytosis, unspecified: Secondary | ICD-10-CM | POA: Insufficient documentation

## 2020-08-16 DIAGNOSIS — D729 Disorder of white blood cells, unspecified: Secondary | ICD-10-CM | POA: Insufficient documentation

## 2020-08-16 LAB — CBC WITH DIFFERENTIAL/PLATELET
Abs Immature Granulocytes: 0.14 10*3/uL — ABNORMAL HIGH (ref 0.00–0.07)
Basophils Absolute: 0.1 10*3/uL (ref 0.0–0.1)
Basophils Relative: 1 %
Eosinophils Absolute: 0.5 10*3/uL (ref 0.0–0.5)
Eosinophils Relative: 4 %
HCT: 38.2 % (ref 36.0–46.0)
Hemoglobin: 13.1 g/dL (ref 12.0–15.0)
Immature Granulocytes: 1 %
Lymphocytes Relative: 27 %
Lymphs Abs: 3.5 10*3/uL (ref 0.7–4.0)
MCH: 29.9 pg (ref 26.0–34.0)
MCHC: 34.3 g/dL (ref 30.0–36.0)
MCV: 87.2 fL (ref 80.0–100.0)
Monocytes Absolute: 1.2 10*3/uL — ABNORMAL HIGH (ref 0.1–1.0)
Monocytes Relative: 9 %
Neutro Abs: 7.9 10*3/uL — ABNORMAL HIGH (ref 1.7–7.7)
Neutrophils Relative %: 58 %
Platelets: 424 10*3/uL — ABNORMAL HIGH (ref 150–400)
RBC: 4.38 MIL/uL (ref 3.87–5.11)
RDW: 14 % (ref 11.5–15.5)
WBC: 13.3 10*3/uL — ABNORMAL HIGH (ref 4.0–10.5)
nRBC: 0 % (ref 0.0–0.2)

## 2020-08-17 ENCOUNTER — Inpatient Hospital Stay: Payer: Medicare Other

## 2020-08-17 ENCOUNTER — Inpatient Hospital Stay (HOSPITAL_BASED_OUTPATIENT_CLINIC_OR_DEPARTMENT_OTHER): Payer: Medicare Other | Admitting: Oncology

## 2020-08-17 ENCOUNTER — Inpatient Hospital Stay: Payer: Medicare Other | Admitting: Oncology

## 2020-08-17 DIAGNOSIS — D729 Disorder of white blood cells, unspecified: Secondary | ICD-10-CM

## 2020-08-17 DIAGNOSIS — D75839 Thrombocytosis, unspecified: Secondary | ICD-10-CM | POA: Diagnosis not present

## 2020-08-21 ENCOUNTER — Other Ambulatory Visit: Payer: Self-pay | Admitting: Family

## 2020-08-21 NOTE — Progress Notes (Signed)
I connected with [facility] on 08/21/20 at  3:00 PM EST by video enabled telemedicine visit and verified that I am speaking with the correct person using two identifiers.   I discussed the limitations, risks, security and privacy concerns of performing an evaluation and management service by telemedicine and the availability of in-person appointments. I also discussed with the patient that there may be a patient responsible charge related to this service. The patient expressed understanding and agreed to proceed.  Other persons participating in the visit and their role in the encounter:  none  Patient's location:  home Provider's location:  work  Risk analyst Complaint: Discuss results of blood work  History of present illness: patient is a 55 year old female with a past medical history significant for a s/p stent placement, COPD, GERD referred to me for leukocytosis/thrombocytosis.Looking back at her CBCs patient has had chronic leukocytosis at least dating back to 2016 when her white cell count fluctuates widely between 11-20. Differential has mostly show neutrophilia but at times has shown lymphocytosis and monocytosis. Hemoglobin is always been normal but most recently patient was noted to have thrombocytosis as well. Her most recent CBC from 07/18/2019 showed white cell count of 17, H&H of 13.7/40.8 and a platelet count of 550. Patient admits to smoking daily about half a pack per day. Also admits to smoking marijuana occasionally.  Further work-up in March 2021 including flow cytometry did not reveal any immunophenotypic abnormality.  JAK2, CAL R, MPL testing was negative.  BCR ABL FISH testing was negative.  HIV hepatitis B hepatitis C testing negative.  With regards to her easy bruising she underwent PT PTT INR, fibrinogen levels which were normal.  Von Willebrand panel was normal except for mildly elevated factor VIII level of 152%.  Thrombin time was normal.  PFA was not done as patient is  on Plavix.   Interval history: Patient reports early satiety and used to smoke marijuana.  She was diagnosed recently with gastroparesis by GI.   Review of Systems  Constitutional: Positive for malaise/fatigue. Negative for chills, fever and weight loss.  HENT: Negative for congestion, ear discharge and nosebleeds.   Eyes: Negative for blurred vision.  Respiratory: Negative for cough, hemoptysis, sputum production, shortness of breath and wheezing.   Cardiovascular: Negative for chest pain, palpitations, orthopnea and claudication.  Gastrointestinal: Negative for abdominal pain, blood in stool, constipation, diarrhea, heartburn, melena, nausea and vomiting.  Genitourinary: Negative for dysuria, flank pain, frequency, hematuria and urgency.  Musculoskeletal: Negative for back pain, joint pain and myalgias.  Skin: Negative for rash.  Neurological: Negative for dizziness, tingling, focal weakness, seizures, weakness and headaches.  Endo/Heme/Allergies: Does not bruise/bleed easily.  Psychiatric/Behavioral: Negative for depression and suicidal ideas. The patient does not have insomnia.     Allergies  Allergen Reactions  . Ativan [Lorazepam]     Pt states it makes her tongue do weird things   . Latex Other (See Comments)    Patient stated that she was told by her doctor that she is "allergic to" this  . Tape Other (See Comments)    Patient stated that she was told by her doctor that she is "allergic to" this  . Xifaxan [Rifaximin]     Pt believes this is contributing to her abdominal pain/discomfort  . Drixoral Allergy Sinus [Dexbromphen-Pse-Apap Er] Rash    Past Medical History:  Diagnosis Date  . Anxiety   . Asthma   . Bipolar 1 disorder ([place])   . Bipolar disorder ([place])   .  CAD (coronary artery disease)    s/p stent BMS OM Cx  . Cervical herniated disc 04/12/2016  . COPD (chronic obstructive pulmonary disease) ([place])   . Depression   . Diabetes mellitus without complication  ([place])   . Diverticulitis   . GERD (gastroesophageal reflux disease)   . Glaucoma   . History of blood transfusion   . Hyperlipidemia   . Hypertension   . OSA (obstructive sleep apnea)    not using cpap   . Plantar fasciitis    b/l feet s/p steroid shots w/o help and left surgery w/o help   . UTI (urinary tract infection)     Past Surgical History:  Procedure Laterality Date  . ABDOMINAL HYSTERECTOMY    . ABDOMINAL SURGERY  1995   Bowel resection.  Marland Kitchen CARDIAC CATHETERIZATION N/A 04/14/2016   Procedure: Left Heart Cath and Coronary Angiography;  Surgeon: Burnell Blanks, MD;  Location: [facility];  Service: Cardiovascular;  Laterality: N/A;  . CARDIAC SURGERY    . COLONOSCOPY WITH PROPOFOL N/A 07/11/2019   Procedure: COLONOSCOPY WITH PROPOFOL;  Surgeon: Jonathon Bellows, MD;  Location: [facility] ENDOSCOPY;  Service: Gastroenterology;  Laterality: N/A;  . COLONOSCOPY WITH PROPOFOL N/A 07/29/2019   Procedure: COLONOSCOPY WITH PROPOFOL;  Surgeon: Jonathon Bellows, MD;  Location: [facility] ENDOSCOPY;  Service: Gastroenterology;  Laterality: N/A;  . CORONARY ANGIOPLASTY WITH STENT PLACEMENT  2010   Drug eluting stent  . ESOPHAGOGASTRODUODENOSCOPY (EGD) WITH PROPOFOL N/A 07/11/2019   Procedure: ESOPHAGOGASTRODUODENOSCOPY (EGD) WITH PROPOFOL;  Surgeon: Jonathon Bellows, MD;  Location: [facility] ENDOSCOPY;  Service: Gastroenterology;  Laterality: N/A;  . OVARIAN CYST REMOVAL      Social History   Socioeconomic History  . Marital status: Single    Spouse name: Not on file  . Number of children: Not on file  . Years of education: Not on file  . Highest education level: Not on file  Occupational History  . Not on file  Tobacco Use  . Smoking status: Former Smoker    Packs/day: 0.50    Years: 30.00    Pack years: 15.00    Types: Cigarettes    Quit date: 03/22/2020    Years since quitting: 0.4  . Smokeless tobacco: Never Used  . Tobacco comment: refused  Vaping Use  . Vaping Use: Never used   Substance and Sexual Activity  . Alcohol use: Not Currently    Comment: once a year  . Drug use: Yes    Frequency: 7.0 times per week    Types: Marijuana  . Sexual activity: Not on file  Other Topics Concern  . Not on file  Social History Narrative   From [place] now living in [place]    1 son    No guns    Wears seat belt   Safe in relationship    Social Determinants of Health   Financial Resource Strain: Not on file  Food Insecurity: Not on file  Transportation Needs: Not on file  Physical Activity: Not on file  Stress: Not on file  Social Connections: Not on file  Intimate Partner Violence: Not on file    Family History  Problem Relation Age of Onset  . CAD Mother   . Depression Mother   . Heart disease Mother   . Hyperlipidemia Mother   . Hypertension Mother   . CAD Brother   . Depression Brother   . Diabetes Brother   . Heart disease Brother   . Hyperlipidemia  Brother   . Heart disease Father   . Alcohol abuse Father   . Lupus Other   . Sickle cell anemia Other      Current Outpatient Medications:  .  albuterol (PROAIR HFA) 108 (90 Base) MCG/ACT inhaler, Inhale 1-2 puffs into the lungs every 6 (six) hours as needed for wheezing or shortness of breath., Disp: 54 g, Rfl: 3 .  ALPRAZolam (XANAX) 0.25 MG tablet, Take 1 tablet (0.25 mg total) by mouth at bedtime as needed for anxiety or sleep. (Patient not taking: No sig reported), Disp: 30 tablet, Rfl: 2 .  amLODipine (NORVASC) 10 MG tablet, Take 1 tablet (10 mg total) by mouth at bedtime., Disp: 90 tablet, Rfl: 3 .  aspirin EC 81 MG tablet, Take 1 tablet (81 mg total) by mouth daily., Disp: 90 tablet, Rfl: 3 .  atorvastatin (LIPITOR) 80 MG tablet, Take 1 tablet (80 mg total) by mouth daily at 6 PM., Disp: 90 tablet, Rfl: 3 .  chlorpheniramine-HYDROcodone (TUSSIONEX PENNKINETIC ER) 10-8 MG/5ML SUER, Take 5 mLs by mouth 2 (two) times daily. (Patient not taking: No sig reported), Disp: 115 mL, Rfl:  0 .  clopidogrel (PLAVIX) 75 MG tablet, Take 1 tablet (75 mg total) by mouth daily., Disp: 90 tablet, Rfl: 3 .  cyclobenzaprine (FLEXERIL) 5 MG tablet, Take 1 tablet (5 mg total) by mouth at bedtime as needed for muscle spasms., Disp: 90 tablet, Rfl: 3 .  diclofenac Sodium (VOLTAREN) 1 % GEL, Apply 2 g topically 4 (four) times daily. Prn OA and chest wall pain, Disp: 450 g, Rfl: 3 .  divalproex (DEPAKOTE) 500 MG DR tablet, TAKE 1 TABLET BY MOUTH  TWICE DAILY, Disp: 180 tablet, Rfl: 3 .  ezetimibe (ZETIA) 10 MG tablet, Take 1 tablet (10 mg total) by mouth daily. With lipitor 80 mg at night, Disp: 90 tablet, Rfl: 1 .  ipratropium (ATROVENT) 0.06 % nasal spray, Place 2 sprays into both nostrils 3 (three) times daily., Disp: 45 mL, Rfl: 3 .  Ipratropium-Albuterol (COMBIVENT RESPIMAT) 20-100 MCG/ACT AERS respimat, Inhale 1 puff into the lungs every 6 (six) hours as needed for wheezing., Disp: 4 g, Rfl: 0 .  Ipratropium-Albuterol (COMBIVENT RESPIMAT) 20-100 MCG/ACT AERS respimat, Inhale 1 puff into the lungs every 6 (six) hours as needed for wheezing., Disp: 12 g, Rfl: 3 .  ipratropium-albuterol (DUONEB) 0.5-2.5 (3) MG/3ML SOLN, Take 3 mLs by nebulization every 6 (six) hours as needed., Disp: 360 mL, Rfl: 11 .  isosorbide mononitrate (IMDUR) 30 MG 24 hr tablet, TAKE 1 TABLET BY MOUTH  DAILY, Disp: 90 tablet, Rfl: 0 .  levocetirizine (XYZAL) 5 MG tablet, Take 1 tablet (5 mg total) by mouth at bedtime as needed for allergies., Disp: 90 tablet, Rfl: 3 .  lidocaine (XYLOCAINE) 2 % solution, Use as directed 5 mLs in the mouth or throat every 6 (six) hours as needed for mouth pain. For oral swish and swallow., Disp: 100 mL, Rfl: 0 .  mometasone-formoterol (DULERA) 100-5 MCG/ACT AERO, Inhale 2 puffs into the lungs in the morning and at bedtime. Rinse mouth, Disp: 1 each, Rfl: 0 .  nitroGLYCERIN (NITROSTAT) 0.4 MG SL tablet, DISSOLVE 1 TABLET UNDER THE TONGUE EVERY 5 MINUTES AS  NEEDED FOR CHEST PAIN. MAX  OF 3 TABS  IN 15 MINS. CALL  911 AFTER 3RD DOSE, Disp: 25 tablet, Rfl: 0 .  olmesartan-hydrochlorothiazide (BENICAR HCT) 40-25 MG tablet, Take 1 tablet by mouth daily., Disp: 90 tablet, Rfl: 3 .  ondansetron (ZOFRAN  ODT) 4 MG disintegrating tablet, Take 1 tablet (4 mg total) by mouth every 8 (eight) hours as needed for nausea or vomiting., Disp: 270 tablet, Rfl: 3 .  pantoprazole (PROTONIX) 40 MG tablet, TAKE 1 TABLET BY MOUTH  TWICE DAILY BEFORE MEALS, Disp: 180 tablet, Rfl: 3 .  predniSONE (DELTASONE) 10 MG tablet, Take 6 tablets ( total 60 mg) by mouth for 1 day; take 5 tablets ( total 50 mg) by mouth the next day;  and then decrease by 1 tablet ( total 10mg ) every day until off., Disp: 20 tablet, Rfl: 0 .  promethazine (PHENERGAN) 25 MG suppository, Place 1 suppository (25 mg total) rectally every 6 (six) hours as needed for nausea. (Patient not taking: No sig reported), Disp: 12 suppository, Rfl: 1 .  senna-docusate (SENOKOT-S) 8.6-50 MG tablet, Take 1-2 tablets by mouth daily as needed for mild constipation. (Patient not taking: No sig reported), Disp: 90 tablet, Rfl: 3 .  traZODone (DESYREL) 150 MG tablet, Take 1 tablet (150 mg total) by mouth at bedtime as needed for sleep., Disp: 90 tablet, Rfl: 3  No results found.  No images are attached to the encounter.   CMP Latest Ref Rng & Units 05/22/2020  Glucose 70 - 99 mg/dL 133(H)  BUN 6 - 20 mg/dL 20  Creatinine 0.44 - 1.00 mg/dL 0.72  Sodium 135 - 145 mmol/L 137  Potassium 3.5 - 5.1 mmol/L 3.8  Chloride 98 - 111 mmol/L 97(L)  CO2 22 - 32 mmol/L 27  Calcium 8.9 - 10.3 mg/dL 9.3  Total Protein 6.0 - 8.3 g/dL -  Total Bilirubin 0.2 - 1.2 mg/dL -  Alkaline Phos 39 - 117 U/L -  AST 0 - 37 U/L -  ALT 0 - 35 U/L -   CBC Latest Ref Rng & Units 08/16/2020  WBC 4.0 - 10.5 K/uL 13.3(H)  Hemoglobin 12.0 - 15.0 g/dL 13.1  Hematocrit 36.0 - 46.0 % 38.2  Platelets 150 - 400 K/uL 424(H)     Observation/objective: Appears in no acute distress over video  visit today.  Breathing is nonlabored  Assessment and plan: Patient is a 55 year old female with the following issues:  1.  Leukocytosis: Differential is mainly showing neutropenia which is likely secondary to smoking and marijuana use.  It waxes and wanes anywhere between 11-20.  She also has some accompanying thrombocytosis.  There has been no consistent upward trend in her white cell count.  Hemoglobin is presently stable around 13.She has previously had work-up for leukocytosis including BCR ABL testing as well as flow cytometry which was negative.  I am inclined to monitor this conservatively and I will repeat CBC with differential in 6 months in 1 year and see her back in 1 year Follow-up instructions: As above  I discussed the assessment and treatment plan with the patient. The patient was provided an opportunity to ask questions and all were answered. The patient agreed with the plan and demonstrated an understanding of the instructions.   The patient was advised to call back or seek an in-person evaluation if the symptoms worsen or if the condition fails to improve as anticipated.   Visit Diagnosis: 1. Thrombocytosis   2. Neutrophilia     Dr. Randa Evens, MD, MPH [facility] Tel- [phone] 08/21/2020 4:00 PM

## 2020-08-22 NOTE — Telephone Encounter (Signed)
Rx request sent to pharmacy.  

## 2020-08-22 NOTE — Telephone Encounter (Signed)
Scheduled for 2-17

## 2020-08-22 NOTE — Telephone Encounter (Signed)
Please schedule overdue F/U appointment. Thank you! ?

## 2020-08-30 ENCOUNTER — Encounter: Payer: Self-pay | Admitting: Family

## 2020-08-30 ENCOUNTER — Ambulatory Visit (INDEPENDENT_AMBULATORY_CARE_PROVIDER_SITE_OTHER): Payer: Medicare Other | Admitting: Family

## 2020-08-30 ENCOUNTER — Other Ambulatory Visit: Payer: Self-pay

## 2020-08-30 ENCOUNTER — Other Ambulatory Visit
Admission: RE | Admit: 2020-08-30 | Discharge: 2020-08-30 | Disposition: A | Discharge status: Home / Self Care | Payer: Medicare Other | Source: Ambulatory Visit | Attending: Family | Admitting: Family

## 2020-08-30 VITALS — BP 130/64 | HR 91 | Ht 63.0 in | Wt 192.0 lb

## 2020-08-30 DIAGNOSIS — I25110 Atherosclerotic heart disease of native coronary artery with unstable angina pectoris: Secondary | ICD-10-CM | POA: Diagnosis not present

## 2020-08-30 DIAGNOSIS — E785 Hyperlipidemia, unspecified: Secondary | ICD-10-CM | POA: Insufficient documentation

## 2020-08-30 DIAGNOSIS — I10 Essential (primary) hypertension: Secondary | ICD-10-CM

## 2020-08-30 DIAGNOSIS — G4733 Obstructive sleep apnea (adult) (pediatric): Secondary | ICD-10-CM | POA: Diagnosis not present

## 2020-08-30 DIAGNOSIS — Z720 Tobacco use: Secondary | ICD-10-CM | POA: Diagnosis not present

## 2020-08-30 LAB — LIPID PANEL
Cholesterol: 230 mg/dL — ABNORMAL HIGH (ref 0–200)
HDL: 52 mg/dL (ref >40)
LDL Cholesterol: 145 mg/dL — ABNORMAL HIGH (ref 0–99)
Total CHOL/HDL Ratio: 4.4 RATIO
Triglycerides: 166 mg/dL — ABNORMAL HIGH (ref <150)
VLDL: 33 mg/dL (ref 0–40)

## 2020-08-30 LAB — TROPONIN I (HIGH SENSITIVITY): Troponin I (High Sensitivity): 13 ng/L (ref <18)

## 2020-08-30 LAB — COMPREHENSIVE METABOLIC PANEL
ALT: 54 U/L — ABNORMAL HIGH (ref 0–44)
AST: 19 U/L (ref 15–41)
Albumin: 4 g/dL (ref 3.5–5.0)
Alkaline Phosphatase: 76 U/L (ref 38–126)
Anion gap: 10 (ref 5–15)
BUN: 22 mg/dL — ABNORMAL HIGH (ref 6–20)
CO2: 29 mmol/L (ref 22–32)
Calcium: 9.7 mg/dL (ref 8.9–10.3)
Chloride: 101 mmol/L (ref 98–111)
Creatinine, Ser: 0.68 mg/dL (ref 0.44–1.00)
GFR, Estimated: >60 mL/min (ref >60)
Glucose, Bld: 127 mg/dL — ABNORMAL HIGH (ref 70–99)
Potassium: 4.1 mmol/L (ref 3.5–5.1)
Sodium: 140 mmol/L (ref 135–145)
Total Bilirubin: 0.4 mg/dL (ref 0.3–1.2)
Total Protein: 7.1 g/dL (ref 6.5–8.1)

## 2020-08-30 LAB — LDL CHOLESTEROL, DIRECT: Direct LDL: 148 mg/dL — ABNORMAL HIGH (ref 0–99)

## 2020-08-30 MED ORDER — ISOSORBIDE MONONITRATE ER 60 MG PO TB24: 60.0000 mg | ORAL_TABLET | Freq: Every day | ORAL | 1 refills | Status: DC

## 2020-08-30 NOTE — Patient Instructions (Addendum)
Medication Instructions:  Your physician has recommended you make the following change in your medication:   CHANGE Isosorbide Mononitrate (Imdur) to 60mg  daily  *If you need a refill on your cardiac medications before your next appointment, please call your pharmacy*  Lab Work: Your provider recommends lab work today: troponin, CMP, lipid panel, direct LDL -  Please go to the [facility]. You will check in at the front desk to the right as you walk into the atrium.   If you have labs (blood work) drawn today and your tests are completely normal, you will receive your results only by: Marland Kitchen MyChart Message (if you have MyChart) OR . A paper copy in the mail If you have any lab test that is abnormal or we need to change your treatment, we will call you to review the results.  Testing/Procedures: Your EKG today showed normal sinus rhythm.         Cardiac Catheterization  You are scheduled for a Cardiac Catheterization on Tuesday, February 22 with Dr. Harrell Gave End.  1. Please arrive at the [place] [facility] at 7:30 AM (This time is two hours before your procedure to ensure your preparation). Free valet parking service is available.   Special note: Every effort is made to have your procedure done on time. Please understand that emergencies sometimes delay scheduled procedures.  2. Diet: Do not eat solid foods after midnight.  The patient may have clear liquids until 5am upon the day of the procedure.  3. Labs: You will have lab work done today at the [place]: Cmet, CBC, Troponin, Lipid, Direct LDL  4. Medication instructions in preparation for your procedure:   Contrast Allergy: No   On the morning of your procedure, take your Aspirin and Plavix/Clopidogrel and any morning medicines NOT listed above.  You may use sips of water.  5. Plan for one night stay--bring personal belongings. 6. Bring a current list of your medications and current insurance cards. 7. You MUST  have a responsible person to drive you home. 8. Someone MUST be with you the first 24 hours after you arrive home or your discharge will be delayed. 9. Please wear clothes that are easy to get on and off and wear slip-on shoes.  Thank you for allowing [place] to care for you!   -- [facility]   COVID PRE- TEST: You will need a COVID TEST prior to the procedure:  LOCATION: [place] [facility]-Thru Testing site.  DATE/TIME:  08/31/20 (anytime between 8 am and 1 pm)  Follow-Up: At [facility], you and your health needs are our priority.  As part of our continuing mission to provide you with exceptional heart care, we have created designated Provider Care Teams.  These Care Teams include your primary Cardiologist (physician) and Advanced Practice Providers (APPs -  Physician Assistants and Nurse Practitioners) who all work together to provide you with the care you need, when you need it.  We recommend signing up for the patient portal called "MyChart".  Sign up information is provided on this After Visit Summary.  MyChart is used to connect with patients for Virtual Visits (Telemedicine).  Patients are able to view lab/test results, encounter notes, upcoming appointments, etc.  Non-urgent messages can be sent to your provider as well.   To learn more about what you can do with MyChart, go to [email or web address].    Your next appointment:   3 week(s)   The  format for your next appointment:   In Person  Provider:   You may see Nelva Bush, MD or one of the following Advanced Practice Providers on your designated Care Team:    Murray Hodgkins, NP  Christell Faith, PA-C  Marrianne Mood, PA-C  Cadence [place], [place]  Laurann Montana, NP

## 2020-08-30 NOTE — Progress Notes (Signed)
Office Visit    Patient Name: [name] Date of Encounter: 08/30/2020  Primary Care Provider:  McLean-Scocuzza, Nino Glow, MD Primary Cardiologist:  Nelva Bush, MD Electrophysiologist:  None   Chief Complaint    Shawna Anderson is a 55 y.o. female with a hx of CAD (2010 BMS to LCx: 2017 restenosis of LCx), HTN, HLD, GERD, sleep apnea, asthma, current smoker with both tobacco and marijuana, bipolar/anxiety presents today for CAD follow-up  Past Medical History    Past Medical History:  Diagnosis Date  . Anxiety   . Asthma   . Bipolar 1 disorder ([place])   . Bipolar disorder ([place])   . CAD (coronary artery disease)    s/p stent BMS OM Cx  . Cervical herniated disc 04/12/2016  . COPD (chronic obstructive pulmonary disease) ([place])   . Depression   . Diabetes mellitus without complication ([place])   . Diverticulitis   . GERD (gastroesophageal reflux disease)   . Glaucoma   . History of blood transfusion   . Hyperlipidemia   . Hypertension   . OSA (obstructive sleep apnea)    not using cpap   . Plantar fasciitis    b/l feet s/p steroid shots w/o help and left surgery w/o help   . UTI (urinary tract infection)    Past Surgical History:  Procedure Laterality Date  . ABDOMINAL HYSTERECTOMY    . ABDOMINAL SURGERY  1995   Bowel resection.  Marland Kitchen CARDIAC CATHETERIZATION N/A 04/14/2016   Procedure: Left Heart Cath and Coronary Angiography;  Surgeon: Burnell Blanks, MD;  Location: [facility];  Service: Cardiovascular;  Laterality: N/A;  . CARDIAC SURGERY    . COLONOSCOPY WITH PROPOFOL N/A 07/11/2019   Procedure: COLONOSCOPY WITH PROPOFOL;  Surgeon: Jonathon Bellows, MD;  Location: [facility] ENDOSCOPY;  Service: Gastroenterology;  Laterality: N/A;  . COLONOSCOPY WITH PROPOFOL N/A 07/29/2019   Procedure: COLONOSCOPY WITH PROPOFOL;  Surgeon: Jonathon Bellows, MD;  Location: [facility] ENDOSCOPY;  Service: Gastroenterology;  Laterality: N/A;  . CORONARY ANGIOPLASTY WITH STENT  PLACEMENT  2010   Drug eluting stent  . ESOPHAGOGASTRODUODENOSCOPY (EGD) WITH PROPOFOL N/A 07/11/2019   Procedure: ESOPHAGOGASTRODUODENOSCOPY (EGD) WITH PROPOFOL;  Surgeon: Jonathon Bellows, MD;  Location: [facility] ENDOSCOPY;  Service: Gastroenterology;  Laterality: N/A;  . OVARIAN CYST REMOVAL      Allergies  Allergies  Allergen Reactions  . Ativan [Lorazepam]     Pt states it makes her tongue do weird things   . Latex Other (See Comments)    Patient stated that she was told by her doctor that she is "allergic to" this  . Tape Other (See Comments)    Patient stated that she was told by her doctor that she is "allergic to" this  . Xifaxan [Rifaximin]     Pt believes this is contributing to her abdominal pain/discomfort  . Drixoral Allergy Sinus [Dexbromphen-Pse-Apap Er] Rash    History of Present Illness    Shawna Anderson is a 55 y.o. female with a hx of CAD (2010 BMS to LCx: 2017 restenosis of LCx), HTN, HLD, GERD, sleep apnea, asthma, current smoker with both tobacco and marijuana, bipolar/anxiety last seen 01/31/20.  CAD was initially discovered during preop work-up for prior ovarian cyst removal in 2010. BMS placed ostial/proximal LCx. She was admitted to [facility] 03/1016 for chest pain after being off multiple medications due to financial constraints. Catheterization showed moderate in-stent restenosis of the ostial/proximal left circumflex stent. Recommended for medical management. She  has had multiple office visits since that time reporting stable anginal symptoms with continued medication adjustments. She does use nitroglycerin as needed. Imdur 30 mg daily was added to her office visit in December due to anginal symptoms and cardiac catheterization was offered though she declined.  Seen in follow up 01/31/20. Noted to have been seen by Dr. Vicente Males of GI the day prior and recommended for barium swallow due to dysphagia. She endorsed reduced frequency of chest pain since addition of Imdur.   Continued to smoke, but had purchased nicotine patches and is planning to quit. BP at home has been checked intermittently and labile though noted taking medication at very different times each day.  She had a gastric emptying study which was slightly delayed. Continues to follow with GI.  Reports episode of her typical chest pain once this month and once last month. Tells me when it happened her blood pressure was 210/110. They resolved with 1-2 nitroglycerin. She takes them about 15 minutes apart. She however, endorses a new "constant pain" when she does exertion in her upper chest with exertion over the last few months. Tells me it runs down both of her arms. It has been going on for months. Tells me it stays about the same. Tells me it feels different than her previous chest discomfort as it runs down both of her arms. Reports no heavy lifting nor injury. She has tried Voltaren gel, Tylenol, ibuprofen with no relief. It is associated with shortness of breath.   Checking BP routinely at home and it is routinely 138/98. Heart rate 80s-100s. She got one low blood pressure reading but it was an isolated episode and overall asymptomatic.   EKGs/Labs/Other Studies Reviewed:   The following studies were reviewed today:   Echo 03/16/2019  1. The left ventricle has normal systolic function with an ejection fraction of 60-65%. The cavity size was normal. Left ventricular diastolic Doppler parameters are consistent with impaired relaxation.  2. The right ventricle has normal systolic function. The cavity was normal. There is no increase in right ventricular wall thickness.Unable to estimate RVSP.   04/2016 LHC  Mid RCA lesion, 30 %stenosed.  Ost RPDA to RPDA lesion, 20 %stenosed.  Mid RCA to Dist RCA lesion, 10 %stenosed.  Ost Cx to Prox Cx lesion, 50 %stenosed.  Prox LAD to Mid LAD lesion, 20 %stenosed.  The left ventricular ejection fraction is greater than 65% by visual estimate.  The left  ventricular systolic function is normal.  LV end diastolic pressure is normal.  There is no mitral valve regurgitation.   1. Single vessel CAD  2. Patent stent in the ostium of the Circumflex with moderate restenosis. This does not appear to be flow limiting. 3. Mild non-obstructive disease in the RCA and LAD.  4. Normal LV function  EKG:  EKG is ordered today.  The ekg ordered today demonstrates NSR 91 bpm with new TWI to lateral leads.  Recent Labs: 11/15/2019: Magnesium 2.3 08/16/2020: Hemoglobin 13.1; Platelets 424 08/30/2020: ALT 54; BUN 22; Creatinine, Ser 0.68; Potassium 4.1; Sodium 140  Recent Lipid Panel    Component Value Date/Time   CHOL 230 (H) 08/30/2020 1112   TRIG 166 (H) 08/30/2020 1112   HDL 52 08/30/2020 1112   CHOLHDL 4.4 08/30/2020 1112   VLDL 33 08/30/2020 1112   LDLCALC 145 (H) 08/30/2020 1112   LDLCALC 193 (H) 12/31/2017 0905   LDLDIRECT 159.0 12/24/2018 1108    Home Medications   Current Meds  Medication  Sig  . albuterol (PROAIR HFA) 108 (90 Base) MCG/ACT inhaler Inhale 1-2 puffs into the lungs every 6 (six) hours as needed for wheezing or shortness of breath.  Marland Kitchen amLODipine (NORVASC) 10 MG tablet Take 1 tablet (10 mg total) by mouth at bedtime.  Marland Kitchen aspirin EC 81 MG tablet Take 1 tablet (81 mg total) by mouth daily.  Marland Kitchen atorvastatin (LIPITOR) 80 MG tablet Take 1 tablet (80 mg total) by mouth daily at 6 PM.  . clopidogrel (PLAVIX) 75 MG tablet Take 1 tablet (75 mg total) by mouth daily.  . diclofenac Sodium (VOLTAREN) 1 % GEL Apply 2 g topically 4 (four) times daily. Prn OA and chest wall pain  . divalproex (DEPAKOTE) 500 MG DR tablet TAKE 1 TABLET BY MOUTH  TWICE DAILY  . ezetimibe (ZETIA) 10 MG tablet Take 1 tablet (10 mg total) by mouth daily. With lipitor 80 mg at night  . ipratropium (ATROVENT) 0.06 % nasal spray Place 2 sprays into both nostrils 3 (three) times daily.  . Ipratropium-Albuterol (COMBIVENT RESPIMAT) 20-100 MCG/ACT AERS respimat Inhale 1  puff into the lungs every 6 (six) hours as needed for wheezing.  Marland Kitchen ipratropium-albuterol (DUONEB) 0.5-2.5 (3) MG/3ML SOLN Take 3 mLs by nebulization every 6 (six) hours as needed.  Marland Kitchen levocetirizine (XYZAL) 5 MG tablet Take 1 tablet (5 mg total) by mouth at bedtime as needed for allergies.  Marland Kitchen lidocaine (XYLOCAINE) 2 % solution Use as directed 5 mLs in the mouth or throat every 6 (six) hours as needed for mouth pain. For oral swish and swallow.  . mometasone-formoterol (DULERA) 100-5 MCG/ACT AERO Inhale 2 puffs into the lungs in the morning and at bedtime. Rinse mouth  . nitroGLYCERIN (NITROSTAT) 0.4 MG SL tablet DISSOLVE 1 TABLET UNDER THE TONGUE EVERY 5 MINUTES AS  NEEDED FOR CHEST PAIN. MAX  OF 3 TABS IN 15 MINS. CALL  911 AFTER 3RD DOSE  . olmesartan-hydrochlorothiazide (BENICAR HCT) 40-25 MG tablet Take 1 tablet by mouth daily.  . ondansetron (ZOFRAN ODT) 4 MG disintegrating tablet Take 1 tablet (4 mg total) by mouth every 8 (eight) hours as needed for nausea or vomiting.  . pantoprazole (PROTONIX) 40 MG tablet TAKE 1 TABLET BY MOUTH  TWICE DAILY BEFORE MEALS  . predniSONE (DELTASONE) 10 MG tablet Take 6 tablets ( total 60 mg) by mouth for 1 day; take 5 tablets ( total 50 mg) by mouth the next day;  and then decrease by 1 tablet ( total 10mg ) every day until off.  . promethazine (PHENERGAN) 25 MG suppository Place 1 suppository (25 mg total) rectally every 6 (six) hours as needed for nausea.  . traZODone (DESYREL) 150 MG tablet Take 1 tablet (150 mg total) by mouth at bedtime as needed for sleep.  . [DISCONTINUED] isosorbide mononitrate (IMDUR) 30 MG 24 hr tablet Take 1 tablet (30 mg total) by mouth daily. Please keep your upcoming appointment for refills.  . [DISCONTINUED] isosorbide mononitrate (IMDUR) 60 MG 24 hr tablet Take 1 tablet (60 mg total) by mouth daily.    Review of Systems    Review of Systems  Constitutional: Negative for chills, fever and malaise/fatigue.  Cardiovascular:  Positive for chest pain and dyspnea on exertion. Negative for irregular heartbeat, leg swelling, near-syncope, orthopnea, palpitations and syncope.  Respiratory: Negative for cough, shortness of breath and wheezing.   Gastrointestinal: Negative for melena, nausea and vomiting.  Genitourinary: Negative for hematuria.  Neurological: Negative for dizziness, light-headedness and weakness.   All other systems  reviewed and are otherwise negative except as noted above.  Physical Exam    VS:  BP 130/64 (BP Location: Left Arm, Patient Position: Sitting, Cuff Size: Normal)   Pulse 91   Ht 5\' 3"  (1.6 m)   Wt 192 lb (87.1 kg)   SpO2 97%   BMI 34.01 kg/m  , BMI Body mass index is 34.01 kg/m. GEN: Well nourished, overweight,  well developed, in no acute distress. HEENT: normal. Neck: Supple, no JVD, carotid bruits, or masses. Cardiac: RRR, no murmurs, rubs, or gallops. No clubbing, cyanosis, edema.  Radials/DP/PT 2+ and equal bilaterally.  Respiratory:  Respirations regular and unlabored, clear to auscultation bilaterally. GI: Soft, nontender, nondistended, BS + x 4. MS: No deformity or atrophy. Skin: Warm and dry, no rash. Neuro:  Strength and sensation are intact. Psych: Normal affect.  Assessment & Plan    1. CAD -PCI to LCx 2010 and moderate restenosis 2017 recommended for medical management. Low risk Lexiscan 02/2019. Endorses new chest discomfort across her chest with minimal exertion that is worsening. Endorses increased frequency of utilizing PRN Nitroglycerin. EKG today with new TWI. Increase Imdur to 60mg  daily. Plan for cardiac catheterization to rule out worsening ischemic. High sensitivity troponin today unremarkable, no indication for ED evaluation at this time. The risks [stroke (1 in 1000), death (1 in 22), kidney failure [usually temporary] (1 in 500), bleeding (1 in 200), allergic reaction [possibly serious] (1 in 200)], benefits (diagnostic support and management of coronary  artery disease) and alternatives of a cardiac catheterization were discussed in detail with Ms. Badalamenti and she is willing to proceed. BMP today.   2. Anemia - Continue to follow with hematology. 08/16/20 Hb 13.1.   3. HTN - BP well controlled. For improved control of chest pain, increase Imdur to 60mg  daily as above.   4. HLD, LDL goal less than 70 - 06/2019 LDL 149. On Atorvastatin 80mg  and Zetia 10mg  daily. Repeat lipid panel today with total cholesterol 230, HDL 52, LDL 145. Low suspicion she is taking Atorvastatin and Zetia as prescribed. Ultimately would benefit from PCSK9i, but politely declines.   5. Tobacco use -  Smoking cessation encouraged. Recommend utilization of [id number].  6. DM2 - Continue to follow with PCP.  7. OSA not on CPAP - Discussed contribution to HTN. Continues to decline CPAP.   Disposition: Left heart catheterization 09/04/20 with Dr. Saunders Revel.Follow up in 3 week(s) with Dr. Saunders Revel or APP   Loel Dubonnet, NP 08/30/2020, 12:53 PM

## 2020-08-30 NOTE — H&P (View-Only) (Signed)
Office Visit    Patient Name: Shawna Anderson Date of Encounter: 08/30/2020  Primary Care Provider:  McLean-Scocuzza, Nino Glow, MD Primary Cardiologist:  Nelva Bush, MD Electrophysiologist:  None   Chief Complaint    Shawna Anderson is a 55 y.o. female with a hx of CAD (2010 BMS to LCx: 2017 restenosis of LCx), HTN, HLD, GERD, sleep apnea, asthma, current smoker with both tobacco and marijuana, bipolar/anxiety presents today for CAD follow-up  Past Medical History    Past Medical History:  Diagnosis Date  . Anxiety   . Asthma   . Bipolar 1 disorder ([place])   . Bipolar disorder ([place])   . CAD (coronary artery disease)    s/p stent BMS OM Cx  . Cervical herniated disc 04/12/2016  . COPD (chronic obstructive pulmonary disease) ([place])   . Depression   . Diabetes mellitus without complication ([place])   . Diverticulitis   . GERD (gastroesophageal reflux disease)   . Glaucoma   . History of blood transfusion   . Hyperlipidemia   . Hypertension   . OSA (obstructive sleep apnea)    not using cpap   . Plantar fasciitis    b/l feet s/p steroid shots w/o help and left surgery w/o help   . UTI (urinary tract infection)    Past Surgical History:  Procedure Laterality Date  . ABDOMINAL HYSTERECTOMY    . ABDOMINAL SURGERY  1995   Bowel resection.  Marland Kitchen CARDIAC CATHETERIZATION N/A 04/14/2016   Procedure: Left Heart Cath and Coronary Angiography;  Surgeon: Burnell Blanks, MD;  Location: [facility];  Service: Cardiovascular;  Laterality: N/A;  . CARDIAC SURGERY    . COLONOSCOPY WITH PROPOFOL N/A 07/11/2019   Procedure: COLONOSCOPY WITH PROPOFOL;  Surgeon: Jonathon Bellows, MD;  Location: [facility] ENDOSCOPY;  Service: Gastroenterology;  Laterality: N/A;  . COLONOSCOPY WITH PROPOFOL N/A 07/29/2019   Procedure: COLONOSCOPY WITH PROPOFOL;  Surgeon: Jonathon Bellows, MD;  Location: [facility] ENDOSCOPY;  Service: Gastroenterology;  Laterality: N/A;  . CORONARY ANGIOPLASTY WITH STENT  PLACEMENT  2010   Drug eluting stent  . ESOPHAGOGASTRODUODENOSCOPY (EGD) WITH PROPOFOL N/A 07/11/2019   Procedure: ESOPHAGOGASTRODUODENOSCOPY (EGD) WITH PROPOFOL;  Surgeon: Jonathon Bellows, MD;  Location: [facility] ENDOSCOPY;  Service: Gastroenterology;  Laterality: N/A;  . OVARIAN CYST REMOVAL      Allergies  Allergies  Allergen Reactions  . Ativan [Lorazepam]     Pt states it makes her tongue do weird things   . Latex Other (See Comments)    Patient stated that she was told by her doctor that she is "allergic to" this  . Tape Other (See Comments)    Patient stated that she was told by her doctor that she is "allergic to" this  . Xifaxan [Rifaximin]     Pt believes this is contributing to her abdominal pain/discomfort  . Drixoral Allergy Sinus [Dexbromphen-Pse-Apap Er] Rash    History of Present Illness    Shawna Anderson is a 55 y.o. female with a hx of CAD (2010 BMS to LCx: 2017 restenosis of LCx), HTN, HLD, GERD, sleep apnea, asthma, current smoker with both tobacco and marijuana, bipolar/anxiety last seen 01/31/20.  CAD was initially discovered during preop work-up for prior ovarian cyst removal in 2010. BMS placed ostial/proximal LCx. She was admitted to [facility] 03/1016 for chest pain after being off multiple medications due to financial constraints. Catheterization showed moderate in-stent restenosis of the ostial/proximal left circumflex stent. Recommended for medical management. She  has had multiple office visits since that time reporting stable anginal symptoms with continued medication adjustments. She does use nitroglycerin as needed. Imdur 30 mg daily was added to her office visit in December due to anginal symptoms and cardiac catheterization was offered though she declined.  Seen in follow up 01/31/20. Noted to have been seen by Dr. Vicente Males of GI the day prior and recommended for barium swallow due to dysphagia. She endorsed reduced frequency of chest pain since addition of Imdur.   Continued to smoke, but had purchased nicotine patches and is planning to quit. BP at home has been checked intermittently and labile though noted taking medication at very different times each day.  She had a gastric emptying study which was slightly delayed. Continues to follow with GI.  Reports episode of her typical chest pain once this month and once last month. Tells me when it happened her blood pressure was 210/110. They resolved with 1-2 nitroglycerin. She takes them about 15 minutes apart. She however, endorses a new "constant pain" when she does exertion in her upper chest with exertion over the last few months. Tells me it runs down both of her arms. It has been going on for months. Tells me it stays about the same. Tells me it feels different than her previous chest discomfort as it runs down both of her arms. Reports no heavy lifting nor injury. She has tried Voltaren gel, Tylenol, ibuprofen with no relief. It is associated with shortness of breath.   Checking BP routinely at home and it is routinely 138/98. Heart rate 80s-100s. She got one low blood pressure reading but it was an isolated episode and overall asymptomatic.   EKGs/Labs/Other Studies Reviewed:   The following studies were reviewed today:   Echo 03/16/2019  1. The left ventricle has normal systolic function with an ejection fraction of 60-65%. The cavity size was normal. Left ventricular diastolic Doppler parameters are consistent with impaired relaxation.  2. The right ventricle has normal systolic function. The cavity was normal. There is no increase in right ventricular wall thickness.Unable to estimate RVSP.   04/2016 LHC  Mid RCA lesion, 30 %stenosed.  Ost RPDA to RPDA lesion, 20 %stenosed.  Mid RCA to Dist RCA lesion, 10 %stenosed.  Ost Cx to Prox Cx lesion, 50 %stenosed.  Prox LAD to Mid LAD lesion, 20 %stenosed.  The left ventricular ejection fraction is greater than 65% by visual estimate.  The left  ventricular systolic function is normal.  LV end diastolic pressure is normal.  There is no mitral valve regurgitation.   1. Single vessel CAD  2. Patent stent in the ostium of the Circumflex with moderate restenosis. This does not appear to be flow limiting. 3. Mild non-obstructive disease in the RCA and LAD.  4. Normal LV function  EKG:  EKG is ordered today.  The ekg ordered today demonstrates NSR 91 bpm with new TWI to lateral leads.  Recent Labs: 11/15/2019: Magnesium 2.3 08/16/2020: Hemoglobin 13.1; Platelets 424 08/30/2020: ALT 54; BUN 22; Creatinine, Ser 0.68; Potassium 4.1; Sodium 140  Recent Lipid Panel    Component Value Date/Time   CHOL 230 (H) 08/30/2020 1112   TRIG 166 (H) 08/30/2020 1112   HDL 52 08/30/2020 1112   CHOLHDL 4.4 08/30/2020 1112   VLDL 33 08/30/2020 1112   LDLCALC 145 (H) 08/30/2020 1112   LDLCALC 193 (H) 12/31/2017 0905   LDLDIRECT 159.0 12/24/2018 1108    Home Medications   Current Meds  Medication  Sig  . albuterol (PROAIR HFA) 108 (90 Base) MCG/ACT inhaler Inhale 1-2 puffs into the lungs every 6 (six) hours as needed for wheezing or shortness of breath.  Marland Kitchen amLODipine (NORVASC) 10 MG tablet Take 1 tablet (10 mg total) by mouth at bedtime.  Marland Kitchen aspirin EC 81 MG tablet Take 1 tablet (81 mg total) by mouth daily.  Marland Kitchen atorvastatin (LIPITOR) 80 MG tablet Take 1 tablet (80 mg total) by mouth daily at 6 PM.  . clopidogrel (PLAVIX) 75 MG tablet Take 1 tablet (75 mg total) by mouth daily.  . diclofenac Sodium (VOLTAREN) 1 % GEL Apply 2 g topically 4 (four) times daily. Prn OA and chest wall pain  . divalproex (DEPAKOTE) 500 MG DR tablet TAKE 1 TABLET BY MOUTH  TWICE DAILY  . ezetimibe (ZETIA) 10 MG tablet Take 1 tablet (10 mg total) by mouth daily. With lipitor 80 mg at night  . ipratropium (ATROVENT) 0.06 % nasal spray Place 2 sprays into both nostrils 3 (three) times daily.  . Ipratropium-Albuterol (COMBIVENT RESPIMAT) 20-100 MCG/ACT AERS respimat Inhale 1  puff into the lungs every 6 (six) hours as needed for wheezing.  Marland Kitchen ipratropium-albuterol (DUONEB) 0.5-2.5 (3) MG/3ML SOLN Take 3 mLs by nebulization every 6 (six) hours as needed.  Marland Kitchen levocetirizine (XYZAL) 5 MG tablet Take 1 tablet (5 mg total) by mouth at bedtime as needed for allergies.  Marland Kitchen lidocaine (XYLOCAINE) 2 % solution Use as directed 5 mLs in the mouth or throat every 6 (six) hours as needed for mouth pain. For oral swish and swallow.  . mometasone-formoterol (DULERA) 100-5 MCG/ACT AERO Inhale 2 puffs into the lungs in the morning and at bedtime. Rinse mouth  . nitroGLYCERIN (NITROSTAT) 0.4 MG SL tablet DISSOLVE 1 TABLET UNDER THE TONGUE EVERY 5 MINUTES AS  NEEDED FOR CHEST PAIN. MAX  OF 3 TABS IN 15 MINS. CALL  911 AFTER 3RD DOSE  . olmesartan-hydrochlorothiazide (BENICAR HCT) 40-25 MG tablet Take 1 tablet by mouth daily.  . ondansetron (ZOFRAN ODT) 4 MG disintegrating tablet Take 1 tablet (4 mg total) by mouth every 8 (eight) hours as needed for nausea or vomiting.  . pantoprazole (PROTONIX) 40 MG tablet TAKE 1 TABLET BY MOUTH  TWICE DAILY BEFORE MEALS  . predniSONE (DELTASONE) 10 MG tablet Take 6 tablets ( total 60 mg) by mouth for 1 day; take 5 tablets ( total 50 mg) by mouth the next day;  and then decrease by 1 tablet ( total 10mg ) every day until off.  . promethazine (PHENERGAN) 25 MG suppository Place 1 suppository (25 mg total) rectally every 6 (six) hours as needed for nausea.  . traZODone (DESYREL) 150 MG tablet Take 1 tablet (150 mg total) by mouth at bedtime as needed for sleep.  . [DISCONTINUED] isosorbide mononitrate (IMDUR) 30 MG 24 hr tablet Take 1 tablet (30 mg total) by mouth daily. Please keep your upcoming appointment for refills.  . [DISCONTINUED] isosorbide mononitrate (IMDUR) 60 MG 24 hr tablet Take 1 tablet (60 mg total) by mouth daily.    Review of Systems    Review of Systems  Constitutional: Negative for chills, fever and malaise/fatigue.  Cardiovascular:  Positive for chest pain and dyspnea on exertion. Negative for irregular heartbeat, leg swelling, near-syncope, orthopnea, palpitations and syncope.  Respiratory: Negative for cough, shortness of breath and wheezing.   Gastrointestinal: Negative for melena, nausea and vomiting.  Genitourinary: Negative for hematuria.  Neurological: Negative for dizziness, light-headedness and weakness.   All other systems  reviewed and are otherwise negative except as noted above.  Physical Exam    VS:  BP 130/64 (BP Location: Left Arm, Patient Position: Sitting, Cuff Size: Normal)   Pulse 91   Ht 5\' 3"  (1.6 m)   Wt 192 lb (87.1 kg)   SpO2 97%   BMI 34.01 kg/m  , BMI Body mass index is 34.01 kg/m. GEN: Well nourished, overweight,  well developed, in no acute distress. HEENT: normal. Neck: Supple, no JVD, carotid bruits, or masses. Cardiac: RRR, no murmurs, rubs, or gallops. No clubbing, cyanosis, edema.  Radials/DP/PT 2+ and equal bilaterally.  Respiratory:  Respirations regular and unlabored, clear to auscultation bilaterally. GI: Soft, nontender, nondistended, BS + x 4. MS: No deformity or atrophy. Skin: Warm and dry, no rash. Neuro:  Strength and sensation are intact. Psych: Normal affect.  Assessment & Plan    1. CAD -PCI to LCx 2010 and moderate restenosis 2017 recommended for medical management. Low risk Lexiscan 02/2019. Endorses new chest discomfort across her chest with minimal exertion that is worsening. Endorses increased frequency of utilizing PRN Nitroglycerin. EKG today with new TWI. Increase Imdur to 60mg  daily. Plan for cardiac catheterization to rule out worsening ischemic. High sensitivity troponin today unremarkable, no indication for ED evaluation at this time. The risks [stroke (1 in 1000), death (1 in 40), kidney failure [usually temporary] (1 in 500), bleeding (1 in 200), allergic reaction [possibly serious] (1 in 200)], benefits (diagnostic support and management of coronary  artery disease) and alternatives of a cardiac catheterization were discussed in detail with Shawna Anderson and she is willing to proceed. BMP today.   2. Anemia - Continue to follow with hematology. 08/16/20 Hb 13.1.   3. HTN - BP well controlled. For improved control of chest pain, increase Imdur to 60mg  daily as above.   4. HLD, LDL goal less than 70 - 06/2019 LDL 149. On Atorvastatin 80mg  and Zetia 10mg  daily. Repeat lipid panel today with total cholesterol 230, HDL 52, LDL 145. Low suspicion she is taking Atorvastatin and Zetia as prescribed. Ultimately would benefit from PCSK9i, but politely declines.   5. Tobacco use -  Smoking cessation encouraged. Recommend utilization of [id number].  6. DM2 - Continue to follow with PCP.  7. OSA not on CPAP - Discussed contribution to HTN. Continues to decline CPAP.   Disposition: Left heart catheterization 09/04/20 with Dr. Saunders Revel.Follow up in 3 week(s) with Dr. Saunders Revel or APP   Loel Dubonnet, NP 08/30/2020, 12:53 PM

## 2020-08-31 ENCOUNTER — Encounter: Payer: Self-pay | Admitting: Family

## 2020-08-31 ENCOUNTER — Telehealth: Payer: Self-pay | Admitting: Family

## 2020-08-31 ENCOUNTER — Telehealth: Payer: Self-pay | Admitting: *Deleted

## 2020-08-31 ENCOUNTER — Other Ambulatory Visit: Payer: Medicare Other

## 2020-08-31 DIAGNOSIS — E785 Hyperlipidemia, unspecified: Secondary | ICD-10-CM

## 2020-08-31 NOTE — Telephone Encounter (Signed)
See telephone encounter from today 2/18. Was able to r/s Covid test for Monday 2/21, day before cath procedure.  Pt aware needs to have test done as early as possible and must have completed to have cath.  May proceed with cath on 2/22 if she can get Covid test 2/21.

## 2020-08-31 NOTE — Telephone Encounter (Signed)
Spoke to pt. After rescheduling Covid test for Monday 2/21, pt states that she is unable to schedule transportation that short in advance.  States she will need 3 days notice to schedule appointments.  Cath has been rescheduled for 09/11/20 with Dr. Saunders Revel at [facility] Pt will arrive at 0730 for 0830 procedure.  Covid test rescheduled for 09/07/20.  Pt confirms that she will be able to schedule transportation for new dates.

## 2020-08-31 NOTE — Telephone Encounter (Signed)
Spoke to pt. Notified of lab results and provider's recc.  Pt verbalized understanding.  Does confirm that she has been taking Atorvastatin 80mg  daily and Zetia 10mg  daily as prescribed.  Pt is agreeable to lipid clinic referral. Referral placed in orders.  Pt will proceed with cardiac cath schedueld 2/22.  She was unable to get transportation to have Covid test that was scheduled today.  So we have rescheduled Covid test to be done Monday morning. Pt aware test must be completed to have cath.

## 2020-08-31 NOTE — Telephone Encounter (Signed)
Patient states she was unable to get her COVID testing and needs to reschedule her catherization scheduled for 2/22. Please call to discuss.

## 2020-08-31 NOTE — Telephone Encounter (Signed)
-----   Message from Loel Dubonnet, NP sent at 08/30/2020  1:15 PM EST ----- High sensitivity troponin unremarkable, plan for cardiac catheterization 09/04/20 as scheduled. Normal kidney function and electrolytes. Liver enzymes mildly elevated and stable compared to previous. Recommend avoidance of alcohol and Acetaminophen. Lipid panel shows LDL significantly above goal of <70. Please ensure she is taking Atorvastatin 80mg  daily and Zetia 10mg  daily as prescribed. Recommend referral to lipid clinic.

## 2020-09-03 ENCOUNTER — Inpatient Hospital Stay: Admission: RE | Admit: 2020-09-03 | Payer: Medicare Other | Source: Ambulatory Visit

## 2020-09-04 DIAGNOSIS — R079 Chest pain, unspecified: Secondary | ICD-10-CM

## 2020-09-07 ENCOUNTER — Other Ambulatory Visit: Payer: Self-pay

## 2020-09-07 ENCOUNTER — Other Ambulatory Visit
Admission: RE | Admit: 2020-09-07 | Discharge: 2020-09-07 | Disposition: A | Discharge status: Home / Self Care | Payer: Medicare Other | Source: Ambulatory Visit | Attending: Internal Medicine | Admitting: Internal Medicine

## 2020-09-07 DIAGNOSIS — Z01812 Encounter for preprocedural laboratory examination: Secondary | ICD-10-CM | POA: Diagnosis not present

## 2020-09-07 DIAGNOSIS — Z20822 Contact with and (suspected) exposure to covid-19: Secondary | ICD-10-CM | POA: Insufficient documentation

## 2020-09-07 LAB — SARS CORONAVIRUS 2 (TAT 6-24 HRS): SARS Coronavirus 2: NEGATIVE

## 2020-09-10 ENCOUNTER — Other Ambulatory Visit: Payer: Self-pay | Admitting: Internal Medicine

## 2020-09-10 ENCOUNTER — Telehealth: Payer: Self-pay | Admitting: Internal Medicine

## 2020-09-10 DIAGNOSIS — N6310 Unspecified lump in the right breast, unspecified quadrant: Secondary | ICD-10-CM

## 2020-09-10 NOTE — Telephone Encounter (Signed)
Reviewed the patient's chart. She was scheduled for a left heart cath on 09/04/20, but had to reschedule. She is currently scheduled for her cath tomorrow with Dr. Saunders Revel at 8:30 am.   Cath instructions as stated below have been sent to the patient via MyChart per her request.   You are scheduled for a Cardiac Catheterization on Tuesday, March 1 with Dr. Harrell Gave End.  1. Please arrive at the [facility] at 7:30 AM (This time is one hour before your procedure to ensure your preparation). Free valet parking service is available.    Once you enter, proceed to the 1st desk on the right, Registration, to check in.   Special note: Every effort is made to have your procedure done on time. Please understand that emergencies sometimes delay scheduled procedures.  2. Diet: Do not eat solid foods after midnight.  You may have clear liquids until 5am upon the day of the procedure.  3. Labs & COVID test: already completed  4. Medication instructions in preparation for your procedure:   Contrast Allergy: No   On the morning of your procedure, take your Aspirin and Plavix/Clopidogrel and any morning medicines NOT listed above.  You may use sips of water.  5. Plan for one night stay--bring personal belongings. 6. Bring a current list of your medications and current insurance cards. 7. You MUST have a responsible person to drive you home. 8. Someone MUST be with you the first 24 hours after you arrive home or your discharge will be delayed. 9. Please wear clothes that are easy to get on and off and wear slip-on shoes.  Thank you for allowing [place] to care for you!   -- [facility]

## 2020-09-10 NOTE — Telephone Encounter (Signed)
Patient calling for pre cath instructions.  Please send via mychart.

## 2020-09-11 ENCOUNTER — Ambulatory Visit
Admission: RE | Admit: 2020-09-11 | Discharge: 2020-09-11 | Disposition: A | Discharge status: Home / Self Care | Payer: Medicare Other | Attending: Internal Medicine | Admitting: Internal Medicine

## 2020-09-11 ENCOUNTER — Encounter
Admission: RE | Disposition: A | Discharge status: Home / Self Care | Payer: Self-pay | Source: Home / Self Care | Attending: Internal Medicine

## 2020-09-11 ENCOUNTER — Encounter: Payer: Self-pay | Admitting: Internal Medicine

## 2020-09-11 ENCOUNTER — Other Ambulatory Visit: Payer: Self-pay

## 2020-09-11 DIAGNOSIS — G4733 Obstructive sleep apnea (adult) (pediatric): Secondary | ICD-10-CM | POA: Insufficient documentation

## 2020-09-11 DIAGNOSIS — Z955 Presence of coronary angioplasty implant and graft: Secondary | ICD-10-CM | POA: Insufficient documentation

## 2020-09-11 DIAGNOSIS — R079 Chest pain, unspecified: Secondary | ICD-10-CM

## 2020-09-11 DIAGNOSIS — F17210 Nicotine dependence, cigarettes, uncomplicated: Secondary | ICD-10-CM | POA: Diagnosis not present

## 2020-09-11 DIAGNOSIS — Z91040 Latex allergy status: Secondary | ICD-10-CM | POA: Diagnosis not present

## 2020-09-11 DIAGNOSIS — Y832 Surgical operation with anastomosis, bypass or graft as the cause of abnormal reaction of the patient, or of later complication, without mention of misadventure at the time of the procedure: Secondary | ICD-10-CM | POA: Diagnosis not present

## 2020-09-11 DIAGNOSIS — Z7982 Long term (current) use of aspirin: Secondary | ICD-10-CM | POA: Diagnosis not present

## 2020-09-11 DIAGNOSIS — T82855A Stenosis of coronary artery stent, initial encounter: Secondary | ICD-10-CM | POA: Insufficient documentation

## 2020-09-11 DIAGNOSIS — I10 Essential (primary) hypertension: Secondary | ICD-10-CM | POA: Diagnosis not present

## 2020-09-11 DIAGNOSIS — I200 Unstable angina: Secondary | ICD-10-CM | POA: Diagnosis present

## 2020-09-11 DIAGNOSIS — Z7902 Long term (current) use of antithrombotics/antiplatelets: Secondary | ICD-10-CM | POA: Insufficient documentation

## 2020-09-11 DIAGNOSIS — D649 Anemia, unspecified: Secondary | ICD-10-CM | POA: Diagnosis not present

## 2020-09-11 DIAGNOSIS — Z888 Allergy status to other drugs, medicaments and biological substances status: Secondary | ICD-10-CM | POA: Insufficient documentation

## 2020-09-11 DIAGNOSIS — E119 Type 2 diabetes mellitus without complications: Secondary | ICD-10-CM | POA: Insufficient documentation

## 2020-09-11 DIAGNOSIS — E785 Hyperlipidemia, unspecified: Secondary | ICD-10-CM | POA: Insufficient documentation

## 2020-09-11 DIAGNOSIS — I25110 Atherosclerotic heart disease of native coronary artery with unstable angina pectoris: Secondary | ICD-10-CM

## 2020-09-11 DIAGNOSIS — Z79899 Other long term (current) drug therapy: Secondary | ICD-10-CM | POA: Diagnosis not present

## 2020-09-11 HISTORY — PX: LEFT HEART CATH AND CORONARY ANGIOGRAPHY: CATH118249

## 2020-09-11 LAB — GLUCOSE, CAPILLARY: Glucose-Capillary: 120 mg/dL — ABNORMAL HIGH (ref 70–99)

## 2020-09-11 SURGERY — LEFT HEART CATH AND CORONARY ANGIOGRAPHY
Anesthesia: Moderate Sedation | Laterality: Left

## 2020-09-11 MED ORDER — HEPARIN SODIUM (PORCINE) 1000 UNIT/ML IJ SOLN
INTRAMUSCULAR | Status: DC | PRN
  Administered 2020-09-11: 4500 [IU] via INTRAVENOUS

## 2020-09-11 MED ORDER — VERAPAMIL HCL 2.5 MG/ML IV SOLN
INTRAVENOUS | Status: DC | PRN
  Administered 2020-09-11: 2.5 mg via INTRAVENOUS

## 2020-09-11 MED ORDER — HEPARIN (PORCINE) IN NACL 1000-0.9 UT/500ML-% IV SOLN
INTRAVENOUS | Status: DC | PRN
  Administered 2020-09-11: 500 mL

## 2020-09-11 MED ORDER — LIDOCAINE HCL (PF) 1 % IJ SOLN
INTRAMUSCULAR | Status: AC
  Filled 2020-09-11: qty 30

## 2020-09-11 MED ORDER — SODIUM CHLORIDE 0.9 % IV SOLN: 250.0000 mL | INTRAVENOUS | Status: DC | PRN

## 2020-09-11 MED ORDER — SODIUM CHLORIDE 0.9 % WEIGHT BASED INFUSION: 1.0000 mL/kg/h | INTRAVENOUS | Status: DC

## 2020-09-11 MED ORDER — VERAPAMIL HCL 2.5 MG/ML IV SOLN
INTRAVENOUS | Status: AC
  Filled 2020-09-11: qty 2

## 2020-09-11 MED ORDER — SODIUM CHLORIDE 0.9% FLUSH: 3.0000 mL | INTRAVENOUS | Status: DC | PRN

## 2020-09-11 MED ORDER — SODIUM CHLORIDE 0.9% FLUSH: 3.0000 mL | Freq: Two times a day (BID) | INTRAVENOUS | Status: DC

## 2020-09-11 MED ORDER — HEPARIN (PORCINE) IN NACL 1000-0.9 UT/500ML-% IV SOLN
INTRAVENOUS | Status: AC
  Filled 2020-09-11: qty 1000

## 2020-09-11 MED ORDER — HYDRALAZINE HCL 20 MG/ML IJ SOLN: 10.0000 mg | INTRAMUSCULAR | Status: DC | PRN

## 2020-09-11 MED ORDER — FUROSEMIDE 20 MG PO TABS: 20.0000 mg | ORAL_TABLET | Freq: Every day | ORAL | 2 refills | Status: DC

## 2020-09-11 MED ORDER — IOHEXOL 300 MG/ML  SOLN
INTRAMUSCULAR | Status: DC | PRN
  Administered 2020-09-11: 55 mL

## 2020-09-11 MED ORDER — ASPIRIN EC 81 MG PO TBEC
DELAYED_RELEASE_TABLET | ORAL | Status: AC
  Filled 2020-09-11: qty 1

## 2020-09-11 MED ORDER — HEPARIN SODIUM (PORCINE) 1000 UNIT/ML IJ SOLN
INTRAMUSCULAR | Status: AC
  Filled 2020-09-11: qty 1

## 2020-09-11 MED ORDER — FENTANYL CITRATE (PF) 100 MCG/2ML IJ SOLN
INTRAMUSCULAR | Status: AC
  Filled 2020-09-11: qty 2

## 2020-09-11 MED ORDER — LABETALOL HCL 5 MG/ML IV SOLN: 10.0000 mg | INTRAVENOUS | Status: DC | PRN

## 2020-09-11 MED ORDER — NITROGLYCERIN 0.4 MG SL SUBL
SUBLINGUAL_TABLET | SUBLINGUAL | Status: AC
  Filled 2020-09-11: qty 1

## 2020-09-11 MED ORDER — MIDAZOLAM HCL 2 MG/2ML IJ SOLN
INTRAMUSCULAR | Status: AC
  Filled 2020-09-11: qty 2

## 2020-09-11 MED ORDER — ASPIRIN 81 MG PO CHEW
81.0000 mg | CHEWABLE_TABLET | ORAL | Status: AC
  Administered 2020-09-11: 81 mg via ORAL

## 2020-09-11 MED ORDER — SODIUM CHLORIDE 0.9 % WEIGHT BASED INFUSION
3.0000 mL/kg/h | INTRAVENOUS | Status: AC
  Administered 2020-09-11: 3 mL/kg/h via INTRAVENOUS

## 2020-09-11 MED ORDER — NITROGLYCERIN 0.4 MG SL SUBL
0.4000 mg | SUBLINGUAL_TABLET | SUBLINGUAL | Status: DC | PRN
  Administered 2020-09-11: 0.4 mg via SUBLINGUAL

## 2020-09-11 MED ORDER — HEPARIN (PORCINE) IN NACL 1000-0.9 UT/500ML-% IV SOLN: INTRAVENOUS | Status: DC | PRN

## 2020-09-11 MED ORDER — FENTANYL CITRATE (PF) 100 MCG/2ML IJ SOLN
INTRAMUSCULAR | Status: DC | PRN
  Administered 2020-09-11 (×3): 25 ug via INTRAVENOUS

## 2020-09-11 MED ORDER — MIDAZOLAM HCL 2 MG/2ML IJ SOLN
INTRAMUSCULAR | Status: DC | PRN
  Administered 2020-09-11 (×3): 1 mg via INTRAVENOUS

## 2020-09-11 MED ORDER — ONDANSETRON HCL 4 MG/2ML IJ SOLN: 4.0000 mg | Freq: Four times a day (QID) | INTRAMUSCULAR | Status: DC | PRN

## 2020-09-11 SURGICAL SUPPLY — 10 items
CATH INFINITI 5 FR JL3.5 (CATHETERS) ×2 IMPLANT
CATH INFINITI JR4 5F (CATHETERS) ×2 IMPLANT
DEVICE RAD TR BAND REGULAR (VASCULAR PRODUCTS) ×2 IMPLANT
GLIDESHEATH SLEND SS 6F .021 (SHEATH) ×2 IMPLANT
GUIDEWIRE INQWIRE 1.5J.035X260 (WIRE) ×1 IMPLANT
INQWIRE 1.5J .035X260CM (WIRE) ×2
KIT MANI 3VAL PERCEP (MISCELLANEOUS) ×2 IMPLANT
PACK CARDIAC CATH (CUSTOM PROCEDURE TRAY) ×2 IMPLANT
PROTECTION STATION PRESSURIZED (MISCELLANEOUS)
STATION PROTECTION PRESSURIZED (MISCELLANEOUS) IMPLANT

## 2020-09-11 NOTE — Brief Op Note (Signed)
BRIEF CARDIAC CATHETERIZATION NOTE  09/11/2020  8:33 AM  PATIENT:  Jim Like  55 y.o. female  PRE-OPERATIVE DIAGNOSIS: Accelerating angina  POST-OPERATIVE DIAGNOSIS: Same  PROCEDURE:  Procedure(s): LEFT HEART CATH AND CORONARY ANGIOGRAPHY (Left)  SURGEON:  Surgeon(s) and Role:    * End, Harrell Gave, MD - Primary  FINDINGS: 1. Non-obstructive CAD. 2. Patent ostial LCx stent with mild-moderate ISR. 3. Moderately elevated LVEDP with normal LVEF.  RECOMMENDATIONS: 1. Continue medical therapy. 2. Add furosemide 20 mg daily for element of HFpEF.  Nelva Bush, MD [facility]

## 2020-09-11 NOTE — Progress Notes (Signed)
Pt. C/o severe pain in right wrist: attempted to remove 2 ml air & wrist immediately bled. Air replaced to right wrist. Pt. Then c/o severe right shoulder , right chest and back pain. Dr. Saunders Revel at bedside now. 1 NTG sl given per MD order. Pt. States she felt "better" 3 min. After the SL NTG given.

## 2020-09-11 NOTE — Interval H&P Note (Signed)
History and Physical Interval Note:  09/11/2020 7:59 AM  Shawna Anderson  has presented today for surgery, with the diagnosis of accelerating angina.  The various methods of treatment have been discussed with the patient and family. After consideration of risks, benefits and other options for treatment, the patient has consented to  Procedure(s): LEFT HEART CATH AND CORONARY ANGIOGRAPHY (Left) as a surgical intervention.  The patient's history has been reviewed, patient examined, no change in status, stable for surgery.  I have reviewed the patient's chart and labs.  Questions were answered to the patient's satisfaction.    Cath Lab Visit (complete for each Cath Lab visit)  Clinical Evaluation Leading to the Procedure:   ACS: No.  Non-ACS:    Anginal Classification: CCS IV  Anti-ischemic medical therapy: Maximal Therapy (2 or more classes of medications)  Non-Invasive Test Results: No non-invasive testing performed  Prior CABG: No previous CABG  Christopher End

## 2020-09-12 ENCOUNTER — Encounter: Payer: Self-pay | Admitting: Internal Medicine

## 2020-09-18 ENCOUNTER — Telehealth: Payer: Self-pay | Admitting: Internal Medicine

## 2020-09-18 NOTE — Telephone Encounter (Signed)
  Patient Consent for Virtual Visit         Shawna Anderson has provided verbal consent on 09/18/2020 for a virtual visit (video or telephone).   CONSENT FOR VIRTUAL VISIT FOR:  Shawna Anderson  By participating in this virtual visit I agree to the following:  I hereby voluntarily request, consent and authorize [facility] and its employed or contracted physicians, Engineer, materials, nurse practitioners or other licensed health care professionals (the Practitioner), to provide me with telemedicine health care services (the "Services") as deemed necessary by the treating Practitioner. I acknowledge and consent to receive the Services by the Practitioner via telemedicine. I understand that the telemedicine visit will involve communicating with the Practitioner through live audiovisual communication technology and the disclosure of certain medical information by electronic transmission. I acknowledge that I have been given the opportunity to request an in-person assessment or other available alternative prior to the telemedicine visit and am voluntarily participating in the telemedicine visit.  I understand that I have the right to withhold or withdraw my consent to the use of telemedicine in the course of my care at any time, without affecting my right to future care or treatment, and that the Practitioner or I may terminate the telemedicine visit at any time. I understand that I have the right to inspect all information obtained and/or recorded in the course of the telemedicine visit and may receive copies of available information for a reasonable fee.  I understand that some of the potential risks of receiving the Services via telemedicine include:  Marland Kitchen Delay or interruption in medical evaluation due to technological equipment failure or disruption; . Information transmitted may not be sufficient (e.g. poor resolution of images) to allow for appropriate medical decision making by the  Practitioner; and/or  . In rare instances, security protocols could fail, causing a breach of personal health information.  Furthermore, I acknowledge that it is my responsibility to provide information about my medical history, conditions and care that is complete and accurate to the best of my ability. I acknowledge that Practitioner's advice, recommendations, and/or decision may be based on factors not within their control, such as incomplete or inaccurate data provided by me or distortions of diagnostic images or specimens that may result from electronic transmissions. I understand that the practice of medicine is not an exact science and that Practitioner makes no warranties or guarantees regarding treatment outcomes. I acknowledge that a copy of this consent can be made available to me via my patient portal (Shawna Anderson), or I can request a printed copy by calling the office of [facility].    I understand that my insurance will be billed for this visit.   I have read or had this consent read to me. . I understand the contents of this consent, which adequately explains the benefits and risks of the Services being provided via telemedicine.  . I have been provided ample opportunity to ask questions regarding this consent and the Services and have had my questions answered to my satisfaction. . I give my informed consent for the services to be provided through the use of telemedicine in my medical care

## 2020-09-19 ENCOUNTER — Other Ambulatory Visit: Payer: Self-pay

## 2020-09-19 ENCOUNTER — Encounter: Payer: Self-pay | Admitting: Family

## 2020-09-19 ENCOUNTER — Telehealth (INDEPENDENT_AMBULATORY_CARE_PROVIDER_SITE_OTHER): Payer: Medicare Other | Admitting: Family

## 2020-09-19 VITALS — BP 127/77 | HR 73 | Ht 63.0 in | Wt 192.0 lb

## 2020-09-19 DIAGNOSIS — I10 Essential (primary) hypertension: Secondary | ICD-10-CM

## 2020-09-19 DIAGNOSIS — I5189 Other ill-defined heart diseases: Secondary | ICD-10-CM

## 2020-09-19 DIAGNOSIS — G4733 Obstructive sleep apnea (adult) (pediatric): Secondary | ICD-10-CM

## 2020-09-19 DIAGNOSIS — E7849 Other hyperlipidemia: Secondary | ICD-10-CM | POA: Diagnosis not present

## 2020-09-19 DIAGNOSIS — E785 Hyperlipidemia, unspecified: Secondary | ICD-10-CM

## 2020-09-19 DIAGNOSIS — Z720 Tobacco use: Secondary | ICD-10-CM | POA: Diagnosis not present

## 2020-09-19 DIAGNOSIS — I25118 Atherosclerotic heart disease of native coronary artery with other forms of angina pectoris: Secondary | ICD-10-CM

## 2020-09-19 MED ORDER — ISOSORBIDE MONONITRATE ER 60 MG PO TB24: 60.0000 mg | ORAL_TABLET | Freq: Every day | ORAL | 1 refills | Status: DC

## 2020-09-19 NOTE — Patient Instructions (Addendum)
Medication Instructions:  No medication changes today.   *If you need a refill on your cardiac medications before your next appointment, please call your pharmacy*  Lab Work: Your physician recommends that you return for lab work on 10/02/20 at the [facility] for BMP, lipoprotein a  Please stop by the [facility] the day of your appointment with Dr. Patsey Berthold for lab work. You do not need an appointment and do not need to be fasting.   The BMP will check your kidneys. The Lipoprotein a will check for elevated cholesterol due to genetics.  If you have labs (blood work) drawn today and your tests are completely normal, you will receive your results only by:  [place] (if you have MyChart) OR  A paper copy in the mail If you have any lab test that is abnormal or we need to change your treatment, we will call you to review the results.  Testing/Procedures: None ordered today.  Your cardiac catheterization showed some extra fluid which is why Dr. Saunders Revel started Furosemide (Lasix) 20mg  daily. Recommend you continue this medication as it prevents excess fluid.  Follow-Up: At [facility], you and your health needs are our priority.  As part of our continuing mission to provide you with exceptional heart care, we have created designated Provider Care Teams.  These Care Teams include your primary Cardiologist (physician) and Advanced Practice Providers (APPs -  Physician Assistants and Nurse Practitioners) who all work together to provide you with the care you need, when you need it.  We recommend signing up for the patient portal called "MyChart".  Sign up information is provided on this After Visit Summary.  MyChart is used to connect with patients for Virtual Visits (Telemedicine).  Patients are able to view lab/test results, encounter notes, upcoming appointments, etc.  Non-urgent messages can be sent to your provider as well.   To learn more about what you can do with MyChart, go to  [email or web address].    Your next appointment:   3 month(s)  The format for your next appointment:   In Person  Provider:   You may see Nelva Bush, MD or one of the following Advanced Practice Providers on your designated Care Team:    Murray Hodgkins, NP  Christell Faith, PA-C  Marrianne Mood, PA-C  Cadence Kathlen Mody, [place]  Laurann Montana, NP  Other Instructions   Recommend following up with your primary care provider regarding back pain. You could trial heat, gentle stretching, or over the counter Voltaren gel for relief.    If your chest pain becomes more frequent, please contact our office. We could consider further increasing your dose of Isosorbide Mononitrate (Imdur) at that time if your blood pressure allows.   Heart Healthy Diet Recommendations: A low-salt diet is recommended. Meats should be grilled, baked, or boiled. Avoid fried foods. Focus on lean protein sources like fish or chicken with vegetables and fruits. The American Heart Association is a Microbiologist!  American Heart Association Diet and Lifeystyle Recommendations   Exercise recommendations: The American Heart Association recommends 150 minutes of moderate intensity exercise weekly. Try 30 minutes of moderate intensity exercise 4-5 times per week. This could include walking, jogging, or swimming.

## 2020-09-19 NOTE — Progress Notes (Addendum)
Virtual Visit via Video Note   This visit type was conducted due to national recommendations for restrictions regarding the COVID-19 Pandemic (e.g. social distancing) in an effort to limit this patient's exposure and mitigate transmission in our community.  Due to her co-morbid illnesses, this patient is at least at moderate risk for complications without adequate follow up.  This format is felt to be most appropriate for this patient at this time.  All issues noted in this document were discussed and addressed.  A limited physical exam was performed with this format.  Please refer to the patient's chart for her consent to telehealth for [facility].    Date:  09/19/2020   ID:  DURINDA BUZZELLI, DOB May 10, 1966, MRN [id number] The patient was identified using 2 identifiers.  Patient Location: Home Provider Location: Office/Clinic   PCP:  McLean-Scocuzza, Nino Glow, MD   [place]  Cardiologist:  Nelva Bush, MD   Evaluation Performed:  Follow-Up Visit  Chief Complaint:  Follow up after cardiac cath  History of Present Illness:    JACKLYNN DEHAAS is a 55 y.o. female with CAD (2010 BMS to LCx: 2017 restenosis of LCx), HTN, HLD, GERD, sleep apnea, asthma, current smoker with both tobacco and marijuana, bipolar/anxiety. She was last seen for cardiac catheterization 09/11/2020 and presents today for follow-up.  CAD was initially discovered during preop work-up for prior ovarian cyst removal in 2010. BMS placed ostial/proximal LCx. She was admitted to [facility] 03/1016 for chest pain after being off multiple medications due to financial constraints. Catheterization showed moderate in-stent restenosis of the ostial/proximal left circumflex stent. Recommended for medical management. She has had multiple office visits since that time reporting stable anginal symptoms with continued medication adjustments. She does use nitroglycerin as needed. Imdur 30 mg daily was  added to her office visit in December due to anginal symptoms and cardiac catheterization was offered though she declined.   Seen in follow up 01/31/20. Noted to have been seen by Dr. Vicente Males of GI the day prior and recommended for barium swallow due to dysphagia. She endorsed reduced frequency of chest pain since addition of Imdur.  Continued to smoke, but had purchased nicotine patches and is planning to quit. BP at home has been checked intermittently and labile though noted taking medication at very different times each day.   She had a gastric emptying study which was slightly delayed. Continues to follow with GI.  She was seen 08/30/2020 noting increasing episodes of chest pain with minimal exertion associated with dyspnea.  She had EKG showing new T wave inversion to lateral leads with high-sensitivity troponin unremarkable.  She was recommended for left heart cath and Imdur was increased to 60 mg daily. LHC 09/11/20 with moderately severe multivessel coronary disease with mild plaque in prox and distal LAD, 40% ISR of ostial LCx, 30% distal LCx lesion, focal 60% mid RCA stenosis. MOderately elevated LVEDP started on Lasix 20mg  daily. Noted difficulty with sedation during procedure.   Presents today for follow up.  Tells me her catheterization site healed well and she has no pain at the site.  We reviewed the addition of Lasix 1 mg daily and she tells me she has been compliant and that her breathing is about the same.  Reports chest pain is occurring less frequently.  She is not taking nitroglycerin.  Reports compliance with Imdur, atorvastatin, Zetia.  In regards to her breathing she tells me "I have my days" but overall  stable at baseline  Primary complaint is back pain and worried about her kidneys.  She has tried heat pack with some relief.  She has upcoming consult with Dr. Patsey Berthold of pulmonology 10/02/2020 and she is agreeable to have repeat lab work done that day.  She tells me she is taking her  atorvastatin 80 mg daily and Zetia 10 mg daily though lab work shows persistent LDL 148 and total cholesterol 230.  She tells me this is genetic and all of her family has high cholesterol no matter what medications these take.  We discussed the utility of a lipoprotein a.   Past Medical History:  Diagnosis Date  . Anxiety   . Asthma   . Bipolar 1 disorder ([place])   . Bipolar disorder ([place])   . CAD (coronary artery disease)    s/p stent BMS OM Cx  . Cervical herniated disc 04/12/2016  . COPD (chronic obstructive pulmonary disease) ([place])   . Depression   . Diabetes mellitus without complication ([place])   . Diverticulitis   . GERD (gastroesophageal reflux disease)   . Glaucoma   . History of blood transfusion   . Hyperlipidemia   . Hypertension   . OSA (obstructive sleep apnea)    not using cpap   . Plantar fasciitis    b/l feet s/p steroid shots w/o help and left surgery w/o help   . UTI (urinary tract infection)    Past Surgical History:  Procedure Laterality Date  . ABDOMINAL HYSTERECTOMY    . ABDOMINAL SURGERY  1995   Bowel resection.  Marland Kitchen CARDIAC CATHETERIZATION N/A 04/14/2016   Procedure: Left Heart Cath and Coronary Angiography;  Surgeon: Burnell Blanks, MD;  Location: [place] [facility];  Service: Cardiovascular;  Laterality: N/A;  . CARDIAC SURGERY    . COLONOSCOPY WITH PROPOFOL N/A 07/11/2019   Procedure: COLONOSCOPY WITH PROPOFOL;  Surgeon: Jonathon Bellows, MD;  Location[facility] ENDOSCOPY;  Service: Gastroenterology;  Laterality: N/A;  . COLONOSCOPY WITH PROPOFOL N/A 07/29/2019   Procedure: COLONOSCOPY WITH PROPOFOL;  Surgeon: Jonathon Bellows, MD;  Location: [facility] ENDOSCOPY;  Service: Gastroenterology;  Laterality: N/A;  . CORONARY ANGIOPLASTY WITH STENT PLACEMENT  2010   Drug eluting stent  . ESOPHAGOGASTRODUODENOSCOPY (EGD) WITH PROPOFOL N/A 07/11/2019   Procedure: ESOPHAGOGASTRODUODENOSCOPY (EGD) WITH PROPOFOL;  Surgeon: Jonathon Bellows, MD;  Location: [facility] ENDOSCOPY;  Service:  Gastroenterology;  Laterality: N/A;  . LEFT HEART CATH AND CORONARY ANGIOGRAPHY Left 09/11/2020   Procedure: LEFT HEART CATH AND CORONARY ANGIOGRAPHY;  Surgeon: Nelva Bush, MD;  Location: [place] [facility];  Service: Cardiovascular;  Laterality: Left;  . OVARIAN CYST REMOVAL       Current Meds  Medication Sig  . amLODipine (NORVASC) 10 MG tablet Take 1 tablet (10 mg total) by mouth at bedtime.  Marland Kitchen aspirin 81 MG chewable tablet Chew 81 mg by mouth daily.  Marland Kitchen atorvastatin (LIPITOR) 80 MG tablet Take 1 tablet (80 mg total) by mouth daily at 6 PM.  . clopidogrel (PLAVIX) 75 MG tablet Take 1 tablet (75 mg total) by mouth daily.  . divalproex (DEPAKOTE) 500 MG DR tablet TAKE 1 TABLET BY MOUTH  TWICE DAILY  . ezetimibe (ZETIA) 10 MG tablet Take 1 tablet (10 mg total) by mouth daily. With lipitor 80 mg at night  . furosemide (LASIX) 20 MG tablet Take 1 tablet (20 mg total) by mouth daily.  . mometasone-formoterol (DULERA) 100-5 MCG/ACT AERO Inhale 2 puffs into the lungs in the morning and at bedtime.  Rinse mouth  . olmesartan-hydrochlorothiazide (BENICAR HCT) 40-25 MG tablet Take 1 tablet by mouth daily.  . pantoprazole (PROTONIX) 40 MG tablet TAKE 1 TABLET BY MOUTH  TWICE DAILY BEFORE MEALS  . traZODone (DESYREL) 150 MG tablet Take 1 tablet (150 mg total) by mouth at bedtime as needed for sleep.  . [DISCONTINUED] isosorbide mononitrate (IMDUR) 60 MG 24 hr tablet Take 1 tablet (60 mg total) by mouth daily.     Allergies:   Ativan [lorazepam], Latex, Tape, Xifaxan [rifaximin], and Drixoral allergy sinus [dexbromphen-pse-apap er]   Social History   Tobacco Use  . Smoking status: Former Smoker    Packs/day: 0.50    Years: 30.00    Pack years: 15.00    Types: Cigarettes    Quit date: 03/22/2020    Years since quitting: 0.4  . Smokeless tobacco: Never Used  . Tobacco comment: refused  Vaping Use  . Vaping Use: Never used  Substance Use Topics  . Alcohol use: Not Currently    Comment:  once a year  . Drug use: Yes    Frequency: 7.0 times per week    Types: Marijuana     Family Hx: The patient's family history includes Alcohol abuse in her father; CAD in her brother and mother; Depression in her brother and mother; Diabetes in her brother; Heart disease in her brother, father, and mother; Hyperlipidemia in her brother and mother; Hypertension in her mother; Lupus in an other family member; Sickle cell anemia in an other family member.  ROS:   Please see the history of present illness.     All other systems reviewed and are negative.  Prior CV studies:   The following studies were reviewed today:  LHC 09/11/20 Conclusions: 1. Moderately severe multivessel coronary artery disease including mild plaquing of the proximal and distal LAD, 40% in-stent restenosis of ostial LCx, 30% distal LCx lesion, and focal 60% mid RCA stenosis. 2. Hyperdynamic left ventricular contraction with moderately elevated filling pressure. 3. Significant difficulty attaining adequate sedation during procedure.  The patient would fluctuate between marked agitation and sleep every few minutes after having received 3 mg of midazolam and 75 mcg of fentanyl.   Recommendations: 1. Continue current antianginal therapy with escalation as tolerated if recurrent angina occurs. 2. Add furosemide 20 mg daily for gentle diuresis in the setting of elevated LVEDP consistent with diastolic dysfunction.  This may be contributing to the patient's symptoms. 3. If the patient has refractory angina, functional study to assess hemodynamic significance of LCx/RCA disease is recommended.  If catheterization/PCI is needed in the future, involvement of anesthesia will need to be considered.   Labs/Other Tests and Data Reviewed:    EKG:  No ECG reviewed.  Recent Labs: 11/15/2019: Magnesium 2.3 08/16/2020: Hemoglobin 13.1; Platelets 424 08/30/2020: ALT 54; BUN 22; Creatinine, Ser 0.68; Potassium 4.1; Sodium 140   Recent  Lipid Panel Lab Results  Component Value Date/Time   CHOL 230 (H) 08/30/2020 11:12 AM   TRIG 166 (H) 08/30/2020 11:12 AM   HDL 52 08/30/2020 11:12 AM   CHOLHDL 4.4 08/30/2020 11:12 AM   LDLCALC 145 (H) 08/30/2020 11:12 AM   LDLCALC 193 (H) 12/31/2017 09:05 AM   LDLDIRECT 148.0 (H) 08/30/2020 11:12 AM    Wt Readings from Last 3 Encounters:  09/19/20 192 lb (87.1 kg)  09/11/20 192 lb 0.3 oz (87.1 kg)  08/30/20 192 lb (87.1 kg)     Objective:    Vital Signs:  BP 127/77  Pulse 73   Ht 5\' 3"  (1.6 m)   Wt 192 lb (87.1 kg)   BMI 34.01 kg/m    VITAL SIGNS:  reviewed RESPIRATORY:  normal respiratory effort, symmetric expansion CARDIOVASCULAR:  no peripheral edema  ASSESSMENT & PLAN:    1. CAD -LHC 09/2018 with moderately severe multivessel coronary disease including mild plaque of proximal and distal LAD, 40% ISR of ostial LCx, 30% distal LCx, focal 60% mid RCA.  Hyperdynamic LV contraction with moderately elevated LVEDP.  She was recommended to continue antianginal therapy and add furosemide 20 mg daily due to elevated LVEDP.  Tolerating Furosemide 20 mg daily without difficulty, will continue.  Plan for BMP in 2 weeks when she is at [facility] for pulmonology appointment.  Tolerating Imdur 60 mg daily.  Will defer escalation and has her blood pressure is at goal and she reports episodes of chest pain are decreasing in frequency.  GDMT includes aspirin, Plavix, Imdur, statin, Zetia, as needed nitroglycerin.  2. HTN - BP well controlled. Continue current antihypertensive regimen.   3. HLD, LDL <70 -lipid panel 08/30/2020 total cholesterol 230, HDL 52, direct LDL 148, triglycerides 166.  Endorses compliance with atorvastatin 80 mg daily and Zetia 10 mg daily though this is surprising given LDL remains at 148.Marland Kitchen  Plan for lipoprotein a.  May benefit from future referral to lipid clinic.  4. Tobacco use - Smoking cessation encouraged. Recommend utilization of [id number].  5. DM2 - Continue  to follow with PCP.   6. OSA not on CPAP - Continue to decline CPAP.    Time:   Today, I have spent 11 minutes with the patient with telehealth technology discussing the above problems.     Medication Adjustments/Labs and Tests Ordered: Current medicines are reviewed at length with the patient today.  Concerns regarding medicines are outlined above.   Tests Ordered: Orders Placed This Encounter  Procedures  . Basic metabolic panel  . Lipoprotein A (LPA)    Medication Changes: Meds ordered this encounter  Medications  . isosorbide mononitrate (IMDUR) 60 MG 24 hr tablet    Sig: Take 1 tablet (60 mg total) by mouth daily.    Dispense:  90 tablet    Refill:  1    Order Specific Question:   Supervising Provider    Answer:   Richardo Priest [[id number]]    Follow Up:  In Person in 3 month(s)  Signed, Loel Dubonnet, NP  09/19/2020 12:09 PM    [facility]

## 2020-10-02 ENCOUNTER — Encounter: Payer: Self-pay | Admitting: Pulmonary Disease

## 2020-10-02 ENCOUNTER — Telehealth: Payer: Self-pay | Admitting: *Deleted

## 2020-10-02 ENCOUNTER — Ambulatory Visit (INDEPENDENT_AMBULATORY_CARE_PROVIDER_SITE_OTHER): Payer: Medicare Other | Admitting: Pulmonary Disease

## 2020-10-02 ENCOUNTER — Other Ambulatory Visit
Admission: RE | Admit: 2020-10-02 | Discharge: 2020-10-02 | Disposition: A | Discharge status: Home / Self Care | Payer: Medicare Other | Source: Ambulatory Visit | Attending: Family | Admitting: Family

## 2020-10-02 ENCOUNTER — Other Ambulatory Visit: Payer: Self-pay

## 2020-10-02 VITALS — BP 136/78 | HR 96 | Ht 63.0 in | Wt 192.4 lb

## 2020-10-02 DIAGNOSIS — I5189 Other ill-defined heart diseases: Secondary | ICD-10-CM | POA: Insufficient documentation

## 2020-10-02 DIAGNOSIS — I200 Unstable angina: Secondary | ICD-10-CM

## 2020-10-02 DIAGNOSIS — E876 Hypokalemia: Secondary | ICD-10-CM

## 2020-10-02 DIAGNOSIS — E7849 Other hyperlipidemia: Secondary | ICD-10-CM | POA: Insufficient documentation

## 2020-10-02 DIAGNOSIS — I10 Essential (primary) hypertension: Secondary | ICD-10-CM | POA: Diagnosis not present

## 2020-10-02 DIAGNOSIS — J449 Chronic obstructive pulmonary disease, unspecified: Secondary | ICD-10-CM | POA: Diagnosis not present

## 2020-10-02 DIAGNOSIS — I25118 Atherosclerotic heart disease of native coronary artery with other forms of angina pectoris: Secondary | ICD-10-CM | POA: Insufficient documentation

## 2020-10-02 DIAGNOSIS — F17210 Nicotine dependence, cigarettes, uncomplicated: Secondary | ICD-10-CM

## 2020-10-02 DIAGNOSIS — R0602 Shortness of breath: Secondary | ICD-10-CM | POA: Diagnosis not present

## 2020-10-02 LAB — BASIC METABOLIC PANEL
Anion gap: 9 (ref 5–15)
BUN: 23 mg/dL — ABNORMAL HIGH (ref 6–20)
CO2: 29 mmol/L (ref 22–32)
Calcium: 9.4 mg/dL (ref 8.9–10.3)
Chloride: 99 mmol/L (ref 98–111)
Creatinine, Ser: 0.87 mg/dL (ref 0.44–1.00)
GFR, Estimated: >60 mL/min (ref >60)
Glucose, Bld: 142 mg/dL — ABNORMAL HIGH (ref 70–99)
Potassium: 3.3 mmol/L — ABNORMAL LOW (ref 3.5–5.1)
Sodium: 137 mmol/L (ref 135–145)

## 2020-10-02 MED ORDER — BREZTRI AEROSPHERE 160-9-4.8 MCG/ACT IN AERO: 2.0000 | INHALATION_SPRAY | Freq: Two times a day (BID) | RESPIRATORY_TRACT | 0 refills | Status: DC

## 2020-10-02 MED ORDER — POTASSIUM CHLORIDE ER 20 MEQ PO TBCR: EXTENDED_RELEASE_TABLET | ORAL | 0 refills | Status: DC

## 2020-10-02 NOTE — Telephone Encounter (Signed)
Spoke to pt, notified of lab results and provider's recc.  Pt verbalized understanding. She will:  Start Potassium 32mEq tomorrow then will take 52mEq daily. Repeat Bmet in 1 week [facility] the medical mall at [facility].  Rx sent to [facility] [address] per pt request.  Pt does confirm that she has transportation now to pick up Rx.  Pt also confirms understanding of how to have lab work done at [facility].  Pt has no questions at this time.

## 2020-10-02 NOTE — Progress Notes (Signed)
Subjective:    Patient ID: Shawna Anderson, female    DOB: 1965-12-26, 55 y.o.   MRN: [id number] Chief Complaint  Patient presents with  . pulmonary consult    Per Olivia Mackie McLean--c/o sob with exertion and occ wheezing.    HPI Patient is a 55 year old current smoker (half PPD) who presents for evaluation of dyspnea on exertion with occasional wheezing.  She is kindly referred by Dr. Terese Door.  The patient has significant gain weight over the last several years.  Has noted increasing shortness of breath as noted above.  Was recently admitted because of unstable angina went cardiac catheterization on 11 September 2020 showing moderately severe multivessel coronary artery disease, hyperdynamic left ventricular contraction with moderately elevated filling pressures.  The catheterization was difficult due to poor cooperation of the patient during the procedure due to inability to properly sedate.  At that time furosemide was added to her antianginal medications with elevated LVEDP consistent with diastolic dysfunction.  Ejection fraction was not calculated due to difficulties obtaining sedation for the patient.  Patient has multiple complaints to include chest pain and chest "muscle spasms" ,heartburn with reflux and weight gain as noted above.  She does have occasional orthopnea but no paroxysmal nocturnal dyspnea.  Does have lower extremity edema which the Lasix has been helping with.  She has been told in the past that she has "COPD".  She is on [facility] and as needed Combivent..   Review of Systems A 10 point review of systems was performed and it is as noted above otherwise negative.  Past Medical History:  Diagnosis Date  . Anxiety   . Asthma   . Bipolar 1 disorder ([place])   . Bipolar disorder ([place])   . CAD (coronary artery disease)    s/p stent BMS OM Cx  . Cervical herniated disc 04/12/2016  . COPD (chronic obstructive pulmonary disease) ([place])   . Depression   . Diabetes mellitus  without complication ([place])   . Diverticulitis   . GERD (gastroesophageal reflux disease)   . Glaucoma   . History of blood transfusion   . Hyperlipidemia   . Hypertension   . OSA (obstructive sleep apnea)    not using cpap   . Plantar fasciitis    b/l feet s/p steroid shots w/o help and left surgery w/o help   . UTI (urinary tract infection)    Past Surgical History:  Procedure Laterality Date  . ABDOMINAL HYSTERECTOMY    . ABDOMINAL SURGERY  1995   Bowel resection.  Marland Kitchen CARDIAC CATHETERIZATION N/A 04/14/2016   Procedure: Left Heart Cath and Coronary Angiography;  Surgeon: Burnell Blanks, MD;  Location: [place] CV LAB;  Service: Cardiovascular;  Laterality: N/A;  . CARDIAC SURGERY    . COLONOSCOPY WITH PROPOFOL N/A 07/11/2019   Procedure: COLONOSCOPY WITH PROPOFOL;  Surgeon: Jonathon Bellows, MD;  Location: [facility] ENDOSCOPY;  Service: Gastroenterology;  Laterality: N/A;  . COLONOSCOPY WITH PROPOFOL N/A 07/29/2019   Procedure: COLONOSCOPY WITH PROPOFOL;  Surgeon: Jonathon Bellows, MD;  Location: [facility] ENDOSCOPY;  Service: Gastroenterology;  Laterality: N/A;  . CORONARY ANGIOPLASTY WITH STENT PLACEMENT  2010   Drug eluting stent  . ESOPHAGOGASTRODUODENOSCOPY (EGD) WITH PROPOFOL N/A 07/11/2019   Procedure: ESOPHAGOGASTRODUODENOSCOPY (EGD) WITH PROPOFOL;  Surgeon: Jonathon Bellows, MD;  Location: [facility] ENDOSCOPY;  Service: Gastroenterology;  Laterality: N/A;  . LEFT HEART CATH AND CORONARY ANGIOGRAPHY Left 09/11/2020   Procedure: LEFT HEART CATH AND CORONARY ANGIOGRAPHY;  Surgeon: Nelva Bush, MD;  Location[facility]  INVASIVE CV LAB;  Service: Cardiovascular;  Laterality: Left;  . OVARIAN CYST REMOVAL     Family History  Problem Relation Age of Onset  . CAD Mother   . Depression Mother   . Heart disease Mother   . Hyperlipidemia Mother   . Hypertension Mother   . CAD Brother   . Depression Brother   . Diabetes Brother   . Heart disease Brother   . Hyperlipidemia Brother   . Heart disease  Father   . Alcohol abuse Father   . Lupus Other   . Sickle cell anemia Other    Social History   Tobacco Use  . Smoking status: Current Every Day Smoker    Packs/day: 1.00    Years: 30.00    Pack years: 30.00    Types: Cigarettes    Last attempt to quit: 03/22/2020    Years since quitting: 0.6  . Smokeless tobacco: Never Used  . Tobacco comment: 0.5 PPD 10/02/2020  Substance Use Topics  . Alcohol use: Not Currently    Comment: once a year   Allergies  Allergen Reactions  . Ativan [Lorazepam]     Pt states it makes her tongue do weird things   . Latex Other (See Comments)    Patient stated that she was told by her doctor that she is "allergic to" this  . Tape Other (See Comments)    Patient stated that she was told by her doctor that she is "allergic to" this  . Xifaxan [Rifaximin]     Pt believes this is contributing to her abdominal pain/discomfort  . Drixoral Allergy Sinus [Dexbromphen-Pse-Apap Er] Rash    Current Meds  Medication Sig  . amLODipine (NORVASC) 10 MG tablet Take 1 tablet (10 mg total) by mouth at bedtime.  Marland Kitchen aspirin 81 MG chewable tablet Chew 81 mg by mouth daily.  . clopidogrel (PLAVIX) 75 MG tablet Take 1 tablet (75 mg total) by mouth daily.  . divalproex (DEPAKOTE) 500 MG DR tablet TAKE 1 TABLET BY MOUTH  TWICE DAILY  . ezetimibe (ZETIA) 10 MG tablet Take 1 tablet (10 mg total) by mouth daily. With lipitor 80 mg at night  . furosemide (LASIX) 20 MG tablet Take 1 tablet (20 mg total) by mouth daily.  . isosorbide mononitrate (IMDUR) 60 MG 24 hr tablet Take 1 tablet (60 mg total) by mouth daily.  . mometasone-formoterol (DULERA) 100-5 MCG/ACT AERO Inhale 2 puffs into the lungs in the morning and at bedtime. Rinse mouth  . olmesartan-hydrochlorothiazide (BENICAR HCT) 40-25 MG tablet Take 1 tablet by mouth daily.  . pantoprazole (PROTONIX) 40 MG tablet TAKE 1 TABLET BY MOUTH  TWICE DAILY BEFORE MEALS  . traZODone (DESYREL) 150 MG tablet Take 1 tablet (150 mg  total) by mouth at bedtime as needed for sleep.   Immunization History  Administered Date(s) Administered  . PFIZER(Purple Top)SARS-COV-2 Vaccination 11/04/2019, 11/18/2019, 04/23/2020      Objective:   Physical Exam BP 136/78 (BP Location: Left Arm, Cuff Size: Normal)   Pulse 96   Ht 5\' 3"  (1.6 m)   Wt 192 lb 6.4 oz (87.3 kg)   SpO2 99%   BMI 34.08 kg/m   GENERAL: Obese woman, no acute distress.  Fully ambulatory.  Anxious.  No conversational dyspnea. HEAD: Normocephalic, atraumatic.  EYES: Pupils equal, round, reactive to light.  No scleral icterus.  MOUTH: Nose/mouth/throat not examined due to masking requirements for COVID 19. NECK: Supple. No thyromegaly. Trachea midline. No JVD.  No adenopathy. PULMONARY: Good air entry bilaterally.  Coarse otherwise, no adventitious sounds. CARDIOVASCULAR: S1 and S2. Regular rate and rhythm.  No rubs, murmurs or gallops heard. ABDOMEN: Obese. MUSCULOSKELETAL: No joint deformity, no clubbing, no edema.  NEUROLOGIC: Neuro normal. SKIN: Intact,warm,dry. PSYCH: Mild pressured speech.  Normal behavior.      Assessment & Plan:     ICD-10-CM   1. COPD suggested by initial evaluation [facility])  J44.9 Pulmonary Function Test [facility] Only   Will assess with pulmonary function testing Discontinue Dulera and Combivent Trial of Breztri Albuterol as rescue  2. Shortness of breath  R06.02    Likely multifactorial Has significant coronary artery disease Has significant diastolic dysfunction Recently noted increased LVEDP  3. Accelerating angina ([place])  I20.0    Evaluated by cardiology Recent cardiac cath  4. Tobacco dependence due to cigarettes  F17.210    Patient counseled with regards to discontinuation of smoking Total counseling time 3 to 5 minutes   Orders Placed This Encounter  Procedures  . Pulmonary Function Test [facility] Only    Standing Status:   Future    Number of Occurrences:   1    Standing Expiration Date:   10/02/2021    Scheduling  Instructions:     3 weeks    Order Specific Question:   Full PFT: includes the following: basic spirometry, spirometry pre & post bronchodilator, diffusion capacity (DLCO), lung volumes    Answer:   Full PFT   Meds ordered this encounter  Medications  . Budeson-Glycopyrrol-Formoterol (BREZTRI AEROSPHERE) 160-9-4.8 MCG/ACT AERO    Sig: Inhale 2 puffs into the lungs in the morning and at bedtime.    Dispense:  5.9 g    Refill:  0    Order Specific Question:   Lot Number?    Answer:   [id number]    Order Specific Question:   Expiration Date?    Answer:   02/11/2022    Order Specific Question:   Manufacturer?    Answer:   [employer] [[id number]]    Order Specific Question:   Quantity    Answer:   2   We will see the patient in follow-up in 3 to 4 weeks time after PFTs are done.  She is to see me or the nurse practitioner at that time.  She is to contact us prior to that time should any new difficulties arise.  Renold Don, MD [facility]    *This note was dictated using voice recognition software/Dragon.  Despite best efforts to proofread, errors can occur which can change the meaning.  Any change was purely unintentional.

## 2020-10-02 NOTE — Telephone Encounter (Signed)
-----   Message from Loel Dubonnet, NP sent at 10/02/2020  4:28 PM EDT ----- Potassium was mildly low which is likely due to diuretic use. Await results of lipoprotein A. Recommend Potassium 46mEQ tomorrow then 45mEq daily and repeat BMP in 1 week.

## 2020-10-02 NOTE — Patient Instructions (Signed)
We are going to give you a trial of BREZTRI 2 puffs twice a day.  Make sure you rinse your mouth well after the Breztri.  DO NOT TAKE DULERA OR COMBIVENT WHILE TAKING THE BREZTRI.  You may use albuterol (ProAir) 2 puffs every 6 hours as needed if short of breath or wheezing.  We are getting breathing tests ordered.  We will see you back in 3 to 4 weeks you will see either me or the nurse practitioner at that time.

## 2020-10-03 LAB — LIPOPROTEIN A (LPA): Lipoprotein (a): 92.9 nmol/L — ABNORMAL HIGH (ref <75.0)

## 2020-10-05 ENCOUNTER — Encounter: Payer: Self-pay | Admitting: Internal Medicine

## 2020-10-05 ENCOUNTER — Telehealth (INDEPENDENT_AMBULATORY_CARE_PROVIDER_SITE_OTHER): Payer: Medicare Other | Admitting: Internal Medicine

## 2020-10-05 ENCOUNTER — Telehealth: Payer: Self-pay | Admitting: Internal Medicine

## 2020-10-05 VITALS — Ht 63.0 in | Wt 192.0 lb

## 2020-10-05 DIAGNOSIS — R252 Cramp and spasm: Secondary | ICD-10-CM | POA: Diagnosis not present

## 2020-10-05 DIAGNOSIS — E612 Magnesium deficiency: Secondary | ICD-10-CM

## 2020-10-05 DIAGNOSIS — E538 Deficiency of other specified B group vitamins: Secondary | ICD-10-CM | POA: Diagnosis not present

## 2020-10-05 DIAGNOSIS — M722 Plantar fascial fibromatosis: Secondary | ICD-10-CM | POA: Diagnosis not present

## 2020-10-05 DIAGNOSIS — R748 Abnormal levels of other serum enzymes: Secondary | ICD-10-CM

## 2020-10-05 DIAGNOSIS — E559 Vitamin D deficiency, unspecified: Secondary | ICD-10-CM

## 2020-10-05 DIAGNOSIS — M62838 Other muscle spasm: Secondary | ICD-10-CM

## 2020-10-05 DIAGNOSIS — N760 Acute vaginitis: Secondary | ICD-10-CM

## 2020-10-05 DIAGNOSIS — Z1231 Encounter for screening mammogram for malignant neoplasm of breast: Secondary | ICD-10-CM | POA: Diagnosis not present

## 2020-10-05 DIAGNOSIS — R7303 Prediabetes: Secondary | ICD-10-CM | POA: Diagnosis not present

## 2020-10-05 DIAGNOSIS — E876 Hypokalemia: Secondary | ICD-10-CM

## 2020-10-05 DIAGNOSIS — Z113 Encounter for screening for infections with a predominantly sexual mode of transmission: Secondary | ICD-10-CM

## 2020-10-05 NOTE — Progress Notes (Addendum)
Virtual Visit via Video Note  I connected with Shawna Anderson  on 10/05/20 at  2:30 PM EDT by a video enabled telemedicine application and verified that I am speaking with the correct person using two identifiers.  Location patient: laundry mat Location provider:work or home office Persons participating in the virtual visit: patient, provider  I discussed the limitations of evaluation and management by telemedicine and the availability of in person appointments. The patient expressed understanding and agreed to proceed.   HPI: 1. C/o muscle cramps/spasms since on lasix 20 mg qd and benicar hct 40-25 mg qd K was 3.3 she is taking K and will get labs 10/09/20 she is having cramps/spasms from neck to back to bottom of her feet 2. C/o vaginal discharge and wants to be checked stds via urine H/o trichomonias 11/15/19  ROS: See pertinent positives and negatives per HPI.  Past Medical History:  Diagnosis Date  . Anxiety   . Asthma   . Bipolar 1 disorder ([place])   . Bipolar disorder ([place])   . CAD (coronary artery disease)    s/p stent BMS OM Cx  . Cervical herniated disc 04/12/2016  . COPD (chronic obstructive pulmonary disease) ([place])   . Depression   . Diabetes mellitus without complication ([place])   . Diverticulitis   . GERD (gastroesophageal reflux disease)   . Glaucoma   . History of blood transfusion   . Hyperlipidemia   . Hypertension   . OSA (obstructive sleep apnea)    not using cpap   . Plantar fasciitis    b/l feet s/p steroid shots w/o help and left surgery w/o help   . UTI (urinary tract infection)     Past Surgical History:  Procedure Laterality Date  . ABDOMINAL HYSTERECTOMY    . ABDOMINAL SURGERY  1995   Bowel resection.  Marland Kitchen CARDIAC CATHETERIZATION N/A 04/14/2016   Procedure: Left Heart Cath and Coronary Angiography;  Surgeon: Burnell Blanks, MD;  Location: [place] CV LAB;  Service: Cardiovascular;  Laterality: N/A;  . CARDIAC SURGERY    . COLONOSCOPY  WITH PROPOFOL N/A 07/11/2019   Procedure: COLONOSCOPY WITH PROPOFOL;  Surgeon: Jonathon Bellows, MD;  Location: [facility] ENDOSCOPY;  Service: Gastroenterology;  Laterality: N/A;  . COLONOSCOPY WITH PROPOFOL N/A 07/29/2019   Procedure: COLONOSCOPY WITH PROPOFOL;  Surgeon: Jonathon Bellows, MD;  Location: [facility] ENDOSCOPY;  Service: Gastroenterology;  Laterality: N/A;  . CORONARY ANGIOPLASTY WITH STENT PLACEMENT  2010   Drug eluting stent  . ESOPHAGOGASTRODUODENOSCOPY (EGD) WITH PROPOFOL N/A 07/11/2019   Procedure: ESOPHAGOGASTRODUODENOSCOPY (EGD) WITH PROPOFOL;  Surgeon: Jonathon Bellows, MD;  Location: [facility] ENDOSCOPY;  Service: Gastroenterology;  Laterality: N/A;  . LEFT HEART CATH AND CORONARY ANGIOGRAPHY Left 09/11/2020   Procedure: LEFT HEART CATH AND CORONARY ANGIOGRAPHY;  Surgeon: Nelva Bush, MD;  Location: [place] CV LAB;  Service: Cardiovascular;  Laterality: Left;  . OVARIAN CYST REMOVAL       Current Outpatient Medications:  .  amLODipine (NORVASC) 10 MG tablet, Take 1 tablet (10 mg total) by mouth at bedtime., Disp: 90 tablet, Rfl: 3 .  aspirin 81 MG chewable tablet, Chew 81 mg by mouth daily., Disp: , Rfl:  .  atorvastatin (LIPITOR) 80 MG tablet, Take 1 tablet (80 mg total) by mouth daily at 6 PM., Disp: 90 tablet, Rfl: 3 .  Budeson-Glycopyrrol-Formoterol (BREZTRI AEROSPHERE) 160-9-4.8 MCG/ACT AERO, Inhale 2 puffs into the lungs in the morning and at bedtime., Disp: 5.9 g, Rfl: 0 .  clopidogrel (PLAVIX) 75 MG  tablet, Take 1 tablet (75 mg total) by mouth daily., Disp: 90 tablet, Rfl: 3 .  divalproex (DEPAKOTE) 500 MG DR tablet, TAKE 1 TABLET BY MOUTH  TWICE DAILY, Disp: 180 tablet, Rfl: 3 .  ezetimibe (ZETIA) 10 MG tablet, Take 1 tablet (10 mg total) by mouth daily. With lipitor 80 mg at night, Disp: 90 tablet, Rfl: 1 .  furosemide (LASIX) 20 MG tablet, Take 1 tablet (20 mg total) by mouth daily., Disp: 30 tablet, Rfl: 2 .  isosorbide mononitrate (IMDUR) 60 MG 24 hr tablet, Take 1 tablet (60 mg  total) by mouth daily., Disp: 90 tablet, Rfl: 1 .  mometasone-formoterol (DULERA) 100-5 MCG/ACT AERO, Inhale 2 puffs into the lungs in the morning and at bedtime. Rinse mouth, Disp: 1 each, Rfl: 0 .  olmesartan-hydrochlorothiazide (BENICAR HCT) 40-25 MG tablet, Take 1 tablet by mouth daily., Disp: 90 tablet, Rfl: 3 .  pantoprazole (PROTONIX) 40 MG tablet, TAKE 1 TABLET BY MOUTH  TWICE DAILY BEFORE MEALS, Disp: 180 tablet, Rfl: 3 .  potassium chloride 20 MEQ TBCR, Take 2 tablets (73mEq) today, then take 1 tablet (27mEq) daily for 7 days., Disp: 9 tablet, Rfl: 0 .  traZODone (DESYREL) 150 MG tablet, Take 1 tablet (150 mg total) by mouth at bedtime as needed for sleep., Disp: 90 tablet, Rfl: 3  EXAM:  VITALS per patient if applicable:  GENERAL: alert, oriented, appears well and in no acute distress  HEENT: atraumatic, conjunttiva clear, no obvious abnormalities on inspection of external nose and ears  NECK: normal movements of the head and neck  LUNGS: on inspection no signs of respiratory distress, breathing rate appears normal, no obvious gross SOB, gasping or wheezing  CV: no obvious cyanosis  MS: moves all visible extremities without noticeable abnormality  PSYCH/NEURO: pleasant and cooperative, no obvious depression or anxiety, speech and thought processing grossly intact  ASSESSMENT AND PLAN:  Discussed the following assessment and plan:  Hypokalemia - Plan: Comprehensive metabolic panel Magnesium deficiency - Plan: Magnesium  Muscle cramp - Plan: CK (Creatine Kinase), TSH Muscle spasm disorder of tensor tympani, unspecified laterality - Plan: CK (Creatine Kinase), TSH Increase water intake 50 ounces daily   Elevated liver enzymes - Plan: Comprehensive metabolic panel  X83 deficiency - Plan: Vitamin B12  Vitamin D deficiency - Plan: Vitamin D (25 hydroxy)  Prediabetes - Plan: Hemoglobin A1c  Venereal disease screening with h/o trich 11/15/19  Acute vaginitis - Plan:  Urine cytology ancillary only([place])  As of 10/15/20 per Denisa RN pt requests referral plantar fascitis pain worsening   HM Declines flu shot  Tdapwill do in future Consider pna 23, shingrix and in future if has not had covid had 2/2 bring covid card or my chart this for 2nd dose date  S/p hysterectomy will ask at f/u if had abnormal pap? If left ovary still intact right ovary appears out per imaging  -established with [facility]  Dr. Vicente Males Kirancolonoscopyhad12/28/20 and 1/15/21and EGDhad 07/11/19 with path no malignancy concern H/o sigmoid resection in past for h/o diverticulitis 07/29/19 colonoscopy neg bx f/u in 10 years   -mammo due 12/14/20   mammogram 12/15/19  IMPRESSION: 1. No mammographic or ultrasound evidence for malignancy. 2. Possible focal area of fat necrosis in the 12:30 o'clock location of the RIGHT breast, warranting follow-up.  RECOMMENDATION: Recommend RIGHT breast ultrasound in 6 months to assess stability.  rec smoking cessation smoking < or = 0.5 ppd also using THCsince age 41 y.o  -we discussed possible serious  and likely etiologies, options for evaluation and workup, limitations of telemedicine visit vs in person visit, treatment, treatment risks and precautions    I discussed the assessment and treatment plan with the patient. The patient was provided an opportunity to ask questions and all were answered. The patient agreed with the plan and demonstrated an understanding of the instructions.    Time 20 min Delorise Jackson, MD

## 2020-10-05 NOTE — Progress Notes (Signed)
Low potassium levels. Muscle spasms in the back and feet.

## 2020-10-05 NOTE — Telephone Encounter (Signed)
Patient called back in and was seen

## 2020-10-05 NOTE — Telephone Encounter (Signed)
Left message to return call to start 2:30 visit

## 2020-10-09 ENCOUNTER — Ambulatory Visit: Payer: Medicare Other

## 2020-10-09 NOTE — Progress Notes (Deleted)
Patient ID: Shawna Anderson                 DOB: December 19, 1965                    MRN: [id number]     HPI: Shawna Anderson is a 55 y.o. female patient of Dr. Saunders Revel referred to lipid clinic by Brunetta Genera, NP. PMH is significant for CAD (2010 BMS to LCx: 2017 restenosis of LCx), HTN, HLD, GERD, sleep apnea, asthma, current smoker with both tobacco and marijuana, bipolar/anxiety. She reports her elevated cholesterol is genetic. 2 years ago her LDL was 193.  [employer] dual complete  Current Medications: atorvastatin 80mg  daily, ezetimibe 10mg  daily Intolerances:  Risk Factors: premature ASCVD, LDL >190 LDL goal: <55  Diet:   Exercise:   Family History:   Social History:   Labs:08/30/2020 total cholesterol 230, HDL 52, direct LDL 148, triglycerides 166 10/02/20: LPa 92.9   Past Medical History:  Diagnosis Date  . Anxiety   . Asthma   . Bipolar 1 disorder ([place])   . Bipolar disorder ([place])   . CAD (coronary artery disease)    s/p stent BMS OM Cx  . Cervical herniated disc 04/12/2016  . COPD (chronic obstructive pulmonary disease) ([place])   . Depression   . Diabetes mellitus without complication ([place])   . Diverticulitis   . GERD (gastroesophageal reflux disease)   . Glaucoma   . History of blood transfusion   . Hyperlipidemia   . Hypertension   . OSA (obstructive sleep apnea)    not using cpap   . Plantar fasciitis    b/l feet s/p steroid shots w/o help and left surgery w/o help   . UTI (urinary tract infection)     Current Outpatient Medications on File Prior to Visit  Medication Sig Dispense Refill  . amLODipine (NORVASC) 10 MG tablet Take 1 tablet (10 mg total) by mouth at bedtime. 90 tablet 3  . aspirin 81 MG chewable tablet Chew 81 mg by mouth daily.    Marland Kitchen atorvastatin (LIPITOR) 80 MG tablet Take 1 tablet (80 mg total) by mouth daily at 6 PM. 90 tablet 3  . Budeson-Glycopyrrol-Formoterol (BREZTRI AEROSPHERE) 160-9-4.8 MCG/ACT AERO Inhale 2 puffs into the lungs in the  morning and at bedtime. 5.9 g 0  . clopidogrel (PLAVIX) 75 MG tablet Take 1 tablet (75 mg total) by mouth daily. 90 tablet 3  . divalproex (DEPAKOTE) 500 MG DR tablet TAKE 1 TABLET BY MOUTH  TWICE DAILY 180 tablet 3  . ezetimibe (ZETIA) 10 MG tablet Take 1 tablet (10 mg total) by mouth daily. With lipitor 80 mg at night 90 tablet 1  . furosemide (LASIX) 20 MG tablet Take 1 tablet (20 mg total) by mouth daily. 30 tablet 2  . isosorbide mononitrate (IMDUR) 60 MG 24 hr tablet Take 1 tablet (60 mg total) by mouth daily. 90 tablet 1  . mometasone-formoterol (DULERA) 100-5 MCG/ACT AERO Inhale 2 puffs into the lungs in the morning and at bedtime. Rinse mouth 1 each 0  . olmesartan-hydrochlorothiazide (BENICAR HCT) 40-25 MG tablet Take 1 tablet by mouth daily. 90 tablet 3  . pantoprazole (PROTONIX) 40 MG tablet TAKE 1 TABLET BY MOUTH  TWICE DAILY BEFORE MEALS 180 tablet 3  . potassium chloride 20 MEQ TBCR Take 2 tablets (32mEq) today, then take 1 tablet (36mEq) daily for 7 days. 9 tablet 0  . traZODone (DESYREL) 150 MG tablet Take 1 tablet (150  mg total) by mouth at bedtime as needed for sleep. 90 tablet 3   No current facility-administered medications on file prior to visit.    Allergies  Allergen Reactions  . Ativan [Lorazepam]     Pt states it makes her tongue do weird things   . Latex Other (See Comments)    Patient stated that she was told by her doctor that she is "allergic to" this  . Tape Other (See Comments)    Patient stated that she was told by her doctor that she is "allergic to" this  . Xifaxan [Rifaximin]     Pt believes this is contributing to her abdominal pain/discomfort  . Drixoral Allergy Sinus [Dexbromphen-Pse-Apap Er] Rash    Assessment/Plan:  1. Hyperlipidemia -    Thank you,   Ramond Dial, Pharm.D, BCPS, CPP [place]  [address] [place], [place] [address]  Phone: [phone]; Fax: [phone]

## 2020-10-11 ENCOUNTER — Encounter: Payer: Self-pay | Admitting: Internal Medicine

## 2020-10-12 ENCOUNTER — Other Ambulatory Visit
Admission: RE | Admit: 2020-10-12 | Discharge: 2020-10-12 | Disposition: A | Discharge status: Home / Self Care | Payer: Medicare Other | Attending: Internal Medicine | Admitting: Internal Medicine

## 2020-10-12 ENCOUNTER — Other Ambulatory Visit: Payer: Self-pay

## 2020-10-12 DIAGNOSIS — E559 Vitamin D deficiency, unspecified: Secondary | ICD-10-CM | POA: Diagnosis not present

## 2020-10-12 DIAGNOSIS — M62838 Other muscle spasm: Secondary | ICD-10-CM | POA: Insufficient documentation

## 2020-10-12 DIAGNOSIS — R748 Abnormal levels of other serum enzymes: Secondary | ICD-10-CM | POA: Insufficient documentation

## 2020-10-12 DIAGNOSIS — R252 Cramp and spasm: Secondary | ICD-10-CM | POA: Diagnosis not present

## 2020-10-12 DIAGNOSIS — E876 Hypokalemia: Secondary | ICD-10-CM | POA: Insufficient documentation

## 2020-10-12 DIAGNOSIS — N760 Acute vaginitis: Secondary | ICD-10-CM | POA: Diagnosis present

## 2020-10-12 DIAGNOSIS — R7303 Prediabetes: Secondary | ICD-10-CM | POA: Insufficient documentation

## 2020-10-12 DIAGNOSIS — E612 Magnesium deficiency: Secondary | ICD-10-CM | POA: Insufficient documentation

## 2020-10-12 DIAGNOSIS — E538 Deficiency of other specified B group vitamins: Secondary | ICD-10-CM | POA: Diagnosis not present

## 2020-10-12 LAB — COMPREHENSIVE METABOLIC PANEL
ALT: 22 U/L (ref 0–44)
AST: 22 U/L (ref 15–41)
Albumin: 4.2 g/dL (ref 3.5–5.0)
Alkaline Phosphatase: 68 U/L (ref 38–126)
Anion gap: 12 (ref 5–15)
BUN: 26 mg/dL — ABNORMAL HIGH (ref 6–20)
CO2: 25 mmol/L (ref 22–32)
Calcium: 9 mg/dL (ref 8.9–10.3)
Chloride: 100 mmol/L (ref 98–111)
Creatinine, Ser: 1.12 mg/dL — ABNORMAL HIGH (ref 0.44–1.00)
GFR, Estimated: 59 mL/min — ABNORMAL LOW (ref >60)
Glucose, Bld: 127 mg/dL — ABNORMAL HIGH (ref 70–99)
Potassium: 3.6 mmol/L (ref 3.5–5.1)
Sodium: 137 mmol/L (ref 135–145)
Total Bilirubin: 0.6 mg/dL (ref 0.3–1.2)
Total Protein: 7.4 g/dL (ref 6.5–8.1)

## 2020-10-12 LAB — VITAMIN B12: Vitamin B-12: 385 pg/mL (ref 180–914)

## 2020-10-12 LAB — HEMOGLOBIN A1C
Hgb A1c MFr Bld: 6.8 % — ABNORMAL HIGH (ref 4.8–5.6)
Mean Plasma Glucose: 148.46 mg/dL

## 2020-10-12 LAB — MAGNESIUM: Magnesium: 2.5 mg/dL — ABNORMAL HIGH (ref 1.7–2.4)

## 2020-10-12 LAB — CK: Total CK: 196 U/L (ref 38–234)

## 2020-10-12 LAB — VITAMIN D 25 HYDROXY (VIT D DEFICIENCY, FRACTURES): Vit D, 25-Hydroxy: 21.06 ng/mL — ABNORMAL LOW (ref 30–100)

## 2020-10-12 LAB — TSH: TSH: 1.34 u[IU]/mL (ref 0.350–4.500)

## 2020-10-15 ENCOUNTER — Telehealth: Payer: Self-pay

## 2020-10-15 ENCOUNTER — Ambulatory Visit (INDEPENDENT_AMBULATORY_CARE_PROVIDER_SITE_OTHER): Payer: Medicare Other

## 2020-10-15 ENCOUNTER — Other Ambulatory Visit: Payer: Medicare Other

## 2020-10-15 VITALS — Ht 63.0 in | Wt 192.0 lb

## 2020-10-15 DIAGNOSIS — Z1211 Encounter for screening for malignant neoplasm of colon: Secondary | ICD-10-CM | POA: Diagnosis not present

## 2020-10-15 DIAGNOSIS — Z0000 Encounter for general adult medical examination without abnormal findings: Secondary | ICD-10-CM | POA: Diagnosis not present

## 2020-10-15 NOTE — Addendum Note (Signed)
Addended by: Orland Mustard on: 10/15/2020 12:17 PM   Modules accepted: Orders

## 2020-10-15 NOTE — Patient Instructions (Addendum)
Shawna Anderson , Thank you for taking time to come for your Medicare Wellness Visit. I appreciate your ongoing commitment to your health goals. Please review the following plan we discussed and let me know if I can assist you in the future.   These are the goals we discussed: Goals    . Healthy diet     Stay active Stay hydrated       This is a list of the screening recommended for you and due dates:  Health Maintenance  Topic Date Due  . Colon Cancer Screening  07/28/2020  . Tetanus Vaccine  10/15/2021*  . COVID-19 Vaccine (4 - Booster) 10/22/2020  . Flu Shot  02/11/2021  . Mammogram  12/14/2021  .  Hepatitis C: One time screening is recommended by Center for Disease Control  (CDC) for  adults born from [birth year] through 1964.   Completed  . HIV Screening  Completed  . HPV Vaccine  Aged Out  . Pap Smear  Discontinued  *Topic was postponed. The date shown is not the original due date.    Immunizations Immunization History  Administered Date(s) Administered  . PFIZER(Purple Top)SARS-COV-2 Vaccination 11/04/2019, 11/18/2019, 04/23/2020   Advanced directives: not yet completed  Conditions/risks identified: patient requests referral for plantar fasciitis. Deferred to pcp for follow up.   Follow up in one year for your annual wellness visit.   Preventive Care 40-64 Years, Female Preventive care refers to lifestyle choices and visits with your health care provider that can promote health and wellness. What does preventive care include?  A yearly physical exam. This is also called an annual well check.  Dental exams once or twice a year.  Routine eye exams. Ask your health care provider how often you should have your eyes checked.  Personal lifestyle choices, including:  Daily care of your teeth and gums.  Regular physical activity.  Eating a healthy diet.  Avoiding tobacco and drug use.  Limiting alcohol use.  Practicing safe sex.  Taking low-dose aspirin daily  starting at age 78.  Taking vitamin and mineral supplements as recommended by your health care provider. What happens during an annual well check? The services and screenings done by your health care provider during your annual well check will depend on your age, overall health, lifestyle risk factors, and family history of disease. Counseling  Your health care provider may ask you questions about your:  Alcohol use.  Tobacco use.  Drug use.  Emotional well-being.  Home and relationship well-being.  Sexual activity.  Eating habits.  Work and work Astronomer.  Method of birth control.  Menstrual cycle.  Pregnancy history. Screening  You may have the following tests or measurements:  Height, weight, and BMI.  Blood pressure.  Lipid and cholesterol levels. These may be checked every 5 years, or more frequently if you are over 1 years old.  Skin check.  Lung cancer screening. You may have this screening every year starting at age 13 if you have a 30-pack-year history of smoking and currently smoke or have quit within the past 15 years.  Fecal occult blood test (FOBT) of the stool. You may have this test every year starting at age 56.  Flexible sigmoidoscopy or colonoscopy. You may have a sigmoidoscopy every 5 years or a colonoscopy every 10 years starting at age 53.  Hepatitis C blood test.  Hepatitis B blood test.  Sexually transmitted disease (STD) testing.  Diabetes screening. This is done by checking your blood sugar (glucose)  after you have not eaten for a while (fasting). You may have this done every 1-3 years.  Mammogram. This may be done every 1-2 years. Talk to your health care provider about when you should start having regular mammograms. This may depend on whether you have a family history of breast cancer.  BRCA-related cancer screening. This may be done if you have a family history of breast, ovarian, tubal, or peritoneal cancers.  Pelvic exam and  Pap test. This may be done every 3 years starting at age 46. Starting at age 31, this may be done every 5 years if you have a Pap test in combination with an HPV test.  Bone density scan. This is done to screen for osteoporosis. You may have this scan if you are at high risk for osteoporosis. Discuss your test results, treatment options, and if necessary, the need for more tests with your health care provider. Vaccines  Your health care provider may recommend certain vaccines, such as:  Influenza vaccine. This is recommended every year.  Tetanus, diphtheria, and acellular pertussis (Tdap, Td) vaccine. You may need a Td booster every 10 years.  Zoster vaccine. You may need this after age 69.  Pneumococcal 13-valent conjugate (PCV13) vaccine. You may need this if you have certain conditions and were not previously vaccinated.  Pneumococcal polysaccharide (PPSV23) vaccine. You may need one or two doses if you smoke cigarettes or if you have certain conditions. Talk to your health care provider about which screenings and vaccines you need and how often you need them. This information is not intended to replace advice given to you by your health care provider. Make sure you discuss any questions you have with your health care provider. Document Released: 07/27/2015 Document Revised: 03/19/2016 Document Reviewed: 05/01/2015 Elsevier Interactive Patient Education  2017 [employer].    Fall Prevention in the Home Falls can cause injuries. They can happen to people of all ages. There are many things you can do to make your home safe and to help prevent falls. What can I do on the outside of my home?  Regularly fix the edges of walkways and driveways and fix any cracks.  Remove anything that might make you trip as you walk through a door, such as a raised step or threshold.  Trim any bushes or trees on the path to your home.  Use bright outdoor lighting.  Clear any walking paths of  anything that might make someone trip, such as rocks or tools.  Regularly check to see if handrails are loose or broken. Make sure that both sides of any steps have handrails.  Any raised decks and porches should have guardrails on the edges.  Have any leaves, snow, or ice cleared regularly.  Use sand or salt on walking paths during winter.  Clean up any spills in your garage right away. This includes oil or grease spills. What can I do in the bathroom?  Use night lights.  Install grab bars by the toilet and in the tub and shower. Do not use towel bars as grab bars.  Use non-skid mats or decals in the tub or shower.  If you need to sit down in the shower, use a plastic, non-slip stool.  Keep the floor dry. Clean up any water that spills on the floor as soon as it happens.  Remove soap buildup in the tub or shower regularly.  Attach bath mats securely with double-sided non-slip rug tape.  Do not have throw rugs  and other things on the floor that can make you trip. What can I do in the bedroom?  Use night lights.  Make sure that you have a light by your bed that is easy to reach.  Do not use any sheets or blankets that are too big for your bed. They should not hang down onto the floor.  Have a firm chair that has side arms. You can use this for support while you get dressed.  Do not have throw rugs and other things on the floor that can make you trip. What can I do in the kitchen?  Clean up any spills right away.  Avoid walking on wet floors.  Keep items that you use a lot in easy-to-reach places.  If you need to reach something above you, use a strong step stool that has a grab bar.  Keep electrical cords out of the way.  Do not use floor polish or wax that makes floors slippery. If you must use wax, use non-skid floor wax.  Do not have throw rugs and other things on the floor that can make you trip. What can I do with my stairs?  Do not leave any items on the  stairs.  Make sure that there are handrails on both sides of the stairs and use them. Fix handrails that are broken or loose. Make sure that handrails are as long as the stairways.  Check any carpeting to make sure that it is firmly attached to the stairs. Fix any carpet that is loose or worn.  Avoid having throw rugs at the top or bottom of the stairs. If you do have throw rugs, attach them to the floor with carpet tape.  Make sure that you have a light switch at the top of the stairs and the bottom of the stairs. If you do not have them, ask someone to add them for you. What else can I do to help prevent falls?  Wear shoes that:  Do not have high heels.  Have rubber bottoms.  Are comfortable and fit you well.  Are closed at the toe. Do not wear sandals.  If you use a stepladder:  Make sure that it is fully opened. Do not climb a closed stepladder.  Make sure that both sides of the stepladder are locked into place.  Ask someone to hold it for you, if possible.  Clearly mark and make sure that you can see:  Any grab bars or handrails.  First and last steps.  Where the edge of each step is.  Use tools that help you move around (mobility aids) if they are needed. These include:  Canes.  Walkers.  Scooters.  Crutches.  Turn on the lights when you go into a dark area. Replace any light bulbs as soon as they burn out.  Set up your furniture so you have a clear path. Avoid moving your furniture around.  If any of your floors are uneven, fix them.  If there are any pets around you, be aware of where they are.  Review your medicines with your doctor. Some medicines can make you feel dizzy. This can increase your chance of falling. Ask your doctor what other things that you can do to help prevent falls. This information is not intended to replace advice given to you by your health care provider. Make sure you discuss any questions you have with your health care  provider. Document Released: 04/26/2009 Document Revised: 12/06/2015 Document Reviewed: 08/04/2014 Elsevier  Education  2017 Elsevier Inc.  

## 2020-10-15 NOTE — Telephone Encounter (Signed)
Referred [facility]

## 2020-10-15 NOTE — Telephone Encounter (Signed)
Attempted to make patient aware. No answer. Unable to leave message.

## 2020-10-15 NOTE — Telephone Encounter (Signed)
Patient is aware of date/time of covid test prior to PFT.  

## 2020-10-15 NOTE — Telephone Encounter (Signed)
Patient requests referral for worsening plantar fasciitis. Last office visit 3/25. Deferred to pcp.

## 2020-10-15 NOTE — Progress Notes (Addendum)
Subjective:   Shawna Anderson is a 55 y.o. female who presents for Medicare Annual (Subsequent) preventive examination.  Review of Systems    No ROS.  Medicare Wellness Virtual Visit.    Cardiac Risk Factors include: advanced age (>63men, >47 women);hypertension     Objective:    Today's Vitals   10/15/20 0933  Weight: 192 lb (87.1 kg)  Height: 5\' 3"  (1.6 m)   Body mass index is 34.01 kg/m.  Advanced Directives 10/15/2020 09/11/2020 07/16/2020 05/22/2020 03/02/2020 12/16/2019 10/13/2019  Does Patient Have a Medical Advance Directive? No Yes Yes No No Yes Yes  Type of Advance Directive - [place];Living will [place];Living will - - - Press photographer  Does patient want to make changes to medical advance directive? - No - Patient declined No - Patient declined - - - No - Patient declined  Copy of [place] in Chart? - No - copy requested No - copy requested - - - No - copy requested  Would patient like information on creating a medical advance directive? No - Patient declined - No - Patient declined - No - Patient declined - -    Current Medications (verified) Outpatient Encounter Medications as of 10/15/2020  Medication Sig  . amLODipine (NORVASC) 10 MG tablet Take 1 tablet (10 mg total) by mouth at bedtime.  Marland Kitchen aspirin 81 MG chewable tablet Chew 81 mg by mouth daily.  Marland Kitchen atorvastatin (LIPITOR) 80 MG tablet Take 1 tablet (80 mg total) by mouth daily at 6 PM.  . Budeson-Glycopyrrol-Formoterol (BREZTRI AEROSPHERE) 160-9-4.8 MCG/ACT AERO Inhale 2 puffs into the lungs in the morning and at bedtime.  . clopidogrel (PLAVIX) 75 MG tablet Take 1 tablet (75 mg total) by mouth daily.  . divalproex (DEPAKOTE) 500 MG DR tablet TAKE 1 TABLET BY MOUTH  TWICE DAILY  . ezetimibe (ZETIA) 10 MG tablet Take 1 tablet (10 mg total) by mouth daily. With lipitor 80 mg at night  . furosemide (LASIX) 20 MG tablet Take 1 tablet (20 mg total)  by mouth daily.  . isosorbide mononitrate (IMDUR) 60 MG 24 hr tablet Take 1 tablet (60 mg total) by mouth daily.  . mometasone-formoterol (DULERA) 100-5 MCG/ACT AERO Inhale 2 puffs into the lungs in the morning and at bedtime. Rinse mouth  . olmesartan-hydrochlorothiazide (BENICAR HCT) 40-25 MG tablet Take 1 tablet by mouth daily.  . pantoprazole (PROTONIX) 40 MG tablet TAKE 1 TABLET BY MOUTH  TWICE DAILY BEFORE MEALS  . potassium chloride 20 MEQ TBCR Take 2 tablets (73mEq) today, then take 1 tablet (50mEq) daily for 7 days.  . traZODone (DESYREL) 150 MG tablet Take 1 tablet (150 mg total) by mouth at bedtime as needed for sleep.   No facility-administered encounter medications on file as of 10/15/2020.    Allergies (verified) Ativan [lorazepam], Latex, Tape, Xifaxan [rifaximin], and Drixoral allergy sinus [dexbromphen-pse-apap er]   History: Past Medical History:  Diagnosis Date  . Anxiety   . Asthma   . Bipolar 1 disorder ([place])   . Bipolar disorder ([place])   . CAD (coronary artery disease)    s/p stent BMS OM Cx  . Cervical herniated disc 04/12/2016  . COPD (chronic obstructive pulmonary disease) ([place])   . Depression   . Diabetes mellitus without complication ([place])   . Diverticulitis   . GERD (gastroesophageal reflux disease)   . Glaucoma   . History of blood transfusion   . Hyperlipidemia   .  Hypertension   . OSA (obstructive sleep apnea)    not using cpap   . Plantar fasciitis    b/l feet s/p steroid shots w/o help and left surgery w/o help   . UTI (urinary tract infection)    Past Surgical History:  Procedure Laterality Date  . ABDOMINAL HYSTERECTOMY    . ABDOMINAL SURGERY  1995   Bowel resection.  Marland Kitchen CARDIAC CATHETERIZATION N/A 04/14/2016   Procedure: Left Heart Cath and Coronary Angiography;  Surgeon: Burnell Blanks, MD;  Location: [facility];  Service: Cardiovascular;  Laterality: N/A;  . CARDIAC SURGERY    . COLONOSCOPY WITH PROPOFOL N/A 07/11/2019    Procedure: COLONOSCOPY WITH PROPOFOL;  Surgeon: Jonathon Bellows, MD;  Location: [facility];  Service: Gastroenterology;  Laterality: N/A;  . COLONOSCOPY WITH PROPOFOL N/A 07/29/2019   Procedure: COLONOSCOPY WITH PROPOFOL;  Surgeon: Jonathon Bellows, MD;  Location: [facility] ENDOSCOPY;  Service: Gastroenterology;  Laterality: N/A;  . CORONARY ANGIOPLASTY WITH STENT PLACEMENT  2010   Drug eluting stent  . ESOPHAGOGASTRODUODENOSCOPY (EGD) WITH PROPOFOL N/A 07/11/2019   Procedure: ESOPHAGOGASTRODUODENOSCOPY (EGD) WITH PROPOFOL;  Surgeon: Jonathon Bellows, MD;  Location: [facility] ENDOSCOPY;  Service: Gastroenterology;  Laterality: N/A;  . LEFT HEART CATH AND CORONARY ANGIOGRAPHY Left 09/11/2020   Procedure: LEFT HEART CATH AND CORONARY ANGIOGRAPHY;  Surgeon: Nelva Bush, MD;  Location: [facility];  Service: Cardiovascular;  Laterality: Left;  . OVARIAN CYST REMOVAL     Family History  Problem Relation Age of Onset  . CAD Mother   . Depression Mother   . Heart disease Mother   . Hyperlipidemia Mother   . Hypertension Mother   . CAD Brother   . Depression Brother   . Diabetes Brother   . Heart disease Brother   . Hyperlipidemia Brother   . Heart disease Father   . Alcohol abuse Father   . Lupus Other   . Sickle cell anemia Other    Social History   Socioeconomic History  . Marital status: Single    Spouse name: Not on file  . Number of children: 1  . Years of education: Not on file  . Highest education level: Not on file  Occupational History  . Not on file  Tobacco Use  . Smoking status: Current Every Day Smoker    Packs/day: 1.00    Years: 30.00    Pack years: 30.00    Types: Cigarettes    Last attempt to quit: 03/22/2020    Years since quitting: 0.5  . Smokeless tobacco: Never Used  . Tobacco comment: 0.5 PPD 10/02/2020  Vaping Use  . Vaping Use: Never used  Substance and Sexual Activity  . Alcohol use: Not Currently    Comment: once a year  . Drug use: Yes    Frequency: 7.0  times per week    Types: Marijuana  . Sexual activity: Not on file  Other Topics Concern  . Not on file  Social History Narrative   From [place] now living in [place]    1 son    No guns    Wears seat belt   Safe in relationship    Social Determinants of Health   Financial Resource Strain: Low Risk   . Difficulty of Paying Living Expenses: Not very hard  Food Insecurity: No Food Insecurity  . Worried About Charity fundraiser in the Last Year: Never true  . Ran Out of Food in the Last Year:  Never true  Transportation Needs: No Transportation Needs  . Lack of Transportation (Medical): No  . Lack of Transportation (Non-Medical): No  Physical Activity: Not on file  Stress: No Stress Concern Present  . Feeling of Stress : Not at all  Social Connections: Unknown  . Frequency of Communication with Friends and Family: More than three times a week  . Frequency of Social Gatherings with Friends and Family: More than three times a week  . Attends Religious Services: Not on file  . Active Member of Clubs or Organizations: Not on file  . Attends Archivist Meetings: Not on file  . Marital Status: Not on file    Tobacco Counseling Ready to quit: Not Answered Counseling given: Not Answered Comment: 0.5 PPD 10/02/2020   Clinical Intake:  Pre-visit preparation completed: Yes        Diabetes: Yes (Followed by pcp)  How often do you need to have someone help you when you read instructions, pamphlets, or other written materials from your doctor or pharmacy?: 1 - Never Interpreter Needed?: No      Activities of Daily Living In your present state of health, do you have any difficulty performing the following activities: 10/15/2020 09/11/2020  Hearing? N N  Vision? N N  Difficulty concentrating or making decisions? N N  Walking or climbing stairs? Y N  Dressing or bathing? N N  Doing errands, shopping? N -  Preparing Food and eating ? N -  Using the  Toilet? N -  In the past six months, have you accidently leaked urine? N -  Do you have problems with loss of bowel control? N -  Managing your Medications? N -  Managing your Finances? N -  Housekeeping or managing your Housekeeping? N -  Some recent data might be hidden    Patient Care Team: McLean-Scocuzza, Nino Glow, MD as PCP - General (Internal Medicine) End, Harrell Gave, MD as PCP - Cardiology (Cardiology)  Indicate any recent Medical Services you may have received from other than [facility] providers in the past year (date may be approximate).     Assessment:   This is a routine wellness examination for [facility].  I connected with Zayda today by telephone and verified that I am speaking with the correct person using two identifiers. Location patient: home Location provider: work Persons participating in the virtual visit: patient, Marine scientist.    I discussed the limitations, risks, security and privacy concerns of performing an evaluation and management service by telephone and the availability of in person appointments. The patient expressed understanding and verbally consented to this telephonic visit.    Interactive audio and video telecommunications were attempted between this provider and patient, however failed, due to patient having technical difficulties OR patient did not have access to video capability.  We continued and completed visit with audio only.  Some vital signs may be absent or patient reported.   Hearing/Vision screen  Hearing Screening   125Hz  250Hz  500Hz  1000Hz  2000Hz  3000Hz  4000Hz  6000Hz  8000Hz   Right ear:           Left ear:           Comments: Patient is able to hear conversational tones without difficulty.  No issues reported.  Vision Screening Comments: Wears corrective lenses Visual acuity not assessed, virtual visit.      Dietary issues and exercise activities discussed: Current Exercise Habits: The patient does not participate in regular exercise at  present  Soft diet; she tries to  monitor carb intake Trying to increase water intake   Goals    . Healthy diet     Stay active Stay hydrated      Depression Screen PHQ 2/9 Scores 10/15/2020 10/25/2019 10/13/2019 02/18/2019 12/29/2017  PHQ - 2 Score 0 1 1 6  0  PHQ- 9 Score - - - 13 -    Fall Risk Fall Risk  10/15/2020 10/05/2020 05/25/2020 03/23/2020 01/11/2020  Falls in the past year? 0 0 0 0 0  Number falls in past yr: 0 0 0 0 0  Injury with Fall? 0 0 0 0 0  Risk for fall due to : - - - History of fall(s) -  Follow up Falls evaluation completed Falls evaluation completed Falls evaluation completed Falls evaluation completed Falls evaluation completed    [place]: Handrails in use when climbing stairs?Yes Home free of loose throw rugs in walkways, pet beds, electrical cords, etc? Yes  Adequate lighting in your home to reduce risk of falls? Yes   ASSISTIVE DEVICES UTILIZED TO PREVENT FALLS: Use of a cane, walker or w/c? No   TIMED UP AND GO: Was the test performed? No . Virtual visit.   Cognitive Function:  Patient is alert and oriented x3.  Denies difficulty focusing, concentrating.  Enjoys playing online brain stimulating games.    6CIT Screen 10/15/2020 10/13/2019  What Year? 0 points 0 points  What month? 0 points 0 points  What time? 0 points 0 points  Count back from 20 - 0 points  Months in reverse 0 points 0 points  Repeat phrase - 0 points  Total Score - 0    Immunizations Immunization History  Administered Date(s) Administered  . PFIZER(Purple Top)SARS-COV-2 Vaccination 11/04/2019, 11/18/2019, 04/23/2020    TDAP status: Due, Education has been provided regarding the importance of this vaccine. Advised may receive this vaccine at local pharmacy or Health Dept. Aware to provide a copy of the vaccination record if obtained from local pharmacy or Health Dept. Verbalized acceptance and understanding. Deferred.   Health  Maintenance Health Maintenance  Topic Date Due  . COLONOSCOPY (Pts 45-31yrs Insurance coverage will need to be confirmed)  07/28/2020  . TETANUS/TDAP  10/15/2021 (Originally [birth year]1986)  . COVID-19 Vaccine (4 - Booster) 10/22/2020  . INFLUENZA VACCINE  02/11/2021  . MAMMOGRAM  12/14/2021  . Hepatitis C Screening  Completed  . HIV Screening  Completed  . HPV VACCINES  Aged Out  . PAP SMEAR-Modifier  Discontinued   Colorectal cancer screening: Type of screening: Colonoscopy. Completed 07/29/19. Repeat every 1 years. Ordered per health maintenance.    Mammogram status: Completed 12/15/19. Repeat every year.  DG Chest 2 View completed- 05/22/20  Vision Screening: Recommended annual ophthalmology exams for early detection of glaucoma and other disorders of the eye. Is the patient up to date with their annual eye exam?  Yes   Dental Screening: Recommended annual dental exams for proper oral hygiene. Partial.   Community Resource Referral / Chronic Care Management: CRR required this visit?  No   CCM required this visit?  No      Plan:   Keep all routine maintenance appointments.   I have personally reviewed and noted the following in the patient's chart:   . Medical and social history . Use of alcohol, tobacco or illicit drugs  . Current medications and supplements . Functional ability and status . Nutritional status . Physical activity . Advanced directives . List of other physicians .  Hospitalizations, surgeries, and ER visits in previous 12 months . Vitals . Screenings to include cognitive, depression, and falls . Referrals and appointments  In addition, I have reviewed and discussed with patient certain preventive protocols, quality metrics, and best practice recommendations. A written personalized care plan for preventive services as well as general preventive health recommendations were provided to patient via mychart.     Varney Biles, LPN   11/18/3460    Agree with plan. Mable Paris, NP

## 2020-10-15 NOTE — Telephone Encounter (Signed)
Good afternoon!  Referral received and sent. 

## 2020-10-16 ENCOUNTER — Ambulatory Visit: Payer: Medicare Other

## 2020-10-17 ENCOUNTER — Ambulatory Visit
Admission: RE | Admit: 2020-10-17 | Discharge: 2020-10-17 | Disposition: A | Discharge status: Home / Self Care | Payer: Medicare Other | Source: Ambulatory Visit | Attending: Pulmonary Disease | Admitting: Pulmonary Disease

## 2020-10-17 ENCOUNTER — Other Ambulatory Visit: Payer: Self-pay

## 2020-10-17 DIAGNOSIS — Z20822 Contact with and (suspected) exposure to covid-19: Secondary | ICD-10-CM | POA: Diagnosis not present

## 2020-10-17 DIAGNOSIS — Z01812 Encounter for preprocedural laboratory examination: Secondary | ICD-10-CM | POA: Diagnosis not present

## 2020-10-17 LAB — SARS CORONAVIRUS 2 (TAT 6-24 HRS): SARS Coronavirus 2: NEGATIVE

## 2020-10-18 ENCOUNTER — Ambulatory Visit: Payer: Medicare Other

## 2020-10-18 ENCOUNTER — Emergency Department
Admission: EM | Admit: 2020-10-18 | Discharge: 2020-10-18 | Disposition: A | Discharge status: Home / Self Care | Payer: Medicare Other | Attending: Emergency Medicine | Admitting: Emergency Medicine

## 2020-10-18 ENCOUNTER — Other Ambulatory Visit: Payer: Self-pay

## 2020-10-18 DIAGNOSIS — Z7982 Long term (current) use of aspirin: Secondary | ICD-10-CM | POA: Insufficient documentation

## 2020-10-18 DIAGNOSIS — M62830 Muscle spasm of back: Secondary | ICD-10-CM | POA: Diagnosis not present

## 2020-10-18 DIAGNOSIS — Z91040 Latex allergy status: Secondary | ICD-10-CM | POA: Insufficient documentation

## 2020-10-18 DIAGNOSIS — Z7902 Long term (current) use of antithrombotics/antiplatelets: Secondary | ICD-10-CM | POA: Insufficient documentation

## 2020-10-18 DIAGNOSIS — I25118 Atherosclerotic heart disease of native coronary artery with other forms of angina pectoris: Secondary | ICD-10-CM | POA: Insufficient documentation

## 2020-10-18 DIAGNOSIS — Z79899 Other long term (current) drug therapy: Secondary | ICD-10-CM | POA: Insufficient documentation

## 2020-10-18 DIAGNOSIS — M62838 Other muscle spasm: Secondary | ICD-10-CM | POA: Insufficient documentation

## 2020-10-18 DIAGNOSIS — J45909 Unspecified asthma, uncomplicated: Secondary | ICD-10-CM | POA: Insufficient documentation

## 2020-10-18 DIAGNOSIS — I10 Essential (primary) hypertension: Secondary | ICD-10-CM | POA: Diagnosis not present

## 2020-10-18 DIAGNOSIS — J449 Chronic obstructive pulmonary disease, unspecified: Secondary | ICD-10-CM | POA: Insufficient documentation

## 2020-10-18 DIAGNOSIS — E119 Type 2 diabetes mellitus without complications: Secondary | ICD-10-CM | POA: Insufficient documentation

## 2020-10-18 DIAGNOSIS — F17210 Nicotine dependence, cigarettes, uncomplicated: Secondary | ICD-10-CM | POA: Diagnosis not present

## 2020-10-18 DIAGNOSIS — Z7951 Long term (current) use of inhaled steroids: Secondary | ICD-10-CM | POA: Diagnosis not present

## 2020-10-18 LAB — COMPREHENSIVE METABOLIC PANEL
ALT: 18 U/L (ref 0–44)
AST: 22 U/L (ref 15–41)
Albumin: 4.3 g/dL (ref 3.5–5.0)
Alkaline Phosphatase: 68 U/L (ref 38–126)
Anion gap: 13 (ref 5–15)
BUN: 19 mg/dL (ref 6–20)
CO2: 29 mmol/L (ref 22–32)
Calcium: 9.4 mg/dL (ref 8.9–10.3)
Chloride: 94 mmol/L — ABNORMAL LOW (ref 98–111)
Creatinine, Ser: 1.21 mg/dL — ABNORMAL HIGH (ref 0.44–1.00)
GFR, Estimated: 54 mL/min — ABNORMAL LOW (ref >60)
Glucose, Bld: 112 mg/dL — ABNORMAL HIGH (ref 70–99)
Potassium: 3.8 mmol/L (ref 3.5–5.1)
Sodium: 136 mmol/L (ref 135–145)
Total Bilirubin: 0.5 mg/dL (ref 0.3–1.2)
Total Protein: 7.6 g/dL (ref 6.5–8.1)

## 2020-10-18 LAB — CBC WITH DIFFERENTIAL/PLATELET
Abs Immature Granulocytes: 0.04 10*3/uL (ref 0.00–0.07)
Basophils Absolute: 0.1 10*3/uL (ref 0.0–0.1)
Basophils Relative: 1 %
Eosinophils Absolute: 0.2 10*3/uL (ref 0.0–0.5)
Eosinophils Relative: 2 %
HCT: 37.9 % (ref 36.0–46.0)
Hemoglobin: 12.9 g/dL (ref 12.0–15.0)
Immature Granulocytes: 0 %
Lymphocytes Relative: 43 %
Lymphs Abs: 5.1 10*3/uL — ABNORMAL HIGH (ref 0.7–4.0)
MCH: 30.3 pg (ref 26.0–34.0)
MCHC: 34 g/dL (ref 30.0–36.0)
MCV: 89 fL (ref 80.0–100.0)
Monocytes Absolute: 1 10*3/uL (ref 0.1–1.0)
Monocytes Relative: 9 %
Neutro Abs: 5.5 10*3/uL (ref 1.7–7.7)
Neutrophils Relative %: 45 %
Platelets: 450 10*3/uL — ABNORMAL HIGH (ref 150–400)
RBC: 4.26 MIL/uL (ref 3.87–5.11)
RDW: 13.6 % (ref 11.5–15.5)
WBC: 11.9 10*3/uL — ABNORMAL HIGH (ref 4.0–10.5)
nRBC: 0 % (ref 0.0–0.2)

## 2020-10-18 LAB — VALPROIC ACID LEVEL: Valproic Acid Lvl: 116 ug/mL — ABNORMAL HIGH (ref 50.0–100.0)

## 2020-10-18 MED ORDER — METHOCARBAMOL 500 MG PO TABS: 500.0000 mg | ORAL_TABLET | Freq: Three times a day (TID) | ORAL | 0 refills | Status: AC | PRN

## 2020-10-18 MED ORDER — ALBUTEROL SULFATE (2.5 MG/3ML) 0.083% IN NEBU
2.5000 mg | INHALATION_SOLUTION | Freq: Once | RESPIRATORY_TRACT | Status: DC
  Filled 2020-10-18: qty 3

## 2020-10-18 MED ORDER — METHOCARBAMOL 500 MG PO TABS
1000.0000 mg | ORAL_TABLET | Freq: Once | ORAL | Status: AC
  Administered 2020-10-18: 1000 mg via ORAL
  Filled 2020-10-18: qty 2

## 2020-10-18 NOTE — ED Triage Notes (Signed)
Pt presents to ER c/o sharp, intermittent muscle spasms all over her body.  Pt states her spasms are mostly in her rib cage area but go all over her body and have been going on for over a month.  Pt writhing in pain in triage and will not sit still for VS.  Pt states pain is 10/10 squeezing pain.

## 2020-10-18 NOTE — ED Provider Notes (Signed)
[facility]-EMERGENCY DEPARTMENT  ____________________________________________  Time seen: Approximately 4:45 PM  I have reviewed the triage vital signs and the nursing notes.   HISTORY  Chief Complaint Spasms   Historian Patient     HPI Shawna Anderson is a 55 y.o. female presents to the emergency department with sharp spasms all over her body.  Patient states that she frequently experiences the spasms along her abdomen and feet and sometimes along her upper back.  She states that she has had these spasms for at least a month.  She states that her primary care provider has done some "tests" and has not been able to give her a reason for why she experiences the spasms.  She denies recent falls or traumas.  She states that she has been taking Tylenol and ibuprofen but has not been prescribed any other medications.  She is requesting medicine for the pain.  She denies chest pain, chest tightness or specific abdominal discomfort.  No vomiting or diarrhea.   Past Medical History:  Diagnosis Date  . Anxiety   . Asthma   . Bipolar 1 disorder ([place])   . Bipolar disorder ([place])   . CAD (coronary artery disease)    s/p stent BMS OM Cx  . Cervical herniated disc 04/12/2016  . COPD (chronic obstructive pulmonary disease) ([place])   . Depression   . Diabetes mellitus without complication ([place])   . Diverticulitis   . GERD (gastroesophageal reflux disease)   . Glaucoma   . History of blood transfusion   . Hyperlipidemia   . Hypertension   . OSA (obstructive sleep apnea)    not using cpap   . Plantar fasciitis    b/l feet s/p steroid shots w/o help and left surgery w/o help   . UTI (urinary tract infection)      Immunizations up to date:  Yes.     Past Medical History:  Diagnosis Date  . Anxiety   . Asthma   . Bipolar 1 disorder ([place])   . Bipolar disorder ([place])   . CAD (coronary artery disease)    s/p stent BMS OM Cx  . Cervical herniated disc 04/12/2016  . COPD (chronic  obstructive pulmonary disease) ([place])   . Depression   . Diabetes mellitus without complication ([place])   . Diverticulitis   . GERD (gastroesophageal reflux disease)   . Glaucoma   . History of blood transfusion   . Hyperlipidemia   . Hypertension   . OSA (obstructive sleep apnea)    not using cpap   . Plantar fasciitis    b/l feet s/p steroid shots w/o help and left surgery w/o help   . UTI (urinary tract infection)     Patient Active Problem List   Diagnosis Date Noted  . Accelerating angina ([place]) 09/11/2020  . Chronic nausea 03/26/2020  . SUI (stress urinary incontinence, female) 03/26/2020  . Bronchitis 03/26/2020  . Continuous LLQ abdominal pain 03/01/2020  . URI with cough and congestion 03/01/2020  . Esophageal dysphagia 01/26/2020  . Schatzki's ring of distal esophagus 01/26/2020  . Ecchymosis 01/11/2020  . Chronic pain of both knees 01/11/2020  . Bruising 12/05/2019  . Trichomonas infection 12/05/2019  . Elevated liver enzymes 11/15/2019  . Prediabetes 11/15/2019  . STD exposure 11/15/2019  . Anemia 11/15/2019  . Chronic bursitis of right shoulder 10/25/2019  . Chest wall pain 09/05/2019  . Poor dentition 08/18/2019  . Thrombocytosis 08/18/2019  . Leukocytosis 08/18/2019  . Anal lesion 08/18/2019  .  Female pelvic pain 05/26/2019  . Colitis 05/26/2019  . HLD (hyperlipidemia) 12/27/2018  . Vitamin D deficiency 12/24/2018  . Bipolar disorder ([place]) 12/30/2017  . OSA (obstructive sleep apnea) 12/30/2017  . Constipation 12/30/2017  . Coronary artery disease of native artery of native heart with stable angina pectoris ([place]) 12/30/2017  . Gastroesophageal reflux disease 12/30/2017  . Asthma 12/29/2017  . COPD (chronic obstructive pulmonary disease) ([place]) 12/29/2017  . Anxiety and depression 12/29/2017  . Insomnia 12/29/2017  . Chronic back pain 12/29/2017  . Skin lesion of back 12/29/2017  . CAD S/P CFX PCI 2010 04/15/2016  . Essential hypertension 04/15/2016  .  Noncompliance with medication regimen 04/15/2016  . Chest pain with moderate risk of acute coronary syndrome 04/12/2016  . Abdominal pain 01/12/2015  . Vomiting 01/12/2015  . Nausea and vomiting 01/12/2015    Past Surgical History:  Procedure Laterality Date  . ABDOMINAL HYSTERECTOMY    . ABDOMINAL SURGERY  1995   Bowel resection.  Marland Kitchen CARDIAC CATHETERIZATION N/A 04/14/2016   Procedure: Left Heart Cath and Coronary Angiography;  Surgeon: Burnell Blanks, MD;  Location: [facility];  Service: Cardiovascular;  Laterality: N/A;  . CARDIAC SURGERY    . COLONOSCOPY WITH PROPOFOL N/A 07/11/2019   Procedure: COLONOSCOPY WITH PROPOFOL;  Surgeon: Jonathon Bellows, MD;  Location: [facility] ENDOSCOPY;  Service: Gastroenterology;  Laterality: N/A;  . COLONOSCOPY WITH PROPOFOL N/A 07/29/2019   Procedure: COLONOSCOPY WITH PROPOFOL;  Surgeon: Jonathon Bellows, MD;  Location: [facility] ENDOSCOPY;  Service: Gastroenterology;  Laterality: N/A;  . CORONARY ANGIOPLASTY WITH STENT PLACEMENT  2010   Drug eluting stent  . ESOPHAGOGASTRODUODENOSCOPY (EGD) WITH PROPOFOL N/A 07/11/2019   Procedure: ESOPHAGOGASTRODUODENOSCOPY (EGD) WITH PROPOFOL;  Surgeon: Jonathon Bellows, MD;  Location: [facility] ENDOSCOPY;  Service: Gastroenterology;  Laterality: N/A;  . LEFT HEART CATH AND CORONARY ANGIOGRAPHY Left 09/11/2020   Procedure: LEFT HEART CATH AND CORONARY ANGIOGRAPHY;  Surgeon: Nelva Bush, MD;  Location: [facility];  Service: Cardiovascular;  Laterality: Left;  . OVARIAN CYST REMOVAL      Prior to Admission medications   Medication Sig Start Date End Date Taking? Authorizing Provider  methocarbamol (ROBAXIN) 500 MG tablet Take 1 tablet (500 mg total) by mouth every 8 (eight) hours as needed for up to 5 days. 10/18/20 10/23/20 Yes Vallarie Mare M, PA-C  amLODipine (NORVASC) 10 MG tablet Take 1 tablet (10 mg total) by mouth at bedtime. 04/16/20   McLean-Scocuzza, Nino Glow, MD  aspirin 81 MG chewable tablet Chew 81 mg by  mouth daily.    [provider]  atorvastatin (LIPITOR) 80 MG tablet Take 1 tablet (80 mg total) by mouth daily at 6 PM. 04/16/20 07/15/20  McLean-Scocuzza, Nino Glow, MD  Budeson-Glycopyrrol-Formoterol (BREZTRI AEROSPHERE) 160-9-4.8 MCG/ACT AERO Inhale 2 puffs into the lungs in the morning and at bedtime. 10/02/20   Tyler Pita, MD  clopidogrel (PLAVIX) 75 MG tablet Take 1 tablet (75 mg total) by mouth daily. 04/16/20   McLean-Scocuzza, Nino Glow, MD  divalproex (DEPAKOTE) 500 MG DR tablet TAKE 1 TABLET BY MOUTH  TWICE DAILY 08/16/20   McLean-Scocuzza, Nino Glow, MD  ezetimibe (ZETIA) 10 MG tablet Take 1 tablet (10 mg total) by mouth daily. With lipitor 80 mg at night 07/17/20   McLean-Scocuzza, Nino Glow, MD  furosemide (LASIX) 20 MG tablet Take 1 tablet (20 mg total) by mouth daily. 09/11/20 09/11/21  End, Harrell Gave, MD  isosorbide mononitrate (IMDUR) 60 MG 24 hr tablet Take 1 tablet (60  mg total) by mouth daily. 09/19/20 03/18/21  Loel Dubonnet, NP  mometasone-formoterol (DULERA) 100-5 MCG/ACT AERO Inhale 2 puffs into the lungs in the morning and at bedtime. Rinse mouth 05/25/20   McLean-Scocuzza, Nino Glow, MD  olmesartan-hydrochlorothiazide (BENICAR HCT) 40-25 MG tablet Take 1 tablet by mouth daily. 02/05/20   McLean-Scocuzza, Nino Glow, MD  pantoprazole (PROTONIX) 40 MG tablet TAKE 1 TABLET BY MOUTH  TWICE DAILY BEFORE MEALS 01/31/20   Jonathon Bellows, MD  potassium chloride 20 MEQ TBCR Take 2 tablets (69mEq) today, then take 1 tablet (62mEq) daily for 7 days. 10/02/20   Loel Dubonnet, NP  traZODone (DESYREL) 150 MG tablet Take 1 tablet (150 mg total) by mouth at bedtime as needed for sleep. 04/16/20   McLean-Scocuzza, Nino Glow, MD    Allergies Ativan [lorazepam], Latex, Tape, Xifaxan [rifaximin], and Drixoral allergy sinus [dexbromphen-pse-apap er]  Family History  Problem Relation Age of Onset  . CAD Mother   . Depression Mother   . Heart disease Mother   . Hyperlipidemia Mother   . Hypertension  Mother   . CAD Brother   . Depression Brother   . Diabetes Brother   . Heart disease Brother   . Hyperlipidemia Brother   . Heart disease Father   . Alcohol abuse Father   . Lupus Other   . Sickle cell anemia Other     Social History Social History   Tobacco Use  . Smoking status: Current Every Day Smoker    Packs/day: 1.00    Years: 30.00    Pack years: 30.00    Types: Cigarettes    Last attempt to quit: 03/22/2020    Years since quitting: 0.5  . Smokeless tobacco: Never Used  . Tobacco comment: 0.5 PPD 10/02/2020  Vaping Use  . Vaping Use: Never used  Substance Use Topics  . Alcohol use: Not Currently    Comment: once a year  . Drug use: Yes    Frequency: 7.0 times per week    Types: Marijuana     Review of Systems  Constitutional: No fever/chills Eyes:  No discharge ENT: No upper respiratory complaints. Respiratory: no cough. No SOB/ use of accessory muscles to breath Gastrointestinal:   No nausea, no vomiting.  No diarrhea.  No constipation. Musculoskeletal: Patient has muscle spasms.  Skin: Negative for rash, abrasions, lacerations, ecchymosis.    ____________________________________________   PHYSICAL EXAM:  VITAL SIGNS: ED Triage Vitals  Enc Vitals Group     BP 10/18/20 1635 (!) 147/92     Pulse Rate 10/18/20 1635 (!) 101     Resp 10/18/20 1635 20     Temp 10/18/20 1635 98.6 F (37 C)     Temp Source 10/18/20 1635 Oral     SpO2 10/18/20 1635 98 %     Weight 10/18/20 1632 192 lb (87.1 kg)     Height 10/18/20 1632 5\' 3"  (1.6 m)     Head Circumference --      Peak Flow --      Pain Score 10/18/20 1631 10     Pain Loc --      Pain Edu? --      Excl. in [place]? --      Constitutional: Alert and oriented. Well appearing and in no acute distress. Eyes: Conjunctivae are normal. PERRL. EOMI. Head: Atraumatic. ENT:      Nose: No congestion/rhinnorhea.      Mouth/Throat: Mucous membranes are moist.  Neck: No stridor.  No  cervical spine tenderness  to palpation. Hematological/Lymphatic/Immunilogical: No cervical lymphadenopathy. Cardiovascular: Normal rate, regular rhythm. Normal S1 and S2.  Good peripheral circulation. Respiratory: Normal respiratory effort without tachypnea or retractions. Lungs CTAB. Good air entry to the bases with no decreased or absent breath sounds Gastrointestinal: Bowel sounds x 4 quadrants. Soft and nontender to palpation. No guarding or rigidity. No distention. Musculoskeletal: Full range of motion to all extremities. No obvious deformities noted Neurologic:  Normal for age. No gross focal neurologic deficits are appreciated.  Skin:  Skin is warm, dry and intact. No rash noted. Psychiatric: Mood and affect are normal for age. Speech and behavior are normal.   ____________________________________________   LABS (all labs ordered are listed, but only abnormal results are displayed)  Labs Reviewed  CBC WITH DIFFERENTIAL/PLATELET - Abnormal; Notable for the following components:      Result Value   WBC 11.9 (*)    Platelets 450 (*)    Lymphs Abs 5.1 (*)    All other components within normal limits  COMPREHENSIVE METABOLIC PANEL - Abnormal; Notable for the following components:   Chloride 94 (*)    Glucose, Bld 112 (*)    Creatinine, Ser 1.21 (*)    GFR, Estimated 54 (*)    All other components within normal limits  VALPROIC ACID LEVEL - Abnormal; Notable for the following components:   Valproic Acid Lvl 116 (*)    All other components within normal limits   ____________________________________________  EKG   ____________________________________________  RADIOLOGY   No results found.  ____________________________________________    PROCEDURES  Procedure(s) performed:     Procedures     Medications  methocarbamol (ROBAXIN) tablet 1,000 mg (1,000 mg Oral Given 10/18/20 1647)     ____________________________________________   INITIAL IMPRESSION / ASSESSMENT AND PLAN / ED  COURSE  Pertinent labs & imaging results that were available during my care of the patient were reviewed by me and considered in my medical decision making (see chart for details).      Assessment and Plan: Muscle spasms: 55 year old female presents to the emergency department with concern for muscle spasms that have been occurring for the past several months.  CBC and CMP were reassuring.  Patient reported that muscle spasms resolved after Robaxin was given in the emergency department.  Patient was prescribed a short course of Robaxin and advised to follow-up with her primary care provider as needed.  All patient questions were answered.    ____________________________________________  FINAL CLINICAL IMPRESSION(S) / ED DIAGNOSES  Final diagnoses:  Muscle spasm      NEW MEDICATIONS STARTED DURING THIS VISIT:  ED Discharge Orders         Ordered    methocarbamol (ROBAXIN) 500 MG tablet  Every 8 hours PRN        10/18/20 1758              This chart was dictated using voice recognition software/Dragon. Despite best efforts to proofread, errors can occur which can change the meaning. Any change was purely unintentional.     Karren Cobble 10/18/20 2059    Blake Divine, MD 10/18/20 [phone]

## 2020-10-18 NOTE — Discharge Instructions (Signed)
Take Robaxin up to three times daily as needed for muscle spasms.

## 2020-10-22 ENCOUNTER — Other Ambulatory Visit: Payer: Self-pay | Admitting: Internal Medicine

## 2020-10-22 ENCOUNTER — Other Ambulatory Visit: Payer: Self-pay | Admitting: Family

## 2020-10-22 DIAGNOSIS — M1990 Unspecified osteoarthritis, unspecified site: Secondary | ICD-10-CM

## 2020-10-22 DIAGNOSIS — N760 Acute vaginitis: Secondary | ICD-10-CM

## 2020-10-22 DIAGNOSIS — N179 Acute kidney failure, unspecified: Secondary | ICD-10-CM

## 2020-10-22 DIAGNOSIS — E876 Hypokalemia: Secondary | ICD-10-CM

## 2020-10-22 DIAGNOSIS — I25118 Atherosclerotic heart disease of native coronary artery with other forms of angina pectoris: Secondary | ICD-10-CM

## 2020-10-22 DIAGNOSIS — R609 Edema, unspecified: Secondary | ICD-10-CM

## 2020-10-22 MED ORDER — FUROSEMIDE 20 MG PO TABS
20.0000 mg | ORAL_TABLET | Freq: Every day | ORAL | 2 refills | Status: DC | PRN
Start: 2020-10-22 — End: 2020-12-19

## 2020-10-22 MED ORDER — POTASSIUM CHLORIDE ER 20 MEQ PO TBCR: 1.0000 | EXTENDED_RELEASE_TABLET | Freq: Every day | ORAL | 0 refills | Status: DC | PRN

## 2020-10-24 ENCOUNTER — Encounter: Payer: Self-pay | Admitting: Pharmacist

## 2020-10-24 ENCOUNTER — Other Ambulatory Visit: Payer: Self-pay

## 2020-10-24 ENCOUNTER — Telehealth: Payer: Self-pay | Admitting: Internal Medicine

## 2020-10-24 ENCOUNTER — Ambulatory Visit (INDEPENDENT_AMBULATORY_CARE_PROVIDER_SITE_OTHER): Payer: Medicare Other | Admitting: Pharmacist

## 2020-10-24 DIAGNOSIS — E785 Hyperlipidemia, unspecified: Secondary | ICD-10-CM | POA: Diagnosis not present

## 2020-10-24 MED ORDER — REPATHA SURECLICK 140 MG/ML ~~LOC~~ SOAJ: 1.0000 mL | SUBCUTANEOUS | 1 refills | Status: DC

## 2020-10-24 NOTE — Telephone Encounter (Signed)
-----   Message from Solmon Ice, RN sent at 10/24/2020 11:43 AM EDT ----- Regarding: follow up Wanted to make sure pt had 3 mo f/u scheduled (from ov 09/19/20).  Thanks!

## 2020-10-24 NOTE — Telephone Encounter (Signed)
Attempted to schedule no ans no vm  

## 2020-10-24 NOTE — Progress Notes (Signed)
Patient ID: Shawna Anderson                 DOB: 11/12/1965                    MRN: [id number]     HPI: Shawna Anderson is a 55 y.o. female patient referred to lipid clinic by Laurann Montana. PMH is significant for   She was seen 08/30/2020 noting increasing episodes of chest pain with minimal exertion associated with dyspnea.  She had EKG showing new T wave inversion to lateral leads with high-sensitivity troponin unremarkable.  She was recommended for left heart cath and Imdur was increased to 60 mg daily. LHC 09/11/20 with moderately severe multivessel coronary disease with mild plaque in prox and distal LAD, 40% ISR of ostial LCx, 30% distal LCx lesion, focal 60% mid RCA stenosis. MOderately elevated LVEDP started on Lasix 20mg  daily. Noted difficulty with sedation during procedure.   Presents today for follow up.  She reports compliance with atorvastatin 80 mg daily and Zetia 10 mg daily. Brought her medications in to verify with chart.  Reports she has a strong family history of high cholesterol and CAD. Sister texted her and let her know that she previously had no luck on high dose atorvastatin but did better on Crestor.  Patient lives along and prepares her own food.  Reports she does not eat out much.  Current Medications: atorvastain 80mg , zetia 10mg  Intolerances: n/a Risk Factors: CAD, HTN,  LDL goal: <70  Social History: Reports she has quit smoking.  Rarely drinks alcohol.  Labs:  Lipo (a): 92.9 (10/02/20) - on atorvastatin 80, zetia 10mg   A1c 6.8 (10/12/20)  TC 230, Trigs 166, LDL 145 (08/30/20) - on atorvastatin 80, zetia 10mg   Direct LDL 148 (08/30/20) - on atorvastatin 80, zetia 10mg   Past Medical History:  Diagnosis Date  . Anxiety   . Asthma   . Bipolar 1 disorder ([place])   . Bipolar disorder ([place])   . CAD (coronary artery disease)    s/p stent BMS OM Cx  . Cervical herniated disc 04/12/2016  . COPD (chronic obstructive pulmonary disease) ([place])   . Depression    . Diabetes mellitus without complication ([place])   . Diverticulitis   . GERD (gastroesophageal reflux disease)   . Glaucoma   . History of blood transfusion   . Hyperlipidemia   . Hypertension   . OSA (obstructive sleep apnea)    not using cpap   . Plantar fasciitis    b/l feet s/p steroid shots w/o help and left surgery w/o help   . UTI (urinary tract infection)     Current Outpatient Medications on File Prior to Visit  Medication Sig Dispense Refill  . amLODipine (NORVASC) 10 MG tablet Take 1 tablet (10 mg total) by mouth at bedtime. 90 tablet 3  . aspirin 81 MG chewable tablet Chew 81 mg by mouth daily.    Marland Kitchen atorvastatin (LIPITOR) 80 MG tablet Take 1 tablet (80 mg total) by mouth daily at 6 PM. 90 tablet 3  . Budeson-Glycopyrrol-Formoterol (BREZTRI AEROSPHERE) 160-9-4.8 MCG/ACT AERO Inhale 2 puffs into the lungs in the morning and at bedtime. 5.9 g 0  . clopidogrel (PLAVIX) 75 MG tablet Take 1 tablet (75 mg total) by mouth daily. 90 tablet 3  . divalproex (DEPAKOTE) 500 MG DR tablet TAKE 1 TABLET BY MOUTH  TWICE DAILY 180 tablet 3  . ezetimibe (ZETIA) 10 MG tablet Take 1 tablet (10  mg total) by mouth daily. With lipitor 80 mg at night 90 tablet 1  . furosemide (LASIX) 20 MG tablet Take 1 tablet (20 mg total) by mouth daily as needed. Per cards 30 tablet 2  . isosorbide mononitrate (IMDUR) 60 MG 24 hr tablet Take 1 tablet (60 mg total) by mouth daily. 90 tablet 1  . mometasone-formoterol (DULERA) 100-5 MCG/ACT AERO Inhale 2 puffs into the lungs in the morning and at bedtime. Rinse mouth 1 each 0  . olmesartan-hydrochlorothiazide (BENICAR HCT) 40-25 MG tablet Take 1 tablet by mouth daily. 90 tablet 3  . pantoprazole (PROTONIX) 40 MG tablet TAKE 1 TABLET BY MOUTH  TWICE DAILY BEFORE MEALS 180 tablet 3  . Potassium Chloride ER 20 MEQ TBCR Take 1 tablet by mouth daily as needed. When taking lasix prn 9 tablet 0  . traZODone (DESYREL) 150 MG tablet Take 1 tablet (150 mg total) by mouth at  bedtime as needed for sleep. 90 tablet 3   Current Facility-Administered Medications on File Prior to Visit  Medication Dose Route Frequency Provider Last Rate Last Admin  . albuterol (PROVENTIL) (2.5 MG/3ML) 0.083% nebulizer solution 2.5 mg  2.5 mg Nebulization Once Tyler Pita, MD        Allergies  Allergen Reactions  . Ativan [Lorazepam]     Pt states it makes her tongue do weird things   . Latex Other (See Comments)    Patient stated that she was told by her doctor that she is "allergic to" this  . Tape Other (See Comments)    Patient stated that she was told by her doctor that she is "allergic to" this  . Xifaxan [Rifaximin]     Pt believes this is contributing to her abdominal pain/discomfort  . Drixoral Allergy Sinus [Dexbromphen-Pse-Apap Er] Rash    Assessment/Plan:  1. Hyperlipidemia - Direct LDL 148 which is above goal of <70.  Discussed lipid lowering options for patient.  Patient interested in Crestor. Explained that changing to Crestor would decrease her LDL but likely not get her to goal since she needs a greater than 50% reduction.    Explained benefits of PCSK9i and patient was agreeable.  Explained mechanism of action and possible side effects.  Using [employer], educated patient on storage, site selection and administration.  Patient was able to demonstrate in room.  Will complete PA and contact patient.  Continue atorvastatin 80mg  daily Continue zetia 10mg  Start Repatha 140mg  SQ Q 14d Repeat lipid panel in 2 months  Karren Cobble, PharmD, BCACP, CDCES, [place] [address], [place], [place] [address] Phone: [phone]; Fax: [phone] 10/24/2020 1:24 PM

## 2020-10-24 NOTE — Patient Instructions (Signed)
It was nice meeting you!  We would like to reduce your LDL (bad cholesterol) to be less than 70  We are going to start a new medication you will inject once every 2 weeks.  I will call you when the prior authorization is approved  We will recheck your cholesterol in about 2 months  Try to follow a heart healthy diet of vegetables, lean proteins, and whole grains  Please call with any questions!  Karren Cobble, PharmD, BCACP, [place], [place] [address] [place], [place] [address] Phone: [phone]; Fax: [phone] 10/24/2020 8:43 AM

## 2020-10-24 NOTE — Telephone Encounter (Signed)
Saw patient for appointment earlier today.  She reported her phone is not working and requests messages be sent to her myChart

## 2020-10-25 ENCOUNTER — Telehealth: Payer: Self-pay | Admitting: Internal Medicine

## 2020-10-25 ENCOUNTER — Other Ambulatory Visit: Payer: Self-pay | Admitting: Family

## 2020-10-25 DIAGNOSIS — I25118 Atherosclerotic heart disease of native coronary artery with other forms of angina pectoris: Secondary | ICD-10-CM

## 2020-10-26 ENCOUNTER — Other Ambulatory Visit: Payer: Self-pay

## 2020-10-26 DIAGNOSIS — E785 Hyperlipidemia, unspecified: Secondary | ICD-10-CM

## 2020-10-29 ENCOUNTER — Telehealth: Payer: Self-pay | Admitting: Pulmonary Disease

## 2020-10-29 DIAGNOSIS — J449 Chronic obstructive pulmonary disease, unspecified: Secondary | ICD-10-CM

## 2020-10-29 NOTE — Telephone Encounter (Signed)
It would be advisable to repeat the PFT even if it is just a simple spirometry pre and post.  It appears that she has multiple issues and had also issues during cardiac catheterization.

## 2020-10-29 NOTE — Telephone Encounter (Signed)
Patient was unable to complete PFT due to severe pain. Patient was transported to ED.  She is questioning if she needs to repeat PFT.  Dr. Patsey Berthold, please advise. thanks

## 2020-10-29 NOTE — Telephone Encounter (Addendum)
Spoke to patient and replayed below message/recommendations. Patient is willing to repeat PFT.  PFT has been ordered.  Nothing further needed at this time.

## 2020-10-30 ENCOUNTER — Other Ambulatory Visit: Payer: Self-pay | Admitting: *Deleted

## 2020-10-30 DIAGNOSIS — E785 Hyperlipidemia, unspecified: Secondary | ICD-10-CM

## 2020-10-30 MED ORDER — REPATHA SURECLICK 140 MG/ML ~~LOC~~ SOAJ: 1.0000 mL | SUBCUTANEOUS | 1 refills | Status: AC

## 2020-10-30 NOTE — Telephone Encounter (Signed)
Received a refill request for Repatha, does this go through our providers or Lipid clinic?

## 2020-11-06 ENCOUNTER — Telehealth: Payer: Medicare Other

## 2020-11-09 ENCOUNTER — Ambulatory Visit: Payer: Medicare Other | Admitting: Pulmonary Disease

## 2020-11-09 ENCOUNTER — Encounter: Payer: Self-pay | Admitting: Pulmonary Disease

## 2020-11-24 ENCOUNTER — Other Ambulatory Visit: Payer: Self-pay | Admitting: Family

## 2020-11-24 ENCOUNTER — Other Ambulatory Visit: Payer: Self-pay | Admitting: Internal Medicine

## 2020-11-24 DIAGNOSIS — I25118 Atherosclerotic heart disease of native coronary artery with other forms of angina pectoris: Secondary | ICD-10-CM

## 2020-11-24 DIAGNOSIS — E785 Hyperlipidemia, unspecified: Secondary | ICD-10-CM

## 2020-11-24 DIAGNOSIS — Z9861 Coronary angioplasty status: Secondary | ICD-10-CM

## 2020-11-24 DIAGNOSIS — J309 Allergic rhinitis, unspecified: Secondary | ICD-10-CM

## 2020-11-24 DIAGNOSIS — I2510 Atherosclerotic heart disease of native coronary artery without angina pectoris: Secondary | ICD-10-CM

## 2020-11-26 NOTE — Telephone Encounter (Signed)
Rx request sent to pharmacy.  

## 2020-11-28 ENCOUNTER — Other Ambulatory Visit: Payer: Self-pay

## 2020-11-28 ENCOUNTER — Telehealth (INDEPENDENT_AMBULATORY_CARE_PROVIDER_SITE_OTHER): Payer: Medicare Other | Admitting: Adult Health

## 2020-11-28 ENCOUNTER — Telehealth: Payer: Self-pay | Admitting: Internal Medicine

## 2020-11-28 ENCOUNTER — Encounter: Payer: Self-pay | Admitting: Adult Health

## 2020-11-28 ENCOUNTER — Ambulatory Visit: Payer: Medicare Other | Admitting: Gastroenterology

## 2020-11-28 VITALS — Ht 63.0 in | Wt 192.0 lb

## 2020-11-28 DIAGNOSIS — J209 Acute bronchitis, unspecified: Secondary | ICD-10-CM

## 2020-11-28 DIAGNOSIS — R059 Cough, unspecified: Secondary | ICD-10-CM | POA: Diagnosis not present

## 2020-11-28 DIAGNOSIS — K649 Unspecified hemorrhoids: Secondary | ICD-10-CM

## 2020-11-28 DIAGNOSIS — J440 Chronic obstructive pulmonary disease with acute lower respiratory infection: Secondary | ICD-10-CM

## 2020-11-28 MED ORDER — BENZONATATE 100 MG PO CAPS: 100.0000 mg | ORAL_CAPSULE | Freq: Two times a day (BID) | ORAL | 0 refills | Status: DC | PRN

## 2020-11-28 MED ORDER — PREDNISONE 10 MG (21) PO TBPK: ORAL_TABLET | ORAL | 0 refills | Status: DC

## 2020-11-28 MED ORDER — AMOXICILLIN-POT CLAVULANATE 875-125 MG PO TABS: 1.0000 | ORAL_TABLET | Freq: Two times a day (BID) | ORAL | 0 refills | Status: DC

## 2020-11-28 NOTE — Patient Instructions (Signed)

## 2020-11-28 NOTE — Telephone Encounter (Signed)
noted 

## 2020-11-28 NOTE — Telephone Encounter (Signed)
Patient is scheduled for a virtual at 11:30am today. She is complaining of coughing for 2 days, no other symptoms. Patient did a home covid test and it was negative.  She thinks she has bronchitis.

## 2020-11-28 NOTE — Progress Notes (Signed)
Patient was on Dr. Marius Ditch scheduled today but is a Dr. Vicente Males patient. Dr. Marius Ditch said we can cancel her appointment for today and reschedule for Dr. Vicente Males. She states if patient wants referral to general surgery for hemorrhoids removal if patient wants. Patient states that is fine go on and place referral

## 2020-11-28 NOTE — Progress Notes (Signed)
Virtual Visit via Video Note  I connected with Shawna Anderson on 11/28/20 at 11:30 AM EDT by a video enabled telemedicine application and verified that I am speaking with the correct person using two identifiers. Parties involved in visit as below:   Location: Patient: at home  Provider: Provider: Provider's office at  [facility] [place].     I discussed the limitations of evaluation and management by telemedicine and the availability of in person appointments. The patient expressed understanding and agreed to proceed.  History of Present Illness:  Patient was at a friends house the past few weeks  and she had a cold. She now has the cough. Onset 10 days ago. She reports she has a cough and a strong history of bronchitis.  Productive cough - brown sputum. She has mild shortness of breath.   96 % oxygen level and also 79 heart rate. Now. She did a covid test one day after symptoms.  Patient  denies any fever, body aches,chills, rash, chest pain, nausea, vomiting, or diarrhea  Denies dizziness, lightheadedness, pre syncopal or syncopal episodes.      Observations/Objective:  Patient is alert and oriented and responsive to questions Engages in conversation with provider. Speaks in full sentences without any pauses without any shortness of breath or distress.   Assessment and Plan: The primary encounter diagnosis was Acute bronchitis with COPD ([place]). A diagnosis of Cough was also pertinent to this visit.   Meds ordered this encounter  Medications  .     . predniSONE (STERAPRED UNI-PAK 21 TAB) 10 MG (21) TBPK tablet    Sig: PO: Take 6 tablets on day 1:Take 5 tablets day 2:Take 4 tablets day 3: Take 3 tablets day 4:Take 2 tablets day five: 5 Take 1 tablet day 6    Dispense:  21 tablet    Refill:  0  . benzonatate (TESSALON) 100 MG capsule    Sig: Take 1 capsule (100 mg total) by mouth 2 (two) times daily as needed for cough.    Dispense:  20  capsule    Refill:  0  . amoxicillin-clavulanate (AUGMENTIN) 875-125 MG tablet    Sig: Take 1 tablet by mouth every 12 (twelve) hours for 10 days.    Dispense:  20 tablet    Refill:  0   Augmentin printed first send and was destroyed and resent by E scribe.  Follow Up Instructions:   Advised in person evaluation at anytime is advised if any symptoms do not improve, worsen or change at any given time.  Red Flags discussed. The patient was given clear instructions to go to ER or return to medical center if any red flags develop, symptoms do not improve, worsen or new problems develop. They verbalized understanding. I discussed the assessment and treatment plan with the patient. The patient was provided an opportunity to ask questions and all were answered. The patient agreed with the plan and demonstrated an understanding of the instructions.   The patient was advised to call back or seek an in-person evaluation if the symptoms worsen or if the condition fails to improve as anticipated.  Return in about 1 week (around 12/05/2020), or if symptoms worsen or fail to improve, for at any time for any worsening symptoms, Go to Emergency room/ urgent care if worse.   Shawna Buffy, FNP

## 2020-11-29 ENCOUNTER — Encounter: Payer: Self-pay | Admitting: Internal Medicine

## 2020-11-29 NOTE — Telephone Encounter (Signed)
Patient last seen by you 10/05/20 seen in ED 10/18/20, Please advise

## 2020-11-30 ENCOUNTER — Telehealth: Payer: Self-pay

## 2020-11-30 NOTE — Telephone Encounter (Signed)
Patient is aware of date/time of covid test prior to PFT.  

## 2020-12-03 ENCOUNTER — Other Ambulatory Visit: Payer: Self-pay

## 2020-12-03 ENCOUNTER — Other Ambulatory Visit: Payer: Self-pay | Admitting: Internal Medicine

## 2020-12-03 ENCOUNTER — Ambulatory Visit: Payer: Medicare Other | Admitting: Surgery

## 2020-12-03 ENCOUNTER — Other Ambulatory Visit
Admission: RE | Admit: 2020-12-03 | Discharge: 2020-12-03 | Disposition: A | Discharge status: Home / Self Care | Payer: Medicare Other | Source: Ambulatory Visit | Attending: Pulmonary Disease | Admitting: Pulmonary Disease

## 2020-12-03 DIAGNOSIS — Z01812 Encounter for preprocedural laboratory examination: Secondary | ICD-10-CM | POA: Diagnosis not present

## 2020-12-03 DIAGNOSIS — Z20822 Contact with and (suspected) exposure to covid-19: Secondary | ICD-10-CM | POA: Insufficient documentation

## 2020-12-03 DIAGNOSIS — M62838 Other muscle spasm: Secondary | ICD-10-CM

## 2020-12-03 LAB — SARS CORONAVIRUS 2 (TAT 6-24 HRS): SARS Coronavirus 2: NEGATIVE

## 2020-12-03 MED ORDER — METHOCARBAMOL 500 MG PO TABS: 500.0000 mg | ORAL_TABLET | Freq: Two times a day (BID) | ORAL | 5 refills | Status: DC | PRN

## 2020-12-03 NOTE — Telephone Encounter (Signed)
Scheduled PT for 5/25 at 9:00 am for a Virtual office visit for bronchitis.

## 2020-12-03 NOTE — Telephone Encounter (Signed)
PT called in today to see if it is possible to get a refill of the Methocarbamol that she was prescribed in the ED. PT also wanted to see if they could be prescribed a zpak as well due to the meds she was prescribed last week to help is eating her up instead.

## 2020-12-04 ENCOUNTER — Ambulatory Visit: Payer: Medicare Other

## 2020-12-05 ENCOUNTER — Other Ambulatory Visit: Payer: Self-pay

## 2020-12-05 ENCOUNTER — Telehealth (INDEPENDENT_AMBULATORY_CARE_PROVIDER_SITE_OTHER): Payer: Medicare Other | Admitting: Internal Medicine

## 2020-12-05 ENCOUNTER — Encounter: Payer: Self-pay | Admitting: Internal Medicine

## 2020-12-05 VITALS — BP 105/73 | HR 78 | Ht 63.0 in | Wt 192.0 lb

## 2020-12-05 DIAGNOSIS — J42 Unspecified chronic bronchitis: Secondary | ICD-10-CM

## 2020-12-05 DIAGNOSIS — J9801 Acute bronchospasm: Secondary | ICD-10-CM | POA: Diagnosis not present

## 2020-12-05 DIAGNOSIS — J209 Acute bronchitis, unspecified: Secondary | ICD-10-CM

## 2020-12-05 DIAGNOSIS — G894 Chronic pain syndrome: Secondary | ICD-10-CM | POA: Diagnosis not present

## 2020-12-05 MED ORDER — PREDNISONE 20 MG PO TABS: 40.0000 mg | ORAL_TABLET | Freq: Every day | ORAL | 0 refills | Status: DC

## 2020-12-05 MED ORDER — DM-GUAIFENESIN ER 60-1200 MG PO TB12: 1.0000 | ORAL_TABLET | Freq: Two times a day (BID) | ORAL | 0 refills | Status: DC

## 2020-12-05 MED ORDER — ALBUTEROL SULFATE HFA 108 (90 BASE) MCG/ACT IN AERS: 1.0000 | INHALATION_SPRAY | Freq: Four times a day (QID) | RESPIRATORY_TRACT | 3 refills | Status: DC | PRN

## 2020-12-05 MED ORDER — GUAIFENESIN-CODEINE 100-10 MG/5ML PO SYRP: 5.0000 mL | ORAL_SOLUTION | Freq: Every evening | ORAL | 0 refills | Status: DC | PRN

## 2020-12-05 MED ORDER — BREZTRI AEROSPHERE 160-9-4.8 MCG/ACT IN AERO: 2.0000 | INHALATION_SPRAY | Freq: Two times a day (BID) | RESPIRATORY_TRACT | 3 refills | Status: DC

## 2020-12-05 MED ORDER — AZITHROMYCIN 250 MG PO TABS: ORAL_TABLET | ORAL | 0 refills | Status: AC

## 2020-12-05 NOTE — Progress Notes (Signed)
Telephone Note  I connected with [facility]  on 12/05/20 at  9:30 AM EDT by telephone and verified that I am speaking with the correct person using two identifiers.  Location patient: home, [place] Location provider:work or home office Persons participating in the virtual visit: patient, provider  I discussed the limitations of evaluation and management by telemedicine and the availability of in person appointments. The patient expressed understanding and agreed to proceed.   HPI:  Acute telemedicine visit for : 1. F/u pt had video visit 11/28/20 for bronchitis given tessalon perles, augmentin which caused upset stomach and is not helping. She had sick contact with a 63 y.o boy she keeps with runny nose but neg 12/03/20 covid 19 test. She has cough and sob wheezing she has used albuterol, bretzi but did not finish augmentin due to abdominal upset She is smoking cigs and h/o thc abuse and 05/2020 cxr +bronchitis   2. Chronic pain in b/l feet, back, abdomen starp stabbing at times 8/10 tylenol did not help and tried motrin   -COVID-19 vaccine status: 3/3  ROS: See pertinent positives and negatives per HPI.  Past Medical History:  Diagnosis Date  . Anxiety   . Asthma   . Bipolar 1 disorder ([place])   . Bipolar disorder ([place])   . CAD (coronary artery disease)    s/p stent BMS OM Cx  . Cervical herniated disc 04/12/2016  . COPD (chronic obstructive pulmonary disease) ([place])   . Depression   . Diabetes mellitus without complication ([place])   . Diverticulitis   . GERD (gastroesophageal reflux disease)   . Glaucoma   . History of blood transfusion   . Hyperlipidemia   . Hypertension   . OSA (obstructive sleep apnea)    not using cpap   . Plantar fasciitis    b/l feet s/p steroid shots w/o help and left surgery w/o help   . UTI (urinary tract infection)     Past Surgical History:  Procedure Laterality Date  . ABDOMINAL HYSTERECTOMY    . ABDOMINAL SURGERY  1995   Bowel resection.   Marland Kitchen CARDIAC CATHETERIZATION N/A 04/14/2016   Procedure: Left Heart Cath and Coronary Angiography;  Surgeon: Burnell Blanks, MD;  Location: [facility];  Service: Cardiovascular;  Laterality: N/A;  . CARDIAC SURGERY    . COLONOSCOPY WITH PROPOFOL N/A 07/11/2019   Procedure: COLONOSCOPY WITH PROPOFOL;  Surgeon: Jonathon Bellows, MD;  Location[facility] ENDOSCOPY;  Service: Gastroenterology;  Laterality: N/A;  . COLONOSCOPY WITH PROPOFOL N/A 07/29/2019   Procedure: COLONOSCOPY WITH PROPOFOL;  Surgeon: Jonathon Bellows, MD;  Location: [facility] ENDOSCOPY;  Service: Gastroenterology;  Laterality: N/A;  . CORONARY ANGIOPLASTY WITH STENT PLACEMENT  2010   Drug eluting stent  . ESOPHAGOGASTRODUODENOSCOPY (EGD) WITH PROPOFOL N/A 07/11/2019   Procedure: ESOPHAGOGASTRODUODENOSCOPY (EGD) WITH PROPOFOL;  Surgeon: Jonathon Bellows, MD;  Location: [facility] ENDOSCOPY;  Service: Gastroenterology;  Laterality: N/A;  . LEFT HEART CATH AND CORONARY ANGIOGRAPHY Left 09/11/2020   Procedure: LEFT HEART CATH AND CORONARY ANGIOGRAPHY;  Surgeon: Nelva Bush, MD;  Location: [place] [facility];  Service: Cardiovascular;  Laterality: Left;  . OVARIAN CYST REMOVAL       Current Outpatient Medications:  .  albuterol (VENTOLIN HFA) 108 (90 Base) MCG/ACT inhaler, Inhale 1-2 puffs into the lungs every 6 (six) hours as needed for wheezing or shortness of breath., Disp: 54 g, Rfl: 3 .  amLODipine (NORVASC) 10 MG tablet, Take 1 tablet (10 mg total) by mouth at bedtime.,  Disp: 90 tablet, Rfl: 3 .  aspirin 81 MG chewable tablet, Chew 81 mg by mouth daily., Disp: , Rfl:  .  atorvastatin (LIPITOR) 80 MG tablet, Take 1 tablet (80 mg total) by mouth daily at 6 PM., Disp: 90 tablet, Rfl: 3 .  azithromycin (ZITHROMAX) 250 MG tablet, Take 2 tablets on day 1, then 1 tablet daily on days 2 through 5 with food, Disp: 6 tablet, Rfl: 0 .  clopidogrel (PLAVIX) 75 MG tablet, Take 1 tablet (75 mg total) by mouth daily., Disp: 90 tablet, Rfl: 3 .   Dextromethorphan-Guaifenesin 60-1200 MG 12hr tablet, Take 1 tablet by mouth every 12 (twelve) hours., Disp: 60 tablet, Rfl: 0 .  divalproex (DEPAKOTE) 500 MG DR tablet, TAKE 1 TABLET BY MOUTH  TWICE DAILY, Disp: 180 tablet, Rfl: 3 .  Evolocumab (REPATHA SURECLICK) 619 MG/ML SOAJ, Inject 1 mL into the skin every 14 (fourteen) days., Disp: 6 mL, Rfl: 1 .  ezetimibe (ZETIA) 10 MG tablet, TAKE 1 TABLET BY MOUTH  DAILY WITH LIPITOR 80 MG AT NIGHT, Disp: 90 tablet, Rfl: 3 .  guaiFENesin-codeine (ROBITUSSIN AC) 100-10 MG/5ML syrup, Take 5 mLs by mouth at bedtime as needed for cough., Disp: 120 mL, Rfl: 0 .  isosorbide mononitrate (IMDUR) 60 MG 24 hr tablet, Take 1 tablet (60 mg total) by mouth daily., Disp: 90 tablet, Rfl: 1 .  methocarbamol (ROBAXIN) 500 MG tablet, Take 1 tablet (500 mg total) by mouth 2 (two) times daily as needed for muscle spasms., Disp: 60 tablet, Rfl: 5 .  nitroGLYCERIN (NITROSTAT) 0.4 MG SL tablet, DISSOLVE 1 TABLET UNDER  TONGUE EVERY 5 MINUTES AS  NEEDED FOR CHEST PAIN MAX  OF 3 TABLETS IN 15 MINUTES  . CALL 911 AFTER THIRD DOSE, Disp: 75 tablet, Rfl: 4 .  olmesartan-hydrochlorothiazide (BENICAR HCT) 40-25 MG tablet, Take 1 tablet by mouth daily., Disp: 90 tablet, Rfl: 3 .  pantoprazole (PROTONIX) 40 MG tablet, TAKE 1 TABLET BY MOUTH  TWICE DAILY BEFORE MEALS, Disp: 180 tablet, Rfl: 3 .  predniSONE (DELTASONE) 20 MG tablet, Take 2 tablets (40 mg total) by mouth daily with breakfast. In am x 5 days, Disp: 10 tablet, Rfl: 0 .  traZODone (DESYREL) 150 MG tablet, Take 1 tablet (150 mg total) by mouth at bedtime as needed for sleep., Disp: 90 tablet, Rfl: 3 .  Budeson-Glycopyrrol-Formoterol (BREZTRI AEROSPHERE) 160-9-4.8 MCG/ACT AERO, Inhale 2 puffs into the lungs in the morning and at bedtime. Rinse, Disp: 32.1 g, Rfl: 3 .  furosemide (LASIX) 20 MG tablet, Take 1 tablet (20 mg total) by mouth daily as needed. Per cards (Patient not taking: Reported on 12/05/2020), Disp: 30 tablet, Rfl:  2 .  Potassium Chloride ER 20 MEQ TBCR, Take 1 tablet by mouth daily as needed. When taking lasix prn (Patient not taking: Reported on 12/05/2020), Disp: 9 tablet, Rfl: 0 No current facility-administered medications for this visit.  Facility-Administered Medications Ordered in Other Visits:  .  albuterol (PROVENTIL) (2.5 MG/3ML) 0.083% nebulizer solution 2.5 mg, 2.5 mg, Nebulization, Once, Tyler Pita, MD  EXAM:  VITALS per patient if applicable:  GENERAL: alert, oriented, appears well and in no acute distress  HEENT: atraumatic, conjunttiva clear, no obvious abnormalities on inspection of external nose and ears  NECK: normal movements of the head and neck  LUNGS: on inspection no signs of respiratory distress, breathing rate appears normal, no obvious gross SOB, gasping or wheezing +cough on exam   CV: no obvious cyanosis  MS: moves  all visible extremities without noticeable abnormality  PSYCH/NEURO: pleasant and cooperative, no obvious depression or anxiety, speech and thought processing grossly intact  ASSESSMENT AND PLAN:  Discussed the following assessment and plan:  Chronic bronchitis with acute exacerbation (HCC) - Plan: azithromycin (ZITHROMAX) 250 MG tablet, predniSONE (DELTASONE) 40 MG tablet qd x 5 days had been on prednisone pack but still coughing, Dextromethorphan-Guaifenesin 60-1200 MG 12hr tablet, guaiFENesin-codeine (ROBITUSSIN AC) 100-10 MG/5ML syrup, albuterol (VENTOLIN HFA) 108 (90 Base) MCG/ACT inhaler F/u leb pulm   Chronic pain syndrome feet, back, abdomen ?etiology   -will refer to pain clinic [facility] further work up and care otc meds not helping Pt established will refer back for f/u   -we discussed possible serious and likely etiologies, options for evaluation and workup, limitations of telemedicine visit vs in person visit, treatment, treatment risks and precautions. Pt prefers to treat via telemedicine empirically rather than in person at this  moment.      I discussed the assessment and treatment plan with the patient. The patient was provided an opportunity to ask questions and all were answered. The patient agreed with the plan and demonstrated an understanding of the instructions.    Time spent 20 min Delorise Jackson, MD

## 2020-12-05 NOTE — Progress Notes (Signed)
Patient still having a cough. Was given amoxicillin and this caused her wheezing and stomach issues. Blood was there when wiping after bowel movement.   Patient was also given prednisone and finished this.   Patient states she has a productive cough with greenish brown phlegm.

## 2020-12-06 ENCOUNTER — Telehealth: Payer: Self-pay

## 2020-12-06 NOTE — Telephone Encounter (Signed)
Called pt to schedule appt for chronic bronchitis.

## 2020-12-15 ENCOUNTER — Other Ambulatory Visit: Payer: Self-pay | Admitting: Internal Medicine

## 2020-12-15 ENCOUNTER — Other Ambulatory Visit: Payer: Self-pay | Admitting: Gastroenterology

## 2020-12-15 DIAGNOSIS — K219 Gastro-esophageal reflux disease without esophagitis: Secondary | ICD-10-CM

## 2020-12-15 DIAGNOSIS — R110 Nausea: Secondary | ICD-10-CM

## 2020-12-17 ENCOUNTER — Inpatient Hospital Stay
Admission: EM | Admit: 2020-12-17 | Discharge: 2020-12-19 | DRG: 392 | Disposition: A | Discharge status: Home / Self Care | Payer: Medicare Other | Attending: Internal Medicine | Admitting: Internal Medicine

## 2020-12-17 ENCOUNTER — Emergency Department: Payer: Medicare Other

## 2020-12-17 ENCOUNTER — Other Ambulatory Visit: Payer: Self-pay

## 2020-12-17 ENCOUNTER — Encounter: Payer: Self-pay | Admitting: Emergency Medicine

## 2020-12-17 DIAGNOSIS — E871 Hypo-osmolality and hyponatremia: Secondary | ICD-10-CM | POA: Diagnosis not present

## 2020-12-17 DIAGNOSIS — F419 Anxiety disorder, unspecified: Secondary | ICD-10-CM | POA: Diagnosis not present

## 2020-12-17 DIAGNOSIS — K219 Gastro-esophageal reflux disease without esophagitis: Secondary | ICD-10-CM | POA: Diagnosis present

## 2020-12-17 DIAGNOSIS — Z79899 Other long term (current) drug therapy: Secondary | ICD-10-CM

## 2020-12-17 DIAGNOSIS — Z20822 Contact with and (suspected) exposure to covid-19: Secondary | ICD-10-CM | POA: Diagnosis present

## 2020-12-17 DIAGNOSIS — Z955 Presence of coronary angioplasty implant and graft: Secondary | ICD-10-CM | POA: Diagnosis not present

## 2020-12-17 DIAGNOSIS — K5792 Diverticulitis of intestine, part unspecified, without perforation or abscess without bleeding: Secondary | ICD-10-CM | POA: Diagnosis not present

## 2020-12-17 DIAGNOSIS — K529 Noninfective gastroenteritis and colitis, unspecified: Secondary | ICD-10-CM | POA: Diagnosis not present

## 2020-12-17 DIAGNOSIS — Z91040 Latex allergy status: Secondary | ICD-10-CM

## 2020-12-17 DIAGNOSIS — Z91048 Other nonmedicinal substance allergy status: Secondary | ICD-10-CM

## 2020-12-17 DIAGNOSIS — F17210 Nicotine dependence, cigarettes, uncomplicated: Secondary | ICD-10-CM | POA: Diagnosis not present

## 2020-12-17 DIAGNOSIS — I2510 Atherosclerotic heart disease of native coronary artery without angina pectoris: Secondary | ICD-10-CM

## 2020-12-17 DIAGNOSIS — Z888 Allergy status to other drugs, medicaments and biological substances status: Secondary | ICD-10-CM

## 2020-12-17 DIAGNOSIS — Z83438 Family history of other disorder of lipoprotein metabolism and other lipidemia: Secondary | ICD-10-CM | POA: Diagnosis not present

## 2020-12-17 DIAGNOSIS — G894 Chronic pain syndrome: Secondary | ICD-10-CM | POA: Diagnosis present

## 2020-12-17 DIAGNOSIS — F319 Bipolar disorder, unspecified: Secondary | ICD-10-CM | POA: Diagnosis not present

## 2020-12-17 DIAGNOSIS — Z881 Allergy status to other antibiotic agents status: Secondary | ICD-10-CM

## 2020-12-17 DIAGNOSIS — E669 Obesity, unspecified: Secondary | ICD-10-CM | POA: Diagnosis present

## 2020-12-17 DIAGNOSIS — Z818 Family history of other mental and behavioral disorders: Secondary | ICD-10-CM

## 2020-12-17 DIAGNOSIS — F32A Depression, unspecified: Secondary | ICD-10-CM | POA: Diagnosis not present

## 2020-12-17 DIAGNOSIS — H409 Unspecified glaucoma: Secondary | ICD-10-CM | POA: Diagnosis present

## 2020-12-17 DIAGNOSIS — Z9861 Coronary angioplasty status: Secondary | ICD-10-CM | POA: Diagnosis not present

## 2020-12-17 DIAGNOSIS — Z7982 Long term (current) use of aspirin: Secondary | ICD-10-CM

## 2020-12-17 DIAGNOSIS — Z833 Family history of diabetes mellitus: Secondary | ICD-10-CM

## 2020-12-17 DIAGNOSIS — I10 Essential (primary) hypertension: Secondary | ICD-10-CM | POA: Diagnosis present

## 2020-12-17 DIAGNOSIS — E785 Hyperlipidemia, unspecified: Secondary | ICD-10-CM | POA: Diagnosis not present

## 2020-12-17 DIAGNOSIS — R1032 Left lower quadrant pain: Secondary | ICD-10-CM

## 2020-12-17 DIAGNOSIS — D649 Anemia, unspecified: Secondary | ICD-10-CM | POA: Diagnosis not present

## 2020-12-17 DIAGNOSIS — E119 Type 2 diabetes mellitus without complications: Secondary | ICD-10-CM | POA: Diagnosis present

## 2020-12-17 DIAGNOSIS — J449 Chronic obstructive pulmonary disease, unspecified: Secondary | ICD-10-CM | POA: Diagnosis present

## 2020-12-17 DIAGNOSIS — G8929 Other chronic pain: Secondary | ICD-10-CM | POA: Diagnosis present

## 2020-12-17 DIAGNOSIS — K3189 Other diseases of stomach and duodenum: Secondary | ICD-10-CM | POA: Diagnosis not present

## 2020-12-17 DIAGNOSIS — Z8249 Family history of ischemic heart disease and other diseases of the circulatory system: Secondary | ICD-10-CM

## 2020-12-17 DIAGNOSIS — Z90710 Acquired absence of both cervix and uterus: Secondary | ICD-10-CM

## 2020-12-17 DIAGNOSIS — K5732 Diverticulitis of large intestine without perforation or abscess without bleeding: Secondary | ICD-10-CM | POA: Diagnosis not present

## 2020-12-17 DIAGNOSIS — R109 Unspecified abdominal pain: Secondary | ICD-10-CM | POA: Diagnosis present

## 2020-12-17 DIAGNOSIS — K6389 Other specified diseases of intestine: Secondary | ICD-10-CM | POA: Diagnosis not present

## 2020-12-17 DIAGNOSIS — G4733 Obstructive sleep apnea (adult) (pediatric): Secondary | ICD-10-CM | POA: Diagnosis not present

## 2020-12-17 DIAGNOSIS — Z6834 Body mass index (BMI) 34.0-34.9, adult: Secondary | ICD-10-CM

## 2020-12-17 DIAGNOSIS — Z7902 Long term (current) use of antithrombotics/antiplatelets: Secondary | ICD-10-CM

## 2020-12-17 LAB — URINALYSIS, COMPLETE (UACMP) WITH MICROSCOPIC
Bilirubin Urine: NEGATIVE
Glucose, UA: NEGATIVE mg/dL
Hgb urine dipstick: NEGATIVE
Ketones, ur: NEGATIVE mg/dL
Leukocytes,Ua: NEGATIVE
Nitrite: NEGATIVE
Protein, ur: NEGATIVE mg/dL
Specific Gravity, Urine: 1.018 (ref 1.005–1.030)
pH: 7 (ref 5.0–8.0)

## 2020-12-17 LAB — COMPREHENSIVE METABOLIC PANEL
ALT: 33 U/L (ref 0–44)
AST: 18 U/L (ref 15–41)
Albumin: 3.8 g/dL (ref 3.5–5.0)
Alkaline Phosphatase: 52 U/L (ref 38–126)
Anion gap: 10 (ref 5–15)
BUN: 17 mg/dL (ref 6–20)
CO2: 25 mmol/L (ref 22–32)
Calcium: 8.9 mg/dL (ref 8.9–10.3)
Chloride: 100 mmol/L (ref 98–111)
Creatinine, Ser: 0.97 mg/dL (ref 0.44–1.00)
GFR, Estimated: >60 mL/min (ref >60)
Glucose, Bld: 120 mg/dL — ABNORMAL HIGH (ref 70–99)
Potassium: 4.4 mmol/L (ref 3.5–5.1)
Sodium: 135 mmol/L (ref 135–145)
Total Bilirubin: 0.7 mg/dL (ref 0.3–1.2)
Total Protein: 6.5 g/dL (ref 6.5–8.1)

## 2020-12-17 LAB — CBC
HCT: 35 % — ABNORMAL LOW (ref 36.0–46.0)
Hemoglobin: 11.6 g/dL — ABNORMAL LOW (ref 12.0–15.0)
MCH: 30.6 pg (ref 26.0–34.0)
MCHC: 33.1 g/dL (ref 30.0–36.0)
MCV: 92.3 fL (ref 80.0–100.0)
Platelets: 465 10*3/uL — ABNORMAL HIGH (ref 150–400)
RBC: 3.79 MIL/uL — ABNORMAL LOW (ref 3.87–5.11)
RDW: 14.7 % (ref 11.5–15.5)
WBC: 20.3 10*3/uL — ABNORMAL HIGH (ref 4.0–10.5)
nRBC: 0 % (ref 0.0–0.2)

## 2020-12-17 LAB — TROPONIN I (HIGH SENSITIVITY)
Troponin I (High Sensitivity): 8 ng/L (ref <18)
Troponin I (High Sensitivity): 6 ng/L (ref <18)

## 2020-12-17 LAB — RESP PANEL BY RT-PCR (FLU A&B, COVID) ARPGX2
Influenza A by PCR: NEGATIVE
Influenza B by PCR: NEGATIVE
SARS Coronavirus 2 by RT PCR: NEGATIVE

## 2020-12-17 LAB — LIPASE, BLOOD: Lipase: 30 U/L (ref 11–51)

## 2020-12-17 MED ORDER — HYDROMORPHONE HCL 1 MG/ML IJ SOLN
0.5000 mg | INTRAMUSCULAR | Status: DC | PRN
  Administered 2020-12-17: 0.5 mg via INTRAVENOUS
  Filled 2020-12-17: qty 1

## 2020-12-17 MED ORDER — ACETAMINOPHEN 325 MG PO TABS: 650.0000 mg | ORAL_TABLET | Freq: Four times a day (QID) | ORAL | Status: DC | PRN

## 2020-12-17 MED ORDER — PANTOPRAZOLE SODIUM 40 MG PO TBEC
40.0000 mg | DELAYED_RELEASE_TABLET | Freq: Two times a day (BID) | ORAL | Status: DC
  Administered 2020-12-18 – 2020-12-19 (×3): 40 mg via ORAL
  Filled 2020-12-17 (×3): qty 1

## 2020-12-17 MED ORDER — ACETAMINOPHEN 650 MG RE SUPP: 650.0000 mg | Freq: Four times a day (QID) | RECTAL | Status: DC | PRN

## 2020-12-17 MED ORDER — HYDROMORPHONE HCL 1 MG/ML IJ SOLN
1.0000 mg | INTRAMUSCULAR | Status: AC
  Administered 2020-12-17: 1 mg via INTRAVENOUS
  Filled 2020-12-17: qty 1

## 2020-12-17 MED ORDER — SODIUM CHLORIDE 0.9% FLUSH
3.0000 mL | Freq: Two times a day (BID) | INTRAVENOUS | Status: DC
  Administered 2020-12-18 – 2020-12-19 (×2): 3 mL via INTRAVENOUS

## 2020-12-17 MED ORDER — BUDESON-GLYCOPYRROL-FORMOTEROL 160-9-4.8 MCG/ACT IN AERO: 2.0000 | INHALATION_SPRAY | Freq: Two times a day (BID) | RESPIRATORY_TRACT | Status: DC

## 2020-12-17 MED ORDER — METRONIDAZOLE 500 MG/100ML IV SOLN
500.0000 mg | Freq: Once | INTRAVENOUS | Status: AC
  Administered 2020-12-17: 500 mg via INTRAVENOUS
  Filled 2020-12-17: qty 100

## 2020-12-17 MED ORDER — FENTANYL CITRATE (PF) 100 MCG/2ML IJ SOLN
75.0000 ug | Freq: Once | INTRAMUSCULAR | Status: AC
  Administered 2020-12-17: 75 ug via INTRAVENOUS
  Filled 2020-12-17: qty 2

## 2020-12-17 MED ORDER — ENOXAPARIN SODIUM 40 MG/0.4ML IJ SOSY
40.0000 mg | PREFILLED_SYRINGE | INTRAMUSCULAR | Status: DC
  Administered 2020-12-17 – 2020-12-18 (×2): 40 mg via SUBCUTANEOUS
  Filled 2020-12-17 (×2): qty 0.4

## 2020-12-17 MED ORDER — TRAZODONE HCL 50 MG PO TABS
150.0000 mg | ORAL_TABLET | Freq: Every evening | ORAL | Status: DC | PRN
  Administered 2020-12-17 – 2020-12-18 (×2): 150 mg via ORAL
  Filled 2020-12-17: qty 1

## 2020-12-17 MED ORDER — AMLODIPINE BESYLATE 10 MG PO TABS
10.0000 mg | ORAL_TABLET | Freq: Every day | ORAL | Status: DC
  Administered 2020-12-18: 10 mg via ORAL
  Filled 2020-12-17: qty 1
  Filled 2020-12-17: qty 2

## 2020-12-17 MED ORDER — HYDROMORPHONE HCL 1 MG/ML IJ SOLN
0.5000 mg | Freq: Once | INTRAMUSCULAR | Status: AC
  Administered 2020-12-17: 0.5 mg via INTRAVENOUS
  Filled 2020-12-17: qty 1

## 2020-12-17 MED ORDER — ASPIRIN 81 MG PO CHEW
81.0000 mg | CHEWABLE_TABLET | Freq: Every day | ORAL | Status: DC
  Administered 2020-12-18 – 2020-12-19 (×2): 81 mg via ORAL
  Filled 2020-12-17 (×2): qty 1

## 2020-12-17 MED ORDER — EZETIMIBE 10 MG PO TABS
10.0000 mg | ORAL_TABLET | Freq: Every day | ORAL | Status: DC
  Administered 2020-12-18: 10 mg via ORAL
  Filled 2020-12-17 (×3): qty 1

## 2020-12-17 MED ORDER — ALBUTEROL SULFATE (2.5 MG/3ML) 0.083% IN NEBU: 2.5000 mg | INHALATION_SOLUTION | Freq: Four times a day (QID) | RESPIRATORY_TRACT | Status: DC | PRN

## 2020-12-17 MED ORDER — SODIUM CHLORIDE 0.9 % IV SOLN
2.0000 g | INTRAVENOUS | Status: DC
  Administered 2020-12-18: 2 g via INTRAVENOUS
  Filled 2020-12-17 (×2): qty 20

## 2020-12-17 MED ORDER — ATORVASTATIN CALCIUM 20 MG PO TABS
80.0000 mg | ORAL_TABLET | Freq: Every day | ORAL | Status: DC
  Administered 2020-12-18: 80 mg via ORAL
  Filled 2020-12-17: qty 4

## 2020-12-17 MED ORDER — ISOSORBIDE MONONITRATE ER 30 MG PO TB24
60.0000 mg | ORAL_TABLET | Freq: Every day | ORAL | Status: DC
  Administered 2020-12-18 – 2020-12-19 (×2): 60 mg via ORAL
  Filled 2020-12-17: qty 2
  Filled 2020-12-17: qty 1

## 2020-12-17 MED ORDER — ONDANSETRON HCL 4 MG/2ML IJ SOLN
4.0000 mg | Freq: Once | INTRAMUSCULAR | Status: AC
  Administered 2020-12-17: 4 mg via INTRAVENOUS
  Filled 2020-12-17: qty 2

## 2020-12-17 MED ORDER — SODIUM CHLORIDE 0.9 % IV SOLN
2.0000 g | Freq: Once | INTRAVENOUS | Status: AC
  Administered 2020-12-17: 2 g via INTRAVENOUS
  Filled 2020-12-17: qty 20

## 2020-12-17 MED ORDER — POLYETHYLENE GLYCOL 3350 17 G PO PACK: 17.0000 g | PACK | Freq: Every day | ORAL | Status: DC | PRN

## 2020-12-17 MED ORDER — METRONIDAZOLE 500 MG/100ML IV SOLN
500.0000 mg | Freq: Three times a day (TID) | INTRAVENOUS | Status: DC
  Administered 2020-12-18 – 2020-12-19 (×4): 500 mg via INTRAVENOUS
  Filled 2020-12-17 (×7): qty 100

## 2020-12-17 MED ORDER — ENOXAPARIN SODIUM 40 MG/0.4ML IJ SOSY: 40.0000 mg | PREFILLED_SYRINGE | INTRAMUSCULAR | Status: DC

## 2020-12-17 MED ORDER — ONDANSETRON HCL 4 MG/2ML IJ SOLN
4.0000 mg | Freq: Once | INTRAMUSCULAR | Status: AC
  Administered 2020-12-17: 4 mg via INTRAVENOUS

## 2020-12-17 MED ORDER — DIVALPROEX SODIUM 500 MG PO DR TAB
500.0000 mg | DELAYED_RELEASE_TABLET | Freq: Two times a day (BID) | ORAL | Status: DC
  Administered 2020-12-18 – 2020-12-19 (×3): 500 mg via ORAL
  Filled 2020-12-17 (×4): qty 1

## 2020-12-17 MED ORDER — ALBUTEROL SULFATE HFA 108 (90 BASE) MCG/ACT IN AERS: 1.0000 | INHALATION_SPRAY | Freq: Four times a day (QID) | RESPIRATORY_TRACT | Status: DC | PRN

## 2020-12-17 MED ORDER — ONDANSETRON HCL 4 MG/2ML IJ SOLN
4.0000 mg | Freq: Four times a day (QID) | INTRAMUSCULAR | Status: DC | PRN
  Administered 2020-12-18 (×3): 4 mg via INTRAVENOUS
  Filled 2020-12-17 (×4): qty 2

## 2020-12-17 MED ORDER — HYDROMORPHONE HCL 1 MG/ML IJ SOLN
1.0000 mg | INTRAMUSCULAR | Status: DC | PRN
  Administered 2020-12-17 – 2020-12-19 (×5): 1 mg via INTRAVENOUS
  Filled 2020-12-17 (×5): qty 1

## 2020-12-17 MED ORDER — CLOPIDOGREL BISULFATE 75 MG PO TABS
75.0000 mg | ORAL_TABLET | Freq: Every day | ORAL | Status: DC
  Administered 2020-12-18 – 2020-12-19 (×2): 75 mg via ORAL
  Filled 2020-12-17 (×2): qty 1

## 2020-12-17 NOTE — ED Triage Notes (Signed)
Pt to ED via POV with c/o LLQ abd pain that radiates up to mid epigastric abd. Pt states has been ongoing x 2 days, pt also endorses N/V/D at this time, states has been unable to eat/sleep due to pain.

## 2020-12-17 NOTE — ED Notes (Signed)
Pt trying to obtain a urine sample

## 2020-12-17 NOTE — ED Provider Notes (Signed)
[facility] Emergency Department Provider Note  ____________________________________________   Event Date/Time   First MD Initiated Contact with Patient 12/17/20 1534     (approximate)  I have reviewed the triage vital signs and the nursing notes.   HISTORY  Chief Complaint Abdominal Pain    HPI Shawna Anderson is a 55 y.o. female with bipolar, COPD, diabetes who comes in for abdominal pain.  Patient reports abdominal pain that started 2 days ago.  She states the pain was initially more mild but then getting more severe today, constant, starts in her left lower quadrant radiates up into her upper abdomen, nothing makes it better, nothing makes it worse.  Denies any new shortness of breath, cough, dysuria.  Denies any vomiting, diarrhea.  Denies any rectal bleeding  Patient does report having a history of having part of her intestine resected secondary to perforation.          Past Medical History:  Diagnosis Date  . Anxiety   . Asthma   . Bipolar 1 disorder ([place])   . Bipolar disorder ([place])   . CAD (coronary artery disease)    s/p stent BMS OM Cx  . Cervical herniated disc 04/12/2016  . COPD (chronic obstructive pulmonary disease) ([place])   . Depression   . Diabetes mellitus without complication ([place])   . Diverticulitis   . GERD (gastroesophageal reflux disease)   . Glaucoma   . History of blood transfusion   . Hyperlipidemia   . Hypertension   . OSA (obstructive sleep apnea)    not using cpap   . Plantar fasciitis    b/l feet s/p steroid shots w/o help and left surgery w/o help   . UTI (urinary tract infection)     Patient Active Problem List   Diagnosis Date Noted  . Chronic pain syndrome 12/05/2020  . Acute bronchitis with COPD ([place]) 11/28/2020  . Cough 11/28/2020  . Accelerating angina ([place]) 09/11/2020  . Chronic nausea 03/26/2020  . SUI (stress urinary incontinence, female) 03/26/2020  . Bronchitis 03/26/2020  . Continuous  LLQ abdominal pain 03/01/2020  . URI with cough and congestion 03/01/2020  . Esophageal dysphagia 01/26/2020  . Schatzki's ring of distal esophagus 01/26/2020  . Ecchymosis 01/11/2020  . Chronic pain of both knees 01/11/2020  . Bruising 12/05/2019  . Trichomonas infection 12/05/2019  . Elevated liver enzymes 11/15/2019  . Prediabetes 11/15/2019  . STD exposure 11/15/2019  . Anemia 11/15/2019  . Chronic bursitis of right shoulder 10/25/2019  . Chest wall pain 09/05/2019  . Poor dentition 08/18/2019  . Thrombocytosis 08/18/2019  . Leukocytosis 08/18/2019  . Anal lesion 08/18/2019  . Female pelvic pain 05/26/2019  . Colitis 05/26/2019  . HLD (hyperlipidemia) 12/27/2018  . Vitamin D deficiency 12/24/2018  . Bipolar disorder ([place]) 12/30/2017  . OSA (obstructive sleep apnea) 12/30/2017  . Constipation 12/30/2017  . Coronary artery disease of native artery of native heart with stable angina pectoris ([place]) 12/30/2017  . Gastroesophageal reflux disease 12/30/2017  . Asthma 12/29/2017  . COPD (chronic obstructive pulmonary disease) ([place]) 12/29/2017  . Anxiety and depression 12/29/2017  . Insomnia 12/29/2017  . Chronic back pain 12/29/2017  . Skin lesion of back 12/29/2017  . CAD S/P CFX PCI 2010 04/15/2016  . Essential hypertension 04/15/2016  . Noncompliance with medication regimen 04/15/2016  . Chest pain with moderate risk of acute coronary syndrome 04/12/2016  . Abdominal pain 01/12/2015  . Vomiting 01/12/2015  . Nausea and vomiting 01/12/2015  Past Surgical History:  Procedure Laterality Date  . ABDOMINAL HYSTERECTOMY    . ABDOMINAL SURGERY  1995   Bowel resection.  Marland Kitchen CARDIAC CATHETERIZATION N/A 04/14/2016   Procedure: Left Heart Cath and Coronary Angiography;  Surgeon: Burnell Blanks, MD;  Location: [place] [facility];  Service: Cardiovascular;  Laterality: N/A;  . CARDIAC SURGERY    . COLONOSCOPY WITH PROPOFOL N/A 07/11/2019   Procedure: COLONOSCOPY WITH  PROPOFOL;  Surgeon: Jonathon Bellows, MD;  Location: [facility] ENDOSCOPY;  Service: Gastroenterology;  Laterality: N/A;  . COLONOSCOPY WITH PROPOFOL N/A 07/29/2019   Procedure: COLONOSCOPY WITH PROPOFOL;  Surgeon: Jonathon Bellows, MD;  Location: [facility] ENDOSCOPY;  Service: Gastroenterology;  Laterality: N/A;  . CORONARY ANGIOPLASTY WITH STENT PLACEMENT  2010   Drug eluting stent  . ESOPHAGOGASTRODUODENOSCOPY (EGD) WITH PROPOFOL N/A 07/11/2019   Procedure: ESOPHAGOGASTRODUODENOSCOPY (EGD) WITH PROPOFOL;  Surgeon: Jonathon Bellows, MD;  Location: [facility] ENDOSCOPY;  Service: Gastroenterology;  Laterality: N/A;  . LEFT HEART CATH AND CORONARY ANGIOGRAPHY Left 09/11/2020   Procedure: LEFT HEART CATH AND CORONARY ANGIOGRAPHY;  Surgeon: Nelva Bush, MD;  Location: [place] [facility] LAB;  Service: Cardiovascular;  Laterality: Left;  . OVARIAN CYST REMOVAL      Prior to Admission medications   Medication Sig Start Date End Date Taking? Authorizing Provider  albuterol (VENTOLIN HFA) 108 (90 Base) MCG/ACT inhaler Inhale 1-2 puffs into the lungs every 6 (six) hours as needed for wheezing or shortness of breath. 12/05/20   McLean-Scocuzza, Nino Glow, MD  amLODipine (NORVASC) 10 MG tablet Take 1 tablet (10 mg total) by mouth at bedtime. 04/16/20   McLean-Scocuzza, Nino Glow, MD  aspirin 81 MG chewable tablet Chew 81 mg by mouth daily.    [provider]  atorvastatin (LIPITOR) 80 MG tablet Take 1 tablet (80 mg total) by mouth daily at 6 PM. 04/16/20 07/15/20  McLean-Scocuzza, Nino Glow, MD  Budeson-Glycopyrrol-Formoterol (BREZTRI AEROSPHERE) 160-9-4.8 MCG/ACT AERO Inhale 2 puffs into the lungs in the morning and at bedtime. Rinse 12/05/20   McLean-Scocuzza, Nino Glow, MD  clopidogrel (PLAVIX) 75 MG tablet Take 1 tablet (75 mg total) by mouth daily. 04/16/20   McLean-Scocuzza, Nino Glow, MD  Dextromethorphan-Guaifenesin 60-1200 MG 12hr tablet Take 1 tablet by mouth every 12 (twelve) hours. 12/05/20   McLean-Scocuzza, Nino Glow, MD  divalproex  (DEPAKOTE) 500 MG DR tablet TAKE 1 TABLET BY MOUTH  TWICE DAILY 08/16/20   McLean-Scocuzza, Nino Glow, MD  Evolocumab (REPATHA SURECLICK) 301 MG/ML SOAJ Inject 1 mL into the skin every 14 (fourteen) days. 10/30/20 01/28/21  End, Harrell Gave, MD  ezetimibe (ZETIA) 10 MG tablet TAKE 1 TABLET BY MOUTH  DAILY WITH LIPITOR 80 MG AT NIGHT 11/26/20   McLean-Scocuzza, Nino Glow, MD  furosemide (LASIX) 20 MG tablet Take 1 tablet (20 mg total) by mouth daily as needed. Per cards Patient not taking: Reported on 12/05/2020 10/22/20 10/22/21  McLean-Scocuzza, Nino Glow, MD  guaiFENesin-codeine [facility]) 100-10 MG/5ML syrup Take 5 mLs by mouth at bedtime as needed for cough. 12/05/20   McLean-Scocuzza, Nino Glow, MD  isosorbide mononitrate (IMDUR) 60 MG 24 hr tablet Take 1 tablet (60 mg total) by mouth daily. 09/19/20 03/18/21  Loel Dubonnet, NP  methocarbamol (ROBAXIN) 500 MG tablet Take 1 tablet (500 mg total) by mouth 2 (two) times daily as needed for muscle spasms. 12/03/20   McLean-Scocuzza, Nino Glow, MD  nitroGLYCERIN (NITROSTAT) 0.4 MG SL tablet DISSOLVE 1 TABLET UNDER  TONGUE EVERY 5 MINUTES AS  NEEDED FOR CHEST  PAIN MAX  OF 3 TABLETS IN 15 MINUTES  . CALL 911 AFTER THIRD DOSE 11/26/20   Loel Dubonnet, NP  olmesartan-hydrochlorothiazide (BENICAR HCT) 40-25 MG tablet Take 1 tablet by mouth daily. 02/05/20   McLean-Scocuzza, Nino Glow, MD  pantoprazole (PROTONIX) 40 MG tablet TAKE 1 TABLET BY MOUTH  TWICE DAILY BEFORE MEALS 01/31/20   Jonathon Bellows, MD  Potassium Chloride ER 20 MEQ TBCR Take 1 tablet by mouth daily as needed. When taking lasix prn Patient not taking: Reported on 12/05/2020 10/22/20   McLean-Scocuzza, Nino Glow, MD  predniSONE (DELTASONE) 20 MG tablet Take 2 tablets (40 mg total) by mouth daily with breakfast. In am x 5 days 12/05/20   McLean-Scocuzza, Nino Glow, MD  traZODone (DESYREL) 150 MG tablet Take 1 tablet (150 mg total) by mouth at bedtime as needed for sleep. 04/16/20   McLean-Scocuzza, Nino Glow, MD     Allergies Ativan [lorazepam], Augmentin [amoxicillin-pot clavulanate], Latex, Tape, Xifaxan [rifaximin], and Drixoral allergy sinus [dexbromphen-pse-apap er]  Family History  Problem Relation Age of Onset  . CAD Mother   . Depression Mother   . Heart disease Mother   . Hyperlipidemia Mother   . Hypertension Mother   . CAD Brother   . Depression Brother   . Diabetes Brother   . Heart disease Brother   . Hyperlipidemia Brother   . Heart disease Father   . Alcohol abuse Father   . Lupus Other   . Sickle cell anemia Other     Social History Social History   Tobacco Use  . Smoking status: Current Every Day Smoker    Packs/day: 1.00    Years: 30.00    Pack years: 30.00    Types: Cigarettes    Last attempt to quit: 03/22/2020    Years since quitting: 0.7  . Smokeless tobacco: Never Used  . Tobacco comment: 0.5 PPD 10/02/2020  Vaping Use  . Vaping Use: Never used  Substance Use Topics  . Alcohol use: Not Currently    Comment: once a year  . Drug use: Yes    Frequency: 7.0 times per week    Types: Marijuana      Review of Systems Constitutional: No fever/chills Eyes: No visual changes. ENT: No sore throat. Cardiovascular: Denies chest pain. Respiratory: Denies shortness of breath. Gastrointestinal: Positive abdominal pain no nausea, no vomiting.  No diarrhea.  No constipation. Genitourinary: Negative for dysuria. Musculoskeletal: Negative for back pain. Skin: Negative for rash. Neurological: Negative for headaches, focal weakness or numbness. All other ROS negative ____________________________________________   PHYSICAL EXAM:  VITAL SIGNS: ED Triage Vitals  Enc Vitals Group     BP 12/17/20 1509 110/64     Pulse Rate 12/17/20 1509 93     Resp 12/17/20 1509 20     Temp 12/17/20 1509 99 F (37.2 C)     Temp Source 12/17/20 1509 Oral     SpO2 12/17/20 1509 98 %     Weight 12/17/20 1519 192 lb (87.1 kg)     Height 12/17/20 1519 5\' 3"  (1.6 m)     Head  Circumference --      Peak Flow --      Pain Score 12/17/20 1519 10     Pain Loc --      Pain Edu? --      Excl. in [place]? --     Constitutional: Alert and oriented.  Appears in pain Eyes: Conjunctivae are normal. EOMI. Head: Atraumatic. Nose: No congestion/rhinnorhea.  Mouth/Throat: Mucous membranes are moist.   Neck: No stridor. Trachea Midline. FROM Cardiovascular: Normal rate, regular rhythm. Grossly normal heart sounds.  Good peripheral circulation. Respiratory: Normal respiratory effort.  No retractions. Lungs CTAB. Gastrointestinal: Tender in the left lower quadrant no distention. No abdominal bruits.  Musculoskeletal: No lower extremity tenderness nor edema.  No joint effusions. Neurologic:  Normal speech and language. No gross focal neurologic deficits are appreciated.  Skin:  Skin is warm, dry and intact. No rash noted. Psychiatric: Mood and affect are normal. Speech and behavior are normal. GU: Deferred   ____________________________________________   LABS (all labs ordered are listed, but only abnormal results are displayed)  Labs Reviewed  LIPASE, BLOOD  COMPREHENSIVE METABOLIC PANEL  CBC  URINALYSIS, COMPLETE (UACMP) WITH MICROSCOPIC  TROPONIN I (HIGH SENSITIVITY)   ____________________________________________   ED ECG REPORT I, Vanessa Fox, the attending physician, personally viewed and interpreted this ECG.  Normal sinus rate of 88, no ST elevation, no T wave inversions except for aVL, normal intervals  EKG is normal sinus rate of 77, no ST elevation, T wave version aVL, normal intervals ____________________________________________  RADIOLOGY   Official radiology report(s): CT ABDOMEN PELVIS WO CONTRAST  Result Date: 12/17/2020 CLINICAL DATA:  Abdominal distension LEFT lower quadrant pain. EXAM: CT ABDOMEN AND PELVIS WITHOUT CONTRAST TECHNIQUE: Multidetector CT imaging of the abdomen and pelvis was performed following the standard protocol without IV  contrast. COMPARISON:  September 21, 2019. FINDINGS: Lower chest: Incidental imaging of the lung bases without effusion or sign of consolidative changes. Hepatobiliary: Smooth contours of the liver. No pericholecystic stranding. Pancreas: Normal contour without sign of inflammation. Spleen: Spleen normal size and contour. Adrenals/Urinary Tract: Adrenal glands are normal. Smooth renal contours. No hydronephrosis. Urinary bladder is under distended without gross abnormality. No nephrolithiasis. No perinephric stranding. Stomach/Bowel: Signs of prior partial colonic resection. Colonic diverticulosis with pericolonic stranding in the LEFT lower quadrant extending towards small-bowel loops. No sign of free air. No sign of abscess. Stomach under distended. Small bowel normal caliber. Some stranding about jejunal loops in the LEFT hemiabdomen. Appendix not visualized, no secondary sign of acute appendicitis. Vascular/Lymphatic: Calcified atheromatous plaque of the abdominal aorta. There is no gastrohepatic or hepatoduodenal ligament lymphadenopathy. No retroperitoneal or mesenteric lymphadenopathy. No pelvic sidewall lymphadenopathy. Reproductive: Post hysterectomy. Other: No free air. Mesenteric stranding and stranding along the LEFT paracolic gutter associated with inflammation of the colon. Musculoskeletal: No acute musculoskeletal process. Spinal degenerative changes. IMPRESSION: 1. Acute uncomplicated diverticulitis without abscess or free air. 2. Adjacent small bowel inflammation, presumably secondary. 3. Aortic atherosclerosis. Aortic Atherosclerosis (ICD10-I70.0) and Emphysema (ICD10-J43.9). Electronically Signed   By: Zetta Bills M.D.   On: 12/17/2020 16:31    ____________________________________________   PROCEDURES  Procedure(s) performed (including Critical Care):  Procedures   ____________________________________________   INITIAL IMPRESSION / ASSESSMENT AND PLAN / ED COURSE  LEKITA KEREKES was evaluated in Emergency Department on 12/17/2020 for the symptoms described in the history of present illness. She was evaluated in the context of the global COVID-19 pandemic, which necessitated consideration that the patient might be at risk for infection with the SARS-CoV-2 virus that causes COVID-19. Institutional protocols and algorithms that pertain to the evaluation of patients at risk for COVID-19 are in a state of rapid change based on information released by regulatory bodies including the CDC and federal and state organizations. These policies and algorithms were followed during the patient's care in the ED.    Patient is a 55 year old  who comes in with pretty severe left lower quadrant pain radiating up into the upper abdomen.  Will get CT scan to evaluate for diverticulitis, appendicitis, SBO, hernia, ovarian issues.  We will give a dose of IV fentanyl and IV Zofran to help  Labs show elevated white count and CT scan does show diverticulitis.  Patient has continued pain and does not feel that she can go home on oral antibiotics therefore will discuss with hospital team for admission.  Patient did have an episode of chest pain and her repeat EKG was normal.  Her initial cardiac marker was negative and she is getting a repeat right now.  Patient was given IV Dilaudid to help with her abdominal pain.         ____________________________________________   FINAL CLINICAL IMPRESSION(S) / ED DIAGNOSES   Final diagnoses:  Diverticulitis      MEDICATIONS GIVEN DURING THIS VISIT:  Medications  cefTRIAXone (ROCEPHIN) 2 g in sodium chloride 0.9 % 100 mL IVPB (has no administration in time range)  metroNIDAZOLE (FLAGYL) IVPB 500 mg (has no administration in time range)  fentaNYL (SUBLIMAZE) injection 75 mcg (75 mcg Intravenous Given 12/17/20 1552)  ondansetron (ZOFRAN) injection 4 mg (4 mg Intravenous Given 12/17/20 1552)  HYDROmorphone (DILAUDID) injection 0.5 mg (0.5 mg  Intravenous Given 12/17/20 1627)  HYDROmorphone (DILAUDID) injection 1 mg (1 mg Intravenous Given 12/17/20 1731)     ED Discharge Orders    None       Note:  This document was prepared using Dragon voice recognition software and may include unintentional dictation errors.   Vanessa Worthing, MD 12/17/20 Tresa Moore

## 2020-12-17 NOTE — ED Notes (Signed)
Patient transported to CT 

## 2020-12-17 NOTE — ED Notes (Signed)
Called lab regarding missing troponin level

## 2020-12-17 NOTE — H&P (Signed)
History and Physical   Shawna Anderson KGM:[id number] DOB: 27-Jun-1966 DOA: 12/17/2020  PCP: McLean-Scocuzza, Nino Glow, MD   Patient coming from: Home  Chief Complaint: Abdominal pain  HPI: Shawna Anderson is a 55 y.o. female with medical history significant of anemia, bipolar, anxiety, CAD, chronic pain, COPD, hypertension, GERD, hyperlipidemia, OSA, stress incontinence, diverticulosis presents with abdominal pain.  Patient states that she has had 2 days of progressively worsening abdominal pain which is located in her left lower quadrant with radiation to her epigastrium. She reports associated nausea, vomiting, and diarrhea. She states that she has been unable to eat and sleep secondary to the pain today.  Denies any specific aggravating or alleviating factors. She denies fevers, chills.  In mild to moderate distress due to anxiety, abdominal pain, nausea when seen.  ED Course: Vital signs in the ED significant for blood pressure in the 644I to 347Q systolic.  Lab work-up showed CMP with glucose of 120.  CBC showed leukocytosis to 20.5, hemoglobin stable at 11.6 and platelets stable at 465.  Troponin negative x2.  Lipase negative.  Urinalysis showing only few bacteria otherwise normal.  Patient started on ceftriaxone and Flagyl in the ED and also received a dose of Zofran and Dilaudid for her pain.  Review of Systems: As per HPI otherwise all other systems reviewed and are negative.  Past Medical History:  Diagnosis Date  . Anxiety   . Asthma   . Bipolar 1 disorder ([place])   . Bipolar disorder ([place])   . CAD (coronary artery disease)    s/p stent BMS OM Cx  . Cervical herniated disc 04/12/2016  . COPD (chronic obstructive pulmonary disease) ([place])   . Depression   . Diabetes mellitus without complication ([place])   . Diverticulitis   . GERD (gastroesophageal reflux disease)   . Glaucoma   . History of blood transfusion   . Hyperlipidemia   . Hypertension   . OSA (obstructive sleep  apnea)    not using cpap   . Plantar fasciitis    b/l feet s/p steroid shots w/o help and left surgery w/o help   . UTI (urinary tract infection)     Past Surgical History:  Procedure Laterality Date  . ABDOMINAL HYSTERECTOMY    . ABDOMINAL SURGERY  1995   Bowel resection.  Marland Kitchen CARDIAC CATHETERIZATION N/A 04/14/2016   Procedure: Left Heart Cath and Coronary Angiography;  Surgeon: Burnell Blanks, MD;  Location: [place] [facility];  Service: Cardiovascular;  Laterality: N/A;  . CARDIAC SURGERY    . COLONOSCOPY WITH PROPOFOL N/A 07/11/2019   Procedure: COLONOSCOPY WITH PROPOFOL;  Surgeon: Jonathon Bellows, MD;  Location: [facility] ENDOSCOPY;  Service: Gastroenterology;  Laterality: N/A;  . COLONOSCOPY WITH PROPOFOL N/A 07/29/2019   Procedure: COLONOSCOPY WITH PROPOFOL;  Surgeon: Jonathon Bellows, MD;  Location: [facility] ENDOSCOPY;  Service: Gastroenterology;  Laterality: N/A;  . CORONARY ANGIOPLASTY WITH STENT PLACEMENT  2010   Drug eluting stent  . ESOPHAGOGASTRODUODENOSCOPY (EGD) WITH PROPOFOL N/A 07/11/2019   Procedure: ESOPHAGOGASTRODUODENOSCOPY (EGD) WITH PROPOFOL;  Surgeon: Jonathon Bellows, MD;  Location: [facility] ENDOSCOPY;  Service: Gastroenterology;  Laterality: N/A;  . LEFT HEART CATH AND CORONARY ANGIOGRAPHY Left 09/11/2020   Procedure: LEFT HEART CATH AND CORONARY ANGIOGRAPHY;  Surgeon: Nelva Bush, MD;  Location: [place] [facility];  Service: Cardiovascular;  Laterality: Left;  . OVARIAN CYST REMOVAL      Social History  reports that she has been smoking cigarettes. She has a 30.00 pack-year smoking  history. She has never used smokeless tobacco. She reports previous alcohol use. She reports current drug use. Frequency: 7.00 times per week. Drug: Marijuana.  Allergies  Allergen Reactions  . Ativan [Lorazepam]     Pt states it makes her tongue do weird things   . Augmentin [Amoxicillin-Pot Clavulanate]     Upset stomach   . Latex Other (See Comments)    Patient stated that she was told  by her doctor that she is "allergic to" this  . Tape Other (See Comments)    Patient stated that she was told by her doctor that she is "allergic to" this  . Xifaxan [Rifaximin]     Pt believes this is contributing to her abdominal pain/discomfort  . Drixoral Allergy Sinus [Dexbromphen-Pse-Apap Er] Rash    Family History  Problem Relation Age of Onset  . CAD Mother   . Depression Mother   . Heart disease Mother   . Hyperlipidemia Mother   . Hypertension Mother   . CAD Brother   . Depression Brother   . Diabetes Brother   . Heart disease Brother   . Hyperlipidemia Brother   . Heart disease Father   . Alcohol abuse Father   . Lupus Other   . Sickle cell anemia Other   Reviewed on admission  Prior to Admission medications   Medication Sig Start Date End Date Taking? Authorizing Provider  albuterol (VENTOLIN HFA) 108 (90 Base) MCG/ACT inhaler Inhale 1-2 puffs into the lungs every 6 (six) hours as needed for wheezing or shortness of breath. 12/05/20   McLean-Scocuzza, Nino Glow, MD  amLODipine (NORVASC) 10 MG tablet Take 1 tablet (10 mg total) by mouth at bedtime. 04/16/20   McLean-Scocuzza, Nino Glow, MD  aspirin 81 MG chewable tablet Chew 81 mg by mouth daily.    [provider]  atorvastatin (LIPITOR) 80 MG tablet Take 1 tablet (80 mg total) by mouth daily at 6 PM. 04/16/20 07/15/20  McLean-Scocuzza, Nino Glow, MD  Budeson-Glycopyrrol-Formoterol (BREZTRI AEROSPHERE) 160-9-4.8 MCG/ACT AERO Inhale 2 puffs into the lungs in the morning and at bedtime. Rinse 12/05/20   McLean-Scocuzza, Nino Glow, MD  clopidogrel (PLAVIX) 75 MG tablet Take 1 tablet (75 mg total) by mouth daily. 04/16/20   McLean-Scocuzza, Nino Glow, MD  Dextromethorphan-Guaifenesin 60-1200 MG 12hr tablet Take 1 tablet by mouth every 12 (twelve) hours. 12/05/20   McLean-Scocuzza, Nino Glow, MD  divalproex (DEPAKOTE) 500 MG DR tablet TAKE 1 TABLET BY MOUTH  TWICE DAILY 08/16/20   McLean-Scocuzza, Nino Glow, MD  Evolocumab (REPATHA  SURECLICK) 024 MG/ML SOAJ Inject 1 mL into the skin every 14 (fourteen) days. 10/30/20 01/28/21  End, Harrell Gave, MD  ezetimibe (ZETIA) 10 MG tablet TAKE 1 TABLET BY MOUTH  DAILY WITH LIPITOR 80 MG AT NIGHT 11/26/20   McLean-Scocuzza, Nino Glow, MD  furosemide (LASIX) 20 MG tablet Take 1 tablet (20 mg total) by mouth daily as needed. Per cards Patient not taking: Reported on 12/05/2020 10/22/20 10/22/21  McLean-Scocuzza, Nino Glow, MD  guaiFENesin-codeine [facility]) 100-10 MG/5ML syrup Take 5 mLs by mouth at bedtime as needed for cough. 12/05/20   McLean-Scocuzza, Nino Glow, MD  isosorbide mononitrate (IMDUR) 60 MG 24 hr tablet Take 1 tablet (60 mg total) by mouth daily. 09/19/20 03/18/21  Loel Dubonnet, NP  methocarbamol (ROBAXIN) 500 MG tablet Take 1 tablet (500 mg total) by mouth 2 (two) times daily as needed for muscle spasms. 12/03/20   McLean-Scocuzza, Nino Glow, MD  nitroGLYCERIN (NITROSTAT) 0.4 MG  SL tablet DISSOLVE 1 TABLET UNDER  TONGUE EVERY 5 MINUTES AS  NEEDED FOR CHEST PAIN MAX  OF 3 TABLETS IN 15 MINUTES  . CALL 911 AFTER THIRD DOSE 11/26/20   Loel Dubonnet, NP  olmesartan-hydrochlorothiazide (BENICAR HCT) 40-25 MG tablet Take 1 tablet by mouth daily. 02/05/20   McLean-Scocuzza, Nino Glow, MD  pantoprazole (PROTONIX) 40 MG tablet TAKE 1 TABLET BY MOUTH  TWICE DAILY BEFORE MEALS 01/31/20   Jonathon Bellows, MD  Potassium Chloride ER 20 MEQ TBCR Take 1 tablet by mouth daily as needed. When taking lasix prn Patient not taking: Reported on 12/05/2020 10/22/20   McLean-Scocuzza, Nino Glow, MD  predniSONE (DELTASONE) 20 MG tablet Take 2 tablets (40 mg total) by mouth daily with breakfast. In am x 5 days 12/05/20   McLean-Scocuzza, Nino Glow, MD  traZODone (DESYREL) 150 MG tablet Take 1 tablet (150 mg total) by mouth at bedtime as needed for sleep. 04/16/20   McLean-Scocuzza, Nino Glow, MD    Physical Exam: Vitals:   12/17/20 1509 12/17/20 1519 12/17/20 1730 12/17/20 1907  BP: 110/64  (!) 157/146 (!) 104/54   Pulse: 93   86  Resp: 20   18  Temp: 99 F (37.2 C)     TempSrc: Oral     SpO2: 98%   100%  Weight:  87.1 kg    Height:  5\' 3"  (1.6 m)     Physical Exam Constitutional:      Appearance: Normal appearance. She is obese. She is ill-appearing.  HENT:     Head: Normocephalic and atraumatic.     Mouth/Throat:     Mouth: Mucous membranes are moist.     Pharynx: Oropharynx is clear.  Eyes:     Extraocular Movements: Extraocular movements intact.     Pupils: Pupils are equal, round, and reactive to light.  Cardiovascular:     Rate and Rhythm: Normal rate and regular rhythm.     Pulses: Normal pulses.     Heart sounds: Normal heart sounds.  Pulmonary:     Effort: Pulmonary effort is normal. No respiratory distress.     Breath sounds: Normal breath sounds.  Abdominal:     General: Bowel sounds are normal. There is no distension.     Palpations: Abdomen is soft.     Tenderness: There is abdominal tenderness.  Musculoskeletal:        General: No swelling or deformity.  Skin:    General: Skin is warm and dry.  Neurological:     General: No focal deficit present.     Mental Status: Mental status is at baseline.  Psychiatric:     Comments: Anxious when seen    Labs on Admission: I have personally reviewed following labs and imaging studies  CBC: Recent Labs  Lab 12/17/20 1545  WBC 20.3*  HGB 11.6*  HCT 35.0*  MCV 92.3  PLT 465*    Basic Metabolic Panel: Recent Labs  Lab 12/17/20 1545  NA 135  K 4.4  CL 100  CO2 25  GLUCOSE 120*  BUN 17  CREATININE 0.97  CALCIUM 8.9    GFR: Estimated Creatinine Clearance: 70.2 mL/min (by C-G formula based on SCr of 0.97 mg/dL).  Liver Function Tests: Recent Labs  Lab 12/17/20 1545  AST 18  ALT 33  ALKPHOS 52  BILITOT 0.7  PROT 6.5  ALBUMIN 3.8    Urine analysis:    Component Value Date/Time   COLORURINE YELLOW (A) 12/17/2020 1537  APPEARANCEUR HAZY (A) 12/17/2020 1537   APPEARANCEUR Turbid (A) 12/31/2017  0912   LABSPEC 1.018 12/17/2020 1537   LABSPEC 1.028 10/26/2014 1717   PHURINE 7.0 12/17/2020 1537   GLUCOSEU NEGATIVE 12/17/2020 1537   GLUCOSEU Negative 10/26/2014 1717   HGBUR NEGATIVE 12/17/2020 1537   BILIRUBINUR NEGATIVE 12/17/2020 1537   BILIRUBINUR Negative 12/31/2017 0912   BILIRUBINUR Negative 10/26/2014 1717   KETONESUR NEGATIVE 12/17/2020 1537   PROTEINUR NEGATIVE 12/17/2020 1537   NITRITE NEGATIVE 12/17/2020 1537   LEUKOCYTESUR NEGATIVE 12/17/2020 1537   LEUKOCYTESUR Negative 10/26/2014 1717    Radiological Exams on Admission: CT ABDOMEN PELVIS WO CONTRAST  Result Date: 12/17/2020 CLINICAL DATA:  Abdominal distension LEFT lower quadrant pain. EXAM: CT ABDOMEN AND PELVIS WITHOUT CONTRAST TECHNIQUE: Multidetector CT imaging of the abdomen and pelvis was performed following the standard protocol without IV contrast. COMPARISON:  September 21, 2019. FINDINGS: Lower chest: Incidental imaging of the lung bases without effusion or sign of consolidative changes. Hepatobiliary: Smooth contours of the liver. No pericholecystic stranding. Pancreas: Normal contour without sign of inflammation. Spleen: Spleen normal size and contour. Adrenals/Urinary Tract: Adrenal glands are normal. Smooth renal contours. No hydronephrosis. Urinary bladder is under distended without gross abnormality. No nephrolithiasis. No perinephric stranding. Stomach/Bowel: Signs of prior partial colonic resection. Colonic diverticulosis with pericolonic stranding in the LEFT lower quadrant extending towards small-bowel loops. No sign of free air. No sign of abscess. Stomach under distended. Small bowel normal caliber. Some stranding about jejunal loops in the LEFT hemiabdomen. Appendix not visualized, no secondary sign of acute appendicitis. Vascular/Lymphatic: Calcified atheromatous plaque of the abdominal aorta. There is no gastrohepatic or hepatoduodenal ligament lymphadenopathy. No retroperitoneal or mesenteric  lymphadenopathy. No pelvic sidewall lymphadenopathy. Reproductive: Post hysterectomy. Other: No free air. Mesenteric stranding and stranding along the LEFT paracolic gutter associated with inflammation of the colon. Musculoskeletal: No acute musculoskeletal process. Spinal degenerative changes. IMPRESSION: 1. Acute uncomplicated diverticulitis without abscess or free air. 2. Adjacent small bowel inflammation, presumably secondary. 3. Aortic atherosclerosis. Aortic Atherosclerosis (ICD10-I70.0) and Emphysema (ICD10-J43.9). Electronically Signed   By: Zetta Bills M.D.   On: 12/17/2020 16:31   EKG: Independently reviewed.  Normal sinus rhythm at 88 bpm.  Assessment/Plan Principal Problem:   Acute diverticulitis Active Problems:   Abdominal pain   CAD S/P CFX PCI 2010   Essential hypertension   COPD (chronic obstructive pulmonary disease) (HCC)   Bipolar disorder (HCC)   Anxiety and depression   Gastroesophageal reflux disease   HLD (hyperlipidemia)  Acute diverticulitis > 2 days of abdominal pain that been progressive in her left lower quadrant, leukocytosis to 20.5, diverticulitis noted on CT. > Started on ceftriaxone and Flagyl in the ED with Dilaudid for pain and Zofran. - Monitor on telemetry - Continue with ceftriaxone and Flagyl - Clear liquid diet - As needed pain control - As needed Zofran - Trend fever curve and white blood cell count   Anemia, > Hemoglobin stable at 11.6 in ED - Trend CBC  Bipolar Anxiety - Continue home Depakote, trazodone  CAD hypertension Hyperlipidemia - Some lower blood pressures in the ED we will hold her olmesartan-hydrochlorothiazide - Continue home amlodipine, atorvastatin, Zetia, Imdur, aspirin, Plavix  COPD - Continue home registry and as needed albuterol  GERD - Continue home PPI  DVT prophylaxis: Lovenox  Code Status:   Full Family Communication:  None on admission Disposition Plan:   Patient is from:  Home  Anticipated DC  to:  Home  Anticipated DC date:  1 to 2 days  Anticipated DC barriers: None  Consults called:  None  Admission status:  Observation, telemetry   Severity of Illness: The appropriate patient status for this patient is OBSERVATION. Observation status is judged to be reasonable and necessary in order to provide the required intensity of service to ensure the patient's safety. The patient's presenting symptoms, physical exam findings, and initial radiographic and laboratory data in the context of their medical condition is felt to place them at decreased risk for further clinical deterioration. Furthermore, it is anticipated that the patient will be medically stable for discharge from the hospital within 2 midnights of admission. The following factors support the patient status of observation.   " The patient's presenting symptoms include abdominal pain, nausea. " The physical exam findings include obesity, anxiety, abdominal pain. " The initial radiographic and laboratory data are WBC 20.5, hemoglobin 11.6, platelets 465, CT with evidence of diverticulitis.   Marcelyn Bruins MD [facility]  How to contact the [facility] Attending or Consulting provider [place] or covering provider during after hours [place], for this patient?   1. Check the care team in [facility] and look for a) attending/consulting [facility] provider listed and b) the [facility] team listed 2. Log into [email or web address] and use [place]'s universal password to access. If you do not have the password, please contact the hospital operator. 3. Locate the [facility] provider you are looking for under [facility] and page to a number that you can be directly reached. 4. If you still have difficulty reaching the provider, please page the [facility] (Director on Call) for the Hospitalists listed on amion for assistance.  12/17/2020, 8:32 PM

## 2020-12-18 DIAGNOSIS — Z955 Presence of coronary angioplasty implant and graft: Secondary | ICD-10-CM | POA: Diagnosis not present

## 2020-12-18 DIAGNOSIS — Z83438 Family history of other disorder of lipoprotein metabolism and other lipidemia: Secondary | ICD-10-CM | POA: Diagnosis not present

## 2020-12-18 DIAGNOSIS — Z6834 Body mass index (BMI) 34.0-34.9, adult: Secondary | ICD-10-CM | POA: Diagnosis not present

## 2020-12-18 DIAGNOSIS — F17210 Nicotine dependence, cigarettes, uncomplicated: Secondary | ICD-10-CM | POA: Diagnosis present

## 2020-12-18 DIAGNOSIS — Z8249 Family history of ischemic heart disease and other diseases of the circulatory system: Secondary | ICD-10-CM | POA: Diagnosis not present

## 2020-12-18 DIAGNOSIS — E785 Hyperlipidemia, unspecified: Secondary | ICD-10-CM | POA: Diagnosis present

## 2020-12-18 DIAGNOSIS — Z79899 Other long term (current) drug therapy: Secondary | ICD-10-CM | POA: Diagnosis not present

## 2020-12-18 DIAGNOSIS — Z90710 Acquired absence of both cervix and uterus: Secondary | ICD-10-CM | POA: Diagnosis not present

## 2020-12-18 DIAGNOSIS — G4733 Obstructive sleep apnea (adult) (pediatric): Secondary | ICD-10-CM | POA: Diagnosis present

## 2020-12-18 DIAGNOSIS — K5792 Diverticulitis of intestine, part unspecified, without perforation or abscess without bleeding: Secondary | ICD-10-CM | POA: Diagnosis present

## 2020-12-18 DIAGNOSIS — I10 Essential (primary) hypertension: Secondary | ICD-10-CM | POA: Diagnosis present

## 2020-12-18 DIAGNOSIS — F319 Bipolar disorder, unspecified: Secondary | ICD-10-CM | POA: Diagnosis present

## 2020-12-18 DIAGNOSIS — K5732 Diverticulitis of large intestine without perforation or abscess without bleeding: Secondary | ICD-10-CM | POA: Diagnosis present

## 2020-12-18 DIAGNOSIS — Z20822 Contact with and (suspected) exposure to covid-19: Secondary | ICD-10-CM | POA: Diagnosis present

## 2020-12-18 DIAGNOSIS — J449 Chronic obstructive pulmonary disease, unspecified: Secondary | ICD-10-CM | POA: Diagnosis present

## 2020-12-18 DIAGNOSIS — F419 Anxiety disorder, unspecified: Secondary | ICD-10-CM | POA: Diagnosis present

## 2020-12-18 DIAGNOSIS — D649 Anemia, unspecified: Secondary | ICD-10-CM | POA: Diagnosis present

## 2020-12-18 DIAGNOSIS — K219 Gastro-esophageal reflux disease without esophagitis: Secondary | ICD-10-CM | POA: Diagnosis present

## 2020-12-18 DIAGNOSIS — H409 Unspecified glaucoma: Secondary | ICD-10-CM | POA: Diagnosis present

## 2020-12-18 DIAGNOSIS — E871 Hypo-osmolality and hyponatremia: Secondary | ICD-10-CM | POA: Diagnosis present

## 2020-12-18 DIAGNOSIS — E669 Obesity, unspecified: Secondary | ICD-10-CM | POA: Diagnosis present

## 2020-12-18 DIAGNOSIS — E119 Type 2 diabetes mellitus without complications: Secondary | ICD-10-CM | POA: Diagnosis present

## 2020-12-18 DIAGNOSIS — I2510 Atherosclerotic heart disease of native coronary artery without angina pectoris: Secondary | ICD-10-CM | POA: Diagnosis present

## 2020-12-18 DIAGNOSIS — G894 Chronic pain syndrome: Secondary | ICD-10-CM | POA: Diagnosis present

## 2020-12-18 DIAGNOSIS — Z7982 Long term (current) use of aspirin: Secondary | ICD-10-CM | POA: Diagnosis not present

## 2020-12-18 LAB — COMPREHENSIVE METABOLIC PANEL
ALT: 28 U/L (ref 0–44)
AST: 17 U/L (ref 15–41)
Albumin: 3.5 g/dL (ref 3.5–5.0)
Alkaline Phosphatase: 49 U/L (ref 38–126)
Anion gap: 7 (ref 5–15)
BUN: 16 mg/dL (ref 6–20)
CO2: 26 mmol/L (ref 22–32)
Calcium: 8.6 mg/dL — ABNORMAL LOW (ref 8.9–10.3)
Chloride: 100 mmol/L (ref 98–111)
Creatinine, Ser: 0.92 mg/dL (ref 0.44–1.00)
GFR, Estimated: >60 mL/min (ref >60)
Glucose, Bld: 111 mg/dL — ABNORMAL HIGH (ref 70–99)
Potassium: 4 mmol/L (ref 3.5–5.1)
Sodium: 133 mmol/L — ABNORMAL LOW (ref 135–145)
Total Bilirubin: 0.8 mg/dL (ref 0.3–1.2)
Total Protein: 6.5 g/dL (ref 6.5–8.1)

## 2020-12-18 LAB — CBC
HCT: 32.9 % — ABNORMAL LOW (ref 36.0–46.0)
Hemoglobin: 10.9 g/dL — ABNORMAL LOW (ref 12.0–15.0)
MCH: 30.6 pg (ref 26.0–34.0)
MCHC: 33.1 g/dL (ref 30.0–36.0)
MCV: 92.4 fL (ref 80.0–100.0)
Platelets: 421 10*3/uL — ABNORMAL HIGH (ref 150–400)
RBC: 3.56 MIL/uL — ABNORMAL LOW (ref 3.87–5.11)
RDW: 14.7 % (ref 11.5–15.5)
WBC: 16.2 10*3/uL — ABNORMAL HIGH (ref 4.0–10.5)
nRBC: 0 % (ref 0.0–0.2)

## 2020-12-18 LAB — HIV ANTIBODY (ROUTINE TESTING W REFLEX): HIV Screen 4th Generation wRfx: NONREACTIVE

## 2020-12-18 MED ORDER — UMECLIDINIUM BROMIDE 62.5 MCG/INH IN AEPB
1.0000 | INHALATION_SPRAY | Freq: Every day | RESPIRATORY_TRACT | Status: DC
  Filled 2020-12-18: qty 7

## 2020-12-18 MED ORDER — ACETAMINOPHEN 650 MG RE SUPP: 650.0000 mg | Freq: Four times a day (QID) | RECTAL | Status: DC | PRN

## 2020-12-18 MED ORDER — BISACODYL 10 MG RE SUPP
10.0000 mg | Freq: Once | RECTAL | Status: AC
  Administered 2020-12-18: 10 mg via RECTAL
  Filled 2020-12-18: qty 1

## 2020-12-18 MED ORDER — ACETAMINOPHEN 325 MG PO TABS: 650.0000 mg | ORAL_TABLET | Freq: Four times a day (QID) | ORAL | Status: DC | PRN

## 2020-12-18 MED ORDER — SODIUM CHLORIDE 0.9 % IV BOLUS
1000.0000 mL | Freq: Once | INTRAVENOUS | Status: AC
  Administered 2020-12-18: 1000 mL via INTRAVENOUS

## 2020-12-18 MED ORDER — POLYETHYLENE GLYCOL 3350 17 G PO PACK
17.0000 g | PACK | Freq: Every day | ORAL | Status: DC
  Administered 2020-12-18 – 2020-12-19 (×2): 17 g via ORAL
  Filled 2020-12-18 (×2): qty 1

## 2020-12-18 MED ORDER — BUDESON-GLYCOPYRROL-FORMOTEROL 160-9-4.8 MCG/ACT IN AERO
2.0000 | INHALATION_SPRAY | Freq: Two times a day (BID) | RESPIRATORY_TRACT | Status: DC
  Administered 2020-12-18 – 2020-12-19 (×3): 2 via RESPIRATORY_TRACT
  Filled 2020-12-18: qty 10.7

## 2020-12-18 MED ORDER — SODIUM CHLORIDE 0.9 % IV SOLN: INTRAVENOUS | Status: DC

## 2020-12-18 MED ORDER — FLUTICASONE FUROATE-VILANTEROL 200-25 MCG/INH IN AEPB
1.0000 | INHALATION_SPRAY | Freq: Every day | RESPIRATORY_TRACT | Status: DC
  Filled 2020-12-18: qty 28

## 2020-12-18 MED ORDER — LIP MEDEX EX OINT
TOPICAL_OINTMENT | CUTANEOUS | Status: DC | PRN
  Filled 2020-12-18 (×3): qty 10

## 2020-12-18 NOTE — ED Notes (Signed)
Pt ambulatory to restroom

## 2020-12-18 NOTE — Progress Notes (Signed)
[facility]  Code Status: FULL  Shawna Anderson is a 55 y.o. female patient admitted from ED awake, alert - oriented X4 - no acute distress noted. VSS -  no c/o shortness of breath, no c/o chest pain. Cardiac tele in place. Orientation to room and floor completed. Skin, clean-dry- intact without evidence of bruising, or skin tears.  No evidence of skin break down noted on exam.  ?  Will cont to eval and treat per MD orders.  Melonie [place], RN  12/18/2020 4:44 PM

## 2020-12-18 NOTE — Progress Notes (Addendum)
PROGRESS NOTE    Shawna Anderson  OIZ:[id number] DOB: 07/27/1965 DOA: 12/17/2020 PCP: McLean-Scocuzza, Nino Glow, MD   Brief Narrative: Taken from H&P.  HPI: Shawna Anderson is a 55 y.o. female with medical history significant of anemia, bipolar, anxiety, CAD, chronic pain, COPD, hypertension, GERD, hyperlipidemia, OSA, stress incontinence, diverticulosis presents with abdominal pain.             Patient states that she has had 2 days of progressively worsening abdominal pain which is located in her left lower quadrant with radiation to her epigastrium. She reports associated nausea, vomiting.  Unable to eat anything due to pain and nausea.  No diarrhea, last bowel movement was 2 days ago.  Per patient she normally gets very hard BMs. CT abdomen concerning for diverticulitis and she was started on ceftriaxone and Flagyl.  Subjective:  Patient was seen and examined today.  Continues to have left lower quadrant pain, stating that Dilaudid is not helping much.  Last BM was 2 days ago and she has a tendency of constipation but refusing to take laxatives, eventually agrees to try Dulcolax suppository. Mild nausea but no vomiting.  Assessment & Plan:   Principal Problem:   Acute diverticulitis Active Problems:   Abdominal pain   CAD S/P CFX PCI 2010   Essential hypertension   COPD (chronic obstructive pulmonary disease) (HCC)   Bipolar disorder (HCC)   Anxiety and depression   Gastroesophageal reflux disease   HLD (hyperlipidemia)  Acute diverticulitis.  Some improvement in leukocytosis but all cell lines are decreased can be some dilutional.  Continue to have significant pain.  Constipation can aggravate pain. -Give her Dulcolax suppository -Daily bowel regimen -Continue with ceftriaxone and Flagyl -Continue with pain management. -Continue with supportive care  Bipolar/anxiety disorder. -Continue home Depakote and trazodone  CAD/hypertension/hyperlipidemia.  No chest pain.   Blood pressure within goal. Her home dose of olmesartan and HCTZ was held due to some softer blood pressure in ED. -Continue home amlodipine, Lipitor, Zetia, Imdur, aspirin and Plavix  Mild hyponatremia.  May be due to poor p.o. intake.  Patient was also on HCTZ. -Monitor sodium  COPD - Continue home registry and as needed albuterol  GERD - Continue home PPI  Objective: Vitals:   12/18/20 0700 12/18/20 0800 12/18/20 1116 12/18/20 1258  BP: 103/72 (!) 147/69 97/83 (!) 124/53  Pulse: 81  75   Resp: 12 (!) 22 19   Temp: 98 F (36.7 C) 98.1 F (36.7 C)    TempSrc: Oral Oral    SpO2: 95%  99%   Weight:      Height:        Intake/Output Summary (Last 24 hours) at 12/18/2020 1303 Last data filed at 12/18/2020 1107 Gross per 24 hour  Intake 1401.65 ml  Output --  Net 1401.65 ml   Filed Weights   12/17/20 1519  Weight: 87.1 kg    Examination:  General exam: Appears calm and comfortable  Respiratory system: Clear to auscultation. Respiratory effort normal. Cardiovascular system: S1 & S2 heard, RRR. No JVD, murmurs, rubs, gallops or clicks. Gastrointestinal system: Soft,LLQ tenderness, no guarding, nondistended, bowel sounds positive. Central nervous system: Alert and oriented. No focal neurological deficits.Symmetric 5 x 5 power. Extremities: No edema, no cyanosis, pulses intact and symmetrical. Psychiatry: Judgement and insight appear normal. Mood & affect appropriate.    DVT prophylaxis: Lovenox Code Status: Full Family Communication: Discussed with patient Disposition Plan:  Status is: Inpatient  Remains inpatient appropriate because:Inpatient  level of care appropriate due to severity of illness   Dispo: The patient is from: Home              Anticipated d/c is to: Home              Patient currently is not medically stable to d/c.   Difficult to place patient No             Level of care: Med-Surg  All the records are reviewed and case discussed with Care  Management/Social Worker. Management plans discussed with the patient, nursing and they are in agreement.  Consultants:   None  Procedures:  Antimicrobials:   Data Reviewed: I have personally reviewed following labs and imaging studies  CBC: Recent Labs  Lab 12/17/20 1545 12/18/20 0409  WBC 20.3* 16.2*  HGB 11.6* 10.9*  HCT 35.0* 32.9*  MCV 92.3 92.4  PLT 465* 706*   Basic Metabolic Panel: Recent Labs  Lab 12/17/20 1545 12/18/20 0409  NA 135 133*  K 4.4 4.0  CL 100 100  CO2 25 26  GLUCOSE 120* 111*  BUN 17 16  CREATININE 0.97 0.92  CALCIUM 8.9 8.6*   GFR: Estimated Creatinine Clearance: 74 mL/min (by C-G formula based on SCr of 0.92 mg/dL). Liver Function Tests: Recent Labs  Lab 12/17/20 1545 12/18/20 0409  AST 18 17  ALT 33 28  ALKPHOS 52 49  BILITOT 0.7 0.8  PROT 6.5 6.5  ALBUMIN 3.8 3.5   Recent Labs  Lab 12/17/20 1545  LIPASE 30   No results for input(s): AMMONIA in the last 168 hours. Coagulation Profile: No results for input(s): INR, PROTIME in the last 168 hours. Cardiac Enzymes: No results for input(s): CKTOTAL, CKMB, CKMBINDEX, TROPONINI in the last 168 hours. BNP (last 3 results) No results for input(s): PROBNP in the last 8760 hours. HbA1C: No results for input(s): HGBA1C in the last 72 hours. CBG: No results for input(s): GLUCAP in the last 168 hours. Lipid Profile: No results for input(s): CHOL, HDL, LDLCALC, TRIG, CHOLHDL, LDLDIRECT in the last 72 hours. Thyroid Function Tests: No results for input(s): TSH, T4TOTAL, FREET4, T3FREE, THYROIDAB in the last 72 hours. Anemia Panel: No results for input(s): VITAMINB12, FOLATE, FERRITIN, TIBC, IRON, RETICCTPCT in the last 72 hours. Sepsis Labs: No results for input(s): PROCALCITON, LATICACIDVEN in the last 168 hours.  Recent Results (from the past 240 hour(s))  Resp Panel by RT-PCR (Flu A&B, Covid) Nasopharyngeal Swab     Status: None   Collection Time: 12/17/20  7:01 PM    Specimen: Nasopharyngeal Swab; Nasopharyngeal(NP) swabs in vial transport medium  Result Value Ref Range Status   SARS Coronavirus 2 by RT PCR NEGATIVE NEGATIVE Final    Comment: (NOTE) SARS-CoV-2 target nucleic acids are NOT DETECTED.  The SARS-CoV-2 RNA is generally detectable in upper respiratory specimens during the acute phase of infection. The lowest concentration of SARS-CoV-2 viral copies this assay can detect is 138 copies/mL. A negative result does not preclude SARS-Cov-2 infection and should not be used as the sole basis for treatment or other patient management decisions. A negative result may occur with  improper specimen collection/handling, submission of specimen other than nasopharyngeal swab, presence of viral mutation(s) within the areas targeted by this assay, and inadequate number of viral copies(<138 copies/mL). A negative result must be combined with clinical observations, patient history, and epidemiological information. The expected result is Negative.  Fact Sheet for Patients:  EntrepreneurPulse.com.au  Fact Sheet for Healthcare Providers:  IncredibleEmployment.be  This test is no t yet approved or cleared by the [place] and  has been authorized for detection and/or diagnosis of SARS-CoV-2 by FDA under an Emergency Use Authorization (EUA). This EUA will remain  in effect (meaning this test can be used) for the duration of the COVID-19 declaration under Section 564(b)(1) of the Act, 21 U.S.C.section 360bbb-3(b)(1), unless the authorization is terminated  or revoked sooner.       Influenza A by PCR NEGATIVE NEGATIVE Final   Influenza B by PCR NEGATIVE NEGATIVE Final    Comment: (NOTE) The Xpert Xpress SARS-CoV-2/FLU/RSV plus assay is intended as an aid in the diagnosis of influenza from Nasopharyngeal swab specimens and should not be used as a sole basis for treatment. Nasal washings and aspirates are  unacceptable for Xpert Xpress SARS-CoV-2/FLU/RSV testing.  Fact Sheet for Patients: EntrepreneurPulse.com.au  Fact Sheet for Healthcare Providers: IncredibleEmployment.be  This test is not yet approved or cleared by the [place] FDA and has been authorized for detection and/or diagnosis of SARS-CoV-2 by FDA under an Emergency Use Authorization (EUA). This EUA will remain in effect (meaning this test can be used) for the duration of the COVID-19 declaration under Section 564(b)(1) of the Act, 21 U.S.C. section 360bbb-3(b)(1), unless the authorization is terminated or revoked.  Performed at [facility], [address], [place], [place] [address]      Radiology Studies: CT ABDOMEN PELVIS WO CONTRAST  Result Date: 12/17/2020 CLINICAL DATA:  Abdominal distension LEFT lower quadrant pain. EXAM: CT ABDOMEN AND PELVIS WITHOUT CONTRAST TECHNIQUE: Multidetector CT imaging of the abdomen and pelvis was performed following the standard protocol without IV contrast. COMPARISON:  September 21, 2019. FINDINGS: Lower chest: Incidental imaging of the lung bases without effusion or sign of consolidative changes. Hepatobiliary: Smooth contours of the liver. No pericholecystic stranding. Pancreas: Normal contour without sign of inflammation. Spleen: Spleen normal size and contour. Adrenals/Urinary Tract: Adrenal glands are normal. Smooth renal contours. No hydronephrosis. Urinary bladder is under distended without gross abnormality. No nephrolithiasis. No perinephric stranding. Stomach/Bowel: Signs of prior partial colonic resection. Colonic diverticulosis with pericolonic stranding in the LEFT lower quadrant extending towards small-bowel loops. No sign of free air. No sign of abscess. Stomach under distended. Small bowel normal caliber. Some stranding about jejunal loops in the LEFT hemiabdomen. Appendix not visualized, no secondary sign of acute appendicitis.  Vascular/Lymphatic: Calcified atheromatous plaque of the abdominal aorta. There is no gastrohepatic or hepatoduodenal ligament lymphadenopathy. No retroperitoneal or mesenteric lymphadenopathy. No pelvic sidewall lymphadenopathy. Reproductive: Post hysterectomy. Other: No free air. Mesenteric stranding and stranding along the LEFT paracolic gutter associated with inflammation of the colon. Musculoskeletal: No acute musculoskeletal process. Spinal degenerative changes. IMPRESSION: 1. Acute uncomplicated diverticulitis without abscess or free air. 2. Adjacent small bowel inflammation, presumably secondary. 3. Aortic atherosclerosis. Aortic Atherosclerosis (ICD10-I70.0) and Emphysema (ICD10-J43.9). Electronically Signed   By: Zetta Bills M.D.   On: 12/17/2020 16:31    Scheduled Meds: . amLODipine  10 mg Oral QHS  . aspirin  81 mg Oral Daily  . atorvastatin  80 mg Oral q1800  . Budeson-Glycopyrrol-Formoterol  2 puff Inhalation BID  . clopidogrel  75 mg Oral Daily  . divalproex  500 mg Oral BID  . enoxaparin (LOVENOX) injection  40 mg Subcutaneous Q24H  . ezetimibe  10 mg Oral Daily  . isosorbide mononitrate  60 mg Oral Daily  . pantoprazole  40 mg Oral BID AC  . polyethylene glycol  17 g  Oral Daily  . sodium chloride flush  3 mL Intravenous Q12H   Continuous Infusions: . sodium chloride Stopped (12/18/20 0733)  . cefTRIAXone (ROCEPHIN)  IV    . metronidazole Stopped (12/18/20 1059)     LOS: 0 days   Time spent: 40 minutes More than 50% of the time was spent in counseling/coordination of care  Lorella Nimrod, MD [facility]  If 7PM-7AM, please contact night-coverage [email or web address]  12/18/2020, 1:03 PM   This record has been created using Systems analyst. Errors have been sought and corrected,but may not always be located. Such creation errors do not reflect on the standard of care.

## 2020-12-18 NOTE — ED Notes (Signed)
Report received from [facility] RN. Patient care assumed. Patient/RN introduction complete. Pt awaiting hospital room availability, co abd pain rates it 7/10 at this time. Will continue to monitor.

## 2020-12-18 NOTE — ED Notes (Signed)
Dr. Sidney Ace made aware of persistent hypotension, IV bolus initiated.

## 2020-12-18 NOTE — ED Notes (Signed)
Pt co nausea, med given per MD order.

## 2020-12-19 ENCOUNTER — Encounter: Payer: Self-pay | Admitting: Internal Medicine

## 2020-12-19 ENCOUNTER — Telehealth: Payer: Self-pay | Admitting: Internal Medicine

## 2020-12-19 LAB — CBC
HCT: 32.5 % — ABNORMAL LOW (ref 36.0–46.0)
Hemoglobin: 10.8 g/dL — ABNORMAL LOW (ref 12.0–15.0)
MCH: 30.9 pg (ref 26.0–34.0)
MCHC: 33.2 g/dL (ref 30.0–36.0)
MCV: 92.9 fL (ref 80.0–100.0)
Platelets: 414 10*3/uL — ABNORMAL HIGH (ref 150–400)
RBC: 3.5 MIL/uL — ABNORMAL LOW (ref 3.87–5.11)
RDW: 14.5 % (ref 11.5–15.5)
WBC: 12.8 10*3/uL — ABNORMAL HIGH (ref 4.0–10.5)
nRBC: 0 % (ref 0.0–0.2)

## 2020-12-19 LAB — BASIC METABOLIC PANEL
Anion gap: 8 (ref 5–15)
BUN: 5 mg/dL — ABNORMAL LOW (ref 6–20)
CO2: 27 mmol/L (ref 22–32)
Calcium: 8.2 mg/dL — ABNORMAL LOW (ref 8.9–10.3)
Chloride: 102 mmol/L (ref 98–111)
Creatinine, Ser: 0.66 mg/dL (ref 0.44–1.00)
GFR, Estimated: >60 mL/min (ref >60)
Glucose, Bld: 111 mg/dL — ABNORMAL HIGH (ref 70–99)
Potassium: 3.8 mmol/L (ref 3.5–5.1)
Sodium: 137 mmol/L (ref 135–145)

## 2020-12-19 MED ORDER — CIPROFLOXACIN HCL 500 MG PO TABS: 500.0000 mg | ORAL_TABLET | Freq: Two times a day (BID) | ORAL | 0 refills | Status: AC

## 2020-12-19 MED ORDER — METRONIDAZOLE 500 MG PO TABS: 500.0000 mg | ORAL_TABLET | Freq: Three times a day (TID) | ORAL | 0 refills | Status: AC

## 2020-12-19 MED ORDER — AMLODIPINE BESYLATE 5 MG PO TABS: 5.0000 mg | ORAL_TABLET | Freq: Every day | ORAL | Status: DC

## 2020-12-19 MED ORDER — POLYETHYLENE GLYCOL 3350 17 G PO PACK: 17.0000 g | PACK | Freq: Every day | ORAL | 0 refills | Status: AC

## 2020-12-19 MED ORDER — METRONIDAZOLE 500 MG PO TABS
500.0000 mg | ORAL_TABLET | Freq: Three times a day (TID) | ORAL | Status: DC
  Filled 2020-12-19 (×2): qty 1

## 2020-12-19 MED ORDER — CIPROFLOXACIN HCL 500 MG PO TABS: 500.0000 mg | ORAL_TABLET | Freq: Two times a day (BID) | ORAL | Status: DC

## 2020-12-19 NOTE — Discharge Summary (Signed)
Shawna Anderson CHE:[id number] DOB: 1966/06/16 DOA: 12/17/2020  PCP: McLean-Scocuzza, Nino Glow, MD  Admit date: 12/17/2020 Discharge date: 12/19/2020  Admitted From: home Disposition:  home  Recommendations for Outpatient Follow-up:  1. Follow up with PCP in 1 week 2. Please obtain BMP/CBC in one week 3. Please follow up GI , Dr. Vicente Males in one week      Discharge Condition:Stable CODE STATUS:full  Diet recommendation: Heart Healthy  Brief/Interim Summary: Per NTI:RWERX E Liverman is a 55 y.o. female with medical history significant of anemia, bipolar, anxiety, CAD, chronic pain, COPD, hypertension, GERD, hyperlipidemia, OSA, stress incontinence, diverticulosis presents with abdominal pain. In ED , patient was found with leukocytosis. CT abdomen found with Acute uncomplicated diverticulitis without abscess or free air.Was stared on iv antibiotics then switched to po antibiotics to complete course as outpatient. Her white count trended down, remained afebrile. She tolerated her po intake today and denied any pain. Patient was discharged home.   Discharge Diagnoses:  Principal Problem:   Acute diverticulitis Active Problems:   Abdominal pain   CAD S/P CFX PCI 2010   Essential hypertension   COPD (chronic obstructive pulmonary disease) (HCC)   Bipolar disorder (HCC)   Anxiety and depression   Gastroesophageal reflux disease   HLD (hyperlipidemia)    Discharge Instructions  Discharge Instructions    Call MD for:  persistant nausea and vomiting   Complete by: As directed    Diet - low sodium heart healthy   Complete by: As directed    Discharge instructions   Complete by: As directed    Keep hydrated F/u with your pcp and Dr. Vicente Males GI in one week   Increase activity slowly   Complete by: As directed      Allergies as of 12/19/2020      Reactions   Ativan [lorazepam]    Pt states it makes her tongue do weird things    Augmentin [amoxicillin-pot Clavulanate]    Upset  stomach   Latex Other (See Comments)   Patient stated that she was told by her doctor that she is "allergic to" this   Tape Other (See Comments)   Patient stated that she was told by her doctor that she is "allergic to" this   Xifaxan [rifaximin]    Pt believes this is contributing to her abdominal pain/discomfort   Drixoral Allergy Sinus [dexbromphen-pse-apap Er] Rash      Medication List    STOP taking these medications   Dextromethorphan-Guaifenesin 60-1200 MG 12hr tablet   furosemide 20 MG tablet Commonly known as: Lasix   guaiFENesin-codeine 100-10 MG/5ML syrup Commonly known as: ROBITUSSIN AC   olmesartan-hydrochlorothiazide 40-25 MG tablet Commonly known as: BENICAR HCT   Potassium Chloride ER 20 MEQ Tbcr   predniSONE 20 MG tablet Commonly known as: DELTASONE     TAKE these medications   albuterol 108 (90 Base) MCG/ACT inhaler Commonly known as: VENTOLIN HFA Inhale 1-2 puffs into the lungs every 6 (six) hours as needed for wheezing or shortness of breath.   amLODipine 10 MG tablet Commonly known as: NORVASC Take 1 tablet (10 mg total) by mouth at bedtime.   aspirin 81 MG chewable tablet Chew 81 mg by mouth daily.   atorvastatin 80 MG tablet Commonly known as: LIPITOR Take 1 tablet (80 mg total) by mouth daily at 6 PM.   Breztri Aerosphere 160-9-4.8 MCG/ACT Aero Generic drug: Budeson-Glycopyrrol-Formoterol Inhale 2 puffs into the lungs in the morning and at bedtime. Rinse   ciprofloxacin  500 MG tablet Commonly known as: CIPRO Take 1 tablet (500 mg total) by mouth 2 (two) times daily for 9 days.   clopidogrel 75 MG tablet Commonly known as: PLAVIX Take 1 tablet (75 mg total) by mouth daily.   divalproex 500 MG DR tablet Commonly known as: DEPAKOTE TAKE 1 TABLET BY MOUTH  TWICE DAILY   ezetimibe 10 MG tablet Commonly known as: ZETIA TAKE 1 TABLET BY MOUTH  DAILY WITH LIPITOR 80 MG AT NIGHT   isosorbide mononitrate 60 MG 24 hr tablet Commonly  known as: IMDUR Take 1 tablet (60 mg total) by mouth daily.   methocarbamol 500 MG tablet Commonly known as: Robaxin Take 1 tablet (500 mg total) by mouth 2 (two) times daily as needed for muscle spasms.   metroNIDAZOLE 500 MG tablet Commonly known as: FLAGYL Take 1 tablet (500 mg total) by mouth every 8 (eight) hours for 9 days.   nitroGLYCERIN 0.4 MG SL tablet Commonly known as: NITROSTAT DISSOLVE 1 TABLET UNDER  TONGUE EVERY 5 MINUTES AS  NEEDED FOR CHEST PAIN MAX  OF 3 TABLETS IN 15 MINUTES  . CALL 911 AFTER THIRD DOSE   pantoprazole 40 MG tablet Commonly known as: PROTONIX TAKE 1 TABLET BY MOUTH  TWICE DAILY BEFORE MEALS   polyethylene glycol 17 g packet Commonly known as: MIRALAX / GLYCOLAX Take 17 g by mouth daily. Start taking on: December 21, 8414   Repatha SureClick 606 MG/ML Soaj Generic drug: Evolocumab Inject 1 mL into the skin every 14 (fourteen) days.   traZODone 150 MG tablet Commonly known as: DESYREL Take 1 tablet (150 mg total) by mouth at bedtime as needed for sleep.       Follow-up Information    Jonathon Bellows, MD Follow up in 1 week(s).   Specialty: Gastroenterology Contact information: [place] [address] [phone]        McLean-Scocuzza, Nino Glow, MD Follow up in 1 week(s).   Specialty: Internal Medicine Contact information: [address] [place] [address] [phone]              Allergies  Allergen Reactions  . Ativan [Lorazepam]     Pt states it makes her tongue do weird things   . Augmentin [Amoxicillin-Pot Clavulanate]     Upset stomach   . Latex Other (See Comments)    Patient stated that she was told by her doctor that she is "allergic to" this  . Tape Other (See Comments)    Patient stated that she was told by her doctor that she is "allergic to" this  . Xifaxan [Rifaximin]     Pt believes this is contributing to her abdominal pain/discomfort  . Drixoral Allergy Sinus  [Dexbromphen-Pse-Apap Er] Rash    Consultations:     Procedures/Studies: CT ABDOMEN PELVIS WO CONTRAST  Result Date: 12/17/2020 CLINICAL DATA:  Abdominal distension LEFT lower quadrant pain. EXAM: CT ABDOMEN AND PELVIS WITHOUT CONTRAST TECHNIQUE: Multidetector CT imaging of the abdomen and pelvis was performed following the standard protocol without IV contrast. COMPARISON:  September 21, 2019. FINDINGS: Lower chest: Incidental imaging of the lung bases without effusion or sign of consolidative changes. Hepatobiliary: Smooth contours of the liver. No pericholecystic stranding. Pancreas: Normal contour without sign of inflammation. Spleen: Spleen normal size and contour. Adrenals/Urinary Tract: Adrenal glands are normal. Smooth renal contours. No hydronephrosis. Urinary bladder is under distended without gross abnormality. No nephrolithiasis. No perinephric stranding. Stomach/Bowel: Signs of prior partial colonic resection.  Colonic diverticulosis with pericolonic stranding in the LEFT lower quadrant extending towards small-bowel loops. No sign of free air. No sign of abscess. Stomach under distended. Small bowel normal caliber. Some stranding about jejunal loops in the LEFT hemiabdomen. Appendix not visualized, no secondary sign of acute appendicitis. Vascular/Lymphatic: Calcified atheromatous plaque of the abdominal aorta. There is no gastrohepatic or hepatoduodenal ligament lymphadenopathy. No retroperitoneal or mesenteric lymphadenopathy. No pelvic sidewall lymphadenopathy. Reproductive: Post hysterectomy. Other: No free air. Mesenteric stranding and stranding along the LEFT paracolic gutter associated with inflammation of the colon. Musculoskeletal: No acute musculoskeletal process. Spinal degenerative changes. IMPRESSION: 1. Acute uncomplicated diverticulitis without abscess or free air. 2. Adjacent small bowel inflammation, presumably secondary. 3. Aortic atherosclerosis. Aortic Atherosclerosis  (ICD10-I70.0) and Emphysema (ICD10-J43.9). Electronically Signed   By: Zetta Bills M.D.   On: 12/17/2020 16:31       Subjective: No pain/n/v. Tolerated po intake  Discharge Exam: Vitals:   12/19/20 0427 12/19/20 0818  BP: 132/87 123/70  Pulse: 86 80  Resp: 18   Temp: 98.6 F (37 C) 98.4 F (36.9 C)  SpO2: 97% 100%   Vitals:   12/18/20 2025 12/18/20 2322 12/19/20 0427 12/19/20 0818  BP: (!) 150/75 104/60 132/87 123/70  Pulse: 87 86 86 80  Resp: 19 18 18    Temp: 98.4 F (36.9 C) 98.9 F (37.2 C) 98.6 F (37 C) 98.4 F (36.9 C)  TempSrc: Oral Oral  Oral  SpO2: 98% 96% 97% 100%  Weight:      Height:        General: Pt is alert, awake, not in acute distress Cardiovascular: RRR, S1/S2 +, no rubs, no gallops Respiratory: CTA bilaterally, no wheezing, no rhonchi Abdominal: Soft, NT, ND, bowel sounds + Extremities: no edema    The results of significant diagnostics from this hospitalization (including imaging, microbiology, ancillary and laboratory) are listed below for reference.     Microbiology: Recent Results (from the past 240 hour(s))  Resp Panel by RT-PCR (Flu A&B, Covid) Nasopharyngeal Swab     Status: None   Collection Time: 12/17/20  7:01 PM   Specimen: Nasopharyngeal Swab; Nasopharyngeal(NP) swabs in vial transport medium  Result Value Ref Range Status   SARS Coronavirus 2 by RT PCR NEGATIVE NEGATIVE Final    Comment: (NOTE) SARS-CoV-2 target nucleic acids are NOT DETECTED.  The SARS-CoV-2 RNA is generally detectable in upper respiratory specimens during the acute phase of infection. The lowest concentration of SARS-CoV-2 viral copies this assay can detect is 138 copies/mL. A negative result does not preclude SARS-Cov-2 infection and should not be used as the sole basis for treatment or other patient management decisions. A negative result may occur with  improper specimen collection/handling, submission of specimen other than nasopharyngeal swab,  presence of viral mutation(s) within the areas targeted by this assay, and inadequate number of viral copies(<138 copies/mL). A negative result must be combined with clinical observations, patient history, and epidemiological information. The expected result is Negative.  Fact Sheet for Patients:  EntrepreneurPulse.com.au  Fact Sheet for Healthcare Providers:  IncredibleEmployment.be  This test is no t yet approved or cleared by the [place] FDA and  has been authorized for detection and/or diagnosis of SARS-CoV-2 by FDA under an Emergency Use Authorization (EUA). This EUA will remain  in effect (meaning this test can be used) for the duration of the COVID-19 declaration under Section 564(b)(1) of the Act, 21 U.S.C.section 360bbb-3(b)(1), unless the authorization is terminated  or revoked sooner.  Influenza A by PCR NEGATIVE NEGATIVE Final   Influenza B by PCR NEGATIVE NEGATIVE Final    Comment: (NOTE) The Xpert Xpress SARS-CoV-2/FLU/RSV plus assay is intended as an aid in the diagnosis of influenza from Nasopharyngeal swab specimens and should not be used as a sole basis for treatment. Nasal washings and aspirates are unacceptable for Xpert Xpress SARS-CoV-2/FLU/RSV testing.  Fact Sheet for Patients: EntrepreneurPulse.com.au  Fact Sheet for Healthcare Providers: IncredibleEmployment.be  This test is not yet approved or cleared by the [place] FDA and has been authorized for detection and/or diagnosis of SARS-CoV-2 by FDA under an Emergency Use Authorization (EUA). This EUA will remain in effect (meaning this test can be used) for the duration of the COVID-19 declaration under Section 564(b)(1) of the Act, 21 U.S.C. section 360bbb-3(b)(1), unless the authorization is terminated or revoked.  Performed at [facility], [address] [place], [place] [address]       Labs: BNP (last 3 results) No results for input(s): BNP in the last 8760 hours. Basic Metabolic Panel: Recent Labs  Lab 12/17/20 1545 12/18/20 0409 12/19/20 0428  NA 135 133* 137  K 4.4 4.0 3.8  CL 100 100 102  CO2 25 26 27   GLUCOSE 120* 111* 111*  BUN 17 16 5*  CREATININE 0.97 0.92 0.66  CALCIUM 8.9 8.6* 8.2*   Liver Function Tests: Recent Labs  Lab 12/17/20 1545 12/18/20 0409  AST 18 17  ALT 33 28  ALKPHOS 52 49  BILITOT 0.7 0.8  PROT 6.5 6.5  ALBUMIN 3.8 3.5   Recent Labs  Lab 12/17/20 1545  LIPASE 30   No results for input(s): AMMONIA in the last 168 hours. CBC: Recent Labs  Lab 12/17/20 1545 12/18/20 0409 12/19/20 0428  WBC 20.3* 16.2* 12.8*  HGB 11.6* 10.9* 10.8*  HCT 35.0* 32.9* 32.5*  MCV 92.3 92.4 92.9  PLT 465* 421* 414*   Cardiac Enzymes: No results for input(s): CKTOTAL, CKMB, CKMBINDEX, TROPONINI in the last 168 hours. BNP: Invalid input(s): POCBNP CBG: No results for input(s): GLUCAP in the last 168 hours. D-Dimer No results for input(s): DDIMER in the last 72 hours. Hgb A1c No results for input(s): HGBA1C in the last 72 hours. Lipid Profile No results for input(s): CHOL, HDL, LDLCALC, TRIG, CHOLHDL, LDLDIRECT in the last 72 hours. Thyroid function studies No results for input(s): TSH, T4TOTAL, T3FREE, THYROIDAB in the last 72 hours.  Invalid input(s): FREET3 Anemia work up No results for input(s): VITAMINB12, FOLATE, FERRITIN, TIBC, IRON, RETICCTPCT in the last 72 hours. Urinalysis    Component Value Date/Time   COLORURINE YELLOW (A) 12/17/2020 1537   APPEARANCEUR HAZY (A) 12/17/2020 1537   APPEARANCEUR Turbid (A) 12/31/2017 0912   LABSPEC 1.018 12/17/2020 1537   LABSPEC 1.028 10/26/2014 1717   PHURINE 7.0 12/17/2020 1537   GLUCOSEU NEGATIVE 12/17/2020 1537   GLUCOSEU Negative 10/26/2014 1717   HGBUR NEGATIVE 12/17/2020 1537   BILIRUBINUR NEGATIVE 12/17/2020 1537   BILIRUBINUR Negative 12/31/2017 0912   BILIRUBINUR  Negative 10/26/2014 1717   KETONESUR NEGATIVE 12/17/2020 1537   PROTEINUR NEGATIVE 12/17/2020 1537   NITRITE NEGATIVE 12/17/2020 1537   LEUKOCYTESUR NEGATIVE 12/17/2020 1537   LEUKOCYTESUR Negative 10/26/2014 1717   Sepsis Labs Invalid input(s): PROCALCITONIN,  WBC,  LACTICIDVEN Microbiology Recent Results (from the past 240 hour(s))  Resp Panel by RT-PCR (Flu A&B, Covid) Nasopharyngeal Swab     Status: None   Collection Time: 12/17/20  7:01 PM   Specimen: Nasopharyngeal Swab; Nasopharyngeal(NP) swabs  in vial transport medium  Result Value Ref Range Status   SARS Coronavirus 2 by RT PCR NEGATIVE NEGATIVE Final    Comment: (NOTE) SARS-CoV-2 target nucleic acids are NOT DETECTED.  The SARS-CoV-2 RNA is generally detectable in upper respiratory specimens during the acute phase of infection. The lowest concentration of SARS-CoV-2 viral copies this assay can detect is 138 copies/mL. A negative result does not preclude SARS-Cov-2 infection and should not be used as the sole basis for treatment or other patient management decisions. A negative result may occur with  improper specimen collection/handling, submission of specimen other than nasopharyngeal swab, presence of viral mutation(s) within the areas targeted by this assay, and inadequate number of viral copies(<138 copies/mL). A negative result must be combined with clinical observations, patient history, and epidemiological information. The expected result is Negative.  Fact Sheet for Patients:  EntrepreneurPulse.com.au  Fact Sheet for Healthcare Providers:  IncredibleEmployment.be  This test is no t yet approved or cleared by the [place] FDA and  has been authorized for detection and/or diagnosis of SARS-CoV-2 by FDA under an Emergency Use Authorization (EUA). This EUA will remain  in effect (meaning this test can be used) for the duration of the COVID-19 declaration under Section  564(b)(1) of the Act, 21 U.S.C.section 360bbb-3(b)(1), unless the authorization is terminated  or revoked sooner.       Influenza A by PCR NEGATIVE NEGATIVE Final   Influenza B by PCR NEGATIVE NEGATIVE Final    Comment: (NOTE) The Xpert Xpress SARS-CoV-2/FLU/RSV plus assay is intended as an aid in the diagnosis of influenza from Nasopharyngeal swab specimens and should not be used as a sole basis for treatment. Nasal washings and aspirates are unacceptable for Xpert Xpress SARS-CoV-2/FLU/RSV testing.  Fact Sheet for Patients: EntrepreneurPulse.com.au  Fact Sheet for Healthcare Providers: IncredibleEmployment.be  This test is not yet approved or cleared by the [place] FDA and has been authorized for detection and/or diagnosis of SARS-CoV-2 by FDA under an Emergency Use Authorization (EUA). This EUA will remain in effect (meaning this test can be used) for the duration of the COVID-19 declaration under Section 564(b)(1) of the Act, 21 U.S.C. section 360bbb-3(b)(1), unless the authorization is terminated or revoked.  Performed at [facility], [address] [place], [place] [address]      Time coordinating discharge: Over 30 minutes  SIGNED:   Nolberto Hanlon, MD  [facility] 12/19/2020, 9:59 AM Pager   If 7PM-7AM, please contact night-coverage [email or web address] Password [email or web address]

## 2020-12-19 NOTE — Progress Notes (Signed)
Pt discharged home with all belongings and discharge instructions. Per pt, she wishes to wait for her transportation (family) downstairs in the lobby and wished to walk down independently. Pt escorted to elevator to assess safety - pt independently ambulates safely.

## 2020-12-19 NOTE — Plan of Care (Signed)
  Problem: Education: Goal: Knowledge of General Education information will improve Description: Including pain rating scale, medication(s)/side effects and non-pharmacologic comfort measures Outcome: Progressing   Problem: Health Behavior/Discharge Planning: Goal: Ability to manage health-related needs will improve Outcome: Progressing   Problem: Pain Managment: Goal: General experience of comfort will improve Outcome: Not Progressing

## 2020-12-19 NOTE — Telephone Encounter (Signed)
I do not round on patients in the hospital and her care is by the hospital physicians instruction not me  F/u with GI call for appt as well

## 2020-12-19 NOTE — Telephone Encounter (Signed)
Access Nurse Documentation   

## 2020-12-19 NOTE — Telephone Encounter (Signed)
Please advise 

## 2020-12-19 NOTE — Telephone Encounter (Signed)
Patient is now home, hospital discharged the Patient, with Cipro and flagyl. Patient will see Dr Vicente Males on 12/25/20

## 2020-12-19 NOTE — Progress Notes (Signed)
Mobility Specialist - Progress Note   12/19/20 1100  Mobility  Activity Ambulated in hall  Mobility performed by Mobility specialist    Pt seen ambulating in hallway independently. No AD. No LOB. Pt anticipating d/c.   Kathee Delton Mobility Specialist 12/19/20, 11:01 AM

## 2020-12-19 NOTE — Telephone Encounter (Signed)
PT called from the Hospital to request Dr.TMS to give consent for her to go home from the Hospital and to be on a diet. She can be reach at [phone] for more info.

## 2020-12-20 ENCOUNTER — Telehealth: Payer: Self-pay

## 2020-12-20 NOTE — Telephone Encounter (Signed)
Transition Care Management Unsuccessful Follow-up Telephone Call  Date of discharge and from where:  12/19/20 from [facility]  Attempts:  1st Attempt  Reason for unsuccessful TCM follow-up call:  Unable to reach patient. Will follow for scheduling per discharge note.

## 2020-12-21 NOTE — Telephone Encounter (Signed)
Transition Care Management Unsuccessful Follow-up Telephone Call  Date of discharge and from where:  12/19/20 from [facility]  Attempts:  2nd Attempt  Reason for unsuccessful TCM follow-up call:  Left voice message

## 2020-12-24 NOTE — Telephone Encounter (Signed)
Transition Care Management Unsuccessful Follow-up Telephone Call  Date of discharge and from where:  12/19/20 from [facility]  Attempts:  3rd Attempt  Reason for unsuccessful TCM follow-up call:  No answer/busy

## 2020-12-25 ENCOUNTER — Ambulatory Visit: Payer: Medicare Other | Admitting: Gastroenterology

## 2020-12-31 ENCOUNTER — Encounter: Payer: Self-pay | Admitting: Emergency Medicine

## 2020-12-31 ENCOUNTER — Emergency Department: Payer: Medicare Other

## 2020-12-31 ENCOUNTER — Other Ambulatory Visit: Payer: Medicare Other

## 2020-12-31 ENCOUNTER — Other Ambulatory Visit: Payer: Self-pay

## 2020-12-31 ENCOUNTER — Telehealth: Payer: Self-pay | Admitting: Internal Medicine

## 2020-12-31 ENCOUNTER — Inpatient Hospital Stay
Admission: EM | Admit: 2020-12-31 | Discharge: 2021-01-09 | DRG: 392 | Disposition: A | Discharge status: Home / Self Care | Payer: Medicare Other | Attending: Internal Medicine | Admitting: Internal Medicine

## 2020-12-31 DIAGNOSIS — R7401 Elevation of levels of liver transaminase levels: Secondary | ICD-10-CM | POA: Diagnosis not present

## 2020-12-31 DIAGNOSIS — K5900 Constipation, unspecified: Secondary | ICD-10-CM | POA: Diagnosis not present

## 2020-12-31 DIAGNOSIS — K3189 Other diseases of stomach and duodenum: Secondary | ICD-10-CM | POA: Diagnosis not present

## 2020-12-31 DIAGNOSIS — Z811 Family history of alcohol abuse and dependence: Secondary | ICD-10-CM

## 2020-12-31 DIAGNOSIS — Z20822 Contact with and (suspected) exposure to covid-19: Secondary | ICD-10-CM | POA: Diagnosis present

## 2020-12-31 DIAGNOSIS — Z91040 Latex allergy status: Secondary | ICD-10-CM

## 2020-12-31 DIAGNOSIS — I2510 Atherosclerotic heart disease of native coronary artery without angina pectoris: Secondary | ICD-10-CM | POA: Diagnosis not present

## 2020-12-31 DIAGNOSIS — Z7902 Long term (current) use of antithrombotics/antiplatelets: Secondary | ICD-10-CM

## 2020-12-31 DIAGNOSIS — Z955 Presence of coronary angioplasty implant and graft: Secondary | ICD-10-CM

## 2020-12-31 DIAGNOSIS — E876 Hypokalemia: Secondary | ICD-10-CM

## 2020-12-31 DIAGNOSIS — K3184 Gastroparesis: Secondary | ICD-10-CM | POA: Diagnosis not present

## 2020-12-31 DIAGNOSIS — K5732 Diverticulitis of large intestine without perforation or abscess without bleeding: Secondary | ICD-10-CM | POA: Diagnosis not present

## 2020-12-31 DIAGNOSIS — N179 Acute kidney failure, unspecified: Secondary | ICD-10-CM | POA: Diagnosis not present

## 2020-12-31 DIAGNOSIS — F319 Bipolar disorder, unspecified: Secondary | ICD-10-CM | POA: Diagnosis present

## 2020-12-31 DIAGNOSIS — E785 Hyperlipidemia, unspecified: Secondary | ICD-10-CM | POA: Diagnosis not present

## 2020-12-31 DIAGNOSIS — K219 Gastro-esophageal reflux disease without esophagitis: Secondary | ICD-10-CM | POA: Diagnosis present

## 2020-12-31 DIAGNOSIS — Z83438 Family history of other disorder of lipoprotein metabolism and other lipidemia: Secondary | ICD-10-CM

## 2020-12-31 DIAGNOSIS — G4733 Obstructive sleep apnea (adult) (pediatric): Secondary | ICD-10-CM | POA: Diagnosis not present

## 2020-12-31 DIAGNOSIS — I10 Essential (primary) hypertension: Secondary | ICD-10-CM | POA: Diagnosis present

## 2020-12-31 DIAGNOSIS — G894 Chronic pain syndrome: Secondary | ICD-10-CM | POA: Diagnosis present

## 2020-12-31 DIAGNOSIS — R112 Nausea with vomiting, unspecified: Secondary | ICD-10-CM | POA: Diagnosis not present

## 2020-12-31 DIAGNOSIS — E669 Obesity, unspecified: Secondary | ICD-10-CM | POA: Diagnosis present

## 2020-12-31 DIAGNOSIS — Z6833 Body mass index (BMI) 33.0-33.9, adult: Secondary | ICD-10-CM

## 2020-12-31 DIAGNOSIS — R109 Unspecified abdominal pain: Secondary | ICD-10-CM | POA: Diagnosis not present

## 2020-12-31 DIAGNOSIS — Z888 Allergy status to other drugs, medicaments and biological substances status: Secondary | ICD-10-CM

## 2020-12-31 DIAGNOSIS — Z9049 Acquired absence of other specified parts of digestive tract: Secondary | ICD-10-CM

## 2020-12-31 DIAGNOSIS — D72829 Elevated white blood cell count, unspecified: Secondary | ICD-10-CM | POA: Diagnosis not present

## 2020-12-31 DIAGNOSIS — R1013 Epigastric pain: Secondary | ICD-10-CM | POA: Diagnosis not present

## 2020-12-31 DIAGNOSIS — Z9861 Coronary angioplasty status: Secondary | ICD-10-CM | POA: Diagnosis not present

## 2020-12-31 DIAGNOSIS — K559 Vascular disorder of intestine, unspecified: Secondary | ICD-10-CM | POA: Diagnosis present

## 2020-12-31 DIAGNOSIS — K6389 Other specified diseases of intestine: Secondary | ICD-10-CM | POA: Diagnosis not present

## 2020-12-31 DIAGNOSIS — Z818 Family history of other mental and behavioral disorders: Secondary | ICD-10-CM

## 2020-12-31 DIAGNOSIS — F17210 Nicotine dependence, cigarettes, uncomplicated: Secondary | ICD-10-CM | POA: Diagnosis not present

## 2020-12-31 DIAGNOSIS — F419 Anxiety disorder, unspecified: Secondary | ICD-10-CM | POA: Diagnosis present

## 2020-12-31 DIAGNOSIS — E1143 Type 2 diabetes mellitus with diabetic autonomic (poly)neuropathy: Secondary | ICD-10-CM | POA: Diagnosis present

## 2020-12-31 DIAGNOSIS — I700 Atherosclerosis of aorta: Secondary | ICD-10-CM | POA: Diagnosis not present

## 2020-12-31 DIAGNOSIS — K222 Esophageal obstruction: Secondary | ICD-10-CM | POA: Diagnosis present

## 2020-12-31 DIAGNOSIS — Z79899 Other long term (current) drug therapy: Secondary | ICD-10-CM

## 2020-12-31 DIAGNOSIS — Z832 Family history of diseases of the blood and blood-forming organs and certain disorders involving the immune mechanism: Secondary | ICD-10-CM

## 2020-12-31 DIAGNOSIS — Z7982 Long term (current) use of aspirin: Secondary | ICD-10-CM

## 2020-12-31 DIAGNOSIS — Z8719 Personal history of other diseases of the digestive system: Secondary | ICD-10-CM

## 2020-12-31 DIAGNOSIS — Z90710 Acquired absence of both cervix and uterus: Secondary | ICD-10-CM

## 2020-12-31 DIAGNOSIS — Z8249 Family history of ischemic heart disease and other diseases of the circulatory system: Secondary | ICD-10-CM

## 2020-12-31 DIAGNOSIS — K529 Noninfective gastroenteritis and colitis, unspecified: Secondary | ICD-10-CM | POA: Diagnosis not present

## 2020-12-31 DIAGNOSIS — F1210 Cannabis abuse, uncomplicated: Secondary | ICD-10-CM | POA: Diagnosis present

## 2020-12-31 DIAGNOSIS — Z833 Family history of diabetes mellitus: Secondary | ICD-10-CM

## 2020-12-31 DIAGNOSIS — K5792 Diverticulitis of intestine, part unspecified, without perforation or abscess without bleeding: Secondary | ICD-10-CM | POA: Diagnosis present

## 2020-12-31 DIAGNOSIS — J449 Chronic obstructive pulmonary disease, unspecified: Secondary | ICD-10-CM | POA: Diagnosis not present

## 2020-12-31 DIAGNOSIS — R1032 Left lower quadrant pain: Secondary | ICD-10-CM | POA: Diagnosis not present

## 2020-12-31 DIAGNOSIS — R140 Abdominal distension (gaseous): Secondary | ICD-10-CM

## 2020-12-31 DIAGNOSIS — K921 Melena: Secondary | ICD-10-CM | POA: Diagnosis not present

## 2020-12-31 DIAGNOSIS — Z9889 Other specified postprocedural states: Secondary | ICD-10-CM | POA: Diagnosis not present

## 2020-12-31 LAB — RESP PANEL BY RT-PCR (FLU A&B, COVID) ARPGX2
Influenza A by PCR: NEGATIVE
Influenza B by PCR: NEGATIVE
SARS Coronavirus 2 by RT PCR: NEGATIVE

## 2020-12-31 LAB — LIPID PANEL
Chol/HDL Ratio: 3.8 ratio (ref 0.0–4.4)
Cholesterol, Total: 158 mg/dL (ref 100–199)
HDL: 42 mg/dL (ref >39)
LDL Chol Calc (NIH): 72 mg/dL (ref 0–99)
Triglycerides: 275 mg/dL — ABNORMAL HIGH (ref 0–149)
VLDL Cholesterol Cal: 44 mg/dL — ABNORMAL HIGH (ref 5–40)

## 2020-12-31 LAB — COMPREHENSIVE METABOLIC PANEL
ALT: 104 U/L — ABNORMAL HIGH (ref 0–44)
AST: 50 U/L — ABNORMAL HIGH (ref 15–41)
Albumin: 4.2 g/dL (ref 3.5–5.0)
Alkaline Phosphatase: 79 U/L (ref 38–126)
Anion gap: 11 (ref 5–15)
BUN: 17 mg/dL (ref 6–20)
CO2: 27 mmol/L (ref 22–32)
Calcium: 8.9 mg/dL (ref 8.9–10.3)
Chloride: 99 mmol/L (ref 98–111)
Creatinine, Ser: 1.9 mg/dL — ABNORMAL HIGH (ref 0.44–1.00)
GFR, Estimated: 31 mL/min — ABNORMAL LOW (ref >60)
Glucose, Bld: 135 mg/dL — ABNORMAL HIGH (ref 70–99)
Potassium: 2.9 mmol/L — ABNORMAL LOW (ref 3.5–5.1)
Sodium: 137 mmol/L (ref 135–145)
Total Bilirubin: 0.6 mg/dL (ref 0.3–1.2)
Total Protein: 7.4 g/dL (ref 6.5–8.1)

## 2020-12-31 LAB — CBC
HCT: 39 % (ref 36.0–46.0)
Hemoglobin: 13.6 g/dL (ref 12.0–15.0)
MCH: 30.7 pg (ref 26.0–34.0)
MCHC: 34.9 g/dL (ref 30.0–36.0)
MCV: 88 fL (ref 80.0–100.0)
Platelets: 374 10*3/uL (ref 150–400)
RBC: 4.43 MIL/uL (ref 3.87–5.11)
RDW: 13.6 % (ref 11.5–15.5)
WBC: 14.9 10*3/uL — ABNORMAL HIGH (ref 4.0–10.5)
nRBC: 0 % (ref 0.0–0.2)

## 2020-12-31 LAB — MAGNESIUM: Magnesium: 2.4 mg/dL (ref 1.7–2.4)

## 2020-12-31 LAB — LIPASE, BLOOD: Lipase: 28 U/L (ref 11–51)

## 2020-12-31 MED ORDER — HYDROMORPHONE HCL 1 MG/ML IJ SOLN
0.5000 mg | INTRAMUSCULAR | Status: DC | PRN
  Administered 2020-12-31 – 2021-01-05 (×12): 0.5 mg via INTRAVENOUS
  Filled 2020-12-31: qty 1
  Filled 2020-12-31 (×2): qty 0.5
  Filled 2020-12-31: qty 1
  Filled 2020-12-31 (×5): qty 0.5
  Filled 2020-12-31: qty 1
  Filled 2020-12-31 (×4): qty 0.5

## 2020-12-31 MED ORDER — PIPERACILLIN-TAZOBACTAM 3.375 G IVPB 30 MIN
3.3750 g | Freq: Once | INTRAVENOUS | Status: AC
  Administered 2020-12-31: 3.375 g via INTRAVENOUS

## 2020-12-31 MED ORDER — TIOTROPIUM BROMIDE MONOHYDRATE 18 MCG IN CAPS
18.0000 ug | ORAL_CAPSULE | Freq: Every day | RESPIRATORY_TRACT | Status: DC
  Administered 2020-12-31 – 2021-01-09 (×9): 18 ug via RESPIRATORY_TRACT
  Filled 2020-12-31 (×3): qty 5

## 2020-12-31 MED ORDER — EZETIMIBE 10 MG PO TABS
10.0000 mg | ORAL_TABLET | Freq: Every day | ORAL | Status: DC
  Administered 2021-01-01 – 2021-01-09 (×8): 10 mg via ORAL
  Filled 2020-12-31 (×10): qty 1

## 2020-12-31 MED ORDER — CLOPIDOGREL BISULFATE 75 MG PO TABS
75.0000 mg | ORAL_TABLET | Freq: Every day | ORAL | Status: DC
  Administered 2021-01-01 – 2021-01-09 (×9): 75 mg via ORAL
  Filled 2020-12-31 (×9): qty 1

## 2020-12-31 MED ORDER — LACTATED RINGERS IV SOLN: INTRAVENOUS | Status: DC

## 2020-12-31 MED ORDER — ALBUTEROL SULFATE (2.5 MG/3ML) 0.083% IN NEBU: 2.5000 mg | INHALATION_SOLUTION | Freq: Four times a day (QID) | RESPIRATORY_TRACT | Status: DC | PRN

## 2020-12-31 MED ORDER — TRAZODONE HCL 50 MG PO TABS
150.0000 mg | ORAL_TABLET | Freq: Every evening | ORAL | Status: DC | PRN
  Administered 2021-01-08 (×2): 150 mg via ORAL
  Filled 2020-12-31 (×2): qty 1

## 2020-12-31 MED ORDER — ATORVASTATIN CALCIUM 20 MG PO TABS
80.0000 mg | ORAL_TABLET | Freq: Every day | ORAL | Status: DC
  Administered 2021-01-01 – 2021-01-08 (×8): 80 mg via ORAL
  Filled 2020-12-31 (×9): qty 4

## 2020-12-31 MED ORDER — PIPERACILLIN-TAZOBACTAM 3.375 G IVPB
3.3750 g | Freq: Three times a day (TID) | INTRAVENOUS | Status: DC
  Administered 2021-01-01 – 2021-01-09 (×25): 3.375 g via INTRAVENOUS
  Filled 2020-12-31 (×24): qty 50

## 2020-12-31 MED ORDER — MORPHINE SULFATE (PF) 2 MG/ML IV SOLN
1.0000 mg | INTRAVENOUS | Status: DC | PRN
  Administered 2021-01-01 – 2021-01-04 (×2): 1 mg via INTRAVENOUS
  Filled 2020-12-31 (×2): qty 1

## 2020-12-31 MED ORDER — METHOCARBAMOL 500 MG PO TABS
500.0000 mg | ORAL_TABLET | Freq: Two times a day (BID) | ORAL | Status: DC | PRN
  Filled 2020-12-31: qty 1

## 2020-12-31 MED ORDER — MORPHINE SULFATE (PF) 4 MG/ML IV SOLN
4.0000 mg | Freq: Once | INTRAVENOUS | Status: AC
  Administered 2020-12-31: 4 mg via INTRAVENOUS
  Filled 2020-12-31: qty 1

## 2020-12-31 MED ORDER — DIVALPROEX SODIUM 500 MG PO DR TAB
1000.0000 mg | DELAYED_RELEASE_TABLET | Freq: Every day | ORAL | Status: DC
  Administered 2021-01-01 – 2021-01-09 (×9): 1000 mg via ORAL
  Filled 2020-12-31 (×9): qty 2

## 2020-12-31 MED ORDER — PANTOPRAZOLE SODIUM 40 MG PO TBEC
40.0000 mg | DELAYED_RELEASE_TABLET | Freq: Two times a day (BID) | ORAL | Status: DC
  Administered 2021-01-01 – 2021-01-06 (×11): 40 mg via ORAL
  Filled 2020-12-31 (×11): qty 1

## 2020-12-31 MED ORDER — HEPARIN SODIUM (PORCINE) 5000 UNIT/ML IJ SOLN
5000.0000 [IU] | Freq: Three times a day (TID) | INTRAMUSCULAR | Status: DC
  Administered 2021-01-01 – 2021-01-04 (×11): 5000 [IU] via SUBCUTANEOUS
  Filled 2020-12-31 (×12): qty 1

## 2020-12-31 MED ORDER — MOMETASONE FURO-FORMOTEROL FUM 200-5 MCG/ACT IN AERO
2.0000 | INHALATION_SPRAY | Freq: Two times a day (BID) | RESPIRATORY_TRACT | Status: DC
  Administered 2020-12-31 – 2021-01-09 (×18): 2 via RESPIRATORY_TRACT
  Filled 2020-12-31 (×2): qty 8.8

## 2020-12-31 MED ORDER — AMLODIPINE BESYLATE 5 MG PO TABS
10.0000 mg | ORAL_TABLET | Freq: Every day | ORAL | Status: DC
  Filled 2020-12-31: qty 2

## 2020-12-31 MED ORDER — ALBUTEROL SULFATE HFA 108 (90 BASE) MCG/ACT IN AERS: 1.0000 | INHALATION_SPRAY | Freq: Four times a day (QID) | RESPIRATORY_TRACT | Status: DC | PRN

## 2020-12-31 MED ORDER — BUDESON-GLYCOPYRROL-FORMOTEROL 160-9-4.8 MCG/ACT IN AERO: 2.0000 | INHALATION_SPRAY | Freq: Two times a day (BID) | RESPIRATORY_TRACT | Status: DC

## 2020-12-31 MED ORDER — ASPIRIN 81 MG PO CHEW
81.0000 mg | CHEWABLE_TABLET | Freq: Every day | ORAL | Status: DC
  Administered 2020-12-31 – 2021-01-09 (×10): 81 mg via ORAL
  Filled 2020-12-31 (×10): qty 1

## 2020-12-31 MED ORDER — PIPERACILLIN-TAZOBACTAM 3.375 G IVPB 30 MIN
3.3750 g | Freq: Once | INTRAVENOUS | Status: DC
  Filled 2020-12-31: qty 50

## 2020-12-31 MED ORDER — ONDANSETRON HCL 4 MG/2ML IJ SOLN
4.0000 mg | Freq: Once | INTRAMUSCULAR | Status: AC
  Administered 2020-12-31: 4 mg via INTRAVENOUS
  Filled 2020-12-31: qty 2

## 2020-12-31 MED ORDER — ISOSORBIDE MONONITRATE ER 30 MG PO TB24
60.0000 mg | ORAL_TABLET | Freq: Every day | ORAL | Status: DC
  Administered 2021-01-01 – 2021-01-09 (×9): 60 mg via ORAL
  Filled 2020-12-31: qty 2
  Filled 2020-12-31: qty 1
  Filled 2020-12-31 (×7): qty 2

## 2020-12-31 MED ORDER — POTASSIUM CHLORIDE CRYS ER 20 MEQ PO TBCR
40.0000 meq | EXTENDED_RELEASE_TABLET | Freq: Once | ORAL | Status: AC
  Administered 2020-12-31: 40 meq via ORAL
  Filled 2020-12-31: qty 2

## 2020-12-31 MED ORDER — LACTATED RINGERS IV BOLUS
1000.0000 mL | Freq: Once | INTRAVENOUS | Status: AC
  Administered 2020-12-31: 1000 mL via INTRAVENOUS

## 2020-12-31 MED ORDER — ONDANSETRON HCL 4 MG/2ML IJ SOLN
4.0000 mg | Freq: Four times a day (QID) | INTRAMUSCULAR | Status: DC | PRN
  Administered 2020-12-31 – 2021-01-02 (×5): 4 mg via INTRAVENOUS
  Filled 2020-12-31 (×5): qty 2

## 2020-12-31 NOTE — ED Provider Notes (Signed)
[facility] Emergency Department Provider Note   ____________________________________________   Event Date/Time   First MD Initiated Contact with Patient 12/31/20 1648     (approximate)  I have reviewed the triage vital signs and the nursing notes.   HISTORY  Chief Complaint Abdominal Pain and Emesis    HPI Shawna Anderson is a 55 y.o. female with past medical history of hypertension, hyperlipidemia, COPD, CAD, bipolar disorder, and chronic pain syndrome who presents to the ED complaining of abdominal pain.  Patient reports that she has had about 4 days of persistent nausea and vomiting with difficulty tolerating either liquids or solids.  She has not had any associated diarrhea and she denies any blood in her emesis or stool.  She does state that she has developed pain in the left lower quadrant of her abdomen that has been worsening over the past 2 days.  Pain is described as sharp and constant, not exacerbated or alleviated by anything in particular.  Her abdominal pain is similar to when she was admitted to the hospital for diverticulitis last month, she states she completed antibiotic course at that time.  She denies any dysuria, fever, or flank pain.        Past Medical History:  Diagnosis Date   Anxiety    Asthma    Bipolar 1 disorder ([place])    Bipolar disorder ([place])    CAD (coronary artery disease)    s/p stent BMS OM Cx   Cervical herniated disc 04/12/2016   COPD (chronic obstructive pulmonary disease) (HCC)    Depression    Diabetes mellitus without complication (HCC)    Diverticulitis    GERD (gastroesophageal reflux disease)    Glaucoma    History of blood transfusion    Hyperlipidemia    Hypertension    OSA (obstructive sleep apnea)    not using cpap    Plantar fasciitis    b/l feet s/p steroid shots w/o help and left surgery w/o help    UTI (urinary tract infection)     Patient Active Problem List   Diagnosis Date Noted    Acute diverticulitis 12/17/2020   Chronic pain syndrome 12/05/2020   Acute bronchitis with COPD ([place]) 11/28/2020   Cough 11/28/2020   Accelerating angina (HCC) 09/11/2020   Chronic nausea 03/26/2020   SUI (stress urinary incontinence, female) 03/26/2020   Bronchitis 03/26/2020   Continuous LLQ abdominal pain 03/01/2020   URI with cough and congestion 03/01/2020   Esophageal dysphagia 01/26/2020   Schatzki's ring of distal esophagus 01/26/2020   Ecchymosis 01/11/2020   Chronic pain of both knees 01/11/2020   Bruising 12/05/2019   Trichomonas infection 12/05/2019   Elevated liver enzymes 11/15/2019   Prediabetes 11/15/2019   STD exposure 11/15/2019   Anemia 11/15/2019   Chronic bursitis of right shoulder 10/25/2019   Chest wall pain 09/05/2019   Poor dentition 08/18/2019   Thrombocytosis 08/18/2019   Leukocytosis 08/18/2019   Anal lesion 08/18/2019   Female pelvic pain 05/26/2019   Colitis 05/26/2019   HLD (hyperlipidemia) 12/27/2018   Vitamin D deficiency 12/24/2018   Bipolar disorder ([place]) 12/30/2017   OSA (obstructive sleep apnea) 12/30/2017   Constipation 12/30/2017   Coronary artery disease of native artery of native heart with stable angina pectoris ([place]) 12/30/2017   Gastroesophageal reflux disease 12/30/2017   Asthma 12/29/2017   COPD (chronic obstructive pulmonary disease) ([place]) 12/29/2017   Anxiety and depression 12/29/2017   Insomnia 12/29/2017   Chronic back pain  12/29/2017   Skin lesion of back 12/29/2017   CAD S/P CFX PCI 2010 04/15/2016   Essential hypertension 04/15/2016   Noncompliance with medication regimen 04/15/2016   Chest pain with moderate risk of acute coronary syndrome 04/12/2016   Abdominal pain 01/12/2015   Vomiting 01/12/2015   Nausea and vomiting 01/12/2015    Past Surgical History:  Procedure Laterality Date   ABDOMINAL HYSTERECTOMY     ABDOMINAL SURGERY  1995   Bowel resection.   CARDIAC CATHETERIZATION N/A 04/14/2016   Procedure:  Left Heart Cath and Coronary Angiography;  Surgeon: Burnell Blanks, MD;  Location: [facility];  Service: Cardiovascular;  Laterality: N/A;   CARDIAC SURGERY     COLONOSCOPY WITH PROPOFOL N/A 07/11/2019   Procedure: COLONOSCOPY WITH PROPOFOL;  Surgeon: Jonathon Bellows, MD;  Location: [facility] ENDOSCOPY;  Service: Gastroenterology;  Laterality: N/A;   COLONOSCOPY WITH PROPOFOL N/A 07/29/2019   Procedure: COLONOSCOPY WITH PROPOFOL;  Surgeon: Jonathon Bellows, MD;  Location[facility] ENDOSCOPY;  Service: Gastroenterology;  Laterality: N/A;   CORONARY ANGIOPLASTY WITH STENT PLACEMENT  2010   Drug eluting stent   ESOPHAGOGASTRODUODENOSCOPY (EGD) WITH PROPOFOL N/A 07/11/2019   Procedure: ESOPHAGOGASTRODUODENOSCOPY (EGD) WITH PROPOFOL;  Surgeon: Jonathon Bellows, MD;  Location: [facility] ENDOSCOPY;  Service: Gastroenterology;  Laterality: N/A;   LEFT HEART CATH AND CORONARY ANGIOGRAPHY Left 09/11/2020   Procedure: LEFT HEART CATH AND CORONARY ANGIOGRAPHY;  Surgeon: Nelva Bush, MD;  Location: [facility];  Service: Cardiovascular;  Laterality: Left;   OVARIAN CYST REMOVAL      Prior to Admission medications   Medication Sig Start Date End Date Taking? Authorizing Provider  albuterol (VENTOLIN HFA) 108 (90 Base) MCG/ACT inhaler Inhale 1-2 puffs into the lungs every 6 (six) hours as needed for wheezing or shortness of breath. 12/05/20   McLean-Scocuzza, Nino Glow, MD  amLODipine (NORVASC) 10 MG tablet Take 1 tablet (10 mg total) by mouth at bedtime. 04/16/20   McLean-Scocuzza, Nino Glow, MD  aspirin 81 MG chewable tablet Chew 81 mg by mouth daily.    [provider]  atorvastatin (LIPITOR) 80 MG tablet Take 1 tablet (80 mg total) by mouth daily at 6 PM. 04/16/20 07/15/20  McLean-Scocuzza, Nino Glow, MD  Budeson-Glycopyrrol-Formoterol (BREZTRI AEROSPHERE) 160-9-4.8 MCG/ACT AERO Inhale 2 puffs into the lungs in the morning and at bedtime. Rinse 12/05/20   McLean-Scocuzza, Nino Glow, MD  clopidogrel (PLAVIX) 75  MG tablet Take 1 tablet (75 mg total) by mouth daily. 04/16/20   McLean-Scocuzza, Nino Glow, MD  divalproex (DEPAKOTE) 500 MG DR tablet TAKE 1 TABLET BY MOUTH  TWICE DAILY Patient taking differently: Take 500 mg by mouth 2 (two) times daily. 08/16/20   McLean-Scocuzza, Nino Glow, MD  Evolocumab (REPATHA SURECLICK) 314 MG/ML SOAJ Inject 1 mL into the skin every 14 (fourteen) days. 10/30/20 01/28/21  End, Harrell Gave, MD  ezetimibe (ZETIA) 10 MG tablet TAKE 1 TABLET BY MOUTH  DAILY WITH LIPITOR 80 MG AT NIGHT 11/26/20   McLean-Scocuzza, Nino Glow, MD  isosorbide mononitrate (IMDUR) 60 MG 24 hr tablet Take 1 tablet (60 mg total) by mouth daily. 09/19/20 03/18/21  Loel Dubonnet, NP  methocarbamol (ROBAXIN) 500 MG tablet Take 1 tablet (500 mg total) by mouth 2 (two) times daily as needed for muscle spasms. 12/03/20   McLean-Scocuzza, Nino Glow, MD  nitroGLYCERIN (NITROSTAT) 0.4 MG SL tablet DISSOLVE 1 TABLET UNDER  TONGUE EVERY 5 MINUTES AS  NEEDED FOR CHEST PAIN MAX  OF 3 TABLETS IN 15 MINUTES  .  CALL 911 AFTER THIRD DOSE 11/26/20   Loel Dubonnet, NP  pantoprazole (PROTONIX) 40 MG tablet TAKE 1 TABLET BY MOUTH  TWICE DAILY BEFORE MEALS Patient taking differently: Take 40 mg by mouth 2 (two) times daily before a meal. 01/31/20   Jonathon Bellows, MD  polyethylene glycol (MIRALAX / GLYCOLAX) 17 g packet Take 17 g by mouth daily. 12/20/20 01/19/21  Nolberto Hanlon, MD  traZODone (DESYREL) 150 MG tablet Take 1 tablet (150 mg total) by mouth at bedtime as needed for sleep. 04/16/20   McLean-Scocuzza, Nino Glow, MD    Allergies Ativan [lorazepam], Augmentin [amoxicillin-pot clavulanate], Latex, Tape, Xifaxan [rifaximin], and Drixoral allergy sinus [dexbromphen-pse-apap er]  Family History  Problem Relation Age of Onset   CAD Mother    Depression Mother    Heart disease Mother    Hyperlipidemia Mother    Hypertension Mother    CAD Brother    Depression Brother    Diabetes Brother    Heart disease Brother    Hyperlipidemia  Brother    Heart disease Father    Alcohol abuse Father    Lupus Other    Sickle cell anemia Other     Social History Social History   Tobacco Use   Smoking status: Every Day    Packs/day: 1.00    Years: 30.00    Pack years: 30.00    Types: Cigarettes    Last attempt to quit: 03/22/2020    Years since quitting: 0.7   Smokeless tobacco: Never   Tobacco comments:    0.5 PPD 10/02/2020  Vaping Use   Vaping Use: Never used  Substance Use Topics   Alcohol use: Not Currently    Comment: once a year   Drug use: Yes    Frequency: 7.0 times per week    Types: Marijuana    Review of Systems  Constitutional: No fever/chills Eyes: No visual changes. ENT: No sore throat. Cardiovascular: Denies chest pain. Respiratory: Denies shortness of breath. Gastrointestinal: Positive for abdominal pain, nausea, and vomiting.  No diarrhea.  No constipation. Genitourinary: Negative for dysuria. Musculoskeletal: Negative for back pain. Skin: Negative for rash. Neurological: Negative for headaches, focal weakness or numbness.  ____________________________________________   PHYSICAL EXAM:  VITAL SIGNS: ED Triage Vitals  Enc Vitals Group     BP 12/31/20 1528 (!) 113/97     Pulse Rate 12/31/20 1528 84     Resp 12/31/20 1528 19     Temp 12/31/20 1528 98.9 F (37.2 C)     Temp Source 12/31/20 1528 Oral     SpO2 12/31/20 1528 98 %     Weight 12/31/20 1517 191 lb 12.8 oz (87 kg)     Height 12/31/20 1517 5\' 3"  (1.6 m)     Head Circumference --      Peak Flow --      Pain Score 12/31/20 1516 5     Pain Loc --      Pain Edu? --      Excl. in [place]? --     Constitutional: Alert and oriented. Eyes: Conjunctivae are normal. Head: Atraumatic. Nose: No congestion/rhinnorhea. Mouth/Throat: Mucous membranes are moist. Neck: Normal ROM Cardiovascular: Normal rate, regular rhythm. Grossly normal heart sounds. Respiratory: Normal respiratory effort.  No retractions. Lungs  CTAB. Gastrointestinal: Soft and tender to palpation in the left lower quadrant with no rebound or guarding. No distention. Genitourinary: deferred Musculoskeletal: No lower extremity tenderness nor edema. Neurologic:  Normal speech and language. No gross focal  neurologic deficits are appreciated. Skin:  Skin is warm, dry and intact. No rash noted. Psychiatric: Mood and affect are normal. Speech and behavior are normal.  ____________________________________________   LABS (all labs ordered are listed, but only abnormal results are displayed)  Labs Reviewed  COMPREHENSIVE METABOLIC PANEL - Abnormal; Notable for the following components:      Result Value   Potassium 2.9 (*)    Glucose, Bld 135 (*)    Creatinine, Ser 1.90 (*)    AST 50 (*)    ALT 104 (*)    GFR, Estimated 31 (*)    All other components within normal limits  CBC - Abnormal; Notable for the following components:   WBC 14.9 (*)    All other components within normal limits  RESP PANEL BY RT-PCR (FLU A&B, COVID) ARPGX2  SARS CORONAVIRUS 2 (TAT 6-24 HRS)  LIPASE, BLOOD  MAGNESIUM  URINALYSIS, COMPLETE (UACMP) WITH MICROSCOPIC  COMPREHENSIVE METABOLIC PANEL  CBC    PROCEDURES  Procedure(s) performed (including Critical Care):  Procedures   ____________________________________________   INITIAL IMPRESSION / ASSESSMENT AND PLAN / ED COURSE      55 year old female with past medical history of hypertension, hyperlipidemia, CAD, COPD, bipolar disorder, and chronic pain syndrome who presents to the ED with 4 days of constant nausea and vomiting now with 2 days of worsening left lower quadrant abdominal pain.  She does have tenderness in her left lower quadrant and was admitted last month for diverticulitis, which I suspect has recurred.  We will further assess with CT scan to rule out any potential complication.  Labs remarkable for AKI, and unfortunately we will have to perform CT scan without contrast.  We will  hydrate with IV fluids, treat symptomatically with IV morphine and Zofran.  CT scan shows inflammatory changes consistent with diverticulitis, no evidence of abscess or other complication.  We will treat with Zosyn and continue IV fluid hydration for her AKI.  Patient has reported allergy to Augmentin of "upset stomach", benefits of Zosyn seem to outweigh risks at this time.  Case discussed with hospitalist for admission.      ____________________________________________   FINAL CLINICAL IMPRESSION(S) / ED DIAGNOSES  Final diagnoses:  Diverticulitis  AKI (acute kidney injury) [facility])     ED Discharge Orders     None        Note:  This document was prepared using Dragon voice recognition software and may include unintentional dictation errors.    Blake Divine, MD 12/31/20 1850

## 2020-12-31 NOTE — Telephone Encounter (Signed)
patient currently in ED.

## 2020-12-31 NOTE — ED Notes (Signed)
Patient reports pain and nausea at this time.

## 2020-12-31 NOTE — ED Notes (Signed)
Pharmacy called, unable to get zosyn out of pyxis, Pharmacist states medication needs to be re timed d/t length of delay from prescribed time. Awaiting verification.

## 2020-12-31 NOTE — H&P (Signed)
History and Physical    Shawna Anderson:[id number] DOB: [birth year] DOA: 12/31/2020  PCP: McLean-Scocuzza, Nino Glow, MD  Patient coming from: Home  I have personally briefly reviewed patient's old medical records in [place]  Chief Complaint: Nausea vomiting and abdominal pain  HPI: Shawna Anderson is a 55 y.o. female with medical history significant for CAD s/p PCI, hypertension, asthma/COPD, OSA not on CPAP, history of esophageal dysphagia with Schatzki's ring, s/p sigmoid colon resection for bowel perforation who presents with persistent nausea, vomiting and abdominal pain.  Patient was recently admitted for acute diverticulitis and discharged on 6/8 and was placed on Cipro and Flagyl.  She is not sure if she went back to her baseline after discharge from around the 16th she began to have left lower quadrant abdominal pain and will have nausea and vomiting after each meal.  Therapy been trying wonton soup.  States her vomitus was bilious and tasted like blood.  Denies any bloody diarrhea.  Denies any fever.  She was compliant with antibiotics although she is not sure if she might of mixed them up while she was sick. Has hx of bowel perforation requiring sigmoid colon resection, laparoscopic surgery for ovarian cysts and hysterectomy. Endorses smoking 2 cigarettes/day and daily marijuana use for anxiety with her bipolar disorder.  ED Course: She was afebrile, normotensive on room air.  WBC of 14.9.  Hypokalemia of 2.9.  AKI with creatinine of 1.90 from prior of 0.66.  AST of 50, ALT 104.  CT abdomen shows revealed acute diverticulitis and started on Zosyn.  Hospitalist then called for admission.  Review of Systems: Constitutional: No Weight Change, No Fever ENT/Mouth: No sore throat, No Rhinorrhea Eyes: No Vision Changes Cardiovascular: No Chest Pain, no SOB Respiratory: No Cough, No Sputum, No Wheezing, no Dyspnea  Gastrointestinal: + Nausea, No Vomiting, No  Diarrhea, No Constipation, + Pain Genitourinary: no Urinary Incontinence Musculoskeletal: No Arthralgias, No Myalgias Skin: No Skin Lesions, No Pruritus, Neuro: no Weakness, No Numbness, Psych: No Anxiety/Panic, No Depression, no decrease appetite Heme/Lymph: No Bruising, No Bleeding  Past Medical History:  Diagnosis Date   Anxiety    Asthma    Bipolar 1 disorder (HCC)    Bipolar disorder (HCC)    CAD (coronary artery disease)    s/p stent BMS OM Cx   Cervical herniated disc 04/12/2016   COPD (chronic obstructive pulmonary disease) (HCC)    Depression    Diabetes mellitus without complication (HCC)    Diverticulitis    GERD (gastroesophageal reflux disease)    Glaucoma    History of blood transfusion    Hyperlipidemia    Hypertension    OSA (obstructive sleep apnea)    not using cpap    Plantar fasciitis    b/l feet s/p steroid shots w/o help and left surgery w/o help    UTI (urinary tract infection)     Past Surgical History:  Procedure Laterality Date   ABDOMINAL HYSTERECTOMY     ABDOMINAL SURGERY  1995   Bowel resection.   CARDIAC CATHETERIZATION N/A 04/14/2016   Procedure: Left Heart Cath and Coronary Angiography;  Surgeon: Burnell Blanks, MD;  Location: [facility];  Service: Cardiovascular;  Laterality: N/A;   CARDIAC SURGERY     COLONOSCOPY WITH PROPOFOL N/A 07/11/2019   Procedure: COLONOSCOPY WITH PROPOFOL;  Surgeon: Jonathon Bellows, MD;  Location: [facility];  Service: Gastroenterology;  Laterality: N/A;   COLONOSCOPY WITH PROPOFOL N/A 07/29/2019   Procedure:  COLONOSCOPY WITH PROPOFOL;  Surgeon: Jonathon Bellows, MD;  Location: [facility] ENDOSCOPY;  Service: Gastroenterology;  Laterality: N/A;   CORONARY ANGIOPLASTY WITH STENT PLACEMENT  2010   Drug eluting stent   ESOPHAGOGASTRODUODENOSCOPY (EGD) WITH PROPOFOL N/A 07/11/2019   Procedure: ESOPHAGOGASTRODUODENOSCOPY (EGD) WITH PROPOFOL;  Surgeon: Jonathon Bellows, MD;  Location: [facility] ENDOSCOPY;  Service:  Gastroenterology;  Laterality: N/A;   LEFT HEART CATH AND CORONARY ANGIOGRAPHY Left 09/11/2020   Procedure: LEFT HEART CATH AND CORONARY ANGIOGRAPHY;  Surgeon: Nelva Bush, MD;  Location: [place] [facility];  Service: Cardiovascular;  Laterality: Left;   OVARIAN CYST REMOVAL       reports that she has been smoking cigarettes. She has a 30.00 pack-year smoking history. She has never used smokeless tobacco. She reports previous alcohol use. She reports current drug use. Frequency: 7.00 times per week. Drug: Marijuana. Social History  Allergies  Allergen Reactions   Ativan [Lorazepam]     Pt states it makes her tongue do weird things    Augmentin [Amoxicillin-Pot Clavulanate]     Upset stomach    Latex Other (See Comments)    Patient stated that she was told by her doctor that she is "allergic to" this   Tape Other (See Comments)    Patient stated that she was told by her doctor that she is "allergic to" this   Xifaxan [Rifaximin]     Pt believes this is contributing to her abdominal pain/discomfort   Drixoral Allergy Sinus [Dexbromphen-Pse-Apap Er] Rash    Family History  Problem Relation Age of Onset   CAD Mother    Depression Mother    Heart disease Mother    Hyperlipidemia Mother    Hypertension Mother    CAD Brother    Depression Brother    Diabetes Brother    Heart disease Brother    Hyperlipidemia Brother    Heart disease Father    Alcohol abuse Father    Lupus Other    Sickle cell anemia Other      Prior to Admission medications   Medication Sig Start Date End Date Taking? Authorizing Provider  albuterol (VENTOLIN HFA) 108 (90 Base) MCG/ACT inhaler Inhale 1-2 puffs into the lungs every 6 (six) hours as needed for wheezing or shortness of breath. 12/05/20   McLean-Scocuzza, Nino Glow, MD  amLODipine (NORVASC) 10 MG tablet Take 1 tablet (10 mg total) by mouth at bedtime. 04/16/20   McLean-Scocuzza, Nino Glow, MD  aspirin 81 MG chewable tablet Chew 81 mg by mouth  daily.    [provider]  atorvastatin (LIPITOR) 80 MG tablet Take 1 tablet (80 mg total) by mouth daily at 6 PM. 04/16/20 07/15/20  McLean-Scocuzza, Nino Glow, MD  Budeson-Glycopyrrol-Formoterol (BREZTRI AEROSPHERE) 160-9-4.8 MCG/ACT AERO Inhale 2 puffs into the lungs in the morning and at bedtime. Rinse 12/05/20   McLean-Scocuzza, Nino Glow, MD  clopidogrel (PLAVIX) 75 MG tablet Take 1 tablet (75 mg total) by mouth daily. 04/16/20   McLean-Scocuzza, Nino Glow, MD  divalproex (DEPAKOTE) 500 MG DR tablet TAKE 1 TABLET BY MOUTH  TWICE DAILY Patient taking differently: Take 500 mg by mouth 2 (two) times daily. 08/16/20   McLean-Scocuzza, Nino Glow, MD  Evolocumab (REPATHA SURECLICK) 502 MG/ML SOAJ Inject 1 mL into the skin every 14 (fourteen) days. 10/30/20 01/28/21  End, Harrell Gave, MD  ezetimibe (ZETIA) 10 MG tablet TAKE 1 TABLET BY MOUTH  DAILY WITH LIPITOR 80 MG AT NIGHT 11/26/20   McLean-Scocuzza, Nino Glow, MD  isosorbide mononitrate (IMDUR)  60 MG 24 hr tablet Take 1 tablet (60 mg total) by mouth daily. 09/19/20 03/18/21  Loel Dubonnet, NP  methocarbamol (ROBAXIN) 500 MG tablet Take 1 tablet (500 mg total) by mouth 2 (two) times daily as needed for muscle spasms. 12/03/20   McLean-Scocuzza, Nino Glow, MD  nitroGLYCERIN (NITROSTAT) 0.4 MG SL tablet DISSOLVE 1 TABLET UNDER  TONGUE EVERY 5 MINUTES AS  NEEDED FOR CHEST PAIN MAX  OF 3 TABLETS IN 15 MINUTES  . CALL 911 AFTER THIRD DOSE 11/26/20   Loel Dubonnet, NP  pantoprazole (PROTONIX) 40 MG tablet TAKE 1 TABLET BY MOUTH  TWICE DAILY BEFORE MEALS Patient taking differently: Take 40 mg by mouth 2 (two) times daily before a meal. 01/31/20   Jonathon Bellows, MD  polyethylene glycol (MIRALAX / GLYCOLAX) 17 g packet Take 17 g by mouth daily. 12/20/20 01/19/21  Nolberto Hanlon, MD  traZODone (DESYREL) 150 MG tablet Take 1 tablet (150 mg total) by mouth at bedtime as needed for sleep. 04/16/20   McLean-Scocuzza, Nino Glow, MD    Physical Exam: Vitals:   12/31/20 1517 12/31/20  1528 12/31/20 1712  BP:  (!) 113/97 102/80  Pulse:  84 99  Resp:  19 18  Temp:  98.9 F (37.2 C)   TempSrc:  Oral   SpO2:  98% 99%  Weight: 87 kg    Height: 5\' 3"  (1.6 m)      Constitutional: non-toxic appearing middle aged female laying flat and curled up in bed moaning  Vitals:   12/31/20 1517 12/31/20 1528 12/31/20 1712  BP:  (!) 113/97 102/80  Pulse:  84 99  Resp:  19 18  Temp:  98.9 F (37.2 C)   TempSrc:  Oral   SpO2:  98% 99%  Weight: 87 kg    Height: 5\' 3"  (1.6 m)     Eyes: PERRL, lids and conjunctivae normal ENMT: Mucous membranes are moist.  Neck: normal, supple Respiratory: clear to auscultation bilaterally, no wheezing, no crackles. Normal respiratory effort. No accessory muscle use.  Cardiovascular: Regular rate and rhythm, no murmurs / rubs / gallops. No extremity edema. Abdomen: LLQ tenderness, no masses palpated, no rebound tenderness, guarding or rigidity . Bowel sounds positive.  Musculoskeletal: no clubbing / cyanosis. No joint deformity upper and lower extremities. Good ROM, no contractures. Normal muscle tone.  Skin: no rashes, lesions, ulcers. No induration Neurologic: CN 2-12 grossly intact. Sensation intact, Strength 5/5 in all 4.  Psychiatric: Normal judgment and insight. Alert and oriented x 3. Normal mood.     Labs on Admission: I have personally reviewed following labs and imaging studies  CBC: Recent Labs  Lab 12/31/20 1515  WBC 14.9*  HGB 13.6  HCT 39.0  MCV 88.0  PLT 161   Basic Metabolic Panel: Recent Labs  Lab 12/31/20 1515 12/31/20 1757  NA 137  --   K 2.9*  --   CL 99  --   CO2 27  --   GLUCOSE 135*  --   BUN 17  --   CREATININE 1.90*  --   CALCIUM 8.9  --   MG  --  2.4   GFR: Estimated Creatinine Clearance: 35.8 mL/min (A) (by C-G formula based on SCr of 1.9 mg/dL (H)). Liver Function Tests: Recent Labs  Lab 12/31/20 1515  AST 50*  ALT 104*  ALKPHOS 79  BILITOT 0.6  PROT 7.4  ALBUMIN 4.2   Recent Labs   Lab 12/31/20 1515  LIPASE 28  No results for input(s): AMMONIA in the last 168 hours. Coagulation Profile: No results for input(s): INR, PROTIME in the last 168 hours. Cardiac Enzymes: No results for input(s): CKTOTAL, CKMB, CKMBINDEX, TROPONINI in the last 168 hours. BNP (last 3 results) No results for input(s): PROBNP in the last 8760 hours. HbA1C: No results for input(s): HGBA1C in the last 72 hours. CBG: No results for input(s): GLUCAP in the last 168 hours. Lipid Profile: Recent Labs    12/31/20 0854  CHOL 158  HDL 42  LDLCALC 72  TRIG 275*  CHOLHDL 3.8   Thyroid Function Tests: No results for input(s): TSH, T4TOTAL, FREET4, T3FREE, THYROIDAB in the last 72 hours. Anemia Panel: No results for input(s): VITAMINB12, FOLATE, FERRITIN, TIBC, IRON, RETICCTPCT in the last 72 hours. Urine analysis:    Component Value Date/Time   COLORURINE YELLOW (A) 12/17/2020 1537   APPEARANCEUR HAZY (A) 12/17/2020 1537   APPEARANCEUR Turbid (A) 12/31/2017 0912   LABSPEC 1.018 12/17/2020 1537   LABSPEC 1.028 10/26/2014 1717   PHURINE 7.0 12/17/2020 1537   GLUCOSEU NEGATIVE 12/17/2020 1537   GLUCOSEU Negative 10/26/2014 1717   HGBUR NEGATIVE 12/17/2020 1537   BILIRUBINUR NEGATIVE 12/17/2020 1537   BILIRUBINUR Negative 12/31/2017 0912   BILIRUBINUR Negative 10/26/2014 1717   KETONESUR NEGATIVE 12/17/2020 1537   PROTEINUR NEGATIVE 12/17/2020 1537   NITRITE NEGATIVE 12/17/2020 1537   LEUKOCYTESUR NEGATIVE 12/17/2020 1537   LEUKOCYTESUR Negative 10/26/2014 1717    Radiological Exams on Admission: CT Abdomen Pelvis Wo Contrast  Result Date: 12/31/2020 CLINICAL DATA:  Abdominal pain, emesis, diverticulitis suspected EXAM: CT ABDOMEN AND PELVIS WITHOUT CONTRAST TECHNIQUE: Multidetector CT imaging of the abdomen and pelvis was performed following the standard protocol without IV contrast. COMPARISON:  12/17/2020 FINDINGS: Lower chest: No acute abnormality. Hepatobiliary: No solid liver  abnormality is seen. No gallstones, gallbladder wall thickening, or biliary dilatation. Pancreas: Unremarkable. No pancreatic ductal dilatation or surrounding inflammatory changes. Spleen: Normal in size without significant abnormality. Adrenals/Urinary Tract: Adrenal glands are unremarkable. Kidneys are normal, without renal calculi, solid lesion, or hydronephrosis. Bladder is unremarkable. Stomach/Bowel: Stomach is within normal limits. Appendix appears normal. Status post sigmoid colon resection. There is there is redemonstrated relatively focal wall thickening and fat stranding about diverticula of the remnant proximal sigmoid, similar to prior examination (series 2, image 60). Vascular/Lymphatic: Aortic atherosclerosis. No enlarged abdominal or pelvic lymph nodes. Reproductive: Status post hysterectomy. Other: No abdominal wall hernia or abnormality. No abdominopelvic ascites. Musculoskeletal: No acute or significant osseous findings. IMPRESSION: Status post sigmoid colon resection. There is redemonstrated relatively focal wall thickening and fat stranding about diverticula of the remnant proximal sigmoid, similar to prior examination. Findings are consistent with acute diverticulitis. No evidence of perforation or abscess. Aortic Atherosclerosis (ICD10-I70.0). Electronically Signed   By: Eddie Candle M.D.   On: 12/31/2020 17:54      Assessment/Plan  Acute diverticulitis -Patient recently admitted from 6/6-6/8 for the same and was discharged on Cipro and Flagyl -Continue IV Zosyn -Full liquid diet for bowel rest -PRN morphine for moderate pain and Dilaudid for severe pain   AKI -Creatinine of 1.90 from a prior of 0.66 -Monitor with repeat labs following IV continuous LR  Hypokalemia -Potassium 2.9.  Replete with oral K  Mild transaminitis - AST of 50, ALT of 104 -Patient does not have any RUQ abdominal pain.  Monitor repeat CMP in the morning  History of CAD -Continue asthma, Plavix,  Zetia, Imdur HTN  -Continue amlodipine  OSA not on CPAP  GERD - Continue PPI  Bipolar disorder - Continue Depakote, trazodone  DVT prophylaxis:.Lovenox Code Status: Full Family Communication: Plan discussed with patient at bedside  disposition Plan: Home with observation Consults called:  Admission status: Observation  Level of care: Med-Surg  Status is: Observation  The patient remains OBS appropriate and will d/c before 2 midnights.  Dispo: The patient is from: Home              Anticipated d/c is to: Home              Patient currently is not medically stable to d/c.   Difficult to place patient No         Orene Desanctis DO [facility]   If 7PM-7AM, please contact night-coverage [email or web address]   12/31/2020, 6:54 PM

## 2020-12-31 NOTE — Telephone Encounter (Signed)
In ED

## 2020-12-31 NOTE — ED Notes (Signed)
Pt c/o N/V x 4 days with LLQ abdominal pain. Pt reports recent admission for diverticulitis.  Pt denies diarrhea or dark/bloody stool. Pt reports abdominal pain is worsened after eating broth at home. Pt sts she used Zofran PTA without relief of nausea.   Pt aware of need for urine sample and sts she is unable to go at this time.

## 2020-12-31 NOTE — Consult Note (Signed)
Pharmacy Antibiotic Note  Shawna Anderson is a 55 y.o. female admitted on 12/31/2020 with Nausea vomiting and abdominal pain.  Patient with PMH significant for s/p sigmoid colon resection for bowel perforation and recently admitted for acute diverticulitis and discharged on 6/8 and was placed on Cipro and Flagyl.Pharmacy has been consulted for Zosyn dosing for intra-abdominal infection.   Plan: Zosyn 3.375g IV q8h (4 hour infusion).  Height: 5\' 3"  (160 cm) Weight: 87 kg (191 lb 12.8 oz) IBW/kg (Calculated) : 52.4  Temp (24hrs), Avg:98.9 F (37.2 C), Min:98.9 F (37.2 C), Max:98.9 F (37.2 C)  Recent Labs  Lab 12/31/20 1515  WBC 14.9*  CREATININE 1.90*    Estimated Creatinine Clearance: 35.8 mL/min (A) (by C-G formula based on SCr of 1.9 mg/dL (H)).    Allergies  Allergen Reactions   Ativan [Lorazepam]     Pt states it makes her tongue do weird things    Augmentin [Amoxicillin-Pot Clavulanate]     Upset stomach    Latex Other (See Comments)    Patient stated that she was told by her doctor that she is "allergic to" this   Tape Other (See Comments)    Patient stated that she was told by her doctor that she is "allergic to" this   Xifaxan [Rifaximin]     Pt believes this is contributing to her abdominal pain/discomfort   Drixoral Allergy Sinus [Dexbromphen-Pse-Apap Er] Rash    Antimicrobials this admission: 6/20 zosyn >>    Dose adjustments this admission: N/a  Microbiology results: 6/20 BCx: sent  Thank you for allowing pharmacy to be a part of this patient's care.  Dorothe Pea, PharmD, BCPS Clinical Pharmacist   12/31/2020 7:31 PM

## 2020-12-31 NOTE — Telephone Encounter (Signed)
Patient called and states she has been vomiting since Saturday and can not stop, she work up this morning with swollen glands, more vomiting, and stomach pain. Patient was transferred to [facility] at [facility].

## 2020-12-31 NOTE — ED Triage Notes (Signed)
Pt comes into the ED via POV c/o abdominal pain and emesis since 12/27/20.  Pt recently admitted for diverticulitis.  Pt states she has been unable to keep anything down and now she is noticing the lymph nodes in her neck are swollen.  Pt ambulatory to triage at this time and in NAD.

## 2020-12-31 NOTE — ED Notes (Signed)
1 unsuccessful attempt by this RN to obtain blood cultures. Phlebotomist called to obtain cultures prior to starting abx.

## 2020-12-31 NOTE — ED Notes (Signed)
Pt ambulated to restroom independently. Pt sts she forgot to collect urine sample.

## 2021-01-01 ENCOUNTER — Telehealth: Payer: Self-pay

## 2021-01-01 DIAGNOSIS — Z9889 Other specified postprocedural states: Secondary | ICD-10-CM | POA: Diagnosis not present

## 2021-01-01 DIAGNOSIS — K6389 Other specified diseases of intestine: Secondary | ICD-10-CM | POA: Diagnosis not present

## 2021-01-01 DIAGNOSIS — K3184 Gastroparesis: Secondary | ICD-10-CM | POA: Diagnosis present

## 2021-01-01 DIAGNOSIS — N179 Acute kidney failure, unspecified: Secondary | ICD-10-CM | POA: Diagnosis not present

## 2021-01-01 DIAGNOSIS — R1013 Epigastric pain: Secondary | ICD-10-CM | POA: Diagnosis not present

## 2021-01-01 DIAGNOSIS — K5732 Diverticulitis of large intestine without perforation or abscess without bleeding: Secondary | ICD-10-CM | POA: Diagnosis present

## 2021-01-01 DIAGNOSIS — Z9861 Coronary angioplasty status: Secondary | ICD-10-CM | POA: Diagnosis not present

## 2021-01-01 DIAGNOSIS — R7401 Elevation of levels of liver transaminase levels: Secondary | ICD-10-CM | POA: Diagnosis not present

## 2021-01-01 DIAGNOSIS — R109 Unspecified abdominal pain: Secondary | ICD-10-CM | POA: Diagnosis present

## 2021-01-01 DIAGNOSIS — Z20822 Contact with and (suspected) exposure to covid-19: Secondary | ICD-10-CM | POA: Diagnosis present

## 2021-01-01 DIAGNOSIS — I2510 Atherosclerotic heart disease of native coronary artery without angina pectoris: Secondary | ICD-10-CM | POA: Diagnosis present

## 2021-01-01 DIAGNOSIS — Z9049 Acquired absence of other specified parts of digestive tract: Secondary | ICD-10-CM | POA: Diagnosis not present

## 2021-01-01 DIAGNOSIS — Z90710 Acquired absence of both cervix and uterus: Secondary | ICD-10-CM | POA: Diagnosis not present

## 2021-01-01 DIAGNOSIS — K529 Noninfective gastroenteritis and colitis, unspecified: Secondary | ICD-10-CM | POA: Diagnosis not present

## 2021-01-01 DIAGNOSIS — F17210 Nicotine dependence, cigarettes, uncomplicated: Secondary | ICD-10-CM | POA: Diagnosis present

## 2021-01-01 DIAGNOSIS — E785 Hyperlipidemia, unspecified: Secondary | ICD-10-CM | POA: Diagnosis present

## 2021-01-01 DIAGNOSIS — J449 Chronic obstructive pulmonary disease, unspecified: Secondary | ICD-10-CM | POA: Diagnosis present

## 2021-01-01 DIAGNOSIS — D72829 Elevated white blood cell count, unspecified: Secondary | ICD-10-CM | POA: Diagnosis not present

## 2021-01-01 DIAGNOSIS — Z6833 Body mass index (BMI) 33.0-33.9, adult: Secondary | ICD-10-CM | POA: Diagnosis not present

## 2021-01-01 DIAGNOSIS — F1210 Cannabis abuse, uncomplicated: Secondary | ICD-10-CM | POA: Diagnosis present

## 2021-01-01 DIAGNOSIS — E1143 Type 2 diabetes mellitus with diabetic autonomic (poly)neuropathy: Secondary | ICD-10-CM | POA: Diagnosis present

## 2021-01-01 DIAGNOSIS — K219 Gastro-esophageal reflux disease without esophagitis: Secondary | ICD-10-CM | POA: Diagnosis present

## 2021-01-01 DIAGNOSIS — Z955 Presence of coronary angioplasty implant and graft: Secondary | ICD-10-CM | POA: Diagnosis not present

## 2021-01-01 DIAGNOSIS — K5900 Constipation, unspecified: Secondary | ICD-10-CM | POA: Diagnosis not present

## 2021-01-01 DIAGNOSIS — R1032 Left lower quadrant pain: Secondary | ICD-10-CM | POA: Diagnosis not present

## 2021-01-01 DIAGNOSIS — F419 Anxiety disorder, unspecified: Secondary | ICD-10-CM | POA: Diagnosis present

## 2021-01-01 DIAGNOSIS — E876 Hypokalemia: Secondary | ICD-10-CM | POA: Diagnosis present

## 2021-01-01 DIAGNOSIS — K222 Esophageal obstruction: Secondary | ICD-10-CM | POA: Diagnosis present

## 2021-01-01 DIAGNOSIS — K5792 Diverticulitis of intestine, part unspecified, without perforation or abscess without bleeding: Secondary | ICD-10-CM | POA: Diagnosis not present

## 2021-01-01 DIAGNOSIS — F319 Bipolar disorder, unspecified: Secondary | ICD-10-CM | POA: Diagnosis present

## 2021-01-01 DIAGNOSIS — G4733 Obstructive sleep apnea (adult) (pediatric): Secondary | ICD-10-CM | POA: Diagnosis present

## 2021-01-01 DIAGNOSIS — K559 Vascular disorder of intestine, unspecified: Secondary | ICD-10-CM | POA: Diagnosis present

## 2021-01-01 DIAGNOSIS — E669 Obesity, unspecified: Secondary | ICD-10-CM | POA: Diagnosis present

## 2021-01-01 DIAGNOSIS — Z888 Allergy status to other drugs, medicaments and biological substances status: Secondary | ICD-10-CM | POA: Diagnosis not present

## 2021-01-01 DIAGNOSIS — I10 Essential (primary) hypertension: Secondary | ICD-10-CM | POA: Diagnosis present

## 2021-01-01 DIAGNOSIS — K921 Melena: Secondary | ICD-10-CM | POA: Diagnosis not present

## 2021-01-01 DIAGNOSIS — R112 Nausea with vomiting, unspecified: Secondary | ICD-10-CM | POA: Diagnosis not present

## 2021-01-01 LAB — COMPREHENSIVE METABOLIC PANEL
ALT: 75 U/L — ABNORMAL HIGH (ref 0–44)
AST: 35 U/L (ref 15–41)
Albumin: 3.3 g/dL — ABNORMAL LOW (ref 3.5–5.0)
Alkaline Phosphatase: 60 U/L (ref 38–126)
Anion gap: 8 (ref 5–15)
BUN: 18 mg/dL (ref 6–20)
CO2: 29 mmol/L (ref 22–32)
Calcium: 8.2 mg/dL — ABNORMAL LOW (ref 8.9–10.3)
Chloride: 98 mmol/L (ref 98–111)
Creatinine, Ser: 1.42 mg/dL — ABNORMAL HIGH (ref 0.44–1.00)
GFR, Estimated: 44 mL/min — ABNORMAL LOW (ref >60)
Glucose, Bld: 134 mg/dL — ABNORMAL HIGH (ref 70–99)
Potassium: 3 mmol/L — ABNORMAL LOW (ref 3.5–5.1)
Sodium: 135 mmol/L (ref 135–145)
Total Bilirubin: 0.6 mg/dL (ref 0.3–1.2)
Total Protein: 6 g/dL — ABNORMAL LOW (ref 6.5–8.1)

## 2021-01-01 LAB — CBC
HCT: 31.5 % — ABNORMAL LOW (ref 36.0–46.0)
Hemoglobin: 11 g/dL — ABNORMAL LOW (ref 12.0–15.0)
MCH: 31 pg (ref 26.0–34.0)
MCHC: 34.9 g/dL (ref 30.0–36.0)
MCV: 88.7 fL (ref 80.0–100.0)
Platelets: 291 10*3/uL (ref 150–400)
RBC: 3.55 MIL/uL — ABNORMAL LOW (ref 3.87–5.11)
RDW: 13.7 % (ref 11.5–15.5)
WBC: 13.1 10*3/uL — ABNORMAL HIGH (ref 4.0–10.5)
nRBC: 0 % (ref 0.0–0.2)

## 2021-01-01 LAB — URINALYSIS, COMPLETE (UACMP) WITH MICROSCOPIC
Bilirubin Urine: NEGATIVE
Glucose, UA: NEGATIVE mg/dL
Hgb urine dipstick: NEGATIVE
Ketones, ur: NEGATIVE mg/dL
Nitrite: NEGATIVE
Protein, ur: NEGATIVE mg/dL
Specific Gravity, Urine: 1.024 (ref 1.005–1.030)
pH: 5 (ref 5.0–8.0)

## 2021-01-01 LAB — OCCULT BLOOD X 1 CARD TO LAB, STOOL: Fecal Occult Bld: NEGATIVE

## 2021-01-01 MED ORDER — POTASSIUM CHLORIDE CRYS ER 20 MEQ PO TBCR
40.0000 meq | EXTENDED_RELEASE_TABLET | ORAL | Status: AC
  Administered 2021-01-01 (×3): 40 meq via ORAL
  Filled 2021-01-01 (×3): qty 2

## 2021-01-01 MED ORDER — ICOSAPENT ETHYL 1 G PO CAPS: 2.0000 g | ORAL_CAPSULE | Freq: Two times a day (BID) | ORAL | 3 refills | Status: DC

## 2021-01-01 MED ORDER — ALUM & MAG HYDROXIDE-SIMETH 200-200-20 MG/5ML PO SUSP
15.0000 mL | Freq: Four times a day (QID) | ORAL | Status: DC | PRN
  Administered 2021-01-01 (×2): 15 mL via ORAL
  Filled 2021-01-01 (×2): qty 30

## 2021-01-01 MED ORDER — SODIUM CHLORIDE 0.9 % IV SOLN
12.5000 mg | Freq: Four times a day (QID) | INTRAVENOUS | Status: DC | PRN
  Administered 2021-01-01 – 2021-01-06 (×2): 12.5 mg via INTRAVENOUS
  Filled 2021-01-01: qty 12.5
  Filled 2021-01-01: qty 0.5
  Filled 2021-01-01: qty 12.5

## 2021-01-01 MED ORDER — AMLODIPINE BESYLATE 5 MG PO TABS
5.0000 mg | ORAL_TABLET | Freq: Every day | ORAL | Status: DC
  Administered 2021-01-01 – 2021-01-02 (×2): 5 mg via ORAL
  Filled 2021-01-01 (×2): qty 1

## 2021-01-01 NOTE — Progress Notes (Signed)
Progress Note    ALDONA Anderson  [id number]:[id number] DOB: [birth year]  DOA: 12/31/2020 PCP: McLean-Scocuzza, Nino Glow, MD    Brief Narrative:     Medical records reviewed and are as summarized below:  Shawna Anderson is an 55 y.o. female with medical history significant for CAD s/p PCI, hypertension, asthma/COPD, OSA not on CPAP, history of esophageal dysphagia with Schatzki's ring, s/p sigmoid colon resection for bowel perforation who presents with persistent nausea, vomiting and abdominal pain.  Found to have hypokalemia and AKI.    Assessment/Plan:   Principal Problem:   Acute diverticulitis Active Problems:   CAD S/P CFX PCI 2010   Bipolar disorder (HCC)   OSA (obstructive sleep apnea)   Gastroesophageal reflux disease   AKI (acute kidney injury) ([place])   Hypokalemia   Transaminitis   Acute diverticulitis -Patient recently admitted from 6/6-6/8 for the same and was discharged on Cipro and Flagyl -Continue IV Zosyn -Full liquid diet for bowel rest -PRN morphine for moderate pain and Dilaudid for severe pain    AKI -Creatinine of 1.90 from a prior of 0.66 -IVF   Hypokalemia -continue to replete   Mild transaminitis - trending down   History of CAD -Continue asthma, Plavix, Zetia, Imdur HTN -Continue amlodipine   OSA not on CPAP   GERD - Continue PPI   Bipolar disorder - Continue Depakote, trazodone  obesity Body mass index is 33.98 kg/m.   Family Communication/Anticipated D/C date and plan/Code Status   DVT prophylaxis: heparin Code Status: Full Code.  Disposition Plan: Status is: Observation  The patient will require care spanning > 2 midnights and should be moved to inpatient because: IV treatments appropriate due to intensity of illness or inability to take PO  Dispo: The patient is from: Home              Anticipated d/c is to: Home              Patient currently is not medically stable to d/c.- need improvement in pain, advancement  in diet,    Difficult to place patient No         Medical Consultants:   None.   Subjective:   Pain worse after eating Denies diarrhea  Objective:    Vitals:   12/31/20 2120 01/01/21 0004 01/01/21 0426 01/01/21 0820  BP: 123/74 99/74 124/67 117/62  Pulse: (!) 103 100 75 76  Resp: 18 20 20 18   Temp:  98.3 F (36.8 C) 98.9 F (37.2 C)   TempSrc:  Oral Oral   SpO2: 99% 96% 98% 98%  Weight:      Height:       No intake or output data in the 24 hours ending 01/01/21 0909 Filed Weights   12/31/20 1517  Weight: 87 kg    Exam:  General: Appearance:    Obese female in no acute distress   +BS, tender to palpation on left side  Lungs:     respirations unlabored  Heart:    Normal heart rate.    MS:   All extremities are intact.    Neurologic:   Awake, alert, oriented x 3. No apparent focal neurological           defect.      Data Reviewed:   I have personally reviewed following labs and imaging studies:  Labs: Labs show the following:   Basic Metabolic Panel: Recent Labs  Lab 12/31/20 1515 12/31/20 1757 01/01/21 0422  NA  137  --  135  K 2.9*  --  3.0*  CL 99  --  98  CO2 27  --  29  GLUCOSE 135*  --  134*  BUN 17  --  18  CREATININE 1.90*  --  1.42*  CALCIUM 8.9  --  8.2*  MG  --  2.4  --    GFR Estimated Creatinine Clearance: 47.9 mL/min (A) (by C-G formula based on SCr of 1.42 mg/dL (H)). Liver Function Tests: Recent Labs  Lab 12/31/20 1515 01/01/21 0422  AST 50* 35  ALT 104* 75*  ALKPHOS 79 60  BILITOT 0.6 0.6  PROT 7.4 6.0*  ALBUMIN 4.2 3.3*   Recent Labs  Lab 12/31/20 1515  LIPASE 28   No results for input(s): AMMONIA in the last 168 hours. Coagulation profile No results for input(s): INR, PROTIME in the last 168 hours.  CBC: Recent Labs  Lab 12/31/20 1515 01/01/21 0422  WBC 14.9* 13.1*  HGB 13.6 11.0*  HCT 39.0 31.5*  MCV 88.0 88.7  PLT 374 291   Cardiac Enzymes: No results for input(s): CKTOTAL, CKMB, CKMBINDEX,  TROPONINI in the last 168 hours. BNP (last 3 results) No results for input(s): PROBNP in the last 8760 hours. CBG: No results for input(s): GLUCAP in the last 168 hours. D-Dimer: No results for input(s): DDIMER in the last 72 hours. Hgb A1c: No results for input(s): HGBA1C in the last 72 hours. Lipid Profile: Recent Labs    12/31/20 0854  CHOL 158  HDL 42  LDLCALC 72  TRIG 275*  CHOLHDL 3.8   Thyroid function studies: No results for input(s): TSH, T4TOTAL, T3FREE, THYROIDAB in the last 72 hours.  Invalid input(s): FREET3 Anemia work up: No results for input(s): VITAMINB12, FOLATE, FERRITIN, TIBC, IRON, RETICCTPCT in the last 72 hours. Sepsis Labs: Recent Labs  Lab 12/31/20 1515 01/01/21 0422  WBC 14.9* 13.1*    Microbiology Recent Results (from the past 240 hour(s))  CULTURE, BLOOD (ROUTINE X 2) w Reflex to ID Panel     Status: None (Preliminary result)   Collection Time: 12/31/20  7:19 PM   Specimen: BLOOD  Result Value Ref Range Status   Specimen Description BLOOD RIGHT ANTECUBITAL  Final   Special Requests   Final    BOTTLES DRAWN AEROBIC AND ANAEROBIC Blood Culture adequate volume   Culture   Final    NO GROWTH < 12 HOURS Performed at [facility], [address], [place], [place] [address]    Report Status PENDING  Incomplete  CULTURE, BLOOD (ROUTINE X 2) w Reflex to ID Panel     Status: None (Preliminary result)   Collection Time: 12/31/20  7:25 PM   Specimen: BLOOD  Result Value Ref Range Status   Specimen Description BLOOD BLOOD RIGHT HAND  Final   Special Requests   Final    BOTTLES DRAWN AEROBIC AND ANAEROBIC Blood Culture adequate volume   Culture   Final    NO GROWTH < 12 HOURS Performed at [facility], [address], [place], [place] [address]    Report Status PENDING  Incomplete  Resp Panel by RT-PCR (Flu A&B, Covid) Nasopharyngeal Swab     Status: None   Collection Time: 12/31/20  9:13 PM   Specimen: Nasopharyngeal  Swab; Nasopharyngeal(NP) swabs in vial transport medium  Result Value Ref Range Status   SARS Coronavirus 2 by RT PCR NEGATIVE NEGATIVE Final    Comment: (NOTE) SARS-CoV-2 target nucleic acids are  NOT DETECTED.  The SARS-CoV-2 RNA is generally detectable in upper respiratory specimens during the acute phase of infection. The lowest concentration of SARS-CoV-2 viral copies this assay can detect is 138 copies/mL. A negative result does not preclude SARS-Cov-2 infection and should not be used as the sole basis for treatment or other patient management decisions. A negative result may occur with  improper specimen collection/handling, submission of specimen other than nasopharyngeal swab, presence of viral mutation(s) within the areas targeted by this assay, and inadequate number of viral copies(<138 copies/mL). A negative result must be combined with clinical observations, patient history, and epidemiological information. The expected result is Negative.  Fact Sheet for Patients:  EntrepreneurPulse.com.au  Fact Sheet for Healthcare Providers:  IncredibleEmployment.be  This test is no t yet approved or cleared by the [place] FDA and  has been authorized for detection and/or diagnosis of SARS-CoV-2 by FDA under an Emergency Use Authorization (EUA). This EUA will remain  in effect (meaning this test can be used) for the duration of the COVID-19 declaration under Section 564(b)(1) of the Act, 21 U.S.C.section 360bbb-3(b)(1), unless the authorization is terminated  or revoked sooner.       Influenza A by PCR NEGATIVE NEGATIVE Final   Influenza B by PCR NEGATIVE NEGATIVE Final    Comment: (NOTE) The Xpert Xpress SARS-CoV-2/FLU/RSV plus assay is intended as an aid in the diagnosis of influenza from Nasopharyngeal swab specimens and should not be used as a sole basis for treatment. Nasal washings and aspirates are unacceptable for Xpert Xpress  SARS-CoV-2/FLU/RSV testing.  Fact Sheet for Patients: EntrepreneurPulse.com.au  Fact Sheet for Healthcare Providers: IncredibleEmployment.be  This test is not yet approved or cleared by the [place] FDA and has been authorized for detection and/or diagnosis of SARS-CoV-2 by FDA under an Emergency Use Authorization (EUA). This EUA will remain in effect (meaning this test can be used) for the duration of the COVID-19 declaration under Section 564(b)(1) of the Act, 21 U.S.C. section 360bbb-3(b)(1), unless the authorization is terminated or revoked.  Performed at [facility], [address] [place], [place] [address]     Procedures and diagnostic studies:  CT Abdomen Pelvis Wo Contrast  Result Date: 12/31/2020 CLINICAL DATA:  Abdominal pain, emesis, diverticulitis suspected EXAM: CT ABDOMEN AND PELVIS WITHOUT CONTRAST TECHNIQUE: Multidetector CT imaging of the abdomen and pelvis was performed following the standard protocol without IV contrast. COMPARISON:  12/17/2020 FINDINGS: Lower chest: No acute abnormality. Hepatobiliary: No solid liver abnormality is seen. No gallstones, gallbladder wall thickening, or biliary dilatation. Pancreas: Unremarkable. No pancreatic ductal dilatation or surrounding inflammatory changes. Spleen: Normal in size without significant abnormality. Adrenals/Urinary Tract: Adrenal glands are unremarkable. Kidneys are normal, without renal calculi, solid lesion, or hydronephrosis. Bladder is unremarkable. Stomach/Bowel: Stomach is within normal limits. Appendix appears normal. Status post sigmoid colon resection. There is there is redemonstrated relatively focal wall thickening and fat stranding about diverticula of the remnant proximal sigmoid, similar to prior examination (series 2, image 60). Vascular/Lymphatic: Aortic atherosclerosis. No enlarged abdominal or pelvic lymph nodes. Reproductive: Status post  hysterectomy. Other: No abdominal wall hernia or abnormality. No abdominopelvic ascites. Musculoskeletal: No acute or significant osseous findings. IMPRESSION: Status post sigmoid colon resection. There is redemonstrated relatively focal wall thickening and fat stranding about diverticula of the remnant proximal sigmoid, similar to prior examination. Findings are consistent with acute diverticulitis. No evidence of perforation or abscess. Aortic Atherosclerosis (ICD10-I70.0). Electronically Signed   By: Eddie Candle M.D.   On:  12/31/2020 17:54    Medications:    amLODipine  10 mg Oral QHS   aspirin  81 mg Oral Daily   atorvastatin  80 mg Oral q1800   clopidogrel  75 mg Oral Daily   divalproex  1,000 mg Oral Daily   ezetimibe  10 mg Oral Daily   heparin  5,000 Units Subcutaneous Q8H   isosorbide mononitrate  60 mg Oral Daily   mometasone-formoterol  2 puff Inhalation BID   pantoprazole  40 mg Oral BID AC   potassium chloride  40 mEq Oral Q4H   tiotropium  18 mcg Inhalation Daily   Continuous Infusions:  lactated ringers 75 mL/hr at 12/31/20 2058   piperacillin-tazobactam (ZOSYN)  IV Stopped (01/01/21 0818)   promethazine (PHENERGAN) injection (IM or IVPB) Stopped (01/01/21 0413)     LOS: 0 days   Geradine Girt  [facility]   How to contact the [facility] Attending or Consulting provider [place] or covering provider during after hours [place], for this patient?  Check the care team in [facility] and look for a) attending/consulting [facility] provider listed and b) the [facility] team listed Log into [email or web address] and use [place]'s universal password to access. If you do not have the password, please contact the hospital operator. Locate the [facility] provider you are looking for under [facility] and page to a number that you can be directly reached. If you still have difficulty reaching the provider, please page the [facility] (Director on Call) for the Hospitalists listed on amion for  assistance.  01/01/2021, 9:09 AM

## 2021-01-01 NOTE — ED Notes (Signed)
Dee RN aware of assigned bed 

## 2021-01-01 NOTE — Telephone Encounter (Signed)
Patient has seen results and provider recommendations released through my chart.  Pharmacist recommends that she start Vascepa 2g PO BID.  She is agreeable to plan but expresses that she is currently in the hospital and would like for me to send prescription to mail in pharmacy.

## 2021-01-01 NOTE — Telephone Encounter (Signed)
-----   Message from Loel Dubonnet, NP sent at 12/31/2020  5:11 PM EDT ----- Seen by lipid clinic and started on Repatha. LDL improved from 145 to 72. LDL not quite at goal of <70 but much improved! Continue Repatha, low cholesterol diet, and regular exercise. Total cholesterol also much improved. Triglycerides remain elevated - please reduce intake of carbohydrates, sweets, sugars.  Will CC Karren Cobble, [facility] for any additional recommendations.

## 2021-01-02 LAB — CBC
HCT: 30.9 % — ABNORMAL LOW (ref 36.0–46.0)
Hemoglobin: 10.4 g/dL — ABNORMAL LOW (ref 12.0–15.0)
MCH: 30.6 pg (ref 26.0–34.0)
MCHC: 33.7 g/dL (ref 30.0–36.0)
MCV: 90.9 fL (ref 80.0–100.0)
Platelets: 340 10*3/uL (ref 150–400)
RBC: 3.4 MIL/uL — ABNORMAL LOW (ref 3.87–5.11)
RDW: 13.7 % (ref 11.5–15.5)
WBC: 9.6 10*3/uL (ref 4.0–10.5)
nRBC: 0 % (ref 0.0–0.2)

## 2021-01-02 LAB — BASIC METABOLIC PANEL
Anion gap: 6 (ref 5–15)
BUN: 11 mg/dL (ref 6–20)
CO2: 31 mmol/L (ref 22–32)
Calcium: 8.5 mg/dL — ABNORMAL LOW (ref 8.9–10.3)
Chloride: 102 mmol/L (ref 98–111)
Creatinine, Ser: 0.82 mg/dL (ref 0.44–1.00)
GFR, Estimated: >60 mL/min (ref >60)
Glucose, Bld: 124 mg/dL — ABNORMAL HIGH (ref 70–99)
Potassium: 3.7 mmol/L (ref 3.5–5.1)
Sodium: 139 mmol/L (ref 135–145)

## 2021-01-02 MED ORDER — KETOROLAC TROMETHAMINE 30 MG/ML IJ SOLN
30.0000 mg | Freq: Once | INTRAMUSCULAR | Status: AC
  Administered 2021-01-02: 30 mg via INTRAVENOUS
  Filled 2021-01-02: qty 1

## 2021-01-02 MED ORDER — LIDOCAINE VISCOUS HCL 2 % MT SOLN
15.0000 mL | Freq: Four times a day (QID) | OROMUCOSAL | Status: DC | PRN
  Filled 2021-01-02: qty 15

## 2021-01-02 MED ORDER — PROCHLORPERAZINE EDISYLATE 10 MG/2ML IJ SOLN
10.0000 mg | Freq: Once | INTRAMUSCULAR | Status: AC
  Administered 2021-01-02: 10 mg via INTRAVENOUS
  Filled 2021-01-02: qty 2

## 2021-01-02 MED ORDER — EZETIMIBE 10 MG PO TABS
10.0000 mg | ORAL_TABLET | Freq: Once | ORAL | Status: AC
  Administered 2021-01-02: 10 mg via ORAL
  Filled 2021-01-02: qty 1

## 2021-01-02 MED ORDER — ACETAMINOPHEN 325 MG PO TABS
650.0000 mg | ORAL_TABLET | ORAL | Status: DC | PRN
  Administered 2021-01-03 – 2021-01-06 (×3): 650 mg via ORAL
  Filled 2021-01-02 (×3): qty 2

## 2021-01-02 MED ORDER — ALUM & MAG HYDROXIDE-SIMETH 200-200-20 MG/5ML PO SUSP: 30.0000 mL | Freq: Four times a day (QID) | ORAL | Status: DC | PRN

## 2021-01-02 NOTE — Progress Notes (Addendum)
PROGRESS NOTE    Shawna Anderson  [id number]:[id number] DOB: Mar 29, 1966 DOA: 12/31/2020 PCP: McLean-Scocuzza, Nino Glow, MD    Chief Complaint  Patient presents with   Abdominal Pain   Emesis    Brief Narrative:  Shawna Anderson is an 55 y.o. female with medical history significant for CAD s/p PCI, hypertension, asthma/COPD, OSA not on CPAP, history of esophageal dysphagia with Schatzki's ring, s/p sigmoid colon resection for bowel perforation who presents with persistent nausea, vomiting and abdominal pain.  Found to have hypokalemia and AKI and recurrent acute diverticulitis on CT abdomen and pelvis.       Assessment & Plan:   Principal Problem:   Acute diverticulitis Active Problems:   CAD S/P CFX PCI 2010   Bipolar disorder (HCC)   OSA (obstructive sleep apnea)   Gastroesophageal reflux disease   AKI (acute kidney injury) ([place])   Hypokalemia   Transaminitis   Diverticulitis   1 recurrent acute diverticulitis -Patient noted to have recently admitted from 6/6-12/19/2020 for acute diverticulitis and noted to have been discharged on ciprofloxacin and Flagyl. -Some clinical improvement with abdominal pain. -Patient still with complaints of abdominal discomfort with oral intake. -Afebrile. -Leukocytosis trending down. -Continue current pain management. -IV Zosyn. -Full liquid diet -Consult with GI for further evaluation and management. -May need input from general surgery.  2.  Acute kidney injury -Secondary to prerenal azotemia. -Improved with hydration. -Follow.  3.  Hypokalemia -Likely secondary to GI losses. -Repleted.  Potassium at 3.7.  4.  Mild transaminitis -Improving with hydration. -Follow.  5.  History of coronary artery disease -Stable. -Continue current regimen of, aspirin, Lipitor, Plavix, Zetia, Imdur.  6.  Hypertension -Continue Norvasc, Imdur.  7.  GERD PPI.  8.  Bipolar disorder -Stable. -Continue Depakote, trazodone.  9.   Obesity  10.  OSA -Not on CPAP.   DVT prophylaxis: Heparin Code Status: Full Family Communication: Updated patient.  No family at bedside. Disposition:   Status is: Inpatient  Remains inpatient appropriate because:IV treatments appropriate due to intensity of illness or inability to take PO  Dispo: The patient is from: Home              Anticipated d/c is to: Home              Patient currently is not medically stable to d/c.   Difficult to place patient No       Consultants:  Gastroenterology pending  Procedures:  CT abdomen and pelvis 12/31/2020    Antimicrobials: IV Zosyn 12/31/2020>>>>   Subjective: Patient c/o bubling sensation when she eats with associated belching and abd pain and nausea. No emesis. Some gaging. Overall left LLQ pain improving.  Asking whether her gastroenterologist is going to be able to see her.  Objective: Vitals:   01/01/21 1720 01/02/21 0300 01/02/21 0422 01/02/21 0838  BP: 106/64  124/66 112/82  Pulse: 83  82 79  Resp: 18  20 18   Temp: 98.9 F (37.2 C) 98.8 F (37.1 C)  98.8 F (37.1 C)  TempSrc: Oral Oral  Oral  SpO2: 98%  92% 100%  Weight:      Height:        Intake/Output Summary (Last 24 hours) at 01/02/2021 1055 Last data filed at 01/02/2021 1016 Gross per 24 hour  Intake 2719.6 ml  Output --  Net 2719.6 ml   Filed Weights   12/31/20 1517  Weight: 87 kg    Examination:  General exam: Appears calm and  comfortable  Respiratory system: Clear to auscultation. Respiratory effort normal. Cardiovascular system: S1 & S2 heard, RRR. No JVD, murmurs, rubs, gallops or clicks. No pedal edema. Gastrointestinal system: Tenderness to palpation in the epigastrium, left lower quadrant.  Positive bowel sounds.  Soft, nondistended, no rebound.  No guarding.  Central nervous system: Alert and oriented. No focal neurological deficits. Extremities: Symmetric 5 x 5 power. Skin: No rashes, lesions or ulcers Psychiatry: Judgement and  insight appear normal. Mood & affect appropriate.     Data Reviewed: I have personally reviewed following labs and imaging studies  CBC: Recent Labs  Lab 12/31/20 1515 01/01/21 0422 01/02/21 0500  WBC 14.9* 13.1* 9.6  HGB 13.6 11.0* 10.4*  HCT 39.0 31.5* 30.9*  MCV 88.0 88.7 90.9  PLT 374 291 009    Basic Metabolic Panel: Recent Labs  Lab 12/31/20 1515 12/31/20 1757 01/01/21 0422 01/02/21 0500  NA 137  --  135 139  K 2.9*  --  3.0* 3.7  CL 99  --  98 102  CO2 27  --  29 31  GLUCOSE 135*  --  134* 124*  BUN 17  --  18 11  CREATININE 1.90*  --  1.42* 0.82  CALCIUM 8.9  --  8.2* 8.5*  MG  --  2.4  --   --     GFR: Estimated Creatinine Clearance: 82.9 mL/min (by C-G formula based on SCr of 0.82 mg/dL).  Liver Function Tests: Recent Labs  Lab 12/31/20 1515 01/01/21 0422  AST 50* 35  ALT 104* 75*  ALKPHOS 79 60  BILITOT 0.6 0.6  PROT 7.4 6.0*  ALBUMIN 4.2 3.3*    CBG: No results for input(s): GLUCAP in the last 168 hours.   Recent Results (from the past 240 hour(s))  CULTURE, BLOOD (ROUTINE X 2) w Reflex to ID Panel     Status: None (Preliminary result)   Collection Time: 12/31/20  7:19 PM   Specimen: BLOOD  Result Value Ref Range Status   Specimen Description BLOOD RIGHT ANTECUBITAL  Final   Special Requests   Final    BOTTLES DRAWN AEROBIC AND ANAEROBIC Blood Culture adequate volume   Culture   Final    NO GROWTH 2 DAYS Performed at [facility], [address], [place], [place] [address]    Report Status PENDING  Incomplete  CULTURE, BLOOD (ROUTINE X 2) w Reflex to ID Panel     Status: None (Preliminary result)   Collection Time: 12/31/20  7:25 PM   Specimen: BLOOD  Result Value Ref Range Status   Specimen Description BLOOD BLOOD RIGHT HAND  Final   Special Requests   Final    BOTTLES DRAWN AEROBIC AND ANAEROBIC Blood Culture adequate volume   Culture   Final    NO GROWTH 2 DAYS Performed at [facility], [address], [place], [place] [address]    Report Status PENDING  Incomplete  Resp Panel by RT-PCR (Flu A&B, Covid) Nasopharyngeal Swab     Status: None   Collection Time: 12/31/20  9:13 PM   Specimen: Nasopharyngeal Swab; Nasopharyngeal(NP) swabs in vial transport medium  Result Value Ref Range Status   SARS Coronavirus 2 by RT PCR NEGATIVE NEGATIVE Final    Comment: (NOTE) SARS-CoV-2 target nucleic acids are NOT DETECTED.  The SARS-CoV-2 RNA is generally detectable in upper respiratory specimens during the acute phase of infection. The lowest concentration of SARS-CoV-2 viral copies this assay can detect is 138 copies/mL.  A negative result does not preclude SARS-Cov-2 infection and should not be used as the sole basis for treatment or other patient management decisions. A negative result may occur with  improper specimen collection/handling, submission of specimen other than nasopharyngeal swab, presence of viral mutation(s) within the areas targeted by this assay, and inadequate number of viral copies(<138 copies/mL). A negative result must be combined with clinical observations, patient history, and epidemiological information. The expected result is Negative.  Fact Sheet for Patients:  EntrepreneurPulse.com.au  Fact Sheet for Healthcare Providers:  IncredibleEmployment.be  This test is no t yet approved or cleared by the [place] FDA and  has been authorized for detection and/or diagnosis of SARS-CoV-2 by FDA under an Emergency Use Authorization (EUA). This EUA will remain  in effect (meaning this test can be used) for the duration of the COVID-19 declaration under Section 564(b)(1) of the Act, 21 U.S.C.section 360bbb-3(b)(1), unless the authorization is terminated  or revoked sooner.       Influenza A by PCR NEGATIVE NEGATIVE Final   Influenza B by PCR NEGATIVE NEGATIVE Final    Comment: (NOTE) The Xpert Xpress SARS-CoV-2/FLU/RSV plus assay  is intended as an aid in the diagnosis of influenza from Nasopharyngeal swab specimens and should not be used as a sole basis for treatment. Nasal washings and aspirates are unacceptable for Xpert Xpress SARS-CoV-2/FLU/RSV testing.  Fact Sheet for Patients: EntrepreneurPulse.com.au  Fact Sheet for Healthcare Providers: IncredibleEmployment.be  This test is not yet approved or cleared by the [place] FDA and has been authorized for detection and/or diagnosis of SARS-CoV-2 by FDA under an Emergency Use Authorization (EUA). This EUA will remain in effect (meaning this test can be used) for the duration of the COVID-19 declaration under Section 564(b)(1) of the Act, 21 U.S.C. section 360bbb-3(b)(1), unless the authorization is terminated or revoked.  Performed at [facility], [address] [place], [place] [address]          Radiology Studies: CT Abdomen Pelvis Wo Contrast  Result Date: 12/31/2020 CLINICAL DATA:  Abdominal pain, emesis, diverticulitis suspected EXAM: CT ABDOMEN AND PELVIS WITHOUT CONTRAST TECHNIQUE: Multidetector CT imaging of the abdomen and pelvis was performed following the standard protocol without IV contrast. COMPARISON:  12/17/2020 FINDINGS: Lower chest: No acute abnormality. Hepatobiliary: No solid liver abnormality is seen. No gallstones, gallbladder wall thickening, or biliary dilatation. Pancreas: Unremarkable. No pancreatic ductal dilatation or surrounding inflammatory changes. Spleen: Normal in size without significant abnormality. Adrenals/Urinary Tract: Adrenal glands are unremarkable. Kidneys are normal, without renal calculi, solid lesion, or hydronephrosis. Bladder is unremarkable. Stomach/Bowel: Stomach is within normal limits. Appendix appears normal. Status post sigmoid colon resection. There is there is redemonstrated relatively focal wall thickening and fat stranding about diverticula of the  remnant proximal sigmoid, similar to prior examination (series 2, image 60). Vascular/Lymphatic: Aortic atherosclerosis. No enlarged abdominal or pelvic lymph nodes. Reproductive: Status post hysterectomy. Other: No abdominal wall hernia or abnormality. No abdominopelvic ascites. Musculoskeletal: No acute or significant osseous findings. IMPRESSION: Status post sigmoid colon resection. There is redemonstrated relatively focal wall thickening and fat stranding about diverticula of the remnant proximal sigmoid, similar to prior examination. Findings are consistent with acute diverticulitis. No evidence of perforation or abscess. Aortic Atherosclerosis (ICD10-I70.0). Electronically Signed   By: Eddie Candle M.D.   On: 12/31/2020 17:54        Scheduled Meds:  amLODipine  5 mg Oral QHS   aspirin  81 mg Oral Daily   atorvastatin  80  mg Oral q1800   clopidogrel  75 mg Oral Daily   divalproex  1,000 mg Oral Daily   ezetimibe  10 mg Oral Daily   ezetimibe  10 mg Oral Once   heparin  5,000 Units Subcutaneous Q8H   isosorbide mononitrate  60 mg Oral Daily   mometasone-formoterol  2 puff Inhalation BID   pantoprazole  40 mg Oral BID AC   tiotropium  18 mcg Inhalation Daily   Continuous Infusions:  lactated ringers 75 mL/hr at 01/02/21 1032   piperacillin-tazobactam (ZOSYN)  IV 3.375 g (01/02/21 0458)   promethazine (PHENERGAN) injection (IM or IVPB) Stopped (01/01/21 0413)     LOS: 1 day    Time spent: 40 minutes    Irine Seal, MD [facility]   To contact the attending provider between 7A-7P or the covering provider during after hours 7P-7A, please log into the web site [email or web address] and access using universal [place] password for that web site. If you do not have the password, please call the hospital operator.  01/02/2021, 10:55 AM

## 2021-01-02 NOTE — TOC Initial Note (Signed)
Transition of Care [facility]) - Initial/Assessment Note    Patient Details  Name: Shawna Anderson MRN: [id number] Date of Birth: 12/10/1965  Transition of [facility]) CM/SW Contact:    Candie Chroman, LCSW Phone Number: 01/02/2021, 12:10 PM  Clinical Narrative:   Readmission prevention screen complete. CSW met with patient. No supports at bedside. CSW introduced role and explained that discharge planning would be discussed. Patient lives home alone. PCP is Orland Mustard, MD. Patient uses transportation through [facility] which she has to arrange 3 days prior to appointments. She typically gets her medications delivered through [facility] but if she has to fill something new or needs it immediately she gets it filled at [facility] on the corner of [address]. No issues obtaining medications. No DME use or home health prior to admission. No further concerns. CSW encouraged patient to contact CSW as needed. CSW will continue to follow patient for support and facilitate return home when stable.               Expected Discharge Plan: Home/Self Care Barriers to Discharge: Continued Medical Work up   Patient Goals and CMS Choice Patient states their goals for this hospitalization and ongoing recovery are:: To go home      Expected Discharge Plan and Services Expected Discharge Plan: Home/Self Care     Post Acute Care Choice: NA Living arrangements for the past 2 months: Single Family Home                                      Prior Living Arrangements/Services Living arrangements for the past 2 months: Single Family Home Lives with:: Self Patient language and need for interpreter reviewed:: Yes Do you feel safe going back to the place where you live?: Yes            Criminal Activity/Legal Involvement Pertinent to Current Situation/Hospitalization: No - Comment as needed  Activities of Daily Living Home Assistive Devices/Equipment:  Dentures (specify type) ADL Screening (condition at time of admission) Patient's cognitive ability adequate to safely complete daily activities?: Yes Is the patient deaf or have difficulty hearing?: No Does the patient have difficulty seeing, even when wearing glasses/contacts?: No Does the patient have difficulty concentrating, remembering, or making decisions?: No Patient able to express need for assistance with ADLs?: Yes Does the patient have difficulty dressing or bathing?: No Independently performs ADLs?: Yes (appropriate for developmental age) Does the patient have difficulty walking or climbing stairs?: Yes Weakness of Legs: Both Weakness of Arms/Hands: Both  Permission Sought/Granted                  Emotional Assessment Appearance:: Appears stated age Attitude/Demeanor/Rapport: Engaged, Gracious Affect (typically observed): Accepting, Appropriate, Calm, Pleasant Orientation: : Oriented to Self, Oriented to Place, Oriented to  Time, Oriented to Situation Alcohol / Substance Use: Not Applicable Psych Involvement: No (comment)  Admission diagnosis:  Diverticulitis [K57.92] AKI (acute kidney injury) ([place]) [N17.9] Acute diverticulitis [K57.92] Patient Active Problem List   Diagnosis Date Noted   Diverticulitis 01/01/2021   AKI (acute kidney injury) ([place]) 12/31/2020   Hypokalemia 12/31/2020   Transaminitis 12/31/2020   Acute diverticulitis 12/17/2020   Chronic pain syndrome 12/05/2020   Acute bronchitis with COPD ([place]) 11/28/2020   Cough 11/28/2020   Accelerating angina ([place]) 09/11/2020   Chronic nausea 03/26/2020   SUI (stress urinary incontinence, female) 03/26/2020  Bronchitis 03/26/2020   Continuous LLQ abdominal pain 03/01/2020   URI with cough and congestion 03/01/2020   Esophageal dysphagia 01/26/2020   Schatzki's ring of distal esophagus 01/26/2020   Ecchymosis 01/11/2020   Chronic pain of both knees 01/11/2020   Bruising 12/05/2019   Trichomonas  infection 12/05/2019   Elevated liver enzymes 11/15/2019   Prediabetes 11/15/2019   STD exposure 11/15/2019   Anemia 11/15/2019   Chronic bursitis of right shoulder 10/25/2019   Chest wall pain 09/05/2019   Poor dentition 08/18/2019   Thrombocytosis 08/18/2019   Leukocytosis 08/18/2019   Anal lesion 08/18/2019   Female pelvic pain 05/26/2019   Colitis 05/26/2019   HLD (hyperlipidemia) 12/27/2018   Vitamin D deficiency 12/24/2018   Bipolar disorder ([place]) 12/30/2017   OSA (obstructive sleep apnea) 12/30/2017   Constipation 12/30/2017   Coronary artery disease of native artery of native heart with stable angina pectoris ([place]) 12/30/2017   Gastroesophageal reflux disease 12/30/2017   Asthma 12/29/2017   COPD (chronic obstructive pulmonary disease) ([place]) 12/29/2017   Anxiety and depression 12/29/2017   Insomnia 12/29/2017   Chronic back pain 12/29/2017   Skin lesion of back 12/29/2017   CAD S/P CFX PCI 2010 04/15/2016   Essential hypertension 04/15/2016   Noncompliance with medication regimen 04/15/2016   Chest pain with moderate risk of acute coronary syndrome 04/12/2016   Abdominal pain 01/12/2015   Vomiting 01/12/2015   Nausea and vomiting 01/12/2015   PCP:  McLean-Scocuzza, Nino Glow, MD Pharmacy:   [facility] [place], [address] [place] [address] Phone: [phone] Fax: [phone]  [facility]  ([place], [address], [address] [place], [place] [address] [place] [address] Phone: [phone] Fax: [phone]     Social Determinants of Health (SDOH) Interventions    Readmission Risk Interventions Readmission Risk Prevention Plan 01/02/2021  Transportation Screening Complete  PCP or Specialist Appt within 3-5 Days Complete  Social Work Consult for [place] Planning/Counseling Complete  Palliative Care Screening  Not Applicable  Medication Review Press photographer) Complete  Some recent data might be hidden

## 2021-01-02 NOTE — Consult Note (Signed)
GI Inpatient Consult Note  Reason for Consult: Acute diverticulitis without complication, postprandial abdominal pain    Attending Requesting Consult: Dr. Irine Seal, MD  History of Present Illness: Shawna Anderson is a 55 y.o. female seen for evaluation of acute recurrent diverticulitis at the request of Dr. Irine Seal. Pt has a PMH of CAD s/p PCI 2010 on Plavix, HTN, asthma, COPD, OSA not on CPAP, Hx of bowel perforation 2/2 complicated diverticulitis s/p sigmoid colectomy. Pt was admitted at [facility] 06/06 - 12/19/20 for acute uncomplicated diverticulitis where she received 2 days of IV antibiotics and then was transitioned to PO antibiotics. She was sent home with Cipro and Flagyl and advised to follow-up with her primary gastroenterologist - Dr. Vicente Males in 1-week. She reports she was compliant with the antibiotics but doesn't remember how many times a day she was taking them. She can't recall if she ever returned to her baseline. She reports around 06/16 she started to have worsening LLQ abdominal pain along with postprandial nausea and vomiting. She presented back to the ED where she was found to have mild leukocytosis 14.9K, hypokalemia 2.9, acute kidney injury with creatinine 1.90 from baseline 0.6, AST 50, and ALT 104. Repeat CT scan showed acute uncomplicated diverticulitis. She was started on IV Zosyn. GI was consulted in context of recurrent diverticulitis and concerns over postprandial abdominal pain, nausea, and vomiting.   Patient seen and examined this afternoon resting in bed. She reports pain level is currently 7/10 and she is due for next dose of Dilaudid. She reports Dilaudid is the only thing that seems to help with her pain. She reports that abdominal pain seems to start as soon as food sits on her stomach. She endorses some postprandial epigastric and LLQ abdominal pain along with nausea and vomiting. Vomiting and nausea are non-intractable. She reports she had complicated  diverticulitis appx 10 years ago which required partial colectomy. She reports no other diverticulitis episodes up until this month. She does not eat a lot of nuts/seeds per her report but studies show this has no increased risk. She denies any constipation, changes in bowel habits, diarrhea, hematochezia, melena, fevers, or chills. She reports she has been diagnosed with gastroparesis in the outpatient setting after gastric emptying scan showed slightly delayed gastric emptying. She does not follow a strict gastroparesis diet. She reports she does smoke marijuana daily and this helps with anxiety symptoms.    Summary of GI Procedures:  Colonoscopy 07/2019 (Dr. Vicente Males) - 5 mm polyp in distal ascending colon with path showing mucosal ectasia which is benign and small-mouthed diverticulosis in descending colon  EGD 06/2019 (Dr. Vicente Males) - non-obstructing Schatzki's ring NOT dilated, gastritis  Gastric emptying scan 07/2020 - slightly delayed gastric emptying   Past Medical History:  Past Medical History:  Diagnosis Date   Anxiety    Asthma    Bipolar 1 disorder ([place])    Bipolar disorder ([place])    CAD (coronary artery disease)    s/p stent BMS OM Cx   Cervical herniated disc 04/12/2016   COPD (chronic obstructive pulmonary disease) ([place])    Depression    Diabetes mellitus without complication (HCC)    Diverticulitis    GERD (gastroesophageal reflux disease)    Glaucoma    History of blood transfusion    Hyperlipidemia    Hypertension    OSA (obstructive sleep apnea)    not using cpap    Plantar fasciitis    b/l feet s/p steroid shots w/o help  and left surgery w/o help    UTI (urinary tract infection)     Problem List: Patient Active Problem List   Diagnosis Date Noted   Diverticulitis 01/01/2021   AKI (acute kidney injury) ([place]) 12/31/2020   Hypokalemia 12/31/2020   Transaminitis 12/31/2020   Acute diverticulitis 12/17/2020   Chronic pain syndrome 12/05/2020   Acute bronchitis  with COPD ([place]) 11/28/2020   Cough 11/28/2020   Accelerating angina ([place]) 09/11/2020   Chronic nausea 03/26/2020   SUI (stress urinary incontinence, female) 03/26/2020   Bronchitis 03/26/2020   Continuous LLQ abdominal pain 03/01/2020   URI with cough and congestion 03/01/2020   Esophageal dysphagia 01/26/2020   Schatzki's ring of distal esophagus 01/26/2020   Ecchymosis 01/11/2020   Chronic pain of both knees 01/11/2020   Bruising 12/05/2019   Trichomonas infection 12/05/2019   Elevated liver enzymes 11/15/2019   Prediabetes 11/15/2019   STD exposure 11/15/2019   Anemia 11/15/2019   Chronic bursitis of right shoulder 10/25/2019   Chest wall pain 09/05/2019   Poor dentition 08/18/2019   Thrombocytosis 08/18/2019   Leukocytosis 08/18/2019   Anal lesion 08/18/2019   Female pelvic pain 05/26/2019   Colitis 05/26/2019   HLD (hyperlipidemia) 12/27/2018   Vitamin D deficiency 12/24/2018   Bipolar disorder ([place]) 12/30/2017   OSA (obstructive sleep apnea) 12/30/2017   Constipation 12/30/2017   Coronary artery disease of native artery of native heart with stable angina pectoris ([place]) 12/30/2017   Gastroesophageal reflux disease 12/30/2017   Asthma 12/29/2017   COPD (chronic obstructive pulmonary disease) ([place]) 12/29/2017   Anxiety and depression 12/29/2017   Insomnia 12/29/2017   Chronic back pain 12/29/2017   Skin lesion of back 12/29/2017   CAD S/P CFX PCI 2010 04/15/2016   Essential hypertension 04/15/2016   Noncompliance with medication regimen 04/15/2016   Chest pain with moderate risk of acute coronary syndrome 04/12/2016   Abdominal pain 01/12/2015   Vomiting 01/12/2015   Nausea and vomiting 01/12/2015    Past Surgical History: Past Surgical History:  Procedure Laterality Date   ABDOMINAL HYSTERECTOMY     ABDOMINAL SURGERY  1995   Bowel resection.   CARDIAC CATHETERIZATION N/A 04/14/2016   Procedure: Left Heart Cath and Coronary Angiography;  Surgeon: Burnell Blanks, MD;  Location: [facility];  Service: Cardiovascular;  Laterality: N/A;   CARDIAC SURGERY     COLONOSCOPY WITH PROPOFOL N/A 07/11/2019   Procedure: COLONOSCOPY WITH PROPOFOL;  Surgeon: Jonathon Bellows, MD;  Location: [facility] ENDOSCOPY;  Service: Gastroenterology;  Laterality: N/A;   COLONOSCOPY WITH PROPOFOL N/A 07/29/2019   Procedure: COLONOSCOPY WITH PROPOFOL;  Surgeon: Jonathon Bellows, MD;  Location: [facility] ENDOSCOPY;  Service: Gastroenterology;  Laterality: N/A;   CORONARY ANGIOPLASTY WITH STENT PLACEMENT  2010   Drug eluting stent   ESOPHAGOGASTRODUODENOSCOPY (EGD) WITH PROPOFOL N/A 07/11/2019   Procedure: ESOPHAGOGASTRODUODENOSCOPY (EGD) WITH PROPOFOL;  Surgeon: Jonathon Bellows, MD;  Location: [facility] ENDOSCOPY;  Service: Gastroenterology;  Laterality: N/A;   LEFT HEART CATH AND CORONARY ANGIOGRAPHY Left 09/11/2020   Procedure: LEFT HEART CATH AND CORONARY ANGIOGRAPHY;  Surgeon: Nelva Bush, MD;  Location: [facility];  Service: Cardiovascular;  Laterality: Left;   OVARIAN CYST REMOVAL      Allergies: Allergies  Allergen Reactions   Ativan [Lorazepam]     Pt states it makes her tongue do weird things    Augmentin [Amoxicillin-Pot Clavulanate]     Upset stomach    Latex Other (See Comments)    Patient stated that  she was told by her doctor that she is "allergic to" this   Tape Other (See Comments)    Patient stated that she was told by her doctor that she is "allergic to" this   Xifaxan [Rifaximin]     Pt believes this is contributing to her abdominal pain/discomfort   Drixoral Allergy Sinus [Dexbromphen-Pse-Apap Er] Rash    Home Medications: Medications Prior to Admission  Medication Sig Dispense Refill Last Dose   albuterol (VENTOLIN HFA) 108 (90 Base) MCG/ACT inhaler Inhale 1-2 puffs into the lungs every 6 (six) hours as needed for wheezing or shortness of breath. 54 g 3 12/31/2020 at unk   amLODipine (NORVASC) 10 MG tablet Take 1 tablet (10 mg total) by mouth at  bedtime. 90 tablet 3 12/30/2020 at unk   aspirin 81 MG chewable tablet Chew 81 mg by mouth daily.   Past Week at unk   atorvastatin (LIPITOR) 80 MG tablet Take 1 tablet (80 mg total) by mouth daily at 6 PM. 90 tablet 3 12/30/2020 at unk   Budeson-Glycopyrrol-Formoterol (BREZTRI AEROSPHERE) 160-9-4.8 MCG/ACT AERO Inhale 2 puffs into the lungs in the morning and at bedtime. Rinse 32.1 g 3 12/30/2020 at unk   clopidogrel (PLAVIX) 75 MG tablet Take 1 tablet (75 mg total) by mouth daily. 90 tablet 3 12/31/2020 at unk   divalproex (DEPAKOTE) 500 MG DR tablet TAKE 1 TABLET BY MOUTH  TWICE DAILY (Patient taking differently: Take 1,000 mg by mouth daily.) 180 tablet 3 12/31/2020 at unk   Evolocumab (REPATHA SURECLICK) 875 MG/ML SOAJ Inject 1 mL into the skin every 14 (fourteen) days. 6 mL 1    ezetimibe (ZETIA) 10 MG tablet TAKE 1 TABLET BY MOUTH  DAILY WITH LIPITOR 80 MG AT NIGHT 90 tablet 3 12/30/2020 at unk   isosorbide mononitrate (IMDUR) 60 MG 24 hr tablet Take 1 tablet (60 mg total) by mouth daily. 90 tablet 1 12/31/2020 at unk   methocarbamol (ROBAXIN) 500 MG tablet Take 1 tablet (500 mg total) by mouth 2 (two) times daily as needed for muscle spasms. 60 tablet 5 prn at prn   nitroGLYCERIN (NITROSTAT) 0.4 MG SL tablet DISSOLVE 1 TABLET UNDER  TONGUE EVERY 5 MINUTES AS  NEEDED FOR CHEST PAIN MAX  OF 3 TABLETS IN 15 MINUTES  . CALL 911 AFTER THIRD DOSE 75 tablet 4 prn at prn   pantoprazole (PROTONIX) 40 MG tablet TAKE 1 TABLET BY MOUTH  TWICE DAILY BEFORE MEALS (Patient taking differently: Take 40 mg by mouth 2 (two) times daily before a meal.) 180 tablet 3 12/31/2020 at unk   polyethylene glycol (MIRALAX / GLYCOLAX) 17 g packet Take 17 g by mouth daily. 30 packet 0 prn at prn   traZODone (DESYREL) 150 MG tablet Take 1 tablet (150 mg total) by mouth at bedtime as needed for sleep. 90 tablet 3 prn at prn   Vitamin D, Cholecalciferol, 25 MCG (1000 UT) CAPS Take 1 capsule by mouth daily.   12/31/2020 at unk    icosapent Ethyl (VASCEPA) 1 g capsule Take 2 capsules (2 g total) by mouth 2 (two) times daily. 360 capsule 3    Home medication reconciliation was completed with the patient.   Scheduled Inpatient Medications:    amLODipine  5 mg Oral QHS   aspirin  81 mg Oral Daily   atorvastatin  80 mg Oral q1800   clopidogrel  75 mg Oral Daily   divalproex  1,000 mg Oral Daily   ezetimibe  10  mg Oral Daily   heparin  5,000 Units Subcutaneous Q8H   isosorbide mononitrate  60 mg Oral Daily   mometasone-formoterol  2 puff Inhalation BID   pantoprazole  40 mg Oral BID AC   tiotropium  18 mcg Inhalation Daily    Continuous Inpatient Infusions:    lactated ringers 75 mL/hr at 01/02/21 1032   piperacillin-tazobactam (ZOSYN)  IV 3.375 g (01/02/21 1328)   promethazine (PHENERGAN) injection (IM or IVPB) Stopped (01/01/21 0413)    PRN Inpatient Medications:  albuterol, alum & mag hydroxide-simeth **AND** lidocaine, HYDROmorphone (DILAUDID) injection, methocarbamol, morphine injection, ondansetron (ZOFRAN) IV, promethazine (PHENERGAN) injection (IM or IVPB), traZODone  Family History: family history includes Alcohol abuse in her father; CAD in her brother and mother; Depression in her brother and mother; Diabetes in her brother; Heart disease in her brother, father, and mother; Hyperlipidemia in her brother and mother; Hypertension in her mother; Lupus in an other family member; Sickle cell anemia in an other family member.  The patient's family history is negative for inflammatory bowel disorders, GI malignancy, or solid organ transplantation.  Social History:   reports that she has been smoking cigarettes. She has a 30.00 pack-year smoking history. She has never used smokeless tobacco. She reports previous alcohol use. She reports current drug use. Frequency: 7.00 times per week. Drug: Marijuana. The patient denies ETOH, tobacco, or drug use.   Review of Systems: Constitutional: Weight is stable.   Eyes: No changes in vision. ENT: No oral lesions, sore throat.  GI: see HPI.  Heme/Lymph: No easy bruising.  CV: No chest pain.  GU: No hematuria.  Integumentary: No rashes.  Neuro: No headaches.  Psych: No depression/anxiety.  Endocrine: No heat/cold intolerance.  Allergic/Immunologic: No urticaria.  Resp: No cough, SOB.  Musculoskeletal: No joint swelling.    Physical Examination: BP 108/65 (BP Location: Right Arm)   Pulse 84   Temp 98.9 F (37.2 C) (Oral)   Resp 18   Ht 5\' 3"  (1.6 m)   Wt 87 kg   SpO2 97%   BMI 33.98 kg/m  Gen: NAD, alert and oriented x 4 HEENT: PEERLA, EOMI, Neck: supple, no JVD or thyromegaly Chest: CTA bilaterally, no wheezes, crackles, or other adventitious sounds CV: RRR, no m/g/c/r Abd: soft, NT, ND, +BS in all four quadrants; tender to deep palpation in LLQ, mild tenderness to deep palpation in suprapubic region, HSM, guarding, ridigity, or rebound tenderness Ext: no edema, well perfused with 2+ pulses, Skin: no rash or lesions noted Lymph: no LAD  Data: Lab Results  Component Value Date   WBC 9.6 01/02/2021   HGB 10.4 (L) 01/02/2021   HCT 30.9 (L) 01/02/2021   MCV 90.9 01/02/2021   PLT 340 01/02/2021   Recent Labs  Lab 12/31/20 1515 01/01/21 0422 01/02/21 0500  HGB 13.6 11.0* 10.4*   Lab Results  Component Value Date   NA 139 01/02/2021   K 3.7 01/02/2021   CL 102 01/02/2021   CO2 31 01/02/2021   BUN 11 01/02/2021   CREATININE 0.82 01/02/2021   Lab Results  Component Value Date   ALT 75 (H) 01/01/2021   AST 35 01/01/2021   ALKPHOS 60 01/01/2021   BILITOT 0.6 01/01/2021   No results for input(s): APTT, INR, PTT in the last 168 hours. Assessment/Plan:  55 y/o female with a PMH of CAD s/p PCI, HTN, asthma/COPD, OSA not on CPAP, s/p sigmoid colectomy 2/2 bowel perforation, marijuana abuse admitted to the [facility] hospital 2/2 acute  recurrent uncomplicated diverticulitis   Acute recurrent uncomplicated diverticulitis  Hx  of complicated diverticulitis s/p sigmoidectomy appx 10 years ago Gastroparesis - likely contributor to her postprandial epigastric abd pain, nausea, and vomiting symptoms Marijuana abuse - raises likelihood for hyperemesis cannabinoid syndrome Anemia - monitoring closely, no overt blood loss  Recommendations:  - Pt recently admitted 06/06 - 06/08 at [facility] for acute uncomplicated diverticulitis and treated with IV abx and supportive care. She is readmitted with ongoing acute uncomplicated diverticulitis.  - Appears to be improving with IV Zosyn and pain control - Leukocytosis trending down - Continue IV Zosyn - Attempt to wean down on narcotic pain medications - Full liquid diet for now - Patient would benefit from strict gastroparesis diet. Encouraged small, frequent meals during the day - Consider metoclopramide 5 mg QID for nausea/vomiting and to assist with gastric emptying - Appreciate general surgery input given ongoing issues with pain - Following along with you    Thank you for the consult. Please call with questions or concerns.  Reeves Forth [facility] [phone] (Cell)

## 2021-01-03 ENCOUNTER — Ambulatory Visit: Payer: Medicare Other | Admitting: Internal Medicine

## 2021-01-03 ENCOUNTER — Telehealth: Payer: Self-pay | Admitting: Pharmacist

## 2021-01-03 LAB — COMPREHENSIVE METABOLIC PANEL
ALT: 43 U/L (ref 0–44)
AST: 19 U/L (ref 15–41)
Albumin: 3.1 g/dL — ABNORMAL LOW (ref 3.5–5.0)
Alkaline Phosphatase: 61 U/L (ref 38–126)
Anion gap: 8 (ref 5–15)
BUN: <5 mg/dL — ABNORMAL LOW (ref 6–20)
CO2: 29 mmol/L (ref 22–32)
Calcium: 8.6 mg/dL — ABNORMAL LOW (ref 8.9–10.3)
Chloride: 103 mmol/L (ref 98–111)
Creatinine, Ser: 0.79 mg/dL (ref 0.44–1.00)
GFR, Estimated: >60 mL/min (ref >60)
Glucose, Bld: 112 mg/dL — ABNORMAL HIGH (ref 70–99)
Potassium: 3.7 mmol/L (ref 3.5–5.1)
Sodium: 140 mmol/L (ref 135–145)
Total Bilirubin: 0.4 mg/dL (ref 0.3–1.2)
Total Protein: 5.7 g/dL — ABNORMAL LOW (ref 6.5–8.1)

## 2021-01-03 LAB — MAGNESIUM: Magnesium: 1.9 mg/dL (ref 1.7–2.4)

## 2021-01-03 LAB — CBC WITH DIFFERENTIAL/PLATELET
Abs Immature Granulocytes: 0.2 10*3/uL — ABNORMAL HIGH (ref 0.00–0.07)
Basophils Absolute: 0.1 10*3/uL (ref 0.0–0.1)
Basophils Relative: 1 %
Eosinophils Absolute: 0.5 10*3/uL (ref 0.0–0.5)
Eosinophils Relative: 5 %
HCT: 30.9 % — ABNORMAL LOW (ref 36.0–46.0)
Hemoglobin: 10.4 g/dL — ABNORMAL LOW (ref 12.0–15.0)
Immature Granulocytes: 2 %
Lymphocytes Relative: 41 %
Lymphs Abs: 4.2 10*3/uL — ABNORMAL HIGH (ref 0.7–4.0)
MCH: 30.2 pg (ref 26.0–34.0)
MCHC: 33.7 g/dL (ref 30.0–36.0)
MCV: 89.8 fL (ref 80.0–100.0)
Monocytes Absolute: 0.8 10*3/uL (ref 0.1–1.0)
Monocytes Relative: 8 %
Neutro Abs: 4.4 10*3/uL (ref 1.7–7.7)
Neutrophils Relative %: 43 %
Platelets: 397 10*3/uL (ref 150–400)
RBC: 3.44 MIL/uL — ABNORMAL LOW (ref 3.87–5.11)
RDW: 13.7 % (ref 11.5–15.5)
Smear Review: NORMAL
WBC: 10.3 10*3/uL (ref 4.0–10.5)
nRBC: 0 % (ref 0.0–0.2)

## 2021-01-03 MED ORDER — AMLODIPINE BESYLATE 10 MG PO TABS
10.0000 mg | ORAL_TABLET | Freq: Every day | ORAL | Status: DC
  Administered 2021-01-03 – 2021-01-09 (×8): 10 mg via ORAL
  Filled 2021-01-03 (×8): qty 1

## 2021-01-03 MED ORDER — HYDRALAZINE HCL 20 MG/ML IJ SOLN
10.0000 mg | Freq: Four times a day (QID) | INTRAMUSCULAR | Status: DC | PRN
  Administered 2021-01-05 (×2): 10 mg via INTRAVENOUS
  Filled 2021-01-03 (×2): qty 1

## 2021-01-03 MED ORDER — FUROSEMIDE 10 MG/ML IJ SOLN
40.0000 mg | Freq: Once | INTRAMUSCULAR | Status: DC
  Filled 2021-01-03 (×2): qty 4

## 2021-01-03 MED ORDER — METOCLOPRAMIDE HCL 5 MG/ML IJ SOLN: 5.0000 mg | Freq: Four times a day (QID) | INTRAMUSCULAR | Status: DC | PRN

## 2021-01-03 NOTE — Telephone Encounter (Signed)
Patient called while admitted to hospital asking [place] to tell RN she needs her Repatha shot. Advised that Repatha is not on inpatient formulary and she would be ok if she was a little late taking. She has up to 7 days to give injection without needed to change her schedule.

## 2021-01-03 NOTE — Progress Notes (Signed)
[facility] Inpatient Progress Note  Subjective: Patient seen for f/u diverticulitis. Patient much improved but still having "pain episodes" intermittently   Objective: Vital signs in last 24 hours: Temp:  [98 F (36.7 C)-98.9 F (37.2 C)] 98.7 F (37.1 C) (06/23 1112) Pulse Rate:  [75-85] 85 (06/23 1112) Resp:  [16-18] 18 (06/23 1112) BP: (108-163)/(65-97) 163/94 (06/23 1112) SpO2:  [97 %-99 %] 98 % (06/23 1112) Blood pressure (!) 163/94, pulse 85, temperature 98.7 F (37.1 C), temperature source Oral, resp. rate 18, height 5\' 3"  (1.6 m), weight 87 kg, SpO2 98 %.    Intake/Output from previous day: 06/22 0701 - 06/23 0700 In: 2385.9 [P.O.:1320; I.V.:850.8; IV Piggyback:215.1] Out: -   Intake/Output this shift: Total I/O In: 840 [P.O.:840] Out: -    General appearance:  Alert, NAD Resp:  CTA Cardio:  RRR GI:  Soft, minimally tender in LLQ. Extremities:  No edema.   Lab Results: Results for orders placed or performed during the hospital encounter of 12/31/20 (from the past 24 hour(s))  Comprehensive metabolic panel     Status: Abnormal   Collection Time: 01/03/21  5:10 AM  Result Value Ref Range   Sodium 140 135 - 145 mmol/L   Potassium 3.7 3.5 - 5.1 mmol/L   Chloride 103 98 - 111 mmol/L   CO2 29 22 - 32 mmol/L   Glucose, Bld 112 (H) 70 - 99 mg/dL   BUN <5 (L) 6 - 20 mg/dL   Creatinine, Ser 0.79 0.44 - 1.00 mg/dL   Calcium 8.6 (L) 8.9 - 10.3 mg/dL   Total Protein 5.7 (L) 6.5 - 8.1 g/dL   Albumin 3.1 (L) 3.5 - 5.0 g/dL   AST 19 15 - 41 U/L   ALT 43 0 - 44 U/L   Alkaline Phosphatase 61 38 - 126 U/L   Total Bilirubin 0.4 0.3 - 1.2 mg/dL   GFR, Estimated >60 >60 mL/min   Anion gap 8 5 - 15  CBC with Differential/Platelet     Status: Abnormal   Collection Time: 01/03/21  5:10 AM  Result Value Ref Range   WBC 10.3 4.0 - 10.5 K/uL   RBC 3.44 (L) 3.87 - 5.11 MIL/uL   Hemoglobin 10.4 (L) 12.0 - 15.0 g/dL   HCT 30.9 (L) 36.0 - 46.0 %   MCV  89.8 80.0 - 100.0 fL   MCH 30.2 26.0 - 34.0 pg   MCHC 33.7 30.0 - 36.0 g/dL   RDW 13.7 11.5 - 15.5 %   Platelets 397 150 - 400 K/uL   nRBC 0.0 0.0 - 0.2 %   Neutrophils Relative % 43 %   Neutro Abs 4.4 1.7 - 7.7 K/uL   Lymphocytes Relative 41 %   Lymphs Abs 4.2 (H) 0.7 - 4.0 K/uL   Monocytes Relative 8 %   Monocytes Absolute 0.8 0.1 - 1.0 K/uL   Eosinophils Relative 5 %   Eosinophils Absolute 0.5 0.0 - 0.5 K/uL   Basophils Relative 1 %   Basophils Absolute 0.1 0.0 - 0.1 K/uL   WBC Morphology ATYPICAL LYMPH    Smear Review Normal platelet morphology    Immature Granulocytes 2 %   Abs Immature Granulocytes 0.20 (H) 0.00 - 0.07 K/uL   Rouleaux PRESENT   Magnesium     Status: None   Collection Time: 01/03/21  5:10 AM  Result Value Ref Range   Magnesium 1.9 1.7 - 2.4 mg/dL     Recent Labs    01/01/21 0422  01/02/21 0500 01/03/21 0510  WBC 13.1* 9.6 10.3  HGB 11.0* 10.4* 10.4*  HCT 31.5* 30.9* 30.9*  PLT 291 340 397   BMET Recent Labs    01/01/21 0422 01/02/21 0500 01/03/21 0510  NA 135 139 140  K 3.0* 3.7 3.7  CL 98 102 103  CO2 29 31 29   GLUCOSE 134* 124* 112*  BUN 18 11 <5*  CREATININE 1.42* 0.82 0.79  CALCIUM 8.2* 8.5* 8.6*   LFT Recent Labs    01/03/21 0510  PROT 5.7*  ALBUMIN 3.1*  AST 19  ALT 43  ALKPHOS 61  BILITOT 0.4   PT/INR No results for input(s): LABPROT, INR in the last 72 hours. Hepatitis Panel No results for input(s): HEPBSAG, HCVAB, HEPAIGM, HEPBIGM in the last 72 hours. C-Diff No results for input(s): CDIFFTOX in the last 72 hours. No results for input(s): CDIFFPCR in the last 72 hours.   Studies/Results: No results found.  Scheduled Inpatient Medications:    amLODipine  5 mg Oral QHS   aspirin  81 mg Oral Daily   atorvastatin  80 mg Oral q1800   clopidogrel  75 mg Oral Daily   divalproex  1,000 mg Oral Daily   ezetimibe  10 mg Oral Daily   heparin  5,000 Units Subcutaneous Q8H   isosorbide mononitrate  60 mg Oral Daily    mometasone-formoterol  2 puff Inhalation BID   pantoprazole  40 mg Oral BID AC   tiotropium  18 mcg Inhalation Daily    Continuous Inpatient Infusions:    piperacillin-tazobactam (ZOSYN)  IV 3.375 g (01/03/21 1236)   promethazine (PHENERGAN) injection (IM or IVPB) Stopped (01/01/21 0413)    PRN Inpatient Medications:  acetaminophen, albuterol, alum & mag hydroxide-simeth **AND** lidocaine, HYDROmorphone (DILAUDID) injection, methocarbamol, metoCLOPramide (REGLAN) injection, morphine injection, promethazine (PHENERGAN) injection (IM or IVPB), traZODone  Miscellaneous: N/A  Assessment:  Recurrent diverticulitis - Clinically improved. Bipolar disorder. Marijuana abuse. History of gastroparesis.  Plan:  May advance diet as tolerated. Patient should stop marijuana due to nausea likely with regular use. Switch to oral antibiotics at discharge. Total of 2 weeks therapy. Cipro and flagyl if not allergic. Will follow peripherally.  Barre Aydelott K. Alice Reichert, M.D. 01/03/2021, 3:58 PM

## 2021-01-03 NOTE — Progress Notes (Signed)
PROGRESS NOTE    Shawna Anderson  QIO:[id number] DOB: Apr 20, 1966 DOA: 12/31/2020 PCP: McLean-Scocuzza, Nino Glow, MD    Chief Complaint  Patient presents with   Abdominal Pain   Emesis    Brief Narrative:  Shawna Anderson is an 55 y.o. female with medical history significant for CAD s/p PCI, hypertension, asthma/COPD, OSA not on CPAP, history of esophageal dysphagia with Schatzki's ring, s/p sigmoid colon resection for bowel perforation who presents with persistent nausea, vomiting and abdominal pain.  Found to have hypokalemia and AKI and recurrent acute diverticulitis on CT abdomen and pelvis.       Assessment & Plan:   Principal Problem:   Acute diverticulitis Active Problems:   CAD S/P CFX PCI 2010   Bipolar disorder (HCC)   OSA (obstructive sleep apnea)   Gastroesophageal reflux disease   AKI (acute kidney injury) ([place])   Hypokalemia   Transaminitis   Diverticulitis   1 recurrent acute diverticulitis -Patient noted to have recently admitted from 6/6-12/19/2020 for acute diverticulitis and noted to have been discharged on ciprofloxacin and Flagyl. -Improving clinically with improvement with abdominal pain.  Some clinical improvement with abdominal pain. -Afebrile. -Leukocytosis is trended down. -Continue IV Zosyn -Continue full liquid diet for another 24 hours then transition to a soft diet tomorrow. -Mobilize. -Place on Reglan 5 mg IV every 6 hours as needed nausea, vomiting. -GI consulted and are following.  2.  Acute kidney injury -Secondary to prerenal azotemia. -Resolved with hydration.   -Saline lock IV fluids.  3.  Hypokalemia -Likely secondary to GI losses. -Repleted.  Potassium at 3.7.  Magnesium at 1.9.  4.  Mild transaminitis -Resolved with hydration.   -Saline lock IV fluids.   5.  History of coronary artery disease -Stable. -Continue aspirin, Plavix, Lipitor, Zetia, Imdur.  6.  Hypertension -Continue Imdur, Norvasc.    7.   GERD Continue PPI twice daily.   8.  Bipolar disorder -Stable.   -Continue trazodone, Depakote.    9.  Obesity  10.  OSA -Not on CPAP.   DVT prophylaxis: Heparin Code Status: Full Family Communication: Updated patient.  No family at bedside.  Disposition:   Status is: Inpatient  Remains inpatient appropriate because:IV treatments appropriate due to intensity of illness or inability to take PO  Dispo: The patient is from: Home              Anticipated d/c is to: Home              Patient currently is not medically stable to d/c.   Difficult to place patient No       Consultants:  Gastroenterology: Gerarda Gunther PA 01/02/2021  Procedures:  CT abdomen and pelvis 12/31/2020    Antimicrobials: IV Zosyn 12/31/2020>>>>   Subjective: Sitting up in bed.  Stated was able to tolerate full liquids this morning.  States abdominal pain improving.  Overall feeling better.  Asking whether diet could be advanced to a solid diet.  No nausea.  No emesis.   Objective: Vitals:   01/02/21 1616 01/02/21 1937 01/03/21 0536 01/03/21 0750  BP: 108/65 (!) 149/83 (!) 144/68 (!) 155/97  Pulse: 84 84 75 85  Resp: 18 16 18 18   Temp: 98.9 F (37.2 C) 98.3 F (36.8 C) 98.2 F (36.8 C) 98 F (36.7 C)  TempSrc: Oral Oral Oral Oral  SpO2: 97% 99% 97% 99%  Weight:      Height:        Intake/Output Summary (  Last 24 hours) at 01/03/2021 1016 Last data filed at 01/03/2021 0419 Gross per 24 hour  Intake 2025.88 ml  Output --  Net 2025.88 ml    Filed Weights   12/31/20 1517  Weight: 87 kg    Examination:  General exam: NAD Respiratory system: Lungs clear to auscultation bilaterally.  No wheezes, no crackles, no rhonchi.  Normal respiratory effort. Cardiovascular system: Regular rate rhythm no murmurs rubs or gallops.  No JVD.  No lower extremity edema.   Gastrointestinal system: Abdomen is soft, nondistended, positive bowel sounds, decreased tenderness to palpation in the  epigastrium and LLQ.  No rebound.  No guarding.   Central nervous system: Alert and oriented.  Moving extremities spontaneously.  No focal neurological deficits.   Extremities: Symmetric 5 x 5 power. Skin: No rashes, lesions or ulcers Psychiatry: Judgement and insight appear normal. Mood & affect appropriate.     Data Reviewed: I have personally reviewed following labs and imaging studies  CBC: Recent Labs  Lab 12/31/20 1515 01/01/21 0422 01/02/21 0500 01/03/21 0510  WBC 14.9* 13.1* 9.6 10.3  NEUTROABS  --   --   --  4.4  HGB 13.6 11.0* 10.4* 10.4*  HCT 39.0 31.5* 30.9* 30.9*  MCV 88.0 88.7 90.9 89.8  PLT 374 291 340 397     Basic Metabolic Panel: Recent Labs  Lab 12/31/20 1515 12/31/20 1757 01/01/21 0422 01/02/21 0500 01/03/21 0510  NA 137  --  135 139 140  K 2.9*  --  3.0* 3.7 3.7  CL 99  --  98 102 103  CO2 27  --  29 31 29   GLUCOSE 135*  --  134* 124* 112*  BUN 17  --  18 11 <5*  CREATININE 1.90*  --  1.42* 0.82 0.79  CALCIUM 8.9  --  8.2* 8.5* 8.6*  MG  --  2.4  --   --  1.9     GFR: Estimated Creatinine Clearance: 85 mL/min (by C-G formula based on SCr of 0.79 mg/dL).  Liver Function Tests: Recent Labs  Lab 12/31/20 1515 01/01/21 0422 01/03/21 0510  AST 50* 35 19  ALT 104* 75* 43  ALKPHOS 79 60 61  BILITOT 0.6 0.6 0.4  PROT 7.4 6.0* 5.7*  ALBUMIN 4.2 3.3* 3.1*     CBG: No results for input(s): GLUCAP in the last 168 hours.   Recent Results (from the past 240 hour(s))  CULTURE, BLOOD (ROUTINE X 2) w Reflex to ID Panel     Status: None (Preliminary result)   Collection Time: 12/31/20  7:19 PM   Specimen: BLOOD  Result Value Ref Range Status   Specimen Description BLOOD RIGHT ANTECUBITAL  Final   Special Requests   Final    BOTTLES DRAWN AEROBIC AND ANAEROBIC Blood Culture adequate volume   Culture   Final    NO GROWTH 3 DAYS Performed at [facility], [address] [place], [place] [address]    Report Status PENDING   Incomplete  CULTURE, BLOOD (ROUTINE X 2) w Reflex to ID Panel     Status: None (Preliminary result)   Collection Time: 12/31/20  7:25 PM   Specimen: BLOOD  Result Value Ref Range Status   Specimen Description BLOOD BLOOD RIGHT HAND  Final   Special Requests   Final    BOTTLES DRAWN AEROBIC AND ANAEROBIC Blood Culture adequate volume   Culture   Final    NO GROWTH 3 DAYS Performed at [facility], [address] [place], [place] [address]    Report Status PENDING  Incomplete  Resp Panel by RT-PCR (Flu A&B, Covid) Nasopharyngeal Swab     Status: None   Collection Time: 12/31/20  9:13 PM   Specimen: Nasopharyngeal Swab; Nasopharyngeal(NP) swabs in vial transport medium  Result Value Ref Range Status   SARS Coronavirus 2 by RT PCR NEGATIVE NEGATIVE Final    Comment: (NOTE) SARS-CoV-2 target nucleic acids are NOT DETECTED.  The SARS-CoV-2 RNA is generally detectable in upper respiratory specimens during the acute phase of infection. The lowest concentration of SARS-CoV-2 viral copies this assay can detect is 138 copies/mL. A negative result does not preclude SARS-Cov-2 infection and should not be used as the sole basis for treatment or other patient management decisions. A negative result may occur with  improper specimen collection/handling, submission of specimen other than nasopharyngeal swab, presence of viral mutation(s) within the areas targeted by this assay, and inadequate number of viral copies(<138 copies/mL). A negative result must be combined with clinical observations, patient history, and epidemiological information. The expected result is Negative.  Fact Sheet for Patients:  EntrepreneurPulse.com.au  Fact Sheet for Healthcare Providers:  IncredibleEmployment.be  This test is no t yet approved or cleared by the [place] FDA and  has been authorized for detection and/or diagnosis of SARS-CoV-2 by FDA under an  Emergency Use Authorization (EUA). This EUA will remain  in effect (meaning this test can be used) for the duration of the COVID-19 declaration under Section 564(b)(1) of the Act, 21 U.S.C.section 360bbb-3(b)(1), unless the authorization is terminated  or revoked sooner.       Influenza A by PCR NEGATIVE NEGATIVE Final   Influenza B by PCR NEGATIVE NEGATIVE Final    Comment: (NOTE) The Xpert Xpress SARS-CoV-2/FLU/RSV plus assay is intended as an aid in the diagnosis of influenza from Nasopharyngeal swab specimens and should not be used as a sole basis for treatment. Nasal washings and aspirates are unacceptable for Xpert Xpress SARS-CoV-2/FLU/RSV testing.  Fact Sheet for Patients: EntrepreneurPulse.com.au  Fact Sheet for Healthcare Providers: IncredibleEmployment.be  This test is not yet approved or cleared by the [place] FDA and has been authorized for detection and/or diagnosis of SARS-CoV-2 by FDA under an Emergency Use Authorization (EUA). This EUA will remain in effect (meaning this test can be used) for the duration of the COVID-19 declaration under Section 564(b)(1) of the Act, 21 U.S.C. section 360bbb-3(b)(1), unless the authorization is terminated or revoked.  Performed at [facility], [address] [place], [place] [address]           Radiology Studies: No results found.      Scheduled Meds:  amLODipine  5 mg Oral QHS   aspirin  81 mg Oral Daily   atorvastatin  80 mg Oral q1800   clopidogrel  75 mg Oral Daily   divalproex  1,000 mg Oral Daily   ezetimibe  10 mg Oral Daily   heparin  5,000 Units Subcutaneous Q8H   isosorbide mononitrate  60 mg Oral Daily   mometasone-formoterol  2 puff Inhalation BID   pantoprazole  40 mg Oral BID AC   tiotropium  18 mcg Inhalation Daily   Continuous Infusions:  piperacillin-tazobactam (ZOSYN)  IV 3.375 g (01/03/21 0529)   promethazine (PHENERGAN) injection  (IM or IVPB) Stopped (01/01/21 0413)     LOS: 2 days    Time spent: 35 minutes    Irine Seal, MD [facility]   To contact  the attending provider between 7A-7P or the covering provider during after hours 7P-7A, please log into the web site [email or web address] and access using universal [place] password for that web site. If you do not have the password, please call the hospital operator.  01/03/2021, 10:16 AM

## 2021-01-04 ENCOUNTER — Inpatient Hospital Stay: Payer: Medicare Other

## 2021-01-04 LAB — BASIC METABOLIC PANEL
Anion gap: 9 (ref 5–15)
BUN: <5 mg/dL — ABNORMAL LOW (ref 6–20)
CO2: 28 mmol/L (ref 22–32)
Calcium: 9.4 mg/dL (ref 8.9–10.3)
Chloride: 102 mmol/L (ref 98–111)
Creatinine, Ser: 0.79 mg/dL (ref 0.44–1.00)
GFR, Estimated: >60 mL/min (ref >60)
Glucose, Bld: 122 mg/dL — ABNORMAL HIGH (ref 70–99)
Potassium: 4 mmol/L (ref 3.5–5.1)
Sodium: 139 mmol/L (ref 135–145)

## 2021-01-04 LAB — CBC
HCT: 31.3 % — ABNORMAL LOW (ref 36.0–46.0)
Hemoglobin: 10.8 g/dL — ABNORMAL LOW (ref 12.0–15.0)
MCH: 30.8 pg (ref 26.0–34.0)
MCHC: 34.5 g/dL (ref 30.0–36.0)
MCV: 89.2 fL (ref 80.0–100.0)
Platelets: 478 10*3/uL — ABNORMAL HIGH (ref 150–400)
RBC: 3.51 MIL/uL — ABNORMAL LOW (ref 3.87–5.11)
RDW: 13.8 % (ref 11.5–15.5)
WBC: 10.2 10*3/uL (ref 4.0–10.5)
nRBC: 0 % (ref 0.0–0.2)

## 2021-01-04 MED ORDER — FLEET ENEMA 7-19 GM/118ML RE ENEM
1.0000 | ENEMA | Freq: Once | RECTAL | Status: AC
  Administered 2021-01-04: 1 via RECTAL

## 2021-01-04 MED ORDER — SIMETHICONE 80 MG PO CHEW
160.0000 mg | CHEWABLE_TABLET | Freq: Four times a day (QID) | ORAL | Status: DC
  Administered 2021-01-04 – 2021-01-09 (×19): 160 mg via ORAL
  Filled 2021-01-04 (×20): qty 2

## 2021-01-04 MED ORDER — ONDANSETRON HCL 4 MG/2ML IJ SOLN
4.0000 mg | Freq: Four times a day (QID) | INTRAMUSCULAR | Status: DC | PRN
  Administered 2021-01-04 – 2021-01-05 (×3): 4 mg via INTRAVENOUS
  Filled 2021-01-04 (×3): qty 2

## 2021-01-04 MED ORDER — METOCLOPRAMIDE HCL 5 MG PO TABS
5.0000 mg | ORAL_TABLET | Freq: Three times a day (TID) | ORAL | Status: DC
  Administered 2021-01-04: 5 mg via ORAL
  Filled 2021-01-04: qty 1

## 2021-01-04 MED ORDER — METOCLOPRAMIDE HCL 5 MG/ML IJ SOLN
10.0000 mg | Freq: Four times a day (QID) | INTRAMUSCULAR | Status: DC
  Administered 2021-01-04 – 2021-01-06 (×7): 10 mg via INTRAVENOUS
  Filled 2021-01-04 (×7): qty 2

## 2021-01-04 NOTE — Progress Notes (Signed)
GI Inpatient Follow-up Note  Subjective:  Patient seen in follow-up for acute recurrent uncomplicated diverticulitis and history of gastroparesis. No acute events overnight. Pt reports that she was able to eat breakfast today without any significant postprandial abdominal pain, nausea, or vomiting. However, she did develop some abdominal distention and generalized discomfort appx 30 minutes after lunch this afternoon (pork roast and vegetables). She has not had a good bowel movement in a few days.   Scheduled Inpatient Medications:   amLODipine  10 mg Oral Daily   aspirin  81 mg Oral Daily   atorvastatin  80 mg Oral q1800   clopidogrel  75 mg Oral Daily   divalproex  1,000 mg Oral Daily   ezetimibe  10 mg Oral Daily   furosemide  40 mg Intravenous Once   heparin  5,000 Units Subcutaneous Q8H   isosorbide mononitrate  60 mg Oral Daily   metoCLOPramide  5 mg Oral TID AC & HS   mometasone-formoterol  2 puff Inhalation BID   pantoprazole  40 mg Oral BID AC   simethicone  160 mg Oral QID   tiotropium  18 mcg Inhalation Daily    Continuous Inpatient Infusions:    piperacillin-tazobactam (ZOSYN)  IV 3.375 g (01/04/21 1314)   promethazine (PHENERGAN) injection (IM or IVPB) Stopped (01/01/21 0413)    PRN Inpatient Medications:  acetaminophen, albuterol, alum & mag hydroxide-simeth **AND** lidocaine, hydrALAZINE, HYDROmorphone (DILAUDID) injection, methocarbamol, morphine injection, ondansetron (ZOFRAN) IV, promethazine (PHENERGAN) injection (IM or IVPB), traZODone  Review of Systems: Constitutional: Weight is stable.  Eyes: No changes in vision. ENT: No oral lesions, sore throat.  GI: see HPI.  Heme/Lymph: No easy bruising.  CV: No chest pain.  GU: No hematuria.  Integumentary: No rashes.  Neuro: No headaches.  Psych: No depression/anxiety.  Endocrine: No heat/cold intolerance.  Allergic/Immunologic: No urticaria.  Resp: No cough, SOB.  Musculoskeletal: No joint swelling.     Physical Examination: BP 104/71   Pulse 67   Temp 98.2 F (36.8 C)   Resp 18   Ht 5\' 3"  (1.6 m)   Wt 87 kg   SpO2 97%   BMI 33.98 kg/m  Gen: NAD, alert and oriented x 4 HEENT: PEERLA, EOMI, Neck: supple, no JVD or thyromegaly Chest: CTA bilaterally, no wheezes, crackles, or other adventitious sounds CV: RRR, no m/g/c/r Abd: mild abdominal distention, hypoactive bowel sounds in all four quadrants, TTP diffusely, worse in LLQ, no HSM, guarding, ridigity, or rebound tenderness Ext: no edema, well perfused with 2+ pulses, Skin: no rash or lesions noted Lymph: no LAD  Data: Lab Results  Component Value Date   WBC 10.2 01/04/2021   HGB 10.8 (L) 01/04/2021   HCT 31.3 (L) 01/04/2021   MCV 89.2 01/04/2021   PLT 478 (H) 01/04/2021   Recent Labs  Lab 01/02/21 0500 01/03/21 0510 01/04/21 0638  HGB 10.4* 10.4* 10.8*   Lab Results  Component Value Date   NA 139 01/04/2021   K 4.0 01/04/2021   CL 102 01/04/2021   CO2 28 01/04/2021   BUN <5 (L) 01/04/2021   CREATININE 0.79 01/04/2021   Lab Results  Component Value Date   ALT 43 01/03/2021   AST 19 01/03/2021   ALKPHOS 61 01/03/2021   BILITOT 0.4 01/03/2021   No results for input(s): APTT, INR, PTT in the last 168 hours. Assessment/Plan:  55 y/o female with a PMH of CAD s/p PCI, HTN, asthma/COPD, OSA not on CPAP, s/p sigmoid colectomy 2/2 bowel perforation,  marijuana abuse admitted to the [facility] hospital 2/2 acute recurrent uncomplicated diverticulitis     Acute recurrent uncomplicated diverticulitis Hx of complicated diverticulitis s/p sigmoidectomy appx 10 years ago Gastroparesis - likely contributor to her postprandial epigastric abd pain, nausea, and vomiting symptoms Marijuana abuse - raises likelihood for hyperemesis cannabinoid syndrome Anemia - monitoring closely, no overt blood loss Abdominal distention - likely 2/2 #3 and potential constipation, ileus, pSBO, etc  Recommendations:  - Postprandial abdominal  pain and discomfort appear 2/2 gastroparesis in my clinical opinion. Her diverticulitis symptoms appear to be responding well to IV antibiotics and conservative treatment.  - Agree with abdominal x-ray today to rule out any air fluid levels or signs of ileus formation - Enema this afternoon to assist with constipation and evacuation of stool from her rectum - Initiate IV Reglan 10 mg QID to assist with increasing gastric motility which will also help with nausea symptoms - Gastroparesis diet should be encouraged  - Continue antibiotics for diverticulitis - If she continues to worsen clinically, low threshold for repeat CT scan - Dr. Haig Prophet will be available over the weekend if questions arise or GI is needed  Please call with questions or concerns.    Octavia Bruckner, PA-C [facility] [phone] (Cell)

## 2021-01-04 NOTE — Progress Notes (Addendum)
PROGRESS NOTE    Shawna Anderson  QBH:[id number] DOB: November 01, 1965 DOA: 12/31/2020 PCP: McLean-Scocuzza, Nino Glow, MD    Chief Complaint  Patient presents with   Abdominal Pain   Emesis    Brief Narrative:  Shawna Anderson is an 55 y.o. female with medical history significant for CAD s/p PCI, hypertension, asthma/COPD, OSA not on CPAP, history of esophageal dysphagia with Schatzki's ring, s/p sigmoid colon resection for bowel perforation who presents with persistent nausea, vomiting and abdominal pain.  Found to have hypokalemia and AKI and recurrent acute diverticulitis on CT abdomen and pelvis.       Assessment & Plan:   Principal Problem:   Acute diverticulitis Active Problems:   CAD S/P CFX PCI 2010   Bipolar disorder (HCC)   OSA (obstructive sleep apnea)   Gastroesophageal reflux disease   AKI (acute kidney injury) ([place])   Hypokalemia   Transaminitis   Diverticulitis   1 recurrent acute diverticulitis -Patient noted to have recently admitted from 6/6-12/19/2020 for acute diverticulitis and noted to have been discharged on ciprofloxacin and Flagyl. -Clinical improvement -Afebrile. -Leukocytosis trended down. -Tolerated soft diet this morning. -Continue IV Zosyn. -Change as needed Reglan to Reglan 5 mg p.o. before meals and at bedtime. -GI consulted and following.  2.  Acute kidney injury -Secondary to prerenal azotemia. -Resolved with hydration.   -Saline lock IV fluids.  3.  Hypokalemia -Likely secondary to GI losses. -Repleted. -Magnesium at 4.0.  4.  Mild transaminitis -Resolved with hydration.   -Saline lock IV fluids.   5.  History of coronary artery disease -Stable. -Continue aspirin, Plavix, Lipitor, Zetia, Imdur.   6.  Hypertension -Not on current regimen of Imdur, Norvasc.   7.  GERD Continue PPI twice daily.   8.  Bipolar disorder -Stable.   -Continue trazodone, Depakote.  9.  Obesity  10.  OSA -Not on CPAP.  Addendum: -Was  called by RN that patient requested to be seen by the physician. Martin Majestic and assessed patient patient complains of diffuse abdominal pain, abdominal distention, increased bloating and states that is what happens when she eats a solid meal. -Patient states has not had a decent bowel movement in several days. -Abdomen noted to be significantly distended from this morning with diffuse tenderness to palpation. -Check a KUB. -Fleets enema x1. -GI alerted about new changes with abdominal pain and distention.   DVT prophylaxis: Heparin Code Status: Full Family Communication: Updated patient.  No family at bedside.  Disposition:   Status is: Inpatient  Remains inpatient appropriate because:IV treatments appropriate due to intensity of illness or inability to take PO  Dispo: The patient is from: Home              Anticipated d/c is to: Home              Patient currently is not medically stable to d/c.   Difficult to place patient No       Consultants:  Gastroenterology: Gerarda Gunther PA 01/02/2021  Procedures:  CT abdomen and pelvis 12/31/2020    Antimicrobials: IV Zosyn 12/31/2020>>>>   Subjective: Patient sitting up in bed.  Denies any significant abdominal pain.  Feeling much better.  No nausea or emesis.  Tolerated diet this morning.  Patient states she is concerned that with continued oral intake will lead to abdominal bloating and pain.  Patient stated she underwent gastric emptying study and was told she had gastroparesis.    Objective: Vitals:   01/03/21 2013  01/04/21 0331 01/04/21 0429 01/04/21 0747  BP: (!) 169/92 (!) 173/93 (!) 165/82 124/79  Pulse: 89 83 76 70  Resp: 18 18  18   Temp: 98.5 F (36.9 C) 98.2 F (36.8 C)  97.9 F (36.6 C)  TempSrc:  Oral    SpO2: 97% 99%  99%  Weight:      Height:        Intake/Output Summary (Last 24 hours) at 01/04/2021 1038 Last data filed at 01/03/2021 1417 Gross per 24 hour  Intake 840 ml  Output --  Net 840 ml     Filed Weights   12/31/20 1517  Weight: 87 kg    Examination:  General exam: NAD Respiratory system: CTA B.  No wheezes, no crackles, no rhonchi.  Normal respiratory effort.  Speaking in full sentences.  Cardiovascular system: RRR no murmurs rubs or gallops.  No JVD.  No lower extremity edema. Gastrointestinal system: Abdomen is soft, nondistended, positive bowel sounds, significantly decreased tenderness to palpation epigastrium and left lower quadrant.  No rebound.  No guarding. Central nervous system: Alert and oriented.  Moving extremities spontaneously.  No focal neurological deficits.   Extremities: Symmetric 5 x 5 power. Skin: No rashes, lesions or ulcers Psychiatry: Judgement and insight appear normal. Mood & affect appropriate.     Data Reviewed: I have personally reviewed following labs and imaging studies  CBC: Recent Labs  Lab 12/31/20 1515 01/01/21 0422 01/02/21 0500 01/03/21 0510 01/04/21 0638  WBC 14.9* 13.1* 9.6 10.3 10.2  NEUTROABS  --   --   --  4.4  --   HGB 13.6 11.0* 10.4* 10.4* 10.8*  HCT 39.0 31.5* 30.9* 30.9* 31.3*  MCV 88.0 88.7 90.9 89.8 89.2  PLT 374 291 340 397 478*     Basic Metabolic Panel: Recent Labs  Lab 12/31/20 1515 12/31/20 1757 01/01/21 0422 01/02/21 0500 01/03/21 0510 01/04/21 0638  NA 137  --  135 139 140 139  K 2.9*  --  3.0* 3.7 3.7 4.0  CL 99  --  98 102 103 102  CO2 27  --  29 31 29 28   GLUCOSE 135*  --  134* 124* 112* 122*  BUN 17  --  18 11 <5* <5*  CREATININE 1.90*  --  1.42* 0.82 0.79 0.79  CALCIUM 8.9  --  8.2* 8.5* 8.6* 9.4  MG  --  2.4  --   --  1.9  --      GFR: Estimated Creatinine Clearance: 85 mL/min (by C-G formula based on SCr of 0.79 mg/dL).  Liver Function Tests: Recent Labs  Lab 12/31/20 1515 01/01/21 0422 01/03/21 0510  AST 50* 35 19  ALT 104* 75* 43  ALKPHOS 79 60 61  BILITOT 0.6 0.6 0.4  PROT 7.4 6.0* 5.7*  ALBUMIN 4.2 3.3* 3.1*     CBG: No results for input(s): GLUCAP in  the last 168 hours.   Recent Results (from the past 240 hour(s))  CULTURE, BLOOD (ROUTINE X 2) w Reflex to ID Panel     Status: None (Preliminary result)   Collection Time: 12/31/20  7:19 PM   Specimen: BLOOD  Result Value Ref Range Status   Specimen Description BLOOD RIGHT ANTECUBITAL  Final   Special Requests   Final    BOTTLES DRAWN AEROBIC AND ANAEROBIC Blood Culture adequate volume   Culture   Final    NO GROWTH 3 DAYS Performed at [facility], [address], [place], [place] [address]  Report Status PENDING  Incomplete  CULTURE, BLOOD (ROUTINE X 2) w Reflex to ID Panel     Status: None (Preliminary result)   Collection Time: 12/31/20  7:25 PM   Specimen: BLOOD  Result Value Ref Range Status   Specimen Description BLOOD BLOOD RIGHT HAND  Final   Special Requests   Final    BOTTLES DRAWN AEROBIC AND ANAEROBIC Blood Culture adequate volume   Culture   Final    NO GROWTH 3 DAYS Performed at [facility], [address] [place], [place] [address]    Report Status PENDING  Incomplete  Resp Panel by RT-PCR (Flu A&B, Covid) Nasopharyngeal Swab     Status: None   Collection Time: 12/31/20  9:13 PM   Specimen: Nasopharyngeal Swab; Nasopharyngeal(NP) swabs in vial transport medium  Result Value Ref Range Status   SARS Coronavirus 2 by RT PCR NEGATIVE NEGATIVE Final    Comment: (NOTE) SARS-CoV-2 target nucleic acids are NOT DETECTED.  The SARS-CoV-2 RNA is generally detectable in upper respiratory specimens during the acute phase of infection. The lowest concentration of SARS-CoV-2 viral copies this assay can detect is 138 copies/mL. A negative result does not preclude SARS-Cov-2 infection and should not be used as the sole basis for treatment or other patient management decisions. A negative result may occur with  improper specimen collection/handling, submission of specimen other than nasopharyngeal swab, presence of viral mutation(s) within the areas  targeted by this assay, and inadequate number of viral copies(<138 copies/mL). A negative result must be combined with clinical observations, patient history, and epidemiological information. The expected result is Negative.  Fact Sheet for Patients:  EntrepreneurPulse.com.au  Fact Sheet for Healthcare Providers:  IncredibleEmployment.be  This test is no t yet approved or cleared by the [place] FDA and  has been authorized for detection and/or diagnosis of SARS-CoV-2 by FDA under an Emergency Use Authorization (EUA). This EUA will remain  in effect (meaning this test can be used) for the duration of the COVID-19 declaration under Section 564(b)(1) of the Act, 21 U.S.C.section 360bbb-3(b)(1), unless the authorization is terminated  or revoked sooner.       Influenza A by PCR NEGATIVE NEGATIVE Final   Influenza B by PCR NEGATIVE NEGATIVE Final    Comment: (NOTE) The Xpert Xpress SARS-CoV-2/FLU/RSV plus assay is intended as an aid in the diagnosis of influenza from Nasopharyngeal swab specimens and should not be used as a sole basis for treatment. Nasal washings and aspirates are unacceptable for Xpert Xpress SARS-CoV-2/FLU/RSV testing.  Fact Sheet for Patients: EntrepreneurPulse.com.au  Fact Sheet for Healthcare Providers: IncredibleEmployment.be  This test is not yet approved or cleared by the [place] FDA and has been authorized for detection and/or diagnosis of SARS-CoV-2 by FDA under an Emergency Use Authorization (EUA). This EUA will remain in effect (meaning this test can be used) for the duration of the COVID-19 declaration under Section 564(b)(1) of the Act, 21 U.S.C. section 360bbb-3(b)(1), unless the authorization is terminated or revoked.  Performed at [facility], [address], [place], [place] [address]           Radiology Studies: No results  found.      Scheduled Meds:  amLODipine  10 mg Oral Daily   aspirin  81 mg Oral Daily   atorvastatin  80 mg Oral q1800   clopidogrel  75 mg Oral Daily   divalproex  1,000 mg Oral Daily   ezetimibe  10 mg Oral Daily  furosemide  40 mg Intravenous Once   heparin  5,000 Units Subcutaneous Q8H   isosorbide mononitrate  60 mg Oral Daily   mometasone-formoterol  2 puff Inhalation BID   pantoprazole  40 mg Oral BID AC   tiotropium  18 mcg Inhalation Daily   Continuous Infusions:  piperacillin-tazobactam (ZOSYN)  IV 3.375 g (01/04/21 0517)   promethazine (PHENERGAN) injection (IM or IVPB) Stopped (01/01/21 0413)     LOS: 3 days    Time spent: 40 minutes    Irine Seal, MD [facility]   To contact the attending provider between 7A-7P or the covering provider during after hours 7P-7A, please log into the web site [email or web address] and access using universal [place] password for that web site. If you do not have the password, please call the hospital operator.  01/04/2021, 10:38 AM

## 2021-01-04 NOTE — Care Management Important Message (Signed)
Important Message  Patient Details  Name: Shawna Anderson MRN: [id number] Date of Birth: Mar 25, 1966   Medicare Important Message Given:  Yes     [name] 01/04/2021, 11:15 AM

## 2021-01-04 NOTE — Progress Notes (Signed)
Mobility Specialist - Progress Note   01/04/21 1200  Mobility  Activity Ambulated in room  Level of Assistance Independent  Assistive Device None  Distance Ambulated (ft) 75 ft  Mobility Ambulated independently in room  Mobility Response Tolerated well  Mobility performed by Mobility specialist  $Mobility charge 1 Mobility    Post-mobility: 103 HR, 97% SpO2   Pt finishing lunch upon arrival. Pt independent in transfers and ambulation without AD. No LOB. Pt stated that she has difficulty tolerating OOB activity for more than 30 minutes at a time d/t plantar fascitis. Voiced abdominal discomfort, feeling full and bloated upon arrival. Pt reports feeling SOB with activity, "I'm SOB all the time, that's nothing new". Pt requesting meds for abdominal relief once returned to bed. RN notified.    Shawna Anderson Mobility Specialist 01/04/21, 12:41 PM

## 2021-01-05 DIAGNOSIS — K3184 Gastroparesis: Secondary | ICD-10-CM

## 2021-01-05 DIAGNOSIS — K5900 Constipation, unspecified: Secondary | ICD-10-CM

## 2021-01-05 LAB — CBC WITH DIFFERENTIAL/PLATELET
Abs Immature Granulocytes: 0.71 10*3/uL — ABNORMAL HIGH (ref 0.00–0.07)
Basophils Absolute: 0.2 10*3/uL — ABNORMAL HIGH (ref 0.0–0.1)
Basophils Relative: 1 %
Eosinophils Absolute: 0.4 10*3/uL (ref 0.0–0.5)
Eosinophils Relative: 3 %
HCT: 31.8 % — ABNORMAL LOW (ref 36.0–46.0)
Hemoglobin: 10.7 g/dL — ABNORMAL LOW (ref 12.0–15.0)
Immature Granulocytes: 5 %
Lymphocytes Relative: 28 %
Lymphs Abs: 3.8 10*3/uL (ref 0.7–4.0)
MCH: 30.7 pg (ref 26.0–34.0)
MCHC: 33.6 g/dL (ref 30.0–36.0)
MCV: 91.4 fL (ref 80.0–100.0)
Monocytes Absolute: 1 10*3/uL (ref 0.1–1.0)
Monocytes Relative: 7 %
Neutro Abs: 7.3 10*3/uL (ref 1.7–7.7)
Neutrophils Relative %: 56 %
Platelets: 533 10*3/uL — ABNORMAL HIGH (ref 150–400)
RBC: 3.48 MIL/uL — ABNORMAL LOW (ref 3.87–5.11)
RDW: 13.8 % (ref 11.5–15.5)
WBC: 13.4 10*3/uL — ABNORMAL HIGH (ref 4.0–10.5)
nRBC: 0 % (ref 0.0–0.2)

## 2021-01-05 LAB — BASIC METABOLIC PANEL
Anion gap: 8 (ref 5–15)
BUN: 7 mg/dL (ref 6–20)
CO2: 27 mmol/L (ref 22–32)
Calcium: 8.9 mg/dL (ref 8.9–10.3)
Chloride: 105 mmol/L (ref 98–111)
Creatinine, Ser: 0.75 mg/dL (ref 0.44–1.00)
GFR, Estimated: >60 mL/min (ref >60)
Glucose, Bld: 124 mg/dL — ABNORMAL HIGH (ref 70–99)
Potassium: 4.2 mmol/L (ref 3.5–5.1)
Sodium: 140 mmol/L (ref 135–145)

## 2021-01-05 LAB — CULTURE, BLOOD (ROUTINE X 2)
Culture: NO GROWTH
Culture: NO GROWTH
Special Requests: ADEQUATE
Special Requests: ADEQUATE

## 2021-01-05 LAB — MAGNESIUM: Magnesium: 2 mg/dL (ref 1.7–2.4)

## 2021-01-05 MED ORDER — SENNOSIDES-DOCUSATE SODIUM 8.6-50 MG PO TABS
1.0000 | ORAL_TABLET | Freq: Two times a day (BID) | ORAL | Status: DC
  Administered 2021-01-05 – 2021-01-09 (×8): 1 via ORAL
  Filled 2021-01-05 (×8): qty 1

## 2021-01-05 MED ORDER — HYDROCORTISONE ACETATE 25 MG RE SUPP
25.0000 mg | Freq: Two times a day (BID) | RECTAL | Status: DC
  Administered 2021-01-05 – 2021-01-09 (×7): 25 mg via RECTAL
  Filled 2021-01-05 (×9): qty 1

## 2021-01-05 MED ORDER — MAGNESIUM CITRATE PO SOLN
1.0000 | Freq: Once | ORAL | Status: DC
  Filled 2021-01-05: qty 296

## 2021-01-05 MED ORDER — TRAMADOL HCL 50 MG PO TABS
100.0000 mg | ORAL_TABLET | Freq: Four times a day (QID) | ORAL | Status: DC | PRN
  Administered 2021-01-06 – 2021-01-09 (×6): 100 mg via ORAL
  Filled 2021-01-05 (×8): qty 2

## 2021-01-05 MED ORDER — HYDROMORPHONE HCL 1 MG/ML IJ SOLN
0.5000 mg | Freq: Four times a day (QID) | INTRAMUSCULAR | Status: DC | PRN
  Administered 2021-01-05 – 2021-01-08 (×6): 0.5 mg via INTRAVENOUS
  Filled 2021-01-05 (×6): qty 0.5

## 2021-01-05 MED ORDER — KETOROLAC TROMETHAMINE 30 MG/ML IJ SOLN
30.0000 mg | Freq: Once | INTRAMUSCULAR | Status: AC
  Administered 2021-01-05: 30 mg via INTRAVENOUS
  Filled 2021-01-05: qty 1

## 2021-01-05 MED ORDER — POLYETHYLENE GLYCOL 3350 17 G PO PACK
17.0000 g | PACK | Freq: Two times a day (BID) | ORAL | Status: DC
  Administered 2021-01-05 – 2021-01-09 (×8): 17 g via ORAL
  Filled 2021-01-05 (×8): qty 1

## 2021-01-05 MED ORDER — SORBITOL 70 % SOLN
960.0000 mL | TOPICAL_OIL | Freq: Once | ORAL | Status: AC
  Administered 2021-01-05: 960 mL via RECTAL
  Filled 2021-01-05: qty 473

## 2021-01-05 MED ORDER — ENOXAPARIN SODIUM 40 MG/0.4ML IJ SOSY
40.0000 mg | PREFILLED_SYRINGE | INTRAMUSCULAR | Status: DC
  Administered 2021-01-07 – 2021-01-08 (×2): 40 mg via SUBCUTANEOUS
  Filled 2021-01-05 (×4): qty 0.4

## 2021-01-05 NOTE — Progress Notes (Signed)
GI Inpatient Follow-up Note  Subjective:  Patient seen and doing well overall. Denies any significant GI symptoms as she had a bowel movement.  Scheduled Inpatient Medications:   amLODipine  10 mg Oral Daily   aspirin  81 mg Oral Daily   atorvastatin  80 mg Oral q1800   clopidogrel  75 mg Oral Daily   divalproex  1,000 mg Oral Daily   ezetimibe  10 mg Oral Daily   furosemide  40 mg Intravenous Once   heparin  5,000 Units Subcutaneous Q8H   isosorbide mononitrate  60 mg Oral Daily   metoCLOPramide (REGLAN) injection  10 mg Intravenous Q6H   mometasone-formoterol  2 puff Inhalation BID   pantoprazole  40 mg Oral BID AC   simethicone  160 mg Oral QID   tiotropium  18 mcg Inhalation Daily    Continuous Inpatient Infusions:    piperacillin-tazobactam (ZOSYN)  IV 3.375 g (01/05/21 1229)   promethazine (PHENERGAN) injection (IM or IVPB) Stopped (01/01/21 0413)    PRN Inpatient Medications:  acetaminophen, albuterol, alum & mag hydroxide-simeth **AND** lidocaine, hydrALAZINE, HYDROmorphone (DILAUDID) injection, methocarbamol, morphine injection, ondansetron (ZOFRAN) IV, promethazine (PHENERGAN) injection (IM or IVPB), traZODone  Review of Systems:  Review of Systems  Constitutional:  Negative for chills and fever.  Respiratory:  Negative for cough.   Cardiovascular:  Negative for chest pain.  Gastrointestinal:  Positive for constipation. Negative for abdominal pain, blood in stool, diarrhea, melena, nausea and vomiting.  Musculoskeletal:  Negative for joint pain.  Skin:  Negative for rash.  Neurological:  Negative for focal weakness.  Psychiatric/Behavioral:  Negative for substance abuse.   All other systems reviewed and are negative.    Physical Examination: BP (!) 162/79   Pulse 94   Temp 98.3 F (36.8 C) (Oral)   Resp 19   Ht 5\' 3"  (1.6 m)   Wt 87 kg   SpO2 99%   BMI 33.98 kg/m  Gen: NAD, alert and oriented x 4 HEENT: PEERLA, EOMI, Neck: supple, no JVD or  thyromegaly Chest: No respiratory distress CV: RRR Abd: soft but slightly distended Skin: no rash or lesions noted Lymph: no LAD  Data: Lab Results  Component Value Date   WBC 13.4 (H) 01/05/2021   HGB 10.7 (L) 01/05/2021   HCT 31.8 (L) 01/05/2021   MCV 91.4 01/05/2021   PLT 533 (H) 01/05/2021   Recent Labs  Lab 01/03/21 0510 01/04/21 0638 01/05/21 0825  HGB 10.4* 10.8* 10.7*   Lab Results  Component Value Date   NA 140 01/05/2021   K 4.2 01/05/2021   CL 105 01/05/2021   CO2 27 01/05/2021   BUN 7 01/05/2021   CREATININE 0.75 01/05/2021   Lab Results  Component Value Date   ALT 43 01/03/2021   AST 19 01/03/2021   ALKPHOS 61 01/03/2021   BILITOT 0.4 01/03/2021   No results for input(s): APTT, INR, PTT in the last 168 hours. Assessment/Plan: Shawna Anderson is a 55 y.o. lady who appears to be improving from diverticulitis  Recommendations:  - continue treatment plan with meds to help with her bowel movements - can likely discontinue reglan as she is having bowel movement which will help more than anything - needs aggressive bowel regiment as an outpatient (metamucil and miralax BID) - likely getting close to discharge  Please call with any questions or concerns  Raylene Miyamoto MD, MPH [place]

## 2021-01-05 NOTE — Progress Notes (Signed)
PROGRESS NOTE    Shawna Anderson  MHD:[id number] DOB: 12-26-1965 DOA: 12/31/2020 PCP: McLean-Scocuzza, Nino Glow, MD    Chief Complaint  Patient presents with   Abdominal Pain   Emesis    Brief Narrative:  Shawna Anderson is an 55 y.o. female with medical history significant for CAD s/p PCI, hypertension, asthma/COPD, OSA not on CPAP, history of esophageal dysphagia with Schatzki's ring, s/p sigmoid colon resection for bowel perforation who presents with persistent nausea, vomiting and abdominal pain.  Found to have hypokalemia and AKI and recurrent acute diverticulitis on CT abdomen and pelvis.       Assessment & Plan:   Principal Problem:   Acute diverticulitis Active Problems:   CAD S/P CFX PCI 2010   Bipolar disorder (HCC)   OSA (obstructive sleep apnea)   Gastroesophageal reflux disease   AKI (acute kidney injury) ([place])   Hypokalemia   Transaminitis   Diverticulitis   1 recurrent acute diverticulitis -Patient noted to have recently admitted from 6/6-12/19/2020 for acute diverticulitis and noted to have been discharged on ciprofloxacin and Flagyl. -Clinical improvement -Afebrile. -Leukocytosis trended down but fluctuating.   -Patient currently afebrile.   -Tolerating soft diet this morning.   -Continue IV Zosyn, scheduled IV Reglan.   -GI consulted and following.   2.  Acute kidney injury -Secondary to prerenal azotemia.   -Resolved with hydration.   -Saline lock IV fluids.   3.  Hypokalemia -Likely secondary to GI losses. -Repleted. -Magnesium at 2.0.  4.  Mild transaminitis -Resolved with hydration.   -Saline lock IV fluids.   5.  History of coronary artery disease -Stable. -Continue aspirin, Plavix, Lipitor, Zetia, Imdur.  6.  Hypertension -Continue Imdur, Norvasc.   7.  GERD Continue PPI twice daily.  8.  Bipolar disorder -Stable.   -Continue Depakote, trazodone.   9.  Obesity  10.  OSA -Not on CPAP.  11.  Constipation -Patient  noted yesterday afternoon with complaints of significant abdominal pain, abdominal distention after eating solid meals. -KUB done negative for ileus or small bowel obstruction however did note moderate stool. -Fleets enema ordered with some clinical improvement however no significant results from constipation. -Smog enema. -We will give a dose of magnesium citrate p.o. x1. -Place on Senokot-S twice daily. -Place on MiraLAX twice daily.  12.  Minimal- mild gastroparesis -Patient underwent gastric emptying study (07/20/2020) with slightly delayed late phase gastric emptying at 3 and 4 hours. -Patient with bloating, abdominal distention after solid food intake yesterday. -Patient started on IV Reglan per GI recommendations which we will continue for now. -Outpatient follow-up with GI.    DVT prophylaxis: Heparin>>>> Lovenox Code Status: Full Family Communication: Updated patient.  No family at bedside.  Disposition:   Status is: Inpatient  Remains inpatient appropriate because:IV treatments appropriate due to intensity of illness or inability to take PO  Dispo: The patient is from: Home              Anticipated d/c is to: Home              Patient currently is not medically stable to d/c.   Difficult to place patient No       Consultants:  Gastroenterology: Gerarda Gunther ,[place] 01/02/2021  Procedures:  CT abdomen and pelvis 12/31/2020    Antimicrobials: IV Zosyn 12/31/2020>>>>   Subjective: States some improvement with abdominal distention and abdominal pain after fleets enema yesterday.  Stated had at some liquid stool however no significant bowel movement.  Tolerated breakfast today.  No chest pain.  No shortness of breath.  No abdominal pain.   Objective: Vitals:   01/04/21 1956 01/05/21 0006 01/05/21 0445 01/05/21 0729  BP: (!) 144/80 110/82 (!) 170/94 122/75  Pulse: 95 78 84 72  Resp: 20 18 20 20   Temp: 98.6 F (37 C) 98.7 F (37.1 C) 97.9 F (36.6 C) 98.3 F  (36.8 C)  TempSrc: Oral Oral Oral Oral  SpO2: 99% 98% 100% 95%  Weight:      Height:        Intake/Output Summary (Last 24 hours) at 01/05/2021 1102 Last data filed at 01/05/2021 1037 Gross per 24 hour  Intake 958 ml  Output --  Net 958 ml    Filed Weights   12/31/20 1517  Weight: 87 kg    Examination:  General exam: : NAD Respiratory system: CTA B anterior lung fields.  No wheezes, no rhonchi.  Speaking in full sentences.  Normal respiratory effort. Cardiovascular system: Regular rate and rhythm no murmurs rubs or gallops.  No JVD.  No lower extremity edema.  Gastrointestinal system: Abdomen soft, nontender, nondistended, positive bowel sounds.  No rebound.  No guarding. Central nervous system: Alert and oriented. No focal neurological deficits. Extremities: Symmetric 5 x 5 power. Skin: No rashes, lesions or ulcers Psychiatry: Judgement and insight appear normal. Mood & affect appropriate.  Data Reviewed: I have personally reviewed following labs and imaging studies  CBC: Recent Labs  Lab 01/01/21 0422 01/02/21 0500 01/03/21 0510 01/04/21 0638 01/05/21 0825  WBC 13.1* 9.6 10.3 10.2 13.4*  NEUTROABS  --   --  4.4  --  7.3  HGB 11.0* 10.4* 10.4* 10.8* 10.7*  HCT 31.5* 30.9* 30.9* 31.3* 31.8*  MCV 88.7 90.9 89.8 89.2 91.4  PLT 291 340 397 478* 533*     Basic Metabolic Panel: Recent Labs  Lab 12/31/20 1757 01/01/21 0422 01/02/21 0500 01/03/21 0510 01/04/21 0638 01/05/21 0825  NA  --  135 139 140 139 140  K  --  3.0* 3.7 3.7 4.0 4.2  CL  --  98 102 103 102 105  CO2  --  29 31 29 28 27   GLUCOSE  --  134* 124* 112* 122* 124*  BUN  --  18 11 <5* <5* 7  CREATININE  --  1.42* 0.82 0.79 0.79 0.75  CALCIUM  --  8.2* 8.5* 8.6* 9.4 8.9  MG 2.4  --   --  1.9  --  2.0     GFR: Estimated Creatinine Clearance: 85 mL/min (by C-G formula based on SCr of 0.75 mg/dL).  Liver Function Tests: Recent Labs  Lab 12/31/20 1515 01/01/21 0422 01/03/21 0510  AST 50*  35 19  ALT 104* 75* 43  ALKPHOS 79 60 61  BILITOT 0.6 0.6 0.4  PROT 7.4 6.0* 5.7*  ALBUMIN 4.2 3.3* 3.1*     CBG: No results for input(s): GLUCAP in the last 168 hours.   Recent Results (from the past 240 hour(s))  CULTURE, BLOOD (ROUTINE X 2) w Reflex to ID Panel     Status: None   Collection Time: 12/31/20  7:19 PM   Specimen: BLOOD  Result Value Ref Range Status   Specimen Description BLOOD RIGHT ANTECUBITAL  Final   Special Requests   Final    BOTTLES DRAWN AEROBIC AND ANAEROBIC Blood Culture adequate volume   Culture   Final    NO GROWTH 5 DAYS Performed at [facility], [address] [place], [place] [address]    Report Status 01/05/2021 FINAL  Final  CULTURE, BLOOD (ROUTINE X 2) w Reflex to ID Panel     Status: None   Collection Time: 12/31/20  7:25 PM   Specimen: BLOOD  Result Value Ref Range Status   Specimen Description BLOOD BLOOD RIGHT HAND  Final   Special Requests   Final    BOTTLES DRAWN AEROBIC AND ANAEROBIC Blood Culture adequate volume   Culture   Final    NO GROWTH 5 DAYS Performed at [facility], [address] [place], [place] [address]    Report Status 01/05/2021 FINAL  Final  Resp Panel by RT-PCR (Flu A&B, Covid) Nasopharyngeal Swab     Status: None   Collection Time: 12/31/20  9:13 PM   Specimen: Nasopharyngeal Swab; Nasopharyngeal(NP) swabs in vial transport medium  Result Value Ref Range Status   SARS Coronavirus 2 by RT PCR NEGATIVE NEGATIVE Final    Comment: (NOTE) SARS-CoV-2 target nucleic acids are NOT DETECTED.  The SARS-CoV-2 RNA is generally detectable in upper respiratory specimens during the acute phase of infection. The lowest concentration of SARS-CoV-2 viral copies this assay can detect is 138 copies/mL. A negative result does not preclude SARS-Cov-2 infection and should not be used as the sole basis for treatment or other patient management decisions. A negative result may occur with  improper specimen  collection/handling, submission of specimen other than nasopharyngeal swab, presence of viral mutation(s) within the areas targeted by this assay, and inadequate number of viral copies(<138 copies/mL). A negative result must be combined with clinical observations, patient history, and epidemiological information. The expected result is Negative.  Fact Sheet for Patients:  EntrepreneurPulse.com.au  Fact Sheet for Healthcare Providers:  IncredibleEmployment.be  This test is no t yet approved or cleared by the [place] FDA and  has been authorized for detection and/or diagnosis of SARS-CoV-2 by FDA under an Emergency Use Authorization (EUA). This EUA will remain  in effect (meaning this test can be used) for the duration of the COVID-19 declaration under Section 564(b)(1) of the Act, 21 U.S.C.section 360bbb-3(b)(1), unless the authorization is terminated  or revoked sooner.       Influenza A by PCR NEGATIVE NEGATIVE Final   Influenza B by PCR NEGATIVE NEGATIVE Final    Comment: (NOTE) The Xpert Xpress SARS-CoV-2/FLU/RSV plus assay is intended as an aid in the diagnosis of influenza from Nasopharyngeal swab specimens and should not be used as a sole basis for treatment. Nasal washings and aspirates are unacceptable for Xpert Xpress SARS-CoV-2/FLU/RSV testing.  Fact Sheet for Patients: EntrepreneurPulse.com.au  Fact Sheet for Healthcare Providers: IncredibleEmployment.be  This test is not yet approved or cleared by the [place] FDA and has been authorized for detection and/or diagnosis of SARS-CoV-2 by FDA under an Emergency Use Authorization (EUA). This EUA will remain in effect (meaning this test can be used) for the duration of the COVID-19 declaration under Section 564(b)(1) of the Act, 21 U.S.C. section 360bbb-3(b)(1), unless the authorization is terminated or revoked.  Performed at [facility], [address], [place], [place] [address]           Radiology Studies: DG Abd 2 Views  Result Date: 01/04/2021 CLINICAL DATA:  Distension left lower quadrant pain EXAM: ABDOMEN - 2 VIEW COMPARISON:  CT 12/31/2020 FINDINGS: Lung bases are clear. Nonobstructed gas pattern with moderate stool. Postsurgical changes in the left lower quadrant. No radiopaque calculi IMPRESSION: Negative. Electronically Signed  By: Donavan Foil M.D.   On: 01/04/2021 19:06        Scheduled Meds:  amLODipine  10 mg Oral Daily   aspirin  81 mg Oral Daily   atorvastatin  80 mg Oral q1800   clopidogrel  75 mg Oral Daily   divalproex  1,000 mg Oral Daily   ezetimibe  10 mg Oral Daily   furosemide  40 mg Intravenous Once   heparin  5,000 Units Subcutaneous Q8H   isosorbide mononitrate  60 mg Oral Daily   metoCLOPramide (REGLAN) injection  10 mg Intravenous Q6H   mometasone-formoterol  2 puff Inhalation BID   pantoprazole  40 mg Oral BID AC   simethicone  160 mg Oral QID   tiotropium  18 mcg Inhalation Daily   Continuous Infusions:  piperacillin-tazobactam (ZOSYN)  IV 3.375 g (01/05/21 6301)   promethazine (PHENERGAN) injection (IM or IVPB) Stopped (01/01/21 0413)     LOS: 4 days    Time spent: 40 minutes    Irine Seal, MD [facility]   To contact the attending provider between 7A-7P or the covering provider during after hours 7P-7A, please log into the web site [email or web address] and access using universal [place] password for that web site. If you do not have the password, please call the hospital operator.  01/05/2021, 11:02 AM

## 2021-01-05 NOTE — Discharge Instructions (Signed)
Gastroparesis Nutrition Therapy  Gastroparesis means that your stomach empties very slowly. This happens when the nerves to your stomach are damaged or do not work properly. This can cause bloating, stomach discomfort or pain, feeling full after eating only a small amount of food, nausea, or vomiting. If you have diabetes in addition to gastroparesis, it is important to control your blood glucose. This will help the stomach empty.  Tips: Following these tips may help your stomach empty faster:  Eat small, frequent meals (4 to 6 times per day).  Do not eat solid foods that are high in fat and do not add too much fat to foods. See the Foods Not Recommended table for foods that are high fat. High-fat solid foods may delay the emptying of your stomach. Liquids that contain fat, such as milkshakes, may be tolerated and can provide needed calories.  Do not eat foods high in fiber. Do not take fiber supplements or fiber bulking agents for constipation.  Do not eat foods that increase acid reflux: Acidic, spicy, fried and greasy foods, Caffeine, Mint  Do not drink alcohol or smoke  Do not drink carbonated beverages, as they increase bloating.  Chew foods well before swallowing. Solid foods in the stomach do not empty well. If you have difficulty tolerating solid foods, ground foods may be better.  If symptoms are severe, semi-solid foods or liquids may need to be your main food sources. Choose liquid nutritional supplements that have less than or equal to 2 grams fiber per serving.  Sit upright while eating and sit upright or walk after meals. Do not lie down for 3 to 4 hours after eating to avoid reflux or regurgitation. If you wish to nap during the day, nap first and then eat.  Drinking fluids at meals can take up room in your stomach, and you might not get enough calories. At every meal, first eat a grain food and a protein food or dairy product if your body can tolerate it. Drink fluids with calories. It  may be better to delay fluids until after the meal and drink more between meals  Foods Not Recommended:  Bran Grains foods with 2 or more grams of fiber per serving: barley, brown rice, kasha, quinoa Popcorn Whole grain and high-fiber cereals, including oats or granola Whole grain bread or pasta Fried meats, poultry or fish Sausage, bacon or hot dogs Seafood Tough meat, meat with gristle: steak, roast beef or pork chops Beans, peas or lentils Nuts Cheese slices Liquid nutritional supplements that have more than 2 grams fiber per serving Pea milk, soymilk (may increase gas and bloating) Raw or undercooked vegetables Alfalfa, asparagus, bean sprouts, broccoli, brussels sprouts, cabbage, cauliflower, corn, green peas or any other kind of peas, lima beans, mushrooms, okra, onions, parsnips, peppers, pickles, potato skins, or spinach  Fresh fruit except for the ones in the foods recommended table Acidic fruit and juices: oranges/orange juice, grapefruit/grapefruit juice, tomatoes/tomato juice Avocado Berries Coconut Dried fruit Fruit skin Mandarin oranges Pineapple  Fried foods of any type Coffee Olives or pickles Pizza Salsa Sushi    Fiber-Restricted (13 Grams) Nutrition Therapy   A fiber-restricted diet contains less than 13 grams of fiber daily. Your registered dietitian nutritionist (RDN) or health care provider may suggest you eat less fiber if you have Crohn's disease or ulcerative colitis and are in a flare or are taking prednisone or budesonide medications. You might also be prescribed this diet if you have irritable bowel syndrome with diarrhea  or if you are recovering from gastrointestinal surgery. As your symptoms and condition get better, your RDN or health care provider will help you add more fiber to your diet. It's also important to eat enough protein foods while you are on a fiber-restricted diet.  A fiber-restricted diet includes limited amounts of foods that your body cannot digest.  This diet should help you slow the movement of food in your intestines and lower the amount and bulk of your stool. It may also help with your diarrhea, stomach pain, gas and bloating.  A fiber-restricted diet may be low in some nutrients, because a variety of foods are limited to reduce symptoms. Take a chewable multivitamin with minerals to make sure you are getting enough nutrients. You might need calcium with vitamin D supplements too if you're not able to eat enough calcium and vitamin D in your diet.     Tips Eat about 5 to 6 small meals every 3 or 4 hours daily. Eat a protein food or dairy product at every meal or snack if your body can tolerate it. See the Foods Recommended table for ideas. Avoid acidic, spicy, fried, greasy and high-fat foods. You may need to limit foods/beverages that contain: Sugar Lactose. Try lactose-free products to reduce symptoms of gas or bloating. Fructose High-fructose corn syrup Sugar-free sweeteners such as aspartame, sucralose, or sorbitol Caffeine Do not eat whole grains, seeds, fruit and vegetable peels or skins, whole nuts, raw vegetables, most raw fruits and the connective tissues of meats. If you have a stricture, avoid all whole grains, raw fruits and raw vegetables and switch to a low-fiber diet (less than 8 grams fiber daily). Take calcium with vitamin D supplements at a different time than the multivitamin with minerals. All vitamin and mineral supplements should be taken with food. Choose foods that have been safely handled and prepared to lower your risk of foodborne illness.

## 2021-01-05 NOTE — Progress Notes (Addendum)
   01/05/21 1708  Assess: MEWS Score  Temp 98.7 F (37.1 C)  BP (!) 169/99  Pulse Rate (!) 114  Resp 19  SpO2 99 %  O2 Device Room Air  Assess: MEWS Score  MEWS Temp 0  MEWS Systolic 0  MEWS Pulse 2  MEWS RR 0  MEWS LOC 0  MEWS Score 2  MEWS Score Color Yellow  Assess: if the MEWS score is Yellow or Red  Were vital signs taken at a resting state? Yes  Focused Assessment Change from prior assessment (see assessment flowsheet)  Does the patient meet 2 or more of the SIRS criteria? No  MEWS guidelines implemented *See Row Information* Yes  Treat  MEWS Interventions Administered prn meds/treatments;Other (Comment) (Patient given pain medications,Suppository ordered for hemmoroids, & EKG ordered)  Take Vital Signs  Increase Vital Sign Frequency  Yellow: Q 2hr X 2 then Q 4hr X 2, if remains yellow, continue Q 4hrs  Escalate  MEWS: Escalate Yellow: discuss with charge nurse/RN and consider discussing with provider and RRT  Notify: Charge Nurse/RN  Name of Charge Nurse/RN Notified Kelly,RN  Date Charge Nurse/RN Notified 01/05/21  Time Charge Nurse/RN Notified 1718  Notify: Provider  Provider Name/Title Dr. Grandville Silos  Date Provider Notified 01/05/21  Time Provider Notified 1718  Notification Type Page  Notification Reason Change in status  Provider response See new orders  Date of Provider Response 01/05/21  Time of Provider Response 1719  Document  Patient Outcome Other (Comment) (EKG to be obtained and orders for laxatives are due at 2200. Awaiting outcome.)  Progress note created (see row info) Yes  Assess: SIRS CRITERIA  SIRS Temperature  0  SIRS Pulse 1  SIRS Respirations  0  SIRS WBC 0  SIRS Score Sum  1  EKG completed and MD aware

## 2021-01-05 NOTE — Progress Notes (Signed)
Nutrition Brief Note  RD consulted to provide diet education.  Admitting Dx: Diverticulitis [K57.92] AKI (acute kidney injury) ([place]) [N17.9] Acute diverticulitis [K57.92] PMH:  Past Medical History:  Diagnosis Date   Anxiety    Asthma    Bipolar 1 disorder ([place])    Bipolar disorder ([place])    CAD (coronary artery disease)    s/p stent BMS OM Cx   Cervical herniated disc 04/12/2016   COPD (chronic obstructive pulmonary disease) (HCC)    Depression    Diabetes mellitus without complication (HCC)    Diverticulitis    GERD (gastroesophageal reflux disease)    Glaucoma    History of blood transfusion    Hyperlipidemia    Hypertension    OSA (obstructive sleep apnea)    not using cpap    Plantar fasciitis    b/l feet s/p steroid shots w/o help and left surgery w/o help    UTI (urinary tract infection)    Medications:  Scheduled Meds:  amLODipine  10 mg Oral Daily   aspirin  81 mg Oral Daily   atorvastatin  80 mg Oral q1800   clopidogrel  75 mg Oral Daily   divalproex  1,000 mg Oral Daily   enoxaparin (LOVENOX) injection  40 mg Subcutaneous Q24H   ezetimibe  10 mg Oral Daily   furosemide  40 mg Intravenous Once   hydrocortisone  25 mg Rectal BID   isosorbide mononitrate  60 mg Oral Daily   magnesium citrate  1 Bottle Oral Once   metoCLOPramide (REGLAN) injection  10 mg Intravenous Q6H   mometasone-formoterol  2 puff Inhalation BID   pantoprazole  40 mg Oral BID AC   polyethylene glycol  17 g Oral BID   senna-docusate  1 tablet Oral BID   simethicone  160 mg Oral QID   tiotropium  18 mcg Inhalation Daily   Continuous Infusions:  piperacillin-tazobactam (ZOSYN)  IV 3.375 g (01/05/21 1229)   promethazine (PHENERGAN) injection (IM or IVPB) Stopped (01/01/21 0413)   Labs: Recent Labs  Lab 12/31/20 1757 01/01/21 0422 01/03/21 0510 01/04/21 1601 01/05/21 0825  NA  --    < > 140 139 140  K  --    < > 3.7 4.0 4.2  CL  --    < > 103 102 105  CO2  --    < > 29 28 27    BUN  --    < > <5* <5* 7  CREATININE  --    < > 0.79 0.79 0.75  CALCIUM  --    < > 8.6* 9.4 8.9  MG 2.4  --  1.9  --  2.0  GLUCOSE  --    < > 112* 122* 124*   < > = values in this interval not displayed.   Wt Readings from Last 15 Encounters:  12/31/20 87 kg  12/17/20 87.1 kg  12/05/20 87.1 kg  11/28/20 87.1 kg  10/18/20 87.1 kg  10/15/20 87.1 kg  10/05/20 87.1 kg  10/02/20 87.3 kg  09/19/20 87.1 kg  09/11/20 87.1 kg  08/30/20 87.1 kg  08/09/20 87.6 kg  07/16/20 87.5 kg  05/25/20 80 kg  05/22/20 80 kg   Body mass index is 33.98 kg/m. Patient meets criteria for obesity based on current BMI.   Current diet order is soft, patient is consuming approximately 93% of meals at this time. Education materials regarding diet for gastroparesis/diverticulitis provided in discharge summary as pt likely nearing discharge per MD.  No additional nutrition interventions warranted at this time. If nutrition issues arise, please consult RD.   Larkin Ina, MS, RD, LDN (she/her/hers) RD pager number and weekend/on-call pager number located in [place].

## 2021-01-06 ENCOUNTER — Encounter: Payer: Self-pay | Admitting: Internal Medicine

## 2021-01-06 ENCOUNTER — Inpatient Hospital Stay: Payer: Medicare Other

## 2021-01-06 LAB — GASTROINTESTINAL PANEL BY PCR, STOOL (REPLACES STOOL CULTURE)

## 2021-01-06 LAB — CBC WITH DIFFERENTIAL/PLATELET
Abs Immature Granulocytes: 0.71 10*3/uL — ABNORMAL HIGH (ref 0.00–0.07)
Basophils Absolute: 0.1 10*3/uL (ref 0.0–0.1)
Basophils Relative: 1 %
Eosinophils Absolute: 0 10*3/uL (ref 0.0–0.5)
Eosinophils Relative: 0 %
HCT: 33.4 % — ABNORMAL LOW (ref 36.0–46.0)
Hemoglobin: 11.6 g/dL — ABNORMAL LOW (ref 12.0–15.0)
Immature Granulocytes: 3 %
Lymphocytes Relative: 11 %
Lymphs Abs: 2.7 10*3/uL (ref 0.7–4.0)
MCH: 31.5 pg (ref 26.0–34.0)
MCHC: 34.7 g/dL (ref 30.0–36.0)
MCV: 90.8 fL (ref 80.0–100.0)
Monocytes Absolute: 1.9 10*3/uL — ABNORMAL HIGH (ref 0.1–1.0)
Monocytes Relative: 8 %
Neutro Abs: 18.7 10*3/uL — ABNORMAL HIGH (ref 1.7–7.7)
Neutrophils Relative %: 77 %
Platelets: 620 10*3/uL — ABNORMAL HIGH (ref 150–400)
RBC: 3.68 MIL/uL — ABNORMAL LOW (ref 3.87–5.11)
RDW: 14.3 % (ref 11.5–15.5)
WBC: 24.1 10*3/uL — ABNORMAL HIGH (ref 4.0–10.5)
nRBC: 0 % (ref 0.0–0.2)

## 2021-01-06 LAB — BASIC METABOLIC PANEL
Anion gap: 7 (ref 5–15)
BUN: 10 mg/dL (ref 6–20)
CO2: 26 mmol/L (ref 22–32)
Calcium: 9.2 mg/dL (ref 8.9–10.3)
Chloride: 105 mmol/L (ref 98–111)
Creatinine, Ser: 0.79 mg/dL (ref 0.44–1.00)
GFR, Estimated: >60 mL/min (ref >60)
Glucose, Bld: 132 mg/dL — ABNORMAL HIGH (ref 70–99)
Potassium: 3.9 mmol/L (ref 3.5–5.1)
Sodium: 138 mmol/L (ref 135–145)

## 2021-01-06 LAB — C DIFFICILE (CDIFF) QUICK SCRN (NO PCR REFLEX)
C Diff antigen: NEGATIVE
C Diff interpretation: NOT DETECTED
C Diff toxin: NEGATIVE

## 2021-01-06 LAB — MAGNESIUM: Magnesium: 1.7 mg/dL (ref 1.7–2.4)

## 2021-01-06 MED ORDER — IOHEXOL 300 MG/ML  SOLN
100.0000 mL | Freq: Once | INTRAMUSCULAR | Status: AC | PRN
  Administered 2021-01-06: 100 mL via INTRAVENOUS

## 2021-01-06 MED ORDER — PANTOPRAZOLE SODIUM 40 MG IV SOLR
40.0000 mg | Freq: Two times a day (BID) | INTRAVENOUS | Status: DC
  Administered 2021-01-06 – 2021-01-09 (×6): 40 mg via INTRAVENOUS
  Filled 2021-01-06 (×6): qty 40

## 2021-01-06 MED ORDER — SODIUM CHLORIDE 0.9 % IV SOLN
INTRAVENOUS | Status: DC | PRN
  Administered 2021-01-06: 250 mL via INTRAVENOUS

## 2021-01-06 MED ORDER — ONDANSETRON HCL 4 MG/2ML IJ SOLN
4.0000 mg | INTRAMUSCULAR | Status: DC | PRN
  Administered 2021-01-06 – 2021-01-08 (×4): 4 mg via INTRAVENOUS
  Filled 2021-01-06 (×4): qty 2

## 2021-01-06 MED ORDER — PANTOPRAZOLE SODIUM 40 MG IV SOLR: 40.0000 mg | Freq: Two times a day (BID) | INTRAVENOUS | Status: DC

## 2021-01-06 MED ORDER — MAGNESIUM SULFATE 4 GM/100ML IV SOLN
4.0000 g | Freq: Once | INTRAVENOUS | Status: AC
  Administered 2021-01-06: 4 g via INTRAVENOUS
  Filled 2021-01-06: qty 100

## 2021-01-06 MED ORDER — IOHEXOL 9 MG/ML PO SOLN
500.0000 mL | ORAL | Status: AC
  Administered 2021-01-06 (×2): 500 mL via ORAL

## 2021-01-06 NOTE — Progress Notes (Signed)
GI Inpatient Follow-up Note  Subjective:  Patient seen and states after enema yesterday, she developed abdominal pain and small volume hematochezia. Still has left lower quadrant abdominal pain.  Scheduled Inpatient Medications:   amLODipine  10 mg Oral Daily   aspirin  81 mg Oral Daily   atorvastatin  80 mg Oral q1800   clopidogrel  75 mg Oral Daily   divalproex  1,000 mg Oral Daily   enoxaparin (LOVENOX) injection  40 mg Subcutaneous Q24H   ezetimibe  10 mg Oral Daily   furosemide  40 mg Intravenous Once   hydrocortisone  25 mg Rectal BID   isosorbide mononitrate  60 mg Oral Daily   mometasone-formoterol  2 puff Inhalation BID   pantoprazole  40 mg Oral BID AC   polyethylene glycol  17 g Oral BID   senna-docusate  1 tablet Oral BID   simethicone  160 mg Oral QID   tiotropium  18 mcg Inhalation Daily    Continuous Inpatient Infusions:    piperacillin-tazobactam (ZOSYN)  IV 3.375 g (01/06/21 0521)   promethazine (PHENERGAN) injection (IM or IVPB) 12.5 mg (01/06/21 0409)    PRN Inpatient Medications:  acetaminophen, albuterol, alum & mag hydroxide-simeth **AND** lidocaine, hydrALAZINE, HYDROmorphone (DILAUDID) injection, methocarbamol, ondansetron (ZOFRAN) IV, promethazine (PHENERGAN) injection (IM or IVPB), traMADol, traZODone  Review of Systems:  Review of Systems  Constitutional:  Negative for chills and fever.  Respiratory:  Negative for cough.   Cardiovascular:  Negative for chest pain.  Gastrointestinal:  Positive for abdominal pain, blood in stool and constipation. Negative for diarrhea and melena.  Skin:  Negative for itching and rash.  Neurological:  Negative for focal weakness.  Psychiatric/Behavioral:  Negative for substance abuse.   All other systems reviewed and are negative.    Physical Examination: BP 127/82   Pulse 98   Temp 98.5 F (36.9 C)   Resp 16   Ht 5\' 3"  (1.6 m)   Wt 87 kg   SpO2 98%   BMI 33.98 kg/m  Gen: NAD, alert and oriented x  4 HEENT: PEERLA, EOMI, Neck: supple, no JVD or thyromegaly Chest: No respiratory distress CV: RRR Abd: soft, left lower quadrant tenderness Skin: no rash or lesions noted Lymph: no LAD  Data: Lab Results  Component Value Date   WBC 24.1 (H) 01/06/2021   HGB 11.6 (L) 01/06/2021   HCT 33.4 (L) 01/06/2021   MCV 90.8 01/06/2021   PLT 620 (H) 01/06/2021   Recent Labs  Lab 01/04/21 0638 01/05/21 0825 01/06/21 0612  HGB 10.8* 10.7* 11.6*   Lab Results  Component Value Date   NA 138 01/06/2021   K 3.9 01/06/2021   CL 105 01/06/2021   CO2 26 01/06/2021   BUN 10 01/06/2021   CREATININE 0.79 01/06/2021   Lab Results  Component Value Date   ALT 43 01/03/2021   AST 19 01/03/2021   ALKPHOS 61 01/03/2021   BILITOT 0.4 01/03/2021   No results for input(s): APTT, INR, PTT in the last 168 hours. Assessment/Plan: Ms. Kissner is a 55 y.o. lady with diverticulitis who developed significant abdominal pain after enema yesterday in addition to hematochezia. Possible small amount of rectal trauma? Regardless given elevation in white count would repeat imaging with contrast.  Recommendations:  - repeat CT abdomen with contrast - will dc reglan as relatively contraindicated if significantly constipated - limit opioids as possible - will continue to follow  Please call with any questions or concerns.  Raylene Miyamoto MD,  [facility]

## 2021-01-06 NOTE — Progress Notes (Signed)
Pt c/o abd pain and n/v throughout shift. States it began after receiving enema during day shift. Pt given IV dilaudid and zofran. IV phenergan infusing at this time. Pt experiencing watery, bloody stools overnight and vomiting large amounts of emesis. MD Mansy made aware of overnight events.

## 2021-01-06 NOTE — Progress Notes (Signed)
PROGRESS NOTE    Shawna Anderson  UUV:[id number] DOB: 06-04-1966 DOA: 12/31/2020 PCP: McLean-Scocuzza, Nino Glow, MD    Chief Complaint  Patient presents with   Abdominal Pain   Emesis    Brief Narrative:  Shawna GERSTENBERGER is an 55 y.o. female with medical history significant for CAD s/p PCI, hypertension, asthma/COPD, OSA not on CPAP, history of esophageal dysphagia with Schatzki's ring, s/p sigmoid colon resection for bowel perforation who presents with persistent nausea, vomiting and abdominal pain.  Found to have hypokalemia and AKI and recurrent acute diverticulitis on CT abdomen and pelvis.       Assessment & Plan:   Principal Problem:   Acute diverticulitis Active Problems:   CAD S/P CFX PCI 2010   Bipolar disorder (HCC)   OSA (obstructive sleep apnea)   Gastroesophageal reflux disease   AKI (acute kidney injury) ([place])   Hypokalemia   Transaminitis   Diverticulitis   Gastroparesis   1 recurrent acute diverticulitis -Patient noted to have recently admitted from 6/6-12/19/2020 for acute diverticulitis and noted to have been discharged on ciprofloxacin and Flagyl. -Symptoms fluctuating. -Afebrile. -Leukocytosis trending back up.  Patient with ongoing abdominal pain.   -Patient noted to have worsening abdominal pain, nausea vomiting, hematochezia after smog enema attempted yesterday.   -Patient being followed by GI who are recommending repeat CT abdomen and pelvis.   -Scheduled IV Reglan has been discontinued.   -Continue IV Zosyn.   -Continue current soft diet.   -GI following and appreciate input and recommendations.    2.  Acute kidney injury -Secondary to prerenal azotemia.   -Resolved with hydration.   -Saline lock IV fluids.   3.  Hypokalemia -Likely secondary to GI losses. -Repleted. -Potassium at 3.9.  Magnesium at 1.7.   -Magnesium sulfate 4 g IV x1 to keep magnesium approximately at 2.   4.  Mild transaminitis -Resolved with hydration.    -Saline lock IV fluids.   5.  History of coronary artery disease -Stable. -Continue Plavix, aspirin, Lipitor, Zetia, Imdur.  6.  Hypertension -Controlled on current regimen of Imdur, Norvasc.  7.  GERD PPI twice daily.  8.  Bipolar disorder -Stable.   -Continue home regimen Depakote, trazodone.    9.  Obesity  10.  OSA -Not on CPAP.  11.  Constipation -Patient noted the afternoon of 01/04/2021, with complaints of significant abdominal pain, abdominal distention after eating solid meals. -KUB done negative for ileus or small bowel obstruction however did note moderate stool. -Fleets enema ordered with some clinical improvement however no significant results from constipation. -Smog enema ordered which per patient led to hematochezia and activation of the hemorrhoids, significant abdominal pain, ongoing nausea and vomiting throughout the night. -Patient noted to have refused magnesium citrate yesterday. -Continue current bowel regimen of Senokot-S twice daily, MiraLAX twice daily.  12.  Minimal- mild gastroparesis -Patient underwent gastric emptying study (07/20/2020) with slightly delayed late phase gastric emptying at 3 and 4 hours. -Patient with bloating, abdominal distention after solid food intake on 01/04/2021. -Patient started on IV Reglan per GI recommendations which has subsequently been discontinued due to concerns for constipation.  -Outpatient follow-up with GI.  13.  Hematochezia -Patient stated started after smog enema given.  Concern for hemorrhoidal bleeding due to trauma from enema. -H&H stable. -Repeat H&H this afternoon. -Continue Anusol suppositories.    DVT prophylaxis: Heparin>>>> Lovenox Code Status: Full Family Communication: Updated patient.  No family at bedside.  Disposition:   Status is: Inpatient  Remains inpatient appropriate because:IV treatments appropriate due to intensity of illness or inability to take PO  Dispo: The patient is from:  Home              Anticipated d/c is to: Home              Patient currently is not medically stable to d/c.   Difficult to place patient No       Consultants:  Gastroenterology: Gerarda Gunther ,[place] 01/02/2021  Procedures:  CT abdomen and pelvis 12/31/2020    Antimicrobials: IV Zosyn 12/31/2020>>>>   Subjective: Patient stated after smog enema yesterday developed significant abdominal pain, hematochezia, nausea and vomiting all night.  States last episode of emesis was around 4 AM today.  Nausea improving.  Still with left lower quadrant abdominal pain and ongoing hematochezia.  Still with complaints of constipation with no significant bowel movement after oral laxatives last night.    Objective: Vitals:   01/06/21 0200 01/06/21 0215 01/06/21 0456 01/06/21 0757  BP: (!) 158/93  108/60 127/82  Pulse: (!) 117 (!) 104 (!) 105 98  Resp: 18  16 16   Temp:   99 F (37.2 C) 98.5 F (36.9 C)  TempSrc:   Oral   SpO2: 99%  97% 98%  Weight:      Height:        Intake/Output Summary (Last 24 hours) at 01/06/2021 1100 Last data filed at 01/06/2021 1026 Gross per 24 hour  Intake 620 ml  Output --  Net 620 ml    Filed Weights   12/31/20 1517  Weight: 87 kg    Examination:  General exam: : NAD Respiratory system: CTAB.  No wheezes, no rhonchi.  Speaking in full sentences.  Normal respiratory effort. Cardiovascular system: Regular rate and rhythm no murmurs rubs or gallops.  No JVD.  No lower extremity edema.  Gastrointestinal system: Abdomen soft, nondistended, positive bowel sounds, some tenderness to palpation left lower quadrant.  No rebound.  No guarding.  Central nervous system: Alert and oriented. No focal neurological deficits. Extremities: Symmetric 5 x 5 power. Skin: No rashes, lesions or ulcers Psychiatry: Judgement and insight appear normal. Mood & affect appropriate.   Data Reviewed: I have personally reviewed following labs and imaging studies  CBC: Recent  Labs  Lab 01/02/21 0500 01/03/21 0510 01/04/21 0638 01/05/21 0825 01/06/21 0612  WBC 9.6 10.3 10.2 13.4* 24.1*  NEUTROABS  --  4.4  --  7.3 18.7*  HGB 10.4* 10.4* 10.8* 10.7* 11.6*  HCT 30.9* 30.9* 31.3* 31.8* 33.4*  MCV 90.9 89.8 89.2 91.4 90.8  PLT 340 397 478* 533* 620*     Basic Metabolic Panel: Recent Labs  Lab 12/31/20 1757 01/01/21 0422 01/02/21 0500 01/03/21 0510 01/04/21 0638 01/05/21 0825 01/06/21 0612  NA  --    < > 139 140 139 140 138  K  --    < > 3.7 3.7 4.0 4.2 3.9  CL  --    < > 102 103 102 105 105  CO2  --    < > 31 29 28 27 26   GLUCOSE  --    < > 124* 112* 122* 124* 132*  BUN  --    < > 11 <5* <5* 7 10  CREATININE  --    < > 0.82 0.79 0.79 0.75 0.79  CALCIUM  --    < > 8.5* 8.6* 9.4 8.9 9.2  MG 2.4  --   --  1.9  --  2.0 1.7   < > = values in this interval not displayed.     GFR: Estimated Creatinine Clearance: 85 mL/min (by C-G formula based on SCr of 0.79 mg/dL).  Liver Function Tests: Recent Labs  Lab 12/31/20 1515 01/01/21 0422 01/03/21 0510  AST 50* 35 19  ALT 104* 75* 43  ALKPHOS 79 60 61  BILITOT 0.6 0.6 0.4  PROT 7.4 6.0* 5.7*  ALBUMIN 4.2 3.3* 3.1*     CBG: No results for input(s): GLUCAP in the last 168 hours.   Recent Results (from the past 240 hour(s))  CULTURE, BLOOD (ROUTINE X 2) w Reflex to ID Panel     Status: None   Collection Time: 12/31/20  7:19 PM   Specimen: BLOOD  Result Value Ref Range Status   Specimen Description BLOOD RIGHT ANTECUBITAL  Final   Special Requests   Final    BOTTLES DRAWN AEROBIC AND ANAEROBIC Blood Culture adequate volume   Culture   Final    NO GROWTH 5 DAYS Performed at [facility], [address] [place], [place] [address]    Report Status 01/05/2021 FINAL  Final  CULTURE, BLOOD (ROUTINE X 2) w Reflex to ID Panel     Status: None   Collection Time: 12/31/20  7:25 PM   Specimen: BLOOD  Result Value Ref Range Status   Specimen Description BLOOD BLOOD RIGHT HAND  Final    Special Requests   Final    BOTTLES DRAWN AEROBIC AND ANAEROBIC Blood Culture adequate volume   Culture   Final    NO GROWTH 5 DAYS Performed at [facility], [address] [place], [place] [address]    Report Status 01/05/2021 FINAL  Final  Resp Panel by RT-PCR (Flu A&B, Covid) Nasopharyngeal Swab     Status: None   Collection Time: 12/31/20  9:13 PM   Specimen: Nasopharyngeal Swab; Nasopharyngeal(NP) swabs in vial transport medium  Result Value Ref Range Status   SARS Coronavirus 2 by RT PCR NEGATIVE NEGATIVE Final    Comment: (NOTE) SARS-CoV-2 target nucleic acids are NOT DETECTED.  The SARS-CoV-2 RNA is generally detectable in upper respiratory specimens during the acute phase of infection. The lowest concentration of SARS-CoV-2 viral copies this assay can detect is 138 copies/mL. A negative result does not preclude SARS-Cov-2 infection and should not be used as the sole basis for treatment or other patient management decisions. A negative result may occur with  improper specimen collection/handling, submission of specimen other than nasopharyngeal swab, presence of viral mutation(s) within the areas targeted by this assay, and inadequate number of viral copies(<138 copies/mL). A negative result must be combined with clinical observations, patient history, and epidemiological information. The expected result is Negative.  Fact Sheet for Patients:  EntrepreneurPulse.com.au  Fact Sheet for Healthcare Providers:  IncredibleEmployment.be  This test is no t yet approved or cleared by the [place] FDA and  has been authorized for detection and/or diagnosis of SARS-CoV-2 by FDA under an Emergency Use Authorization (EUA). This EUA will remain  in effect (meaning this test can be used) for the duration of the COVID-19 declaration under Section 564(b)(1) of the Act, 21 U.S.C.section 360bbb-3(b)(1), unless the authorization is  terminated  or revoked sooner.       Influenza A by PCR NEGATIVE NEGATIVE Final   Influenza B by PCR NEGATIVE NEGATIVE Final    Comment: (NOTE) The Xpert Xpress SARS-CoV-2/FLU/RSV plus assay is intended as an aid in the diagnosis of influenza  from Nasopharyngeal swab specimens and should not be used as a sole basis for treatment. Nasal washings and aspirates are unacceptable for Xpert Xpress SARS-CoV-2/FLU/RSV testing.  Fact Sheet for Patients: EntrepreneurPulse.com.au  Fact Sheet for Healthcare Providers: IncredibleEmployment.be  This test is not yet approved or cleared by the [place] FDA and has been authorized for detection and/or diagnosis of SARS-CoV-2 by FDA under an Emergency Use Authorization (EUA). This EUA will remain in effect (meaning this test can be used) for the duration of the COVID-19 declaration under Section 564(b)(1) of the Act, 21 U.S.C. section 360bbb-3(b)(1), unless the authorization is terminated or revoked.  Performed at [facility], [address], [place], [place] [address]           Radiology Studies: DG Abd 2 Views  Result Date: 01/04/2021 CLINICAL DATA:  Distension left lower quadrant pain EXAM: ABDOMEN - 2 VIEW COMPARISON:  CT 12/31/2020 FINDINGS: Lung bases are clear. Nonobstructed gas pattern with moderate stool. Postsurgical changes in the left lower quadrant. No radiopaque calculi IMPRESSION: Negative. Electronically Signed   By: Donavan Foil M.D.   On: 01/04/2021 19:06        Scheduled Meds:  amLODipine  10 mg Oral Daily   aspirin  81 mg Oral Daily   atorvastatin  80 mg Oral q1800   clopidogrel  75 mg Oral Daily   divalproex  1,000 mg Oral Daily   enoxaparin (LOVENOX) injection  40 mg Subcutaneous Q24H   ezetimibe  10 mg Oral Daily   furosemide  40 mg Intravenous Once   hydrocortisone  25 mg Rectal BID   iohexol  500 mL Oral Q1 Hr x 2   isosorbide mononitrate  60 mg Oral  Daily   mometasone-formoterol  2 puff Inhalation BID   pantoprazole (PROTONIX) IV  40 mg Intravenous Q12H   polyethylene glycol  17 g Oral BID   senna-docusate  1 tablet Oral BID   simethicone  160 mg Oral QID   tiotropium  18 mcg Inhalation Daily   Continuous Infusions:  magnesium sulfate bolus IVPB 4 g (01/06/21 1020)   piperacillin-tazobactam (ZOSYN)  IV 3.375 g (01/06/21 0521)   promethazine (PHENERGAN) injection (IM or IVPB) 12.5 mg (01/06/21 0409)     LOS: 5 days    Time spent: 40 minutes    Irine Seal, MD [facility]   To contact the attending provider between 7A-7P or the covering provider during after hours 7P-7A, please log into the web site [email or web address] and access using universal [place] password for that web site. If you do not have the password, please call the hospital operator.  01/06/2021, 11:00 AM

## 2021-01-07 DIAGNOSIS — R112 Nausea with vomiting, unspecified: Secondary | ICD-10-CM

## 2021-01-07 DIAGNOSIS — R109 Unspecified abdominal pain: Secondary | ICD-10-CM

## 2021-01-07 DIAGNOSIS — D72829 Elevated white blood cell count, unspecified: Secondary | ICD-10-CM

## 2021-01-07 LAB — CBC WITH DIFFERENTIAL/PLATELET
Abs Immature Granulocytes: 0.26 10*3/uL — ABNORMAL HIGH (ref 0.00–0.07)
Abs Immature Granulocytes: 0.3 10*3/uL — ABNORMAL HIGH (ref 0.00–0.07)
Basophils Absolute: 0.1 10*3/uL (ref 0.0–0.1)
Basophils Absolute: 0.1 10*3/uL (ref 0.0–0.1)
Basophils Relative: 0 %
Basophils Relative: 0 %
Eosinophils Absolute: 0.2 10*3/uL (ref 0.0–0.5)
Eosinophils Absolute: 0.2 10*3/uL (ref 0.0–0.5)
Eosinophils Relative: 1 %
Eosinophils Relative: 1 %
HCT: 31.8 % — ABNORMAL LOW (ref 36.0–46.0)
HCT: 29.8 % — ABNORMAL LOW (ref 36.0–46.0)
Hemoglobin: 10.7 g/dL — ABNORMAL LOW (ref 12.0–15.0)
Hemoglobin: 10.2 g/dL — ABNORMAL LOW (ref 12.0–15.0)
Immature Granulocytes: 1 %
Immature Granulocytes: 2 %
Lymphocytes Relative: 20 %
Lymphocytes Relative: 21 %
Lymphs Abs: 4.1 10*3/uL — ABNORMAL HIGH (ref 0.7–4.0)
Lymphs Abs: 4 10*3/uL (ref 0.7–4.0)
MCH: 30.8 pg (ref 26.0–34.0)
MCH: 31.3 pg (ref 26.0–34.0)
MCHC: 33.6 g/dL (ref 30.0–36.0)
MCHC: 34.2 g/dL (ref 30.0–36.0)
MCV: 91.6 fL (ref 80.0–100.0)
MCV: 91.4 fL (ref 80.0–100.0)
Monocytes Absolute: 2 10*3/uL — ABNORMAL HIGH (ref 0.1–1.0)
Monocytes Absolute: 1.7 10*3/uL — ABNORMAL HIGH (ref 0.1–1.0)
Monocytes Relative: 10 %
Monocytes Relative: 9 %
Neutro Abs: 13.6 10*3/uL — ABNORMAL HIGH (ref 1.7–7.7)
Neutro Abs: 13.3 10*3/uL — ABNORMAL HIGH (ref 1.7–7.7)
Neutrophils Relative %: 68 %
Neutrophils Relative %: 67 %
Platelets: 621 10*3/uL — ABNORMAL HIGH (ref 150–400)
Platelets: 605 10*3/uL — ABNORMAL HIGH (ref 150–400)
RBC: 3.47 MIL/uL — ABNORMAL LOW (ref 3.87–5.11)
RBC: 3.26 MIL/uL — ABNORMAL LOW (ref 3.87–5.11)
RDW: 14.3 % (ref 11.5–15.5)
RDW: 14.3 % (ref 11.5–15.5)
Smear Review: NORMAL
WBC: 20.2 10*3/uL — ABNORMAL HIGH (ref 4.0–10.5)
WBC: 19.5 10*3/uL — ABNORMAL HIGH (ref 4.0–10.5)
nRBC: 0 % (ref 0.0–0.2)
nRBC: 0 % (ref 0.0–0.2)

## 2021-01-07 LAB — BASIC METABOLIC PANEL
Anion gap: 6 (ref 5–15)
BUN: 6 mg/dL (ref 6–20)
CO2: 28 mmol/L (ref 22–32)
Calcium: 8.4 mg/dL — ABNORMAL LOW (ref 8.9–10.3)
Chloride: 104 mmol/L (ref 98–111)
Creatinine, Ser: 0.77 mg/dL (ref 0.44–1.00)
GFR, Estimated: >60 mL/min (ref >60)
Glucose, Bld: 109 mg/dL — ABNORMAL HIGH (ref 70–99)
Potassium: 4.2 mmol/L (ref 3.5–5.1)
Sodium: 138 mmol/L (ref 135–145)

## 2021-01-07 LAB — MAGNESIUM: Magnesium: 2.1 mg/dL (ref 1.7–2.4)

## 2021-01-07 NOTE — Care Management Important Message (Signed)
Important Message  Patient Details  Name: Shawna Anderson MRN: [id number] Date of Birth: May 03, 1966   Medicare Important Message Given:  Yes     [name] 01/07/2021, 12:00 PM

## 2021-01-07 NOTE — Progress Notes (Signed)
[facility] Inpatient Progress Note  Subjective: Patient seen for f/u diverticulitis. Patient with midly increased pain throughout the weekend. CT scan shows persistent wall thickening and WBC has increased. Patient certainly appears in NAD but c/o persistent bloating and nausea with meals. NO evidence of bowel obstruction on xray. Has previous colon resection due to spontaneous rupture of colon. Objective: Vital signs in last 24 hours: Temp:  [97.9 F (36.6 C)-99.3 F (37.4 C)] 98.2 F (36.8 C) (06/27 1331) Pulse Rate:  [80-102] 89 (06/27 1331) Resp:  [15-20] 15 (06/27 1331) BP: (110-153)/(65-81) 129/81 (06/27 1331) SpO2:  [96 %-99 %] 99 % (06/27 0719) Blood pressure 129/81, pulse 89, temperature 98.2 F (36.8 C), temperature source Oral, resp. rate 15, height 5\' 3"  (1.6 m), weight 87 kg, SpO2 99 %.    Intake/Output from previous day: 06/26 0701 - 06/27 0700 In: 1680 [P.O.:1100; I.V.:32.5; IV Piggyback:547.4] Out: -   Intake/Output this shift: Total I/O In: 600 [P.O.:600] Out: 200 [Emesis/NG output:200]   General appearance:  Alert, NAD Resp:  CTA Cardio:  RRR GI:  Distended with LLQ tenderness Extremities:  Trace edema   Lab Results: Results for orders placed or performed during the hospital encounter of 12/31/20 (from the past 24 hour(s))  Gastrointestinal Panel by PCR , Stool     Status: None   Collection Time: 01/06/21  9:23 PM   Specimen: Stool  Result Value Ref Range   Campylobacter species NOT DETECTED NOT DETECTED   Plesimonas shigelloides NOT DETECTED NOT DETECTED   Salmonella species NOT DETECTED NOT DETECTED   Yersinia enterocolitica NOT DETECTED NOT DETECTED   Vibrio species NOT DETECTED NOT DETECTED   Vibrio cholerae NOT DETECTED NOT DETECTED   Enteroaggregative E coli (EAEC) NOT DETECTED NOT DETECTED   Enteropathogenic E coli (EPEC) NOT DETECTED NOT DETECTED   Enterotoxigenic E coli (ETEC) NOT DETECTED NOT DETECTED   Shiga like  toxin producing E coli (STEC) NOT DETECTED NOT DETECTED   Shigella/Enteroinvasive E coli (EIEC) NOT DETECTED NOT DETECTED   Cryptosporidium NOT DETECTED NOT DETECTED   Cyclospora cayetanensis NOT DETECTED NOT DETECTED   Entamoeba histolytica NOT DETECTED NOT DETECTED   Giardia lamblia NOT DETECTED NOT DETECTED   Adenovirus F40/41 NOT DETECTED NOT DETECTED   Astrovirus NOT DETECTED NOT DETECTED   Norovirus GI/GII NOT DETECTED NOT DETECTED   Rotavirus A NOT DETECTED NOT DETECTED   Sapovirus (I, II, IV, and V) NOT DETECTED NOT DETECTED  C Difficile Quick Screen (NO PCR Reflex)     Status: None   Collection Time: 01/06/21  9:23 PM   Specimen: Stool  Result Value Ref Range   C Diff antigen NEGATIVE NEGATIVE   C Diff toxin NEGATIVE NEGATIVE   C Diff interpretation No C. difficile detected.   Magnesium     Status: None   Collection Time: 01/07/21  6:59 AM  Result Value Ref Range   Magnesium 2.1 1.7 - 2.4 mg/dL  Basic metabolic panel     Status: Abnormal   Collection Time: 01/07/21  6:59 AM  Result Value Ref Range   Sodium 138 135 - 145 mmol/L   Potassium 4.2 3.5 - 5.1 mmol/L   Chloride 104 98 - 111 mmol/L   CO2 28 22 - 32 mmol/L   Glucose, Bld 109 (H) 70 - 99 mg/dL   BUN 6 6 - 20 mg/dL   Creatinine, Ser 0.77 0.44 - 1.00 mg/dL   Calcium 8.4 (L) 8.9 - 10.3 mg/dL   GFR, Estimated >  60 >60 mL/min   Anion gap 6 5 - 15  CBC with Differential/Platelet     Status: Abnormal   Collection Time: 01/07/21  6:59 AM  Result Value Ref Range   WBC 20.2 (H) 4.0 - 10.5 K/uL   RBC 3.47 (L) 3.87 - 5.11 MIL/uL   Hemoglobin 10.7 (L) 12.0 - 15.0 g/dL   HCT 31.8 (L) 36.0 - 46.0 %   MCV 91.6 80.0 - 100.0 fL   MCH 30.8 26.0 - 34.0 pg   MCHC 33.6 30.0 - 36.0 g/dL   RDW 14.3 11.5 - 15.5 %   Platelets 621 (H) 150 - 400 K/uL   nRBC 0.0 0.0 - 0.2 %   Neutrophils Relative % 68 %   Neutro Abs 13.6 (H) 1.7 - 7.7 K/uL   Lymphocytes Relative 20 %   Lymphs Abs 4.1 (H) 0.7 - 4.0 K/uL   Monocytes Relative 10 %    Monocytes Absolute 2.0 (H) 0.1 - 1.0 K/uL   Eosinophils Relative 1 %   Eosinophils Absolute 0.2 0.0 - 0.5 K/uL   Basophils Relative 0 %   Basophils Absolute 0.1 0.0 - 0.1 K/uL   Immature Granulocytes 1 %   Abs Immature Granulocytes 0.26 (H) 0.00 - 0.07 K/uL  CBC with Differential/Platelet     Status: Abnormal   Collection Time: 01/07/21  2:05 PM  Result Value Ref Range   WBC 19.5 (H) 4.0 - 10.5 K/uL   RBC 3.26 (L) 3.87 - 5.11 MIL/uL   Hemoglobin 10.2 (L) 12.0 - 15.0 g/dL   HCT 29.8 (L) 36.0 - 46.0 %   MCV 91.4 80.0 - 100.0 fL   MCH 31.3 26.0 - 34.0 pg   MCHC 34.2 30.0 - 36.0 g/dL   RDW 14.3 11.5 - 15.5 %   Platelets 605 (H) 150 - 400 K/uL   nRBC 0.0 0.0 - 0.2 %   Neutrophils Relative % 67 %   Neutro Abs 13.3 (H) 1.7 - 7.7 K/uL   Lymphocytes Relative 21 %   Lymphs Abs 4.0 0.7 - 4.0 K/uL   Monocytes Relative 9 %   Monocytes Absolute 1.7 (H) 0.1 - 1.0 K/uL   Eosinophils Relative 1 %   Eosinophils Absolute 0.2 0.0 - 0.5 K/uL   Basophils Relative 0 %   Basophils Absolute 0.1 0.0 - 0.1 K/uL   Smear Review Normal platelet morphology    Immature Granulocytes 2 %   Abs Immature Granulocytes 0.30 (H) 0.00 - 0.07 K/uL   Target Cells PRESENT      Recent Labs    01/06/21 0612 01/07/21 0659 01/07/21 1405  WBC 24.1* 20.2* 19.5*  HGB 11.6* 10.7* 10.2*  HCT 33.4* 31.8* 29.8*  PLT 620* 621* 605*   BMET Recent Labs    01/05/21 0825 01/06/21 0612 01/07/21 0659  NA 140 138 138  K 4.2 3.9 4.2  CL 105 105 104  CO2 27 26 28   GLUCOSE 124* 132* 109*  BUN 7 10 6   CREATININE 0.75 0.79 0.77  CALCIUM 8.9 9.2 8.4*   LFT No results for input(s): PROT, ALBUMIN, AST, ALT, ALKPHOS, BILITOT, BILIDIR, IBILI in the last 72 hours. PT/INR No results for input(s): LABPROT, INR in the last 72 hours. Hepatitis Panel No results for input(s): HEPBSAG, HCVAB, HEPAIGM, HEPBIGM in the last 72 hours. C-Diff Recent Labs    01/06/21 2123  CDIFFTOX NEGATIVE   No results for input(s): CDIFFPCR  in the last 72 hours.   Studies/Results: CT ABDOMEN PELVIS W  CONTRAST  Result Date: 01/06/2021 CLINICAL DATA:  55 y.o. female with medical history significant for CAD s/p PCI, hypertension, asthma/COPD, OSA not on CPAP, history of esophageal dysphagia with Schatzki's ring, s/p sigmoid colon resection for bowel perforation who presents with persistent nausea, vomiting and abdominal pain. Found to have hypokalemia and AKI and recurrent acute diverticulitis on CT abdomen and pelvis. EXAM: CT ABDOMEN AND PELVIS WITH CONTRAST TECHNIQUE: Multidetector CT imaging of the abdomen and pelvis was performed using the standard protocol following bolus administration of intravenous contrast. CONTRAST:  111mL OMNIPAQUE IOHEXOL 300 MG/ML  SOLN COMPARISON:  12/31/2020 FINDINGS: Lower chest: Minor linear subsegmental atelectasis, lower lobes. Lung bases otherwise clear. Hepatobiliary: No focal liver abnormality is seen. No gallstones, gallbladder wall thickening, or biliary dilatation. Pancreas: Unremarkable. No pancreatic ductal dilatation or surrounding inflammatory changes. Spleen: Normal in size without focal abnormality. Adrenals/Urinary Tract: Adrenal glands are unremarkable. Kidneys are normal, without renal calculi, focal lesion, or hydronephrosis. Bladder is unremarkable. Stomach/Bowel: There is colonic wall thickening with mild adjacent inflammatory haziness extending from the sigmoid flexure through the descending colon. There is mild wall thickening just above the level of the sigmoid colon anastomosis. Sigmoid colon below this is decompressed. There is a small outpouching from the sigmoid colon just distal to its anastomosis, demonstrating vascular clips and a calcification, similar to the prior CT. There is no other area of colonic wall thickening or inflammation. Right colon is mildly distended with stool. Normal stomach and small bowel. Vascular/Lymphatic: Aortic atherosclerosis. No enlarged lymph nodes.  Reproductive: Status post hysterectomy. No adnexal masses. Other: No abdominal wall hernia.  No ascites. Musculoskeletal: No acute or significant osseous findings. IMPRESSION: 1. Wall thickening of the left colon, from the splenic flexure through the descending colon, consistent with diffuse infectious or inflammatory colitis. More focal inflammation noted the proximal sigmoid colon on the prior study is without substantial change. There are no fluid collections to suggest an abscess. There is no extraluminal or free air. 2. No other acute abnormality within the abdomen or pelvis. Electronically Signed   By: Lajean Manes M.D.   On: 01/06/2021 14:04    Scheduled Inpatient Medications:    amLODipine  10 mg Oral Daily   aspirin  81 mg Oral Daily   atorvastatin  80 mg Oral q1800   clopidogrel  75 mg Oral Daily   divalproex  1,000 mg Oral Daily   enoxaparin (LOVENOX) injection  40 mg Subcutaneous Q24H   ezetimibe  10 mg Oral Daily   furosemide  40 mg Intravenous Once   hydrocortisone  25 mg Rectal BID   isosorbide mononitrate  60 mg Oral Daily   mometasone-formoterol  2 puff Inhalation BID   pantoprazole (PROTONIX) IV  40 mg Intravenous Q12H   polyethylene glycol  17 g Oral BID   senna-docusate  1 tablet Oral BID   simethicone  160 mg Oral QID   tiotropium  18 mcg Inhalation Daily    Continuous Inpatient Infusions:    sodium chloride 10 mL/hr at 01/07/21 0348   piperacillin-tazobactam (ZOSYN)  IV 3.375 g (01/07/21 1208)   promethazine (PHENERGAN) injection (IM or IVPB) Stopped (01/06/21 0425)    PRN Inpatient Medications:  sodium chloride, acetaminophen, albuterol, alum & mag hydroxide-simeth **AND** lidocaine, hydrALAZINE, HYDROmorphone (DILAUDID) injection, methocarbamol, ondansetron (ZOFRAN) IV, promethazine (PHENERGAN) injection (IM or IVPB), traMADol, traZODone  Miscellaneous:   Assessment:  Recurrent diveritculitis. Hx of partial colon resection  LLQ abdominal  pain. Progressive leukocytosis.  Plan:  Patient is  requesting surgical evaluation to consider "laparoscopy" given ongoing pain. While I did not expressly say it was necessary, I did not feel it prudent to offer a surgical opinion on the matter. Will ask surgery to render an opinion on the patient's request as well as an overall response to the patient's stalled clinical recovery. Continue IV antibiotics. Following.  Serenity Batley K. Alice Reichert, M.D. 01/07/2021, 3:50 PM

## 2021-01-07 NOTE — Telephone Encounter (Signed)
Blood pressure over the day yesterday, 01/06/21.   BP: (!) 158/93   108/60 127/82

## 2021-01-07 NOTE — Progress Notes (Signed)
PROGRESS NOTE    Shawna Anderson  OQH:[id number] DOB: 05/29/1966 DOA: 12/31/2020 PCP: McLean-Scocuzza, Nino Glow, MD    Chief Complaint  Patient presents with   Abdominal Pain   Emesis    Brief Narrative:  Shawna Anderson is an 55 y.o. female with medical history significant for CAD s/p PCI, hypertension, asthma/COPD, OSA not on CPAP, history of esophageal dysphagia with Schatzki's ring, s/p sigmoid colon resection for bowel perforation who presents with persistent nausea, vomiting and abdominal pain.  Found to have hypokalemia and AKI and recurrent acute diverticulitis on CT abdomen and pelvis.       Assessment & Plan:   Principal Problem:   Acute diverticulitis Active Problems:   CAD S/P CFX PCI 2010   Bipolar disorder (HCC)   OSA (obstructive sleep apnea)   Gastroesophageal reflux disease   AKI (acute kidney injury) ([place])   Hypokalemia   Transaminitis   Diverticulitis   Gastroparesis   1 recurrent acute diverticulitis/colitis -Patient noted to have recently admitted from 6/6-12/19/2020 for acute diverticulitis and noted to have been discharged on ciprofloxacin and Flagyl. -Symptoms fluctuating. -Afebrile. -Leukocytosis trending back up and currently at 19.5.  -Abdominal pain slowly improving today as well as distention.   -Patient noted to have worsening abdominal pain, nausea vomiting, hematochezia after smog enema attempted a few days ago.   -Patient being followed by GI who are recommending repeat CT abdomen and pelvis which was done 96/26/2022) showing wall thickening of the left colon from the splenic flexure through the descending colon consistent with diffuse infectious or inflammatory colitis.  More focal inflammation noted at the proximal sigmoid colon on prior study without substantial change.  No abscess noted.  No extraluminal free air. -GI pathogen panel negative. -C. difficile negative. -Scheduled IV Reglan discontinued. -Continue IV Zosyn. -On soft  diet. -GI following and recommending surgical evaluation. -Consult general surgery for further evaluation and management.  2.  Acute kidney injury -Secondary to prerenal azotemia.   -Resolved with hydration.   -Saline lock IV fluids.   3.  Hypokalemia -Likely secondary to GI losses. -Repleted -Potassium at 4.2, magnesium 2.1  4.  Mild transaminitis -Resolved with hydration . -IV fluids have been saline.  5.  History of coronary artery disease -Stable. -Continue Plavix, aspirin, Lipitor, Zetia, Imdur.  6.  Hypertension -Norvasc, Imdur.    7.  GERD Continue PPI twice daily.   8.  Bipolar disorder -Continue home regimen Depakote, trazodone.   -Outpatient follow-up.    9.  Obesity  10.  OSA -Not on CPAP prior to admission.   -Outpatient follow-up.    11.  Constipation -Patient noted the afternoon of 01/04/2021, with complaints of significant abdominal pain, abdominal distention after eating solid meals. -KUB done negative for ileus or small bowel obstruction however did note moderate stool. -Fleets enema ordered with some clinical improvement however no significant results from constipation. -Smog enema ordered which per patient led to hematochezia and activation of the hemorrhoids, significant abdominal pain, ongoing nausea and vomiting throughout the night. -Patient noted to have refused magnesium citrate. -Continue current bowel regimen of Senokot-S twice daily, MiraLAX twice daily.  12.  Minimal- mild gastroparesis -Patient underwent gastric emptying study (07/20/2020) with slightly delayed late phase gastric emptying at 3 and 4 hours. -Patient with bloating, abdominal distention after solid food intake on 01/04/2021. -Patient started on IV Reglan per GI recommendations which has subsequently been discontinued due to concerns for constipation.  -Outpatient follow-up with GI.  13.  Hematochezia -Patient stated started after smog enema given was onset of  hematochezia. -Initial concern was from hemorrhoidal bleed from trauma from enema. -Hematochezia improving. -Repeat CT abdomen and pelvis with wall thickening of the left colon from splenic flexure through the descending colon consistent with diffuse infectious or inflammatory colitis.??  Ischemic colitis -H&H stable. -Continue Anusol suppositories. -GI following.    DVT prophylaxis: Heparin>>>> Lovenox Code Status: Full Family Communication: Updated patient.  No family at bedside.  Disposition:   Status is: Inpatient  Remains inpatient appropriate because:IV treatments appropriate due to intensity of illness or inability to take PO  Dispo: The patient is from: Home              Anticipated d/c is to: Home              Patient currently is not medically stable to d/c.   Difficult to place patient No       Consultants:  Gastroenterology: Gerarda Gunther ,PA 01/02/2021 General surgery pending  Procedures:  CT abdomen and pelvis 12/31/2020, 01/06/2021    Antimicrobials: IV Zosyn 12/31/2020>>>>   Subjective: Small amout of BRBPR with brown stool. Abdominal pain improving. NO SOB, No CP.  Stated able to tolerate breakfast this morning.  Objective: Vitals:   01/06/21 2231 01/06/21 2331 01/07/21 0608 01/07/21 0719  BP: 124/81 (!) 150/78 112/65 110/70  Pulse: 88 80 90 84  Resp: 20 20 16 15   Temp: 98.6 F (37 C) 98.3 F (36.8 C) 98.4 F (36.9 C) 97.9 F (36.6 C)  TempSrc: Oral   Oral  SpO2: 99% 99% 96% 99%  Weight:      Height:        Intake/Output Summary (Last 24 hours) at 01/07/2021 1119 Last data filed at 01/07/2021 0951 Gross per 24 hour  Intake 1299.96 ml  Output --  Net 1299.96 ml    Filed Weights   12/31/20 1517  Weight: 87 kg    Examination:  General exam: : NAD Respiratory system: Lungs clear to auscultation bilaterally.  No wheezes, no crackles, no rhonchi.  Normal respiratory effort.  Cardiovascular system: Regular rate and rhythm no murmurs  rubs or gallops.  No JVD.  No lower extremity edema.  Gastrointestinal system: Abdomen soft, less distended, some tenderness to palpation left lower quadrant, positive bowel sounds.  No rebound.  No guarding. Central nervous system: Alert and oriented. No focal neurological deficits. Extremities: Symmetric 5 x 5 power. Skin: No rashes, lesions or ulcers Psychiatry: Judgement and insight appear normal. Mood & affect appropriate.  Data Reviewed: I have personally reviewed following labs and imaging studies  CBC: Recent Labs  Lab 01/03/21 0510 01/04/21 1610 01/05/21 0825 01/06/21 0612 01/07/21 0659  WBC 10.3 10.2 13.4* 24.1* 20.2*  NEUTROABS 4.4  --  7.3 18.7* 13.6*  HGB 10.4* 10.8* 10.7* 11.6* 10.7*  HCT 30.9* 31.3* 31.8* 33.4* 31.8*  MCV 89.8 89.2 91.4 90.8 91.6  PLT 397 478* 533* 620* 621*     Basic Metabolic Panel: Recent Labs  Lab 12/31/20 1757 01/01/21 0422 01/03/21 0510 01/04/21 9604 01/05/21 0825 01/06/21 0612 01/07/21 0659  NA  --    < > 140 139 140 138 138  K  --    < > 3.7 4.0 4.2 3.9 4.2  CL  --    < > 103 102 105 105 104  CO2  --    < > 29 28 27 26 28   GLUCOSE  --    < > 112* 122*  124* 132* 109*  BUN  --    < > <5* <5* 7 10 6   CREATININE  --    < > 0.79 0.79 0.75 0.79 0.77  CALCIUM  --    < > 8.6* 9.4 8.9 9.2 8.4*  MG 2.4  --  1.9  --  2.0 1.7 2.1   < > = values in this interval not displayed.     GFR: Estimated Creatinine Clearance: 85 mL/min (by C-G formula based on SCr of 0.77 mg/dL).  Liver Function Tests: Recent Labs  Lab 12/31/20 1515 01/01/21 0422 01/03/21 0510  AST 50* 35 19  ALT 104* 75* 43  ALKPHOS 79 60 61  BILITOT 0.6 0.6 0.4  PROT 7.4 6.0* 5.7*  ALBUMIN 4.2 3.3* 3.1*     CBG: No results for input(s): GLUCAP in the last 168 hours.   Recent Results (from the past 240 hour(s))  CULTURE, BLOOD (ROUTINE X 2) w Reflex to ID Panel     Status: None   Collection Time: 12/31/20  7:19 PM   Specimen: BLOOD  Result Value Ref Range  Status   Specimen Description BLOOD RIGHT ANTECUBITAL  Final   Special Requests   Final    BOTTLES DRAWN AEROBIC AND ANAEROBIC Blood Culture adequate volume   Culture   Final    NO GROWTH 5 DAYS Performed at [facility], [address] [place], [place] [address]    Report Status 01/05/2021 FINAL  Final  CULTURE, BLOOD (ROUTINE X 2) w Reflex to ID Panel     Status: None   Collection Time: 12/31/20  7:25 PM   Specimen: BLOOD  Result Value Ref Range Status   Specimen Description BLOOD BLOOD RIGHT HAND  Final   Special Requests   Final    BOTTLES DRAWN AEROBIC AND ANAEROBIC Blood Culture adequate volume   Culture   Final    NO GROWTH 5 DAYS Performed at [facility], [address] [place], [place] [address]    Report Status 01/05/2021 FINAL  Final  Resp Panel by RT-PCR (Flu A&B, Covid) Nasopharyngeal Swab     Status: None   Collection Time: 12/31/20  9:13 PM   Specimen: Nasopharyngeal Swab; Nasopharyngeal(NP) swabs in vial transport medium  Result Value Ref Range Status   SARS Coronavirus 2 by RT PCR NEGATIVE NEGATIVE Final    Comment: (NOTE) SARS-CoV-2 target nucleic acids are NOT DETECTED.  The SARS-CoV-2 RNA is generally detectable in upper respiratory specimens during the acute phase of infection. The lowest concentration of SARS-CoV-2 viral copies this assay can detect is 138 copies/mL. A negative result does not preclude SARS-Cov-2 infection and should not be used as the sole basis for treatment or other patient management decisions. A negative result may occur with  improper specimen collection/handling, submission of specimen other than nasopharyngeal swab, presence of viral mutation(s) within the areas targeted by this assay, and inadequate number of viral copies(<138 copies/mL). A negative result must be combined with clinical observations, patient history, and epidemiological information. The expected result is Negative.  Fact Sheet for  Patients:  EntrepreneurPulse.com.au  Fact Sheet for Healthcare Providers:  IncredibleEmployment.be  This test is no t yet approved or cleared by the [place] FDA and  has been authorized for detection and/or diagnosis of SARS-CoV-2 by FDA under an Emergency Use Authorization (EUA). This EUA will remain  in effect (meaning this test can be used) for the duration of the COVID-19 declaration under Section 564(b)(1) of the  Act, 21 U.S.C.section 360bbb-3(b)(1), unless the authorization is terminated  or revoked sooner.       Influenza A by PCR NEGATIVE NEGATIVE Final   Influenza B by PCR NEGATIVE NEGATIVE Final    Comment: (NOTE) The Xpert Xpress SARS-CoV-2/FLU/RSV plus assay is intended as an aid in the diagnosis of influenza from Nasopharyngeal swab specimens and should not be used as a sole basis for treatment. Nasal washings and aspirates are unacceptable for Xpert Xpress SARS-CoV-2/FLU/RSV testing.  Fact Sheet for Patients: EntrepreneurPulse.com.au  Fact Sheet for Healthcare Providers: IncredibleEmployment.be  This test is not yet approved or cleared by the [place] FDA and has been authorized for detection and/or diagnosis of SARS-CoV-2 by FDA under an Emergency Use Authorization (EUA). This EUA will remain in effect (meaning this test can be used) for the duration of the COVID-19 declaration under Section 564(b)(1) of the Act, 21 U.S.C. section 360bbb-3(b)(1), unless the authorization is terminated or revoked.  Performed at [facility], [address] [place], [place] [address]   Gastrointestinal Panel by PCR , Stool     Status: None   Collection Time: 01/06/21  9:23 PM   Specimen: Stool  Result Value Ref Range Status   Campylobacter species NOT DETECTED NOT DETECTED Final   Plesimonas shigelloides NOT DETECTED NOT DETECTED Final   Salmonella species NOT DETECTED NOT DETECTED  Final   Yersinia enterocolitica NOT DETECTED NOT DETECTED Final   Vibrio species NOT DETECTED NOT DETECTED Final   Vibrio cholerae NOT DETECTED NOT DETECTED Final   Enteroaggregative E coli (EAEC) NOT DETECTED NOT DETECTED Final   Enteropathogenic E coli (EPEC) NOT DETECTED NOT DETECTED Final   Enterotoxigenic E coli (ETEC) NOT DETECTED NOT DETECTED Final   Shiga like toxin producing E coli (STEC) NOT DETECTED NOT DETECTED Final   Shigella/Enteroinvasive E coli (EIEC) NOT DETECTED NOT DETECTED Final   Cryptosporidium NOT DETECTED NOT DETECTED Final   Cyclospora cayetanensis NOT DETECTED NOT DETECTED Final   Entamoeba histolytica NOT DETECTED NOT DETECTED Final   Giardia lamblia NOT DETECTED NOT DETECTED Final   Adenovirus F40/41 NOT DETECTED NOT DETECTED Final   Astrovirus NOT DETECTED NOT DETECTED Final   Norovirus GI/GII NOT DETECTED NOT DETECTED Final   Rotavirus A NOT DETECTED NOT DETECTED Final   Sapovirus (I, II, IV, and V) NOT DETECTED NOT DETECTED Final    Comment: Performed at [facility], [address] [place], [place] [address]  C Difficile Quick Screen (NO PCR Reflex)     Status: None   Collection Time: 01/06/21  9:23 PM   Specimen: Stool  Result Value Ref Range Status   C Diff antigen NEGATIVE NEGATIVE Final   C Diff toxin NEGATIVE NEGATIVE Final   C Diff interpretation No C. difficile detected.  Final    Comment: Performed at [facility], [address] [place], [place] [address]          Radiology Studies: CT ABDOMEN PELVIS W CONTRAST  Result Date: 01/06/2021 CLINICAL DATA:  55 y.o. female with medical history significant for CAD s/p PCI, hypertension, asthma/COPD, OSA not on CPAP, history of esophageal dysphagia with Schatzki's ring, s/p sigmoid colon resection for bowel perforation who presents with persistent nausea, vomiting and abdominal pain. Found to have hypokalemia and AKI and recurrent acute diverticulitis on CT abdomen and  pelvis. EXAM: CT ABDOMEN AND PELVIS WITH CONTRAST TECHNIQUE: Multidetector CT imaging of the abdomen and pelvis was performed using the standard protocol following bolus administration of  intravenous contrast. CONTRAST:  15mL OMNIPAQUE IOHEXOL 300 MG/ML  SOLN COMPARISON:  12/31/2020 FINDINGS: Lower chest: Minor linear subsegmental atelectasis, lower lobes. Lung bases otherwise clear. Hepatobiliary: No focal liver abnormality is seen. No gallstones, gallbladder wall thickening, or biliary dilatation. Pancreas: Unremarkable. No pancreatic ductal dilatation or surrounding inflammatory changes. Spleen: Normal in size without focal abnormality. Adrenals/Urinary Tract: Adrenal glands are unremarkable. Kidneys are normal, without renal calculi, focal lesion, or hydronephrosis. Bladder is unremarkable. Stomach/Bowel: There is colonic wall thickening with mild adjacent inflammatory haziness extending from the sigmoid flexure through the descending colon. There is mild wall thickening just above the level of the sigmoid colon anastomosis. Sigmoid colon below this is decompressed. There is a small outpouching from the sigmoid colon just distal to its anastomosis, demonstrating vascular clips and a calcification, similar to the prior CT. There is no other area of colonic wall thickening or inflammation. Right colon is mildly distended with stool. Normal stomach and small bowel. Vascular/Lymphatic: Aortic atherosclerosis. No enlarged lymph nodes. Reproductive: Status post hysterectomy. No adnexal masses. Other: No abdominal wall hernia.  No ascites. Musculoskeletal: No acute or significant osseous findings. IMPRESSION: 1. Wall thickening of the left colon, from the splenic flexure through the descending colon, consistent with diffuse infectious or inflammatory colitis. More focal inflammation noted the proximal sigmoid colon on the prior study is without substantial change. There are no fluid collections to suggest an abscess.  There is no extraluminal or free air. 2. No other acute abnormality within the abdomen or pelvis. Electronically Signed   By: Lajean Manes M.D.   On: 01/06/2021 14:04        Scheduled Meds:  amLODipine  10 mg Oral Daily   aspirin  81 mg Oral Daily   atorvastatin  80 mg Oral q1800   clopidogrel  75 mg Oral Daily   divalproex  1,000 mg Oral Daily   enoxaparin (LOVENOX) injection  40 mg Subcutaneous Q24H   ezetimibe  10 mg Oral Daily   furosemide  40 mg Intravenous Once   hydrocortisone  25 mg Rectal BID   isosorbide mononitrate  60 mg Oral Daily   mometasone-formoterol  2 puff Inhalation BID   pantoprazole (PROTONIX) IV  40 mg Intravenous Q12H   polyethylene glycol  17 g Oral BID   senna-docusate  1 tablet Oral BID   simethicone  160 mg Oral QID   tiotropium  18 mcg Inhalation Daily   Continuous Infusions:  sodium chloride 10 mL/hr at 01/07/21 0348   piperacillin-tazobactam (ZOSYN)  IV 3.375 g (01/07/21 0415)   promethazine (PHENERGAN) injection (IM or IVPB) Stopped (01/06/21 0425)     LOS: 6 days    Time spent: 40 minutes    Irine Seal, MD [facility]   To contact the attending provider between 7A-7P or the covering provider during after hours 7P-7A, please log into the web site [email or web address] and access using universal  password for that web site. If you do not have the password, please call the hospital operator.  01/07/2021, 11:19 AM

## 2021-01-07 NOTE — Consult Note (Addendum)
[facility] SURGICAL CONSULTATION NOTE (initial) - cpt: [id number]   HISTORY OF PRESENT ILLNESS (HPI):  55 y.o. female who was admitted to [facility] on 06/20 secondary to acute uncomplicated diverticulitis. She was started on Zosyn. She does have a recent admission for similar on 06/06 - 06/08, and again this was uncomplicated diverticulitis. She reports a history of similar in her 1's and appears she did have a exploratory laparotomy for perforated diverticulitis in 1995. She denied ever having a colostomy for this. She reports that since then, she had very minimal problems with diverticulitis until earlier this month. Additionally, it does appear she has significant gastroparesis, and she is mostly frustrated by her inability to tolerate PO without distension, nausea, and emesis. She had been getting Reglan for this but this was discontinued over concerns for constipation. No fever, chills. She is currently on Zosyn. She did have a leukocytosis to 24K on 06/26 but this is improved to 19.5K. Renal function has remained normal over last 5 hospital days. No significant electrolyte abnormalities. Most recent CT Abdomen/Pelvis on 06/26 concerning for uncomplicated diverticulitis vs colitis. Currently on a soft diet.   Surgery is consulted by hospitalist physician Dr. Irine Seal, MD in this context for evaluation and management of recurrent vs unresolved uncomplicated diverticulitis vs colitis.   PAST MEDICAL HISTORY (PMH):  Past Medical History:  Diagnosis Date   Anxiety    Asthma    Bipolar 1 disorder ([place])    Bipolar disorder ([place])    CAD (coronary artery disease)    s/p stent BMS OM Cx   Cervical herniated disc 04/12/2016   COPD (chronic obstructive pulmonary disease) (HCC)    Depression    Diabetes mellitus without complication (HCC)    Diverticulitis    GERD (gastroesophageal reflux disease)    Glaucoma    History of blood transfusion    Hyperlipidemia    Hypertension     OSA (obstructive sleep apnea)    not using cpap    Plantar fasciitis    b/l feet s/p steroid shots w/o help and left surgery w/o help    UTI (urinary tract infection)      PAST SURGICAL HISTORY ([place]):  Past Surgical History:  Procedure Laterality Date   ABDOMINAL HYSTERECTOMY     ABDOMINAL SURGERY  1995   Bowel resection.   CARDIAC CATHETERIZATION N/A 04/14/2016   Procedure: Left Heart Cath and Coronary Angiography;  Surgeon: Burnell Blanks, MD;  Location: [place] [facility];  Service: Cardiovascular;  Laterality: N/A;   CARDIAC SURGERY     COLONOSCOPY WITH PROPOFOL N/A 07/11/2019   Procedure: COLONOSCOPY WITH PROPOFOL;  Surgeon: Jonathon Bellows, MD;  Location: [facility] ENDOSCOPY;  Service: Gastroenterology;  Laterality: N/A;   COLONOSCOPY WITH PROPOFOL N/A 07/29/2019   Procedure: COLONOSCOPY WITH PROPOFOL;  Surgeon: Jonathon Bellows, MD;  Location: [facility] ENDOSCOPY;  Service: Gastroenterology;  Laterality: N/A;   CORONARY ANGIOPLASTY WITH STENT PLACEMENT  2010   Drug eluting stent   ESOPHAGOGASTRODUODENOSCOPY (EGD) WITH PROPOFOL N/A 07/11/2019   Procedure: ESOPHAGOGASTRODUODENOSCOPY (EGD) WITH PROPOFOL;  Surgeon: Jonathon Bellows, MD;  Location: [facility] ENDOSCOPY;  Service: Gastroenterology;  Laterality: N/A;   LEFT HEART CATH AND CORONARY ANGIOGRAPHY Left 09/11/2020   Procedure: LEFT HEART CATH AND CORONARY ANGIOGRAPHY;  Surgeon: Nelva Bush, MD;  Location: [place] [facility];  Service: Cardiovascular;  Laterality: Left;   OVARIAN CYST REMOVAL       MEDICATIONS:  Prior to Admission medications   Medication Sig Start Date End Date  Taking? Authorizing Provider  albuterol (VENTOLIN HFA) 108 (90 Base) MCG/ACT inhaler Inhale 1-2 puffs into the lungs every 6 (six) hours as needed for wheezing or shortness of breath. 12/05/20  Yes McLean-Scocuzza, Nino Glow, MD  amLODipine (NORVASC) 10 MG tablet Take 1 tablet (10 mg total) by mouth at bedtime. 04/16/20  Yes McLean-Scocuzza, Nino Glow, MD  aspirin 81 MG  chewable tablet Chew 81 mg by mouth daily.   Yes [provider]  atorvastatin (LIPITOR) 80 MG tablet Take 1 tablet (80 mg total) by mouth daily at 6 PM. 04/16/20 12/31/20 Yes McLean-Scocuzza, Nino Glow, MD  Budeson-Glycopyrrol-Formoterol (BREZTRI AEROSPHERE) 160-9-4.8 MCG/ACT AERO Inhale 2 puffs into the lungs in the morning and at bedtime. Rinse 12/05/20  Yes McLean-Scocuzza, Nino Glow, MD  clopidogrel (PLAVIX) 75 MG tablet Take 1 tablet (75 mg total) by mouth daily. 04/16/20  Yes McLean-Scocuzza, Nino Glow, MD  divalproex (DEPAKOTE) 500 MG DR tablet TAKE 1 TABLET BY MOUTH  TWICE DAILY Patient taking differently: Take 1,000 mg by mouth daily. 08/16/20  Yes McLean-Scocuzza, Nino Glow, MD  Evolocumab (REPATHA SURECLICK) 737 MG/ML SOAJ Inject 1 mL into the skin every 14 (fourteen) days. 10/30/20 01/28/21 Yes End, Harrell Gave, MD  ezetimibe (ZETIA) 10 MG tablet TAKE 1 TABLET BY MOUTH  DAILY WITH LIPITOR 80 MG AT NIGHT 11/26/20  Yes McLean-Scocuzza, Nino Glow, MD  isosorbide mononitrate (IMDUR) 60 MG 24 hr tablet Take 1 tablet (60 mg total) by mouth daily. 09/19/20 03/18/21 Yes Loel Dubonnet, NP  methocarbamol (ROBAXIN) 500 MG tablet Take 1 tablet (500 mg total) by mouth 2 (two) times daily as needed for muscle spasms. 12/03/20  Yes McLean-Scocuzza, Nino Glow, MD  nitroGLYCERIN (NITROSTAT) 0.4 MG SL tablet DISSOLVE 1 TABLET UNDER  TONGUE EVERY 5 MINUTES AS  NEEDED FOR CHEST PAIN MAX  OF 3 TABLETS IN 15 MINUTES  . CALL 911 AFTER THIRD DOSE 11/26/20  Yes Loel Dubonnet, NP  pantoprazole (PROTONIX) 40 MG tablet TAKE 1 TABLET BY MOUTH  TWICE DAILY BEFORE MEALS Patient taking differently: Take 40 mg by mouth 2 (two) times daily before a meal. 01/31/20  Yes Jonathon Bellows, MD  polyethylene glycol (MIRALAX / GLYCOLAX) 17 g packet Take 17 g by mouth daily. 12/20/20 01/19/21 Yes Nolberto Hanlon, MD  traZODone (DESYREL) 150 MG tablet Take 1 tablet (150 mg total) by mouth at bedtime as needed for sleep. 04/16/20  Yes McLean-Scocuzza,  Nino Glow, MD  Vitamin D, Cholecalciferol, 25 MCG (1000 UT) CAPS Take 1 capsule by mouth daily.   Yes [provider]  icosapent Ethyl (VASCEPA) 1 g capsule Take 2 capsules (2 g total) by mouth 2 (two) times daily. 01/01/21   Pavero, Harrell Gave, RPH     ALLERGIES:  Allergies  Allergen Reactions   Ativan [Lorazepam]     Pt states it makes her tongue do weird things    Augmentin [Amoxicillin-Pot Clavulanate]     Upset stomach    Latex Other (See Comments)    Patient stated that she was told by her doctor that she is "allergic to" this   Tape Other (See Comments)    Patient stated that she was told by her doctor that she is "allergic to" this   Xifaxan [Rifaximin]     Pt believes this is contributing to her abdominal pain/discomfort   Drixoral Allergy Sinus [Dexbromphen-Pse-Apap Er] Rash     SOCIAL HISTORY:  Social History   Socioeconomic History   Marital status: Single    Spouse name:  Not on file   Number of children: 1   Years of education: Not on file   Highest education level: Not on file  Occupational History   Not on file  Tobacco Use   Smoking status: Every Day    Packs/day: 1.00    Years: 30.00    Pack years: 30.00    Types: Cigarettes    Last attempt to quit: 03/22/2020    Years since quitting: 0.7   Smokeless tobacco: Never   Tobacco comments:    0.5 PPD 10/02/2020  Vaping Use   Vaping Use: Never used  Substance and Sexual Activity   Alcohol use: Not Currently    Comment: once a year   Drug use: Yes    Frequency: 7.0 times per week    Types: Marijuana   Sexual activity: Not on file  Other Topics Concern   Not on file  Social History Narrative   From [place] now living in [place]    1 son    No guns    Wears seat belt   Safe in relationship    Social Determinants of Health   Financial Resource Strain: Low Risk    Difficulty of Paying Living Expenses: Not very hard  Food Insecurity: No Food Insecurity   Worried About Paediatric nurse in the Last Year: Never true   Arboriculturist in the Last Year: Never true  Transportation Needs: No Transportation Needs   Lack of Transportation (Medical): No   Lack of Transportation (Non-Medical): No  Physical Activity: Not on file  Stress: No Stress Concern Present   Feeling of Stress : Not at all  Social Connections: Unknown   Frequency of Communication with Friends and Family: More than three times a week   Frequency of Social Gatherings with Friends and Family: More than three times a week   Attends Religious Services: Not on Electrical engineer or Organizations: Not on file   Attends Archivist Meetings: Not on file   Marital Status: Not on file  Intimate Partner Violence: Not At Risk   Fear of Current or Ex-Partner: No   Emotionally Abused: No   Physically Abused: No   Sexually Abused: No     FAMILY HISTORY:  Family History  Problem Relation Age of Onset   CAD Mother    Depression Mother    Heart disease Mother    Hyperlipidemia Mother    Hypertension Mother    CAD Brother    Depression Brother    Diabetes Brother    Heart disease Brother    Hyperlipidemia Brother    Heart disease Father    Alcohol abuse Father    Lupus Other    Sickle cell anemia Other       REVIEW OF SYSTEMS:  Review of Systems  Constitutional:  Negative for chills and fever.  Respiratory:  Negative for cough and shortness of breath.   Cardiovascular:  Negative for chest pain and palpitations.  Gastrointestinal:  Positive for abdominal pain, nausea and vomiting. Negative for constipation and diarrhea.  Genitourinary:  Negative for dysuria and urgency.  All other systems reviewed and are negative.  VITAL SIGNS:  Temp:  [97.9 F (36.6 C)-98.6 F (37 C)] 98.2 F (36.8 C) (06/27 1331) Pulse Rate:  [80-90] 89 (06/27 1331) Resp:  [15-20] 15 (06/27 1331) BP: (110-150)/(65-81) 129/81 (06/27 1331) SpO2:  [96 %-99 %] 99 % (06/27 0719)  Height: 5\' 3"  (160  cm) Weight: 87 kg BMI (Calculated): 33.98   INTAKE/OUTPUT:  06/26 0701 - 06/27 0700 In: 1610 [P.O.:1100; I.V.:32.5; IV Piggyback:547.4] Out: -   PHYSICAL EXAM:  Physical Exam Vitals and nursing note reviewed. Exam conducted with a chaperone present.  Constitutional:      General: She is not in acute distress.    Appearance: She is well-developed. She is obese. She is not ill-appearing.  HENT:     Head: Normocephalic and atraumatic.  Eyes:     General: No scleral icterus.    Extraocular Movements: Extraocular movements intact.  Cardiovascular:     Rate and Rhythm: Normal rate and regular rhythm.     Heart sounds: Normal heart sounds.  Pulmonary:     Effort: Pulmonary effort is normal. No respiratory distress.  Abdominal:     General: A surgical scar is present. There is distension (Subjective).     Palpations: Abdomen is soft.     Tenderness: There is no abdominal tenderness. There is no guarding or rebound.     Comments: Abdomen is soft, non-tender, non-distended, no rebound/guarding. She does have ecchymosis in the lower abdomen, which I suspect is secondary to DVT prophylaxis   Genitourinary:    Comments: Deferred Skin:    General: Skin is warm and dry.  Neurological:     General: No focal deficit present.     Mental Status: She is alert and oriented to person, place, and time.  Psychiatric:        Mood and Affect: Mood normal.        Behavior: Behavior normal.     Labs:  CBC Latest Ref Rng & Units 01/07/2021 01/07/2021 01/06/2021  WBC 4.0 - 10.5 K/uL 19.5(H) 20.2(H) 24.1(H)  Hemoglobin 12.0 - 15.0 g/dL 10.2(L) 10.7(L) 11.6(L)  Hematocrit 36.0 - 46.0 % 29.8(L) 31.8(L) 33.4(L)  Platelets 150 - 400 K/uL 605(H) 621(H) 620(H)   CMP Latest Ref Rng & Units 01/07/2021 01/06/2021 01/05/2021  Glucose 70 - 99 mg/dL 109(H) 132(H) 124(H)  BUN 6 - 20 mg/dL 6 10 7   Creatinine 0.44 - 1.00 mg/dL 0.77 0.79 0.75  Sodium 135 - 145 mmol/L 138 138 140  Potassium 3.5 - 5.1 mmol/L 4.2 3.9  4.2  Chloride 98 - 111 mmol/L 104 105 105  CO2 22 - 32 mmol/L 28 26 27   Calcium 8.9 - 10.3 mg/dL 8.4(L) 9.2 8.9  Total Protein 6.5 - 8.1 g/dL - - -  Total Bilirubin 0.3 - 1.2 mg/dL - - -  Alkaline Phos 38 - 126 U/L - - -  AST 15 - 41 U/L - - -  ALT 0 - 44 U/L - - -     Imaging studies:   CT Abdomen/Pelvis (01/06/2021) personally reviewed and compared against CTs from 06/06 and 06/20, and agree with radiologist interpretation:  IMPRESSION: 1. Wall thickening of the left colon, from the splenic flexure through the descending colon, consistent with diffuse infectious or inflammatory colitis. More focal inflammation noted the proximal sigmoid colon on the prior study is without substantial change. There are no fluid collections to suggest an abscess. There is no extraluminal or free air. 2. No other acute abnormality within the abdomen or pelvis.   Assessment/Plan: (ICD-10's: K43.92) 55 y.o. female with improving leukocytosis and currently resolved abdominal pain concerning for acute uncomplicated diverticulitis vs colitis with concomitant gastroparesis, which seems to be most bothersome to her.    - No emergent surgical interventions warranted at this time. She is  without perforation, abscess, or peritonitis on examination. This 'episode' of diverticulitis vs colitis throughout June is her only reported episode since her surgery in 1995.   - She does seem to mostly complain about bloating, nausea, and emesis after eating, which GI is following for gastroparesis. Reglan stopped for concerns over constipation. Question any role for erythromycin vs just resuming Reglan   - Okay to continue soft diet as tolerated - Monitor abdominal examination; on-going bowel function - Pain control prn (minimize narcotics); antiemetics prn - Monitor leukocytosis; improving - Further management per primary service; we will follow   - DVT prophylaxis  All of the above findings and recommendations were  discussed with the patient, and all of patient's questions were answered to her expressed satisfaction.  Thank you for the opportunity to participate in this patient's care.   -- Edison Simon, PA-C [facility] 01/07/2021, 4:18 PM [phone] M-F: 7am - 4pm  No indication for surgery at this time. I saw and evaluated the patient.  I agree with the above documentation, exam, and plan, which I have edited where appropriate. Fredirick Maudlin  12:08 PM

## 2021-01-08 DIAGNOSIS — R1032 Left lower quadrant pain: Secondary | ICD-10-CM

## 2021-01-08 LAB — CBC WITH DIFFERENTIAL/PLATELET
Abs Immature Granulocytes: 0.2 10*3/uL — ABNORMAL HIGH (ref 0.00–0.07)
Basophils Absolute: 0.1 10*3/uL (ref 0.0–0.1)
Basophils Relative: 1 %
Eosinophils Absolute: 0.2 10*3/uL (ref 0.0–0.5)
Eosinophils Relative: 2 %
HCT: 31.3 % — ABNORMAL LOW (ref 36.0–46.0)
Hemoglobin: 10.5 g/dL — ABNORMAL LOW (ref 12.0–15.0)
Immature Granulocytes: 1 %
Lymphocytes Relative: 28 %
Lymphs Abs: 4.1 10*3/uL — ABNORMAL HIGH (ref 0.7–4.0)
MCH: 30.4 pg (ref 26.0–34.0)
MCHC: 33.5 g/dL (ref 30.0–36.0)
MCV: 90.7 fL (ref 80.0–100.0)
Monocytes Absolute: 1.4 10*3/uL — ABNORMAL HIGH (ref 0.1–1.0)
Monocytes Relative: 10 %
Neutro Abs: 8.9 10*3/uL — ABNORMAL HIGH (ref 1.7–7.7)
Neutrophils Relative %: 58 %
Platelets: 567 10*3/uL — ABNORMAL HIGH (ref 150–400)
RBC: 3.45 MIL/uL — ABNORMAL LOW (ref 3.87–5.11)
RDW: 14.2 % (ref 11.5–15.5)
Smear Review: NORMAL
WBC: 15 10*3/uL — ABNORMAL HIGH (ref 4.0–10.5)
nRBC: 0 % (ref 0.0–0.2)

## 2021-01-08 LAB — COMPREHENSIVE METABOLIC PANEL
ALT: 24 U/L (ref 0–44)
AST: 14 U/L — ABNORMAL LOW (ref 15–41)
Albumin: 3 g/dL — ABNORMAL LOW (ref 3.5–5.0)
Alkaline Phosphatase: 49 U/L (ref 38–126)
Anion gap: 5 (ref 5–15)
BUN: 6 mg/dL (ref 6–20)
CO2: 30 mmol/L (ref 22–32)
Calcium: 8.4 mg/dL — ABNORMAL LOW (ref 8.9–10.3)
Chloride: 103 mmol/L (ref 98–111)
Creatinine, Ser: 0.81 mg/dL (ref 0.44–1.00)
GFR, Estimated: >60 mL/min (ref >60)
Glucose, Bld: 105 mg/dL — ABNORMAL HIGH (ref 70–99)
Potassium: 4.1 mmol/L (ref 3.5–5.1)
Sodium: 138 mmol/L (ref 135–145)
Total Bilirubin: 0.4 mg/dL (ref 0.3–1.2)
Total Protein: 5.8 g/dL — ABNORMAL LOW (ref 6.5–8.1)

## 2021-01-08 LAB — MAGNESIUM: Magnesium: 2.2 mg/dL (ref 1.7–2.4)

## 2021-01-08 MED ORDER — MAGNESIUM HYDROXIDE 400 MG/5ML PO SUSP
30.0000 mL | Freq: Once | ORAL | Status: AC
  Administered 2021-01-08: 30 mL via ORAL
  Filled 2021-01-08: qty 30

## 2021-01-08 MED ORDER — METOCLOPRAMIDE HCL 5 MG/ML IJ SOLN
10.0000 mg | Freq: Four times a day (QID) | INTRAMUSCULAR | Status: DC | PRN
  Administered 2021-01-08: 10 mg via INTRAVENOUS
  Filled 2021-01-08: qty 2

## 2021-01-08 NOTE — Progress Notes (Signed)
[facility] Progress note  Patient reportedly with nausea and recurrent left lower quadrant pain but decreased white blood cell count and no clinical evidence of worsening. Appears to sleep well at night.  Vitals reviewed and are stable  patient is clinically stable without any objective worsening other than subjective complaints of pain, Until I recommend switching to oral antibiotic therapy for a total of two weeks' co urse.  I appreciate surgical input. Since there is no surgical intervention required, I recommend discharging patient to home and discontinuing narcotics.   Would need to follow up with regular GI doctor in the office within 3 weeks for elective colonoscopy to be arranged after resolution of symptoms.   Robet Leu, M.D. Gastroenterology

## 2021-01-08 NOTE — Progress Notes (Addendum)
[facility] SURGICAL PROGRESS NOTE (cpt [phone])  Hospital Day(s): 7.   Interval History: Patient seen and examined, no acute events or new complaints overnight. Patient reports she is having a rough morning. She woke up feeling bloated and nauseous. She is having some LLQ discomfort as well. She denies fever, chills, emesis. Her leukocytosis has continues to improve; now to 15.0K. renal function remains normal; sCr - 0.81; UO - unmeasured. No electrolyte derangements. She is on soft diet but not eating much, she is having bowel function.   Review of Systems:  Constitutional: denies fever, chills  HEENT: denies cough or congestion  Respiratory: denies any shortness of breath  Cardiovascular: denies chest pain or palpitations  Gastrointestinal: + abdominal pain, + nausea, + distension, denied emesis Genitourinary: denies burning with urination or urinary frequency  Vital signs in last 24 hours: [min-max] current  Temp:  [97.9 F (36.6 C)-99 F (37.2 C)] 98.4 F (36.9 C) (06/28 0522) Pulse Rate:  [66-89] 66 (06/28 0522) Resp:  [15-18] 16 (06/28 0522) BP: (109-154)/(60-84) 109/60 (06/28 0522) SpO2:  [96 %-99 %] 96 % (06/28 0522)     Height: 5\' 3"  (160 cm) Weight: 87 kg BMI (Calculated): 33.98   Intake/Output last 2 shifts:  06/27 0701 - 06/28 0700 In: 1134.8 [P.O.:960; I.V.:15.2; IV Piggyback:159.6] Out: 200 [Emesis/NG output:200]   Physical Exam:  Constitutional: alert, cooperative and no distress  HENT: normocephalic without obvious abnormality  Eyes: PERRL, EOM's grossly intact and symmetric  Respiratory: breathing non-labored at rest  Cardiovascular: regular rate and sinus rhythm  Gastrointestinal: Soft, she is sore in LLQ, distended, no rebound/guarding Integumentary: She does have significant ecchymosis throughout her lower abdomen Musculoskeletal: no edema or wounds, motor and sensation grossly intact, NT    Labs:  CBC Latest Ref Rng & Units 01/08/2021  01/07/2021 01/07/2021  WBC 4.0 - 10.5 K/uL 15.0(H) 19.5(H) 20.2(H)  Hemoglobin 12.0 - 15.0 g/dL 10.5(L) 10.2(L) 10.7(L)  Hematocrit 36.0 - 46.0 % 31.3(L) 29.8(L) 31.8(L)  Platelets 150 - 400 K/uL 567(H) 605(H) 621(H)   CMP Latest Ref Rng & Units 01/08/2021 01/07/2021 01/06/2021  Glucose 70 - 99 mg/dL 105(H) 109(H) 132(H)  BUN 6 - 20 mg/dL 6 6 10   Creatinine 0.44 - 1.00 mg/dL 0.81 0.77 0.79  Sodium 135 - 145 mmol/L 138 138 138  Potassium 3.5 - 5.1 mmol/L 4.1 4.2 3.9  Chloride 98 - 111 mmol/L 103 104 105  CO2 22 - 32 mmol/L 30 28 26   Calcium 8.9 - 10.3 mg/dL 8.4(L) 8.4(L) 9.2  Total Protein 6.5 - 8.1 g/dL 5.8(L) - -  Total Bilirubin 0.3 - 1.2 mg/dL 0.4 - -  Alkaline Phos 38 - 126 U/L 49 - -  AST 15 - 41 U/L 14(L) - -  ALT 0 - 44 U/L 24 - -     Imaging studies: No new pertinent imaging studies   Assessment/Plan: (ICD-10's: K89.92) 55 y.o. female with improving leukocytosis and and LLQ abdominal pain concerning for acute uncomplicated diverticulitis vs colitis with concomitant gastroparesis complaining of significant nausea nd distension this morning, which seems to be most bothersome to her.    - I does seem that her biggest issues this morning again are bloating and nausea, which I do suspect are related to her gastroparesis. She does endorse that eating typically results in the onset of these symptoms even after a few bites. She had been on Reglan earlier in the admission, but this was stopped secondary to concerns for constipation. She is having  bowel movements. Question if there is benefit to restarting pro-kinetic agents (Reglan vs Erythromycin); defer to GI as they have been following her for this.    - From her diverticulitis vs colitis standpoint, No emergent surgical interventions warranted at this time. She is without perforation, abscess, or peritonitis on examination. This 'episode' of diverticulitis vs colitis throughout June is her only reported episode since her surgery in 1995.  She did directly ask about diagnostic laparoscopy today, which I do not think would offer any benefit at this time nor change anything in her plan of care.                           - Okay to continue soft diet as tolerated - Monitor abdominal examination; on-going bowel function - Pain control prn (minimize narcotics); antiemetics prn - Monitor leukocytosis; improving - Further management per primary service; we will follow   All of the above findings and recommendations were discussed with the patient, and the medical team, and all of patient's questions were answered to her expressed satisfaction.  -- Edison Simon, PA-C [facility] 01/08/2021, 7:10 AM [phone] M-F: 7am - 4pm   No indication for surgery.  I saw and evaluated the patient.  I agree with the above documentation, exam, and plan, which I have edited where appropriate. Fredirick Maudlin  12:09 PM

## 2021-01-08 NOTE — Progress Notes (Signed)
Patient continues to complain of bloating. Order received from Dr Alice Reichert for [place]

## 2021-01-08 NOTE — Progress Notes (Signed)
PROGRESS NOTE    Shawna Anderson  ZJQ:[id number] DOB: Apr 27, 1966 DOA: 12/31/2020 PCP: McLean-Scocuzza, Nino Glow, MD    Chief Complaint  Patient presents with   Abdominal Pain   Emesis    Brief Narrative:  Shawna Anderson is an 55 y.o. female with medical history significant for CAD s/p PCI, hypertension, asthma/COPD, OSA not on CPAP, history of esophageal dysphagia with Schatzki's ring, s/p sigmoid colon resection for bowel perforation who presents with persistent nausea, vomiting and abdominal pain.  Found to have hypokalemia and AKI and recurrent acute diverticulitis on CT abdomen and pelvis.   Patient placed empirically on antibiotics.  Patient noted to have worsening abdominal pain with worsening leukocytosis and hematochezia.  Stool studies was negative for C. difficile GI pathogen panel.  Patient on empiric IV Zosyn.  General surgery consulted per GI recommendations.     Assessment & Plan:   Principal Problem:   Acute diverticulitis Active Problems:   CAD S/P CFX PCI 2010   Bipolar disorder (HCC)   OSA (obstructive sleep apnea)   Gastroesophageal reflux disease   AKI (acute kidney injury) ([place])   Hypokalemia   Transaminitis   Diverticulitis   Gastroparesis   1 recurrent acute diverticulitis/colitis -Patient noted to have recently admitted from 6/6-12/19/2020 for acute diverticulitis and noted to have been discharged on ciprofloxacin and Flagyl. -Symptoms fluctuating. -Afebrile. -Leukocytosis trending back up and currently at 15.0. -Abdominal pain slowly improving today as well as distention.   -Patient noted to have worsening abdominal pain, nausea vomiting, hematochezia after smog enema attempted a few days ago.   -Patient being followed by GI who are recommending repeat CT abdomen and pelvis which was done (01/06/2021) showing wall thickening of the left colon from the splenic flexure through the descending colon consistent with diffuse infectious or inflammatory  colitis.  More focal inflammation noted at the proximal sigmoid colon on prior study without substantial change.  No abscess noted.  No extraluminal free air. -GI pathogen panel negative. -C. difficile negative. -Scheduled IV Reglan discontinued early on during the hospitalization by GI and subsequently resumed today as as needed.. -Continue IV Zosyn and likely transition to oral antibiotics once leukocytosis improved significantly versus resolved.  -On soft diet. -GI following and recommended surgical input.   -Patient seen in consultation by general surgery, Dr. Celine Ahr who feel no emergent surgical intervention warranted at this time in terms of patient's diverticulitis versus colitis standpoint, feel most of patient's symptoms are from bloating and nausea and suspect secondary to gastroparesis.   -IV Reglan placed as needed.   -We will discontinue Dilaudid.  -GI and general surgery following.  2.  Acute kidney injury -Secondary to prerenal azotemia.   -Resolved with hydration.   -Saline lock IV fluids.   3.  Hypokalemia -Likely secondary to GI losses. -Repleted -Potassium at 4.1.  Magnesium at 2.2.   -Follow.   4.  Mild transaminitis -Resolved with hydration . -IV fluids discontinued.    5.  History of coronary artery disease -Continue Plavix, aspirin, Lipitor, Zetia, Imdur.    6.  Hypertension -Controlled on current regimen of Imdur, Norvasc.   7.  GERD -PPI   8.  Bipolar disorder -Stable.   -Continue Depakote, trazodone.   -Outpatient follow-up.  9.  Obesity  10.  OSA -Not on CPAP prior to admission.   -Outpatient follow-up.    11.  Constipation -Patient noted the afternoon of 01/04/2021, with complaints of significant abdominal pain, abdominal distention after eating solid meals. -  KUB done negative for ileus or small bowel obstruction however did note moderate stool. -Fleets enema ordered with some clinical improvement however no significant results from  constipation. -Smog enema ordered which per patient led to hematochezia and activation of the hemorrhoids, significant abdominal pain, ongoing nausea and vomiting throughout the night. -Patient noted to have refused magnesium citrate. -Continue current bowel regimen of Senokot-S twice daily, MiraLAX twice daily. -We will give a dose of milk of magnesia.  12.  Minimal- mild gastroparesis -Patient underwent gastric emptying study (07/20/2020) with slightly delayed late phase gastric emptying at 3 and 4 hours. -Patient with bloating, abdominal distention after solid food intake during this hospitalization.. -Patient started on IV Reglan per GI recommendations which has subsequently been discontinued due to concerns for constipation.  -GI resumed IV Reglan as needed. -Outpatient follow-up with GI.  13.  Hematochezia -Patient stated started after smog enema given was onset of hematochezia. -Initial concern was from hemorrhoidal bleed from trauma from enema. -Hematochezia improved.  -Hemoglobin stable at 10.5.  -Repeat CT abdomen and pelvis with wall thickening of the left colon from splenic flexure through the descending colon consistent with diffuse infectious or inflammatory colitis.??  Ischemic colitis -Continue Anusol suppositories.   -GI following.     DVT prophylaxis: Heparin>>>> Lovenox Code Status: Full Family Communication: Updated patient.  No family at bedside.  Disposition:   Status is: Inpatient  Remains inpatient appropriate because:IV treatments appropriate due to intensity of illness or inability to take PO  Dispo: The patient is from: Home              Anticipated d/c is to: Home              Patient currently is not medically stable to d/c.   Difficult to place patient No       Consultants:  Gastroenterology: Gerarda Gunther ,PA 01/02/2021 General surgery: Dr. Celine Ahr 01/07/2021  Procedures:  CT abdomen and pelvis 12/31/2020, 01/06/2021    Antimicrobials: IV  Zosyn 12/31/2020>>>>   Subjective: Patient sleeping but arousable.  States that had some abdominal distention after eating.  Had a bout of nausea and emesis last night.  Complaining of left lower quadrant pain.  Had tiny bowel movement feels the urge to have a bowel movement but no results.  States milk of magnesia helps with bowel movements for her.    Objective: Vitals:   01/07/21 2159 01/08/21 0011 01/08/21 0522 01/08/21 0811  BP: 135/80 (!) 154/84 109/60 (!) 144/90  Pulse: 83 89 66 (!) 103  Resp: 16 18 16 18   Temp: 98.9 F (37.2 C) 99 F (37.2 C) 98.4 F (36.9 C) 98.4 F (36.9 C)  TempSrc: Oral Oral    SpO2: 97% 99% 96% 96%  Weight:      Height:        Intake/Output Summary (Last 24 hours) at 01/08/2021 1045 Last data filed at 01/08/2021 0900 Gross per 24 hour  Intake 1614.77 ml  Output 200 ml  Net 1414.77 ml    Filed Weights   12/31/20 1517  Weight: 87 kg    Examination:  General exam: : NAD Respiratory system: CTA B.  No wheezes, no crackles, no rhonchi.  Normal respiratory effort.  Cardiovascular system: RRR no murmurs rubs or gallops.  No JVD.  No lower extremity edema. Gastrointestinal system: Distended, soft, tender to palpation left lower quadrant, positive bowel sounds.  No rebound.  No guarding.  Central nervous system: Alert and oriented.  Moving extremities spontaneously.  No focal neurological deficits.   Extremities: Symmetric 5 x 5 power. Skin: No rashes, lesions or ulcers Psychiatry: Judgement and insight appear normal. Mood & affect appropriate.  Data Reviewed: I have personally reviewed following labs and imaging studies  CBC: Recent Labs  Lab 01/05/21 0825 01/06/21 0612 01/07/21 0659 01/07/21 1405 01/08/21 0511  WBC 13.4* 24.1* 20.2* 19.5* 15.0*  NEUTROABS 7.3 18.7* 13.6* 13.3* 8.9*  HGB 10.7* 11.6* 10.7* 10.2* 10.5*  HCT 31.8* 33.4* 31.8* 29.8* 31.3*  MCV 91.4 90.8 91.6 91.4 90.7  PLT 533* 620* 621* 605* 567*     Basic Metabolic  Panel: Recent Labs  Lab 01/03/21 0510 01/04/21 2353 01/05/21 0825 01/06/21 0612 01/07/21 0659 01/08/21 0511  NA 140 139 140 138 138 138  K 3.7 4.0 4.2 3.9 4.2 4.1  CL 103 102 105 105 104 103  CO2 29 28 27 26 28 30   GLUCOSE 112* 122* 124* 132* 109* 105*  BUN <5* <5* 7 10 6 6   CREATININE 0.79 0.79 0.75 0.79 0.77 0.81  CALCIUM 8.6* 9.4 8.9 9.2 8.4* 8.4*  MG 1.9  --  2.0 1.7 2.1 2.2     GFR: Estimated Creatinine Clearance: 83.9 mL/min (by C-G formula based on SCr of 0.81 mg/dL).  Liver Function Tests: Recent Labs  Lab 01/03/21 0510 01/08/21 0511  AST 19 14*  ALT 43 24  ALKPHOS 61 49  BILITOT 0.4 0.4  PROT 5.7* 5.8*  ALBUMIN 3.1* 3.0*     CBG: No results for input(s): GLUCAP in the last 168 hours.   Recent Results (from the past 240 hour(s))  CULTURE, BLOOD (ROUTINE X 2) w Reflex to ID Panel     Status: None   Collection Time: 12/31/20  7:19 PM   Specimen: BLOOD  Result Value Ref Range Status   Specimen Description BLOOD RIGHT ANTECUBITAL  Final   Special Requests   Final    BOTTLES DRAWN AEROBIC AND ANAEROBIC Blood Culture adequate volume   Culture   Final    NO GROWTH 5 DAYS Performed at [facility], [address] [place], [place] [address]    Report Status 01/05/2021 FINAL  Final  CULTURE, BLOOD (ROUTINE X 2) w Reflex to ID Panel     Status: None   Collection Time: 12/31/20  7:25 PM   Specimen: BLOOD  Result Value Ref Range Status   Specimen Description BLOOD BLOOD RIGHT HAND  Final   Special Requests   Final    BOTTLES DRAWN AEROBIC AND ANAEROBIC Blood Culture adequate volume   Culture   Final    NO GROWTH 5 DAYS Performed at [facility], [address] [place], [place] [address]    Report Status 01/05/2021 FINAL  Final  Resp Panel by RT-PCR (Flu A&B, Covid) Nasopharyngeal Swab     Status: None   Collection Time: 12/31/20  9:13 PM   Specimen: Nasopharyngeal Swab; Nasopharyngeal(NP) swabs in vial transport medium  Result  Value Ref Range Status   SARS Coronavirus 2 by RT PCR NEGATIVE NEGATIVE Final    Comment: (NOTE) SARS-CoV-2 target nucleic acids are NOT DETECTED.  The SARS-CoV-2 RNA is generally detectable in upper respiratory specimens during the acute phase of infection. The lowest concentration of SARS-CoV-2 viral copies this assay can detect is 138 copies/mL. A negative result does not preclude SARS-Cov-2 infection and should not be used as the sole basis for treatment or other patient management decisions. A negative result may occur with  improper specimen collection/handling,  submission of specimen other than nasopharyngeal swab, presence of viral mutation(s) within the areas targeted by this assay, and inadequate number of viral copies(<138 copies/mL). A negative result must be combined with clinical observations, patient history, and epidemiological information. The expected result is Negative.  Fact Sheet for Patients:  EntrepreneurPulse.com.au  Fact Sheet for Healthcare Providers:  IncredibleEmployment.be  This test is no t yet approved or cleared by the [place] FDA and  has been authorized for detection and/or diagnosis of SARS-CoV-2 by FDA under an Emergency Use Authorization (EUA). This EUA will remain  in effect (meaning this test can be used) for the duration of the COVID-19 declaration under Section 564(b)(1) of the Act, 21 U.S.C.section 360bbb-3(b)(1), unless the authorization is terminated  or revoked sooner.       Influenza A by PCR NEGATIVE NEGATIVE Final   Influenza B by PCR NEGATIVE NEGATIVE Final    Comment: (NOTE) The Xpert Xpress SARS-CoV-2/FLU/RSV plus assay is intended as an aid in the diagnosis of influenza from Nasopharyngeal swab specimens and should not be used as a sole basis for treatment. Nasal washings and aspirates are unacceptable for Xpert Xpress SARS-CoV-2/FLU/RSV testing.  Fact Sheet for  Patients: EntrepreneurPulse.com.au  Fact Sheet for Healthcare Providers: IncredibleEmployment.be  This test is not yet approved or cleared by the [place] FDA and has been authorized for detection and/or diagnosis of SARS-CoV-2 by FDA under an Emergency Use Authorization (EUA). This EUA will remain in effect (meaning this test can be used) for the duration of the COVID-19 declaration under Section 564(b)(1) of the Act, 21 U.S.C. section 360bbb-3(b)(1), unless the authorization is terminated or revoked.  Performed at [facility], [address] [place], [place] [address]   Gastrointestinal Panel by PCR , Stool     Status: None   Collection Time: 01/06/21  9:23 PM   Specimen: Stool  Result Value Ref Range Status   Campylobacter species NOT DETECTED NOT DETECTED Final   Plesimonas shigelloides NOT DETECTED NOT DETECTED Final   Salmonella species NOT DETECTED NOT DETECTED Final   Yersinia enterocolitica NOT DETECTED NOT DETECTED Final   Vibrio species NOT DETECTED NOT DETECTED Final   Vibrio cholerae NOT DETECTED NOT DETECTED Final   Enteroaggregative E coli (EAEC) NOT DETECTED NOT DETECTED Final   Enteropathogenic E coli (EPEC) NOT DETECTED NOT DETECTED Final   Enterotoxigenic E coli (ETEC) NOT DETECTED NOT DETECTED Final   Shiga like toxin producing E coli (STEC) NOT DETECTED NOT DETECTED Final   Shigella/Enteroinvasive E coli (EIEC) NOT DETECTED NOT DETECTED Final   Cryptosporidium NOT DETECTED NOT DETECTED Final   Cyclospora cayetanensis NOT DETECTED NOT DETECTED Final   Entamoeba histolytica NOT DETECTED NOT DETECTED Final   Giardia lamblia NOT DETECTED NOT DETECTED Final   Adenovirus F40/41 NOT DETECTED NOT DETECTED Final   Astrovirus NOT DETECTED NOT DETECTED Final   Norovirus GI/GII NOT DETECTED NOT DETECTED Final   Rotavirus A NOT DETECTED NOT DETECTED Final   Sapovirus (I, II, IV, and V) NOT DETECTED NOT DETECTED Final     Comment: Performed at [facility], [address] [place], [place] [address]  C Difficile Quick Screen (NO PCR Reflex)     Status: None   Collection Time: 01/06/21  9:23 PM   Specimen: Stool  Result Value Ref Range Status   C Diff antigen NEGATIVE NEGATIVE Final   C Diff toxin NEGATIVE NEGATIVE Final   C Diff interpretation No C. difficile detected.  Final  Comment: Performed at [facility], [address], [place], [place] [address]          Radiology Studies: CT ABDOMEN PELVIS W CONTRAST  Result Date: 01/06/2021 CLINICAL DATA:  55 y.o. female with medical history significant for CAD s/p PCI, hypertension, asthma/COPD, OSA not on CPAP, history of esophageal dysphagia with Schatzki's ring, s/p sigmoid colon resection for bowel perforation who presents with persistent nausea, vomiting and abdominal pain. Found to have hypokalemia and AKI and recurrent acute diverticulitis on CT abdomen and pelvis. EXAM: CT ABDOMEN AND PELVIS WITH CONTRAST TECHNIQUE: Multidetector CT imaging of the abdomen and pelvis was performed using the standard protocol following bolus administration of intravenous contrast. CONTRAST:  12mL OMNIPAQUE IOHEXOL 300 MG/ML  SOLN COMPARISON:  12/31/2020 FINDINGS: Lower chest: Minor linear subsegmental atelectasis, lower lobes. Lung bases otherwise clear. Hepatobiliary: No focal liver abnormality is seen. No gallstones, gallbladder wall thickening, or biliary dilatation. Pancreas: Unremarkable. No pancreatic ductal dilatation or surrounding inflammatory changes. Spleen: Normal in size without focal abnormality. Adrenals/Urinary Tract: Adrenal glands are unremarkable. Kidneys are normal, without renal calculi, focal lesion, or hydronephrosis. Bladder is unremarkable. Stomach/Bowel: There is colonic wall thickening with mild adjacent inflammatory haziness extending from the sigmoid flexure through the descending colon. There is mild wall thickening just  above the level of the sigmoid colon anastomosis. Sigmoid colon below this is decompressed. There is a small outpouching from the sigmoid colon just distal to its anastomosis, demonstrating vascular clips and a calcification, similar to the prior CT. There is no other area of colonic wall thickening or inflammation. Right colon is mildly distended with stool. Normal stomach and small bowel. Vascular/Lymphatic: Aortic atherosclerosis. No enlarged lymph nodes. Reproductive: Status post hysterectomy. No adnexal masses. Other: No abdominal wall hernia.  No ascites. Musculoskeletal: No acute or significant osseous findings. IMPRESSION: 1. Wall thickening of the left colon, from the splenic flexure through the descending colon, consistent with diffuse infectious or inflammatory colitis. More focal inflammation noted the proximal sigmoid colon on the prior study is without substantial change. There are no fluid collections to suggest an abscess. There is no extraluminal or free air. 2. No other acute abnormality within the abdomen or pelvis. Electronically Signed   By: Lajean Manes M.D.   On: 01/06/2021 14:04        Scheduled Meds:  amLODipine  10 mg Oral Daily   aspirin  81 mg Oral Daily   atorvastatin  80 mg Oral q1800   clopidogrel  75 mg Oral Daily   divalproex  1,000 mg Oral Daily   enoxaparin (LOVENOX) injection  40 mg Subcutaneous Q24H   ezetimibe  10 mg Oral Daily   furosemide  40 mg Intravenous Once   hydrocortisone  25 mg Rectal BID   isosorbide mononitrate  60 mg Oral Daily   mometasone-formoterol  2 puff Inhalation BID   pantoprazole (PROTONIX) IV  40 mg Intravenous Q12H   polyethylene glycol  17 g Oral BID   senna-docusate  1 tablet Oral BID   simethicone  160 mg Oral QID   tiotropium  18 mcg Inhalation Daily   Continuous Infusions:  sodium chloride Stopped (01/07/21 1712)   piperacillin-tazobactam (ZOSYN)  IV 3.375 g (01/08/21 0532)   promethazine (PHENERGAN) injection (IM or IVPB)  Stopped (01/06/21 0425)     LOS: 7 days    Time spent: 40 minutes    Irine Seal, MD [facility]   To contact the attending provider between 7A-7P or the covering  provider during after hours 7P-7A, please log into the web site [email or web address] and access using universal [place] password for that web site. If you do not have the password, please call the hospital operator.  01/08/2021, 10:45 AM

## 2021-01-09 ENCOUNTER — Telehealth: Payer: Self-pay

## 2021-01-09 LAB — BASIC METABOLIC PANEL
Anion gap: 4 — ABNORMAL LOW (ref 5–15)
BUN: 7 mg/dL (ref 6–20)
CO2: 29 mmol/L (ref 22–32)
Calcium: 8.5 mg/dL — ABNORMAL LOW (ref 8.9–10.3)
Chloride: 104 mmol/L (ref 98–111)
Creatinine, Ser: 0.8 mg/dL (ref 0.44–1.00)
GFR, Estimated: >60 mL/min (ref >60)
Glucose, Bld: 124 mg/dL — ABNORMAL HIGH (ref 70–99)
Potassium: 4.4 mmol/L (ref 3.5–5.1)
Sodium: 137 mmol/L (ref 135–145)

## 2021-01-09 LAB — CBC WITH DIFFERENTIAL/PLATELET
Abs Immature Granulocytes: 0.25 10*3/uL — ABNORMAL HIGH (ref 0.00–0.07)
Basophils Absolute: 0.1 10*3/uL (ref 0.0–0.1)
Basophils Relative: 1 %
Eosinophils Absolute: 0.2 10*3/uL (ref 0.0–0.5)
Eosinophils Relative: 2 %
HCT: 30.4 % — ABNORMAL LOW (ref 36.0–46.0)
Hemoglobin: 10.1 g/dL — ABNORMAL LOW (ref 12.0–15.0)
Immature Granulocytes: 2 %
Lymphocytes Relative: 26 %
Lymphs Abs: 3.7 10*3/uL (ref 0.7–4.0)
MCH: 30.7 pg (ref 26.0–34.0)
MCHC: 33.2 g/dL (ref 30.0–36.0)
MCV: 92.4 fL (ref 80.0–100.0)
Monocytes Absolute: 1.5 10*3/uL — ABNORMAL HIGH (ref 0.1–1.0)
Monocytes Relative: 11 %
Neutro Abs: 8.2 10*3/uL — ABNORMAL HIGH (ref 1.7–7.7)
Neutrophils Relative %: 58 %
Platelets: 585 10*3/uL — ABNORMAL HIGH (ref 150–400)
RBC: 3.29 MIL/uL — ABNORMAL LOW (ref 3.87–5.11)
RDW: 14 % (ref 11.5–15.5)
WBC: 14 10*3/uL — ABNORMAL HIGH (ref 4.0–10.5)
nRBC: 0 % (ref 0.0–0.2)

## 2021-01-09 MED ORDER — CIPROFLOXACIN HCL 500 MG PO TABS: 500.0000 mg | ORAL_TABLET | Freq: Two times a day (BID) | ORAL | 0 refills | Status: AC

## 2021-01-09 MED ORDER — METRONIDAZOLE 500 MG PO TABS: 500.0000 mg | ORAL_TABLET | Freq: Three times a day (TID) | ORAL | 0 refills | Status: AC

## 2021-01-09 MED ORDER — AMOXICILLIN-POT CLAVULANATE 875-125 MG PO TABS: 1.0000 | ORAL_TABLET | Freq: Two times a day (BID) | ORAL | 0 refills | Status: DC

## 2021-01-09 MED ORDER — FLUCONAZOLE 150 MG PO TABS: 150.0000 mg | ORAL_TABLET | Freq: Once | ORAL | 0 refills | Status: AC

## 2021-01-09 NOTE — Plan of Care (Signed)
Nutrition Education Note  RD consulted for nutrition education regarding nutrition management for diverticulosis/ Diverticulitis and gastroparesis.    RD provided "Gastroparesis" handout as well as "5 Sample Menus for Gradually Increasing Fiber" handout from the Academy of Nutrition and Dietetics. Reviewed patient's dietary recall and discussed ways for pt to meet nutrition goals over the next several weeks. Explained reasons for pt to follow a low fiber diet over the next few weeks. Reviewed low fiber foods and high fiber foods. Discussed best practice for long term management of diverticulosis is a high fiber diet and discussed ways to gradually increase fiber in the diet.   Discussed the importance of trial and error in the gastroparesis diet as different foods are triggers for different people. Explained some common triggering foods and other "safe" foods.  Teach back method used. Pt verbalizes understanding of information provided.   Expect good compliance.  Body mass index is Body mass index is 33.98 kg/m.Marland Kitchen Pt meets criteria for Obesity Class 1 based on current BMI.  Current diet order is Soft, patient is consuming approximately 100% of meals at this time. Labs and medications reviewed.   No further nutrition interventions warranted at this time. RD contact information provided. If additional nutrition issues arise, please re-consult RD.  Derrel Nip, RD, LDN (she/her/hers) Registered Dietitian I After-Hours/Weekend Pager # in [phone]

## 2021-01-09 NOTE — Progress Notes (Addendum)
[facility] SURGICAL PROGRESS NOTE (cpt [phone])  Hospital Day(s): 8.   Interval History: Patient seen and examined, no acute events or new complaints overnight. Patient reports she is feeling better this morning and very anxious to go home today. She continues to have very mild LLQ soreness, but no bloating, nausea, emesis this morning. Her leukocytosis continues to improve, down to 14.0K. BMP remains reassuring, no electrolyte derangements. She continues on soft diet; tolerating better this morning. She continues to have bowel function.   Review of Systems:  Constitutional: denies fever, chills  HEENT: denies cough or congestion  Respiratory: denies any shortness of breath  Cardiovascular: denies chest pain or palpitations  Gastrointestinal: + abdominal pain, denied N/V, or diarrhea/and bowel function as per interval history Genitourinary: denies burning with urination or urinary frequency   Vital signs in last 24 hours: [min-max] current  Temp:  [97.7 F (36.5 C)-98.4 F (36.9 C)] 97.7 F (36.5 C) (06/29 0255) Pulse Rate:  [90-103] 90 (06/29 0255) Resp:  [16-18] 18 (06/29 0255) BP: (137-149)/(80-90) 137/83 (06/29 0255) SpO2:  [95 %-99 %] 99 % (06/29 0255)     Height: 5\' 3"  (160 cm) Weight: 87 kg BMI (Calculated): 33.98   Intake/Output last 2 shifts:  06/28 0701 - 06/29 0700 In: 1900 [P.O.:1800; IV Piggyback:100] Out: 2 [Stool:2]   Physical Exam:  Constitutional: alert, cooperative and no distress HENT: normocephalic without obvious abnormality Eyes: PERRL, EOM's grossly intact and symmetric Respiratory: breathing non-labored at rest Cardiovascular: regular rate and sinus rhythm Gastrointestinal: Soft, no appreciable tenderness this morning, distended, no rebound/guarding Integumentary: She does have significant ecchymosis throughout her lower abdomen Musculoskeletal: no edema or wounds, motor and sensation grossly intact, NT   Labs:  CBC Latest Ref Rng &  Units 01/09/2021 01/08/2021 01/07/2021  WBC 4.0 - 10.5 K/uL 14.0(H) 15.0(H) 19.5(H)  Hemoglobin 12.0 - 15.0 g/dL 10.1(L) 10.5(L) 10.2(L)  Hematocrit 36.0 - 46.0 % 30.4(L) 31.3(L) 29.8(L)  Platelets 150 - 400 K/uL 585(H) 567(H) 605(H)   CMP Latest Ref Rng & Units 01/09/2021 01/08/2021 01/07/2021  Glucose 70 - 99 mg/dL 124(H) 105(H) 109(H)  BUN 6 - 20 mg/dL 7 6 6   Creatinine 0.44 - 1.00 mg/dL 0.80 0.81 0.77  Sodium 135 - 145 mmol/L 137 138 138  Potassium 3.5 - 5.1 mmol/L 4.4 4.1 4.2  Chloride 98 - 111 mmol/L 104 103 104  CO2 22 - 32 mmol/L 29 30 28   Calcium 8.9 - 10.3 mg/dL 8.5(L) 8.4(L) 8.4(L)  Total Protein 6.5 - 8.1 g/dL - 5.8(L) -  Total Bilirubin 0.3 - 1.2 mg/dL - 0.4 -  Alkaline Phos 38 - 126 U/L - 49 -  AST 15 - 41 U/L - 14(L) -  ALT 0 - 44 U/L - 24 -     Imaging studies: No new pertinent imaging studies   Assessment/Plan: (ICD-10's:  K74.92) 55 y.o. female with improving leukocytosis x48 hours and and LLQ abdominal pain concerning for acute uncomplicated diverticulitis vs colitis with concomitant gastroparesis complaining of significant nausea nd distension this morning, which seems to be most bothersome to her.    - Appreciate GI Assistance  - No emergent surgical intervention warranted  - Continue IV Abx (Zosyn); transition to Augmentin PO BID x7 days  - Okay to continue soft diet as tolerated - Monitor abdominal examination; on-going bowel function - Pain control prn (minimize narcotics); antiemetics prn - Monitor leukocytosis; improving - Further management per primary service  - Discharge Planning: Okay for discharge from surgical perspective,  Abx as above, she can follow up with PCP for this. She does NOT need surgical follow up.   All of the above findings and recommendations were discussed with the patient, and the medical team, and all of patient's questions were answered to \\her  expressed satisfaction.   -- Edison Simon, PA-C [facility] 01/09/2021, 7:14 AM [phone] M-F: 7am - 4pm  Patient discharged prior to my evaluation. I concur with Mr. Gery Pray documentation.

## 2021-01-09 NOTE — Telephone Encounter (Signed)
Transition Care Management Unsuccessful Follow-up Telephone Call  Date of discharge and from where:  01/09/21 from [facility]  Attempts:  1st Attempt  Reason for unsuccessful TCM follow-up call:  Left voice message. Will follow.

## 2021-01-09 NOTE — Progress Notes (Signed)
As recommended by general surgery, I discharged the patient already on Augmentin for 7 days. C however states that she is allergic to Augmentin and would not agree to take it.  She is requesting to discharge on ciprofloxacin and Flagyl which had worked for her before. I sent the prescription for ciprofloxacin and Flagyl for 7 more days.  As requested by patient, I also gave her a dose of Diflucan as he anticipates that yeast infection while on antibiotics.

## 2021-01-09 NOTE — Discharge Summary (Signed)
Physician Discharge Summary  Shawna Anderson SWF:[id number] DOB: [birth year] DOA: 12/31/2020  PCP: McLean-Scocuzza, Nino Glow, MD  Admit date: 12/31/2020 Discharge date: 01/09/2021  Admitted From: Home Discharge disposition: Home   Code Status: Full Code  Diet Recommendation: Cardiac diet  Discharge Diagnosis:   Principal Problem:   Acute diverticulitis Active Problems:   CAD S/P CFX PCI 2010   Bipolar disorder (HCC)   OSA (obstructive sleep apnea)   Gastroesophageal reflux disease   AKI (acute kidney injury) ([place])   Hypokalemia   Transaminitis   Diverticulitis   Gastroparesis  History of Present Illness / Brief narrative:  Shawna Anderson is an 55 y.o. female with medical history significant for CAD s/p PCI, hypertension, asthma/COPD, OSA not on CPAP, history of esophageal dysphagia with Schatzki's ring, s/p sigmoid colon resection for bowel perforation . Patient presented to ED on 12/31/2020 with persistent nausea, vomiting and abdominal pain.   She was found to have recurrent acute diverticulitis in CT abdomen pelvis.   Admitted to hospitalist service.  Started on IV antibiotics.   General surgery and GI consultations were obtained.     Subjective:  Seen and examined this morning.  Pleasant middle-aged Hispanic female.  Sitting up in bed.  Not in distress Wants to go home.  Hospital Course:  Recurrent acute diverticulitis/colitis -Recently admitted from 6/6 to 12/19/2020 for acute diverticulitis.  Discharged on ciprofloxacin and Flagyl despite which symptoms continued and recurred. -Readmitted for worsening symptoms -Treated with IV antibiotics. -GI and general surgery consultation were obtained. -Sick/26, repeat CT scan of abdomen showed wall thickening of the left colon from the splenic flexure through the descending colon consistent with diffuse infectious or inflammatory colitis.  More focal inflammation noted at the proximal sigmoid colon on prior study without  substantial change.  No abscess noted.  No extraluminal free air. -GI pathogen panel as well as C. difficile negative. -Per consultant recommendation, will plan to discharge her home on oral Augmentin today for 7 more days.  Acute kidney injury -Creatinine improved with IV fluid. Recent Labs    12/31/20 1515 01/01/21 0422 01/02/21 0500 01/03/21 0510 01/04/21 2202 01/05/21 0825 01/06/21 0612 01/07/21 0659 01/08/21 0511 01/09/21 0526  BUN 17 18 11  <5* <5* 7 10 6 6 7   CREATININE 1.90* 1.42* 0.82 0.79 0.79 0.75 0.79 0.77 0.81 0.80   Mild transaminitis -Resolved with hydration . -IV fluids discontinued.    History of coronary artery disease -Continue Plavix, aspirin, Lipitor, Zetia, Imdur.    Hypertension -Controlled on current regimen of Imdur, Norvasc.   GERD -PPI   Bipolar disorder -Stable.   -Continue Depakote, trazodone.   -Outpatient follow-up.  Obesity  OSA -Not on CPAP prior to admission.   -Outpatient follow-up.    Constipation -Continue current bowel regimen of Senokot-S twice daily, MiraLAX twice daily.  Minimal- mild gastroparesis -As needed Reglan, PPI  Wound care:    Discharge Exam:   Vitals:   01/08/21 1517 01/08/21 1927 01/09/21 0255 01/09/21 0750  BP: 139/80 (!) 149/82 137/83 116/90  Pulse: 99 91 90 74  Resp: 18 16 18 18   Temp: 97.8 F (36.6 C) 98.3 F (36.8 C) 97.7 F (36.5 C) 98 F (36.7 C)  TempSrc:   Oral Oral  SpO2: 95% 99% 99% 98%  Weight:      Height:        Body mass index is 33.98 kg/m.  General exam: Pleasant, Skin: No rashes, lesions or ulcers. HEENT: Atraumatic, normocephalic, no obvious bleeding  Lungs: Clear to auscultation bilaterally CVS: Regular rate and rhythm, no murmur GI/Abd soft, nontender, nondistended, bowel sound present CNS: Alert, awake, oriented x3 Psychiatry: Mood appropriate Extremities: No pedal edema, no calf tenderness  Follow ups:   Discharge Instructions     Diet - low sodium heart  healthy   Complete by: As directed    Increase activity slowly   Complete by: As directed        Follow-up Information     McLean-Scocuzza, Nino Glow, MD Follow up.   Specialty: Internal Medicine Why: pt will have to make Contact information: [address] [place] [address] [phone]         Nelva Bush, MD .   Specialty: Cardiology Why: pt will make appointment Contact information: [place] [address] [phone]                 Recommendations for Outpatient Follow-Up:   Follow-up with PCP as an outpatient  Discharge Instructions:  Follow with Primary MD McLean-Scocuzza, Nino Glow, MD in 7 days   Get CBC/BMP checked in next visit within 1 week by PCP or SNF MD ( we routinely change or add medications that can affect your baseline labs and fluid status, therefore we recommend that you get the mentioned basic workup next visit with your PCP, your PCP may decide not to get them or add new tests based on their clinical decision)  On your next visit with your PCP, please Get Medicines reviewed and adjusted.  Please request your PCP  to go over all Hospital Tests and Procedure/Radiological results at the follow up, please get all Hospital records sent to your Prim MD by signing hospital release before you go home.  Activity: As tolerated with Full fall precautions use walker/cane & assistance as needed  For Heart failure patients - Check your Weight same time everyday, if you gain over 2 pounds, or you develop in leg swelling, experience more shortness of breath or chest pain, call your Primary MD immediately. Follow Cardiac Low Salt Diet and 1.5 lit/day fluid restriction.  If you have smoked or chewed Tobacco in the last 2 yrs please stop smoking, stop any regular Alcohol  and or any Recreational drug use.  If you experience worsening of your admission symptoms, develop shortness of breath, life threatening emergency,  suicidal or homicidal thoughts you must seek medical attention immediately by calling 911 or calling your MD immediately  if symptoms less severe.  You Must read complete instructions/literature along with all the possible adverse reactions/side effects for all the Medicines you take and that have been prescribed to you. Take any new Medicines after you have completely understood and accpet all the possible adverse reactions/side effects.   Do not drive, operate heavy machinery, perform activities at heights, swimming or participation in water activities or provide baby sitting services if your were admitted for syncope or siezures until you have seen by Primary MD or a Neurologist and advised to do so again.  Do not drive when taking Pain medications.  Do not take more than prescribed Pain, Sleep and Anxiety Medications  Wear Seat belts while driving.   Please note You were cared for by a hospitalist during your hospital stay. If you have any questions about your discharge medications or the care you received while you were in the hospital after you are discharged, you can call the unit and asked to speak with the hospitalist on call if the  hospitalist that took care of you is not available. Once you are discharged, your primary care physician will handle any further medical issues. Please note that NO REFILLS for any discharge medications will be authorized once you are discharged, as it is imperative that you return to your primary care physician (or establish a relationship with a primary care physician if you do not have one) for your aftercare needs so that they can reassess your need for medications and monitor your lab values.    Time coordinating discharge: 35 minutes Allergies as of 01/09/2021       Reactions   Ativan [lorazepam]    Pt states it makes her tongue do weird things    Augmentin [amoxicillin-pot Clavulanate]    Upset stomach   Latex Other (See Comments)   Patient stated  that she was told by her doctor that she is "allergic to" this   Tape Other (See Comments)   Patient stated that she was told by her doctor that she is "allergic to" this   Xifaxan [rifaximin]    Pt believes this is contributing to her abdominal pain/discomfort   Drixoral Allergy Sinus [dexbromphen-pse-apap Er] Rash        Medication List     TAKE these medications    albuterol 108 (90 Base) MCG/ACT inhaler Commonly known as: VENTOLIN HFA Inhale 1-2 puffs into the lungs every 6 (six) hours as needed for wheezing or shortness of breath.   amLODipine 10 MG tablet Commonly known as: NORVASC Take 1 tablet (10 mg total) by mouth at bedtime.   amoxicillin-clavulanate 875-125 MG tablet Commonly known as: Augmentin Take 1 tablet by mouth 2 (two) times daily for 7 days.   aspirin 81 MG chewable tablet Chew 81 mg by mouth daily.   atorvastatin 80 MG tablet Commonly known as: LIPITOR Take 1 tablet (80 mg total) by mouth daily at 6 PM.   Breztri Aerosphere 160-9-4.8 MCG/ACT Aero Generic drug: Budeson-Glycopyrrol-Formoterol Inhale 2 puffs into the lungs in the morning and at bedtime. Rinse   clopidogrel 75 MG tablet Commonly known as: PLAVIX Take 1 tablet (75 mg total) by mouth daily.   divalproex 500 MG DR tablet Commonly known as: DEPAKOTE TAKE 1 TABLET BY MOUTH  TWICE DAILY What changed:  how much to take when to take this   ezetimibe 10 MG tablet Commonly known as: ZETIA TAKE 1 TABLET BY MOUTH  DAILY WITH LIPITOR 80 MG AT NIGHT   icosapent Ethyl 1 g capsule Commonly known as: Vascepa Take 2 capsules (2 g total) by mouth 2 (two) times daily.   isosorbide mononitrate 60 MG 24 hr tablet Commonly known as: IMDUR Take 1 tablet (60 mg total) by mouth daily.   methocarbamol 500 MG tablet Commonly known as: Robaxin Take 1 tablet (500 mg total) by mouth 2 (two) times daily as needed for muscle spasms.   nitroGLYCERIN 0.4 MG SL tablet Commonly known as:  NITROSTAT DISSOLVE 1 TABLET UNDER  TONGUE EVERY 5 MINUTES AS  NEEDED FOR CHEST PAIN MAX  OF 3 TABLETS IN 15 MINUTES  . CALL 911 AFTER THIRD DOSE   pantoprazole 40 MG tablet Commonly known as: PROTONIX TAKE 1 TABLET BY MOUTH  TWICE DAILY BEFORE MEALS   polyethylene glycol 17 g packet Commonly known as: MIRALAX / GLYCOLAX Take 17 g by mouth daily.   Repatha SureClick 621 MG/ML Soaj Generic drug: Evolocumab Inject 1 mL into the skin every 14 (fourteen) days.   traZODone 150 MG tablet  Commonly known as: DESYREL Take 1 tablet (150 mg total) by mouth at bedtime as needed for sleep.   Vitamin D (Cholecalciferol) 25 MCG (1000 UT) Caps Take 1 capsule by mouth daily.        The results of significant diagnostics from this hospitalization (including imaging, microbiology, ancillary and laboratory) are listed below for reference.    Procedures and Diagnostic Studies:   CT Abdomen Pelvis Wo Contrast  Result Date: 12/31/2020 CLINICAL DATA:  Abdominal pain, emesis, diverticulitis suspected EXAM: CT ABDOMEN AND PELVIS WITHOUT CONTRAST TECHNIQUE: Multidetector CT imaging of the abdomen and pelvis was performed following the standard protocol without IV contrast. COMPARISON:  12/17/2020 FINDINGS: Lower chest: No acute abnormality. Hepatobiliary: No solid liver abnormality is seen. No gallstones, gallbladder wall thickening, or biliary dilatation. Pancreas: Unremarkable. No pancreatic ductal dilatation or surrounding inflammatory changes. Spleen: Normal in size without significant abnormality. Adrenals/Urinary Tract: Adrenal glands are unremarkable. Kidneys are normal, without renal calculi, solid lesion, or hydronephrosis. Bladder is unremarkable. Stomach/Bowel: Stomach is within normal limits. Appendix appears normal. Status post sigmoid colon resection. There is there is redemonstrated relatively focal wall thickening and fat stranding about diverticula of the remnant proximal sigmoid, similar to  prior examination (series 2, image 60). Vascular/Lymphatic: Aortic atherosclerosis. No enlarged abdominal or pelvic lymph nodes. Reproductive: Status post hysterectomy. Other: No abdominal wall hernia or abnormality. No abdominopelvic ascites. Musculoskeletal: No acute or significant osseous findings. IMPRESSION: Status post sigmoid colon resection. There is redemonstrated relatively focal wall thickening and fat stranding about diverticula of the remnant proximal sigmoid, similar to prior examination. Findings are consistent with acute diverticulitis. No evidence of perforation or abscess. Aortic Atherosclerosis (ICD10-I70.0). Electronically Signed   By: Eddie Candle M.D.   On: 12/31/2020 17:54     Labs:   Basic Metabolic Panel: Recent Labs  Lab 01/03/21 0510 01/04/21 8099 01/05/21 0825 01/06/21 0612 01/07/21 0659 01/08/21 0511 01/09/21 0526  NA 140   < > 140 138 138 138 137  K 3.7   < > 4.2 3.9 4.2 4.1 4.4  CL 103   < > 105 105 104 103 104  CO2 29   < > 27 26 28 30 29   GLUCOSE 112*   < > 124* 132* 109* 105* 124*  BUN <5*   < > 7 10 6 6 7   CREATININE 0.79   < > 0.75 0.79 0.77 0.81 0.80  CALCIUM 8.6*   < > 8.9 9.2 8.4* 8.4* 8.5*  MG 1.9  --  2.0 1.7 2.1 2.2  --    < > = values in this interval not displayed.   GFR Estimated Creatinine Clearance: 85 mL/min (by C-G formula based on SCr of 0.8 mg/dL). Liver Function Tests: Recent Labs  Lab 01/03/21 0510 01/08/21 0511  AST 19 14*  ALT 43 24  ALKPHOS 61 49  BILITOT 0.4 0.4  PROT 5.7* 5.8*  ALBUMIN 3.1* 3.0*   No results for input(s): LIPASE, AMYLASE in the last 168 hours. No results for input(s): AMMONIA in the last 168 hours. Coagulation profile No results for input(s): INR, PROTIME in the last 168 hours.  CBC: Recent Labs  Lab 01/06/21 0612 01/07/21 0659 01/07/21 1405 01/08/21 0511 01/09/21 0526  WBC 24.1* 20.2* 19.5* 15.0* 14.0*  NEUTROABS 18.7* 13.6* 13.3* 8.9* 8.2*  HGB 11.6* 10.7* 10.2* 10.5* 10.1*  HCT 33.4*  31.8* 29.8* 31.3* 30.4*  MCV 90.8 91.6 91.4 90.7 92.4  PLT 620* 621* 605* 567* 585*   Cardiac Enzymes: No  results for input(s): CKTOTAL, CKMB, CKMBINDEX, TROPONINI in the last 168 hours. BNP: Invalid input(s): POCBNP CBG: No results for input(s): GLUCAP in the last 168 hours. D-Dimer No results for input(s): DDIMER in the last 72 hours. Hgb A1c No results for input(s): HGBA1C in the last 72 hours. Lipid Profile No results for input(s): CHOL, HDL, LDLCALC, TRIG, CHOLHDL, LDLDIRECT in the last 72 hours. Thyroid function studies No results for input(s): TSH, T4TOTAL, T3FREE, THYROIDAB in the last 72 hours.  Invalid input(s): FREET3 Anemia work up No results for input(s): VITAMINB12, FOLATE, FERRITIN, TIBC, IRON, RETICCTPCT in the last 72 hours. Microbiology Recent Results (from the past 240 hour(s))  CULTURE, BLOOD (ROUTINE X 2) w Reflex to ID Panel     Status: None   Collection Time: 12/31/20  7:19 PM   Specimen: BLOOD  Result Value Ref Range Status   Specimen Description BLOOD RIGHT ANTECUBITAL  Final   Special Requests   Final    BOTTLES DRAWN AEROBIC AND ANAEROBIC Blood Culture adequate volume   Culture   Final    NO GROWTH 5 DAYS Performed at [facility], [address] [place], [place] [address]    Report Status 01/05/2021 FINAL  Final  CULTURE, BLOOD (ROUTINE X 2) w Reflex to ID Panel     Status: None   Collection Time: 12/31/20  7:25 PM   Specimen: BLOOD  Result Value Ref Range Status   Specimen Description BLOOD BLOOD RIGHT HAND  Final   Special Requests   Final    BOTTLES DRAWN AEROBIC AND ANAEROBIC Blood Culture adequate volume   Culture   Final    NO GROWTH 5 DAYS Performed at [facility], [address] [place], [place] [address]    Report Status 01/05/2021 FINAL  Final  Resp Panel by RT-PCR (Flu A&B, Covid) Nasopharyngeal Swab     Status: None   Collection Time: 12/31/20  9:13 PM   Specimen: Nasopharyngeal Swab; Nasopharyngeal(NP)  swabs in vial transport medium  Result Value Ref Range Status   SARS Coronavirus 2 by RT PCR NEGATIVE NEGATIVE Final    Comment: (NOTE) SARS-CoV-2 target nucleic acids are NOT DETECTED.  The SARS-CoV-2 RNA is generally detectable in upper respiratory specimens during the acute phase of infection. The lowest concentration of SARS-CoV-2 viral copies this assay can detect is 138 copies/mL. A negative result does not preclude SARS-Cov-2 infection and should not be used as the sole basis for treatment or other patient management decisions. A negative result may occur with  improper specimen collection/handling, submission of specimen other than nasopharyngeal swab, presence of viral mutation(s) within the areas targeted by this assay, and inadequate number of viral copies(<138 copies/mL). A negative result must be combined with clinical observations, patient history, and epidemiological information. The expected result is Negative.  Fact Sheet for Patients:  EntrepreneurPulse.com.au  Fact Sheet for Healthcare Providers:  IncredibleEmployment.be  This test is no t yet approved or cleared by the [place] FDA and  has been authorized for detection and/or diagnosis of SARS-CoV-2 by FDA under an Emergency Use Authorization (EUA). This EUA will remain  in effect (meaning this test can be used) for the duration of the COVID-19 declaration under Section 564(b)(1) of the Act, 21 U.S.C.section 360bbb-3(b)(1), unless the authorization is terminated  or revoked sooner.       Influenza A by PCR NEGATIVE NEGATIVE Final   Influenza B by PCR NEGATIVE NEGATIVE Final    Comment: (NOTE) The Xpert Xpress SARS-CoV-2/FLU/RSV  plus assay is intended as an aid in the diagnosis of influenza from Nasopharyngeal swab specimens and should not be used as a sole basis for treatment. Nasal washings and aspirates are unacceptable for Xpert Xpress  SARS-CoV-2/FLU/RSV testing.  Fact Sheet for Patients: EntrepreneurPulse.com.au  Fact Sheet for Healthcare Providers: IncredibleEmployment.be  This test is not yet approved or cleared by the [place] FDA and has been authorized for detection and/or diagnosis of SARS-CoV-2 by FDA under an Emergency Use Authorization (EUA). This EUA will remain in effect (meaning this test can be used) for the duration of the COVID-19 declaration under Section 564(b)(1) of the Act, 21 U.S.C. section 360bbb-3(b)(1), unless the authorization is terminated or revoked.  Performed at [facility], [address] [place], [place] [address]   Gastrointestinal Panel by PCR , Stool     Status: None   Collection Time: 01/06/21  9:23 PM   Specimen: Stool  Result Value Ref Range Status   Campylobacter species NOT DETECTED NOT DETECTED Final   Plesimonas shigelloides NOT DETECTED NOT DETECTED Final   Salmonella species NOT DETECTED NOT DETECTED Final   Yersinia enterocolitica NOT DETECTED NOT DETECTED Final   Vibrio species NOT DETECTED NOT DETECTED Final   Vibrio cholerae NOT DETECTED NOT DETECTED Final   Enteroaggregative E coli (EAEC) NOT DETECTED NOT DETECTED Final   Enteropathogenic E coli (EPEC) NOT DETECTED NOT DETECTED Final   Enterotoxigenic E coli (ETEC) NOT DETECTED NOT DETECTED Final   Shiga like toxin producing E coli (STEC) NOT DETECTED NOT DETECTED Final   Shigella/Enteroinvasive E coli (EIEC) NOT DETECTED NOT DETECTED Final   Cryptosporidium NOT DETECTED NOT DETECTED Final   Cyclospora cayetanensis NOT DETECTED NOT DETECTED Final   Entamoeba histolytica NOT DETECTED NOT DETECTED Final   Giardia lamblia NOT DETECTED NOT DETECTED Final   Adenovirus F40/41 NOT DETECTED NOT DETECTED Final   Astrovirus NOT DETECTED NOT DETECTED Final   Norovirus GI/GII NOT DETECTED NOT DETECTED Final   Rotavirus A NOT DETECTED NOT DETECTED Final   Sapovirus (I,  II, IV, and V) NOT DETECTED NOT DETECTED Final    Comment: Performed at [facility], [address] [place], [place] [address]  C Difficile Quick Screen (NO PCR Reflex)     Status: None   Collection Time: 01/06/21  9:23 PM   Specimen: Stool  Result Value Ref Range Status   C Diff antigen NEGATIVE NEGATIVE Final   C Diff toxin NEGATIVE NEGATIVE Final   C Diff interpretation No C. difficile detected.  Final    Comment: Performed at [facility], [address] [place], [place] [address]     Signed: Terrilee Croak  [facility] 01/09/2021, 11:55 AM

## 2021-01-09 NOTE — Progress Notes (Signed)
Mobility Specialist - Progress Note   01/09/21 1100  Mobility  Activity Ambulated in hall  Level of Assistance Independent  Distance Ambulated (ft) 100 ft  Mobility performed by Mobility specialist  $Mobility charge 1 Mobility    Pt ambulating in hallway independently. No LOB. No complaints. Pt anticipating d/c later this date.    Kathee Delton Mobility Specialist 01/09/21, 11:35 AM

## 2021-01-10 NOTE — Telephone Encounter (Signed)
Transition Care Management Unsuccessful Follow-up Telephone Call  Date of discharge and from where:  01/09/21 from [facility]  Attempts:  2nd Attempt  Reason for unsuccessful TCM follow-up call:  Left voice message

## 2021-01-11 NOTE — Telephone Encounter (Signed)
error 

## 2021-01-11 NOTE — Telephone Encounter (Signed)
Transition Care Management Unsuccessful Follow-up Telephone Call  Date of discharge and from where:  01/09/21 from [facility]  Attempts:  3rd Attempt  Reason for unsuccessful TCM follow-up call:  Unable to reach patient. HFU scheduled.

## 2021-01-14 ENCOUNTER — Telehealth: Payer: Self-pay | Admitting: Internal Medicine

## 2021-01-14 NOTE — Telephone Encounter (Signed)
[facility] pain clinic declined to see patient can she see Dr. Morene Rankins?  Will you send referral there?  Also inform pt

## 2021-01-15 NOTE — Telephone Encounter (Signed)
Attempting to put in referral. Provider's name not in the system to attach to referral. What practice is this provider a part of?

## 2021-01-16 NOTE — Telephone Encounter (Signed)
Referral resent  

## 2021-01-16 NOTE — Addendum Note (Signed)
Addended by: Orland Mustard on: 01/16/2021 01:37 PM   Modules accepted: Orders

## 2021-01-17 ENCOUNTER — Other Ambulatory Visit: Payer: Self-pay

## 2021-01-17 ENCOUNTER — Ambulatory Visit (INDEPENDENT_AMBULATORY_CARE_PROVIDER_SITE_OTHER): Payer: Medicare Other | Admitting: Pulmonary Disease

## 2021-01-17 ENCOUNTER — Encounter: Payer: Self-pay | Admitting: Pulmonary Disease

## 2021-01-17 ENCOUNTER — Other Ambulatory Visit
Admission: RE | Admit: 2021-01-17 | Discharge: 2021-01-17 | Disposition: A | Discharge status: Home / Self Care | Payer: Medicare Other | Source: Ambulatory Visit | Attending: Pulmonary Disease | Admitting: Pulmonary Disease

## 2021-01-17 VITALS — BP 118/70 | HR 82 | Temp 97.1°F | Ht 63.0 in | Wt 191.4 lb

## 2021-01-17 DIAGNOSIS — J449 Chronic obstructive pulmonary disease, unspecified: Secondary | ICD-10-CM | POA: Diagnosis not present

## 2021-01-17 DIAGNOSIS — Z20822 Contact with and (suspected) exposure to covid-19: Secondary | ICD-10-CM | POA: Insufficient documentation

## 2021-01-17 DIAGNOSIS — Z87891 Personal history of nicotine dependence: Secondary | ICD-10-CM

## 2021-01-17 DIAGNOSIS — Z01812 Encounter for preprocedural laboratory examination: Secondary | ICD-10-CM | POA: Insufficient documentation

## 2021-01-17 DIAGNOSIS — R0602 Shortness of breath: Secondary | ICD-10-CM | POA: Diagnosis not present

## 2021-01-17 NOTE — Patient Instructions (Signed)
Congratulations on quitting smoking.  Continue Breztri 2 puffs twice a day.  Make sure you rinse your mouth well after you use it.  Continue albuterol as needed.  We have rescheduled your breathing tests with only the portion not requiring you to go into a booth.  We will see him in follow-up in 3 months time call sooner should any new problems arise.

## 2021-01-17 NOTE — Progress Notes (Signed)
Subjective:    Patient ID: Shawna Anderson, female    DOB: July 28, 1965, 55 y.o.   MRN: [id number] Chief Complaint  Patient presents with   Follow-up    HPI Patient is a 55 year old recent former smoker (quit 6/20) who presents for follow-up on recurrent bronchitis.  Patient was first evaluated on 02 October 2020, for the details of that visit please refer to that note.  Patient states that she had been referred for recurrent bronchitis however she states that this happens with change of weather at least twice a year.  By definition she has chronic bronchitis.  We had requested PFTs however, she was unable to complete them due to severe anxiety particularly with the plethysmograph part of the procedure.  She states today that her breathing is "fine" she has been using Breztri 2 puffs twice a day and finds this helpful.  She is also using as needed albuterol.  Her main issue seems to be related to severe anxiety.    States that as far as her breathing is concerned she is doing well.  She states that clearly changes in weather bring on her exacerbations.  She quit smoking in June which will likely help.  She has not had any fevers, chills or sweats.  Review of Systems A 10 point review of systems was performed and it is as noted above otherwise negative.  Patient Active Problem List   Diagnosis Date Noted   Gastroparesis    Diverticulitis 01/01/2021   AKI (acute kidney injury) ([place]) 12/31/2020   Hypokalemia 12/31/2020   Transaminitis 12/31/2020   Acute diverticulitis 12/17/2020   Chronic pain syndrome 12/05/2020   Acute bronchitis with COPD ([place]) 11/28/2020   Cough 11/28/2020   Accelerating angina ([place]) 09/11/2020   Chronic nausea 03/26/2020   SUI (stress urinary incontinence, female) 03/26/2020   Bronchitis 03/26/2020   Continuous LLQ abdominal pain 03/01/2020   URI with cough and congestion 03/01/2020   Esophageal dysphagia 01/26/2020   Schatzki's ring of distal esophagus  01/26/2020   Ecchymosis 01/11/2020   Chronic pain of both knees 01/11/2020   Bruising 12/05/2019   Trichomonas infection 12/05/2019   Elevated liver enzymes 11/15/2019   Prediabetes 11/15/2019   STD exposure 11/15/2019   Anemia 11/15/2019   Chronic bursitis of right shoulder 10/25/2019   Chest wall pain 09/05/2019   Poor dentition 08/18/2019   Thrombocytosis 08/18/2019   Leukocytosis 08/18/2019   Anal lesion 08/18/2019   Female pelvic pain 05/26/2019   Colitis 05/26/2019   HLD (hyperlipidemia) 12/27/2018   Vitamin D deficiency 12/24/2018   Bipolar disorder ([place]) 12/30/2017   OSA (obstructive sleep apnea) 12/30/2017   Constipation 12/30/2017   Coronary artery disease of native artery of native heart with stable angina pectoris ([place]) 12/30/2017   Gastroesophageal reflux disease 12/30/2017   Asthma 12/29/2017   COPD (chronic obstructive pulmonary disease) ([place]) 12/29/2017   Anxiety and depression 12/29/2017   Insomnia 12/29/2017   Chronic back pain 12/29/2017   Skin lesion of back 12/29/2017   CAD S/P CFX PCI 2010 04/15/2016   Essential hypertension 04/15/2016   Noncompliance with medication regimen 04/15/2016   Chest pain with moderate risk of acute coronary syndrome 04/12/2016   Abdominal pain 01/12/2015   Vomiting 01/12/2015   Nausea and vomiting 01/12/2015   Social History   Tobacco Use   Smoking status: Former    Packs/day: 1.00    Years: 30.00    Pack years: 30.00    Types: Cigarettes  Quit date: 12/31/2020    Years since quitting: 0.0   Smokeless tobacco: Never   Tobacco comments:    Quit after being admitted on 6/20 01/17/2021  Substance Use Topics   Alcohol use: Not Currently    Comment: once a year   Allergies  Allergen Reactions   Ativan [Lorazepam]     Pt states it makes her tongue do weird things    Augmentin [Amoxicillin-Pot Clavulanate]     Upset stomach    Latex Other (See Comments)    Patient stated that she was told by her doctor that she  is "allergic to" this   Tape Other (See Comments)    Patient stated that she was told by her doctor that she is "allergic to" this   Xifaxan [Rifaximin]     Pt believes this is contributing to her abdominal pain/discomfort   Drixoral Allergy Sinus [Dexbromphen-Pse-Apap Er] Rash   Current Meds  Medication Sig   albuterol (VENTOLIN HFA) 108 (90 Base) MCG/ACT inhaler Inhale 1-2 puffs into the lungs every 6 (six) hours as needed for wheezing or shortness of breath.   amLODipine (NORVASC) 10 MG tablet Take 1 tablet (10 mg total) by mouth at bedtime.   aspirin 81 MG chewable tablet Chew 81 mg by mouth daily.   Budeson-Glycopyrrol-Formoterol (BREZTRI AEROSPHERE) 160-9-4.8 MCG/ACT AERO Inhale 2 puffs into the lungs in the morning and at bedtime. Rinse   clopidogrel (PLAVIX) 75 MG tablet Take 1 tablet (75 mg total) by mouth daily.   divalproex (DEPAKOTE) 500 MG DR tablet TAKE 1 TABLET BY MOUTH  TWICE DAILY   Evolocumab (REPATHA SURECLICK) 166 MG/ML SOAJ Inject 1 mL into the skin every 14 (fourteen) days.   ezetimibe (ZETIA) 10 MG tablet TAKE 1 TABLET BY MOUTH  DAILY WITH LIPITOR 80 MG AT NIGHT   icosapent Ethyl (VASCEPA) 1 g capsule Take 2 capsules (2 g total) by mouth 2 (two) times daily.   isosorbide mononitrate (IMDUR) 60 MG 24 hr tablet Take 1 tablet (60 mg total) by mouth daily.   methocarbamol (ROBAXIN) 500 MG tablet Take 1 tablet (500 mg total) by mouth 2 (two) times daily as needed for muscle spasms.   nitroGLYCERIN (NITROSTAT) 0.4 MG SL tablet DISSOLVE 1 TABLET UNDER  TONGUE EVERY 5 MINUTES AS  NEEDED FOR CHEST PAIN MAX  OF 3 TABLETS IN 15 MINUTES  . CALL 911 AFTER THIRD DOSE   pantoprazole (PROTONIX) 40 MG tablet TAKE 1 TABLET BY MOUTH  TWICE DAILY BEFORE MEALS   polyethylene glycol (MIRALAX / GLYCOLAX) 17 g packet Take 17 g by mouth daily.   traZODone (DESYREL) 150 MG tablet Take 1 tablet (150 mg total) by mouth at bedtime as needed for sleep.   Vitamin D, Cholecalciferol, 25 MCG (1000 UT)  CAPS Take 1 capsule by mouth daily.   Immunization History  Administered Date(s) Administered   PFIZER(Purple Top)SARS-COV-2 Vaccination 11/04/2019, 11/18/2019, 04/23/2020       Objective:   Physical Exam BP 118/70 (BP Location: Left Arm, Patient Position: Sitting, Cuff Size: Normal)   Pulse 82   Temp (!) 97.1 F (36.2 C) (Oral)   Ht 5\' 3"  (1.6 m)   Wt 191 lb 6.4 oz (86.8 kg)   SpO2 98%   BMI 33.90 kg/m   GENERAL: Obese woman, no acute distress.  Fully ambulatory.  Anxious.  No conversational dyspnea. HEAD: Normocephalic, atraumatic. EYES: Pupils equal, round, reactive to light.  No scleral icterus. MOUTH: Nose/mouth/throat not examined due to masking requirements  for COVID 19. NECK: Supple. No thyromegaly. Trachea midline. No JVD.  No adenopathy. PULMONARY: Good air entry bilaterally.  Coarse otherwise, no adventitious sounds. CARDIOVASCULAR: S1 and S2. Regular rate and rhythm.  No rubs, murmurs or gallops heard. ABDOMEN: Obese. MUSCULOSKELETAL: No joint deformity, no clubbing, no edema. NEUROLOGIC: Neuro normal. SKIN: Intact,warm,dry. PSYCH: Pressured speech.  Hypervigilant.     Assessment & Plan:     ICD-10-CM   1. Chronic bronchitis with COPD (chronic obstructive pulmonary disease) (HCC)  J44.9 Pulmonary Function Test [facility] Only   By definition she has chronic bronchitis having exacerbations several times a year She has quit smoking which will likely help She was unable to complete PFTs    2. Shortness of breath  R06.02    Currently not an issue Notes this with exacerbations only    3. Former heavy cigarette smoker (20-39 per day)  Z87.891    Commended on her smoking cessation     Orders Placed This Encounter  Procedures   Pulmonary Function Test [facility] Only    Pre and post spirometry    Standing Status:   Future    Standing Expiration Date:   01/17/2022    Order Specific Question:   This test can only be performed at    Answer:   [facility]    Discussion:  By definition the patient fits the criteria for chronic bronchitis.  She was unable to complete PFTs due to severe anxiety particularly when performing the plethysmograph (body box) study to determine lung volumes.  Will proceed with performing spirometry pre and postbronchodilator in the hopes that she can complete this simpler portion of the test.  Otherwise we will have no way of quantitating the severity of her disease.  He was to be limited mostly by issues with chronic anxiety and may benefit from formal evaluation of this issue.  She is to continue Breztri 2 puffs twice a day.  We will see her in follow-up in 3 months time she is to contact us prior to that time should any new difficulties arise.  Renold Don, MD [facility]   *This note was dictated using voice recognition software/Dragon.  Despite best efforts to proofread, errors can occur which can change the meaning.  Any change was purely unintentional.

## 2021-01-18 ENCOUNTER — Ambulatory Visit: Payer: Medicare Other | Attending: Pulmonary Disease

## 2021-01-18 DIAGNOSIS — J449 Chronic obstructive pulmonary disease, unspecified: Secondary | ICD-10-CM | POA: Diagnosis not present

## 2021-01-18 DIAGNOSIS — Z87891 Personal history of nicotine dependence: Secondary | ICD-10-CM | POA: Diagnosis not present

## 2021-01-18 DIAGNOSIS — R0600 Dyspnea, unspecified: Secondary | ICD-10-CM | POA: Diagnosis not present

## 2021-01-18 LAB — SARS CORONAVIRUS 2 (TAT 6-24 HRS): SARS Coronavirus 2: NEGATIVE

## 2021-01-21 ENCOUNTER — Ambulatory Visit: Payer: Medicare Other | Admitting: Gastroenterology

## 2021-01-23 ENCOUNTER — Encounter: Payer: Self-pay | Admitting: Internal Medicine

## 2021-01-23 ENCOUNTER — Other Ambulatory Visit: Payer: Self-pay

## 2021-01-23 ENCOUNTER — Ambulatory Visit (INDEPENDENT_AMBULATORY_CARE_PROVIDER_SITE_OTHER): Payer: Medicare Other | Admitting: Internal Medicine

## 2021-01-23 ENCOUNTER — Telehealth: Payer: Self-pay | Admitting: Internal Medicine

## 2021-01-23 VITALS — BP 118/72 | HR 90 | Temp 97.3°F | Ht 63.0 in | Wt 191.4 lb

## 2021-01-23 DIAGNOSIS — R109 Unspecified abdominal pain: Secondary | ICD-10-CM | POA: Diagnosis not present

## 2021-01-23 DIAGNOSIS — I152 Hypertension secondary to endocrine disorders: Secondary | ICD-10-CM | POA: Insufficient documentation

## 2021-01-23 DIAGNOSIS — K5792 Diverticulitis of intestine, part unspecified, without perforation or abscess without bleeding: Secondary | ICD-10-CM | POA: Diagnosis not present

## 2021-01-23 DIAGNOSIS — E1159 Type 2 diabetes mellitus with other circulatory complications: Secondary | ICD-10-CM

## 2021-01-23 DIAGNOSIS — I10 Essential (primary) hypertension: Secondary | ICD-10-CM | POA: Diagnosis not present

## 2021-01-23 DIAGNOSIS — G8929 Other chronic pain: Secondary | ICD-10-CM | POA: Diagnosis not present

## 2021-01-23 DIAGNOSIS — D72829 Elevated white blood cell count, unspecified: Secondary | ICD-10-CM | POA: Diagnosis not present

## 2021-01-23 DIAGNOSIS — E669 Obesity, unspecified: Secondary | ICD-10-CM | POA: Diagnosis not present

## 2021-01-23 MED ORDER — WEGOVY 1 MG/0.5ML ~~LOC~~ SOAJ
1.0000 mg | SUBCUTANEOUS | 2 refills | Status: DC
Start: 2021-01-23 — End: 2021-01-31

## 2021-01-23 MED ORDER — WEGOVY 1.7 MG/0.75ML ~~LOC~~ SOAJ: 1.7000 mg | SUBCUTANEOUS | 2 refills | Status: DC

## 2021-01-23 MED ORDER — WEGOVY 2.4 MG/0.75ML ~~LOC~~ SOAJ: 2.4000 mg | SUBCUTANEOUS | 3 refills | Status: DC

## 2021-01-23 MED ORDER — OZEMPIC (0.25 OR 0.5 MG/DOSE) 2 MG/1.5ML ~~LOC~~ SOPN: 0.2500 mg | PEN_INJECTOR | SUBCUTANEOUS | 2 refills | Status: DC

## 2021-01-23 NOTE — Patient Instructions (Addendum)
Consider premier protein shakes    Semaglutide Injection (Weight Management) What is this medication? SEMAGLUTIDE (Sem a GLOO tide) is used to help people lose weight and maintainweight loss. It is used with a reduced-calorie diet and exercise. This medicine may be used for other purposes; ask your health care provider orpharmacist if you have questions. COMMON BRAND NAME(S): ZOXWRU What should I tell my care team before I take this medication? They need to know if you have any of these conditions: endocrine tumors (MEN 2) or if someone in your family had these tumors eye disease, vision problems gallbladder disease history of depression or mental health disease history of pancreatitis kidney disease stomach or intestine problems suicidal thoughts, plans, or attempt; a previous suicide attempt by you or a family member thyroid cancer or if someone in your family had thyroid cancer an unusual or allergic reaction to semaglutide, other medicines, foods, dyes, or preservatives pregnant or trying to get pregnant breast-feeding How should I use this medication? This medicine is injected under the skin. You will be taught how to prepare and give it. Take it as directed on the prescription label. It is given once every week (every 7 days). Keep taking it unless your health care provider tells youto stop. It is important that you put your used needles and pens in a special sharps container. Do not put them in a trash can. If you do not have a sharpscontainer, call your pharmacist or health care provider to get one. A special MedGuide will be given to you by the pharmacist with eachprescription and refill. Be sure to read this information carefully each time. This medicine comes with INSTRUCTIONS FOR USE. Ask your pharmacist for directions on how to use this medicine. Read the information carefully. Talk toyour pharmacist or health care provider if you have questions. Talk to your health care  provider about the use of this medicine in children.Special care may be needed. Overdosage: If you think you have taken too much of this medicine contact apoison control center or emergency room at once. NOTE: This medicine is only for you. Do not share this medicine with others. What if I miss a dose? If you miss a dose and the next scheduled dose is more than 2 days away, take the missed dose as soon as possible. If you miss a dose and the next scheduled dose is less than 2 days away, do not take the missed dose. Take the next dose at your regular time. Do not take double or extra doses. If you miss your dose for 2 weeks or more, take the next dose at your regular time or call yourhealth care provider to talk about how to restart this medicine. What may interact with this medication? insulin and other medicines for diabetes This list may not describe all possible interactions. Give your health care provider a list of all the medicines, herbs, non-prescription drugs, or dietary supplements you use. Also tell them if you smoke, drink alcohol, or use illegaldrugs. Some items may interact with your medicine. What should I watch for while using this medication? Visit your health care provider for regular checks on your progress. It may besome time before you see the benefit from this medicine. Drink plenty of fluids while taking this medicine. Check with your health care provider if you have severe diarrhea, nausea, and vomiting, or if you sweat a lot. The loss of too much body fluid may make it dangerous for you to take thismedicine. This  medicine may affect blood sugar levels. Ask your health care provider ifchanges in diet or medicines are needed if you have diabetes. If you or your family notice any changes in your behavior, such as new or worsening depression, thoughts of harming yourself, anxiety, other unusual ordisturbing thoughts, or memory loss, call your health care provider right away. Women  should inform their health care provider if they wish to become pregnant or think they might be pregnant. Losing weight while pregnant is not advised and may cause harm to the unborn child. Talk to your health care provider formore information. What side effects may I notice from receiving this medication? Side effects that you should report to your doctor or health care professionalas soon as possible: allergic reactions like skin rash, itching or hives, swelling of the face, lips, or tongue changes in vision fast heartbeat gallbladder problems (fever, upper belly pain, yellowing of the eyes or skin, clay-colored stool) kidney injury (trouble passing urine or change in the amount of urine) low blood sugar (feeling anxious; confusion; dizziness; increased hunger; unusually weak or tired; increased sweating; shakiness; cold, clammy skin; irritable; headache; blurred vision; loss of consciousness) lump or swelling on the neck pancreatitis (stomach pain that spreads to your back or gets worse after eating or when touched, fever, nausea, vomiting) suicidal thoughts, mood changes trouble breathing trouble swallowing Side effects that usually do not require medical attention (report these toyour doctor or health care professional if they continue or are bothersome): constipation diarrhea headache heartburn (burning feeling in chest, often after eating or when lying down) nausea pain, redness, or irritation at site where injected passing gas upset stomach vomiting This list may not describe all possible side effects. Call your doctor for medical advice about side effects. You may report side effects to FDA at1-800-FDA-1088. Where should I keep my medication? Keep out of the reach of children and pets. Refrigeration (preferred): Store in the refrigerator. Do not freeze. Keep this medicine in the original container until you are ready to take it. Get rid ofany unused medicine after the expiration  date. Room temperature: If needed, prior to cap removal, the pen can be stored at room temperature for up to 28 days. Protect from light. If it is stored at room temperature, get rid of any unused medicine after 28 days or after it expires,whichever is first. It is important to get rid of the medicine as soon as you no longer need it orit is expired. You can do this in two ways: Take the medicine to a medicine take-back program. Check with your pharmacy or law enforcement to find a location. If you cannot return the medicine, follow the directions in the [place]. NOTE: This sheet is a summary. It may not cover all possible information. If you have questions about this medicine, talk to your doctor, pharmacist, orhealth care provider.  2022 Elsevier/Gold Standard (2019-12-20 11:38:13)   Semaglutide injection solution What is this medication? SEMAGLUTIDE (Sem a GLOO tide) is used to improve blood sugar control in adults with type 2 diabetes. This medicine may be used with other diabetes medicines. This drug may also reduce the risk of heart attack or stroke if you have type 2diabetes and risk factors for heart disease. This medicine may be used for other purposes; ask your health care provider orpharmacist if you have questions. COMMON BRAND NAME(S): OZEMPIC What should I tell my care team before I take this medication? They need to know if you have any of these conditions:  endocrine tumors (MEN 2) or if someone in your family had these tumors eye disease, vision problems history of pancreatitis kidney disease stomach problems thyroid cancer or if someone in your family had thyroid cancer an unusual or allergic reaction to semaglutide, other medicines, foods, dyes, or preservatives pregnant or trying to get pregnant breast-feeding How should I use this medication? This medicine is for injection under the skin of your upper leg (thigh), stomach area, or upper arm. It is given once every week  (every 7 days). You will be taught how to prepare and give this medicine. Use exactly as directed. Take your medicine at regular intervals. Do not take it more often thandirected. If you use this medicine with insulin, you should inject this medicine and the insulin separately. Do not mix them together. Do not give the injections rightnext to each other. Change (rotate) injection sites with each injection. It is important that you put your used needles and syringes in a special sharps container. Do not put them in a trash can. If you do not have a sharpscontainer, call your pharmacist or healthcare provider to get one. A special MedGuide will be given to you by the pharmacist with eachprescription and refill. Be sure to read this information carefully each time. This drug comes with INSTRUCTIONS FOR USE. Ask your pharmacist for directions on how to use this drug. Read the information carefully. Talk to yourpharmacist or health care provider if you have questions. Talk to your pediatrician regarding the use of this medicine in children.Special care may be needed. Overdosage: If you think you have taken too much of this medicine contact apoison control center or emergency room at once. NOTE: This medicine is only for you. Do not share this medicine with others. What if I miss a dose? If you miss a dose, take it as soon as you can within 5 days after the missed dose. Then take your next dose at your regular weekly time. If it has been longer than 5 days after the missed dose, do not take the missed dose. Take the next dose at your regular time. Do not take double or extra doses. If you havequestions about a missed dose, contact your health care provider for advice. What may interact with this medication? other medicines for diabetes Many medications may cause changes in blood sugar, these include: alcohol containing beverages antiviral medicines for HIV or AIDS aspirin and aspirin-like drugs certain  medicines for blood pressure, heart disease, irregular heart beat chromium diuretics female hormones, such as estrogens or progestins, birth control pills fenofibrate gemfibrozil isoniazid lanreotide female hormones or anabolic steroids MAOIs like Carbex, Eldepryl, Marplan, Nardil, and Parnate medicines for weight loss medicines for allergies, asthma, cold, or cough medicines for depression, anxiety, or psychotic disturbances niacin nicotine NSAIDs, medicines for pain and inflammation, like ibuprofen or naproxen octreotide pasireotide pentamidine phenytoin probenecid quinolone antibiotics such as ciprofloxacin, levofloxacin, ofloxacin some herbal dietary supplements steroid medicines such as prednisone or cortisone sulfamethoxazole; trimethoprim thyroid hormones Some medications can hide the warning symptoms of low blood sugar (hypoglycemia). You may need to monitor your blood sugar more closely if youare taking one of these medications. These include: beta-blockers, often used for high blood pressure or heart problems (examples include atenolol, metoprolol, propranolol) clonidine guanethidine reserpine This list may not describe all possible interactions. Give your health care provider a list of all the medicines, herbs, non-prescription drugs, or dietary supplements you use. Also tell them if you smoke, drink alcohol, or use  illegaldrugs. Some items may interact with your medicine. What should I watch for while using this medication? Visit your doctor or health care professional for regular checks on yourprogress. Drink plenty of fluids while taking this medicine. Check with your doctor or health care professional if you get an attack of severe diarrhea, nausea, and vomiting. The loss of too much body fluid can make it dangerous for you to takethis medicine. A test called the HbA1C (A1C) will be monitored. This is a simple blood test. It measures your blood sugar control over the  last 2 to 3 months. You willreceive this test every 3 to 6 months. Learn how to check your blood sugar. Learn the symptoms of low and high bloodsugar and how to manage them. Always carry a quick-source of sugar with you in case you have symptoms of low blood sugar. Examples include hard sugar candy or glucose tablets. Make sure others know that you can choke if you eat or drink when you develop serious symptoms of low blood sugar, such as seizures or unconsciousness. They must getmedical help at once. Tell your doctor or health care professional if you have high blood sugar. You might need to change the dose of your medicine. If you are sick or exercisingmore than usual, you might need to change the dose of your medicine. Do not skip meals. Ask your doctor or health care professional if you should avoid alcohol. Many nonprescription cough and cold products contain sugar oralcohol. These can affect blood sugar. Pens should never be shared. Even if the needle is changed, sharing may resultin passing of viruses like hepatitis or HIV. Wear a medical ID bracelet or chain, and carry a card that describes yourdisease and details of your medicine and dosage times. Do not become pregnant while taking this medicine. Women should inform their doctor if they wish to become pregnant or think they might be pregnant. There is a potential for serious side effects to an unborn child. Talk to your healthcare professional or pharmacist for more information. What side effects may I notice from receiving this medication? Side effects that you should report to your doctor or health care professionalas soon as possible: allergic reactions like skin rash, itching or hives, swelling of the face, lips, or tongue breathing problems changes in vision diarrhea that continues or is severe lump or swelling on the neck severe nausea signs and symptoms of infection like fever or chills; cough; sore throat; pain or trouble passing  urine signs and symptoms of low blood sugar such as feeling anxious, confusion, dizziness, increased hunger, unusually weak or tired, sweating, shakiness, cold, irritable, headache, blurred vision, fast heartbeat, loss of consciousness signs and symptoms of kidney injury like trouble passing urine or change in the amount of urine trouble swallowing unusual stomach upset or pain vomiting Side effects that usually do not require medical attention (report to yourdoctor or health care professional if they continue or are bothersome): constipation diarrhea nausea pain, redness, or irritation at site where injected stomach upset This list may not describe all possible side effects. Call your doctor for medical advice about side effects. You may report side effects to FDA at1-800-FDA-1088. Where should I keep my medication? Keep out of the reach of children. Store unopened pens in a refrigerator between 2 and 8 degrees C (36 and 46 degrees F). Do not freeze. Protect from light and heat. After you first use the pen, it can be stored for 56 days at room temperature between 15  and 30 degrees C (59 and 86 degrees F) or in a refrigerator. Throw away your used pen after 56days or after the expiration date, whichever comes first. Do not store your pen with the needle attached. If the needle is left on,medicine may leak from the pen. NOTE: This sheet is a summary. It may not cover all possible information. If you have questions about this medicine, talk to your doctor, pharmacist, orhealth care provider.  2022 Elsevier/Gold Standard (2019-03-15 09:41:51)  Diverticulitis  Diverticulitis is infection or inflammation of small pouches (diverticula) in the colon that form due to a condition called diverticulosis. Diverticula can trap stool (feces) and bacteria, causing infection and inflammation. Diverticulitis may cause severe stomach pain and diarrhea. It may lead to tissue damage in the colon that causes  bleeding or blockage. The diverticula may also burst (rupture) and cause infected stool to enter other areas of the abdomen. What are the causes? This condition is caused by stool becoming trapped in the diverticula, which allows bacteria to grow in the diverticula. This leads to inflammation andinfection. What increases the risk? You are more likely to develop this condition if you have diverticulosis. The risk increases if you: Are overweight or obese. Do not get enough exercise. Drink alcohol. Use tobacco products. Eat a diet that has a lot of red meat such as beef, pork, or lamb. Eat a diet that does not include enough fiber. High-fiber foods include fruits, vegetables, beans, nuts, and whole grains. Are over 47 years of age. What are the signs or symptoms? Symptoms of this condition may include: Pain and tenderness in the abdomen. The pain is normally located on the left side of the abdomen, but it may occur in other areas. Fever and chills. Nausea. Vomiting. Cramping. Bloating. Changes in bowel routines. Blood in your stool. How is this diagnosed? This condition is diagnosed based on: Your medical history. A physical exam. Tests to make sure there is nothing else causing your condition. These tests may include: Blood tests. Urine tests. CT scan of the abdomen. How is this treated? Most cases of this condition are mild and can be treated at home. Treatment may include: Taking over-the-counter pain medicines. Following a clear liquid diet. Taking antibiotic medicines by mouth. Resting. More severe cases may need to be treated at a hospital. Treatment may include: Not eating or drinking. Taking prescription pain medicine. Receiving antibiotic medicines through an IV. Receiving fluids and nutrition through an IV. Surgery. When your condition is under control, your health care provider may recommend that you have a colonoscopy. This is an exam to look at the entire large  intestine. During the exam, a lubricated, bendable tube is inserted into the anus and then passed into the rectum, colon, and other parts of the large intestine. A colonoscopy can show how severe your diverticula are and whethersomething else may be causing your symptoms. Follow these instructions at home: Medicines Take over-the-counter and prescription medicines only as told by your health care provider. These include fiber supplements, probiotics, and stool softeners. If you were prescribed an antibiotic medicine, take it as told by your health care provider. Do not stop taking the antibiotic even if you start to feel better. Ask your health care provider if the medicine prescribed to you requires you to avoid driving or using machinery. Eating and drinking  Follow a full liquid diet or another diet as directed by your health care provider. After your symptoms improve, your health care provider may tell you  to change your diet. He or she may recommend that you eat a diet that contains at least 25 grams (25 g) of fiber daily. Fiber makes it easier to pass stool. Healthy sources of fiber include: Berries. One cup contains 4-8 grams of fiber. Beans or lentils. One-half cup contains 5-8 grams of fiber. Green vegetables. One cup contains 4 grams of fiber. Avoid eating red meat.  General instructions Do not use any products that contain nicotine or tobacco, such as cigarettes, e-cigarettes, and chewing tobacco. If you need help quitting, ask your health care provider. Exercise for at least 30 minutes, 3 times each week. You should exercise hard enough to raise your heart rate and break a sweat. Keep all follow-up visits as told by your health care provider. This is important. You may need to have a colonoscopy. Contact a health care provider if: Your pain does not improve. Your bowel movements do not return to normal. Get help right away if: Your pain gets worse. Your symptoms do not get better  with treatment. Your symptoms suddenly get worse. You have a fever. You vomit more than one time. You have stools that are bloody, black, or tarry. Summary Diverticulitis is infection or inflammation of small pouches (diverticula) in the colon that form due to a condition called diverticulosis. Diverticula can trap stool (feces) and bacteria, causing infection and inflammation. You are at higher risk for this condition if you have diverticulosis and you eat a diet that does not include enough fiber. Most cases of this condition are mild and can be treated at home. More severe cases may need to be treated at a hospital. When your condition is under control, your health care provider may recommend that you have an exam called a colonoscopy. This exam can show how severe your diverticula are and whether something else may be causing your symptoms. Keep all follow-up visits as told by your health care provider. This is important. This information is not intended to replace advice given to you by your health care provider. Make sure you discuss any questions you have with your healthcare provider. Document Revised: 04/11/2019 Document Reviewed: 04/11/2019 Elsevier Patient Education  2022 [employer].

## 2021-01-23 NOTE — Telephone Encounter (Signed)
Lft msg at Dr [facility] to follow up on referral to pain clinic. thanks

## 2021-01-23 NOTE — Addendum Note (Signed)
Addended by: Orland Mustard on: 01/23/2021 02:31 PM   Modules accepted: Orders

## 2021-01-23 NOTE — Progress Notes (Signed)
Chief Complaint  Patient presents with   Hospitalization Follow-up   HFU [facility] 6/20-6/29/22 for acute on chronic ab pain h/o recurrent diverticulitis given ib abx and given cipro/flagyl x 7 days denies constipation doing miralax qd LLQ, LMQ ab pain 4/10 and stabbing bentyl did not like in the past  Will ask h/o if ab pain could be related to persistent WBC elevation   AKI resolved by hospital discharge   Htn controlled on norvasc 10 mg qd   Obesity wants to try medication for wt loss unable to do adipex due to htn h/o and AKI with DM/HLD, HTN  Review of Systems  Constitutional:  Negative for weight loss.  HENT:  Negative for hearing loss.   Eyes:  Negative for blurred vision.  Respiratory:  Negative for shortness of breath.   Cardiovascular:  Negative for chest pain.  Gastrointestinal:  Positive for abdominal pain and nausea. Negative for constipation.  Musculoskeletal:  Positive for back pain.  Skin:  Negative for rash.  Psychiatric/Behavioral:  The patient is nervous/anxious.   Past Medical History:  Diagnosis Date   Anxiety    Asthma    Bipolar 1 disorder ([place])    Bipolar disorder ([place])    CAD (coronary artery disease)    s/p stent BMS OM Cx   Cervical herniated disc 04/12/2016   COPD (chronic obstructive pulmonary disease) (HCC)    Depression    Diabetes mellitus without complication (HCC)    Diverticulitis    GERD (gastroesophageal reflux disease)    Glaucoma    History of blood transfusion    Hyperlipidemia    Hypertension    OSA (obstructive sleep apnea)    not using cpap    Plantar fasciitis    b/l feet s/p steroid shots w/o help and left surgery w/o help    UTI (urinary tract infection)    Past Surgical History:  Procedure Laterality Date   ABDOMINAL HYSTERECTOMY     ABDOMINAL SURGERY  1995   Bowel resection.   CARDIAC CATHETERIZATION N/A 04/14/2016   Procedure: Left Heart Cath and Coronary Angiography;  Surgeon: Burnell Blanks, MD;  Location: [facility];  Service: Cardiovascular;  Laterality: N/A;   CARDIAC SURGERY     COLONOSCOPY WITH PROPOFOL N/A 07/11/2019   Procedure: COLONOSCOPY WITH PROPOFOL;  Surgeon: Jonathon Bellows, MD;  Location: [facility] ENDOSCOPY;  Service: Gastroenterology;  Laterality: N/A;   COLONOSCOPY WITH PROPOFOL N/A 07/29/2019   Procedure: COLONOSCOPY WITH PROPOFOL;  Surgeon: Jonathon Bellows, MD;  Location: [facility] ENDOSCOPY;  Service: Gastroenterology;  Laterality: N/A;   CORONARY ANGIOPLASTY WITH STENT PLACEMENT  2010   Drug eluting stent   ESOPHAGOGASTRODUODENOSCOPY (EGD) WITH PROPOFOL N/A 07/11/2019   Procedure: ESOPHAGOGASTRODUODENOSCOPY (EGD) WITH PROPOFOL;  Surgeon: Jonathon Bellows, MD;  Location: [facility] ENDOSCOPY;  Service: Gastroenterology;  Laterality: N/A;   LEFT HEART CATH AND CORONARY ANGIOGRAPHY Left 09/11/2020   Procedure: LEFT HEART CATH AND CORONARY ANGIOGRAPHY;  Surgeon: Nelva Bush, MD;  Location: [facility];  Service: Cardiovascular;  Laterality: Left;   OVARIAN CYST REMOVAL     Family History  Problem Relation Age of Onset   CAD Mother    Depression Mother    Heart disease Mother    Hyperlipidemia Mother    Hypertension Mother    CAD Brother    Depression Brother    Diabetes Brother    Heart disease Brother    Hyperlipidemia Brother    Heart disease Father    Alcohol abuse Father  Lupus Other    Sickle cell anemia Other    Social History   Socioeconomic History   Marital status: Single    Spouse name: Not on file   Number of children: 1   Years of education: Not on file   Highest education level: Not on file  Occupational History   Not on file  Tobacco Use   Smoking status: Former    Packs/day: 1.00    Years: 30.00    Pack years: 30.00    Types: Cigarettes    Quit date: 12/31/2020    Years since quitting: 0.0   Smokeless tobacco: Never   Tobacco comments:    Quit after being admitted on 6/20 01/17/2021  Vaping Use   Vaping Use: Never used  Substance and Sexual  Activity   Alcohol use: Not Currently    Comment: once a year   Drug use: Yes    Frequency: 7.0 times per week    Types: Marijuana   Sexual activity: Not on file  Other Topics Concern   Not on file  Social History Narrative   From [place] now living in [place]    1 son    No guns    Wears seat belt   Safe in relationship    Social Determinants of Health   Financial Resource Strain: Low Risk    Difficulty of Paying Living Expenses: Not very hard  Food Insecurity: No Food Insecurity   Worried About Charity fundraiser in the Last Year: Never true   Arboriculturist in the Last Year: Never true  Transportation Needs: No Transportation Needs   Lack of Transportation (Medical): No   Lack of Transportation (Non-Medical): No  Physical Activity: Not on file  Stress: No Stress Concern Present   Feeling of Stress : Not at all  Social Connections: Unknown   Frequency of Communication with Friends and Family: More than three times a week   Frequency of Social Gatherings with Friends and Family: More than three times a week   Attends Religious Services: Not on Electrical engineer or Organizations: Not on file   Attends Archivist Meetings: Not on file   Marital Status: Not on file  Intimate Partner Violence: Not At Risk   Fear of Current or Ex-Partner: No   Emotionally Abused: No   Physically Abused: No   Sexually Abused: No   Current Meds  Medication Sig   albuterol (VENTOLIN HFA) 108 (90 Base) MCG/ACT inhaler Inhale 1-2 puffs into the lungs every 6 (six) hours as needed for wheezing or shortness of breath.   amLODipine (NORVASC) 10 MG tablet Take 1 tablet (10 mg total) by mouth at bedtime.   aspirin 81 MG chewable tablet Chew 81 mg by mouth daily.   Budeson-Glycopyrrol-Formoterol (BREZTRI AEROSPHERE) 160-9-4.8 MCG/ACT AERO Inhale 2 puffs into the lungs in the morning and at bedtime. Rinse   clopidogrel (PLAVIX) 75 MG tablet Take 1 tablet (75 mg  total) by mouth daily.   divalproex (DEPAKOTE) 500 MG DR tablet TAKE 1 TABLET BY MOUTH  TWICE DAILY   Evolocumab (REPATHA SURECLICK) 606 MG/ML SOAJ Inject 1 mL into the skin every 14 (fourteen) days.   ezetimibe (ZETIA) 10 MG tablet TAKE 1 TABLET BY MOUTH  DAILY WITH LIPITOR 80 MG AT NIGHT   icosapent Ethyl (VASCEPA) 1 g capsule Take 2 capsules (2 g total) by mouth 2 (two) times daily.   isosorbide mononitrate (  IMDUR) 60 MG 24 hr tablet Take 1 tablet (60 mg total) by mouth daily.   methocarbamol (ROBAXIN) 500 MG tablet Take 1 tablet (500 mg total) by mouth 2 (two) times daily as needed for muscle spasms.   nitroGLYCERIN (NITROSTAT) 0.4 MG SL tablet DISSOLVE 1 TABLET UNDER  TONGUE EVERY 5 MINUTES AS  NEEDED FOR CHEST PAIN MAX  OF 3 TABLETS IN 15 MINUTES  . CALL 911 AFTER THIRD DOSE   pantoprazole (PROTONIX) 40 MG tablet TAKE 1 TABLET BY MOUTH  TWICE DAILY BEFORE MEALS   traZODone (DESYREL) 150 MG tablet Take 1 tablet (150 mg total) by mouth at bedtime as needed for sleep.   Vitamin D, Cholecalciferol, 25 MCG (1000 UT) CAPS Take 1 capsule by mouth daily.   Allergies  Allergen Reactions   Ativan [Lorazepam]     Pt states it makes her tongue do weird things    Augmentin [Amoxicillin-Pot Clavulanate]     Upset stomach    Latex Other (See Comments)    Patient stated that she was told by her doctor that she is "allergic to" this   Tape Other (See Comments)    Patient stated that she was told by her doctor that she is "allergic to" this   Xifaxan [Rifaximin]     Pt believes this is contributing to her abdominal pain/discomfort   Drixoral Allergy Sinus [Dexbromphen-Pse-Apap Er] Rash   Recent Results (from the past 2160 hour(s))  SARS CORONAVIRUS 2 (TAT 6-24 HRS) Nasopharyngeal Nasopharyngeal Swab     Status: None   Collection Time: 12/03/20  9:46 AM   Specimen: Nasopharyngeal Swab  Result Value Ref Range   SARS Coronavirus 2 NEGATIVE NEGATIVE    Comment: (NOTE) SARS-CoV-2 target nucleic  acids are NOT DETECTED.  The SARS-CoV-2 RNA is generally detectable in upper and lower respiratory specimens during the acute phase of infection. Negative results do not preclude SARS-CoV-2 infection, do not rule out co-infections with other pathogens, and should not be used as the sole basis for treatment or other patient management decisions. Negative results must be combined with clinical observations, patient history, and epidemiological information. The expected result is Negative.  Fact Sheet for Patients: SugarRoll.be  Fact Sheet for Healthcare Providers: https://www.woods-mathews.com/  This test is not yet approved or cleared by the [place] FDA and  has been authorized for detection and/or diagnosis of SARS-CoV-2 by FDA under an Emergency Use Authorization (EUA). This EUA will remain  in effect (meaning this test can be used) for the duration of the COVID-19 declaration under Se ction 564(b)(1) of the Act, 21 U.S.C. section 360bbb-3(b)(1), unless the authorization is terminated or revoked sooner.  Performed at [facility], [place] [address] [place], [place] [address]   Urinalysis, Complete w Microscopic Urine, Clean Catch     Status: Abnormal   Collection Time: 12/17/20  3:37 PM  Result Value Ref Range   Color, Urine YELLOW (A) YELLOW   APPearance HAZY (A) CLEAR   Specific Gravity, Urine 1.018 1.005 - 1.030   pH 7.0 5.0 - 8.0   Glucose, UA NEGATIVE NEGATIVE mg/dL   Hgb urine dipstick NEGATIVE NEGATIVE   Bilirubin Urine NEGATIVE NEGATIVE   Ketones, ur NEGATIVE NEGATIVE mg/dL   Protein, ur NEGATIVE NEGATIVE mg/dL   Nitrite NEGATIVE NEGATIVE   Leukocytes,Ua NEGATIVE NEGATIVE   RBC / HPF 0-5 0 - 5 RBC/hpf   WBC, UA 0-5 0 - 5 WBC/hpf   Bacteria, UA FEW (A) NONE SEEN  Squamous Epithelial / LPF 6-10 0 - 5   Mucus PRESENT    Amorphous Crystal PRESENT     Comment: Performed at [facility], [place], [place] [address]  Troponin I (High Sensitivity)     Status: None   Collection Time: 12/17/20  3:37 PM  Result Value Ref Range   Troponin I (High Sensitivity) 8 <18 ng/L    Comment: (NOTE) Elevated high sensitivity troponin I (hsTnI) values and significant  changes across serial measurements may suggest ACS but many other  chronic and acute conditions are known to elevate hsTnI results.  Refer to the "Links" section for chest pain algorithms and additional  guidance. Performed at [facility], [address] [place], [place] [address]   Lipase, blood     Status: None   Collection Time: 12/17/20  3:45 PM  Result Value Ref Range   Lipase 30 11 - 51 U/L    Comment: Performed at [facility], [address] [place], [place] [address]  Comprehensive metabolic panel     Status: Abnormal   Collection Time: 12/17/20  3:45 PM  Result Value Ref Range   Sodium 135 135 - 145 mmol/L   Potassium 4.4 3.5 - 5.1 mmol/L   Chloride 100 98 - 111 mmol/L   CO2 25 22 - 32 mmol/L   Glucose, Bld 120 (H) 70 - 99 mg/dL    Comment: Glucose reference range applies only to samples taken after fasting for at least 8 hours.   BUN 17 6 - 20 mg/dL   Creatinine, Ser 0.97 0.44 - 1.00 mg/dL   Calcium 8.9 8.9 - 10.3 mg/dL   Total Protein 6.5 6.5 - 8.1 g/dL   Albumin 3.8 3.5 - 5.0 g/dL   AST 18 15 - 41 U/L   ALT 33 0 - 44 U/L   Alkaline Phosphatase 52 38 - 126 U/L   Total Bilirubin 0.7 0.3 - 1.2 mg/dL   GFR, Estimated >60 >60 mL/min    Comment: (NOTE) Calculated using the CKD-EPI Creatinine Equation (2021)    Anion gap 10 5 - 15    Comment: Performed at [facility], [address] [place], [place] [address]  CBC     Status: Abnormal   Collection Time: 12/17/20  3:45 PM  Result Value Ref Range   WBC 20.3 (H) 4.0 - 10.5 K/uL   RBC 3.79 (L) 3.87 - 5.11 MIL/uL   Hemoglobin 11.6 (L) 12.0 - 15.0 g/dL   HCT 35.0 (L) 36.0 - 46.0 %   MCV 92.3 80.0 - 100.0 fL   MCH 30.6  26.0 - 34.0 pg   MCHC 33.1 30.0 - 36.0 g/dL   RDW 14.7 11.5 - 15.5 %   Platelets 465 (H) 150 - 400 K/uL   nRBC 0.0 0.0 - 0.2 %    Comment: Performed at [facility], [place], [place] [address]  Troponin I (High Sensitivity)     Status: None   Collection Time: 12/17/20  6:00 PM  Result Value Ref Range   Troponin I (High Sensitivity) 6 <18 ng/L    Comment: (NOTE) Elevated high sensitivity troponin I (hsTnI) values and significant  changes across serial measurements may suggest ACS but many other  chronic and acute conditions are known to elevate hsTnI results.  Refer to the "Links" section for chest pain algorithms and additional  guidance. Performed at [facility], [address], [place], [place] [address]  Resp Panel by RT-PCR (Flu A&B, Covid) Nasopharyngeal Swab     Status: None   Collection Time: 12/17/20  7:01 PM   Specimen: Nasopharyngeal Swab; Nasopharyngeal(NP) swabs in vial transport medium  Result Value Ref Range   SARS Coronavirus 2 by RT PCR NEGATIVE NEGATIVE    Comment: (NOTE) SARS-CoV-2 target nucleic acids are NOT DETECTED.  The SARS-CoV-2 RNA is generally detectable in upper respiratory specimens during the acute phase of infection. The lowest concentration of SARS-CoV-2 viral copies this assay can detect is 138 copies/mL. A negative result does not preclude SARS-Cov-2 infection and should not be used as the sole basis for treatment or other patient management decisions. A negative result may occur with  improper specimen collection/handling, submission of specimen other than nasopharyngeal swab, presence of viral mutation(s) within the areas targeted by this assay, and inadequate number of viral copies(<138 copies/mL). A negative result must be combined with clinical observations, patient history, and epidemiological information. The expected result is Negative.  Fact Sheet for Patients:   EntrepreneurPulse.com.au  Fact Sheet for Healthcare Providers:  IncredibleEmployment.be  This test is no t yet approved or cleared by the [place] FDA and  has been authorized for detection and/or diagnosis of SARS-CoV-2 by FDA under an Emergency Use Authorization (EUA). This EUA will remain  in effect (meaning this test can be used) for the duration of the COVID-19 declaration under Section 564(b)(1) of the Act, 21 U.S.C.section 360bbb-3(b)(1), unless the authorization is terminated  or revoked sooner.       Influenza A by PCR NEGATIVE NEGATIVE   Influenza B by PCR NEGATIVE NEGATIVE    Comment: (NOTE) The Xpert Xpress SARS-CoV-2/FLU/RSV plus assay is intended as an aid in the diagnosis of influenza from Nasopharyngeal swab specimens and should not be used as a sole basis for treatment. Nasal washings and aspirates are unacceptable for Xpert Xpress SARS-CoV-2/FLU/RSV testing.  Fact Sheet for Patients: EntrepreneurPulse.com.au  Fact Sheet for Healthcare Providers: IncredibleEmployment.be  This test is not yet approved or cleared by the [place] FDA and has been authorized for detection and/or diagnosis of SARS-CoV-2 by FDA under an Emergency Use Authorization (EUA). This EUA will remain in effect (meaning this test can be used) for the duration of the COVID-19 declaration under Section 564(b)(1) of the Act, 21 U.S.C. section 360bbb-3(b)(1), unless the authorization is terminated or revoked.  Performed at [facility], [address] [place], [place] [address]   CBC     Status: Abnormal   Collection Time: 12/18/20  4:09 AM  Result Value Ref Range   WBC 16.2 (H) 4.0 - 10.5 K/uL   RBC 3.56 (L) 3.87 - 5.11 MIL/uL   Hemoglobin 10.9 (L) 12.0 - 15.0 g/dL   HCT 32.9 (L) 36.0 - 46.0 %   MCV 92.4 80.0 - 100.0 fL   MCH 30.6 26.0 - 34.0 pg   MCHC 33.1 30.0 - 36.0 g/dL   RDW 14.7 11.5 -  15.5 %   Platelets 421 (H) 150 - 400 K/uL   nRBC 0.0 0.0 - 0.2 %    Comment: Performed at [facility], [address] [place], [place] [address]  Comprehensive metabolic panel     Status: Abnormal   Collection Time: 12/18/20  4:09 AM  Result Value Ref Range   Sodium 133 (L) 135 - 145 mmol/L   Potassium 4.0 3.5 - 5.1 mmol/L    Comment: HEMOLYSIS AT THIS LEVEL MAY AFFECT RESULT   Chloride 100 98 -  111 mmol/L   CO2 26 22 - 32 mmol/L   Glucose, Bld 111 (H) 70 - 99 mg/dL    Comment: Glucose reference range applies only to samples taken after fasting for at least 8 hours.   BUN 16 6 - 20 mg/dL   Creatinine, Ser 0.92 0.44 - 1.00 mg/dL   Calcium 8.6 (L) 8.9 - 10.3 mg/dL   Total Protein 6.5 6.5 - 8.1 g/dL   Albumin 3.5 3.5 - 5.0 g/dL   AST 17 15 - 41 U/L   ALT 28 0 - 44 U/L   Alkaline Phosphatase 49 38 - 126 U/L   Total Bilirubin 0.8 0.3 - 1.2 mg/dL   GFR, Estimated >60 >60 mL/min    Comment: (NOTE) Calculated using the CKD-EPI Creatinine Equation (2021)    Anion gap 7 5 - 15    Comment: Performed at [facility], [address] [place], [place] [address]  HIV Antibody (routine testing w rflx)     Status: None   Collection Time: 12/18/20  4:09 AM  Result Value Ref Range   HIV Screen 4th Generation wRfx Non Reactive Non Reactive    Comment: Performed at [facility] [address] [place], [place] [address]  CBC     Status: Abnormal   Collection Time: 12/19/20  4:28 AM  Result Value Ref Range   WBC 12.8 (H) 4.0 - 10.5 K/uL   RBC 3.50 (L) 3.87 - 5.11 MIL/uL   Hemoglobin 10.8 (L) 12.0 - 15.0 g/dL   HCT 32.5 (L) 36.0 - 46.0 %   MCV 92.9 80.0 - 100.0 fL   MCH 30.9 26.0 - 34.0 pg   MCHC 33.2 30.0 - 36.0 g/dL   RDW 14.5 11.5 - 15.5 %   Platelets 414 (H) 150 - 400 K/uL   nRBC 0.0 0.0 - 0.2 %    Comment: Performed at [facility], [address], [place], [place] [address]  Basic metabolic panel     Status: Abnormal   Collection Time: 12/19/20   4:28 AM  Result Value Ref Range   Sodium 137 135 - 145 mmol/L   Potassium 3.8 3.5 - 5.1 mmol/L   Chloride 102 98 - 111 mmol/L   CO2 27 22 - 32 mmol/L   Glucose, Bld 111 (H) 70 - 99 mg/dL    Comment: Glucose reference range applies only to samples taken after fasting for at least 8 hours.   BUN 5 (L) 6 - 20 mg/dL   Creatinine, Ser 0.66 0.44 - 1.00 mg/dL   Calcium 8.2 (L) 8.9 - 10.3 mg/dL   GFR, Estimated >60 >60 mL/min    Comment: (NOTE) Calculated using the CKD-EPI Creatinine Equation (2021)    Anion gap 8 5 - 15    Comment: Performed at [facility], [address] [place], [place] [address]  Lipid Profile     Status: Abnormal   Collection Time: 12/31/20  8:54 AM  Result Value Ref Range   Cholesterol, Total 158 100 - 199 mg/dL   Triglycerides 275 (H) 0 - 149 mg/dL   HDL 42 >39 mg/dL   VLDL Cholesterol Cal 44 (H) 5 - 40 mg/dL   LDL Chol Calc (NIH) 72 0 - 99 mg/dL   Chol/HDL Ratio 3.8 0.0 - 4.4 ratio    Comment:  T. Chol/HDL Ratio                                             Men  Women                               1/2 Avg.Risk  3.4    3.3                                   Avg.Risk  5.0    4.4                                2X Avg.Risk  9.6    7.1                                3X Avg.Risk 23.4   11.0   Lipase, blood     Status: None   Collection Time: 12/31/20  3:15 PM  Result Value Ref Range   Lipase 28 11 - 51 U/L    Comment: Performed at [facility], [address] [place], [place] [address]  Comprehensive metabolic panel     Status: Abnormal   Collection Time: 12/31/20  3:15 PM  Result Value Ref Range   Sodium 137 135 - 145 mmol/L   Potassium 2.9 (L) 3.5 - 5.1 mmol/L   Chloride 99 98 - 111 mmol/L   CO2 27 22 - 32 mmol/L   Glucose, Bld 135 (H) 70 - 99 mg/dL    Comment: Glucose reference range applies only to samples taken after fasting for at least 8 hours.   BUN 17 6 - 20 mg/dL   Creatinine, Ser 1.90 (H) 0.44 -  1.00 mg/dL   Calcium 8.9 8.9 - 10.3 mg/dL   Total Protein 7.4 6.5 - 8.1 g/dL   Albumin 4.2 3.5 - 5.0 g/dL   AST 50 (H) 15 - 41 U/L   ALT 104 (H) 0 - 44 U/L   Alkaline Phosphatase 79 38 - 126 U/L   Total Bilirubin 0.6 0.3 - 1.2 mg/dL   GFR, Estimated 31 (L) >60 mL/min    Comment: (NOTE) Calculated using the CKD-EPI Creatinine Equation (2021)    Anion gap 11 5 - 15    Comment: Performed at [facility], [address] [place], [place] [address]  CBC     Status: Abnormal   Collection Time: 12/31/20  3:15 PM  Result Value Ref Range   WBC 14.9 (H) 4.0 - 10.5 K/uL   RBC 4.43 3.87 - 5.11 MIL/uL   Hemoglobin 13.6 12.0 - 15.0 g/dL   HCT 39.0 36.0 - 46.0 %   MCV 88.0 80.0 - 100.0 fL   MCH 30.7 26.0 - 34.0 pg   MCHC 34.9 30.0 - 36.0 g/dL   RDW 13.6 11.5 - 15.5 %   Platelets 374 150 - 400 K/uL   nRBC 0.0 0.0 - 0.2 %    Comment: Performed at [facility], [address] [place], [place] [address]  Magnesium     Status: None   Collection Time: 12/31/20  5:57 PM  Result Value Ref  Range   Magnesium 2.4 1.7 - 2.4 mg/dL    Comment: Performed at [facility], [address] [place], [place] [address]  CULTURE, BLOOD (ROUTINE X 2) w Reflex to ID Panel     Status: None   Collection Time: 12/31/20  7:19 PM   Specimen: BLOOD  Result Value Ref Range   Specimen Description BLOOD RIGHT ANTECUBITAL    Special Requests      BOTTLES DRAWN AEROBIC AND ANAEROBIC Blood Culture adequate volume   Culture      NO GROWTH 5 DAYS Performed at [facility], [address] [place], [place] [address]    Report Status 01/05/2021 FINAL   CULTURE, BLOOD (ROUTINE X 2) w Reflex to ID Panel     Status: None   Collection Time: 12/31/20  7:25 PM   Specimen: BLOOD  Result Value Ref Range   Specimen Description BLOOD BLOOD RIGHT HAND    Special Requests      BOTTLES DRAWN AEROBIC AND ANAEROBIC Blood Culture adequate volume   Culture      NO GROWTH 5 DAYS Performed at  [facility], [address] [place], [place] [address]    Report Status 01/05/2021 FINAL   Resp Panel by RT-PCR (Flu A&B, Covid) Nasopharyngeal Swab     Status: None   Collection Time: 12/31/20  9:13 PM   Specimen: Nasopharyngeal Swab; Nasopharyngeal(NP) swabs in vial transport medium  Result Value Ref Range   SARS Coronavirus 2 by RT PCR NEGATIVE NEGATIVE    Comment: (NOTE) SARS-CoV-2 target nucleic acids are NOT DETECTED.  The SARS-CoV-2 RNA is generally detectable in upper respiratory specimens during the acute phase of infection. The lowest concentration of SARS-CoV-2 viral copies this assay can detect is 138 copies/mL. A negative result does not preclude SARS-Cov-2 infection and should not be used as the sole basis for treatment or other patient management decisions. A negative result may occur with  improper specimen collection/handling, submission of specimen other than nasopharyngeal swab, presence of viral mutation(s) within the areas targeted by this assay, and inadequate number of viral copies(<138 copies/mL). A negative result must be combined with clinical observations, patient history, and epidemiological information. The expected result is Negative.  Fact Sheet for Patients:  EntrepreneurPulse.com.au  Fact Sheet for Healthcare Providers:  IncredibleEmployment.be  This test is no t yet approved or cleared by the [place] FDA and  has been authorized for detection and/or diagnosis of SARS-CoV-2 by FDA under an Emergency Use Authorization (EUA). This EUA will remain  in effect (meaning this test can be used) for the duration of the COVID-19 declaration under Section 564(b)(1) of the Act, 21 U.S.C.section 360bbb-3(b)(1), unless the authorization is terminated  or revoked sooner.       Influenza A by PCR NEGATIVE NEGATIVE   Influenza B by PCR NEGATIVE NEGATIVE    Comment: (NOTE) The Xpert Xpress  SARS-CoV-2/FLU/RSV plus assay is intended as an aid in the diagnosis of influenza from Nasopharyngeal swab specimens and should not be used as a sole basis for treatment. Nasal washings and aspirates are unacceptable for Xpert Xpress SARS-CoV-2/FLU/RSV testing.  Fact Sheet for Patients: EntrepreneurPulse.com.au  Fact Sheet for Healthcare Providers: IncredibleEmployment.be  This test is not yet approved or cleared by the [place] FDA and has been authorized for detection and/or diagnosis of SARS-CoV-2 by FDA under an Emergency Use Authorization (EUA). This EUA will remain in effect (meaning this test can be used) for the duration of the COVID-19  declaration under Section 564(b)(1) of the Act, 21 U.S.C. section 360bbb-3(b)(1), unless the authorization is terminated or revoked.  Performed at [facility], [address] [place], [place] [address]   Urinalysis, Complete w Microscopic Urine, Clean Catch     Status: Abnormal   Collection Time: 12/31/20 11:30 PM  Result Value Ref Range   Color, Urine AMBER (A) YELLOW    Comment: BIOCHEMICALS MAY BE AFFECTED BY COLOR   APPearance HAZY (A) CLEAR   Specific Gravity, Urine 1.024 1.005 - 1.030   pH 5.0 5.0 - 8.0   Glucose, UA NEGATIVE NEGATIVE mg/dL   Hgb urine dipstick NEGATIVE NEGATIVE   Bilirubin Urine NEGATIVE NEGATIVE   Ketones, ur NEGATIVE NEGATIVE mg/dL   Protein, ur NEGATIVE NEGATIVE mg/dL   Nitrite NEGATIVE NEGATIVE   Leukocytes,Ua TRACE (A) NEGATIVE   RBC / HPF 0-5 0 - 5 RBC/hpf   WBC, UA 0-5 0 - 5 WBC/hpf   Bacteria, UA FEW (A) NONE SEEN   Squamous Epithelial / LPF 0-5 0 - 5   Mucus PRESENT     Comment: Performed at [facility], [address] [place], [place] [address]  Comprehensive metabolic panel     Status: Abnormal   Collection Time: 01/01/21  4:22 AM  Result Value Ref Range   Sodium 135 135 - 145 mmol/L   Potassium 3.0 (L) 3.5 - 5.1 mmol/L   Chloride  98 98 - 111 mmol/L   CO2 29 22 - 32 mmol/L   Glucose, Bld 134 (H) 70 - 99 mg/dL    Comment: Glucose reference range applies only to samples taken after fasting for at least 8 hours.   BUN 18 6 - 20 mg/dL   Creatinine, Ser 1.42 (H) 0.44 - 1.00 mg/dL   Calcium 8.2 (L) 8.9 - 10.3 mg/dL   Total Protein 6.0 (L) 6.5 - 8.1 g/dL   Albumin 3.3 (L) 3.5 - 5.0 g/dL   AST 35 15 - 41 U/L   ALT 75 (H) 0 - 44 U/L   Alkaline Phosphatase 60 38 - 126 U/L   Total Bilirubin 0.6 0.3 - 1.2 mg/dL   GFR, Estimated 44 (L) >60 mL/min    Comment: (NOTE) Calculated using the CKD-EPI Creatinine Equation (2021)    Anion gap 8 5 - 15    Comment: Performed at [facility], [address] [place], [place] [address]  CBC     Status: Abnormal   Collection Time: 01/01/21  4:22 AM  Result Value Ref Range   WBC 13.1 (H) 4.0 - 10.5 K/uL   RBC 3.55 (L) 3.87 - 5.11 MIL/uL   Hemoglobin 11.0 (L) 12.0 - 15.0 g/dL   HCT 31.5 (L) 36.0 - 46.0 %   MCV 88.7 80.0 - 100.0 fL   MCH 31.0 26.0 - 34.0 pg   MCHC 34.9 30.0 - 36.0 g/dL   RDW 13.7 11.5 - 15.5 %   Platelets 291 150 - 400 K/uL   nRBC 0.0 0.0 - 0.2 %    Comment: Performed at [facility], [address] [place], [place] [address]  Occult blood card to lab, stool     Status: None   Collection Time: 01/01/21  4:45 PM  Result Value Ref Range   Fecal Occult Bld NEGATIVE NEGATIVE    Comment: Performed at [facility], [address], [place], [place] [address]  CBC     Status: Abnormal   Collection Time: 01/02/21  5:00 AM  Result  Value Ref Range   WBC 9.6 4.0 - 10.5 K/uL   RBC 3.40 (L) 3.87 - 5.11 MIL/uL   Hemoglobin 10.4 (L) 12.0 - 15.0 g/dL   HCT 30.9 (L) 36.0 - 46.0 %   MCV 90.9 80.0 - 100.0 fL   MCH 30.6 26.0 - 34.0 pg   MCHC 33.7 30.0 - 36.0 g/dL   RDW 13.7 11.5 - 15.5 %   Platelets 340 150 - 400 K/uL   nRBC 0.0 0.0 - 0.2 %    Comment: Performed at [facility], [address], [place], [place] [address]  Basic  metabolic panel     Status: Abnormal   Collection Time: 01/02/21  5:00 AM  Result Value Ref Range   Sodium 139 135 - 145 mmol/L   Potassium 3.7 3.5 - 5.1 mmol/L   Chloride 102 98 - 111 mmol/L   CO2 31 22 - 32 mmol/L   Glucose, Bld 124 (H) 70 - 99 mg/dL    Comment: Glucose reference range applies only to samples taken after fasting for at least 8 hours.   BUN 11 6 - 20 mg/dL   Creatinine, Ser 0.82 0.44 - 1.00 mg/dL   Calcium 8.5 (L) 8.9 - 10.3 mg/dL   GFR, Estimated >60 >60 mL/min    Comment: (NOTE) Calculated using the CKD-EPI Creatinine Equation (2021)    Anion gap 6 5 - 15    Comment: Performed at [facility], [address] [place], [place] [address]  Comprehensive metabolic panel     Status: Abnormal   Collection Time: 01/03/21  5:10 AM  Result Value Ref Range   Sodium 140 135 - 145 mmol/L   Potassium 3.7 3.5 - 5.1 mmol/L   Chloride 103 98 - 111 mmol/L   CO2 29 22 - 32 mmol/L   Glucose, Bld 112 (H) 70 - 99 mg/dL    Comment: Glucose reference range applies only to samples taken after fasting for at least 8 hours.   BUN <5 (L) 6 - 20 mg/dL   Creatinine, Ser 0.79 0.44 - 1.00 mg/dL   Calcium 8.6 (L) 8.9 - 10.3 mg/dL   Total Protein 5.7 (L) 6.5 - 8.1 g/dL   Albumin 3.1 (L) 3.5 - 5.0 g/dL   AST 19 15 - 41 U/L   ALT 43 0 - 44 U/L   Alkaline Phosphatase 61 38 - 126 U/L   Total Bilirubin 0.4 0.3 - 1.2 mg/dL   GFR, Estimated >60 >60 mL/min    Comment: (NOTE) Calculated using the CKD-EPI Creatinine Equation (2021)    Anion gap 8 5 - 15    Comment: Performed at [facility], [address] [place], [place] [address]  CBC with Differential/Platelet     Status: Abnormal   Collection Time: 01/03/21  5:10 AM  Result Value Ref Range   WBC 10.3 4.0 - 10.5 K/uL   RBC 3.44 (L) 3.87 - 5.11 MIL/uL   Hemoglobin 10.4 (L) 12.0 - 15.0 g/dL   HCT 30.9 (L) 36.0 - 46.0 %   MCV 89.8 80.0 - 100.0 fL   MCH 30.2 26.0 - 34.0 pg   MCHC 33.7 30.0 - 36.0 g/dL   RDW 13.7 11.5  - 15.5 %   Platelets 397 150 - 400 K/uL   nRBC 0.0 0.0 - 0.2 %   Neutrophils Relative % 43 %   Neutro Abs 4.4 1.7 - 7.7 K/uL   Lymphocytes Relative 41 %   Lymphs Abs 4.2 (H)  0.7 - 4.0 K/uL   Monocytes Relative 8 %   Monocytes Absolute 0.8 0.1 - 1.0 K/uL   Eosinophils Relative 5 %   Eosinophils Absolute 0.5 0.0 - 0.5 K/uL   Basophils Relative 1 %   Basophils Absolute 0.1 0.0 - 0.1 K/uL   WBC Morphology ATYPICAL LYMPH    Smear Review Normal platelet morphology    Immature Granulocytes 2 %   Abs Immature Granulocytes 0.20 (H) 0.00 - 0.07 K/uL   Rouleaux PRESENT     Comment: Performed at [facility], [address] [place], [place] [address]  Magnesium     Status: None   Collection Time: 01/03/21  5:10 AM  Result Value Ref Range   Magnesium 1.9 1.7 - 2.4 mg/dL    Comment: Performed at [facility], [address] [place], [place] [address]  Basic metabolic panel     Status: Abnormal   Collection Time: 01/04/21  6:38 AM  Result Value Ref Range   Sodium 139 135 - 145 mmol/L   Potassium 4.0 3.5 - 5.1 mmol/L   Chloride 102 98 - 111 mmol/L   CO2 28 22 - 32 mmol/L   Glucose, Bld 122 (H) 70 - 99 mg/dL    Comment: Glucose reference range applies only to samples taken after fasting for at least 8 hours.   BUN <5 (L) 6 - 20 mg/dL   Creatinine, Ser 0.79 0.44 - 1.00 mg/dL   Calcium 9.4 8.9 - 10.3 mg/dL   GFR, Estimated >60 >60 mL/min    Comment: (NOTE) Calculated using the CKD-EPI Creatinine Equation (2021)    Anion gap 9 5 - 15    Comment: Performed at [facility], [address] [place], [place] [address]  CBC     Status: Abnormal   Collection Time: 01/04/21  6:38 AM  Result Value Ref Range   WBC 10.2 4.0 - 10.5 K/uL   RBC 3.51 (L) 3.87 - 5.11 MIL/uL   Hemoglobin 10.8 (L) 12.0 - 15.0 g/dL   HCT 31.3 (L) 36.0 - 46.0 %   MCV 89.2 80.0 - 100.0 fL   MCH 30.8 26.0 - 34.0 pg   MCHC 34.5 30.0 - 36.0 g/dL   RDW 13.8 11.5 - 15.5 %   Platelets 478 (H)  150 - 400 K/uL   nRBC 0.0 0.0 - 0.2 %    Comment: Performed at [facility], [address], [place],  [address]  Basic metabolic panel     Status: Abnormal   Collection Time: 01/05/21  8:25 AM  Result Value Ref Range   Sodium 140 135 - 145 mmol/L   Potassium 4.2 3.5 - 5.1 mmol/L   Chloride 105 98 - 111 mmol/L   CO2 27 22 - 32 mmol/L   Glucose, Bld 124 (H) 70 - 99 mg/dL    Comment: Glucose reference range applies only to samples taken after fasting for at least 8 hours.   BUN 7 6 - 20 mg/dL   Creatinine, Ser 0.75 0.44 - 1.00 mg/dL   Calcium 8.9 8.9 - 10.3 mg/dL   GFR, Estimated >60 >60 mL/min    Comment: (NOTE) Calculated using the CKD-EPI Creatinine Equation (2021)    Anion gap 8 5 - 15    Comment: Performed at [facility], [address] [place],  [address]  CBC with Differential/Platelet     Status: Abnormal   Collection Time: 01/05/21  8:25 AM  Result Value Ref Range  WBC 13.4 (H) 4.0 - 10.5 K/uL   RBC 3.48 (L) 3.87 - 5.11 MIL/uL   Hemoglobin 10.7 (L) 12.0 - 15.0 g/dL   HCT 31.8 (L) 36.0 - 46.0 %   MCV 91.4 80.0 - 100.0 fL   MCH 30.7 26.0 - 34.0 pg   MCHC 33.6 30.0 - 36.0 g/dL   RDW 13.8 11.5 - 15.5 %   Platelets 533 (H) 150 - 400 K/uL   nRBC 0.0 0.0 - 0.2 %   Neutrophils Relative % 56 %   Neutro Abs 7.3 1.7 - 7.7 K/uL   Lymphocytes Relative 28 %   Lymphs Abs 3.8 0.7 - 4.0 K/uL   Monocytes Relative 7 %   Monocytes Absolute 1.0 0.1 - 1.0 K/uL   Eosinophils Relative 3 %   Eosinophils Absolute 0.4 0.0 - 0.5 K/uL   Basophils Relative 1 %   Basophils Absolute 0.2 (H) 0.0 - 0.1 K/uL   Immature Granulocytes 5 %   Abs Immature Granulocytes 0.71 (H) 0.00 - 0.07 K/uL    Comment: Performed at [facility], [address] [place], [place] [address]  Magnesium     Status: None   Collection Time: 01/05/21  8:25 AM  Result Value Ref Range   Magnesium 2.0 1.7 - 2.4 mg/dL    Comment: Performed at [facility], [address], [place], [place] [address]  Basic metabolic panel     Status: Abnormal   Collection Time: 01/06/21  6:12 AM  Result Value Ref Range   Sodium 138 135 - 145 mmol/L   Potassium 3.9 3.5 - 5.1 mmol/L   Chloride 105 98 - 111 mmol/L   CO2 26 22 - 32 mmol/L   Glucose, Bld 132 (H) 70 - 99 mg/dL    Comment: Glucose reference range applies only to samples taken after fasting for at least 8 hours.   BUN 10 6 - 20 mg/dL   Creatinine, Ser 0.79 0.44 - 1.00 mg/dL   Calcium 9.2 8.9 - 10.3 mg/dL   GFR, Estimated >60 >60 mL/min    Comment: (NOTE) Calculated using the CKD-EPI Creatinine Equation (2021)    Anion gap 7 5 - 15    Comment: Performed at [facility], [address] [place], [place] [address]  Magnesium     Status: None   Collection Time: 01/06/21  6:12 AM  Result Value Ref Range   Magnesium 1.7 1.7 - 2.4 mg/dL    Comment: Performed at [facility], [address] [place], [place] [address]  CBC with Differential/Platelet     Status: Abnormal   Collection Time: 01/06/21  6:12 AM  Result Value Ref Range   WBC 24.1 (H) 4.0 - 10.5 K/uL   RBC 3.68 (L) 3.87 - 5.11 MIL/uL   Hemoglobin 11.6 (L) 12.0 - 15.0 g/dL   HCT 33.4 (L) 36.0 - 46.0 %   MCV 90.8 80.0 - 100.0 fL   MCH 31.5 26.0 - 34.0 pg   MCHC 34.7 30.0 - 36.0 g/dL   RDW 14.3 11.5 - 15.5 %   Platelets 620 (H) 150 - 400 K/uL   nRBC 0.0 0.0 - 0.2 %   Neutrophils Relative % 77 %   Neutro Abs 18.7 (H) 1.7 - 7.7 K/uL   Lymphocytes Relative 11 %   Lymphs Abs 2.7 0.7 - 4.0 K/uL   Monocytes Relative 8 %   Monocytes Absolute 1.9 (H) 0.1 - 1.0 K/uL   Eosinophils Relative 0 %  Eosinophils Absolute 0.0 0.0 - 0.5 K/uL   Basophils Relative 1 %   Basophils Absolute 0.1 0.0 - 0.1 K/uL   Immature Granulocytes 3 %   Abs Immature Granulocytes 0.71 (H) 0.00 - 0.07 K/uL    Comment: Performed at [facility], [address] [place], [place] [address]  Gastrointestinal Panel by PCR , Stool     Status: None    Collection Time: 01/06/21  9:23 PM   Specimen: Stool  Result Value Ref Range   Campylobacter species NOT DETECTED NOT DETECTED   Plesimonas shigelloides NOT DETECTED NOT DETECTED   Salmonella species NOT DETECTED NOT DETECTED   Yersinia enterocolitica NOT DETECTED NOT DETECTED   Vibrio species NOT DETECTED NOT DETECTED   Vibrio cholerae NOT DETECTED NOT DETECTED   Enteroaggregative E coli (EAEC) NOT DETECTED NOT DETECTED   Enteropathogenic E coli (EPEC) NOT DETECTED NOT DETECTED   Enterotoxigenic E coli (ETEC) NOT DETECTED NOT DETECTED   Shiga like toxin producing E coli (STEC) NOT DETECTED NOT DETECTED   Shigella/Enteroinvasive E coli (EIEC) NOT DETECTED NOT DETECTED   Cryptosporidium NOT DETECTED NOT DETECTED   Cyclospora cayetanensis NOT DETECTED NOT DETECTED   Entamoeba histolytica NOT DETECTED NOT DETECTED   Giardia lamblia NOT DETECTED NOT DETECTED   Adenovirus F40/41 NOT DETECTED NOT DETECTED   Astrovirus NOT DETECTED NOT DETECTED   Norovirus GI/GII NOT DETECTED NOT DETECTED   Rotavirus A NOT DETECTED NOT DETECTED   Sapovirus (I, II, IV, and V) NOT DETECTED NOT DETECTED    Comment: Performed at [facility], [address] [place], [place] [address]  C Difficile Quick Screen (NO PCR Reflex)     Status: None   Collection Time: 01/06/21  9:23 PM   Specimen: Stool  Result Value Ref Range   C Diff antigen NEGATIVE NEGATIVE   C Diff toxin NEGATIVE NEGATIVE   C Diff interpretation No C. difficile detected.     Comment: Performed at [facility], [address] [place], [place] [address]  Magnesium     Status: None   Collection Time: 01/07/21  6:59 AM  Result Value Ref Range   Magnesium 2.1 1.7 - 2.4 mg/dL    Comment: Performed at [facility], [address] [place], [place] [address]  Basic metabolic panel     Status: Abnormal   Collection Time: 01/07/21  6:59 AM  Result Value Ref Range   Sodium 138 135 - 145 mmol/L   Potassium 4.2 3.5 - 5.1  mmol/L   Chloride 104 98 - 111 mmol/L   CO2 28 22 - 32 mmol/L   Glucose, Bld 109 (H) 70 - 99 mg/dL    Comment: Glucose reference range applies only to samples taken after fasting for at least 8 hours.   BUN 6 6 - 20 mg/dL   Creatinine, Ser 0.77 0.44 - 1.00 mg/dL   Calcium 8.4 (L) 8.9 - 10.3 mg/dL   GFR, Estimated >60 >60 mL/min    Comment: (NOTE) Calculated using the CKD-EPI Creatinine Equation (2021)    Anion gap 6 5 - 15    Comment: Performed at [facility], [address] [place], [place] [address]  CBC with Differential/Platelet     Status: Abnormal   Collection Time: 01/07/21  6:59 AM  Result Value Ref Range   WBC 20.2 (H) 4.0 - 10.5 K/uL   RBC 3.47 (L) 3.87 - 5.11 MIL/uL   Hemoglobin 10.7 (L) 12.0 - 15.0 g/dL  HCT 31.8 (L) 36.0 - 46.0 %   MCV 91.6 80.0 - 100.0 fL   MCH 30.8 26.0 - 34.0 pg   MCHC 33.6 30.0 - 36.0 g/dL   RDW 14.3 11.5 - 15.5 %   Platelets 621 (H) 150 - 400 K/uL   nRBC 0.0 0.0 - 0.2 %   Neutrophils Relative % 68 %   Neutro Abs 13.6 (H) 1.7 - 7.7 K/uL   Lymphocytes Relative 20 %   Lymphs Abs 4.1 (H) 0.7 - 4.0 K/uL   Monocytes Relative 10 %   Monocytes Absolute 2.0 (H) 0.1 - 1.0 K/uL   Eosinophils Relative 1 %   Eosinophils Absolute 0.2 0.0 - 0.5 K/uL   Basophils Relative 0 %   Basophils Absolute 0.1 0.0 - 0.1 K/uL   Immature Granulocytes 1 %   Abs Immature Granulocytes 0.26 (H) 0.00 - 0.07 K/uL    Comment: Performed at [facility], [address] [place], [place] [address]  CBC with Differential/Platelet     Status: Abnormal   Collection Time: 01/07/21  2:05 PM  Result Value Ref Range   WBC 19.5 (H) 4.0 - 10.5 K/uL   RBC 3.26 (L) 3.87 - 5.11 MIL/uL   Hemoglobin 10.2 (L) 12.0 - 15.0 g/dL   HCT 29.8 (L) 36.0 - 46.0 %   MCV 91.4 80.0 - 100.0 fL   MCH 31.3 26.0 - 34.0 pg   MCHC 34.2 30.0 - 36.0 g/dL   RDW 14.3 11.5 - 15.5 %   Platelets 605 (H) 150 - 400 K/uL   nRBC 0.0 0.0 - 0.2 %   Neutrophils Relative % 67 %   Neutro Abs  13.3 (H) 1.7 - 7.7 K/uL   Lymphocytes Relative 21 %   Lymphs Abs 4.0 0.7 - 4.0 K/uL   Monocytes Relative 9 %   Monocytes Absolute 1.7 (H) 0.1 - 1.0 K/uL   Eosinophils Relative 1 %   Eosinophils Absolute 0.2 0.0 - 0.5 K/uL   Basophils Relative 0 %   Basophils Absolute 0.1 0.0 - 0.1 K/uL   Smear Review Normal platelet morphology     Comment: PLATELETS APPEAR INCREASED   Immature Granulocytes 2 %   Abs Immature Granulocytes 0.30 (H) 0.00 - 0.07 K/uL   Target Cells PRESENT     Comment: Performed at [facility], [address] [place], [place] [address]  Comprehensive metabolic panel     Status: Abnormal   Collection Time: 01/08/21  5:11 AM  Result Value Ref Range   Sodium 138 135 - 145 mmol/L   Potassium 4.1 3.5 - 5.1 mmol/L   Chloride 103 98 - 111 mmol/L   CO2 30 22 - 32 mmol/L   Glucose, Bld 105 (H) 70 - 99 mg/dL    Comment: Glucose reference range applies only to samples taken after fasting for at least 8 hours.   BUN 6 6 - 20 mg/dL   Creatinine, Ser 0.81 0.44 - 1.00 mg/dL   Calcium 8.4 (L) 8.9 - 10.3 mg/dL   Total Protein 5.8 (L) 6.5 - 8.1 g/dL   Albumin 3.0 (L) 3.5 - 5.0 g/dL   AST 14 (L) 15 - 41 U/L   ALT 24 0 - 44 U/L   Alkaline Phosphatase 49 38 - 126 U/L   Total Bilirubin 0.4 0.3 - 1.2 mg/dL   GFR, Estimated >60 >60 mL/min    Comment: (NOTE) Calculated using the CKD-EPI Creatinine Equation (2021)    Anion gap 5 5 -  15    Comment: Performed at [facility], [address] [place], [place] [address]  CBC with Differential/Platelet     Status: Abnormal   Collection Time: 01/08/21  5:11 AM  Result Value Ref Range   WBC 15.0 (H) 4.0 - 10.5 K/uL   RBC 3.45 (L) 3.87 - 5.11 MIL/uL   Hemoglobin 10.5 (L) 12.0 - 15.0 g/dL   HCT 31.3 (L) 36.0 - 46.0 %   MCV 90.7 80.0 - 100.0 fL   MCH 30.4 26.0 - 34.0 pg   MCHC 33.5 30.0 - 36.0 g/dL   RDW 14.2 11.5 - 15.5 %   Platelets 567 (H) 150 - 400 K/uL   nRBC 0.0 0.0 - 0.2 %   Neutrophils Relative % 58 %    Neutro Abs 8.9 (H) 1.7 - 7.7 K/uL   Lymphocytes Relative 28 %   Lymphs Abs 4.1 (H) 0.7 - 4.0 K/uL   Monocytes Relative 10 %   Monocytes Absolute 1.4 (H) 0.1 - 1.0 K/uL   Eosinophils Relative 2 %   Eosinophils Absolute 0.2 0.0 - 0.5 K/uL   Basophils Relative 1 %   Basophils Absolute 0.1 0.0 - 0.1 K/uL   WBC Morphology MORPHOLOGY UNREMARKABLE    RBC Morphology MORPHOLOGY UNREMARKABLE    Smear Review Normal platelet morphology    Immature Granulocytes 1 %   Abs Immature Granulocytes 0.20 (H) 0.00 - 0.07 K/uL    Comment: Performed at [facility], [address] [place], [place] [address]  Magnesium     Status: None   Collection Time: 01/08/21  5:11 AM  Result Value Ref Range   Magnesium 2.2 1.7 - 2.4 mg/dL    Comment: Performed at [facility], [address] [place], [place] [address]  CBC with Differential/Platelet     Status: Abnormal   Collection Time: 01/09/21  5:26 AM  Result Value Ref Range   WBC 14.0 (H) 4.0 - 10.5 K/uL   RBC 3.29 (L) 3.87 - 5.11 MIL/uL   Hemoglobin 10.1 (L) 12.0 - 15.0 g/dL   HCT 30.4 (L) 36.0 - 46.0 %   MCV 92.4 80.0 - 100.0 fL   MCH 30.7 26.0 - 34.0 pg   MCHC 33.2 30.0 - 36.0 g/dL   RDW 14.0 11.5 - 15.5 %   Platelets 585 (H) 150 - 400 K/uL   nRBC 0.0 0.0 - 0.2 %   Neutrophils Relative % 58 %   Neutro Abs 8.2 (H) 1.7 - 7.7 K/uL   Lymphocytes Relative 26 %   Lymphs Abs 3.7 0.7 - 4.0 K/uL   Monocytes Relative 11 %   Monocytes Absolute 1.5 (H) 0.1 - 1.0 K/uL   Eosinophils Relative 2 %   Eosinophils Absolute 0.2 0.0 - 0.5 K/uL   Basophils Relative 1 %   Basophils Absolute 0.1 0.0 - 0.1 K/uL   Immature Granulocytes 2 %   Abs Immature Granulocytes 0.25 (H) 0.00 - 0.07 K/uL    Comment: Performed at [facility], [address] [place], [place] [address]  Basic metabolic panel     Status: Abnormal   Collection Time: 01/09/21  5:26 AM  Result Value Ref Range   Sodium 137 135 - 145 mmol/L   Potassium 4.4 3.5 - 5.1 mmol/L    Chloride 104 98 - 111 mmol/L   CO2 29 22 - 32 mmol/L   Glucose, Bld 124 (H) 70 - 99 mg/dL    Comment: Glucose reference range applies only to  samples taken after fasting for at least 8 hours.   BUN 7 6 - 20 mg/dL   Creatinine, Ser 0.80 0.44 - 1.00 mg/dL   Calcium 8.5 (L) 8.9 - 10.3 mg/dL   GFR, Estimated >60 >60 mL/min    Comment: (NOTE) Calculated using the CKD-EPI Creatinine Equation (2021)    Anion gap 4 (L) 5 - 15    Comment: Performed at [facility], [address] [place], [place] [address]  SARS CORONAVIRUS 2 (TAT 6-24 HRS) Nasopharyngeal Nasopharyngeal Swab     Status: None   Collection Time: 01/17/21  9:07 AM   Specimen: Nasopharyngeal Swab  Result Value Ref Range   SARS Coronavirus 2 NEGATIVE NEGATIVE    Comment: (NOTE) SARS-CoV-2 target nucleic acids are NOT DETECTED.  The SARS-CoV-2 RNA is generally detectable in upper and lower respiratory specimens during the acute phase of infection. Negative results do not preclude SARS-CoV-2 infection, do not rule out co-infections with other pathogens, and should not be used as the sole basis for treatment or other patient management decisions. Negative results must be combined with clinical observations, patient history, and epidemiological information. The expected result is Negative.  Fact Sheet for Patients: SugarRoll.be  Fact Sheet for Healthcare Providers: https://www.woods-mathews.com/  This test is not yet approved or cleared by the [place] FDA and  has been authorized for detection and/or diagnosis of SARS-CoV-2 by FDA under an Emergency Use Authorization (EUA). This EUA will remain  in effect (meaning this test can be used) for the duration of the COVID-19 declaration under Se ction 564(b)(1) of the Act, 21 U.S.C. section 360bbb-3(b)(1), unless the authorization is terminated or revoked sooner.  Performed at [facility], [place] [address]  [place], [place] [address]    Objective  Body mass index is 33.9 kg/m. Wt Readings from Last 3 Encounters:  01/23/21 191 lb 6.4 oz (86.8 kg)  01/17/21 191 lb 6.4 oz (86.8 kg)  12/31/20 191 lb 12.8 oz (87 kg)   Temp Readings from Last 3 Encounters:  01/23/21 (!) 97.3 F (36.3 C) (Temporal)  01/17/21 (!) 97.1 F (36.2 C) (Oral)  01/09/21 98 F (36.7 C) (Oral)   BP Readings from Last 3 Encounters:  01/23/21 118/72  01/17/21 118/70  01/09/21 116/90   Pulse Readings from Last 3 Encounters:  01/23/21 90  01/17/21 82  01/09/21 74    Physical Exam Vitals and nursing note reviewed.  Constitutional:      Appearance: Normal appearance. She is well-developed and well-groomed. She is obese.  HENT:     Head: Normocephalic and atraumatic.  Eyes:     Conjunctiva/sclera: Conjunctivae normal.     Pupils: Pupils are equal, round, and reactive to light.  Cardiovascular:     Rate and Rhythm: Normal rate and regular rhythm.     Heart sounds: Normal heart sounds. No murmur heard. Pulmonary:     Effort: Pulmonary effort is normal.     Breath sounds: Normal breath sounds.  Abdominal:     General: Abdomen is flat. Bowel sounds are normal.     Tenderness: There is abdominal tenderness in the suprapubic area and left lower quadrant. There is no right CVA tenderness or left CVA tenderness.  Skin:    General: Skin is warm and dry.  Neurological:     General: No focal deficit present.     Mental Status: She is alert and oriented to person, place, and time. Mental status is at baseline.  Gait: Gait normal.  Psychiatric:        Attention and Perception: Attention and perception normal.        Mood and Affect: Mood and affect normal.        Speech: Speech normal.        Behavior: Behavior normal. Behavior is cooperative.        Thought Content: Thought content normal.        Cognition and Memory: Cognition normal.        Judgment: Judgment normal.    Assessment  Plan  Diverticulitis.  Recurrent completing 7 days cipro and flagyl post discharge  F/u GI needs to resch missed due to transportation issues wants female provider  Hypertension, unspecified type Hypertension associated with diabetes ([place]) On norvasc 10 mg qd controlled Obesity (BMI 30-39.9) Adipex not good due to HTN consider ozempic/wegovy disc can worsening GI sx's nausea/ab pain   Chronic abdominal pain F/u GI   Leukocytosis Asked Dr. Janese Banks if chronic ab pain could be related to elevated WBC I.e some sort of leukemia   HM Declines flu shot Tdap will do in future   Consider pna 23, shingrix and in future if has not had  covid had 3/3 rec 4th    S/p hysterectomy will ask at f/u if had abnormal pap ? If left ovary still intact right ovary appears out per imaging -established with [facility]    Dr. Jonathon Bellows colonoscopy had 07/11/19 and 07/29/19 and  EGD had 07/11/19 with path no malignancy concern  H/o sigmoid resection in past for h/o diverticulitis   07/29/19 colonoscopy neg bx f/u in 10 years    -mammo due 12/14/20   mammogram  12/15/19  IMPRESSION: 1.  No mammographic or ultrasound evidence for malignancy. 2. Possible focal area of fat necrosis in the 12:30 o'clock location of the RIGHT breast, warranting follow-up.   RECOMMENDATION: Recommend RIGHT breast ultrasound in 6 months to assess stability.   rec smoking cessation smoking < or = 0.5 ppd also using THC since age 56 y.o    -we discussed possible serious and likely etiologies, options for evaluation and workup, limitations of telemedicine visit vs in person visit, treatment, treatment risks and precautions    Provider: Dr. Olivia Mackie McLean-Scocuz[facility]

## 2021-01-25 ENCOUNTER — Telehealth: Payer: Self-pay | Admitting: Pulmonary Disease

## 2021-01-25 NOTE — Telephone Encounter (Signed)
Pt stated that she received a message via MyChart in regards to her getting her PFT results.  Pls regard; [phone]

## 2021-01-25 NOTE — Telephone Encounter (Signed)
Breathing test do not show much in the way of airway obstruction.  The test do show however that she would benefit from weight loss as this will allow forbetter lung volumes.  Patient is aware and voiced her understanding.  Nothing further needed.

## 2021-01-28 ENCOUNTER — Other Ambulatory Visit: Payer: Self-pay | Admitting: Internal Medicine

## 2021-01-28 DIAGNOSIS — J449 Chronic obstructive pulmonary disease, unspecified: Secondary | ICD-10-CM

## 2021-01-28 DIAGNOSIS — J4520 Mild intermittent asthma, uncomplicated: Secondary | ICD-10-CM

## 2021-01-28 MED ORDER — ALBUTEROL SULFATE HFA 108 (90 BASE) MCG/ACT IN AERS: 1.0000 | INHALATION_SPRAY | RESPIRATORY_TRACT | 3 refills | Status: DC | PRN

## 2021-01-30 ENCOUNTER — Telehealth: Payer: Self-pay

## 2021-01-30 DIAGNOSIS — E669 Obesity, unspecified: Secondary | ICD-10-CM

## 2021-01-30 DIAGNOSIS — E1159 Type 2 diabetes mellitus with other circulatory complications: Secondary | ICD-10-CM

## 2021-01-30 NOTE — Telephone Encounter (Signed)
Pt states that [facility] on [address] says that they did not receive rx for Semaglutide-Weight Management (WEGOVY) 1 MG/0.5ML SOAJ. Please resend

## 2021-01-31 ENCOUNTER — Encounter: Payer: Self-pay | Admitting: Internal Medicine

## 2021-01-31 ENCOUNTER — Ambulatory Visit (INDEPENDENT_AMBULATORY_CARE_PROVIDER_SITE_OTHER): Payer: Medicare Other | Admitting: Internal Medicine

## 2021-01-31 ENCOUNTER — Other Ambulatory Visit: Payer: Self-pay

## 2021-01-31 VITALS — BP 120/80 | HR 73 | Ht 63.0 in | Wt 189.0 lb

## 2021-01-31 DIAGNOSIS — I10 Essential (primary) hypertension: Secondary | ICD-10-CM

## 2021-01-31 DIAGNOSIS — I25118 Atherosclerotic heart disease of native coronary artery with other forms of angina pectoris: Secondary | ICD-10-CM | POA: Diagnosis not present

## 2021-01-31 DIAGNOSIS — E1169 Type 2 diabetes mellitus with other specified complication: Secondary | ICD-10-CM | POA: Diagnosis not present

## 2021-01-31 DIAGNOSIS — E785 Hyperlipidemia, unspecified: Secondary | ICD-10-CM | POA: Diagnosis not present

## 2021-01-31 MED ORDER — ISOSORBIDE MONONITRATE ER 60 MG PO TB24: 60.0000 mg | ORAL_TABLET | Freq: Two times a day (BID) | ORAL | 3 refills | Status: DC

## 2021-01-31 MED ORDER — ISOSORBIDE MONONITRATE ER 60 MG PO TB24: 60.0000 mg | ORAL_TABLET | Freq: Two times a day (BID) | ORAL | 1 refills | Status: DC

## 2021-01-31 MED ORDER — WEGOVY 1 MG/0.5ML ~~LOC~~ SOAJ: 1.0000 mg | SUBCUTANEOUS | 2 refills | Status: DC

## 2021-01-31 NOTE — Progress Notes (Signed)
Follow-up Outpatient Visit Date: 01/31/2021  Primary Care Provider: McLean-Scocuzza, Nino Glow, MD [place] [address]  Chief Complaint: Follow-up coronary artery disease  HPI:  Ms. Shawna Anderson is a 55 y.o. female with history of coronary artery disease status post PCI to the LCx, hypertension, hyperlipidemia, GERD, sleep apnea, asthma, tobacco use, and bipolar/anxiety disorder, who presents for follow-up of coronary artery disease.  She was last evaluated via virtual visit in early March after having undergone cardiac catheterization for evaluation of worsening chest pain.  Moderate multivessel CAD was noted without intervention.  LVEDP was moderately elevated, prompting addition of furosemide.  Procedure was complicated by difficulty achieving adequate sedation.  At the time of her follow-up, she noted that her chest pain was less frequent.  She was hospitalized last month with diverticulitis complicated by acute kidney injury.  Today, the patient reports that she still has intermittent chest pain.  She describes it as sharp and associated with belching.  Most often happens when she lies down though it can occur at other times as well.  It seems to resolve with nitroglycerin, though she took her last dose 2 months ago.  Usually, the chest pain only last 2 to 3 minutes.  She has stable exertional dyspnea.  She denies edema.  Her mobility is limited by plantar fasciitis.  --------------------------------------------------------------------------------------------------  Past Medical History:  Diagnosis Date   Anxiety    Asthma    Bipolar 1 disorder ([place])    Bipolar disorder ([place])    CAD (coronary artery disease)    s/p stent BMS OM Cx   Cervical herniated disc 04/12/2016   COPD (chronic obstructive pulmonary disease) (HCC)    Depression    Diabetes mellitus without complication (HCC)    Diverticulitis    GERD (gastroesophageal reflux disease)    Glaucoma    History  of blood transfusion    Hyperlipidemia    Hypertension    OSA (obstructive sleep apnea)    not using cpap    Plantar fasciitis    b/l feet s/p steroid shots w/o help and left surgery w/o help    UTI (urinary tract infection)    Past Surgical History:  Procedure Laterality Date   ABDOMINAL HYSTERECTOMY     ABDOMINAL SURGERY  1995   Bowel resection.   CARDIAC CATHETERIZATION N/A 04/14/2016   Procedure: Left Heart Cath and Coronary Angiography;  Surgeon: Burnell Blanks, MD;  Location: [facility];  Service: Cardiovascular;  Laterality: N/A;   CARDIAC SURGERY     COLONOSCOPY WITH PROPOFOL N/A 07/11/2019   Procedure: COLONOSCOPY WITH PROPOFOL;  Surgeon: Jonathon Bellows, MD;  Location: [facility] ENDOSCOPY;  Service: Gastroenterology;  Laterality: N/A;   COLONOSCOPY WITH PROPOFOL N/A 07/29/2019   Procedure: COLONOSCOPY WITH PROPOFOL;  Surgeon: Jonathon Bellows, MD;  Location: [facility] ENDOSCOPY;  Service: Gastroenterology;  Laterality: N/A;   CORONARY ANGIOPLASTY WITH STENT PLACEMENT  2010   Drug eluting stent   ESOPHAGOGASTRODUODENOSCOPY (EGD) WITH PROPOFOL N/A 07/11/2019   Procedure: ESOPHAGOGASTRODUODENOSCOPY (EGD) WITH PROPOFOL;  Surgeon: Jonathon Bellows, MD;  Location: [facility] ENDOSCOPY;  Service: Gastroenterology;  Laterality: N/A;   LEFT HEART CATH AND CORONARY ANGIOGRAPHY Left 09/11/2020   Procedure: LEFT HEART CATH AND CORONARY ANGIOGRAPHY;  Surgeon: Nelva Bush, MD;  Location: [facility];  Service: Cardiovascular;  Laterality: Left;   OVARIAN CYST REMOVAL      Current Meds  Medication Sig   albuterol (PROAIR HFA) 108 (90 Base) MCG/ACT inhaler Inhale 1-2 puffs into  the lungs every 4 (four) hours as needed for wheezing or shortness of breath.   amLODipine (NORVASC) 10 MG tablet Take 1 tablet (10 mg total) by mouth at bedtime.   aspirin 81 MG chewable tablet Chew 81 mg by mouth daily.   atorvastatin (LIPITOR) 80 MG tablet Take 1 tablet (80 mg total) by mouth daily at 6 PM.    Budeson-Glycopyrrol-Formoterol (BREZTRI AEROSPHERE) 160-9-4.8 MCG/ACT AERO Inhale 2 puffs into the lungs in the morning and at bedtime. Rinse   clopidogrel (PLAVIX) 75 MG tablet Take 1 tablet (75 mg total) by mouth daily.   divalproex (DEPAKOTE) 500 MG DR tablet TAKE 1 TABLET BY MOUTH  TWICE DAILY   ezetimibe (ZETIA) 10 MG tablet TAKE 1 TABLET BY MOUTH  DAILY WITH LIPITOR 80 MG AT NIGHT   icosapent Ethyl (VASCEPA) 1 g capsule Take 2 capsules (2 g total) by mouth 2 (two) times daily.   isosorbide mononitrate (IMDUR) 60 MG 24 hr tablet Take 1 tablet (60 mg total) by mouth daily.   methocarbamol (ROBAXIN) 500 MG tablet Take 1 tablet (500 mg total) by mouth 2 (two) times daily as needed for muscle spasms.   nitroGLYCERIN (NITROSTAT) 0.4 MG SL tablet DISSOLVE 1 TABLET UNDER  TONGUE EVERY 5 MINUTES AS  NEEDED FOR CHEST PAIN MAX  OF 3 TABLETS IN 15 MINUTES  . CALL 911 AFTER THIRD DOSE   pantoprazole (PROTONIX) 40 MG tablet TAKE 1 TABLET BY MOUTH  TWICE DAILY BEFORE MEALS   Semaglutide,0.25 or 0.5MG /DOS, (OZEMPIC, 0.25 OR 0.5 MG/DOSE,) 2 MG/1.5ML SOPN Inject 0.25-0.5 mg into the skin once a week. 0.25 weekly x 1 month week 1-4  increase to 0.5 weekly x 1 month week 5-8   traZODone (DESYREL) 150 MG tablet Take 1 tablet (150 mg total) by mouth at bedtime as needed for sleep.   Vitamin D, Cholecalciferol, 25 MCG (1000 UT) CAPS Take 1 capsule by mouth daily.    Allergies: Ativan [lorazepam], Augmentin [amoxicillin-pot clavulanate], Latex, Tape, Xifaxan [rifaximin], and Drixoral allergy sinus [dexbromphen-pse-apap er]  Social History   Tobacco Use   Smoking status: Former    Packs/day: 1.00    Years: 30.00    Pack years: 30.00    Types: Cigarettes    Quit date: 12/31/2020    Years since quitting: 0.0   Smokeless tobacco: Never   Tobacco comments:    Quit after being admitted on 6/20 01/17/2021  Vaping Use   Vaping Use: Never used  Substance Use Topics   Alcohol use: Not Currently    Comment: once a  year   Drug use: Yes    Frequency: 7.0 times per week    Types: Marijuana    Comment: 1 per day    Family History  Problem Relation Age of Onset   CAD Mother    Depression Mother    Heart disease Mother    Hyperlipidemia Mother    Hypertension Mother    Heart disease Father    Alcohol abuse Father    CAD Brother    Depression Brother    Diabetes Brother    Heart disease Brother    Hyperlipidemia Brother    Lupus Other    Sickle cell anemia Other     Review of Systems: A 12-system review of systems was performed and was negative except as noted in the HPI.  --------------------------------------------------------------------------------------------------  Physical Exam: BP 120/80 (BP Location: Left Arm, Patient Position: Sitting, Cuff Size: Large)   Pulse 73   Ht  5\' 3"  (1.6 m)   Wt 189 lb (85.7 kg)   SpO2 95%   BMI 33.48 kg/m   General:  NAD. Neck: No JVD or HJR. Lungs: Clear to auscultation bilaterally without wheezes or crackles. Heart: Regular rate and rhythm without murmurs, rubs, or gallops. Abdomen: Soft, nontender, nondistended. Extremities: No lower extremity edema.  EKG: Normal sinus rhythm with nonspecific T wave changes.  Lab Results  Component Value Date   WBC 14.0 (H) 01/09/2021   HGB 10.1 (L) 01/09/2021   HCT 30.4 (L) 01/09/2021   MCV 92.4 01/09/2021   PLT 585 (H) 01/09/2021    Lab Results  Component Value Date   NA 137 01/09/2021   K 4.4 01/09/2021   CL 104 01/09/2021   CO2 29 01/09/2021   BUN 7 01/09/2021   CREATININE 0.80 01/09/2021   GLUCOSE 124 (H) 01/09/2021   ALT 24 01/08/2021    Lab Results  Component Value Date   CHOL 158 12/31/2020   HDL 42 12/31/2020   LDLCALC 72 12/31/2020   LDLDIRECT 148.0 (H) 08/30/2020   TRIG 275 (H) 12/31/2020   CHOLHDL 3.8 12/31/2020   --------------------------------------------------------------------------------------------------  ASSESSMENT AND PLAN: Coronary artery disease with stable  angina: Patient continues to have waxing and waning chest pain that is largely atypical but responds to nitroglycerin.  We have reviewed her catheterization films from earlier this year, which showed moderate, nonobstructive CAD.  I favor continued medical therapy; we will increase isosorbide mononitrate to 60 mg twice daily, as well as amlodipine 10 mg daily.  It is reasonable to continue pantoprazole for component of GERD.  We will plan to continue DAPT indefinitely, as tolerated.  If she has refractory symptoms, I would favor repeating a pharmacologic MPI to assess for ischemia and reserve catheterization/PCI, as this would require general anesthesia.  Hypertension: Blood pressure reasonable today.  As above, we will escalate isosorbide mononitrate for antianginal therapy.  Hyperlipidemia associated with type 2 diabetes mellitus: LDL improved and almost at goal on last check a month ago.  Continue atorvastatin and ezetimibe as well as Vascepa.  Ongoing management of diabetes mellitus per PCP.  Follow-up: Return to clinic in 1 month.  Nelva Bush, MD 01/31/2021 11:22 AM

## 2021-01-31 NOTE — Patient Instructions (Signed)
Medication Instructions:   Your physician has recommended you make the following change in your medication:   INCREASE Imdur (Isosorbide) to 60mg  TWICE daily   *If you need a refill on your cardiac medications before your next appointment, please call your pharmacy*   Lab Work:  None ordered  Testing/Procedures:  None ordered   Follow-Up: At [facility], you and your health needs are our priority.  As part of our continuing mission to provide you with exceptional heart care, we have created designated Provider Care Teams.  These Care Teams include your primary Cardiologist (physician) and Advanced Practice Providers (APPs -  Physician Assistants and Nurse Practitioners) who all work together to provide you with the care you need, when you need it.  We recommend signing up for the patient portal called "MyChart".  Sign up information is provided on this After Visit Summary.  MyChart is used to connect with patients for Virtual Visits (Telemedicine).  Patients are able to view lab/test results, encounter notes, upcoming appointments, etc.  Non-urgent messages can be sent to your provider as well.   To learn more about what you can do with MyChart, go to [email or web address].    Your next appointment:   1 month(s)  The format for your next appointment:   In Person  Provider:   You may see Nelva Bush, MD or one of the following Advanced Practice Providers on your designated Care Team:   Murray Hodgkins, NP Christell Faith, PA-C Marrianne Mood, PA-C Cadence Ewen, [place]

## 2021-02-01 ENCOUNTER — Encounter: Payer: Self-pay | Admitting: Internal Medicine

## 2021-02-04 ENCOUNTER — Other Ambulatory Visit: Payer: Self-pay | Admitting: Internal Medicine

## 2021-02-04 ENCOUNTER — Telehealth: Payer: Self-pay | Admitting: Internal Medicine

## 2021-02-04 DIAGNOSIS — I10 Essential (primary) hypertension: Secondary | ICD-10-CM

## 2021-02-04 NOTE — Telephone Encounter (Signed)
Prior authorization has been submitted for patient's Wegovy 1 and 2.'4mg'$ .   Awaiting approval or denial.

## 2021-02-05 ENCOUNTER — Telehealth: Payer: Self-pay | Admitting: Internal Medicine

## 2021-02-05 NOTE — Telephone Encounter (Signed)
Please call Jasmine from [facility],  [phone]. It's about a referral that was sent to them form this office.

## 2021-02-07 ENCOUNTER — Other Ambulatory Visit: Payer: Self-pay | Admitting: Internal Medicine

## 2021-02-07 DIAGNOSIS — M62838 Other muscle spasm: Secondary | ICD-10-CM

## 2021-02-07 MED ORDER — METHOCARBAMOL 500 MG PO TABS: 500.0000 mg | ORAL_TABLET | Freq: Two times a day (BID) | ORAL | 1 refills | Status: DC | PRN

## 2021-02-08 ENCOUNTER — Telehealth: Payer: Self-pay | Admitting: Internal Medicine

## 2021-02-08 NOTE — Telephone Encounter (Signed)
I left a vm for Jasmine from [facility] to return my call. thanks

## 2021-02-10 ENCOUNTER — Emergency Department: Payer: Medicare Other

## 2021-02-10 ENCOUNTER — Telehealth: Payer: Self-pay | Admitting: Internal Medicine

## 2021-02-10 ENCOUNTER — Inpatient Hospital Stay
Admission: EM | Admit: 2021-02-10 | Discharge: 2021-02-12 | DRG: 872 | Disposition: A | Discharge status: Home / Self Care | Payer: Medicare Other | Attending: Internal Medicine | Admitting: Internal Medicine

## 2021-02-10 ENCOUNTER — Other Ambulatory Visit: Payer: Self-pay | Admitting: Internal Medicine

## 2021-02-10 ENCOUNTER — Other Ambulatory Visit: Payer: Self-pay

## 2021-02-10 DIAGNOSIS — Z20822 Contact with and (suspected) exposure to covid-19: Secondary | ICD-10-CM | POA: Diagnosis not present

## 2021-02-10 DIAGNOSIS — Z91048 Other nonmedicinal substance allergy status: Secondary | ICD-10-CM

## 2021-02-10 DIAGNOSIS — Z87891 Personal history of nicotine dependence: Secondary | ICD-10-CM

## 2021-02-10 DIAGNOSIS — K5792 Diverticulitis of intestine, part unspecified, without perforation or abscess without bleeding: Secondary | ICD-10-CM | POA: Diagnosis not present

## 2021-02-10 DIAGNOSIS — E785 Hyperlipidemia, unspecified: Secondary | ICD-10-CM | POA: Diagnosis present

## 2021-02-10 DIAGNOSIS — Z6833 Body mass index (BMI) 33.0-33.9, adult: Secondary | ICD-10-CM

## 2021-02-10 DIAGNOSIS — M5136 Other intervertebral disc degeneration, lumbar region: Secondary | ICD-10-CM | POA: Diagnosis not present

## 2021-02-10 DIAGNOSIS — Z9049 Acquired absence of other specified parts of digestive tract: Secondary | ICD-10-CM

## 2021-02-10 DIAGNOSIS — Z955 Presence of coronary angioplasty implant and graft: Secondary | ICD-10-CM

## 2021-02-10 DIAGNOSIS — G894 Chronic pain syndrome: Secondary | ICD-10-CM | POA: Diagnosis not present

## 2021-02-10 DIAGNOSIS — F419 Anxiety disorder, unspecified: Secondary | ICD-10-CM | POA: Diagnosis not present

## 2021-02-10 DIAGNOSIS — Z833 Family history of diabetes mellitus: Secondary | ICD-10-CM | POA: Diagnosis not present

## 2021-02-10 DIAGNOSIS — Z8249 Family history of ischemic heart disease and other diseases of the circulatory system: Secondary | ICD-10-CM

## 2021-02-10 DIAGNOSIS — Z888 Allergy status to other drugs, medicaments and biological substances status: Secondary | ICD-10-CM

## 2021-02-10 DIAGNOSIS — R112 Nausea with vomiting, unspecified: Secondary | ICD-10-CM | POA: Diagnosis not present

## 2021-02-10 DIAGNOSIS — G4733 Obstructive sleep apnea (adult) (pediatric): Secondary | ICD-10-CM | POA: Diagnosis not present

## 2021-02-10 DIAGNOSIS — I2510 Atherosclerotic heart disease of native coronary artery without angina pectoris: Secondary | ICD-10-CM | POA: Diagnosis not present

## 2021-02-10 DIAGNOSIS — I152 Hypertension secondary to endocrine disorders: Secondary | ICD-10-CM

## 2021-02-10 DIAGNOSIS — I499 Cardiac arrhythmia, unspecified: Secondary | ICD-10-CM | POA: Diagnosis not present

## 2021-02-10 DIAGNOSIS — F32A Depression, unspecified: Secondary | ICD-10-CM | POA: Diagnosis present

## 2021-02-10 DIAGNOSIS — H409 Unspecified glaucoma: Secondary | ICD-10-CM | POA: Diagnosis not present

## 2021-02-10 DIAGNOSIS — Z7951 Long term (current) use of inhaled steroids: Secondary | ICD-10-CM

## 2021-02-10 DIAGNOSIS — F319 Bipolar disorder, unspecified: Secondary | ICD-10-CM | POA: Diagnosis not present

## 2021-02-10 DIAGNOSIS — I10 Essential (primary) hypertension: Secondary | ICD-10-CM | POA: Diagnosis not present

## 2021-02-10 DIAGNOSIS — E1143 Type 2 diabetes mellitus with diabetic autonomic (poly)neuropathy: Secondary | ICD-10-CM | POA: Diagnosis present

## 2021-02-10 DIAGNOSIS — Z91040 Latex allergy status: Secondary | ICD-10-CM

## 2021-02-10 DIAGNOSIS — A419 Sepsis, unspecified organism: Secondary | ICD-10-CM | POA: Diagnosis not present

## 2021-02-10 DIAGNOSIS — Z7902 Long term (current) use of antithrombotics/antiplatelets: Secondary | ICD-10-CM

## 2021-02-10 DIAGNOSIS — K3184 Gastroparesis: Secondary | ICD-10-CM | POA: Diagnosis present

## 2021-02-10 DIAGNOSIS — Z90710 Acquired absence of both cervix and uterus: Secondary | ICD-10-CM

## 2021-02-10 DIAGNOSIS — K219 Gastro-esophageal reflux disease without esophagitis: Secondary | ICD-10-CM | POA: Diagnosis not present

## 2021-02-10 DIAGNOSIS — J449 Chronic obstructive pulmonary disease, unspecified: Secondary | ICD-10-CM | POA: Diagnosis not present

## 2021-02-10 DIAGNOSIS — Z83438 Family history of other disorder of lipoprotein metabolism and other lipidemia: Secondary | ICD-10-CM | POA: Diagnosis not present

## 2021-02-10 DIAGNOSIS — E876 Hypokalemia: Secondary | ICD-10-CM | POA: Diagnosis present

## 2021-02-10 DIAGNOSIS — Z0389 Encounter for observation for other suspected diseases and conditions ruled out: Secondary | ICD-10-CM | POA: Diagnosis not present

## 2021-02-10 DIAGNOSIS — K529 Noninfective gastroenteritis and colitis, unspecified: Secondary | ICD-10-CM | POA: Diagnosis not present

## 2021-02-10 DIAGNOSIS — Z7982 Long term (current) use of aspirin: Secondary | ICD-10-CM

## 2021-02-10 DIAGNOSIS — Z79891 Long term (current) use of opiate analgesic: Secondary | ICD-10-CM

## 2021-02-10 DIAGNOSIS — E1159 Type 2 diabetes mellitus with other circulatory complications: Secondary | ICD-10-CM

## 2021-02-10 DIAGNOSIS — R109 Unspecified abdominal pain: Secondary | ICD-10-CM | POA: Diagnosis not present

## 2021-02-10 DIAGNOSIS — Z9861 Coronary angioplasty status: Secondary | ICD-10-CM

## 2021-02-10 DIAGNOSIS — E669 Obesity, unspecified: Secondary | ICD-10-CM | POA: Diagnosis present

## 2021-02-10 DIAGNOSIS — R1110 Vomiting, unspecified: Secondary | ICD-10-CM | POA: Diagnosis not present

## 2021-02-10 DIAGNOSIS — Z743 Need for continuous supervision: Secondary | ICD-10-CM | POA: Diagnosis not present

## 2021-02-10 DIAGNOSIS — R110 Nausea: Secondary | ICD-10-CM | POA: Diagnosis not present

## 2021-02-10 DIAGNOSIS — Z79899 Other long term (current) drug therapy: Secondary | ICD-10-CM

## 2021-02-10 LAB — COMPREHENSIVE METABOLIC PANEL
ALT: 42 U/L (ref 0–44)
AST: 55 U/L — ABNORMAL HIGH (ref 15–41)
Albumin: 4 g/dL (ref 3.5–5.0)
Alkaline Phosphatase: 68 U/L (ref 38–126)
Anion gap: 14 (ref 5–15)
BUN: 17 mg/dL (ref 6–20)
CO2: 18 mmol/L — ABNORMAL LOW (ref 22–32)
Calcium: 8.5 mg/dL — ABNORMAL LOW (ref 8.9–10.3)
Chloride: 108 mmol/L (ref 98–111)
Creatinine, Ser: 0.87 mg/dL (ref 0.44–1.00)
GFR, Estimated: >60 mL/min (ref >60)
Glucose, Bld: 148 mg/dL — ABNORMAL HIGH (ref 70–99)
Potassium: 3.3 mmol/L — ABNORMAL LOW (ref 3.5–5.1)
Sodium: 140 mmol/L (ref 135–145)
Total Bilirubin: 1 mg/dL (ref 0.3–1.2)
Total Protein: 7 g/dL (ref 6.5–8.1)

## 2021-02-10 LAB — URINALYSIS, COMPLETE (UACMP) WITH MICROSCOPIC
Bacteria, UA: NONE SEEN
Bilirubin Urine: NEGATIVE
Glucose, UA: NEGATIVE mg/dL
Ketones, ur: 20 mg/dL — AB
Leukocytes,Ua: NEGATIVE
Nitrite: NEGATIVE
Protein, ur: 30 mg/dL — AB
Specific Gravity, Urine: >1.046 — ABNORMAL HIGH (ref 1.005–1.030)
pH: 6 (ref 5.0–8.0)

## 2021-02-10 LAB — CBC WITH DIFFERENTIAL/PLATELET
Abs Immature Granulocytes: 0.15 10*3/uL — ABNORMAL HIGH (ref 0.00–0.07)
Basophils Absolute: 0.1 10*3/uL (ref 0.0–0.1)
Basophils Relative: 0 %
Eosinophils Absolute: 0 10*3/uL (ref 0.0–0.5)
Eosinophils Relative: 0 %
HCT: 41 % (ref 36.0–46.0)
Hemoglobin: 14.1 g/dL (ref 12.0–15.0)
Immature Granulocytes: 1 %
Lymphocytes Relative: 9 %
Lymphs Abs: 2 10*3/uL (ref 0.7–4.0)
MCH: 30.8 pg (ref 26.0–34.0)
MCHC: 34.4 g/dL (ref 30.0–36.0)
MCV: 89.5 fL (ref 80.0–100.0)
Monocytes Absolute: 0.9 10*3/uL (ref 0.1–1.0)
Monocytes Relative: 4 %
Neutro Abs: 20.1 10*3/uL — ABNORMAL HIGH (ref 1.7–7.7)
Neutrophils Relative %: 86 %
Platelets: 650 10*3/uL — ABNORMAL HIGH (ref 150–400)
RBC: 4.58 MIL/uL (ref 3.87–5.11)
RDW: 13.2 % (ref 11.5–15.5)
WBC: 23.2 10*3/uL — ABNORMAL HIGH (ref 4.0–10.5)
nRBC: 0 % (ref 0.0–0.2)

## 2021-02-10 LAB — LACTIC ACID, PLASMA
Lactic Acid, Venous: 1.6 mmol/L (ref 0.5–1.9)
Lactic Acid, Venous: 6.6 mmol/L (ref 0.5–1.9)
Lactic Acid, Venous: 5.2 mmol/L (ref 0.5–1.9)

## 2021-02-10 LAB — MAGNESIUM: Magnesium: 2 mg/dL (ref 1.7–2.4)

## 2021-02-10 LAB — PROTIME-INR
INR: 1 (ref 0.8–1.2)
Prothrombin Time: 12.9 seconds (ref 11.4–15.2)

## 2021-02-10 LAB — LIPASE, BLOOD: Lipase: 26 U/L (ref 11–51)

## 2021-02-10 LAB — APTT: aPTT: 23 seconds — ABNORMAL LOW (ref 24–36)

## 2021-02-10 LAB — TROPONIN I (HIGH SENSITIVITY)
Troponin I (High Sensitivity): 22 ng/L — ABNORMAL HIGH (ref <18)
Troponin I (High Sensitivity): 24 ng/L — ABNORMAL HIGH (ref <18)

## 2021-02-10 MED ORDER — ICOSAPENT ETHYL 1 G PO CAPS
2.0000 g | ORAL_CAPSULE | Freq: Two times a day (BID) | ORAL | Status: DC
  Administered 2021-02-11 – 2021-02-12 (×2): 2 g via ORAL
  Filled 2021-02-10 (×4): qty 2

## 2021-02-10 MED ORDER — AMLODIPINE BESYLATE 10 MG PO TABS
10.0000 mg | ORAL_TABLET | Freq: Every day | ORAL | Status: DC
  Administered 2021-02-11: 10 mg via ORAL
  Filled 2021-02-10: qty 1
  Filled 2021-02-10: qty 2

## 2021-02-10 MED ORDER — LACTATED RINGERS IV SOLN: INTRAVENOUS | Status: AC

## 2021-02-10 MED ORDER — PANTOPRAZOLE SODIUM 40 MG PO TBEC
40.0000 mg | DELAYED_RELEASE_TABLET | Freq: Two times a day (BID) | ORAL | Status: DC
  Administered 2021-02-11 – 2021-02-12 (×3): 40 mg via ORAL
  Filled 2021-02-10 (×3): qty 1

## 2021-02-10 MED ORDER — DIPHENHYDRAMINE HCL 25 MG PO CAPS
50.0000 mg | ORAL_CAPSULE | Freq: Four times a day (QID) | ORAL | Status: DC | PRN
  Administered 2021-02-11: 50 mg via ORAL
  Filled 2021-02-10: qty 2

## 2021-02-10 MED ORDER — ALBUTEROL SULFATE HFA 108 (90 BASE) MCG/ACT IN AERS: 1.0000 | INHALATION_SPRAY | RESPIRATORY_TRACT | Status: DC | PRN

## 2021-02-10 MED ORDER — ISOSORBIDE MONONITRATE ER 30 MG PO TB24
60.0000 mg | ORAL_TABLET | Freq: Every day | ORAL | Status: DC
  Administered 2021-02-11 – 2021-02-12 (×2): 60 mg via ORAL
  Filled 2021-02-10: qty 2
  Filled 2021-02-10: qty 1

## 2021-02-10 MED ORDER — NITROGLYCERIN 0.4 MG SL SUBL
0.4000 mg | SUBLINGUAL_TABLET | SUBLINGUAL | Status: DC | PRN
Start: 2021-02-10 — End: 2021-02-12

## 2021-02-10 MED ORDER — MORPHINE SULFATE (PF) 4 MG/ML IV SOLN
4.0000 mg | Freq: Once | INTRAVENOUS | Status: AC
Start: 2021-02-10 — End: 2021-02-10
  Administered 2021-02-10: 4 mg via INTRAVENOUS
  Filled 2021-02-10: qty 1

## 2021-02-10 MED ORDER — LORATADINE 10 MG PO TABS: 10.0000 mg | ORAL_TABLET | Freq: Every day | ORAL | Status: DC | PRN

## 2021-02-10 MED ORDER — SODIUM CHLORIDE 0.9 % IV BOLUS
1000.0000 mL | Freq: Once | INTRAVENOUS | Status: AC
  Administered 2021-02-10: 1000 mL via INTRAVENOUS

## 2021-02-10 MED ORDER — LABETALOL HCL 5 MG/ML IV SOLN: 20.0000 mg | Freq: Once | INTRAVENOUS | Status: DC

## 2021-02-10 MED ORDER — ENOXAPARIN SODIUM 40 MG/0.4ML IJ SOSY
40.0000 mg | PREFILLED_SYRINGE | INTRAMUSCULAR | Status: DC
  Administered 2021-02-10 – 2021-02-11 (×2): 40 mg via SUBCUTANEOUS
  Filled 2021-02-10 (×2): qty 0.4

## 2021-02-10 MED ORDER — POTASSIUM CHLORIDE 10 MEQ/100ML IV SOLN
10.0000 meq | INTRAVENOUS | Status: AC
  Administered 2021-02-10 (×2): 10 meq via INTRAVENOUS
  Filled 2021-02-10 (×2): qty 100

## 2021-02-10 MED ORDER — SODIUM CHLORIDE 0.9 % IV SOLN
25.0000 mg | Freq: Once | INTRAVENOUS | Status: AC
  Administered 2021-02-10: 25 mg via INTRAVENOUS
  Filled 2021-02-10: qty 1

## 2021-02-10 MED ORDER — ATORVASTATIN CALCIUM 20 MG PO TABS
80.0000 mg | ORAL_TABLET | Freq: Every day | ORAL | Status: DC
  Administered 2021-02-11: 80 mg via ORAL
  Filled 2021-02-10: qty 4

## 2021-02-10 MED ORDER — SODIUM CHLORIDE 0.9% FLUSH
3.0000 mL | Freq: Two times a day (BID) | INTRAVENOUS | Status: DC
  Administered 2021-02-10 – 2021-02-12 (×4): 3 mL via INTRAVENOUS

## 2021-02-10 MED ORDER — HYDROMORPHONE HCL 1 MG/ML IJ SOLN
1.0000 mg | Freq: Once | INTRAMUSCULAR | Status: AC
  Administered 2021-02-10: 1 mg via INTRAVENOUS
  Filled 2021-02-10: qty 1

## 2021-02-10 MED ORDER — POLYETHYLENE GLYCOL 3350 17 G PO PACK: 17.0000 g | PACK | Freq: Every day | ORAL | Status: DC | PRN

## 2021-02-10 MED ORDER — LABETALOL HCL 5 MG/ML IV SOLN
10.0000 mg | Freq: Once | INTRAVENOUS | Status: DC
  Filled 2021-02-10: qty 4

## 2021-02-10 MED ORDER — BUDESON-GLYCOPYRROL-FORMOTEROL 160-9-4.8 MCG/ACT IN AERO: 2.0000 | INHALATION_SPRAY | Freq: Two times a day (BID) | RESPIRATORY_TRACT | Status: DC

## 2021-02-10 MED ORDER — ASPIRIN 81 MG PO CHEW
81.0000 mg | CHEWABLE_TABLET | Freq: Every day | ORAL | Status: DC
  Administered 2021-02-11 – 2021-02-12 (×2): 81 mg via ORAL
  Filled 2021-02-10 (×2): qty 1

## 2021-02-10 MED ORDER — PIPERACILLIN-TAZOBACTAM 3.375 G IVPB 30 MIN
3.3750 g | Freq: Once | INTRAVENOUS | Status: AC
  Administered 2021-02-10: 3.375 g via INTRAVENOUS
  Filled 2021-02-10: qty 50

## 2021-02-10 MED ORDER — EZETIMIBE 10 MG PO TABS
10.0000 mg | ORAL_TABLET | Freq: Every day | ORAL | Status: DC
  Administered 2021-02-12: 10 mg via ORAL
  Filled 2021-02-10: qty 1

## 2021-02-10 MED ORDER — ACETAMINOPHEN 325 MG PO TABS: 650.0000 mg | ORAL_TABLET | Freq: Four times a day (QID) | ORAL | Status: DC | PRN

## 2021-02-10 MED ORDER — HYDROMORPHONE HCL 1 MG/ML IJ SOLN
1.0000 mg | INTRAMUSCULAR | Status: AC | PRN
  Administered 2021-02-10 – 2021-02-11 (×3): 1 mg via INTRAVENOUS
  Filled 2021-02-10 (×3): qty 1

## 2021-02-10 MED ORDER — ACETAMINOPHEN 650 MG RE SUPP: 650.0000 mg | Freq: Four times a day (QID) | RECTAL | Status: DC | PRN

## 2021-02-10 MED ORDER — OLMESARTAN MEDOXOMIL-HCTZ 20-12.5 MG PO TABS: 0.5000 | ORAL_TABLET | Freq: Every day | ORAL | 3 refills | Status: DC

## 2021-02-10 MED ORDER — POTASSIUM CHLORIDE CRYS ER 20 MEQ PO TBCR
40.0000 meq | EXTENDED_RELEASE_TABLET | Freq: Once | ORAL | Status: AC
  Administered 2021-02-10: 40 meq via ORAL
  Filled 2021-02-10: qty 2

## 2021-02-10 MED ORDER — PIPERACILLIN-TAZOBACTAM 3.375 G IVPB
3.3750 g | Freq: Three times a day (TID) | INTRAVENOUS | Status: DC
  Administered 2021-02-11 – 2021-02-12 (×5): 3.375 g via INTRAVENOUS
  Filled 2021-02-10 (×5): qty 50

## 2021-02-10 MED ORDER — CLOPIDOGREL BISULFATE 75 MG PO TABS
75.0000 mg | ORAL_TABLET | Freq: Every day | ORAL | Status: DC
  Administered 2021-02-11 – 2021-02-12 (×2): 75 mg via ORAL
  Filled 2021-02-10 (×2): qty 1

## 2021-02-10 MED ORDER — IOHEXOL 350 MG/ML SOLN
100.0000 mL | Freq: Once | INTRAVENOUS | Status: AC | PRN
  Administered 2021-02-10: 100 mL via INTRAVENOUS

## 2021-02-10 MED ORDER — DIVALPROEX SODIUM 500 MG PO DR TAB
500.0000 mg | DELAYED_RELEASE_TABLET | Freq: Two times a day (BID) | ORAL | Status: DC
  Administered 2021-02-11 – 2021-02-12 (×4): 500 mg via ORAL
  Filled 2021-02-10 (×5): qty 1

## 2021-02-10 MED ORDER — METHOCARBAMOL 500 MG PO TABS
500.0000 mg | ORAL_TABLET | Freq: Two times a day (BID) | ORAL | Status: DC | PRN
  Administered 2021-02-12: 500 mg via ORAL
  Filled 2021-02-10 (×2): qty 1

## 2021-02-10 NOTE — ED Provider Notes (Signed)
[facility] Emergency Department Provider Note ____________________________________________   Event Date/Time   First MD Initiated Contact with Patient 02/10/21 1552     (approximate)  I have reviewed the triage vital signs and the nursing notes.   HISTORY  Chief Complaint Abdominal Pain and Nausea  HPI Shawna Anderson is a 55 y.o. female with history of Diverticulitis, COPD presents to the emergency department for treatment and evaluation of vomiting and severe abdominal pain that started this morning at 6am. She has been unable to tolerate any food or fluids. Pain is in the left lower quadrant and is similar to previous diverticulitis. No relief with zofran.         Past Medical History:  Diagnosis Date   Anxiety    Asthma    Bipolar 1 disorder ([place])    Bipolar disorder ([place])    CAD (coronary artery disease)    s/p stent BMS OM Cx   Cervical herniated disc 04/12/2016   COPD (chronic obstructive pulmonary disease) (HCC)    Depression    Diabetes mellitus without complication (HCC)    Diverticulitis    GERD (gastroesophageal reflux disease)    Glaucoma    History of blood transfusion    Hyperlipidemia    Hypertension    OSA (obstructive sleep apnea)    not using cpap    Plantar fasciitis    b/l feet s/p steroid shots w/o help and left surgery w/o help    UTI (urinary tract infection)     Patient Active Problem List   Diagnosis Date Noted   Hypertension associated with diabetes ([place]) 01/23/2021   Obesity (BMI 30-39.9) 01/23/2021   Gastroparesis    Diverticulitis 01/01/2021   AKI (acute kidney injury) ([place]) 12/31/2020   Hypokalemia 12/31/2020   Transaminitis 12/31/2020   Acute diverticulitis 12/17/2020   Chronic pain syndrome 12/05/2020   Acute bronchitis with COPD ([place]) 11/28/2020   Cough 11/28/2020   Accelerating angina (HCC) 09/11/2020   Chronic nausea 03/26/2020   SUI (stress urinary incontinence, female) 03/26/2020    Bronchitis 03/26/2020   Continuous LLQ abdominal pain 03/01/2020   URI with cough and congestion 03/01/2020   Esophageal dysphagia 01/26/2020   Schatzki's ring of distal esophagus 01/26/2020   Ecchymosis 01/11/2020   Chronic pain of both knees 01/11/2020   Bruising 12/05/2019   Trichomonas infection 12/05/2019   Elevated liver enzymes 11/15/2019   Prediabetes 11/15/2019   STD exposure 11/15/2019   Anemia 11/15/2019   Chronic bursitis of right shoulder 10/25/2019   Chest wall pain 09/05/2019   Poor dentition 08/18/2019   Thrombocytosis 08/18/2019   Leukocytosis 08/18/2019   Anal lesion 08/18/2019   Female pelvic pain 05/26/2019   Colitis 05/26/2019   HLD (hyperlipidemia) 12/27/2018   Vitamin D deficiency 12/24/2018   Bipolar disorder ([place]) 12/30/2017   OSA (obstructive sleep apnea) 12/30/2017   Constipation 12/30/2017   Coronary artery disease of native artery of native heart with stable angina pectoris ([place]) 12/30/2017   Gastroesophageal reflux disease 12/30/2017   Asthma 12/29/2017   COPD (chronic obstructive pulmonary disease) ([place]) 12/29/2017   Anxiety and depression 12/29/2017   Insomnia 12/29/2017   Chronic back pain 12/29/2017   Skin lesion of back 12/29/2017   CAD S/P CFX PCI 2010 04/15/2016   Essential hypertension 04/15/2016   Hyperlipidemia associated with type 2 diabetes mellitus ([place]) 04/15/2016   Noncompliance with medication regimen 04/15/2016   Chest pain with moderate risk of acute coronary syndrome 04/12/2016   Chronic  abdominal pain 01/12/2015   Vomiting 01/12/2015   Nausea and vomiting 01/12/2015    Past Surgical History:  Procedure Laterality Date   ABDOMINAL HYSTERECTOMY     ABDOMINAL SURGERY  1995   Bowel resection.   CARDIAC CATHETERIZATION N/A 04/14/2016   Procedure: Left Heart Cath and Coronary Angiography;  Surgeon: Burnell Blanks, MD;  Location: [facility];  Service: Cardiovascular;  Laterality: N/A;   CARDIAC SURGERY      COLONOSCOPY WITH PROPOFOL N/A 07/11/2019   Procedure: COLONOSCOPY WITH PROPOFOL;  Surgeon: Jonathon Bellows, MD;  Location: [facility] ENDOSCOPY;  Service: Gastroenterology;  Laterality: N/A;   COLONOSCOPY WITH PROPOFOL N/A 07/29/2019   Procedure: COLONOSCOPY WITH PROPOFOL;  Surgeon: Jonathon Bellows, MD;  Location: [facility] ENDOSCOPY;  Service: Gastroenterology;  Laterality: N/A;   CORONARY ANGIOPLASTY WITH STENT PLACEMENT  2010   Drug eluting stent   ESOPHAGOGASTRODUODENOSCOPY (EGD) WITH PROPOFOL N/A 07/11/2019   Procedure: ESOPHAGOGASTRODUODENOSCOPY (EGD) WITH PROPOFOL;  Surgeon: Jonathon Bellows, MD;  Location: [facility] ENDOSCOPY;  Service: Gastroenterology;  Laterality: N/A;   LEFT HEART CATH AND CORONARY ANGIOGRAPHY Left 09/11/2020   Procedure: LEFT HEART CATH AND CORONARY ANGIOGRAPHY;  Surgeon: Nelva Bush, MD;  Location: [facility];  Service: Cardiovascular;  Laterality: Left;   OVARIAN CYST REMOVAL      Prior to Admission medications   Medication Sig Start Date End Date Taking? Authorizing Provider  albuterol (PROAIR HFA) 108 (90 Base) MCG/ACT inhaler Inhale 1-2 puffs into the lungs every 4 (four) hours as needed for wheezing or shortness of breath. 01/28/21   McLean-Scocuzza, Nino Glow, MD  amLODipine (NORVASC) 10 MG tablet Take 1 tablet (10 mg total) by mouth at bedtime. 04/16/20   McLean-Scocuzza, Nino Glow, MD  aspirin 81 MG chewable tablet Chew 81 mg by mouth daily.    [provider]  atorvastatin (LIPITOR) 80 MG tablet Take 1 tablet (80 mg total) by mouth daily at 6 PM. 04/16/20   McLean-Scocuzza, Nino Glow, MD  Budeson-Glycopyrrol-Formoterol (BREZTRI AEROSPHERE) 160-9-4.8 MCG/ACT AERO Inhale 2 puffs into the lungs in the morning and at bedtime. Rinse 12/05/20   McLean-Scocuzza, Nino Glow, MD  clopidogrel (PLAVIX) 75 MG tablet Take 1 tablet (75 mg total) by mouth daily. 04/16/20   McLean-Scocuzza, Nino Glow, MD  divalproex (DEPAKOTE) 500 MG DR tablet TAKE 1 TABLET BY MOUTH  TWICE DAILY 08/16/20    McLean-Scocuzza, Nino Glow, MD  ezetimibe (ZETIA) 10 MG tablet TAKE 1 TABLET BY MOUTH  DAILY WITH LIPITOR 80 MG AT NIGHT 11/26/20   McLean-Scocuzza, Nino Glow, MD  icosapent Ethyl (VASCEPA) 1 g capsule Take 2 capsules (2 g total) by mouth 2 (two) times daily. 01/01/21   Pavero, Harrell Gave, RPH  isosorbide mononitrate (IMDUR) 60 MG 24 hr tablet Take 1 tablet (60 mg total) by mouth in the morning and at bedtime. 01/31/21 01/26/22  End, Harrell Gave, MD  levocetirizine (XYZAL) 5 MG tablet Take 5 mg by mouth daily as needed. 10/25/20   [provider]  methocarbamol (ROBAXIN) 500 MG tablet Take 1 tablet (500 mg total) by mouth 2 (two) times daily as needed for muscle spasms. 02/07/21   McLean-Scocuzza, Nino Glow, MD  nitroGLYCERIN (NITROSTAT) 0.4 MG SL tablet DISSOLVE 1 TABLET UNDER  TONGUE EVERY 5 MINUTES AS  NEEDED FOR CHEST PAIN MAX  OF 3 TABLETS IN 15 MINUTES  . CALL 911 AFTER THIRD DOSE 11/26/20   Loel Dubonnet, NP  pantoprazole (PROTONIX) 40 MG tablet TAKE 1 TABLET BY MOUTH  TWICE DAILY  BEFORE MEALS 01/31/20   Jonathon Bellows, MD  Semaglutide,0.25 or 0.'5MG'$ /DOS, (OZEMPIC, 0.25 OR 0.5 MG/DOSE,) 2 MG/1.5ML SOPN Inject 0.25-0.5 mg into the skin once a week. 0.25 weekly x 1 month week 1-4  increase to 0.5 weekly x 1 month week 5-8 01/23/21   McLean-Scocuzza, Nino Glow, MD  Semaglutide-Weight Management [facility]) 1 MG/0.5ML SOAJ Inject 1 mg into the skin once a week. Week 9-12 Patient not taking: Reported on 01/31/2021 01/31/21   McLean-Scocuzza, Nino Glow, MD  Semaglutide-Weight Management (WEGOVY) 1.7 MG/0.75ML SOAJ Inject 1.7 mg into the skin once a week. Weekly 13-16 Patient not taking: Reported on 01/31/2021 01/23/21   McLean-Scocuzza, Nino Glow, MD  Semaglutide-Weight Management (WEGOVY) 2.4 MG/0.75ML SOAJ Inject 2.4 mg into the skin once a week. Week 17 and beyond Patient not taking: Reported on 01/31/2021 01/23/21   McLean-Scocuzza, Nino Glow, MD  traZODone (DESYREL) 150 MG tablet Take 1 tablet (150 mg total) by mouth  at bedtime as needed for sleep. 04/16/20   McLean-Scocuzza, Nino Glow, MD  Vitamin D, Cholecalciferol, 25 MCG (1000 UT) CAPS Take 1 capsule by mouth daily.    [provider]    Allergies Ativan [lorazepam], Augmentin [amoxicillin-pot clavulanate], Latex, Tape, Xifaxan [rifaximin], and Drixoral allergy sinus [dexbromphen-pse-apap er]  Family History  Problem Relation Age of Onset   CAD Mother    Depression Mother    Heart disease Mother    Hyperlipidemia Mother    Hypertension Mother    Heart disease Father    Alcohol abuse Father    CAD Brother    Depression Brother    Diabetes Brother    Heart disease Brother    Hyperlipidemia Brother    Lupus Other    Sickle cell anemia Other     Social History Social History   Tobacco Use   Smoking status: Former    Packs/day: 1.00    Years: 30.00    Pack years: 30.00    Types: Cigarettes    Quit date: 12/31/2020    Years since quitting: 0.1   Smokeless tobacco: Never   Tobacco comments:    Quit after being admitted on 6/20 01/17/2021  Vaping Use   Vaping Use: Never used  Substance Use Topics   Alcohol use: Yes    Comment: once a year   Drug use: Yes    Frequency: 7.0 times per week    Types: Marijuana    Comment: 1 per day    Review of Systems  Constitutional: No fever/chills Eyes: No visual changes. ENT: No sore throat. Cardiovascular: Denies chest pain. Respiratory: Denies shortness of breath. Gastrointestinal: Positive for nausea and vomiting. Denies diarrhea. Genitourinary: Negative for dysuria. Musculoskeletal: Negative for back pain. Skin: Negative for rash. Neurological: Positive for headaches, negative for focal weakness or numbness. ____________________________________________   PHYSICAL EXAM:  VITAL SIGNS: ED Triage Vitals  Enc Vitals Group     BP 02/10/21 1609 (S) (!) 226/139     Pulse Rate 02/10/21 1609 (!) 117     Resp 02/10/21 1609 (!) 26     Temp 02/10/21 1609 97.9 F (36.6 C)     Temp  Source 02/10/21 1609 Oral     SpO2 02/10/21 1609 100 %     Weight 02/10/21 1558 190 lb (86.2 kg)     Height 02/10/21 1558 '5\' 3"'$  (1.6 m)     Head Circumference --      Peak Flow --      Pain Score --  Pain Loc --      Pain Edu? --      Excl. in [place]? --     Constitutional: Alert and oriented. Well appearing and in no acute distress. Eyes: Conjunctivae are normal. PERRL. EOMI. Head: Atraumatic. Nose: No congestion/rhinnorhea. Mouth/Throat: Mucous membranes are moist.  Oropharynx non-erythematous. Neck: No stridor.   Hematological/Lymphatic/Immunilogical: No cervical lymphadenopathy. Cardiovascular: Normal rate, regular rhythm. Grossly normal heart sounds.  Good peripheral circulation. Respiratory: Normal respiratory effort.  No retractions. Lungs CTAB. Gastrointestinal: Soft, tender in the left lower quadrant and mid abdomen. No bruit on exam. Genitourinary:  Musculoskeletal: No lower extremity tenderness nor edema.  No joint effusions. Neurologic:  Normal speech and language. No gross focal neurologic deficits are appreciated. No gait instability. Skin:  Skin is warm, dry and intact. No rash noted. Psychiatric: Mood and affect are normal. Speech and behavior are normal.  ____________________________________________   LABS (all labs ordered are listed, but only abnormal results are displayed)  Labs Reviewed  LACTIC ACID, PLASMA - Abnormal; Notable for the following components:      Result Value   Lactic Acid, Venous 6.6 (*)    All other components within normal limits  CBC WITH DIFFERENTIAL/PLATELET - Abnormal; Notable for the following components:   WBC 23.2 (*)    Platelets 650 (*)    Neutro Abs 20.1 (*)    Abs Immature Granulocytes 0.15 (*)    All other components within normal limits  URINALYSIS, COMPLETE (UACMP) WITH MICROSCOPIC - Abnormal; Notable for the following components:   Color, Urine YELLOW (*)    APPearance CLEAR (*)    Specific Gravity, Urine >1.046 (*)     Hgb urine dipstick MODERATE (*)    Ketones, ur 20 (*)    Protein, ur 30 (*)    All other components within normal limits  APTT - Abnormal; Notable for the following components:   aPTT 23 (*)    All other components within normal limits  COMPREHENSIVE METABOLIC PANEL - Abnormal; Notable for the following components:   Potassium 3.3 (*)    CO2 18 (*)    Glucose, Bld 148 (*)    Calcium 8.5 (*)    AST 55 (*)    All other components within normal limits  LACTIC ACID, PLASMA - Abnormal; Notable for the following components:   Lactic Acid, Venous 5.2 (*)    All other components within normal limits  TROPONIN I (HIGH SENSITIVITY) - Abnormal; Notable for the following components:   Troponin I (High Sensitivity) 22 (*)    All other components within normal limits  TROPONIN I (HIGH SENSITIVITY) - Abnormal; Notable for the following components:   Troponin I (High Sensitivity) 24 (*)    All other components within normal limits  CULTURE, BLOOD (SINGLE)  URINE CULTURE  RESP PANEL BY RT-PCR (FLU A&B, COVID) ARPGX2  PROTIME-INR  LIPASE, BLOOD   ____________________________________________  EKG  ED ECG REPORT I, Kallen Delatorre, FNP-BC personally viewed and interpreted this ECG.   Date: 02/10/2021  EKG Time: 1605  Rate: 119  Rhythm: sinus tachycardia  Axis: normal  Intervals:none  ST&T Change: no ST elevation  ____________________________________________  RADIOLOGY  ED MD interpretation:    Chest x-ray without acute concerns.  CT abdomen and pelvis without contrast normal  CTA abdomen and pelvis with contrast shows colitis v/s diverticulitis.  I, Sherrie George, personally viewed and evaluated these images (plain radiographs) as part of my medical decision making, as well as reviewing the written report  by the radiologist.  Official radiology report(s): CT ABDOMEN PELVIS WO CONTRAST  Result Date: 02/10/2021 CLINICAL DATA:  Abdominal pain. Nausea and vomiting. EXAM: CT  ABDOMEN AND PELVIS WITHOUT CONTRAST TECHNIQUE: Multidetector CT imaging of the abdomen and pelvis was performed following the standard protocol without IV contrast. COMPARISON:  01/06/2021 FINDINGS: Lower chest: No acute findings. Hepatobiliary: No mass visualized on this unenhanced exam. Gallbladder is unremarkable. No evidence of biliary ductal dilatation. Pancreas: No mass or inflammatory process visualized on this unenhanced exam. Spleen:  Within normal limits in size. Adrenals/Urinary tract: No evidence of urolithiasis or hydronephrosis. Urinary bladder is empty. Stomach/Bowel: No evidence of obstruction, inflammatory process, or abnormal fluid collections. Surgical anastomosis again seen in the sigmoid colon. Vascular/Lymphatic: No pathologically enlarged lymph nodes identified. No evidence of abdominal aortic aneurysm. Aortic atherosclerotic calcification noted. Reproductive: Prior hysterectomy noted. Adnexal regions are unremarkable in appearance. Other:  None. Musculoskeletal:  No suspicious bone lesions identified. IMPRESSION: No acute findings or other significant abnormality identified. Aortic Atherosclerosis (ICD10-I70.0). Electronically Signed   By: Marlaine Hind M.D.   On: 02/10/2021 17:48   DG Chest Port 1 View  Result Date: 02/10/2021 CLINICAL DATA:  Questionable sepsis.  Evaluate for abnormality. EXAM: PORTABLE CHEST 1 VIEW COMPARISON:  May 22, 2020 FINDINGS: The heart size and mediastinal contours are within normal limits. Both lungs are clear. The visualized skeletal structures are unremarkable. IMPRESSION: No active disease. Electronically Signed   By: Dorise Bullion III M.D   On: 02/10/2021 17:59   CT Angio Abd/Pel W and/or Wo Contrast  Result Date: 02/10/2021 CLINICAL DATA:  Left upper quadrant abdominal pain.  Vomiting. EXAM: CTA ABDOMEN AND PELVIS WITHOUT AND WITH CONTRAST TECHNIQUE: Multidetector CT imaging of the abdomen and pelvis was performed using the standard protocol  during bolus administration of intravenous contrast. Multiplanar reconstructed images and MIPs were obtained and reviewed to evaluate the vascular anatomy. CONTRAST:  185m OMNIPAQUE IOHEXOL 350 MG/ML SOLN COMPARISON:  CT abdomen pelvis earlier same day. FINDINGS: VASCULAR Aorta: Normal caliber abdominal aorta. Peripheral calcified atherosclerotic plaque. Celiac: Patent.  No evidence for aneurysm or dissection. SMA: Unremarkable Renals: Mild atherosclerotic narrowing at the origin of the right left renal arteries. IMA: Patent Inflow: Patent without evidence of aneurysm, dissection, vasculitis or significant stenosis. Proximal Outflow: Bilateral common femoral and visualized portions of the superficial and profunda femoral arteries are patent without evidence of aneurysm, dissection, vasculitis or significant stenosis. Veins: No obvious venous abnormality within the limitations of this arterial phase study. Review of the MIP images confirms the above findings. NON-VASCULAR Lower chest: Normal heart size.  Lung bases are clear. Hepatobiliary: Liver is normal in size and contour. No focal lesion identified. Gallbladder is unremarkable. No intrahepatic or extrahepatic biliary ductal dilatation. Pancreas: Unremarkable Spleen: Unremarkable Adrenals/Urinary Tract: Normal adrenal glands. Kidneys enhance symmetrically with contrast. No hydronephrosis. Urinary bladder is unremarkable. Stomach/Bowel: Postsurgical changes involving the sigmoid colon. There is mild circumferential wall thickening of the descending colon. No evidence for free fluid or free intraperitoneal air. No evidence for bowel obstruction. Normal morphology of the stomach. Lymphatic: Normal caliber abdominal aorta. Peripheral calcified atherosclerotic plaque. No retroperitoneal lymphadenopathy. Reproductive: Prior hysterectomy. Other: None. Musculoskeletal: No aggressive or acute appearing osseous lesions. Lower thoracic lumbar spine degenerative changes.  IMPRESSION: VASCULAR No acute vascular process. NON-VASCULAR There is mild circumferential wall thickening of the descending colon which may be secondary to colitis or diverticulitis. Otherwise no acute process within the abdomen or pelvis. Electronically Signed   By: DDian Situ  Rosana Hoes M.D.   On: 02/10/2021 19:20    ____________________________________________   PROCEDURES  Procedure(s) performed (including Critical Care):  Procedures  ____________________________________________   INITIAL IMPRESSION / ASSESSMENT AND PLAN     55 year old female presenting to the ER for abdominal pain and vomiting. See HPI.  DIFFERENTIAL DIAGNOSIS  Diverticulitis, bowel ischemia, bowel obstrustion/necrosis, gastroenteritis.  ED COURSE   Clinical Course as of 02/10/21 1952  Nancy Fetter Feb 10, 2021  1645 IV inserted by me. Right AC. Labs drawn and sent by RN. [CT]  [phone] Patient continues to complain of pain and reports that "morphine never works. They always have to give me Dilaudid." She remains tachycardic and hypertensive. Dilaudid ordered. IV dislodged. 20 gauge inserted in left forearm by me. IV fluids now infusing. Leukocytosis present on CBC. Awaiting CMP in hopes to get her to CT soon. [CT]  1731 Sepsis orders in progress for lactic of 6.6.  CT abdomen and pelvis without contrast due to history of AKI with similar symptoms. CMP hemolyzed according to lab. Will rescan if necessary once CMP is back if labs are ok. [CT]  1816 Reassessed. Heart rate and BP coming down without intervention. Lab contacted regarding CMP results. Verbal report BUN of 17 and Creat <1. Will get CTA Abdomen and pelvis with contrast. CT tech contacted and advised of normal renal function. [CT]  1947 Potassium(!): 3.3 [CT]  1949 Lactic acid trending down. Patient sitting quietly in bed. No further vomiting. Will admit for antibiotic therapy for diverticulitis and to trend lactic.  [CT]    Clinical Course User Index [CT] Graciano Batson,  Kenrick Pore B, FNP    CRITICAL CARE Performed by: Sherrie George   Total critical care time: 60 minutes  Critical care time was exclusive of separately billable procedures and treating other patients.  Critical care was necessary to treat or prevent imminent or life-threatening deterioration.  Critical care was time spent personally by me on the following activities: development of treatment plan with patient and/or surrogate as well as nursing, discussions with consultants, evaluation of patient's response to treatment, examination of patient, obtaining history from patient or surrogate, ordering and performing treatments and interventions, ordering and review of laboratory studies, ordering and review of radiographic studies, pulse oximetry and re-evaluation of patient's condition. ___________________________________________   FINAL CLINICAL IMPRESSION(S) / ED DIAGNOSES  Final diagnoses:  Diverticulitis  Sepsis without acute organ dysfunction, due to unspecified organism [facility])     ED Discharge Orders     None        Shawna Anderson was evaluated in Emergency Department on 02/10/2021 for the symptoms described in the history of present illness. She was evaluated in the context of the global COVID-19 pandemic, which necessitated consideration that the patient might be at risk for infection with the SARS-CoV-2 virus that causes COVID-19. Institutional protocols and algorithms that pertain to the evaluation of patients at risk for COVID-19 are in a state of rapid change based on information released by regulatory bodies including the CDC and federal and state organizations. These policies and algorithms were followed during the patient's care in the ED.   Note:  This document was prepared using Dragon voice recognition software and may include unintentional dictation errors.    Victorino Dike, FNP 02/10/21 [place]    Lavonia Drafts, MD 02/10/21 2007

## 2021-02-10 NOTE — ED Notes (Signed)
This RN administering ordered morphine at this time. Pt states, "that shit don't work. They usually give me Fentanyl first then Dilaudid when it ain't doin' nothing." NP Cari notified.

## 2021-02-10 NOTE — ED Notes (Signed)
Cari, NP notified of critical lactic acid 6.6.

## 2021-02-10 NOTE — Telephone Encounter (Signed)
Also inform with chronic abdominal pain do not rec GLP med ie ozempic/wegovy for weight loss unless GI approves has appt late 02/2021 dr. Marius Ditch   Rec healthy diet and exercise

## 2021-02-10 NOTE — H&P (Signed)
History and Physical   Shawna Anderson [id number] DOB: 27-Apr-1966 DOA: 02/10/2021  PCP: McLean-Scocuzza, Nino Glow, MD   Patient coming from: Home  Chief Complaint: Abdominal pain, nausea, vomiting  HPI: Shawna Anderson is a 55 y.o. female with medical history significant of CAD status post PCI, COPD, diverticulosis, anemia, anxiety, depression, bipolar, chronic pain, hypertension, GERD, OSA, dysphagia, Schatzki's ring, stress urinary incontinence who presents with ongoing abdominal pain nausea and vomiting. As above patient has had ongoing abdominal pain with nausea and vomiting for the past couple of days.  She states that she has had recurrent episodes of colitis and diverticulitis with intermittent nausea and abdominal pain.  She states that for the past couple days the pain has been increased to about a 10 out of 10 last night and it was diffuse across both lower quadrants with a sharp/digging sensation.  She states that she felt her abdomen was more distended and had some vomiting and was unable to sleep.  Pain is now more localized in her left lower quadrant and has improved with medication in the ED. She denies fevers, chills, chest pain, shortness of breath.  ED Course: Vital signs in the ED significant for initial heart rate in the 110s with improvement to the 90s initial respiratory rate in the 20s with improvement to the teens, blood pressure initially in the [id number] systolic now in the [id number] systolic.  Lab work-up showed CMP with potassium 3.3, bicarb 18, glucose 148, calcium stable at 8.5, mild AST elevation at 55.  CBC showed significant leukocytosis to 23 with platelets of 650.  PT, PT OT, INR were normal, mildly elevated 23, normal respectively.  Troponin mildly elevated but flat at 22 and then 24 and repeat.  Lactic acid initially elevated to 6.6 which improved to 5.2 with fluids.  Lipase level normal.  Respiratory panel flu COVID pending.  Urinalysis, urine culture, blood cultures  pending.  Chest x-ray showed no acute abnormality.  CT abdomen pelvis showed no acute abnormality.  CTA abdomen pelvis showed no acute vascular abnormality but did show thickening of the descending colon wall suspicious for colitis versus diverticulitis.  Patient given a dose of Zosyn in the ED as well as 2 L of IV fluid.  She also received doses of Phenergan, Dilaudid, morphine, labetalol.  Review of Systems: As per HPI otherwise all other systems reviewed and are negative.  Past Medical History:  Diagnosis Date   Anxiety    Asthma    Bipolar 1 disorder ([place])    Bipolar disorder ([place])    CAD (coronary artery disease)    s/p stent BMS OM Cx   Cervical herniated disc 04/12/2016   COPD (chronic obstructive pulmonary disease) (HCC)    Depression    Diabetes mellitus without complication (HCC)    Diverticulitis    GERD (gastroesophageal reflux disease)    Glaucoma    History of blood transfusion    Hyperlipidemia    Hypertension    OSA (obstructive sleep apnea)    not using cpap    Plantar fasciitis    b/l feet s/p steroid shots w/o help and left surgery w/o help    UTI (urinary tract infection)     Past Surgical History:  Procedure Laterality Date   ABDOMINAL HYSTERECTOMY     ABDOMINAL SURGERY  1995   Bowel resection.   CARDIAC CATHETERIZATION N/A 04/14/2016   Procedure: Left Heart Cath and Coronary Angiography;  Surgeon: Burnell Blanks, MD;  Location: [facility]  CV LAB;  Service: Cardiovascular;  Laterality: N/A;   CARDIAC SURGERY     COLONOSCOPY WITH PROPOFOL N/A 07/11/2019   Procedure: COLONOSCOPY WITH PROPOFOL;  Surgeon: Jonathon Bellows, MD;  Location: [facility] ENDOSCOPY;  Service: Gastroenterology;  Laterality: N/A;   COLONOSCOPY WITH PROPOFOL N/A 07/29/2019   Procedure: COLONOSCOPY WITH PROPOFOL;  Surgeon: Jonathon Bellows, MD;  Location: [facility] ENDOSCOPY;  Service: Gastroenterology;  Laterality: N/A;   CORONARY ANGIOPLASTY WITH STENT PLACEMENT  2010   Drug eluting stent    ESOPHAGOGASTRODUODENOSCOPY (EGD) WITH PROPOFOL N/A 07/11/2019   Procedure: ESOPHAGOGASTRODUODENOSCOPY (EGD) WITH PROPOFOL;  Surgeon: Jonathon Bellows, MD;  Location: [facility] ENDOSCOPY;  Service: Gastroenterology;  Laterality: N/A;   LEFT HEART CATH AND CORONARY ANGIOGRAPHY Left 09/11/2020   Procedure: LEFT HEART CATH AND CORONARY ANGIOGRAPHY;  Surgeon: Nelva Bush, MD;  Location: [facility] CV LAB;  Service: Cardiovascular;  Laterality: Left;   OVARIAN CYST REMOVAL      Social History  reports that she quit smoking about 5 weeks ago. Her smoking use included cigarettes. She has a 30.00 pack-year smoking history. She has never used smokeless tobacco. She reports current alcohol use. She reports current drug use. Frequency: 7.00 times per week. Drug: Marijuana.  Allergies  Allergen Reactions   Ativan [Lorazepam]     Pt states it makes her tongue do weird things    Augmentin [Amoxicillin-Pot Clavulanate]     Upset stomach    Latex Other (See Comments)    Patient stated that she was told by her doctor that she is "allergic to" this   Tape Other (See Comments)    Patient stated that she was told by her doctor that she is "allergic to" this   Xifaxan [Rifaximin]     Pt believes this is contributing to her abdominal pain/discomfort   Drixoral Allergy Sinus [Dexbromphen-Pse-Apap Er] Rash    Family History  Problem Relation Age of Onset   CAD Mother    Depression Mother    Heart disease Mother    Hyperlipidemia Mother    Hypertension Mother    Heart disease Father    Alcohol abuse Father    CAD Brother    Depression Brother    Diabetes Brother    Heart disease Brother    Hyperlipidemia Brother    Lupus Other    Sickle cell anemia Other   Reviewed on admission  Prior to Admission medications   Medication Sig Start Date End Date Taking? Authorizing Provider  albuterol (PROAIR HFA) 108 (90 Base) MCG/ACT inhaler Inhale 1-2 puffs into the lungs every 4 (four) hours as needed for  wheezing or shortness of breath. 01/28/21   McLean-Scocuzza, Nino Glow, MD  amLODipine (NORVASC) 10 MG tablet Take 1 tablet (10 mg total) by mouth at bedtime. 04/16/20   McLean-Scocuzza, Nino Glow, MD  aspirin 81 MG chewable tablet Chew 81 mg by mouth daily.    [provider]  atorvastatin (LIPITOR) 80 MG tablet Take 1 tablet (80 mg total) by mouth daily at 6 PM. 04/16/20   McLean-Scocuzza, Nino Glow, MD  Budeson-Glycopyrrol-Formoterol (BREZTRI AEROSPHERE) 160-9-4.8 MCG/ACT AERO Inhale 2 puffs into the lungs in the morning and at bedtime. Rinse 12/05/20   McLean-Scocuzza, Nino Glow, MD  clopidogrel (PLAVIX) 75 MG tablet Take 1 tablet (75 mg total) by mouth daily. 04/16/20   McLean-Scocuzza, Nino Glow, MD  divalproex (DEPAKOTE) 500 MG DR tablet TAKE 1 TABLET BY MOUTH  TWICE DAILY 08/16/20   McLean-Scocuzza, Nino Glow, MD  ezetimibe (ZETIA) 10 MG  tablet TAKE 1 TABLET BY MOUTH  DAILY WITH LIPITOR 80 MG AT NIGHT 11/26/20   McLean-Scocuzza, Nino Glow, MD  icosapent Ethyl (VASCEPA) 1 g capsule Take 2 capsules (2 g total) by mouth 2 (two) times daily. 01/01/21   Pavero, Harrell Gave, RPH  isosorbide mononitrate (IMDUR) 60 MG 24 hr tablet Take 1 tablet (60 mg total) by mouth in the morning and at bedtime. 01/31/21 01/26/22  End, Harrell Gave, MD  levocetirizine (XYZAL) 5 MG tablet Take 5 mg by mouth daily as needed. 10/25/20   [provider]  methocarbamol (ROBAXIN) 500 MG tablet Take 1 tablet (500 mg total) by mouth 2 (two) times daily as needed for muscle spasms. 02/07/21   McLean-Scocuzza, Nino Glow, MD  nitroGLYCERIN (NITROSTAT) 0.4 MG SL tablet DISSOLVE 1 TABLET UNDER  TONGUE EVERY 5 MINUTES AS  NEEDED FOR CHEST PAIN MAX  OF 3 TABLETS IN 15 MINUTES  . CALL 911 AFTER THIRD DOSE 11/26/20   Loel Dubonnet, NP  pantoprazole (PROTONIX) 40 MG tablet TAKE 1 TABLET BY MOUTH  TWICE DAILY BEFORE MEALS 01/31/20   Jonathon Bellows, MD  Semaglutide,0.25 or 0.'5MG'$ /DOS, (OZEMPIC, 0.25 OR 0.5 MG/DOSE,) 2 MG/1.5ML SOPN Inject 0.25-0.5 mg  into the skin once a week. 0.25 weekly x 1 month week 1-4  increase to 0.5 weekly x 1 month week 5-8 01/23/21   McLean-Scocuzza, Nino Glow, MD  Semaglutide-Weight Management (WEGOVY) 2.4 MG/0.75ML SOAJ Inject 2.4 mg into the skin once a week. Week 17 and beyond Patient not taking: Reported on 01/31/2021 01/23/21   McLean-Scocuzza, Nino Glow, MD  traZODone (DESYREL) 150 MG tablet Take 1 tablet (150 mg total) by mouth at bedtime as needed for sleep. 04/16/20   McLean-Scocuzza, Nino Glow, MD  Vitamin D, Cholecalciferol, 25 MCG (1000 UT) CAPS Take 1 capsule by mouth daily.    [provider]    Physical Exam: Vitals:   02/10/21 1753 02/10/21 1755 02/10/21 1805 02/10/21 2003  BP: (!) 207/162 (!) 214/161 (!) 141/81 (!) 156/91  Pulse: (!) 110 (!) 114 98 (!) 106  Resp: '13 17 19   '$ Temp:      TempSrc:      SpO2: 98% 100% 96% 98%  Weight:      Height:       Physical Exam Constitutional:      General: She is not in acute distress.    Appearance: Normal appearance. She is obese.  HENT:     Head: Normocephalic and atraumatic.     Mouth/Throat:     Mouth: Mucous membranes are moist.     Pharynx: Oropharynx is clear.  Eyes:     Extraocular Movements: Extraocular movements intact.     Pupils: Pupils are equal, round, and reactive to light.  Cardiovascular:     Rate and Rhythm: Normal rate and regular rhythm.     Pulses: Normal pulses.     Heart sounds: Normal heart sounds.  Pulmonary:     Effort: Pulmonary effort is normal. No respiratory distress.     Breath sounds: Normal breath sounds.  Abdominal:     General: Bowel sounds are normal. There is no distension.     Palpations: Abdomen is soft.     Tenderness: There is abdominal tenderness in the right lower quadrant and left lower quadrant.  Musculoskeletal:        General: No swelling or deformity.  Skin:    General: Skin is warm and dry.  Neurological:     General: No focal deficit  present.     Mental Status: Mental status is at  baseline.   Labs on Admission: I have personally reviewed following labs and imaging studies  CBC: Recent Labs  Lab 02/10/21 1639  WBC 23.2*  NEUTROABS 20.1*  HGB 14.1  HCT 41.0  MCV 89.5  PLT 650*    Basic Metabolic Panel: Recent Labs  Lab 02/10/21 1717  NA 140  K 3.3*  CL 108  CO2 18*  GLUCOSE 148*  BUN 17  CREATININE 0.87  CALCIUM 8.5*    GFR: Estimated Creatinine Clearance: 77.8 mL/min (by C-G formula based on SCr of 0.87 mg/dL).  Liver Function Tests: Recent Labs  Lab 02/10/21 1717  AST 55*  ALT 42  ALKPHOS 68  BILITOT 1.0  PROT 7.0  ALBUMIN 4.0    Urine analysis:    Component Value Date/Time   COLORURINE YELLOW (A) 02/10/2021 1551   APPEARANCEUR CLEAR (A) 02/10/2021 1551   APPEARANCEUR Turbid (A) 12/31/2017 0912   LABSPEC >1.046 (H) 02/10/2021 1551   LABSPEC 1.028 10/26/2014 1717   PHURINE 6.0 02/10/2021 1551   GLUCOSEU NEGATIVE 02/10/2021 1551   GLUCOSEU Negative 10/26/2014 1717   HGBUR MODERATE (A) 02/10/2021 1551   BILIRUBINUR NEGATIVE 02/10/2021 1551   BILIRUBINUR Negative 12/31/2017 0912   BILIRUBINUR Negative 10/26/2014 1717   KETONESUR 20 (A) 02/10/2021 1551   PROTEINUR 30 (A) 02/10/2021 1551   NITRITE NEGATIVE 02/10/2021 1551   LEUKOCYTESUR NEGATIVE 02/10/2021 1551   LEUKOCYTESUR Negative 10/26/2014 1717    Radiological Exams on Admission: CT ABDOMEN PELVIS WO CONTRAST  Result Date: 02/10/2021 CLINICAL DATA:  Abdominal pain. Nausea and vomiting. EXAM: CT ABDOMEN AND PELVIS WITHOUT CONTRAST TECHNIQUE: Multidetector CT imaging of the abdomen and pelvis was performed following the standard protocol without IV contrast. COMPARISON:  01/06/2021 FINDINGS: Lower chest: No acute findings. Hepatobiliary: No mass visualized on this unenhanced exam. Gallbladder is unremarkable. No evidence of biliary ductal dilatation. Pancreas: No mass or inflammatory process visualized on this unenhanced exam. Spleen:  Within normal limits in size.  Adrenals/Urinary tract: No evidence of urolithiasis or hydronephrosis. Urinary bladder is empty. Stomach/Bowel: No evidence of obstruction, inflammatory process, or abnormal fluid collections. Surgical anastomosis again seen in the sigmoid colon. Vascular/Lymphatic: No pathologically enlarged lymph nodes identified. No evidence of abdominal aortic aneurysm. Aortic atherosclerotic calcification noted. Reproductive: Prior hysterectomy noted. Adnexal regions are unremarkable in appearance. Other:  None. Musculoskeletal:  No suspicious bone lesions identified. IMPRESSION: No acute findings or other significant abnormality identified. Aortic Atherosclerosis (ICD10-I70.0). Electronically Signed   By: Marlaine Hind M.D.   On: 02/10/2021 17:48   DG Chest Port 1 View  Result Date: 02/10/2021 CLINICAL DATA:  Questionable sepsis.  Evaluate for abnormality. EXAM: PORTABLE CHEST 1 VIEW COMPARISON:  May 22, 2020 FINDINGS: The heart size and mediastinal contours are within normal limits. Both lungs are clear. The visualized skeletal structures are unremarkable. IMPRESSION: No active disease. Electronically Signed   By: Dorise Bullion III M.D   On: 02/10/2021 17:59   CT Angio Abd/Pel W and/or Wo Contrast  Result Date: 02/10/2021 CLINICAL DATA:  Left upper quadrant abdominal pain.  Vomiting. EXAM: CTA ABDOMEN AND PELVIS WITHOUT AND WITH CONTRAST TECHNIQUE: Multidetector CT imaging of the abdomen and pelvis was performed using the standard protocol during bolus administration of intravenous contrast. Multiplanar reconstructed images and MIPs were obtained and reviewed to evaluate the vascular anatomy. CONTRAST:  168m OMNIPAQUE IOHEXOL 350 MG/ML SOLN COMPARISON:  CT abdomen pelvis earlier same day. FINDINGS: VASCULAR Aorta: Normal  caliber abdominal aorta. Peripheral calcified atherosclerotic plaque. Celiac: Patent.  No evidence for aneurysm or dissection. SMA: Unremarkable Renals: Mild atherosclerotic narrowing at the  origin of the right left renal arteries. IMA: Patent Inflow: Patent without evidence of aneurysm, dissection, vasculitis or significant stenosis. Proximal Outflow: Bilateral common femoral and visualized portions of the superficial and profunda femoral arteries are patent without evidence of aneurysm, dissection, vasculitis or significant stenosis. Veins: No obvious venous abnormality within the limitations of this arterial phase study. Review of the MIP images confirms the above findings. NON-VASCULAR Lower chest: Normal heart size.  Lung bases are clear. Hepatobiliary: Liver is normal in size and contour. No focal lesion identified. Gallbladder is unremarkable. No intrahepatic or extrahepatic biliary ductal dilatation. Pancreas: Unremarkable Spleen: Unremarkable Adrenals/Urinary Tract: Normal adrenal glands. Kidneys enhance symmetrically with contrast. No hydronephrosis. Urinary bladder is unremarkable. Stomach/Bowel: Postsurgical changes involving the sigmoid colon. There is mild circumferential wall thickening of the descending colon. No evidence for free fluid or free intraperitoneal air. No evidence for bowel obstruction. Normal morphology of the stomach. Lymphatic: Normal caliber abdominal aorta. Peripheral calcified atherosclerotic plaque. No retroperitoneal lymphadenopathy. Reproductive: Prior hysterectomy. Other: None. Musculoskeletal: No aggressive or acute appearing osseous lesions. Lower thoracic lumbar spine degenerative changes. IMPRESSION: VASCULAR No acute vascular process. NON-VASCULAR There is mild circumferential wall thickening of the descending colon which may be secondary to colitis or diverticulitis. Otherwise no acute process within the abdomen or pelvis. Electronically Signed   By: Lovey Newcomer M.D.   On: 02/10/2021 19:20    EKG: Independently reviewed.  Sinus tachycardia at 119 bpm.  Evidence of left atrial enlargement  Assessment/Plan Principal Problem:   Sepsis ([place]) Active  Problems:   CAD S/P CFX PCI 2010   Essential hypertension   COPD (chronic obstructive pulmonary disease) (HCC)   Bipolar disorder (HCC)   Anxiety and depression   OSA (obstructive sleep apnea)   Gastroesophageal reflux disease   HLD (hyperlipidemia)   Chronic pain syndrome   Acute diverticulitis   Hypokalemia  Sepsis Diverticulitis vs Colitis > Patient presenting with tachycardia, tachypnea, elevated white count of 23, initial lactic acid of 6.6 consistent with sepsis.  Evidence of colonic wall thickening consistent with diverticulitis versus colitis.  Favor diverticulitis given her history of this. > No other evidence of infection on chest x-ray nor CT scan with urine and blood cultures pending. > Started on Zosyn in the ED will continue with this for now.  Lactic acid started to downtrend initially 6.6 as above now 5.2.  Blood pressure is stable and actually was initially hypertensive in the ED.  CTA of abdomen pelvis done to rule out mesenteric ischemia. - Monitor on progressive unit - Continue with IV fluids and trend lactic acid - Trend fever curve and white count - Follow-up urinalysis, urine culture, blood cultures - Bowel rest, ice chips/sips with meds only  Hypokalemia > Potassium noted to be 3.3 in the ED. - 2 mEq of IV potassium is unclear how well she will tolerate p.o. - We will also try 40 mEq p.o. potassium - Check magnesium - Trend levels.  CAD > Status post PCI > Cath earlier this year showed moderate nonobstructive disease - Continue home aspirin, Plavix - Continue home Imdur, Zetia, atorvastatin, Vascepa  COPD - Continue home Breztri and as needed albuterol  Anxiety Depression Bipolar - Continue home Depakote  Hypertension > BP actually initially elevated despite evidence of sepsis as above in ED.  Improved with a dose of labetalol. -  Continue home amlodipine  GERD - Continue home PPI  DVT prophylaxis: Lovenox  Code Status:   Full  Family  Communication:  None on admission Disposition Plan:   Patient is from:  Home  Anticipated DC to:  Home  Anticipated DC date:  1 to 3 days  Anticipated DC barriers: None  Consults called:  None  Admission status:  observation, progressive   Severity of Illness: The appropriate patient status for this patient is OBSERVATION. Observation status is judged to be reasonable and necessary in order to provide the required intensity of service to ensure the patient's safety. The patient's presenting symptoms, physical exam findings, and initial radiographic and laboratory data in the context of their medical condition is felt to place them at decreased risk for further clinical deterioration. Furthermore, it is anticipated that the patient will be medically stable for discharge from the hospital within 2 midnights of admission. The following factors support the patient status of observation.   " The patient's presenting symptoms include abdominal pain, nausea, vomiting. " The physical exam findings include abdominal pain. " The initial radiographic and laboratory data are Lab work-up showed CMP with potassium 3.3, bicarb 18, glucose 148, calcium stable at 8.5, mild AST elevation at 55.  CBC showed significant leukocytosis to 23 with platelets of 650.  PT, PT OT, INR were normal, mildly elevated 23, normal respectively.  Troponin mildly elevated but flat at 22 and then 24 and repeat.  Lactic acid initially elevated to 6.6 which improved to 5.2 with fluids.  Lipase level normal.  Respiratory panel flu COVID pending.  Urinalysis, urine culture, blood cultures pending.  Chest x-ray showed no acute abnormality.  CT abdomen pelvis showed no acute abnormality.  CTA abdomen pelvis showed no acute vascular abnormality but did show thickening of the descending colon wall suspicious for colitis versus diverticulitis.   Marcelyn Bruins MD [facility]  How to contact the [facility] Attending or Consulting provider  [place] or covering provider during after hours [place], for this patient?   Check the care team in [facility] and look for a) attending/consulting [facility] provider listed and b) the [facility] team listed Log into [email or web address] and use [place]'s universal password to access. If you do not have the password, please contact the hospital operator. Locate the [facility] provider you are looking for under [facility] and page to a number that you can be directly reached. If you still have difficulty reaching the provider, please page the [facility] (Director on Call) for the Hospitalists listed on amion for assistance.  02/10/2021, 8:25 PM

## 2021-02-10 NOTE — ED Notes (Signed)
Pt transported to CT via stretcher at this time.  

## 2021-02-10 NOTE — ED Notes (Signed)
Hospitalist at the bedside 

## 2021-02-10 NOTE — ED Notes (Signed)
Pt sitting up in bed, texting. No S/S of distress noted.

## 2021-02-10 NOTE — ED Notes (Signed)
Ice chips provided per MD ok 

## 2021-02-10 NOTE — Telephone Encounter (Signed)
Shawna Anderson, What is this rx refill request? Was it sent in?  Benicar hctz 40-25 mg qd was d/c 12/19/20 hospital discharge as BP was running low normal on imdur and norvasc 10 BP now is rising will resume benicar inform pt but at a lower dose   Sent to [facility] monitor BP goal <130/<80 if BP <90/<60 too low   Dr. Olivia Mackie McLean-Scocuzza

## 2021-02-10 NOTE — ED Notes (Signed)
Pt states she went to the [facility] and forgot urine sample was needed. Reminded to collect urine sample when she uses [facility] again.

## 2021-02-10 NOTE — ED Notes (Signed)
Portable Xray at bedside.

## 2021-02-10 NOTE — Consult Note (Signed)
Pharmacy Antibiotic Note  Shawna Anderson is a 55 y.o. female admitted on 02/10/2021 with diverticulitis vs colitis. Pharmacy has been consulted for zosyn dosing.  Plan: Zosyn 3.375 g IV  q8h (4-hour infusion) Monitor clinical picture and  renal function F/U C&S, abx deescalation / LOT  Height: '5\' 3"'$  (160 cm) Weight: 86.2 kg (190 lb) IBW/kg (Calculated) : 52.4  Temp (24hrs), Avg:97.9 F (36.6 C), Min:97.9 F (36.6 C), Max:97.9 F (36.6 C)  Recent Labs  Lab 02/10/21 1639 02/10/21 1717 02/10/21 1805  WBC 23.2*  --   --   CREATININE  --  0.87  --   LATICACIDVEN 6.6*  --  5.2*    Estimated Creatinine Clearance: 77.8 mL/min (by C-G formula based on SCr of 0.87 mg/dL).    Allergies  Allergen Reactions   Ativan [Lorazepam]     Pt states it makes her tongue do weird things    Augmentin [Amoxicillin-Pot Clavulanate]     Upset stomach    Latex Other (See Comments)    Patient stated that she was told by her doctor that she is "allergic to" this   Tape Other (See Comments)    Patient stated that she was told by her doctor that she is "allergic to" this   Xifaxan [Rifaximin]     Pt believes this is contributing to her abdominal pain/discomfort   Drixoral Allergy Sinus [Dexbromphen-Pse-Apap Er] Rash    Antimicrobials this admission: 7/31 Zosyn >>   Dose adjustments this admission:  Microbiology results: 7/31 BCx: pending 7/31 UCx: pending   Thank you for allowing pharmacy to be a part of this patient's care.  Darnelle Bos, PharmD 02/10/2021 8:13 PM

## 2021-02-10 NOTE — ED Triage Notes (Signed)
Pt BIB EMS from home C/O abdominal pain, nausea, vomiting. Pt actively vomiting in triage.

## 2021-02-11 ENCOUNTER — Telehealth: Payer: Self-pay | Admitting: Internal Medicine

## 2021-02-11 DIAGNOSIS — K5792 Diverticulitis of intestine, part unspecified, without perforation or abscess without bleeding: Secondary | ICD-10-CM | POA: Diagnosis not present

## 2021-02-11 DIAGNOSIS — F319 Bipolar disorder, unspecified: Secondary | ICD-10-CM | POA: Diagnosis not present

## 2021-02-11 DIAGNOSIS — Z20822 Contact with and (suspected) exposure to covid-19: Secondary | ICD-10-CM | POA: Diagnosis present

## 2021-02-11 DIAGNOSIS — G4733 Obstructive sleep apnea (adult) (pediatric): Secondary | ICD-10-CM | POA: Diagnosis present

## 2021-02-11 DIAGNOSIS — K3184 Gastroparesis: Secondary | ICD-10-CM | POA: Diagnosis present

## 2021-02-11 DIAGNOSIS — Z6833 Body mass index (BMI) 33.0-33.9, adult: Secondary | ICD-10-CM | POA: Diagnosis not present

## 2021-02-11 DIAGNOSIS — H409 Unspecified glaucoma: Secondary | ICD-10-CM | POA: Diagnosis present

## 2021-02-11 DIAGNOSIS — Z9049 Acquired absence of other specified parts of digestive tract: Secondary | ICD-10-CM | POA: Diagnosis not present

## 2021-02-11 DIAGNOSIS — Z833 Family history of diabetes mellitus: Secondary | ICD-10-CM | POA: Diagnosis not present

## 2021-02-11 DIAGNOSIS — I10 Essential (primary) hypertension: Secondary | ICD-10-CM | POA: Diagnosis present

## 2021-02-11 DIAGNOSIS — E1143 Type 2 diabetes mellitus with diabetic autonomic (poly)neuropathy: Secondary | ICD-10-CM | POA: Diagnosis present

## 2021-02-11 DIAGNOSIS — Z955 Presence of coronary angioplasty implant and graft: Secondary | ICD-10-CM | POA: Diagnosis not present

## 2021-02-11 DIAGNOSIS — I2510 Atherosclerotic heart disease of native coronary artery without angina pectoris: Secondary | ICD-10-CM | POA: Diagnosis not present

## 2021-02-11 DIAGNOSIS — Z90710 Acquired absence of both cervix and uterus: Secondary | ICD-10-CM | POA: Diagnosis not present

## 2021-02-11 DIAGNOSIS — Z83438 Family history of other disorder of lipoprotein metabolism and other lipidemia: Secondary | ICD-10-CM | POA: Diagnosis not present

## 2021-02-11 DIAGNOSIS — J449 Chronic obstructive pulmonary disease, unspecified: Secondary | ICD-10-CM | POA: Diagnosis present

## 2021-02-11 DIAGNOSIS — Z8249 Family history of ischemic heart disease and other diseases of the circulatory system: Secondary | ICD-10-CM | POA: Diagnosis not present

## 2021-02-11 DIAGNOSIS — E669 Obesity, unspecified: Secondary | ICD-10-CM | POA: Diagnosis present

## 2021-02-11 DIAGNOSIS — K219 Gastro-esophageal reflux disease without esophagitis: Secondary | ICD-10-CM | POA: Diagnosis present

## 2021-02-11 DIAGNOSIS — E785 Hyperlipidemia, unspecified: Secondary | ICD-10-CM | POA: Diagnosis present

## 2021-02-11 DIAGNOSIS — F419 Anxiety disorder, unspecified: Secondary | ICD-10-CM | POA: Diagnosis present

## 2021-02-11 DIAGNOSIS — A419 Sepsis, unspecified organism: Secondary | ICD-10-CM | POA: Diagnosis present

## 2021-02-11 DIAGNOSIS — G894 Chronic pain syndrome: Secondary | ICD-10-CM | POA: Diagnosis present

## 2021-02-11 DIAGNOSIS — E876 Hypokalemia: Secondary | ICD-10-CM | POA: Diagnosis present

## 2021-02-11 LAB — CBC
HCT: 32.5 % — ABNORMAL LOW (ref 36.0–46.0)
Hemoglobin: 10.9 g/dL — ABNORMAL LOW (ref 12.0–15.0)
MCH: 30.6 pg (ref 26.0–34.0)
MCHC: 33.5 g/dL (ref 30.0–36.0)
MCV: 91.3 fL (ref 80.0–100.0)
Platelets: 426 10*3/uL — ABNORMAL HIGH (ref 150–400)
RBC: 3.56 MIL/uL — ABNORMAL LOW (ref 3.87–5.11)
RDW: 13.5 % (ref 11.5–15.5)
WBC: 13.8 10*3/uL — ABNORMAL HIGH (ref 4.0–10.5)
nRBC: 0 % (ref 0.0–0.2)

## 2021-02-11 LAB — BLOOD CULTURE ID PANEL (REFLEXED) - BCID2

## 2021-02-11 LAB — COMPREHENSIVE METABOLIC PANEL
ALT: 31 U/L (ref 0–44)
AST: 23 U/L (ref 15–41)
Albumin: 3.3 g/dL — ABNORMAL LOW (ref 3.5–5.0)
Alkaline Phosphatase: 55 U/L (ref 38–126)
Anion gap: 8 (ref 5–15)
BUN: 20 mg/dL (ref 6–20)
CO2: 26 mmol/L (ref 22–32)
Calcium: 8 mg/dL — ABNORMAL LOW (ref 8.9–10.3)
Chloride: 103 mmol/L (ref 98–111)
Creatinine, Ser: 0.84 mg/dL (ref 0.44–1.00)
GFR, Estimated: >60 mL/min (ref >60)
Glucose, Bld: 105 mg/dL — ABNORMAL HIGH (ref 70–99)
Potassium: 3.6 mmol/L (ref 3.5–5.1)
Sodium: 137 mmol/L (ref 135–145)
Total Bilirubin: 0.7 mg/dL (ref 0.3–1.2)
Total Protein: 6 g/dL — ABNORMAL LOW (ref 6.5–8.1)

## 2021-02-11 LAB — PROTIME-INR
INR: 1.1 (ref 0.8–1.2)
Prothrombin Time: 14.3 seconds (ref 11.4–15.2)

## 2021-02-11 LAB — LACTIC ACID, PLASMA: Lactic Acid, Venous: 1.2 mmol/L (ref 0.5–1.9)

## 2021-02-11 LAB — RESP PANEL BY RT-PCR (FLU A&B, COVID) ARPGX2
Influenza A by PCR: NEGATIVE
Influenza B by PCR: NEGATIVE
SARS Coronavirus 2 by RT PCR: NEGATIVE

## 2021-02-11 LAB — PROCALCITONIN: Procalcitonin: <0.1 ng/mL

## 2021-02-11 LAB — CORTISOL-AM, BLOOD: Cortisol - AM: 8.6 ug/dL (ref 6.7–22.6)

## 2021-02-11 MED ORDER — ALBUTEROL SULFATE (2.5 MG/3ML) 0.083% IN NEBU: 2.5000 mg | INHALATION_SOLUTION | RESPIRATORY_TRACT | Status: DC | PRN

## 2021-02-11 MED ORDER — LACTATED RINGERS IV SOLN: INTRAVENOUS | Status: DC

## 2021-02-11 MED ORDER — UMECLIDINIUM BROMIDE 62.5 MCG/INH IN AEPB
1.0000 | INHALATION_SPRAY | Freq: Every day | RESPIRATORY_TRACT | Status: DC
  Administered 2021-02-11 – 2021-02-12 (×2): 1 via RESPIRATORY_TRACT
  Filled 2021-02-11: qty 7

## 2021-02-11 MED ORDER — ONDANSETRON HCL 4 MG/2ML IJ SOLN
4.0000 mg | Freq: Four times a day (QID) | INTRAMUSCULAR | Status: DC | PRN
  Administered 2021-02-11 – 2021-02-12 (×3): 4 mg via INTRAVENOUS
  Filled 2021-02-11 (×3): qty 2

## 2021-02-11 MED ORDER — FLUTICASONE FUROATE-VILANTEROL 200-25 MCG/INH IN AEPB
1.0000 | INHALATION_SPRAY | Freq: Every day | RESPIRATORY_TRACT | Status: DC
  Administered 2021-02-11 – 2021-02-12 (×2): 1 via RESPIRATORY_TRACT
  Filled 2021-02-11: qty 28

## 2021-02-11 MED ORDER — TRAMADOL HCL 50 MG PO TABS
50.0000 mg | ORAL_TABLET | Freq: Four times a day (QID) | ORAL | Status: DC | PRN
  Administered 2021-02-11: 50 mg via ORAL
  Filled 2021-02-11: qty 1

## 2021-02-11 MED ORDER — OXYCODONE HCL 5 MG PO TABS
5.0000 mg | ORAL_TABLET | Freq: Four times a day (QID) | ORAL | Status: DC | PRN
  Administered 2021-02-11 – 2021-02-12 (×3): 5 mg via ORAL
  Filled 2021-02-11 (×4): qty 1

## 2021-02-11 NOTE — Progress Notes (Signed)
PHARMACY - PHYSICIAN COMMUNICATION CRITICAL VALUE ALERT - BLOOD CULTURE IDENTIFICATION (BCID)  Shawna Anderson is an 55 y.o. female who presented to [facility] on 02/10/2021 with a chief complaint of abdominal pain  Assessment:  7/31 blood culture (single set) with GPC, BCID = MRSE.  On antibiotics for diveriticulitis (include suspected source if known)  Name of physician (or Provider) Contacted: Dr Serita Grit  Current antibiotics: piperacillin/tazobactam  Changes to prescribed antibiotics recommended:  Recommendations accepted by provider - suspect contaminant and will monitor on current antibiotics and repeat blood cultures as clinically indicated  Results for orders placed or performed during the hospital encounter of 02/10/21  Blood Culture ID Panel (Reflexed) (Collected: 02/10/2021  5:19 PM)  Result Value Ref Range   Enterococcus faecalis NOT DETECTED NOT DETECTED   Enterococcus Faecium NOT DETECTED NOT DETECTED   Listeria monocytogenes NOT DETECTED NOT DETECTED   Staphylococcus species DETECTED (A) NOT DETECTED   Staphylococcus aureus (BCID) NOT DETECTED NOT DETECTED   Staphylococcus epidermidis DETECTED (A) NOT DETECTED   Staphylococcus lugdunensis NOT DETECTED NOT DETECTED   Streptococcus species NOT DETECTED NOT DETECTED   Streptococcus agalactiae NOT DETECTED NOT DETECTED   Streptococcus pneumoniae NOT DETECTED NOT DETECTED   Streptococcus pyogenes NOT DETECTED NOT DETECTED   A.calcoaceticus-baumannii NOT DETECTED NOT DETECTED   Bacteroides fragilis NOT DETECTED NOT DETECTED   Enterobacterales NOT DETECTED NOT DETECTED   Enterobacter cloacae complex NOT DETECTED NOT DETECTED   Escherichia coli NOT DETECTED NOT DETECTED   Klebsiella aerogenes NOT DETECTED NOT DETECTED   Klebsiella oxytoca NOT DETECTED NOT DETECTED   Klebsiella pneumoniae NOT DETECTED NOT DETECTED   Proteus species NOT DETECTED NOT DETECTED   Salmonella species NOT DETECTED NOT DETECTED   Serratia  marcescens NOT DETECTED NOT DETECTED   Haemophilus influenzae NOT DETECTED NOT DETECTED   Neisseria meningitidis NOT DETECTED NOT DETECTED   Pseudomonas aeruginosa NOT DETECTED NOT DETECTED   Stenotrophomonas maltophilia NOT DETECTED NOT DETECTED   Candida albicans NOT DETECTED NOT DETECTED   Candida auris NOT DETECTED NOT DETECTED   Candida glabrata NOT DETECTED NOT DETECTED   Candida krusei NOT DETECTED NOT DETECTED   Candida parapsilosis NOT DETECTED NOT DETECTED   Candida tropicalis NOT DETECTED NOT DETECTED   Cryptococcus neoformans/gattii NOT DETECTED NOT DETECTED   Methicillin resistance mecA/C DETECTED (A) NOT DETECTED    Doreene Eland, PharmD, BCPS.   Work Cell: [phone] 02/11/2021 11:23 AM

## 2021-02-11 NOTE — Plan of Care (Signed)
Reviewed with patient

## 2021-02-11 NOTE — Progress Notes (Signed)
[place] [facility] [place] NAME: Shawna Anderson    MR#:  [id number]  DATE OF BIRTH:  1966/06/15  SUBJECTIVE:  came in with recurrent abdominal pain left-sided. Had some nausea vomiting now resolved. No family at bedside. This is patient's third admission since June with similar symptoms of diverticulitis/colitis  REVIEW OF SYSTEMS:   Review of Systems  Constitutional:  Negative for chills, fever and weight loss.  HENT:  Negative for ear discharge, ear pain and nosebleeds.   Eyes:  Negative for blurred vision, pain and discharge.  Respiratory:  Negative for sputum production, shortness of breath, wheezing and stridor.   Cardiovascular:  Negative for chest pain, palpitations, orthopnea and PND.  Gastrointestinal:  Positive for abdominal pain and nausea. Negative for diarrhea and vomiting.  Genitourinary:  Negative for frequency and urgency.  Musculoskeletal:  Negative for back pain and joint pain.  Neurological:  Positive for weakness. Negative for sensory change, speech change and focal weakness.  Psychiatric/Behavioral:  Negative for depression and hallucinations. The patient is not nervous/anxious.   Tolerating Diet:no Tolerating PT:   DRUG ALLERGIES:   Allergies  Allergen Reactions  . Ativan [Lorazepam]     Pt states it makes her tongue do weird things   . Augmentin [Amoxicillin-Pot Clavulanate]     Upset stomach   . Latex Other (See Comments)    Patient stated that she was told by her doctor that she is "allergic to" this  . Tape Other (See Comments)    Patient stated that she was told by her doctor that she is "allergic to" this  . Xifaxan [Rifaximin]     Pt believes this is contributing to her abdominal pain/discomfort  . Drixoral Allergy Sinus [Dexbromphen-Pse-Apap Er] Rash    VITALS:  Blood pressure 108/70, pulse 74, temperature 98 F (36.7 C), temperature source Oral, resp. rate 18, height '5\' 3"'$  (1.6 m), weight 86.2 kg, SpO2  100 %.  PHYSICAL EXAMINATION:   Physical Exam  GENERAL:  55 y.o.-year-old patient lying in the bed with no acute distress. obese LUNGS: Normal breath sounds bilaterally, no wheezing, rales, rhonchi. No use of accessory muscles of respiration.  CARDIOVASCULAR: S1, S2 normal. No murmurs, rubs, or gallops.  ABDOMEN: Soft, +tender, nondistended. Bowel sounds present. No organomegaly or mass.  EXTREMITIES: No cyanosis, clubbing or edema b/l.    NEUROLOGIC: non focal PSYCHIATRIC:  patient is alert and oriented x 3.  SKIN: No obvious rash, lesion, or ulcer.   LABORATORY PANEL:  CBC Recent Labs  Lab 02/11/21 0718  WBC 13.8*  HGB 10.9*  HCT 32.5*  PLT 426*    Chemistries  Recent Labs  Lab 02/10/21 1717 02/11/21 0718  NA 140 137  K 3.3* 3.6  CL 108 103  CO2 18* 26  GLUCOSE 148* 105*  BUN 17 20  CREATININE 0.87 0.84  CALCIUM 8.5* 8.0*  MG 2.0  --   AST 55* 23  ALT 42 31  ALKPHOS 68 55  BILITOT 1.0 0.7   Cardiac Enzymes No results for input(s): TROPONINI in the last 168 hours. RADIOLOGY:  CT ABDOMEN PELVIS WO CONTRAST  Result Date: 02/10/2021 CLINICAL DATA:  Abdominal pain. Nausea and vomiting. EXAM: CT ABDOMEN AND PELVIS WITHOUT CONTRAST TECHNIQUE: Multidetector CT imaging of the abdomen and pelvis was performed following the standard protocol without IV contrast. COMPARISON:  01/06/2021 FINDINGS: Lower chest: No acute findings. Hepatobiliary: No mass visualized on this unenhanced exam. Gallbladder is unremarkable. No evidence of  biliary ductal dilatation. Pancreas: No mass or inflammatory process visualized on this unenhanced exam. Spleen:  Within normal limits in size. Adrenals/Urinary tract: No evidence of urolithiasis or hydronephrosis. Urinary bladder is empty. Stomach/Bowel: No evidence of obstruction, inflammatory process, or abnormal fluid collections. Surgical anastomosis again seen in the sigmoid colon. Vascular/Lymphatic: No pathologically enlarged lymph nodes  identified. No evidence of abdominal aortic aneurysm. Aortic atherosclerotic calcification noted. Reproductive: Prior hysterectomy noted. Adnexal regions are unremarkable in appearance. Other:  None. Musculoskeletal:  No suspicious bone lesions identified. IMPRESSION: No acute findings or other significant abnormality identified. Aortic Atherosclerosis (ICD10-I70.0). Electronically Signed   By: Marlaine Hind M.D.   On: 02/10/2021 17:48   DG Chest Port 1 View  Result Date: 02/10/2021 CLINICAL DATA:  Questionable sepsis.  Evaluate for abnormality. EXAM: PORTABLE CHEST 1 VIEW COMPARISON:  May 22, 2020 FINDINGS: The heart size and mediastinal contours are within normal limits. Both lungs are clear. The visualized skeletal structures are unremarkable. IMPRESSION: No active disease. Electronically Signed   By: Dorise Bullion III M.D   On: 02/10/2021 17:59   CT Angio Abd/Pel W and/or Wo Contrast  Result Date: 02/10/2021 CLINICAL DATA:  Left upper quadrant abdominal pain.  Vomiting. EXAM: CTA ABDOMEN AND PELVIS WITHOUT AND WITH CONTRAST TECHNIQUE: Multidetector CT imaging of the abdomen and pelvis was performed using the standard protocol during bolus administration of intravenous contrast. Multiplanar reconstructed images and MIPs were obtained and reviewed to evaluate the vascular anatomy. CONTRAST:  114m OMNIPAQUE IOHEXOL 350 MG/ML SOLN COMPARISON:  CT abdomen pelvis earlier same day. FINDINGS: VASCULAR Aorta: Normal caliber abdominal aorta. Peripheral calcified atherosclerotic plaque. Celiac: Patent.  No evidence for aneurysm or dissection. SMA: Unremarkable Renals: Mild atherosclerotic narrowing at the origin of the right left renal arteries. IMA: Patent Inflow: Patent without evidence of aneurysm, dissection, vasculitis or significant stenosis. Proximal Outflow: Bilateral common femoral and visualized portions of the superficial and profunda femoral arteries are patent without evidence of aneurysm,  dissection, vasculitis or significant stenosis. Veins: No obvious venous abnormality within the limitations of this arterial phase study. Review of the MIP images confirms the above findings. NON-VASCULAR Lower chest: Normal heart size.  Lung bases are clear. Hepatobiliary: Liver is normal in size and contour. No focal lesion identified. Gallbladder is unremarkable. No intrahepatic or extrahepatic biliary ductal dilatation. Pancreas: Unremarkable Spleen: Unremarkable Adrenals/Urinary Tract: Normal adrenal glands. Kidneys enhance symmetrically with contrast. No hydronephrosis. Urinary bladder is unremarkable. Stomach/Bowel: Postsurgical changes involving the sigmoid colon. There is mild circumferential wall thickening of the descending colon. No evidence for free fluid or free intraperitoneal air. No evidence for bowel obstruction. Normal morphology of the stomach. Lymphatic: Normal caliber abdominal aorta. Peripheral calcified atherosclerotic plaque. No retroperitoneal lymphadenopathy. Reproductive: Prior hysterectomy. Other: None. Musculoskeletal: No aggressive or acute appearing osseous lesions. Lower thoracic lumbar spine degenerative changes. IMPRESSION: VASCULAR No acute vascular process. NON-VASCULAR There is mild circumferential wall thickening of the descending colon which may be secondary to colitis or diverticulitis. Otherwise no acute process within the abdomen or pelvis. Electronically Signed   By: DLovey NewcomerM.D.   On: 02/10/2021 19:20   ASSESSMENT AND PLAN:  MMAURIANNA ARMAGOSTis a 55y.o. female with medical history significant of CAD status post PCI, COPD, diverticulosis, anemia, anxiety, depression, bipolar, chronic pain, hypertension, GERD, OSA, dysphagia, Schatzki's ring, stress urinary incontinence who presents with ongoing abdominal pain nausea and vomiting. She states that she has had recurrent episodes of colitis and diverticulitis with intermittent nausea and abdominal  pain.      Sepsis Diverticulitis vs Colitis--recurrent --3 rd admission since June 2022 --Patient presenting with tachycardia, tachypnea, elevated white count of 23, initial lactic acid of 6.6 consistent with sepsis.  Evidence of colonic wall thickening consistent with diverticulitis versus colitis.  Favor diverticulitis given her history of this. -- on Zosyn  --  Lactic acid started to downtrend initially 6.6-- 5.2--1.2 -  Blood pressure is stable and actually was initially hypertensive in the ED.   --CTA of abdomen pelvis --ruled out mesenteric ischemia. -- Continue with IV fluids and trend lactic acid - Bowel rest, ice chips/sips with meds only  --prn nausea meds -- patient was seen by general surgery during last admission. Recommended at that time antibiotics. If she shows no improvement will consider his general surgery to see patient during this admission  Hypokalemia > Potassium noted to be 3.3 in the ED. - 2 mEq of IV potassium is unclear how well she will tolerate p.o.   CAD > Status post PCI > Cath earlier this year showed moderate nonobstructive disease - Continue home aspirin, Plavix - Continue home Imdur, Zetia, atorvastatin, Vascepa   COPD - Continue home Breztri and as needed albuterol  Anxiety Depression Bipolar - Continue home Depakote  Hypertension -- BP actually initially elevated despite evidence of sepsis as above in ED.  Improved with a dose of labetalol. - Continue home amlodipine   GERD - Continue home PPI   DVT prophylaxis:      Lovenox  Code Status:              Full  Family Communication:       None on admission : Consults : CODE STATUS:  Level of care: Med-Surg Status is: inpatient  patient admitted with regard diverticulitis. Currently getting IV antibiotics and IV fluids. NPO.   Dispo: The patient is from: Home              Anticipated d/c is to: Home              Patient currently is not medically stable to d/c.   Difficult to place patient  No        TOTAL TIME TAKING CARE OF THIS PATIENT: 30 minutes.  >50% time spent on counselling and coordination of care  Note: This dictation was prepared with Dragon dictation along with smaller phrase technology. Any transcriptional errors that result from this process are unintentional.  Fritzi Mandes M.D    [facility]   CC: Primary care physician; McLean-Scocuzza, Nino Glow, MD Patient ID: Shawna Anderson, female   DOB: [birth year], 55 y.o.   MRN: [id number]

## 2021-02-11 NOTE — ED Notes (Signed)
Patient is sleeping, chest rise and fall observed. Vitals are stable. IV fluids infusing without incidence. Call bell within reach and siderails up x 2. Awaiting admission bed.

## 2021-02-11 NOTE — Telephone Encounter (Signed)
Left message to return call 

## 2021-02-11 NOTE — ED Notes (Addendum)
Admitting MD messaged about nausea medication.

## 2021-02-11 NOTE — Telephone Encounter (Signed)
Please rec pt stop ozempic as this can worsening ab pain and nausea can resume only if GI thinks ok to resume

## 2021-02-11 NOTE — ED Notes (Signed)
Patient called out stating that she is in "severe pain" and is nauseous. Pt gagging upon entering room. No vomiting observed. Approx 15 minutes prior to RN entering the room, patient was sound asleep and snoring.

## 2021-02-12 DIAGNOSIS — F419 Anxiety disorder, unspecified: Secondary | ICD-10-CM | POA: Diagnosis not present

## 2021-02-12 DIAGNOSIS — A419 Sepsis, unspecified organism: Secondary | ICD-10-CM | POA: Diagnosis not present

## 2021-02-12 DIAGNOSIS — K5792 Diverticulitis of intestine, part unspecified, without perforation or abscess without bleeding: Secondary | ICD-10-CM | POA: Diagnosis not present

## 2021-02-12 DIAGNOSIS — F319 Bipolar disorder, unspecified: Secondary | ICD-10-CM | POA: Diagnosis not present

## 2021-02-12 LAB — URINE CULTURE: Culture: NO GROWTH

## 2021-02-12 MED ORDER — METRONIDAZOLE 500 MG PO TABS
500.0000 mg | ORAL_TABLET | Freq: Three times a day (TID) | ORAL | Status: DC
  Administered 2021-02-12: 500 mg via ORAL
  Filled 2021-02-12 (×3): qty 1

## 2021-02-12 MED ORDER — CIPROFLOXACIN HCL 500 MG PO TABS: 500.0000 mg | ORAL_TABLET | Freq: Two times a day (BID) | ORAL | 0 refills | Status: AC

## 2021-02-12 MED ORDER — CIPROFLOXACIN HCL 500 MG PO TABS
500.0000 mg | ORAL_TABLET | Freq: Two times a day (BID) | ORAL | Status: DC
  Administered 2021-02-12: 500 mg via ORAL
  Filled 2021-02-12: qty 1

## 2021-02-12 MED ORDER — METRONIDAZOLE 500 MG PO TABS
500.0000 mg | ORAL_TABLET | Freq: Three times a day (TID) | ORAL | Status: DC
  Filled 2021-02-12: qty 1

## 2021-02-12 MED ORDER — TRAMADOL HCL 50 MG PO TABS: 50.0000 mg | ORAL_TABLET | Freq: Four times a day (QID) | ORAL | 0 refills | Status: DC | PRN

## 2021-02-12 MED ORDER — METRONIDAZOLE 500 MG PO TABS: 500.0000 mg | ORAL_TABLET | Freq: Three times a day (TID) | ORAL | 0 refills | Status: AC

## 2021-02-12 NOTE — TOC Transition Note (Signed)
Transition of Care [facility]) - CM/SW Discharge Note   Patient Details  Name: Shawna Anderson MRN: [id number] Date of Birth: October 24, 1965  [facility] of [facility]) CM/SW Contact:  Candie Chroman, LCSW Phone Number: 02/12/2021, 1:53 PM   Clinical Narrative:   Patient has orders to discharge home today. No further concerns. CSW signing off.  Final next level of care: Home/Self Care Barriers to Discharge: No Barriers Identified   Patient Goals and CMS Choice        Discharge Placement                    Patient and family notified of of transfer: 02/12/21  Discharge Plan and Services                                     Social Determinants of Health (SDOH) Interventions     Readmission Risk Interventions Readmission Risk Prevention Plan 02/12/2021 01/02/2021  Transportation Screening Complete Complete  PCP or Specialist Appt within 3-5 Days Complete Complete  Social Work Consult for [place] Planning/Counseling Complete Complete  Palliative Care Screening Not Applicable Not Applicable  Medication Review Press photographer) Complete Complete  Some recent data might be hidden

## 2021-02-12 NOTE — TOC Initial Note (Signed)
Transition of Care [facility]) - Initial/Assessment Note    Patient Details  Name: Shawna Anderson MRN: [id number] Date of Birth: Sep 19, 1965  Transition [facility] Care [facility]) CM/SW Contact:    Candie Chroman, LCSW Phone Number: 02/12/2021, 10:07 AM  Clinical Narrative:  Readmission prevention screen complete. CSW met with patient. No supports at bedside. CSW introduced role and explained that discharge planning would be discussed. Assessment completed in June and patient confirmed all information was still correct. Patient lives alone. She has transportation to appointments through [facility]. Preferred pharmacy is [facility] which mails prescriptions within 3 days. She also gets prescriptions at [facility] on [address] and [facility] if needed. However transportation is a barrier for this. Per MD, plan for discharge today if she tolerates full liquid diet. Patient said if prescriptions sent now her ride can take her to pick them up on the way home. MD aware. No home health or DME use prior to admission. No further concerns. CSW encouraged patient to contact CSW as needed. CSW will continue to follow patient for support and facilitate return home.         Expected Discharge Plan: Home/Self Care Barriers to Discharge: Continued Medical Work up   Patient Goals and CMS Choice        Expected Discharge Plan and Services Expected Discharge Plan: Home/Self Care       Living arrangements for the past 2 months: Apartment                                      Prior Living Arrangements/Services Living arrangements for the past 2 months: Apartment Lives with:: Self Patient language and need for interpreter reviewed:: Yes Do you feel safe going back to the place where you live?: Yes      Need for Family Participation in Patient Care: Yes (Comment)     Criminal Activity/Legal Involvement Pertinent to Current Situation/Hospitalization: No - Comment as needed  Activities of Daily  Living Home Assistive Devices/Equipment: None ADL Screening (condition at time of admission) Patient's cognitive ability adequate to safely complete daily activities?: Yes Is the patient deaf or have difficulty hearing?: No Does the patient have difficulty seeing, even when wearing glasses/contacts?: No Does the patient have difficulty concentrating, remembering, or making decisions?: No Patient able to express need for assistance with ADLs?: Yes Does the patient have difficulty dressing or bathing?: No Independently performs ADLs?: Yes (appropriate for developmental age) Does the patient have difficulty walking or climbing stairs?: No Weakness of Legs: None Weakness of Arms/Hands: None  Permission Sought/Granted                  Emotional Assessment Appearance:: Appears stated age Attitude/Demeanor/Rapport: Engaged, Gracious Affect (typically observed): Accepting, Appropriate, Calm, Pleasant Orientation: : Oriented to Self, Oriented to Place, Oriented to  Time, Oriented to Situation Alcohol / Substance Use: Not Applicable Psych Involvement: No (comment)  Admission diagnosis:  Diverticulitis [K57.92] Sepsis ([place]) [A41.9] Sepsis without acute organ dysfunction, due to unspecified organism [facility]) [A41.9] Patient Active Problem List   Diagnosis Date Noted   Sepsis ([place]) 02/10/2021   Hypertension associated with diabetes ([place]) 01/23/2021   Obesity (BMI 30-39.9) 01/23/2021   Gastroparesis    Diverticulitis 01/01/2021   AKI (acute kidney injury) ([place]) 12/31/2020   Hypokalemia 12/31/2020   Transaminitis 12/31/2020   Acute diverticulitis 12/17/2020   Chronic pain syndrome 12/05/2020   Acute bronchitis with  COPD ([place]) 11/28/2020   Cough 11/28/2020   Accelerating angina ([place]) 09/11/2020   Chronic nausea 03/26/2020   SUI (stress urinary incontinence, female) 03/26/2020   Bronchitis 03/26/2020   Continuous LLQ abdominal pain 03/01/2020   URI with cough and congestion 03/01/2020    Esophageal dysphagia 01/26/2020   Schatzki's ring of distal esophagus 01/26/2020   Ecchymosis 01/11/2020   Chronic pain of both knees 01/11/2020   Bruising 12/05/2019   Trichomonas infection 12/05/2019   Elevated liver enzymes 11/15/2019   Prediabetes 11/15/2019   STD exposure 11/15/2019   Anemia 11/15/2019   Chronic bursitis of right shoulder 10/25/2019   Chest wall pain 09/05/2019   Poor dentition 08/18/2019   Thrombocytosis 08/18/2019   Leukocytosis 08/18/2019   Anal lesion 08/18/2019   Female pelvic pain 05/26/2019   Colitis 05/26/2019   HLD (hyperlipidemia) 12/27/2018   Vitamin D deficiency 12/24/2018   Bipolar disorder ([place]) 12/30/2017   OSA (obstructive sleep apnea) 12/30/2017   Constipation 12/30/2017   Coronary artery disease of native artery of native heart with stable angina pectoris ([place]) 12/30/2017   Gastroesophageal reflux disease 12/30/2017   Asthma 12/29/2017   COPD (chronic obstructive pulmonary disease) ([place]) 12/29/2017   Anxiety and depression 12/29/2017   Insomnia 12/29/2017   Chronic back pain 12/29/2017   Skin lesion of back 12/29/2017   CAD S/P CFX PCI 2010 04/15/2016   Essential hypertension 04/15/2016   Hyperlipidemia associated with type 2 diabetes mellitus ([place]) 04/15/2016   Noncompliance with medication regimen 04/15/2016   Chest pain with moderate risk of acute coronary syndrome 04/12/2016   Chronic abdominal pain 01/12/2015   Vomiting 01/12/2015   Nausea and vomiting 01/12/2015   PCP:  McLean-Scocuzza, Nino Glow, MD Pharmacy:   [facility] [place], [address] [place] [address] Phone: [phone] Fax: [phone]  [facility]  ([place]) - [place], [address] [place] [address] Phone: [phone] Fax: [phone]     Social Determinants of Health (SDOH) Interventions     Readmission Risk Interventions Readmission Risk Prevention Plan 02/12/2021 01/02/2021  Transportation Screening Complete Complete  PCP or Specialist Appt within 3-5 Days Complete Complete  Social Work Consult for [place] Planning/Counseling Complete Complete  Palliative Care Screening Not Applicable Not Applicable  Medication Review Press photographer) Complete Complete  Some recent data might be hidden

## 2021-02-12 NOTE — Discharge Summary (Signed)
[facility] NAME: Shawna [place]    MR#:  [id number]  DATE OF BIRTH:  [birth year]  DATE OF ADMISSION:  02/10/2021 ADMITTING PHYSICIAN: Fritzi Mandes, MD  DATE OF DISCHARGE: 02/12/2021  PRIMARY CARE PHYSICIAN: McLean-Scocuzza, Nino Glow, MD    ADMISSION DIAGNOSIS:  Diverticulitis [K57.92] Sepsis ([place]) [A41.9] Sepsis without acute organ dysfunction, due to unspecified organism ([place]) [A41.9]  DISCHARGE DIAGNOSIS:  Sepsis due to Recurrent Diverticulitis   SECONDARY DIAGNOSIS:   Past Medical History:  Diagnosis Date  . Anxiety   . Asthma   . Bipolar 1 disorder ([place])   . Bipolar disorder ([place])   . CAD (coronary artery disease)    s/p stent BMS OM Cx  . Cervical herniated disc 04/12/2016  . COPD (chronic obstructive pulmonary disease) ([place])   . Depression   . Diabetes mellitus without complication ([place])   . Diverticulitis   . GERD (gastroesophageal reflux disease)   . Glaucoma   . History of blood transfusion   . Hyperlipidemia   . Hypertension   . OSA (obstructive sleep apnea)    not using cpap   . Plantar fasciitis    b/l feet s/p steroid shots w/o help and left surgery w/o help   . UTI (urinary tract infection)     HOSPITAL COURSE:   Shawna [place] is a 55 y.o. female with medical history significant of CAD status post PCI, COPD, diverticulosis, anemia, anxiety, depression, bipolar, chronic pain, hypertension, GERD, OSA, dysphagia, Schatzki's ring, stress urinary incontinence who presents with ongoing abdominal pain nausea and vomiting. She states that she has had recurrent episodes of colitis and diverticulitis with intermittent nausea and abdominal pain.       Sepsis Diverticulitis vs Colitis--recurrent --3 rd admission since June 2022 --Patient presenting with tachycardia, tachypnea, elevated white count of 23, initial lactic acid of 6.6 consistent with sepsis.  Evidence of colonic wall thickening consistent  with diverticulitis versus colitis.  Favor diverticulitis given her history of this. -- on Zosyn--change to po cipro and  flagyl (tolerated ok) --  Lactic acid started to downtrend initially 6.6-- 5.2--1.2 -  Blood pressure is stable and actually was initially hypertensive in the ED.   --CTA of abdomen pelvis --ruled out mesenteric ischemia. -- recieved with IV fluids  - Bowel rest, ice chips/sips with meds only  --prn nausea meds -- patient was seen by general surgery during last admission. Recommended at that time antibiotics. Pt advised to see, f/u gen surgery if her symptoms recurrs --8/2 tolerated po FLD. Wants to go home and rest --sepsis improved  Hypokalemia > Potassium noted to be 3.3 in the ED. --po k    CAD > Status post PCI > Cath earlier this year showed moderate nonobstructive disease - Continue home aspirin, Plavix - Continue home Imdur, Zetia, atorvastatin, Vascepa   COPD - Continue home Breztri and as needed albuterol  Anxiety Depression Bipolar - Continue home Depakote  Hypertension -- BP actually initially elevated despite evidence of sepsis as above in ED.  Improved with a dose of labetalol. - Continue home amlodipine   GERD - Continue home PPI   DVT prophylaxis:      Lovenox  Code Status:              Full  Family Communication:   none today Consults : CODE STATUS:  Level of care: Med-Surg Status is: inpatient       Dispo: The patient is from: Home  Anticipated d/c is to: Home              Patient currently is medically stable to d/c.              Difficult to place patient No     CONSULTS OBTAINED:    DRUG ALLERGIES:   Allergies  Allergen Reactions  . Ativan [Lorazepam]     Pt states it makes her tongue do weird things   . Augmentin [Amoxicillin-Pot Clavulanate]     Upset stomach   . Latex Other (See Comments)    Patient stated that she was told by her doctor that she is "allergic to" this  . Tape Other (See  Comments)    Patient stated that she was told by her doctor that she is "allergic to" this  . Xifaxan [Rifaximin]     Pt believes this is contributing to her abdominal pain/discomfort  . Drixoral Allergy Sinus [Dexbromphen-Pse-Apap Er] Rash    DISCHARGE MEDICATIONS:   Allergies as of 02/12/2021       Reactions   Ativan [lorazepam]    Pt states it makes her tongue do weird things    Augmentin [amoxicillin-pot Clavulanate]    Upset stomach   Latex Other (See Comments)   Patient stated that she was told by her doctor that she is "allergic to" this   Tape Other (See Comments)   Patient stated that she was told by her doctor that she is "allergic to" this   Xifaxan [rifaximin]    Pt believes this is contributing to her abdominal pain/discomfort   Drixoral Allergy Sinus [dexbromphen-pse-apap Er] Rash        Medication List     STOP taking these medications    levocetirizine 5 MG tablet Commonly known as: XYZAL   Ozempic (0.25 or 0.5 MG/DOSE) 2 MG/1.5ML Sopn Generic drug: Semaglutide(0.25 or 0.'5MG'$ /DOS)   Mancel Parsons 2.4 MG/0.75ML Soaj Generic drug: Semaglutide-Weight Management       TAKE these medications    albuterol 108 (90 Base) MCG/ACT inhaler Commonly known as: ProAir HFA Inhale 1-2 puffs into the lungs every 4 (four) hours as needed for wheezing or shortness of breath.   amLODipine 10 MG tablet Commonly known as: NORVASC Take 1 tablet (10 mg total) by mouth at bedtime.   aspirin 81 MG chewable tablet Chew 81 mg by mouth daily.   atorvastatin 80 MG tablet Commonly known as: LIPITOR Take 1 tablet (80 mg total) by mouth daily at 6 PM.   Breztri Aerosphere 160-9-4.8 MCG/ACT Aero Generic drug: Budeson-Glycopyrrol-Formoterol Inhale 2 puffs into the lungs in the morning and at bedtime. Rinse   ciprofloxacin 500 MG tablet Commonly known as: CIPRO Take 1 tablet (500 mg total) by mouth 2 (two) times daily for 6 days.   clopidogrel 75 MG tablet Commonly known as:  PLAVIX Take 1 tablet (75 mg total) by mouth daily.   divalproex 500 MG DR tablet Commonly known as: DEPAKOTE TAKE 1 TABLET BY MOUTH  TWICE DAILY   ezetimibe 10 MG tablet Commonly known as: ZETIA TAKE 1 TABLET BY MOUTH  DAILY WITH LIPITOR 80 MG AT NIGHT   icosapent Ethyl 1 g capsule Commonly known as: Vascepa Take 2 capsules (2 g total) by mouth 2 (two) times daily.   isosorbide mononitrate 60 MG 24 hr tablet Commonly known as: IMDUR Take 1 tablet (60 mg total) by mouth in the morning and at bedtime.   methocarbamol 500 MG tablet Commonly known as: Robaxin Take 1  tablet (500 mg total) by mouth 2 (two) times daily as needed for muscle spasms.   metroNIDAZOLE 500 MG tablet Commonly known as: FLAGYL Take 1 tablet (500 mg total) by mouth every 8 (eight) hours for 6 days.   nitroGLYCERIN 0.4 MG SL tablet Commonly known as: NITROSTAT DISSOLVE 1 TABLET UNDER  TONGUE EVERY 5 MINUTES AS  NEEDED FOR CHEST PAIN MAX  OF 3 TABLETS IN 15 MINUTES  . CALL 911 AFTER THIRD DOSE   olmesartan-hydrochlorothiazide 20-12.5 MG tablet Commonly known as: BENICAR HCT Take 0.5 tablets by mouth daily. If  BP >130/>80 may increase to 1 pill qam. Stop 40-25 mg dose   pantoprazole 40 MG tablet Commonly known as: PROTONIX TAKE 1 TABLET BY MOUTH  TWICE DAILY BEFORE MEALS   traMADol 50 MG tablet Commonly known as: ULTRAM Take 1 tablet (50 mg total) by mouth every 6 (six) hours as needed for moderate pain.   traZODone 150 MG tablet Commonly known as: DESYREL Take 1 tablet (150 mg total) by mouth at bedtime as needed for sleep.   Vitamin D (Cholecalciferol) 25 MCG (1000 UT) Caps Take 1 capsule by mouth daily.        If you experience worsening of your admission symptoms, develop shortness of breath, life threatening emergency, suicidal or homicidal thoughts you must seek medical attention immediately by calling 911 or calling your MD immediately  if symptoms less severe.  You Must read complete  instructions/literature along with all the possible adverse reactions/side effects for all the Medicines you take and that have been prescribed to you. Take any new Medicines after you have completely understood and accept all the possible adverse reactions/side effects.   Please note  You were cared for by a hospitalist during your hospital stay. If you have any questions about your discharge medications or the care you received while you were in the hospital after you are discharged, you can call the unit and asked to speak with the hospitalist on call if the hospitalist that took care of you is not available. Once you are discharged, your primary care physician will handle any further medical issues. Please note that NO REFILLS for any discharge medications will be authorized once you are discharged, as it is imperative that you return to your primary care physician (or establish a relationship with a primary care physician if you do not have one) for your aftercare needs so that they can reassess your need for medications and monitor your lab values. Today   SUBJECTIVE   Doing ok. Pain better thanyday  VITAL SIGNS:  Blood pressure 102/74, pulse 69, temperature 98.5 F (36.9 C), temperature source Oral, resp. rate 18, height '5\' 3"'$  (1.6 m), weight 86.2 kg, SpO2 100 %.  I/O:   Intake/Output Summary (Last 24 hours) at 02/12/2021 1346 Last data filed at 02/12/2021 0900 Gross per 24 hour  Intake 1392.37 ml  Output --  Net 1392.37 ml    PHYSICAL EXAMINATION:  GENERAL:  55 y.o.-year-old patient lying in the bed with no acute distress.  LUNGS: Normal breath sounds bilaterally, no wheezing, rales,rhonchi or crepitation. No use of accessory muscles of respiration.  CARDIOVASCULAR: S1, S2 normal. No murmurs, rubs, or gallops.  ABDOMEN: Soft, mild Left LQ tender, non-distended. Bowel sounds present. No organomegaly or mass.  EXTREMITIES: No pedal edema, cyanosis, or clubbing.  NEUROLOGIC: Cranial  nerves II through XII are intact. Muscle strength 5/5 in all extremities. Sensation intact. Gait not checked.  PSYCHIATRIC: The patient is  alert and oriented x 3.  SKIN: No obvious rash, lesion, or ulcer.   DATA REVIEW:   CBC  Recent Labs  Lab 02/11/21 0718  WBC 13.8*  HGB 10.9*  HCT 32.5*  PLT 426*    Chemistries  Recent Labs  Lab 02/10/21 1717 02/11/21 0718  NA 140 137  K 3.3* 3.6  CL 108 103  CO2 18* 26  GLUCOSE 148* 105*  BUN 17 20  CREATININE 0.87 0.84  CALCIUM 8.5* 8.0*  MG 2.0  --   AST 55* 23  ALT 42 31  ALKPHOS 68 55  BILITOT 1.0 0.7    Microbiology Results   Recent Results (from the past 240 hour(s))  Urine Culture     Status: None   Collection Time: 02/10/21  3:51 PM   Specimen: In/Out Cath Urine  Result Value Ref Range Status   Specimen Description   Final    IN/OUT CATH URINE Performed at [facility], [address], [place], [place] [address]    Special Requests   Final    NONE Performed at [facility], [address], [place], [place] [address]    Culture   Final    NO GROWTH Performed at [facility], [place] [address], [place], [place] [address]    Report Status 02/12/2021 FINAL  Final  Blood culture (routine single)     Status: Abnormal (Preliminary result)   Collection Time: 02/10/21  5:19 PM   Specimen: BLOOD  Result Value Ref Range Status   Specimen Description   Final    BLOOD LFA Performed at [facility], [address] [place], [place] [address]    Special Requests   Final    BOTTLES DRAWN AEROBIC AND ANAEROBIC Blood Culture results may not be optimal due to an inadequate volume of blood received in culture bottles Performed at [facility], [address], [place], [place] [address]    Culture  Setup Time   Final    GRAM POSITIVE COCCI ANAEROBIC BOTTLE ONLY Organism ID to follow CRITICAL RESULT CALLED TO, READ BACK BY AND VERIFIED WITH: Rito Ehrlich, PHARMD AT 1108 ON 02/11/21  BY GM Performed at [facility], [address] [place], [place] [address]    Culture (A)  Final    STAPHYLOCOCCUS EPIDERMIDIS THE SIGNIFICANCE OF ISOLATING THIS ORGANISM FROM A SINGLE VENIPUNCTURE CANNOT BE PREDICTED WITHOUT FURTHER CLINICAL AND CULTURE CORRELATION. SUSCEPTIBILITIES AVAILABLE ONLY ON REQUEST. Performed at [facility], [place] [address] [place],  [address]    Report Status PENDING  Incomplete  Blood Culture ID Panel (Reflexed)     Status: Abnormal   Collection Time: 02/10/21  5:19 PM  Result Value Ref Range Status   Enterococcus faecalis NOT DETECTED NOT DETECTED Final   Enterococcus Faecium NOT DETECTED NOT DETECTED Final   Listeria monocytogenes NOT DETECTED NOT DETECTED Final   Staphylococcus species DETECTED (A) NOT DETECTED Final    Comment: CRITICAL RESULT CALLED TO, READ BACK BY AND VERIFIED WITH: Rito Ehrlich, PHARMD AT 1108 ON 02/11/21 BY GM    Staphylococcus aureus (BCID) NOT DETECTED NOT DETECTED Final   Staphylococcus epidermidis DETECTED (A) NOT DETECTED Final    Comment: Methicillin (oxacillin) resistant coagulase negative staphylococcus. Possible blood culture contaminant (unless isolated from more than one blood culture draw or clinical case suggests pathogenicity). No antibiotic treatment is indicated for blood  culture contaminants. CRITICAL RESULT CALLED TO, READ BACK BY AND VERIFIED WITH: Rito Ehrlich,  PHARMD AT 1108 ON 02/11/21 BY GM    Staphylococcus lugdunensis NOT DETECTED NOT DETECTED Final   Streptococcus species NOT DETECTED NOT DETECTED Final   Streptococcus agalactiae NOT DETECTED NOT DETECTED Final   Streptococcus pneumoniae NOT DETECTED NOT DETECTED Final   Streptococcus pyogenes NOT DETECTED NOT DETECTED Final   A.calcoaceticus-baumannii NOT DETECTED NOT DETECTED Final   Bacteroides fragilis NOT DETECTED NOT DETECTED Final   Enterobacterales NOT DETECTED NOT DETECTED Final   Enterobacter cloacae complex NOT DETECTED  NOT DETECTED Final   Escherichia coli NOT DETECTED NOT DETECTED Final   Klebsiella aerogenes NOT DETECTED NOT DETECTED Final   Klebsiella oxytoca NOT DETECTED NOT DETECTED Final   Klebsiella pneumoniae NOT DETECTED NOT DETECTED Final   Proteus species NOT DETECTED NOT DETECTED Final   Salmonella species NOT DETECTED NOT DETECTED Final   Serratia marcescens NOT DETECTED NOT DETECTED Final   Haemophilus influenzae NOT DETECTED NOT DETECTED Final   Neisseria meningitidis NOT DETECTED NOT DETECTED Final   Pseudomonas aeruginosa NOT DETECTED NOT DETECTED Final   Stenotrophomonas maltophilia NOT DETECTED NOT DETECTED Final   Candida albicans NOT DETECTED NOT DETECTED Final   Candida auris NOT DETECTED NOT DETECTED Final   Candida glabrata NOT DETECTED NOT DETECTED Final   Candida krusei NOT DETECTED NOT DETECTED Final   Candida parapsilosis NOT DETECTED NOT DETECTED Final   Candida tropicalis NOT DETECTED NOT DETECTED Final   Cryptococcus neoformans/gattii NOT DETECTED NOT DETECTED Final   Methicillin resistance mecA/C DETECTED (A) NOT DETECTED Final    Comment: CRITICAL RESULT CALLED TO, READ BACK BY AND VERIFIED WITH: Rito Ehrlich, PHARMD AT 1108 ON 02/11/21 BY GM Performed at [facility], [address] [place], [place] [address]   Resp Panel by RT-PCR (Flu A&B, Covid) Nasopharyngeal Swab     Status: None   Collection Time: 02/11/21 10:11 AM   Specimen: Nasopharyngeal Swab; Nasopharyngeal(NP) swabs in vial transport medium  Result Value Ref Range Status   SARS Coronavirus 2 by RT PCR NEGATIVE NEGATIVE Final    Comment: (NOTE) SARS-CoV-2 target nucleic acids are NOT DETECTED.  The SARS-CoV-2 RNA is generally detectable in upper respiratory specimens during the acute phase of infection. The lowest concentration of SARS-CoV-2 viral copies this assay can detect is 138 copies/mL. A negative result does not preclude SARS-Cov-2 infection and should not be used as the sole basis  for treatment or other patient management decisions. A negative result may occur with  improper specimen collection/handling, submission of specimen other than nasopharyngeal swab, presence of viral mutation(s) within the areas targeted by this assay, and inadequate number of viral copies(<138 copies/mL). A negative result must be combined with clinical observations, patient history, and epidemiological information. The expected result is Negative.  Fact Sheet for Patients:  EntrepreneurPulse.com.au  Fact Sheet for Healthcare Providers:  IncredibleEmployment.be  This test is no t yet approved or cleared by the [place] FDA and  has been authorized for detection and/or diagnosis of SARS-CoV-2 by FDA under an Emergency Use Authorization (EUA). This EUA will remain  in effect (meaning this test can be used) for the duration of the COVID-19 declaration under Section 564(b)(1) of the Act, 21 U.S.C.section 360bbb-3(b)(1), unless the authorization is terminated  or revoked sooner.       Influenza A by PCR NEGATIVE NEGATIVE Final   Influenza B by PCR NEGATIVE NEGATIVE Final    Comment: (NOTE) The Xpert Xpress SARS-CoV-2/FLU/RSV plus assay is intended as an aid in the diagnosis of influenza  from Nasopharyngeal swab specimens and should not be used as a sole basis for treatment. Nasal washings and aspirates are unacceptable for Xpert Xpress SARS-CoV-2/FLU/RSV testing.  Fact Sheet for Patients: EntrepreneurPulse.com.au  Fact Sheet for Healthcare Providers: IncredibleEmployment.be  This test is not yet approved or cleared by the [place] FDA and has been authorized for detection and/or diagnosis of SARS-CoV-2 by FDA under an Emergency Use Authorization (EUA). This EUA will remain in effect (meaning this test can be used) for the duration of the COVID-19 declaration under Section 564(b)(1) of the Act, 21  U.S.C. section 360bbb-3(b)(1), unless the authorization is terminated or revoked.  Performed at [facility], [address] [place], [place] [address]     RADIOLOGY:  CT ABDOMEN PELVIS WO CONTRAST  Result Date: 02/10/2021 CLINICAL DATA:  Abdominal pain. Nausea and vomiting. EXAM: CT ABDOMEN AND PELVIS WITHOUT CONTRAST TECHNIQUE: Multidetector CT imaging of the abdomen and pelvis was performed following the standard protocol without IV contrast. COMPARISON:  01/06/2021 FINDINGS: Lower chest: No acute findings. Hepatobiliary: No mass visualized on this unenhanced exam. Gallbladder is unremarkable. No evidence of biliary ductal dilatation. Pancreas: No mass or inflammatory process visualized on this unenhanced exam. Spleen:  Within normal limits in size. Adrenals/Urinary tract: No evidence of urolithiasis or hydronephrosis. Urinary bladder is empty. Stomach/Bowel: No evidence of obstruction, inflammatory process, or abnormal fluid collections. Surgical anastomosis again seen in the sigmoid colon. Vascular/Lymphatic: No pathologically enlarged lymph nodes identified. No evidence of abdominal aortic aneurysm. Aortic atherosclerotic calcification noted. Reproductive: Prior hysterectomy noted. Adnexal regions are unremarkable in appearance. Other:  None. Musculoskeletal:  No suspicious bone lesions identified. IMPRESSION: No acute findings or other significant abnormality identified. Aortic Atherosclerosis (ICD10-I70.0). Electronically Signed   By: Marlaine Hind M.D.   On: 02/10/2021 17:48   DG Chest Port 1 View  Result Date: 02/10/2021 CLINICAL DATA:  Questionable sepsis.  Evaluate for abnormality. EXAM: PORTABLE CHEST 1 VIEW COMPARISON:  May 22, 2020 FINDINGS: The heart size and mediastinal contours are within normal limits. Both lungs are clear. The visualized skeletal structures are unremarkable. IMPRESSION: No active disease. Electronically Signed   By: Dorise Bullion III M.D   On:  02/10/2021 17:59   CT Angio Abd/Pel W and/or Wo Contrast  Result Date: 02/10/2021 CLINICAL DATA:  Left upper quadrant abdominal pain.  Vomiting. EXAM: CTA ABDOMEN AND PELVIS WITHOUT AND WITH CONTRAST TECHNIQUE: Multidetector CT imaging of the abdomen and pelvis was performed using the standard protocol during bolus administration of intravenous contrast. Multiplanar reconstructed images and MIPs were obtained and reviewed to evaluate the vascular anatomy. CONTRAST:  159m OMNIPAQUE IOHEXOL 350 MG/ML SOLN COMPARISON:  CT abdomen pelvis earlier same day. FINDINGS: VASCULAR Aorta: Normal caliber abdominal aorta. Peripheral calcified atherosclerotic plaque. Celiac: Patent.  No evidence for aneurysm or dissection. SMA: Unremarkable Renals: Mild atherosclerotic narrowing at the origin of the right left renal arteries. IMA: Patent Inflow: Patent without evidence of aneurysm, dissection, vasculitis or significant stenosis. Proximal Outflow: Bilateral common femoral and visualized portions of the superficial and profunda femoral arteries are patent without evidence of aneurysm, dissection, vasculitis or significant stenosis. Veins: No obvious venous abnormality within the limitations of this arterial phase study. Review of the MIP images confirms the above findings. NON-VASCULAR Lower chest: Normal heart size.  Lung bases are clear. Hepatobiliary: Liver is normal in size and contour. No focal lesion identified. Gallbladder is unremarkable. No intrahepatic or extrahepatic biliary ductal dilatation. Pancreas: Unremarkable Spleen: Unremarkable Adrenals/Urinary Tract: Normal adrenal glands. Kidneys  enhance symmetrically with contrast. No hydronephrosis. Urinary bladder is unremarkable. Stomach/Bowel: Postsurgical changes involving the sigmoid colon. There is mild circumferential wall thickening of the descending colon. No evidence for free fluid or free intraperitoneal air. No evidence for bowel obstruction. Normal  morphology of the stomach. Lymphatic: Normal caliber abdominal aorta. Peripheral calcified atherosclerotic plaque. No retroperitoneal lymphadenopathy. Reproductive: Prior hysterectomy. Other: None. Musculoskeletal: No aggressive or acute appearing osseous lesions. Lower thoracic lumbar spine degenerative changes. IMPRESSION: VASCULAR No acute vascular process. NON-VASCULAR There is mild circumferential wall thickening of the descending colon which may be secondary to colitis or diverticulitis. Otherwise no acute process within the abdomen or pelvis. Electronically Signed   By: Lovey Newcomer M.D.   On: 02/10/2021 19:20     CODE STATUS:     Code Status Orders  (From admission, onward)           Start     Ordered   02/10/21 1954  Full code  Continuous        02/10/21 1956           Code Status History     Date Active Date Inactive Code Status Order ID Comments User Context   12/31/2020 1837 01/09/2021 1726 Full Code [id number]  Orene Desanctis, DO ED   12/17/2020 1923 12/19/2020 1853 Full Code [id number]  Marcelyn Bruins, MD ED   09/11/2020 0844 09/11/2020 1606 Full Code [id number]  Nelva Bush, MD Inpatient   04/12/2016 1113 04/15/2016 1255 Full Code [id number]  Thayer Headings, MD Inpatient   01/12/2015 2243 01/13/2015 1630 Full Code [id number]  Robert Bellow, MD ED      Advance Directive Documentation    Flowsheet Row Most Recent Value  Type of Advance Directive Living will  Pre-existing out of facility DNR order (yellow form or pink MOST form) --  "MOST" Form in Place? --        TOTAL TIME TAKING CARE OF THIS PATIENT: 40 minutes.    Fritzi Mandes M.D  [facility]    CC: Primary care physician; McLean-Scocuzza, Nino Glow, MD

## 2021-02-12 NOTE — Discharge Instructions (Signed)
Full liquid diet  for few days and advance to soft

## 2021-02-13 ENCOUNTER — Telehealth: Payer: Self-pay

## 2021-02-13 LAB — CULTURE, BLOOD (SINGLE)

## 2021-02-13 NOTE — Telephone Encounter (Signed)
Patient informed and verbalized understanding

## 2021-02-13 NOTE — Telephone Encounter (Signed)
Patient decline HFU with PCP at this time. Follow up scheduled with GI 03/11/21. Agrees to contact PCP if worsening conditions and as needed.

## 2021-02-14 ENCOUNTER — Inpatient Hospital Stay: Payer: Medicare Other

## 2021-02-18 ENCOUNTER — Other Ambulatory Visit: Payer: Self-pay | Admitting: Internal Medicine

## 2021-02-18 DIAGNOSIS — Z1231 Encounter for screening mammogram for malignant neoplasm of breast: Secondary | ICD-10-CM

## 2021-02-18 DIAGNOSIS — N6310 Unspecified lump in the right breast, unspecified quadrant: Secondary | ICD-10-CM

## 2021-02-19 ENCOUNTER — Observation Stay: Payer: Medicare Other

## 2021-02-19 ENCOUNTER — Encounter: Payer: Self-pay | Admitting: Emergency Medicine

## 2021-02-19 ENCOUNTER — Encounter: Payer: Self-pay | Admitting: Podiatry

## 2021-02-19 ENCOUNTER — Emergency Department: Payer: Medicare Other

## 2021-02-19 ENCOUNTER — Observation Stay
Admission: EM | Admit: 2021-02-19 | Discharge: 2021-02-20 | Disposition: A | Discharge status: Home / Self Care | Payer: Medicare Other | Attending: Internal Medicine | Admitting: Internal Medicine

## 2021-02-19 ENCOUNTER — Other Ambulatory Visit: Payer: Self-pay

## 2021-02-19 DIAGNOSIS — R1013 Epigastric pain: Secondary | ICD-10-CM | POA: Diagnosis not present

## 2021-02-19 DIAGNOSIS — Z20822 Contact with and (suspected) exposure to covid-19: Secondary | ICD-10-CM | POA: Insufficient documentation

## 2021-02-19 DIAGNOSIS — I10 Essential (primary) hypertension: Secondary | ICD-10-CM | POA: Insufficient documentation

## 2021-02-19 DIAGNOSIS — E872 Acidosis: Secondary | ICD-10-CM

## 2021-02-19 DIAGNOSIS — Z7982 Long term (current) use of aspirin: Secondary | ICD-10-CM | POA: Diagnosis not present

## 2021-02-19 DIAGNOSIS — J449 Chronic obstructive pulmonary disease, unspecified: Secondary | ICD-10-CM | POA: Diagnosis not present

## 2021-02-19 DIAGNOSIS — R112 Nausea with vomiting, unspecified: Secondary | ICD-10-CM | POA: Diagnosis not present

## 2021-02-19 DIAGNOSIS — I2510 Atherosclerotic heart disease of native coronary artery without angina pectoris: Secondary | ICD-10-CM | POA: Diagnosis not present

## 2021-02-19 DIAGNOSIS — D72829 Elevated white blood cell count, unspecified: Secondary | ICD-10-CM

## 2021-02-19 DIAGNOSIS — Z955 Presence of coronary angioplasty implant and graft: Secondary | ICD-10-CM | POA: Insufficient documentation

## 2021-02-19 DIAGNOSIS — R000 Tachycardia, unspecified: Secondary | ICD-10-CM

## 2021-02-19 DIAGNOSIS — R7989 Other specified abnormal findings of blood chemistry: Secondary | ICD-10-CM | POA: Diagnosis present

## 2021-02-19 DIAGNOSIS — R6510 Systemic inflammatory response syndrome (SIRS) of non-infectious origin without acute organ dysfunction: Secondary | ICD-10-CM | POA: Diagnosis present

## 2021-02-19 DIAGNOSIS — Z91040 Latex allergy status: Secondary | ICD-10-CM | POA: Insufficient documentation

## 2021-02-19 DIAGNOSIS — Z79899 Other long term (current) drug therapy: Secondary | ICD-10-CM | POA: Insufficient documentation

## 2021-02-19 DIAGNOSIS — Z87891 Personal history of nicotine dependence: Secondary | ICD-10-CM | POA: Insufficient documentation

## 2021-02-19 DIAGNOSIS — Z7902 Long term (current) use of antithrombotics/antiplatelets: Secondary | ICD-10-CM | POA: Insufficient documentation

## 2021-02-19 DIAGNOSIS — K219 Gastro-esophageal reflux disease without esophagitis: Secondary | ICD-10-CM | POA: Diagnosis not present

## 2021-02-19 DIAGNOSIS — E8729 Other acidosis: Secondary | ICD-10-CM | POA: Diagnosis present

## 2021-02-19 DIAGNOSIS — R1030 Lower abdominal pain, unspecified: Secondary | ICD-10-CM | POA: Diagnosis present

## 2021-02-19 DIAGNOSIS — R1110 Vomiting, unspecified: Secondary | ICD-10-CM | POA: Diagnosis not present

## 2021-02-19 DIAGNOSIS — E119 Type 2 diabetes mellitus without complications: Secondary | ICD-10-CM | POA: Diagnosis not present

## 2021-02-19 DIAGNOSIS — J45909 Unspecified asthma, uncomplicated: Secondary | ICD-10-CM | POA: Diagnosis not present

## 2021-02-19 DIAGNOSIS — E1169 Type 2 diabetes mellitus with other specified complication: Secondary | ICD-10-CM | POA: Diagnosis present

## 2021-02-19 DIAGNOSIS — R109 Unspecified abdominal pain: Secondary | ICD-10-CM | POA: Diagnosis not present

## 2021-02-19 LAB — COMPREHENSIVE METABOLIC PANEL
ALT: 26 U/L (ref 0–44)
AST: 30 U/L (ref 15–41)
Albumin: 4.5 g/dL (ref 3.5–5.0)
Alkaline Phosphatase: 60 U/L (ref 38–126)
Anion gap: 18 — ABNORMAL HIGH (ref 5–15)
BUN: 15 mg/dL (ref 6–20)
CO2: 23 mmol/L (ref 22–32)
Calcium: 9.8 mg/dL (ref 8.9–10.3)
Chloride: 98 mmol/L (ref 98–111)
Creatinine, Ser: 1.01 mg/dL — ABNORMAL HIGH (ref 0.44–1.00)
GFR, Estimated: >60 mL/min (ref >60)
Glucose, Bld: 180 mg/dL — ABNORMAL HIGH (ref 70–99)
Potassium: 3.7 mmol/L (ref 3.5–5.1)
Sodium: 139 mmol/L (ref 135–145)
Total Bilirubin: 0.8 mg/dL (ref 0.3–1.2)
Total Protein: 7.7 g/dL (ref 6.5–8.1)

## 2021-02-19 LAB — LACTIC ACID, PLASMA
Lactic Acid, Venous: <0.3 mmol/L — ABNORMAL LOW (ref 0.5–1.9)
Lactic Acid, Venous: 2.6 mmol/L (ref 0.5–1.9)
Lactic Acid, Venous: 1.8 mmol/L (ref 0.5–1.9)

## 2021-02-19 LAB — URINALYSIS, COMPLETE (UACMP) WITH MICROSCOPIC
Bacteria, UA: NONE SEEN
Bilirubin Urine: NEGATIVE
Glucose, UA: NEGATIVE mg/dL
Ketones, ur: 5 mg/dL — AB
Leukocytes,Ua: NEGATIVE
Nitrite: NEGATIVE
Protein, ur: NEGATIVE mg/dL
Specific Gravity, Urine: >1.046 — ABNORMAL HIGH (ref 1.005–1.030)
pH: 9 — ABNORMAL HIGH (ref 5.0–8.0)

## 2021-02-19 LAB — URINE DRUG SCREEN, QUALITATIVE (ARMC ONLY)
Amphetamines, Ur Screen: NOT DETECTED
Barbiturates, Ur Screen: NOT DETECTED
Benzodiazepine, Ur Scrn: NOT DETECTED
Cannabinoid 50 Ng, Ur ~~LOC~~: POSITIVE — AB
Cocaine Metabolite,Ur ~~LOC~~: POSITIVE — AB
MDMA (Ecstasy)Ur Screen: NOT DETECTED
Methadone Scn, Ur: NOT DETECTED
Opiate, Ur Screen: POSITIVE — AB
Phencyclidine (PCP) Ur S: NOT DETECTED
Tricyclic, Ur Screen: NOT DETECTED

## 2021-02-19 LAB — CBC
HCT: 39.4 % (ref 36.0–46.0)
Hemoglobin: 13.8 g/dL (ref 12.0–15.0)
MCH: 30.6 pg (ref 26.0–34.0)
MCHC: 35 g/dL (ref 30.0–36.0)
MCV: 87.4 fL (ref 80.0–100.0)
Platelets: 569 10*3/uL — ABNORMAL HIGH (ref 150–400)
RBC: 4.51 MIL/uL (ref 3.87–5.11)
RDW: 12.8 % (ref 11.5–15.5)
WBC: 18 10*3/uL — ABNORMAL HIGH (ref 4.0–10.5)
nRBC: 0 % (ref 0.0–0.2)

## 2021-02-19 LAB — LIPASE, BLOOD: Lipase: 60 U/L — ABNORMAL HIGH (ref 11–51)

## 2021-02-19 LAB — RESP PANEL BY RT-PCR (FLU A&B, COVID) ARPGX2
Influenza A by PCR: NEGATIVE
Influenza B by PCR: NEGATIVE
SARS Coronavirus 2 by RT PCR: NEGATIVE

## 2021-02-19 LAB — GLUCOSE, CAPILLARY: Glucose-Capillary: 107 mg/dL — ABNORMAL HIGH (ref 70–99)

## 2021-02-19 LAB — MAGNESIUM: Magnesium: 2.1 mg/dL (ref 1.7–2.4)

## 2021-02-19 LAB — HEMOGLOBIN A1C
Hgb A1c MFr Bld: 6.2 % — ABNORMAL HIGH (ref 4.8–5.6)
Mean Plasma Glucose: 131.24 mg/dL

## 2021-02-19 LAB — TSH: TSH: 3.052 u[IU]/mL (ref 0.350–4.500)

## 2021-02-19 LAB — SALICYLATE LEVEL: Salicylate Lvl: <7 mg/dL — ABNORMAL LOW (ref 7.0–30.0)

## 2021-02-19 MED ORDER — LACTATED RINGERS IV SOLN: INTRAVENOUS | Status: DC

## 2021-02-19 MED ORDER — TRAZODONE HCL 50 MG PO TABS: 150.0000 mg | ORAL_TABLET | Freq: Every evening | ORAL | Status: DC | PRN

## 2021-02-19 MED ORDER — IOHEXOL 350 MG/ML SOLN
100.0000 mL | Freq: Once | INTRAVENOUS | Status: AC | PRN
  Administered 2021-02-19: 100 mL via INTRAVENOUS

## 2021-02-19 MED ORDER — INSULIN ASPART 100 UNIT/ML IJ SOLN: 0.0000 [IU] | Freq: Three times a day (TID) | INTRAMUSCULAR | Status: DC

## 2021-02-19 MED ORDER — MORPHINE SULFATE (PF) 4 MG/ML IV SOLN
6.0000 mg | Freq: Once | INTRAVENOUS | Status: AC
Start: 2021-02-19 — End: 2021-02-19
  Administered 2021-02-19: 6 mg via INTRAVENOUS
  Filled 2021-02-19: qty 2

## 2021-02-19 MED ORDER — DIPHENHYDRAMINE HCL 50 MG/ML IJ SOLN
25.0000 mg | Freq: Once | INTRAMUSCULAR | Status: AC
  Administered 2021-02-19: 25 mg via INTRAVENOUS
  Filled 2021-02-19: qty 1

## 2021-02-19 MED ORDER — ACETAMINOPHEN 650 MG RE SUPP: 650.0000 mg | Freq: Four times a day (QID) | RECTAL | Status: DC | PRN

## 2021-02-19 MED ORDER — MOMETASONE FURO-FORMOTEROL FUM 100-5 MCG/ACT IN AERO
2.0000 | INHALATION_SPRAY | Freq: Two times a day (BID) | RESPIRATORY_TRACT | Status: DC
  Administered 2021-02-20: 2 via RESPIRATORY_TRACT
  Filled 2021-02-19: qty 8.8

## 2021-02-19 MED ORDER — LABETALOL HCL 5 MG/ML IV SOLN: 10.0000 mg | INTRAVENOUS | Status: DC | PRN

## 2021-02-19 MED ORDER — UMECLIDINIUM BROMIDE 62.5 MCG/INH IN AEPB
1.0000 | INHALATION_SPRAY | Freq: Every day | RESPIRATORY_TRACT | Status: DC
  Administered 2021-02-20: 1 via RESPIRATORY_TRACT
  Filled 2021-02-19: qty 7

## 2021-02-19 MED ORDER — ASPIRIN 81 MG PO CHEW
81.0000 mg | CHEWABLE_TABLET | Freq: Every day | ORAL | Status: DC
  Administered 2021-02-20: 81 mg via ORAL
  Filled 2021-02-19: qty 1

## 2021-02-19 MED ORDER — BUDESON-GLYCOPYRROL-FORMOTEROL 160-9-4.8 MCG/ACT IN AERO: 2.0000 | INHALATION_SPRAY | Freq: Two times a day (BID) | RESPIRATORY_TRACT | Status: DC

## 2021-02-19 MED ORDER — PANTOPRAZOLE SODIUM 40 MG IV SOLR
40.0000 mg | Freq: Two times a day (BID) | INTRAVENOUS | Status: DC
  Administered 2021-02-19 – 2021-02-20 (×2): 40 mg via INTRAVENOUS
  Filled 2021-02-19 (×2): qty 40

## 2021-02-19 MED ORDER — DIVALPROEX SODIUM 500 MG PO DR TAB
500.0000 mg | DELAYED_RELEASE_TABLET | Freq: Two times a day (BID) | ORAL | Status: DC
  Administered 2021-02-19 – 2021-02-20 (×2): 500 mg via ORAL
  Filled 2021-02-19 (×3): qty 1

## 2021-02-19 MED ORDER — ALBUTEROL SULFATE HFA 108 (90 BASE) MCG/ACT IN AERS: 1.0000 | INHALATION_SPRAY | RESPIRATORY_TRACT | Status: DC | PRN

## 2021-02-19 MED ORDER — LIDOCAINE VISCOUS HCL 2 % MT SOLN
15.0000 mL | Freq: Once | OROMUCOSAL | Status: AC
  Administered 2021-02-19: 15 mL via ORAL
  Filled 2021-02-19: qty 15

## 2021-02-19 MED ORDER — AMLODIPINE BESYLATE 10 MG PO TABS
10.0000 mg | ORAL_TABLET | Freq: Every day | ORAL | Status: DC
  Administered 2021-02-19: 22:00:00 10 mg via ORAL
  Filled 2021-02-19: qty 1

## 2021-02-19 MED ORDER — ONDANSETRON HCL 4 MG/2ML IJ SOLN
4.0000 mg | Freq: Four times a day (QID) | INTRAMUSCULAR | Status: DC | PRN
  Administered 2021-02-19: 22:00:00 4 mg via INTRAVENOUS
  Filled 2021-02-19: qty 2

## 2021-02-19 MED ORDER — ISOSORBIDE MONONITRATE ER 30 MG PO TB24
60.0000 mg | ORAL_TABLET | Freq: Two times a day (BID) | ORAL | Status: DC
  Administered 2021-02-19 – 2021-02-20 (×2): 60 mg via ORAL
  Filled 2021-02-19 (×2): qty 2

## 2021-02-19 MED ORDER — HALOPERIDOL LACTATE 5 MG/ML IJ SOLN
2.5000 mg | Freq: Once | INTRAMUSCULAR | Status: AC
  Administered 2021-02-19: 2.5 mg via INTRAVENOUS
  Filled 2021-02-19: qty 1

## 2021-02-19 MED ORDER — ALBUTEROL SULFATE (2.5 MG/3ML) 0.083% IN NEBU: 2.5000 mg | INHALATION_SOLUTION | RESPIRATORY_TRACT | Status: DC | PRN

## 2021-02-19 MED ORDER — SODIUM CHLORIDE 0.9 % IV SOLN
12.5000 mg | Freq: Four times a day (QID) | INTRAVENOUS | Status: DC | PRN
  Filled 2021-02-19: qty 0.5

## 2021-02-19 MED ORDER — METOCLOPRAMIDE HCL 5 MG PO TABS
10.0000 mg | ORAL_TABLET | Freq: Three times a day (TID) | ORAL | Status: DC
  Administered 2021-02-19 – 2021-02-20 (×3): 10 mg via ORAL
  Filled 2021-02-19 (×3): qty 2

## 2021-02-19 MED ORDER — SODIUM CHLORIDE 0.9 % IV BOLUS
1000.0000 mL | Freq: Once | INTRAVENOUS | Status: AC
  Administered 2021-02-19: 1000 mL via INTRAVENOUS

## 2021-02-19 MED ORDER — CLOPIDOGREL BISULFATE 75 MG PO TABS
75.0000 mg | ORAL_TABLET | Freq: Every day | ORAL | Status: DC
  Administered 2021-02-20: 09:00:00 75 mg via ORAL
  Filled 2021-02-19: qty 1

## 2021-02-19 MED ORDER — METOCLOPRAMIDE HCL 5 MG/ML IJ SOLN
10.0000 mg | Freq: Once | INTRAMUSCULAR | Status: AC
  Administered 2021-02-19: 10 mg via INTRAVENOUS
  Filled 2021-02-19: qty 2

## 2021-02-19 MED ORDER — ACETAMINOPHEN 325 MG PO TABS: 650.0000 mg | ORAL_TABLET | Freq: Four times a day (QID) | ORAL | Status: DC | PRN

## 2021-02-19 MED ORDER — ONDANSETRON HCL 4 MG/2ML IJ SOLN
4.0000 mg | Freq: Once | INTRAMUSCULAR | Status: AC | PRN
  Administered 2021-02-19: 4 mg via INTRAVENOUS
  Filled 2021-02-19 (×3): qty 2

## 2021-02-19 MED ORDER — ALUM & MAG HYDROXIDE-SIMETH 200-200-20 MG/5ML PO SUSP
30.0000 mL | Freq: Once | ORAL | Status: AC
  Administered 2021-02-19: 30 mL via ORAL
  Filled 2021-02-19: qty 30

## 2021-02-19 MED ORDER — IOHEXOL 350 MG/ML SOLN: 100.0000 mL | Freq: Once | INTRAVENOUS | Status: DC | PRN

## 2021-02-19 MED ORDER — FENTANYL CITRATE (PF) 100 MCG/2ML IJ SOLN
25.0000 ug | INTRAMUSCULAR | Status: DC | PRN
  Administered 2021-02-19: 25 ug via INTRAVENOUS
  Filled 2021-02-19: qty 2

## 2021-02-19 NOTE — Telephone Encounter (Signed)
GI rec no Ozempic arianna inform  Dr. Marius Ditch she wants to switch to you

## 2021-02-19 NOTE — ED Notes (Signed)
Warm blankets provided. Patient appears more comfortable.

## 2021-02-19 NOTE — H&P (Addendum)
History and Physical    PLEASE NOTE THAT DRAGON DICTATION SOFTWARE WAS USED IN THE CONSTRUCTION OF THIS NOTE.   Shawna Anderson XTG:[id number] DOB: 02/03/1966 DOA: 02/19/2021  PCP: McLean-Scocuzza, Nino Glow, MD Patient coming from: home   I have personally briefly reviewed patient's old medical records in [place]  Chief Complaint: Nausea and vomiting  HPI: Shawna Anderson is a 55 y.o. female with medical history significant for type 2 diabetes mellitus complicated by gastroparesis, bipolar disorder, GERD, hypertension, chronic abdominal pain, who is admitted to [facility] on 02/19/2021 with intractable nausea/vomiting after presenting from home to [facility] ED complaining of nausea vomiting.   The patient reports 2 days of nausea resulting in her report of 8-9 episodes of nonbloody, nonbilious emesis over that timeframe.  As a consequence of this nausea/vomiting, she conveys that she has been able to keep nothing down by mouth, including no food or fluids, and has been unable to take her home oral medications over that timeframe as well.  In the context of a history of chronic abdominal pain, the patient reports slight increase in the intensity of her chronic abdominal pain over the last 2 days, but otherwise notes the pain to be of similar distribution and quality relative to that associated with her chronic abdominal discomfort.  Denies any recent preceding trauma.  Not associated with any diarrhea, melena, or hematochezia.  She describes the abdominal pain as sharp, nonradiating epigastric discomfort, that worsens with attempts to eat or drink as well as with palpation over the abdomen.  Denies any regular or recent alcohol consumption.  Acknowledges that she continues to smoke marijuana on a daily basis.  No recent subjective fever, chills, rigors, or generalized myalgias.  No recent travel.  Denies any recent headache, neck stiffness, rhinitis, rhinorrhea, sore  throat, shortness of breath, wheezing, cough, or rash.  No recent known COVID-19 exposures.  She also denies any recent dysuria, gross hematuria, or change in urinary urgency/frequency.  No recent chest pain, shortness of breath, diaphoresis, palpitations, dizziness, presyncope, or syncope.  Per chart review, patient has a history of type 2 diabetes mellitus that appears to be managed via lifestyle modifications, with the patient not currently on any insulin or oral hypoglycemic agents at home.  Most recent hemoglobin A1c appears to been 6.8% when checked on 10/12/2020.  She also has a documented history of diabetic gastroparesis, but does not appear to be on any Reglan.  She also denies any recent use of erythromycin.  The patient also acknowledges a history of GERD for which she reports good compliance with her home daily Protonix.  Chart review also reveals a history of diverticulitis.  Of note, on 02/11/2021, it appears that she underwent random a.m. cortisol check, which was found to be within normal limits and evaluating for any contribution from adrenal insufficiency.  She acknowledges recurrent visits to the emergency department for symptomatic management relating to the above nausea/vomiting and abdominal discomfort, and that typically she is discharged from the emergency department following improvement in symptomatic management, with most recent prior presentation to the emergency department noted on 02/11/2021.  However, in the setting persistence of her nausea/vomiting as well as abdominal discomfort in spite of multiple doses of antiemetics and multiple doses of analgesia in the ED today, the patient is being admitted for observation for symptomatic management relating to her presenting nausea/vomiting as well as abdominal discomfort.  The patient confirms that she has never previously taken Reglan or  erythromycin as intervention for her diabetic gastroparesis as an outpatient, but is amenable to  trying either these pharmacologic agents.      ED Course:  Vital signs in the ED were notable for the following: Temperature max 97.8, heart rate initially noted to be 127, with ensuing improvement to 107 following interval IV fluid administration, as further quantified below; blood pressure 178/82; respiratory rate 18-24, oxygen saturation 98 to 100% on room air.  Labs were notable for the following: CMP notable for the following: Sodium 139, potassium 3.7, bicarbonate 23, anion gap 18, creatinine 1.01 relative to most recent prior value of 0.84 on 02/11/2021, glucose 188, and liver enzymes were found to be within normal limits.  Lipase 60.  Initial lactic acid level 2.6, with repeat value trending down to 1.8 following interval IV fluids.  CBC notable for white blood cell count 18,000 relative to most recent prior value of 13.8 on 02/11/2021.  Urinalysis has been ordered, with result currently pending.  Screening nasopharyngeal COVID-19 PCR was performed in the ED today and found to be negative.  CT abdomen/pelvis with contrast showed low-density wall thickening of the distal esophagus, the may represent mild esophagitis, while also showing colonic diverticula without evidence of acute diverticulitis.  Otherwise, no evidence of acute intra-abdominal process, including no evidence of acute pancreatitis, no evidence of gallbladder wall thickening, pericholecystic fluid, cholelithiasis, choledocholithiasis, or dilation of common bile duct.  Furthermore, CT abdomen/pelvis showed no evidence of bowel obstruction, perforation, or abscess.  While in the ED, the following were administered: GI cocktail with viscous lidocaine x1 dose.  Benadryl 25 mg IV x1, Haldol 2.5 mg IV x2, Reglan 10 mg IV x1, morphine 4 mg IV x1, Zofran 4 mg IV x1 and a 1 L normal saline bolus.  Subsequently, the patient was admitted to the med telemetry floor for overnight observation for further evaluation and management presenting  intractable nausea/vomiting as well as acute on chronic epigastric pain.     Review of Systems: As per HPI otherwise 10 point review of systems negative.   Past Medical History:  Diagnosis Date   Anxiety    Asthma    Bipolar 1 disorder ([place])    Bipolar disorder ([place])    CAD (coronary artery disease)    s/p stent BMS OM Cx   Cervical herniated disc 04/12/2016   COPD (chronic obstructive pulmonary disease) (HCC)    Depression    Diabetes mellitus without complication (HCC)    Diverticulitis    GERD (gastroesophageal reflux disease)    Glaucoma    History of blood transfusion    Hyperlipidemia    Hypertension    OSA (obstructive sleep apnea)    not using cpap    Plantar fasciitis    b/l feet s/p steroid shots w/o help and left surgery w/o help    UTI (urinary tract infection)     Past Surgical History:  Procedure Laterality Date   ABDOMINAL HYSTERECTOMY     ABDOMINAL SURGERY  1995   Bowel resection.   CARDIAC CATHETERIZATION N/A 04/14/2016   Procedure: Left Heart Cath and Coronary Angiography;  Surgeon: Burnell Blanks, MD;  Location: [facility];  Service: Cardiovascular;  Laterality: N/A;   CARDIAC SURGERY     COLONOSCOPY WITH PROPOFOL N/A 07/11/2019   Procedure: COLONOSCOPY WITH PROPOFOL;  Surgeon: Jonathon Bellows, MD;  Location: [facility] ENDOSCOPY;  Service: Gastroenterology;  Laterality: N/A;   COLONOSCOPY WITH PROPOFOL N/A 07/29/2019   Procedure: COLONOSCOPY WITH PROPOFOL;  Surgeon: Jonathon Bellows, MD;  Location: [facility] ENDOSCOPY;  Service: Gastroenterology;  Laterality: N/A;   CORONARY ANGIOPLASTY WITH STENT PLACEMENT  2010   Drug eluting stent   ESOPHAGOGASTRODUODENOSCOPY (EGD) WITH PROPOFOL N/A 07/11/2019   Procedure: ESOPHAGOGASTRODUODENOSCOPY (EGD) WITH PROPOFOL;  Surgeon: Jonathon Bellows, MD;  Location: [facility] ENDOSCOPY;  Service: Gastroenterology;  Laterality: N/A;   LEFT HEART CATH AND CORONARY ANGIOGRAPHY Left 09/11/2020   Procedure: LEFT HEART CATH AND CORONARY  ANGIOGRAPHY;  Surgeon: Nelva Bush, MD;  Location: [place] [facility];  Service: Cardiovascular;  Laterality: Left;   OVARIAN CYST REMOVAL      Social History:  reports that she quit smoking about 7 weeks ago. Her smoking use included cigarettes. She has a 30.00 pack-year smoking history. She has never used smokeless tobacco. She reports current alcohol use. She reports current drug use. Frequency: 7.00 times per week. Drug: Marijuana.   Allergies  Allergen Reactions   Ativan [Lorazepam]     Pt states it makes her tongue do weird things    Augmentin [Amoxicillin-Pot Clavulanate]     Upset stomach    Latex Other (See Comments)    Patient stated that she was told by her doctor that she is "allergic to" this   Tape Other (See Comments)    Patient stated that she was told by her doctor that she is "allergic to" this   Xifaxan [Rifaximin]     Pt believes this is contributing to her abdominal pain/discomfort   Drixoral Allergy Sinus [Dexbromphen-Pse-Apap Er] Rash    Family History  Problem Relation Age of Onset   CAD Mother    Depression Mother    Heart disease Mother    Hyperlipidemia Mother    Hypertension Mother    Heart disease Father    Alcohol abuse Father    CAD Brother    Depression Brother    Diabetes Brother    Heart disease Brother    Hyperlipidemia Brother    Lupus Other    Sickle cell anemia Other     Family history reviewed and not pertinent   Prior to Admission medications   Medication Sig Start Date End Date Taking? Authorizing Provider  albuterol (PROAIR HFA) 108 (90 Base) MCG/ACT inhaler Inhale 1-2 puffs into the lungs every 4 (four) hours as needed for wheezing or shortness of breath. 01/28/21   McLean-Scocuzza, Nino Glow, MD  amLODipine (NORVASC) 10 MG tablet Take 1 tablet (10 mg total) by mouth at bedtime. 04/16/20   McLean-Scocuzza, Nino Glow, MD  aspirin 81 MG chewable tablet Chew 81 mg by mouth daily.    [provider]  atorvastatin  (LIPITOR) 80 MG tablet Take 1 tablet (80 mg total) by mouth daily at 6 PM. 04/16/20   McLean-Scocuzza, Nino Glow, MD  Budeson-Glycopyrrol-Formoterol (BREZTRI AEROSPHERE) 160-9-4.8 MCG/ACT AERO Inhale 2 puffs into the lungs in the morning and at bedtime. Rinse 12/05/20   McLean-Scocuzza, Nino Glow, MD  clopidogrel (PLAVIX) 75 MG tablet Take 1 tablet (75 mg total) by mouth daily. 04/16/20   McLean-Scocuzza, Nino Glow, MD  divalproex (DEPAKOTE) 500 MG DR tablet TAKE 1 TABLET BY MOUTH  TWICE DAILY 08/16/20   McLean-Scocuzza, Nino Glow, MD  ezetimibe (ZETIA) 10 MG tablet TAKE 1 TABLET BY MOUTH  DAILY WITH LIPITOR 80 MG AT NIGHT 11/26/20   McLean-Scocuzza, Nino Glow, MD  icosapent Ethyl (VASCEPA) 1 g capsule Take 2 capsules (2 g total) by mouth 2 (two) times daily. 01/01/21   Pavero, Harrell Gave, RPH  isosorbide mononitrate (  IMDUR) 60 MG 24 hr tablet Take 1 tablet (60 mg total) by mouth in the morning and at bedtime. 01/31/21 01/26/22  End, Harrell Gave, MD  methocarbamol (ROBAXIN) 500 MG tablet Take 1 tablet (500 mg total) by mouth 2 (two) times daily as needed for muscle spasms. 02/07/21   McLean-Scocuzza, Nino Glow, MD  nitroGLYCERIN (NITROSTAT) 0.4 MG SL tablet DISSOLVE 1 TABLET UNDER  TONGUE EVERY 5 MINUTES AS  NEEDED FOR CHEST PAIN MAX  OF 3 TABLETS IN 15 MINUTES  . CALL 911 AFTER THIRD DOSE 11/26/20   Loel Dubonnet, NP  olmesartan-hydrochlorothiazide (BENICAR HCT) 20-12.5 MG tablet Take 0.5 tablets by mouth daily. If  BP >130/>80 may increase to 1 pill qam. Stop 40-25 mg dose 02/10/21   McLean-Scocuzza, Nino Glow, MD  pantoprazole (PROTONIX) 40 MG tablet TAKE 1 TABLET BY MOUTH  TWICE DAILY BEFORE MEALS 01/31/20   Jonathon Bellows, MD  traMADol (ULTRAM) 50 MG tablet Take 1 tablet (50 mg total) by mouth every 6 (six) hours as needed for moderate pain. 02/12/21   Fritzi Mandes, MD  traZODone (DESYREL) 150 MG tablet Take 1 tablet (150 mg total) by mouth at bedtime as needed for sleep. 04/16/20   McLean-Scocuzza, Nino Glow, MD  Vitamin D,  Cholecalciferol, 25 MCG (1000 UT) CAPS Take 1 capsule by mouth daily.    [provider]     Objective    Physical Exam: Vitals:   02/19/21 1442 02/19/21 1626 02/19/21 1811 02/19/21 1822  BP: (!) 195/90 (!) 185/92 (!) 186/90 (!) 178/82  Pulse: (!) 121 (!) 126 (!) 113 (!) 107  Resp: 20 (!) 24 (!) 22 18  Temp: 97.8 F (36.6 C) 97.8 F (36.6 C) 97.8 F (36.6 C) 97.8 F (36.6 C)  TempSrc: Oral Oral Oral Oral  SpO2: 99% 99% 98% 99%  Weight:      Height:        General: appears to be stated age; alert, oriented Skin: warm, dry, no rash Head:  AT/[place] Mouth:  Oral mucosa membranes appear dry, normal dentition Neck: supple; trachea midline Heart:  RRR; did not appreciate any M/R/G Lungs: CTAB, did not appreciate any wheezes, rales, or rhonchi Abdomen: + BS; soft, ND, mild tenderness over the epigastrium in the absence of any associated guarding, rigidity, or rebound tenderness Vascular: 2+ pedal pulses b/l; 2+ radial pulses b/l Extremities: no peripheral edema, no muscle wasting Neuro: strength and sensation intact in upper and lower extremities b/l    Labs on Admission: I have personally reviewed following labs and imaging studies  CBC: Recent Labs  Lab 02/19/21 1252  WBC 18.0*  HGB 13.8  HCT 39.4  MCV 87.4  PLT 132*   Basic Metabolic Panel: Recent Labs  Lab 02/19/21 1252  NA 139  K 3.7  CL 98  CO2 23  GLUCOSE 180*  BUN 15  CREATININE 1.01*  CALCIUM 9.8  MG 2.1   GFR: Estimated Creatinine Clearance: 66.9 mL/min (A) (by C-G formula based on SCr of 1.01 mg/dL (H)). Liver Function Tests: Recent Labs  Lab 02/19/21 1252  AST 30  ALT 26  ALKPHOS 60  BILITOT 0.8  PROT 7.7  ALBUMIN 4.5   Recent Labs  Lab 02/19/21 1252  LIPASE 60*   No results for input(s): AMMONIA in the last 168 hours. Coagulation Profile: No results for input(s): INR, PROTIME in the last 168 hours. Cardiac Enzymes: No results for input(s): CKTOTAL, CKMB, CKMBINDEX,  TROPONINI in the last 168 hours. BNP (last 3  results) No results for input(s): PROBNP in the last 8760 hours. HbA1C: No results for input(s): HGBA1C in the last 72 hours. CBG: No results for input(s): GLUCAP in the last 168 hours. Lipid Profile: No results for input(s): CHOL, HDL, LDLCALC, TRIG, CHOLHDL, LDLDIRECT in the last 72 hours. Thyroid Function Tests: No results for input(s): TSH, T4TOTAL, FREET4, T3FREE, THYROIDAB in the last 72 hours. Anemia Panel: No results for input(s): VITAMINB12, FOLATE, FERRITIN, TIBC, IRON, RETICCTPCT in the last 72 hours. Urine analysis:    Component Value Date/Time   COLORURINE YELLOW (A) 02/10/2021 1551   APPEARANCEUR CLEAR (A) 02/10/2021 1551   APPEARANCEUR Turbid (A) 12/31/2017 0912   LABSPEC >1.046 (H) 02/10/2021 1551   LABSPEC 1.028 10/26/2014 1717   PHURINE 6.0 02/10/2021 1551   GLUCOSEU NEGATIVE 02/10/2021 1551   GLUCOSEU Negative 10/26/2014 1717   HGBUR MODERATE (A) 02/10/2021 1551   BILIRUBINUR NEGATIVE 02/10/2021 1551   BILIRUBINUR Negative 12/31/2017 0912   BILIRUBINUR Negative 10/26/2014 1717   KETONESUR 20 (A) 02/10/2021 1551   PROTEINUR 30 (A) 02/10/2021 1551   NITRITE NEGATIVE 02/10/2021 1551   LEUKOCYTESUR NEGATIVE 02/10/2021 1551   LEUKOCYTESUR Negative 10/26/2014 1717    Radiological Exams on Admission: CT ABDOMEN PELVIS W CONTRAST  Result Date: 02/19/2021 CLINICAL DATA:  Persistent abdominal pain nausea and vomiting. EXAM: CT ABDOMEN AND PELVIS WITH CONTRAST TECHNIQUE: Multidetector CT imaging of the abdomen and pelvis was performed using the standard protocol following bolus administration of intravenous contrast. CONTRAST:  178m OMNIPAQUE IOHEXOL 350 MG/ML SOLN COMPARISON:  CT February 10, 2021 FINDINGS: Despite efforts by the technologist and patient, motion artifact is present on today's exam and could not be eliminated. This reduces exam sensitivity and specificity. Lower chest: No acute abnormality. Hepatobiliary: No focal  liver abnormality is seen. No gallstones, gallbladder wall thickening, or biliary dilatation. Pancreas: Unremarkable. No pancreatic ductal dilatation or surrounding inflammatory changes. Spleen: Normal in size without focal abnormality. Adrenals/Urinary Tract: Adrenal glands are unremarkable. Kidneys are normal, without renal calculi, solid enhancing lesion, or hydronephrosis. Bladder is unremarkable for degree of distension. Stomach/Bowel: Low-density wall thickening of the distal esophagus extending to the GE junction. Stomach is unremarkable for degree of distension. No pathologic dilation of small bowel. There are few scattered colonic diverticula without findings of acute diverticulitis. Sigmoid colonic anastomotic sutures. Vascular/Lymphatic: Aortic atherosclerosis without aneurysmal dilation. No pathologically enlarged abdominal or pelvic lymph nodes. Reproductive: Status post hysterectomy. No adnexal masses. Other: No abdominopelvic ascites. Musculoskeletal: Mild lower lumbar spondylosis. No acute osseous abnormality. IMPRESSION: 1. Low-density wall thickening of the distal esophagus extending to the GE junction may represent esophagitis. 2. Scattered colonic diverticula without findings of acute diverticulitis. 3.  Aortic Atherosclerosis (ICD10-I70.0). Electronically Signed   By: JDahlia BailiffMD   On: 02/19/2021 15:44      Assessment/Plan   Shawna LAGUARDIAis a 55y.o. female with medical history significant for type 2 diabetes mellitus complicated by gastroparesis, bipolar disorder, GERD, hypertension, chronic abdominal pain, [facility] with int[facility] nausea[facility]vomiting [facility]    Principal Problem:   Intractable nausea and vomiting Active Problems:   Essential hypertension   Hyperlipidemia associated with type 2 diabetes mellitus (HCC)   Gastroesophageal reflux disease    Epigastric pain   SIRS (systemic inflammatory response syndrome) (HCC)   High anion gap metabolic acidosis   Elevated serum creatinine   Diabetes mellitus [place] complication ([place]      #)  Intractable nausea and vomiting: 2 days of near constant nausea resulting in approximately 5 daily episodes of nonbloody, nonbilious emesis, with persistent nausea in spite of this is multiple antiemetics administered in the ED today, including that of Haldol, Reglan, and Zofran.  Appears associated with her presenting epigastric pain, with differential for this presentation appearing to favor diabetic gastroparesis versus hyperemesis cannabinoid syndrome in the context of the patient's reported daily marijuana smoking.  Aside from the possibility of mild distal esophagitis, likely has a consequence of her recent nausea/vomiting as opposed to a causative factor leading to her nausea/vomiting, presenting CT abdomen/pelvis shows no evidence of acute intra-abdominal process, including no evidence of acute pancreatitis, acute cholecystitis, or bowel obstruction, abscess, or perforation.  Additionally, CT abdomen/pelvis shows no evidence of diverticulitis, which is notable given that she has a documented prior history of such.  Physical exam is associated with no evidence of peritoneal signs.  Given suspicion for contribution from diabetic gastroparesis, will start scheduled Reglan before every meal and at bedtime, as the patient reports that she is never used this medication as treatment of her diabetic gastroparesis.  Of note, presenting COVID-19 PCR was found to be negative.  Plan: Reglan 10 mg p.o. before every meal and at bedtime, to be given 30 minutes prior to meals, as above.  As needed Zofran.  As needed Phenergan for nausea refractory to as needed Zofran.  Monitor strict I's and O's and daily weights.  Add on serum magnesium level.  Continuous IV fluids.  Counseled the patient on the importance of reduction of  use of marijuana, with goal of complete discontinuation.  Check hemoglobin A1c to assess degree of hyperglycemic control as an outpatient.  Check EKG, TSH, urinalysis, and urinary drug screen.  Repeat CMP in the morning.  Repeat CBC in the morning.  As needed fentanyl for epigastric discomfort.       #) Hypertension: Documented history of such, with the patient conveying that she has been unable to take her home antihypertensives over the last few days in the setting of her ongoing nausea/vomiting.  Presenting blood pressure noted to be in the 170s, without any associated acute focal neurologic deficits.  We will resume home antihypertensive medications while initiating treatment of suspected diabetic gastroparesis as well as while providing symptomatic management of associated nausea/vomiting.  Plan: Resume home antihypertensive medications.  Close monitoring of ensuing blood pressure via routine vital signs.  As needed IV labetalol for systolic blood pressure greater than 180.       #) Positive SIRS criteria: Leukocytosis as well as tachycardia are present at time of presentation to ED.  I suspect that both of these abnormalities are on the basis of dehydration as a consequence of recent nausea/vomiting and limited oral intake, with presenting leukocytosis subject to hemoconcentration as evidenced by concomitant elevation in platelet count.  Furthermore, suspected dehydration contribution appears supported by improving tachycardia with IV fluids, as further quantified above.  Overall, no evidence of underlying infectious process at this time, including negative presenting COVID-19/influenza PCR.  In the absence of any evidence of underlying infectious process at this time, criteria for sepsis are not met, and will refrain from initiation of antibiotics.  Plan: Continuous IV fluids, as above.  Check urinalysis.  Check chest x-ray.  Repeat CBC with differential in the morning.  Monitor strict I's  and O's.      #) Anion gap metabolic acidosis: Appears to be on the basis of starvation keto lactic acidosis in  the setting of very limited oral intake over the last 2 to 3 days due to associated nausea/vomiting over that timeframe, with presenting lactate mildly elevated 2.6, with interval improvement to 1.8 following administration of IV fluids.  No evidence of underlying infectious contribution at this time, and presentation does not appear consistent with sepsis, as further detailed above.  Plan: Continue IV fluids, as above.  Check urinalysis, chest x-ray.  We will also check salicylate level.  Repeat CMP in the morning.       #) Type 2 diabetes mellitus: Documented history of such, most recent hemoglobin A1c noted to be 6.8% when checked on 10/12/2020.  Appears complicated by documentation of diabetic gastroparesis.  Patient conveys that her diabetes is managed via lifestyle modifications, and that she is not currently on any oral hypoglycemic agents nor any insulin as an outpatient.  Presenting blood sugar of 180, likely with hyperglycemic influence from dehydration as well as physiologic stress stemming from recent nausea/vomiting.  However, will check hemoglobin A1c to evaluate updated degree of hyperglycemic control particularly in the setting of suspected presenting gastroparesis as he modifiable influence over this process.  Plan: Check hemoglobin A1c, as above.  Accu-Cheks before every meal and at bedtime with sliding scale insulin.  Initiate oral Reglan for gastroparesis, as further detailed above.      #) GERD: On oral Protonix on a twice daily basis at home.  In the setting of recurrent nausea/vomiting, will temporarily convert her Protonix to IV form.   Plan: Hold home oral Protonix for now in favor of twice daily dosing of IV administration.     DVT prophylaxis: SCDs Code Status: Full code Family Communication: none Disposition Plan: Per Rounding Team Consults  called: none;  Admission status: Observation; MedSurg     Of note, this patient was added by me to the following Admit List/Treatment Team: armcadmits.      PLEASE NOTE THAT DRAGON DICTATION SOFTWARE WAS USED IN THE CONSTRUCTION OF THIS NOTE.   [facility] Pager [phone] From [place]  Otherwise, please contact night-coverage  [email or web address] Password [facility]   02/19/2021, 7:33 PM

## 2021-02-19 NOTE — ED Notes (Signed)
Patient requesting additional medication for pain and nausea.  Orders received.   Patient told CT tech she was unable to go to CT until she had medication for pain/nausea.

## 2021-02-19 NOTE — ED Provider Notes (Signed)
[facility] Emergency Department Provider Note  ____________________________________________   Event Date/Time   First MD Initiated Contact with Patient 02/19/21 1306     (approximate)  I have reviewed the triage vital signs and the nursing notes.   HISTORY  Chief Complaint Abdominal Pain    HPI Shawna Anderson is a 55 y.o. female with past medical history of bipolar disorder, hypertension, hyperlipidemia, multiple recent admissions for diverticulitis, here with abdominal pain.  The patient just returned home several days ago.  She states that since then, she has had ongoing, worsening, nausea, vomiting.  She reports she has been unable to eat or drink.  She describes generalized, but primarily lower abdominal pain.  The pain is aching and gnawing.  Is 10 of 10 in severity.  Denies any known fevers but has had some chills.  Pain feels similar to her recent admissions.  She also has a history of gastroparesis per report.  She also has chronic nausea.  She has been taking Zofran without significant relief.    Past Medical History:  Diagnosis Date   Anxiety    Asthma    Bipolar 1 disorder ([place])    Bipolar disorder ([place])    CAD (coronary artery disease)    s/p stent BMS OM Cx   Cervical herniated disc 04/12/2016   COPD (chronic obstructive pulmonary disease) (HCC)    Depression    Diabetes mellitus without complication (HCC)    Diverticulitis    GERD (gastroesophageal reflux disease)    Glaucoma    History of blood transfusion    Hyperlipidemia    Hypertension    OSA (obstructive sleep apnea)    not using cpap    Plantar fasciitis    b/l feet s/p steroid shots w/o help and left surgery w/o help    UTI (urinary tract infection)     Patient Active Problem List   Diagnosis Date Noted   Intractable nausea and vomiting 02/19/2021   Sepsis ([place]) 02/10/2021   Hypertension associated with diabetes ([place]) 01/23/2021   Obesity (BMI 30-39.9)  01/23/2021   Gastroparesis    Diverticulitis 01/01/2021   AKI (acute kidney injury) ([place]) 12/31/2020   Hypokalemia 12/31/2020   Transaminitis 12/31/2020   Acute diverticulitis 12/17/2020   Chronic pain syndrome 12/05/2020   Acute bronchitis with COPD ([place]) 11/28/2020   Cough 11/28/2020   Accelerating angina (HCC) 09/11/2020   Chronic nausea 03/26/2020   SUI (stress urinary incontinence, female) 03/26/2020   Bronchitis 03/26/2020   Continuous LLQ abdominal pain 03/01/2020   URI with cough and congestion 03/01/2020   Esophageal dysphagia 01/26/2020   Schatzki's ring of distal esophagus 01/26/2020   Ecchymosis 01/11/2020   Chronic pain of both knees 01/11/2020   Bruising 12/05/2019   Trichomonas infection 12/05/2019   Elevated liver enzymes 11/15/2019   Prediabetes 11/15/2019   STD exposure 11/15/2019   Anemia 11/15/2019   Chronic bursitis of right shoulder 10/25/2019   Chest wall pain 09/05/2019   Poor dentition 08/18/2019   Thrombocytosis 08/18/2019   Leukocytosis 08/18/2019   Anal lesion 08/18/2019   Female pelvic pain 05/26/2019   Colitis 05/26/2019   HLD (hyperlipidemia) 12/27/2018   Vitamin D deficiency 12/24/2018   Bipolar disorder ([place]) 12/30/2017   OSA (obstructive sleep apnea) 12/30/2017   Constipation 12/30/2017   Coronary artery disease of native artery of native heart with stable angina pectoris ([place]) 12/30/2017   Gastroesophageal reflux disease 12/30/2017   Asthma 12/29/2017   COPD (chronic obstructive  pulmonary disease) ([place]) 12/29/2017   Anxiety and depression 12/29/2017   Insomnia 12/29/2017   Chronic back pain 12/29/2017   Skin lesion of back 12/29/2017   CAD S/P CFX PCI 2010 04/15/2016   Essential hypertension 04/15/2016   Hyperlipidemia associated with type 2 diabetes mellitus ([place]) 04/15/2016   Noncompliance with medication regimen 04/15/2016   Chest pain with moderate risk of acute coronary syndrome 04/12/2016   Chronic abdominal pain 01/12/2015    Vomiting 01/12/2015   Nausea and vomiting 01/12/2015    Past Surgical History:  Procedure Laterality Date   ABDOMINAL HYSTERECTOMY     ABDOMINAL SURGERY  1995   Bowel resection.   CARDIAC CATHETERIZATION N/A 04/14/2016   Procedure: Left Heart Cath and Coronary Angiography;  Surgeon: Burnell Blanks, MD;  Location: [facility];  Service: Cardiovascular;  Laterality: N/A;   CARDIAC SURGERY     COLONOSCOPY WITH PROPOFOL N/A 07/11/2019   Procedure: COLONOSCOPY WITH PROPOFOL;  Surgeon: Jonathon Bellows, MD;  Location: [facility] ENDOSCOPY;  Service: Gastroenterology;  Laterality: N/A;   COLONOSCOPY WITH PROPOFOL N/A 07/29/2019   Procedure: COLONOSCOPY WITH PROPOFOL;  Surgeon: Jonathon Bellows, MD;  Location: [facility] ENDOSCOPY;  Service: Gastroenterology;  Laterality: N/A;   CORONARY ANGIOPLASTY WITH STENT PLACEMENT  2010   Drug eluting stent   ESOPHAGOGASTRODUODENOSCOPY (EGD) WITH PROPOFOL N/A 07/11/2019   Procedure: ESOPHAGOGASTRODUODENOSCOPY (EGD) WITH PROPOFOL;  Surgeon: Jonathon Bellows, MD;  Location: [facility] ENDOSCOPY;  Service: Gastroenterology;  Laterality: N/A;   LEFT HEART CATH AND CORONARY ANGIOGRAPHY Left 09/11/2020   Procedure: LEFT HEART CATH AND CORONARY ANGIOGRAPHY;  Surgeon: Nelva Bush, MD;  Location: [facility];  Service: Cardiovascular;  Laterality: Left;   OVARIAN CYST REMOVAL      Prior to Admission medications   Medication Sig Start Date End Date Taking? Authorizing Provider  albuterol (PROAIR HFA) 108 (90 Base) MCG/ACT inhaler Inhale 1-2 puffs into the lungs every 4 (four) hours as needed for wheezing or shortness of breath. 01/28/21   McLean-Scocuzza, Nino Glow, MD  amLODipine (NORVASC) 10 MG tablet Take 1 tablet (10 mg total) by mouth at bedtime. 04/16/20   McLean-Scocuzza, Nino Glow, MD  aspirin 81 MG chewable tablet Chew 81 mg by mouth daily.    [provider]  atorvastatin (LIPITOR) 80 MG tablet Take 1 tablet (80 mg total) by mouth daily at 6 PM. 04/16/20    McLean-Scocuzza, Nino Glow, MD  Budeson-Glycopyrrol-Formoterol (BREZTRI AEROSPHERE) 160-9-4.8 MCG/ACT AERO Inhale 2 puffs into the lungs in the morning and at bedtime. Rinse 12/05/20   McLean-Scocuzza, Nino Glow, MD  clopidogrel (PLAVIX) 75 MG tablet Take 1 tablet (75 mg total) by mouth daily. 04/16/20   McLean-Scocuzza, Nino Glow, MD  divalproex (DEPAKOTE) 500 MG DR tablet TAKE 1 TABLET BY MOUTH  TWICE DAILY 08/16/20   McLean-Scocuzza, Nino Glow, MD  ezetimibe (ZETIA) 10 MG tablet TAKE 1 TABLET BY MOUTH  DAILY WITH LIPITOR 80 MG AT NIGHT 11/26/20   McLean-Scocuzza, Nino Glow, MD  icosapent Ethyl (VASCEPA) 1 g capsule Take 2 capsules (2 g total) by mouth 2 (two) times daily. 01/01/21   Pavero, Harrell Gave, RPH  isosorbide mononitrate (IMDUR) 60 MG 24 hr tablet Take 1 tablet (60 mg total) by mouth in the morning and at bedtime. 01/31/21 01/26/22  End, Harrell Gave, MD  methocarbamol (ROBAXIN) 500 MG tablet Take 1 tablet (500 mg total) by mouth 2 (two) times daily as needed for muscle spasms. 02/07/21   McLean-Scocuzza, Nino Glow, MD  nitroGLYCERIN (NITROSTAT) 0.4 MG  SL tablet DISSOLVE 1 TABLET UNDER  TONGUE EVERY 5 MINUTES AS  NEEDED FOR CHEST PAIN MAX  OF 3 TABLETS IN 15 MINUTES  . CALL 911 AFTER THIRD DOSE 11/26/20   Loel Dubonnet, NP  olmesartan-hydrochlorothiazide (BENICAR HCT) 20-12.5 MG tablet Take 0.5 tablets by mouth daily. If  BP >130/>80 may increase to 1 pill qam. Stop 40-25 mg dose 02/10/21   McLean-Scocuzza, Nino Glow, MD  pantoprazole (PROTONIX) 40 MG tablet TAKE 1 TABLET BY MOUTH  TWICE DAILY BEFORE MEALS 01/31/20   Jonathon Bellows, MD  traMADol (ULTRAM) 50 MG tablet Take 1 tablet (50 mg total) by mouth every 6 (six) hours as needed for moderate pain. 02/12/21   Fritzi Mandes, MD  traZODone (DESYREL) 150 MG tablet Take 1 tablet (150 mg total) by mouth at bedtime as needed for sleep. 04/16/20   McLean-Scocuzza, Nino Glow, MD  Vitamin D, Cholecalciferol, 25 MCG (1000 UT) CAPS Take 1 capsule by mouth daily.    [provider]    Allergies Ativan [lorazepam], Augmentin [amoxicillin-pot clavulanate], Latex, Tape, Xifaxan [rifaximin], and Drixoral allergy sinus [dexbromphen-pse-apap er]  Family History  Problem Relation Age of Onset   CAD Mother    Depression Mother    Heart disease Mother    Hyperlipidemia Mother    Hypertension Mother    Heart disease Father    Alcohol abuse Father    CAD Brother    Depression Brother    Diabetes Brother    Heart disease Brother    Hyperlipidemia Brother    Lupus Other    Sickle cell anemia Other     Social History Social History   Tobacco Use   Smoking status: Former    Packs/day: 1.00    Years: 30.00    Pack years: 30.00    Types: Cigarettes    Quit date: 12/31/2020    Years since quitting: 0.1   Smokeless tobacco: Never   Tobacco comments:    Quit after being admitted on 6/20 01/17/2021  Vaping Use   Vaping Use: Never used  Substance Use Topics   Alcohol use: Yes    Comment: once a year   Drug use: Yes    Frequency: 7.0 times per week    Types: Marijuana    Comment: 1 per day    Review of Systems  Review of Systems  Constitutional:  Negative for fatigue and fever.  HENT:  Negative for congestion and sore throat.   Eyes:  Negative for visual disturbance.  Respiratory:  Negative for cough and shortness of breath.   Cardiovascular:  Negative for chest pain.  Gastrointestinal:  Positive for abdominal pain, nausea and vomiting. Negative for diarrhea.  Genitourinary:  Negative for flank pain.  Musculoskeletal:  Negative for back pain and neck pain.  Skin:  Negative for rash and wound.  Neurological:  Negative for weakness.  All other systems reviewed and are negative.   ____________________________________________  PHYSICAL EXAM:      VITAL SIGNS: ED Triage Vitals  Enc Vitals Group     BP 02/19/21 1258 (!) 187/123     Pulse Rate 02/19/21 1258 (!) 127     Resp 02/19/21 1258 (!) 25     Temp --      Temp src --      SpO2  02/19/21 1258 100 %     Weight 02/19/21 1249 189 lb 9.5 oz (86 kg)     Height 02/19/21 1249 '5\' 3"'$  (1.6 m)  Head Circumference --      Peak Flow --      Pain Score 02/19/21 1249 9     Pain Loc --      Pain Edu? --      Excl. in [place]? --      Physical Exam Vitals and nursing note reviewed.  Constitutional:      General: She is not in acute distress.    Appearance: She is well-developed.  HENT:     Head: Normocephalic and atraumatic.  Eyes:     Conjunctiva/sclera: Conjunctivae normal.  Cardiovascular:     Rate and Rhythm: Normal rate and regular rhythm.     Heart sounds: Normal heart sounds. No murmur heard.   No friction rub.  Pulmonary:     Effort: Pulmonary effort is normal. No respiratory distress.     Breath sounds: Normal breath sounds. No wheezing or rales.  Abdominal:     General: There is no distension.     Palpations: Abdomen is soft.     Tenderness: There is generalized abdominal tenderness and tenderness in the suprapubic area.  Musculoskeletal:     Cervical back: Neck supple.  Skin:    General: Skin is warm.     Capillary Refill: Capillary refill takes less than 2 seconds.  Neurological:     Mental Status: She is alert and oriented to person, place, and time.     Motor: No abnormal muscle tone.      ____________________________________________   LABS (all labs ordered are listed, but only abnormal results are displayed)  Labs Reviewed  LIPASE, BLOOD - Abnormal; Notable for the following components:      Result Value   Lipase 60 (*)    All other components within normal limits  COMPREHENSIVE METABOLIC PANEL - Abnormal; Notable for the following components:   Glucose, Bld 180 (*)    Creatinine, Ser 1.01 (*)    Anion gap 18 (*)    All other components within normal limits  CBC - Abnormal; Notable for the following components:   WBC 18.0 (*)    Platelets 569 (*)    All other components within normal limits  LACTIC ACID, PLASMA - Abnormal; Notable  for the following components:   Lactic Acid, Venous <0.3 (*)    All other components within normal limits  LACTIC ACID, PLASMA - Abnormal; Notable for the following components:   Lactic Acid, Venous 2.6 (*)    All other components within normal limits  RESP PANEL BY RT-PCR (FLU A&B, COVID) ARPGX2  LACTIC ACID, PLASMA  URINALYSIS, COMPLETE (UACMP) WITH MICROSCOPIC  MAGNESIUM  MAGNESIUM  COMPREHENSIVE METABOLIC PANEL  CBC WITH DIFFERENTIAL/PLATELET    ____________________________________________  EKG:  ________________________________________  RADIOLOGY All imaging, including plain films, CT scans, and ultrasounds, independently reviewed by me, and interpretations confirmed via formal radiology reads.  ED MD interpretation:   CT abdomen/pelvis:Pending  Official radiology report(s): CT ABDOMEN PELVIS W CONTRAST  Result Date: 02/19/2021 CLINICAL DATA:  Persistent abdominal pain nausea and vomiting. EXAM: CT ABDOMEN AND PELVIS WITH CONTRAST TECHNIQUE: Multidetector CT imaging of the abdomen and pelvis was performed using the standard protocol following bolus administration of intravenous contrast. CONTRAST:  166m OMNIPAQUE IOHEXOL 350 MG/ML SOLN COMPARISON:  CT February 10, 2021 FINDINGS: Despite efforts by the technologist and patient, motion artifact is present on today's exam and could not be eliminated. This reduces exam sensitivity and specificity. Lower chest: No acute abnormality. Hepatobiliary: No focal liver abnormality is seen. No gallstones,  gallbladder wall thickening, or biliary dilatation. Pancreas: Unremarkable. No pancreatic ductal dilatation or surrounding inflammatory changes. Spleen: Normal in size without focal abnormality. Adrenals/Urinary Tract: Adrenal glands are unremarkable. Kidneys are normal, without renal calculi, solid enhancing lesion, or hydronephrosis. Bladder is unremarkable for degree of distension. Stomach/Bowel: Low-density wall thickening of the distal  esophagus extending to the GE junction. Stomach is unremarkable for degree of distension. No pathologic dilation of small bowel. There are few scattered colonic diverticula without findings of acute diverticulitis. Sigmoid colonic anastomotic sutures. Vascular/Lymphatic: Aortic atherosclerosis without aneurysmal dilation. No pathologically enlarged abdominal or pelvic lymph nodes. Reproductive: Status post hysterectomy. No adnexal masses. Other: No abdominopelvic ascites. Musculoskeletal: Mild lower lumbar spondylosis. No acute osseous abnormality. IMPRESSION: 1. Low-density wall thickening of the distal esophagus extending to the GE junction may represent esophagitis. 2. Scattered colonic diverticula without findings of acute diverticulitis. 3.  Aortic Atherosclerosis (ICD10-I70.0). Electronically Signed   By: Dahlia Bailiff MD   On: 02/19/2021 15:44    ____________________________________________  PROCEDURES   Procedure(s) performed (including Critical Care):  Procedures  ____________________________________________  INITIAL IMPRESSION / MDM / [place] / ED COURSE  As part of my medical decision making, I reviewed the following data within the [place] notes reviewed and incorporated, Old chart reviewed, Notes from prior ED visits, and  Controlled Substance [place] was evaluated in Emergency Department on 02/19/2021 for the symptoms described in the history of present illness. She was evaluated in the context of the global COVID-19 pandemic, which necessitated consideration that the patient might be at risk for infection with the SARS-CoV-2 virus that causes COVID-19. Institutional protocols and algorithms that pertain to the evaluation of patients at risk for COVID-19 are in a state of rapid change based on information released by regulatory bodies including the CDC and federal and state organizations. These policies and  algorithms were followed during the patient's care in the ED.  Some ED evaluations and interventions may be delayed as a result of limited staffing during the pandemic.*  Clinical Course as of 02/19/21 1859  Tue Feb 19, 2021  1559 Patient received in signout from Dr. Ellender Hose pending CT scan and reassessment.  CT noted without evidence of acute intra-abdominal pathology.  No evidence of diverticulitis. [DS]  [id number] I reassessed the patient and she is comfortably asleep, and awakens to vocal stimulation.  Abdominal exam is soft and benign, but as she wakens up and we discussed her symptoms and work-up, she hunches over and starts rocking in bed, complaining of abdominal pain.  We discussed benign CT and the possibility of outpatient management with GI follow-up.  She is hesitant and expresses some discomfort with this.  We discussed additional medications, p.o. challenge and redraw of lactic acid which unexpectedly increased in the setting of fluid resuscitation and initial normal value.  She is in agreement with plan of care. She disconnects her self from all monitors and IV site, and ambulates herself to the toilet to void without difficulty. [DS]  [id number].  Still tachycardic, uncomfortable and requesting admission. [DS]    Clinical Course User Index [DS] Vladimir Crofts, MD    Medical Decision Making: 55 year old female here with abdominal pain, nausea, vomiting.  Patient has multiple recent admissions for same with recurrent diverticulitis.  Differential includes ongoing diverticulitis, perforation, obstruction, acute gastroparesis/cyclical vomiting exacerbation.  Patient has moderate leukocytosis.  Patient will be given IV fluids, CT scan pending.  Lipase slightly elevated but suspect this is reactive to her vomiting.  Lactic acid is negative which is reassuring.  She does not appear septic.  Urinalysis pending.  Discussed with son, who feels patient will likely need admission and states she has  been vomiting essentially nonstop since her recent discharge.  ____________________________________________  FINAL CLINICAL IMPRESSION(S) / ED DIAGNOSES  Final diagnoses:  Intractable nausea and vomiting  Sinus tachycardia     MEDICATIONS GIVEN DURING THIS VISIT:  Medications  acetaminophen (TYLENOL) tablet 650 mg (has no administration in time range)    Or  acetaminophen (TYLENOL) suppository 650 mg (has no administration in time range)  ondansetron (ZOFRAN) injection 4 mg (has no administration in time range)  promethazine (PHENERGAN) 12.5 mg in sodium chloride 0.9 % 50 mL IVPB (has no administration in time range)  labetalol (NORMODYNE) injection 10 mg (has no administration in time range)  fentaNYL (SUBLIMAZE) injection 25 mcg (has no administration in time range)  insulin aspart (novoLOG) injection 0-9 Units (has no administration in time range)  ondansetron (ZOFRAN) injection 4 mg (4 mg Intravenous Given 02/19/21 1817)  haloperidol lactate (HALDOL) injection 2.5 mg (2.5 mg Intravenous Given 02/19/21 1330)  diphenhydrAMINE (BENADRYL) injection 25 mg (25 mg Intravenous Given 02/19/21 1330)  sodium chloride 0.9 % bolus 1,000 mL (1,000 mLs Intravenous New Bag/Given 02/19/21 1330)  haloperidol lactate (HALDOL) injection 2.5 mg (2.5 mg Intravenous Given 02/19/21 1440)  iohexol (OMNIPAQUE) 350 MG/ML injection 100 mL (100 mLs Intravenous Contrast Given 02/19/21 1504)  metoCLOPramide (REGLAN) injection 10 mg (10 mg Intravenous Given 02/19/21 1625)  alum & mag hydroxide-simeth (MAALOX/MYLANTA) 200-200-20 MG/5ML suspension 30 mL (30 mLs Oral Given 02/19/21 1700)    And  lidocaine (XYLOCAINE) 2 % viscous mouth solution 15 mL (15 mLs Oral Given 02/19/21 1700)  morphine 4 MG/ML injection 6 mg (6 mg Intravenous Given 02/19/21 1816)     ED Discharge Orders     None        Note:  This document was prepared using Dragon voice recognition software and may include unintentional dictation errors.    Duffy Bruce, MD 02/19/21 1900

## 2021-02-19 NOTE — ED Notes (Addendum)
Unable to obtain accurate bp at this time.

## 2021-02-19 NOTE — ED Notes (Signed)
Patient observed sleeping loudly. No pain or distress observed at the time RN walked into room.  Once patient awakened, patient complained of pain and nausea.

## 2021-02-19 NOTE — ED Notes (Addendum)
Patient complaining of pain at this time. MD aware.

## 2021-02-19 NOTE — ED Notes (Signed)
MD at bedside. 

## 2021-02-19 NOTE — ED Provider Notes (Signed)
Patient received in signout from Dr. Ellender Hose pending CT scan and reassessment.  CT noted without evidence of diverticulitis, but with stigmata of distal esophagitis.  She has some improvement of painful symptoms after GI cocktail, but still is unable to tolerate p.o. intake.  Due to his persistent sinus tachycardia, poorly controlled symptoms despite various medications, we will discuss with hospitalist for admission.  Clinical Course as of 02/19/21 1807  Tue Feb 19, 2021  1559 Patient received in signout from Dr. Ellender Hose pending CT scan and reassessment.  CT noted without evidence of acute intra-abdominal pathology.  No evidence of diverticulitis. [DS]  [id number] I reassessed the patient and she is comfortably asleep, and awakens to vocal stimulation.  Abdominal exam is soft and benign, but as she wakens up and we discussed her symptoms and work-up, she hunches over and starts rocking in bed, complaining of abdominal pain.  We discussed benign CT and the possibility of outpatient management with GI follow-up.  She is hesitant and expresses some discomfort with this.  We discussed additional medications, p.o. challenge and redraw of lactic acid which unexpectedly increased in the setting of fluid resuscitation and initial normal value.  She is in agreement with plan of care. She disconnects her self from all monitors and IV site, and ambulates herself to the toilet to void without difficulty. [DS]  [id number].  Still tachycardic, uncomfortable and requesting admission. [DS]    Clinical Course User Index [DS] Vladimir Crofts, MD      Vladimir Crofts, MD 02/19/21 [phone]

## 2021-02-19 NOTE — ED Triage Notes (Signed)
C/O persistent abdominal pain, nausea, vomiting.  Vomiting and dry heaving in waiting area.

## 2021-02-19 NOTE — ED Notes (Signed)
Patient complaining of pain and states, "I can't live like this. I feel terrible. Give me Dilaudid or something to put me out."  MD aware. Orders placed.

## 2021-02-20 ENCOUNTER — Telehealth: Payer: Self-pay | Admitting: Internal Medicine

## 2021-02-20 DIAGNOSIS — E119 Type 2 diabetes mellitus without complications: Secondary | ICD-10-CM | POA: Diagnosis not present

## 2021-02-20 DIAGNOSIS — I10 Essential (primary) hypertension: Secondary | ICD-10-CM | POA: Diagnosis not present

## 2021-02-20 DIAGNOSIS — R112 Nausea with vomiting, unspecified: Secondary | ICD-10-CM | POA: Diagnosis not present

## 2021-02-20 LAB — COMPREHENSIVE METABOLIC PANEL
ALT: 19 U/L (ref 0–44)
AST: 26 U/L (ref 15–41)
Albumin: 3.4 g/dL — ABNORMAL LOW (ref 3.5–5.0)
Alkaline Phosphatase: 42 U/L (ref 38–126)
Anion gap: 9 (ref 5–15)
BUN: 14 mg/dL (ref 6–20)
CO2: 28 mmol/L (ref 22–32)
Calcium: 8.3 mg/dL — ABNORMAL LOW (ref 8.9–10.3)
Chloride: 102 mmol/L (ref 98–111)
Creatinine, Ser: 0.99 mg/dL (ref 0.44–1.00)
GFR, Estimated: >60 mL/min (ref >60)
Glucose, Bld: 125 mg/dL — ABNORMAL HIGH (ref 70–99)
Potassium: 3.3 mmol/L — ABNORMAL LOW (ref 3.5–5.1)
Sodium: 139 mmol/L (ref 135–145)
Total Bilirubin: 0.6 mg/dL (ref 0.3–1.2)
Total Protein: 6.1 g/dL — ABNORMAL LOW (ref 6.5–8.1)

## 2021-02-20 LAB — CBC WITH DIFFERENTIAL/PLATELET
Abs Immature Granulocytes: 0.05 10*3/uL (ref 0.00–0.07)
Basophils Absolute: 0 10*3/uL (ref 0.0–0.1)
Basophils Relative: 0 %
Eosinophils Absolute: 0.1 10*3/uL (ref 0.0–0.5)
Eosinophils Relative: 1 %
HCT: 32.9 % — ABNORMAL LOW (ref 36.0–46.0)
Hemoglobin: 11.1 g/dL — ABNORMAL LOW (ref 12.0–15.0)
Immature Granulocytes: 0 %
Lymphocytes Relative: 35 %
Lymphs Abs: 4.5 10*3/uL — ABNORMAL HIGH (ref 0.7–4.0)
MCH: 30.2 pg (ref 26.0–34.0)
MCHC: 33.7 g/dL (ref 30.0–36.0)
MCV: 89.6 fL (ref 80.0–100.0)
Monocytes Absolute: 1.3 10*3/uL — ABNORMAL HIGH (ref 0.1–1.0)
Monocytes Relative: 10 %
Neutro Abs: 6.9 10*3/uL (ref 1.7–7.7)
Neutrophils Relative %: 54 %
Platelets: 406 10*3/uL — ABNORMAL HIGH (ref 150–400)
RBC: 3.67 MIL/uL — ABNORMAL LOW (ref 3.87–5.11)
RDW: 13 % (ref 11.5–15.5)
Smear Review: NORMAL
WBC: 12.9 10*3/uL — ABNORMAL HIGH (ref 4.0–10.5)
nRBC: 0 % (ref 0.0–0.2)

## 2021-02-20 LAB — GLUCOSE, CAPILLARY: Glucose-Capillary: 100 mg/dL — ABNORMAL HIGH (ref 70–99)

## 2021-02-20 LAB — MAGNESIUM: Magnesium: 1.9 mg/dL (ref 1.7–2.4)

## 2021-02-20 MED ORDER — POTASSIUM CHLORIDE CRYS ER 20 MEQ PO TBCR
40.0000 meq | EXTENDED_RELEASE_TABLET | Freq: Once | ORAL | Status: AC
  Administered 2021-02-20: 08:00:00 40 meq via ORAL
  Filled 2021-02-20: qty 2

## 2021-02-20 MED ORDER — METOCLOPRAMIDE HCL 10 MG PO TABS: 10.0000 mg | ORAL_TABLET | Freq: Three times a day (TID) | ORAL | 0 refills | Status: DC | PRN

## 2021-02-20 MED ORDER — MAGNESIUM SULFATE 2 GM/50ML IV SOLN
2.0000 g | Freq: Once | INTRAVENOUS | Status: AC
  Administered 2021-02-20: 2 g via INTRAVENOUS
  Filled 2021-02-20: qty 50

## 2021-02-20 NOTE — Telephone Encounter (Signed)
Patient states that a women contacted her and states she was due for a glaucoma test and Gave her [employer]. States they informed her the test would be done here and that Dr Olivia Mackie McLean-Scocuzza would put in orders.   Have you recently faced her eye doctor for her to have an exam? Please advise

## 2021-02-20 NOTE — Telephone Encounter (Signed)
Spoke with Juliann Pulse and Patient was on the schedule tomorrow at 10:30 am for an eye exam. We do have someone that comes in to the office to have this done.   Called Patient back and informed her. Patient verbalized understanding and confirmed appointment for tomorrow, 02/21/21 at 10:30 am.

## 2021-02-20 NOTE — Telephone Encounter (Signed)
Patient was informed via previous call to discuss this with Gi at upcoming appointment end of august

## 2021-02-20 NOTE — Progress Notes (Signed)
PATIENT DETAILS Name: Shawna Anderson Age: 55 y.o. Sex: female Date of Birth: November 06, 1965 MRN: [id number]. Admitting Physician: Rhetta Mura, DO [id number], Nino Glow, MD  Admit Date: 02/19/2021 Discharge date: 02/20/2021  Recommendations for Outpatient Follow-up:  Follow up with PCP in 1-2 weeks Please obtain CMP/CBC in one week Encourage complete abstinence from marijuana use  Admitted From:  Home  Disposition: Toco: No  Equipment/Devices: None  Discharge Condition: Stable  CODE STATUS: FULL CODE  Diet recommendation:  Diet Order             Diet - low sodium heart healthy           Diet Carb Modified           Diet regular Room service appropriate? Yes; Fluid consistency: Thin  Diet effective now                    Brief Summary: See H&P, Labs, Consult and Test reports for all details in brief, patient is a 55 year old female with history of DM-2, bipolar disorder, GERD, chronic abdominal pain, HTN-marijuana use who presented with nausea and vomiting.  She was subsequently admitted to the hospitalist service for further evaluation and treatment.  See below for further details.  Brief Hospital Course: Nausea and vomiting: Claims she has a history of underlying gastroparesis-probably worsened due to marijuana use.  Managed with supportive care-nausea/vomiting has completely resolved-tolerating diet.  Encouraged patient to stop using marijuana-she claims she will "think about it" and gradually wean herself off it.  She was placed on PPI and Reglan-on discharge she will continue with PPI-and will continue with Reglan as needed.  Note-CT abdomen was negative for any acute abnormalities.  Urine drug screen positive for opiates/cocaine/marijuana: She vehemently denies use of cocaine or opiates-acknowledges marijuana use.  She had a urine drug screen in 2019 that was positive for cocaine as well.  She has been counseled regarding  avoiding illicit substances extensively this morning.  Rest of her medical problems were stable during his short overnight hospitalization.  Interventions: Refer to RD note for recommendations  Obesity: Estimated body mass index is 33.78 kg/m as calculated from the following:   Height as of this encounter: '5\' 3"'$  (1.6 m).   Weight as of this encounter: 86.5 kg.      Procedures None  Discharge Diagnoses:  Principal Problem:   Intractable nausea and vomiting Active Problems:   Essential hypertension   Hyperlipidemia associated with type 2 diabetes mellitus (HCC)   Gastroesophageal reflux disease   Epigastric pain   SIRS (systemic inflammatory response syndrome) (HCC)   High anion gap metabolic acidosis   Elevated serum creatinine   Diabetes mellitus without complication [facility])   Discharge Instructions:  Activity:  As tolerated   Discharge Instructions     Call MD for:  persistant nausea and vomiting   Complete by: As directed    Diet - low sodium heart healthy   Complete by: As directed    Diet Carb Modified   Complete by: As directed    Discharge instructions   Complete by: As directed    Follow with Primary MD  McLean-Scocuzza, Nino Glow, MD in 1-2 weeks  Avoid further marijuana use  Consume small portion meals/gastroparesis diet.  Please get a complete blood count and chemistry panel checked by your Primary MD at your next visit, and again as instructed by your Primary MD.  Get Medicines reviewed and adjusted: Please take  all your medications with you for your next visit with your Primary MD  Laboratory/radiological data: Please request your Primary MD to go over all hospital tests and procedure/radiological results at the follow up, please ask your Primary MD to get all Hospital records sent to his/her office.  In some cases, they will be blood work, cultures and biopsy results pending at the time of your discharge. Please request that your primary care M.D.  follows up on these results.  Also Note the following: If you experience worsening of your admission symptoms, develop shortness of breath, life threatening emergency, suicidal or homicidal thoughts you must seek medical attention immediately by calling 911 or calling your MD immediately  if symptoms less severe.  You must read complete instructions/literature along with all the possible adverse reactions/side effects for all the Medicines you take and that have been prescribed to you. Take any new Medicines after you have completely understood and accpet all the possible adverse reactions/side effects.   Do not drive when taking Pain medications or sleeping medications (Benzodaizepines)  Do not take more than prescribed Pain, Sleep and Anxiety Medications. It is not advisable to combine anxiety,sleep and pain medications without talking with your primary care practitioner  Special Instructions: If you have smoked or chewed Tobacco  in the last 2 yrs please stop smoking, stop any regular Alcohol  and or any Recreational drug use.  Wear Seat belts while driving.  Please note: You were cared for by a hospitalist during your hospital stay. Once you are discharged, your primary care physician will handle any further medical issues. Please note that NO REFILLS for any discharge medications will be authorized once you are discharged, as it is imperative that you return to your primary care physician (or establish a relationship with a primary care physician if you do not have one) for your post hospital discharge needs so that they can reassess your need for medications and monitor your lab values.   Increase activity slowly   Complete by: As directed       Allergies as of 02/20/2021       Reactions   Ativan [lorazepam]    Pt states it makes her tongue do weird things    Augmentin [amoxicillin-pot Clavulanate]    Upset stomach   Latex Other (See Comments)   Patient stated that she was told by  her doctor that she is "allergic to" this   Tape Other (See Comments)   Patient stated that she was told by her doctor that she is "allergic to" this   Xifaxan [rifaximin]    Pt believes this is contributing to her abdominal pain/discomfort   Drixoral Allergy Sinus [dexbromphen-pse-apap Er] Rash        Medication List     TAKE these medications    albuterol 108 (90 Base) MCG/ACT inhaler Commonly known as: ProAir HFA Inhale 1-2 puffs into the lungs every 4 (four) hours as needed for wheezing or shortness of breath.   amLODipine 10 MG tablet Commonly known as: NORVASC Take 1 tablet (10 mg total) by mouth at bedtime.   aspirin 81 MG chewable tablet Chew 81 mg by mouth daily.   atorvastatin 80 MG tablet Commonly known as: LIPITOR Take 1 tablet (80 mg total) by mouth daily at 6 PM.   Breztri Aerosphere 160-9-4.8 MCG/ACT Aero Generic drug: Budeson-Glycopyrrol-Formoterol Inhale 2 puffs into the lungs in the morning and at bedtime. Rinse   clopidogrel 75 MG tablet Commonly known as: PLAVIX Take  1 tablet (75 mg total) by mouth daily.   divalproex 500 MG DR tablet Commonly known as: DEPAKOTE TAKE 1 TABLET BY MOUTH  TWICE DAILY   ezetimibe 10 MG tablet Commonly known as: ZETIA TAKE 1 TABLET BY MOUTH  DAILY WITH LIPITOR 80 MG AT NIGHT   icosapent Ethyl 1 g capsule Commonly known as: Vascepa Take 2 capsules (2 g total) by mouth 2 (two) times daily.   isosorbide mononitrate 60 MG 24 hr tablet Commonly known as: IMDUR Take 1 tablet (60 mg total) by mouth in the morning and at bedtime.   methocarbamol 500 MG tablet Commonly known as: Robaxin Take 1 tablet (500 mg total) by mouth 2 (two) times daily as needed for muscle spasms.   metoCLOPramide 10 MG tablet Commonly known as: REGLAN Take 1 tablet (10 mg total) by mouth every 8 (eight) hours as needed for nausea.   nitroGLYCERIN 0.4 MG SL tablet Commonly known as: NITROSTAT DISSOLVE 1 TABLET UNDER  TONGUE EVERY 5 MINUTES  AS  NEEDED FOR CHEST PAIN MAX  OF 3 TABLETS IN 15 MINUTES  . CALL 911 AFTER THIRD DOSE   olmesartan-hydrochlorothiazide 20-12.5 MG tablet Commonly known as: BENICAR HCT Take 0.5 tablets by mouth daily. If  BP >130/>80 may increase to 1 pill qam. Stop 40-25 mg dose   pantoprazole 40 MG tablet Commonly known as: PROTONIX TAKE 1 TABLET BY MOUTH  TWICE DAILY BEFORE MEALS   traMADol 50 MG tablet Commonly known as: ULTRAM Take 1 tablet (50 mg total) by mouth every 6 (six) hours as needed for moderate pain.   traZODone 150 MG tablet Commonly known as: DESYREL Take 1 tablet (150 mg total) by mouth at bedtime as needed for sleep.   Vitamin D (Cholecalciferol) 25 MCG (1000 UT) Caps Take 1 capsule by mouth daily.        Follow-up Information     McLean-Scocuzza, Nino Glow, MD. Schedule an appointment as soon as possible for a visit in 1 week(s).   Specialty: Internal Medicine Contact information: [place] [address] [phone]         End, Harrell Gave, MD Follow up in 1 month(s).   Specialty: Cardiology Contact information: [place] [address] [phone]                Allergies  Allergen Reactions   Ativan [Lorazepam]     Pt states it makes her tongue do weird things    Augmentin [Amoxicillin-Pot Clavulanate]     Upset stomach    Latex Other (See Comments)    Patient stated that she was told by her doctor that she is "allergic to" this   Tape Other (See Comments)    Patient stated that she was told by her doctor that she is "allergic to" this   Xifaxan [Rifaximin]     Pt believes this is contributing to her abdominal pain/discomfort   Drixoral Allergy Sinus [Dexbromphen-Pse-Apap Er] Rash      Consultations: None   Other Procedures/Studies: CT ABDOMEN PELVIS WO CONTRAST  Result Date: 02/10/2021 CLINICAL DATA:  Abdominal pain. Nausea and vomiting. EXAM: CT ABDOMEN AND PELVIS WITHOUT CONTRAST  TECHNIQUE: Multidetector CT imaging of the abdomen and pelvis was performed following the standard protocol without IV contrast. COMPARISON:  01/06/2021 FINDINGS: Lower chest: No acute findings. Hepatobiliary: No mass visualized on this unenhanced exam. Gallbladder is unremarkable. No evidence of biliary ductal dilatation. Pancreas: No mass or inflammatory process visualized on this  unenhanced exam. Spleen:  Within normal limits in size. Adrenals/Urinary tract: No evidence of urolithiasis or hydronephrosis. Urinary bladder is empty. Stomach/Bowel: No evidence of obstruction, inflammatory process, or abnormal fluid collections. Surgical anastomosis again seen in the sigmoid colon. Vascular/Lymphatic: No pathologically enlarged lymph nodes identified. No evidence of abdominal aortic aneurysm. Aortic atherosclerotic calcification noted. Reproductive: Prior hysterectomy noted. Adnexal regions are unremarkable in appearance. Other:  None. Musculoskeletal:  No suspicious bone lesions identified. IMPRESSION: No acute findings or other significant abnormality identified. Aortic Atherosclerosis (ICD10-I70.0). Electronically Signed   By: Marlaine Hind M.D.   On: 02/10/2021 17:48   CT ABDOMEN PELVIS W CONTRAST  Result Date: 02/19/2021 CLINICAL DATA:  Persistent abdominal pain nausea and vomiting. EXAM: CT ABDOMEN AND PELVIS WITH CONTRAST TECHNIQUE: Multidetector CT imaging of the abdomen and pelvis was performed using the standard protocol following bolus administration of intravenous contrast. CONTRAST:  168m OMNIPAQUE IOHEXOL 350 MG/ML SOLN COMPARISON:  CT February 10, 2021 FINDINGS: Despite efforts by the technologist and patient, motion artifact is present on today's exam and could not be eliminated. This reduces exam sensitivity and specificity. Lower chest: No acute abnormality. Hepatobiliary: No focal liver abnormality is seen. No gallstones, gallbladder wall thickening, or biliary dilatation. Pancreas: Unremarkable. No  pancreatic ductal dilatation or surrounding inflammatory changes. Spleen: Normal in size without focal abnormality. Adrenals/Urinary Tract: Adrenal glands are unremarkable. Kidneys are normal, without renal calculi, solid enhancing lesion, or hydronephrosis. Bladder is unremarkable for degree of distension. Stomach/Bowel: Low-density wall thickening of the distal esophagus extending to the GE junction. Stomach is unremarkable for degree of distension. No pathologic dilation of small bowel. There are few scattered colonic diverticula without findings of acute diverticulitis. Sigmoid colonic anastomotic sutures. Vascular/Lymphatic: Aortic atherosclerosis without aneurysmal dilation. No pathologically enlarged abdominal or pelvic lymph nodes. Reproductive: Status post hysterectomy. No adnexal masses. Other: No abdominopelvic ascites. Musculoskeletal: Mild lower lumbar spondylosis. No acute osseous abnormality. IMPRESSION: 1. Low-density wall thickening of the distal esophagus extending to the GE junction may represent esophagitis. 2. Scattered colonic diverticula without findings of acute diverticulitis. 3.  Aortic Atherosclerosis (ICD10-I70.0). Electronically Signed   By: JDahlia BailiffMD   On: 02/19/2021 15:44   DG Chest Port 1 View  Result Date: 02/19/2021 CLINICAL DATA:  Leukocytosis EXAM: PORTABLE CHEST 1 VIEW COMPARISON:  Radiograph 02/10/2021 FINDINGS: Accounting for body habitus, the lungs are clear. No consolidation, features of edema, pneumothorax, or effusion. Pulmonary vascularity is normally distributed. The cardiomediastinal contours are unremarkable. No acute osseous or soft tissue abnormality. Telemetry leads overlie the chest. IMPRESSION: No acute cardiopulmonary abnormality. Electronically Signed   By: PLovena LeM.D.   On: 02/19/2021 19:43   DG Chest Port 1 View  Result Date: 02/10/2021 CLINICAL DATA:  Questionable sepsis.  Evaluate for abnormality. EXAM: PORTABLE CHEST 1 VIEW COMPARISON:   May 22, 2020 FINDINGS: The heart size and mediastinal contours are within normal limits. Both lungs are clear. The visualized skeletal structures are unremarkable. IMPRESSION: No active disease. Electronically Signed   By: DDorise BullionIII M.D   On: 02/10/2021 17:59   CT Angio Abd/Pel W and/or Wo Contrast  Result Date: 02/10/2021 CLINICAL DATA:  Left upper quadrant abdominal pain.  Vomiting. EXAM: CTA ABDOMEN AND PELVIS WITHOUT AND WITH CONTRAST TECHNIQUE: Multidetector CT imaging of the abdomen and pelvis was performed using the standard protocol during bolus administration of intravenous contrast. Multiplanar reconstructed images and MIPs were obtained and reviewed to evaluate the vascular anatomy. CONTRAST:  1015mOMNIPAQUE IOHEXOL 350 MG/ML  SOLN COMPARISON:  CT abdomen pelvis earlier same day. FINDINGS: VASCULAR Aorta: Normal caliber abdominal aorta. Peripheral calcified atherosclerotic plaque. Celiac: Patent.  No evidence for aneurysm or dissection. SMA: Unremarkable Renals: Mild atherosclerotic narrowing at the origin of the right left renal arteries. IMA: Patent Inflow: Patent without evidence of aneurysm, dissection, vasculitis or significant stenosis. Proximal Outflow: Bilateral common femoral and visualized portions of the superficial and profunda femoral arteries are patent without evidence of aneurysm, dissection, vasculitis or significant stenosis. Veins: No obvious venous abnormality within the limitations of this arterial phase study. Review of the MIP images confirms the above findings. NON-VASCULAR Lower chest: Normal heart size.  Lung bases are clear. Hepatobiliary: Liver is normal in size and contour. No focal lesion identified. Gallbladder is unremarkable. No intrahepatic or extrahepatic biliary ductal dilatation. Pancreas: Unremarkable Spleen: Unremarkable Adrenals/Urinary Tract: Normal adrenal glands. Kidneys enhance symmetrically with contrast. No hydronephrosis. Urinary bladder is  unremarkable. Stomach/Bowel: Postsurgical changes involving the sigmoid colon. There is mild circumferential wall thickening of the descending colon. No evidence for free fluid or free intraperitoneal air. No evidence for bowel obstruction. Normal morphology of the stomach. Lymphatic: Normal caliber abdominal aorta. Peripheral calcified atherosclerotic plaque. No retroperitoneal lymphadenopathy. Reproductive: Prior hysterectomy. Other: None. Musculoskeletal: No aggressive or acute appearing osseous lesions. Lower thoracic lumbar spine degenerative changes. IMPRESSION: VASCULAR No acute vascular process. NON-VASCULAR There is mild circumferential wall thickening of the descending colon which may be secondary to colitis or diverticulitis. Otherwise no acute process within the abdomen or pelvis. Electronically Signed   By: Lovey Newcomer M.D.   On: 02/10/2021 19:20     TODAY-DAY OF DISCHARGE:  Subjective:   [facility] today has no headache,no chest abdominal pain,no new weakness tingling or numbness, feels much better wants to go home today.   Objective:   Blood pressure 112/65, pulse 70, temperature 98.3 F (36.8 C), temperature source Oral, resp. rate 16, height '5\' 3"'$  (1.6 m), weight 86.5 kg, SpO2 100 %.  Intake/Output Summary (Last 24 hours) at 02/20/2021 0959 Last data filed at 02/20/2021 0811 Gross per 24 hour  Intake 1341.85 ml  Output --  Net 1341.85 ml   Filed Weights   02/19/21 1249 02/19/21 2000  Weight: 86 kg 86.5 kg    Exam: Awake Alert, Oriented *3, No new F.N deficits, Normal affect [place].AT,PERRAL Supple Neck,No JVD, No cervical lymphadenopathy appriciated.  Symmetrical Chest wall movement, Good air movement bilaterally, CTAB RRR,No Gallops,Rubs or new Murmurs, No Parasternal Heave +ve B.Sounds, Abd Soft, Non tender, No organomegaly appriciated, No rebound -guarding or rigidity. No Cyanosis, Clubbing or edema, No new Rash or bruise   PERTINENT RADIOLOGIC STUDIES: CT  ABDOMEN PELVIS W CONTRAST  Result Date: 02/19/2021 CLINICAL DATA:  Persistent abdominal pain nausea and vomiting. EXAM: CT ABDOMEN AND PELVIS WITH CONTRAST TECHNIQUE: Multidetector CT imaging of the abdomen and pelvis was performed using the standard protocol following bolus administration of intravenous contrast. CONTRAST:  135m OMNIPAQUE IOHEXOL 350 MG/ML SOLN COMPARISON:  CT February 10, 2021 FINDINGS: Despite efforts by the technologist and patient, motion artifact is present on today's exam and could not be eliminated. This reduces exam sensitivity and specificity. Lower chest: No acute abnormality. Hepatobiliary: No focal liver abnormality is seen. No gallstones, gallbladder wall thickening, or biliary dilatation. Pancreas: Unremarkable. No pancreatic ductal dilatation or surrounding inflammatory changes. Spleen: Normal in size without focal abnormality. Adrenals/Urinary Tract: Adrenal glands are unremarkable. Kidneys are normal, without renal calculi, solid enhancing lesion, or hydronephrosis. Bladder is unremarkable for degree  of distension. Stomach/Bowel: Low-density wall thickening of the distal esophagus extending to the GE junction. Stomach is unremarkable for degree of distension. No pathologic dilation of small bowel. There are few scattered colonic diverticula without findings of acute diverticulitis. Sigmoid colonic anastomotic sutures. Vascular/Lymphatic: Aortic atherosclerosis without aneurysmal dilation. No pathologically enlarged abdominal or pelvic lymph nodes. Reproductive: Status post hysterectomy. No adnexal masses. Other: No abdominopelvic ascites. Musculoskeletal: Mild lower lumbar spondylosis. No acute osseous abnormality. IMPRESSION: 1. Low-density wall thickening of the distal esophagus extending to the GE junction may represent esophagitis. 2. Scattered colonic diverticula without findings of acute diverticulitis. 3.  Aortic Atherosclerosis (ICD10-I70.0). Electronically Signed   By:  Dahlia Bailiff MD   On: 02/19/2021 15:44   DG Chest Port 1 View  Result Date: 02/19/2021 CLINICAL DATA:  Leukocytosis EXAM: PORTABLE CHEST 1 VIEW COMPARISON:  Radiograph 02/10/2021 FINDINGS: Accounting for body habitus, the lungs are clear. No consolidation, features of edema, pneumothorax, or effusion. Pulmonary vascularity is normally distributed. The cardiomediastinal contours are unremarkable. No acute osseous or soft tissue abnormality. Telemetry leads overlie the chest. IMPRESSION: No acute cardiopulmonary abnormality. Electronically Signed   By: Lovena Le M.D.   On: 02/19/2021 19:43     PERTINENT LAB RESULTS: CBC: Recent Labs    02/19/21 1252 02/20/21 0420  WBC 18.0* 12.9*  HGB 13.8 11.1*  HCT 39.4 32.9*  PLT 569* 406*   CMET CMP     Component Value Date/Time   NA 139 02/20/2021 0420   NA 140 10/26/2014 1334   K 3.3 (L) 02/20/2021 0420   K 3.7 10/26/2014 1334   CL 102 02/20/2021 0420   CL 106 10/26/2014 1334   CO2 28 02/20/2021 0420   CO2 27 10/26/2014 1334   GLUCOSE 125 (H) 02/20/2021 0420   GLUCOSE 96 10/26/2014 1334   BUN 14 02/20/2021 0420   BUN 17 10/26/2014 1334   CREATININE 0.99 02/20/2021 0420   CREATININE 0.74 12/31/2017 0905   CALCIUM 8.3 (L) 02/20/2021 0420   CALCIUM 9.3 10/26/2014 1334   PROT 6.1 (L) 02/20/2021 0420   PROT 7.6 10/26/2014 1334   ALBUMIN 3.4 (L) 02/20/2021 0420   ALBUMIN 4.3 10/26/2014 1334   AST 26 02/20/2021 0420   AST 24 10/26/2014 1334   ALT 19 02/20/2021 0420   ALT 35 10/26/2014 1334   ALKPHOS 42 02/20/2021 0420   ALKPHOS 90 10/26/2014 1334   BILITOT 0.6 02/20/2021 0420   BILITOT 0.7 10/26/2014 1334   GFRNONAA >60 02/20/2021 0420   GFRNONAA >60 10/26/2014 1334   GFRAA >60 09/21/2019 0927   GFRAA >60 10/26/2014 1334    GFR Estimated Creatinine Clearance: 68.5 mL/min (by C-G formula based on SCr of 0.99 mg/dL). Recent Labs    02/19/21 1252  LIPASE 60*   No results for input(s): CKTOTAL, CKMB, CKMBINDEX, TROPONINI in  the last 72 hours. Invalid input(s): POCBNP No results for input(s): DDIMER in the last 72 hours. Recent Labs    02/19/21 1252  HGBA1C 6.2*   No results for input(s): CHOL, HDL, LDLCALC, TRIG, CHOLHDL, LDLDIRECT in the last 72 hours. Recent Labs    02/19/21 2006  TSH 3.052   No results for input(s): VITAMINB12, FOLATE, FERRITIN, TIBC, IRON, RETICCTPCT in the last 72 hours. Coags: No results for input(s): INR in the last 72 hours.  Invalid input(s): PT Microbiology: Recent Results (from the past 240 hour(s))  Urine Culture     Status: None   Collection Time: 02/10/21  3:51 PM   Specimen:  In/Out Cath Urine  Result Value Ref Range Status   Specimen Description   Final    IN/OUT CATH URINE Performed at [facility], [address] [place], [place] [address]    Special Requests   Final    NONE Performed at [facility], [address] [place], [place] [address]    Culture   Final    NO GROWTH Performed at [facility], [place] [address], [place], [place] [address]    Report Status 02/12/2021 FINAL  Final  Blood culture (routine single)     Status: Abnormal   Collection Time: 02/10/21  5:19 PM   Specimen: BLOOD  Result Value Ref Range Status   Specimen Description   Final    BLOOD LFA Performed at [facility], [address] [place], [place] [address]    Special Requests   Final    BOTTLES DRAWN AEROBIC AND ANAEROBIC Blood Culture results may not be optimal due to an inadequate volume of blood received in culture bottles Performed at [facility], [address], [place], [place] [address]    Culture  Setup Time   Final    GRAM POSITIVE COCCI ANAEROBIC BOTTLE ONLY Organism ID to follow CRITICAL RESULT CALLED TO, READ BACK BY AND VERIFIED WITH: Rito Ehrlich, PHARMD AT 1108 ON 02/11/21 BY GM Performed at [facility], [address] [place], [place] [address]    Culture (A)  Final    STAPHYLOCOCCUS  EPIDERMIDIS THE SIGNIFICANCE OF ISOLATING THIS ORGANISM FROM A SINGLE VENIPUNCTURE CANNOT BE PREDICTED WITHOUT FURTHER CLINICAL AND CULTURE CORRELATION. SUSCEPTIBILITIES AVAILABLE ONLY ON REQUEST. Performed at [facility], [place] [address] [place], [place] [address]    Report Status 02/13/2021 FINAL  Final  Blood Culture ID Panel (Reflexed)     Status: Abnormal   Collection Time: 02/10/21  5:19 PM  Result Value Ref Range Status   Enterococcus faecalis NOT DETECTED NOT DETECTED Final   Enterococcus Faecium NOT DETECTED NOT DETECTED Final   Listeria monocytogenes NOT DETECTED NOT DETECTED Final   Staphylococcus species DETECTED (A) NOT DETECTED Final    Comment: CRITICAL RESULT CALLED TO, READ BACK BY AND VERIFIED WITH: Rito Ehrlich, PHARMD AT 1108 ON 02/11/21 BY GM    Staphylococcus aureus (BCID) NOT DETECTED NOT DETECTED Final   Staphylococcus epidermidis DETECTED (A) NOT DETECTED Final    Comment: Methicillin (oxacillin) resistant coagulase negative staphylococcus. Possible blood culture contaminant (unless isolated from more than one blood culture draw or clinical case suggests pathogenicity). No antibiotic treatment is indicated for blood  culture contaminants. CRITICAL RESULT CALLED TO, READ BACK BY AND VERIFIED WITH: Rito Ehrlich, PHARMD AT 1108 ON 02/11/21 BY GM    Staphylococcus lugdunensis NOT DETECTED NOT DETECTED Final   Streptococcus species NOT DETECTED NOT DETECTED Final   Streptococcus agalactiae NOT DETECTED NOT DETECTED Final   Streptococcus pneumoniae NOT DETECTED NOT DETECTED Final   Streptococcus pyogenes NOT DETECTED NOT DETECTED Final   A.calcoaceticus-baumannii NOT DETECTED NOT DETECTED Final   Bacteroides fragilis NOT DETECTED NOT DETECTED Final   Enterobacterales NOT DETECTED NOT DETECTED Final   Enterobacter cloacae complex NOT DETECTED NOT DETECTED Final   Escherichia coli NOT DETECTED NOT DETECTED Final   Klebsiella aerogenes NOT DETECTED NOT DETECTED  Final   Klebsiella oxytoca NOT DETECTED NOT DETECTED Final   Klebsiella pneumoniae NOT DETECTED NOT DETECTED Final   Proteus species NOT DETECTED NOT DETECTED Final   Salmonella species NOT DETECTED NOT  DETECTED Final   Serratia marcescens NOT DETECTED NOT DETECTED Final   Haemophilus influenzae NOT DETECTED NOT DETECTED Final   Neisseria meningitidis NOT DETECTED NOT DETECTED Final   Pseudomonas aeruginosa NOT DETECTED NOT DETECTED Final   Stenotrophomonas maltophilia NOT DETECTED NOT DETECTED Final   Candida albicans NOT DETECTED NOT DETECTED Final   Candida auris NOT DETECTED NOT DETECTED Final   Candida glabrata NOT DETECTED NOT DETECTED Final   Candida krusei NOT DETECTED NOT DETECTED Final   Candida parapsilosis NOT DETECTED NOT DETECTED Final   Candida tropicalis NOT DETECTED NOT DETECTED Final   Cryptococcus neoformans/gattii NOT DETECTED NOT DETECTED Final   Methicillin resistance mecA/C DETECTED (A) NOT DETECTED Final    Comment: CRITICAL RESULT CALLED TO, READ BACK BY AND VERIFIED WITH: Rito Ehrlich, PHARMD AT 1108 ON 02/11/21 BY GM Performed at [facility], [address] [place], [place] [address]   Resp Panel by RT-PCR (Flu A&B, Covid) Nasopharyngeal Swab     Status: None   Collection Time: 02/11/21 10:11 AM   Specimen: Nasopharyngeal Swab; Nasopharyngeal(NP) swabs in vial transport medium  Result Value Ref Range Status   SARS Coronavirus 2 by RT PCR NEGATIVE NEGATIVE Final    Comment: (NOTE) SARS-CoV-2 target nucleic acids are NOT DETECTED.  The SARS-CoV-2 RNA is generally detectable in upper respiratory specimens during the acute phase of infection. The lowest concentration of SARS-CoV-2 viral copies this assay can detect is 138 copies/mL. A negative result does not preclude SARS-Cov-2 infection and should not be used as the sole basis for treatment or other patient management decisions. A negative result may occur with  improper specimen  collection/handling, submission of specimen other than nasopharyngeal swab, presence of viral mutation(s) within the areas targeted by this assay, and inadequate number of viral copies(<138 copies/mL). A negative result must be combined with clinical observations, patient history, and epidemiological information. The expected result is Negative.  Fact Sheet for Patients:  EntrepreneurPulse.com.au  Fact Sheet for Healthcare Providers:  IncredibleEmployment.be  This test is no t yet approved or cleared by the [place] FDA and  has been authorized for detection and/or diagnosis of SARS-CoV-2 by FDA under an Emergency Use Authorization (EUA). This EUA will remain  in effect (meaning this test can be used) for the duration of the COVID-19 declaration under Section 564(b)(1) of the Act, 21 U.S.C.section 360bbb-3(b)(1), unless the authorization is terminated  or revoked sooner.       Influenza A by PCR NEGATIVE NEGATIVE Final   Influenza B by PCR NEGATIVE NEGATIVE Final    Comment: (NOTE) The Xpert Xpress SARS-CoV-2/FLU/RSV plus assay is intended as an aid in the diagnosis of influenza from Nasopharyngeal swab specimens and should not be used as a sole basis for treatment. Nasal washings and aspirates are unacceptable for Xpert Xpress SARS-CoV-2/FLU/RSV testing.  Fact Sheet for Patients: EntrepreneurPulse.com.au  Fact Sheet for Healthcare Providers: IncredibleEmployment.be  This test is not yet approved or cleared by the [place] FDA and has been authorized for detection and/or diagnosis of SARS-CoV-2 by FDA under an Emergency Use Authorization (EUA). This EUA will remain in effect (meaning this test can be used) for the duration of the COVID-19 declaration under Section 564(b)(1) of the Act, 21 U.S.C. section 360bbb-3(b)(1), unless the authorization is terminated or revoked.  Performed at [facility], [address] [place], [place] [address]   Resp Panel by RT-PCR (Flu A&B, Covid) Nasopharyngeal Swab     Status: None   Collection  Time: 02/19/21  4:50 PM   Specimen: Nasopharyngeal Swab; Nasopharyngeal(NP) swabs in vial transport medium  Result Value Ref Range Status   SARS Coronavirus 2 by RT PCR NEGATIVE NEGATIVE Final    Comment: (NOTE) SARS-CoV-2 target nucleic acids are NOT DETECTED.  The SARS-CoV-2 RNA is generally detectable in upper respiratory specimens during the acute phase of infection. The lowest concentration of SARS-CoV-2 viral copies this assay can detect is 138 copies/mL. A negative result does not preclude SARS-Cov-2 infection and should not be used as the sole basis for treatment or other patient management decisions. A negative result may occur with  improper specimen collection/handling, submission of specimen other than nasopharyngeal swab, presence of viral mutation(s) within the areas targeted by this assay, and inadequate number of viral copies(<138 copies/mL). A negative result must be combined with clinical observations, patient history, and epidemiological information. The expected result is Negative.  Fact Sheet for Patients:  EntrepreneurPulse.com.au  Fact Sheet for Healthcare Providers:  IncredibleEmployment.be  This test is no t yet approved or cleared by the [place] FDA and  has been authorized for detection and/or diagnosis of SARS-CoV-2 by FDA under an Emergency Use Authorization (EUA). This EUA will remain  in effect (meaning this test can be used) for the duration of the COVID-19 declaration under Section 564(b)(1) of the Act, 21 U.S.C.section 360bbb-3(b)(1), unless the authorization is terminated  or revoked sooner.       Influenza A by PCR NEGATIVE NEGATIVE Final   Influenza B by PCR NEGATIVE NEGATIVE Final    Comment: (NOTE) The Xpert Xpress SARS-CoV-2/FLU/RSV plus assay is  intended as an aid in the diagnosis of influenza from Nasopharyngeal swab specimens and should not be used as a sole basis for treatment. Nasal washings and aspirates are unacceptable for Xpert Xpress SARS-CoV-2/FLU/RSV testing.  Fact Sheet for Patients: EntrepreneurPulse.com.au  Fact Sheet for Healthcare Providers: IncredibleEmployment.be  This test is not yet approved or cleared by the [place] FDA and has been authorized for detection and/or diagnosis of SARS-CoV-2 by FDA under an Emergency Use Authorization (EUA). This EUA will remain in effect (meaning this test can be used) for the duration of the COVID-19 declaration under Section 564(b)(1) of the Act, 21 U.S.C. section 360bbb-3(b)(1), unless the authorization is terminated or revoked.  Performed at [facility], [address] [place], [place] [address]     FURTHER DISCHARGE INSTRUCTIONS:  Get Medicines reviewed and adjusted: Please take all your medications with you for your next visit with your Primary MD  Laboratory/radiological data: Please request your Primary MD to go over all hospital tests and procedure/radiological results at the follow up, please ask your Primary MD to get all Hospital records sent to his/her office.  In some cases, they will be blood work, cultures and biopsy results pending at the time of your discharge. Please request that your primary care M.D. goes through all the records of your hospital data and follows up on these results.  Also Note the following: If you experience worsening of your admission symptoms, develop shortness of breath, life threatening emergency, suicidal or homicidal thoughts you must seek medical attention immediately by calling 911 or calling your MD immediately  if symptoms less severe.  You must read complete instructions/literature along with all the possible adverse reactions/side effects for all the Medicines you  take and that have been prescribed to you. Take any new Medicines after you have completely understood and accpet all the possible adverse reactions/side effects.  Do not drive when taking Pain medications or sleeping medications (Benzodaizepines)  Do not take more than prescribed Pain, Sleep and Anxiety Medications. It is not advisable to combine anxiety,sleep and pain medications without talking with your primary care practitioner  Special Instructions: If you have smoked or chewed Tobacco  in the last 2 yrs please stop smoking, stop any regular Alcohol  and or any Recreational drug use.  Wear Seat belts while driving.  Please note: You were cared for by a hospitalist during your hospital stay. Once you are discharged, your primary care physician will handle any further medical issues. Please note that NO REFILLS for any discharge medications will be authorized once you are discharged, as it is imperative that you return to your primary care physician (or establish a relationship with a primary care physician if you do not have one) for your post hospital discharge needs so that they can reassess your need for medications and monitor your lab values.  Total Time spent coordinating discharge including counseling, education and face to face time equals 25  minutes.  SignedOren Binet 02/20/2021 9:59 AM

## 2021-02-20 NOTE — Telephone Encounter (Signed)
Patient is calling in to let Dr.Tracy know she was admitted to the hospital last night and will not be able to make the glaucoma test she was set up for.Please advise the patient.

## 2021-02-20 NOTE — Progress Notes (Signed)
Initial Nutrition Assessment  DOCUMENTATION CODES:  Obesity unspecified  INTERVENTION:  Continue current diet as order, encourage PO intake Monitor intake trends for need for additional nutrition interventions.  NUTRITION DIAGNOSIS:  Altered GI function related to nausea, vomiting as evidenced by per patient/family report.  GOAL:  Patient will meet greater than or equal to 90% of their needs  MONITOR:  PO intake, Labs  REASON FOR ASSESSMENT:  Malnutrition Screening Tool    ASSESSMENT:  55 y.o. female presented to ED with complaints of nausea and vomiting ongoing x 2 days PTA. Reports she has been unable to keep down food, fluids, or medicines. PMH relevant for DM, HTN, GERD, COPD, gastroparesis, CAD, and HLD  Noted that pt has frequent ED visits for similar presentation. Has never been on gut motility agents to assist with gastroparesis. MD gave reglan and pt endorses an improvement in symptoms.    Pt reported a poor appetite PTA, likely acute and related to GI symptoms. No intake recorded in chart yet, but RN notes pt tolerating [place] ices and crackers before bed last night. Currently on a regular diet. Discussed with RN who confirms pt is also doing well with diet this AM.  Reviewed weight hx as pt also reported weight loss. Insignificant weight loss of .6kg noted over the last 3 months and gain is noted over the last year. No concerns with weight loss at this time. Likely related to dehydration from vomiting PTA.  Nutritionally Relevant Medications: Scheduled Meds:  insulin aspart  0-9 Units Subcutaneous TID WC   metoCLOPramide  10 mg Oral TID AC & HS   pantoprazole (PROTONIX) IV  40 mg Intravenous Q12H   Continuous Infusions:  lactated ringers 100 mL/hr at 02/20/21 0137   magnesium sulfate bolus IVPB 2 g (02/20/21 0811)   promethazine (PHENERGAN) injection (IM or IVPB)     PRN Meds: ondansetron, promethazine   Labs Reviewed: K 3.3 SBG ranges from 100-125 mg/dL over  the last 24 hours HgbA1c 6.2% (8/9)  NUTRITION - FOCUSED PHYSICAL EXAM: Defer to in-person assessment  Diet Order:   Diet Order             Diet regular Room service appropriate? Yes; Fluid consistency: Thin  Diet effective now                   EDUCATION NEEDS:  No education needs have been identified at this time  Skin:  Skin Assessment: Reviewed RN Assessment  Last BM:  8/7  Height:  Ht Readings from Last 1 Encounters:  02/19/21 '5\' 3"'$  (1.6 m)    Weight:  Wt Readings from Last 1 Encounters:  02/19/21 86.5 kg    Ideal Body Weight:  52.3 kg  BMI:  Body mass index is 33.78 kg/m.  Estimated Nutritional Needs:  Kcal:  1500-1700 kcal/d Protein:  75-85 g/d Fluid:  1.6-1.8 mL/d   Ranell Patrick, RD, LDN Clinical Dietitian Pager on [place]

## 2021-02-20 NOTE — Plan of Care (Addendum)
Pt admitted from ED overnight. Orientedx4, though anxious. Independent with ambulation in room. NSR on telemetry. Continued c/o nausea, addressed with PRN zofran and reglan. Pain in abdomen addressed with PRN fentanyl per MAR. Able to sleep soundly between interventions. Does report feeling better after dose of reglan, able to tolerate several frozen [place] ices and some crackers. Fall/safety precautions in place, rounding performed, needs/concerns addressed during shift.   Problem: Education: Goal: Knowledge of General Education information will improve Description: Including pain rating scale, medication(s)/side effects and non-pharmacologic comfort measures 02/20/2021 0116 by Jocelyn Lamer, RN Outcome: Progressing 02/19/2021 2024 by Jocelyn Lamer, RN Outcome: Progressing   Problem: Health Behavior/Discharge Planning: Goal: Ability to manage health-related needs will improve 02/20/2021 0116 by Jocelyn Lamer, RN Outcome: Progressing 02/19/2021 2024 by Jocelyn Lamer, RN Outcome: Progressing   Problem: Clinical Measurements: Goal: Ability to maintain clinical measurements within normal limits will improve 02/20/2021 0116 by Jocelyn Lamer, RN Outcome: Progressing 02/19/2021 2024 by Jocelyn Lamer, RN Outcome: Progressing Goal: Will remain free from infection 02/20/2021 0116 by Jocelyn Lamer, RN Outcome: Progressing 02/19/2021 2024 by Jocelyn Lamer, RN Outcome: Progressing Goal: Diagnostic test results will improve 02/20/2021 0116 by Jocelyn Lamer, RN Outcome: Progressing 02/19/2021 2024 by Jocelyn Lamer, RN Outcome: Progressing Goal: Respiratory complications will improve 02/20/2021 0116 by Jocelyn Lamer, RN Outcome: Progressing 02/19/2021 2024 by Jocelyn Lamer, RN Outcome: Progressing Goal: Cardiovascular complication will be avoided 02/20/2021 0116 by Jocelyn Lamer, RN Outcome: Progressing 02/19/2021 2024 by Jocelyn Lamer, RN Outcome: Progressing   Problem:  Activity: Goal: Risk for activity intolerance will decrease 02/20/2021 0116 by Jocelyn Lamer, RN Outcome: Progressing 02/19/2021 2024 by Jocelyn Lamer, RN Outcome: Progressing   Problem: Nutrition: Goal: Adequate nutrition will be maintained 02/20/2021 0116 by Jocelyn Lamer, RN Outcome: Progressing 02/19/2021 2024 by Jocelyn Lamer, RN Outcome: Progressing   Problem: Coping: Goal: Level of anxiety will decrease 02/20/2021 0116 by Jocelyn Lamer, RN Outcome: Progressing 02/19/2021 2024 by Jocelyn Lamer, RN Outcome: Progressing   Problem: Elimination: Goal: Will not experience complications related to bowel motility 02/20/2021 0116 by Jocelyn Lamer, RN Outcome: Progressing 02/19/2021 2024 by Jocelyn Lamer, RN Outcome: Progressing Goal: Will not experience complications related to urinary retention 02/20/2021 0116 by Jocelyn Lamer, RN Outcome: Progressing 02/19/2021 2024 by Jocelyn Lamer, RN Outcome: Progressing   Problem: Pain Managment: Goal: General experience of comfort will improve 02/20/2021 0116 by Jocelyn Lamer, RN Outcome: Progressing 02/19/2021 2024 by Jocelyn Lamer, RN Outcome: Progressing   Problem: Safety: Goal: Ability to remain free from injury will improve 02/20/2021 0116 by Jocelyn Lamer, RN Outcome: Progressing 02/19/2021 2024 by Jocelyn Lamer, RN Outcome: Progressing   Problem: Skin Integrity: Goal: Risk for impaired skin integrity will decrease 02/20/2021 0116 by Jocelyn Lamer, RN Outcome: Progressing 02/19/2021 2024 by Jocelyn Lamer, RN Outcome: Progressing

## 2021-02-20 NOTE — Discharge Summary (Signed)
PATIENT DETAILS Name: Shawna Anderson Age: 55 y.o. Sex: female Date of Birth: [birth year] MRN: [id number]. Admitting Physician: Rhetta Mura, DO [id number], Nino Glow, MD  Admit Date: 02/19/2021 Discharge date: 02/20/2021  Recommendations for Outpatient Follow-up:  Follow up with PCP in 1-2 weeks Please obtain CMP/CBC in one week Encourage complete abstinence from marijuana use  Admitted From:  Home  Disposition: [place]: No  Equipment/Devices: None  Discharge Condition: Stable  CODE STATUS: FULL CODE  Diet recommendation:  Diet Order             Diet - low sodium heart healthy           Diet Carb Modified           Diet regular Room service appropriate? Yes; Fluid consistency: Thin  Diet effective now                    Brief Summary: See H&P, Labs, Consult and Test reports for all details in brief, patient is a 55 year old female with history of DM-2, bipolar disorder, GERD, chronic abdominal pain, HTN-marijuana use who presented with nausea and vomiting.  She was subsequently admitted to the hospitalist service for further evaluation and treatment.  See below for further details.  Brief Hospital Course: Nausea and vomiting: Claims she has a history of underlying gastroparesis-probably worsened due to marijuana use.  Managed with supportive care-nausea/vomiting has completely resolved-tolerating diet.  Encouraged patient to stop using marijuana-she claims she will "think about it" and gradually wean herself off it.  She was placed on PPI and Reglan-on discharge she will continue with PPI-and will continue with Reglan as needed.  Note-CT abdomen was negative for any acute abnormalities.  Urine drug screen positive for opiates/cocaine/marijuana: She vehemently denies use of cocaine or opiates-acknowledges marijuana use.  She had a urine drug screen in 2019 that was positive for cocaine as well.  She has been counseled regarding  avoiding illicit substances extensively this morning.  Rest of her medical problems were stable during his short overnight hospitalization.  Interventions: Refer to RD note for recommendations  Obesity: Estimated body mass index is 33.78 kg/m as calculated from the following:   Height as of this encounter: '5\' 3"'$  (1.6 m).   Weight as of this encounter: 86.5 kg.      Procedures None  Discharge Diagnoses:  Principal Problem:   Intractable nausea and vomiting Active Problems:   Essential hypertension   Hyperlipidemia associated with type 2 diabetes mellitus (HCC)   Gastroesophageal reflux disease   Epigastric pain   SIRS (systemic inflammatory response syndrome) (HCC)   High anion gap metabolic acidosis   Elevated serum creatinine   Diabetes mellitus without complication [facility])   Discharge Instructions:  Activity:  As tolerated   Discharge Instructions     Call MD for:  persistant nausea and vomiting   Complete by: As directed    Diet - low sodium heart healthy   Complete by: As directed    Diet Carb Modified   Complete by: As directed    Discharge instructions   Complete by: As directed    Follow with Primary MD  McLean-Scocuzza, Nino Glow, MD in 1-2 weeks  Avoid further marijuana use  Consume small portion meals/gastroparesis diet.  Please get a complete blood count and chemistry panel checked by your Primary MD at your next visit, and again as instructed by your Primary MD.  Get Medicines reviewed and adjusted: Please take  all your medications with you for your next visit with your Primary MD  Laboratory/radiological data: Please request your Primary MD to go over all hospital tests and procedure/radiological results at the follow up, please ask your Primary MD to get all Hospital records sent to his/her office.  In some cases, they will be blood work, cultures and biopsy results pending at the time of your discharge. Please request that your primary care M.D.  follows up on these results.  Also Note the following: If you experience worsening of your admission symptoms, develop shortness of breath, life threatening emergency, suicidal or homicidal thoughts you must seek medical attention immediately by calling 911 or calling your MD immediately  if symptoms less severe.  You must read complete instructions/literature along with all the possible adverse reactions/side effects for all the Medicines you take and that have been prescribed to you. Take any new Medicines after you have completely understood and accpet all the possible adverse reactions/side effects.   Do not drive when taking Pain medications or sleeping medications (Benzodaizepines)  Do not take more than prescribed Pain, Sleep and Anxiety Medications. It is not advisable to combine anxiety,sleep and pain medications without talking with your primary care practitioner  Special Instructions: If you have smoked or chewed Tobacco  in the last 2 yrs please stop smoking, stop any regular Alcohol  and or any Recreational drug use.  Wear Seat belts while driving.  Please note: You were cared for by a hospitalist during your hospital stay. Once you are discharged, your primary care physician will handle any further medical issues. Please note that NO REFILLS for any discharge medications will be authorized once you are discharged, as it is imperative that you return to your primary care physician (or establish a relationship with a primary care physician if you do not have one) for your post hospital discharge needs so that they can reassess your need for medications and monitor your lab values.   Increase activity slowly   Complete by: As directed       Allergies as of 02/20/2021       Reactions   Ativan [lorazepam]    Pt states it makes her tongue do weird things    Augmentin [amoxicillin-pot Clavulanate]    Upset stomach   Latex Other (See Comments)   Patient stated that she was told by  her doctor that she is "allergic to" this   Tape Other (See Comments)   Patient stated that she was told by her doctor that she is "allergic to" this   Xifaxan [rifaximin]    Pt believes this is contributing to her abdominal pain/discomfort   Drixoral Allergy Sinus [dexbromphen-pse-apap Er] Rash        Medication List     TAKE these medications    albuterol 108 (90 Base) MCG/ACT inhaler Commonly known as: ProAir HFA Inhale 1-2 puffs into the lungs every 4 (four) hours as needed for wheezing or shortness of breath.   amLODipine 10 MG tablet Commonly known as: NORVASC Take 1 tablet (10 mg total) by mouth at bedtime.   aspirin 81 MG chewable tablet Chew 81 mg by mouth daily.   atorvastatin 80 MG tablet Commonly known as: LIPITOR Take 1 tablet (80 mg total) by mouth daily at 6 PM.   Breztri Aerosphere 160-9-4.8 MCG/ACT Aero Generic drug: Budeson-Glycopyrrol-Formoterol Inhale 2 puffs into the lungs in the morning and at bedtime. Rinse   clopidogrel 75 MG tablet Commonly known as: PLAVIX Take  1 tablet (75 mg total) by mouth daily.   divalproex 500 MG DR tablet Commonly known as: DEPAKOTE TAKE 1 TABLET BY MOUTH  TWICE DAILY   ezetimibe 10 MG tablet Commonly known as: ZETIA TAKE 1 TABLET BY MOUTH  DAILY WITH LIPITOR 80 MG AT NIGHT   icosapent Ethyl 1 g capsule Commonly known as: Vascepa Take 2 capsules (2 g total) by mouth 2 (two) times daily.   isosorbide mononitrate 60 MG 24 hr tablet Commonly known as: IMDUR Take 1 tablet (60 mg total) by mouth in the morning and at bedtime.   methocarbamol 500 MG tablet Commonly known as: Robaxin Take 1 tablet (500 mg total) by mouth 2 (two) times daily as needed for muscle spasms.   metoCLOPramide 10 MG tablet Commonly known as: REGLAN Take 1 tablet (10 mg total) by mouth every 8 (eight) hours as needed for nausea.   nitroGLYCERIN 0.4 MG SL tablet Commonly known as: NITROSTAT DISSOLVE 1 TABLET UNDER  TONGUE EVERY 5 MINUTES  AS  NEEDED FOR CHEST PAIN MAX  OF 3 TABLETS IN 15 MINUTES  . CALL 911 AFTER THIRD DOSE   olmesartan-hydrochlorothiazide 20-12.5 MG tablet Commonly known as: BENICAR HCT Take 0.5 tablets by mouth daily. If  BP >130/>80 may increase to 1 pill qam. Stop 40-25 mg dose   pantoprazole 40 MG tablet Commonly known as: PROTONIX TAKE 1 TABLET BY MOUTH  TWICE DAILY BEFORE MEALS   traMADol 50 MG tablet Commonly known as: ULTRAM Take 1 tablet (50 mg total) by mouth every 6 (six) hours as needed for moderate pain.   traZODone 150 MG tablet Commonly known as: DESYREL Take 1 tablet (150 mg total) by mouth at bedtime as needed for sleep.   Vitamin D (Cholecalciferol) 25 MCG (1000 UT) Caps Take 1 capsule by mouth daily.        Follow-up Information     McLean-Scocuzza, Nino Glow, MD. Schedule an appointment as soon as possible for a visit in 1 week(s).   Specialty: Internal Medicine Contact information: [place] [address] [phone]         End, Harrell Gave, MD Follow up in 1 month(s).   Specialty: Cardiology Contact information: [place] [address] [phone]                Allergies  Allergen Reactions   Ativan [Lorazepam]     Pt states it makes her tongue do weird things    Augmentin [Amoxicillin-Pot Clavulanate]     Upset stomach    Latex Other (See Comments)    Patient stated that she was told by her doctor that she is "allergic to" this   Tape Other (See Comments)    Patient stated that she was told by her doctor that she is "allergic to" this   Xifaxan [Rifaximin]     Pt believes this is contributing to her abdominal pain/discomfort   Drixoral Allergy Sinus [Dexbromphen-Pse-Apap Er] Rash      Consultations: None   Other Procedures/Studies: CT ABDOMEN PELVIS WO CONTRAST  Result Date: 02/10/2021 CLINICAL DATA:  Abdominal pain. Nausea and vomiting. EXAM: CT ABDOMEN AND PELVIS WITHOUT CONTRAST  TECHNIQUE: Multidetector CT imaging of the abdomen and pelvis was performed following the standard protocol without IV contrast. COMPARISON:  01/06/2021 FINDINGS: Lower chest: No acute findings. Hepatobiliary: No mass visualized on this unenhanced exam. Gallbladder is unremarkable. No evidence of biliary ductal dilatation. Pancreas: No mass or inflammatory process visualized on this  unenhanced exam. Spleen:  Within normal limits in size. Adrenals/Urinary tract: No evidence of urolithiasis or hydronephrosis. Urinary bladder is empty. Stomach/Bowel: No evidence of obstruction, inflammatory process, or abnormal fluid collections. Surgical anastomosis again seen in the sigmoid colon. Vascular/Lymphatic: No pathologically enlarged lymph nodes identified. No evidence of abdominal aortic aneurysm. Aortic atherosclerotic calcification noted. Reproductive: Prior hysterectomy noted. Adnexal regions are unremarkable in appearance. Other:  None. Musculoskeletal:  No suspicious bone lesions identified. IMPRESSION: No acute findings or other significant abnormality identified. Aortic Atherosclerosis (ICD10-I70.0). Electronically Signed   By: Marlaine Hind M.D.   On: 02/10/2021 17:48   CT ABDOMEN PELVIS W CONTRAST  Result Date: 02/19/2021 CLINICAL DATA:  Persistent abdominal pain nausea and vomiting. EXAM: CT ABDOMEN AND PELVIS WITH CONTRAST TECHNIQUE: Multidetector CT imaging of the abdomen and pelvis was performed using the standard protocol following bolus administration of intravenous contrast. CONTRAST:  158m OMNIPAQUE IOHEXOL 350 MG/ML SOLN COMPARISON:  CT February 10, 2021 FINDINGS: Despite efforts by the technologist and patient, motion artifact is present on today's exam and could not be eliminated. This reduces exam sensitivity and specificity. Lower chest: No acute abnormality. Hepatobiliary: No focal liver abnormality is seen. No gallstones, gallbladder wall thickening, or biliary dilatation. Pancreas: Unremarkable. No  pancreatic ductal dilatation or surrounding inflammatory changes. Spleen: Normal in size without focal abnormality. Adrenals/Urinary Tract: Adrenal glands are unremarkable. Kidneys are normal, without renal calculi, solid enhancing lesion, or hydronephrosis. Bladder is unremarkable for degree of distension. Stomach/Bowel: Low-density wall thickening of the distal esophagus extending to the GE junction. Stomach is unremarkable for degree of distension. No pathologic dilation of small bowel. There are few scattered colonic diverticula without findings of acute diverticulitis. Sigmoid colonic anastomotic sutures. Vascular/Lymphatic: Aortic atherosclerosis without aneurysmal dilation. No pathologically enlarged abdominal or pelvic lymph nodes. Reproductive: Status post hysterectomy. No adnexal masses. Other: No abdominopelvic ascites. Musculoskeletal: Mild lower lumbar spondylosis. No acute osseous abnormality. IMPRESSION: 1. Low-density wall thickening of the distal esophagus extending to the GE junction may represent esophagitis. 2. Scattered colonic diverticula without findings of acute diverticulitis. 3.  Aortic Atherosclerosis (ICD10-I70.0). Electronically Signed   By: JDahlia BailiffMD   On: 02/19/2021 15:44   DG Chest Port 1 View  Result Date: 02/19/2021 CLINICAL DATA:  Leukocytosis EXAM: PORTABLE CHEST 1 VIEW COMPARISON:  Radiograph 02/10/2021 FINDINGS: Accounting for body habitus, the lungs are clear. No consolidation, features of edema, pneumothorax, or effusion. Pulmonary vascularity is normally distributed. The cardiomediastinal contours are unremarkable. No acute osseous or soft tissue abnormality. Telemetry leads overlie the chest. IMPRESSION: No acute cardiopulmonary abnormality. Electronically Signed   By: PLovena LeM.D.   On: 02/19/2021 19:43   DG Chest Port 1 View  Result Date: 02/10/2021 CLINICAL DATA:  Questionable sepsis.  Evaluate for abnormality. EXAM: PORTABLE CHEST 1 VIEW COMPARISON:   May 22, 2020 FINDINGS: The heart size and mediastinal contours are within normal limits. Both lungs are clear. The visualized skeletal structures are unremarkable. IMPRESSION: No active disease. Electronically Signed   By: DDorise BullionIII M.D   On: 02/10/2021 17:59   CT Angio Abd/Pel W and/or Wo Contrast  Result Date: 02/10/2021 CLINICAL DATA:  Left upper quadrant abdominal pain.  Vomiting. EXAM: CTA ABDOMEN AND PELVIS WITHOUT AND WITH CONTRAST TECHNIQUE: Multidetector CT imaging of the abdomen and pelvis was performed using the standard protocol during bolus administration of intravenous contrast. Multiplanar reconstructed images and MIPs were obtained and reviewed to evaluate the vascular anatomy. CONTRAST:  1071mOMNIPAQUE IOHEXOL 350 MG/ML  SOLN COMPARISON:  CT abdomen pelvis earlier same day. FINDINGS: VASCULAR Aorta: Normal caliber abdominal aorta. Peripheral calcified atherosclerotic plaque. Celiac: Patent.  No evidence for aneurysm or dissection. SMA: Unremarkable Renals: Mild atherosclerotic narrowing at the origin of the right left renal arteries. IMA: Patent Inflow: Patent without evidence of aneurysm, dissection, vasculitis or significant stenosis. Proximal Outflow: Bilateral common femoral and visualized portions of the superficial and profunda femoral arteries are patent without evidence of aneurysm, dissection, vasculitis or significant stenosis. Veins: No obvious venous abnormality within the limitations of this arterial phase study. Review of the MIP images confirms the above findings. NON-VASCULAR Lower chest: Normal heart size.  Lung bases are clear. Hepatobiliary: Liver is normal in size and contour. No focal lesion identified. Gallbladder is unremarkable. No intrahepatic or extrahepatic biliary ductal dilatation. Pancreas: Unremarkable Spleen: Unremarkable Adrenals/Urinary Tract: Normal adrenal glands. Kidneys enhance symmetrically with contrast. No hydronephrosis. Urinary bladder is  unremarkable. Stomach/Bowel: Postsurgical changes involving the sigmoid colon. There is mild circumferential wall thickening of the descending colon. No evidence for free fluid or free intraperitoneal air. No evidence for bowel obstruction. Normal morphology of the stomach. Lymphatic: Normal caliber abdominal aorta. Peripheral calcified atherosclerotic plaque. No retroperitoneal lymphadenopathy. Reproductive: Prior hysterectomy. Other: None. Musculoskeletal: No aggressive or acute appearing osseous lesions. Lower thoracic lumbar spine degenerative changes. IMPRESSION: VASCULAR No acute vascular process. NON-VASCULAR There is mild circumferential wall thickening of the descending colon which may be secondary to colitis or diverticulitis. Otherwise no acute process within the abdomen or pelvis. Electronically Signed   By: Lovey Newcomer M.D.   On: 02/10/2021 19:20     TODAY-DAY OF DISCHARGE:  Subjective:   [facility] today has no headache,no chest abdominal pain,no new weakness tingling or numbness, feels much better wants to go home today.   Objective:   Blood pressure (!) 130/91, pulse 79, temperature 98.1 F (36.7 C), temperature source Oral, resp. rate 16, height '5\' 3"'$  (1.6 m), weight 86.5 kg, SpO2 100 %.  Intake/Output Summary (Last 24 hours) at 02/20/2021 1451 Last data filed at 02/20/2021 1137 Gross per 24 hour  Intake 2377.68 ml  Output --  Net 2377.68 ml    Filed Weights   02/19/21 1249 02/19/21 2000  Weight: 86 kg 86.5 kg    Exam: Awake Alert, Oriented *3, No new F.N deficits, Normal affect [place].AT,PERRAL Supple Neck,No JVD, No cervical lymphadenopathy appriciated.  Symmetrical Chest wall movement, Good air movement bilaterally, CTAB RRR,No Gallops,Rubs or new Murmurs, No Parasternal Heave +ve B.Sounds, Abd Soft, Non tender, No organomegaly appriciated, No rebound -guarding or rigidity. No Cyanosis, Clubbing or edema, No new Rash or bruise   PERTINENT RADIOLOGIC  STUDIES: CT ABDOMEN PELVIS W CONTRAST  Result Date: 02/19/2021 CLINICAL DATA:  Persistent abdominal pain nausea and vomiting. EXAM: CT ABDOMEN AND PELVIS WITH CONTRAST TECHNIQUE: Multidetector CT imaging of the abdomen and pelvis was performed using the standard protocol following bolus administration of intravenous contrast. CONTRAST:  115m OMNIPAQUE IOHEXOL 350 MG/ML SOLN COMPARISON:  CT February 10, 2021 FINDINGS: Despite efforts by the technologist and patient, motion artifact is present on today's exam and could not be eliminated. This reduces exam sensitivity and specificity. Lower chest: No acute abnormality. Hepatobiliary: No focal liver abnormality is seen. No gallstones, gallbladder wall thickening, or biliary dilatation. Pancreas: Unremarkable. No pancreatic ductal dilatation or surrounding inflammatory changes. Spleen: Normal in size without focal abnormality. Adrenals/Urinary Tract: Adrenal glands are unremarkable. Kidneys are normal, without renal calculi, solid enhancing lesion, or hydronephrosis. Bladder is unremarkable  for degree of distension. Stomach/Bowel: Low-density wall thickening of the distal esophagus extending to the GE junction. Stomach is unremarkable for degree of distension. No pathologic dilation of small bowel. There are few scattered colonic diverticula without findings of acute diverticulitis. Sigmoid colonic anastomotic sutures. Vascular/Lymphatic: Aortic atherosclerosis without aneurysmal dilation. No pathologically enlarged abdominal or pelvic lymph nodes. Reproductive: Status post hysterectomy. No adnexal masses. Other: No abdominopelvic ascites. Musculoskeletal: Mild lower lumbar spondylosis. No acute osseous abnormality. IMPRESSION: 1. Low-density wall thickening of the distal esophagus extending to the GE junction may represent esophagitis. 2. Scattered colonic diverticula without findings of acute diverticulitis. 3.  Aortic Atherosclerosis (ICD10-I70.0). Electronically  Signed   By: Dahlia Bailiff MD   On: 02/19/2021 15:44   DG Chest Port 1 View  Result Date: 02/19/2021 CLINICAL DATA:  Leukocytosis EXAM: PORTABLE CHEST 1 VIEW COMPARISON:  Radiograph 02/10/2021 FINDINGS: Accounting for body habitus, the lungs are clear. No consolidation, features of edema, pneumothorax, or effusion. Pulmonary vascularity is normally distributed. The cardiomediastinal contours are unremarkable. No acute osseous or soft tissue abnormality. Telemetry leads overlie the chest. IMPRESSION: No acute cardiopulmonary abnormality. Electronically Signed   By: Lovena Le M.D.   On: 02/19/2021 19:43     PERTINENT LAB RESULTS: CBC: Recent Labs    02/19/21 1252 02/20/21 0420  WBC 18.0* 12.9*  HGB 13.8 11.1*  HCT 39.4 32.9*  PLT 569* 406*    CMET CMP     Component Value Date/Time   NA 139 02/20/2021 0420   NA 140 10/26/2014 1334   K 3.3 (L) 02/20/2021 0420   K 3.7 10/26/2014 1334   CL 102 02/20/2021 0420   CL 106 10/26/2014 1334   CO2 28 02/20/2021 0420   CO2 27 10/26/2014 1334   GLUCOSE 125 (H) 02/20/2021 0420   GLUCOSE 96 10/26/2014 1334   BUN 14 02/20/2021 0420   BUN 17 10/26/2014 1334   CREATININE 0.99 02/20/2021 0420   CREATININE 0.74 12/31/2017 0905   CALCIUM 8.3 (L) 02/20/2021 0420   CALCIUM 9.3 10/26/2014 1334   PROT 6.1 (L) 02/20/2021 0420   PROT 7.6 10/26/2014 1334   ALBUMIN 3.4 (L) 02/20/2021 0420   ALBUMIN 4.3 10/26/2014 1334   AST 26 02/20/2021 0420   AST 24 10/26/2014 1334   ALT 19 02/20/2021 0420   ALT 35 10/26/2014 1334   ALKPHOS 42 02/20/2021 0420   ALKPHOS 90 10/26/2014 1334   BILITOT 0.6 02/20/2021 0420   BILITOT 0.7 10/26/2014 1334   GFRNONAA >60 02/20/2021 0420   GFRNONAA >60 10/26/2014 1334   GFRAA >60 09/21/2019 0927   GFRAA >60 10/26/2014 1334    GFR Estimated Creatinine Clearance: 68.5 mL/min (by C-G formula based on SCr of 0.99 mg/dL). Recent Labs    02/19/21 1252  LIPASE 60*    No results for input(s): CKTOTAL, CKMB,  CKMBINDEX, TROPONINI in the last 72 hours. Invalid input(s): POCBNP No results for input(s): DDIMER in the last 72 hours. Recent Labs    02/19/21 1252  HGBA1C 6.2*    No results for input(s): CHOL, HDL, LDLCALC, TRIG, CHOLHDL, LDLDIRECT in the last 72 hours. Recent Labs    02/19/21 2006  TSH 3.052    No results for input(s): VITAMINB12, FOLATE, FERRITIN, TIBC, IRON, RETICCTPCT in the last 72 hours. Coags: No results for input(s): INR in the last 72 hours.  Invalid input(s): PT Microbiology: Recent Results (from the past 240 hour(s))  Urine Culture     Status: None   Collection Time: 02/10/21  3:51 PM   Specimen: In/Out Cath Urine  Result Value Ref Range Status   Specimen Description   Final    IN/OUT CATH URINE Performed at [facility], [address], [place], [place] [address]    Special Requests   Final    NONE Performed at [facility] [address] [place], [place] [address]    Culture   Final    NO GROWTH Performed at [facility], [place] [address] [place], [place] [address]    Report Status 02/12/2021 FINAL  Final  Blood culture (routine single)     Status: Abnormal   Collection Time: 02/10/21  5:19 PM   Specimen: BLOOD  Result Value Ref Range Status   Specimen Description   Final    BLOOD LFA Performed at [facility], [address] [place], [place] [address]    Special Requests   Final    BOTTLES DRAWN AEROBIC AND ANAEROBIC Blood Culture results may not be optimal due to an inadequate volume of blood received in culture bottles Performed at [facility], [address] [place], [place] [address]    Culture  Setup Time   Final    GRAM POSITIVE COCCI ANAEROBIC BOTTLE ONLY Organism ID to follow CRITICAL RESULT CALLED TO, READ BACK BY AND VERIFIED WITH: Rito Ehrlich, PHARMD AT 1108 ON 02/11/21 BY GM Performed at [facility] [facility], [address] [place], [place] [address]    Culture (A)  Final     STAPHYLOCOCCUS EPIDERMIDIS THE SIGNIFICANCE OF ISOLATING THIS ORGANISM FROM A SINGLE VENIPUNCTURE CANNOT BE PREDICTED WITHOUT FURTHER CLINICAL AND CULTURE CORRELATION. SUSCEPTIBILITIES AVAILABLE ONLY ON REQUEST. Performed at [facility], [place] [address] [place],  [address]    Report Status 02/13/2021 FINAL  Final  Blood Culture ID Panel (Reflexed)     Status: Abnormal   Collection Time: 02/10/21  5:19 PM  Result Value Ref Range Status   Enterococcus faecalis NOT DETECTED NOT DETECTED Final   Enterococcus Faecium NOT DETECTED NOT DETECTED Final   Listeria monocytogenes NOT DETECTED NOT DETECTED Final   Staphylococcus species DETECTED (A) NOT DETECTED Final    Comment: CRITICAL RESULT CALLED TO, READ BACK BY AND VERIFIED WITH: Rito Ehrlich, PHARMD AT 1108 ON 02/11/21 BY GM    Staphylococcus aureus (BCID) NOT DETECTED NOT DETECTED Final   Staphylococcus epidermidis DETECTED (A) NOT DETECTED Final    Comment: Methicillin (oxacillin) resistant coagulase negative staphylococcus. Possible blood culture contaminant (unless isolated from more than one blood culture draw or clinical case suggests pathogenicity). No antibiotic treatment is indicated for blood  culture contaminants. CRITICAL RESULT CALLED TO, READ BACK BY AND VERIFIED WITH: Rito Ehrlich, PHARMD AT 1108 ON 02/11/21 BY GM    Staphylococcus lugdunensis NOT DETECTED NOT DETECTED Final   Streptococcus species NOT DETECTED NOT DETECTED Final   Streptococcus agalactiae NOT DETECTED NOT DETECTED Final   Streptococcus pneumoniae NOT DETECTED NOT DETECTED Final   Streptococcus pyogenes NOT DETECTED NOT DETECTED Final   A.calcoaceticus-baumannii NOT DETECTED NOT DETECTED Final   Bacteroides fragilis NOT DETECTED NOT DETECTED Final   Enterobacterales NOT DETECTED NOT DETECTED Final   Enterobacter cloacae complex NOT DETECTED NOT DETECTED Final   Escherichia coli NOT DETECTED NOT DETECTED Final   Klebsiella aerogenes NOT DETECTED  NOT DETECTED Final   Klebsiella oxytoca NOT DETECTED NOT DETECTED Final   Klebsiella pneumoniae NOT DETECTED NOT DETECTED Final   Proteus species NOT DETECTED NOT DETECTED Final  Salmonella species NOT DETECTED NOT DETECTED Final   Serratia marcescens NOT DETECTED NOT DETECTED Final   Haemophilus influenzae NOT DETECTED NOT DETECTED Final   Neisseria meningitidis NOT DETECTED NOT DETECTED Final   Pseudomonas aeruginosa NOT DETECTED NOT DETECTED Final   Stenotrophomonas maltophilia NOT DETECTED NOT DETECTED Final   Candida albicans NOT DETECTED NOT DETECTED Final   Candida auris NOT DETECTED NOT DETECTED Final   Candida glabrata NOT DETECTED NOT DETECTED Final   Candida krusei NOT DETECTED NOT DETECTED Final   Candida parapsilosis NOT DETECTED NOT DETECTED Final   Candida tropicalis NOT DETECTED NOT DETECTED Final   Cryptococcus neoformans/gattii NOT DETECTED NOT DETECTED Final   Methicillin resistance mecA/C DETECTED (A) NOT DETECTED Final    Comment: CRITICAL RESULT CALLED TO, READ BACK BY AND VERIFIED WITH: Rito Ehrlich, PHARMD AT 1108 ON 02/11/21 BY GM Performed at [facility], [address] [place], [place] [address]   Resp Panel by RT-PCR (Flu A&B, Covid) Nasopharyngeal Swab     Status: None   Collection Time: 02/11/21 10:11 AM   Specimen: Nasopharyngeal Swab; Nasopharyngeal(NP) swabs in vial transport medium  Result Value Ref Range Status   SARS Coronavirus 2 by RT PCR NEGATIVE NEGATIVE Final    Comment: (NOTE) SARS-CoV-2 target nucleic acids are NOT DETECTED.  The SARS-CoV-2 RNA is generally detectable in upper respiratory specimens during the acute phase of infection. The lowest concentration of SARS-CoV-2 viral copies this assay can detect is 138 copies/mL. A negative result does not preclude SARS-Cov-2 infection and should not be used as the sole basis for treatment or other patient management decisions. A negative result may occur with  improper specimen  collection/handling, submission of specimen other than nasopharyngeal swab, presence of viral mutation(s) within the areas targeted by this assay, and inadequate number of viral copies(<138 copies/mL). A negative result must be combined with clinical observations, patient history, and epidemiological information. The expected result is Negative.  Fact Sheet for Patients:  EntrepreneurPulse.com.au  Fact Sheet for Healthcare Providers:  IncredibleEmployment.be  This test is no t yet approved or cleared by the [place] FDA and  has been authorized for detection and/or diagnosis of SARS-CoV-2 by FDA under an Emergency Use Authorization (EUA). This EUA will remain  in effect (meaning this test can be used) for the duration of the COVID-19 declaration under Section 564(b)(1) of the Act, 21 U.S.C.section 360bbb-3(b)(1), unless the authorization is terminated  or revoked sooner.       Influenza A by PCR NEGATIVE NEGATIVE Final   Influenza B by PCR NEGATIVE NEGATIVE Final    Comment: (NOTE) The Xpert Xpress SARS-CoV-2/FLU/RSV plus assay is intended as an aid in the diagnosis of influenza from Nasopharyngeal swab specimens and should not be used as a sole basis for treatment. Nasal washings and aspirates are unacceptable for Xpert Xpress SARS-CoV-2/FLU/RSV testing.  Fact Sheet for Patients: EntrepreneurPulse.com.au  Fact Sheet for Healthcare Providers: IncredibleEmployment.be  This test is not yet approved or cleared by the [place] FDA and has been authorized for detection and/or diagnosis of SARS-CoV-2 by FDA under an Emergency Use Authorization (EUA). This EUA will remain in effect (meaning this test can be used) for the duration of the COVID-19 declaration under Section 564(b)(1) of the Act, 21 U.S.C. section 360bbb-3(b)(1), unless the authorization is terminated or revoked.  Performed at [facility], [address] [place], [place] [address]   Resp Panel by RT-PCR (Flu A&B, Covid) Nasopharyngeal Swab  Status: None   Collection Time: 02/19/21  4:50 PM   Specimen: Nasopharyngeal Swab; Nasopharyngeal(NP) swabs in vial transport medium  Result Value Ref Range Status   SARS Coronavirus 2 by RT PCR NEGATIVE NEGATIVE Final    Comment: (NOTE) SARS-CoV-2 target nucleic acids are NOT DETECTED.  The SARS-CoV-2 RNA is generally detectable in upper respiratory specimens during the acute phase of infection. The lowest concentration of SARS-CoV-2 viral copies this assay can detect is 138 copies/mL. A negative result does not preclude SARS-Cov-2 infection and should not be used as the sole basis for treatment or other patient management decisions. A negative result may occur with  improper specimen collection/handling, submission of specimen other than nasopharyngeal swab, presence of viral mutation(s) within the areas targeted by this assay, and inadequate number of viral copies(<138 copies/mL). A negative result must be combined with clinical observations, patient history, and epidemiological information. The expected result is Negative.  Fact Sheet for Patients:  EntrepreneurPulse.com.au  Fact Sheet for Healthcare Providers:  IncredibleEmployment.be  This test is no t yet approved or cleared by the [place] FDA and  has been authorized for detection and/or diagnosis of SARS-CoV-2 by FDA under an Emergency Use Authorization (EUA). This EUA will remain  in effect (meaning this test can be used) for the duration of the COVID-19 declaration under Section 564(b)(1) of the Act, 21 U.S.C.section 360bbb-3(b)(1), unless the authorization is terminated  or revoked sooner.       Influenza A by PCR NEGATIVE NEGATIVE Final   Influenza B by PCR NEGATIVE NEGATIVE Final    Comment: (NOTE) The Xpert Xpress SARS-CoV-2/FLU/RSV plus assay is  intended as an aid in the diagnosis of influenza from Nasopharyngeal swab specimens and should not be used as a sole basis for treatment. Nasal washings and aspirates are unacceptable for Xpert Xpress SARS-CoV-2/FLU/RSV testing.  Fact Sheet for Patients: EntrepreneurPulse.com.au  Fact Sheet for Healthcare Providers: IncredibleEmployment.be  This test is not yet approved or cleared by the [place] FDA and has been authorized for detection and/or diagnosis of SARS-CoV-2 by FDA under an Emergency Use Authorization (EUA). This EUA will remain in effect (meaning this test can be used) for the duration of the COVID-19 declaration under Section 564(b)(1) of the Act, 21 U.S.C. section 360bbb-3(b)(1), unless the authorization is terminated or revoked.  Performed at [facility], [address] [place], [place] [address]     FURTHER DISCHARGE INSTRUCTIONS:  Get Medicines reviewed and adjusted: Please take all your medications with you for your next visit with your Primary MD  Laboratory/radiological data: Please request your Primary MD to go over all hospital tests and procedure/radiological results at the follow up, please ask your Primary MD to get all Hospital records sent to his/her office.  In some cases, they will be blood work, cultures and biopsy results pending at the time of your discharge. Please request that your primary care M.D. goes through all the records of your hospital data and follows up on these results.  Also Note the following: If you experience worsening of your admission symptoms, develop shortness of breath, life threatening emergency, suicidal or homicidal thoughts you must seek medical attention immediately by calling 911 or calling your MD immediately  if symptoms less severe.  You must read complete instructions/literature along with all the possible adverse reactions/side effects for all the Medicines you  take and that have been prescribed to you. Take any new Medicines after you have completely understood and accpet all the possible  adverse reactions/side effects.   Do not drive when taking Pain medications or sleeping medications (Benzodaizepines)  Do not take more than prescribed Pain, Sleep and Anxiety Medications. It is not advisable to combine anxiety,sleep and pain medications without talking with your primary care practitioner  Special Instructions: If you have smoked or chewed Tobacco  in the last 2 yrs please stop smoking, stop any regular Alcohol  and or any Recreational drug use.  Wear Seat belts while driving.  Please note: You were cared for by a hospitalist during your hospital stay. Once you are discharged, your primary care physician will handle any further medical issues. Please note that NO REFILLS for any discharge medications will be authorized once you are discharged, as it is imperative that you return to your primary care physician (or establish a relationship with a primary care physician if you do not have one) for your post hospital discharge needs so that they can reassess your need for medications and monitor your lab values.  Total Time spent coordinating discharge including counseling, education and face to face time equals 25  minutes.  SignedOren Binet 02/20/2021 2:51 PM

## 2021-02-21 ENCOUNTER — Telehealth: Payer: Self-pay

## 2021-02-21 ENCOUNTER — Ambulatory Visit: Payer: Medicare Other | Admitting: Podiatry

## 2021-02-21 NOTE — Telephone Encounter (Signed)
Transition Care Management Unsuccessful Follow-up Telephone Call  Date of discharge and from where:  02/20/21 from [facility]  Attempts:  1st Attempt  Reason for unsuccessful TCM follow-up call:  Left voice message

## 2021-02-22 NOTE — Telephone Encounter (Signed)
Pt called retuning your call

## 2021-02-22 NOTE — Telephone Encounter (Signed)
Transition Care Management Unsuccessful Follow-up Telephone Call  Date of discharge and from where:  02/20/21 from [facility]  Attempts:  2nd Attempt  Reason for unsuccessful TCM follow-up call:  Left voice message

## 2021-02-23 NOTE — Telephone Encounter (Signed)
Did this eye appointment occur?

## 2021-02-25 ENCOUNTER — Telehealth: Payer: Self-pay | Admitting: Internal Medicine

## 2021-02-25 NOTE — Telephone Encounter (Signed)
Pt notified of results

## 2021-02-25 NOTE — Telephone Encounter (Signed)
Reviewed retinal scan.  Reports is normal study and return in 12 months Please notify pt

## 2021-02-25 NOTE — Telephone Encounter (Signed)
Appointment was completed

## 2021-02-25 NOTE — Telephone Encounter (Signed)
Received patient Retinal Scan from [facility] forwarding to [facility] of day since provider out of office. Please results and notify patient once complteed abstract to chart.

## 2021-02-26 ENCOUNTER — Telehealth: Payer: Self-pay | Admitting: Family

## 2021-02-26 NOTE — Telephone Encounter (Signed)
Call patient Received iris retinal scan result which was a normal study.  Return for follow-up exam in 12 months.

## 2021-02-27 NOTE — Telephone Encounter (Signed)
Transition Care Management Unsuccessful Follow-up Telephone Call  Date of discharge and from where:  02/20/21 from [facility]  Attempts:  3rd Attempt  Reason for unsuccessful TCM follow-up call:  Left voice message

## 2021-02-28 ENCOUNTER — Encounter: Payer: Self-pay | Admitting: Podiatry

## 2021-02-28 ENCOUNTER — Ambulatory Visit (INDEPENDENT_AMBULATORY_CARE_PROVIDER_SITE_OTHER): Payer: Medicare Other | Admitting: Podiatry

## 2021-02-28 ENCOUNTER — Ambulatory Visit (INDEPENDENT_AMBULATORY_CARE_PROVIDER_SITE_OTHER): Payer: Medicare Other

## 2021-02-28 ENCOUNTER — Other Ambulatory Visit: Payer: Self-pay | Admitting: Podiatry

## 2021-02-28 ENCOUNTER — Other Ambulatory Visit: Payer: Self-pay

## 2021-02-28 DIAGNOSIS — M722 Plantar fascial fibromatosis: Secondary | ICD-10-CM | POA: Diagnosis not present

## 2021-02-28 NOTE — Progress Notes (Signed)
Subjective:  Patient ID: Shawna Anderson, female    DOB: Sep 23, 1965,  MRN: [id number]  Chief Complaint  Patient presents with   Plantar Fasciitis    Bilateral foot pain    55 y.o. female presents with the above complaint.  Patient presents with complaint of bilateral heel pain that has been going for quite some time.  Patient had a heel spur surgery done to the left foot in [place].  She states that it has not gotten better her pain is very painful still.  She cannot stand on her foot for longer than an hour.  She has not done any bracing.  She had orthotics made in the past that did not help.  She denies any other acute complaints she has not seen anyone prior to seeing me for greater than 5 years.  She would like to discuss treatment options for this.   Review of Systems: Negative except as noted in the HPI. Denies N/V/F/Ch.  Past Medical History:  Diagnosis Date   Anxiety    Asthma    Bipolar 1 disorder ([place])    Bipolar disorder ([place])    CAD (coronary artery disease)    s/p stent BMS OM Cx   Cervical herniated disc 04/12/2016   COPD (chronic obstructive pulmonary disease) (HCC)    Depression    Diabetes mellitus without complication (HCC)    Diverticulitis    GERD (gastroesophageal reflux disease)    Glaucoma    History of blood transfusion    Hyperlipidemia    Hypertension    OSA (obstructive sleep apnea)    not using cpap    Plantar fasciitis    b/l feet s/p steroid shots w/o help and left surgery w/o help    UTI (urinary tract infection)     Current Outpatient Medications:    albuterol (PROAIR HFA) 108 (90 Base) MCG/ACT inhaler, Inhale 1-2 puffs into the lungs every 4 (four) hours as needed for wheezing or shortness of breath., Disp: 54 g, Rfl: 3   amLODipine (NORVASC) 10 MG tablet, Take 1 tablet (10 mg total) by mouth at bedtime., Disp: 90 tablet, Rfl: 3   aspirin 81 MG chewable tablet, Chew 81 mg by mouth daily., Disp: , Rfl:    atorvastatin (LIPITOR) 80 MG  tablet, Take 1 tablet (80 mg total) by mouth daily at 6 PM., Disp: 90 tablet, Rfl: 3   Budeson-Glycopyrrol-Formoterol (BREZTRI AEROSPHERE) 160-9-4.8 MCG/ACT AERO, Inhale 2 puffs into the lungs in the morning and at bedtime. Rinse, Disp: 32.1 g, Rfl: 3   clopidogrel (PLAVIX) 75 MG tablet, Take 1 tablet (75 mg total) by mouth daily., Disp: 90 tablet, Rfl: 3   divalproex (DEPAKOTE) 500 MG DR tablet, TAKE 1 TABLET BY MOUTH  TWICE DAILY, Disp: 180 tablet, Rfl: 3   ezetimibe (ZETIA) 10 MG tablet, TAKE 1 TABLET BY MOUTH  DAILY WITH LIPITOR 80 MG AT NIGHT, Disp: 90 tablet, Rfl: 3   icosapent Ethyl (VASCEPA) 1 g capsule, Take 2 capsules (2 g total) by mouth 2 (two) times daily., Disp: 360 capsule, Rfl: 3   isosorbide mononitrate (IMDUR) 60 MG 24 hr tablet, Take 1 tablet (60 mg total) by mouth in the morning and at bedtime., Disp: 180 tablet, Rfl: 3   methocarbamol (ROBAXIN) 500 MG tablet, Take 1 tablet (500 mg total) by mouth 2 (two) times daily as needed for muscle spasms., Disp: 180 tablet, Rfl: 1   metoCLOPramide (REGLAN) 10 MG tablet, Take 1 tablet (10 mg total) by  mouth every 8 (eight) hours as needed for nausea., Disp: 15 tablet, Rfl: 0   nitroGLYCERIN (NITROSTAT) 0.4 MG SL tablet, DISSOLVE 1 TABLET UNDER  TONGUE EVERY 5 MINUTES AS  NEEDED FOR CHEST PAIN MAX  OF 3 TABLETS IN 15 MINUTES  . CALL 911 AFTER THIRD DOSE, Disp: 75 tablet, Rfl: 4   olmesartan-hydrochlorothiazide (BENICAR HCT) 20-12.5 MG tablet, Take 0.5 tablets by mouth daily. If  BP >130/>80 may increase to 1 pill qam. Stop 40-25 mg dose, Disp: 90 tablet, Rfl: 3   pantoprazole (PROTONIX) 40 MG tablet, TAKE 1 TABLET BY MOUTH  TWICE DAILY BEFORE MEALS, Disp: 180 tablet, Rfl: 3   traMADol (ULTRAM) 50 MG tablet, Take 1 tablet (50 mg total) by mouth every 6 (six) hours as needed for moderate pain., Disp: 20 tablet, Rfl: 0   traZODone (DESYREL) 150 MG tablet, Take 1 tablet (150 mg total) by mouth at bedtime as needed for sleep., Disp: 90 tablet, Rfl:  3   Vitamin D, Cholecalciferol, 25 MCG (1000 UT) CAPS, Take 1 capsule by mouth daily., Disp: , Rfl:  No current facility-administered medications for this visit.  Facility-Administered Medications Ordered in Other Visits:    albuterol (PROVENTIL) (2.5 MG/3ML) 0.083% nebulizer solution 2.5 mg, 2.5 mg, Nebulization, Once, Tyler Pita, MD  Social History   Tobacco Use  Smoking Status Former   Packs/day: 1.00   Years: 30.00   Pack years: 30.00   Types: Cigarettes   Quit date: 12/31/2020   Years since quitting: 0.1  Smokeless Tobacco Never  Tobacco Comments   Quit after being admitted on 6/20 01/17/2021    Allergies  Allergen Reactions   Ativan [Lorazepam]     Pt states it makes her tongue do weird things    Augmentin [Amoxicillin-Pot Clavulanate]     Upset stomach    Latex Other (See Comments)    Patient stated that she was told by her doctor that she is "allergic to" this   Tape Other (See Comments)    Patient stated that she was told by her doctor that she is "allergic to" this   Xifaxan [Rifaximin]     Pt believes this is contributing to her abdominal pain/discomfort   Drixoral Allergy Sinus [Dexbromphen-Pse-Apap Er] Rash   Objective:  There were no vitals filed for this visit. There is no height or weight on file to calculate BMI. Constitutional Well developed. Well nourished.  Vascular Dorsalis pedis pulses palpable bilaterally. Posterior tibial pulses palpable bilaterally. Capillary refill normal to all digits.  No cyanosis or clubbing noted. Pedal hair growth normal.  Neurologic Normal speech. Oriented to person, place, and time. Epicritic sensation to light touch grossly present bilaterally.  Dermatologic Nails well groomed and normal in appearance. No open wounds. No skin lesions.  Orthopedic: Normal joint ROM without pain or crepitus bilaterally. No visible deformities. Tender to palpation at the calcaneal tuber bilaterally. No pain with calcaneal  squeeze bilaterally. Ankle ROM diminished range of motion bilaterally. Silfverskiold Test: positive bilaterally.   Radiographs: Taken and reviewed. No acute fractures or dislocations. No evidence of stress fracture.  Plantar heel spur present. Posterior heel spur present.   Assessment:   1. Plantar fasciitis of right foot   2. Plantar fasciitis of left foot    Plan:  Patient was evaluated and treated and all questions answered.  Plantar Fasciitis, bilaterally - XR reviewed as above.  - Educated on icing and stretching. Instructions given.  - Injection delivered to the plantar fascia as  below. - DME: Plantar Fascial Brace - Pharmacologic management: None  Procedure: Injection Tendon/Ligament Location: Bilateral plantar fascia at the glabrous junction; medial approach. Skin Prep: alcohol Injectate: 0.5 cc 0.5% marcaine plain, 0.5 cc of 1% Lidocaine, 0.5 cc kenalog 10. Disposition: Patient tolerated procedure well. Injection site dressed with a band-aid.  No follow-ups on file.

## 2021-03-01 ENCOUNTER — Ambulatory Visit
Admission: RE | Admit: 2021-03-01 | Discharge: 2021-03-01 | Disposition: A | Discharge status: Home / Self Care | Payer: Medicare Other | Source: Ambulatory Visit | Attending: Internal Medicine | Admitting: Internal Medicine

## 2021-03-01 DIAGNOSIS — N6315 Unspecified lump in the right breast, overlapping quadrants: Secondary | ICD-10-CM | POA: Insufficient documentation

## 2021-03-01 DIAGNOSIS — Z1231 Encounter for screening mammogram for malignant neoplasm of breast: Secondary | ICD-10-CM

## 2021-03-01 DIAGNOSIS — R922 Inconclusive mammogram: Secondary | ICD-10-CM | POA: Diagnosis not present

## 2021-03-01 DIAGNOSIS — N6310 Unspecified lump in the right breast, unspecified quadrant: Secondary | ICD-10-CM | POA: Diagnosis present

## 2021-03-01 NOTE — Telephone Encounter (Signed)
Spoke with pt and informed her of her results. Pt gave a verbal understanding.

## 2021-03-04 ENCOUNTER — Telehealth: Payer: Self-pay

## 2021-03-04 DIAGNOSIS — G8929 Other chronic pain: Secondary | ICD-10-CM

## 2021-03-04 NOTE — Telephone Encounter (Signed)
Left message for patient to return call back.  

## 2021-03-04 NOTE — Telephone Encounter (Signed)
Access nurse instructed patient to be evaluated which she refused. They also gave at home care instructions.

## 2021-03-04 NOTE — Telephone Encounter (Signed)
Shawna Anderson.   Call patient.  Please reiterate for her safety for wheezing, shortness of breath and cough she needs to be seen in urgent care.  We do not have an appointment today.  If we have any appointments we can offer her, please certainly do.  If we do not, please reiterate for her own safety she needs to be evaluated in person at urgent care today   Also I can see in the notes that [name] provided with the phone number to call for Dr. Marella Bile office for pain management.  She did spoke with patient on 02/15/2021.  Does patient need phone number again?  [name]

## 2021-03-05 ENCOUNTER — Telehealth: Payer: Medicare Other | Admitting: Physician Assistant

## 2021-03-05 DIAGNOSIS — R059 Cough, unspecified: Secondary | ICD-10-CM

## 2021-03-05 MED ORDER — BENZONATATE 100 MG PO CAPS: 100.0000 mg | ORAL_CAPSULE | Freq: Three times a day (TID) | ORAL | 0 refills | Status: AC

## 2021-03-05 MED ORDER — DOXYCYCLINE HYCLATE 100 MG PO CAPS: 100.0000 mg | ORAL_CAPSULE | Freq: Two times a day (BID) | ORAL | 0 refills | Status: AC

## 2021-03-05 NOTE — Telephone Encounter (Signed)
Left message for patient to return call back.  

## 2021-03-05 NOTE — Telephone Encounter (Signed)
Patient has been notified and is currently going through [employer] visit.

## 2021-03-05 NOTE — Progress Notes (Signed)

## 2021-03-06 ENCOUNTER — Encounter: Payer: Self-pay | Admitting: Family Medicine

## 2021-03-06 ENCOUNTER — Telehealth (INDEPENDENT_AMBULATORY_CARE_PROVIDER_SITE_OTHER): Payer: Medicare Other | Admitting: Family Medicine

## 2021-03-06 DIAGNOSIS — J069 Acute upper respiratory infection, unspecified: Secondary | ICD-10-CM | POA: Diagnosis not present

## 2021-03-06 DIAGNOSIS — J441 Chronic obstructive pulmonary disease with (acute) exacerbation: Secondary | ICD-10-CM | POA: Diagnosis not present

## 2021-03-06 MED ORDER — PREDNISONE 20 MG PO TABS: 40.0000 mg | ORAL_TABLET | Freq: Every day | ORAL | 0 refills | Status: AC

## 2021-03-06 NOTE — Telephone Encounter (Signed)
Sounds good.  [facility] pain clinic is fine

## 2021-03-06 NOTE — Telephone Encounter (Signed)
Pt called and notified

## 2021-03-06 NOTE — Progress Notes (Signed)
Virtual Visit via Video Note  I connected with [facility] on 03/06/21 at  1:00 PM EDT by a video enabled telemedicine application 2/2 [id number] pandemic and verified that I am speaking with the correct person using two identifiers.  Location patient: home Location provider:work or home office Persons participating in the virtual visit: patient, provider  I discussed the limitations of evaluation and management by telemedicine and the availability of in person appointments. The patient expressed understanding and agreed to proceed.   HPI: Pt was around her son who was sick, but COVID negative.  Pt then developed similar symptoms.   Pt with wheezing, productive coughing, nasal congestion, emesis x 3 days. Denies fever, HAs, sore throat Using albuterol inhaler and nebulizer q 4 hrs with continued wheezing. Adamant needs Azithromycin for bronchitis as this typically happens when she gets a cold. Seen virtually by [facility] yesterday.  Given rx for doxy and tessalon for cough due to bacteria per chart review.  Pt has yet to pick up the rxs as she does not have transportation.  Pt a former heavy smoker.  ROS: See pertinent positives and negatives per HPI.  Past Medical History:  Diagnosis Date   Anxiety    Asthma    Bipolar 1 disorder ([place])    Bipolar disorder ([place])    CAD (coronary artery disease)    s/p stent BMS OM Cx   Cervical herniated disc 04/12/2016   COPD (chronic obstructive pulmonary disease) (HCC)    Depression    Diabetes mellitus without complication (HCC)    Diverticulitis    GERD (gastroesophageal reflux disease)    Glaucoma    History of blood transfusion    Hyperlipidemia    Hypertension    OSA (obstructive sleep apnea)    not using cpap    Plantar fasciitis    b/l feet s/p steroid shots w/o help and left surgery w/o help    UTI (urinary tract infection)     Past Surgical History:  Procedure Laterality Date   ABDOMINAL HYSTERECTOMY     ABDOMINAL  SURGERY  1995   Bowel resection.   CARDIAC CATHETERIZATION N/A 04/14/2016   Procedure: Left Heart Cath and Coronary Angiography;  Surgeon: Burnell Blanks, MD;  Location: [place] [facility];  Service: Cardiovascular;  Laterality: N/A;   CARDIAC SURGERY     COLONOSCOPY WITH PROPOFOL N/A 07/11/2019   Procedure: COLONOSCOPY WITH PROPOFOL;  Surgeon: Jonathon Bellows, MD;  Location: [facility] ENDOSCOPY;  Service: Gastroenterology;  Laterality: N/A;   COLONOSCOPY WITH PROPOFOL N/A 07/29/2019   Procedure: COLONOSCOPY WITH PROPOFOL;  Surgeon: Jonathon Bellows, MD;  Location: [facility] ENDOSCOPY;  Service: Gastroenterology;  Laterality: N/A;   CORONARY ANGIOPLASTY WITH STENT PLACEMENT  2010   Drug eluting stent   ESOPHAGOGASTRODUODENOSCOPY (EGD) WITH PROPOFOL N/A 07/11/2019   Procedure: ESOPHAGOGASTRODUODENOSCOPY (EGD) WITH PROPOFOL;  Surgeon: Jonathon Bellows, MD;  Location: [facility] ENDOSCOPY;  Service: Gastroenterology;  Laterality: N/A;   LEFT HEART CATH AND CORONARY ANGIOGRAPHY Left 09/11/2020   Procedure: LEFT HEART CATH AND CORONARY ANGIOGRAPHY;  Surgeon: Nelva Bush, MD;  Location: [place] [facility];  Service: Cardiovascular;  Laterality: Left;   OVARIAN CYST REMOVAL      Family History  Problem Relation Age of Onset   CAD Mother    Depression Mother    Heart disease Mother    Hyperlipidemia Mother    Hypertension Mother    Heart disease Father    Alcohol abuse Father    CAD Brother  Depression Brother    Diabetes Brother    Heart disease Brother    Hyperlipidemia Brother    Lupus Other    Sickle cell anemia Other      Current Outpatient Medications:    albuterol (PROAIR HFA) 108 (90 Base) MCG/ACT inhaler, Inhale 1-2 puffs into the lungs every 4 (four) hours as needed for wheezing or shortness of breath., Disp: 54 g, Rfl: 3   amLODipine (NORVASC) 10 MG tablet, Take 1 tablet (10 mg total) by mouth at bedtime., Disp: 90 tablet, Rfl: 3   aspirin 81 MG chewable tablet, Chew 81 mg by mouth daily.,  Disp: , Rfl:    atorvastatin (LIPITOR) 80 MG tablet, Take 1 tablet (80 mg total) by mouth daily at 6 PM., Disp: 90 tablet, Rfl: 3   benzonatate (TESSALON) 100 MG capsule, Take 1 capsule (100 mg total) by mouth every 8 (eight) hours for 5 days., Disp: 15 capsule, Rfl: 0   Budeson-Glycopyrrol-Formoterol (BREZTRI AEROSPHERE) 160-9-4.8 MCG/ACT AERO, Inhale 2 puffs into the lungs in the morning and at bedtime. Rinse, Disp: 32.1 g, Rfl: 3   clopidogrel (PLAVIX) 75 MG tablet, Take 1 tablet (75 mg total) by mouth daily., Disp: 90 tablet, Rfl: 3   divalproex (DEPAKOTE) 500 MG DR tablet, TAKE 1 TABLET BY MOUTH  TWICE DAILY, Disp: 180 tablet, Rfl: 3   doxycycline (VIBRAMYCIN) 100 MG capsule, Take 1 capsule (100 mg total) by mouth 2 (two) times daily for 7 days., Disp: 14 capsule, Rfl: 0   ezetimibe (ZETIA) 10 MG tablet, TAKE 1 TABLET BY MOUTH  DAILY WITH LIPITOR 80 MG AT NIGHT, Disp: 90 tablet, Rfl: 3   icosapent Ethyl (VASCEPA) 1 g capsule, Take 2 capsules (2 g total) by mouth 2 (two) times daily., Disp: 360 capsule, Rfl: 3   isosorbide mononitrate (IMDUR) 60 MG 24 hr tablet, Take 1 tablet (60 mg total) by mouth in the morning and at bedtime., Disp: 180 tablet, Rfl: 3   methocarbamol (ROBAXIN) 500 MG tablet, Take 1 tablet (500 mg total) by mouth 2 (two) times daily as needed for muscle spasms., Disp: 180 tablet, Rfl: 1   metoCLOPramide (REGLAN) 10 MG tablet, Take 1 tablet (10 mg total) by mouth every 8 (eight) hours as needed for nausea., Disp: 15 tablet, Rfl: 0   nitroGLYCERIN (NITROSTAT) 0.4 MG SL tablet, DISSOLVE 1 TABLET UNDER  TONGUE EVERY 5 MINUTES AS  NEEDED FOR CHEST PAIN MAX  OF 3 TABLETS IN 15 MINUTES  . CALL 911 AFTER THIRD DOSE, Disp: 75 tablet, Rfl: 4   olmesartan-hydrochlorothiazide (BENICAR HCT) 20-12.5 MG tablet, Take 0.5 tablets by mouth daily. If  BP >130/>80 may increase to 1 pill qam. Stop 40-25 mg dose, Disp: 90 tablet, Rfl: 3   pantoprazole (PROTONIX) 40 MG tablet, TAKE 1 TABLET BY MOUTH   TWICE DAILY BEFORE MEALS, Disp: 180 tablet, Rfl: 3   traMADol (ULTRAM) 50 MG tablet, Take 1 tablet (50 mg total) by mouth every 6 (six) hours as needed for moderate pain., Disp: 20 tablet, Rfl: 0   traZODone (DESYREL) 150 MG tablet, Take 1 tablet (150 mg total) by mouth at bedtime as needed for sleep., Disp: 90 tablet, Rfl: 3   Vitamin D, Cholecalciferol, 25 MCG (1000 UT) CAPS, Take 1 capsule by mouth daily., Disp: , Rfl:  No current facility-administered medications for this visit.  Facility-Administered Medications Ordered in Other Visits:    albuterol (PROVENTIL) (2.5 MG/3ML) 0.083% nebulizer solution 2.5 mg, 2.5 mg, Nebulization, Once, Vernard Gambles  L, MD  EXAM:  VITALS per patient if applicable: RR between 123456 bpm  GENERAL: alert, oriented, appears sick and in no acute distress  HEENT: atraumatic, conjunctiva clear, no obvious abnormalities on inspection of external nose and ears  NECK: normal movements of the head and neck  LUNGS: Moderate wheezing after coughing.  on inspection no signs of respiratory distress, breathing rate appears normal, no obvious gross SOB, gasping  CV: no obvious cyanosis  MS: moves all visible extremities without noticeable abnormality  PSYCH/NEURO: pleasant and cooperative, no obvious depression or anxiety, speech and thought processing grossly intact  ASSESSMENT AND PLAN:  Discussed the following assessment and plan:  COPD with acute exacerbation (HCC)  -Given heavy smoking hx and current wheezing start prednisone burst. -continue inhalers and neb txt. -given precautions -Advised to pick up prescriptions from yesterday's ED visit including doxycycline and Tessalon. -For continued symptoms obtain CXR.  For worsening symptoms proceed to nearest ED - Plan: predniSONE (DELTASONE) 20 MG tablet  Viral URI -Continue supportive care -Patient advised to pick up Rx (Tessalon, doxycycline) from yesterday's ED visit with [facility] -Given precautions    Follow-up with PCP/PCP office as needed  I discussed the assessment and treatment plan with the patient. The patient was provided an opportunity to ask questions and all were answered. The patient agreed with the plan and demonstrated an understanding of the instructions.   The patient was advised to call back or seek an in-person evaluation if the symptoms worsen or if the condition fails to improve as anticipated.   Billie Ruddy, MD

## 2021-03-06 NOTE — Addendum Note (Signed)
Addended by: Burnard Hawthorne on: 03/06/2021 01:00 PM   Modules accepted: Orders

## 2021-03-06 NOTE — Telephone Encounter (Signed)
Shawna Anderson  Shawna Anderson New pain management referral in place; please place to pain clinic Shawna Anderson , I didn't list name  Shawna Anderson Let pt know I am trying new location as first one, Dr Humphrey Rolls,  hasnt returned call

## 2021-03-06 NOTE — Telephone Encounter (Signed)
Resending to you both as not sure if came to your box

## 2021-03-08 ENCOUNTER — Telehealth: Payer: Self-pay

## 2021-03-08 NOTE — Telephone Encounter (Signed)
Confirmed fax for [facility]'s Prior authorization form to [employer]. Sent to scan

## 2021-03-10 IMAGING — US US PELVIS COMPLETE
1 series · 14 of 25 positions shown · non-contrast
Comparison: CT 09/21/2019

CLINICAL DATA: Pelvic pain for 2 weeks

EXAM:
TRANSABDOMINAL AND TRANSVAGINAL ULTRASOUND OF PELVIS
TECHNIQUE: Both transabdominal and transvaginal ultrasound examinations of the
pelvis were performed. Transabdominal technique was performed for
global imaging of the pelvis including uterus, ovaries, adnexal
regions, and pelvic cul-de-sac. It was necessary to proceed with
endovaginal exam following the transabdominal exam to visualize the
adnexa and ovaries.

[Series 1: us pelvis (transabdominal only) · 14 of 61 slices shown]
[im 1/61]
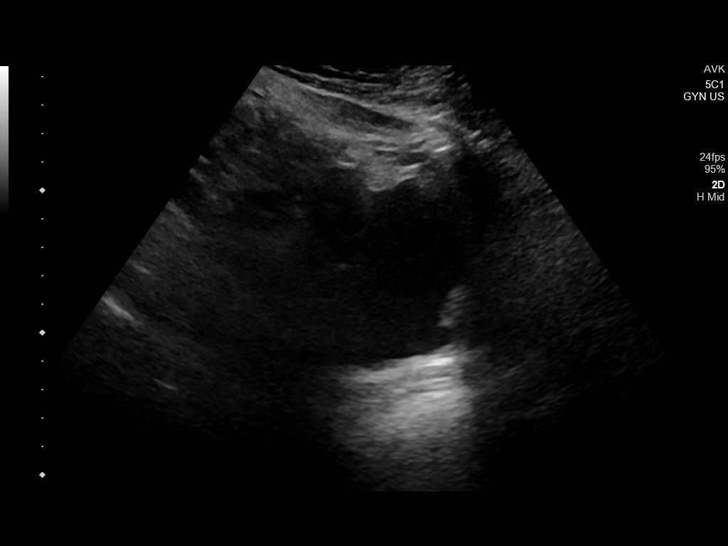
[im 6/61]
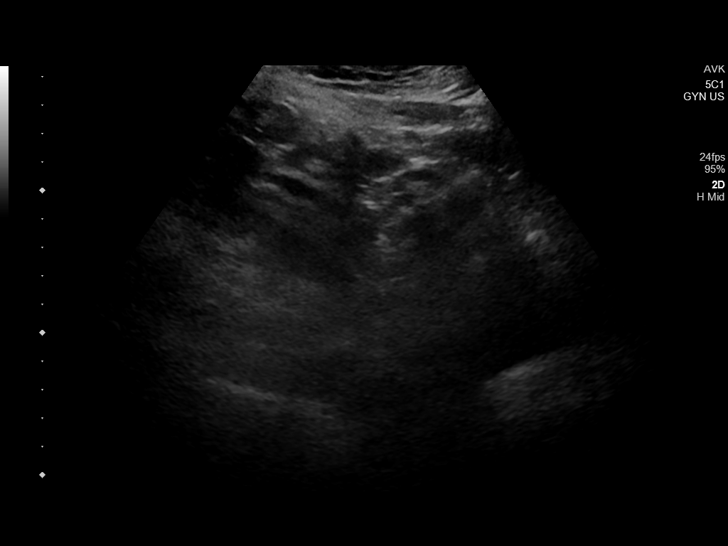
[im 11/61]
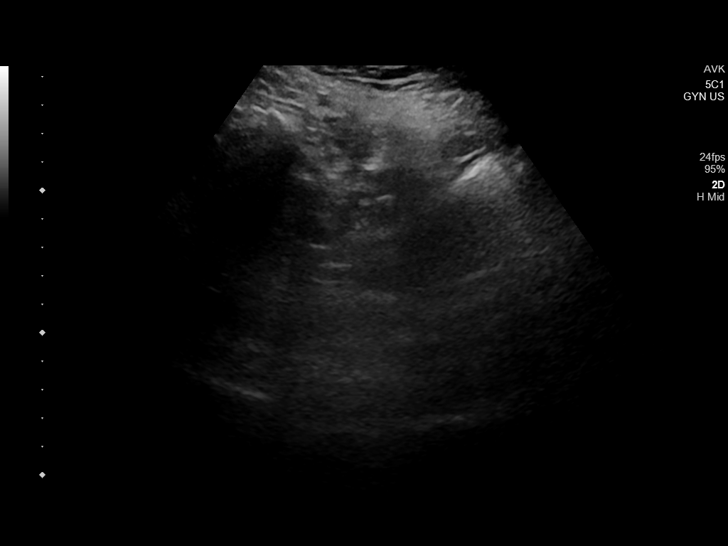
[im 16/61]
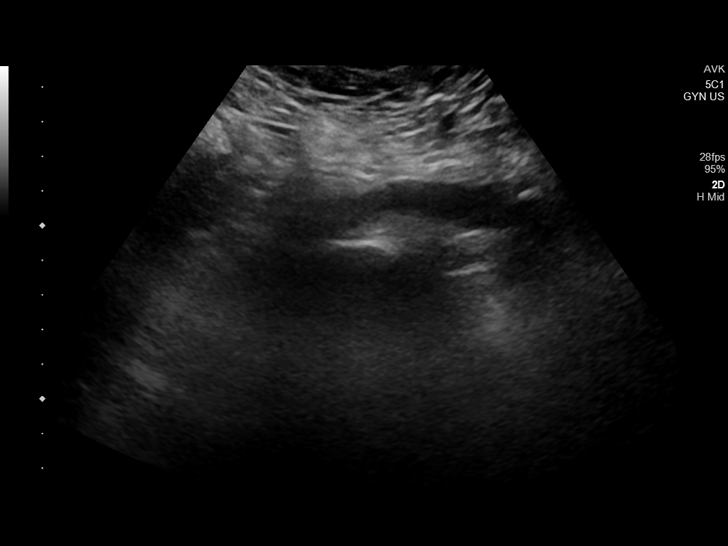
[im 21/61]
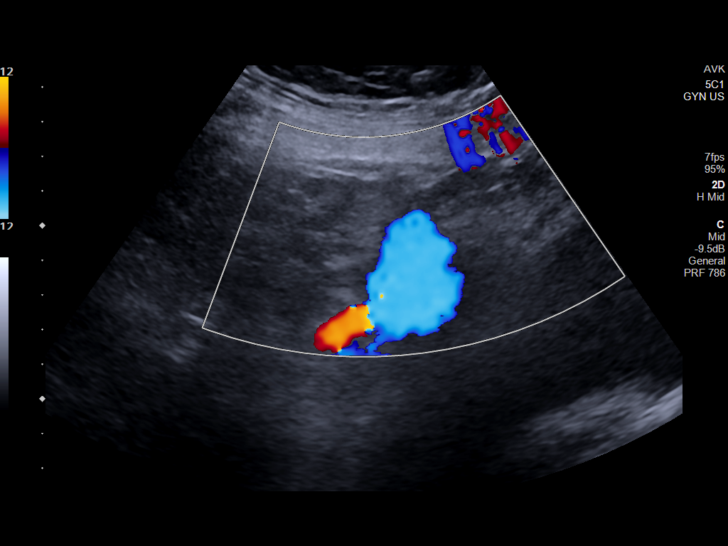
[im 23/61]
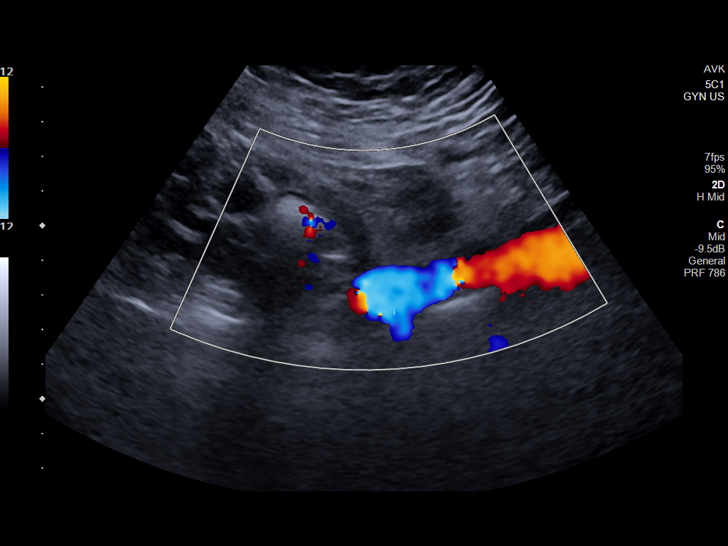
[im 28/61]
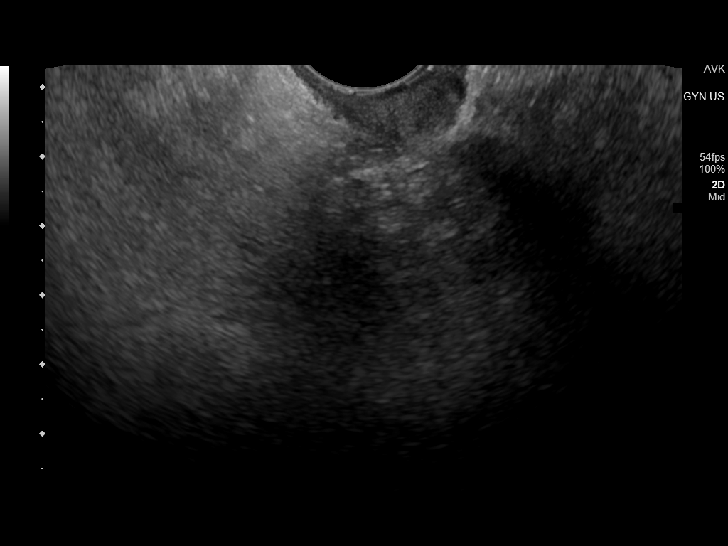
[im 33/61]
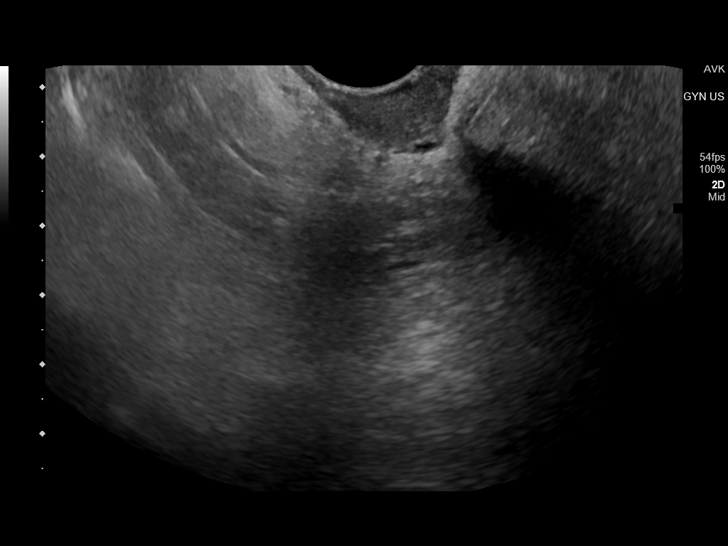
[im 38/61]
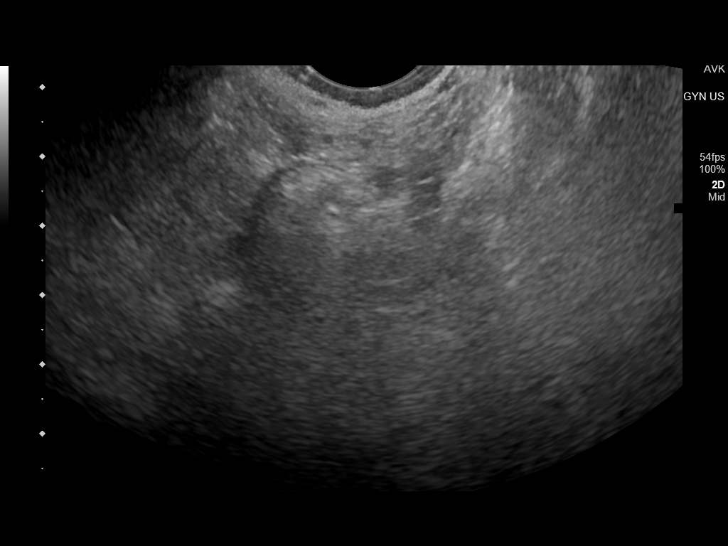
[im 41/61]
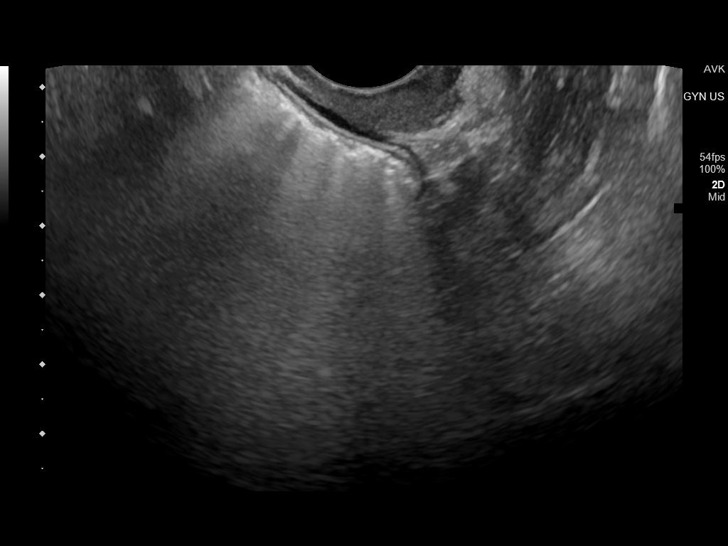
[im 46/61]
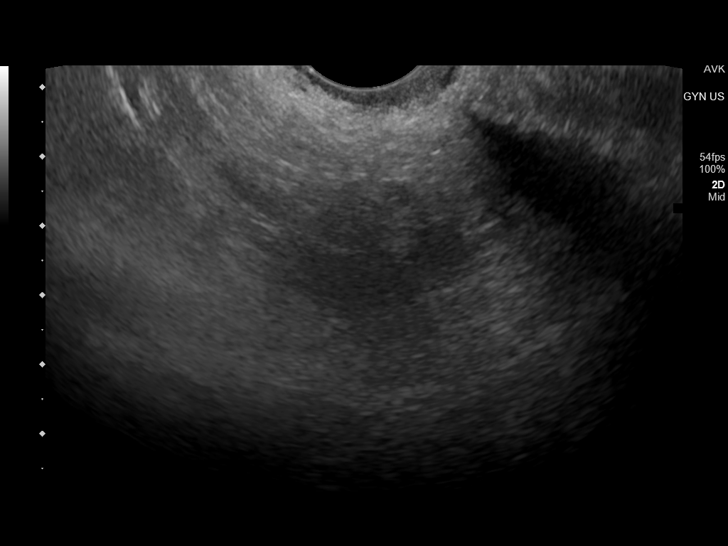
[im 51/61]
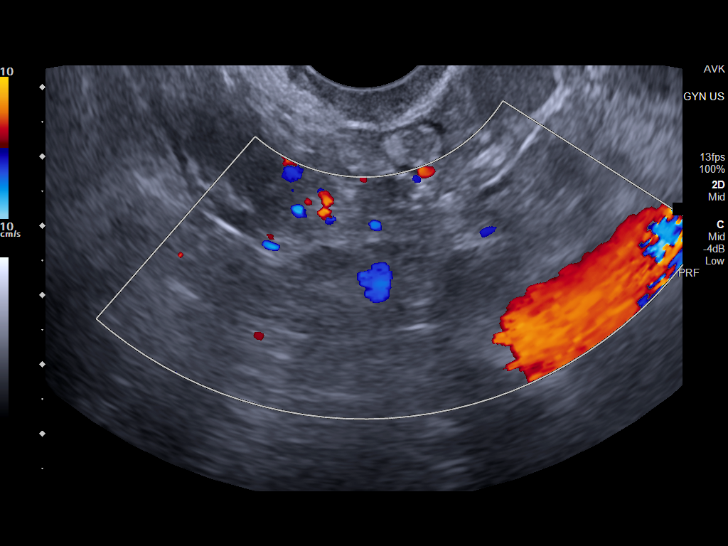
[im 56/61]
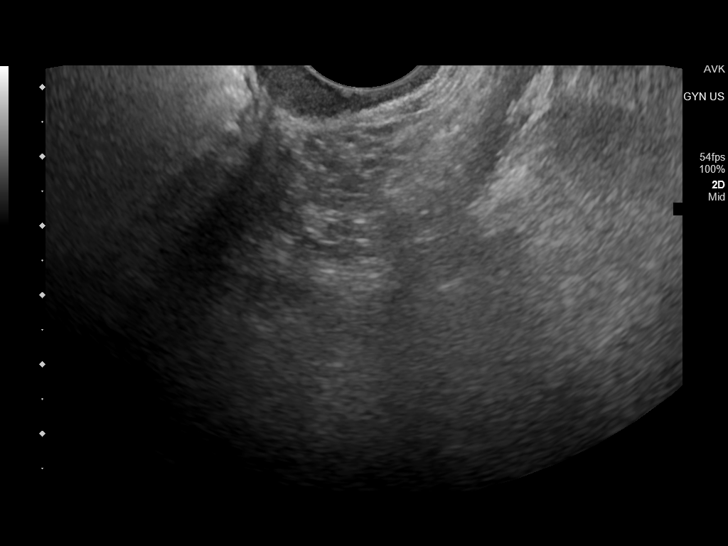
[im 61/61]
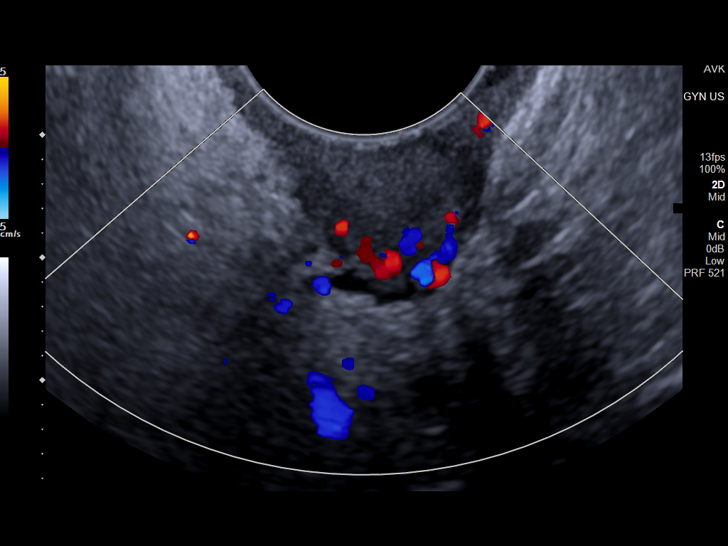

[14 of 25 positions shown; findings below may reference images not displayed]

FINDINGS: Uterus

Surgically absent

Endometrium

Surgically absent

Right ovary

Not seen

Left ovary

Not seen

Other findings

No abnormal free fluid.
IMPRESSION: Status post hysterectomy.  Nonvisualized ovaries.

## 2021-03-11 ENCOUNTER — Other Ambulatory Visit: Payer: Self-pay

## 2021-03-11 ENCOUNTER — Encounter: Payer: Self-pay | Admitting: Gastroenterology

## 2021-03-11 ENCOUNTER — Ambulatory Visit (INDEPENDENT_AMBULATORY_CARE_PROVIDER_SITE_OTHER): Payer: Medicare Other | Admitting: Gastroenterology

## 2021-03-11 VITALS — BP 102/68 | HR 80 | Temp 98.2°F | Ht 63.0 in | Wt 180.4 lb

## 2021-03-11 DIAGNOSIS — K3184 Gastroparesis: Secondary | ICD-10-CM

## 2021-03-11 DIAGNOSIS — K5904 Chronic idiopathic constipation: Secondary | ICD-10-CM | POA: Diagnosis not present

## 2021-03-11 MED ORDER — METOCLOPRAMIDE HCL 5 MG PO TABS
5.0000 mg | ORAL_TABLET | Freq: Three times a day (TID) | ORAL | 2 refills | Status: DC
Start: 2021-03-11 — End: 2021-04-24

## 2021-03-11 NOTE — Patient Instructions (Addendum)
Gave linzess 249mg please let [place] how they work and we can call you in a prescription.  Gastroparesis  Gastroparesis is a condition in which food takes longer than normal to empty from the stomach. This condition is also known as delayed gastric emptying. It is usually a long-term (chronic) condition. There is no cure, but there are treatments and things that you can do at home to help relieve symptoms. Treating the underlying condition that causesgastroparesis can also help relieve symptoms. What are the causes? In many cases, the cause of this condition is not known. Possible causes include: A hormone (endocrine) disorder, such as hypothyroidism or diabetes. A nervous system disease, such as Parkinson's disease or multiple sclerosis. Cancer, infection, or surgery that affects the stomach or vagus nerve. The vagus nerve runs from your chest, through your neck, and to the lower part of your brain. A connective tissue disorder, such as scleroderma. Certain medicines. What increases the risk? You are more likely to develop this condition if: You have certain disorders or diseases. These may include: An endocrine disorder. An eating disorder. Amyloidosis. Scleroderma. Parkinson's disease. Multiple sclerosis. Cancer or infection of the stomach or the vagus nerve. You have had surgery on your stomach or vagus nerve. You take certain medicines. You are female. What are the signs or symptoms? Symptoms of this condition include: Feeling full after eating very little or a loss of appetite. Nausea, vomiting, or heartburn. Bloating of your abdomen. Inconsistent blood sugar (glucose) levels on blood tests. Unexplained weight loss. Acid from the stomach coming up into the esophagus (gastroesophageal reflux). Sudden tightening (spasm) of the stomach, which can be painful. Symptoms may come and go. Some people may not notice any symptoms. How is this diagnosed? This condition is diagnosed  with tests, such as: Tests that check how long it takes food to move through the stomach and intestines. These tests include: Upper gastrointestinal (GI) series. For this test, you drink a liquid that shows up well on X-rays, and then X-rays are taken of your intestines. Gastric emptying scintigraphy. For this test, you eat food that contains a small amount of radioactive material, and then scans are taken. Wireless capsule GI monitoring system. For this test, you swallow a pill (capsule) that records information about how foods and fluid move through your stomach. Gastric manometry. For this test, a tube is passed down your throat and into your stomach to measure electrical and muscular activity. Endoscopy. For this test, a long, thin tube with a camera and light on the end is passed down your throat and into your stomach to check for problems in your stomach lining. Ultrasound. This test uses sound waves to create images of the inside of your body. This can help rule out gallbladder disease or pancreatitis as a cause of your symptoms. How is this treated? There is no cure for this condition, but treatment and home care may relieve symptoms. Treatment may include: Treating the underlying cause. Managing your symptoms by making changes to your diet and exercise habits. Taking medicines to control nausea and vomiting and to stimulate stomach muscles. Getting food through a feeding tube in the hospital. This may be done in severe cases. Having surgery to insert a device called a gastric electrical stimulator into your body. This device helps improve stomach emptying and control nausea and vomiting. Follow these instructions at home: Take over-the-counter and prescription medicines only as told by your health care provider. Follow instructions from your health care provider about  eating or drinking restrictions. Your health care provider may recommend that you: Eat smaller meals more often. Eat  low-fat foods. Eat low-fiber forms of high-fiber foods. For example, eat cooked vegetables instead of raw vegetables. Have only liquid foods instead of solid foods. Liquid foods are easier to digest. Drink enough fluid to keep your urine pale yellow. Exercise as often as told by your health care provider. Keep all follow-up visits. This is important. Contact a health care provider if you: Notice that your symptoms do not improve with treatment. Have new symptoms. Get help right away if you: Have severe pain in your abdomen that does not improve with treatment. Have nausea that is severe or does not go away. Vomit every time you drink fluids. Summary Gastroparesis is a long-term (chronic) condition in which food takes longer than normal to empty from the stomach. Symptoms include nausea, vomiting, heartburn, bloating of your abdomen, and loss of appetite. Eating smaller portions, low-fat foods, and low-fiber forms of high-fiber foods may help you manage your symptoms. Get help right away if you have severe pain in your abdomen. This information is not intended to replace advice given to you by your health care provider. Make sure you discuss any questions you have with your healthcare provider. Document Revised: 11/07/2019 Document Reviewed: 11/07/2019 Elsevier Patient Education  2022 [employer].

## 2021-03-11 NOTE — Progress Notes (Signed)
Cephas Darby, MD [address]  [place]  [place], [place] [address]  Main: [phone]  Fax: [phone]    Gastroenterology Consultation  Referring Provider:     McLean-Scocuzza, Olivia Mackie * Primary Care Physician:  McLean-Scocuzza, Nino Glow, MD Primary Gastroenterologist:  Dr. Jonathon Bellows Reason for Consultation:     Nausea, vomiting, chronic constipation        HPI:   Shawna Anderson is a 55 y.o. female referred by Dr. Terese Door, Nino Glow, MD  for consultation & management of chronic symptoms of nausea, vomiting, left upper quadrant pain, chronic constipation.  Patient had several hospitalizations and ER visits for nausea and vomiting.  She is diagnosed with mild gastroparesis based on gastric emptying study in 07/2020.  She was on Reglan as needed.  Patient was previously under the care of Dr. Vicente Males and she wanted to switch her care to me.  Patient has history of chronic marijuana use since age of 36.  She also reports severe constipation, has tried MiraLAX and other laxatives, stool softeners with no relief.  Her labs are unrevealing, TSH normal  NSAIDs: None  Antiplts/Anticoagulants/Anti thrombotics: None  GI Procedures:  Colonoscopy 07/29/2019 - One 5 mm polyp in the distal ascending colon, removed with a cold snare. Resected and retrieved. Clip was placed. - The examination was otherwise normal.  Upper endoscopy 07/11/2019 - Non-obstructing Schatzki ring. - Gastritis. Biopsied. - Normal examined duodenum.   Past Medical History:  Diagnosis Date   Anxiety    Asthma    Bipolar 1 disorder ([facility])    Bipolar disorder ([place])    CAD (coronary artery disease)    s/p stent BMS OM Cx   Cervical herniated disc 04/12/2016   COPD (chronic obstructive pulmonary disease) ([facility])    Depression    Diabetes mellitus without complication ([facility])    Diverticulitis    GERD (gastroesophageal reflux disease)    Glaucoma    History of blood transfusion    Hyperlipidemia     Hypertension    OSA (obstructive sleep apnea)    not using cpap    Plantar fasciitis    b/l feet s/p steroid shots w/o help and left surgery w/o help    UTI (urinary tract infection)     Past Surgical History:  Procedure Laterality Date   ABDOMINAL HYSTERECTOMY     ABDOMINAL SURGERY  1995   Bowel resection.   CARDIAC CATHETERIZATION N/A 04/14/2016   Procedure: Left Heart Cath and Coronary Angiography;  Surgeon: Burnell Blanks, MD;  Location: [facility];  Service: Cardiovascular;  Laterality: N/A;   CARDIAC SURGERY     COLONOSCOPY WITH PROPOFOL N/A 07/11/2019   Procedure: COLONOSCOPY WITH PROPOFOL;  Surgeon: Jonathon Bellows, MD;  Location: [facility];  Service: Gastroenterology;  Laterality: N/A;   COLONOSCOPY WITH PROPOFOL N/A 07/29/2019   Procedure: COLONOSCOPY WITH PROPOFOL;  Surgeon: Jonathon Bellows, MD;  Location: [facility]COPY;  Service: Gastroenterology;  Laterality: N/A;   CORONARY ANGIOPLASTY WITH STENT PLACEMENT  2010   Drug eluting stent   ESOPHAGOGASTRODUODENOSCOPY (EGD) WITH PROPOFOL N/A 07/11/2019   Procedure: ESOPHAGOGASTRODUODENOSCOPY (EGD) WITH PROPOFOL;  Surgeon: Jonathon Bellows, MD;  Location: [facility]COPY;  Service: Gastroenterology;  Laterality: N/A;   LEFT HEART CATH AND CORONARY ANGIOGRAPHY Left 09/11/2020   Procedure: LEFT HEART CATH AND CORONARY ANGIOGRAPHY;  Surgeon: Nelva Bush, MD;  Location: [facility];  Service: Cardiovascular;  Laterality: Left;   OVARIAN CYST REMOVAL     Current  Outpatient Medications:    albuterol (PROAIR HFA) 108 (90 Base) MCG/ACT inhaler, Inhale 1-2 puffs into the lungs every 4 (four) hours as needed for wheezing or shortness of breath., Disp: 54 g, Rfl: 3   amLODipine (NORVASC) 10 MG tablet, Take 1 tablet (10 mg total) by mouth at bedtime., Disp: 90 tablet, Rfl: 3   aspirin 81 MG chewable tablet, Chew 81 mg by mouth daily., Disp: , Rfl:    atorvastatin (LIPITOR) 80 MG tablet, Take 1 tablet (80 mg total) by mouth  daily at 6 PM., Disp: 90 tablet, Rfl: 3   Budeson-Glycopyrrol-Formoterol (BREZTRI AEROSPHERE) 160-9-4.8 MCG/ACT AERO, Inhale 2 puffs into the lungs in the morning and at bedtime. Rinse, Disp: 32.1 g, Rfl: 3   clopidogrel (PLAVIX) 75 MG tablet, Take 1 tablet (75 mg total) by mouth daily., Disp: 90 tablet, Rfl: 3   divalproex (DEPAKOTE) 500 MG DR tablet, TAKE 1 TABLET BY MOUTH  TWICE DAILY, Disp: 180 tablet, Rfl: 3   doxycycline (VIBRAMYCIN) 100 MG capsule, Take 1 capsule (100 mg total) by mouth 2 (two) times daily for 7 days., Disp: 14 capsule, Rfl: 0   ezetimibe (ZETIA) 10 MG tablet, TAKE 1 TABLET BY MOUTH  DAILY WITH LIPITOR 80 MG AT NIGHT, Disp: 90 tablet, Rfl: 3   icosapent Ethyl (VASCEPA) 1 g capsule, Take 2 capsules (2 g total) by mouth 2 (two) times daily., Disp: 360 capsule, Rfl: 3   methocarbamol (ROBAXIN) 500 MG tablet, Take 1 tablet (500 mg total) by mouth 2 (two) times daily as needed for muscle spasms., Disp: 180 tablet, Rfl: 1   metoCLOPramide (REGLAN) 5 MG tablet, Take 1 tablet (5 mg total) by mouth 4 (four) times daily -  before meals and at bedtime., Disp: 120 tablet, Rfl: 2   nitroGLYCERIN (NITROSTAT) 0.4 MG SL tablet, DISSOLVE 1 TABLET UNDER  TONGUE EVERY 5 MINUTES AS  NEEDED FOR CHEST PAIN MAX  OF 3 TABLETS IN 15 MINUTES  . CALL 911 AFTER THIRD DOSE, Disp: 75 tablet, Rfl: 4   olmesartan-hydrochlorothiazide (BENICAR HCT) 20-12.5 MG tablet, Take 0.5 tablets by mouth daily. If  BP >130/>80 may increase to 1 pill qam. Stop 40-25 mg dose, Disp: 90 tablet, Rfl: 3   pantoprazole (PROTONIX) 40 MG tablet, TAKE 1 TABLET BY MOUTH  TWICE DAILY BEFORE MEALS, Disp: 180 tablet, Rfl: 3   REPATHA SURECLICK XX123456 MG/ML SOAJ, Inject into the skin., Disp: , Rfl:    traMADol (ULTRAM) 50 MG tablet, Take 1 tablet (50 mg total) by mouth every 6 (six) hours as needed for moderate pain., Disp: 20 tablet, Rfl: 0   traZODone (DESYREL) 150 MG tablet, Take 1 tablet (150 mg total) by mouth at bedtime as needed for  sleep., Disp: 90 tablet, Rfl: 3   Vitamin D, Cholecalciferol, 25 MCG (1000 UT) CAPS, Take 1 capsule by mouth daily., Disp: , Rfl:    isosorbide mononitrate (IMDUR) 60 MG 24 hr tablet, Take 1 tablet (60 mg total) by mouth in the morning and at bedtime. (Patient not taking: Reported on 03/11/2021), Disp: 180 tablet, Rfl: 3   OZEMPIC, 0.25 OR 0.5 MG/DOSE, 2 MG/1.5ML SOPN, Inject into the skin. (Patient not taking: Reported on 03/11/2021), Disp: , Rfl:  No current facility-administered medications for this visit.  Facility-Administered Medications Ordered in Other Visits:    albuterol (PROVENTIL) (2.5 MG/3ML) 0.083% nebulizer solution 2.5 mg, 2.5 mg, Nebulization, Once, Tyler Pita, MD    Family History  Problem Relation Age of Onset  CAD Mother    Depression Mother    Heart disease Mother    Hyperlipidemia Mother    Hypertension Mother    Heart disease Father    Alcohol abuse Father    CAD Brother    Depression Brother    Diabetes Brother    Heart disease Brother    Hyperlipidemia Brother    Lupus Other    Sickle cell anemia Other      Social History   Tobacco Use   Smoking status: Former    Packs/day: 1.00    Years: 30.00    Pack years: 30.00    Types: Cigarettes    Quit date: 12/31/2020    Years since quitting: 0.1   Smokeless tobacco: Never   Tobacco comments:    Quit after being admitted on 6/20 01/17/2021  Vaping Use   Vaping Use: Never used  Substance Use Topics   Alcohol use: Yes    Comment: once a year   Drug use: Yes    Frequency: 7.0 times per week    Types: Marijuana    Comment: 1 per day    Allergies as of 03/11/2021 - Review Complete 03/11/2021  Allergen Reaction Noted   Ativan [lorazepam]  11/14/2017   Augmentin [amoxicillin-pot clavulanate]  12/05/2020   Latex Other (See Comments) 02/12/2017   Tape Other (See Comments) 02/12/2017   Xifaxan [rifaximin]  09/21/2019   Drixoral allergy sinus [dexbromphen-pse-apap er] Rash 01/12/2015    Review  of Systems:    All systems reviewed and negative except where noted in HPI.   Physical Exam:  BP 102/68 (BP Location: Left Arm, Patient Position: Sitting, Cuff Size: Normal)   Pulse 80   Temp 98.2 F (36.8 C) (Oral)   Ht '5\' 3"'$  (1.6 m)   Wt 180 lb 6 oz (81.8 kg)   BMI 31.95 kg/m  No LMP recorded. Patient has had a hysterectomy.  General:   Alert,  Well-developed, well-nourished, pleasant and cooperative in NAD Head:  Normocephalic and atraumatic. Eyes:  Sclera clear, no icterus.   Conjunctiva pink. Ears:  Normal auditory acuity. Nose:  No deformity, discharge, or lesions. Mouth:  No deformity or lesions,oropharynx pink & moist. Neck:  Supple; no masses or thyromegaly. Lungs:  Respirations even and unlabored.  Clear throughout to auscultation.   No wheezes, crackles, or rhonchi. No acute distress. Heart:  Regular rate and rhythm; no murmurs, clicks, rubs, or gallops. Abdomen:  Normal bowel sounds. Soft, non-tender and non-distended without masses, hepatosplenomegaly or hernias noted.  No guarding or rebound tenderness.   Rectal: Not performed Msk:  Symmetrical without gross deformities. Good, equal movement & strength bilaterally. Pulses:  Normal pulses noted. Extremities:  No clubbing or edema.  No cyanosis. Neurologic:  Alert and oriented x3;  grossly normal neurologically. Skin:  Intact without significant lesions or rashes. No jaundice. Psych:  Alert and cooperative. Normal mood and affect.  Imaging Studies: Reviewed  Assessment and Plan:   KRISTEE HOLNESS is a 55 y.o. female with history of chronic marijuana use, bipolar, diabetes, COPD, coronary artery disease s/p PCI on DAPT is seen in consultation for gastroparesis and chronic constipation  Gastroparesis Restart Reglan 5 mg before each meal and at bedtime Discussed about gastroparesis diet, information provided Control of diabetes  Chronic constipation Trial of Linzess 290 MCG, samples provided  Chronic  marijuana use Likely precipitating gastroparesis Can try amitriptyline 25 mg at bedtime   Follow up in 3 months   Cephas Darby, MD

## 2021-03-14 ENCOUNTER — Telehealth: Payer: Medicare Other | Admitting: Family Medicine

## 2021-03-14 DIAGNOSIS — B999 Unspecified infectious disease: Secondary | ICD-10-CM

## 2021-03-14 NOTE — Progress Notes (Signed)
Needs in person care  Shawna Anderson

## 2021-03-20 ENCOUNTER — Ambulatory Visit: Payer: Medicare Other | Admitting: Gastroenterology

## 2021-03-26 ENCOUNTER — Ambulatory Visit: Payer: Medicare Other | Admitting: Podiatry

## 2021-04-09 ENCOUNTER — Ambulatory Visit: Payer: Medicare Other | Admitting: Gastroenterology

## 2021-04-11 ENCOUNTER — Ambulatory Visit: Payer: Medicare Other | Admitting: Medical

## 2021-04-16 ENCOUNTER — Ambulatory Visit: Payer: Medicare Other | Admitting: Medical

## 2021-04-19 ENCOUNTER — Other Ambulatory Visit: Payer: Self-pay | Admitting: Internal Medicine

## 2021-04-19 DIAGNOSIS — G4700 Insomnia, unspecified: Secondary | ICD-10-CM

## 2021-04-19 DIAGNOSIS — I10 Essential (primary) hypertension: Secondary | ICD-10-CM

## 2021-04-22 ENCOUNTER — Emergency Department: Payer: Medicare Other

## 2021-04-22 ENCOUNTER — Ambulatory Visit: Payer: Medicare Other | Admitting: Medical

## 2021-04-22 ENCOUNTER — Inpatient Hospital Stay
Admission: EM | Admit: 2021-04-22 | Discharge: 2021-04-24 | DRG: 392 | Disposition: A | Discharge status: Home / Self Care | Payer: Medicare Other | Attending: Hospitalist | Admitting: Hospitalist

## 2021-04-22 ENCOUNTER — Other Ambulatory Visit: Payer: Self-pay | Admitting: Internal Medicine

## 2021-04-22 ENCOUNTER — Other Ambulatory Visit: Payer: Self-pay

## 2021-04-22 DIAGNOSIS — E1143 Type 2 diabetes mellitus with diabetic autonomic (poly)neuropathy: Secondary | ICD-10-CM | POA: Diagnosis not present

## 2021-04-22 DIAGNOSIS — R7303 Prediabetes: Secondary | ICD-10-CM | POA: Diagnosis not present

## 2021-04-22 DIAGNOSIS — D72829 Elevated white blood cell count, unspecified: Secondary | ICD-10-CM | POA: Diagnosis not present

## 2021-04-22 DIAGNOSIS — F32A Depression, unspecified: Secondary | ICD-10-CM | POA: Diagnosis not present

## 2021-04-22 DIAGNOSIS — Z83438 Family history of other disorder of lipoprotein metabolism and other lipidemia: Secondary | ICD-10-CM

## 2021-04-22 DIAGNOSIS — J449 Chronic obstructive pulmonary disease, unspecified: Secondary | ICD-10-CM | POA: Diagnosis not present

## 2021-04-22 DIAGNOSIS — Z20822 Contact with and (suspected) exposure to covid-19: Secondary | ICD-10-CM | POA: Diagnosis not present

## 2021-04-22 DIAGNOSIS — F1990 Other psychoactive substance use, unspecified, uncomplicated: Secondary | ICD-10-CM | POA: Diagnosis not present

## 2021-04-22 DIAGNOSIS — K5730 Diverticulosis of large intestine without perforation or abscess without bleeding: Secondary | ICD-10-CM | POA: Diagnosis not present

## 2021-04-22 DIAGNOSIS — R112 Nausea with vomiting, unspecified: Secondary | ICD-10-CM | POA: Diagnosis not present

## 2021-04-22 DIAGNOSIS — F319 Bipolar disorder, unspecified: Secondary | ICD-10-CM | POA: Diagnosis not present

## 2021-04-22 DIAGNOSIS — Z955 Presence of coronary angioplasty implant and graft: Secondary | ICD-10-CM

## 2021-04-22 DIAGNOSIS — Z818 Family history of other mental and behavioral disorders: Secondary | ICD-10-CM

## 2021-04-22 DIAGNOSIS — E860 Dehydration: Secondary | ICD-10-CM | POA: Diagnosis not present

## 2021-04-22 DIAGNOSIS — Z888 Allergy status to other drugs, medicaments and biological substances status: Secondary | ICD-10-CM

## 2021-04-22 DIAGNOSIS — Z91048 Other nonmedicinal substance allergy status: Secondary | ICD-10-CM

## 2021-04-22 DIAGNOSIS — R109 Unspecified abdominal pain: Secondary | ICD-10-CM | POA: Diagnosis not present

## 2021-04-22 DIAGNOSIS — K3184 Gastroparesis: Secondary | ICD-10-CM | POA: Diagnosis present

## 2021-04-22 DIAGNOSIS — F419 Anxiety disorder, unspecified: Secondary | ICD-10-CM | POA: Diagnosis not present

## 2021-04-22 DIAGNOSIS — Z833 Family history of diabetes mellitus: Secondary | ICD-10-CM | POA: Diagnosis not present

## 2021-04-22 DIAGNOSIS — K2100 Gastro-esophageal reflux disease with esophagitis, without bleeding: Secondary | ICD-10-CM | POA: Diagnosis present

## 2021-04-22 DIAGNOSIS — E876 Hypokalemia: Secondary | ICD-10-CM | POA: Diagnosis present

## 2021-04-22 DIAGNOSIS — G4733 Obstructive sleep apnea (adult) (pediatric): Secondary | ICD-10-CM | POA: Diagnosis present

## 2021-04-22 DIAGNOSIS — F1210 Cannabis abuse, uncomplicated: Secondary | ICD-10-CM | POA: Diagnosis present

## 2021-04-22 DIAGNOSIS — F1910 Other psychoactive substance abuse, uncomplicated: Secondary | ICD-10-CM | POA: Diagnosis not present

## 2021-04-22 DIAGNOSIS — I2510 Atherosclerotic heart disease of native coronary artery without angina pectoris: Secondary | ICD-10-CM | POA: Diagnosis not present

## 2021-04-22 DIAGNOSIS — K5900 Constipation, unspecified: Secondary | ICD-10-CM | POA: Diagnosis not present

## 2021-04-22 DIAGNOSIS — I10 Essential (primary) hypertension: Secondary | ICD-10-CM | POA: Diagnosis present

## 2021-04-22 DIAGNOSIS — Z91040 Latex allergy status: Secondary | ICD-10-CM

## 2021-04-22 DIAGNOSIS — Z9861 Coronary angioplasty status: Secondary | ICD-10-CM

## 2021-04-22 DIAGNOSIS — Z8249 Family history of ischemic heart disease and other diseases of the circulatory system: Secondary | ICD-10-CM

## 2021-04-22 DIAGNOSIS — R1013 Epigastric pain: Secondary | ICD-10-CM | POA: Diagnosis not present

## 2021-04-22 DIAGNOSIS — R079 Chest pain, unspecified: Secondary | ICD-10-CM | POA: Diagnosis not present

## 2021-04-22 DIAGNOSIS — Z811 Family history of alcohol abuse and dependence: Secondary | ICD-10-CM

## 2021-04-22 DIAGNOSIS — R059 Cough, unspecified: Secondary | ICD-10-CM | POA: Diagnosis not present

## 2021-04-22 DIAGNOSIS — K219 Gastro-esophageal reflux disease without esophagitis: Secondary | ICD-10-CM | POA: Diagnosis present

## 2021-04-22 DIAGNOSIS — Z7902 Long term (current) use of antithrombotics/antiplatelets: Secondary | ICD-10-CM

## 2021-04-22 DIAGNOSIS — E785 Hyperlipidemia, unspecified: Secondary | ICD-10-CM | POA: Diagnosis present

## 2021-04-22 DIAGNOSIS — Z7982 Long term (current) use of aspirin: Secondary | ICD-10-CM

## 2021-04-22 DIAGNOSIS — R9431 Abnormal electrocardiogram [ECG] [EKG]: Secondary | ICD-10-CM | POA: Diagnosis not present

## 2021-04-22 DIAGNOSIS — Z832 Family history of diseases of the blood and blood-forming organs and certain disorders involving the immune mechanism: Secondary | ICD-10-CM | POA: Diagnosis not present

## 2021-04-22 DIAGNOSIS — R1110 Vomiting, unspecified: Secondary | ICD-10-CM | POA: Diagnosis not present

## 2021-04-22 DIAGNOSIS — Z87891 Personal history of nicotine dependence: Secondary | ICD-10-CM

## 2021-04-22 DIAGNOSIS — F1410 Cocaine abuse, uncomplicated: Secondary | ICD-10-CM | POA: Diagnosis present

## 2021-04-22 DIAGNOSIS — Z79899 Other long term (current) drug therapy: Secondary | ICD-10-CM

## 2021-04-22 LAB — LIPASE, BLOOD: Lipase: 43 U/L (ref 11–51)

## 2021-04-22 LAB — MAGNESIUM: Magnesium: 2.7 mg/dL — ABNORMAL HIGH (ref 1.7–2.4)

## 2021-04-22 LAB — CBC
HCT: 40.7 % (ref 36.0–46.0)
Hemoglobin: 14.5 g/dL (ref 12.0–15.0)
MCH: 30.8 pg (ref 26.0–34.0)
MCHC: 35.6 g/dL (ref 30.0–36.0)
MCV: 86.4 fL (ref 80.0–100.0)
Platelets: 485 10*3/uL — ABNORMAL HIGH (ref 150–400)
RBC: 4.71 MIL/uL (ref 3.87–5.11)
RDW: 13 % (ref 11.5–15.5)
WBC: 12.7 10*3/uL — ABNORMAL HIGH (ref 4.0–10.5)
nRBC: 0 % (ref 0.0–0.2)

## 2021-04-22 LAB — PREGNANCY, URINE: Preg Test, Ur: NEGATIVE

## 2021-04-22 LAB — URINALYSIS, COMPLETE (UACMP) WITH MICROSCOPIC
Bacteria, UA: NONE SEEN
Bilirubin Urine: NEGATIVE
Glucose, UA: 50 mg/dL — AB
Ketones, ur: NEGATIVE mg/dL
Leukocytes,Ua: NEGATIVE
Nitrite: NEGATIVE
Protein, ur: NEGATIVE mg/dL
Specific Gravity, Urine: 1.009 (ref 1.005–1.030)
pH: 8 (ref 5.0–8.0)

## 2021-04-22 LAB — COMPREHENSIVE METABOLIC PANEL
ALT: 81 U/L — ABNORMAL HIGH (ref 0–44)
AST: 27 U/L (ref 15–41)
Albumin: 4.1 g/dL (ref 3.5–5.0)
Alkaline Phosphatase: 86 U/L (ref 38–126)
Anion gap: 11 (ref 5–15)
BUN: 20 mg/dL (ref 6–20)
CO2: 34 mmol/L — ABNORMAL HIGH (ref 22–32)
Calcium: 9.3 mg/dL (ref 8.9–10.3)
Chloride: 89 mmol/L — ABNORMAL LOW (ref 98–111)
Creatinine, Ser: 0.83 mg/dL (ref 0.44–1.00)
GFR, Estimated: >60 mL/min (ref >60)
Glucose, Bld: 141 mg/dL — ABNORMAL HIGH (ref 70–99)
Potassium: 3 mmol/L — ABNORMAL LOW (ref 3.5–5.1)
Sodium: 134 mmol/L — ABNORMAL LOW (ref 135–145)
Total Bilirubin: 0.7 mg/dL (ref 0.3–1.2)
Total Protein: 7.1 g/dL (ref 6.5–8.1)

## 2021-04-22 LAB — URINE DRUG SCREEN, QUALITATIVE (ARMC ONLY)
Amphetamines, Ur Screen: NOT DETECTED
Barbiturates, Ur Screen: NOT DETECTED
Benzodiazepine, Ur Scrn: NOT DETECTED
Cannabinoid 50 Ng, Ur ~~LOC~~: POSITIVE — AB
Cocaine Metabolite,Ur ~~LOC~~: POSITIVE — AB
MDMA (Ecstasy)Ur Screen: NOT DETECTED
Methadone Scn, Ur: NOT DETECTED
Opiate, Ur Screen: POSITIVE — AB
Phencyclidine (PCP) Ur S: NOT DETECTED
Tricyclic, Ur Screen: NOT DETECTED

## 2021-04-22 LAB — RESP PANEL BY RT-PCR (FLU A&B, COVID) ARPGX2
Influenza A by PCR: NEGATIVE
Influenza B by PCR: NEGATIVE
SARS Coronavirus 2 by RT PCR: NEGATIVE

## 2021-04-22 LAB — GLUCOSE, CAPILLARY
Glucose-Capillary: 87 mg/dL (ref 70–99)
Glucose-Capillary: 189 mg/dL — ABNORMAL HIGH (ref 70–99)

## 2021-04-22 MED ORDER — OLMESARTAN MEDOXOMIL-HCTZ 20-12.5 MG PO TABS: 0.5000 | ORAL_TABLET | Freq: Every day | ORAL | Status: DC

## 2021-04-22 MED ORDER — DIVALPROEX SODIUM 500 MG PO DR TAB
500.0000 mg | DELAYED_RELEASE_TABLET | Freq: Two times a day (BID) | ORAL | Status: DC
  Administered 2021-04-22 – 2021-04-24 (×4): 500 mg via ORAL
  Filled 2021-04-22 (×5): qty 1

## 2021-04-22 MED ORDER — DM-GUAIFENESIN ER 30-600 MG PO TB12: 1.0000 | ORAL_TABLET | Freq: Two times a day (BID) | ORAL | Status: DC | PRN

## 2021-04-22 MED ORDER — SENNOSIDES-DOCUSATE SODIUM 8.6-50 MG PO TABS
1.0000 | ORAL_TABLET | Freq: Two times a day (BID) | ORAL | Status: DC
  Administered 2021-04-22 – 2021-04-24 (×4): 1 via ORAL
  Filled 2021-04-22 (×5): qty 1

## 2021-04-22 MED ORDER — BUDESONIDE 0.25 MG/2ML IN SUSP
0.2500 mg | Freq: Two times a day (BID) | RESPIRATORY_TRACT | Status: DC
  Administered 2021-04-23 – 2021-04-24 (×3): 0.25 mg via RESPIRATORY_TRACT
  Filled 2021-04-22 (×3): qty 2

## 2021-04-22 MED ORDER — PANTOPRAZOLE SODIUM 40 MG IV SOLR
40.0000 mg | Freq: Two times a day (BID) | INTRAVENOUS | Status: DC
  Administered 2021-04-22 – 2021-04-24 (×4): 40 mg via INTRAVENOUS
  Filled 2021-04-22 (×4): qty 40

## 2021-04-22 MED ORDER — ENOXAPARIN SODIUM 40 MG/0.4ML IJ SOSY
40.0000 mg | PREFILLED_SYRINGE | INTRAMUSCULAR | Status: DC
  Administered 2021-04-22 – 2021-04-23 (×2): 40 mg via SUBCUTANEOUS
  Filled 2021-04-22 (×2): qty 0.4

## 2021-04-22 MED ORDER — POTASSIUM CHLORIDE 10 MEQ/100ML IV SOLN
10.0000 meq | INTRAVENOUS | Status: DC
Start: 2021-04-22 — End: 2021-04-22
  Administered 2021-04-22 (×2): 10 meq via INTRAVENOUS
  Filled 2021-04-22 (×2): qty 100

## 2021-04-22 MED ORDER — IRBESARTAN 150 MG PO TABS
150.0000 mg | ORAL_TABLET | Freq: Every day | ORAL | Status: DC
  Administered 2021-04-22 – 2021-04-24 (×2): 150 mg via ORAL
  Filled 2021-04-22 (×3): qty 1

## 2021-04-22 MED ORDER — NITROGLYCERIN 0.4 MG SL SUBL: 0.4000 mg | SUBLINGUAL_TABLET | SUBLINGUAL | Status: DC | PRN

## 2021-04-22 MED ORDER — INSULIN ASPART 100 UNIT/ML IJ SOLN
0.0000 [IU] | Freq: Three times a day (TID) | INTRAMUSCULAR | Status: DC
  Administered 2021-04-24: 1 [IU] via SUBCUTANEOUS
  Filled 2021-04-22: qty 1

## 2021-04-22 MED ORDER — BUDESON-GLYCOPYRROL-FORMOTEROL 160-9-4.8 MCG/ACT IN AERO: 2.0000 | INHALATION_SPRAY | Freq: Two times a day (BID) | RESPIRATORY_TRACT | Status: DC

## 2021-04-22 MED ORDER — HYDRALAZINE HCL 20 MG/ML IJ SOLN: 5.0000 mg | INTRAMUSCULAR | Status: DC | PRN

## 2021-04-22 MED ORDER — METHOCARBAMOL 500 MG PO TABS: 500.0000 mg | ORAL_TABLET | Freq: Two times a day (BID) | ORAL | Status: DC | PRN

## 2021-04-22 MED ORDER — CLOPIDOGREL BISULFATE 75 MG PO TABS
75.0000 mg | ORAL_TABLET | Freq: Every day | ORAL | Status: DC
  Administered 2021-04-22 – 2021-04-24 (×3): 75 mg via ORAL
  Filled 2021-04-22 (×3): qty 1

## 2021-04-22 MED ORDER — ATORVASTATIN CALCIUM 20 MG PO TABS
80.0000 mg | ORAL_TABLET | Freq: Every day | ORAL | Status: DC
  Administered 2021-04-23: 80 mg via ORAL
  Filled 2021-04-22: qty 4

## 2021-04-22 MED ORDER — POLYETHYLENE GLYCOL 3350 17 G PO PACK
17.0000 g | PACK | Freq: Two times a day (BID) | ORAL | Status: DC
  Administered 2021-04-22 – 2021-04-24 (×5): 17 g via ORAL
  Filled 2021-04-22 (×5): qty 1

## 2021-04-22 MED ORDER — TRAMADOL HCL 50 MG PO TABS: 50.0000 mg | ORAL_TABLET | Freq: Four times a day (QID) | ORAL | Status: DC | PRN

## 2021-04-22 MED ORDER — LACTATED RINGERS IV SOLN: INTRAVENOUS | Status: DC

## 2021-04-22 MED ORDER — CAPSAICIN 0.025 % EX CREA
TOPICAL_CREAM | Freq: Once | CUTANEOUS | Status: AC
  Filled 2021-04-22: qty 60

## 2021-04-22 MED ORDER — MORPHINE SULFATE (PF) 4 MG/ML IV SOLN
6.0000 mg | Freq: Once | INTRAVENOUS | Status: AC
  Administered 2021-04-22: 6 mg via INTRAVENOUS
  Filled 2021-04-22: qty 2

## 2021-04-22 MED ORDER — MENTHOL 3 MG MT LOZG
1.0000 | LOZENGE | OROMUCOSAL | Status: DC | PRN
  Administered 2021-04-23: 3 mg via ORAL
  Filled 2021-04-22: qty 9

## 2021-04-22 MED ORDER — EZETIMIBE 10 MG PO TABS
10.0000 mg | ORAL_TABLET | Freq: Every day | ORAL | Status: DC
  Filled 2021-04-22 (×2): qty 1

## 2021-04-22 MED ORDER — ICOSAPENT ETHYL 1 G PO CAPS
2.0000 g | ORAL_CAPSULE | Freq: Two times a day (BID) | ORAL | Status: DC
  Administered 2021-04-23: 2 g via ORAL
  Filled 2021-04-22 (×5): qty 2

## 2021-04-22 MED ORDER — SODIUM CHLORIDE 0.9 % IV BOLUS
500.0000 mL | Freq: Once | INTRAVENOUS | Status: AC
  Administered 2021-04-22: 500 mL via INTRAVENOUS

## 2021-04-22 MED ORDER — LACTATED RINGERS IV BOLUS
1000.0000 mL | Freq: Once | INTRAVENOUS | Status: AC
  Administered 2021-04-22: 1000 mL via INTRAVENOUS

## 2021-04-22 MED ORDER — METOCLOPRAMIDE HCL 5 MG/ML IJ SOLN
10.0000 mg | Freq: Once | INTRAMUSCULAR | Status: AC
  Administered 2021-04-22: 10 mg via INTRAVENOUS
  Filled 2021-04-22: qty 2

## 2021-04-22 MED ORDER — DROPERIDOL 2.5 MG/ML IJ SOLN
1.2500 mg | Freq: Once | INTRAMUSCULAR | Status: AC
  Administered 2021-04-22: 1.25 mg via INTRAVENOUS
  Filled 2021-04-22: qty 2

## 2021-04-22 MED ORDER — PANTOPRAZOLE SODIUM 40 MG IV SOLR
40.0000 mg | Freq: Once | INTRAVENOUS | Status: AC
  Administered 2021-04-22: 40 mg via INTRAVENOUS
  Filled 2021-04-22: qty 40

## 2021-04-22 MED ORDER — ONDANSETRON HCL 4 MG/2ML IJ SOLN
4.0000 mg | Freq: Once | INTRAMUSCULAR | Status: AC
  Administered 2021-04-22: 4 mg via INTRAVENOUS
  Filled 2021-04-22: qty 2

## 2021-04-22 MED ORDER — AMLODIPINE BESYLATE 10 MG PO TABS
10.0000 mg | ORAL_TABLET | Freq: Every day | ORAL | Status: DC
  Administered 2021-04-22 – 2021-04-23 (×3): 10 mg via ORAL
  Filled 2021-04-22 (×3): qty 1

## 2021-04-22 MED ORDER — ALBUTEROL SULFATE (2.5 MG/3ML) 0.083% IN NEBU: 3.0000 mL | INHALATION_SOLUTION | RESPIRATORY_TRACT | Status: DC | PRN

## 2021-04-22 MED ORDER — ACETAMINOPHEN 325 MG PO TABS
650.0000 mg | ORAL_TABLET | Freq: Four times a day (QID) | ORAL | Status: DC | PRN
  Administered 2021-04-23: 650 mg via ORAL
  Filled 2021-04-22: qty 2

## 2021-04-22 MED ORDER — UMECLIDINIUM-VILANTEROL 62.5-25 MCG/INH IN AEPB
1.0000 | INHALATION_SPRAY | Freq: Every day | RESPIRATORY_TRACT | Status: DC
  Administered 2021-04-23 – 2021-04-24 (×2): 1 via RESPIRATORY_TRACT
  Filled 2021-04-22: qty 14

## 2021-04-22 MED ORDER — POTASSIUM CHLORIDE CRYS ER 20 MEQ PO TBCR
40.0000 meq | EXTENDED_RELEASE_TABLET | ORAL | Status: AC
Start: 2021-04-22 — End: 2021-04-22
  Administered 2021-04-22 (×2): 40 meq via ORAL
  Filled 2021-04-22 (×2): qty 2

## 2021-04-22 MED ORDER — PROMETHAZINE HCL 25 MG PO TABS
12.5000 mg | ORAL_TABLET | Freq: Four times a day (QID) | ORAL | Status: DC | PRN
  Administered 2021-04-22 – 2021-04-23 (×2): 12.5 mg via ORAL
  Filled 2021-04-22 (×2): qty 1

## 2021-04-22 MED ORDER — TRAZODONE HCL 50 MG PO TABS: 150.0000 mg | ORAL_TABLET | Freq: Every evening | ORAL | Status: DC | PRN

## 2021-04-22 MED ORDER — HYDROCHLOROTHIAZIDE 12.5 MG PO CAPS
12.5000 mg | ORAL_CAPSULE | Freq: Every day | ORAL | Status: DC
  Administered 2021-04-22 – 2021-04-24 (×3): 12.5 mg via ORAL
  Filled 2021-04-22 (×3): qty 1

## 2021-04-22 MED ORDER — LIDOCAINE VISCOUS HCL 2 % MT SOLN
15.0000 mL | Freq: Once | OROMUCOSAL | Status: AC
  Administered 2021-04-22: 15 mL via OROMUCOSAL
  Filled 2021-04-22: qty 15

## 2021-04-22 MED ORDER — INSULIN ASPART 100 UNIT/ML IJ SOLN: 0.0000 [IU] | Freq: Every day | INTRAMUSCULAR | Status: DC

## 2021-04-22 MED ORDER — ASPIRIN 81 MG PO CHEW
81.0000 mg | CHEWABLE_TABLET | Freq: Every day | ORAL | Status: DC
  Administered 2021-04-22 – 2021-04-24 (×3): 81 mg via ORAL
  Filled 2021-04-22 (×3): qty 1

## 2021-04-22 MED ORDER — IOHEXOL 350 MG/ML SOLN
80.0000 mL | Freq: Once | INTRAVENOUS | Status: AC | PRN
  Administered 2021-04-22: 80 mL via INTRAVENOUS
  Filled 2021-04-22: qty 80

## 2021-04-22 MED ORDER — ONDANSETRON HCL 4 MG/2ML IJ SOLN
4.0000 mg | Freq: Three times a day (TID) | INTRAMUSCULAR | Status: DC | PRN
  Administered 2021-04-22 – 2021-04-24 (×3): 4 mg via INTRAVENOUS
  Filled 2021-04-22 (×3): qty 2

## 2021-04-22 MED ORDER — POTASSIUM CHLORIDE 20 MEQ PO PACK
40.0000 meq | PACK | Freq: Two times a day (BID) | ORAL | Status: DC
  Administered 2021-04-22: 40 meq via ORAL
  Filled 2021-04-22: qty 2

## 2021-04-22 MED ORDER — METOCLOPRAMIDE HCL 5 MG/ML IJ SOLN
10.0000 mg | Freq: Three times a day (TID) | INTRAMUSCULAR | Status: DC
  Administered 2021-04-22 – 2021-04-24 (×5): 10 mg via INTRAVENOUS
  Filled 2021-04-22 (×5): qty 2

## 2021-04-22 NOTE — ED Provider Notes (Signed)
[facility] Emergency Department Provider Note  ____________________________________________   Event Date/Time   First MD Initiated Contact with Patient 04/22/21 1052     (approximate)  I have reviewed the triage vital signs and the nursing notes.   HISTORY  Chief Complaint Abdominal Pain   HPI Shawna Anderson is a 55 y.o. female with history of DM-2, bipolar disorder, GERD, chronic abdominal pain, HTN-marijuana use and recent admission 8/9-8/10 for intractable nausea and vomiting thought to be secondary to possibly THC use and gastroparesis and chronic constipation who presents via EMS from home for assessment of 3 to 4 days of unremitting nausea and vomiting associated with left-sided epigastric abdominal pain.  Patient states she has been constipated as well.  She states her throat feels raw from all the vomiting and she is a little soreness in her chest as well.  She endorses a mild cough over the last couple days.  He has no new headache, earache, fevers, back pain, lites abdominal pain, burning with urination rash or extremity pain.  No recent falls or injuries.  She denies any significant NSAID use, EtOH use or other illicit drug use.  No other acute concerns at this time.         Past Medical History:  Diagnosis Date   Anxiety    Asthma    Bipolar 1 disorder ([place])    Bipolar disorder ([employer])    CAD (coronary artery disease)    s/p stent BMS OM Cx   Cervical herniated disc 04/12/2016   COPD (chronic obstructive pulmonary disease) (HCC)    Depression    Diabetes mellitus without complication (HCC)    Diverticulitis    GERD (gastroesophageal reflux disease)    Glaucoma    History of blood transfusion    Hyperlipidemia    Hypertension    OSA (obstructive sleep apnea)    not using cpap    Plantar fasciitis    b/l feet s/p steroid shots w/o help and left surgery w/o help    UTI (urinary tract infection)     Patient Active Problem List    Diagnosis Date Noted   Polysubstance abuse ([place]) 04/22/2021   Hypertension    Epigastric abdominal pain    Intractable nausea and vomiting 02/19/2021   Epigastric pain 02/19/2021   SIRS (systemic inflammatory response syndrome) ([place]) 02/19/2021   High anion gap metabolic acidosis 81/07/7508   Elevated serum creatinine 02/19/2021   Diabetes mellitus without complication ([place])    Sepsis ([place]) 02/10/2021   Hypertension associated with diabetes ([place]) 01/23/2021   Obesity (BMI 30-39.9) 01/23/2021   Gastroparesis    Diverticulitis 01/01/2021   AKI (acute kidney injury) ([place]) 12/31/2020   Hypokalemia 12/31/2020   Transaminitis 12/31/2020   Acute diverticulitis 12/17/2020   Chronic pain syndrome 12/05/2020   Acute bronchitis with COPD ([place]) 11/28/2020   Cough 11/28/2020   Accelerating angina (HCC) 09/11/2020   Chronic nausea 03/26/2020   SUI (stress urinary incontinence, female) 03/26/2020   Bronchitis 03/26/2020   Continuous LLQ abdominal pain 03/01/2020   URI with cough and congestion 03/01/2020   Esophageal dysphagia 01/26/2020   Schatzki's ring of distal esophagus 01/26/2020   Ecchymosis 01/11/2020   Chronic pain of both knees 01/11/2020   Bruising 12/05/2019   Trichomonas infection 12/05/2019   Elevated liver enzymes 11/15/2019   Prediabetes 11/15/2019   STD exposure 11/15/2019   Anemia 11/15/2019   Chronic bursitis of right shoulder 10/25/2019   Chest wall pain 09/05/2019  Poor dentition 08/18/2019   Thrombocytosis 08/18/2019   Leukocytosis 08/18/2019   Anal lesion 08/18/2019   Female pelvic pain 05/26/2019   Colitis 05/26/2019   HLD (hyperlipidemia) 12/27/2018   Vitamin D deficiency 12/24/2018   Bipolar disorder ([place]) 12/30/2017   OSA (obstructive sleep apnea) 12/30/2017   Constipation 12/30/2017   Coronary artery disease of native artery of native heart with stable angina pectoris ([place]) 12/30/2017   Gastroesophageal reflux disease 12/30/2017   Asthma 12/29/2017    COPD (chronic obstructive pulmonary disease) ([place]) 12/29/2017   Anxiety and depression 12/29/2017   Insomnia 12/29/2017   Chronic back pain 12/29/2017   Skin lesion of back 12/29/2017   CAD S/P CFX PCI 2010 04/15/2016   Essential hypertension 04/15/2016   Hyperlipidemia associated with type 2 diabetes mellitus ([place]) 04/15/2016   Chest pain with moderate risk of acute coronary syndrome 04/12/2016   Chronic abdominal pain 01/12/2015   Vomiting 01/12/2015   Nausea and vomiting 01/12/2015    Past Surgical History:  Procedure Laterality Date   ABDOMINAL HYSTERECTOMY     ABDOMINAL SURGERY  1995   Bowel resection.   CARDIAC CATHETERIZATION N/A 04/14/2016   Procedure: Left Heart Cath and Coronary Angiography;  Surgeon: Burnell Blanks, MD;  Location: [place] [facility];  Service: Cardiovascular;  Laterality: N/A;   CARDIAC SURGERY     COLONOSCOPY WITH PROPOFOL N/A 07/11/2019   Procedure: COLONOSCOPY WITH PROPOFOL;  Surgeon: Jonathon Bellows, MD;  Location: [facility] ENDOSCOPY;  Service: Gastroenterology;  Laterality: N/A;   COLONOSCOPY WITH PROPOFOL N/A 07/29/2019   Procedure: COLONOSCOPY WITH PROPOFOL;  Surgeon: Jonathon Bellows, MD;  Location: [facility] ENDOSCOPY;  Service: Gastroenterology;  Laterality: N/A;   CORONARY ANGIOPLASTY WITH STENT PLACEMENT  2010   Drug eluting stent   ESOPHAGOGASTRODUODENOSCOPY (EGD) WITH PROPOFOL N/A 07/11/2019   Procedure: ESOPHAGOGASTRODUODENOSCOPY (EGD) WITH PROPOFOL;  Surgeon: Jonathon Bellows, MD;  Location: [facility] ENDOSCOPY;  Service: Gastroenterology;  Laterality: N/A;   LEFT HEART CATH AND CORONARY ANGIOGRAPHY Left 09/11/2020   Procedure: LEFT HEART CATH AND CORONARY ANGIOGRAPHY;  Surgeon: Nelva Bush, MD;  Location: [place] [facility];  Service: Cardiovascular;  Laterality: Left;   OVARIAN CYST REMOVAL      Prior to Admission medications   Medication Sig Start Date End Date Taking? Authorizing Provider  albuterol (PROAIR HFA) 108 (90 Base) MCG/ACT inhaler  Inhale 1-2 puffs into the lungs every 4 (four) hours as needed for wheezing or shortness of breath. 01/28/21   McLean-Scocuzza, Nino Glow, MD  amLODipine (NORVASC) 10 MG tablet TAKE 1 TABLET BY MOUTH AT  BEDTIME 04/19/21   McLean-Scocuzza, Nino Glow, MD  aspirin 81 MG chewable tablet Chew 81 mg by mouth daily.    [provider]  atorvastatin (LIPITOR) 80 MG tablet Take 1 tablet (80 mg total) by mouth daily at 6 PM. 04/16/20   McLean-Scocuzza, Nino Glow, MD  Budeson-Glycopyrrol-Formoterol (BREZTRI AEROSPHERE) 160-9-4.8 MCG/ACT AERO Inhale 2 puffs into the lungs in the morning and at bedtime. Rinse 12/05/20   McLean-Scocuzza, Nino Glow, MD  clopidogrel (PLAVIX) 75 MG tablet Take 1 tablet (75 mg total) by mouth daily. 04/16/20   McLean-Scocuzza, Nino Glow, MD  divalproex (DEPAKOTE) 500 MG DR tablet TAKE 1 TABLET BY MOUTH  TWICE DAILY 08/16/20   McLean-Scocuzza, Nino Glow, MD  ezetimibe (ZETIA) 10 MG tablet TAKE 1 TABLET BY MOUTH  DAILY WITH LIPITOR 80 MG AT NIGHT 11/26/20   McLean-Scocuzza, Nino Glow, MD  icosapent Ethyl (VASCEPA) 1 g capsule Take 2 capsules (2 g  total) by mouth 2 (two) times daily. 01/01/21   Pavero, Harrell Gave, RPH  isosorbide mononitrate (IMDUR) 60 MG 24 hr tablet Take 1 tablet (60 mg total) by mouth in the morning and at bedtime. Patient not taking: Reported on 03/11/2021 01/31/21 01/26/22  End, Harrell Gave, MD  methocarbamol (ROBAXIN) 500 MG tablet Take 1 tablet (500 mg total) by mouth 2 (two) times daily as needed for muscle spasms. 02/07/21   McLean-Scocuzza, Nino Glow, MD  metoCLOPramide (REGLAN) 5 MG tablet Take 1 tablet (5 mg total) by mouth 4 (four) times daily -  before meals and at bedtime. 03/11/21 04/10/21  Lin Landsman, MD  nitroGLYCERIN (NITROSTAT) 0.4 MG SL tablet DISSOLVE 1 TABLET UNDER  TONGUE EVERY 5 MINUTES AS  NEEDED FOR CHEST PAIN MAX  OF 3 TABLETS IN 15 MINUTES  . CALL 911 AFTER THIRD DOSE 11/26/20   Loel Dubonnet, NP  olmesartan-hydrochlorothiazide (BENICAR HCT) 20-12.5 MG  tablet Take 0.5 tablets by mouth daily. If  BP >130/>80 may increase to 1 pill qam. Stop 40-25 mg dose 02/10/21   McLean-Scocuzza, Nino Glow, MD  OZEMPIC, 0.25 OR 0.5 MG/DOSE, 2 MG/1.5ML SOPN Inject into the skin. Patient not taking: Reported on 03/11/2021 03/06/21   [provider]  pantoprazole (PROTONIX) 40 MG tablet TAKE 1 TABLET BY MOUTH  TWICE DAILY BEFORE MEALS 01/31/20   Jonathon Bellows, MD  REPATHA SURECLICK 017 MG/ML SOAJ Inject into the skin. 02/23/21   [provider]  traMADol (ULTRAM) 50 MG tablet Take 1 tablet (50 mg total) by mouth every 6 (six) hours as needed for moderate pain. 02/12/21   Fritzi Mandes, MD  traZODone (DESYREL) 150 MG tablet TAKE 1 TABLET BY MOUTH AT  BEDTIME AS NEEDED FOR SLEEP 04/19/21   McLean-Scocuzza, Nino Glow, MD  Vitamin D, Cholecalciferol, 25 MCG (1000 UT) CAPS Take 1 capsule by mouth daily.    [provider]    Allergies Ativan [lorazepam], Augmentin [amoxicillin-pot clavulanate], Latex, Tape, Xifaxan [rifaximin], and Drixoral allergy sinus [dexbromphen-pse-apap er]  Family History  Problem Relation Age of Onset   CAD Mother    Depression Mother    Heart disease Mother    Hyperlipidemia Mother    Hypertension Mother    Heart disease Father    Alcohol abuse Father    CAD Brother    Depression Brother    Diabetes Brother    Heart disease Brother    Hyperlipidemia Brother    Lupus Other    Sickle cell anemia Other     Social History Social History   Tobacco Use   Smoking status: Former    Packs/day: 1.00    Years: 30.00    Pack years: 30.00    Types: Cigarettes    Quit date: 12/31/2020    Years since quitting: 0.3   Smokeless tobacco: Never   Tobacco comments:    Quit after being admitted on 6/20 01/17/2021  Vaping Use   Vaping Use: Never used  Substance Use Topics   Alcohol use: Yes    Comment: once a year   Drug use: Yes    Frequency: 7.0 times per week    Types: Marijuana    Comment: 1 per day    Review of  Systems  Review of Systems  Constitutional:  Negative for chills and fever.  HENT:  Negative for sore throat.   Eyes:  Negative for pain.  Respiratory:  Positive for cough. Negative for stridor.   Cardiovascular:  Positive for chest pain.  Gastrointestinal:  Positive for abdominal pain, nausea and vomiting.  Genitourinary:  Positive for flank pain.  Musculoskeletal:  Negative for myalgias.  Skin:  Negative for rash.  Neurological:  Negative for seizures, loss of consciousness and headaches.  Psychiatric/Behavioral:  Positive for substance abuse. Negative for suicidal ideas.   All other systems reviewed and are negative.    ____________________________________________   PHYSICAL EXAM:  VITAL SIGNS: ED Triage Vitals  Enc Vitals Group     BP 04/22/21 1047 (!) 163/105     Pulse Rate 04/22/21 1047 99     Resp 04/22/21 1047 20     Temp 04/22/21 1047 (!) 97.5 F (36.4 C)     Temp Source 04/22/21 1047 Oral     SpO2 04/22/21 1047 96 %     Weight --      Height --      Head Circumference --      Peak Flow --      Pain Score 04/22/21 1019 7     Pain Loc --      Pain Edu? --      Excl. in [place]? --    Vitals:   04/22/21 1047 04/22/21 1440  BP: (!) 163/105 (!) 149/99  Pulse: 99 94  Resp: 20 20  Temp: (!) 97.5 F (36.4 C)   SpO2: 96% 99%   Physical Exam Vitals and nursing note reviewed.  Constitutional:      General: She is in acute distress.     Appearance: She is well-developed. She is ill-appearing.  HENT:     Head: Normocephalic and atraumatic.     Right Ear: External ear normal.     Left Ear: External ear normal.     Nose: Nose normal.     Mouth/Throat:     Mouth: Mucous membranes are dry.     Pharynx: Posterior oropharyngeal erythema present. No oropharyngeal exudate.  Eyes:     Conjunctiva/sclera: Conjunctivae normal.  Cardiovascular:     Rate and Rhythm: Normal rate and regular rhythm.     Pulses: Normal pulses.     Heart sounds: No murmur heard. Pulmonary:      Effort: Pulmonary effort is normal. No respiratory distress.     Breath sounds: Normal breath sounds.  Abdominal:     Palpations: Abdomen is soft.     Tenderness: There is abdominal tenderness in the epigastric area and left upper quadrant.  Musculoskeletal:     Cervical back: Neck supple.  Skin:    General: Skin is warm and dry.     Capillary Refill: Capillary refill takes 2 to 3 seconds.  Neurological:     Mental Status: She is alert and oriented to person, place, and time.  Psychiatric:        Mood and Affect: Mood normal.     ____________________________________________   LABS (all labs ordered are listed, but only abnormal results are displayed)  Labs Reviewed  COMPREHENSIVE METABOLIC PANEL - Abnormal; Notable for the following components:      Result Value   Sodium 134 (*)    Potassium 3.0 (*)    Chloride 89 (*)    CO2 34 (*)    Glucose, Bld 141 (*)    ALT 81 (*)    All other components within normal limits  CBC - Abnormal; Notable for the following components:   WBC 12.7 (*)    Platelets 485 (*)    All other components within normal limits  URINALYSIS, COMPLETE (UACMP) WITH  MICROSCOPIC - Abnormal; Notable for the following components:   Color, Urine YELLOW (*)    APPearance CLEAR (*)    Glucose, UA 50 (*)    Hgb urine dipstick SMALL (*)    All other components within normal limits  MAGNESIUM - Abnormal; Notable for the following components:   Magnesium 2.7 (*)    All other components within normal limits  URINE DRUG SCREEN, QUALITATIVE ([facility] ONLY) - Abnormal; Notable for the following components:   Cocaine Metabolite,Ur [place] POSITIVE (*)    Opiate, Ur Screen POSITIVE (*)    Cannabinoid 50 Ng, Ur Drain POSITIVE (*)    All other components within normal limits  RESP PANEL BY RT-PCR (FLU A&B, COVID) ARPGX2  LIPASE, BLOOD  PREGNANCY, URINE   ____________________________________________  EKG  ECG shows sinus rhythm with sinus rhythm with a ventricular rate  of 91, normal axis, unremarkable intervals with nonspecific change versus artifact in inferior leads without other clear evidence of acute ischemia or significant arrhythmia. ____________________________________________  RADIOLOGY  ED MD interpretation: Chest x-ray has no focal consolidation, effusion, edema, pneumothorax or any other clear acute thoracic process.  CT abdomen pelvis remarkable for some thickening of the lower esophagus flecking likely esophagitis without evidence of SBO, pancreatitis, cholecystitis, kidney stone or any other acute abdominopelvic process.  Official radiology report(s): CT ABDOMEN PELVIS W CONTRAST  Result Date: 04/22/2021 CLINICAL DATA:  Abdominal pain, acute, nonlocalized EXAM: CT ABDOMEN AND PELVIS WITH CONTRAST TECHNIQUE: Multidetector CT imaging of the abdomen and pelvis was performed using the standard protocol following bolus administration of intravenous contrast. CONTRAST:  16mL OMNIPAQUE IOHEXOL 350 MG/ML SOLN COMPARISON:  02/19/2021 FINDINGS: Lower chest: Persistent thickening of lower esophagus. Hepatobiliary: No focal liver abnormality is seen. No gallstones, gallbladder wall thickening, or biliary dilatation. Pancreas: Unremarkable. No pancreatic ductal dilatation or surrounding inflammatory changes. Spleen: Normal in size without focal abnormality. Adrenals/Urinary Tract: Adrenals, kidneys, and partially distended bladder are unremarkable. Stomach/Bowel: Stomach is within normal limits. Bowel is normal in caliber. There is an anastomosis at the level of the sigmoid. Sigmoid diverticulosis. Vascular/Lymphatic: Aortic atherosclerosis. No enlarged lymph nodes. Reproductive: Status post hysterectomy. No adnexal masses. Other: No free fluid.  No acute abnormality of the abdominal wall. Musculoskeletal: No acute osseous abnormality. IMPRESSION: No acute abnormality. Persistent thickening of lower esophagus, which may reflect esophagitis. Sigmoid diverticulosis.  Electronically Signed   By: Macy Mis M.D.   On: 04/22/2021 13:07   DG Chest Portable 1 View  Result Date: 04/22/2021 CLINICAL DATA:  Cough and chest pain.  Severe vomiting. EXAM: PORTABLE CHEST 1 VIEW COMPARISON:  02/19/2021 FINDINGS: The heart size and mediastinal contours are within normal limits. Both lungs are clear. The visualized skeletal structures are unremarkable. IMPRESSION: No active disease. Electronically Signed   By: Kerby Moors M.D.   On: 04/22/2021 12:32    ____________________________________________   PROCEDURES  Procedure(s) performed (including Critical Care):  .1-3 Lead EKG Interpretation Performed by: Lucrezia Starch, MD Authorized by: Lucrezia Starch, MD     Interpretation: normal     ECG rate assessment: normal     Rhythm: sinus rhythm     Ectopy: none     Conduction: normal     ____________________________________________   INITIAL IMPRESSION / ASSESSMENT AND PLAN / ED COURSE      Patient presents with above-stated history exam for assessment of cough, sore throat, some burning chest discomfort in the setting of reported use of embolism with vomiting and epigastric and left abdominal  pain similar to prior.  Patient endorses ongoing THC use states her home medications are not working.  On arrival she is hypertensive with otherwise stable vital signs on room air.  She is actively vomiting during initial exam and appears quite on the comfortable and dehydrated.  She is tender in the left upper quadrant left flank.  She has no CVA tenderness right-sided lower abdominal pain.  Differential includes recurrent episodes of nausea and vomiting related to [facility] use and gastroparesis acute infectious gastritis, kidney stone, pancreatitis, cholecystitis, SBO, constipation ulcer disease versus cystitis .  Suspect of burning subscribes in her throat and chest is related to her vomiting.  EKG and nonelevated troponin are not suggestive of ACS.  Her cough suspect  is related to possible scar of aspiration from vomiting versus a viral bronchitis.  She has no fever abnormal breath sounds or focal consolidations on chest x-ray to suggest bacterial pneumonia at the time.   ECG shows sinus rhythm with sinus rhythm with a ventricular rate of 91, normal axis, unremarkable intervals with nonspecific change versus artifact in inferior leads without other clear evidence of acute ischemia or significant arrhythmia.  Lipase 43 not consistent with acute pancreatitis.  CMP remarkable for K of 3, chloride of 89 without other significant electrolyte or metabolic derangements aside from ALT being slightly above baseline at 181.  No evidence of cholestasis.  CBC shows WBC count of 12.7 without evidence of acute anemia.  Magnesium is 2.7.  UDS is positive for cocaine, opiates and cannabis.  UA not consistent with cystitis.  Chest x-ray has no focal consolidation, effusion, edema, pneumothorax or any other clear acute thoracic process.  CT abdomen pelvis remarkable for some thickening of the lower esophagus flecking likely esophagitis without evidence of SBO, pancreatitis, cholecystitis, kidney stone or any other acute abdominopelvic process.  On reassessment after patient received below noted analgesia antiemetics patient stated she was feeling a little better but still nauseous and vomited up some ice chips she attempted to swallow.  I suspect likely hyperemesis related to gastritis possibly from ongoing THC abuse.  Given intractable nausea and vomiting admit to medicine service for further evaluation management.       ____________________________________________   FINAL CLINICAL IMPRESSION(S) / ED DIAGNOSES  Final diagnoses:  Nausea and vomiting, unspecified vomiting type  Epigastric pain  Polysubstance abuse (HCC)  Hypokalemia    Medications  potassium chloride SA (KLOR-CON) CR tablet 40 mEq (has no administration in time range)  pantoprazole (PROTONIX)  injection 40 mg (has no administration in time range)  lactated ringers infusion (has no administration in time range)  metoCLOPramide (REGLAN) injection 10 mg (has no administration in time range)  ondansetron (ZOFRAN) injection 4 mg (has no administration in time range)  promethazine (PHENERGAN) tablet 12.5 mg (has no administration in time range)  lactated ringers bolus 1,000 mL (has no administration in time range)  polyethylene glycol (MIRALAX / GLYCOLAX) packet 17 g (has no administration in time range)  senna-docusate (Senokot-S) tablet 1 tablet (has no administration in time range)  albuterol (PROVENTIL) (2.5 MG/3ML) 0.083% nebulizer solution 3 mL (has no administration in time range)  dextromethorphan-guaiFENesin (MUCINEX DM) 30-600 MG per 12 hr tablet 1 tablet (has no administration in time range)  acetaminophen (TYLENOL) tablet 650 mg (has no administration in time range)  hydrALAZINE (APRESOLINE) injection 5 mg (has no administration in time range)  ondansetron (ZOFRAN) injection 4 mg (4 mg Intravenous Given 04/22/21 1121)  morphine 4 MG/ML injection 6 mg (  6 mg Intravenous Given 04/22/21 1120)  lactated ringers bolus 1,000 mL (1,000 mLs Intravenous New Bag/Given 04/22/21 1121)  pantoprazole (PROTONIX) injection 40 mg (40 mg Intravenous Given 04/22/21 1129)  metoCLOPramide (REGLAN) injection 10 mg (10 mg Intravenous Given 04/22/21 1219)  sodium chloride 0.9 % bolus 500 mL (500 mLs Intravenous New Bag/Given 04/22/21 1353)  iohexol (OMNIPAQUE) 350 MG/ML injection 80 mL (80 mLs Intravenous Contrast Given 04/22/21 1236)  capsaicin (ZOSTRIX) 0.025 % cream ( Topical Given 04/22/21 1351)  droperidol (INAPSINE) 2.5 MG/ML injection 1.25 mg (1.25 mg Intravenous Given 04/22/21 1345)  lidocaine (XYLOCAINE) 2 % viscous mouth solution 15 mL (15 mLs Mouth/Throat Given 04/22/21 1505)     ED Discharge Orders     None        Note:  This document was prepared using Dragon voice recognition  software and may include unintentional dictation errors.    Lucrezia Starch, MD 04/22/21 [phone]

## 2021-04-22 NOTE — Telephone Encounter (Signed)
Repatha started by lipid clinic. Unsure of whether or not she is still taking. She is currently in ED for evaluation. Will F/U with patient tomorrow.

## 2021-04-22 NOTE — ED Triage Notes (Signed)
Pt comes with c/o severe vomiting and left side pain. Pt states this all started this am

## 2021-04-22 NOTE — H&P (Signed)
History and Physical    AVALEY COOP LYY:[id number] DOB: August 31, 1965 DOA: 04/22/2021  Referring MD/NP/PA:   PCP: McLean-Scocuzza, Nino Glow, MD   Patient coming from:  The patient is coming from home.  At baseline, pt is independent for most of ADL.        Chief Complaint: Intractable nausea, vomiting and epigastric abdominal pain  HPI: Shawna Anderson is a 55 y.o. female with medical history significant of hypertension, hyperlipidemia, prediabetes, COPD, asthma, GERD, depression with anxiety, bipolar disorder, OSA not on CPAP, diverticulitis, CAD, stent placement, presents with intractable nausea vomiting and epigastric abdominal pain.  Patient was recently hospitalized from 8/9-8/10 due to intractable nausea and vomiting, which was thought possibly due to marijuana abuse, constipation and gastroparesis.  In the past several days, patient developed intractable nausea and vomiting again. She states that she cannot any food down.  She also has epigastric abdominal pain.  She states that when she is swallowing she feels hurting along esophageal tract.  She has chronic mild cough, mild shortness breath which has not changed.  She is constipated.  No symptoms of UTI.  ED Course: pt was found to have WBC 12.7, UDS positive for cocaine, cannabinoid and opiate, negative urinalysis, negative COVID PCR, potassium 3.0, renal function okay, magnesium 2.7, temperature 97.5, blood pressure 149/99, heart rate 99, RR 20, oxygen saturation 96-99% on room air.  Chest x-ray negative.  CT abdomen/pelvis is negative for acute intra-abdominal issues, but showed possible esophagitis.  Patient is placed on MedSurg bed for observation.  Review of Systems:   General: no fevers, chills, no body weight gain, has poor appetite, has fatigue HEENT: no blurry vision, hearing changes or sore throat Respiratory: has dyspnea, coughing, no wheezing CV: no chest pain, no palpitations GI: has nausea, vomiting, abdominal  pain, constipation GU: no dysuria, burning on urination, increased urinary frequency, hematuria  Ext: no leg edema Neuro: no unilateral weakness, numbness, or tingling, no vision change or hearing loss Skin: no rash, no skin tear. MSK: No muscle spasm, no deformity, no limitation of range of movement in spin Heme: No easy bruising.  Travel history: No recent long distant travel.  Allergy:  Allergies  Allergen Reactions   Ativan [Lorazepam]     Pt states it makes her tongue do weird things    Augmentin [Amoxicillin-Pot Clavulanate]     Upset stomach    Latex Other (See Comments)    Patient stated that she was told by her doctor that she is "allergic to" this   Tape Other (See Comments)    Patient stated that she was told by her doctor that she is "allergic to" this   Xifaxan [Rifaximin]     Pt believes this is contributing to her abdominal pain/discomfort   Drixoral Allergy Sinus [Dexbromphen-Pse-Apap Er] Rash    Past Medical History:  Diagnosis Date   Anxiety    Asthma    Bipolar 1 disorder ([place])    Bipolar disorder ([place])    CAD (coronary artery disease)    s/p stent BMS OM Cx   Cervical herniated disc 04/12/2016   COPD (chronic obstructive pulmonary disease) ([place])    Depression    Diabetes mellitus without complication (HCC)    Diverticulitis    GERD (gastroesophageal reflux disease)    Glaucoma    History of blood transfusion    Hyperlipidemia    Hypertension    OSA (obstructive sleep apnea)    not using cpap    Plantar fasciitis  b/l feet s/p steroid shots w/o help and left surgery w/o help    UTI (urinary tract infection)     Past Surgical History:  Procedure Laterality Date   ABDOMINAL HYSTERECTOMY     ABDOMINAL SURGERY  1995   Bowel resection.   CARDIAC CATHETERIZATION N/A 04/14/2016   Procedure: Left Heart Cath and Coronary Angiography;  Surgeon: Burnell Blanks, MD;  Location: [facility];  Service: Cardiovascular;  Laterality: N/A;    CARDIAC SURGERY     COLONOSCOPY WITH PROPOFOL N/A 07/11/2019   Procedure: COLONOSCOPY WITH PROPOFOL;  Surgeon: Jonathon Bellows, MD;  Location: [facility] ENDOSCOPY;  Service: Gastroenterology;  Laterality: N/A;   COLONOSCOPY WITH PROPOFOL N/A 07/29/2019   Procedure: COLONOSCOPY WITH PROPOFOL;  Surgeon: Jonathon Bellows, MD;  Location: [facility] ENDOSCOPY;  Service: Gastroenterology;  Laterality: N/A;   CORONARY ANGIOPLASTY WITH STENT PLACEMENT  2010   Drug eluting stent   ESOPHAGOGASTRODUODENOSCOPY (EGD) WITH PROPOFOL N/A 07/11/2019   Procedure: ESOPHAGOGASTRODUODENOSCOPY (EGD) WITH PROPOFOL;  Surgeon: Jonathon Bellows, MD;  Location: [facility] ENDOSCOPY;  Service: Gastroenterology;  Laterality: N/A;   LEFT HEART CATH AND CORONARY ANGIOGRAPHY Left 09/11/2020   Procedure: LEFT HEART CATH AND CORONARY ANGIOGRAPHY;  Surgeon: Nelva Bush, MD;  Location: [facility];  Service: Cardiovascular;  Laterality: Left;   OVARIAN CYST REMOVAL      Social History:  reports that she quit smoking about 3 months ago. Her smoking use included cigarettes. She has a 30.00 pack-year smoking history. She has never used smokeless tobacco. She reports current alcohol use. She reports current drug use. Frequency: 7.00 times per week. Drug: Marijuana.  Family History:  Family History  Problem Relation Age of Onset   CAD Mother    Depression Mother    Heart disease Mother    Hyperlipidemia Mother    Hypertension Mother    Heart disease Father    Alcohol abuse Father    CAD Brother    Depression Brother    Diabetes Brother    Heart disease Brother    Hyperlipidemia Brother    Lupus Other    Sickle cell anemia Other      Prior to Admission medications   Medication Sig Start Date End Date Taking? Authorizing Provider  albuterol (PROAIR HFA) 108 (90 Base) MCG/ACT inhaler Inhale 1-2 puffs into the lungs every 4 (four) hours as needed for wheezing or shortness of breath. 01/28/21   McLean-Scocuzza, Nino Glow, MD  amLODipine (NORVASC)  10 MG tablet TAKE 1 TABLET BY MOUTH AT  BEDTIME 04/19/21   McLean-Scocuzza, Nino Glow, MD  aspirin 81 MG chewable tablet Chew 81 mg by mouth daily.    [provider]  atorvastatin (LIPITOR) 80 MG tablet Take 1 tablet (80 mg total) by mouth daily at 6 PM. 04/16/20   McLean-Scocuzza, Nino Glow, MD  Budeson-Glycopyrrol-Formoterol (BREZTRI AEROSPHERE) 160-9-4.8 MCG/ACT AERO Inhale 2 puffs into the lungs in the morning and at bedtime. Rinse 12/05/20   McLean-Scocuzza, Nino Glow, MD  clopidogrel (PLAVIX) 75 MG tablet Take 1 tablet (75 mg total) by mouth daily. 04/16/20   McLean-Scocuzza, Nino Glow, MD  divalproex (DEPAKOTE) 500 MG DR tablet TAKE 1 TABLET BY MOUTH  TWICE DAILY 08/16/20   McLean-Scocuzza, Nino Glow, MD  ezetimibe (ZETIA) 10 MG tablet TAKE 1 TABLET BY MOUTH  DAILY WITH LIPITOR 80 MG AT NIGHT 11/26/20   McLean-Scocuzza, Nino Glow, MD  icosapent Ethyl (VASCEPA) 1 g capsule Take 2 capsules (2 g total) by mouth 2 (two) times  daily. 01/01/21   Pavero, Harrell Gave, RPH  isosorbide mononitrate (IMDUR) 60 MG 24 hr tablet Take 1 tablet (60 mg total) by mouth in the morning and at bedtime. Patient not taking: Reported on 03/11/2021 01/31/21 01/26/22  End, Harrell Gave, MD  methocarbamol (ROBAXIN) 500 MG tablet Take 1 tablet (500 mg total) by mouth 2 (two) times daily as needed for muscle spasms. 02/07/21   McLean-Scocuzza, Nino Glow, MD  metoCLOPramide (REGLAN) 5 MG tablet Take 1 tablet (5 mg total) by mouth 4 (four) times daily -  before meals and at bedtime. 03/11/21 04/10/21  Lin Landsman, MD  nitroGLYCERIN (NITROSTAT) 0.4 MG SL tablet DISSOLVE 1 TABLET UNDER  TONGUE EVERY 5 MINUTES AS  NEEDED FOR CHEST PAIN MAX  OF 3 TABLETS IN 15 MINUTES  . CALL 911 AFTER THIRD DOSE 11/26/20   Loel Dubonnet, NP  olmesartan-hydrochlorothiazide (BENICAR HCT) 20-12.5 MG tablet Take 0.5 tablets by mouth daily. If  BP >130/>80 may increase to 1 pill qam. Stop 40-25 mg dose 02/10/21   McLean-Scocuzza, Nino Glow, MD  OZEMPIC, 0.25 OR  0.5 MG/DOSE, 2 MG/1.5ML SOPN Inject into the skin. Patient not taking: Reported on 03/11/2021 03/06/21   [provider]  pantoprazole (PROTONIX) 40 MG tablet TAKE 1 TABLET BY MOUTH  TWICE DAILY BEFORE MEALS 01/31/20   Jonathon Bellows, MD  REPATHA SURECLICK 301 MG/ML SOAJ Inject into the skin. 02/23/21   [provider]  traMADol (ULTRAM) 50 MG tablet Take 1 tablet (50 mg total) by mouth every 6 (six) hours as needed for moderate pain. 02/12/21   Fritzi Mandes, MD  traZODone (DESYREL) 150 MG tablet TAKE 1 TABLET BY MOUTH AT  BEDTIME AS NEEDED FOR SLEEP 04/19/21   McLean-Scocuzza, Nino Glow, MD  Vitamin D, Cholecalciferol, 25 MCG (1000 UT) CAPS Take 1 capsule by mouth daily.    [provider]    Physical Exam: Vitals:   04/22/21 1047 04/22/21 1108 04/22/21 1440 04/22/21 1636  BP: (!) 163/105  (!) 149/99 (!) 136/93  Pulse: 99  94 87  Resp: 20  20 20   Temp: (!) 97.5 F (36.4 C)   98.4 F (36.9 C)  TempSrc: Oral   Oral  SpO2: 96%  99% 97%  Weight:  81.8 kg    Height:  5\' 3"  (1.6 m)     General: Not in acute distress HEENT:       Eyes: PERRL, EOMI, no scleral icterus.       ENT: No discharge from the ears and nose, no pharynx injection, no tonsillar enlargement.        Neck: No JVD, no bruit, no mass felt. Heme: No neck lymph node enlargement. Cardiac: S1/S2, RRR, No murmurs, No gallops or rubs. Respiratory: No rales, wheezing, rhonchi or rubs. GI: Soft, nondistended, has tenderness in epigastric area, no rebound pain, no organomegaly, BS present. GU: No hematuria Ext: No pitting leg edema bilaterally. 1+DP/PT pulse bilaterally. Musculoskeletal: No joint deformities, No joint redness or warmth, no limitation of ROM in spin. Skin: No rashes.  Neuro: Alert, oriented X3, cranial nerves II-XII grossly intact, moves all extremities normally.  Psych: Patient is not psychotic, no suicidal or hemocidal ideation.  Labs on Admission: I have personally reviewed following labs and  imaging studies  CBC: Recent Labs  Lab 04/22/21 1047  WBC 12.7*  HGB 14.5  HCT 40.7  MCV 86.4  PLT 601*   Basic Metabolic Panel: Recent Labs  Lab 04/22/21 1047  NA 134*  K 3.0*  CL 89*  CO2 34*  GLUCOSE 141*  BUN 20  CREATININE 0.83  CALCIUM 9.3  MG 2.7*   GFR: Estimated Creatinine Clearance: 79.4 mL/min (by C-G formula based on SCr of 0.83 mg/dL). Liver Function Tests: Recent Labs  Lab 04/22/21 1047  AST 27  ALT 81*  ALKPHOS 86  BILITOT 0.7  PROT 7.1  ALBUMIN 4.1   Recent Labs  Lab 04/22/21 1047  LIPASE 43   No results for input(s): AMMONIA in the last 168 hours. Coagulation Profile: No results for input(s): INR, PROTIME in the last 168 hours. Cardiac Enzymes: No results for input(s): CKTOTAL, CKMB, CKMBINDEX, TROPONINI in the last 168 hours. BNP (last 3 results) No results for input(s): PROBNP in the last 8760 hours. HbA1C: No results for input(s): HGBA1C in the last 72 hours. CBG: Recent Labs  Lab 04/22/21 1724  GLUCAP 87   Lipid Profile: No results for input(s): CHOL, HDL, LDLCALC, TRIG, CHOLHDL, LDLDIRECT in the last 72 hours. Thyroid Function Tests: No results for input(s): TSH, T4TOTAL, FREET4, T3FREE, THYROIDAB in the last 72 hours. Anemia Panel: No results for input(s): VITAMINB12, FOLATE, FERRITIN, TIBC, IRON, RETICCTPCT in the last 72 hours. Urine analysis:    Component Value Date/Time   COLORURINE YELLOW (A) 04/22/2021 1117   APPEARANCEUR CLEAR (A) 04/22/2021 1117   APPEARANCEUR Turbid (A) 12/31/2017 0912   LABSPEC 1.009 04/22/2021 1117   LABSPEC 1.028 10/26/2014 1717   PHURINE 8.0 04/22/2021 1117   GLUCOSEU 50 (A) 04/22/2021 1117   GLUCOSEU Negative 10/26/2014 1717   HGBUR SMALL (A) 04/22/2021 1117   BILIRUBINUR NEGATIVE 04/22/2021 1117   BILIRUBINUR Negative 12/31/2017 0912   BILIRUBINUR Negative 10/26/2014 1717   KETONESUR NEGATIVE 04/22/2021 1117   PROTEINUR NEGATIVE 04/22/2021 1117   NITRITE NEGATIVE 04/22/2021 1117    LEUKOCYTESUR NEGATIVE 04/22/2021 1117   LEUKOCYTESUR Negative 10/26/2014 1717   Sepsis Labs: @LABRCNTIP (procalcitonin:4,lacticidven:4) ) Recent Results (from the past 240 hour(s))  Resp Panel by RT-PCR (Flu A&B, Covid) Nasopharyngeal Swab     Status: None   Collection Time: 04/22/21 11:17 AM   Specimen: Nasopharyngeal Swab; Nasopharyngeal(NP) swabs in vial transport medium  Result Value Ref Range Status   SARS Coronavirus 2 by RT PCR NEGATIVE NEGATIVE Final    Comment: (NOTE) SARS-CoV-2 target nucleic acids are NOT DETECTED.  The SARS-CoV-2 RNA is generally detectable in upper respiratory specimens during the acute phase of infection. The lowest concentration of SARS-CoV-2 viral copies this assay can detect is 138 copies/mL. A negative result does not preclude SARS-Cov-2 infection and should not be used as the sole basis for treatment or other patient management decisions. A negative result may occur with  improper specimen collection/handling, submission of specimen other than nasopharyngeal swab, presence of viral mutation(s) within the areas targeted by this assay, and inadequate number of viral copies(<138 copies/mL). A negative result must be combined with clinical observations, patient history, and epidemiological information. The expected result is Negative.  Fact Sheet for Patients:  EntrepreneurPulse.com.au  Fact Sheet for Healthcare Providers:  IncredibleEmployment.be  This test is no t yet approved or cleared by the [place] FDA and  has been authorized for detection and/or diagnosis of SARS-CoV-2 by FDA under an Emergency Use Authorization (EUA). This EUA will remain  in effect (meaning this test can be used) for the duration of the COVID-19 declaration under Section 564(b)(1) of the Act, 21 U.S.C.section 360bbb-3(b)(1), unless the authorization is terminated  or revoked sooner.       Influenza A by  PCR NEGATIVE NEGATIVE  Final   Influenza B by PCR NEGATIVE NEGATIVE Final    Comment: (NOTE) The Xpert Xpress SARS-CoV-2/FLU/RSV plus assay is intended as an aid in the diagnosis of influenza from Nasopharyngeal swab specimens and should not be used as a sole basis for treatment. Nasal washings and aspirates are unacceptable for Xpert Xpress SARS-CoV-2/FLU/RSV testing.  Fact Sheet for Patients: EntrepreneurPulse.com.au  Fact Sheet for Healthcare Providers: IncredibleEmployment.be  This test is not yet approved or cleared by the [place] FDA and has been authorized for detection and/or diagnosis of SARS-CoV-2 by FDA under an Emergency Use Authorization (EUA). This EUA will remain in effect (meaning this test can be used) for the duration of the COVID-19 declaration under Section 564(b)(1) of the Act, 21 U.S.C. section 360bbb-3(b)(1), unless the authorization is terminated or revoked.  Performed at [facility], [address] [place], [place] [address]      Radiological Exams on Admission: CT ABDOMEN PELVIS W CONTRAST  Result Date: 04/22/2021 CLINICAL DATA:  Abdominal pain, acute, nonlocalized EXAM: CT ABDOMEN AND PELVIS WITH CONTRAST TECHNIQUE: Multidetector CT imaging of the abdomen and pelvis was performed using the standard protocol following bolus administration of intravenous contrast. CONTRAST:  28mL OMNIPAQUE IOHEXOL 350 MG/ML SOLN COMPARISON:  02/19/2021 FINDINGS: Lower chest: Persistent thickening of lower esophagus. Hepatobiliary: No focal liver abnormality is seen. No gallstones, gallbladder wall thickening, or biliary dilatation. Pancreas: Unremarkable. No pancreatic ductal dilatation or surrounding inflammatory changes. Spleen: Normal in size without focal abnormality. Adrenals/Urinary Tract: Adrenals, kidneys, and partially distended bladder are unremarkable. Stomach/Bowel: Stomach is within normal limits. Bowel is normal in caliber. There is  an anastomosis at the level of the sigmoid. Sigmoid diverticulosis. Vascular/Lymphatic: Aortic atherosclerosis. No enlarged lymph nodes. Reproductive: Status post hysterectomy. No adnexal masses. Other: No free fluid.  No acute abnormality of the abdominal wall. Musculoskeletal: No acute osseous abnormality. IMPRESSION: No acute abnormality. Persistent thickening of lower esophagus, which may reflect esophagitis. Sigmoid diverticulosis. Electronically Signed   By: Macy Mis M.D.   On: 04/22/2021 13:07   DG Chest Portable 1 View  Result Date: 04/22/2021 CLINICAL DATA:  Cough and chest pain.  Severe vomiting. EXAM: PORTABLE CHEST 1 VIEW COMPARISON:  02/19/2021 FINDINGS: The heart size and mediastinal contours are within normal limits. Both lungs are clear. The visualized skeletal structures are unremarkable. IMPRESSION: No active disease. Electronically Signed   By: Kerby Moors M.D.   On: 04/22/2021 12:32     EKG: I have personally reviewed.  Sinus rhythm, QTC 457, LAE, poor R wave progression  Assessment/Plan Principal Problem:   Intractable nausea and vomiting Active Problems:   CAD S/P CFX PCI 2010   COPD (chronic obstructive pulmonary disease) (HCC)   Bipolar disorder (HCC)   Anxiety and depression   Gastroesophageal reflux disease   HLD (hyperlipidemia)   Leukocytosis   Diabetes mellitus with gastroparesis (HCC)   Hypokalemia   Polysubstance abuse (HCC)   Hypertension   Epigastric abdominal pain   Intractable nausea and vomiting and epigastric abdominal pain: Likely multifactorial etiology, including constipation, marijuana abuse, cocaine abuse, constipation possibly esophagitis.  -Placed on MedSurg bed for observation -Continue home tramadol -Reglan 10 mg 3 times daily, as needed Zofran, as needed Phenergan -Treat constipation: MiraLAX and senna code -Protonix 40 mg twice daily -IV fluid  CAD S/P CFX PCI 2010: S/p of BMS stent placement -Plavix, Zetia, Lipitor, -As  needed nitroglycerin  COPD (chronic obstructive pulmonary disease) (HCC) -Bronchodilators  Bipolar disorder and Anxiety  and depression -Depakote, trazodone  Gastroesophageal reflux disease -IV Protonix  HLD (hyperlipidemia) -Radial, Lipitor, -Patient is on Repatha  Leukocytosis: WBC 12.7, no signs of infection, likely reactive -Follow-up with CBC  Diabetes mellitus with gastroparesis ([place]): Recent A1c 6.2, poorly controlled.  Patient is on Ozempic -Sliding scale insulin  Hypokalemia: Potassium 3.0, magnesium 2.7 -Replete potassium  Polysubstance abuse ([place]) -Did counseling about importance of quitting substance  Hypertension -IV hydralazine as needed -Amlodipine       DVT ppx: SQ Lovenox Code Status: Full code Family Communication: not done, no family member is at bed side.  Disposition Plan:  Anticipate discharge back to previous environment Consults called:  none Admission status and Level of care: Med-Surg:  for obs.   Status is: Observation  The patient remains OBS appropriate and will d/c before 2 midnights.  Dispo: The patient is from: Home              Anticipated d/c is to: Home              Patient currently is not medically stable to d/c.   Difficult to place patient No         Date of Service 04/22/2021    [facility]   If 7PM-7AM, please contact night-coverage [email or web address] 04/22/2021, 6:05 PM

## 2021-04-23 DIAGNOSIS — F1990 Other psychoactive substance use, unspecified, uncomplicated: Secondary | ICD-10-CM | POA: Diagnosis not present

## 2021-04-23 DIAGNOSIS — F319 Bipolar disorder, unspecified: Secondary | ICD-10-CM | POA: Diagnosis not present

## 2021-04-23 DIAGNOSIS — R112 Nausea with vomiting, unspecified: Secondary | ICD-10-CM | POA: Diagnosis not present

## 2021-04-23 LAB — CBC
HCT: 35 % — ABNORMAL LOW (ref 36.0–46.0)
Hemoglobin: 11.9 g/dL — ABNORMAL LOW (ref 12.0–15.0)
MCH: 30.1 pg (ref 26.0–34.0)
MCHC: 34 g/dL (ref 30.0–36.0)
MCV: 88.4 fL (ref 80.0–100.0)
Platelets: 368 10*3/uL (ref 150–400)
RBC: 3.96 MIL/uL (ref 3.87–5.11)
RDW: 13.1 % (ref 11.5–15.5)
WBC: 9.2 10*3/uL (ref 4.0–10.5)
nRBC: 0 % (ref 0.0–0.2)

## 2021-04-23 LAB — BASIC METABOLIC PANEL
Anion gap: 7 (ref 5–15)
BUN: 9 mg/dL (ref 6–20)
CO2: 31 mmol/L (ref 22–32)
Calcium: 8.8 mg/dL — ABNORMAL LOW (ref 8.9–10.3)
Chloride: 100 mmol/L (ref 98–111)
Creatinine, Ser: 0.78 mg/dL (ref 0.44–1.00)
GFR, Estimated: >60 mL/min (ref >60)
Glucose, Bld: 119 mg/dL — ABNORMAL HIGH (ref 70–99)
Potassium: 3.6 mmol/L (ref 3.5–5.1)
Sodium: 138 mmol/L (ref 135–145)

## 2021-04-23 LAB — GLUCOSE, CAPILLARY
Glucose-Capillary: 112 mg/dL — ABNORMAL HIGH (ref 70–99)
Glucose-Capillary: 113 mg/dL — ABNORMAL HIGH (ref 70–99)
Glucose-Capillary: 99 mg/dL (ref 70–99)
Glucose-Capillary: 98 mg/dL (ref 70–99)

## 2021-04-23 MED ORDER — ADULT MULTIVITAMIN W/MINERALS CH
1.0000 | ORAL_TABLET | Freq: Every day | ORAL | Status: DC
  Administered 2021-04-24: 1 via ORAL
  Filled 2021-04-23: qty 1

## 2021-04-23 MED ORDER — EZETIMIBE 10 MG PO TABS
10.0000 mg | ORAL_TABLET | Freq: Every day | ORAL | Status: DC
  Administered 2021-04-23: 10 mg via ORAL
  Filled 2021-04-23: qty 1

## 2021-04-23 MED ORDER — ENSURE MAX PROTEIN PO LIQD
11.0000 [oz_av] | Freq: Two times a day (BID) | ORAL | Status: DC
  Administered 2021-04-23 – 2021-04-24 (×2): 11 [oz_av] via ORAL
  Filled 2021-04-23: qty 330

## 2021-04-23 NOTE — Evaluation (Signed)
Physical Therapy Evaluation Patient Details Name: Shawna Anderson MRN: [id number] DOB: 07/14/1966 Today's Date: 04/23/2021  History of Present Illness  Patient is a 55 y.o. female with medical history significant of hypertension, hyperlipidemia, prediabetes, COPD, asthma, GERD, depression with anxiety, bipolar disorder, OSA not on CPAP, diverticulitis, CAD, stent placement, presents with intractable nausea vomiting and epigastric abdominal pain. Though to be multifactorial etiology including constipation, marijuana abuse, cocaine abuse, constipation, possible esophagitis.  Clinical Impression  Patient agreeable to PT evaluation. She reports she was independent prior to admission.  Patient is independent with mobility in the room and has been getting to and from the bathroom without assistance. Patient ambulated 2 laps in hallway without assistive device and no loss of balance with direction changes, changing speed of ambulation, etc. Patient was mildly fatigued after walking with mild shortness of breath noted. Sp02 95% on room air after walking. Patient educated on energy conservation, walking for conditioning, and building activity slowly at home. No further acute PT needs at this time given high level of functional mobility and patient likely at her baseline level of functional mobility.       Recommendations for follow up therapy are one component of a multi-disciplinary discharge planning process, led by the attending physician.  Recommendations may be updated based on patient status, additional functional criteria and insurance authorization.  Follow Up Recommendations No PT follow up    Equipment Recommendations  None recommended by PT    Recommendations for Other Services       Precautions / Restrictions Precautions Precautions: None Restrictions Weight Bearing Restrictions: No      Mobility  Bed Mobility Overal bed mobility: Independent                   Transfers Overall transfer level: Independent                  Ambulation/Gait Ambulation/Gait assistance: Modified independent (Device/Increase time) Gait Distance (Feet): 350 Feet Assistive device: None Gait Pattern/deviations: Step-through pattern Gait velocity: normal   General Gait Details: patient demonstrated steady gait without loss of balance without assistive device. no difficulty with changing direction, altering gait speed. no increased pain is reported with activity. patient was mildly short of breath with activity with Sp02 95% on room air after walking  Stairs            Wheelchair Mobility    Modified Rankin (Stroke Patients Only)       Balance Overall balance assessment: Needs assistance Sitting-balance support: No upper extremity supported Sitting balance-Leahy Scale: Normal     Standing balance support: During functional activity;No upper extremity supported Standing balance-Leahy Scale: Normal                               Pertinent Vitals/Pain Pain Assessment: Faces Faces Pain Scale: Hurts a little bit Pain Location: abdomen Pain Descriptors / Indicators: Discomfort Pain Intervention(s): Limited activity within patient's tolerance    Home Living Family/patient expects to be discharged to:: Private residence Living Arrangements: Children Available Help at Discharge: Family Type of Home: Apartment Home Access:  (one step up)     Home Layout: One level        Prior Function Level of Independence: Independent               Hand Dominance        Extremity/Trunk Assessment   Upper Extremity Assessment Upper Extremity Assessment: Overall  WFL for tasks assessed    Lower Extremity Assessment Lower Extremity Assessment: Overall WFL for tasks assessed       Communication   Communication: No difficulties  Cognition Arousal/Alertness: Awake/alert Behavior During Therapy: WFL for tasks  assessed/performed Overall Cognitive Status: Within Functional Limits for tasks assessed                                        General Comments General comments (skin integrity, edema, etc.): educated patient on building activity slowly, walking for endurance/conditioning    Exercises     Assessment/Plan    PT Assessment Patent does not need any further PT services  PT Problem List         PT Treatment Interventions      PT Goals (Current goals can be found in the Care Plan section)  Acute Rehab PT Goals Patient Stated Goal: to go home PT Goal Formulation: With patient Time For Goal Achievement: 04/23/21 Potential to Achieve Goals: Good    Frequency     Barriers to discharge        Co-evaluation               AM-PAC PT "6 Clicks" Mobility  Outcome Measure Help needed turning from your back to your side while in a flat bed without using bedrails?: None Help needed moving from lying on your back to sitting on the side of a flat bed without using bedrails?: None Help needed moving to and from a bed to a chair (including a wheelchair)?: None Help needed standing up from a chair using your arms (e.g., wheelchair or bedside chair)?: None Help needed to walk in hospital room?: None Help needed climbing 3-5 steps with a railing? : None 6 Click Score: 24    End of Session   Activity Tolerance: Patient tolerated treatment well Patient left: in bed;with call bell/phone within reach   PT Visit Diagnosis: Muscle weakness (generalized) (M62.81)    Time: 0814-4818 PT Time Calculation (min) (ACUTE ONLY): 11 min   Charges:   PT Evaluation $PT Eval Low Complexity: 1 Low PT Treatments $Therapeutic Activity: 8-22 mins        Minna Merritts, PT, MPT   Percell Locus 04/23/2021, 11:04 AM

## 2021-04-23 NOTE — Telephone Encounter (Signed)
Patient has been admitted to hospital.

## 2021-04-23 NOTE — Progress Notes (Signed)
Initial Nutrition Assessment  DOCUMENTATION CODES:   Obesity unspecified  INTERVENTION:   Ensure Max protein supplement BID, each supplement provides 150kcal and 30g of protein.  MVI po daily   Liberalize diet   Pt at high refeed risk; recommend monitor potassium, magnesium and phosphorus labs daily until stable  Gastroparesis diet education   NUTRITION DIAGNOSIS:   Inadequate oral intake related to acute illness as evidenced by per patient/family report.  GOAL:   Patient will meet greater than or equal to 90% of their needs  MONITOR:   PO intake, Supplement acceptance, Labs, Weight trends, Skin, I & O's  REASON FOR ASSESSMENT:   Malnutrition Screening Tool    ASSESSMENT:   55 y.o. female with history of DM-2, bipolar disorder, GERD, chronic abdominal pain, HTN, substance abuse, COPD, CAD, OSA, depression and recent admission 8/9-8/10 for intractable nausea and vomiting who is admitted for recurrent nausea and vomiting.  Met with pt in room today. Pt reports decreased appetite and oral intake for the past 3-4 weeks pta r/t nausea and early satiety. Pt reports that she has been drinking Atkins shakes at home (vanilla or chocolate). Pt also reports that she cooks all of her vegetables and fruits at home prior to eating them. Pt reports that her appetite is slowly improving in hospital. Pt reports that for the first time in weeks, her stomach has started growling again. Pt reports that she has been taking it slow with her oral intake in hospital as she continues to experience early satiety and nausea after eating. Pt reports eating a potato without the skin and a 1/2 piece of chicken for lunch. RD discussed with pt the importance of adequate nutrition needed to preserve lean muscle; pt is willing to drink Ensure Max in hospital. Of note, pt reports that she is lactose intolerant and drinks Lactaid milk at home. RD also provided pt with gastroparesis diet education today. Pt  reports that she was taking Reglan at home and that this was helping but then she accidentally threw it in the trash so she has not been taking it for several days. RD will add supplements and MVI to help pt meet her estimated needs. Pt is at refeed risk.   Per chart, pt down 12lbs(6%) over the past 5 months; this is not significant.   Medications reviewed and include: aspirin, plavix, lovenox, insulin, reglan, protonix, senokot, miralax, LRS _0 /hr   Labs reviewed: K 3.6 wnl Cbgs- 112, 113 x 24 hrs AIC 6.2(H)- 8/9  NUTRITION - FOCUSED PHYSICAL EXAM:  Flowsheet Row Most Recent Value  Orbital Region No depletion  Upper Arm Region No depletion  Thoracic and Lumbar Region No depletion  Buccal Region No depletion  Temple Region No depletion  Clavicle Bone Region No depletion  Clavicle and Acromion Bone Region No depletion  Scapular Bone Region No depletion  Dorsal Hand No depletion  Patellar Region No depletion  Anterior Thigh Region No depletion  Posterior Calf Region No depletion  Edema (RD Assessment) None  Hair Reviewed  Eyes Reviewed  Mouth Reviewed  Skin Reviewed  Nails Reviewed   Diet Order:   Diet Order             Diet Carb Modified Fluid consistency: Thin; Room service appropriate? Yes  Diet effective now                  EDUCATION NEEDS:   Education needs have been addressed  Skin:  Skin Assessment: Reviewed RN Assessment (ecchymosis)  Last BM:  10/11- type 2  Height:   Ht Readings from Last 1 Encounters:  04/22/21 _0  (1.6 m)    Weight:   Wt Readings from Last 1 Encounters:  04/22/21 81.8 kg    Ideal Body Weight:  52.3 kg  BMI:  Body mass index is 31.95 kg/m.  Estimated Nutritional Needs:   Kcal:  1700-1900kcal/day  Protein:  85-95g/day  Fluid:  1.5-1.8L/day  Koleen Distance MS, RD, LDN Please refer to [facility] for RD and/or RD on-call/weekend/after hours pager

## 2021-04-23 NOTE — Progress Notes (Addendum)
PROGRESS NOTE    HPI was taken from Dr. Blaine Hamper: Shawna Anderson is a 55 y.o. female with medical history significant of hypertension, hyperlipidemia, prediabetes, COPD, asthma, GERD, depression with anxiety, bipolar disorder, OSA not on CPAP, diverticulitis, CAD, stent placement, presents with intractable nausea vomiting and epigastric abdominal pain.   Patient was recently hospitalized from 8/9-8/10 due to intractable nausea and vomiting, which was thought possibly due to marijuana abuse, constipation and gastroparesis.  In the past several days, patient developed intractable nausea and vomiting again. She states that she cannot any food down.  She also has epigastric abdominal pain.  She states that when she is swallowing she feels hurting along esophageal tract.  She has chronic mild cough, mild shortness breath which has not changed.  She is constipated.  No symptoms of UTI.   ED Course: pt was found to have WBC 12.7, UDS positive for cocaine, cannabinoid and opiate, negative urinalysis, negative COVID PCR, potassium 3.0, renal function okay, magnesium 2.7, temperature 97.5, blood pressure 149/99, heart rate 99, RR 20, oxygen saturation 96-99% on room air.  Chest x-ray negative.  CT abdomen/pelvis is negative for acute intra-abdominal issues, but showed possible esophagitis.  Patient is placed on MedSurg bed for observation.   Shawna Anderson  VOH:[id number] DOB: December 19, 1965 DOA: 04/22/2021 PCP: McLean-Scocuzza, Nino Glow, MD   Assessment & Plan:   Principal Problem:   Intractable nausea and vomiting Active Problems:   CAD S/P CFX PCI 2010   COPD (chronic obstructive pulmonary disease) (HCC)   Bipolar disorder (HCC)   Anxiety and depression   Gastroesophageal reflux disease   HLD (hyperlipidemia)   Leukocytosis   Diabetes mellitus with gastroparesis (HCC)   Hypokalemia   Polysubstance abuse ([place])   Hypertension   Epigastric abdominal pain   Abd pain: w/ nausea & vomiting.  Likely multifactorial etiology, including constipation, marijuana abuse, cocaine abuse, possibly esophagitis. Continue on PPI, IVFs.  Zofran, phenergan & reglan prn. CT abd/plevis shows sigmoid diverticulosis, persistent thickening of lower esophagus, which may reflect esophagitis.   Illicit drug use: drug scree positive for cocaine, marijuana. Pt admits to marijuana use but denies cocaine use. Illicit drug use cessation counseling    Hx of CAD: s/p of BMS stent placement. Continue on statin, zetia & plavix.   COPD: w/o exacerbation. Continue on bronchodilators    Bipolar disorder: severity and/or type unknown. Continue on depakote. Does not see a psychiatrist anymore.    GERD: continue on PPI    HLD: continue on statin. On repatha at home    Leukocytosis: resolved    DM2: HbA1c 6.2,well controlled. Continue on SSI w/ accuchecks    Hypokalemia: WNL today    Polysubstance abuse: drug screen was positive for cocaine, marijuana, opiates    HTN: continue on home dose of amlodipine. IV hydralazine     DVT prophylaxis: lovenox  Code Status: full  Family Communication:  Disposition Plan: likely d/c back home   Level of care: Med-Surg  Status is: Observation  The patient remains OBS appropriate and will d/c before 2 midnights.  Dispo: The patient is from: Home              Anticipated d/c is to: Home              Patient currently is not medically stable to d/c.   Difficult to place patient : unclear   Consultants:    Procedures:   Antimicrobials:    Subjective: Pt c/o nausea  Objective: Vitals:   04/22/21 1636 04/22/21 2001 04/23/21 0339 04/23/21 0758  BP: (!) 136/93 (!) 146/80 (!) 149/79 (!) 136/92  Pulse: 87 78 75 74  Resp: 20 18 18 18   Temp: 98.4 F (36.9 C) 97.9 F (36.6 C) 98.3 F (36.8 C) 98.1 F (36.7 C)  TempSrc: Oral Oral Oral Oral  SpO2: 97% 98% 99% 97%  Weight:      Height:        Intake/Output Summary (Last 24 hours) at 04/23/2021  0825 Last data filed at 04/23/2021 0739 Gross per 24 hour  Intake 3154.68 ml  Output 350 ml  Net 2804.68 ml   Filed Weights   04/22/21 1108  Weight: 81.8 kg    Examination:  General exam: Appears calm and comfortable. Appears agitated   Respiratory system: Clear to auscultation. Respiratory effort normal. Cardiovascular system: S1 & S2 +. No  rubs, gallops or clicks.  Gastrointestinal system: Abdomen is obese, soft and nontender. Normal bowel sounds heard. Central nervous system: Alert and oriented. Moves all extremities  Psychiatry: Judgement and insight appear normal. Tangential thinking    Data Reviewed: I have personally reviewed following labs and imaging studies  CBC: Recent Labs  Lab 04/22/21 1047 04/23/21 0432  WBC 12.7* 9.2  HGB 14.5 11.9*  HCT 40.7 35.0*  MCV 86.4 88.4  PLT 485* 703   Basic Metabolic Panel: Recent Labs  Lab 04/22/21 1047 04/23/21 0432  NA 134* 138  K 3.0* 3.6  CL 89* 100  CO2 34* 31  GLUCOSE 141* 119*  BUN 20 9  CREATININE 0.83 0.78  CALCIUM 9.3 8.8*  MG 2.7*  --    GFR: Estimated Creatinine Clearance: 82.4 mL/min (by C-G formula based on SCr of 0.78 mg/dL). Liver Function Tests: Recent Labs  Lab 04/22/21 1047  AST 27  ALT 81*  ALKPHOS 86  BILITOT 0.7  PROT 7.1  ALBUMIN 4.1   Recent Labs  Lab 04/22/21 1047  LIPASE 43   No results for input(s): AMMONIA in the last 168 hours. Coagulation Profile: No results for input(s): INR, PROTIME in the last 168 hours. Cardiac Enzymes: No results for input(s): CKTOTAL, CKMB, CKMBINDEX, TROPONINI in the last 168 hours. BNP (last 3 results) No results for input(s): PROBNP in the last 8760 hours. HbA1C: No results for input(s): HGBA1C in the last 72 hours. CBG: Recent Labs  Lab 04/22/21 1724 04/22/21 2120 04/23/21 0756  GLUCAP 87 189* 112*   Lipid Profile: No results for input(s): CHOL, HDL, LDLCALC, TRIG, CHOLHDL, LDLDIRECT in the last 72 hours. Thyroid Function  Tests: No results for input(s): TSH, T4TOTAL, FREET4, T3FREE, THYROIDAB in the last 72 hours. Anemia Panel: No results for input(s): VITAMINB12, FOLATE, FERRITIN, TIBC, IRON, RETICCTPCT in the last 72 hours. Sepsis Labs: No results for input(s): PROCALCITON, LATICACIDVEN in the last 168 hours.  Recent Results (from the past 240 hour(s))  Resp Panel by RT-PCR (Flu A&B, Covid) Nasopharyngeal Swab     Status: None   Collection Time: 04/22/21 11:17 AM   Specimen: Nasopharyngeal Swab; Nasopharyngeal(NP) swabs in vial transport medium  Result Value Ref Range Status   SARS Coronavirus 2 by RT PCR NEGATIVE NEGATIVE Final    Comment: (NOTE) SARS-CoV-2 target nucleic acids are NOT DETECTED.  The SARS-CoV-2 RNA is generally detectable in upper respiratory specimens during the acute phase of infection. The lowest concentration of SARS-CoV-2 viral copies this assay can detect is 138 copies/mL. A negative result does not preclude SARS-Cov-2 infection and should not be  used as the sole basis for treatment or other patient management decisions. A negative result may occur with  improper specimen collection/handling, submission of specimen other than nasopharyngeal swab, presence of viral mutation(s) within the areas targeted by this assay, and inadequate number of viral copies(<138 copies/mL). A negative result must be combined with clinical observations, patient history, and epidemiological information. The expected result is Negative.  Fact Sheet for Patients:  EntrepreneurPulse.com.au  Fact Sheet for Healthcare Providers:  IncredibleEmployment.be  This test is no t yet approved or cleared by the [place] FDA and  has been authorized for detection and/or diagnosis of SARS-CoV-2 by FDA under an Emergency Use Authorization (EUA). This EUA will remain  in effect (meaning this test can be used) for the duration of the COVID-19 declaration under Section  564(b)(1) of the Act, 21 U.S.C.section 360bbb-3(b)(1), unless the authorization is terminated  or revoked sooner.       Influenza A by PCR NEGATIVE NEGATIVE Final   Influenza B by PCR NEGATIVE NEGATIVE Final    Comment: (NOTE) The Xpert Xpress SARS-CoV-2/FLU/RSV plus assay is intended as an aid in the diagnosis of influenza from Nasopharyngeal swab specimens and should not be used as a sole basis for treatment. Nasal washings and aspirates are unacceptable for Xpert Xpress SARS-CoV-2/FLU/RSV testing.  Fact Sheet for Patients: EntrepreneurPulse.com.au  Fact Sheet for Healthcare Providers: IncredibleEmployment.be  This test is not yet approved or cleared by the [place] FDA and has been authorized for detection and/or diagnosis of SARS-CoV-2 by FDA under an Emergency Use Authorization (EUA). This EUA will remain in effect (meaning this test can be used) for the duration of the COVID-19 declaration under Section 564(b)(1) of the Act, 21 U.S.C. section 360bbb-3(b)(1), unless the authorization is terminated or revoked.  Performed at [facility], [address] [place], [place] [address]          Radiology Studies: CT ABDOMEN PELVIS W CONTRAST  Result Date: 04/22/2021 CLINICAL DATA:  Abdominal pain, acute, nonlocalized EXAM: CT ABDOMEN AND PELVIS WITH CONTRAST TECHNIQUE: Multidetector CT imaging of the abdomen and pelvis was performed using the standard protocol following bolus administration of intravenous contrast. CONTRAST:  54mL OMNIPAQUE IOHEXOL 350 MG/ML SOLN COMPARISON:  02/19/2021 FINDINGS: Lower chest: Persistent thickening of lower esophagus. Hepatobiliary: No focal liver abnormality is seen. No gallstones, gallbladder wall thickening, or biliary dilatation. Pancreas: Unremarkable. No pancreatic ductal dilatation or surrounding inflammatory changes. Spleen: Normal in size without focal abnormality. Adrenals/Urinary  Tract: Adrenals, kidneys, and partially distended bladder are unremarkable. Stomach/Bowel: Stomach is within normal limits. Bowel is normal in caliber. There is an anastomosis at the level of the sigmoid. Sigmoid diverticulosis. Vascular/Lymphatic: Aortic atherosclerosis. No enlarged lymph nodes. Reproductive: Status post hysterectomy. No adnexal masses. Other: No free fluid.  No acute abnormality of the abdominal wall. Musculoskeletal: No acute osseous abnormality. IMPRESSION: No acute abnormality. Persistent thickening of lower esophagus, which may reflect esophagitis. Sigmoid diverticulosis. Electronically Signed   By: Macy Mis M.D.   On: 04/22/2021 13:07   DG Chest Portable 1 View  Result Date: 04/22/2021 CLINICAL DATA:  Cough and chest pain.  Severe vomiting. EXAM: PORTABLE CHEST 1 VIEW COMPARISON:  02/19/2021 FINDINGS: The heart size and mediastinal contours are within normal limits. Both lungs are clear. The visualized skeletal structures are unremarkable. IMPRESSION: No active disease. Electronically Signed   By: Kerby Moors M.D.   On: 04/22/2021 12:32        Scheduled Meds:  amLODipine  10 mg Oral  QHS   aspirin  81 mg Oral Daily   atorvastatin  80 mg Oral q1800   budesonide (PULMICORT) nebulizer solution  0.25 mg Nebulization BID   clopidogrel  75 mg Oral Daily   divalproex  500 mg Oral BID   enoxaparin (LOVENOX) injection  40 mg Subcutaneous Q24H   ezetimibe  10 mg Oral Daily   irbesartan  150 mg Oral Daily   And   hydrochlorothiazide  12.5 mg Oral Daily   icosapent Ethyl  2 g Oral BID   insulin aspart  0-5 Units Subcutaneous QHS   insulin aspart  0-9 Units Subcutaneous TID WC   metoCLOPramide (REGLAN) injection  10 mg Intravenous Q8H   pantoprazole (PROTONIX) IV  40 mg Intravenous Q12H   polyethylene glycol  17 g Oral BID   senna-docusate  1 tablet Oral BID   umeclidinium-vilanterol  1 puff Inhalation Daily   Continuous Infusions:  lactated ringers 100 mL/hr at  04/23/21 0739     LOS: 0 days    Time spent: 33 mins     Wyvonnia Dusky, MD [facility] Pager [phone]  If 7PM-7AM, please contact night-coverage 04/23/2021, 8:25 AM

## 2021-04-24 DIAGNOSIS — D72829 Elevated white blood cell count, unspecified: Secondary | ICD-10-CM | POA: Diagnosis present

## 2021-04-24 DIAGNOSIS — Z811 Family history of alcohol abuse and dependence: Secondary | ICD-10-CM | POA: Diagnosis not present

## 2021-04-24 DIAGNOSIS — Z83438 Family history of other disorder of lipoprotein metabolism and other lipidemia: Secondary | ICD-10-CM | POA: Diagnosis not present

## 2021-04-24 DIAGNOSIS — R112 Nausea with vomiting, unspecified: Secondary | ICD-10-CM | POA: Diagnosis present

## 2021-04-24 DIAGNOSIS — E876 Hypokalemia: Secondary | ICD-10-CM | POA: Diagnosis present

## 2021-04-24 DIAGNOSIS — K5730 Diverticulosis of large intestine without perforation or abscess without bleeding: Secondary | ICD-10-CM | POA: Diagnosis present

## 2021-04-24 DIAGNOSIS — Z20822 Contact with and (suspected) exposure to covid-19: Secondary | ICD-10-CM | POA: Diagnosis present

## 2021-04-24 DIAGNOSIS — E860 Dehydration: Secondary | ICD-10-CM | POA: Diagnosis present

## 2021-04-24 DIAGNOSIS — I10 Essential (primary) hypertension: Secondary | ICD-10-CM | POA: Diagnosis present

## 2021-04-24 DIAGNOSIS — Z8249 Family history of ischemic heart disease and other diseases of the circulatory system: Secondary | ICD-10-CM | POA: Diagnosis not present

## 2021-04-24 DIAGNOSIS — E785 Hyperlipidemia, unspecified: Secondary | ICD-10-CM | POA: Diagnosis present

## 2021-04-24 DIAGNOSIS — J449 Chronic obstructive pulmonary disease, unspecified: Secondary | ICD-10-CM | POA: Diagnosis present

## 2021-04-24 DIAGNOSIS — K3184 Gastroparesis: Secondary | ICD-10-CM | POA: Diagnosis present

## 2021-04-24 DIAGNOSIS — E1143 Type 2 diabetes mellitus with diabetic autonomic (poly)neuropathy: Secondary | ICD-10-CM | POA: Diagnosis present

## 2021-04-24 DIAGNOSIS — F1410 Cocaine abuse, uncomplicated: Secondary | ICD-10-CM | POA: Diagnosis present

## 2021-04-24 DIAGNOSIS — F319 Bipolar disorder, unspecified: Secondary | ICD-10-CM | POA: Diagnosis present

## 2021-04-24 DIAGNOSIS — F419 Anxiety disorder, unspecified: Secondary | ICD-10-CM | POA: Diagnosis present

## 2021-04-24 DIAGNOSIS — Z818 Family history of other mental and behavioral disorders: Secondary | ICD-10-CM | POA: Diagnosis not present

## 2021-04-24 DIAGNOSIS — F1210 Cannabis abuse, uncomplicated: Secondary | ICD-10-CM | POA: Diagnosis present

## 2021-04-24 DIAGNOSIS — K5900 Constipation, unspecified: Secondary | ICD-10-CM | POA: Diagnosis present

## 2021-04-24 DIAGNOSIS — Z833 Family history of diabetes mellitus: Secondary | ICD-10-CM | POA: Diagnosis not present

## 2021-04-24 DIAGNOSIS — Z832 Family history of diseases of the blood and blood-forming organs and certain disorders involving the immune mechanism: Secondary | ICD-10-CM | POA: Diagnosis not present

## 2021-04-24 DIAGNOSIS — I2510 Atherosclerotic heart disease of native coronary artery without angina pectoris: Secondary | ICD-10-CM | POA: Diagnosis present

## 2021-04-24 DIAGNOSIS — K2100 Gastro-esophageal reflux disease with esophagitis, without bleeding: Secondary | ICD-10-CM | POA: Diagnosis present

## 2021-04-24 LAB — COMPREHENSIVE METABOLIC PANEL
ALT: 43 U/L (ref 0–44)
AST: 19 U/L (ref 15–41)
Albumin: 3.5 g/dL (ref 3.5–5.0)
Alkaline Phosphatase: 61 U/L (ref 38–126)
Anion gap: 10 (ref 5–15)
BUN: 9 mg/dL (ref 6–20)
CO2: 29 mmol/L (ref 22–32)
Calcium: 9.4 mg/dL (ref 8.9–10.3)
Chloride: 99 mmol/L (ref 98–111)
Creatinine, Ser: 0.66 mg/dL (ref 0.44–1.00)
GFR, Estimated: >60 mL/min (ref >60)
Glucose, Bld: 108 mg/dL — ABNORMAL HIGH (ref 70–99)
Potassium: 3.8 mmol/L (ref 3.5–5.1)
Sodium: 138 mmol/L (ref 135–145)
Total Bilirubin: 0.5 mg/dL (ref 0.3–1.2)
Total Protein: 6.2 g/dL — ABNORMAL LOW (ref 6.5–8.1)

## 2021-04-24 LAB — CBC
HCT: 38.1 % (ref 36.0–46.0)
Hemoglobin: 12.7 g/dL (ref 12.0–15.0)
MCH: 29.3 pg (ref 26.0–34.0)
MCHC: 33.3 g/dL (ref 30.0–36.0)
MCV: 87.8 fL (ref 80.0–100.0)
Platelets: 399 10*3/uL (ref 150–400)
RBC: 4.34 MIL/uL (ref 3.87–5.11)
RDW: 13.1 % (ref 11.5–15.5)
WBC: 8.4 10*3/uL (ref 4.0–10.5)
nRBC: 0 % (ref 0.0–0.2)

## 2021-04-24 LAB — GLUCOSE, CAPILLARY
Glucose-Capillary: 123 mg/dL — ABNORMAL HIGH (ref 70–99)
Glucose-Capillary: 134 mg/dL — ABNORMAL HIGH (ref 70–99)

## 2021-04-24 LAB — MAGNESIUM: Magnesium: 1.8 mg/dL (ref 1.7–2.4)

## 2021-04-24 MED ORDER — ONDANSETRON 4 MG PO TBDP: 4.0000 mg | ORAL_TABLET | Freq: Three times a day (TID) | ORAL | 0 refills | Status: DC | PRN

## 2021-04-24 MED ORDER — PANTOPRAZOLE SODIUM 40 MG PO TBEC: 40.0000 mg | DELAYED_RELEASE_TABLET | Freq: Two times a day (BID) | ORAL | Status: DC

## 2021-04-24 MED ORDER — METOCLOPRAMIDE HCL 5 MG PO TABS: 5.0000 mg | ORAL_TABLET | Freq: Three times a day (TID) | ORAL | 0 refills | Status: DC

## 2021-04-24 NOTE — Discharge Summary (Signed)
Physician Discharge Summary   CHATARA Anderson  female DOB: Apr 03, 1966  STM:[id number]  PCP: McLean-Scocuzza, Shawna Glow, MD  Admit date: 04/22/2021 Discharge date: 04/24/2021  Admitted From: home Disposition:  home CODE STATUS: Full code  Discharge Instructions     Discharge instructions   Complete by: As directed    Since your nausea and vomiting have improved, and you can eat and feel ready to go home, you are discharged to follow up with your outpatient GI doctor as scheduled.  Reglan and zofran refilled for 1 months, per your request.   Dr. Enzo Bi [facility] Course:  For full details, please see H&P, progress notes, consult notes and ancillary notes.  Briefly,  Shawna Anderson is a 55 y.o. female with medical history significant of hypertension, hyperlipidemia, prediabetes, COPD, asthma, GERD, depression with anxiety, bipolar disorder, OSA not on CPAP, diverticulitis, CAD, stent placement, presented with intractable nausea vomiting and epigastric abdominal pain.   Patient was recently hospitalized from 8/9-8/10 due to intractable nausea and vomiting, which was thought possibly due to marijuana abuse, constipation and gastroparesis.  In the past several days, patient developed intractable nausea and vomiting again. She states that she cannot any food down.  She also has epigastric abdominal pain.  She states that when she is swallowing she feels hurting along esophageal tract.   Abd pain: w/ nausea & vomiting.  Likely multifactorial etiology, including constipation, marijuana abuse, cocaine abuse, possibly esophagitis.  --CT abd/plevis shows sigmoid diverticulosis, persistent thickening of lower esophagus, which may reflect esophagitis.  --Pt received scheduled IV reglan, IV PPI, IVFs, Zofran and phenergan.   --symptoms improved prior to discharge, and pt was able to tolerate diet and felt ready to go home.  Pt will continue followup with her regular  outpatient GI.  Illicit drug use:  Pt admits to marijuana use but denies cocaine use.  UDS positive for cocaine, marijuana and opiates.   Hx of CAD: s/p of BMS stent placement. Continue on statin, zetia & plavix.   COPD: w/o exacerbation.  Continue on bronchodilators    Bipolar disorder: severity and/or type unknown.  Does not see a psychiatrist anymore.  Continue on depakote.    GERD:  continue on PPI    HLD:  On repatha at home.  Pt stopped Vascepa because it induces vomiting.   Leukocytosis: resolved    DM2: HbA1c 6.2, well controlled.  SSI w/ accuchecks while inpatient, discharged back to home Ozempic.   Hypokalemia:  --monitor and replete PRN   HTN:  continue on home BP regimen (see below)    Discharge Diagnoses:  Principal Problem:   Intractable nausea and vomiting Active Problems:   CAD S/P CFX PCI 2010   COPD (chronic obstructive pulmonary disease) (HCC)   Bipolar disorder (HCC)   Anxiety and depression   Gastroesophageal reflux disease   HLD (hyperlipidemia)   Leukocytosis   Diabetes mellitus with gastroparesis (HCC)   Hypokalemia   Polysubstance abuse (HCC)   Hypertension   Epigastric abdominal pain   Nausea & vomiting     Discharge Instructions:  Allergies as of 04/24/2021       Reactions   Ativan [lorazepam]    Pt states it makes her tongue do weird things    Augmentin [amoxicillin-pot Clavulanate]    Upset stomach   Lactose Intolerance (gi)    Latex Other (See Comments)   Patient stated that she was told by her doctor  that she is "allergic to" this   Tape Other (See Comments)   Patient stated that she was told by her doctor that she is "allergic to" this   Xifaxan [rifaximin]    Pt believes this is contributing to her abdominal pain/discomfort   Drixoral Allergy Sinus [dexbromphen-pse-apap Er] Rash        Medication List     STOP taking these medications    cyclobenzaprine 10 MG tablet Commonly known as: FLEXERIL    icosapent Ethyl 1 g capsule Commonly known as: Vascepa   Vitamin D (Cholecalciferol) 25 MCG (1000 UT) Caps       TAKE these medications    albuterol 108 (90 Base) MCG/ACT inhaler Commonly known as: ProAir HFA Inhale 1-2 puffs into the lungs every 4 (four) hours as needed for wheezing or shortness of breath.   amLODipine 10 MG tablet Commonly known as: NORVASC TAKE 1 TABLET BY MOUTH AT  BEDTIME   aspirin 81 MG chewable tablet Chew 81 mg by mouth daily.   atorvastatin 80 MG tablet Commonly known as: LIPITOR Take 1 tablet (80 mg total) by mouth daily at 6 PM.   Breztri Aerosphere 160-9-4.8 MCG/ACT Aero Generic drug: Budeson-Glycopyrrol-Formoterol Inhale 2 puffs into the lungs in the morning and at bedtime. Rinse   clopidogrel 75 MG tablet Commonly known as: PLAVIX Take 1 tablet (75 mg total) by mouth daily.   divalproex 500 MG DR tablet Commonly known as: DEPAKOTE TAKE 1 TABLET BY MOUTH  TWICE DAILY   ezetimibe 10 MG tablet Commonly known as: ZETIA TAKE 1 TABLET BY MOUTH  DAILY WITH LIPITOR 80 MG AT NIGHT   isosorbide mononitrate 60 MG 24 hr tablet Commonly known as: IMDUR Take 1 tablet (60 mg total) by mouth in the morning and at bedtime.   methocarbamol 500 MG tablet Commonly known as: Robaxin Take 1 tablet (500 mg total) by mouth 2 (two) times daily as needed for muscle spasms.   metoCLOPramide 5 MG tablet Commonly known as: Reglan Take 1 tablet (5 mg total) by mouth 4 (four) times daily -  before meals and at bedtime.   nitroGLYCERIN 0.4 MG SL tablet Commonly known as: NITROSTAT DISSOLVE 1 TABLET UNDER  TONGUE EVERY 5 MINUTES AS  NEEDED FOR CHEST PAIN MAX  OF 3 TABLETS IN 15 MINUTES  . CALL 911 AFTER THIRD DOSE   olmesartan-hydrochlorothiazide 20-12.5 MG tablet Commonly known as: BENICAR HCT Take 0.5 tablets by mouth daily. If  BP >130/>80 may increase to 1 pill qam. Stop 40-25 mg dose   ondansetron 4 MG disintegrating tablet Commonly known as: Zofran  ODT Take 1 tablet (4 mg total) by mouth every 8 (eight) hours as needed for nausea or vomiting.   Ozempic (0.25 or 0.5 MG/DOSE) 2 MG/1.5ML Sopn Generic drug: Semaglutide(0.25 or 0.5MG /DOS) Inject into the skin.   pantoprazole 40 MG tablet Commonly known as: PROTONIX TAKE 1 TABLET BY MOUTH  TWICE DAILY BEFORE MEALS   Repatha SureClick 263 MG/ML Soaj Generic drug: Evolocumab INJECT 140MG  SUBCUTANEOUSLY EVERY 2 WEEKS What changed: See the new instructions.   traMADol 50 MG tablet Commonly known as: ULTRAM Take 1 tablet (50 mg total) by mouth every 6 (six) hours as needed for moderate pain.   traZODone 150 MG tablet Commonly known as: DESYREL TAKE 1 TABLET BY MOUTH AT  BEDTIME AS NEEDED FOR SLEEP         Follow-up Information     McLean-Scocuzza, Shawna Glow, MD Follow up in 1 week(s).  Specialty: Internal Medicine Contact information: [address] [place] [phone]         Your outpatient GI doctor Follow up.                  Allergies  Allergen Reactions   Ativan [Lorazepam]     Pt states it makes her tongue do weird things    Augmentin [Amoxicillin-Pot Clavulanate]     Upset stomach    Lactose Intolerance (Gi)    Latex Other (See Comments)    Patient stated that she was told by her doctor that she is "allergic to" this   Tape Other (See Comments)    Patient stated that she was told by her doctor that she is "allergic to" this   Xifaxan [Rifaximin]     Pt believes this is contributing to her abdominal pain/discomfort   Drixoral Allergy Sinus [Dexbromphen-Pse-Apap Er] Rash     The results of significant diagnostics from this hospitalization (including imaging, microbiology, ancillary and laboratory) are listed below for reference.   Consultations:   Procedures/Studies: CT ABDOMEN PELVIS W CONTRAST  Result Date: 04/22/2021 CLINICAL DATA:  Abdominal pain, acute, nonlocalized EXAM: CT ABDOMEN AND PELVIS WITH CONTRAST  TECHNIQUE: Multidetector CT imaging of the abdomen and pelvis was performed using the standard protocol following bolus administration of intravenous contrast. CONTRAST:  37mL OMNIPAQUE IOHEXOL 350 MG/ML SOLN COMPARISON:  02/19/2021 FINDINGS: Lower chest: Persistent thickening of lower esophagus. Hepatobiliary: No focal liver abnormality is seen. No gallstones, gallbladder wall thickening, or biliary dilatation. Pancreas: Unremarkable. No pancreatic ductal dilatation or surrounding inflammatory changes. Spleen: Normal in size without focal abnormality. Adrenals/Urinary Tract: Adrenals, kidneys, and partially distended bladder are unremarkable. Stomach/Bowel: Stomach is within normal limits. Bowel is normal in caliber. There is an anastomosis at the level of the sigmoid. Sigmoid diverticulosis. Vascular/Lymphatic: Aortic atherosclerosis. No enlarged lymph nodes. Reproductive: Status post hysterectomy. No adnexal masses. Other: No free fluid.  No acute abnormality of the abdominal wall. Musculoskeletal: No acute osseous abnormality. IMPRESSION: No acute abnormality. Persistent thickening of lower esophagus, which may reflect esophagitis. Sigmoid diverticulosis. Electronically Signed   By: Macy Mis M.D.   On: 04/22/2021 13:07   DG Chest Portable 1 View  Result Date: 04/22/2021 CLINICAL DATA:  Cough and chest pain.  Severe vomiting. EXAM: PORTABLE CHEST 1 VIEW COMPARISON:  02/19/2021 FINDINGS: The heart size and mediastinal contours are within normal limits. Both lungs are clear. The visualized skeletal structures are unremarkable. IMPRESSION: No active disease. Electronically Signed   By: Kerby Moors M.D.   On: 04/22/2021 12:32      Labs: BNP (last 3 results) No results for input(s): BNP in the last 8760 hours. Basic Metabolic Panel: Recent Labs  Lab 04/22/21 1047 04/23/21 0432 04/24/21 0711  NA 134* 138 138  K 3.0* 3.6 3.8  CL 89* 100 99  CO2 34* 31 29  GLUCOSE 141* 119* 108*  BUN 20 9  9   CREATININE 0.83 0.78 0.66  CALCIUM 9.3 8.8* 9.4  MG 2.7*  --  1.8   Liver Function Tests: Recent Labs  Lab 04/22/21 1047 04/24/21 0711  AST 27 19  ALT 81* 43  ALKPHOS 86 61  BILITOT 0.7 0.5  PROT 7.1 6.2*  ALBUMIN 4.1 3.5   Recent Labs  Lab 04/22/21 1047  LIPASE 43   No results for input(s): AMMONIA in the last 168 hours. CBC: Recent Labs  Lab 04/22/21 1047 04/23/21 0432 04/24/21 0711  WBC 12.7* 9.2 8.4  HGB 14.5 11.9* 12.7  HCT 40.7 35.0* 38.1  MCV 86.4 88.4 87.8  PLT 485* 368 399   Cardiac Enzymes: No results for input(s): CKTOTAL, CKMB, CKMBINDEX, TROPONINI in the last 168 hours. BNP: Invalid input(s): POCBNP CBG: Recent Labs  Lab 04/23/21 1200 04/23/21 1650 04/23/21 2052 04/24/21 0801 04/24/21 1146  GLUCAP 113* 99 98 123* 134*   D-Dimer No results for input(s): DDIMER in the last 72 hours. Hgb A1c No results for input(s): HGBA1C in the last 72 hours. Lipid Profile No results for input(s): CHOL, HDL, LDLCALC, TRIG, CHOLHDL, LDLDIRECT in the last 72 hours. Thyroid function studies No results for input(s): TSH, T4TOTAL, T3FREE, THYROIDAB in the last 72 hours.  Invalid input(s): FREET3 Anemia work up No results for input(s): VITAMINB12, FOLATE, FERRITIN, TIBC, IRON, RETICCTPCT in the last 72 hours. Urinalysis    Component Value Date/Time   COLORURINE YELLOW (A) 04/22/2021 1117   APPEARANCEUR CLEAR (A) 04/22/2021 1117   APPEARANCEUR Turbid (A) 12/31/2017 0912   LABSPEC 1.009 04/22/2021 1117   LABSPEC 1.028 10/26/2014 1717   PHURINE 8.0 04/22/2021 1117   GLUCOSEU 50 (A) 04/22/2021 1117   GLUCOSEU Negative 10/26/2014 1717   HGBUR SMALL (A) 04/22/2021 1117   BILIRUBINUR NEGATIVE 04/22/2021 1117   BILIRUBINUR Negative 12/31/2017 0912   BILIRUBINUR Negative 10/26/2014 1717   KETONESUR NEGATIVE 04/22/2021 1117   PROTEINUR NEGATIVE 04/22/2021 1117   NITRITE NEGATIVE 04/22/2021 1117   LEUKOCYTESUR NEGATIVE 04/22/2021 1117   LEUKOCYTESUR  Negative 10/26/2014 1717   Sepsis Labs Invalid input(s): PROCALCITONIN,  WBC,  LACTICIDVEN Microbiology Recent Results (from the past 240 hour(s))  Resp Panel by RT-PCR (Flu A&B, Covid) Nasopharyngeal Swab     Status: None   Collection Time: 04/22/21 11:17 AM   Specimen: Nasopharyngeal Swab; Nasopharyngeal(NP) swabs in vial transport medium  Result Value Ref Range Status   SARS Coronavirus 2 by RT PCR NEGATIVE NEGATIVE Final    Comment: (NOTE) SARS-CoV-2 target nucleic acids are NOT DETECTED.  The SARS-CoV-2 RNA is generally detectable in upper respiratory specimens during the acute phase of infection. The lowest concentration of SARS-CoV-2 viral copies this assay can detect is 138 copies/mL. A negative result does not preclude SARS-Cov-2 infection and should not be used as the sole basis for treatment or other patient management decisions. A negative result may occur with  improper specimen collection/handling, submission of specimen other than nasopharyngeal swab, presence of viral mutation(s) within the areas targeted by this assay, and inadequate number of viral copies(<138 copies/mL). A negative result must be combined with clinical observations, patient history, and epidemiological information. The expected result is Negative.  Fact Sheet for Patients:  EntrepreneurPulse.com.au  Fact Sheet for Healthcare Providers:  IncredibleEmployment.be  This test is no t yet approved or cleared by the [place] FDA and  has been authorized for detection and/or diagnosis of SARS-CoV-2 by FDA under an Emergency Use Authorization (EUA). This EUA will remain  in effect (meaning this test can be used) for the duration of the COVID-19 declaration under Section 564(b)(1) of the Act, 21 U.S.C.section 360bbb-3(b)(1), unless the authorization is terminated  or revoked sooner.       Influenza A by PCR NEGATIVE NEGATIVE Final   Influenza B by PCR  NEGATIVE NEGATIVE Final    Comment: (NOTE) The Xpert Xpress SARS-CoV-2/FLU/RSV plus assay is intended as an aid in the diagnosis of influenza from Nasopharyngeal swab specimens and should not be used as a sole basis for treatment. Nasal washings and aspirates are unacceptable  for Xpert Xpress SARS-CoV-2/FLU/RSV testing.  Fact Sheet for Patients: EntrepreneurPulse.com.au  Fact Sheet for Healthcare Providers: IncredibleEmployment.be  This test is not yet approved or cleared by the [place] FDA and has been authorized for detection and/or diagnosis of SARS-CoV-2 by FDA under an Emergency Use Authorization (EUA). This EUA will remain in effect (meaning this test can be used) for the duration of the COVID-19 declaration under Section 564(b)(1) of the Act, 21 U.S.C. section 360bbb-3(b)(1), unless the authorization is terminated or revoked.  Performed at [facility], [address] [place], [place] [address]      Total time spend on discharging this patient, including the last patient exam, discussing the hospital stay, instructions for ongoing care as it relates to all pertinent caregivers, as well as preparing the medical discharge records, prescriptions, and/or referrals as applicable, is 35 minutes.    Enzo Bi, MD  [facility] 04/24/2021, 1:34 PM

## 2021-04-24 NOTE — Progress Notes (Signed)
Mobility Specialist - Progress Note   04/24/21 1400  Mobility  Activity Ambulated in hall  Level of Assistance Independent  Assistive Device None  Distance Ambulated (ft) 360 ft  Mobility Ambulated independently in hallway  Mobility Response Tolerated well  Mobility performed by Mobility specialist  $Mobility charge 1 Mobility    Pt ambulated in hallway independently. No complaints.    Kathee Delton Mobility Specialist 04/24/21, 2:01 PM

## 2021-04-24 NOTE — Plan of Care (Signed)
Patient states feeling better and having less nausea/vomitting and would like to be discharged.  Discharge to home. PIV removed prior to d/c.  Discharge instructions reviewed and given to patient who verbalized understanding.

## 2021-04-24 NOTE — Telephone Encounter (Signed)
Spoke with patient who states she is taking Repatha but is going to stop Vascepa because it induces vomiting. She would like to have this removed this from her med list.

## 2021-04-25 ENCOUNTER — Telehealth: Payer: Self-pay

## 2021-04-25 NOTE — Telephone Encounter (Signed)
Transition Care Management Unsuccessful Follow-up Telephone Call  Date of discharge and from where:  04/24/21-[facility]  Attempts:  2nd Attempt  Reason for unsuccessful TCM follow-up call:  Left voice message. Will follow.

## 2021-04-25 NOTE — Telephone Encounter (Signed)
Transition Care Management Unsuccessful Follow-up Telephone Call  Date of discharge and from where:  04/24/2021 [facility]   Attempts:  1st Attempt  Reason for unsuccessful TCM follow-up call:  No answer/busy   Needs PCP  Hospital follow-up appointment x 1 week   L.Kainalu Heggs,LPN

## 2021-04-29 NOTE — Progress Notes (Signed)
Cardiology Office Note:    Date:  04/30/2021   ID:  Shawna Anderson, DOB 1966/01/05, MRN [id number]  PCP:  McLean-Scocuzza, Nino Glow, MD  [facility] Cardiologist:  Nelva Bush, MD  [facility] Electrophysiologist:  None   Referring MD: McLean-Scocuzza, Olivia Mackie *   Chief Complaint: 1 month follow-up  History of Present Illness:    Shawna Anderson is a 55 y.o. female with a hx of CAD (2010 BMS to LCx: 2017 restenosis of LCx), HTN, HLD, GERD, sleep apnea, asthma, current smoker with both tobacco and marijuana, bipolar/anxiety presents today for CAD follow-up.  CAD was initially discovered during preop work-up for prior ovarian cyst removal in 2010. BMS placed ostial/proximal LCx. She was admitted to [facility] 03/1016 for chest pain after being off multiple medications due to financial constraints. Catheterization showed moderate in-stent restenosis of the ostial/proximal left circumflex stent. Recommended for medical management. She has had multiple office visits since that time reporting stable anginal symptoms with continued medication adjustments. She does use nitroglycerin as needed. Imdur 30 mg daily was added to her office visit in December due to anginal symptoms and cardiac catheterization was offered though she declined.   Seen in follow up 01/31/20. Noted to have been seen by Dr. Vicente Males of GI the day prior and recommended for barium swallow due to dysphagia. She endorsed reduced frequency of chest pain since addition of Imdur.  Continued to smoke, but had purchased nicotine patches and is planning to quit. BP at home has been checked intermittently and labile though noted taking medication at very different times each day.   She had a gastric emptying study which was slightly delayed. Continues to follow with GI.  Last seen 08/30/20 and reported atypical chest pain. Imdur was increased to 60mg  BID.   Admitted 10/10-10/12 for nausea and vomiting. Ridging showed possible  esophagitis, sigmoid diverticulitis. UDS positive for cocaine, marijuana, opiates.  Treated COPD exacerbation  Today, the patient reports she has been having chest discomfort. She is not sure if it is acid reflux or heart pain. IT is in the center of the chest, a sharp pain. Occurs when she relaxed. Not worse on exertion. She is having daily episodes. Can last 5-7 minutes. NTG helps the pain. She is taking protonix. She had unchanged shortness of breath. No LLE, orthopnea, pnd, palpitations. She says she smokes marijuana occasionally. Says she doesn't use cocaine. Does not currently have chest pain.    Past Medical History:  Diagnosis Date   Anxiety    Asthma    Bipolar 1 disorder ([place])    Bipolar disorder ([place])    CAD (coronary artery disease)    s/p stent BMS OM Cx   Cervical herniated disc 04/12/2016   COPD (chronic obstructive pulmonary disease) (HCC)    Depression    Diabetes mellitus without complication (HCC)    Diverticulitis    GERD (gastroesophageal reflux disease)    Glaucoma    History of blood transfusion    Hyperlipidemia    Hypertension    OSA (obstructive sleep apnea)    not using cpap    Plantar fasciitis    b/l feet s/p steroid shots w/o help and left surgery w/o help    UTI (urinary tract infection)     Past Surgical History:  Procedure Laterality Date   ABDOMINAL HYSTERECTOMY     ABDOMINAL SURGERY  1995   Bowel resection.   CARDIAC CATHETERIZATION N/A 04/14/2016   Procedure: Left Heart Cath and Coronary  Angiography;  Surgeon: Burnell Blanks, MD;  Location: [place];  Service: Cardiovascular;  Laterality: N/A;   CARDIAC SURGERY     COLONOSCOPY WITH PROPOFOL N/A 07/11/2019   Procedure: COLONOSCOPY WITH PROPOFOL;  Surgeon: Jonathon Bellows, MD;  Location: [facility] ENDOSCOPY;  Service: Gastroenterology;  Laterality: N/A;   COLONOSCOPY WITH PROPOFOL N/A 07/29/2019   Procedure: COLONOSCOPY WITH PROPOFOL;  Surgeon: Jonathon Bellows, MD;  Location: [facility]  ENDOSCOPY;  Service: Gastroenterology;  Laterality: N/A;   CORONARY ANGIOPLASTY WITH STENT PLACEMENT  2010   Drug eluting stent   ESOPHAGOGASTRODUODENOSCOPY (EGD) WITH PROPOFOL N/A 07/11/2019   Procedure: ESOPHAGOGASTRODUODENOSCOPY (EGD) WITH PROPOFOL;  Surgeon: Jonathon Bellows, MD;  Location: [facility] ENDOSCOPY;  Service: Gastroenterology;  Laterality: N/A;   LEFT HEART CATH AND CORONARY ANGIOGRAPHY Left 09/11/2020   Procedure: LEFT HEART CATH AND CORONARY ANGIOGRAPHY;  Surgeon: Nelva Bush, MD;  Location: [place] [facility];  Service: Cardiovascular;  Laterality: Left;   OVARIAN CYST REMOVAL      Current Medications: Current Meds  Medication Sig   albuterol (PROAIR HFA) 108 (90 Base) MCG/ACT inhaler Inhale 1-2 puffs into the lungs every 4 (four) hours as needed for wheezing or shortness of breath.   amLODipine (NORVASC) 10 MG tablet TAKE 1 TABLET BY MOUTH AT  BEDTIME   aspirin 81 MG chewable tablet Chew 81 mg by mouth daily.   atorvastatin (LIPITOR) 80 MG tablet Take 1 tablet (80 mg total) by mouth daily at 6 PM.   Budeson-Glycopyrrol-Formoterol (BREZTRI AEROSPHERE) 160-9-4.8 MCG/ACT AERO Inhale 2 puffs into the lungs in the morning and at bedtime. Rinse   clopidogrel (PLAVIX) 75 MG tablet Take 1 tablet (75 mg total) by mouth daily.   divalproex (DEPAKOTE) 500 MG DR tablet TAKE 1 TABLET BY MOUTH  TWICE DAILY   ezetimibe (ZETIA) 10 MG tablet TAKE 1 TABLET BY MOUTH  DAILY WITH LIPITOR 80 MG AT NIGHT   isosorbide mononitrate (IMDUR) 30 MG 24 hr tablet Take 3 tablets (90 mg) by mouth once daily   methocarbamol (ROBAXIN) 500 MG tablet Take 1 tablet (500 mg total) by mouth 2 (two) times daily as needed for muscle spasms.   metoCLOPramide (REGLAN) 5 MG tablet Take 1 tablet (5 mg total) by mouth 4 (four) times daily -  before meals and at bedtime.   nitroGLYCERIN (NITROSTAT) 0.4 MG SL tablet DISSOLVE 1 TABLET UNDER  TONGUE EVERY 5 MINUTES AS  NEEDED FOR CHEST PAIN MAX  OF 3 TABLETS IN 15 MINUTES  .  CALL 911 AFTER THIRD DOSE   olmesartan-hydrochlorothiazide (BENICAR HCT) 20-12.5 MG tablet Take 0.5 tablets by mouth daily. If  BP >130/>80 may increase to 1 pill qam. Stop 40-25 mg dose   ondansetron (ZOFRAN ODT) 4 MG disintegrating tablet Take 1 tablet (4 mg total) by mouth every 8 (eight) hours as needed for nausea or vomiting.   OZEMPIC, 0.25 OR 0.5 MG/DOSE, 2 MG/1.5ML SOPN Inject into the skin.   pantoprazole (PROTONIX) 40 MG tablet TAKE 1 TABLET BY MOUTH  TWICE DAILY BEFORE MEALS   REPATHA SURECLICK 833 MG/ML SOAJ INJECT 140MG  SUBCUTANEOUSLY EVERY 2 WEEKS   traMADol (ULTRAM) 50 MG tablet Take 1 tablet (50 mg total) by mouth every 6 (six) hours as needed for moderate pain.   traZODone (DESYREL) 150 MG tablet TAKE 1 TABLET BY MOUTH AT  BEDTIME AS NEEDED FOR SLEEP   [DISCONTINUED] isosorbide mononitrate (IMDUR) 60 MG 24 hr tablet Take 1 tablet (60 mg total) by mouth in the morning and  at bedtime.     Allergies:   Ativan [lorazepam], Augmentin [amoxicillin-pot clavulanate], Lactose intolerance (gi), Latex, Tape, Xifaxan [rifaximin], and Drixoral allergy sinus [dexbromphen-pse-apap er]   Social History   Socioeconomic History   Marital status: Single    Spouse name: Not on file   Number of children: 1   Years of education: Not on file   Highest education level: Not on file  Occupational History   Not on file  Tobacco Use   Smoking status: Former    Packs/day: 1.00    Years: 30.00    Pack years: 30.00    Types: Cigarettes    Quit date: 12/31/2020    Years since quitting: 0.3   Smokeless tobacco: Never   Tobacco comments:    Quit after being admitted on 6/20 01/17/2021  Vaping Use   Vaping Use: Never used  Substance and Sexual Activity   Alcohol use: Yes    Comment: once a year   Drug use: Yes    Frequency: 7.0 times per week    Types: Marijuana, Cocaine    Comment: 1 per day   Sexual activity: Not on file  Other Topics Concern   Not on file  Social History Narrative   From  [place] now living in [place]    1 son    No guns    Wears seat belt   Safe in relationship    Social Determinants of Health   Financial Resource Strain: Low Risk    Difficulty of Paying Living Expenses: Not very hard  Food Insecurity: No Food Insecurity   Worried About Charity fundraiser in the Last Year: Never true   Arboriculturist in the Last Year: Never true  Transportation Needs: No Transportation Needs   Lack of Transportation (Medical): No   Lack of Transportation (Non-Medical): No  Physical Activity: Not on file  Stress: No Stress Concern Present   Feeling of Stress : Not at all  Social Connections: Unknown   Frequency of Communication with Friends and Family: More than three times a week   Frequency of Social Gatherings with Friends and Family: More than three times a week   Attends Religious Services: Not on Electrical engineer or Organizations: Not on file   Attends Archivist Meetings: Not on file   Marital Status: Not on file     Family History: The patient's family history includes Alcohol abuse in her father; CAD in her brother and mother; Depression in her brother and mother; Diabetes in her brother; Heart disease in her brother, father, and mother; Hyperlipidemia in her brother and mother; Hypertension in her mother; Lupus in an other family member; Sickle cell anemia in an other family member.  ROS:   Please see the history of present illness.     All other systems reviewed and are negative.  EKGs/Labs/Other Studies Reviewed:    The following studies were reviewed today:  Cardiac cath 09/2020 Conclusions: Moderately severe multivessel coronary artery disease including mild plaquing of the proximal and distal LAD, 40% in-stent restenosis of ostial LCx, 30% distal LCx lesion, and focal 60% mid RCA stenosis. Hyperdynamic left ventricular contraction with moderately elevated filling pressure. Significant difficulty attaining  adequate sedation during procedure.  The patient would fluctuate between marked agitation and sleep every few minutes after having received 3 mg of midazolam and 75 mcg of fentanyl.   Recommendations: Continue current antianginal therapy with escalation as  tolerated if recurrent angina occurs. Add furosemide 20 mg daily for gentle diuresis in the setting of elevated LVEDP consistent with diastolic dysfunction.  This may be contributing to the patient's symptoms. If the patient has refractory angina, functional study to assess hemodynamic significance of LCx/RCA disease is recommended.  If catheterization/PCI is needed in the future, involvement of anesthesia will need to be considered.   Nelva Bush, MD [facility]   EKG:  EKG is  ordered today.  The ekg ordered today demonstrates NSR, 71bpm, TWI aVL  Recent Labs: 02/19/2021: TSH 3.052 04/24/2021: ALT 43; BUN 9; Creatinine, Ser 0.66; Hemoglobin 12.7; Magnesium 1.8; Platelets 399; Potassium 3.8; Sodium 138  Recent Lipid Panel    Component Value Date/Time   CHOL 158 12/31/2020 0854   TRIG 275 (H) 12/31/2020 0854   HDL 42 12/31/2020 0854   CHOLHDL 3.8 12/31/2020 0854   CHOLHDL 4.4 08/30/2020 1112   VLDL 33 08/30/2020 1112   LDLCALC 72 12/31/2020 0854   LDLCALC 193 (H) 12/31/2017 0905   LDLDIRECT 148.0 (H) 08/30/2020 1112     Physical Exam:    VS:  BP 128/78 (BP Location: Left Arm, Patient Position: Sitting, Cuff Size: Normal)   Pulse 71   Ht 5\' 3"  (1.6 m)   Wt 175 lb (79.4 kg)   SpO2 98%   BMI 31.00 kg/m     Wt Readings from Last 3 Encounters:  04/30/21 175 lb (79.4 kg)  04/22/21 180 lb 5.4 oz (81.8 kg)  03/11/21 180 lb 6 oz (81.8 kg)     GEN:  Well nourished, well developed in no acute distress HEENT: Normal NECK: No JVD; No carotid bruits LYMPHATICS: No lymphadenopathy CARDIAC: RRR, no murmurs, rubs, gallops RESPIRATORY:  Clear to auscultation without rales, wheezing or rhonchi  ABDOMEN: Soft, non-tender,  non-distended MUSCULOSKELETAL:  No edema; No deformity  SKIN: Warm and dry NEUROLOGIC:  Alert and oriented x 3 PSYCHIATRIC:  Normal affect   ASSESSMENT:    1. Coronary artery disease of native artery of native heart with stable angina pectoris ([place])   2. Essential hypertension   3. Hyperlipidemia, mixed   4. OSA (obstructive sleep apnea)   5. Tobacco use   6. Cocaine use   7. Marijuana use    PLAN:    In order of problems listed above:  Chest pain CAD Patient reports chest pain with typical and atypical features. Sounds somewhat of GERD but it is relieved with NTG. She has h/o moderate CAD by cath in 09/2020 recommended antianginal therapy, but can consider PCI to Lcx and RCA if refractory angina. EKG today with no new ischemic changes. I will increase Imdur to 90 mg daily. Also recommend f/u with GI for GERD, she is already on Protonix. Continue Aspirin, Plavix, statin, zetia, repatha, No BB 2/2 ?cocaine use. We will re-evaluate symptoms at follow-up.   Nausea and vomiting Patient denies further nausea and vomiting. She started eating normally 3 days ago.  HTN BP good today. Increase Imdur as above. Continue amlodipine 10mg  daily, Benicar 20-12/5mg  daily.   HLD LDL 72 12/2020. Continue Repatha, Crestor, and Zetia  Substance use She denies tobacco use. Reports occasional marijuana use. Denies cocaine use, and is unsure how UDS in the hospital came up positive for cocaine.   DM2 A1C 6.2 02/2021. Followed by PCP.   OSA not on CPAP Patient is noncompliant with CPAP.  Disposition: Follow up in 2 month(s) with MD/APP    Signed, Haidee Stogsdill Ninfa Meeker, PA-C  04/30/2021  9:21 AM    [place]

## 2021-04-30 ENCOUNTER — Encounter: Payer: Self-pay | Admitting: Medical

## 2021-04-30 ENCOUNTER — Ambulatory Visit: Payer: Medicare Other | Admitting: Podiatry

## 2021-04-30 ENCOUNTER — Ambulatory Visit (INDEPENDENT_AMBULATORY_CARE_PROVIDER_SITE_OTHER): Payer: Medicare Other | Admitting: Medical

## 2021-04-30 ENCOUNTER — Other Ambulatory Visit: Payer: Self-pay

## 2021-04-30 VITALS — BP 128/78 | HR 71 | Ht 63.0 in | Wt 175.0 lb

## 2021-04-30 DIAGNOSIS — I10 Essential (primary) hypertension: Secondary | ICD-10-CM | POA: Diagnosis not present

## 2021-04-30 DIAGNOSIS — Z720 Tobacco use: Secondary | ICD-10-CM

## 2021-04-30 DIAGNOSIS — I25118 Atherosclerotic heart disease of native coronary artery with other forms of angina pectoris: Secondary | ICD-10-CM

## 2021-04-30 DIAGNOSIS — E782 Mixed hyperlipidemia: Secondary | ICD-10-CM | POA: Diagnosis not present

## 2021-04-30 DIAGNOSIS — G4733 Obstructive sleep apnea (adult) (pediatric): Secondary | ICD-10-CM

## 2021-04-30 DIAGNOSIS — F1490 Cocaine use, unspecified, uncomplicated: Secondary | ICD-10-CM

## 2021-04-30 DIAGNOSIS — F1290 Cannabis use, unspecified, uncomplicated: Secondary | ICD-10-CM

## 2021-04-30 MED ORDER — ISOSORBIDE MONONITRATE ER 30 MG PO TB24: ORAL_TABLET | ORAL | 6 refills | Status: DC

## 2021-04-30 NOTE — Patient Instructions (Addendum)
Medication Instructions:  - Your physician has recommended you make the following change in your medication:   1) INCREASE imdur (isosorbide MN) 30 mg- take 3 tablets (90 mg) by mouth ONCE daily  *If you need a refill on your cardiac medications before your next appointment, please call your pharmacy*   Lab Work: - none ordered  If you have labs (blood work) drawn today and your tests are completely normal, you will receive your results only by: [place] (if you have MyChart) OR A paper copy in the mail If you have any lab test that is abnormal or we need to change your treatment, we will call you to review the results.   Testing/Procedures: - none ordered   Follow-Up: At [facility], you and your health needs are our priority.  As part of our continuing mission to provide you with exceptional heart care, we have created designated Provider Care Teams.  These Care Teams include your primary Cardiologist (physician) and Advanced Practice Providers (APPs -  Physician Assistants and Nurse Practitioners) who all work together to provide you with the care you need, when you need it.  We recommend signing up for the patient portal called "MyChart".  Sign up information is provided on this After Visit Summary.  MyChart is used to connect with patients for Virtual Visits (Telemedicine).  Patients are able to view lab/test results, encounter notes, upcoming appointments, etc.  Non-urgent messages can be sent to your provider as well.   To learn more about what you can do with MyChart, go to [email or web address].    Your next appointment:   2 month(s)  The format for your next appointment:   In Person  Provider:   You may see Nelva Bush, MD or one of the following Advanced Practice Providers on your designated Care Team:    Cadence Kathlen Mody, [place]   Other Instructions N/a

## 2021-05-01 ENCOUNTER — Other Ambulatory Visit: Payer: Self-pay | Admitting: Internal Medicine

## 2021-05-01 DIAGNOSIS — I25118 Atherosclerotic heart disease of native coronary artery with other forms of angina pectoris: Secondary | ICD-10-CM

## 2021-05-01 NOTE — Telephone Encounter (Signed)
Transition Care Management Unsuccessful Follow-up Telephone Call  Date of discharge and from where:  04/24/21-[facility]  Attempts:  3rd Attempt  Reason for unsuccessful TCM follow-up call:  Left voice message

## 2021-05-02 ENCOUNTER — Ambulatory Visit: Payer: Medicare Other | Admitting: Podiatry

## 2021-05-02 ENCOUNTER — Telehealth: Payer: Self-pay | Admitting: Medical

## 2021-05-02 NOTE — Telephone Encounter (Signed)
The patient was seen in clinic on 04/30/21 with Shawna Anderson, [place]. Orders received from Cadence, PA at the time of the visit to have the patient increase Imdur to 90 mg once daily.  This was discussed with the patient who voiced understanding with the medication change.  Post visit, Shawna advised that the patient's medication list actually stated she was taking Imdur 60 mg- 1 tablet BID (instead of QD). She attempted to call the patient to clarify how she was taking her Imdur and discuss possible stress testing. She was unsuccessful at reaching the patient.  I attempted to reach out again to her today to clarify her Imdur dose at that time of her visit.  No answer- I left a message to please call back.

## 2021-05-06 ENCOUNTER — Telehealth: Payer: Self-pay | Admitting: Internal Medicine

## 2021-05-06 NOTE — Telephone Encounter (Signed)
I spoke with the patient. I advised I was calling to inquire/ clarify how she was taking her imdur prior to her appointment with Cadence on 04/30/21.  Her intake medication list on 04/30/21 stated she was taking imdur 60 mg BID. Cadence advised at that visit to increase imdur to 90 mg once daily not realizing the dose already stated "BID."  The patient advised she was not home at the moment, but would call back once her medications were in front of her to clarify.

## 2021-05-06 NOTE — Telephone Encounter (Signed)
Patient calling to confirm previous to ov with furth she was taking Imdur 30 mg po q d

## 2021-05-06 NOTE — Telephone Encounter (Signed)
Attempted to return call, unable to make contact with pt LMTCB   IMDUR 90 mg by mouth ONCE daily Take 3 tabs daily of the 30 mg tabs

## 2021-05-07 NOTE — Telephone Encounter (Signed)
Error

## 2021-05-10 NOTE — Telephone Encounter (Signed)
Called and spoke to pt.  She does again confirm that she was taking imdur 30 mg daily.  Notified pt that Cadence wanted her to incr this to Imdur 90 mg daily.  Pt understands to take 3 tablets of Imdur 30 mg for total of 90 mg daily.  Pt aware Rx was sent to [facility] on [address] as well.  Pt voiced understanding and will pick up new Rx.  Pt would like further refills to be sent to [facility].  [facility] has now been made her preferred pharmacy in chart.  Pt has no further questions at this time.

## 2021-05-14 ENCOUNTER — Ambulatory Visit: Payer: Medicare Other | Admitting: Podiatry

## 2021-05-15 ENCOUNTER — Other Ambulatory Visit: Payer: Self-pay | Admitting: Internal Medicine

## 2021-05-15 DIAGNOSIS — I25118 Atherosclerotic heart disease of native coronary artery with other forms of angina pectoris: Secondary | ICD-10-CM

## 2021-05-16 ENCOUNTER — Ambulatory Visit: Payer: Medicare Other | Admitting: Podiatry

## 2021-05-21 ENCOUNTER — Other Ambulatory Visit: Payer: Self-pay

## 2021-05-21 ENCOUNTER — Ambulatory Visit (INDEPENDENT_AMBULATORY_CARE_PROVIDER_SITE_OTHER): Payer: Medicare Other | Admitting: Podiatry

## 2021-05-21 ENCOUNTER — Encounter: Payer: Self-pay | Admitting: Podiatry

## 2021-05-21 DIAGNOSIS — M722 Plantar fascial fibromatosis: Secondary | ICD-10-CM | POA: Diagnosis not present

## 2021-05-21 NOTE — Progress Notes (Signed)
Subjective:  Patient ID: Shawna Anderson, female    DOB: 1965/09/01,  MRN: [id number]  Chief Complaint  Patient presents with   Plantar Fasciitis    Bilateral plantar fasciitis  PT stated that the injections helped a lot     55 y.o. female presents with the above complaint.  Patient presents for follow-up of bilateral Plan[place] fasciitis.  Patient states that she had that hip surgery done in [place].  She states that the pain came back again.  She was lost to follow-up.  She wanted to rediscuss her treatment options.  She denies any other acute complaints.  Review of Systems: Negative except as noted in the HPI. Denies N/V/F/Ch.  Past Medical History:  Diagnosis Date   Anxiety    Asthma    Bipolar 1 disorder ([place])    Bipolar disorder ([place])    CAD (coronary artery disease)    s/p stent BMS OM Cx   Cervical herniated disc 04/12/2016   COPD (chronic obstructive pulmonary disease) (HCC)    Depression    Diabetes mellitus without complication (HCC)    Diverticulitis    GERD (gastroesophageal reflux disease)    Glaucoma    History of blood transfusion    Hyperlipidemia    Hypertension    OSA (obstructive sleep apnea)    not using cpap    Plantar fasciitis    b/l feet s/p steroid shots w/o help and left surgery w/o help    UTI (urinary tract infection)     Current Outpatient Medications:    albuterol (PROAIR HFA) 108 (90 Base) MCG/ACT inhaler, Inhale 1-2 puffs into the lungs every 4 (four) hours as needed for wheezing or shortness of breath., Disp: 54 g, Rfl: 3   amLODipine (NORVASC) 10 MG tablet, TAKE 1 TABLET BY MOUTH AT  BEDTIME, Disp: 90 tablet, Rfl: 3   aspirin 81 MG chewable tablet, Chew 81 mg by mouth daily., Disp: , Rfl:    atorvastatin (LIPITOR) 80 MG tablet, TAKE 1 TABLET BY MOUTH  DAILY AT 6 PM., Disp: 90 tablet, Rfl: 3   Budeson-Glycopyrrol-Formoterol (BREZTRI AEROSPHERE) 160-9-4.8 MCG/ACT AERO, Inhale 2 puffs into the lungs in the morning and at bedtime. Rinse,  Disp: 32.1 g, Rfl: 3   clopidogrel (PLAVIX) 75 MG tablet, TAKE 1 TABLET BY MOUTH  DAILY, Disp: 90 tablet, Rfl: 3   divalproex (DEPAKOTE) 500 MG DR tablet, TAKE 1 TABLET BY MOUTH  TWICE DAILY, Disp: 180 tablet, Rfl: 3   ezetimibe (ZETIA) 10 MG tablet, TAKE 1 TABLET BY MOUTH  DAILY WITH LIPITOR 80 MG AT NIGHT, Disp: 90 tablet, Rfl: 3   isosorbide mononitrate (IMDUR) 30 MG 24 hr tablet, Take 3 tablets (90 mg) by mouth once daily, Disp: 90 tablet, Rfl: 6   methocarbamol (ROBAXIN) 500 MG tablet, Take 1 tablet (500 mg total) by mouth 2 (two) times daily as needed for muscle spasms., Disp: 180 tablet, Rfl: 1   metoCLOPramide (REGLAN) 5 MG tablet, Take 1 tablet (5 mg total) by mouth 4 (four) times daily -  before meals and at bedtime., Disp: 120 tablet, Rfl: 0   nitroGLYCERIN (NITROSTAT) 0.4 MG SL tablet, DISSOLVE 1 TABLET UNDER  TONGUE EVERY 5 MINUTES AS  NEEDED FOR CHEST PAIN MAX  OF 3 TABLETS IN 15 MINUTES  . CALL 911 AFTER THIRD DOSE, Disp: 75 tablet, Rfl: 4   olmesartan-hydrochlorothiazide (BENICAR HCT) 20-12.5 MG tablet, Take 0.5 tablets by mouth daily. If  BP >130/>80 may increase to 1 pill qam.  Stop 40-25 mg dose, Disp: 90 tablet, Rfl: 3   ondansetron (ZOFRAN ODT) 4 MG disintegrating tablet, Take 1 tablet (4 mg total) by mouth every 8 (eight) hours as needed for nausea or vomiting., Disp: 20 tablet, Rfl: 0   OZEMPIC, 0.25 OR 0.5 MG/DOSE, 2 MG/1.5ML SOPN, Inject into the skin., Disp: , Rfl:    pantoprazole (PROTONIX) 40 MG tablet, TAKE 1 TABLET BY MOUTH  TWICE DAILY BEFORE MEALS, Disp: 180 tablet, Rfl: 3   REPATHA SURECLICK 902 MG/ML SOAJ, INJECT 140MG  SUBCUTANEOUSLY EVERY 2 WEEKS, Disp: 6 mL, Rfl: 0   traMADol (ULTRAM) 50 MG tablet, Take 1 tablet (50 mg total) by mouth every 6 (six) hours as needed for moderate pain., Disp: 20 tablet, Rfl: 0   traZODone (DESYREL) 150 MG tablet, TAKE 1 TABLET BY MOUTH AT  BEDTIME AS NEEDED FOR SLEEP, Disp: 90 tablet, Rfl: 3 No current facility-administered medications  for this visit.  Facility-Administered Medications Ordered in Other Visits:    albuterol (PROVENTIL) (2.5 MG/3ML) 0.083% nebulizer solution 2.5 mg, 2.5 mg, Nebulization, Once, Tyler Pita, MD  Social History   Tobacco Use  Smoking Status Former   Packs/day: 1.00   Years: 30.00   Pack years: 30.00   Types: Cigarettes   Quit date: 12/31/2020   Years since quitting: 0.3  Smokeless Tobacco Never  Tobacco Comments   Quit after being admitted on 6/20 01/17/2021    Allergies  Allergen Reactions   Ativan [Lorazepam]     Pt states it makes her tongue do weird things    Augmentin [Amoxicillin-Pot Clavulanate]     Upset stomach    Lactose Intolerance (Gi)    Latex Other (See Comments)    Patient stated that she was told by her doctor that she is "allergic to" this   Tape Other (See Comments)    Patient stated that she was told by her doctor that she is "allergic to" this   Xifaxan [Rifaximin]     Pt believes this is contributing to her abdominal pain/discomfort   Drixoral Allergy Sinus [Dexbromphen-Pse-Apap Er] Rash   Objective:  There were no vitals filed for this visit. There is no height or weight on file to calculate BMI. Constitutional Well developed. Well nourished.  Vascular Dorsalis pedis pulses palpable bilaterally. Posterior tibial pulses palpable bilaterally. Capillary refill normal to all digits.  No cyanosis or clubbing noted. Pedal hair growth normal.  Neurologic Normal speech. Oriented to person, place, and time. Epicritic sensation to light touch grossly present bilaterally.  Dermatologic Nails well groomed and normal in appearance. No open wounds. No skin lesions.  Orthopedic: Normal joint ROM without pain or crepitus bilaterally. No visible deformities. Tender to palpation at the calcaneal tuber bilaterally. No pain with calcaneal squeeze bilaterally. Ankle ROM diminished range of motion bilaterally. Silfverskiold Test: positive bilaterally.    Radiographs: Taken and reviewed. No acute fractures or dislocations. No evidence of stress fracture.  Plantar heel spur present. Posterior heel spur present.   Assessment:   1. Plantar fasciitis of right foot   2. Plantar fasciitis of left foot     Plan:  Patient was evaluated and treated and all questions answered.  Plantar Fasciitis, bilaterally - XR reviewed as above.  - Educated on icing and stretching. Instructions given.  - SecondInjection delivered to the plantar fascia as below. - DME: Plantar Fascial Brace - Pharmacologic management: None -I read discussed shoe gear modification orthotics.  Procedure: Injection Tendon/Ligament Location: Bilateral plantar fascia at the glabrous  junction; medial approach. Skin Prep: alcohol Injectate: 0.5 cc 0.5% marcaine plain, 0.5 cc of 1% Lidocaine, 0.5 cc kenalog 10. Disposition: Patient tolerated procedure well. Injection site dressed with a band-aid.  No follow-ups on file.

## 2021-05-29 ENCOUNTER — Ambulatory Visit: Payer: Medicare Other | Admitting: Gastroenterology

## 2021-05-31 ENCOUNTER — Other Ambulatory Visit: Payer: Self-pay

## 2021-06-09 ENCOUNTER — Other Ambulatory Visit: Payer: Self-pay | Admitting: Internal Medicine

## 2021-06-09 ENCOUNTER — Other Ambulatory Visit: Payer: Self-pay | Admitting: Gastroenterology

## 2021-06-09 DIAGNOSIS — I10 Essential (primary) hypertension: Secondary | ICD-10-CM

## 2021-06-09 DIAGNOSIS — K3184 Gastroparesis: Secondary | ICD-10-CM

## 2021-06-09 DIAGNOSIS — K219 Gastro-esophageal reflux disease without esophagitis: Secondary | ICD-10-CM

## 2021-06-10 ENCOUNTER — Other Ambulatory Visit: Payer: Self-pay | Admitting: Internal Medicine

## 2021-06-10 ENCOUNTER — Other Ambulatory Visit: Payer: Self-pay

## 2021-06-10 DIAGNOSIS — I10 Essential (primary) hypertension: Secondary | ICD-10-CM

## 2021-06-10 DIAGNOSIS — K3184 Gastroparesis: Secondary | ICD-10-CM

## 2021-06-10 DIAGNOSIS — J4520 Mild intermittent asthma, uncomplicated: Secondary | ICD-10-CM

## 2021-06-10 DIAGNOSIS — J449 Chronic obstructive pulmonary disease, unspecified: Secondary | ICD-10-CM

## 2021-06-10 DIAGNOSIS — E1159 Type 2 diabetes mellitus with other circulatory complications: Secondary | ICD-10-CM

## 2021-06-10 MED ORDER — OLMESARTAN MEDOXOMIL-HCTZ 20-12.5 MG PO TABS: 0.5000 | ORAL_TABLET | Freq: Every day | ORAL | 3 refills | Status: DC

## 2021-06-10 MED ORDER — METOCLOPRAMIDE HCL 5 MG PO TABS: 5.0000 mg | ORAL_TABLET | Freq: Three times a day (TID) | ORAL | 0 refills | Status: DC

## 2021-06-10 NOTE — Telephone Encounter (Signed)
Please advise to refill Olmesartan Medoxomil -HCTZ  was DC at discharge 6/22 and okay to refill zofran?

## 2021-06-10 NOTE — Progress Notes (Signed)
Sent in refill as requested 

## 2021-06-12 ENCOUNTER — Encounter: Payer: Self-pay | Admitting: Internal Medicine

## 2021-06-12 ENCOUNTER — Ambulatory Visit (INDEPENDENT_AMBULATORY_CARE_PROVIDER_SITE_OTHER): Payer: Medicare Other | Admitting: Internal Medicine

## 2021-06-12 ENCOUNTER — Other Ambulatory Visit: Payer: Self-pay

## 2021-06-12 VITALS — Temp 97.6°F | Ht 63.0 in | Wt 166.8 lb

## 2021-06-12 DIAGNOSIS — R634 Abnormal weight loss: Secondary | ICD-10-CM

## 2021-06-12 DIAGNOSIS — R7989 Other specified abnormal findings of blood chemistry: Secondary | ICD-10-CM

## 2021-06-12 DIAGNOSIS — Z23 Encounter for immunization: Secondary | ICD-10-CM | POA: Diagnosis not present

## 2021-06-12 DIAGNOSIS — R110 Nausea: Secondary | ICD-10-CM

## 2021-06-12 DIAGNOSIS — R42 Dizziness and giddiness: Secondary | ICD-10-CM

## 2021-06-12 DIAGNOSIS — Z1283 Encounter for screening for malignant neoplasm of skin: Secondary | ICD-10-CM

## 2021-06-12 DIAGNOSIS — L989 Disorder of the skin and subcutaneous tissue, unspecified: Secondary | ICD-10-CM | POA: Diagnosis not present

## 2021-06-12 MED ORDER — PROMETHAZINE HCL 25 MG PO TABS
25.0000 mg | ORAL_TABLET | Freq: Two times a day (BID) | ORAL | 0 refills | Status: DC | PRN
Start: 2021-06-12 — End: 2021-11-21

## 2021-06-12 MED ORDER — PROMETHAZINE HCL 25 MG PO TABS
25.0000 mg | ORAL_TABLET | Freq: Two times a day (BID) | ORAL | 0 refills | Status: DC | PRN
Start: 2021-06-12 — End: 2021-08-21

## 2021-06-12 NOTE — Progress Notes (Signed)
Chief Complaint  Patient presents with   Follow-up   Emesis    With nausea ongoing for months. Every morning and Patient can not keep down medication    Dizziness   F/u  1. Chronic nausea on ozempic 0.5 weekly rec stop she even stopped thc but still having nausea and belching and vomiting for months and lost around 20 lbs she is c/w cancer and multiple family members have cancer  Taking zofran 4 mg prn and protonix 40 mg bid per GI and GI just added Reglan as gastric emptying study prev showed mild delayed emptying but not started reglan yet   2. C/o dizziness and bp at times low <90 sbp lying 110/70 HR 82, sitting 108/76 hr 79, standing 118/80 hr 87 and felt dizzy and lightheaded sitting/standing  On benicar 20-12.5 1/2 tablet and norvasc 10 mg qd  3. Dark skin lesion right lower back will refer to dermatology   Review of Systems  Constitutional:  Positive for malaise/fatigue and weight loss.  HENT:  Negative for hearing loss.   Eyes:  Negative for blurred vision.  Respiratory:  Negative for shortness of breath.   Cardiovascular:  Negative for chest pain.  Gastrointestinal:  Positive for nausea and vomiting. Negative for abdominal pain and blood in stool.  Genitourinary:  Negative for dysuria.  Musculoskeletal:  Negative for falls and joint pain.  Skin:  Negative for rash.  Neurological:  Positive for dizziness. Negative for headaches.  Psychiatric/Behavioral:  Negative for depression.   Past Medical History:  Diagnosis Date   Anxiety    Asthma    Bipolar 1 disorder ([place])    Bipolar disorder ([place])    CAD (coronary artery disease)    s/p stent BMS OM Cx   Cervical herniated disc 04/12/2016   COPD (chronic obstructive pulmonary disease) (HCC)    Depression    Diabetes mellitus without complication (HCC)    Diverticulitis    GERD (gastroesophageal reflux disease)    Glaucoma    History of blood transfusion    Hyperlipidemia    Hypertension    OSA (obstructive sleep apnea)     not using cpap    Plantar fasciitis    b/l feet s/p steroid shots w/o help and left surgery w/o help    UTI (urinary tract infection)    Past Surgical History:  Procedure Laterality Date   ABDOMINAL HYSTERECTOMY     ABDOMINAL SURGERY  1995   Bowel resection.   CARDIAC CATHETERIZATION N/A 04/14/2016   Procedure: Left Heart Cath and Coronary Angiography;  Surgeon: Burnell Blanks, MD;  Location: [facility];  Service: Cardiovascular;  Laterality: N/A;   CARDIAC SURGERY     COLONOSCOPY WITH PROPOFOL N/A 07/11/2019   Procedure: COLONOSCOPY WITH PROPOFOL;  Surgeon: Jonathon Bellows, MD;  Location: [facility] ENDOSCOPY;  Service: Gastroenterology;  Laterality: N/A;   COLONOSCOPY WITH PROPOFOL N/A 07/29/2019   Procedure: COLONOSCOPY WITH PROPOFOL;  Surgeon: Jonathon Bellows, MD;  Location[facility] END[facility]COPY;  Service: Gastroenterology;  Laterality: N/A;   CORONARY ANGIOPLASTY WITH STENT PLACEMENT  2010   Drug eluting stent   ESOPHAGOGASTRODUODENOSCOPY (EGD) WITH PROPOFOL N/A 07/11/2019   Procedure: ESOPHAGOGASTRODUODENOSCOPY (EGD) WITH PROPOFOL;  Surgeon: Jonathon Bellows, MD;  Location: [facility] ENDOSCOPY;  Service: Gastroenterology;  Laterality: N/A;   LEFT HEART CATH AND CORONARY ANGIOGRAPHY Left 09/11/2020   Procedure: LEFT HEART CATH AND CORONARY ANGIOGRAPHY;  Surgeon: Nelva Bush, MD;  Location: [facility];  Service: Cardiovascular;  Laterality: Left;  OVARIAN CYST REMOVAL     Family History  Problem Relation Age of Onset   CAD Mother    Depression Mother    Heart disease Mother    Hyperlipidemia Mother    Hypertension Mother    Heart disease Father    Alcohol abuse Father    Cancer Brother        brain   CAD Brother    Depression Brother    Diabetes Brother    Heart disease Brother    Hyperlipidemia Brother    Lupus Other    Sickle cell anemia Other    Social History   Socioeconomic History   Marital status: Single    Spouse name: Not on file   Number of children: 1    Years of education: Not on file   Highest education level: Not on file  Occupational History   Not on file  Tobacco Use   Smoking status: Former    Packs/day: 1.00    Years: 30.00    Pack years: 30.00    Types: Cigarettes    Quit date: 12/31/2020    Years since quitting: 0.4   Smokeless tobacco: Never   Tobacco comments:    Quit after being admitted on 6/20 01/17/2021  Vaping Use   Vaping Use: Never used  Substance and Sexual Activity   Alcohol use: Yes    Comment: once a year   Drug use: Yes    Frequency: 7.0 times per week    Types: Marijuana, Cocaine    Comment: 1 per day   Sexual activity: Not on file  Other Topics Concern   Not on file  Social History Narrative   From [place] now living in [place]    1 son    No guns    Wears seat belt   Safe in relationship    Social Determinants of Health   Financial Resource Strain: Low Risk    Difficulty of Paying Living Expenses: Not very hard  Food Insecurity: No Food Insecurity   Worried About Charity fundraiser in the Last Year: Never true   Arboriculturist in the Last Year: Never true  Transportation Needs: No Transportation Needs   Lack of Transportation (Medical): No   Lack of Transportation (Non-Medical): No  Physical Activity: Not on file  Stress: No Stress Concern Present   Feeling of Stress : Not at all  Social Connections: Unknown   Frequency of Communication with Friends and Family: More than three times a week   Frequency of Social Gatherings with Friends and Family: More than three times a week   Attends Religious Services: Not on Electrical engineer or Organizations: Not on file   Attends Archivist Meetings: Not on file   Marital Status: Not on file  Intimate Partner Violence: Not At Risk   Fear of Current or Ex-Partner: No   Emotionally Abused: No   Physically Abused: No   Sexually Abused: No   Current Meds  Medication Sig   albuterol (PROAIR HFA) 108 (90 Base)  MCG/ACT inhaler Inhale 1-2 puffs into the lungs every 4 (four) hours as needed for wheezing or shortness of breath.   amLODipine (NORVASC) 10 MG tablet TAKE 1 TABLET BY MOUTH AT  BEDTIME   aspirin 81 MG chewable tablet Chew 81 mg by mouth daily.   atorvastatin (LIPITOR) 80 MG tablet TAKE 1 TABLET BY MOUTH  DAILY AT 6 PM.  clopidogrel (PLAVIX) 75 MG tablet TAKE 1 TABLET BY MOUTH  DAILY   divalproex (DEPAKOTE) 500 MG DR tablet TAKE 1 TABLET BY MOUTH  TWICE DAILY   ezetimibe (ZETIA) 10 MG tablet TAKE 1 TABLET BY MOUTH  DAILY WITH LIPITOR 80 MG AT NIGHT   isosorbide mononitrate (IMDUR) 30 MG 24 hr tablet Take 3 tablets (90 mg) by mouth once daily   methocarbamol (ROBAXIN) 500 MG tablet Take 1 tablet (500 mg total) by mouth 2 (two) times daily as needed for muscle spasms.   metoCLOPramide (REGLAN) 5 MG tablet TAKE 1 TABLET BY MOUTH 4  TIMES DAILY BEFORE MEALS  AND AT BEDTIME   metoCLOPramide (REGLAN) 5 MG tablet Take 1 tablet (5 mg total) by mouth 4 (four) times daily -  before meals and at bedtime.   nitroGLYCERIN (NITROSTAT) 0.4 MG SL tablet DISSOLVE 1 TABLET UNDER  TONGUE EVERY 5 MINUTES AS  NEEDED FOR CHEST PAIN MAX  OF 3 TABLETS IN 15 MINUTES  . CALL 911 AFTER THIRD DOSE   ondansetron (ZOFRAN-ODT) 4 MG disintegrating tablet DISSOLVE 1 TABLET ON THE  TONGUE EVERY 8 HOURS AS  NEEDED FOR NAUEA OR  VOMITING   pantoprazole (PROTONIX) 40 MG tablet TAKE 1 TABLET BY MOUTH  TWICE DAILY BEFORE MEALS   promethazine (PHENERGAN) 25 MG tablet Take 1 tablet (25 mg total) by mouth 2 (two) times daily as needed for nausea or vomiting.   promethazine (PHENERGAN) 25 MG tablet Take 1 tablet (25 mg total) by mouth 2 (two) times daily as needed for nausea or vomiting.   REPATHA SURECLICK 962 MG/ML SOAJ INJECT 140MG  SUBCUTANEOUSLY EVERY 2 WEEKS   traMADol (ULTRAM) 50 MG tablet Take 1 tablet (50 mg total) by mouth every 6 (six) hours as needed for moderate pain.   traZODone (DESYREL) 150 MG tablet TAKE 1 TABLET BY MOUTH  AT  BEDTIME AS NEEDED FOR SLEEP   [DISCONTINUED] Budeson-Glycopyrrol-Formoterol (BREZTRI AEROSPHERE) 160-9-4.8 MCG/ACT AERO Inhale 2 puffs into the lungs in the morning and at bedtime. Rinse   [DISCONTINUED] olmesartan-hydrochlorothiazide (BENICAR HCT) 20-12.5 MG tablet Take 0.5 tablets by mouth daily. If  BP >130/>80 may increase to 1 pill qam. Stop 40-25 mg dose   [DISCONTINUED] OZEMPIC, 0.25 OR 0.5 MG/DOSE, 2 MG/1.5ML SOPN Inject into the skin.   Allergies  Allergen Reactions   Ativan [Lorazepam]     Pt states it makes her tongue do weird things    Augmentin [Amoxicillin-Pot Clavulanate]     Upset stomach    Lactose Intolerance (Gi)    Latex Other (See Comments)    Patient stated that she was told by her doctor that she is "allergic to" this   Tape Other (See Comments)    Patient stated that she was told by her doctor that she is "allergic to" this   Xifaxan [Rifaximin]     Pt believes this is contributing to her abdominal pain/discomfort   Drixoral Allergy Sinus [Dexbromphen-Pse-Apap Er] Rash   Recent Results (from the past 2160 hour(s))  Lipase, blood     Status: None   Collection Time: 04/22/21 10:47 AM  Result Value Ref Range   Lipase 43 11 - 51 U/L    Comment: Performed at [facility], [address] [place], [place] [address]  Comprehensive metabolic panel     Status: Abnormal   Collection Time: 04/22/21 10:47 AM  Result Value Ref Range   Sodium 134 (L) 135 - 145 mmol/L   Potassium 3.0 (L) 3.5 -  5.1 mmol/L   Chloride 89 (L) 98 - 111 mmol/L   CO2 34 (H) 22 - 32 mmol/L   Glucose, Bld 141 (H) 70 - 99 mg/dL    Comment: Glucose reference range applies only to samples taken after fasting for at least 8 hours.   BUN 20 6 - 20 mg/dL   Creatinine, Ser 0.83 0.44 - 1.00 mg/dL   Calcium 9.3 8.9 - 10.3 mg/dL   Total Protein 7.1 6.5 - 8.1 g/dL   Albumin 4.1 3.5 - 5.0 g/dL   AST 27 15 - 41 U/L   ALT 81 (H) 0 - 44 U/L   Alkaline Phosphatase 86 38 - 126 U/L   Total  Bilirubin 0.7 0.3 - 1.2 mg/dL   GFR, Estimated >60 >60 mL/min    Comment: (NOTE) Calculated using the CKD-EPI Creatinine Equation (2021)    Anion gap 11 5 - 15    Comment: Performed at [facility], [address] [place], [place] [address]  CBC     Status: Abnormal   Collection Time: 04/22/21 10:47 AM  Result Value Ref Range   WBC 12.7 (H) 4.0 - 10.5 K/uL   RBC 4.71 3.87 - 5.11 MIL/uL   Hemoglobin 14.5 12.0 - 15.0 g/dL   HCT 40.7 36.0 - 46.0 %   MCV 86.4 80.0 - 100.0 fL   MCH 30.8 26.0 - 34.0 pg   MCHC 35.6 30.0 - 36.0 g/dL   RDW 13.0 11.5 - 15.5 %   Platelets 485 (H) 150 - 400 K/uL   nRBC 0.0 0.0 - 0.2 %    Comment: Performed at [facility], [address] [place], [place] [address]  Magnesium     Status: Abnormal   Collection Time: 04/22/21 10:47 AM  Result Value Ref Range   Magnesium 2.7 (H) 1.7 - 2.4 mg/dL    Comment: Performed at [facility], [address] [place], [place] [address]  Urinalysis, Complete w Microscopic     Status: Abnormal   Collection Time: 04/22/21 11:17 AM  Result Value Ref Range   Color, Urine YELLOW (A) YELLOW   APPearance CLEAR (A) CLEAR   Specific Gravity, Urine 1.009 1.005 - 1.030   pH 8.0 5.0 - 8.0   Glucose, UA 50 (A) NEGATIVE mg/dL   Hgb urine dipstick SMALL (A) NEGATIVE   Bilirubin Urine NEGATIVE NEGATIVE   Ketones, ur NEGATIVE NEGATIVE mg/dL   Protein, ur NEGATIVE NEGATIVE mg/dL   Nitrite NEGATIVE NEGATIVE   Leukocytes,Ua NEGATIVE NEGATIVE   RBC / HPF 0-5 0 - 5 RBC/hpf   WBC, UA 0-5 0 - 5 WBC/hpf   Bacteria, UA NONE SEEN NONE SEEN   Squamous Epithelial / LPF 0-5 0 - 5    Comment: Performed at [facility], [address] [place], [place] [address]  Resp Panel by RT-PCR (Flu A&B, Covid) Nasopharyngeal Swab     Status: None   Collection Time: 04/22/21 11:17 AM   Specimen: Nasopharyngeal Swab; Nasopharyngeal(NP) swabs in vial transport medium  Result Value Ref Range   SARS Coronavirus 2 by  RT PCR NEGATIVE NEGATIVE    Comment: (NOTE) SARS-CoV-2 target nucleic acids are NOT DETECTED.  The SARS-CoV-2 RNA is generally detectable in upper respiratory specimens during the acute phase of infection. The lowest concentration of SARS-CoV-2 viral copies this assay can detect is 138 copies/mL. A negative result does not preclude SARS-Cov-2 infection and should not be used as the sole basis for treatment or other patient  management decisions. A negative result may occur with  improper specimen collection/handling, submission of specimen other than nasopharyngeal swab, presence of viral mutation(s) within the areas targeted by this assay, and inadequate number of viral copies(<138 copies/mL). A negative result must be combined with clinical observations, patient history, and epidemiological information. The expected result is Negative.  Fact Sheet for Patients:  EntrepreneurPulse.com.au  Fact Sheet for Healthcare Providers:  IncredibleEmployment.be  This test is no t yet approved or cleared by the [place] FDA and  has been authorized for detection and/or diagnosis of SARS-CoV-2 by FDA under an Emergency Use Authorization (EUA). This EUA will remain  in effect (meaning this test can be used) for the duration of the COVID-19 declaration under Section 564(b)(1) of the Act, 21 U.S.C.section 360bbb-3(b)(1), unless the authorization is terminated  or revoked sooner.       Influenza A by PCR NEGATIVE NEGATIVE   Influenza B by PCR NEGATIVE NEGATIVE    Comment: (NOTE) The Xpert Xpress SARS-CoV-2/FLU/RSV plus assay is intended as an aid in the diagnosis of influenza from Nasopharyngeal swab specimens and should not be used as a sole basis for treatment. Nasal washings and aspirates are unacceptable for Xpert Xpress SARS-CoV-2/FLU/RSV testing.  Fact Sheet for Patients: EntrepreneurPulse.com.au  Fact Sheet for Healthcare  Providers: IncredibleEmployment.be  This test is not yet approved or cleared by the [place] FDA and has been authorized for detection and/or diagnosis of SARS-CoV-2 by FDA under an Emergency Use Authorization (EUA). This EUA will remain in effect (meaning this test can be used) for the duration of the COVID-19 declaration under Section 564(b)(1) of the Act, 21 U.S.C. section 360bbb-3(b)(1), unless the authorization is terminated or revoked.  Performed at [facility], [address], [place], [place] [address]   Urine Drug Screen, [facility] only)     Status: Abnormal   Collection Time: 04/22/21 11:17 AM  Result Value Ref Range   Tricyclic, Ur Screen NONE DETECTED NONE DETECTED   Amphetamines, Ur Screen NONE DETECTED NONE DETECTED   MDMA (Ecstasy)Ur Screen NONE DETECTED NONE DETECTED   Cocaine Metabolite,Ur [place] POSITIVE (A) NONE DETECTED   Opiate, Ur Screen POSITIVE (A) NONE DETECTED   Phencyclidine (PCP) Ur S NONE DETECTED NONE DETECTED   Cannabinoid 50 Ng, Ur [place] POSITIVE (A) NONE DETECTED   Barbiturates, Ur Screen NONE DETECTED NONE DETECTED   Benzodiazepine, Ur Scrn NONE DETECTED NONE DETECTED   Methadone Scn, Ur NONE DETECTED NONE DETECTED    Comment: (NOTE) Tricyclics + metabolites, urine    Cutoff 1000 ng/mL Amphetamines + metabolites, urine  Cutoff 1000 ng/mL MDMA (Ecstasy), urine              Cutoff 500 ng/mL Cocaine Metabolite, urine          Cutoff 300 ng/mL Opiate + metabolites, urine        Cutoff 300 ng/mL Phencyclidine (PCP), urine         Cutoff 25 ng/mL Cannabinoid, urine                 Cutoff 50 ng/mL Barbiturates + metabolites, urine  Cutoff 200 ng/mL Benzodiazepine, urine              Cutoff 200 ng/mL Methadone, urine                   Cutoff 300 ng/mL  The urine drug screen provides only a preliminary, unconfirmed analytical test result and should not be used for non-medical purposes.  Clinical consideration and  professional judgment should be applied to any positive drug screen result due to possible interfering substances. A more specific alternate chemical method must be used in order to obtain a confirmed analytical result. Gas chromatography / mass spectrometry (GC/MS) is the preferred confirm atory method. Performed at [facility], [address] [place], [place] [address]   Pregnancy, urine     Status: None   Collection Time: 04/22/21 11:17 AM  Result Value Ref Range   Preg Test, Ur NEGATIVE NEGATIVE    Comment: Performed at [facility], [address] [place], [place] [address]  Glucose, capillary     Status: None   Collection Time: 04/22/21  5:24 PM  Result Value Ref Range   Glucose-Capillary 87 70 - 99 mg/dL    Comment: Glucose reference range applies only to samples taken after fasting for at least 8 hours.   Comment 1 Notify RN    Comment 2 Document in Chart   Glucose, capillary     Status: Abnormal   Collection Time: 04/22/21  9:20 PM  Result Value Ref Range   Glucose-Capillary 189 (H) 70 - 99 mg/dL    Comment: Glucose reference range applies only to samples taken after fasting for at least 8 hours.  Basic metabolic panel     Status: Abnormal   Collection Time: 04/23/21  4:32 AM  Result Value Ref Range   Sodium 138 135 - 145 mmol/L   Potassium 3.6 3.5 - 5.1 mmol/L   Chloride 100 98 - 111 mmol/L   CO2 31 22 - 32 mmol/L   Glucose, Bld 119 (H) 70 - 99 mg/dL    Comment: Glucose reference range applies only to samples taken after fasting for at least 8 hours.   BUN 9 6 - 20 mg/dL   Creatinine, Ser 0.78 0.44 - 1.00 mg/dL   Calcium 8.8 (L) 8.9 - 10.3 mg/dL   GFR, Estimated >60 >60 mL/min    Comment: (NOTE) Calculated using the CKD-EPI Creatinine Equation (2021)    Anion gap 7 5 - 15    Comment: Performed at [facility], [address] [place], [place] [address]  CBC     Status: Abnormal   Collection Time: 04/23/21  4:32 AM  Result Value  Ref Range   WBC 9.2 4.0 - 10.5 K/uL   RBC 3.96 3.87 - 5.11 MIL/uL   Hemoglobin 11.9 (L) 12.0 - 15.0 g/dL   HCT 35.0 (L) 36.0 - 46.0 %   MCV 88.4 80.0 - 100.0 fL   MCH 30.1 26.0 - 34.0 pg   MCHC 34.0 30.0 - 36.0 g/dL   RDW 13.1 11.5 - 15.5 %   Platelets 368 150 - 400 K/uL   nRBC 0.0 0.0 - 0.2 %    Comment: Performed at [facility], [address] [place], [place] [address]  Glucose, capillary     Status: Abnormal   Collection Time: 04/23/21  7:56 AM  Result Value Ref Range   Glucose-Capillary 112 (H) 70 - 99 mg/dL    Comment: Glucose reference range applies only to samples taken after fasting for at least 8 hours.   Comment 1 Notify RN    Comment 2 Document in Chart   Glucose, capillary     Status: Abnormal   Collection Time: 04/23/21 12:00 PM  Result Value Ref Range   Glucose-Capillary 113 (H) 70 - 99 mg/dL    Comment: Glucose reference range applies only to samples  taken after fasting for at least 8 hours.   Comment 1 Notify RN    Comment 2 Document in Chart   Glucose, capillary     Status: None   Collection Time: 04/23/21  4:50 PM  Result Value Ref Range   Glucose-Capillary 99 70 - 99 mg/dL    Comment: Glucose reference range applies only to samples taken after fasting for at least 8 hours.  Glucose, capillary     Status: None   Collection Time: 04/23/21  8:52 PM  Result Value Ref Range   Glucose-Capillary 98 70 - 99 mg/dL    Comment: Glucose reference range applies only to samples taken after fasting for at least 8 hours.  CBC     Status: None   Collection Time: 04/24/21  7:11 AM  Result Value Ref Range   WBC 8.4 4.0 - 10.5 K/uL   RBC 4.34 3.87 - 5.11 MIL/uL   Hemoglobin 12.7 12.0 - 15.0 g/dL   HCT 38.1 36.0 - 46.0 %   MCV 87.8 80.0 - 100.0 fL   MCH 29.3 26.0 - 34.0 pg   MCHC 33.3 30.0 - 36.0 g/dL   RDW 13.1 11.5 - 15.5 %   Platelets 399 150 - 400 K/uL   nRBC 0.0 0.0 - 0.2 %    Comment: Performed at [facility], [address]  [place], [place] [address]  Comprehensive metabolic panel     Status: Abnormal   Collection Time: 04/24/21  7:11 AM  Result Value Ref Range   Sodium 138 135 - 145 mmol/L   Potassium 3.8 3.5 - 5.1 mmol/L   Chloride 99 98 - 111 mmol/L   CO2 29 22 - 32 mmol/L   Glucose, Bld 108 (H) 70 - 99 mg/dL    Comment: Glucose reference range applies only to samples taken after fasting for at least 8 hours.   BUN 9 6 - 20 mg/dL   Creatinine, Ser 0.66 0.44 - 1.00 mg/dL   Calcium 9.4 8.9 - 10.3 mg/dL   Total Protein 6.2 (L) 6.5 - 8.1 g/dL   Albumin 3.5 3.5 - 5.0 g/dL   AST 19 15 - 41 U/L   ALT 43 0 - 44 U/L   Alkaline Phosphatase 61 38 - 126 U/L   Total Bilirubin 0.5 0.3 - 1.2 mg/dL   GFR, Estimated >60 >60 mL/min    Comment: (NOTE) Calculated using the CKD-EPI Creatinine Equation (2021)    Anion gap 10 5 - 15    Comment: Performed at [facility], [address] [place], [place] [address]  Magnesium     Status: None   Collection Time: 04/24/21  7:11 AM  Result Value Ref Range   Magnesium 1.8 1.7 - 2.4 mg/dL    Comment: Performed at [facility], [address] [place], [place] [address]  Glucose, capillary     Status: Abnormal   Collection Time: 04/24/21  8:01 AM  Result Value Ref Range   Glucose-Capillary 123 (H) 70 - 99 mg/dL    Comment: Glucose reference range applies only to samples taken after fasting for at least 8 hours.   Comment 1 Notify RN    Comment 2 Document in Chart   Glucose, capillary     Status: Abnormal   Collection Time: 04/24/21 11:46 AM  Result Value Ref Range   Glucose-Capillary 134 (H) 70 - 99 mg/dL    Comment: Glucose reference range applies only to samples taken after fasting for at  least 8 hours.   Comment 1 Notify RN    Comment 2 Document in Chart    Objective  Body mass index is 29.55 kg/m. Wt Readings from Last 3 Encounters:  06/13/21 167 lb 6.4 oz (75.9 kg)  06/12/21 166 lb 12.8 oz (75.7 kg)  04/30/21 175 lb (79.4 kg)   Temp Readings  from Last 3 Encounters:  06/13/21 (!) 97.3 F (36.3 C) (Oral)  06/12/21 97.6 F (36.4 C) (Temporal)  04/24/21 98.8 F (37.1 C) (Oral)   BP Readings from Last 3 Encounters:  06/13/21 110/80  04/30/21 128/78  04/24/21 (!) 151/87   Pulse Readings from Last 3 Encounters:  06/13/21 76  04/30/21 71  04/24/21 79    Physical Exam Vitals and nursing note reviewed.  Constitutional:      Appearance: Normal appearance. She is well-developed and well-groomed.  HENT:     Head: Normocephalic and atraumatic.  Eyes:     Conjunctiva/sclera: Conjunctivae normal.     Pupils: Pupils are equal, round, and reactive to light.  Cardiovascular:     Rate and Rhythm: Normal rate and regular rhythm.     Heart sounds: Normal heart sounds. No murmur heard. Pulmonary:     Effort: Pulmonary effort is normal.     Breath sounds: Normal breath sounds.  Abdominal:     General: Abdomen is flat. Bowel sounds are normal.     Tenderness: There is no abdominal tenderness.  Musculoskeletal:        General: No tenderness.  Skin:    General: Skin is warm and dry.  Neurological:     General: No focal deficit present.     Mental Status: She is alert and oriented to person, place, and time. Mental status is at baseline.     Cranial Nerves: Cranial nerves 2-12 are intact.     Gait: Gait is intact.  Psychiatric:        Attention and Perception: Attention and perception normal.        Mood and Affect: Mood and affect normal.        Speech: Speech normal.        Behavior: Behavior normal. Behavior is cooperative.        Thought Content: Thought content normal.        Cognition and Memory: Cognition and memory normal.        Judgment: Judgment normal.    Assessment  Plan  Chronic nausea - Plan: promethazine (PHENERGAN) 25 MG tablet alt with zofran NM PET Image Initial (PI) Whole Body (F-18 FDG), promethazine (PHENERGAN) 25 MG tablet, NM PET Image Initial (PI) Skull Base To Thigh (F-18 FDG)  Weight loss -  Plan: NM PET Image Initial (PI) Whole Body (F-18 FDG), NM PET Image Initial (PI) Skull Base To Thigh (F-18 FDG)  Abnormal CBC - Plan: NM PET Image Initial (PI) Whole Body (F-18 FDG), NM PET Image Initial (PI) Skull Base To Thigh (F-18 FDG)  Skin cancer screening - Plan: Ambulatory referral to Dermatology  Skin lesion - Plan: Ambulatory referral to Dermatology  Dizziness  Stop benicar 20-12.5 1/2 tablet, cont norvasc 10 mg qd imdur 30 mg er  Hydrate  Orthostatics negative today  HM Declines flu shot Tdap will do in future   1/2 shingrix today Consider pna 23, prevnar  covid had 3/3 rec 4th    S/p hysterectomy will ask at f/u if had abnormal pap ? If left ovary still intact right ovary appears out per imaging -established with  [facility]    Dr. Jonathon Bellows colonoscopy had 07/11/19 and 07/29/19 and  EGD had 07/11/19 with path no malignancy concern  H/o sigmoid resection in past for h/o diverticulitis   07/29/19 colonoscopy neg bx f/u in 10 years oer GI   -mammo 03/01/21 negative  mammogram  12/15/19  IMPRESSION: 1.  No mammographic or ultrasound evidence for malignancy. 2. Possible focal area of fat necrosis in the 12:30 o'clock location of the RIGHT breast, warranting follow-up.   RECOMMENDATION: Recommend RIGHT breast ultrasound in 6 months to assess stability.   rec smoking cessation smoking < or = 0.5 ppd also using THC since age 54 y.o rec cessation    Eye MD appt 06/25/21   Provider: Dr. Olivia Mackie [facility]

## 2021-06-12 NOTE — Patient Instructions (Addendum)
Pfizer bivalent do in 2-4 weeks 06/2021  Then prevnar do 07/2021 call here for this  Stop benicar-hctz 1/2 pill for now  Zoster Vaccine, Recombinant injection What is this medication? ZOSTER VACCINE (ZOS ter vak SEEN) is a vaccine used to reduce the risk of getting shingles. This vaccine is not used to treat shingles or nerve pain from shingles. This medicine may be used for other purposes; ask your health care provider or pharmacist if you have questions. COMMON BRAND NAME(S): [facility] What should I tell my care team before I take this medication? They need to know if you have any of these conditions: cancer immune system problems an unusual or allergic reaction to Zoster vaccine, other medications, foods, dyes, or preservatives pregnant or trying to get pregnant breast-feeding How should I use this medication? This vaccine is injected into a muscle. It is given by a health care provider. A copy of Vaccine Information Statements will be given before each vaccination. Be sure to read this information carefully each time. This sheet may change often. Talk to your health care provider about the use of this vaccine in children. This vaccine is not approved for use in children. Overdosage: If you think you have taken too much of this medicine contact a poison control center or emergency room at once. NOTE: This medicine is only for you. Do not share this medicine with others. What if I miss a dose? Keep appointments for follow-up (booster) doses. It is important not to miss your dose. Call your health care provider if you are unable to keep an appointment. What may interact with this medication? medicines that suppress your immune system medicines to treat cancer steroid medicines like prednisone or cortisone This list may not describe all possible interactions. Give your health care provider a list of all the medicines, herbs, non-prescription drugs, or dietary supplements you use. Also tell  them if you smoke, drink alcohol, or use illegal drugs. Some items may interact with your medicine. What should I watch for while using this medication? Visit your health care provider regularly. This vaccine, like all vaccines, may not fully protect everyone. What side effects may I notice from receiving this medication? Side effects that you should report to your doctor or health care professional as soon as possible: allergic reactions (skin rash, itching or hives; swelling of the face, lips, or tongue) trouble breathing Side effects that usually do not require medical attention (report these to your doctor or health care professional if they continue or are bothersome): chills headache fever nausea pain, redness, or irritation at site where injected tiredness vomiting This list may not describe all possible side effects. Call your doctor for medical advice about side effects. You may report side effects to FDA at 1-800-FDA-1088. Where should I keep my medication? This vaccine is only given by a health care provider. It will not be stored at home. NOTE: This sheet is a summary. It may not cover all possible information. If you have questions about this medicine, talk to your doctor, pharmacist, or health care provider.  2022 Elsevier/Gold Standard (2021-03-19 00:00:00)  Pneumococcal Conjugate Vaccine (Prevnar 13) Suspension for Injection What is this medication? PNEUMOCOCCAL VACCINE (NEU mo KOK al vak SEEN) is a vaccine used to prevent pneumococcus bacterial infections. These bacteria can cause serious infections like pneumonia, meningitis, and blood infections. This vaccine will lower your chance of getting pneumonia. If you do get pneumonia, it can make your symptoms milder and your illness shorter. This vaccine  will not treat an infection and will not cause infection. This vaccine is recommended for infants and young children, adults with certain medical conditions, and adults 93 years  or older. This medicine may be used for other purposes; ask your health care provider or pharmacist if you have questions. COMMON BRAND NAME(S): Prevnar, Prevnar 13 What should I tell my care team before I take this medication? They need to know if you have any of these conditions: bleeding problems fever immune system problems an unusual or allergic reaction to pneumococcal vaccine, diphtheria toxoid, other vaccines, latex, other medicines, foods, dyes, or preservatives pregnant or trying to get pregnant breast-feeding How should I use this medication? This vaccine is for injection into a muscle. It is given by a health care professional. A copy of Vaccine Information Statements will be given before each vaccination. Read this sheet carefully each time. The sheet may change frequently. Talk to your pediatrician regarding the use of this medicine in children. While this drug may be prescribed for children as young as 64 weeks old for selected conditions, precautions do apply. Overdosage: If you think you have taken too much of this medicine contact a poison control center or emergency room at once. NOTE: This medicine is only for you. Do not share this medicine with others. What if I miss a dose? It is important not to miss your dose. Call your doctor or health care professional if you are unable to keep an appointment. What may interact with this medication? medicines for cancer chemotherapy medicines that suppress your immune function steroid medicines like prednisone or cortisone This list may not describe all possible interactions. Give your health care provider a list of all the medicines, herbs, non-prescription drugs, or dietary supplements you use. Also tell them if you smoke, drink alcohol, or use illegal drugs. Some items may interact with your medicine. What should I watch for while using this medication? Mild fever and pain should go away in 3 days or less. Report any unusual  symptoms to your doctor or health care professional. What side effects may I notice from receiving this medication? Side effects that you should report to your doctor or health care professional as soon as possible: allergic reactions like skin rash, itching or hives, swelling of the face, lips, or tongue breathing problems confused fast or irregular heartbeat fever over 102 degrees F seizures unusual bleeding or bruising unusual muscle weakness Side effects that usually do not require medical attention (report to your doctor or health care professional if they continue or are bothersome): aches and pains diarrhea fever of 102 degrees F or less headache irritable loss of appetite pain, tender at site where injected trouble sleeping This list may not describe all possible side effects. Call your doctor for medical advice about side effects. You may report side effects to FDA at 1-800-FDA-1088. Where should I keep my medication? This does not apply. This vaccine is given in a clinic, pharmacy, doctor's office, or other health care setting and will not be stored at home. NOTE: This sheet is a summary. It may not cover all possible information. If you have questions about this medicine, talk to your doctor, pharmacist, or health care provider.  2022 Elsevier/Gold Standard (2014-04-06 00:00:00)

## 2021-06-13 ENCOUNTER — Encounter: Payer: Self-pay | Admitting: Internal Medicine

## 2021-06-13 ENCOUNTER — Encounter: Payer: Self-pay | Admitting: Pulmonary Disease

## 2021-06-13 ENCOUNTER — Ambulatory Visit (INDEPENDENT_AMBULATORY_CARE_PROVIDER_SITE_OTHER): Payer: Medicare Other | Admitting: Pulmonary Disease

## 2021-06-13 VITALS — BP 110/80 | HR 76 | Temp 97.3°F | Ht 63.0 in | Wt 167.4 lb

## 2021-06-13 DIAGNOSIS — R0602 Shortness of breath: Secondary | ICD-10-CM

## 2021-06-13 DIAGNOSIS — J418 Mixed simple and mucopurulent chronic bronchitis: Secondary | ICD-10-CM

## 2021-06-13 DIAGNOSIS — Z87891 Personal history of nicotine dependence: Secondary | ICD-10-CM | POA: Diagnosis not present

## 2021-06-13 DIAGNOSIS — F1910 Other psychoactive substance abuse, uncomplicated: Secondary | ICD-10-CM

## 2021-06-13 MED ORDER — BREZTRI AEROSPHERE 160-9-4.8 MCG/ACT IN AERO: 2.0000 | INHALATION_SPRAY | Freq: Two times a day (BID) | RESPIRATORY_TRACT | 3 refills | Status: AC

## 2021-06-13 NOTE — Progress Notes (Signed)
Subjective:    Patient ID: Shawna Anderson, female    DOB: Jan 11, 1966, 55 y.o.   MRN: [id number] Chief Complaint  Patient presents with   Follow-up    COPD-    HPI Patient is a 55 year old former smoker (quit 12/2020) who presents for follow-up on the issue of recurrent bronchitis.  She was initially evaluated here on 02 October 2020, last visit here 17 January 2021.  She has been placed on Breztri 2 puffs twice a day.  She notes that this medication keeps her well controlled.  As noted she quit smoking in June 2022 however she continues to engage in daily marijuana smoking and also uses cocaine.  She does not endorse any respiratory symptoms today.  She does usually have exacerbations with change in season/weather but this time has been able to do better.  She has not had any recent fevers, chills or sweats.  No chest pain.  She was admitted to [facility] on 10 October for nausea and vomiting but no issues with chronic bronchitis exacerbation.  She was noted to be positive for marijuana and cocaine on a drug screen performed during her admission at that time.  DATA 09/12/2026 left heart cath: Moderately severe multivessel coronary artery disease, hyperdynamic left ventricle, moderately elevated filling pressures 01/18/2021 PFTs: FEV1 2.27 L or 86% predicted, FVC 2.91 L or 86% predicted, FEV1/FVC 78%, no bronchodilator response.  ERV 26%.  Diffusion capacity mildly reduced.  Consistent with mild restrictive physiology due to obesity. 04/22/2021 UDS: Positive for cocaine metabolites, opiate metabolites and cannabinoids.  Review of Systems A 10 point review of systems was performed and it is as noted above otherwise negative.  Patient Active Problem List   Diagnosis Date Noted   Nausea & vomiting 04/24/2021   Polysubstance abuse ([place]) 04/22/2021   Hypertension    Epigastric abdominal pain    Intractable nausea and vomiting 02/19/2021   Epigastric pain 02/19/2021   SIRS (systemic inflammatory  response syndrome) ([place]) 02/19/2021   High anion gap metabolic acidosis 81/19/1478   Elevated serum creatinine 02/19/2021   Diabetes mellitus without complication ([place])    Sepsis ([place]) 02/10/2021   Hypertension associated with diabetes ([place]) 01/23/2021   Obesity (BMI 30-39.9) 01/23/2021   Gastroparesis    Diverticulitis 01/01/2021   AKI (acute kidney injury) ([place]) 12/31/2020   Hypokalemia 12/31/2020   Transaminitis 12/31/2020   Acute diverticulitis 12/17/2020   Chronic pain syndrome 12/05/2020   Acute bronchitis with COPD ([place]) 11/28/2020   Cough 11/28/2020   Accelerating angina (HCC) 09/11/2020   Chronic nausea 03/26/2020   SUI (stress urinary incontinence, female) 03/26/2020   Bronchitis 03/26/2020   Continuous LLQ abdominal pain 03/01/2020   URI with cough and congestion 03/01/2020   Esophageal dysphagia 01/26/2020   Schatzki's ring of distal esophagus 01/26/2020   Ecchymosis 01/11/2020   Chronic pain of both knees 01/11/2020   Bruising 12/05/2019   Trichomonas infection 12/05/2019   Elevated liver enzymes 11/15/2019   Diabetes mellitus with gastroparesis ([place]) 11/15/2019   STD exposure 11/15/2019   Anemia 11/15/2019   Chronic bursitis of right shoulder 10/25/2019   Chest wall pain 09/05/2019   Poor dentition 08/18/2019   Thrombocytosis 08/18/2019   Leukocytosis 08/18/2019   Anal lesion 08/18/2019   Female pelvic pain 05/26/2019   Colitis 05/26/2019   HLD (hyperlipidemia) 12/27/2018   Vitamin D deficiency 12/24/2018   Bipolar disorder ([place]) 12/30/2017   OSA (obstructive sleep apnea) 12/30/2017   Constipation 12/30/2017   Coronary artery disease of  native artery of native heart with stable angina pectoris ([place]) 12/30/2017   Gastroesophageal reflux disease 12/30/2017   Asthma 12/29/2017   COPD (chronic obstructive pulmonary disease) ([place]) 12/29/2017   Anxiety and depression 12/29/2017   Insomnia 12/29/2017   Chronic back pain 12/29/2017   Skin lesion of back  12/29/2017   CAD S/P CFX PCI 2010 04/15/2016   Essential hypertension 04/15/2016   Hyperlipidemia associated with type 2 diabetes mellitus ([place]) 04/15/2016   Chest pain with moderate risk of acute coronary syndrome 04/12/2016   Chronic abdominal pain 01/12/2015   Vomiting 01/12/2015   Nausea and vomiting 01/12/2015   Social History   Tobacco Use   Smoking status: Former    Packs/day: 1.00    Years: 30.00    Pack years: 30.00    Types: Cigarettes    Quit date: 12/31/2020    Years since quitting: 0.4   Smokeless tobacco: Never   Tobacco comments:    Quit after being admitted on 6/20 01/17/2021  Substance Use Topics   Alcohol use: Yes    Comment: once a year   Social History   Substance and Sexual Activity  Drug Use Yes   Frequency: 7.0 times per week   Types: Marijuana, Cocaine   Comment: 1 per day   Allergies  Allergen Reactions   Ativan [Lorazepam]     Pt states it makes her tongue do weird things    Augmentin [Amoxicillin-Pot Clavulanate]     Upset stomach    Lactose Intolerance ([place])    Latex Other (See Comments)    Patient stated that she was told by her doctor that she is "allergic to" this   Tape Other (See Comments)    Patient stated that she was told by her doctor that she is "allergic to" this   Xifaxan [Rifaximin]     Pt believes this is contributing to her abdominal pain/discomfort   Drixoral Allergy Sinus [Dexbromphen-Pse-Apap Er] Rash   Current Meds  Medication Sig   albuterol (PROAIR HFA) 108 (90 Base) MCG/ACT inhaler Inhale 1-2 puffs into the lungs every 4 (four) hours as needed for wheezing or shortness of breath.   amLODipine (NORVASC) 10 MG tablet TAKE 1 TABLET BY MOUTH AT  BEDTIME   aspirin 81 MG chewable tablet Chew 81 mg by mouth daily.   atorvastatin (LIPITOR) 80 MG tablet TAKE 1 TABLET BY MOUTH  DAILY AT 6 PM.   Budeson-Glycopyrrol-Formoterol (BREZTRI AEROSPHERE) 160-9-4.8 MCG/ACT AERO Inhale 2 puffs into the lungs in the morning and at  bedtime. Rinse   clopidogrel (PLAVIX) 75 MG tablet TAKE 1 TABLET BY MOUTH  DAILY   divalproex (DEPAKOTE) 500 MG DR tablet TAKE 1 TABLET BY MOUTH  TWICE DAILY   ezetimibe (ZETIA) 10 MG tablet TAKE 1 TABLET BY MOUTH  DAILY WITH LIPITOR 80 MG AT NIGHT   isosorbide mononitrate (IMDUR) 30 MG 24 hr tablet Take 3 tablets (90 mg) by mouth once daily   methocarbamol (ROBAXIN) 500 MG tablet Take 1 tablet (500 mg total) by mouth 2 (two) times daily as needed for muscle spasms.   metoCLOPramide (REGLAN) 5 MG tablet TAKE 1 TABLET BY MOUTH 4  TIMES DAILY BEFORE MEALS  AND AT BEDTIME   metoCLOPramide (REGLAN) 5 MG tablet Take 1 tablet (5 mg total) by mouth 4 (four) times daily -  before meals and at bedtime.   nitroGLYCERIN (NITROSTAT) 0.4 MG SL tablet DISSOLVE 1 TABLET UNDER  TONGUE EVERY 5 MINUTES AS  NEEDED FOR CHEST  PAIN MAX  OF 3 TABLETS IN 15 MINUTES  . CALL 911 AFTER THIRD DOSE   ondansetron (ZOFRAN-ODT) 4 MG disintegrating tablet DISSOLVE 1 TABLET ON THE  TONGUE EVERY 8 HOURS AS  NEEDED FOR NAUEA OR  VOMITING   pantoprazole (PROTONIX) 40 MG tablet TAKE 1 TABLET BY MOUTH  TWICE DAILY BEFORE MEALS   promethazine (PHENERGAN) 25 MG tablet Take 1 tablet (25 mg total) by mouth 2 (two) times daily as needed for nausea or vomiting.   promethazine (PHENERGAN) 25 MG tablet Take 1 tablet (25 mg total) by mouth 2 (two) times daily as needed for nausea or vomiting.   REPATHA SURECLICK 132 MG/ML SOAJ INJECT 140MG  SUBCUTANEOUSLY EVERY 2 WEEKS   traMADol (ULTRAM) 50 MG tablet Take 1 tablet (50 mg total) by mouth every 6 (six) hours as needed for moderate pain.   traZODone (DESYREL) 150 MG tablet TAKE 1 TABLET BY MOUTH AT  BEDTIME AS NEEDED FOR SLEEP   [DISCONTINUED] Budeson-Glycopyrrol-Formoterol (BREZTRI AEROSPHERE) 160-9-4.8 MCG/ACT AERO Inhale 2 puffs into the lungs in the morning and at bedtime. Rinse   Immunization History  Administered Date(s) Administered   PFIZER(Purple Top)SARS-COV-2 Vaccination 11/04/2019,  11/18/2019, 04/23/2020      Objective:   Physical Exam BP 110/80 (BP Location: Left Arm, Patient Position: Sitting, Cuff Size: Normal)   Pulse 76   Temp (!) 97.3 F (36.3 C) (Oral)   Ht 5\' 3"  (1.6 m)   Wt 167 lb 6.4 oz (75.9 kg)   SpO2 98%   BMI 29.65 kg/m  GENERAL: Obese woman, no acute distress.  Fully ambulatory. No conversational dyspnea. HEAD: Normocephalic, atraumatic. EYES: Pupils equal, round, reactive to light.  No scleral icterus. MOUTH: Nose/mouth/throat not examined due to masking requirements for COVID 19. NECK: Supple. No thyromegaly. Trachea midline. No JVD.  No adenopathy. PULMONARY: Good air entry bilaterally.  No adventitious sounds. CARDIOVASCULAR: S1 and S2. Regular rate and rhythm.  No rubs, murmurs or gallops heard. ABDOMEN: Obese. MUSCULOSKELETAL: No joint deformity, no clubbing, no edema. NEUROLOGIC: No focal deficit, no gait disturbance, speech is fluent. SKIN: Intact,warm,dry. PSYCH: Avoids eye contact.    Assessment & Plan:     ICD-10-CM   1. Mixed simple and mucopurulent chronic bronchitis (HCC)  J41.8    PFTs more consistent with restriction due to obesity If COPD present, mild Continue Breztri 2 puffs twice a day Has discontinued smoking    2. Shortness of breath  R06.02    Improved on Breztri     3. Polysubstance abuse ([place])  F19.10    This issue adds complexity to her management Marijuana can still cause lung damage Cocaine will affect upper airway function/cardiac function    4. Former heavy cigarette smoker (20-39 per day)  Z87.891    No evidence of relapse      We will see the patient in follow-up in 6 months time she is to contact us prior to that time should any new difficulties arise.  Renold Don, MD Advanced Bronchoscopy PCCM [facility]    *This note was dictated using voice recognition software/Dragon.  Despite best efforts to proofread, errors can occur which can change the meaning.  Any change  was purely unintentional.

## 2021-06-13 NOTE — Patient Instructions (Signed)
Continue Breztri 2 puffs twice a day  Continue as needed albuterol   We will see him in follow-up in 6 months time call sooner should any new problems arise.

## 2021-06-14 NOTE — Addendum Note (Signed)
Addended by: Fulton Mole D on: 06/14/2021 11:15 AM   Modules accepted: Orders

## 2021-06-16 ENCOUNTER — Other Ambulatory Visit: Payer: Self-pay | Admitting: Internal Medicine

## 2021-06-16 DIAGNOSIS — E669 Obesity, unspecified: Secondary | ICD-10-CM

## 2021-06-16 DIAGNOSIS — I152 Hypertension secondary to endocrine disorders: Secondary | ICD-10-CM

## 2021-06-17 ENCOUNTER — Telehealth: Payer: Self-pay

## 2021-06-17 NOTE — Telephone Encounter (Signed)
Called pt discussed, appt scheduled here with Dr Raliegh Ip Thursday Dec 8 at 4:15

## 2021-06-18 ENCOUNTER — Ambulatory Visit (INDEPENDENT_AMBULATORY_CARE_PROVIDER_SITE_OTHER): Payer: Medicare Other | Admitting: Podiatry

## 2021-06-18 ENCOUNTER — Other Ambulatory Visit: Payer: Self-pay

## 2021-06-18 ENCOUNTER — Encounter: Payer: Self-pay | Admitting: Podiatry

## 2021-06-18 DIAGNOSIS — M722 Plantar fascial fibromatosis: Secondary | ICD-10-CM

## 2021-06-18 DIAGNOSIS — E119 Type 2 diabetes mellitus without complications: Secondary | ICD-10-CM

## 2021-06-18 NOTE — Progress Notes (Signed)
Subjective:  Patient ID: Shawna Anderson, female    DOB: 01/21/1966,  MRN: [id number]  Chief Complaint  Patient presents with   Plantar Fasciitis    55 y.o. female presents with the above complaint.  Patient presents for follow-up of bilateral [place] fasciitis.  Patient states that she had that hip surgery done in [place].  She states that she has some of her pain that came back.  The injection helped considerably.  She is about 70 to 80% improved.  She would like to redo another injection if possible.  She is also here for a foot exam.  She states that she is a diabetic with last A1c of 6.2.  She does not have any neuropathy or pins-and-needles type of pain.  Review of Systems: Negative except as noted in the HPI. Denies N/V/F/Ch.  Past Medical History:  Diagnosis Date   Anxiety    Asthma    Bipolar 1 disorder ([place])    Bipolar disorder ([place])    CAD (coronary artery disease)    s/p stent BMS OM Cx   Cervical herniated disc 04/12/2016   COPD (chronic obstructive pulmonary disease) (HCC)    Depression    Diabetes mellitus without complication (HCC)    Diverticulitis    GERD (gastroesophageal reflux disease)    Glaucoma    History of blood transfusion    Hyperlipidemia    Hypertension    OSA (obstructive sleep apnea)    not using cpap    Plantar fasciitis    b/l feet s/p steroid shots w/o help and left surgery w/o help    UTI (urinary tract infection)     Current Outpatient Medications:    albuterol (PROAIR HFA) 108 (90 Base) MCG/ACT inhaler, Inhale 1-2 puffs into the lungs every 4 (four) hours as needed for wheezing or shortness of breath., Disp: 54 g, Rfl: 3   amLODipine (NORVASC) 10 MG tablet, TAKE 1 TABLET BY MOUTH AT  BEDTIME, Disp: 90 tablet, Rfl: 3   aspirin 81 MG chewable tablet, Chew 81 mg by mouth daily., Disp: , Rfl:    atorvastatin (LIPITOR) 80 MG tablet, TAKE 1 TABLET BY MOUTH  DAILY AT 6 PM., Disp: 90 tablet, Rfl: 3   Budeson-Glycopyrrol-Formoterol (BREZTRI  AEROSPHERE) 160-9-4.8 MCG/ACT AERO, Inhale 2 puffs into the lungs in the morning and at bedtime. Rinse, Disp: 32.1 g, Rfl: 3   clopidogrel (PLAVIX) 75 MG tablet, TAKE 1 TABLET BY MOUTH  DAILY, Disp: 90 tablet, Rfl: 3   divalproex (DEPAKOTE) 500 MG DR tablet, TAKE 1 TABLET BY MOUTH  TWICE DAILY, Disp: 180 tablet, Rfl: 3   ezetimibe (ZETIA) 10 MG tablet, TAKE 1 TABLET BY MOUTH  DAILY WITH LIPITOR 80 MG AT NIGHT, Disp: 90 tablet, Rfl: 3   isosorbide mononitrate (IMDUR) 30 MG 24 hr tablet, Take 3 tablets (90 mg) by mouth once daily, Disp: 90 tablet, Rfl: 6   methocarbamol (ROBAXIN) 500 MG tablet, Take 1 tablet (500 mg total) by mouth 2 (two) times daily as needed for muscle spasms., Disp: 180 tablet, Rfl: 1   metoCLOPramide (REGLAN) 5 MG tablet, TAKE 1 TABLET BY MOUTH 4  TIMES DAILY BEFORE MEALS  AND AT BEDTIME, Disp: 360 tablet, Rfl: 2   metoCLOPramide (REGLAN) 5 MG tablet, Take 1 tablet (5 mg total) by mouth 4 (four) times daily -  before meals and at bedtime., Disp: 120 tablet, Rfl: 0   nitroGLYCERIN (NITROSTAT) 0.4 MG SL tablet, DISSOLVE 1 TABLET UNDER  TONGUE EVERY 5  MINUTES AS  NEEDED FOR CHEST PAIN MAX  OF 3 TABLETS IN 15 MINUTES  . CALL 911 AFTER THIRD DOSE, Disp: 75 tablet, Rfl: 4   ondansetron (ZOFRAN-ODT) 4 MG disintegrating tablet, DISSOLVE 1 TABLET ON THE  TONGUE EVERY 8 HOURS AS  NEEDED FOR NAUEA OR  VOMITING, Disp: 270 tablet, Rfl: 1   pantoprazole (PROTONIX) 40 MG tablet, TAKE 1 TABLET BY MOUTH  TWICE DAILY BEFORE MEALS, Disp: 180 tablet, Rfl: 3   promethazine (PHENERGAN) 25 MG tablet, Take 1 tablet (25 mg total) by mouth 2 (two) times daily as needed for nausea or vomiting., Disp: 180 tablet, Rfl: 0   promethazine (PHENERGAN) 25 MG tablet, Take 1 tablet (25 mg total) by mouth 2 (two) times daily as needed for nausea or vomiting., Disp: 20 tablet, Rfl: 0   REPATHA SURECLICK 161 MG/ML SOAJ, INJECT 140MG  SUBCUTANEOUSLY EVERY 2 WEEKS, Disp: 6 mL, Rfl: 0   traMADol (ULTRAM) 50 MG tablet, Take 1  tablet (50 mg total) by mouth every 6 (six) hours as needed for moderate pain., Disp: 20 tablet, Rfl: 0   traZODone (DESYREL) 150 MG tablet, TAKE 1 TABLET BY MOUTH AT  BEDTIME AS NEEDED FOR SLEEP, Disp: 90 tablet, Rfl: 3 No current facility-administered medications for this visit.  Facility-Administered Medications Ordered in Other Visits:    albuterol (PROVENTIL) (2.5 MG/3ML) 0.083% nebulizer solution 2.5 mg, 2.5 mg, Nebulization, Once, Tyler Pita, MD  Social History   Tobacco Use  Smoking Status Former   Packs/day: 1.00   Years: 30.00   Pack years: 30.00   Types: Cigarettes   Quit date: 12/31/2020   Years since quitting: 0.4  Smokeless Tobacco Never  Tobacco Comments   Quit after being admitted on 6/20 01/17/2021    Allergies  Allergen Reactions   Ativan [Lorazepam]     Pt states it makes her tongue do weird things    Augmentin [Amoxicillin-Pot Clavulanate]     Upset stomach    Lactose Intolerance (Gi)    Latex Other (See Comments)    Patient stated that she was told by her doctor that she is "allergic to" this   Tape Other (See Comments)    Patient stated that she was told by her doctor that she is "allergic to" this   Xifaxan [Rifaximin]     Pt believes this is contributing to her abdominal pain/discomfort   Drixoral Allergy Sinus [Dexbromphen-Pse-Apap Er] Rash   Objective:  There were no vitals filed for this visit. There is no height or weight on file to calculate BMI. Constitutional Well developed. Well nourished.  Vascular Dorsalis pedis pulses palpable bilaterally. Posterior tibial pulses palpable bilaterally. Capillary refill normal to all digits.  No cyanosis or clubbing noted. Pedal hair growth normal.  Neurologic Normal speech. Oriented to person, place, and time. Epicritic sensation to light touch grossly present bilaterally.  Protective sensation intact.  Vibratory sensation intact.  Dermatologic Nails well groomed and normal in appearance. No  open wounds. No skin lesions.  Orthopedic: Normal joint ROM without pain or crepitus bilaterally. No visible deformities. Tender to palpation at the calcaneal tuber bilaterally. No pain with calcaneal squeeze bilaterally. Ankle ROM diminished range of motion bilaterally. Silfverskiold Test: positive bilaterally.   Radiographs: Taken and reviewed. No acute fractures or dislocations. No evidence of stress fracture.  Plantar heel spur present. Posterior heel spur present.   Assessment:   No diagnosis found.   Plan:  Patient was evaluated and treated and all questions answered.  Plantar Fasciitis, bilaterally - XR reviewed as above.  - Educated on icing and stretching. Instructions given.  -Third injection delivered to the plantar fascia as below. - DME: Plantar Fascial Brace - Pharmacologic management: None -I read discussed shoe gear modification orthotics.  Procedure: Injection Tendon/Ligament Location: Bilateral plantar fascia at the glabrous junction; medial approach. Skin Prep: alcohol Injectate: 0.5 cc 0.5% marcaine plain, 0.5 cc of 1% Lidocaine, 0.5 cc kenalog 10. Disposition: Patient tolerated procedure well. Injection site dressed with a band-aid.   Assessment: NIDDM Encounter for diabetic foot examination  Plan: Discussed diabetic foot care principles. Literature dispensed on today. Patient to continue soft, supportive shoe gear daily. Patient to report any pedal injuries to medical professional immediately. Follow up one year. Patient/POA to call should there be a concern in the interim. Patient is a low risk for developing ulceration as her foot exam is within normal limits.     No follow-ups on file.

## 2021-06-19 ENCOUNTER — Telehealth: Payer: Self-pay | Admitting: Internal Medicine

## 2021-06-19 NOTE — Telephone Encounter (Signed)
Please call Venia Minks from Pre service. She needs authorization for up coming PET Scan. [phone] Ext [phone]. Patient's insurance is requiring it.

## 2021-06-20 ENCOUNTER — Ambulatory Visit (INDEPENDENT_AMBULATORY_CARE_PROVIDER_SITE_OTHER): Payer: Medicare Other | Admitting: Dermatology

## 2021-06-20 ENCOUNTER — Other Ambulatory Visit: Payer: Self-pay

## 2021-06-20 DIAGNOSIS — L819 Disorder of pigmentation, unspecified: Secondary | ICD-10-CM | POA: Diagnosis not present

## 2021-06-20 DIAGNOSIS — L853 Xerosis cutis: Secondary | ICD-10-CM

## 2021-06-20 DIAGNOSIS — L820 Inflamed seborrheic keratosis: Secondary | ICD-10-CM | POA: Diagnosis not present

## 2021-06-20 DIAGNOSIS — D492 Neoplasm of unspecified behavior of bone, soft tissue, and skin: Secondary | ICD-10-CM

## 2021-06-20 DIAGNOSIS — D485 Neoplasm of uncertain behavior of skin: Secondary | ICD-10-CM

## 2021-06-20 MED ORDER — MOMETASONE FUROATE 0.1 % EX CREA: 1.0000 "application " | TOPICAL_CREAM | CUTANEOUS | 2 refills | Status: DC

## 2021-06-20 NOTE — Progress Notes (Signed)
New Patient Visit  Subjective  Shawna Anderson is a 55 y.o. female who presents for the following: check dark spot (Back, ~2 yrs, growing), dark spots (Arms, just noticed this year), and growth (R popliteal, irritating). The patient has spots, moles and lesions to be evaluated, some may be new or changing and the patient has concerns that these could be cancer.  New patient referral from Dr. Olivia Mackie McLean-Scocuzza.  The following portions of the chart were reviewed this encounter and updated as appropriate:   Tobacco  Allergies  Meds  Problems  Med Hx  Surg Hx  Fam Hx      Review of Systems:  No other skin or systemic complaints except as noted in HPI or Assessment and Plan.  Objective  Well appearing patient in no apparent distress; mood and affect are within normal limits.  A focused examination was performed including back, arms. Relevant physical exam findings are noted in the Assessment and Plan.  bil arms Hyperpigmented macules bil forearms       low back x 1, Total = 1 Erythematous keratotic or waxy stuck-on papule or plaque.   Right Popliteal Fossa 0.6cm fleshy pap   Assessment & Plan   Xerosis - diffuse xerotic patches - recommend gentle, hydrating skin care - gentle skin care handout given - Start Cerave cream qd  Post-inflammatory pigmentary changes bil arms  Start Mometasone cream 5d/wk Monday through Friday  May consider bx on f/u if not improving  Topical steroids (such as triamcinolone, fluocinolone, fluocinonide, mometasone, clobetasol, halobetasol, betamethasone, hydrocortisone) can cause thinning and lightening of the skin if they are used for too long in the same area. Your physician has selected the right strength medicine for your problem and area affected on the body. Please use your medication only as directed by your physician to prevent side effects.    Related Medications mometasone (ELOCON) 0.1 % cream Apply 1 application  topically as directed. Qhs 5 days a week Monday through Friday  Inflamed seborrheic keratosis low back x 1, Total = 1  Destruction of lesion - low back x 1, Total = 1 Complexity: simple   Destruction method: cryotherapy   Informed consent: discussed and consent obtained   Timeout:  patient name, date of birth, surgical site, and procedure verified Lesion destroyed using liquid nitrogen: Yes   Region frozen until ice ball extended beyond lesion: Yes   Outcome: patient tolerated procedure well with no complications   Post-procedure details: wound care instructions given    Neoplasm of skin Right Popliteal Fossa  Epidermal / dermal shaving  Lesion diameter (cm):  0.6 Informed consent: discussed and consent obtained   Timeout: patient name, date of birth, surgical site, and procedure verified   Procedure prep:  Patient was prepped and draped in usual sterile fashion Prep type:  Isopropyl alcohol Anesthesia: the lesion was anesthetized in a standard fashion   Anesthetic:  1% lidocaine w/ epinephrine 1-100,000 buffered w/ 8.4% NaHCO3 Instrument used: flexible razor blade   Hemostasis achieved with: pressure, aluminum chloride and electrodesiccation   Outcome: patient tolerated procedure well   Post-procedure details: sterile dressing applied and wound care instructions given   Dressing type: bandage and petrolatum    Specimen 1 - Surgical pathology Differential Diagnosis: D48.5 Irritated Nevus r/o Atypia  Check Margins: No 0.6cm fleshy pap  Return in about 3 months (around 09/18/2021).  I, Othelia Pulling, RMA, am acting as scribe for Sarina Ser, MD . Documentation: I have reviewed the  above documentation for accuracy and completeness, and I agree with the above.  Sarina Ser, MD

## 2021-06-20 NOTE — Patient Instructions (Addendum)
If You Need Anything After Your Visit  If you have any questions or concerns for your doctor, please call our main line at [phone] and press option 4 to reach your doctor's medical assistant. If no one answers, please leave a voicemail as directed and we will return your call as soon as possible. Messages left after 4 pm will be answered the following business day.   You may also send us a message via MyChart. We typically respond to MyChart messages within 1-2 business days.  For prescription refills, please ask your pharmacy to contact our office. Our fax number is [phone].  If you have an urgent issue when the clinic is closed that cannot wait until the next business day, you can page your doctor at the number below.    Please note that while we do our best to be available for urgent issues outside of office hours, we are not available 24/7.   If you have an urgent issue and are unable to reach us, you may choose to seek medical care at your doctor's office, retail clinic, urgent care center, or emergency room.  If you have a medical emergency, please immediately call 911 or go to the emergency department.  Pager Numbers  - Dr. Kowalski: [phone]  - Dr. Moye: [phone]  - Dr. Stewart: [phone]  In the event of inclement weather, please call our main line at [phone] for an update on the status of any delays or closures.  Dermatology Medication Tips: Please keep the boxes that topical medications come in in order to help keep track of the instructions about where and how to use these. Pharmacies typically print the medication instructions only on the boxes and not directly on the medication tubes.   If your medication is too expensive, please contact our office at [phone] option 4 or send us a message through MyChart.   We are unable to tell what your co-pay for medications will be in advance as this is different depending on your insurance coverage.  However, we may be able to find a substitute medication at lower cost or fill out paperwork to get insurance to cover a needed medication.   If a prior authorization is required to get your medication covered by your insurance company, please allow us 1-2 business days to complete this process.  Drug prices often vary depending on where the prescription is filled and some pharmacies may offer cheaper prices.  The website www.goodrx.com contains coupons for medications through different pharmacies. The prices here do not account for what the cost may be with help from insurance (it may be cheaper with your insurance), but the website can give you the price if you did not use any insurance.  - You can print the associated coupon and take it with your prescription to the pharmacy.  - You may also stop by our office during regular business hours and pick up a GoodRx coupon card.  - If you need your prescription sent electronically to a different pharmacy, notify our office through [facility] MyChart or by phone at [phone] option 4.     Si Usted Necesita Algo Despus de Su Visita  Tambin puede enviarnos un mensaje a travs de MyChart. Por lo general respondemos a los mensajes de MyChart en el transcurso de 1 a 2 das hbiles.  Para renovar recetas, por favor pida a su farmacia que se ponga en contacto con nuestra oficina. Nuestro nmero de fax es el [phone].  Si tiene   un asunto urgente cuando la clnica est cerrada y que no puede esperar hasta el siguiente da hbil, puede llamar/localizar a su doctor(a) al nmero que aparece a continuacin.   Por favor, tenga en cuenta que aunque hacemos todo lo posible para estar disponibles para asuntos urgentes fuera del horario de [place], no estamos disponibles las 24 horas del da, los 7 das de la [place].   Si tiene un problema urgente y no puede comunicarse con nosotros, puede optar por buscar atencin mdica  en el consultorio de su  doctor(a), en una clnica privada, en un centro de atencin urgente o en una sala de emergencias.  Si tiene Engineering geologist, por favor llame inmediatamente al 911 o vaya a la sala de emergencias.  Nmeros de bper  - Dr. Nehemiah Massed: [phone]  - Dra. Moye: [phone]  - Dra. Nicole Kindred: [phone]  En caso de inclemencias del [place], por favor llame a Johnsie Kindred principal al [phone] para una actualizacin sobre el [place] de cualquier retraso o cierre.  Consejos para la medicacin en dermatologa: Por favor, guarde las cajas en las que vienen los medicamentos de uso tpico para ayudarle a seguir las instrucciones sobre dnde y cmo usarlos. Las farmacias generalmente imprimen las instrucciones del medicamento slo en las cajas y no directamente en los tubos del [place].   Si su medicamento es muy caro, por favor, pngase en contacto con Zigmund Daniel llamando al [phone] y presione la opcin 4 o envenos un mensaje a travs de Pharmacist, community.   No podemos decirle cul ser su copago por los medicamentos por adelantado ya que esto es diferente dependiendo de la cobertura de su seguro. Sin embargo, es posible que podamos encontrar un medicamento sustituto a Electrical engineer un formulario para que el seguro cubra el medicamento que se considera necesario.   Si se requiere una autorizacin previa para que su compaa de seguros [place] su medicamento, por favor permtanos de 1 a 2 das hbiles para completar este proceso.  Los precios de los medicamentos varan con frecuencia dependiendo del Environmental consultant de dnde se surte la receta y alguna farmacias pueden ofrecer precios ms baratos.  El sitio web www.goodrx.com tiene cupones para medicamentos de Airline pilot. Los precios aqu no tienen en cuenta lo que podra costar con la ayuda del seguro (puede ser ms barato con su seguro), pero el sitio web puede darle el precio si no utiliz Research scientist (physical sciences).  - Puede imprimir el cupn  correspondiente y llevarlo con su receta a la farmacia.  - Tambin puede pasar por nuestra oficina durante el horario de atencin regular y Charity fundraiser una tarjeta de cupones de GoodRx.  - Si necesita que su receta se enve electrnicamente a una farmacia diferente, informe a nuestra oficina a travs de MyChart de [place] o por telfono llamando al [phone] y presione la opcin 4.   Start Mometasone cream to dark spots on arms 5 days a week Monday through Friday. Start Cerave Cream or Cetaphil cream daily to arms, legs and trunk  Wound Care Instructions  Cleanse wound gently with soap and water once a day then pat dry with clean gauze. Apply a thing coat of Petrolatum (petroleum jelly, "Vaseline") over the wound (unless you have an allergy to this). We recommend that you use a new, sterile tube of Vaseline. Do not pick or remove scabs. Do not remove the yellow or white "healing tissue" from the base of the wound.  Cover the wound with fresh, clean, nonstick  gauze and secure with paper tape. You may use Band-Aids in place of gauze and tape if the would is small enough, but would recommend trimming much of the tape off as there is often too much. Sometimes Band-Aids can irritate the skin.  You should call the office for your biopsy report after 1 week if you have not already been contacted.  If you experience any problems, such as abnormal amounts of bleeding, swelling, significant bruising, significant pain, or evidence of infection, please call the office immediately.  FOR ADULT SURGERY PATIENTS: If you need something for pain relief you may take 1 extra strength Tylenol (acetaminophen) AND 2 Ibuprofen (200mg  each) together every 4 hours as needed for pain. (do not take these if you are allergic to them or if you have a reason you should not take them.) Typically, you may only need pain medication for 1 to 3 days.

## 2021-06-24 ENCOUNTER — Telehealth: Payer: Self-pay

## 2021-06-24 ENCOUNTER — Telehealth: Payer: Self-pay | Admitting: Internal Medicine

## 2021-06-24 NOTE — Telephone Encounter (Signed)
Prior Authorization initiated by [email or web address] for [place]. KEY: [id number] Clinical questions answered.   RESPONSE: [employer] 2017 NCPDP typically responds with questions in less than 15 minutes, but may take up to 24 hours.

## 2021-06-24 NOTE — Telephone Encounter (Signed)
Lft pt vm to call ofc regarding pet scan. thanks

## 2021-06-24 NOTE — Telephone Encounter (Signed)
Pt returning call regarding pet scan

## 2021-06-25 ENCOUNTER — Other Ambulatory Visit: Payer: Medicare Other

## 2021-06-25 ENCOUNTER — Telehealth: Payer: Self-pay

## 2021-06-25 NOTE — Telephone Encounter (Signed)
-----   Message from Ralene Bathe, MD sent at 06/24/2021  5:56 PM EST ----- Diagnosis Skin , right popliteal fossa NEVUS LIPOMATOSUS SUPERFICIALIS  Benign "fatty mole" No further treatment needed

## 2021-06-25 NOTE — Telephone Encounter (Signed)
Discussed biopsy results with pt  °

## 2021-06-28 ENCOUNTER — Encounter: Payer: Self-pay | Admitting: Dermatology

## 2021-07-01 ENCOUNTER — Telehealth: Payer: Self-pay | Admitting: Internal Medicine

## 2021-07-04 NOTE — Telephone Encounter (Signed)
err

## 2021-07-05 ENCOUNTER — Telehealth: Payer: Self-pay | Admitting: Internal Medicine

## 2021-07-05 NOTE — Telephone Encounter (Signed)
Inform pt insurance denied PET scan if she is worried she could have cancer call and make appt with Dr. Janese Banks for follow up who is a blood/cancer specialist

## 2021-07-05 NOTE — Telephone Encounter (Signed)
-----   Message from Ashley Jacobs sent at 07/01/2021 10:53 AM EST ----- Regarding: PET scan Good morning!  Your request was denied We've denied the medical services/items listed below requested by you or your doctor:  [place] (PET) scan - an imaging test that allows a doctor to see  how organs and tissues are working.  Why did we deny your request?  We denied the medical services/items listed above because your records do not show  you meet criteria for the requested PET scan. Your provider asked for [phone] - PET  Scan from head to thigh to evaluate tumor because your records show that someone  related to you had cancer in the past.  Our physician reviewer looked at your records to see if your request could be approved.  Based on Medicare National Coverage Determinations (NCD): 220.6.17 Positron  Emission Tomography (FDG) for Oncologic Conditions, we cannot approve this  request.   The request cannot be approved because: You must have results of a tissue sample taken for lab testing (biopsy) or other  diagnostic testing that confirm or strongly suggest the diagnosis of cancer. This testing  might include blood tests, picture studies, and/or an exam done by your doctor. Unfortunately, the request cannot be approved at this time by Medicare or your health plan  Please advise and Thank you!

## 2021-07-09 NOTE — Progress Notes (Signed)
I called [employer] and spoke with the pharmacist and the Benicar was cancelled.  Shawna Anderson,cma

## 2021-07-09 NOTE — Telephone Encounter (Signed)
Patient informed and verbalized understanding.  Patient has an appoint coming up with Janese Banks soon.

## 2021-07-16 ENCOUNTER — Other Ambulatory Visit: Payer: Medicare Other

## 2021-08-06 ENCOUNTER — Other Ambulatory Visit: Payer: Self-pay | Admitting: Internal Medicine

## 2021-08-10 ENCOUNTER — Other Ambulatory Visit: Payer: Self-pay | Admitting: Internal Medicine

## 2021-08-10 DIAGNOSIS — F319 Bipolar disorder, unspecified: Secondary | ICD-10-CM

## 2021-08-15 ENCOUNTER — Other Ambulatory Visit: Payer: Self-pay | Admitting: Internal Medicine

## 2021-08-15 DIAGNOSIS — I10 Essential (primary) hypertension: Secondary | ICD-10-CM

## 2021-08-16 NOTE — Telephone Encounter (Signed)
This medication was DC 12/19/20 at discharge okay to fill?

## 2021-08-16 NOTE — Telephone Encounter (Signed)
Benicar hctz 40-25  was stopped 06/12/21 at 1/2 dose?  Due to BP running low normal  What has her bp been she is just supposed to be on imdur and norvasc

## 2021-08-17 NOTE — Telephone Encounter (Signed)
Benicar hctz 40-25  was stopped 06/12/21 at 1/2 dose?  Due to BP running low normal  What has her bp been she is just supposed to be on imdur and norvasc

## 2021-08-19 ENCOUNTER — Inpatient Hospital Stay: Payer: Medicare Other

## 2021-08-19 ENCOUNTER — Inpatient Hospital Stay: Payer: Medicare Other | Admitting: Nurse Practitioner

## 2021-08-20 NOTE — Progress Notes (Signed)
Follow-up Outpatient Visit Date: 08/21/2021  Primary Care Provider: McLean-Scocuzza, Nino Glow, MD [place] [id number]  Chief Complaint: Headaches and high blood pressure  HPI:  Shawna Anderson is a 56 y.o. female with history of coronary artery disease status post PCI to the LCx, hypertension, hyperlipidemia, GERD, sleep apnea, asthma, tobacco use, and bipolar/anxiety disorder, who presents for follow-up of coronary artery disease.  She was last seen in our office in 04/2021 by Cadence Furth, PA, following hospitalization earlier in the month for nausea and vomiting.  There was concern for esophagitis and sigmoid diverticulitis.  Of note, urine drug screen was positive for cocaine, marijuana, and opiates.  At her follow-up visit a week later, she continued to complain of chest pain.  Isosorbide mononitrate was increased to 90 mg daily.  Today, Shawna Anderson concerned about intermittent headaches that are associated with high blood pressure.  She believes her elevated blood pressures are driving the headaches.  She had a severe episode yesterday that prompted her to take her morning medications early.  She continued to have headaches with blood pressure readings up to 518 systolic, prompting her to take her evening medication early as well.  She did not have any improvement in her headaches or blood pressure with APAP or ibuprofen.  Around 7 PM, the headache subsided.  She notes that her blood pressure at that time was 95/80.  Shawna Anderson reports that she is still on olmesartan-HCTZ (she does not recall the exact dose), though the medication was not on her list today and notes indicate that it was discontinued by her PCP a few months ago due to symptomatic hypotension.  Shawna Anderson has not had any chest pain nor shortness of breath, palpitations, lightheadedness, or ankle edema.  She reports chronic swelling of her feet that she attributes to plantar fasciitis.  She also  notes some pain in her calves when she walks for 30 minutes.  She is concerned about PAD and notes that she was previously screened regularly for this by a provider in [place].  --------------------------------------------------------------------------------------------------  Past Medical History:  Diagnosis Date   Anxiety    Asthma    Bipolar 1 disorder ([place])    Bipolar disorder ([place])    CAD (coronary artery disease)    s/p stent BMS OM Cx   Cervical herniated disc 04/12/2016   COPD (chronic obstructive pulmonary disease) ([place])    Depression    Diabetes mellitus without complication (HCC)    Diverticulitis    GERD (gastroesophageal reflux disease)    Glaucoma    History of blood transfusion    Hyperlipidemia    Hypertension    OSA (obstructive sleep apnea)    not using cpap    Plantar fasciitis    b/l feet s/p steroid shots w/o help and left surgery w/o help    UTI (urinary tract infection)    Past Surgical History:  Procedure Laterality Date   ABDOMINAL HYSTERECTOMY     ABDOMINAL SURGERY  1995   Bowel resection.   CARDIAC CATHETERIZATION N/A 04/14/2016   Procedure: Left Heart Cath and Coronary Angiography;  Surgeon: Burnell Blanks, MD;  Location: [facility];  Service: Cardiovascular;  Laterality: N/A;   CARDIAC SURGERY     COLONOSCOPY WITH PROPOFOL N/A 07/11/2019   Procedure: COLONOSCOPY WITH PROPOFOL;  Surgeon: Jonathon Bellows, MD;  Location: [facility] ENDOSCOPY;  Service: Gastroenterology;  Laterality: N/A;   COLONOSCOPY WITH PROPOFOL N/A 07/29/2019   Procedure: COLONOSCOPY WITH  PROPOFOL;  Surgeon: Jonathon Bellows, MD;  Location: [facility] ENDOSCOPY;  Service: Gastroenterology;  Laterality: N/A;   CORONARY ANGIOPLASTY WITH STENT PLACEMENT  2010   Drug eluting stent   ESOPHAGOGASTRODUODENOSCOPY (EGD) WITH PROPOFOL N/A 07/11/2019   Procedure: ESOPHAGOGASTRODUODENOSCOPY (EGD) WITH PROPOFOL;  Surgeon: Jonathon Bellows, MD;  Location: [facility] ENDOSCOPY;  Service: Gastroenterology;   Laterality: N/A;   LEFT HEART CATH AND CORONARY ANGIOGRAPHY Left 09/11/2020   Procedure: LEFT HEART CATH AND CORONARY ANGIOGRAPHY;  Surgeon: Nelva Bush, MD;  Location: [facility];  Service: Cardiovascular;  Laterality: Left;   OVARIAN CYST REMOVAL      Current Meds  Medication Sig   albuterol (PROAIR HFA) 108 (90 Base) MCG/ACT inhaler Inhale 1-2 puffs into the lungs every 4 (four) hours as needed for wheezing or shortness of breath.   amLODipine (NORVASC) 10 MG tablet TAKE 1 TABLET BY MOUTH AT  BEDTIME   aspirin 81 MG chewable tablet Chew 81 mg by mouth daily.   atorvastatin (LIPITOR) 80 MG tablet TAKE 1 TABLET BY MOUTH  DAILY AT 6 PM.   Budeson-Glycopyrrol-Formoterol (BREZTRI AEROSPHERE) 160-9-4.8 MCG/ACT AERO Inhale 2 puffs into the lungs in the morning and at bedtime. Rinse   clopidogrel (PLAVIX) 75 MG tablet TAKE 1 TABLET BY MOUTH  DAILY   divalproex (DEPAKOTE) 500 MG DR tablet TAKE 1 TABLET BY MOUTH  TWICE DAILY   ezetimibe (ZETIA) 10 MG tablet TAKE 1 TABLET BY MOUTH  DAILY WITH LIPITOR 80 MG AT NIGHT   isosorbide mononitrate (IMDUR) 30 MG 24 hr tablet Take 3 tablets (90 mg) by mouth once daily   methocarbamol (ROBAXIN) 500 MG tablet Take 1 tablet (500 mg total) by mouth 2 (two) times daily as needed for muscle spasms.   metoCLOPramide (REGLAN) 5 MG tablet TAKE 1 TABLET BY MOUTH 4  TIMES DAILY BEFORE MEALS  AND AT BEDTIME   mometasone (ELOCON) 0.1 % cream Apply 1 application topically as directed. Qhs 5 days a week Monday through Friday   nitroGLYCERIN (NITROSTAT) 0.4 MG SL tablet DISSOLVE 1 TABLET UNDER  TONGUE EVERY 5 MINUTES AS  NEEDED FOR CHEST PAIN MAX  OF 3 TABLETS IN 15 MINUTES  . CALL 911 AFTER THIRD DOSE   ondansetron (ZOFRAN-ODT) 4 MG disintegrating tablet DISSOLVE 1 TABLET ON THE  TONGUE EVERY 8 HOURS AS  NEEDED FOR NAUEA OR  VOMITING   pantoprazole (PROTONIX) 40 MG tablet TAKE 1 TABLET BY MOUTH  TWICE DAILY BEFORE MEALS   promethazine (PHENERGAN) 25 MG tablet  Take 1 tablet (25 mg total) by mouth 2 (two) times daily as needed for nausea or vomiting.   REPATHA SURECLICK 704 MG/ML SOAJ INJECT 140MG  SUBCUTANEOUSLY EVERY 2 WEEKS   traMADol (ULTRAM) 50 MG tablet Take 1 tablet (50 mg total) by mouth every 6 (six) hours as needed for moderate pain.   traZODone (DESYREL) 150 MG tablet TAKE 1 TABLET BY MOUTH AT  BEDTIME AS NEEDED FOR SLEEP    Allergies: Ativan [lorazepam], Augmentin [amoxicillin-pot clavulanate], Lactose intolerance (gi), Latex, Tape, Xifaxan [rifaximin], and Drixoral allergy sinus [dexbromphen-pse-apap er]  Social History   Tobacco Use   Smoking status: Former    Packs/day: 1.00    Years: 30.00    Pack years: 30.00    Types: Cigarettes    Quit date: 12/31/2020    Years since quitting: 0.6   Smokeless tobacco: Never   Tobacco comments:    Quit after being admitted on 6/20 01/17/2021  Vaping Use   Vaping Use: Former  Substance Use Topics   Alcohol use: Yes    Comment: once a year   Drug use: Not Currently    Frequency: 7.0 times per week    Types: Marijuana, Cocaine    Comment: 1 per day    Family History  Problem Relation Age of Onset   CAD Mother    Depression Mother    Heart disease Mother    Hyperlipidemia Mother    Hypertension Mother    Heart disease Father    Alcohol abuse Father    Cancer Brother        brain   CAD Brother    Depression Brother    Diabetes Brother    Heart disease Brother    Hyperlipidemia Brother    Lupus Other    Sickle cell anemia Other     Review of Systems: A 12-system review of systems was performed and was negative except as noted in the HPI.  --------------------------------------------------------------------------------------------------  Physical Exam: BP 120/82 (BP Location: Left Arm, Patient Position: Sitting, Cuff Size: Normal)    Pulse 82    Ht 5\' 3"  (1.6 m)    Wt 179 lb (81.2 kg)    SpO2 98%    BMI 31.71 kg/m   General:  NAD. Neck: No JVD or HJR. Lungs: Clear to  auscultation bilaterally without wheezes or crackles. Heart: Regular rate and rhythm without murmurs, rubs, or gallops. Abdomen: Soft, nontender, nondistended. Extremities: No lower extremity edema.  Lab Results  Component Value Date   WBC 8.4 04/24/2021   HGB 12.7 04/24/2021   HCT 38.1 04/24/2021   MCV 87.8 04/24/2021   PLT 399 04/24/2021    Lab Results  Component Value Date   NA 138 04/24/2021   K 3.8 04/24/2021   CL 99 04/24/2021   CO2 29 04/24/2021   BUN 9 04/24/2021   CREATININE 0.66 04/24/2021   GLUCOSE 108 (H) 04/24/2021   ALT 43 04/24/2021    Lab Results  Component Value Date   CHOL 158 12/31/2020   HDL 42 12/31/2020   LDLCALC 72 12/31/2020   LDLDIRECT 148.0 (H) 08/30/2020   TRIG 275 (H) 12/31/2020   CHOLHDL 3.8 12/31/2020    --------------------------------------------------------------------------------------------------  ASSESSMENT AND PLAN: Coronary artery disease with stable angina: Chest pain has been fairly quiesced sent with escalation of isosorbide mononitrate to 90 mg daily.  I am somewhat worried that this medication could be contributing to her headaches, though headaches are only intermittently present despite daily use of the medication.  We have agreed to continue her current regimen for antianginal therapy, including amlodipine and isosorbide mononitrate.  Continue aggressive lipid therapy as well to prevent progression of disease.  Hyperlipidemia: Ms. Dusseau is on aggressive lipid therapy with atorvastatin, ezetimibe, and evolocumab.  We will check a lipid panel and CMP today.  If LDL is well controlled, I would favor discontinuation of ezetimibe and continuation of atorvastatin and evolocumab.  Hypertension: Blood pressure well controlled today, though the patient notes somewhat labile blood pressures at home associated with headaches.  It is unclear to me if the headaches are driving increases in her blood pressure or if the headaches are a  result of uncontrolled hypertension.  It is unclear to me exactly what medications/doses misconceptions he on is taking at home.  I have asked her to message [place] with her current medications, specifically olmesartan-HCTZ, so that we can make further recommendations.  Claudication: Ms. Bessire reports leg cramps when walking for 30 minutes.  We will  arrange for ABIs at her convenience.  Follow-up: Return to clinic in 6 months.  Nelva Bush, MD 08/21/2021 9:37 AM

## 2021-08-21 ENCOUNTER — Other Ambulatory Visit: Payer: Self-pay | Admitting: *Deleted

## 2021-08-21 ENCOUNTER — Encounter: Payer: Self-pay | Admitting: Internal Medicine

## 2021-08-21 ENCOUNTER — Ambulatory Visit (INDEPENDENT_AMBULATORY_CARE_PROVIDER_SITE_OTHER): Payer: Medicare Other | Admitting: Internal Medicine

## 2021-08-21 ENCOUNTER — Other Ambulatory Visit: Payer: Self-pay

## 2021-08-21 VITALS — BP 120/82 | HR 82 | Ht 63.0 in | Wt 179.0 lb

## 2021-08-21 DIAGNOSIS — I739 Peripheral vascular disease, unspecified: Secondary | ICD-10-CM | POA: Insufficient documentation

## 2021-08-21 DIAGNOSIS — E782 Mixed hyperlipidemia: Secondary | ICD-10-CM

## 2021-08-21 DIAGNOSIS — E785 Hyperlipidemia, unspecified: Secondary | ICD-10-CM | POA: Diagnosis not present

## 2021-08-21 DIAGNOSIS — D75839 Thrombocytosis, unspecified: Secondary | ICD-10-CM

## 2021-08-21 DIAGNOSIS — I10 Essential (primary) hypertension: Secondary | ICD-10-CM

## 2021-08-21 DIAGNOSIS — D729 Disorder of white blood cells, unspecified: Secondary | ICD-10-CM

## 2021-08-21 DIAGNOSIS — R233 Spontaneous ecchymoses: Secondary | ICD-10-CM

## 2021-08-21 DIAGNOSIS — I25118 Atherosclerotic heart disease of native coronary artery with other forms of angina pectoris: Secondary | ICD-10-CM

## 2021-08-21 DIAGNOSIS — D72829 Elevated white blood cell count, unspecified: Secondary | ICD-10-CM

## 2021-08-21 NOTE — Patient Instructions (Signed)
Medication Instructions:   Your physician recommends that you continue on your current medications as directed. Please refer to the Current Medication list given to you today.  *If you need a refill on your cardiac medications before your next appointment, please call your pharmacy*   Lab Work:  Today: Lipid panel, CMET  If you have labs (blood work) drawn today and your tests are completely normal, you will receive your results only by: [place] (if you have MyChart) OR A paper copy in the mail If you have any lab test that is abnormal or we need to change your treatment, we will call you to review the results.   Testing/Procedures:  None ordered   Follow-Up: At [facility], you and your health needs are our priority.  As part of our continuing mission to provide you with exceptional heart care, we have created designated Provider Care Teams.  These Care Teams include your primary Cardiologist (physician) and Advanced Practice Providers (APPs -  Physician Assistants and Nurse Practitioners) who all work together to provide you with the care you need, when you need it.  We recommend signing up for the patient portal called "MyChart".  Sign up information is provided on this After Visit Summary.  MyChart is used to connect with patients for Virtual Visits (Telemedicine).  Patients are able to view lab/test results, encounter notes, upcoming appointments, etc.  Non-urgent messages can be sent to your provider as well.   To learn more about what you can do with MyChart, go to [email or web address].    Your next appointment:   6 month(s)  The format for your next appointment:   In Person  Provider:   You may see Nelva Bush, MD or one of the following Advanced Practice Providers on your designated Care Team:   Murray Hodgkins, NP Christell Faith, PA-C Cadence Kathlen Mody, [place]   Other Instructions  Please call our office when you are home to verify your medications that  you are currently taking.

## 2021-08-21 NOTE — Telephone Encounter (Signed)
Called and spoke with pt to clarify her mychart message.   Pt was instructed previously to cut Olmesartan-HCTZ 40-25 mg in HALF.  So pt is taking Olmesartan-HCTZ 20-12.5 mg daily unless her BP is 130/80 or greater, then may take a whole tablet (40-25 mg).  Med list updated. Will make Dr. Saunders Revel aware of update.

## 2021-08-22 LAB — COMPREHENSIVE METABOLIC PANEL
ALT: 58 IU/L — ABNORMAL HIGH (ref 0–32)
AST: 41 IU/L — ABNORMAL HIGH (ref 0–40)
Albumin/Globulin Ratio: 2.3 — ABNORMAL HIGH (ref 1.2–2.2)
Albumin: 4.6 g/dL (ref 3.8–4.9)
Alkaline Phosphatase: 116 IU/L (ref 44–121)
BUN/Creatinine Ratio: 25 — ABNORMAL HIGH (ref 9–23)
BUN: 19 mg/dL (ref 6–24)
Bilirubin Total: 0.2 mg/dL (ref 0.0–1.2)
CO2: 21 mmol/L (ref 20–29)
Calcium: 9.5 mg/dL (ref 8.7–10.2)
Chloride: 101 mmol/L (ref 96–106)
Creatinine, Ser: 0.76 mg/dL (ref 0.57–1.00)
Globulin, Total: 2 g/dL (ref 1.5–4.5)
Glucose: 109 mg/dL — ABNORMAL HIGH (ref 70–99)
Potassium: 4.6 mmol/L (ref 3.5–5.2)
Sodium: 139 mmol/L (ref 134–144)
Total Protein: 6.6 g/dL (ref 6.0–8.5)
eGFR: 93 mL/min/{1.73_m2} (ref >59)

## 2021-08-22 LAB — LIPID PANEL
Chol/HDL Ratio: 2.7 ratio (ref 0.0–4.4)
Cholesterol, Total: 178 mg/dL (ref 100–199)
HDL: 67 mg/dL
LDL Chol Calc (NIH): 73 mg/dL (ref 0–99)
Triglycerides: 239 mg/dL — ABNORMAL HIGH (ref 0–149)
VLDL Cholesterol Cal: 38 mg/dL (ref 5–40)

## 2021-08-23 ENCOUNTER — Telehealth: Payer: Self-pay | Admitting: *Deleted

## 2021-08-23 DIAGNOSIS — I739 Peripheral vascular disease, unspecified: Secondary | ICD-10-CM

## 2021-08-23 NOTE — Telephone Encounter (Signed)
-----   Message from Nelva Bush, MD sent at 08/22/2021  4:46 PM EST ----- Lipids still suboptimally controlled.  Mild transaminitis noted.  Recommend continuing current medications.  Given that patient complained of leg pain with ambulation yesterday, I recommend that we obtain ABI's at her convenience.

## 2021-08-23 NOTE — Telephone Encounter (Signed)
Lmov to schedule .  Order in scheduling wq .  Closing this encounter.

## 2021-08-23 NOTE — Telephone Encounter (Signed)
Called and spoke to pt.  Notified of lab results and Dr. Darnelle Bos recc.  Pt voiced understanding.  Pt will continue current medications.  Pt aware scheduling will contact her to schedule ABI's.  Orders placed and forwarded to scheduling to set up appointment.  Pt has no further questions at this time.

## 2021-08-27 ENCOUNTER — Inpatient Hospital Stay (HOSPITAL_BASED_OUTPATIENT_CLINIC_OR_DEPARTMENT_OTHER): Payer: Medicare Other | Admitting: Oncology

## 2021-08-27 ENCOUNTER — Inpatient Hospital Stay: Payer: Medicare Other

## 2021-08-27 ENCOUNTER — Inpatient Hospital Stay: Payer: Medicare Other | Attending: Nurse Practitioner

## 2021-08-27 ENCOUNTER — Inpatient Hospital Stay: Payer: Medicare Other | Admitting: Nurse Practitioner

## 2021-08-27 ENCOUNTER — Encounter: Payer: Self-pay | Admitting: Oncology

## 2021-08-27 ENCOUNTER — Other Ambulatory Visit: Payer: Self-pay

## 2021-08-27 VITALS — BP 124/86 | HR 84 | Temp 98.6°F | Resp 16 | Ht 63.0 in | Wt 179.2 lb

## 2021-08-27 DIAGNOSIS — Z79899 Other long term (current) drug therapy: Secondary | ICD-10-CM | POA: Insufficient documentation

## 2021-08-27 DIAGNOSIS — G4733 Obstructive sleep apnea (adult) (pediatric): Secondary | ICD-10-CM | POA: Insufficient documentation

## 2021-08-27 DIAGNOSIS — Z7951 Long term (current) use of inhaled steroids: Secondary | ICD-10-CM | POA: Insufficient documentation

## 2021-08-27 DIAGNOSIS — D729 Disorder of white blood cells, unspecified: Secondary | ICD-10-CM

## 2021-08-27 DIAGNOSIS — D72829 Elevated white blood cell count, unspecified: Secondary | ICD-10-CM | POA: Diagnosis not present

## 2021-08-27 DIAGNOSIS — D75839 Thrombocytosis, unspecified: Secondary | ICD-10-CM | POA: Insufficient documentation

## 2021-08-27 DIAGNOSIS — R233 Spontaneous ecchymoses: Secondary | ICD-10-CM

## 2021-08-27 DIAGNOSIS — Z7982 Long term (current) use of aspirin: Secondary | ICD-10-CM | POA: Insufficient documentation

## 2021-08-27 DIAGNOSIS — Z7902 Long term (current) use of antithrombotics/antiplatelets: Secondary | ICD-10-CM | POA: Diagnosis not present

## 2021-08-27 LAB — CBC WITH DIFFERENTIAL/PLATELET
Abs Immature Granulocytes: 0.06 10*3/uL (ref 0.00–0.07)
Basophils Absolute: 0.1 10*3/uL (ref 0.0–0.1)
Basophils Relative: 1 %
Eosinophils Absolute: 0.2 10*3/uL (ref 0.0–0.5)
Eosinophils Relative: 2 %
HCT: 42.4 % (ref 36.0–46.0)
Hemoglobin: 14.1 g/dL (ref 12.0–15.0)
Immature Granulocytes: 1 %
Lymphocytes Relative: 36 %
Lymphs Abs: 3.3 10*3/uL (ref 0.7–4.0)
MCH: 29.1 pg (ref 26.0–34.0)
MCHC: 33.3 g/dL (ref 30.0–36.0)
MCV: 87.6 fL (ref 80.0–100.0)
Monocytes Absolute: 0.7 10*3/uL (ref 0.1–1.0)
Monocytes Relative: 8 %
Neutro Abs: 4.9 10*3/uL (ref 1.7–7.7)
Neutrophils Relative %: 52 %
Platelets: 475 10*3/uL — ABNORMAL HIGH (ref 150–400)
RBC: 4.84 MIL/uL (ref 3.87–5.11)
RDW: 13.2 % (ref 11.5–15.5)
WBC: 9.3 10*3/uL (ref 4.0–10.5)
nRBC: 0 % (ref 0.0–0.2)

## 2021-08-27 NOTE — Telephone Encounter (Signed)
Appt scheduled 09/26/21.

## 2021-08-27 NOTE — Progress Notes (Signed)
Pt states she  and has gastroparesis and has nausea a lot but none today. She also says that she has trouble sleeping and has cpap and does not use it because it causes problems. Also she had HA today but it is not as bad as it ws for the last 4 days, has an appt for PCP tom. She is also worried that she might need to be screened for cancer because she has found out that her father's side that she is just getting to know has cancer from being on face book but does not know what cancers it is.

## 2021-08-28 ENCOUNTER — Other Ambulatory Visit: Payer: Self-pay

## 2021-08-28 ENCOUNTER — Telehealth: Payer: Self-pay | Admitting: Family

## 2021-08-28 ENCOUNTER — Ambulatory Visit (INDEPENDENT_AMBULATORY_CARE_PROVIDER_SITE_OTHER): Payer: Medicare Other | Admitting: Family

## 2021-08-28 ENCOUNTER — Encounter: Payer: Self-pay | Admitting: Family

## 2021-08-28 VITALS — BP 122/68 | HR 84 | Temp 98.7°F | Ht 63.0 in | Wt 179.2 lb

## 2021-08-28 DIAGNOSIS — G4733 Obstructive sleep apnea (adult) (pediatric): Secondary | ICD-10-CM | POA: Diagnosis not present

## 2021-08-28 DIAGNOSIS — R519 Headache, unspecified: Secondary | ICD-10-CM | POA: Insufficient documentation

## 2021-08-28 DIAGNOSIS — I10 Essential (primary) hypertension: Secondary | ICD-10-CM | POA: Diagnosis not present

## 2021-08-28 MED ORDER — TOPIRAMATE 25 MG PO TABS: 25.0000 mg | ORAL_TABLET | Freq: Every day | ORAL | 0 refills | Status: DC

## 2021-08-28 NOTE — Telephone Encounter (Signed)
FYI Dr Patsey Berthold below

## 2021-08-28 NOTE — Assessment & Plan Note (Signed)
Chronic, well controlled today.  Continue amlodipine 10 mg, isosorbide mononitrate 30 mg.  She also takes olmesartan hydrochlorothiazide half tablet if blood pressure is less than 130.  She will take a full tablet of 40-25 mg if blood pressures greater than 130.  I have not made any medication changes to blood pressure regimen today.

## 2021-08-28 NOTE — Assessment & Plan Note (Signed)
Acute.  Reassuring neurologic exam today. based on severity pulsatile sensation, I ordered MRI and MRA brain.  I placed this order as stat.  Discussed my concern for etiology of headache likely being for medication overuse (Tylenol, NSAIDs), untreated sleep apnea.   will start her on Topamax 25 mg nightly and increase in 1 week's time to 50 mg nightly if headache has not improved.  Advised her to limit, if not completely abstain from ibuprofen or Tylenol at this time.  Reviewed cardiology note and agree  that isosorbide mononitrate could be potentially exacerbating headache however I elected not to change any hypertensive medications at this time based on labile blood pressures.  I suspect medication overuse, untreated sleep apnea more likely to be causing headache at this time.  Close follow-up in 2 weeks

## 2021-08-28 NOTE — Patient Instructions (Signed)
Start topamax 25 at bedtime.  In 1 week's time he may increase Topamax from 25 mg taken at bedtime to 50 mg (2 tablets) taken at bedtime if headaches have not improved to resolved.    As discussed, limit to stop use of ibuprofen and Tylenol use as I think this is causing rebound or medication overuse headache.  Particularly with ibuprofen, this can raise your blood pressure.  I have a call out to Dr. Patsey Berthold, pulmonology to evaluate you again for sleep apnea is also think this is contributing to headache and labile blood pressures.  Please let me know how you are doing

## 2021-08-28 NOTE — Telephone Encounter (Signed)
She has been poor to follow-up.  Her main issues are likely related to OSA.  We will schedule her with one of our sleep providers.

## 2021-08-28 NOTE — Assessment & Plan Note (Signed)
Diagnosed years ago however she has not worn her CPAP machine for years.  Counseled her on the risk of untreated sleep apnea as it relates to fatigue, headache, labile blood pressure, and risk of stroke and heart attack.  She is very aware of this.  I sent a call note to pulmonology, Dr. Patsey Berthold to schedule follow-up to discuss OSA

## 2021-08-28 NOTE — Progress Notes (Signed)
Subjective:    Patient ID: Shawna Anderson, female    DOB: 1965-07-31, 56 y.o.   MRN: [id number]  CC: Shawna Anderson is a 56 y.o. female who presents today for an acute visit.    HPI: Acute visit for headache She describes a throbbing, "pulsing" headache on the top of her head, behind both eyes and also the back of her neck.  Headache is daily.  She is a "light version" of headache today.  Headache tends to follow a pattern but is lighter in the morning and will worsen as the day goes on.  She reports a remote history of migraines years ago when she was young.  She has not had any recent migraines.  Endorses photophobia.  Headache is not positional nor exertional.  The headache does awaken her from sleep.  She denies fever, nausea, vomiting, cough, chest pain, shortness of breath, vision loss, or numbness of the face.  She is taking 800 mg ibuprofen every 4-5 hours.  She also takes Tylenol most days.  Blood pressures remain labile at home.  She does report elevated blood pressures however she is taking blood pressure and she also has a headache.  She reports a recent eye exam she is up-to-date patient is a new prescription of her glasses.  She has a history of sleep apnea and is not wearing her CPAP.  She has not worn CPAP for many years. No family history of aneurysm.  Her brother has a history of brain cancer  Follow-up with Dr. Saunders Revel, cardiology 08/21/2021 for CAD, headache, hypertension and labile blood pressures.  She is compliant with amlodipine 10 mg, isosorbide mononitrate 30 mg.  She also takes olmesartan hydrochlorothiazide half tablet if blood pressure is less than 130.  She will take a full tablet of 40-25 mg if blood pressures greater than 130.    HISTORY:  Past Medical History:  Diagnosis Date   Anxiety    Asthma    Bipolar 1 disorder ([place])    Bipolar disorder ([place])    CAD (coronary artery disease)    s/p stent BMS OM Cx   Cervical herniated disc 04/12/2016   COPD  (chronic obstructive pulmonary disease) (HCC)    Depression    Diabetes mellitus with gastroparesis (HCC)    Diabetes mellitus without complication (HCC)    Diverticulitis    GERD (gastroesophageal reflux disease)    Glaucoma    History of blood transfusion    Hyperlipidemia    Hypertension    OSA (obstructive sleep apnea)    not using cpap    Plantar fasciitis    b/l feet s/p steroid shots w/o help and left surgery w/o help    UTI (urinary tract infection)    Past Surgical History:  Procedure Laterality Date   ABDOMINAL HYSTERECTOMY     ABDOMINAL SURGERY  1995   Bowel resection.   CARDIAC CATHETERIZATION N/A 04/14/2016   Procedure: Left Heart Cath and Coronary Angiography;  Surgeon: Burnell Blanks, MD;  Location: [facility];  Service: Cardiovascular;  Laterality: N/A;   CARDIAC SURGERY     COLONOSCOPY WITH PROPOFOL N/A 07/11/2019   Procedure: COLONOSCOPY WITH PROPOFOL;  Surgeon: Jonathon Bellows, MD;  Location: [facility] ENDOSCOPY;  Service: Gastroenterology;  Laterality: N/A;   COLONOSCOPY WITH PROPOFOL N/A 07/29/2019   Procedure: COLONOSCOPY WITH PROPOFOL;  Surgeon: Jonathon Bellows, MD;  Location: [facility] ENDOSCOPY;  Service: Gastroenterology;  Laterality: N/A;   CORONARY ANGIOPLASTY WITH STENT PLACEMENT  2010  Drug eluting stent   ESOPHAGOGASTRODUODENOSCOPY (EGD) WITH PROPOFOL N/A 07/11/2019   Procedure: ESOPHAGOGASTRODUODENOSCOPY (EGD) WITH PROPOFOL;  Surgeon: Jonathon Bellows, MD;  Location: [facility]COPY;  Service: Gastroenterology;  Laterality: N/A;   LEFT HEART CATH AND CORONARY ANGIOGRAPHY Left 09/11/2020   Procedure: LEFT HEART CATH AND CORONARY ANGIOGRAPHY;  Surgeon: Nelva Bush, MD;  Location: [place] [facility];  Service: Cardiovascular;  Laterality: Left;   OVARIAN CYST REMOVAL     Family History  Problem Relation Age of Onset   CAD Mother    Depression Mother    Heart disease Mother    Hyperlipidemia Mother    Hypertension Mother    Heart disease Father     Alcohol abuse Father    Cancer Brother 21       brain   CAD Brother    Depression Brother    Diabetes Brother    Heart disease Brother    Hyperlipidemia Brother    Lupus Other    Sickle cell anemia Other     Allergies: Ativan [lorazepam], Augmentin [amoxicillin-pot clavulanate], Lactose intolerance (gi), Latex, Tape, Xifaxan [rifaximin], and Drixoral allergy sinus [dexbromphen-pse-apap er] Current Outpatient Medications on File Prior to Visit  Medication Sig Dispense Refill   albuterol (PROAIR HFA) 108 (90 Base) MCG/ACT inhaler Inhale 1-2 puffs into the lungs every 4 (four) hours as needed for wheezing or shortness of breath. 54 g 3   amLODipine (NORVASC) 10 MG tablet TAKE 1 TABLET BY MOUTH AT  BEDTIME 90 tablet 3   aspirin 81 MG chewable tablet Chew 81 mg by mouth daily.     atorvastatin (LIPITOR) 80 MG tablet TAKE 1 TABLET BY MOUTH  DAILY AT 6 PM. 90 tablet 3   Budeson-Glycopyrrol-Formoterol (BREZTRI AEROSPHERE) 160-9-4.8 MCG/ACT AERO Inhale 2 puffs into the lungs in the morning and at bedtime. Rinse 32.1 g 3   clopidogrel (PLAVIX) 75 MG tablet TAKE 1 TABLET BY MOUTH  DAILY 90 tablet 3   divalproex (DEPAKOTE) 500 MG DR tablet TAKE 1 TABLET BY MOUTH  TWICE DAILY 180 tablet 3   ezetimibe (ZETIA) 10 MG tablet TAKE 1 TABLET BY MOUTH  DAILY WITH LIPITOR 80 MG AT NIGHT 90 tablet 3   isosorbide mononitrate (IMDUR) 30 MG 24 hr tablet Take 3 tablets (90 mg) by mouth once daily 90 tablet 6   methocarbamol (ROBAXIN) 500 MG tablet Take 1 tablet (500 mg total) by mouth 2 (two) times daily as needed for muscle spasms. 180 tablet 1   metoCLOPramide (REGLAN) 5 MG tablet TAKE 1 TABLET BY MOUTH 4  TIMES DAILY BEFORE MEALS  AND AT BEDTIME 360 tablet 2   mometasone (ELOCON) 0.1 % cream Apply 1 application topically as directed. Qhs 5 days a week Monday through Friday 45 g 2   nitroGLYCERIN (NITROSTAT) 0.4 MG SL tablet DISSOLVE 1 TABLET UNDER  TONGUE EVERY 5 MINUTES AS  NEEDED FOR CHEST PAIN MAX  OF 3  TABLETS IN 15 MINUTES  . CALL 911 AFTER THIRD DOSE 75 tablet 4   olmesartan-hydrochlorothiazide (BENICAR HCT) 40-25 MG tablet Take 0.5 tablets by mouth daily. If BP 130/80 or greater, take 1 tablet (40-25 mg)     ondansetron (ZOFRAN-ODT) 4 MG disintegrating tablet DISSOLVE 1 TABLET ON THE  TONGUE EVERY 8 HOURS AS  NEEDED FOR NAUEA OR  VOMITING 270 tablet 1   pantoprazole (PROTONIX) 40 MG tablet TAKE 1 TABLET BY MOUTH  TWICE DAILY BEFORE MEALS 180 tablet 3   promethazine (PHENERGAN) 25 MG tablet Take  1 tablet (25 mg total) by mouth 2 (two) times daily as needed for nausea or vomiting. 20 tablet 0   REPATHA SURECLICK 151 MG/ML SOAJ INJECT 140MG  SUBCUTANEOUSLY EVERY 2 WEEKS 6 mL 0   traZODone (DESYREL) 150 MG tablet TAKE 1 TABLET BY MOUTH AT  BEDTIME AS NEEDED FOR SLEEP 90 tablet 3   Current Facility-Administered Medications on File Prior to Visit  Medication Dose Route Frequency Provider Last Rate Last Admin   albuterol (PROVENTIL) (2.5 MG/3ML) 0.083% nebulizer solution 2.5 mg  2.5 mg Nebulization Once Tyler Pita, MD        Social History   Tobacco Use   Smoking status: Former    Packs/day: 1.00    Years: 30.00    Pack years: 30.00    Types: Cigarettes    Quit date: 12/31/2020    Years since quitting: 0.6   Smokeless tobacco: Never   Tobacco comments:    Quit after being admitted on 6/20 01/17/2021  Vaping Use   Vaping Use: Former  Substance Use Topics   Alcohol use: Yes    Comment: once a year   Drug use: Not Currently    Types: Marijuana, Cocaine    Review of Systems  Constitutional:  Negative for chills and fever.  Eyes:  Positive for photophobia. Negative for visual disturbance.  Respiratory:  Negative for cough.   Cardiovascular:  Negative for chest pain and palpitations.  Gastrointestinal:  Negative for nausea and vomiting.  Neurological:  Positive for headaches. Negative for numbness.     Objective:    BP 122/68 (BP Location: Left Arm, Patient Position: Sitting,  Cuff Size: Large)    Pulse 84    Temp 98.7 F (37.1 C) (Oral)    Ht 5\' 3"  (1.6 m)    Wt 179 lb 3.2 oz (81.3 kg)    SpO2 98%    BMI 31.74 kg/m    Physical Exam Vitals reviewed.  Constitutional:      Appearance: She is well-developed.  HENT:     Mouth/Throat:     Pharynx: Uvula midline.  Eyes:     Conjunctiva/sclera: Conjunctivae normal.     Pupils: Pupils are equal, round, and reactive to light.     Comments: Fundus normal bilaterally.   Cardiovascular:     Rate and Rhythm: Normal rate and regular rhythm.     Pulses: Normal pulses.     Heart sounds: Normal heart sounds.  Pulmonary:     Effort: Pulmonary effort is normal.     Breath sounds: Normal breath sounds. No wheezing, rhonchi or rales.  Skin:    General: Skin is warm and dry.  Neurological:     Mental Status: She is alert.     Cranial Nerves: No cranial nerve deficit.     Sensory: No sensory deficit.     Deep Tendon Reflexes:     Reflex Scores:      Bicep reflexes are 2+ on the right side and 2+ on the left side.      Patellar reflexes are 2+ on the right side and 2+ on the left side.    Comments: Grip equal and strong bilateral upper extremities. Gait strong and steady. Able to perform rapid alternating movement without difficulty.   Psychiatric:        Speech: Speech normal.        Behavior: Behavior normal.        Thought Content: Thought content normal.       Assessment &  Plan:   Problem List Items Addressed This Visit       Cardiovascular and Mediastinum   Essential hypertension    Chronic, well controlled today.  Continue amlodipine 10 mg, isosorbide mononitrate 30 mg.  She also takes olmesartan hydrochlorothiazide half tablet if blood pressure is less than 130.  She will take a full tablet of 40-25 mg if blood pressures greater than 130.  I have not made any medication changes to blood pressure regimen today.          Respiratory   OSA (obstructive sleep apnea)    Diagnosed years ago however she has  not worn her CPAP machine for years.  Counseled her on the risk of untreated sleep apnea as it relates to fatigue, headache, labile blood pressure, and risk of stroke and heart attack.  She is very aware of this.  I sent a call note to pulmonology, Dr. Patsey Berthold to schedule follow-up to discuss OSA         Other   Headache - Primary    Acute.  Reassuring neurologic exam today. based on severity pulsatile sensation, I ordered MRI and MRA brain.  I placed this order as stat.  Discussed my concern for etiology of headache likely being for medication overuse (Tylenol, NSAIDs), untreated sleep apnea.   will start her on Topamax 25 mg nightly and increase in 1 week's time to 50 mg nightly if headache has not improved.  Advised her to limit, if not completely abstain from ibuprofen or Tylenol at this time.  Reviewed cardiology note and agree  that isosorbide mononitrate could be potentially exacerbating headache however I elected not to change any hypertensive medications at this time based on labile blood pressures.  I suspect medication overuse, untreated sleep apnea more likely to be causing headache at this time.  Close follow-up in 2 weeks      Relevant Medications   topiramate (TOPAMAX) 25 MG tablet   Other Relevant Orders   MR BRAIN W WO CONTRAST   MR ANGIO HEAD WO CONTRAST      I am having Shawna Anderson start on topiramate. I am also having her maintain her aspirin, ezetimibe, nitroGLYCERIN, albuterol, methocarbamol, traZODone, amLODipine, isosorbide mononitrate, clopidogrel, atorvastatin, ondansetron, pantoprazole, metoCLOPramide, promethazine, Breztri Aerosphere, mometasone, Repatha SureClick, divalproex, and olmesartan-hydrochlorothiazide.   Meds ordered this encounter  Medications   topiramate (TOPAMAX) 25 MG tablet    Sig: Take 1 tablet (25 mg total) by mouth at bedtime.    Dispense:  90 tablet    Refill:  0    Order Specific Question:   Supervising Provider    Answer:    Crecencio Mc [[id number]]    Return precautions given.   Risks, benefits, and alternatives of the medications and treatment plan prescribed today were discussed, and patient expressed understanding.   Education regarding symptom management and diagnosis given to patient on AVS.  Continue to follow with McLean-Scocuzza, Nino Glow, MD for routine health maintenance.   [employer] and I agreed with plan.   Mable Paris, FNP

## 2021-08-28 NOTE — Telephone Encounter (Signed)
FYI Dr Aundra Dubin and Dr End  Dr Patsey Berthold,   Would you mind having your office reach out to patient to schedule follow-up particularly to discuss evaluation for sleep apnea?   She was diagnosed years ago however has not been compliant with her CPAP.  I suspect she is due for a new sleep study.  I had long discussion with her today that I think labile blood pressures, headache may be related to sleep apnea. I emphasized the importance of being treated for sleep apnea.   I am starting her on Topamax for prevention and ordering neuroimaging.

## 2021-08-28 NOTE — Telephone Encounter (Signed)
Appt scheduled 09/27/2021 at 11:30. Patient is aware and voiced her understanding.  Nothing further needed.

## 2021-08-29 ENCOUNTER — Ambulatory Visit
Admission: RE | Admit: 2021-08-29 | Discharge: 2021-08-29 | Disposition: A | Discharge status: Home / Self Care | Payer: Medicare Other | Source: Ambulatory Visit | Attending: Family | Admitting: Family

## 2021-08-29 DIAGNOSIS — R519 Headache, unspecified: Secondary | ICD-10-CM | POA: Diagnosis not present

## 2021-08-29 MED ORDER — GADOBUTROL 1 MMOL/ML IV SOLN
8.0000 mL | Freq: Once | INTRAVENOUS | Status: AC | PRN
  Administered 2021-08-29: 8 mL via INTRAVENOUS

## 2021-08-29 NOTE — Progress Notes (Signed)
Hematology/Oncology Consult note [facility]  [phone] [phone]  Patient Care Team: McLean-Scocuzza, Nino Glow, MD as PCP - General (Internal Medicine) End, Harrell Gave, MD as PCP - Cardiology (Cardiology) Sindy Guadeloupe, MD as Consulting Physician (Oncology)   Name of the patient: Shawna Anderson  [id number]  02/18/1966   Date of visit: 08/29/21  Diagnosis-intermittent leukocytosis likely reactive  Chief complaint/ Reason for visit-routine follow-up of leukocytosis and thrombocytosis  Heme/Onc history:  patient is a 56 year old female with a past medical history significant for a s/p stent placement, COPD, GERD referred to me for leukocytosis/thrombocytosis.Looking back at her CBCs patient has had chronic leukocytosis at least dating back to 2016 when her white cell count fluctuates widely between 11-20.  Differential has mostly show neutrophilia but at times has shown lymphocytosis and monocytosis.  Hemoglobin is always been normal but most recently patient was noted to have thrombocytosis as well.  Her most recent CBC from 07/18/2019 showed white cell count of 17, H&H of 13.7/40.8 and a platelet count of 550.  Patient admits to smoking daily about half a pack per day.  Also admits to smoking marijuana occasionally.    Further work-up in March 2021 including flow cytometry did not reveal any immunophenotypic abnormality.  JAK2, CAL R, MPL testing was negative.  BCR ABL FISH testing was negative.  HIV hepatitis B hepatitis C testing negative.  With regards to her easy bruising she underwent PT PTT INR, fibrinogen levels which were normal.  Von Willebrand panel was normal except for mildly elevated factor VIII level of 152%.  Thrombin time was normal.  PFA was not done as patient is on Plavix.      Interval history-patient still reports ongoing bruising especially over her forearms.  She also has ongoing fatigue and exertional shortness of  breath.  She follows up with Dr. Patsey Berthold for her obstructive sleep apnea.  ECOG PS- 1 Pain scale- 0   Review of systems- Review of Systems  Constitutional:  Positive for malaise/fatigue. Negative for chills, fever and weight loss.  HENT:  Negative for congestion, ear discharge and nosebleeds.   Eyes:  Negative for blurred vision.  Respiratory:  Positive for shortness of breath. Negative for cough, hemoptysis, sputum production and wheezing.   Cardiovascular:  Negative for chest pain, palpitations, orthopnea and claudication.  Gastrointestinal:  Negative for abdominal pain, blood in stool, constipation, diarrhea, heartburn, melena, nausea and vomiting.  Genitourinary:  Negative for dysuria, flank pain, frequency, hematuria and urgency.  Musculoskeletal:  Negative for back pain, joint pain and myalgias.  Skin:  Negative for rash.  Neurological:  Negative for dizziness, tingling, focal weakness, seizures, weakness and headaches.  Endo/Heme/Allergies:  Does not bruise/bleed easily.  Psychiatric/Behavioral:  Negative for depression and suicidal ideas. The patient does not have insomnia.      Allergies  Allergen Reactions   Ativan [Lorazepam]     Pt states it makes her tongue do weird things    Augmentin [Amoxicillin-Pot Clavulanate]     Upset stomach    Lactose Intolerance (Gi)    Latex Other (See Comments)    Patient stated that she was told by her doctor that she is "allergic to" this   Tape Other (See Comments)    Patient stated that she was told by her doctor that she is "allergic to" this   Xifaxan [Rifaximin]     Pt believes this is contributing to her abdominal pain/discomfort   Drixoral Allergy Sinus [Dexbromphen-Pse-Apap Er]  Rash     Past Medical History:  Diagnosis Date   Anxiety    Asthma    Bipolar 1 disorder ([place])    Bipolar disorder ([place])    CAD (coronary artery disease)    s/p stent BMS OM Cx   Cervical herniated disc 04/12/2016   COPD (chronic obstructive  pulmonary disease) (HCC)    Depression    Diabetes mellitus with gastroparesis (HCC)    Diabetes mellitus without complication (HCC)    Diverticulitis    GERD (gastroesophageal reflux disease)    Glaucoma    History of blood transfusion    Hyperlipidemia    Hypertension    OSA (obstructive sleep apnea)    not using cpap    Plantar fasciitis    b/l feet s/p steroid shots w/o help and left surgery w/o help    UTI (urinary tract infection)      Past Surgical History:  Procedure Laterality Date   ABDOMINAL HYSTERECTOMY     ABDOMINAL SURGERY  1995   Bowel resection.   CARDIAC CATHETERIZATION N/A 04/14/2016   Procedure: Left Heart Cath and Coronary Angiography;  Surgeon: Burnell Blanks, MD;  Location: [facility];  Service: Cardiovascular;  Laterality: N/A;   CARDIAC SURGERY     COLONOSCOPY WITH PROPOFOL N/A 07/11/2019   Procedure: COLONOSCOPY WITH PROPOFOL;  Surgeon: Jonathon Bellows, MD;  Location: [facility] ENDOSCOPY;  Service: Gastroenterology;  Laterality: N/A;   COLONOSCOPY WITH PROPOFOL N/A 07/29/2019   Procedure: COLONOSCOPY WITH PROPOFOL;  Surgeon: Jonathon Bellows, MD;  Location: [facility] ENDOSCOPY;  Service: Gastroenterology;  Laterality: N/A;   CORONARY ANGIOPLASTY WITH STENT PLACEMENT  2010   Drug eluting stent   ESOPHAGOGASTRODUODENOSCOPY (EGD) WITH PROPOFOL N/A 07/11/2019   Procedure: ESOPHAGOGASTRODUODENOSCOPY (EGD) WITH PROPOFOL;  Surgeon: Jonathon Bellows, MD;  Location: [facility] ENDOSCOPY;  Service: Gastroenterology;  Laterality: N/A;   LEFT HEART CATH AND CORONARY ANGIOGRAPHY Left 09/11/2020   Procedure: LEFT HEART CATH AND CORONARY ANGIOGRAPHY;  Surgeon: Nelva Bush, MD;  Location: [facility];  Service: Cardiovascular;  Laterality: Left;   OVARIAN CYST REMOVAL      Social History   Socioeconomic History   Marital status: Single    Spouse name: Not on file   Number of children: 1   Years of education: Not on file   Highest education level: Not on file   Occupational History   Not on file  Tobacco Use   Smoking status: Former    Packs/day: 1.00    Years: 30.00    Pack years: 30.00    Types: Cigarettes    Quit date: 12/31/2020    Years since quitting: 0.6   Smokeless tobacco: Never   Tobacco comments:    Quit after being admitted on 6/20 01/17/2021  Vaping Use   Vaping Use: Former  Substance and Sexual Activity   Alcohol use: Yes    Comment: once a year   Drug use: Not Currently    Types: Marijuana, Cocaine   Sexual activity: Not Currently  Other Topics Concern   Not on file  Social History Narrative   From [place] now living in [place]    1 son    No guns    Wears seat belt   Safe in relationship    Social Determinants of Health   Financial Resource Strain: Low Risk    Difficulty of Paying Living Expenses: Not very hard  Food Insecurity: No Food Insecurity   Worried About Running Out  of Food in the Last Year: Never true   [place] in the Last Year: Never true  Transportation Needs: No Transportation Needs   Lack of Transportation (Medical): No   Lack of Transportation (Non-Medical): No  Physical Activity: Not on file  Stress: No Stress Concern Present   Feeling of Stress : Not at all  Social Connections: Unknown   Frequency of Communication with Friends and Family: More than three times a week   Frequency of Social Gatherings with Friends and Family: More than three times a week   Attends Religious Services: Not on Electrical engineer or Organizations: Not on file   Attends Archivist Meetings: Not on file   Marital Status: Not on file  Intimate Partner Violence: Not At Risk   Fear of Current or Ex-Partner: No   Emotionally Abused: No   Physically Abused: No   Sexually Abused: No    Family History  Problem Relation Age of Onset   CAD Mother    Depression Mother    Heart disease Mother    Hyperlipidemia Mother    Hypertension Mother    Heart disease Father     Alcohol abuse Father    Cancer Brother 53       brain   CAD Brother    Depression Brother    Diabetes Brother    Heart disease Brother    Hyperlipidemia Brother    Lupus Other    Sickle cell anemia Other      Current Outpatient Medications:    albuterol (PROAIR HFA) 108 (90 Base) MCG/ACT inhaler, Inhale 1-2 puffs into the lungs every 4 (four) hours as needed for wheezing or shortness of breath., Disp: 54 g, Rfl: 3   amLODipine (NORVASC) 10 MG tablet, TAKE 1 TABLET BY MOUTH AT  BEDTIME, Disp: 90 tablet, Rfl: 3   aspirin 81 MG chewable tablet, Chew 81 mg by mouth daily., Disp: , Rfl:    atorvastatin (LIPITOR) 80 MG tablet, TAKE 1 TABLET BY MOUTH  DAILY AT 6 PM., Disp: 90 tablet, Rfl: 3   Budeson-Glycopyrrol-Formoterol (BREZTRI AEROSPHERE) 160-9-4.8 MCG/ACT AERO, Inhale 2 puffs into the lungs in the morning and at bedtime. Rinse, Disp: 32.1 g, Rfl: 3   clopidogrel (PLAVIX) 75 MG tablet, TAKE 1 TABLET BY MOUTH  DAILY, Disp: 90 tablet, Rfl: 3   divalproex (DEPAKOTE) 500 MG DR tablet, TAKE 1 TABLET BY MOUTH  TWICE DAILY, Disp: 180 tablet, Rfl: 3   ezetimibe (ZETIA) 10 MG tablet, TAKE 1 TABLET BY MOUTH  DAILY WITH LIPITOR 80 MG AT NIGHT, Disp: 90 tablet, Rfl: 3   isosorbide mononitrate (IMDUR) 30 MG 24 hr tablet, Take 3 tablets (90 mg) by mouth once daily, Disp: 90 tablet, Rfl: 6   methocarbamol (ROBAXIN) 500 MG tablet, Take 1 tablet (500 mg total) by mouth 2 (two) times daily as needed for muscle spasms., Disp: 180 tablet, Rfl: 1   metoCLOPramide (REGLAN) 5 MG tablet, TAKE 1 TABLET BY MOUTH 4  TIMES DAILY BEFORE MEALS  AND AT BEDTIME, Disp: 360 tablet, Rfl: 2   mometasone (ELOCON) 0.1 % cream, Apply 1 application topically as directed. Qhs 5 days a week Monday through Friday, Disp: 45 g, Rfl: 2   olmesartan-hydrochlorothiazide (BENICAR HCT) 40-25 MG tablet, Take 0.5 tablets by mouth daily. If BP 130/80 or greater, take 1 tablet (40-25 mg), Disp: , Rfl:    ondansetron (ZOFRAN-ODT) 4 MG  disintegrating tablet, DISSOLVE 1 TABLET ON  THE  TONGUE EVERY 8 HOURS AS  NEEDED FOR NAUEA OR  VOMITING, Disp: 270 tablet, Rfl: 1   pantoprazole (PROTONIX) 40 MG tablet, TAKE 1 TABLET BY MOUTH  TWICE DAILY BEFORE MEALS, Disp: 180 tablet, Rfl: 3   promethazine (PHENERGAN) 25 MG tablet, Take 1 tablet (25 mg total) by mouth 2 (two) times daily as needed for nausea or vomiting., Disp: 20 tablet, Rfl: 0   REPATHA SURECLICK 106 MG/ML SOAJ, INJECT 140MG SUBCUTANEOUSLY EVERY 2 WEEKS, Disp: 6 mL, Rfl: 0   traZODone (DESYREL) 150 MG tablet, TAKE 1 TABLET BY MOUTH AT  BEDTIME AS NEEDED FOR SLEEP, Disp: 90 tablet, Rfl: 3   nitroGLYCERIN (NITROSTAT) 0.4 MG SL tablet, DISSOLVE 1 TABLET UNDER  TONGUE EVERY 5 MINUTES AS  NEEDED FOR CHEST PAIN MAX  OF 3 TABLETS IN 15 MINUTES  . CALL 911 AFTER THIRD DOSE, Disp: 75 tablet, Rfl: 4   topiramate (TOPAMAX) 25 MG tablet, Take 1 tablet (25 mg total) by mouth at bedtime., Disp: 90 tablet, Rfl: 0 No current facility-administered medications for this visit.  Facility-Administered Medications Ordered in Other Visits:    albuterol (PROVENTIL) (2.5 MG/3ML) 0.083% nebulizer solution 2.5 mg, 2.5 mg, Nebulization, Once, Tyler Pita, MD  Physical exam:  Vitals:   08/27/21 1325  BP: 124/86  Pulse: 84  Resp: 16  Temp: 98.6 F (37 C)  TempSrc: Tympanic  Weight: 179 lb 3.2 oz (81.3 kg)  Height: '5\' 3"'  (1.6 m)   Physical Exam Cardiovascular:     Rate and Rhythm: Normal rate and regular rhythm.     Heart sounds: Normal heart sounds.  Pulmonary:     Effort: Pulmonary effort is normal.     Breath sounds: Normal breath sounds.  Abdominal:     General: Bowel sounds are normal.     Palpations: Abdomen is soft.  Skin:    General: Skin is warm and dry.  Neurological:     Mental Status: She is alert and oriented to person, place, and time.     CMP Latest Ref Rng & Units 08/21/2021  Glucose 70 - 99 mg/dL 109(H)  BUN 6 - 24 mg/dL 19  Creatinine 0.57 - 1.00 mg/dL 0.76   Sodium 134 - 144 mmol/L 139  Potassium 3.5 - 5.2 mmol/L 4.6  Chloride 96 - 106 mmol/L 101  CO2 20 - 29 mmol/L 21  Calcium 8.7 - 10.2 mg/dL 9.5  Total Protein 6.0 - 8.5 g/dL 6.6  Total Bilirubin 0.0 - 1.2 mg/dL 0.2  Alkaline Phos 44 - 121 IU/L 116  AST 0 - 40 IU/L 41(H)  ALT 0 - 32 IU/L 58(H)   CBC Latest Ref Rng & Units 08/27/2021  WBC 4.0 - 10.5 K/uL 9.3  Hemoglobin 12.0 - 15.0 g/dL 14.1  Hematocrit 36.0 - 46.0 % 42.4  Platelets 150 - 400 K/uL 475(H)    No images are attached to the encounter.  MR ANGIO HEAD WO CONTRAST  Result Date: 08/29/2021 CLINICAL DATA:  Headache, new or worsening (Age >= 50y) severe HA, new , pulsatile EXAM: MRI HEAD WITHOUT CONTRAST MRA HEAD WITHOUT CONTRAST TECHNIQUE: Multiplanar, multiecho pulse sequences of the brain and surrounding structures were obtained without and with intravenous contrast. Angiographic images of the Circle of Willis were obtained using MRA technique without intravenous contrast. Angiographic images of the neck were obtained using MRA technique without and with intravenous contrast. CONTRAST:  61m GADAVIST GADOBUTROL 1 MMOL/ML IV SOLN COMPARISON:  None. FINDINGS: MRI HEAD FINDINGS Brain: No  acute infarction, hemorrhage, hydrocephalus, extra-axial collection or mass lesion. Mild subtle T2/FLAIR hyperintensities in the white matter, which are nonspecific but considered to be within normal limits for patient age. Partially empty sella. Focus of susceptibility artifact in the lateral right temporal lobe without associated edema, probably the sequela of chronic microhemorrhage. Vascular: See below. Skull and upper cervical spine: Normal marrow signal. Sinuses/Orbits: Clear sinuses.  Unremarkable orbits. Other: No mastoid effusions. MRA HEAD FINDINGS Motion limited.  Within this limitation: Anterior circulation: Bilateral intracranial ICAs, MCAs, and ACAs are patent without proximal high-grade stenosis. Approximately 2 mm inferiorly directed  outpouching arising from the right MCA bifurcation (series 1063, image 184). Posterior circulation: Bilateral intradural vertebral arteries, basilar artery, and posterior cerebral arteries are patent without proximal high-grade stenosis. Right fetal type PCA with small vertebrobasilar system, anatomic variant. IMPRESSION: MRI: 1. No evidence of acute intracranial abnormality. 2. Partially empty sella, which is often a normal anatomic variant but can be associated with idiopathic intracranial hypertension (pseudotumor cerebri). MRA: 1. Motion limited study without large vessel occlusion or proximal high-grade stenosis. 2. Approximately 2 mm inferiorly directed outpouching arising from the right MCA bifurcation, which could represent an aneurysm versus infundibulum with vessel not well seen by MRA. Recommend follow-up CTA to further characterize. Electronically Signed   By: Margaretha Sheffield M.D.   On: 08/29/2021 08:29   MR BRAIN W WO CONTRAST  Result Date: 08/29/2021 CLINICAL DATA:  Headache, new or worsening (Age >= 50y) severe HA, new , pulsatile EXAM: MRI HEAD WITHOUT CONTRAST MRA HEAD WITHOUT CONTRAST TECHNIQUE: Multiplanar, multiecho pulse sequences of the brain and surrounding structures were obtained without and with intravenous contrast. Angiographic images of the Circle of Willis were obtained using MRA technique without intravenous contrast. Angiographic images of the neck were obtained using MRA technique without and with intravenous contrast. CONTRAST:  63m GADAVIST GADOBUTROL 1 MMOL/ML IV SOLN COMPARISON:  None. FINDINGS: MRI HEAD FINDINGS Brain: No acute infarction, hemorrhage, hydrocephalus, extra-axial collection or mass lesion. Mild subtle T2/FLAIR hyperintensities in the white matter, which are nonspecific but considered to be within normal limits for patient age. Partially empty sella. Focus of susceptibility artifact in the lateral right temporal lobe without associated edema, probably the  sequela of chronic microhemorrhage. Vascular: See below. Skull and upper cervical spine: Normal marrow signal. Sinuses/Orbits: Clear sinuses.  Unremarkable orbits. Other: No mastoid effusions. MRA HEAD FINDINGS Motion limited.  Within this limitation: Anterior circulation: Bilateral intracranial ICAs, MCAs, and ACAs are patent without proximal high-grade stenosis. Approximately 2 mm inferiorly directed outpouching arising from the right MCA bifurcation (series 1063, image 184). Posterior circulation: Bilateral intradural vertebral arteries, basilar artery, and posterior cerebral arteries are patent without proximal high-grade stenosis. Right fetal type PCA with small vertebrobasilar system, anatomic variant. IMPRESSION: MRI: 1. No evidence of acute intracranial abnormality. 2. Partially empty sella, which is often a normal anatomic variant but can be associated with idiopathic intracranial hypertension (pseudotumor cerebri). MRA: 1. Motion limited study without large vessel occlusion or proximal high-grade stenosis. 2. Approximately 2 mm inferiorly directed outpouching arising from the right MCA bifurcation, which could represent an aneurysm versus infundibulum with vessel not well seen by MRA. Recommend follow-up CTA to further characterize. Electronically Signed   By: FMargaretha SheffieldM.D.   On: 08/29/2021 08:29     Assessment and plan- Patient is a 56y.o. female here for routine follow-up of leukocytosis and thrombocytosis:   Both leukocytosis and thrombocytosis waxes and wanes.  Today her white count is normal  at 9.3 and platelets mildly elevated at 475.  Myeloproliferative work-up in the past has been otherwise unremarkable.  No indication for bone marrow biopsy at this time.  Anemia significantly improved and hemoglobin is normal at 14.1.  Easy bruising: Work-up for bleeding disorder was otherwise negative.  She is also on Plavix.  Continue to monitor.  CBC with differential in 6 months in 1 year and  I will see her back in 1 year   Visit Diagnosis 1. Leukocytosis, unspecified type      Dr. Randa Evens, MD, MPH [facility] at [facility] [phone] 08/29/2021 9:25 AM

## 2021-08-30 NOTE — Telephone Encounter (Signed)
Noted thank you

## 2021-09-02 ENCOUNTER — Telehealth: Payer: Self-pay | Admitting: Internal Medicine

## 2021-09-02 DIAGNOSIS — R930 Abnormal findings on diagnostic imaging of skull and head, not elsewhere classified: Secondary | ICD-10-CM

## 2021-09-02 NOTE — Telephone Encounter (Signed)
Patient calling back in and states she is agreeable to a referral for Neurosurgery

## 2021-09-02 NOTE — Telephone Encounter (Signed)
Call patient I placed referral to neurosurgery, Dr. Carlis Abbott Let us know if you dont hear back within a week in regards to an appointment being scheduled.

## 2021-09-02 NOTE — Telephone Encounter (Signed)
[place] to see no acute findings in your imaging.  I ave sent a message to neurosurgery, Dr. Liliana Cline as a relates to the incidental finding of a possible aneurysm. I think unlikely the result of your headache.  I think that untreated sleep apnea is likely playing a significant role.  However this is important to further neurosurgery evaluate. You may also require a separate ophthalmology or neurology consult.  I will await neurosurgery advice here.  I just wanted to keep you informed.  As soon as I hear back from neurosurgery, I will reach out to you in regards to recommendations.  Hope that you are feeling better and having a nice weekend   Take care,  [name], NP  Written by Burnard Hawthorne on 08/31/2021  1:59 PM EST Seen by patient Shawna Anderson on 09/02/2021 10:09 AM

## 2021-09-02 NOTE — Telephone Encounter (Signed)
Pt called in requesting a call. Please call pt at [phone]. Thank you!

## 2021-09-03 ENCOUNTER — Other Ambulatory Visit: Payer: Self-pay | Admitting: Family

## 2021-09-03 DIAGNOSIS — R519 Headache, unspecified: Secondary | ICD-10-CM

## 2021-09-05 DIAGNOSIS — G932 Benign intracranial hypertension: Secondary | ICD-10-CM | POA: Diagnosis not present

## 2021-09-05 LAB — HM DIABETES EYE EXAM

## 2021-09-06 NOTE — Telephone Encounter (Signed)
Patient advised of below message.

## 2021-09-10 ENCOUNTER — Encounter: Payer: Self-pay | Admitting: Internal Medicine

## 2021-09-10 DIAGNOSIS — G932 Benign intracranial hypertension: Secondary | ICD-10-CM | POA: Insufficient documentation

## 2021-09-12 ENCOUNTER — Telehealth (INDEPENDENT_AMBULATORY_CARE_PROVIDER_SITE_OTHER): Payer: Medicare Other | Admitting: Internal Medicine

## 2021-09-12 ENCOUNTER — Encounter: Payer: Self-pay | Admitting: Internal Medicine

## 2021-09-12 ENCOUNTER — Telehealth: Payer: Self-pay

## 2021-09-12 ENCOUNTER — Other Ambulatory Visit: Payer: Self-pay

## 2021-09-12 VITALS — BP 154/92 | HR 74 | Ht 63.0 in | Wt 180.0 lb

## 2021-09-12 DIAGNOSIS — E236 Other disorders of pituitary gland: Secondary | ICD-10-CM

## 2021-09-12 DIAGNOSIS — R1033 Periumbilical pain: Secondary | ICD-10-CM | POA: Diagnosis not present

## 2021-09-12 DIAGNOSIS — R197 Diarrhea, unspecified: Secondary | ICD-10-CM | POA: Diagnosis not present

## 2021-09-12 DIAGNOSIS — G43919 Migraine, unspecified, intractable, without status migrainosus: Secondary | ICD-10-CM

## 2021-09-12 DIAGNOSIS — R1084 Generalized abdominal pain: Secondary | ICD-10-CM | POA: Diagnosis not present

## 2021-09-12 DIAGNOSIS — R112 Nausea with vomiting, unspecified: Secondary | ICD-10-CM

## 2021-09-12 DIAGNOSIS — R930 Abnormal findings on diagnostic imaging of skull and head, not elsewhere classified: Secondary | ICD-10-CM

## 2021-09-12 MED ORDER — DICYCLOMINE HCL 20 MG PO TABS: 20.0000 mg | ORAL_TABLET | Freq: Three times a day (TID) | ORAL | 2 refills | Status: AC

## 2021-09-12 NOTE — Telephone Encounter (Signed)
-----   Message from Lin Landsman, MD sent at 09/12/2021  3:19 PM EST ----- ?She was supposed to follow-up with me in 3 months, was a no-show in November ? ?Caryl Pina ? ?Please reach out to her and make a follow-up appointment, okay to overbook ? ?Thanks ?RV ?----- Message ----- ?From: McLean-Scocuzza, Nino Glow, MD ?Sent: 09/12/2021   9:08 AM EST ?To: Lin Landsman, MD ? ?Pt still having ab pain, nausea/vomiting  ?Can you reach out for f/U as well ?  ?? Gastroenteritis  ?Periumbilical abdominal pain ?Generalized abdominal pain ?N/v/diarrhea  ?-cc Gi established  ?Peptobismol/mylanta  ?Prn tylenol  ?Reglan, prn zofran, phenerghan  ?Brat diet  ?Add back dicyclomine  ? ? ?

## 2021-09-12 NOTE — Telephone Encounter (Signed)
Made appointment 10/01/21 ?

## 2021-09-12 NOTE — Progress Notes (Signed)
Virtual Visit via Video Note  I connected with [facility]  on 09/12/21 at  8:40 AM EST by a video enabled telemedicine application and verified that I am speaking with the correct person using two identifiers.  Location patient: [place] Location provider:work or home office Persons participating in the virtual visit: patient, provider  I discussed the limitations and requested verbal permission for telemedicine visit. The patient expressed understanding and agreed to proceed.   HPI:  Acute telemedicine visit for : X 2 nights this week stomach severe pain midline 10/10 sharp prior surgery with vomiting though less than baseline and diarrhea brown and smell but normally stool does not smell had stool the entire night friend also had projectile vomiting. She has reglan, prn zofran and phenergan which she tried and peptobismol and mylanta with some relief  Migraines frontal eye MD does not think related to MRI results of empty sella and referred to [facility] in [place] who rec Dr. Candis Shine in [place] but pt not heard about referral  MRI/A abnormal and rec CTA to further chracterize will order this  Will refer to neurology for now  3. Htn bp variable and reports elevated today on norvasc 10 mg qd was on benicar 40-25 1/2 dose  Will reasess at f/u 10/22/21 but prior appt in office BP normal   -Pertinent past medical history: see below -Pertinent medication allergies: Allergies  Allergen Reactions   Ativan [Lorazepam]     Pt states it makes her tongue do weird things    Augmentin [Amoxicillin-Pot Clavulanate]     Upset stomach    Lactose Intolerance (Gi)    Latex Other (See Comments)    Patient stated that she was told by her doctor that she is "allergic to" this   Tape Other (See Comments)    Patient stated that she was told by her doctor that she is "allergic to" this   Xifaxan [Rifaximin]     Pt believes this is contributing to her abdominal pain/discomfort   Drixoral Allergy  Sinus [Dexbromphen-Pse-Apap Er] Rash   -COVID-19 vaccine status:  Immunization History  Administered Date(s) Administered   PFIZER(Purple Top)SARS-COV-2 Vaccination 11/04/2019, 11/18/2019, 04/23/2020   Zoster Recombinat (Shingrix) 06/12/2021     ROS: See pertinent positives and negatives per HPI.  Past Medical History:  Diagnosis Date   Anxiety    Asthma    Bipolar 1 disorder ([place])    Bipolar disorder ([place])    CAD (coronary artery disease)    s/p stent BMS OM Cx   Cervical herniated disc 04/12/2016   COPD (chronic obstructive pulmonary disease) (HCC)    Depression    Diabetes mellitus with gastroparesis (HCC)    Diabetes mellitus without complication (HCC)    Diverticulitis    GERD (gastroesophageal reflux disease)    Glaucoma    History of blood transfusion    Hyperlipidemia    Hypertension    OSA (obstructive sleep apnea)    not using cpap    Plantar fasciitis    b/l feet s/p steroid shots w/o help and left surgery w/o help    UTI (urinary tract infection)     Past Surgical History:  Procedure Laterality Date   ABDOMINAL HYSTERECTOMY     ABDOMINAL SURGERY  1995   Bowel resection.   CARDIAC CATHETERIZATION N/A 04/14/2016   Procedure: Left Heart Cath and Coronary Angiography;  Surgeon: Burnell Blanks, MD;  Location: [facility] [facility];  Service: Cardiovascular;  Laterality: N/A;   CARDIAC  SURGERY     COLONOSCOPY WITH PROPOFOL N/A 07/11/2019   Procedure: COLONOSCOPY WITH PROPOFOL;  Surgeon: Jonathon Bellows, MD;  Location: [facility] ENDOSCOPY;  Service: Gastroenterology;  Laterality: N/A;   COLONOSCOPY WITH PROPOFOL N/A 07/29/2019   Procedure: COLONOSCOPY WITH PROPOFOL;  Surgeon: Jonathon Bellows, MD;  Location: [facility] ENDOSCOPY;  Service: Gastroenterology;  Laterality: N/A;   CORONARY ANGIOPLASTY WITH STENT PLACEMENT  2010   Drug eluting stent   ESOPHAGOGASTRODUODENOSCOPY (EGD) WITH PROPOFOL N/A 07/11/2019   Procedure: ESOPHAGOGASTRODUODENOSCOPY (EGD) WITH PROPOFOL;   Surgeon: Jonathon Bellows, MD;  Location: [facility] ENDOSCOPY;  Service: Gastroenterology;  Laterality: N/A;   LEFT HEART CATH AND CORONARY ANGIOGRAPHY Left 09/11/2020   Procedure: LEFT HEART CATH AND CORONARY ANGIOGRAPHY;  Surgeon: Nelva Bush, MD;  Location: [place] [facility];  Service: Cardiovascular;  Laterality: Left;   OVARIAN CYST REMOVAL       Current Outpatient Medications:    albuterol (PROAIR HFA) 108 (90 Base) MCG/ACT inhaler, Inhale 1-2 puffs into the lungs every 4 (four) hours as needed for wheezing or shortness of breath., Disp: 54 g, Rfl: 3   amLODipine (NORVASC) 10 MG tablet, TAKE 1 TABLET BY MOUTH AT  BEDTIME, Disp: 90 tablet, Rfl: 3   aspirin 81 MG chewable tablet, Chew 81 mg by mouth daily., Disp: , Rfl:    atorvastatin (LIPITOR) 80 MG tablet, TAKE 1 TABLET BY MOUTH  DAILY AT 6 PM., Disp: 90 tablet, Rfl: 3   Budeson-Glycopyrrol-Formoterol (BREZTRI AEROSPHERE) 160-9-4.8 MCG/ACT AERO, Inhale 2 puffs into the lungs in the morning and at bedtime. Rinse, Disp: 32.1 g, Rfl: 3   clopidogrel (PLAVIX) 75 MG tablet, TAKE 1 TABLET BY MOUTH  DAILY, Disp: 90 tablet, Rfl: 3   dicyclomine (BENTYL) 20 MG tablet, Take 1 tablet (20 mg total) by mouth 4 (four) times daily -  before meals and at bedtime., Disp: 120 tablet, Rfl: 2   divalproex (DEPAKOTE) 500 MG DR tablet, TAKE 1 TABLET BY MOUTH  TWICE DAILY, Disp: 180 tablet, Rfl: 3   ezetimibe (ZETIA) 10 MG tablet, TAKE 1 TABLET BY MOUTH  DAILY WITH LIPITOR 80 MG AT NIGHT, Disp: 90 tablet, Rfl: 3   isosorbide mononitrate (IMDUR) 30 MG 24 hr tablet, Take 3 tablets (90 mg) by mouth once daily, Disp: 90 tablet, Rfl: 6   methocarbamol (ROBAXIN) 500 MG tablet, Take 1 tablet (500 mg total) by mouth 2 (two) times daily as needed for muscle spasms., Disp: 180 tablet, Rfl: 1   metoCLOPramide (REGLAN) 5 MG tablet, TAKE 1 TABLET BY MOUTH 4  TIMES DAILY BEFORE MEALS  AND AT BEDTIME, Disp: 360 tablet, Rfl: 2   mometasone (ELOCON) 0.1 % cream, Apply 1 application  topically as directed. Qhs 5 days a week Monday through Friday, Disp: 45 g, Rfl: 2   olmesartan-hydrochlorothiazide (BENICAR HCT) 40-25 MG tablet, Take 0.5 tablets by mouth daily. If BP 130/80 or greater, take 1 tablet (40-25 mg), Disp: , Rfl:    ondansetron (ZOFRAN-ODT) 4 MG disintegrating tablet, DISSOLVE 1 TABLET ON THE  TONGUE EVERY 8 HOURS AS  NEEDED FOR NAUEA OR  VOMITING, Disp: 270 tablet, Rfl: 1   pantoprazole (PROTONIX) 40 MG tablet, TAKE 1 TABLET BY MOUTH  TWICE DAILY BEFORE MEALS, Disp: 180 tablet, Rfl: 3   promethazine (PHENERGAN) 25 MG tablet, Take 1 tablet (25 mg total) by mouth 2 (two) times daily as needed for nausea or vomiting., Disp: 20 tablet, Rfl: 0   REPATHA SURECLICK 809 MG/ML SOAJ, INJECT 140MG  SUBCUTANEOUSLY EVERY 2 WEEKS,  Disp: 6 mL, Rfl: 0   topiramate (TOPAMAX) 25 MG tablet, Take 1 tablet (25 mg total) by mouth at bedtime., Disp: 90 tablet, Rfl: 0   nitroGLYCERIN (NITROSTAT) 0.4 MG SL tablet, DISSOLVE 1 TABLET UNDER  TONGUE EVERY 5 MINUTES AS  NEEDED FOR CHEST PAIN MAX  OF 3 TABLETS IN 15 MINUTES  . CALL 911 AFTER THIRD DOSE (Patient not taking: Reported on 09/12/2021), Disp: 75 tablet, Rfl: 4 No current facility-administered medications for this visit.  Facility-Administered Medications Ordered in Other Visits:    albuterol (PROVENTIL) (2.5 MG/3ML) 0.083% nebulizer solution 2.5 mg, 2.5 mg, Nebulization, Once, Tyler Pita, MD  EXAM:  VITALS per patient if applicable:  GENERAL: alert, oriented, appears well and in no acute distress  HEENT: atraumatic, conjunttiva clear, no obvious abnormalities on inspection of external nose and ears  NECK: normal movements of the head and neck  LUNGS: on inspection no signs of respiratory distress, breathing rate appears normal, no obvious gross SOB, gasping or wheezing  CV: no obvious cyanosis  MS: moves all visible extremities without noticeable abnormality  PSYCH/NEURO: pleasant and cooperative, no obvious depression  or anxiety, speech and thought processing grossly intact  ASSESSMENT AND PLAN:  Discussed the following assessment and plan:  ? Gastroenteritis  Periumbilical abdominal pain Generalized abdominal pain N/v/diarrhea  -cc Gi established  Peptobismol/mylanta  Prn tylenol  Reglan, prn zofran, phenerghan  Brat diet  Add back dicyclomine   Empty sella syndrome (HCC) Intractable migraine without status migrainosus, unspecified migraine type - Plan: Ambulatory referral to Neurology, CT ANGIO HEAD W OR WO CONTRAST Abnormal MRI of head - Plan: Ambulatory referral to Neurology, CT ANGIO HEAD W OR WO CONTRAST Hold referral to [facility] Dr. Candis Shine [phone] [place] [address]  Htn variable Assess at f/u on norvasc 10 mg qd  Benicar 40-25 1/2 tab was on hold will see if needs at f/iu   Hm -2nd shingrix at f/u  CPE at f/u   -we discussed possible serious and likely etiologies, options for evaluation and workup, limitations of telemedicine visit vs in person visit, treatment, treatment risks and precautions. Pt is agreeable to treatment via telemedicine at this moment.    I discussed the assessment and treatment plan with the patient. The patient was provided an opportunity to ask questions and all were answered. The patient agreed with the plan and demonstrated an understanding of the instructions.    Time spent 20 min Delorise Jackson, MD

## 2021-09-23 ENCOUNTER — Ambulatory Visit: Payer: Medicare Other | Admitting: Dermatology

## 2021-09-26 ENCOUNTER — Other Ambulatory Visit: Payer: Self-pay

## 2021-09-26 ENCOUNTER — Ambulatory Visit (INDEPENDENT_AMBULATORY_CARE_PROVIDER_SITE_OTHER): Payer: Medicare Other

## 2021-09-26 ENCOUNTER — Ambulatory Visit
Admission: RE | Admit: 2021-09-26 | Discharge: 2021-09-26 | Disposition: A | Discharge status: Home / Self Care | Payer: Medicare Other | Source: Ambulatory Visit | Attending: Internal Medicine | Admitting: Internal Medicine

## 2021-09-26 DIAGNOSIS — G43919 Migraine, unspecified, intractable, without status migrainosus: Secondary | ICD-10-CM | POA: Insufficient documentation

## 2021-09-26 DIAGNOSIS — I671 Cerebral aneurysm, nonruptured: Secondary | ICD-10-CM | POA: Diagnosis not present

## 2021-09-26 DIAGNOSIS — I739 Peripheral vascular disease, unspecified: Secondary | ICD-10-CM

## 2021-09-26 DIAGNOSIS — R930 Abnormal findings on diagnostic imaging of skull and head, not elsewhere classified: Secondary | ICD-10-CM | POA: Diagnosis not present

## 2021-09-26 LAB — POCT I-STAT CREATININE: Creatinine, Ser: 1 mg/dL (ref 0.44–1.00)

## 2021-09-26 MED ORDER — IOHEXOL 350 MG/ML SOLN
75.0000 mL | Freq: Once | INTRAVENOUS | Status: AC | PRN
  Administered 2021-09-26: 75 mL via INTRAVENOUS

## 2021-09-27 ENCOUNTER — Ambulatory Visit: Payer: Medicare Other | Admitting: Adult Health

## 2021-09-28 ENCOUNTER — Encounter: Payer: Self-pay | Admitting: Internal Medicine

## 2021-09-28 DIAGNOSIS — I729 Aneurysm of unspecified site: Secondary | ICD-10-CM | POA: Insufficient documentation

## 2021-09-29 ENCOUNTER — Encounter: Payer: Self-pay | Admitting: Internal Medicine

## 2021-09-30 NOTE — Telephone Encounter (Signed)
Patient would like a call back about her CT Scan results. She prefers to speak to Dr Olivia Mackie. ?

## 2021-09-30 NOTE — Telephone Encounter (Signed)
Pt called in stating that she have reached out to neurology to schedule appt. Pt stated that neurology first appt is on May 12.... Pt was wondering if that's to long of wait for her to go to see neurology or should she be seen sooner.... Pt requesting callback....  ?

## 2021-10-01 ENCOUNTER — Ambulatory Visit (INDEPENDENT_AMBULATORY_CARE_PROVIDER_SITE_OTHER): Payer: Medicare Other | Admitting: Gastroenterology

## 2021-10-01 ENCOUNTER — Telehealth (INDEPENDENT_AMBULATORY_CARE_PROVIDER_SITE_OTHER): Payer: Medicare Other | Admitting: Internal Medicine

## 2021-10-01 ENCOUNTER — Other Ambulatory Visit: Payer: Self-pay

## 2021-10-01 ENCOUNTER — Encounter: Payer: Self-pay | Admitting: Gastroenterology

## 2021-10-01 ENCOUNTER — Telehealth: Payer: Self-pay | Admitting: Internal Medicine

## 2021-10-01 ENCOUNTER — Encounter: Payer: Self-pay | Admitting: Internal Medicine

## 2021-10-01 VITALS — BP 142/77 | HR 98 | Temp 98.7°F | Ht 63.0 in | Wt 185.1 lb

## 2021-10-01 VITALS — BP 142/77 | Ht 63.0 in | Wt 185.1 lb

## 2021-10-01 DIAGNOSIS — R519 Headache, unspecified: Secondary | ICD-10-CM | POA: Diagnosis not present

## 2021-10-01 DIAGNOSIS — I729 Aneurysm of unspecified site: Secondary | ICD-10-CM

## 2021-10-01 DIAGNOSIS — R7989 Other specified abnormal findings of blood chemistry: Secondary | ICD-10-CM

## 2021-10-01 DIAGNOSIS — G8929 Other chronic pain: Secondary | ICD-10-CM

## 2021-10-01 DIAGNOSIS — K3184 Gastroparesis: Secondary | ICD-10-CM | POA: Diagnosis not present

## 2021-10-01 DIAGNOSIS — K5909 Other constipation: Secondary | ICD-10-CM | POA: Diagnosis not present

## 2021-10-01 DIAGNOSIS — I739 Peripheral vascular disease, unspecified: Secondary | ICD-10-CM | POA: Diagnosis not present

## 2021-10-01 DIAGNOSIS — I10 Essential (primary) hypertension: Secondary | ICD-10-CM | POA: Diagnosis not present

## 2021-10-01 DIAGNOSIS — R29898 Other symptoms and signs involving the musculoskeletal system: Secondary | ICD-10-CM

## 2021-10-01 DIAGNOSIS — K649 Unspecified hemorrhoids: Secondary | ICD-10-CM | POA: Diagnosis not present

## 2021-10-01 MED ORDER — OLMESARTAN MEDOXOMIL-HCTZ 20-12.5 MG PO TABS: 1.0000 | ORAL_TABLET | Freq: Every day | ORAL | 3 refills | Status: DC

## 2021-10-01 NOTE — Progress Notes (Signed)
Telephone Note ? ?I connected with [facility] ? on 10/01/21 at  2:40 PM EDT by telephone  and verified that I am speaking with the correct person using two identifiers. ? Location patient: Shawna Anderson ?Location provider:work or home office ?Persons participating in the virtual visit: patient, provider ? ?I discussed the limitations and requested verbal permission for telemedicine visit. The patient expressed understanding and agreed to proceed. ? ? ?HPI: ? ?Acute telemedicine visit for : ?Ct head + aneurysms will refer to Dr. Estanislado Pandy to see if intervention needs to be done  ?IMPRESSION: ?1. Right PCOM region aneurysm measuring 4.4 x 2.8 mm. The right PCA ?is fetal type. ?2. Right MCA bifurcation aneurysm measuring 3 mm and showing a ?lateral lobulation. ?  ?2. Htn elevated 135-175 at time taking benicar-hct 20-12.5 and if sbp >130 taking 2 and norvasc 10 mg qd  ? ?3. C/o chronic ha and weakness arms and legs and fingers trouble with griping and opening items  topamax 25 mg qd is not helping with h/a  ? ?4. PAD noted on imaging cc Dr. Saunders Revel to see if any intervention needed or vascular referral. She is having rest pain ?Summary:  ?Right: Total occlusion noted in the peroneal artery.  ? ?Left: 30-49% stenosis noted in the common femoral artery. Total occlusion  ?noted in the peroneal artery.  ? ? ? ?Bilateral peroneal veins were seen while color flow and PW Doppler did not  ?demonstrate arterial flow  ? ?   ?See table(s) above for measurements and observations.  ? ? ? ? ?Electronically signed by Kathlyn Sacramento MD on 09/27/2021 at 10:45:38 AM.  ?  ?Electronically Signed ?  By: Jorje Guild M.D. ?  On: 09/27/2021 21:18 ?H/a  ?-Pertinent past medical history: see below ?-Pertinent medication allergies: ?Allergies  ?Allergen Reactions  ? Ativan [Lorazepam]   ?  Pt states it makes her tongue do weird things   ? Augmentin [Amoxicillin-Pot Clavulanate]   ?  Upset stomach ?  ? Lactose Intolerance (Gi)   ? Latex Other (See  Comments)  ?  Patient stated that she was told by her doctor that she is "allergic to" this  ? Tape Other (See Comments)  ?  Patient stated that she was told by her doctor that she is "allergic to" this  ? Xifaxan [Rifaximin]   ?  Pt believes this is contributing to her abdominal pain/discomfort  ? Drixoral Allergy Sinus [Dexbromphen-Pse-Apap Er] Rash  ? ?-COVID-19 vaccine status:  ?Immunization History  ?Administered Date(s) Administered  ? PFIZER(Purple Top)SARS-COV-2 Vaccination 11/04/2019, 11/18/2019, 04/23/2020  ? Zoster Recombinat (Shingrix) 06/12/2021  ? ? ? ?ROS: See pertinent positives and negatives per HPI. ? ?Past Medical History:  ?Diagnosis Date  ? Anxiety   ? Asthma   ? Bipolar 1 disorder ([place])   ? Bipolar disorder ([place])   ? CAD (coronary artery disease)   ? s/p stent BMS OM Cx  ? Cervical herniated disc 04/12/2016  ? COPD (chronic obstructive pulmonary disease) ([place])   ? Depression   ? Diabetes mellitus with gastroparesis ([place])   ? Diabetes mellitus without complication ([place])   ? Diverticulitis   ? GERD (gastroesophageal reflux disease)   ? Glaucoma   ? History of blood transfusion   ? Hyperlipidemia   ? Hypertension   ? OSA (obstructive sleep apnea)   ? not using cpap   ? Plantar fasciitis   ? b/l feet s/p steroid shots w/o help and left surgery w/o help   ?  UTI (urinary tract infection)   ? ? ?Past Surgical History:  ?Procedure Laterality Date  ? ABDOMINAL HYSTERECTOMY    ? ABDOMINAL SURGERY  1995  ? Bowel resection.  ? CARDIAC CATHETERIZATION N/A 04/14/2016  ? Procedure: Left Heart Cath and Coronary Angiography;  Surgeon: Burnell Blanks, MD;  Location: [facility];  Service: Cardiovascular;  Laterality: N/A;  ? CARDIAC SURGERY    ? COLONOSCOPY WITH PROPOFOL N/A 07/11/2019  ? Procedure: COLONOSCOPY WITH PROPOFOL;  Surgeon: Jonathon Bellows, MD;  Location: [facility] ENDOSCOPY;  Service: Gastroenterology;  Laterality: N/A;  ? COLONOSCOPY WITH PROPOFOL N/A 07/29/2019  ? Procedure: COLONOSCOPY WITH  PROPOFOL;  Surgeon: Jonathon Bellows, MD;  Location: [facility] ENDOSCOPY;  Service: Gastroenterology;  Laterality: N/A;  ? CORONARY ANGIOPLASTY WITH STENT PLACEMENT  2010  ? Drug eluting stent  ? ESOPHAGOGASTRODUODENOSCOPY (EGD) WITH PROPOFOL N/A 07/11/2019  ? Procedure: ESOPHAGOGASTRODUODENOSCOPY (EGD) WITH PROPOFOL;  Surgeon: Jonathon Bellows, MD;  Location: [facility] ENDOSCOPY;  Service: Gastroenterology;  Laterality: N/A;  ? LEFT HEART CATH AND CORONARY ANGIOGRAPHY Left 09/11/2020  ? Procedure: LEFT HEART CATH AND CORONARY ANGIOGRAPHY;  Surgeon: Nelva Bush, MD;  Location: [facility];  Service: Cardiovascular;  Laterality: Left;  ? OVARIAN CYST REMOVAL    ? ? ? ?Current Outpatient Medications:  ?  albuterol (PROAIR HFA) 108 (90 Base) MCG/ACT inhaler, Inhale 1-2 puffs into the lungs every 4 (four) hours as needed for wheezing or shortness of breath., Disp: 54 g, Rfl: 3 ?  amLODipine (NORVASC) 10 MG tablet, TAKE 1 TABLET BY MOUTH AT  BEDTIME, Disp: 90 tablet, Rfl: 3 ?  aspirin 81 MG chewable tablet, Chew 81 mg by mouth daily., Disp: , Rfl:  ?  atorvastatin (LIPITOR) 80 MG tablet, TAKE 1 TABLET BY MOUTH  DAILY AT 6 PM., Disp: 90 tablet, Rfl: 3 ?  Budeson-Glycopyrrol-Formoterol (BREZTRI AEROSPHERE) 160-9-4.8 MCG/ACT AERO, Inhale 2 puffs into the lungs in the morning and at bedtime. Rinse, Disp: 32.1 g, Rfl: 3 ?  clopidogrel (PLAVIX) 75 MG tablet, TAKE 1 TABLET BY MOUTH  DAILY, Disp: 90 tablet, Rfl: 3 ?  dicyclomine (BENTYL) 20 MG tablet, Take 1 tablet (20 mg total) by mouth 4 (four) times daily -  before meals and at bedtime., Disp: 120 tablet, Rfl: 2 ?  divalproex (DEPAKOTE) 500 MG DR tablet, TAKE 1 TABLET BY MOUTH  TWICE DAILY, Disp: 180 tablet, Rfl: 3 ?  ezetimibe (ZETIA) 10 MG tablet, TAKE 1 TABLET BY MOUTH  DAILY WITH LIPITOR 80 MG AT NIGHT, Disp: 90 tablet, Rfl: 3 ?  isosorbide mononitrate (IMDUR) 30 MG 24 hr tablet, Take 3 tablets (90 mg) by mouth once daily, Disp: 90 tablet, Rfl: 6 ?  methocarbamol (ROBAXIN) 500 MG  tablet, Take 1 tablet (500 mg total) by mouth 2 (two) times daily as needed for muscle spasms., Disp: 180 tablet, Rfl: 1 ?  metoCLOPramide (REGLAN) 5 MG tablet, TAKE 1 TABLET BY MOUTH 4  TIMES DAILY BEFORE MEALS  AND AT BEDTIME, Disp: 360 tablet, Rfl: 2 ?  mometasone (ELOCON) 0.1 % cream, Apply 1 application topically as directed. Qhs 5 days a week Monday through Friday, Disp: 45 g, Rfl: 2 ?  nitroGLYCERIN (NITROSTAT) 0.4 MG SL tablet, DISSOLVE 1 TABLET UNDER  TONGUE EVERY 5 MINUTES AS  NEEDED FOR CHEST PAIN MAX  OF 3 TABLETS IN 15 MINUTES  . CALL 911 AFTER THIRD DOSE, Disp: 75 tablet, Rfl: 4 ?  olmesartan-hydrochlorothiazide (BENICAR HCT) 20-12.5 MG tablet, Take 1 tablet by mouth daily.  In am if BP >130/>80 take 2 pills to equal 40-25 mg qd, Disp: 180 tablet, Rfl: 3 ?  ondansetron (ZOFRAN-ODT) 4 MG disintegrating tablet, DISSOLVE 1 TABLET ON THE  TONGUE EVERY 8 HOURS AS  NEEDED FOR NAUEA OR  VOMITING, Disp: 270 tablet, Rfl: 1 ?  pantoprazole (PROTONIX) 40 MG tablet, TAKE 1 TABLET BY MOUTH  TWICE DAILY BEFORE MEALS, Disp: 180 tablet, Rfl: 3 ?  promethazine (PHENERGAN) 25 MG tablet, Take 1 tablet (25 mg total) by mouth 2 (two) times daily as needed for nausea or vomiting., Disp: 20 tablet, Rfl: 0 ?  REPATHA SURECLICK 381 MG/ML SOAJ, INJECT '140MG'$  SUBCUTANEOUSLY EVERY 2 WEEKS, Disp: 6 mL, Rfl: 0 ?  topiramate (TOPAMAX) 25 MG tablet, Take 1 tablet (25 mg total) by mouth at bedtime., Disp: 90 tablet, Rfl: 0 ?No current facility-administered medications for this visit. ? ?Facility-Administered Medications Ordered in Other Visits:  ?  albuterol (PROVENTIL) (2.5 MG/3ML) 0.083% nebulizer solution 2.5 mg, 2.5 mg, Nebulization, Once, Tyler Pita, MD ? ?EXAM: ? ?VITALS per patient if applicable: ? ?GENERAL: alert, oriented, appears well and in no acute distress ? ? ?PSYCH/NEURO: pleasant and cooperative, no obvious depression or anxiety, speech and thought processing grossly intact ? ?ASSESSMENT AND PLAN: ? ?Discussed  the following assessment and plan: ? ?Aneurysm [facility]) - Plan: Ambulatory referral to Neurology, Ambulatory referral to Interventional Radiology Dr. Juliet Rude ? ?Hypertension, unspecified type - Plan: ol

## 2021-10-01 NOTE — Telephone Encounter (Addendum)
PT has ? About [place] report and wonders if intervention should be done with total occlusion on the right and left ?  ?Can you all call pt and clarify?  ?I believe pt needs a vascular surgery referral? What do you think  ? ? ?Summary:  ?Right: Total occlusion noted in the peroneal artery.  ? ?Left: 30-49% stenosis noted in the common femoral artery. Total occlusion  ?noted in the peroneal artery.  ? ? ? ?Bilateral peroneal veins were seen while color flow and PW Doppler did not  ?demonstrate arterial flow ?

## 2021-10-01 NOTE — Patient Instructions (Addendum)
Please do the Miralax clean out today. Mix 32 ounces of Gatorade with 238 grams of miralax. Drink 8oz every 20 minutes till solution is gone.  ?Gave Linzess 175mg samples. Please let [place] if this helps and we can call you in a prescription.  ?

## 2021-10-01 NOTE — Telephone Encounter (Signed)
I have spoken with Ms. Misener by phone regarding her concerns of the recent lower extremity arterial Doppler study.  Study showed normal ABIs but abnormal TBI on the right.  Bilateral peroneal arteries were occluded.  There was also mild disease involving the left common femoral artery.  Ms. Martus reports a burning quality pain in both feet and toes that can happen both when she is walking and at rest.  She does not have any sores/ulcers on either foot.  Her foot pain is also confounded by chronic bilateral plantar fasciitis.  We discussed the nature of PAD.  Her symptoms are atypical for this.  I would encourage Dr. Terese Door to consider empiric treatment for diabetic neuropathy, as I do not think that her runoff disease alone explains her symptoms. ? ?I have encouraged Ms. Barbaro on to try to increase her walking as this is often beneficial in PAD.  However, she feels that this will be difficult on account of her plantar fasciitis.  We discussed referral to Dr. Fletcher Anon for vascular consultation but have agreed to defer this for the time being.  If her pain does not improve with treatment of other pathologies such as neuropathy and plantar fasciitis or she develops wounds, we would reconsider vascular consultation with Dr. Fletcher Anon.  Given her history of CAD, we will continue dual antiplatelet therapy with aspirin and clopidogrel as well as aggressive lipid therapy with Repatha and ezetimibe. ? ?Nelva Bush, MD ?[facility] ? ?

## 2021-10-01 NOTE — Progress Notes (Signed)
?  ?Cephas Darby, MD ?[address]  ?[address]  ?[place], [place] [address]  ?Main: [phone]  ?Fax: [phone] ? ? ? ?Gastroenterology Consultation ? ?Referring Provider:     McLean-Scocuzza, Olivia Mackie * ?Primary Care Physician:  McLean-Scocuzza, Nino Glow, MD ?Primary Gastroenterologist:  Dr. Sherri Sear ?Reason for Consultation:     Nausea, vomiting, chronic constipation ?      ? HPI:   ?Shawna Anderson is a 56 y.o. female referred by Dr. Terese Door, Nino Glow, MD  for consultation & management of chronic symptoms of nausea, vomiting, left upper quadrant pain, chronic constipation.  Patient had several hospitalizations and ER visits for nausea and vomiting.  She is diagnosed with mild gastroparesis based on gastric emptying study in 07/2020.  She was on Reglan as needed.  Patient was previously under the care of Dr. Vicente Males and she wanted to switch her care to me.  Patient has history of chronic marijuana use since age of 85.  She also reports severe constipation, has tried MiraLAX and other laxatives, stool softeners with no relief.  Her labs are unrevealing, TSH normal ? ?Follow-up visit 10/01/2021 ?Patient has made a follow-up appointment on request of her PCP due to recent worsening of her GI symptoms which include nausea, vomiting, epigastric burning pain, worsening of constipation, abdominal bloating.  Patient reports that she experiences burning pain that wakes her up from sleep middle of the night.  Patient was seen by her PCP on 09/12/2021 with worsening of the symptoms.  Ozempic has been discontinued.  She is taking Protonix 40 mg twice daily.  She is started on dicyclomine.  Patient reports that her vomiting has improved but continues to have rest of the symptoms.  She has severe constipation, tried Linzess 290 mcg samples in the past, patient did not like the medication because of diarrhea.  Patient states that she has always been a meat eater.  She does not eat vegetables.  She has large cup of  coffee with creamer and sugar daily.  She does not drink adequate amount of water.  She has gained about 8 pounds since stopping Ozempic.  Her most recent LFTs showed mildly elevated AST and ALT. ? ?NSAIDs: None ? ?Antiplts/Anticoagulants/Anti thrombotics: None ? ?GI Procedures:  ?Colonoscopy 07/29/2019 ?- One 5 mm polyp in the distal ascending colon, removed with a cold snare. Resected and retrieved. Clip was placed. ?- The examination was otherwise normal. ? ?Upper endoscopy 07/11/2019 ?- Non-obstructing Schatzki ring. ?- Gastritis. Biopsied. ?- Normal examined duodenum. ? ? ?Past Medical History:  ?Diagnosis Date  ? Anxiety   ? Asthma   ? Bipolar 1 disorder ([place])   ? Bipolar disorder ([place])   ? CAD (coronary artery disease)   ? s/p stent BMS OM Cx  ? Cervical herniated disc 04/12/2016  ? COPD (chronic obstructive pulmonary disease) ([place])   ? Depression   ? Diabetes mellitus with gastroparesis ([place])   ? Diabetes mellitus without complication ([place])   ? Diverticulitis   ? GERD (gastroesophageal reflux disease)   ? Glaucoma   ? History of blood transfusion   ? Hyperlipidemia   ? Hypertension   ? OSA (obstructive sleep apnea)   ? not using cpap   ? Plantar fasciitis   ? b/l feet s/p steroid shots w/o help and left surgery w/o help   ? UTI (urinary tract infection)   ? ? ?Past Surgical History:  ?Procedure Laterality Date  ? ABDOMINAL HYSTERECTOMY    ? ABDOMINAL  SURGERY  1995  ? Bowel resection.  ? CARDIAC CATHETERIZATION N/A 04/14/2016  ? Procedure: Left Heart Cath and Coronary Angiography;  Surgeon: Burnell Blanks, MD;  Location: [facility];  Service: Cardiovascular;  Laterality: N/A;  ? CARDIAC SURGERY    ? COLONOSCOPY WITH PROPOFOL N/A 07/11/2019  ? Procedure: COLONOSCOPY WITH PROPOFOL;  Surgeon: Jonathon Bellows, MD;  Location: [facility] ENDOSCOPY;  Service: Gastroenterology;  Laterality: N/A;  ? COLONOSCOPY WITH PROPOFOL N/A 07/29/2019  ? Procedure: COLONOSCOPY WITH PROPOFOL;  Surgeon: Jonathon Bellows, MD;   Location: [facility] ENDOSCOPY;  Service: Gastroenterology;  Laterality: N/A;  ? CORONARY ANGIOPLASTY WITH STENT PLACEMENT  2010  ? Drug eluting stent  ? ESOPHAGOGASTRODUODENOSCOPY (EGD) WITH PROPOFOL N/A 07/11/2019  ? Procedure: ESOPHAGOGASTRODUODENOSCOPY (EGD) WITH PROPOFOL;  Surgeon: Jonathon Bellows, MD;  Location: [facility] ENDOSCOPY;  Service: Gastroenterology;  Laterality: N/A;  ? LEFT HEART CATH AND CORONARY ANGIOGRAPHY Left 09/11/2020  ? Procedure: LEFT HEART CATH AND CORONARY ANGIOGRAPHY;  Surgeon: Nelva Bush, MD;  Location: [place] [facility];  Service: Cardiovascular;  Laterality: Left;  ? OVARIAN CYST REMOVAL    ? ?Current Outpatient Medications:  ?  albuterol (PROAIR HFA) 108 (90 Base) MCG/ACT inhaler, Inhale 1-2 puffs into the lungs every 4 (four) hours as needed for wheezing or shortness of breath., Disp: 54 g, Rfl: 3 ?  amLODipine (NORVASC) 10 MG tablet, TAKE 1 TABLET BY MOUTH AT  BEDTIME, Disp: 90 tablet, Rfl: 3 ?  aspirin 81 MG chewable tablet, Chew 81 mg by mouth daily., Disp: , Rfl:  ?  atorvastatin (LIPITOR) 80 MG tablet, TAKE 1 TABLET BY MOUTH  DAILY AT 6 PM., Disp: 90 tablet, Rfl: 3 ?  Budeson-Glycopyrrol-Formoterol (BREZTRI AEROSPHERE) 160-9-4.8 MCG/ACT AERO, Inhale 2 puffs into the lungs in the morning and at bedtime. Rinse, Disp: 32.1 g, Rfl: 3 ?  clopidogrel (PLAVIX) 75 MG tablet, TAKE 1 TABLET BY MOUTH  DAILY, Disp: 90 tablet, Rfl: 3 ?  dicyclomine (BENTYL) 20 MG tablet, Take 1 tablet (20 mg total) by mouth 4 (four) times daily -  before meals and at bedtime., Disp: 120 tablet, Rfl: 2 ?  divalproex (DEPAKOTE) 500 MG DR tablet, TAKE 1 TABLET BY MOUTH  TWICE DAILY, Disp: 180 tablet, Rfl: 3 ?  ezetimibe (ZETIA) 10 MG tablet, TAKE 1 TABLET BY MOUTH  DAILY WITH LIPITOR 80 MG AT NIGHT, Disp: 90 tablet, Rfl: 3 ?  isosorbide mononitrate (IMDUR) 30 MG 24 hr tablet, Take 3 tablets (90 mg) by mouth once daily, Disp: 90 tablet, Rfl: 6 ?  methocarbamol (ROBAXIN) 500 MG tablet, Take 1 tablet (500 mg total) by  mouth 2 (two) times daily as needed for muscle spasms., Disp: 180 tablet, Rfl: 1 ?  metoCLOPramide (REGLAN) 5 MG tablet, TAKE 1 TABLET BY MOUTH 4  TIMES DAILY BEFORE MEALS  AND AT BEDTIME, Disp: 360 tablet, Rfl: 2 ?  mometasone (ELOCON) 0.1 % cream, Apply 1 application topically as directed. Qhs 5 days a week Monday through Friday, Disp: 45 g, Rfl: 2 ?  nitroGLYCERIN (NITROSTAT) 0.4 MG SL tablet, DISSOLVE 1 TABLET UNDER  TONGUE EVERY 5 MINUTES AS  NEEDED FOR CHEST PAIN MAX  OF 3 TABLETS IN 15 MINUTES  . CALL 911 AFTER THIRD DOSE, Disp: 75 tablet, Rfl: 4 ?  ondansetron (ZOFRAN-ODT) 4 MG disintegrating tablet, DISSOLVE 1 TABLET ON THE  TONGUE EVERY 8 HOURS AS  NEEDED FOR NAUEA OR  VOMITING, Disp: 270 tablet, Rfl: 1 ?  pantoprazole (PROTONIX) 40 MG tablet, TAKE 1  TABLET BY MOUTH  TWICE DAILY BEFORE MEALS, Disp: 180 tablet, Rfl: 3 ?  promethazine (PHENERGAN) 25 MG tablet, Take 1 tablet (25 mg total) by mouth 2 (two) times daily as needed for nausea or vomiting., Disp: 20 tablet, Rfl: 0 ?  REPATHA SURECLICK 528 MG/ML SOAJ, INJECT '140MG'$  SUBCUTANEOUSLY EVERY 2 WEEKS, Disp: 6 mL, Rfl: 0 ?  topiramate (TOPAMAX) 25 MG tablet, Take 1 tablet (25 mg total) by mouth at bedtime., Disp: 90 tablet, Rfl: 0 ?  olmesartan-hydrochlorothiazide (BENICAR HCT) 20-12.5 MG tablet, Take 1 tablet by mouth daily. In am if BP >130/>80 take 2 pills to equal 40-25 mg qd, Disp: 180 tablet, Rfl: 3 ?No current facility-administered medications for this visit. ? ?Facility-Administered Medications Ordered in Other Visits:  ?  albuterol (PROVENTIL) (2.5 MG/3ML) 0.083% nebulizer solution 2.5 mg, 2.5 mg, Nebulization, Once, Tyler Pita, MD ? ? ? ?Family History  ?Problem Relation Age of Onset  ? CAD Mother   ? Depression Mother   ? Heart disease Mother   ? Hyperlipidemia Mother   ? Hypertension Mother   ? Heart disease Father   ? Alcohol abuse Father   ? Cancer Brother 84  ?     brain  ? CAD Brother   ? Depression Brother   ? Diabetes Brother   ?  Heart disease Brother   ? Hyperlipidemia Brother   ? Lupus Other   ? Sickle cell anemia Other   ?  ? ?Social History  ? ?Tobacco Use  ? Smoking status: Former  ?  Packs/day: 1.00  ?  Years: 30.00  ?  Pack year

## 2021-10-01 NOTE — Telephone Encounter (Signed)
-----   Message from Delorise Jackson, MD sent at 09/28/2021  2:29 AM EDT ----- ?Aneurysms noted on CT head or bulging blood vessels  ?Does she want to see the Dr. At [facility] in [place] we previously discussed or Dr. Estanislado Pandy in [place]?  ?

## 2021-10-01 NOTE — Telephone Encounter (Signed)
Patient scheduled for video visit at 2:40 pm today 10/01/21.  ?

## 2021-10-07 ENCOUNTER — Other Ambulatory Visit (HOSPITAL_COMMUNITY): Payer: Self-pay | Admitting: Interventional Radiology

## 2021-10-07 DIAGNOSIS — I671 Cerebral aneurysm, nonruptured: Secondary | ICD-10-CM

## 2021-10-09 NOTE — Progress Notes (Signed)
Faxed via epic routing

## 2021-10-09 NOTE — Progress Notes (Signed)
Patient is scheduled   

## 2021-10-10 ENCOUNTER — Ambulatory Visit (HOSPITAL_COMMUNITY)
Admission: RE | Admit: 2021-10-10 | Discharge: 2021-10-10 | Disposition: A | Discharge status: Home / Self Care | Payer: Medicare Other | Source: Ambulatory Visit | Attending: Interventional Radiology | Admitting: Interventional Radiology

## 2021-10-10 DIAGNOSIS — I671 Cerebral aneurysm, nonruptured: Secondary | ICD-10-CM

## 2021-10-12 ENCOUNTER — Other Ambulatory Visit: Payer: Self-pay | Admitting: Internal Medicine

## 2021-10-15 ENCOUNTER — Ambulatory Visit (HOSPITAL_COMMUNITY)
Admission: RE | Admit: 2021-10-15 | Discharge: 2021-10-15 | Disposition: A | Discharge status: Home / Self Care | Payer: Medicare Other | Source: Ambulatory Visit | Attending: Interventional Radiology | Admitting: Interventional Radiology

## 2021-10-15 DIAGNOSIS — I671 Cerebral aneurysm, nonruptured: Secondary | ICD-10-CM | POA: Diagnosis not present

## 2021-10-16 ENCOUNTER — Ambulatory Visit (INDEPENDENT_AMBULATORY_CARE_PROVIDER_SITE_OTHER): Payer: Medicare Other

## 2021-10-16 ENCOUNTER — Telehealth (HOSPITAL_COMMUNITY): Payer: Self-pay | Admitting: Radiology

## 2021-10-16 ENCOUNTER — Other Ambulatory Visit: Payer: Self-pay | Admitting: Internal Medicine

## 2021-10-16 ENCOUNTER — Other Ambulatory Visit: Payer: Self-pay | Admitting: Family

## 2021-10-16 VITALS — BP 124/79 | HR 73 | Ht 63.0 in | Wt 185.0 lb

## 2021-10-16 DIAGNOSIS — I25118 Atherosclerotic heart disease of native coronary artery with other forms of angina pectoris: Secondary | ICD-10-CM

## 2021-10-16 DIAGNOSIS — Z0000 Encounter for general adult medical examination without abnormal findings: Secondary | ICD-10-CM | POA: Diagnosis not present

## 2021-10-16 DIAGNOSIS — E785 Hyperlipidemia, unspecified: Secondary | ICD-10-CM

## 2021-10-16 DIAGNOSIS — I2510 Atherosclerotic heart disease of native coronary artery without angina pectoris: Secondary | ICD-10-CM

## 2021-10-16 HISTORY — PX: IR RADIOLOGIST EVAL & MGMT: IMG5224

## 2021-10-16 NOTE — Telephone Encounter (Signed)
Pt called and stated she wants to scheduler "angioplasty" with Dr. Estanislado Pandy for this Friday, 10/18/21. She also states that this has to be done under GA as she wakes up during procedures with moderate sedation. I told her that per Deveshwar's note she was to be scheduled for a cerebral angiogram and he did not put anything in the note about doing it under GA. I told her I needed to get with him and call her back with his definitive plan but that it would NOT be scheduled for this Friday as there is already another anesthesia case booked. She was fine with that. JM ?

## 2021-10-16 NOTE — Progress Notes (Signed)
Subjective:   Shawna Anderson is a 56 y.o. female who presents for Medicare Annual (Subsequent) preventive examination.  Review of Systems    No ROS.  Medicare Wellness Virtual Visit.  Visual/audio telehealth visit, UTA vital signs.   See social history for additional risk factors.   Cardiac Risk Factors include: advanced age (>85men, >72 women);diabetes mellitus;hypertension     Objective:    Today's Vitals   10/16/21 0947  BP: 124/79  Pulse: 73  Weight: 185 lb (83.9 kg)  Height: 5\' 3"  (1.6 m)   Body mass index is 32.77 kg/m.     10/16/2021    9:55 AM 08/27/2021    1:21 PM 04/22/2021    5:00 PM 04/22/2021   10:21 AM 02/19/2021    8:24 PM 02/19/2021    8:00 PM 02/19/2021   12:51 PM  Advanced Directives  Does Patient Have a Medical Advance Directive? Yes Yes No No  No No  Type of Advance Directive Living will Living will       Does patient want to make changes to medical advance directive? No - Patient declined     No - Patient declined No - Patient declined  Would patient like information on creating a medical advance directive?  No - Patient declined No - Patient declined  No - Patient declined      Current Medications (verified) Outpatient Encounter Medications as of 10/16/2021  Medication Sig   albuterol (PROAIR HFA) 108 (90 Base) MCG/ACT inhaler Inhale 1-2 puffs into the lungs every 4 (four) hours as needed for wheezing or shortness of breath.   amLODipine (NORVASC) 10 MG tablet TAKE 1 TABLET BY MOUTH AT  BEDTIME   aspirin 81 MG chewable tablet Chew 81 mg by mouth daily.   atorvastatin (LIPITOR) 80 MG tablet TAKE 1 TABLET BY MOUTH  DAILY AT 6 PM.   Budeson-Glycopyrrol-Formoterol (BREZTRI AEROSPHERE) 160-9-4.8 MCG/ACT AERO Inhale 2 puffs into the lungs in the morning and at bedtime. Rinse   clopidogrel (PLAVIX) 75 MG tablet TAKE 1 TABLET BY MOUTH  DAILY   dicyclomine (BENTYL) 20 MG tablet Take 1 tablet (20 mg total) by mouth 4 (four) times daily -  before meals and at  bedtime.   divalproex (DEPAKOTE) 500 MG DR tablet TAKE 1 TABLET BY MOUTH  TWICE DAILY   ezetimibe (ZETIA) 10 MG tablet TAKE 1 TABLET BY MOUTH  DAILY WITH LIPITOR 80 MG AT NIGHT   isosorbide mononitrate (IMDUR) 30 MG 24 hr tablet Take 3 tablets (90 mg) by mouth once daily   methocarbamol (ROBAXIN) 500 MG tablet Take 1 tablet (500 mg total) by mouth 2 (two) times daily as needed for muscle spasms.   metoCLOPramide (REGLAN) 5 MG tablet TAKE 1 TABLET BY MOUTH 4  TIMES DAILY BEFORE MEALS  AND AT BEDTIME   mometasone (ELOCON) 0.1 % cream Apply 1 application topically as directed. Qhs 5 days a week Monday through Friday   nitroGLYCERIN (NITROSTAT) 0.4 MG SL tablet DISSOLVE 1 TABLET UNDER  TONGUE EVERY 5 MINUTES AS  NEEDED FOR CHEST PAIN MAX  OF 3 TABLETS IN 15 MINUTES  . CALL 911 AFTER THIRD DOSE   olmesartan-hydrochlorothiazide (BENICAR HCT) 20-12.5 MG tablet Take 1 tablet by mouth daily. In am if BP >130/>80 take 2 pills to equal 40-25 mg qd   ondansetron (ZOFRAN-ODT) 4 MG disintegrating tablet DISSOLVE 1 TABLET ON THE  TONGUE EVERY 8 HOURS AS  NEEDED FOR NAUEA OR  VOMITING   pantoprazole (PROTONIX) 40  MG tablet TAKE 1 TABLET BY MOUTH  TWICE DAILY BEFORE MEALS   promethazine (PHENERGAN) 25 MG tablet Take 1 tablet (25 mg total) by mouth 2 (two) times daily as needed for nausea or vomiting.   REPATHA SURECLICK 140 MG/ML SOAJ INJECT 140MG  SUBCUTANEOUSLY  EVERY 2 WEEKS   topiramate (TOPAMAX) 25 MG tablet Take 1 tablet (25 mg total) by mouth at bedtime.   Facility-Administered Encounter Medications as of 10/16/2021  Medication   albuterol (PROVENTIL) (2.5 MG/3ML) 0.083% nebulizer solution 2.5 mg    Allergies (verified) Ativan [lorazepam], Augmentin [amoxicillin-pot clavulanate], Lactose intolerance (gi), Latex, Tape, Xifaxan [rifaximin], and Drixoral allergy sinus [dexbromphen-pse-apap er]   History: Past Medical History:  Diagnosis Date   Anxiety    Asthma    Bipolar 1 disorder ([facility])    Bipolar  disorder ([facility])    CAD (coronary artery disease)    s/p stent BMS OM Cx   Cervical herniated disc 04/12/2016   COPD (chronic obstructive pulmonary disease) ([facility])    Depression    Diabetes mellitus with gastroparesis ([facility])    Diabetes mellitus without complication ([facility])    Diverticulitis    GERD (gastroesophageal reflux disease)    Glaucoma    History of blood transfusion    Hyperlipidemia    Hypertension    OSA (obstructive sleep apnea)    not using cpap    Plantar fasciitis    b/l feet s/p steroid shots w/o help and left surgery w/o help    UTI (urinary tract infection)    Past Surgical History:  Procedure Laterality Date   ABDOMINAL HYSTERECTOMY     ABDOMINAL SURGERY  1995   Bowel resection.   CARDIAC CATHETERIZATION N/A 04/14/2016   Procedure: Left Heart Cath and Coronary Angiography;  Surgeon: Kathleene Hazel, MD;  Location: [facility] INVASIVE CV LAB;  Service: Cardiovascular;  Laterality: N/A;   CARDIAC SURGERY     COLONOSCOPY WITH PROPOFOL N/A 07/11/2019   Procedure: COLONOSCOPY WITH PROPOFOL;  Surgeon: Wyline Mood, MD;  Location: [facility] ENDOSCOPY;  Service: Gastroenterology;  Laterality: N/A;   COLONOSCOPY WITH PROPOFOL N/A 07/29/2019   Procedure: COLONOSCOPY WITH PROPOFOL;  Surgeon: Wyline Mood, MD;  Location: [facility] ENDOSCOPY;  Service: Gastroenterology;  Laterality: N/A;   CORONARY ANGIOPLASTY WITH STENT PLACEMENT  2010   Drug eluting stent   ESOPHAGOGASTRODUODENOSCOPY (EGD) WITH PROPOFOL N/A 07/11/2019   Procedure: ESOPHAGOGASTRODUODENOSCOPY (EGD) WITH PROPOFOL;  Surgeon: Wyline Mood, MD;  Location: [facility] ENDOSCOPY;  Service: Gastroenterology;  Laterality: N/A;   IR RADIOLOGIST EVAL & MGMT  10/16/2021   LEFT HEART CATH AND CORONARY ANGIOGRAPHY Left 09/11/2020   Procedure: LEFT HEART CATH AND CORONARY ANGIOGRAPHY;  Surgeon: Yvonne Kendall, MD;  Location: [facility] INVASIVE CV LAB;  Service: Cardiovascular;  Laterality: Left;   OVARIAN CYST REMOVAL     Family History  Problem  Relation Age of Onset   CAD Mother    Depression Mother    Heart disease Mother    Hyperlipidemia Mother    Hypertension Mother    Heart disease Father    Alcohol abuse Father    Cancer Brother 37       brain   CAD Brother    Depression Brother    Diabetes Brother    Heart disease Brother    Hyperlipidemia Brother    Lupus Other    Sickle cell anemia Other    Social History   Socioeconomic History   Marital status: Single    Spouse name: Not on file  Number of children: 1   Years of education: Not on file   Highest education level: Not on file  Occupational History   Not on file  Tobacco Use   Smoking status: Former    Packs/day: 1.00    Years: 30.00    Pack years: 30.00    Types: Cigarettes    Quit date: 12/31/2020    Years since quitting: 0.7   Smokeless tobacco: Never   Tobacco comments:    Quit after being admitted on 6/20 01/17/2021  Vaping Use   Vaping Use: Former  Substance and Sexual Activity   Alcohol use: Yes    Comment: once a year   Drug use: Not Currently    Types: Marijuana, Cocaine   Sexual activity: Not Currently  Other Topics Concern   Not on file  Social History Narrative   From [place] now living in [place]    1 son    No guns    Wears seat belt   Safe in relationship    Social Determinants of Health   Financial Resource Strain: Low Risk    Difficulty of Paying Living Expenses: Not very hard  Food Insecurity: No Food Insecurity   Worried About Programme researcher, broadcasting/film/video in the Last Year: Never true   Barista in the Last Year: Never true  Transportation Needs: No Transportation Needs   Lack of Transportation (Medical): No   Lack of Transportation (Non-Medical): No  Physical Activity: Not on file  Stress: No Stress Concern Present   Feeling of Stress : Not at all  Social Connections: Unknown   Frequency of Communication with Friends and Family: More than three times a week   Frequency of Social Gatherings with Friends  and Family: More than three times a week   Attends Religious Services: Not on file   Active Member of Clubs or Organizations: Not on file   Attends Banker Meetings: Not on file   Marital Status: Not on file    Tobacco Counseling Counseling given: Not Answered Tobacco comments: Quit after being admitted on 6/20 01/17/2021   Clinical Intake:  Pre-visit preparation completed: Yes        Diabetes: Yes (Followed by PCP)  How often do you need to have someone help you when you read instructions, pamphlets, or other written materials from your doctor or pharmacy?: 1 - Never    Interpreter Needed?: No      Activities of Daily Living    10/16/2021    9:55 AM 04/22/2021    5:00 PM  In your present state of health, do you have any difficulty performing the following activities:  Hearing? 0 0  Vision? 0 0  Difficulty concentrating or making decisions? 0 0  Walking or climbing stairs? 1 0  Dressing or bathing? 0 0  Doing errands, shopping? 0 1  Preparing Food and eating ? N   Using the Toilet? N   In the past six months, have you accidently leaked urine? N   Do you have problems with loss of bowel control? N   Managing your Medications? N   Managing your Finances? N   Housekeeping or managing your Housekeeping? N     Patient Care Team: McLean-Scocuzza, Pasty Spillers, MD as PCP - General (Internal Medicine) End, Cristal Deer, MD as PCP - Cardiology (Cardiology) Creig Hines, MD as Consulting Physician (Oncology)  Indicate any recent Medical Services you may have received from other than [facility]  providers in the past year (date may be approximate).     Assessment:   This is a routine wellness examination for [facility].   Virtual Visit via Telephone Note  I connected with  LIZ BAHAR on 10/16/21 at  9:45 AM EDT by telephone and verified that I am speaking with the correct person using two identifiers.  Persons participating in the virtual visit: patient/Nurse  Health Advisor   I discussed the limitations of performing an evaluation and management service by telehealth. The patient expressed understanding and agreed to proceed. We continued and completed visit with audio only. Some vital signs may be absent or patient reported.   Hearing/Vision screen Hearing Screening - Comments:: Patient is able to hear conversational tones without difficulty.  No issues reported.   Vision Screening - Comments:: Wears corrective lenses    Dietary issues and exercise activities discussed: Current Exercise Habits: The patient does not participate in regular exercise at present Regular diet; monitors Good water intake   Goals Addressed             This Visit's Progress    Healthy Lifestyle       Stay active. Stay hydrated.       Depression Screen    10/16/2021    9:55 AM 11/28/2020   11:39 AM 10/15/2020    9:38 AM 10/25/2019    2:44 PM 10/13/2019    9:39 AM 02/18/2019    2:54 PM 12/29/2017    1:43 PM  PHQ 2/9 Scores  PHQ - 2 Score 0 0 0 1 1 6  0  PHQ- 9 Score      13     Fall Risk    10/16/2021    9:55 AM 01/23/2021    9:34 AM 12/05/2020    9:08 AM 10/15/2020    9:45 AM 10/05/2020    2:46 PM  Fall Risk   Falls in the past year? 0 1 0 0 0  Number falls in past yr: 0 1 0 0 0  Injury with Fall?  0 0 0 0  Risk for fall due to :  History of fall(s)     Follow up Falls evaluation completed Falls evaluation completed Falls evaluation completed Falls evaluation completed Falls evaluation completed    FALL RISK PREVENTION PERTAINING TO THE HOME:  Home free of loose throw rugs in walkways, pet beds, electrical cords, etc? Yes  Adequate lighting in your home to reduce risk of falls? Yes   ASSISTIVE DEVICES UTILIZED TO PREVENT FALLS: Life alert? No  Use of a cane, walker or w/c? No   TIMED UP AND GO: Was the test performed? No .   Cognitive Function: Patient is alert and oriented x3.         10/16/2021   10:14 AM 10/15/2020   10:01 AM 10/13/2019     9:47 AM  6CIT Screen  What Year? 0 points 0 points 0 points  What month? 0 points 0 points 0 points  What time? 0 points 0 points 0 points  Count back from 20   0 points  Months in reverse 0 points 0 points 0 points  Repeat phrase   0 points  Total Score   0 points    Immunizations Immunization History  Administered Date(s) Administered   PFIZER(Purple Top)SARS-COV-2 Vaccination 11/04/2019, 11/18/2019, 04/23/2020   Zoster Recombinat (Shingrix) 06/12/2021   TDAP status: Due, Education has been provided regarding the importance of this vaccine. Advised may receive this vaccine at  local pharmacy or Health Dept. Aware to provide a copy of the vaccination record if obtained from local pharmacy or Health Dept. Verbalized acceptance and understanding.  Screening Tests Health Maintenance  Topic Date Due   HEMOGLOBIN A1C  10/21/2021 (Originally 08/22/2021)   COLONOSCOPY (Pts 45-44yrs Insurance coverage will need to be confirmed)  10/21/2021 (Originally 07/28/2020)   Zoster Vaccines- Shingrix (2 of 2) 10/21/2021 (Originally 08/07/2021)   COVID-19 Vaccine (4 - Booster) 06/13/2022 (Originally 06/18/2020)   TETANUS/TDAP  10/17/2022 (Originally 04/29/1986)   INFLUENZA VACCINE  02/11/2022   FOOT EXAM  06/18/2022   OPHTHALMOLOGY EXAM  09/05/2022   MAMMOGRAM  03/02/2023   Hepatitis C Screening  Completed   HIV Screening  Completed   HPV VACCINES  Aged Out   PAP SMEAR-Modifier  Discontinued   Health Maintenance There are no preventive care reminders to display for this patient.  Colonoscopy- plans to discuss with PCP.  Shingrix vaccine- scheduled to complete series in office.   A1C- labs followed by PCP. Deferred for next scheduled appointment.   Lung Cancer Screening: (Low Dose CT Chest recommended if Age 32-80 years, 30 pack-year currently smoking OR have quit w/in 15years.) does not qualify.   Vision Screening: Recommended annual ophthalmology exams for early detection of glaucoma and  other disorders of the eye.  Dental Screening: Recommended annual dental exams for proper oral hygiene. Partial in place.   Community Resource Referral / Chronic Care Management: CRR required this visit?  No   CCM required this visit?  No      Plan:   Keep all routine maintenance appointments.   I have personally reviewed and noted the following in the patient's chart:   Medical and social history Use of alcohol, tobacco or illicit drugs  Current medications and supplements including opioid prescriptions.  Functional ability and status Nutritional status Physical activity Advanced directives List of other physicians Hospitalizations, surgeries, and ER visits in previous 12 months Vitals Screenings to include cognitive, depression, and falls Referrals and appointments  In addition, I have reviewed and discussed with patient certain preventive protocols, quality metrics, and best practice recommendations. A written personalized care plan for preventive services as well as general preventive health recommendations were provided to patient.     Ashok Pall, LPN   07/19/1094

## 2021-10-16 NOTE — Patient Instructions (Addendum)
?  Shawna Anderson , ?Thank you for taking time to come for your Medicare Wellness Visit. I appreciate your ongoing commitment to your health goals. Please review the following plan we discussed and let me know if I can assist you in the future.  ? ?These are the goals we discussed: ? Goals   ? ?  Healthy Lifestyle   ?  Stay active. ?Stay hydrated. ?  ? ?  ?  ?This is a list of the screening recommended for you and due dates:  ?Health Maintenance  ?Topic Date Due  ? Hemoglobin A1C  10/21/2021*  ? Colon Cancer Screening  10/21/2021*  ? Zoster (Shingles) Vaccine (2 of 2) 10/21/2021*  ? COVID-19 Vaccine (4 - Booster) 06/13/2022*  ? Tetanus Vaccine  10/17/2022*  ? Flu Shot  02/11/2022  ? Complete foot exam   06/18/2022  ? Eye exam for diabetics  09/05/2022  ? Mammogram  03/02/2023  ? Hepatitis C Screening: USPSTF Recommendation to screen - Ages 15-79 yo.  Completed  ? HIV Screening  Completed  ? HPV Vaccine  Aged Out  ? Pap Smear  Discontinued  ?*Topic was postponed. The date shown is not the original due date.  ?  ?

## 2021-10-18 ENCOUNTER — Telehealth (HOSPITAL_COMMUNITY): Payer: Self-pay | Admitting: Radiology

## 2021-10-18 NOTE — Telephone Encounter (Signed)
Called pt to let her know that we have sent a message to her cardiologist and gastroenterologist to see if she requires general anesthesia for her cerebral angiogram as she states she wakes up when only moderate sedation is used. Dr. Estanislado Pandy will not use GA without getting feedback from these physicians. I told her I would call her as soon as we heard back from them. She was in agreement with this plan of care. JM ?

## 2021-10-22 ENCOUNTER — Ambulatory Visit: Payer: Medicare Other

## 2021-10-22 ENCOUNTER — Encounter: Payer: Medicare Other | Admitting: Internal Medicine

## 2021-10-24 ENCOUNTER — Ambulatory Visit: Payer: Medicare Other | Admitting: Dermatology

## 2021-10-25 ENCOUNTER — Ambulatory Visit (INDEPENDENT_AMBULATORY_CARE_PROVIDER_SITE_OTHER): Payer: Medicare Other | Admitting: Internal Medicine

## 2021-10-25 ENCOUNTER — Other Ambulatory Visit (HOSPITAL_COMMUNITY)
Admission: RE | Admit: 2021-10-25 | Discharge: 2021-10-25 | Disposition: A | Discharge status: Home / Self Care | Payer: Medicare Other | Source: Ambulatory Visit | Attending: Internal Medicine | Admitting: Internal Medicine

## 2021-10-25 ENCOUNTER — Encounter: Payer: Self-pay | Admitting: Internal Medicine

## 2021-10-25 VITALS — BP 150/70 | HR 67 | Temp 98.5°F | Resp 14 | Ht 63.0 in | Wt 186.4 lb

## 2021-10-25 DIAGNOSIS — Z0000 Encounter for general adult medical examination without abnormal findings: Secondary | ICD-10-CM | POA: Diagnosis not present

## 2021-10-25 DIAGNOSIS — I10 Essential (primary) hypertension: Secondary | ICD-10-CM | POA: Diagnosis not present

## 2021-10-25 DIAGNOSIS — Z124 Encounter for screening for malignant neoplasm of cervix: Secondary | ICD-10-CM

## 2021-10-25 DIAGNOSIS — R7303 Prediabetes: Secondary | ICD-10-CM

## 2021-10-25 DIAGNOSIS — I729 Aneurysm of unspecified site: Secondary | ICD-10-CM | POA: Diagnosis not present

## 2021-10-25 DIAGNOSIS — Z1151 Encounter for screening for human papillomavirus (HPV): Secondary | ICD-10-CM | POA: Diagnosis not present

## 2021-10-25 DIAGNOSIS — Z01419 Encounter for gynecological examination (general) (routine) without abnormal findings: Secondary | ICD-10-CM | POA: Diagnosis present

## 2021-10-25 DIAGNOSIS — Z23 Encounter for immunization: Secondary | ICD-10-CM

## 2021-10-25 DIAGNOSIS — Z1231 Encounter for screening mammogram for malignant neoplasm of breast: Secondary | ICD-10-CM | POA: Diagnosis not present

## 2021-10-25 LAB — POCT GLYCOSYLATED HEMOGLOBIN (HGB A1C): Hemoglobin A1C: 6.3 % — AB (ref 4.0–5.6)

## 2021-10-25 MED ORDER — TETANUS-DIPHTH-ACELL PERTUSSIS 5-2.5-18.5 LF-MCG/0.5 IM SUSP: 0.5000 mL | Freq: Once | INTRAMUSCULAR | 0 refills | Status: AC

## 2021-10-25 NOTE — Patient Instructions (Addendum)
Align probiotics  ?IB Guard or FD Guard  ? ? ?Low-FODMAP Eating Plan ? ?FODMAP stands for fermentable oligosaccharides, disaccharides, monosaccharides, and polyols. These are sugars that are hard for some people to digest. A low-FODMAP eating plan may help some people who have irritable bowel syndrome (IBS) and certain other bowel (intestinal) diseases to manage their symptoms. ?This meal plan can be complicated to follow. Work with a diet and nutrition specialist (dietitian) to make a low-FODMAP eating plan that is right for you. A dietitian can help make sure that you get enough nutrition from this diet. ?What are tips for following this plan? ?Reading food labels ?Check labels for hidden FODMAPs such as: ?High-fructose syrup. ?Honey. ?Agave. ?Natural fruit flavors. ?Onion or garlic powder. ?Choose low-FODMAP foods that contain 3-4 grams of fiber per serving. ?Check food labels for serving sizes. Eat only one serving at a time to make sure FODMAP levels stay low. ?Shopping ?Shop with a list of foods that are recommended on this diet and make a meal plan. ?Meal planning ?Follow a low-FODMAP eating plan for up to 6 weeks, or as told by your health care provider or dietitian. ?To follow the eating plan: ?Eliminate high-FODMAP foods from your diet completely. Choose only low-FODMAP foods to eat. You will do this for 2-6 weeks. ?Gradually reintroduce high-FODMAP foods into your diet one at a time. Most people should wait a few days before introducing the next new high-FODMAP food into their meal plan. Your dietitian can recommend how quickly you may reintroduce foods. ?Keep a daily record of what and how much you eat and drink. Make note of any symptoms that you have after eating. ?Review your daily record with a dietitian regularly to identify which foods you can eat and which foods you should avoid. ?General tips ?Drink enough fluid each day to keep your urine pale yellow. ?Avoid processed foods. These often have  added sugar and may be high in FODMAPs. ?Avoid most dairy products, whole grains, and sweeteners. ?Work with a dietitian to make sure you get enough fiber in your diet. ?Avoid high FODMAP foods at meals to manage symptoms. ?Recommended foods ?Fruits ?Bananas, oranges, tangerines, lemons, limes, blueberries, raspberries, strawberries, grapes, cantaloupe, honeydew melon, kiwi, papaya, passion fruit, and pineapple. Limited amounts of dried cranberries, banana chips, and shredded coconut. ?Vegetables ?Eggplant, zucchini, cucumber, peppers, green beans, bean sprouts, lettuce, arugula, kale, Swiss chard, spinach, collard greens, bok choy, summer squash, potato, and tomato. Limited amounts of corn, carrot, and sweet potato. Green parts of scallions. ?Grains ?Gluten-free grains, such as rice, oats, buckwheat, quinoa, corn, polenta, and millet. Gluten-free pasta, bread, or cereal. Rice noodles. Corn tortillas. ?Meats and other proteins ?Unseasoned beef, pork, poultry, or fish. Eggs. Berniece Salines. Tofu (firm) and tempeh. Limited amounts of nuts and seeds, such as almonds, walnuts, [place] nuts, pecans, peanuts, nut butters, pumpkin seeds, chia seeds, and sunflower seeds. ?Dairy ?Lactose-free milk, yogurt, and kefir. Lactose-free cottage cheese and ice cream. Non-dairy milks, such as almond, coconut, hemp, and rice milk. Non-dairy yogurt. Limited amounts of goat cheese, brie, mozzarella, parmesan, swiss, and other hard cheeses. ?Fats and oils ?Butter-free spreads. Vegetable oils, such as olive, canola, and sunflower oil. ?Seasoning and other foods ?Artificial sweeteners with names that do not end in "ol," such as aspartame, saccharine, and stevia. Maple syrup, white table sugar, raw sugar, brown sugar, and molasses. Mayonnaise, soy sauce, and tamari. Fresh basil, coriander, parsley, rosemary, and thyme. ?Beverages ?Water and mineral water. Sugar-sweetened soft drinks. Small amounts of orange  juice or cranberry juice. Black and green  tea. Most dry wines. Coffee. ?The items listed above may not be a complete list of foods and beverages you can eat. Contact a dietitian for more information. ?Foods to avoid ?Fruits ?Fresh, dried, and juiced forms of apple, pear, watermelon, peach, plum, cherries, apricots, blackberries, boysenberries, figs, nectarines, and mango. Avocado. ?Vegetables ?Chicory root, artichoke, asparagus, cabbage, snow peas, Brussels sprouts, broccoli, sugar snap peas, mushrooms, celery, and cauliflower. Onions, garlic, leeks, and the white part of scallions. ?Grains ?Wheat, including kamut, durum, and semolina. Barley and bulgur. Couscous. Wheat-based cereals. Wheat noodles, bread, crackers, and pastries. ?Meats and other proteins ?Fried or fatty meat. Sausage. Cashews and pistachios. Soybeans, baked beans, black beans, chickpeas, kidney beans, fava beans, navy beans, lentils, black-eyed peas, and split peas. ?Dairy ?Milk, yogurt, ice cream, and soft cheese. Cream and sour cream. Milk-based sauces. Custard. Buttermilk. Soy milk. ?Seasoning and other foods ?Any sugar-free gum or candy. Foods that contain artificial sweeteners such as sorbitol, mannitol, isomalt, or xylitol. Foods that contain honey, high-fructose corn syrup, or agave. Bouillon, vegetable stock, beef stock, and chicken stock. Garlic and onion powder. Condiments made with onion, such as hummus, chutney, pickles, relish, salad dressing, and salsa. Tomato paste. ?Beverages ?Chicory-based drinks. Coffee substitutes. Chamomile tea. Fennel tea. Sweet or fortified wines such as port or sherry. Diet soft drinks made with isomalt, mannitol, maltitol, sorbitol, or xylitol. Apple, pear, and mango juice. Juices with high-fructose corn syrup. ?The items listed above may not be a complete list of foods and beverages you should avoid. Contact a dietitian for more information. ?Summary ?FODMAP stands for fermentable oligosaccharides, disaccharides, monosaccharides, and polyols. These  are sugars that are hard for some people to digest. ?A low-FODMAP eating plan is a short-term diet that helps to ease symptoms of certain bowel diseases. ?The eating plan usually lasts up to 6 weeks. After that, high-FODMAP foods are reintroduced gradually and one at a time. This can help you find out which foods may be causing symptoms. ?A low-FODMAP eating plan can be complicated. It is best to work with a dietitian who has experience with this type of plan. ?This information is not intended to replace advice given to you by your health care provider. Make sure you discuss any questions you have with your health care provider. ?Document Revised: 11/17/2019 Document Reviewed: 11/17/2019 ?Elsevier Patient Education ? [place]. ? ?Abdominal Bloating ?When you have abdominal bloating, your abdomen may feel full, tight, or painful. It may also look bigger than normal or swollen (distended). Common causes of abdominal bloating include: ?Swallowing air. ?Constipation. ?Problems digesting food. ?Eating too much. ?Irritable bowel syndrome. This is a condition that affects the large intestine. ?Lactose intolerance. This is an inability to digest lactose, a natural sugar in dairy products. ?Celiac disease. This is a condition that affects the ability to digest gluten, a protein found in some grains. ?Gastroparesis. This is a condition that slows down the movement of food in the stomach and small intestine. It is more common in people with diabetes mellitus. ?Gastroesophageal reflux disease (GERD). This is a condition that makes stomach acid flow back into the esophagus. ?Urinary retention. This means that the body is holding onto urine, and the bladder cannot be emptied all the way. ?Follow these instructions at home: ?Eating and drinking ?Avoid eating too much. ?Try not to swallow air while talking or eating. ?Avoid eating while lying down. ?Avoid these foods and drinks: ?Foods that cause gas, such as broccoli,  cabbage, cauliflower, and baked beans. ?Carbonated drinks. ?Hard candy. ?Chewing gum. ?Medicines ?Take over-the-counter and prescription medicines only as told by your health care provider. ?Take probiotic

## 2021-10-25 NOTE — Progress Notes (Signed)
Chief Complaint  ?Patient presents with  ? Annual Exam  ?  Non fasting, ate 2 biscuits.  ? ?Annual  ?1. Htn sl elevated today no meds on norvasc 10 mg qd and benicar 20-12.5 qd and if BP >130/>80 she takes 2 total for the day per cardiology ? ?Review of Systems  ?Constitutional:  Negative for weight loss.  ?HENT:  Negative for hearing loss.   ?Eyes:  Negative for blurred vision.  ?Respiratory:  Negative for shortness of breath.   ?Cardiovascular:  Negative for chest pain.  ?Gastrointestinal:  Negative for abdominal pain and blood in stool.  ?Genitourinary:  Negative for dysuria.  ?Musculoskeletal:  Negative for falls and joint pain.  ?Skin:  Negative for rash.  ?Neurological:  Negative for headaches.  ?Psychiatric/Behavioral:  Negative for depression.   ?Past Medical History:  ?Diagnosis Date  ? Anxiety   ? Asthma   ? Bipolar 1 disorder ([place])   ? Bipolar disorder ([place])   ? CAD (coronary artery disease)   ? s/p stent BMS OM Cx  ? Cervical herniated disc 04/12/2016  ? COPD (chronic obstructive pulmonary disease) ([place])   ? Depression   ? Diabetes mellitus with gastroparesis ([place])   ? Diabetes mellitus without complication ([place])   ? Diverticulitis   ? GERD (gastroesophageal reflux disease)   ? Glaucoma   ? History of blood transfusion   ? Hyperlipidemia   ? Hypertension   ? OSA (obstructive sleep apnea)   ? not using cpap   ? Plantar fasciitis   ? b/l feet s/p steroid shots w/o help and left surgery w/o help   ? UTI (urinary tract infection)   ? ?Past Surgical History:  ?Procedure Laterality Date  ? ABDOMINAL HYSTERECTOMY    ? ABDOMINAL SURGERY  1995  ? Bowel resection.  ? CARDIAC CATHETERIZATION N/A 04/14/2016  ? Procedure: Left Heart Cath and Coronary Angiography;  Surgeon: Burnell Blanks, MD;  Location: [facility];  Service: Cardiovascular;  Laterality: N/A;  ? CARDIAC SURGERY    ? COLONOSCOPY WITH PROPOFOL N/A 07/11/2019  ? Procedure: COLONOSCOPY WITH PROPOFOL;  Surgeon: Jonathon Bellows, MD;  Location:  [facility] ENDOSCOPY;  Service: Gastroenterology;  Laterality: N/A;  ? COLONOSCOPY WITH PROPOFOL N/A 07/29/2019  ? Procedure: COLONOSCOPY WITH PROPOFOL;  Surgeon: Jonathon Bellows, MD;  Location: [facility] ENDOSCOPY;  Service: Gastroenterology;  Laterality: N/A;  ? CORONARY ANGIOPLASTY WITH STENT PLACEMENT  2010  ? Drug eluting stent  ? ESOPHAGOGASTRODUODENOSCOPY (EGD) WITH PROPOFOL N/A 07/11/2019  ? Procedure: ESOPHAGOGASTRODUODENOSCOPY (EGD) WITH PROPOFOL;  Surgeon: Jonathon Bellows, MD;  Location: [facility] ENDOSCOPY;  Service: Gastroenterology;  Laterality: N/A;  ? IR RADIOLOGIST EVAL & MGMT  10/16/2021  ? LEFT HEART CATH AND CORONARY ANGIOGRAPHY Left 09/11/2020  ? Procedure: LEFT HEART CATH AND CORONARY ANGIOGRAPHY;  Surgeon: Nelva Bush, MD;  Location: [facility];  Service: Cardiovascular;  Laterality: Left;  ? OVARIAN CYST REMOVAL    ? ?Family History  ?Problem Relation Age of Onset  ? CAD Mother   ? Depression Mother   ? Heart disease Mother   ? Hyperlipidemia Mother   ? Hypertension Mother   ? Heart disease Father   ? Alcohol abuse Father   ? Cancer Brother 75  ?     brain  ? CAD Brother   ? Depression Brother   ? Diabetes Brother   ? Heart disease Brother   ? Hyperlipidemia Brother   ? Lupus Other   ? Sickle cell anemia Other   ? ?  Social History  ? ?Socioeconomic History  ? Marital status: Single  ?  Spouse name: Not on file  ? Number of children: 1  ? Years of education: Not on file  ? Highest education level: Not on file  ?Occupational History  ? Not on file  ?Tobacco Use  ? Smoking status: Former  ?  Packs/day: 1.00  ?  Years: 30.00  ?  Pack years: 30.00  ?  Types: Cigarettes  ?  Quit date: 12/31/2020  ?  Years since quitting: 0.8  ? Smokeless tobacco: Never  ? Tobacco comments:  ?  Quit after being admitted on 6/20 01/17/2021  ?Vaping Use  ? Vaping Use: Former  ?Substance and Sexual Activity  ? Alcohol use: Yes  ?  Comment: once a year  ? Drug use: Not Currently  ?  Types: Marijuana, Cocaine  ? Sexual activity: Not  Currently  ?Other Topics Concern  ? Not on file  ?Social History Narrative  ? From [place] now living in [place]   ? 1 son   ? No guns   ? Wears seat belt  ? Safe in relationship   ? ?Social Determinants of Health  ? ?Financial Resource Strain: Low Risk   ? Difficulty of Paying Living Expenses: Not very hard  ?Food Insecurity: No Food Insecurity  ? Worried About Charity fundraiser in the Last Year: Never true  ? Ran Out of Food in the Last Year: Never true  ?Transportation Needs: No Transportation Needs  ? Lack of Transportation (Medical): No  ? Lack of Transportation (Non-Medical): No  ?Physical Activity: Not on file  ?Stress: No Stress Concern Present  ? Feeling of Stress : Not at all  ?Social Connections: Unknown  ? Frequency of Communication with Friends and Family: More than three times a week  ? Frequency of Social Gatherings with Friends and Family: More than three times a week  ? Attends Religious Services: Not on file  ? Active Member of Clubs or Organizations: Not on file  ? Attends Archivist Meetings: Not on file  ? Marital Status: Not on file  ?Intimate Partner Violence: Not At Risk  ? Fear of Current or Ex-Partner: No  ? Emotionally Abused: No  ? Physically Abused: No  ? Sexually Abused: No  ? ?Current Meds  ?Medication Sig  ? albuterol (PROAIR HFA) 108 (90 Base) MCG/ACT inhaler Inhale 1-2 puffs into the lungs every 4 (four) hours as needed for wheezing or shortness of breath.  ? amLODipine (NORVASC) 10 MG tablet TAKE 1 TABLET BY MOUTH AT  BEDTIME  ? aspirin 81 MG chewable tablet Chew 81 mg by mouth daily.  ? atorvastatin (LIPITOR) 80 MG tablet TAKE 1 TABLET BY MOUTH  DAILY AT 6 PM.  ? Budeson-Glycopyrrol-Formoterol (BREZTRI AEROSPHERE) 160-9-4.8 MCG/ACT AERO Inhale 2 puffs into the lungs in the morning and at bedtime. Rinse  ? clopidogrel (PLAVIX) 75 MG tablet TAKE 1 TABLET BY MOUTH  DAILY  ? dicyclomine (BENTYL) 20 MG tablet Take 1 tablet (20 mg total) by mouth 4 (four) times  daily -  before meals and at bedtime.  ? divalproex (DEPAKOTE) 500 MG DR tablet TAKE 1 TABLET BY MOUTH  TWICE DAILY  ? ezetimibe (ZETIA) 10 MG tablet TAKE 1 TABLET BY MOUTH  DAILY WITH LIPITOR 80 MG AT NIGHT  ? isosorbide mononitrate (IMDUR) 30 MG 24 hr tablet Take 3 tablets (90 mg) by mouth once daily  ? methocarbamol (ROBAXIN) 500  MG tablet Take 1 tablet (500 mg total) by mouth 2 (two) times daily as needed for muscle spasms.  ? metoCLOPramide (REGLAN) 5 MG tablet TAKE 1 TABLET BY MOUTH 4  TIMES DAILY BEFORE MEALS  AND AT BEDTIME  ? mometasone (ELOCON) 0.1 % cream Apply 1 application topically as directed. Qhs 5 days a week Monday through Friday  ? nitroGLYCERIN (NITROSTAT) 0.4 MG SL tablet DISSOLVE 1 TABLET UNDER  TONGUE EVERY 5 MINUTES AS  NEEDED FOR CHEST PAIN MAX  OF 3 TABLETS IN 15 MINUTES  . CALL 911 AFTER THIRD DOSE  ? olmesartan-hydrochlorothiazide (BENICAR HCT) 20-12.5 MG tablet Take 1 tablet by mouth daily. In am if BP >130/>80 take 2 pills to equal 40-25 mg qd  ? ondansetron (ZOFRAN-ODT) 4 MG disintegrating tablet DISSOLVE 1 TABLET ON THE  TONGUE EVERY 8 HOURS AS  NEEDED FOR NAUEA OR  VOMITING  ? pantoprazole (PROTONIX) 40 MG tablet TAKE 1 TABLET BY MOUTH  TWICE DAILY BEFORE MEALS  ? promethazine (PHENERGAN) 25 MG tablet Take 1 tablet (25 mg total) by mouth 2 (two) times daily as needed for nausea or vomiting.  ? REPATHA SURECLICK 637 MG/ML SOAJ INJECT '140MG'$  SUBCUTANEOUSLY  EVERY 2 WEEKS  ? [EXPIRED] Tdap (BOOSTRIX) 5-2.5-18.5 LF-MCG/0.5 injection Inject 0.5 mLs into the muscle once for 1 dose.  ? topiramate (TOPAMAX) 25 MG tablet Take 1 tablet (25 mg total) by mouth at bedtime.  ? ?Allergies  ?Allergen Reactions  ? Ativan [Lorazepam]   ?  Pt states it makes her tongue do weird things   ? Augmentin [Amoxicillin-Pot Clavulanate]   ?  Upset stomach ?  ? Lactose Intolerance (Gi)   ? Latex Other (See Comments)  ?  Patient stated that she was told by her doctor that she is "allergic to" this  ? Tape Other  (See Comments)  ?  Patient stated that she was told by her doctor that she is "allergic to" this  ? Xifaxan [Rifaximin]   ?  Pt believes this is contributing to her abdominal pain/discomfort  ? Drixoral Allerg

## 2021-10-28 ENCOUNTER — Encounter: Payer: Self-pay | Admitting: Family

## 2021-10-28 ENCOUNTER — Ambulatory Visit (INDEPENDENT_AMBULATORY_CARE_PROVIDER_SITE_OTHER): Payer: Medicare Other | Admitting: Family

## 2021-10-28 ENCOUNTER — Telehealth: Payer: Self-pay | Admitting: Internal Medicine

## 2021-10-28 ENCOUNTER — Encounter: Payer: Self-pay | Admitting: Internal Medicine

## 2021-10-28 VITALS — BP 116/72 | HR 84 | Temp 98.1°F | Ht 63.0 in | Wt 185.4 lb

## 2021-10-28 DIAGNOSIS — L03113 Cellulitis of right upper limb: Secondary | ICD-10-CM

## 2021-10-28 DIAGNOSIS — L0390 Cellulitis, unspecified: Secondary | ICD-10-CM | POA: Insufficient documentation

## 2021-10-28 DIAGNOSIS — R7303 Prediabetes: Secondary | ICD-10-CM | POA: Insufficient documentation

## 2021-10-28 MED ORDER — CEPHALEXIN 500 MG PO CAPS: 500.0000 mg | ORAL_CAPSULE | Freq: Four times a day (QID) | ORAL | 0 refills | Status: DC

## 2021-10-28 MED ORDER — HYDROCODONE-ACETAMINOPHEN 5-325 MG PO TABS: 1.0000 | ORAL_TABLET | Freq: Two times a day (BID) | ORAL | 0 refills | Status: DC | PRN

## 2021-10-28 NOTE — Addendum Note (Signed)
Addended by: Neva Seat, Elita Quick C on: 10/28/2021 10:10 AM ? ? Modules accepted: Orders ? ?

## 2021-10-28 NOTE — Progress Notes (Signed)
? ?Subjective:  ? ? Patient ID: Shawna Anderson, female    DOB: 28-Sep-1965, 56 y.o.   MRN: [id number] ? ?CC: Shawna Anderson is a 56 y.o. female who presents today for an acute visit.   ? ?HPI: Right arm pain and redness from injection site.  ?Woke up the next day and area was larger and red. It is warm to the touch and itchy under the skin.  ?She has taken tylenol and ibuprofen without relief.  ?She had fatigue for 24 hours after vaccine.  ?No fever, vomiting, HA.  ? ?2/2 Shingrex vaccine 3 days ago. No previous reaction to shingrex vaccine.  ? ? ?No h/o mrsa ?No h/o ckd ?She has taken tramadol but gets dizzy.  She is taking hydrocodone/acetaminophen without side effects in the past. ? ? ?HISTORY:  ?Past Medical History:  ?Diagnosis Date  ? Anxiety   ? Asthma   ? Bipolar 1 disorder ([place])   ? Bipolar disorder ([place])   ? CAD (coronary artery disease)   ? s/p stent BMS OM Cx  ? Cervical herniated disc 04/12/2016  ? COPD (chronic obstructive pulmonary disease) ([place])   ? Depression   ? Diabetes mellitus with gastroparesis ([place])   ? Diabetes mellitus without complication ([place])   ? Diverticulitis   ? GERD (gastroesophageal reflux disease)   ? Glaucoma   ? History of blood transfusion   ? Hyperlipidemia   ? Hypertension   ? OSA (obstructive sleep apnea)   ? not using cpap   ? Plantar fasciitis   ? b/l feet s/p steroid shots w/o help and left surgery w/o help   ? UTI (urinary tract infection)   ? ?Past Surgical History:  ?Procedure Laterality Date  ? ABDOMINAL HYSTERECTOMY    ? ABDOMINAL SURGERY  1995  ? Bowel resection.  ? CARDIAC CATHETERIZATION N/A 04/14/2016  ? Procedure: Left Heart Cath and Coronary Angiography;  Surgeon: Burnell Blanks, MD;  Location: [place] [facility];  Service: Cardiovascular;  Laterality: N/A;  ? CARDIAC SURGERY    ? COLONOSCOPY WITH PROPOFOL N/A 07/11/2019  ? Procedure: COLONOSCOPY WITH PROPOFOL;  Surgeon: Jonathon Bellows, MD;  Location: [facility] ENDOSCOPY;  Service: Gastroenterology;   Laterality: N/A;  ? COLONOSCOPY WITH PROPOFOL N/A 07/29/2019  ? Procedure: COLONOSCOPY WITH PROPOFOL;  Surgeon: Jonathon Bellows, MD;  Location: [facility] ENDOSCOPY;  Service: Gastroenterology;  Laterality: N/A;  ? CORONARY ANGIOPLASTY WITH STENT PLACEMENT  2010  ? Drug eluting stent  ? ESOPHAGOGASTRODUODENOSCOPY (EGD) WITH PROPOFOL N/A 07/11/2019  ? Procedure: ESOPHAGOGASTRODUODENOSCOPY (EGD) WITH PROPOFOL;  Surgeon: Jonathon Bellows, MD;  Location: [facility] ENDOSCOPY;  Service: Gastroenterology;  Laterality: N/A;  ? IR RADIOLOGIST EVAL & MGMT  10/16/2021  ? LEFT HEART CATH AND CORONARY ANGIOGRAPHY Left 09/11/2020  ? Procedure: LEFT HEART CATH AND CORONARY ANGIOGRAPHY;  Surgeon: Nelva Bush, MD;  Location: [place] [facility];  Service: Cardiovascular;  Laterality: Left;  ? OVARIAN CYST REMOVAL    ? ?Family History  ?Problem Relation Age of Onset  ? CAD Mother   ? Depression Mother   ? Heart disease Mother   ? Hyperlipidemia Mother   ? Hypertension Mother   ? Heart disease Father   ? Alcohol abuse Father   ? Cancer Brother 68  ?     brain  ? CAD Brother   ? Depression Brother   ? Diabetes Brother   ? Heart disease Brother   ? Hyperlipidemia Brother   ? Lupus Other   ? Sickle cell  anemia Other   ? ? ?Allergies: Ativan [lorazepam], Augmentin [amoxicillin-pot clavulanate], Lactose intolerance (gi), Latex, Tape, Xifaxan [rifaximin], and Drixoral allergy sinus [dexbromphen-pse-apap er] ?Current Outpatient Medications on File Prior to Visit  ?Medication Sig Dispense Refill  ? albuterol (PROAIR HFA) 108 (90 Base) MCG/ACT inhaler Inhale 1-2 puffs into the lungs every 4 (four) hours as needed for wheezing or shortness of breath. 54 g 3  ? amLODipine (NORVASC) 10 MG tablet TAKE 1 TABLET BY MOUTH AT  BEDTIME 90 tablet 3  ? aspirin 81 MG chewable tablet Chew 81 mg by mouth daily.    ? atorvastatin (LIPITOR) 80 MG tablet TAKE 1 TABLET BY MOUTH  DAILY AT 6 PM. 90 tablet 3  ? Budeson-Glycopyrrol-Formoterol (BREZTRI AEROSPHERE) 160-9-4.8 MCG/ACT  AERO Inhale 2 puffs into the lungs in the morning and at bedtime. Rinse 32.1 g 3  ? clopidogrel (PLAVIX) 75 MG tablet TAKE 1 TABLET BY MOUTH  DAILY 90 tablet 3  ? dicyclomine (BENTYL) 20 MG tablet Take 1 tablet (20 mg total) by mouth 4 (four) times daily -  before meals and at bedtime. 120 tablet 2  ? divalproex (DEPAKOTE) 500 MG DR tablet TAKE 1 TABLET BY MOUTH  TWICE DAILY 180 tablet 3  ? ezetimibe (ZETIA) 10 MG tablet TAKE 1 TABLET BY MOUTH  DAILY WITH LIPITOR 80 MG AT NIGHT 90 tablet 3  ? isosorbide mononitrate (IMDUR) 30 MG 24 hr tablet Take 3 tablets (90 mg) by mouth once daily 90 tablet 6  ? methocarbamol (ROBAXIN) 500 MG tablet Take 1 tablet (500 mg total) by mouth 2 (two) times daily as needed for muscle spasms. 180 tablet 1  ? metoCLOPramide (REGLAN) 5 MG tablet TAKE 1 TABLET BY MOUTH 4  TIMES DAILY BEFORE MEALS  AND AT BEDTIME 360 tablet 2  ? mometasone (ELOCON) 0.1 % cream Apply 1 application topically as directed. Qhs 5 days a week Monday through Friday 45 g 2  ? nitroGLYCERIN (NITROSTAT) 0.4 MG SL tablet DISSOLVE 1 TABLET UNDER  TONGUE EVERY 5 MINUTES AS  NEEDED FOR CHEST PAIN MAX  OF 3 TABLETS IN 15 MINUTES  . CALL 911 AFTER THIRD DOSE 25 tablet 4  ? olmesartan-hydrochlorothiazide (BENICAR HCT) 20-12.5 MG tablet Take 1 tablet by mouth daily. In am if BP >130/>80 take 2 pills to equal 40-25 mg qd 180 tablet 3  ? ondansetron (ZOFRAN-ODT) 4 MG disintegrating tablet DISSOLVE 1 TABLET ON THE  TONGUE EVERY 8 HOURS AS  NEEDED FOR NAUEA OR  VOMITING 270 tablet 1  ? pantoprazole (PROTONIX) 40 MG tablet TAKE 1 TABLET BY MOUTH  TWICE DAILY BEFORE MEALS 180 tablet 3  ? promethazine (PHENERGAN) 25 MG tablet Take 1 tablet (25 mg total) by mouth 2 (two) times daily as needed for nausea or vomiting. 20 tablet 0  ? REPATHA SURECLICK 956 MG/ML SOAJ INJECT '140MG'$  SUBCUTANEOUSLY  EVERY 2 WEEKS 6 mL 1  ? topiramate (TOPAMAX) 25 MG tablet Take 1 tablet (25 mg total) by mouth at bedtime. 90 tablet 0  ? ?Current  Facility-Administered Medications on File Prior to Visit  ?Medication Dose Route Frequency Provider Last Rate Last Admin  ? albuterol (PROVENTIL) (2.5 MG/3ML) 0.083% nebulizer solution 2.5 mg  2.5 mg Nebulization Once Tyler Pita, MD      ? ? ?Social History  ? ?Tobacco Use  ? Smoking status: Former  ?  Packs/day: 1.00  ?  Years: 30.00  ?  Pack years: 30.00  ?  Types: Cigarettes  ?  Quit date: 12/31/2020  ?  Years since quitting: 0.8  ? Smokeless tobacco: Never  ? Tobacco comments:  ?  Quit after being admitted on 6/20 01/17/2021  ?Vaping Use  ? Vaping Use: Former  ?Substance Use Topics  ? Alcohol use: Yes  ?  Comment: once a year  ? Drug use: Not Currently  ?  Types: Marijuana, Cocaine  ? ? ?Review of Systems  ?Constitutional:  Negative for chills and fever.  ?Respiratory:  Negative for cough.   ?Cardiovascular:  Negative for chest pain and palpitations.  ?Gastrointestinal:  Negative for nausea and vomiting.  ?Skin:  Positive for color change. Negative for wound.  ?   ?Objective:  ?  ?BP 116/72 (BP Location: Left Arm, Patient Position: Sitting, Cuff Size: Normal)   Pulse 84   Temp 98.1 ?F (36.7 ?C) (Oral)   Ht '5\' 3"'$  (1.6 m)   Wt 185 lb 6.4 oz (84.1 kg)   SpO2 98%   BMI 32.84 kg/m?  ? ? ?Physical Exam ?Vitals reviewed.  ?Constitutional:   ?   Appearance: She is well-developed.  ?Eyes:  ?   Conjunctiva/sclera: Conjunctivae normal.  ?Cardiovascular:  ?   Rate and Rhythm: Normal rate and regular rhythm.  ?   Pulses: Normal pulses.  ?   Heart sounds: Normal heart sounds.  ?Pulmonary:  ?   Effort: Pulmonary effort is normal.  ?   Breath sounds: Normal breath sounds. No wheezing, rhonchi or rales.  ?Skin: ?   General: Skin is warm and dry.  ?   Findings: Ecchymosis and erythema present.  ? ?    ?   Comments: Patient grimacing, holding right arm.  ?Raised area of erythema, increased heat and tenderness of the circular area right arm approx 7cm in diameter.  Small area of ecchymosis approximately 6:00 oclock.   skin is intact.  Nonfluctuant.  No red streaks, blisters,  or papules. No edema. ?  ?Neurological:  ?   Mental Status: She is alert.  ?Psychiatric:     ?   Speech: Speech normal.     ?   Behavior: Behavior normal.     ?   Thought

## 2021-10-28 NOTE — Telephone Encounter (Signed)
Pt called stating the pharmacy does not have the hydrocodone pills and pt want it sent to the old pharmacy instead [facility] in [address]  ?

## 2021-10-28 NOTE — Patient Instructions (Addendum)
Start icing arm to reduce inflammation ? ?Start keflex to cover for suspected skin infection ? ?I have given you 5 tablets of hydrocodone acetaminophen to take for severe pain over the next few days.  Please use this sparingly and do not take with alcohol or other sedating medications ? ?Ensure to take probiotics while on antibiotics and also for 2 weeks after completion. This can either be by eating yogurt daily or taking a probiotic supplement over the counter such as Culturelle.It is important to re-colonize the gut with good bacteria and also to prevent any diarrheal infections associated with antibiotic use.  ? ?Please let me know how you are doing and if redness, heat does not rapidly resolve ? ?Cellulitis, Adult ? ?Cellulitis is a skin infection. The infected area is usually warm, red, swollen, and tender. This condition occurs most often in the arms and lower legs. The infection can travel to the muscles, blood, and underlying tissue and become serious. It is very important to get treated for this condition. ?What are the causes? ?Cellulitis is caused by bacteria. The bacteria enter through a break in the skin, such as a cut, burn, insect bite, open sore, or crack. ?What increases the risk? ?This condition is more likely to occur in people who: ?Have a weak body defense system (immune system). ?Have open wounds on the skin, such as cuts, burns, bites, and scrapes. Bacteria can enter the body through these open wounds. ?Are older than 55 years of age. ?Have diabetes. ?Have a type of long-lasting (chronic) liver disease (cirrhosis) or kidney disease. ?Are obese. ?Have a skin condition such as: ?Itchy rash (eczema). ?Slow movement of blood in the veins (venous stasis). ?Fluid buildup below the skin (edema). ?Have had radiation therapy. ?Use IV drugs. ?What are the signs or symptoms? ?Symptoms of this condition include: ?Redness, streaking, or spotting on the skin. ?Swollen area of the skin. ?Tenderness or pain  when an area of the skin is touched. ?Warm skin. ?A fever. ?Chills. ?Blisters. ?How is this diagnosed? ?This condition is diagnosed based on a medical history and physical exam. You may also have tests, including: ?Blood tests. ?Imaging tests. ?How is this treated? ?Treatment for this condition may include: ?Medicines, such as antibiotic medicines or medicines to treat allergies (antihistamines). ?Supportive care, such as rest and application of cold or warm cloths (compresses) to the skin. ?Hospital care, if the condition is severe. ?The infection usually starts to get better within 1-2 days of treatment. ?Follow these instructions at home: ? ?Medicines ?Take over-the-counter and prescription medicines only as told by your health care provider. ?If you were prescribed an antibiotic medicine, take it as told by your health care provider. Do not stop taking the antibiotic even if you start to feel better. ?General instructions ?Drink enough fluid to keep your urine pale yellow. ?Do not touch or rub the infected area. ?Raise (elevate) the infected area above the level of your heart while you are sitting or lying down. ?Apply warm or cold compresses to the affected area as told by your health care provider. ?Keep all follow-up visits as told by your health care provider. This is important. These visits let your health care provider make sure a more serious infection is not developing. ?Contact a health care provider if: ?You have a fever. ?Your symptoms do not begin to improve within 1-2 days of starting treatment. ?Your bone or joint underneath the infected area becomes painful after the skin has healed. ?Your infection returns in  the same area or another area. ?You notice a swollen bump in the infected area. ?You develop new symptoms. ?You have a general ill feeling (malaise) with muscle aches and pains. ?Get help right away if: ?Your symptoms get worse. ?You feel very sleepy. ?You develop vomiting or diarrhea that  persists. ?You notice red streaks coming from the infected area. ?Your red area gets larger or turns dark in color. ?These symptoms may represent a serious problem that is an emergency. Do not wait to see if the symptoms will go away. Get medical help right away. Call your local emergency services (911 in the [place] Do not drive yourself to the hospital. ?Summary ?Cellulitis is a skin infection. This condition occurs most often in the arms and lower legs. ?Treatment for this condition may include medicines, such as antibiotic medicines or antihistamines. ?Take over-the-counter and prescription medicines only as told by your health care provider. If you were prescribed an antibiotic medicine, do not stop taking the antibiotic even if you start to feel better. ?Contact a health care provider if your symptoms do not begin to improve within 1-2 days of starting treatment or your symptoms get worse. ?Keep all follow-up visits as told by your health care provider. This is important. These visits let your health care provider make sure that a more serious infection is not developing. ?This information is not intended to replace advice given to you by your health care provider. Make sure you discuss any questions you have with your health care provider. ?Document Revised: 04/11/2021 Document Reviewed: 04/11/2021 ?Elsevier Patient Education ? [place]. ? ?

## 2021-10-28 NOTE — Assessment & Plan Note (Addendum)
Nontoxic in appearance. Localized area with intense erythema and increased warmth after 2/2 shingrex vaccine. Discussed symptoms more likely injection site reaction. Advised cool ice packs, benadryl PO OTC. We both agreed possible concern for associated cellulitis and therefore we agreed to start empiric antibiotic, keflex. Advised probiotics. She is unable to take NSAIDs as she is on plavix.  She is in notable pain while in the office today.  Agreed to give her hydrocodone/acetaminophen 5-325 mg, total of 5 tablets, to use sparingly for pain control.  She will let me know how she is doing and certainly if symptoms do not rapidly resolve with above plan.  ?I looked up patient on [place] Controlled Substances Reporting System PMP AWARE and saw no activity that raised concern of inappropriate use.  ? ?

## 2021-10-29 ENCOUNTER — Telehealth (HOSPITAL_COMMUNITY): Payer: Self-pay

## 2021-10-29 ENCOUNTER — Other Ambulatory Visit (HOSPITAL_COMMUNITY): Payer: Self-pay | Admitting: Interventional Radiology

## 2021-10-29 DIAGNOSIS — I671 Cerebral aneurysm, nonruptured: Secondary | ICD-10-CM

## 2021-10-29 LAB — CYTOLOGY - PAP
Comment: NEGATIVE
Diagnosis: NEGATIVE
High risk HPV: NEGATIVE

## 2021-10-29 MED ORDER — HYDROCODONE-ACETAMINOPHEN 5-325 MG PO TABS: 1.0000 | ORAL_TABLET | Freq: Two times a day (BID) | ORAL | 0 refills | Status: DC | PRN

## 2021-10-29 NOTE — Telephone Encounter (Signed)
Pt called to find out when she would be scheduled. I informed her that I have to get anesthesia booked. I will call her back today with an available date and time. Pt agreed. AW ?

## 2021-10-29 NOTE — Telephone Encounter (Signed)
Spoke to patient, she stated that she did pick up the [place] and she was unable to pick up Hydrocodone wants to know if [facility] can be sent to [facility] at [place], [address]. [phone]. aRM PAIN IS SUBSIDING A LITTLE BUT STILL HURTING. ?

## 2021-10-29 NOTE — Telephone Encounter (Signed)
FYI [facility]clean ? ?[place] ?Call pt ?Get pharmacy info and add to her chart ?[facility] or [facility]? ? ?How is arm pain today? ? ?

## 2021-10-29 NOTE — Telephone Encounter (Signed)
Can you help?

## 2021-10-29 NOTE — Telephone Encounter (Signed)
[facility] ?[facility] ? ?

## 2021-10-29 NOTE — Telephone Encounter (Signed)
-----   Message from [place], [place] sent at 10/28/2021  4:38 PM EDT ----- ?Regarding: RE: ?Per Dr. Estanislado Pandy, ok to schedule with anesthesia.  Hopefully they will be able to use deep sedation vs. GA.  ? ?Thanks ? ? ?----- Message ----- ?From: Gildardo Pounds D ?Sent: 10/28/2021  12:34 PM EDT ?To: Docia Barrier, PA ?Subject: FW:                                           ? ?Sherlie Ban,  ? ?Will you please review this with Dev and let me know what his plans are?  ? ?Thanks,  ?Ash ?----- Message ----- ?From: Joanell Rising ?Sent: 10/25/2021   4:18 PM EDT ?To: Luanne Bras, MD, [place] Dess ?Subject: FW:                                           ? ?Please see below and let Caryl Pina know what you are willing to do for this patient's angiogram. ? ?Thanks ?Delsa Sale ?----- Message ----- ?From: Nelva Bush, MD ?Sent: 10/18/2021   3:51 PM EDT ?To: Lin Landsman, MD, Joanell Rising, # ?Subject: RE:                                           ? ?Good afternoon, ? ?Based on my experience with this patient during a prior heart catheterization, I would strongly encourage involvement of anesthesia to provide deep sedation or general anesthesia. It was almost impossible to complete a diagnostic catheterization with moderate sedation, as the patient would at times be nearly obtunded and then suddenly become extremely agitated on the table. I have told her that if she ever were to need another cardiac catheterization, it would need to be done with anesthesia for her safety. I hope this is helpful for your planning. ? ?Chris End ?----- Message ----- ?From: Joanell Rising ?Sent: 10/18/2021   1:22 PM EDT ?To: Nelva Bush, MD, Lin Landsman, MD, # ? ?Good day, ? ?Dr. Estanislado Pandy is trying to schedule a diagnostic cerebral angiogram with Ms. Zahler. We only use moderate sedation for this procedure. The patient states that the two of you doctors can tell us that she must be put under general  anesthesia for any procedure that would require just moderate sedation as she wakes up with only sedation. Dr. Estanislado Pandy would like to know if either of you have said this or feel that she would need this for her procedure. He does not want to use GA for this if it can be avoided.  ? ?Thanks ?[name] ? ? ? ? ?

## 2021-10-29 NOTE — Telephone Encounter (Signed)
I spoke with [facility] & confirmed that prescription for Norco was received. It was & ready to be picked up. I have called and patient aware that she can pick up.  ?

## 2021-11-06 NOTE — Progress Notes (Signed)
TWO VISITORS ARE ALLOWED TO COME WITH YOU AND STAY IN THE SURGICAL WAITING ROOM ONLY DURING PRE OP AND PROCEDURE DAY OF SURGERY.  ? ?PCP - Dr Olivia Mackie McLean-Scocuzza ?Cardiologist - Dr Harrell Gave End ?Oncology - Dr Randa Evens ?Pulmonology - Dr Vernard Gambles ? ?Chest x-ray - 04/22/21 (1V) ?EKG - 04/22/21 ?Stress Test - 03/01/19 ?ECHO - 03/16/19 ?Cardiac Cath - 09/11/20 ? ?ICD Pacemaker/Loop - n/a ? ?Sleep Study -  Yes ?CPAP - Does not use CPAP ? ?Diabetes Type 2, No meds, diet controlled. Does not check blood sugar. ? ?Aspirin & Plavix Instructions: Follow your surgeon's instructions on when to stop aspirin prior to surgery,  If no instructions were given by your surgeon then you will need to call the office for those instructions.  Patient agreed to call MD for ASA and Plavix instructions for clarification. ? ?Anesthesia review: Yes ? ?STOP now taking any Aspirin (unless otherwise instructed by your surgeon), Aleve, Naproxen, Ibuprofen, Motrin, Advil, Goody's, BC's, all herbal medications, fish oil, and all vitamins.  ? ?Coronavirus Screening ?Do you have any of the following symptoms:  ?Cough yes/no: No ?Fever (>100.90F)  yes/no: No ?Runny nose yes/no: No ?Sore throat yes/no: No ?Difficulty breathing/shortness of breath  Yes ? ?Have you traveled in the last 14 days and where? yes/no: No ? ?Patient verbalized understanding of instructions that were given via phone. ?

## 2021-11-07 ENCOUNTER — Other Ambulatory Visit: Payer: Self-pay | Admitting: Student

## 2021-11-07 ENCOUNTER — Encounter (HOSPITAL_COMMUNITY): Payer: Self-pay | Admitting: Interventional Radiology

## 2021-11-07 ENCOUNTER — Other Ambulatory Visit: Payer: Self-pay | Admitting: Internal Medicine

## 2021-11-07 NOTE — Anesthesia Preprocedure Evaluation (Addendum)
Anesthesia Evaluation  ?Patient identified by MRN, date of birth, ID band ?Patient awake ? ? ? ?Reviewed: ?Allergy & Precautions, NPO status , Patient's Chart, lab work & pertinent test results ? ?Airway ?Mallampati: II ? ?TM Distance: >3 FB ?Neck ROM: Full ? ? ? Dental ? ?(+) Poor Dentition, Partial Lower, Missing ?  ?Pulmonary ?asthma , sleep apnea , COPD, former smoker,  ?  ?Pulmonary exam normal ?breath sounds clear to auscultation ? ? ? ? ? ? Cardiovascular ?Exercise Tolerance: Good ?hypertension, + angina + CAD and + Peripheral Vascular Disease  ?Normal cardiovascular exam ?Rhythm:Regular Rate:Normal ? ? ?  ?Neuro/Psych ? Headaches, PSYCHIATRIC DISORDERS Anxiety Depression Bipolar Disorder   ? GI/Hepatic ?GERD  ,(+)  ?  ? substance abuse ? cocaine use and marijuana use,   ?Endo/Other  ?diabetes ? Renal/GU ?  ? ?  ?Musculoskeletal ? ? Abdominal ?  ?Peds ? Hematology ? ?(+) Refused blood products: chronic pain.,   ?Anesthesia Other Findings ? ? Reproductive/Obstetrics ? ?  ? ? ? ? ? ? ? ? ? ? ? ? ? ?  ?  ? ? ? ? ? ? ?Anesthesia Physical ?Anesthesia Plan ? ?ASA: 4 ? ?Anesthesia Plan:   ? ?Post-op Pain Management:   ? ?Induction: Intravenous ? ?PONV Risk Score and Plan: 2 and Treatment may vary due to age or medical condition, Ondansetron, Midazolam and Dexamethasone ? ?Airway Management Planned:  ? ?Additional Equipment: Arterial line ? ?Intra-op Plan:  ? ?Post-operative Plan: Extubation in OR ? ?Informed Consent: I have reviewed the patients History and Physical, chart, labs and discussed the procedure including the risks, benefits and alternatives for the proposed anesthesia with the patient or authorized representative who has indicated his/her understanding and acceptance.  ? ? ? ?Dental advisory given ? ?Plan Discussed with: CRNA, Anesthesiologist and Surgeon ? ?Anesthesia Plan Comments: (Case posted as MAC. Will discuss with surgeon.  Norton Blizzard, MD  ? ? ?PAT note by Karoline Caldwell, PA-C: ? ?Follows cardiology for history of CAD s/p PCI to left circumflex with chronic stable angina, HTN, HLD, OSA (not using CPAP).  Last seen by Dr. Saunders Revel 08/21/2021.  Noted that her chest pain symptoms had improved with escalation of isosorbide mononitrate 90 mg daily.  ABIs were ordered due to patient report of leg cramping (test done 09/26/2021 showed evidence of some narrowing/blockage in small arteries in her calf/feet, however large arteries had no significant disease).  She was recommended follow-up in 6 months. ? ?Former smoker.  30-pack-year history.  Quit 12/31/2020.  She has COPD and is maintained on Breztri. ? ?Follows with hematology for history of waxing and waning leukocytosis and thrombocytosis.  Last seen 08/27/2021 WBC was normal and platelets mildly elevated at 475.  Myeloproliferative work-up in the past has been otherwise unremarkable.  Recommended yearly follow-up. ? ?Patient will need day of surgery labs and evaluation. ? ?EKG 04/30/21: NSR. Rate 71. Nonspecific t wave abnormality.  ? ?Cath 09/11/2020: ?Conclusions: ?1. Moderately severe multivessel coronary artery disease including mild plaquing of the proximal and distal LAD, 40% in-stent restenosis of ostial LCx, 30% distal LCx lesion, and focal 60% mid RCA stenosis. ?2. Hyperdynamic left ventricular contraction with moderately elevated filling pressure. ?3. Significant difficulty attaining adequate sedation during procedure. ?The patient would fluctuate between marked agitation and sleep every few minutes after having received 3 mg of midazolam and 75 mcg of fentanyl. ?? ?Recommendations: ?1. Continue current antianginal therapy with escalation as tolerated if recurrent angina occurs. ?2. Add  furosemide 20 mg daily for gentle diuresis in the setting of elevated LVEDP consistent with diastolic dysfunction. ?This may be contributing to the patient's symptoms. ?3. If the patient has refractory angina, functional study to assess hemodynamic  significance of LCx/RCA disease is recommended. ?If catheterization/PCI is needed in the future, involvement of anesthesia will need to be considered. ? ?TTE 03/16/2019: ??1. The left ventricle has normal systolic function with an ejection  ?fraction of 60-65%. The cavity size was normal. Left ventricular diastolic  ?Doppler parameters are consistent with impaired relaxation.  ??2. The right ventricle has normal systolic function. The cavity was  ?normal. There is no increase in right ventricular wall thickness.Unable to  ?estimate RVSP.  ? ?)  ? ? ? ? ? ?Anesthesia Quick Evaluation ? ?

## 2021-11-07 NOTE — Progress Notes (Signed)
Anesthesia Chart Review: ?Same day workup ? ?Follows cardiology for history of CAD s/p PCI to left circumflex with chronic stable angina, HTN, HLD, OSA (not using CPAP).  Last seen by Dr. Saunders Revel 08/21/2021.  Noted that her chest pain symptoms had improved with escalation of isosorbide mononitrate 90 mg daily.  ABIs were ordered due to patient report of leg cramping (test done 09/26/2021 showed evidence of some narrowing/blockage in small arteries in her calf/feet, however large arteries had no significant disease).  She was recommended follow-up in 6 months. ? ?Former smoker.  30-pack-year history.  Quit 12/31/2020.  She has COPD and is maintained on Breztri. ? ?Follows with hematology for history of waxing and waning leukocytosis and thrombocytosis.  Last seen 08/27/2021 WBC was normal and platelets mildly elevated at 475.  Myeloproliferative work-up in the past has been otherwise unremarkable.  Recommended yearly follow-up. ? ?Patient will need day of surgery labs and evaluation. ? ?EKG 04/30/21: NSR. Rate 71. Nonspecific t wave abnormality.  ? ?Cath 09/11/2020: ?Conclusions: ?Moderately severe multivessel coronary artery disease including mild plaquing of the proximal and distal LAD, 40% in-stent restenosis of ostial LCx, 30% distal LCx lesion, and focal 60% mid RCA stenosis. ?Hyperdynamic left ventricular contraction with moderately elevated filling pressure. ?Significant difficulty attaining adequate sedation during procedure.  The patient would fluctuate between marked agitation and sleep every few minutes after having received 3 mg of midazolam and 75 mcg of fentanyl. ?  ?Recommendations: ?Continue current antianginal therapy with escalation as tolerated if recurrent angina occurs. ?Add furosemide 20 mg daily for gentle diuresis in the setting of elevated LVEDP consistent with diastolic dysfunction.  This may be contributing to the patient's symptoms. ?If the patient has refractory angina, functional study to assess  hemodynamic significance of LCx/RCA disease is recommended.  If catheterization/PCI is needed in the future, involvement of anesthesia will need to be considered. ? ?TTE 03/16/2019: ? 1. The left ventricle has normal systolic function with an ejection  ?fraction of 60-65%. The cavity size was normal. Left ventricular diastolic  ?Doppler parameters are consistent with impaired relaxation.  ? 2. The right ventricle has normal systolic function. The cavity was  ?normal. There is no increase in right ventricular wall thickness.Unable to  ?estimate RVSP.  ? ? ? ? ?Karoline Caldwell, PA-C ?[facility] Short Stay Center/Anesthesiology ?Phone [phone] ?11/07/2021 12:38 PM ? ?

## 2021-11-08 ENCOUNTER — Encounter (HOSPITAL_COMMUNITY)
Admission: RE | Disposition: A | Discharge status: Home / Self Care | Payer: Self-pay | Source: Home / Self Care | Attending: Interventional Radiology

## 2021-11-08 ENCOUNTER — Encounter (HOSPITAL_COMMUNITY): Payer: Self-pay | Admitting: Interventional Radiology

## 2021-11-08 ENCOUNTER — Ambulatory Visit (HOSPITAL_BASED_OUTPATIENT_CLINIC_OR_DEPARTMENT_OTHER): Payer: Medicare Other | Admitting: Physician Assistant

## 2021-11-08 ENCOUNTER — Ambulatory Visit (HOSPITAL_COMMUNITY): Payer: Medicare Other | Admitting: Physician Assistant

## 2021-11-08 ENCOUNTER — Ambulatory Visit (HOSPITAL_COMMUNITY)
Admission: RE | Admit: 2021-11-08 | Discharge: 2021-11-08 | Disposition: A | Discharge status: Home / Self Care | Payer: Medicare Other | Attending: Interventional Radiology | Admitting: Interventional Radiology

## 2021-11-08 ENCOUNTER — Ambulatory Visit (HOSPITAL_COMMUNITY)
Admission: RE | Admit: 2021-11-08 | Discharge: 2021-11-08 | Disposition: A | Discharge status: Home / Self Care | Payer: Medicare Other | Source: Ambulatory Visit | Attending: Interventional Radiology | Admitting: Interventional Radiology

## 2021-11-08 ENCOUNTER — Other Ambulatory Visit: Payer: Self-pay

## 2021-11-08 DIAGNOSIS — Z7982 Long term (current) use of aspirin: Secondary | ICD-10-CM | POA: Insufficient documentation

## 2021-11-08 DIAGNOSIS — I2510 Atherosclerotic heart disease of native coronary artery without angina pectoris: Secondary | ICD-10-CM | POA: Insufficient documentation

## 2021-11-08 DIAGNOSIS — K219 Gastro-esophageal reflux disease without esophagitis: Secondary | ICD-10-CM | POA: Insufficient documentation

## 2021-11-08 DIAGNOSIS — F17210 Nicotine dependence, cigarettes, uncomplicated: Secondary | ICD-10-CM | POA: Diagnosis not present

## 2021-11-08 DIAGNOSIS — E785 Hyperlipidemia, unspecified: Secondary | ICD-10-CM | POA: Insufficient documentation

## 2021-11-08 DIAGNOSIS — J449 Chronic obstructive pulmonary disease, unspecified: Secondary | ICD-10-CM | POA: Diagnosis not present

## 2021-11-08 DIAGNOSIS — Z87891 Personal history of nicotine dependence: Secondary | ICD-10-CM | POA: Insufficient documentation

## 2021-11-08 DIAGNOSIS — Z7902 Long term (current) use of antithrombotics/antiplatelets: Secondary | ICD-10-CM | POA: Insufficient documentation

## 2021-11-08 DIAGNOSIS — I671 Cerebral aneurysm, nonruptured: Secondary | ICD-10-CM | POA: Diagnosis not present

## 2021-11-08 DIAGNOSIS — I25119 Atherosclerotic heart disease of native coronary artery with unspecified angina pectoris: Secondary | ICD-10-CM | POA: Diagnosis not present

## 2021-11-08 DIAGNOSIS — E1151 Type 2 diabetes mellitus with diabetic peripheral angiopathy without gangrene: Secondary | ICD-10-CM | POA: Insufficient documentation

## 2021-11-08 DIAGNOSIS — I10 Essential (primary) hypertension: Secondary | ICD-10-CM | POA: Insufficient documentation

## 2021-11-08 DIAGNOSIS — F319 Bipolar disorder, unspecified: Secondary | ICD-10-CM | POA: Insufficient documentation

## 2021-11-08 DIAGNOSIS — I6503 Occlusion and stenosis of bilateral vertebral arteries: Secondary | ICD-10-CM | POA: Diagnosis not present

## 2021-11-08 DIAGNOSIS — F418 Other specified anxiety disorders: Secondary | ICD-10-CM | POA: Diagnosis not present

## 2021-11-08 HISTORY — PX: IR ANGIO INTRA EXTRACRAN SEL COM CAROTID INNOMINATE BILAT MOD SED: IMG5360

## 2021-11-08 HISTORY — PX: IR US GUIDE VASC ACCESS RIGHT: IMG2390

## 2021-11-08 HISTORY — PX: IR 3D INDEPENDENT WKST: IMG2385

## 2021-11-08 HISTORY — PX: IR ANGIO VERTEBRAL SEL VERTEBRAL BILAT MOD SED: IMG5369

## 2021-11-08 HISTORY — PX: RADIOLOGY WITH ANESTHESIA: SHX6223

## 2021-11-08 LAB — CBC
HCT: 38.6 % (ref 36.0–46.0)
Hemoglobin: 12.6 g/dL (ref 12.0–15.0)
MCH: 29.1 pg (ref 26.0–34.0)
MCHC: 32.6 g/dL (ref 30.0–36.0)
MCV: 89.1 fL (ref 80.0–100.0)
Platelets: 383 10*3/uL (ref 150–400)
RBC: 4.33 MIL/uL (ref 3.87–5.11)
RDW: 13.7 % (ref 11.5–15.5)
WBC: 9.4 10*3/uL (ref 4.0–10.5)
nRBC: 0 % (ref 0.0–0.2)

## 2021-11-08 LAB — PROTIME-INR
INR: 1 (ref 0.8–1.2)
Prothrombin Time: 12.8 seconds (ref 11.4–15.2)

## 2021-11-08 LAB — BASIC METABOLIC PANEL
Anion gap: 7 (ref 5–15)
BUN: 20 mg/dL (ref 6–20)
CO2: 23 mmol/L (ref 22–32)
Calcium: 8.5 mg/dL — ABNORMAL LOW (ref 8.9–10.3)
Chloride: 105 mmol/L (ref 98–111)
Creatinine, Ser: 0.84 mg/dL (ref 0.44–1.00)
GFR, Estimated: >60 mL/min (ref >60)
Glucose, Bld: 112 mg/dL — ABNORMAL HIGH (ref 70–99)
Potassium: 3.6 mmol/L (ref 3.5–5.1)
Sodium: 135 mmol/L (ref 135–145)

## 2021-11-08 LAB — GLUCOSE, CAPILLARY
Glucose-Capillary: 124 mg/dL — ABNORMAL HIGH (ref 70–99)
Glucose-Capillary: 126 mg/dL — ABNORMAL HIGH (ref 70–99)
Glucose-Capillary: 145 mg/dL — ABNORMAL HIGH (ref 70–99)

## 2021-11-08 SURGERY — IR WITH ANESTHESIA
Anesthesia: Monitor Anesthesia Care

## 2021-11-08 MED ORDER — FENTANYL CITRATE (PF) 100 MCG/2ML IJ SOLN
INTRAMUSCULAR | Status: DC | PRN
  Administered 2021-11-08 (×4): 25 ug via INTRAVENOUS

## 2021-11-08 MED ORDER — ONDANSETRON HCL 4 MG/2ML IJ SOLN
INTRAMUSCULAR | Status: DC | PRN
  Administered 2021-11-08: 4 mg via INTRAVENOUS

## 2021-11-08 MED ORDER — IOHEXOL 300 MG/ML  SOLN
100.0000 mL | Freq: Once | INTRAMUSCULAR | Status: AC | PRN
  Administered 2021-11-08: 10 mL via INTRA_ARTERIAL

## 2021-11-08 MED ORDER — FENTANYL CITRATE (PF) 100 MCG/2ML IJ SOLN
INTRAMUSCULAR | Status: AC
  Filled 2021-11-08: qty 2

## 2021-11-08 MED ORDER — OXYCODONE HCL 5 MG/5ML PO SOLN: 5.0000 mg | Freq: Once | ORAL | Status: AC | PRN

## 2021-11-08 MED ORDER — PHENYLEPHRINE HCL-NACL 20-0.9 MG/250ML-% IV SOLN
INTRAVENOUS | Status: DC | PRN
  Administered 2021-11-08: 20 ug/min via INTRAVENOUS

## 2021-11-08 MED ORDER — PROPOFOL 500 MG/50ML IV EMUL
INTRAVENOUS | Status: DC | PRN
  Administered 2021-11-08: 20 ug/kg/min via INTRAVENOUS

## 2021-11-08 MED ORDER — FENTANYL CITRATE (PF) 100 MCG/2ML IJ SOLN: 25.0000 ug | INTRAMUSCULAR | Status: DC | PRN

## 2021-11-08 MED ORDER — ALBUTEROL SULFATE HFA 108 (90 BASE) MCG/ACT IN AERS
INHALATION_SPRAY | RESPIRATORY_TRACT | Status: DC | PRN
  Administered 2021-11-08: 4 via RESPIRATORY_TRACT

## 2021-11-08 MED ORDER — PROPOFOL 10 MG/ML IV BOLUS
INTRAVENOUS | Status: DC | PRN
  Administered 2021-11-08: 50 mg via INTRAVENOUS
  Administered 2021-11-08: 150 mg via INTRAVENOUS

## 2021-11-08 MED ORDER — SUGAMMADEX SODIUM 200 MG/2ML IV SOLN
INTRAVENOUS | Status: DC | PRN
  Administered 2021-11-08: 200 mg via INTRAVENOUS

## 2021-11-08 MED ORDER — OXYCODONE HCL 5 MG PO TABS
5.0000 mg | ORAL_TABLET | Freq: Once | ORAL | Status: AC | PRN
  Administered 2021-11-08: 5 mg via ORAL

## 2021-11-08 MED ORDER — SODIUM CHLORIDE 0.9 % IV SOLN: INTRAVENOUS | Status: DC

## 2021-11-08 MED ORDER — ACETAMINOPHEN 500 MG PO TABS
1000.0000 mg | ORAL_TABLET | Freq: Once | ORAL | Status: AC
  Administered 2021-11-08: 1000 mg via ORAL
  Filled 2021-11-08: qty 2

## 2021-11-08 MED ORDER — VERAPAMIL HCL 2.5 MG/ML IV SOLN: INTRA_ARTERIAL | Status: AC | PRN

## 2021-11-08 MED ORDER — LIDOCAINE HCL 1 % IJ SOLN
INTRAMUSCULAR | Status: AC
  Filled 2021-11-08: qty 20

## 2021-11-08 MED ORDER — MIDAZOLAM HCL 2 MG/2ML IJ SOLN
INTRAMUSCULAR | Status: DC | PRN
  Administered 2021-11-08 (×2): 1 mg via INTRAVENOUS

## 2021-11-08 MED ORDER — NITROGLYCERIN 1 MG/10 ML FOR IR/CATH LAB
INTRA_ARTERIAL | Status: AC | PRN
  Administered 2021-11-08 (×2): 200 ug via INTRA_ARTERIAL

## 2021-11-08 MED ORDER — MIDAZOLAM HCL 2 MG/2ML IJ SOLN
INTRAMUSCULAR | Status: AC
  Filled 2021-11-08: qty 2

## 2021-11-08 MED ORDER — CHLORHEXIDINE GLUCONATE 0.12 % MT SOLN
OROMUCOSAL | Status: AC
  Administered 2021-11-08: 15 mL
  Filled 2021-11-08: qty 15

## 2021-11-08 MED ORDER — HYDRALAZINE HCL 20 MG/ML IJ SOLN
INTRAMUSCULAR | Status: AC
  Filled 2021-11-08: qty 1

## 2021-11-08 MED ORDER — ROCURONIUM BROMIDE 10 MG/ML (PF) SYRINGE
PREFILLED_SYRINGE | INTRAVENOUS | Status: DC | PRN
  Administered 2021-11-08: 60 mg via INTRAVENOUS

## 2021-11-08 MED ORDER — SUCCINYLCHOLINE CHLORIDE 200 MG/10ML IV SOSY
PREFILLED_SYRINGE | INTRAVENOUS | Status: DC | PRN
  Administered 2021-11-08: 160 mg via INTRAVENOUS

## 2021-11-08 MED ORDER — INSULIN ASPART 100 UNIT/ML IJ SOLN: 0.0000 [IU] | INTRAMUSCULAR | Status: DC | PRN

## 2021-11-08 MED ORDER — HYDRALAZINE HCL 20 MG/ML IJ SOLN
10.0000 mg | Freq: Once | INTRAMUSCULAR | Status: AC
  Administered 2021-11-08: 10 mg via INTRAVENOUS

## 2021-11-08 MED ORDER — OXYCODONE HCL 5 MG PO TABS
ORAL_TABLET | ORAL | Status: AC
  Filled 2021-11-08: qty 1

## 2021-11-08 MED ORDER — ESMOLOL HCL 100 MG/10ML IV SOLN
INTRAVENOUS | Status: DC | PRN
  Administered 2021-11-08: 40 mg via INTRAVENOUS

## 2021-11-08 MED ORDER — IOHEXOL 300 MG/ML  SOLN
100.0000 mL | Freq: Once | INTRAMUSCULAR | Status: AC | PRN
  Administered 2021-11-08: 21 mL via INTRA_ARTERIAL

## 2021-11-08 MED ORDER — DEXMEDETOMIDINE (PRECEDEX) IN NS 20 MCG/5ML (4 MCG/ML) IV SYRINGE
PREFILLED_SYRINGE | INTRAVENOUS | Status: DC | PRN
  Administered 2021-11-08: 4 ug via INTRAVENOUS
  Administered 2021-11-08 (×2): 8 ug via INTRAVENOUS
  Administered 2021-11-08: 4 ug via INTRAVENOUS
  Administered 2021-11-08: 8 ug via INTRAVENOUS

## 2021-11-08 MED ORDER — DEXAMETHASONE SODIUM PHOSPHATE 10 MG/ML IJ SOLN
INTRAMUSCULAR | Status: DC | PRN
  Administered 2021-11-08: 5 mg via INTRAVENOUS

## 2021-11-08 MED ORDER — VERAPAMIL HCL 2.5 MG/ML IV SOLN
INTRAVENOUS | Status: AC
  Filled 2021-11-08: qty 2

## 2021-11-08 MED ORDER — PHENYLEPHRINE 80 MCG/ML (10ML) SYRINGE FOR IV PUSH (FOR BLOOD PRESSURE SUPPORT)
PREFILLED_SYRINGE | INTRAVENOUS | Status: DC | PRN
  Administered 2021-11-08: 80 ug via INTRAVENOUS

## 2021-11-08 MED ORDER — IOHEXOL 300 MG/ML  SOLN
100.0000 mL | Freq: Once | INTRAMUSCULAR | Status: AC | PRN
  Administered 2021-11-08: 70 mL via INTRA_ARTERIAL

## 2021-11-08 MED ORDER — AMISULPRIDE (ANTIEMETIC) 5 MG/2ML IV SOLN: 10.0000 mg | Freq: Once | INTRAVENOUS | Status: DC | PRN

## 2021-11-08 MED ORDER — HEPARIN SODIUM (PORCINE) 1000 UNIT/ML IJ SOLN
INTRAMUSCULAR | Status: AC
  Filled 2021-11-08: qty 10

## 2021-11-08 MED ORDER — NITROGLYCERIN 1 MG/10 ML FOR IR/CATH LAB
INTRA_ARTERIAL | Status: AC
  Filled 2021-11-08: qty 10

## 2021-11-08 MED ORDER — ONDANSETRON HCL 4 MG/2ML IJ SOLN: 4.0000 mg | Freq: Once | INTRAMUSCULAR | Status: DC | PRN

## 2021-11-08 MED ORDER — CLEVIDIPINE BUTYRATE 0.5 MG/ML IV EMUL
INTRAVENOUS | Status: AC
  Filled 2021-11-08: qty 50

## 2021-11-08 NOTE — Transfer of Care (Signed)
Immediate Anesthesia Transfer of Care Note ? ?Patient: Shawna Anderson ? ?Procedure(s) Performed: ANGIOGRAM ? ?Patient Location: PACU ? ?Anesthesia Type:General ? ?Level of Consciousness: awake, alert  and oriented ? ?Airway & Oxygen Therapy: Patient Spontanous Breathing and Patient connected to nasal cannula oxygen ? ?Post-op Assessment: Report given to RN and Post -op Vital signs reviewed and stable ? ?Post vital signs: Reviewed and stable ? ?Last Vitals:  ?Vitals Value Taken Time  ?BP 160/79 11/08/21 1131  ?Temp    ?Pulse 61 11/08/21 1141  ?Resp 13 11/08/21 1141  ?SpO2 93 % 11/08/21 1141  ?Vitals shown include unvalidated device data. ? ?Last Pain:  ?Vitals:  ? 11/08/21 0655  ?TempSrc:   ?PainSc: 8   ?   ? ?Patients Stated Pain Goal: 2 (11/08/21 1117) ? ?Complications: No notable events documented. ?

## 2021-11-08 NOTE — Anesthesia Procedure Notes (Signed)
Procedure Name: Intubation ?Date/Time: 11/08/2021 9:57 AM ?Performed by: Imagene Riches, CRNA ?Pre-anesthesia Checklist: Patient identified, Emergency Drugs available, Suction available and Patient being monitored ?Patient Re-evaluated:Patient Re-evaluated prior to induction ?Oxygen Delivery Method: Circle System Utilized ?Preoxygenation: Pre-oxygenation with 100% oxygen ?Induction Type: IV induction ?Ventilation: Oral airway inserted - appropriate to patient size, Mask ventilation with difficulty and Two handed mask ventilation required ?Laryngoscope Size: Glidescope and 3 ?Grade View: Grade I ?Tube type: Oral ?Tube size: 7.0 mm ?Number of attempts: 1 ?Airway Equipment and Method: Stylet and Oral airway ?Placement Confirmation: ETT inserted through vocal cords under direct vision, positive ETCO2 and breath sounds checked- equal and bilateral ?Secured at: 22 cm ?Tube secured with: Tape ?Dental Injury: Teeth and Oropharynx as per pre-operative assessment  ? ? ? ? ?

## 2021-11-08 NOTE — Procedures (Signed)
INR. ?Four-vessel cerebral arteriogram. ?Right radial approach. ?Findings. ?1.Approximately 4.5 mm x 3 mm right P-comm artery aneurysm. ?2.  Approximately 2.5 x 2.5 mm wide necked lobulated right MCA trifurcation aneurysm. ?3.  Severe bilateral vertebral artery origin stenosis. ? ?General anesthesia reversed..  Patient extubated. ?Pupils are 2 to 3 mm equal sluggish..  No facial asymmetry.  Moves all fours equally. ?Denies nausea vomiting or headache.  Distal right radial pulse present. ? ?Arlean Hopping MD ?

## 2021-11-08 NOTE — Sedation Documentation (Signed)
Pt able to void in bed pan

## 2021-11-08 NOTE — H&P (Signed)
? ?Chief Complaint: ?Aneurysms X 2 involving the right anterior circulation. Request is for cerebral angiogram with general anesthesia ? ?Supervising Physician: Luanne Bras ? ?Patient Status: [facility] - Out-pt ? ?History of Present Illness: ?Shawna Anderson is a 56 y.o. female outpatient. Smoker. History of  CAD s/pci LCX, HTN, HLD, GERD, substance abuse. Found to have 2 newly discovered aneurysms involving the right anterior circulation while being worked up for new onset of headaches X 2 months. Per MRI from 2.16.23 One in the right PCOM region and one in the right middle cerebral artery bifurcation region. The PCOM artery aneurysm measures approximately 4.25 x 3 mm, and right MCA trifurcation aneurysm measures approximately 3.1 mm x 3 mm. Patient was  seen in [facility] clinic with Dr. Estanislado Pandy.  On 4.5.23. Patient presented for diagnostic cerebral angiogram. Procedure to be performed under general anesthesia per patient and cardiology request. ? ?Patient alert, sitting up in bed. Endorses bilateral foot pain due to planta fasciitis. Denies any fevers, headache, chest pain, SOB, cough, abdominal pain, nausea, vomiting or bleeding. Return precautions and treatment recommendations and follow-up discussed with the patient  who is agreeable with the plan. ? ? ? ?Past Medical History:  ?Diagnosis Date  ? Anxiety   ? Asthma   ? Bipolar 1 disorder ([place])   ? Bipolar disorder ([place])   ? CAD (coronary artery disease)   ? s/p stent BMS OM Cx  ? Cervical herniated disc 04/12/2016  ? COPD (chronic obstructive pulmonary disease) ([place])   ? Depression   ? Diabetes mellitus with gastroparesis ([place])   ? Diabetes mellitus without complication ([place])   ? no meds, diet controlled  ? Diverticulitis   ? GERD (gastroesophageal reflux disease)   ? Glaucoma   ? History of blood transfusion   ? Hyperlipidemia   ? Hypertension   ? OSA (obstructive sleep apnea)   ? not using cpap   ? Plantar fasciitis   ? b/l feet s/p steroid shots w/o help  and left surgery w/o help   ? UTI (urinary tract infection)   ? ? ?Past Surgical History:  ?Procedure Laterality Date  ? ABDOMINAL HYSTERECTOMY    ? ABDOMINAL SURGERY  1995  ? Bowel resection.  ? CARDIAC CATHETERIZATION N/A 04/14/2016  ? Procedure: Left Heart Cath and Coronary Angiography;  Surgeon: Burnell Blanks, MD;  Location: [facility];  Service: Cardiovascular;  Laterality: N/A;  ? CARDIAC SURGERY    ? COLONOSCOPY WITH PROPOFOL N/A 07/11/2019  ? Procedure: COLONOSCOPY WITH PROPOFOL;  Surgeon: Jonathon Bellows, MD;  Location: [facility] ENDOSCOPY;  Service: Gastroenterology;  Laterality: N/A;  ? COLONOSCOPY WITH PROPOFOL N/A 07/29/2019  ? Procedure: COLONOSCOPY WITH PROPOFOL;  Surgeon: Jonathon Bellows, MD;  Location: [facility] ENDOSCOPY;  Service: Gastroenterology;  Laterality: N/A;  ? CORONARY ANGIOPLASTY WITH STENT PLACEMENT  2010  ? Drug eluting stent  ? ESOPHAGOGASTRODUODENOSCOPY (EGD) WITH PROPOFOL N/A 07/11/2019  ? Procedure: ESOPHAGOGASTRODUODENOSCOPY (EGD) WITH PROPOFOL;  Surgeon: Jonathon Bellows, MD;  Location: [facility] ENDOSCOPY;  Service: Gastroenterology;  Laterality: N/A;  ? IR RADIOLOGIST EVAL & MGMT  10/16/2021  ? LEFT HEART CATH AND CORONARY ANGIOGRAPHY Left 09/11/2020  ? Procedure: LEFT HEART CATH AND CORONARY ANGIOGRAPHY;  Surgeon: Nelva Bush, MD;  Location: [facility];  Service: Cardiovascular;  Laterality: Left;  ? OVARIAN CYST REMOVAL    ? ? ?Allergies: ?Ativan [lorazepam], Augmentin [amoxicillin-pot clavulanate], Lactose intolerance (gi), Latex, Tape, Xifaxan [rifaximin], and Drixoral allergy sinus [dexbromphen-pse-apap er] ? ?Medications: ?Prior to Admission  medications   ?Medication Sig Start Date End Date Taking? Authorizing Provider  ?albuterol (PROAIR HFA) 108 (90 Base) MCG/ACT inhaler Inhale 1-2 puffs into the lungs every 4 (four) hours as needed for wheezing or shortness of breath. 01/28/21  Yes McLean-Scocuzza, Nino Glow, MD  ?amLODipine (NORVASC) 10 MG tablet TAKE 1 TABLET BY MOUTH AT   BEDTIME 04/19/21  Yes McLean-Scocuzza, Nino Glow, MD  ?aspirin 81 MG chewable tablet Chew 81 mg by mouth daily.   Yes [provider]  ?atorvastatin (LIPITOR) 80 MG tablet TAKE 1 TABLET BY MOUTH  DAILY AT 6 PM. 05/15/21  Yes McLean-Scocuzza, Nino Glow, MD  ?Budeson-Glycopyrrol-Formoterol (BREZTRI AEROSPHERE) 160-9-4.8 MCG/ACT AERO Inhale 2 puffs into the lungs in the morning and at bedtime. Rinse 06/13/21  Yes Tyler Pita, MD  ?cephALEXin (KEFLEX) 500 MG capsule Take 1 capsule (500 mg total) by mouth every 6 (six) hours. 10/28/21  Yes Burnard Hawthorne, FNP  ?clopidogrel (PLAVIX) 75 MG tablet TAKE 1 TABLET BY MOUTH  DAILY 05/01/21  Yes Crecencio Mc, MD  ?dicyclomine (BENTYL) 20 MG tablet Take 1 tablet (20 mg total) by mouth 4 (four) times daily -  before meals and at bedtime. 09/12/21  Yes McLean-Scocuzza, Nino Glow, MD  ?divalproex (DEPAKOTE) 500 MG DR tablet TAKE 1 TABLET BY MOUTH  TWICE DAILY 08/12/21  Yes McLean-Scocuzza, Nino Glow, MD  ?ezetimibe (ZETIA) 10 MG tablet TAKE 1 TABLET BY MOUTH  DAILY WITH LIPITOR 80 MG AT NIGHT 10/16/21  Yes McLean-Scocuzza, Nino Glow, MD  ?isosorbide mononitrate (IMDUR) 30 MG 24 hr tablet Take 3 tablets (90 mg) by mouth once daily 04/30/21  Yes Furth, Cadence H, PA-C  ?methocarbamol (ROBAXIN) 500 MG tablet Take 1 tablet (500 mg total) by mouth 2 (two) times daily as needed for muscle spasms. 02/07/21  Yes McLean-Scocuzza, Nino Glow, MD  ?metoCLOPramide (REGLAN) 5 MG tablet TAKE 1 TABLET BY MOUTH 4  TIMES DAILY BEFORE MEALS  AND AT BEDTIME 06/10/21  Yes Vanga, Tally Due, MD  ?mometasone (ELOCON) 0.1 % cream Apply 1 application topically as directed. Qhs 5 days a week Monday through Friday 06/20/21  Yes Ralene Bathe, MD  ?nitroGLYCERIN (NITROSTAT) 0.4 MG SL tablet DISSOLVE 1 TABLET UNDER  TONGUE EVERY 5 MINUTES AS  NEEDED FOR CHEST PAIN MAX  OF 3 TABLETS IN 15 MINUTES  . CALL 911 AFTER THIRD DOSE 10/16/21  Yes Loel Dubonnet, NP  ?olmesartan-hydrochlorothiazide (BENICAR HCT)  20-12.5 MG tablet Take 1 tablet by mouth daily. In am if BP >130/>80 take 2 pills to equal 40-25 mg qd 10/01/21  Yes McLean-Scocuzza, Nino Glow, MD  ?ondansetron (ZOFRAN-ODT) 4 MG disintegrating tablet DISSOLVE 1 TABLET ON THE  TONGUE EVERY 8 HOURS AS  NEEDED FOR NAUEA OR  VOMITING 06/10/21  Yes McLean-Scocuzza, Nino Glow, MD  ?pantoprazole (PROTONIX) 40 MG tablet TAKE 1 TABLET BY MOUTH  TWICE DAILY BEFORE MEALS 06/10/21  Yes Jonathon Bellows, MD  ?promethazine (PHENERGAN) 25 MG tablet Take 1 tablet (25 mg total) by mouth 2 (two) times daily as needed for nausea or vomiting. 06/12/21  Yes McLean-Scocuzza, Nino Glow, MD  ?REPATHA SURECLICK 456 MG/ML SOAJ INJECT '140MG'$  SUBCUTANEOUSLY  EVERY 2 WEEKS 10/14/21  Yes End, Harrell Gave, MD  ?topiramate (TOPAMAX) 25 MG tablet Take 1 tablet (25 mg total) by mouth at bedtime. 08/28/21  Yes Burnard Hawthorne, FNP  ?HYDROcodone-acetaminophen (NORCO/VICODIN) 5-325 MG tablet Take 1 tablet by mouth 2 (two) times daily as needed for severe pain. ?Patient not taking: Reported  on 11/04/2021 10/29/21   Burnard Hawthorne, FNP  ?  ? ?Family History  ?Problem Relation Age of Onset  ? CAD Mother   ? Depression Mother   ? Heart disease Mother   ? Hyperlipidemia Mother   ? Hypertension Mother   ? Heart disease Father   ? Alcohol abuse Father   ? Cancer Brother 32  ?     brain  ? CAD Brother   ? Depression Brother   ? Diabetes Brother   ? Heart disease Brother   ? Hyperlipidemia Brother   ? Lupus Other   ? Sickle cell anemia Other   ? ? ?Social History  ? ?Socioeconomic History  ? Marital status: Single  ?  Spouse name: Not on file  ? Number of children: 1  ? Years of education: Not on file  ? Highest education level: Not on file  ?Occupational History  ? Not on file  ?Tobacco Use  ? Smoking status: Former  ?  Packs/day: 1.00  ?  Years: 30.00  ?  Pack years: 30.00  ?  Types: Cigarettes  ?  Quit date: 12/31/2020  ?  Years since quitting: 0.8  ? Smokeless tobacco: Never  ? Tobacco comments:  ?  Quit after being  admitted on 6/20 01/17/2021  ?Vaping Use  ? Vaping Use: Former  ?Substance and Sexual Activity  ? Alcohol use: Yes  ?  Comment: once a year  ? Drug use: Yes  ?  Types: Marijuana, Cocaine  ?  Comment: L

## 2021-11-08 NOTE — Progress Notes (Signed)
Ok to take PO blood pressure (benicar) medicine from home per Dr. Royce Macadamia. ? ?Shawna Anderson, Marsh Dolly, RN ? ?

## 2021-11-10 NOTE — Anesthesia Postprocedure Evaluation (Signed)
Anesthesia Post Note ? ?Patient: Shawna Anderson ? ?Procedure(s) Performed: ANGIOGRAM ? ?  ? ?Patient location during evaluation: PACU ?Anesthesia Type: General ?Level of consciousness: awake ?Pain management: pain level controlled ?Vital Signs Assessment: post-procedure vital signs reviewed and stable ?Respiratory status: spontaneous breathing and respiratory function stable ?Cardiovascular status: stable ?Postop Assessment: no apparent nausea or vomiting ?Anesthetic complications: no ? ? ?No notable events documented. ? ?Last Vitals:  ?Vitals:  ? 11/08/21 1245 11/08/21 1315  ?BP: (!) 158/77 128/65  ?Pulse: 98 87  ?Resp: 18 (!) 24  ?Temp:    ?SpO2: 96% 96%  ?  ?Last Pain:  ?Vitals:  ? 11/08/21 1330  ?TempSrc:   ?PainSc: 0-No pain  ? ? ?  ?  ?  ?  ?  ?  ? ?Merlinda Frederick ? ? ? ? ?

## 2021-11-11 ENCOUNTER — Encounter (HOSPITAL_COMMUNITY): Payer: Self-pay | Admitting: Interventional Radiology

## 2021-11-12 ENCOUNTER — Ambulatory Visit: Payer: Medicare Other | Admitting: Adult Health

## 2021-11-14 ENCOUNTER — Other Ambulatory Visit (HOSPITAL_COMMUNITY): Payer: Self-pay | Admitting: Interventional Radiology

## 2021-11-14 ENCOUNTER — Encounter (HOSPITAL_COMMUNITY): Payer: Self-pay

## 2021-11-14 DIAGNOSIS — I6509 Occlusion and stenosis of unspecified vertebral artery: Secondary | ICD-10-CM

## 2021-11-18 ENCOUNTER — Other Ambulatory Visit (HOSPITAL_COMMUNITY): Payer: Self-pay

## 2021-11-18 DIAGNOSIS — I771 Stricture of artery: Secondary | ICD-10-CM

## 2021-11-20 ENCOUNTER — Telehealth: Payer: Self-pay | Admitting: Student

## 2021-11-20 LAB — PLATELET INHIBITION P2Y12

## 2021-11-20 NOTE — Telephone Encounter (Signed)
P2Y12 lab value resulted as 1.   ?Per Dr. Estanislado Pandy patient instructed to continue to 1/2 tablet of Plavix daily. May proceed with intervention as planned 11/25/21. ? ?Patient verbalizes understanding.  ? ?Brynda Greathouse, MS RD PA-C ? ? ?

## 2021-11-21 ENCOUNTER — Other Ambulatory Visit: Payer: Self-pay | Admitting: Internal Medicine

## 2021-11-21 ENCOUNTER — Other Ambulatory Visit: Payer: Self-pay | Admitting: Radiology

## 2021-11-21 ENCOUNTER — Other Ambulatory Visit (HOSPITAL_COMMUNITY): Payer: Self-pay | Admitting: Interventional Radiology

## 2021-11-21 DIAGNOSIS — J309 Allergic rhinitis, unspecified: Secondary | ICD-10-CM

## 2021-11-21 DIAGNOSIS — R110 Nausea: Secondary | ICD-10-CM

## 2021-11-21 DIAGNOSIS — I671 Cerebral aneurysm, nonruptured: Secondary | ICD-10-CM

## 2021-11-22 ENCOUNTER — Inpatient Hospital Stay (HOSPITAL_COMMUNITY): Payer: Medicare Other | Admitting: Vascular Surgery

## 2021-11-22 ENCOUNTER — Encounter (HOSPITAL_COMMUNITY): Payer: Self-pay | Admitting: Interventional Radiology

## 2021-11-22 ENCOUNTER — Other Ambulatory Visit: Payer: Self-pay

## 2021-11-22 ENCOUNTER — Other Ambulatory Visit (HOSPITAL_COMMUNITY): Payer: Self-pay | Admitting: Physician Assistant

## 2021-11-22 DIAGNOSIS — M797 Fibromyalgia: Secondary | ICD-10-CM | POA: Diagnosis not present

## 2021-11-22 DIAGNOSIS — K3184 Gastroparesis: Secondary | ICD-10-CM | POA: Diagnosis not present

## 2021-11-22 DIAGNOSIS — R519 Headache, unspecified: Secondary | ICD-10-CM | POA: Diagnosis not present

## 2021-11-22 DIAGNOSIS — I6509 Occlusion and stenosis of unspecified vertebral artery: Secondary | ICD-10-CM | POA: Diagnosis not present

## 2021-11-22 DIAGNOSIS — M940 Chondrocostal junction syndrome [Tietze]: Secondary | ICD-10-CM | POA: Diagnosis not present

## 2021-11-22 DIAGNOSIS — I671 Cerebral aneurysm, nonruptured: Secondary | ICD-10-CM | POA: Diagnosis not present

## 2021-11-22 DIAGNOSIS — E1143 Type 2 diabetes mellitus with diabetic autonomic (poly)neuropathy: Secondary | ICD-10-CM | POA: Diagnosis not present

## 2021-11-22 NOTE — Progress Notes (Signed)
PCP - Terese Door, MD ?Cardiologist - [place] ? ?PPM/ICD - denies ?Device Orders - n/a ?Rep Notified - n/a ? ?Chest x-ray - n/a ?EKG - 04/30/2021 ?Stress Test - denies ?ECHO - 03/16/2019 ?Cardiac Cath - 09/11/2020 ? ?CPAP - no ? ?Fasting Blood Sugar - patient is not checking CBG at home ? ?Blood Thinner Instructions: Plavis - patient will not hold it before surgery, per MD ?Aspirin Instructions: patient will not hold it prior surgery, per MD ? ?Patient was instructed: As of today, STOP taking any Aspirin (unless otherwise instructed by your surgeon) Aleve, Naproxen, Ibuprofen, Motrin, Advil, Goody's, BC's, all herbal medications, fish oil, and all vitamins. ? ?ERAS Protcol - n/a ? ?COVID TEST- n/a - patient verbalized that she took a test on 05/11 at home which was negative ? ?Anesthesia review: yes ? ?Patient verbally denies any shortness of breath, fever, cough and chest pain during phone call ? ? ?-------------  SDW INSTRUCTIONS given: ? ?Your procedure is scheduled on Monday, May 15th, 2023. ? Report to [facility] Main Entrance "A" at 06:00 A.M., and check in at the Admitting office. ? Call this number if you have problems the morning of surgery: ? [phone] ? ? Remember: ? Do not eat or drink after midnight the night before your surgery ?  ? Take these medicines the morning of surgery with A SIP OF WATER Bentyl, Depakote, Imdur, Reglan, Protonix, Inhalers (Please bring all inhalers with you the day of surgery), Atrovent, Robaxin, Nitroglycerine, Zofran, Phenergan.   ? ? The day of surgery: ? ?           Do not wear jewelry, make up, or nail polish ?           Do not wear lotions, powders, perfumes or deodorant. ?           Do not shave 48 hours prior to surgery.   ?           Do not bring valuables to the hospital. ?           [facility] is not responsible for any belongings or valuables. ? ?Do NOT Smoke (Tobacco/Vaping) 24 hours prior to your procedure ?If you use a CPAP at night, you may bring  all equipment for your overnight stay. ?  ?Contacts, glasses, dentures or bridgework may not be worn into surgery.    ?  ?For patients admitted to the hospital, discharge time will be determined by your treatment team. ?  ?Patients discharged the day of surgery will not be allowed to drive home, and someone needs to stay with them for 24 hours. ? ?Special instructions:   ?[facility]- Preparing For Surgery ? ?Before surgery, you can play an important role. Because skin is not sterile, your skin needs to be as free of germs as possible. You can reduce the number of germs on your skin by washing with CHG (chlorahexidine gluconate) Soap before surgery.  CHG is an antiseptic cleaner which kills germs and bonds with the skin to continue killing germs even after washing.   ? ?Oral Hygiene is also important to reduce your risk of infection.  Remember - BRUSH YOUR TEETH THE MORNING OF SURGERY WITH YOUR REGULAR TOOTHPASTE ? ?Please do not use if you have an allergy to CHG or antibacterial soaps. If your skin becomes reddened/irritated stop using the CHG.  ?Do not shave (including legs and underarms) for at least 48 hours prior to first CHG shower. It is  OK to shave your face. ? ?Please follow these instructions carefully. ?  ?Shower the NIGHT BEFORE SURGERY and the MORNING OF SURGERY with DIAL Soap.  ? ?Pat yourself dry with a CLEAN TOWEL. ? ?Wear CLEAN PAJAMAS to bed the night before surgery ? ?Place CLEAN SHEETS on your bed the night of your first shower and DO NOT SLEEP WITH PETS. ? ? ?Day of Surgery: ?Please shower morning of surgery  ?Wear Clean/Comfortable clothing the morning of surgery ?Do not apply any deodorants/lotions.   ?Remember to brush your teeth WITH YOUR REGULAR TOOTHPASTE. ?  ?Questions were answered. Patient verbalized understanding of instructions.  ? ? ?    ?

## 2021-11-22 NOTE — Anesthesia Preprocedure Evaluation (Addendum)
Anesthesia Evaluation  ?Patient identified by MRN, date of birth, ID band ?Patient awake ? ? ? ?Reviewed: ?Allergy & Precautions, NPO status , Patient's Chart, lab work & pertinent test results ? ?Airway ?Mallampati: II ? ?TM Distance: >3 FB ?Neck ROM: Full ? ? ? Dental ? ?(+) Poor Dentition ?Multiple missing teeth, some loose, overall poor dentition:   ?Pulmonary ?asthma , sleep apnea , COPD,  COPD inhaler, Patient abstained from smoking., former smoker,  ? Light inspiratory expiratory wheezes ? ? ?+ wheezing ? ? ? ? ? Cardiovascular ?hypertension, + angina + CAD and + Peripheral Vascular Disease  ?Normal cardiovascular exam ?Rhythm:Regular Rate:Normal ? ? ?  ?Neuro/Psych ? Headaches, PSYCHIATRIC DISORDERS Anxiety Depression Bipolar Disorder   ? GI/Hepatic ?GERD  Poorly Controlled,(+)  ?  ? substance abuse ? cocaine use and marijuana use,   ?Endo/Other  ?diabetes, Poorly Controlled, Type 2 ? Renal/GU ?  ? ?  ?Musculoskeletal ? ? Abdominal ?  ?Peds ? Hematology ? ?(+) Blood dyscrasia, anemia ,   ?Anesthesia Other Findings ? ? Reproductive/Obstetrics ? ?  ? ? ? ? ? ? ? ? ? ? ? ? ? ?  ?  ? ? ? ? ?Anesthesia Physical ?Anesthesia Plan ? ?ASA: 4 ? ?Anesthesia Plan: General  ? ?Post-op Pain Management: Tylenol PO (pre-op)*  ? ?Induction: Intravenous and Rapid sequence ? ?PONV Risk Score and Plan: 3 and Scopolamine patch - Pre-op, Treatment may vary due to age or medical condition, Midazolam, Ondansetron and Dexamethasone ? ?Airway Management Planned: Oral ETT and Video Laryngoscope Planned ? ?Additional Equipment: Arterial line ? ?Intra-op Plan:  ? ?Post-operative Plan: Extubation in OR ? ?Informed Consent: I have reviewed the patients History and Physical, chart, labs and discussed the procedure including the risks, benefits and alternatives for the proposed anesthesia with the patient or authorized representative who has indicated his/her understanding and acceptance.  ? ? ? ?Dental  advisory given ? ?Plan Discussed with:  ? ?Anesthesia Plan Comments: (Glidescope used for last intubation by me. + arterial line. GETA. On prednisone for wheezing. Will use inhaler prior to going to OR. Advised of the risk of one of her loose teeth becoming dislodged with a risk of aspirating or swallowing a tooth. She expressed understanding and wishes to proceed. ?)  ? ? ? ?Anesthesia Quick Evaluation ? ?

## 2021-11-22 NOTE — Progress Notes (Signed)
Anesthesia Chart Review: SAME DAY WORK-UP ? ? Case: [id number] Date/Time: 11/25/21 0815  ? Procedure: ANGIOPLASTY  ? Anesthesia type: General  ? Pre-op diagnosis: stenosis  ? Location: [facility] OR RADIOLOGY ROOM / [place] [facility]  ? Surgeons: Luanne Bras, MD  ? ?  ? ? ?DISCUSSION: See 11/07/21 anesthesia APP note by Karoline Caldwell, PA-C.  He noted: ?"Follows cardiology for history of CAD s/p PCI to left circumflex with chronic stable angina, HTN, HLD, OSA (not using CPAP).  Last seen by Dr. Saunders Revel 08/21/2021.  Noted that her chest pain symptoms had improved with escalation of isosorbide mononitrate 90 mg daily.  ABIs were ordered due to patient report of leg cramping (test done 09/26/2021 showed evidence of some narrowing/blockage in small arteries in her calf/feet, however large arteries had no significant disease).  She was recommended follow-up in 6 months. ?  ?Former smoker.  30-pack-year history.  Quit 12/31/2020.  She has COPD and is maintained on Breztri. ?  ?Follows with hematology for history of waxing and waning leukocytosis and thrombocytosis.  Last seen 08/27/2021 WBC was normal and platelets mildly elevated at 475.  Myeloproliferative work-up in the past has been otherwise unremarkable.  Recommended yearly follow-up." ? ?Since 11/07/21 note, she underwent 4V cerebral arteriogram on 11/08/21 showing: ?Findings. ?1.Approximately 4.5 mm x 3 mm right P-comm artery aneurysm. ?2.  Approximately 2.5 x 2.5 mm wide necked lobulated right MCA trifurcation aneurysm. ?3.  Severe bilateral vertebral artery origin stenosis. ? ?She is now scheduled for cerebral arteriogram with possible right posterior communicating artery and right middle cerebral artery aneurysm embolization by Dr. Estanislado Pandy on 11/25/21. ? ?Per 11/20/21 IR notation, "P2Y12 lab value resulted as 1.   ?Per Dr. Estanislado Pandy patient instructed to continue to 1/2 tablet of Plavix daily. May proceed with intervention as planned 11/25/21." ?  ?She is a same-day work-up, so anesthesia  team to evaluate on the day of surgery.  Labs on arrival as indicated. ? ? ?VS:  ?BP Readings from Last 3 Encounters:  ?11/08/21 (!) 154/92  ?11/08/21 128/65  ?10/28/21 116/72  ? ?Pulse Readings from Last 3 Encounters:  ?11/08/21 98  ?11/08/21 87  ?10/28/21 84  ? ? ?PROVIDERS: ?McLean-Scocuzza, Nino Glow, MD is PCP  ?End, Harrell Gave, MD is cardiologist ? ? ?LABS: Labs on arrival as indicated/ordered. Most recent results in [facility] include: ?Lab Results  ?Component Value Date  ? WBC 9.4 11/08/2021  ? HGB 12.6 11/08/2021  ? HCT 38.6 11/08/2021  ? PLT 383 11/08/2021  ? GLUCOSE 112 (H) 11/08/2021  ? ALT 58 (H) 08/21/2021  ? AST 41 (H) 08/21/2021  ? NA 135 11/08/2021  ? K 3.6 11/08/2021  ? CL 105 11/08/2021  ? CREATININE 0.84 11/08/2021  ? BUN 20 11/08/2021  ? CO2 23 11/08/2021  ? INR 1.0 11/08/2021  ? HGBA1C 6.3 (A) 10/25/2021  ? ? ?EKG 04/30/21: NSR. Rate 71. Nonspecific t wave abnormality.  ?  ?Cath 09/11/2020: ?Conclusions: ?Moderately severe multivessel coronary artery disease including mild plaquing of the proximal and distal LAD, 40% in-stent restenosis of ostial LCx, 30% distal LCx lesion, and focal 60% mid RCA stenosis. ?Hyperdynamic left ventricular contraction with moderately elevated filling pressure. ?Significant difficulty attaining adequate sedation during procedure.  The patient would fluctuate between marked agitation and sleep every few minutes after having received 3 mg of midazolam and 75 mcg of fentanyl. ?  ?Recommendations: ?Continue current antianginal therapy with escalation as tolerated if recurrent angina occurs. ?Add furosemide 20 mg daily for gentle diuresis  in the setting of elevated LVEDP consistent with diastolic dysfunction.  This may be contributing to the patient's symptoms. ?If the patient has refractory angina, functional study to assess hemodynamic significance of LCx/RCA disease is recommended.  If catheterization/PCI is needed in the future, involvement of anesthesia will need to be  considered. ?  ?TTE 03/16/2019: ? 1. The left ventricle has normal systolic function with an ejection  ?fraction of 60-65%. The cavity size was normal. Left ventricular diastolic  ?Doppler parameters are consistent with impaired relaxation.  ? 2. The right ventricle has normal systolic function. The cavity was  ?normal. There is no increase in right ventricular wall thickness.Unable to  ?estimate RVSP.  ? ?Past Medical History:  ?Diagnosis Date  ? Anxiety   ? Asthma   ? Bipolar 1 disorder ([place])   ? Bipolar disorder ([place])   ? CAD (coronary artery disease)   ? s/p stent BMS OM Cx  ? Cervical herniated disc 04/12/2016  ? COPD (chronic obstructive pulmonary disease) ([place])   ? Depression   ? Diabetes mellitus with gastroparesis ([place])   ? Diabetes mellitus without complication ([place])   ? no meds, diet controlled  ? Diverticulitis   ? GERD (gastroesophageal reflux disease)   ? Glaucoma   ? History of blood transfusion   ? Hyperlipidemia   ? Hypertension   ? OSA (obstructive sleep apnea)   ? not using cpap   ? Plantar fasciitis   ? b/l feet s/p steroid shots w/o help and left surgery w/o help   ? Spinal headache   ? UTI (urinary tract infection)   ? ? ?Past Surgical History:  ?Procedure Laterality Date  ? ABDOMINAL HYSTERECTOMY    ? ABDOMINAL SURGERY  1995  ? Bowel resection.  ? CARDIAC CATHETERIZATION N/A 04/14/2016  ? Procedure: Left Heart Cath and Coronary Angiography;  Surgeon: Burnell Blanks, MD;  Location: [place] [facility];  Service: Cardiovascular;  Laterality: N/A;  ? CARDIAC SURGERY    ? COLONOSCOPY WITH PROPOFOL N/A 07/11/2019  ? Procedure: COLONOSCOPY WITH PROPOFOL;  Surgeon: Jonathon Bellows, MD;  Location: [facility] ENDOSCOPY;  Service: Gastroenterology;  Laterality: N/A;  ? COLONOSCOPY WITH PROPOFOL N/A 07/29/2019  ? Procedure: COLONOSCOPY WITH PROPOFOL;  Surgeon: Jonathon Bellows, MD;  Location: [facility] ENDOSCOPY;  Service: Gastroenterology;  Laterality: N/A;  ? CORONARY ANGIOPLASTY WITH STENT PLACEMENT  2010  ? Drug  eluting stent  ? ESOPHAGOGASTRODUODENOSCOPY (EGD) WITH PROPOFOL N/A 07/11/2019  ? Procedure: ESOPHAGOGASTRODUODENOSCOPY (EGD) WITH PROPOFOL;  Surgeon: Jonathon Bellows, MD;  Location: [facility] ENDOSCOPY;  Service: Gastroenterology;  Laterality: N/A;  ? IR 3D INDEPENDENT WKST  11/08/2021  ? IR ANGIO INTRA EXTRACRAN SEL COM CAROTID INNOMINATE BILAT MOD SED  11/08/2021  ? IR ANGIO VERTEBRAL SEL VERTEBRAL BILAT MOD SED  11/08/2021  ? IR RADIOLOGIST EVAL & MGMT  10/16/2021  ? IR US GUIDE VASC ACCESS RIGHT  11/08/2021  ? LEFT HEART CATH AND CORONARY ANGIOGRAPHY Left 09/11/2020  ? Procedure: LEFT HEART CATH AND CORONARY ANGIOGRAPHY;  Surgeon: Nelva Bush, MD;  Location: [place] [facility];  Service: Cardiovascular;  Laterality: Left;  ? OVARIAN CYST REMOVAL    ? RADIOLOGY WITH ANESTHESIA N/A 11/08/2021  ? Procedure: Cyril Loosen;  Surgeon: Luanne Bras, MD;  Location: [place];  Service: Radiology;  Laterality: N/A;  ? ? ?MEDICATIONS: ?No current facility-administered medications for this encounter.  ? ? albuterol (PROAIR HFA) 108 (90 Base) MCG/ACT inhaler  ? amLODipine (NORVASC) 10 MG tablet  ? aspirin 81 MG chewable  tablet  ? atorvastatin (LIPITOR) 80 MG tablet  ? Budeson-Glycopyrrol-Formoterol (BREZTRI AEROSPHERE) 160-9-4.8 MCG/ACT AERO  ? clopidogrel (PLAVIX) 75 MG tablet  ? dicyclomine (BENTYL) 20 MG tablet  ? divalproex (DEPAKOTE) 500 MG DR tablet  ? ezetimibe (ZETIA) 10 MG tablet  ? isosorbide mononitrate (IMDUR) 30 MG 24 hr tablet  ? methocarbamol (ROBAXIN) 500 MG tablet  ? metoCLOPramide (REGLAN) 5 MG tablet  ? mometasone (ELOCON) 0.1 % cream  ? nitroGLYCERIN (NITROSTAT) 0.4 MG SL tablet  ? olmesartan-hydrochlorothiazide (BENICAR HCT) 20-12.5 MG tablet  ? pantoprazole (PROTONIX) 40 MG tablet  ? REPATHA SURECLICK 445 MG/ML SOAJ  ? topiramate (TOPAMAX) 25 MG tablet  ? ipratropium (ATROVENT) 0.06 % nasal spray  ? ondansetron (ZOFRAN-ODT) 4 MG disintegrating tablet  ? promethazine (PHENERGAN) 25 MG tablet  ? ? albuterol  (PROVENTIL) (2.5 MG/3ML) 0.083% nebulizer solution 2.5 mg  ? ? ?Myra Gianotti, PA-C ?Surgical Short Stay/Anesthesiology ?[facility] Phone [phone] ?[facility] Phone [phone] ?11/22/2021 6:48 PM ? ? ? ? ? ? ?

## 2021-11-24 DIAGNOSIS — J45901 Unspecified asthma with (acute) exacerbation: Secondary | ICD-10-CM | POA: Diagnosis not present

## 2021-11-24 DIAGNOSIS — J302 Other seasonal allergic rhinitis: Secondary | ICD-10-CM | POA: Diagnosis not present

## 2021-11-25 ENCOUNTER — Ambulatory Visit (HOSPITAL_COMMUNITY)
Admission: RE | Admit: 2021-11-25 | Discharge: 2021-11-25 | Disposition: A | Discharge status: Home / Self Care | Payer: Medicare Other | Source: Ambulatory Visit | Attending: Interventional Radiology | Admitting: Interventional Radiology

## 2021-11-25 ENCOUNTER — Other Ambulatory Visit: Payer: Self-pay

## 2021-11-25 ENCOUNTER — Encounter (HOSPITAL_COMMUNITY)
Admission: RE | Disposition: A | Discharge status: Home / Self Care | Payer: Self-pay | Source: Ambulatory Visit | Attending: Interventional Radiology

## 2021-11-25 ENCOUNTER — Encounter (HOSPITAL_COMMUNITY): Payer: Self-pay | Admitting: Interventional Radiology

## 2021-11-25 ENCOUNTER — Telehealth: Payer: Self-pay

## 2021-11-25 ENCOUNTER — Other Ambulatory Visit: Payer: Self-pay | Admitting: Internal Medicine

## 2021-11-25 ENCOUNTER — Inpatient Hospital Stay (HOSPITAL_COMMUNITY)
Admission: RE | Admit: 2021-11-25 | Discharge: 2021-11-25 | Disposition: A | Discharge status: Home / Self Care | Payer: Medicare Other | Source: Ambulatory Visit | Attending: Interventional Radiology | Admitting: Interventional Radiology

## 2021-11-25 DIAGNOSIS — Z9861 Coronary angioplasty status: Secondary | ICD-10-CM | POA: Insufficient documentation

## 2021-11-25 DIAGNOSIS — E785 Hyperlipidemia, unspecified: Secondary | ICD-10-CM | POA: Insufficient documentation

## 2021-11-25 DIAGNOSIS — R110 Nausea: Secondary | ICD-10-CM

## 2021-11-25 DIAGNOSIS — Z7902 Long term (current) use of antithrombotics/antiplatelets: Secondary | ICD-10-CM | POA: Insufficient documentation

## 2021-11-25 DIAGNOSIS — I671 Cerebral aneurysm, nonruptured: Secondary | ICD-10-CM | POA: Insufficient documentation

## 2021-11-25 DIAGNOSIS — Z87891 Personal history of nicotine dependence: Secondary | ICD-10-CM | POA: Insufficient documentation

## 2021-11-25 DIAGNOSIS — I6503 Occlusion and stenosis of bilateral vertebral arteries: Secondary | ICD-10-CM | POA: Insufficient documentation

## 2021-11-25 DIAGNOSIS — J449 Chronic obstructive pulmonary disease, unspecified: Secondary | ICD-10-CM | POA: Insufficient documentation

## 2021-11-25 DIAGNOSIS — I2510 Atherosclerotic heart disease of native coronary artery without angina pectoris: Secondary | ICD-10-CM | POA: Insufficient documentation

## 2021-11-25 DIAGNOSIS — Z538 Procedure and treatment not carried out for other reasons: Secondary | ICD-10-CM | POA: Insufficient documentation

## 2021-11-25 DIAGNOSIS — Z7952 Long term (current) use of systemic steroids: Secondary | ICD-10-CM | POA: Diagnosis not present

## 2021-11-25 DIAGNOSIS — Z01812 Encounter for preprocedural laboratory examination: Secondary | ICD-10-CM | POA: Insufficient documentation

## 2021-11-25 DIAGNOSIS — I25118 Atherosclerotic heart disease of native coronary artery with other forms of angina pectoris: Secondary | ICD-10-CM | POA: Insufficient documentation

## 2021-11-25 DIAGNOSIS — J309 Allergic rhinitis, unspecified: Secondary | ICD-10-CM

## 2021-11-25 DIAGNOSIS — I10 Essential (primary) hypertension: Secondary | ICD-10-CM | POA: Insufficient documentation

## 2021-11-25 DIAGNOSIS — R252 Cramp and spasm: Secondary | ICD-10-CM | POA: Insufficient documentation

## 2021-11-25 DIAGNOSIS — Z7982 Long term (current) use of aspirin: Secondary | ICD-10-CM | POA: Diagnosis not present

## 2021-11-25 DIAGNOSIS — Z79899 Other long term (current) drug therapy: Secondary | ICD-10-CM | POA: Insufficient documentation

## 2021-11-25 DIAGNOSIS — G4733 Obstructive sleep apnea (adult) (pediatric): Secondary | ICD-10-CM | POA: Insufficient documentation

## 2021-11-25 HISTORY — DX: Other reaction to spinal and lumbar puncture: G97.1

## 2021-11-25 LAB — CBC WITH DIFFERENTIAL/PLATELET
Abs Immature Granulocytes: 0.07 10*3/uL (ref 0.00–0.07)
Basophils Absolute: 0 10*3/uL (ref 0.0–0.1)
Basophils Relative: 0 %
Eosinophils Absolute: 0.1 10*3/uL (ref 0.0–0.5)
Eosinophils Relative: 1 %
HCT: 35.6 % — ABNORMAL LOW (ref 36.0–46.0)
Hemoglobin: 12 g/dL (ref 12.0–15.0)
Immature Granulocytes: 1 %
Lymphocytes Relative: 27 %
Lymphs Abs: 3.3 10*3/uL (ref 0.7–4.0)
MCH: 30 pg (ref 26.0–34.0)
MCHC: 33.7 g/dL (ref 30.0–36.0)
MCV: 89 fL (ref 80.0–100.0)
Monocytes Absolute: 1.3 10*3/uL — ABNORMAL HIGH (ref 0.1–1.0)
Monocytes Relative: 10 %
Neutro Abs: 7.7 10*3/uL (ref 1.7–7.7)
Neutrophils Relative %: 61 %
Platelets: 420 10*3/uL — ABNORMAL HIGH (ref 150–400)
RBC: 4 MIL/uL (ref 3.87–5.11)
RDW: 14.5 % (ref 11.5–15.5)
WBC: 12.4 10*3/uL — ABNORMAL HIGH (ref 4.0–10.5)
nRBC: 0 % (ref 0.0–0.2)

## 2021-11-25 LAB — BASIC METABOLIC PANEL
Anion gap: 9 (ref 5–15)
BUN: 13 mg/dL (ref 6–20)
CO2: 21 mmol/L — ABNORMAL LOW (ref 22–32)
Calcium: 8.9 mg/dL (ref 8.9–10.3)
Chloride: 107 mmol/L (ref 98–111)
Creatinine, Ser: 0.79 mg/dL (ref 0.44–1.00)
GFR, Estimated: >60 mL/min (ref >60)
Glucose, Bld: 134 mg/dL — ABNORMAL HIGH (ref 70–99)
Potassium: 3 mmol/L — ABNORMAL LOW (ref 3.5–5.1)
Sodium: 137 mmol/L (ref 135–145)

## 2021-11-25 LAB — PROTIME-INR
INR: 0.9 (ref 0.8–1.2)
Prothrombin Time: 12.3 seconds (ref 11.4–15.2)

## 2021-11-25 LAB — GLUCOSE, CAPILLARY: Glucose-Capillary: 123 mg/dL — ABNORMAL HIGH (ref 70–99)

## 2021-11-25 SURGERY — IR WITH ANESTHESIA
Anesthesia: General

## 2021-11-25 MED ORDER — CLOPIDOGREL BISULFATE 75 MG PO TABS: 75.0000 mg | ORAL_TABLET | ORAL | Status: DC

## 2021-11-25 MED ORDER — LACTATED RINGERS IV SOLN: INTRAVENOUS | Status: DC

## 2021-11-25 MED ORDER — INSULIN ASPART 100 UNIT/ML IJ SOLN: 0.0000 [IU] | INTRAMUSCULAR | Status: DC | PRN

## 2021-11-25 MED ORDER — VANCOMYCIN HCL IN DEXTROSE 1-5 GM/200ML-% IV SOLN
1000.0000 mg | Freq: Once | INTRAVENOUS | Status: DC
  Filled 2021-11-25 (×2): qty 200

## 2021-11-25 MED ORDER — VANCOMYCIN HCL 1000 MG IV SOLR: 1000.0000 mg | INTRAVENOUS | Status: DC

## 2021-11-25 MED ORDER — ORAL CARE MOUTH RINSE: 15.0000 mL | Freq: Once | OROMUCOSAL | Status: AC

## 2021-11-25 MED ORDER — CHLORHEXIDINE GLUCONATE 0.12 % MT SOLN
15.0000 mL | Freq: Once | OROMUCOSAL | Status: AC
  Administered 2021-11-25: 15 mL via OROMUCOSAL
  Filled 2021-11-25: qty 15

## 2021-11-25 MED ORDER — NIMODIPINE 30 MG PO CAPS
0.0000 mg | ORAL_CAPSULE | ORAL | Status: AC
  Administered 2021-11-25: 60 mg via ORAL
  Filled 2021-11-25: qty 2

## 2021-11-25 MED ORDER — ASPIRIN EC 325 MG PO TBEC
325.0000 mg | DELAYED_RELEASE_TABLET | ORAL | Status: DC
Start: 2021-11-25 — End: 2021-11-25

## 2021-11-25 MED ORDER — IPRATROPIUM BROMIDE 0.06 % NA SOLN: NASAL | 4 refills | Status: AC

## 2021-11-25 MED ORDER — SODIUM CHLORIDE 0.9 % IV SOLN
INTRAVENOUS | Status: DC
Start: 2021-11-25 — End: 2021-11-25

## 2021-11-25 MED ORDER — SCOPOLAMINE 1 MG/3DAYS TD PT72
1.0000 | MEDICATED_PATCH | TRANSDERMAL | Status: DC
  Administered 2021-11-25: 1.5 mg via TRANSDERMAL
  Filled 2021-11-25: qty 1

## 2021-11-25 NOTE — Telephone Encounter (Signed)
Patient states 11/23/2021, she was experiencing a tickle in her throat and nose.  Patient states 11/24/2021, her chest felt funny, her nose was clogged, she started wheezing and having shortness of breath.  Patient states she went to [facility] and they said it was upper respiratory.  Patient states the doctor at [facility] gave her five days worth of prednisone and told her to follow-up with her PCP.  Patient states she has had bronchitis a lot and she states she believes this is what she has. ? ?Patient states she went to the hospital this morning to have her angioplasty, and Luanne Bras, MD,  told her she will need to reschedule this procedure since she is on prednisone.  Patient states Luanne Bras, MD told her she will need to see her PCP before having the procedure. ?

## 2021-11-25 NOTE — Telephone Encounter (Signed)
Kim  ?If pt sick please change visit to virtual  ?Thank you ? ?

## 2021-11-25 NOTE — Progress Notes (Signed)
Per Dr. Estanislado Pandy, case canceled, because patient started prednisone for bronchitis recently.  Patient discharged home, ambulated out of department. ?

## 2021-11-25 NOTE — Progress Notes (Signed)
? ?  Pt was scheduled today for Rt PCOM and Rt MCA aneurysm embolization with Dr Estanislado Pandy. ?He has seen and examined pt. ? ?Pt states she was seen in an Urgent Care in [place] 2 days ago for SOB. ?She was Rx Prednisone dosepak ?Much better now ?Less SOB; denies cough ? ?We have no access to notes from this facility. ? ?Dr Marjory Lies has examined pt ?We will HOLD on this procedure today ?Pt is to continue Plavix half tablet daily and ASA 81 mg daily ?She is to finish dosepak --- 3 days from now ?Please see primary care MD for re evaluation of SOB ? ?Reschedule this procedure after re evaluation with her MD ? ?Pt is aware she will hear form IR schedulers for new appt date and time ? ?

## 2021-11-25 NOTE — Telephone Encounter (Signed)
Patient has been scheduled to see Dr. Aundra Dubin on 11/28/2021. ?

## 2021-11-25 NOTE — Progress Notes (Signed)
Pt took '81mg'$  aspirin at 0730. Per Pam turpin, no more aspirin should be given.  ?

## 2021-11-26 MED ORDER — PROMETHAZINE HCL 25 MG PO TABS: ORAL_TABLET | ORAL | 2 refills | Status: AC

## 2021-11-28 ENCOUNTER — Ambulatory Visit (INDEPENDENT_AMBULATORY_CARE_PROVIDER_SITE_OTHER): Payer: Medicare Other | Admitting: Internal Medicine

## 2021-11-28 ENCOUNTER — Encounter: Payer: Self-pay | Admitting: Internal Medicine

## 2021-11-28 ENCOUNTER — Ambulatory Visit (INDEPENDENT_AMBULATORY_CARE_PROVIDER_SITE_OTHER): Payer: Medicare Other

## 2021-11-28 ENCOUNTER — Encounter (HOSPITAL_COMMUNITY): Payer: Self-pay | Admitting: Interventional Radiology

## 2021-11-28 ENCOUNTER — Other Ambulatory Visit: Payer: Self-pay

## 2021-11-28 VITALS — BP 120/80 | HR 87 | Temp 98.5°F | Ht 63.0 in | Wt 187.2 lb

## 2021-11-28 DIAGNOSIS — J4520 Mild intermittent asthma, uncomplicated: Secondary | ICD-10-CM | POA: Diagnosis not present

## 2021-11-28 DIAGNOSIS — J449 Chronic obstructive pulmonary disease, unspecified: Secondary | ICD-10-CM | POA: Diagnosis not present

## 2021-11-28 DIAGNOSIS — J42 Unspecified chronic bronchitis: Secondary | ICD-10-CM | POA: Diagnosis not present

## 2021-11-28 MED ORDER — IPRATROPIUM-ALBUTEROL 0.5-2.5 (3) MG/3ML IN SOLN: 3.0000 mL | Freq: Four times a day (QID) | RESPIRATORY_TRACT | 0 refills | Status: AC | PRN

## 2021-11-28 MED ORDER — ALBUTEROL SULFATE HFA 108 (90 BASE) MCG/ACT IN AERS: 1.0000 | INHALATION_SPRAY | RESPIRATORY_TRACT | 3 refills | Status: AC | PRN

## 2021-11-28 MED ORDER — AZITHROMYCIN 250 MG PO TABS: ORAL_TABLET | ORAL | 0 refills | Status: AC

## 2021-11-28 MED ORDER — METHYLPREDNISOLONE 4 MG PO TBPK: ORAL_TABLET | ORAL | 0 refills | Status: DC

## 2021-11-28 NOTE — Progress Notes (Signed)
Spoke with pt for pre-op call. Pt was scheduled for the procedure on 11/25/21. Arrived here for the procedure but it was cancelled due to her wheezing. She has seen her PCP today and was prescribed a Prednisone dose pack, Duoneb, Azithromycin and told to use Probiotics and Mylanta for "sulfa burps". She had a CXR done today which has been read and it was ok. Pt is diabetic. Does not check her blood sugar at home. Last A1C was 6.3 on 10/25/21.   Pt states she is taking her Aspirin and Plavix as prescribed.   Shower instructions given to pt.

## 2021-11-28 NOTE — Patient Instructions (Addendum)
Pepcid 20 mg daily  Add in pre/probiotic  Gas x  Call if gas continues Dr. Marius Ditch [phone] Not available [place] [id number]      Chronic Bronchitis, Adult  Chronic bronchitis is inflammation inside of the main airways (bronchi) that come off the windpipe (trachea) in the lungs. The swelling causes the airways to narrow and make more mucus than normal. This can make it hard to breathe and may cause coughing or noisy breathing (wheezing). This condition is a type of chronic obstructive pulmonary disease (COPD). Chronic bronchitis is often associated with other chronic respiratory conditions, such as emphysema, asthma, bronchiectasis, or cystic fibrosis. Chronic bronchitis is a long-term (chronic) condition. It is defined as a chronic cough with mucus (sputum) production: For at least 3 months of the year. For 2 years in a row. People with chronic bronchitis are more likely to get colds and other infections in the nose, throat, or airways. What are the causes? This condition is most often caused by: A history of smoking. Exposure to secondhand smoke or a smoky area for a long period of time. Frequent lung infections. Long-term exposure to certain fumes or chemicals that irritate the lungs. What are the signs or symptoms? Symptoms of chronic bronchitis may include: A cough that brings up mucus (productive cough). A whistling sound when you breathe (wheezing). Shortness of breath. Chest tightness or soreness. Fever or chills. Colds or respiratory infections that go away and return. How is this diagnosed? Your health care provider may diagnose this condition based on your signs and symptoms, especially if you have a cough that lasts a long time or keeps coming back. This condition may be diagnosed based on: Your symptoms and medical history. A physical exam, including listening to your lungs. Tests, such as: Testing a sputum sample. Blood  tests. A chest X-ray. Tests of lung (pulmonary) function. How is this treated? There is no cure for chronic bronchitis. Treatment may help control your symptoms. This includes: Drinking fluids. This may help thin your mucus so it is easier to cough up. Mucus-clearing techniques. Your health care provider will show you which techniques are best for you. Medicines such as: Inhaled medicine (inhaler) to improve air flow in and out of your lungs. Antibiotics to treat or prevent bacterial lung infections. Mucus-thinning medicines. Pulmonary rehabilitation. This is a program that helps you learn how to manage your breathing problem. The program may include exercise, education, counseling, treatment, and support. Using oxygen therapy, if your blood oxygen level is very low. Follow these instructions at home: Medicines Take over-the-counter and prescription medicines only as told by your health care provider. If you were prescribed an antibiotic medicine, take it as told by your health care provider. Do not stop taking the antibiotic even if you start to feel better. Lifestyle  Do not use any products that contain nicotine or tobacco. These products include cigarettes, chewing tobacco, and vaping devices, such as e-cigarettes. If you need help quitting, ask your health care provider. Stay away from other people's smoke (secondhand smoke) and any irritants that make you cough more, such as chemical fumes. Eat a healthy diet and get regular exercise. Talk with your health care provider about what activities are safe for you. Return to normal activities as told by your health care provider. Ask your health care provider what activities are safe for you. Preventing infections Stay up to date on all immunizations, including the pneumonia and flu vaccines. Wash  your hands often with soap and water for at least 20 seconds. If soap and water are not available, use hand sanitizer. Avoid contact with people  who have symptoms of a cold or the flu. Keep your environment free from any known allergens such as dust, mold, pets, and pollen. General instructions Get plenty of rest. Drink enough fluids to keep your urine pale yellow. Use oxygen therapy at home as directed. Follow instructions from your health care provider about how to use oxygen safely and take steps to prevent fire. Do not smoke while using oxygen or allow others to smoke in your home. Keep all follow-up visits. This is important. Contact a health care provider if: Your shortness of breath or coughing gets worse even when you take medicine. Your mucus gets thicker or changes color. You are not able to cough up your mucus. You have a fever. Get help right away if: You cough up blood. You have trouble breathing. You have chest pain. You feel dizzy or confused. These symptoms may represent a serious problem that is an emergency. Do not wait to see if the symptoms will go away. Get medical help right away. Call your local emergency services (911 in the [place] Do not drive yourself to the hospital. Summary Chronic bronchitis is inflammation inside of the main airways (bronchi) that come off the windpipe (trachea) in the lungs. The swelling causes the airways to narrow and make more mucus than normal. Chronic bronchitis is a long-term (chronic) condition. It is defined as a chronic cough with mucus (sputum) production for at least 3 months of the year for 2 years in a row. If you were prescribed an antibiotic medicine, take it as told by your health care provider. Do not stop taking the antibiotic even if you start to feel better. Do not use any products that contain nicotine or tobacco. These products include cigarettes, chewing tobacco, and vaping devices, such as e-cigarettes. If you need help quitting, ask your health care provider. This information is not intended to replace advice given to you by your health care provider. Make sure  you discuss any questions you have with your health care provider. Document Revised: 10/31/2020 Document Reviewed: 10/31/2020 [employer] Patient Education  [place] is a feeling of pain, discomfort, burning, or fullness in the upper part of your abdomen. It can come and go. It may occur frequently or rarely. Indigestion tends to occur while you are eating or right after you have finished eating. It may be worse at night and while bending over or lying down. Indigestion may be a symptom of an underlying digestive condition. Follow these instructions at home: Eating and drinking  Follow an eating plan as recommended by your health care provider. Avoid certain foods and drinks as told by your health care provider. This may include: Chocolate and cocoa. Peppermint and mint flavorings. Garlic and onions. Horseradish. Spicy and acidic foods, including peppers, chili powder, curry powder, vinegar, hot sauces, and barbecue sauce. Citrus fruits, such as oranges, lemons, and limes. Tomato-based foods, such as red sauce, chili, salsa, and pizza with red sauce. Fried and fatty foods, such as donuts, french fries, potato chips, and high-fat dressings. High-fat meats, such as hot dogs and fatty cuts of red and white meats, such as rib eye steak, sausage, ham, and bacon. High-fat dairy items, such as whole milk, butter, and cream cheese. Coffee and tea, with or without caffeine. Drinks that contain alcohol. Energy drinks and sports  drinks. Carbonated drinks or sodas. Citrus fruit juices. Eat small, frequent meals instead of large meals. Avoid drinking large amounts of liquid with your meals. Avoid eating meals during the 2-3 hours before bedtime. Avoid lying down right after you eat. Avoid exercise for 2 hours after you eat. Lifestyle     Maintain a healthy weight. Ask your health care provider what weight is healthy for you. If you need to lose weight, work  with your health care provider to do so safely. Exercise for at least 30 minutes on 5 or more days each week, or as told by your health care provider. Avoid exercises that include bending forward. This can make your symptoms worse. Wear loose-fitting clothing. Do not wear anything tight around your waist that causes pressure on your abdomen. Do not use any products that contain nicotine or tobacco. These products include cigarettes, chewing tobacco, and vaping devices, such as e-cigarettes. These can make symptoms worse. If you need help quitting, ask your health care provider. Raise (elevate) the head of your bed about 6 inches (15 cm) when you sleep. You can use a wedge to do this. Try to reduce your stress, such as with yoga or meditation. If you need help reducing stress, ask your health care provider. General instructions Take over-the-counter and prescription medicines only as told by your health care provider. Do not take aspirin or NSAIDs, such as ibuprofen, unless your health care provider told you to take them. Pay attention to any changes in your symptoms. Keep all follow-up visits. This is important. Contact a health care provider if: You have new symptoms. You have unexplained weight loss. You have difficulty swallowing, or it hurts to swallow. Your symptoms do not improve with treatment. Your symptoms last for more than 2 days. You have a fever. You vomit. Get help right away if: You suddenly have pain in your arms, neck, jaw, teeth, or back. You suddenly feel sweaty, dizzy, or light-headed. You faint. You have chest pain or shortness of breath. You cannot stop vomiting, or you vomit blood. Your stool is bloody or black. You have severe pain in your abdomen. These symptoms may represent a serious problem that is an emergency. Do not wait to see if the symptoms will go away. Get medical help right away. Call your local emergency services (911 in the [place] Do not drive  yourself to the hospital. Summary Indigestion is a feeling of pain, discomfort, burning, or fullness in the upper part of your abdomen. It tends to occur while you are eating or right after you have finished eating. Follow an eating plan and other lifestyle changes as told by your health care provider. Take over-the-counter and prescription medicines only as told by your health care provider. Do not take aspirin or NSAIDs, such as ibuprofen, unless your health care provider told you to do so. Contact your health care provider if your symptoms do not get better or they get worse. Some symptoms may represent a serious problem that is an emergency. Do not wait to see if the symptoms will go away. Get medical help right away. This information is not intended to replace advice given to you by your health care provider. Make sure you discuss any questions you have with your health care provider. Document Revised: 01/04/2020 Document Reviewed: 01/04/2020 [employer] Patient Education  [employer].

## 2021-11-28 NOTE — Progress Notes (Signed)
Chief Complaint  Patient presents with   Acute Visit    Upper respiratory diagnosis/sulphur burps   F/u  1. Chronic bronchitis not smoking but around 2nd hand smoke using breztri and albuterol and with wheezing cough has mucinex otc went to urgent care 3 days ago and tested neg covid at home  She has surgery sch for brain aneurysm 12/02/21 and it had been canceled 11/26/21 due to her breathing and respirtatory status   2. Sulfa burps x 2 weeks   Review of Systems  Constitutional:  Negative for weight loss.  HENT:  Negative for hearing loss.   Eyes:  Negative for blurred vision.  Respiratory:  Negative for shortness of breath.   Cardiovascular:  Negative for chest pain.  Gastrointestinal:  Negative for abdominal pain and blood in stool.  Genitourinary:  Negative for dysuria.  Musculoskeletal:  Negative for falls and joint pain.  Skin:  Negative for rash.  Neurological:  Negative for headaches.  Psychiatric/Behavioral:  Negative for depression.   Past Medical History:  Diagnosis Date   Anxiety    Asthma    Bipolar 1 disorder ([place])    Bipolar disorder ([place])    CAD (coronary artery disease)    s/p stent BMS OM Cx   Cervical herniated disc 04/12/2016   COPD (chronic obstructive pulmonary disease) (HCC)    Depression    Diabetes mellitus with gastroparesis (HCC)    Diabetes mellitus without complication (HCC)    no meds, diet controlled   Diverticulitis    GERD (gastroesophageal reflux disease)    Glaucoma    History of blood transfusion    Hyperlipidemia    Hypertension    OSA (obstructive sleep apnea)    not using cpap    Plantar fasciitis    b/l feet s/p steroid shots w/o help and left surgery w/o help    Spinal headache    UTI (urinary tract infection)    Past Surgical History:  Procedure Laterality Date   ABDOMINAL HYSTERECTOMY     ABDOMINAL SURGERY  1995   Bowel resection.   CARDIAC CATHETERIZATION N/A 04/14/2016   Procedure: Left Heart Cath and Coronary  Angiography;  Surgeon: Burnell Blanks, MD;  Location: [facility];  Service: Cardiovascular;  Laterality: N/A;   CARDIAC SURGERY     COLONOSCOPY WITH PROPOFOL N/A 07/11/2019   Procedure: COLONOSCOPY WITH PROPOFOL;  Surgeon: Jonathon Bellows, MD;  Location: [facility] ENDOSCOPY;  Service: Gastroenterology;  Laterality: N/A;   COLONOSCOPY WITH PROPOFOL N/A 07/29/2019   Procedure: COLONOSCOPY WITH PROPOFOL;  Surgeon: Jonathon Bellows, MD;  Location: [facility] ENDOSCOPY;  Service: Gastroenterology;  Laterality: N/A;   CORONARY ANGIOPLASTY WITH STENT PLACEMENT  2010   Drug eluting stent   ESOPHAGOGASTRODUODENOSCOPY (EGD) WITH PROPOFOL N/A 07/11/2019   Procedure: ESOPHAGOGASTRODUODENOSCOPY (EGD) WITH PROPOFOL;  Surgeon: Jonathon Bellows, MD;  Location: [facility] ENDOSCOPY;  Service: Gastroenterology;  Laterality: N/A;   IR 3D INDEPENDENT WKST  11/08/2021   IR ANGIO INTRA EXTRACRAN SEL COM CAROTID INNOMINATE BILAT MOD SED  11/08/2021   IR ANGIO VERTEBRAL SEL VERTEBRAL BILAT MOD SED  11/08/2021   IR RADIOLOGIST EVAL & MGMT  10/16/2021   IR US GUIDE VASC ACCESS RIGHT  11/08/2021   LEFT HEART CATH AND CORONARY ANGIOGRAPHY Left 09/11/2020   Procedure: LEFT HEART CATH AND CORONARY ANGIOGRAPHY;  Surgeon: Nelva Bush, MD;  Location: [facility];  Service: Cardiovascular;  Laterality: Left;   OVARIAN CYST REMOVAL     RADIOLOGY WITH ANESTHESIA N/A 11/08/2021  Procedure: Cyril Loosen;  Surgeon: Luanne Bras, MD;  Location: [employer];  Service: Radiology;  Laterality: N/A;   Family History  Problem Relation Age of Onset   CAD Mother    Depression Mother    Heart disease Mother    Hyperlipidemia Mother    Hypertension Mother    Heart disease Father    Alcohol abuse Father    Cancer Brother 16       brain   CAD Brother    Depression Brother    Diabetes Brother    Heart disease Brother    Hyperlipidemia Brother    Lupus Other    Sickle cell anemia Other    Social History   Socioeconomic History   Marital  status: Single    Spouse name: Not on file   Number of children: 1   Years of education: Not on file   Highest education level: Not on file  Occupational History   Not on file  Tobacco Use   Smoking status: Former    Packs/day: 1.00    Years: 30.00    Pack years: 30.00    Types: Cigarettes    Quit date: 12/31/2020    Years since quitting: 0.9   Smokeless tobacco: Never   Tobacco comments:    Quit after being admitted on 6/20 01/17/2021  Vaping Use   Vaping Use: Former  Substance and Sexual Activity   Alcohol use: Yes    Comment: once a year   Drug use: Yes    Types: Marijuana, Cocaine    Comment: Last dose Marijuana Daily, last use cocaine months ago   Sexual activity: Not Currently  Other Topics Concern   Not on file  Social History Narrative   From [place] now living in [place]    1 son    No guns    Wears seat belt   Safe in relationship    Social Determinants of Health   Financial Resource Strain: Low Risk    Difficulty of Paying Living Expenses: Not very hard  Food Insecurity: No Food Insecurity   Worried About Charity fundraiser in the Last Year: Never true   Arboriculturist in the Last Year: Never true  Transportation Needs: No Transportation Needs   Lack of Transportation (Medical): No   Lack of Transportation (Non-Medical): No  Physical Activity: Not on file  Stress: No Stress Concern Present   Feeling of Stress : Not at all  Social Connections: Unknown   Frequency of Communication with Friends and Family: More than three times a week   Frequency of Social Gatherings with Friends and Family: More than three times a week   Attends Religious Services: Not on Electrical engineer or Organizations: Not on file   Attends Archivist Meetings: Not on file   Marital Status: Not on file  Intimate Partner Violence: Not At Risk   Fear of Current or Ex-Partner: No   Emotionally Abused: No   Physically Abused: No   Sexually Abused:  No   Current Meds  Medication Sig   azithromycin (ZITHROMAX) 250 MG tablet Take 2 tablets on day 1, then 1 tablet daily on days 2 through 5   baclofen (LIORESAL) 10 MG tablet Take by mouth.   ipratropium-albuterol (DUONEB) 0.5-2.5 (3) MG/3ML SOLN Take 3 mLs by nebulization every 6 (six) hours as needed.   levocetirizine (XYZAL) 5 MG tablet    methylPREDNISolone (MEDROL DOSEPAK) 4  MG TBPK tablet Use as directed with food   [DISCONTINUED] cephALEXin (KEFLEX) 500 MG capsule Take by mouth.   Allergies  Allergen Reactions   Ativan [Lorazepam]     Pt states it makes her tongue do weird things    Augmentin [Amoxicillin-Pot Clavulanate]     Upset stomach    Lactose Intolerance (Gi)    Latex Other (See Comments)    Patient stated that she was told by her doctor that she is "allergic to" this   Tape Other (See Comments)    Patient stated that she was told by her doctor that she is "allergic to" this   Xifaxan [Rifaximin]     Pt believes this is contributing to her abdominal pain/discomfort   Drixoral Allergy Sinus [Dexbromphen-Pse-Apap Er] Rash   Recent Results (from the past 2160 hour(s))  HM DIABETES EYE EXAM     Status: None   Collection Time: 09/05/21 12:00 AM  Result Value Ref Range   HM Diabetic Eye Exam No Retinopathy No Retinopathy  I-STAT creatinine     Status: None   Collection Time: 09/26/21  2:23 PM  Result Value Ref Range   Creatinine, Ser 1.00 0.44 - 1.00 mg/dL  Cytology - PAP( [place])     Status: None   Collection Time: 10/25/21  2:42 PM  Result Value Ref Range   High risk HPV Negative    Adequacy Satisfactory for evaluation.    Diagnosis      - Negative for intraepithelial lesion or malignancy (NILM)   Comment Normal Reference Range HPV - Negative   POCT glycosylated hemoglobin (Hb A1C)     Status: Abnormal   Collection Time: 10/25/21  3:17 PM  Result Value Ref Range   Hemoglobin A1C 6.3 (A) 4.0 - 5.6 %   HbA1c POC (<> result, manual entry)     HbA1c, POC  (prediabetic range)     HbA1c, POC (controlled diabetic range)    CBC     Status: None   Collection Time: 11/08/21  6:57 AM  Result Value Ref Range   WBC 9.4 4.0 - 10.5 K/uL   RBC 4.33 3.87 - 5.11 MIL/uL   Hemoglobin 12.6 12.0 - 15.0 g/dL   HCT 38.6 36.0 - 46.0 %   MCV 89.1 80.0 - 100.0 fL   MCH 29.1 26.0 - 34.0 pg   MCHC 32.6 30.0 - 36.0 g/dL   RDW 13.7 11.5 - 15.5 %   Platelets 383 150 - 400 K/uL   nRBC 0.0 0.0 - 0.2 %    Comment: Performed at [facility], [place] [address], [place], [place] [address]  Protime-INR     Status: None   Collection Time: 11/08/21  6:57 AM  Result Value Ref Range   Prothrombin Time 12.8 11.4 - 15.2 seconds   INR 1.0 0.8 - 1.2    Comment: (NOTE) INR goal varies based on device and disease states. Performed at [facility], [place] [address], [place], [place] [address]   Basic metabolic panel     Status: Abnormal   Collection Time: 11/08/21  6:57 AM  Result Value Ref Range   Sodium 135 135 - 145 mmol/L   Potassium 3.6 3.5 - 5.1 mmol/L   Chloride 105 98 - 111 mmol/L   CO2 23 22 - 32 mmol/L   Glucose, Bld 112 (H) 70 - 99 mg/dL    Comment: Glucose reference range applies only to samples taken after fasting for at least  8 hours.   BUN 20 6 - 20 mg/dL   Creatinine, Ser 0.84 0.44 - 1.00 mg/dL   Calcium 8.5 (L) 8.9 - 10.3 mg/dL   GFR, Estimated >60 >60 mL/min    Comment: (NOTE) Calculated using the CKD-EPI Creatinine Equation (2021)    Anion gap 7 5 - 15    Comment: Performed at [facility] [address] [place], [place] [address]  Glucose, capillary     Status: Abnormal   Collection Time: 11/08/21  7:00 AM  Result Value Ref Range   Glucose-Capillary 124 (H) 70 - 99 mg/dL    Comment: Glucose reference range applies only to samples taken after fasting for at least 8 hours.  Glucose, capillary     Status: Abnormal   Collection Time: 11/08/21 11:20 AM  Result Value Ref Range   Glucose-Capillary 126 (H) 70 - 99 mg/dL    Comment:  Glucose reference range applies only to samples taken after fasting for at least 8 hours.  Glucose, capillary     Status: Abnormal   Collection Time: 11/08/21  1:29 PM  Result Value Ref Range   Glucose-Capillary 145 (H) 70 - 99 mg/dL    Comment: Glucose reference range applies only to samples taken after fasting for at least 8 hours.   Comment 1 Notify RN    Comment 2 Document in Chart   Platelet inhibition p2y12 (not at [facility])     Status: None   Collection Time: 11/18/21  1:00 PM  Result Value Ref Range   Platelet Function  P2Y12 See Scanned report in [place] 182 - 335 PRU    Comment: Performed at [facility] (NOTE) The literature has shown a direct correlation of PRU values over 230 with higher risks of thrombotic events. Lower PRU values are associated with platelet inhibition. Performed at [facility], [place] [address] [place], [place] [address]   Glucose, capillary     Status: Abnormal   Collection Time: 11/25/21  6:30 AM  Result Value Ref Range   Glucose-Capillary 123 (H) 70 - 99 mg/dL    Comment: Glucose reference range applies only to samples taken after fasting for at least 8 hours.  Protime-INR     Status: None   Collection Time: 11/25/21  7:08 AM  Result Value Ref Range   Prothrombin Time 12.3 11.4 - 15.2 seconds   INR 0.9 0.8 - 1.2    Comment: (NOTE) INR goal varies based on device and disease states. Performed at [facility], [place] [address] [place], [place] [address]   Basic metabolic panel     Status: Abnormal   Collection Time: 11/25/21  7:08 AM  Result Value Ref Range   Sodium 137 135 - 145 mmol/L   Potassium 3.0 (L) 3.5 - 5.1 mmol/L   Chloride 107 98 - 111 mmol/L   CO2 21 (L) 22 - 32 mmol/L   Glucose, Bld 134 (H) 70 - 99 mg/dL    Comment: Glucose reference range applies only to samples taken after fasting for at least 8 hours.   BUN 13 6 - 20 mg/dL   Creatinine, Ser 0.79 0.44 - 1.00 mg/dL   Calcium 8.9 8.9 - 10.3  mg/dL   GFR, Estimated >60 >60 mL/min    Comment: (NOTE) Calculated using the CKD-EPI Creatinine Equation (2021)    Anion gap 9 5 - 15    Comment: Performed at [facility]  [address], [place], [place] [address]  CBC with Differential/Platelet     Status: Abnormal   Collection Time: 11/25/21  7:08 AM  Result Value Ref Range   WBC 12.4 (H) 4.0 - 10.5 K/uL   RBC 4.00 3.87 - 5.11 MIL/uL   Hemoglobin 12.0 12.0 - 15.0 g/dL   HCT 35.6 (L) 36.0 - 46.0 %   MCV 89.0 80.0 - 100.0 fL   MCH 30.0 26.0 - 34.0 pg   MCHC 33.7 30.0 - 36.0 g/dL   RDW 14.5 11.5 - 15.5 %   Platelets 420 (H) 150 - 400 K/uL   nRBC 0.0 0.0 - 0.2 %   Neutrophils Relative % 61 %   Neutro Abs 7.7 1.7 - 7.7 K/uL   Lymphocytes Relative 27 %   Lymphs Abs 3.3 0.7 - 4.0 K/uL   Monocytes Relative 10 %   Monocytes Absolute 1.3 (H) 0.1 - 1.0 K/uL   Eosinophils Relative 1 %   Eosinophils Absolute 0.1 0.0 - 0.5 K/uL   Basophils Relative 0 %   Basophils Absolute 0.0 0.0 - 0.1 K/uL   Immature Granulocytes 1 %   Abs Immature Granulocytes 0.07 0.00 - 0.07 K/uL    Comment: Performed at [facility], [place] [address], [place], [place] [address]   Objective  Body mass index is 33.16 kg/m. Wt Readings from Last 3 Encounters:  11/28/21 187 lb 3.2 oz (84.9 kg)  11/25/21 188 lb (85.3 kg)  11/08/21 187 lb (84.8 kg)   Temp Readings from Last 3 Encounters:  11/28/21 98.5 F (36.9 C) (Oral)  11/25/21 97.8 F (36.6 C) (Oral)  11/08/21 97.8 F (36.6 C)   BP Readings from Last 3 Encounters:  11/28/21 120/80  11/25/21 (!) 154/88  11/08/21 (!) 154/92   Pulse Readings from Last 3 Encounters:  11/28/21 87  11/25/21 75  11/08/21 98    Physical Exam Vitals and nursing note reviewed.  Constitutional:      Appearance: Normal appearance. She is well-developed and well-groomed.  HENT:     Head: Normocephalic and atraumatic.  Eyes:     Conjunctiva/sclera: Conjunctivae normal.     Pupils: Pupils are equal, round,  and reactive to light.  Cardiovascular:     Rate and Rhythm: Normal rate and regular rhythm.     Heart sounds: Normal heart sounds. No murmur heard. Pulmonary:     Effort: Pulmonary effort is normal.     Breath sounds: Normal breath sounds.  Abdominal:     General: Abdomen is flat. Bowel sounds are normal.     Tenderness: There is no abdominal tenderness.  Musculoskeletal:        General: No tenderness.  Skin:    General: Skin is warm and dry.  Neurological:     General: No focal deficit present.     Mental Status: She is alert and oriented to person, place, and time. Mental status is at baseline.     Cranial Nerves: Cranial nerves 2-12 are intact.     Sensory: Sensation is intact.     Motor: Motor function is intact.     Coordination: Coordination is intact.     Gait: Gait is intact.  Psychiatric:        Attention and Perception: Attention and perception normal.        Mood and Affect: Mood and affect normal.        Speech: Speech normal.        Behavior: Behavior normal. Behavior is cooperative.  Thought Content: Thought content normal.        Cognition and Memory: Cognition and memory normal.        Judgment: Judgment normal.    Assessment  Plan  Chronic bronchitis/copd/asthma with exacerbation - Plan: ipratropium-albuterol (DUONEB) 0.5-2.5 (3) MG/3ML SOLN, DG Chest 2 View albuterol (PROAIR HFA) 108 (90 Base) MCG/ACT inhaler, azithromycin (ZITHROMAX) 250 MG tablet, methylPREDNISolone (MEDROL DOSEPAK) 4 MG TBPK tablet,  Chest 2 View Continue breztri as directed   Provider: Dr. Olivia Mackie McLean-Scocuzza-Internal Medicine

## 2021-11-29 ENCOUNTER — Telehealth (HOSPITAL_COMMUNITY): Payer: Self-pay | Admitting: Radiology

## 2021-11-29 ENCOUNTER — Encounter (HOSPITAL_COMMUNITY): Payer: Self-pay | Admitting: Vascular Surgery

## 2021-11-29 ENCOUNTER — Other Ambulatory Visit: Payer: Self-pay | Admitting: Radiology

## 2021-11-29 ENCOUNTER — Telehealth: Payer: Self-pay | Admitting: Student

## 2021-11-29 DIAGNOSIS — I671 Cerebral aneurysm, nonruptured: Secondary | ICD-10-CM

## 2021-11-29 NOTE — Anesthesia Preprocedure Evaluation (Deleted)
Anesthesia Evaluation    Airway        Dental   Pulmonary sleep apnea , Patient abstained from smoking., former smoker,           Cardiovascular hypertension, + angina + CAD and + Peripheral Vascular Disease       Neuro/Psych  Headaches, PSYCHIATRIC DISORDERS Anxiety Depression Bipolar Disorder    GI/Hepatic GERD  ,(+)     substance abuse  cocaine use and marijuana use,   Endo/Other  diabetes, Poorly Controlled, Type 2  Renal/GU Renal disease     Musculoskeletal   Abdominal   Peds  Hematology   Anesthesia Other Findings   Reproductive/Obstetrics                            Anesthesia Physical Anesthesia Plan  ASA: 4  Anesthesia Plan: General   Post-op Pain Management:    Induction: Intravenous  PONV Risk Score and Plan: 2 and Treatment may vary due to age or medical condition  Airway Management Planned: Oral ETT and Video Laryngoscope Planned  Additional Equipment:   Intra-op Plan:   Post-operative Plan: Extubation in OR  Informed Consent:   Plan Discussed with: Anesthesiologist and CRNA  Anesthesia Plan Comments: (I have taken care of patient several times. She was cancelled in preop last time by Dr. Estanislado Pandy due to recent steroid use for bronchitis. Patient is intermittently compliant with medications and often confused about the indications for her meds. Teeth are loose.  )       Anesthesia Quick Evaluation

## 2021-11-29 NOTE — Progress Notes (Signed)
Anesthesia follow-up:   Case: [id number] Date/Time: 12/02/21 0815   Procedure: EMBOLIZATION   Anesthesia type: General   Pre-op diagnosis: BRAIN ANEURYSM   Location: [facility]   Surgeons: Luanne Bras, MD       DISCUSSION: See 11/07/21 anesthesia APP note by Karoline Caldwell, PA-C.  He noted: "Follows cardiology for history of CAD s/p PCI to left circumflex with chronic stable angina, HTN, HLD, OSA (not using CPAP).  Last seen by Dr. Saunders Revel 08/21/2021.  Noted that her chest pain symptoms had improved with escalation of isosorbide mononitrate 90 mg daily.  ABIs were ordered due to patient report of leg cramping (test done 09/26/2021 showed evidence of some narrowing/blockage in small arteries in her calf/feet, however large arteries had no significant disease).  She was recommended follow-up in 6 months.   Former smoker.  30-pack-year history.  Quit 12/31/2020.  She has COPD and is maintained on Breztri.   Follows with hematology for history of waxing and waning leukocytosis and thrombocytosis.  Last seen 08/27/2021 WBC was normal and platelets mildly elevated at 475.  Myeloproliferative work-up in the past has been otherwise unremarkable.  Recommended yearly follow-up."   Since 11/07/21 note, she underwent 4V cerebral arteriogram on 11/08/21 showing: Findings. 1.Approximately 4.5 mm x 3 mm right P-comm artery aneurysm. 2.  Approximately 2.5 x 2.5 mm wide necked lobulated right MCA trifurcation aneurysm. 3.  Severe bilateral vertebral artery origin stenosis.   She is now scheduled for cerebral arteriogram with possible right posterior communicating artery and right middle cerebral artery aneurysm embolization by Dr. Estanislado Pandy on 12/02/21. She initially came in on 11/25/21, but procedure was cancelled because she had just a few days prior been treated with a prednisone Dosepak for SOB/wheezing (negative COVID test). She was referred back to her PCP McLean-Scocuzza, Nino Glow, MD who evaluated  her on 11/28/21 for 12/02/21 procedure. She noted that patient was not wheezing, but continued treatment with Duoneb, albuterol, Zithromax, and Medrol Dosepak 4 mg. Continue Breztri. CXR showed no acute process.   I received communication from [place] in [employer]. She has been in communication with both Dr. Terese Door and Dr. Estanislado Pandy. ". He wants her to come in Monday as planned and he said he will do a chest examination and check her breathing etc and if she was not wheezing he would proceed but if she was he would cancel again." I have updated anesthesiologist Hoy Morn, MD. Anesthesia team to evaluate on the day of surgery.    She is a same-day work-up, so anesthesia team to evaluate on the day of surgery. Labs on arrival as indicated. She remains on ASA and Plavix as prescribed.    VS:  BP Readings from Last 3 Encounters:  11/28/21 120/80  11/25/21 (!) 154/88  11/08/21 (!) 154/92   Pulse Readings from Last 3 Encounters:  11/28/21 87  11/25/21 75  11/08/21 98     PROVIDERS: McLean-Scocuzza, Nino Glow, MD is PCP  End, Harrell Gave, MD is cardiologist  LABS: Latest results include: Lab Results  Component Value Date   WBC 12.4 (H) 11/25/2021   HGB 12.0 11/25/2021   HCT 35.6 (L) 11/25/2021   PLT 420 (H) 11/25/2021   GLUCOSE 134 (H) 11/25/2021   ALT 58 (H) 08/21/2021   AST 41 (H) 08/21/2021   NA 137 11/25/2021   K 3.0 (L) 11/25/2021   CL 107 11/25/2021   CREATININE 0.79 11/25/2021   BUN 13 11/25/2021   CO2 21 (L)  11/25/2021   TSH 3.052 02/19/2021   INR 0.9 11/25/2021   HGBA1C 6.3 (A) 10/25/2021     CXR 11/28/21: FINDINGS: The heart size and mediastinal contours are within normal limits. Both lungs are clear. The visualized skeletal structures are unremarkable. IMPRESSION: No active cardiopulmonary disease.   EKG 04/30/21: NSR. Rate 71. Nonspecific t wave abnormality.     Cath 09/11/2020: Conclusions: Moderately severe multivessel coronary artery disease  including mild plaquing of the proximal and distal LAD, 40% in-stent restenosis of ostial LCx, 30% distal LCx lesion, and focal 60% mid RCA stenosis. Hyperdynamic left ventricular contraction with moderately elevated filling pressure. Significant difficulty attaining adequate sedation during procedure.  The patient would fluctuate between marked agitation and sleep every few minutes after having received 3 mg of midazolam and 75 mcg of fentanyl.   Recommendations: Continue current antianginal therapy with escalation as tolerated if recurrent angina occurs. Add furosemide 20 mg daily for gentle diuresis in the setting of elevated LVEDP consistent with diastolic dysfunction.  This may be contributing to the patient's symptoms. If the patient has refractory angina, functional study to assess hemodynamic significance of LCx/RCA disease is recommended.  If catheterization/PCI is needed in the future, involvement of anesthesia will need to be considered.   TTE 03/16/2019:  1. The left ventricle has normal systolic function with an ejection  fraction of 60-65%. The cavity size was normal. Left ventricular diastolic  Doppler parameters are consistent with impaired relaxation.   2. The right ventricle has normal systolic function. The cavity was  normal. There is no increase in right ventricular wall thickness.Unable to  estimate RVSP.     Past Medical History:  Diagnosis Date   Anxiety    Asthma    Bipolar 1 disorder ([place])    Bipolar disorder ([place])    CAD (coronary artery disease)    s/p stent BMS OM Cx   Cervical herniated disc 04/12/2016   COPD (chronic obstructive pulmonary disease) (HCC)    Depression    Diabetes mellitus with gastroparesis (HCC)    Diabetes mellitus without complication (HCC)    no meds, diet controlled   Diverticulitis    GERD (gastroesophageal reflux disease)    Glaucoma    History of blood transfusion    Hyperlipidemia    Hypertension    OSA (obstructive sleep  apnea)    not using cpap    Plantar fasciitis    b/l feet s/p steroid shots w/o help and left surgery w/o help    Spinal headache    UTI (urinary tract infection)     Past Surgical History:  Procedure Laterality Date   ABDOMINAL HYSTERECTOMY     ABDOMINAL SURGERY  1995   Bowel resection.   CARDIAC CATHETERIZATION N/A 04/14/2016   Procedure: Left Heart Cath and Coronary Angiography;  Surgeon: Burnell Blanks, MD;  Location: [facility];  Service: Cardiovascular;  Laterality: N/A;   CARDIAC SURGERY     COLONOSCOPY WITH PROPOFOL N/A 07/11/2019   Procedure: COLONOSCOPY WITH PROPOFOL;  Surgeon: Jonathon Bellows, MD;  Location: [facility];  Service: Gastroenterology;  Laterality: N/A;   COLONOSCOPY WITH PROPOFOL N/A 07/29/2019   Procedure: COLONOSCOPY WITH PROPOFOL;  Surgeon: Jonathon Bellows, MD;  Location: [facility];  Service: Gastroenterology;  Laterality: N/A;   CORONARY ANGIOPLASTY WITH STENT PLACEMENT  2010   Drug eluting stent   ESOPHAGOGASTRODUODENOSCOPY (EGD) WITH PROPOFOL N/A 07/11/2019   Procedure: ESOPHAGOGASTRODUODENOSCOPY (EGD) WITH PROPOFOL;  Surgeon: Jonathon Bellows, MD;  Location: [facility];  Service: Gastroenterology;  Laterality: N/A;   IR 3D INDEPENDENT WKST  11/08/2021   IR ANGIO INTRA EXTRACRAN SEL COM CAROTID INNOMINATE BILAT MOD SED  11/08/2021   IR ANGIO VERTEBRAL SEL VERTEBRAL BILAT MOD SED  11/08/2021   IR RADIOLOGIST EVAL & MGMT  10/16/2021   IR US GUIDE VASC ACCESS RIGHT  11/08/2021   LEFT HEART CATH AND CORONARY ANGIOGRAPHY Left 09/11/2020   Procedure: LEFT HEART CATH AND CORONARY ANGIOGRAPHY;  Surgeon: Nelva Bush, MD;  Location: [facility];  Service: Cardiovascular;  Laterality: Left;   OVARIAN CYST REMOVAL     RADIOLOGY WITH ANESTHESIA N/A 11/08/2021   Procedure: Cyril Loosen;  Surgeon: Luanne Bras, MD;  Location: [facility];  Service: Radiology;  Laterality: N/A;    MEDICATIONS: No current facility-administered medications for this  encounter.    albuterol (PROAIR HFA) 108 (90 Base) MCG/ACT inhaler   amLODipine (NORVASC) 10 MG tablet   aspirin 81 MG chewable tablet   atorvastatin (LIPITOR) 80 MG tablet   azithromycin (ZITHROMAX) 250 MG tablet   baclofen (LIORESAL) 10 MG tablet   Budeson-Glycopyrrol-Formoterol (BREZTRI AEROSPHERE) 160-9-4.8 MCG/ACT AERO   clopidogrel (PLAVIX) 75 MG tablet   dicyclomine (BENTYL) 20 MG tablet   divalproex (DEPAKOTE) 500 MG DR tablet   ezetimibe (ZETIA) 10 MG tablet   ipratropium (ATROVENT) 0.06 % nasal spray   ipratropium-albuterol (DUONEB) 0.5-2.5 (3) MG/3ML SOLN   isosorbide mononitrate (IMDUR) 30 MG 24 hr tablet   levocetirizine (XYZAL) 5 MG tablet   methocarbamol (ROBAXIN) 500 MG tablet   methylPREDNISolone (MEDROL DOSEPAK) 4 MG TBPK tablet   metoCLOPramide (REGLAN) 5 MG tablet   mometasone (ELOCON) 0.1 % cream   nitroGLYCERIN (NITROSTAT) 0.4 MG SL tablet   olmesartan-hydrochlorothiazide (BENICAR HCT) 20-12.5 MG tablet   ondansetron (ZOFRAN-ODT) 4 MG disintegrating tablet   pantoprazole (PROTONIX) 40 MG tablet   predniSONE (DELTASONE) 20 MG tablet   promethazine (PHENERGAN) 25 MG tablet   REPATHA SURECLICK 458 MG/ML SOAJ   topiramate (TOPAMAX) 25 MG tablet    Myra Gianotti, PA-C Surgical Short Stay/Anesthesiology [facility] Phone [phone] [facility] Phone [phone] 11/29/2021 12:40 PM

## 2021-11-29 NOTE — Telephone Encounter (Signed)
Called pt to let her know that after speaking with her PCP, Deveshwar, and Ebony Hail, [place] w/ anesthesia that Dr. Estanislado Pandy would like for her to come in Monday, 12/02/21 as planned. He will examine the patient to determine if she is well enough to proceed with her brain aneurysm embolization. The patient understands that the procedure could be postponed again if her breathing/wheezing has not improved. She is in agreement with this plan of care. JM

## 2021-11-29 NOTE — Telephone Encounter (Signed)
Interventional Radiology Brief Note:  Patient was referred to her PCP after presenting to short stay last week with shortness of breath, wheezing.  She has completed a visit and is currently on medication with improvement.  Per Dr. Estanislado Pandy, patient should present as scheduled Monday with anticipation of proceeding pending evaluation by IR and anesthesia on that day.  Brynda Greathouse, MS RD PA-C

## 2021-12-02 ENCOUNTER — Inpatient Hospital Stay (HOSPITAL_COMMUNITY): Admission: RE | Admit: 2021-12-02 | Payer: Medicare Other | Source: Ambulatory Visit

## 2021-12-02 ENCOUNTER — Inpatient Hospital Stay (HOSPITAL_COMMUNITY)
Admission: RE | Admit: 2021-12-02 | Payer: Medicare Other | Source: Home / Self Care | Admitting: Interventional Radiology

## 2021-12-02 ENCOUNTER — Telehealth (HOSPITAL_COMMUNITY): Payer: Self-pay

## 2021-12-02 SURGERY — IR WITH ANESTHESIA
Anesthesia: General

## 2021-12-02 NOTE — Telephone Encounter (Signed)
Called pt to see how she was feeling. She spoke to Dr. Estanislado Pandy this morning and canceled her procedure bc she is still sick. She goes to see her physician on Friday. She will call to reschedule when she is better. AW

## 2021-12-06 ENCOUNTER — Encounter: Payer: Self-pay | Admitting: Internal Medicine

## 2021-12-06 ENCOUNTER — Ambulatory Visit (INDEPENDENT_AMBULATORY_CARE_PROVIDER_SITE_OTHER): Payer: Medicare Other | Admitting: Internal Medicine

## 2021-12-06 VITALS — BP 140/80 | HR 72 | Temp 98.2°F | Resp 14 | Ht 63.0 in | Wt 190.4 lb

## 2021-12-06 DIAGNOSIS — J392 Other diseases of pharynx: Secondary | ICD-10-CM | POA: Diagnosis not present

## 2021-12-06 DIAGNOSIS — J42 Unspecified chronic bronchitis: Secondary | ICD-10-CM | POA: Diagnosis not present

## 2021-12-06 DIAGNOSIS — E876 Hypokalemia: Secondary | ICD-10-CM | POA: Diagnosis not present

## 2021-12-06 DIAGNOSIS — H9193 Unspecified hearing loss, bilateral: Secondary | ICD-10-CM

## 2021-12-06 DIAGNOSIS — H6123 Impacted cerumen, bilateral: Secondary | ICD-10-CM

## 2021-12-06 MED ORDER — HYDROCODONE BIT-HOMATROP MBR 5-1.5 MG/5ML PO SOLN: 5.0000 mL | Freq: Every evening | ORAL | 0 refills | Status: DC | PRN

## 2021-12-06 MED ORDER — POTASSIUM CHLORIDE ER 10 MEQ PO CPCR: 20.0000 meq | ORAL_CAPSULE | Freq: Every day | ORAL | 3 refills | Status: DC

## 2021-12-06 MED ORDER — NAC 600 MG PO CAPS: 1.0000 | ORAL_CAPSULE | Freq: Two times a day (BID) | ORAL | 3 refills | Status: DC

## 2021-12-06 NOTE — Patient Instructions (Addendum)
Dr. Michell Heinrich  [facility]  [phone]  Family medicine in [place]    Referred to [facility] in [place] for hearing test

## 2021-12-06 NOTE — Progress Notes (Signed)
Chief Complaint  Patient presents with   Follow-up    Pt still not feeling feeling better since last OV on 11/28/21, still coughing,throat dry has been taking mucinex as recommended w/o relief. Still wheezing but not as much and still having sulfa burps   F/u  1.bronchitis still coughing a lot and throat dry but wheezing improved and having mucous thick tried mucinex w/o relief 2. Sulfa burps and flare of hemorrhoids with rectal bleeding f/u GI appt 01/2022 may need to be moved up  3. Low K Rx K   Review of Systems  Constitutional:  Negative for weight loss.  HENT:  Negative for hearing loss.   Eyes:  Negative for blurred vision.  Respiratory:  Negative for shortness of breath.   Cardiovascular:  Negative for chest pain.  Gastrointestinal:  Negative for abdominal pain and blood in stool.  Genitourinary:  Negative for dysuria.  Musculoskeletal:  Negative for falls and joint pain.  Skin:  Negative for rash.  Neurological:  Negative for headaches.  Psychiatric/Behavioral:  Negative for depression.   Past Medical History:  Diagnosis Date   Anxiety    Asthma    Bipolar 1 disorder ([place])    Bipolar disorder ([place])    CAD (coronary artery disease)    s/p stent BMS OM Cx   Cervical herniated disc 04/12/2016   COPD (chronic obstructive pulmonary disease) (HCC)    Depression    Diabetes mellitus with gastroparesis (HCC)    Diabetes mellitus without complication (HCC)    no meds, diet controlled   Diverticulitis    GERD (gastroesophageal reflux disease)    Glaucoma    History of blood transfusion    Hyperlipidemia    Hypertension    OSA (obstructive sleep apnea)    not using cpap    Plantar fasciitis    b/l feet s/p steroid shots w/o help and left surgery w/o help    Spinal headache    UTI (urinary tract infection)    Past Surgical History:  Procedure Laterality Date   ABDOMINAL HYSTERECTOMY     ABDOMINAL SURGERY  1995   Bowel resection.   CARDIAC CATHETERIZATION N/A 04/14/2016    Procedure: Left Heart Cath and Coronary Angiography;  Surgeon: Burnell Blanks, MD;  Location: [place] [facility];  Service: Cardiovascular;  Laterality: N/A;   CARDIAC SURGERY     COLONOSCOPY WITH PROPOFOL N/A 07/11/2019   Procedure: COLONOSCOPY WITH PROPOFOL;  Surgeon: Jonathon Bellows, MD;  Location: [facility] ENDOSCOPY;  Service: Gastroenterology;  Laterality: N/A;   COLONOSCOPY WITH PROPOFOL N/A 07/29/2019   Procedure: COLONOSCOPY WITH PROPOFOL;  Surgeon: Jonathon Bellows, MD;  Location: [facility] ENDOSCOPY;  Service: Gastroenterology;  Laterality: N/A;   CORONARY ANGIOPLASTY WITH STENT PLACEMENT  2010   Drug eluting stent   ESOPHAGOGASTRODUODENOSCOPY (EGD) WITH PROPOFOL N/A 07/11/2019   Procedure: ESOPHAGOGASTRODUODENOSCOPY (EGD) WITH PROPOFOL;  Surgeon: Jonathon Bellows, MD;  Location: [facility] ENDOSCOPY;  Service: Gastroenterology;  Laterality: N/A;   IR 3D INDEPENDENT WKST  11/08/2021   IR ANGIO INTRA EXTRACRAN SEL COM CAROTID INNOMINATE BILAT MOD SED  11/08/2021   IR ANGIO VERTEBRAL SEL VERTEBRAL BILAT MOD SED  11/08/2021   IR RADIOLOGIST EVAL & MGMT  10/16/2021   IR US GUIDE VASC ACCESS RIGHT  11/08/2021   LEFT HEART CATH AND CORONARY ANGIOGRAPHY Left 09/11/2020   Procedure: LEFT HEART CATH AND CORONARY ANGIOGRAPHY;  Surgeon: Nelva Bush, MD;  Location: [place] [facility];  Service: Cardiovascular;  Laterality: Left;   OVARIAN CYST REMOVAL  RADIOLOGY WITH ANESTHESIA N/A 11/08/2021   Procedure: Cyril Loosen;  Surgeon: Luanne Bras, MD;  Location: [place];  Service: Radiology;  Laterality: N/A;   Family History  Problem Relation Age of Onset   CAD Mother    Depression Mother    Heart disease Mother    Hyperlipidemia Mother    Hypertension Mother    Heart disease Father    Alcohol abuse Father    Cancer Brother 57       brain   CAD Brother    Depression Brother    Diabetes Brother    Heart disease Brother    Hyperlipidemia Brother    Lupus Other    Sickle cell anemia Other    Social  History   Socioeconomic History   Marital status: Single    Spouse name: Not on file   Number of children: 1   Years of education: Not on file   Highest education level: Not on file  Occupational History   Not on file  Tobacco Use   Smoking status: Former    Packs/day: 1.00    Years: 30.00    Pack years: 30.00    Types: Cigarettes    Quit date: 12/31/2020    Years since quitting: 0.9   Smokeless tobacco: Never   Tobacco comments:    Quit after being admitted on 6/20 01/17/2021  Vaping Use   Vaping Use: Former  Substance and Sexual Activity   Alcohol use: Yes    Comment: once a year   Drug use: Yes    Types: Marijuana    Comment: marijuana   Sexual activity: Not Currently  Other Topics Concern   Not on file  Social History Narrative   From [place] now living in [place]    1 son    No guns    Wears seat belt   Safe in relationship    Social Determinants of Health   Financial Resource Strain: Low Risk    Difficulty of Paying Living Expenses: Not very hard  Food Insecurity: No Food Insecurity   Worried About Charity fundraiser in the Last Year: Never true   Arboriculturist in the Last Year: Never true  Transportation Needs: No Transportation Needs   Lack of Transportation (Medical): No   Lack of Transportation (Non-Medical): No  Physical Activity: Not on file  Stress: No Stress Concern Present   Feeling of Stress : Not at all  Social Connections: Unknown   Frequency of Communication with Friends and Family: More than three times a week   Frequency of Social Gatherings with Friends and Family: More than three times a week   Attends Religious Services: Not on Electrical engineer or Organizations: Not on file   Attends Archivist Meetings: Not on file   Marital Status: Not on file  Intimate Partner Violence: Not At Risk   Fear of Current or Ex-Partner: No   Emotionally Abused: No   Physically Abused: No   Sexually Abused: No    Current Meds  Medication Sig   Acetylcysteine (NAC) 600 MG CAPS Take 1 capsule (600 mg total) by mouth 2 (two) times daily. prn   albuterol (PROAIR HFA) 108 (90 Base) MCG/ACT inhaler Inhale 1-2 puffs into the lungs every 4 (four) hours as needed for wheezing or shortness of breath.   amLODipine (NORVASC) 10 MG tablet TAKE 1 TABLET BY MOUTH AT  BEDTIME   aspirin 81  MG chewable tablet Chew 81 mg by mouth daily.   atorvastatin (LIPITOR) 80 MG tablet TAKE 1 TABLET BY MOUTH  DAILY AT 6 PM.   baclofen (LIORESAL) 10 MG tablet Take 10 mg by mouth 2 (two) times daily.   Budeson-Glycopyrrol-Formoterol (BREZTRI AEROSPHERE) 160-9-4.8 MCG/ACT AERO Inhale 2 puffs into the lungs in the morning and at bedtime. Rinse   clopidogrel (PLAVIX) 75 MG tablet TAKE 1 TABLET BY MOUTH  DAILY   dicyclomine (BENTYL) 20 MG tablet Take 1 tablet (20 mg total) by mouth 4 (four) times daily -  before meals and at bedtime.   divalproex (DEPAKOTE) 500 MG DR tablet TAKE 1 TABLET BY MOUTH  TWICE DAILY   ezetimibe (ZETIA) 10 MG tablet TAKE 1 TABLET BY MOUTH  DAILY WITH LIPITOR 80 MG AT NIGHT   HYDROcodone bit-homatropine (HYCODAN) 5-1.5 MG/5ML syrup Take 5 mLs by mouth at bedtime as needed for cough.   ipratropium (ATROVENT) 0.06 % nasal spray USE 2 SPRAYS IN BOTH  NOSTRILS 3 TIMES DAILY   ipratropium-albuterol (DUONEB) 0.5-2.5 (3) MG/3ML SOLN Take 3 mLs by nebulization every 6 (six) hours as needed.   isosorbide mononitrate (IMDUR) 30 MG 24 hr tablet Take 3 tablets (90 mg) by mouth once daily   levocetirizine (XYZAL) 5 MG tablet    methocarbamol (ROBAXIN) 500 MG tablet Take 1 tablet (500 mg total) by mouth 2 (two) times daily as needed for muscle spasms.   methylPREDNISolone (MEDROL DOSEPAK) 4 MG TBPK tablet Use as directed with food   metoCLOPramide (REGLAN) 5 MG tablet TAKE 1 TABLET BY MOUTH 4  TIMES DAILY BEFORE MEALS  AND AT BEDTIME   mometasone (ELOCON) 0.1 % cream Apply 1 application topically as directed. Qhs 5 days a  week Monday through Friday (Patient taking differently: Apply 1 application. topically daily. Qhs 5 days a week Monday through Friday)   nitroGLYCERIN (NITROSTAT) 0.4 MG SL tablet DISSOLVE 1 TABLET UNDER  TONGUE EVERY 5 MINUTES AS  NEEDED FOR CHEST PAIN MAX  OF 3 TABLETS IN 15 MINUTES  . CALL 911 AFTER THIRD DOSE   olmesartan-hydrochlorothiazide (BENICAR HCT) 20-12.5 MG tablet Take 1 tablet by mouth daily. In am if BP >130/>80 take 2 pills to equal 40-25 mg qd   ondansetron (ZOFRAN-ODT) 4 MG disintegrating tablet DISSOLVE 1 TABLET ON TOP OF THE  TONGUE EVERY 8 HOURS AS NEEDED  FOR NAUEA OR VOMITING   pantoprazole (PROTONIX) 40 MG tablet TAKE 1 TABLET BY MOUTH  TWICE DAILY BEFORE MEALS   potassium chloride (MICRO-K) 10 MEQ CR capsule Take 2 capsules (20 mEq total) by mouth daily.   promethazine (PHENERGAN) 25 MG tablet TAKE 1 TABLET BY MOUTH TWICE  DAILY AS NEEDED FOR NAUSEA OR  VOMITING   REPATHA SURECLICK 097 MG/ML SOAJ INJECT '140MG'$  SUBCUTANEOUSLY  EVERY 2 WEEKS   Allergies  Allergen Reactions   Ativan [Lorazepam]     Pt states it makes her tongue do weird things    Augmentin [Amoxicillin-Pot Clavulanate]     Upset stomach    Lactose Intolerance (Gi)    Latex Other (See Comments)    Patient stated that she was told by her doctor that she is "allergic to" this   Tape Other (See Comments)    Patient stated that she was told by her doctor that she is "allergic to" this   Xifaxan [Rifaximin]     Pt believes this is contributing to her abdominal pain/discomfort   Drixoral Allergy Sinus [Dexbromphen-Pse-Apap Er] Rash  Recent Results (from the past 2160 hour(s))  I-STAT creatinine     Status: None   Collection Time: 09/26/21  2:23 PM  Result Value Ref Range   Creatinine, Ser 1.00 0.44 - 1.00 mg/dL  Cytology - PAP( [place])     Status: None   Collection Time: 10/25/21  2:42 PM  Result Value Ref Range   High risk HPV Negative    Adequacy Satisfactory for evaluation.    Diagnosis       - Negative for intraepithelial lesion or malignancy (NILM)   Comment Normal Reference Range HPV - Negative   POCT glycosylated hemoglobin (Hb A1C)     Status: Abnormal   Collection Time: 10/25/21  3:17 PM  Result Value Ref Range   Hemoglobin A1C 6.3 (A) 4.0 - 5.6 %   HbA1c POC (<> result, manual entry)     HbA1c, POC (prediabetic range)     HbA1c, POC (controlled diabetic range)    CBC     Status: None   Collection Time: 11/08/21  6:57 AM  Result Value Ref Range   WBC 9.4 4.0 - 10.5 K/uL   RBC 4.33 3.87 - 5.11 MIL/uL   Hemoglobin 12.6 12.0 - 15.0 g/dL   HCT 38.6 36.0 - 46.0 %   MCV 89.1 80.0 - 100.0 fL   MCH 29.1 26.0 - 34.0 pg   MCHC 32.6 30.0 - 36.0 g/dL   RDW 13.7 11.5 - 15.5 %   Platelets 383 150 - 400 K/uL   nRBC 0.0 0.0 - 0.2 %    Comment: Performed at [facility], [place] [address] [place], [place] [address]  Protime-INR     Status: None   Collection Time: 11/08/21  6:57 AM  Result Value Ref Range   Prothrombin Time 12.8 11.4 - 15.2 seconds   INR 1.0 0.8 - 1.2    Comment: (NOTE) INR goal varies based on device and disease states. Performed at [facility], [place] [address], [place], [place] [address]   Basic metabolic panel     Status: Abnormal   Collection Time: 11/08/21  6:57 AM  Result Value Ref Range   Sodium 135 135 - 145 mmol/L   Potassium 3.6 3.5 - 5.1 mmol/L   Chloride 105 98 - 111 mmol/L   CO2 23 22 - 32 mmol/L   Glucose, Bld 112 (H) 70 - 99 mg/dL    Comment: Glucose reference range applies only to samples taken after fasting for at least 8 hours.   BUN 20 6 - 20 mg/dL   Creatinine, Ser 0.84 0.44 - 1.00 mg/dL   Calcium 8.5 (L) 8.9 - 10.3 mg/dL   GFR, Estimated >60 >60 mL/min    Comment: (NOTE) Calculated using the CKD-EPI Creatinine Equation (2021)    Anion gap 7 5 - 15    Comment: Performed at [place] [address] [place], [place] [address]  Glucose, capillary     Status: Abnormal   Collection Time: 11/08/21  7:00 AM   Result Value Ref Range   Glucose-Capillary 124 (H) 70 - 99 mg/dL    Comment: Glucose reference range applies only to samples taken after fasting for at least 8 hours.  Glucose, capillary     Status: Abnormal   Collection Time: 11/08/21 11:20 AM  Result Value Ref Range   Glucose-Capillary 126 (H) 70 - 99 mg/dL    Comment: Glucose reference range applies only to samples taken after fasting for  at least 8 hours.  Glucose, capillary     Status: Abnormal   Collection Time: 11/08/21  1:29 PM  Result Value Ref Range   Glucose-Capillary 145 (H) 70 - 99 mg/dL    Comment: Glucose reference range applies only to samples taken after fasting for at least 8 hours.   Comment 1 Notify RN    Comment 2 Document in Chart   Platelet inhibition p2y12 (not at [facility])     Status: None   Collection Time: 11/18/21  1:00 PM  Result Value Ref Range   Platelet Function  P2Y12 See Scanned report in [place] 182 - 335 PRU    Comment: Performed at [facility] (NOTE) The literature has shown a direct correlation of PRU values over 230 with higher risks of thrombotic events. Lower PRU values are associated with platelet inhibition. Performed at [facility], [place] [address] [place], [place] [address]   Glucose, capillary     Status: Abnormal   Collection Time: 11/25/21  6:30 AM  Result Value Ref Range   Glucose-Capillary 123 (H) 70 - 99 mg/dL    Comment: Glucose reference range applies only to samples taken after fasting for at least 8 hours.  Protime-INR     Status: None   Collection Time: 11/25/21  7:08 AM  Result Value Ref Range   Prothrombin Time 12.3 11.4 - 15.2 seconds   INR 0.9 0.8 - 1.2    Comment: (NOTE) INR goal varies based on device and disease states. Performed at [facility], [place] [address] [place], [place] [address]   Basic metabolic panel     Status: Abnormal   Collection Time: 11/25/21  7:08 AM  Result Value Ref Range   Sodium 137 135 - 145 mmol/L    Potassium 3.0 (L) 3.5 - 5.1 mmol/L   Chloride 107 98 - 111 mmol/L   CO2 21 (L) 22 - 32 mmol/L   Glucose, Bld 134 (H) 70 - 99 mg/dL    Comment: Glucose reference range applies only to samples taken after fasting for at least 8 hours.   BUN 13 6 - 20 mg/dL   Creatinine, Ser 0.79 0.44 - 1.00 mg/dL   Calcium 8.9 8.9 - 10.3 mg/dL   GFR, Estimated >60 >60 mL/min    Comment: (NOTE) Calculated using the CKD-EPI Creatinine Equation (2021)    Anion gap 9 5 - 15    Comment: Performed at [place] [address] [place], [place] [address]  CBC with Differential/Platelet     Status: Abnormal   Collection Time: 11/25/21  7:08 AM  Result Value Ref Range   WBC 12.4 (H) 4.0 - 10.5 K/uL   RBC 4.00 3.87 - 5.11 MIL/uL   Hemoglobin 12.0 12.0 - 15.0 g/dL   HCT 35.6 (L) 36.0 - 46.0 %   MCV 89.0 80.0 - 100.0 fL   MCH 30.0 26.0 - 34.0 pg   MCHC 33.7 30.0 - 36.0 g/dL   RDW 14.5 11.5 - 15.5 %   Platelets 420 (H) 150 - 400 K/uL   nRBC 0.0 0.0 - 0.2 %   Neutrophils Relative % 61 %   Neutro Abs 7.7 1.7 - 7.7 K/uL   Lymphocytes Relative 27 %   Lymphs Abs 3.3 0.7 - 4.0 K/uL   Monocytes Relative 10 %   Monocytes Absolute 1.3 (H) 0.1 - 1.0 K/uL   Eosinophils Relative 1 %   Eosinophils Absolute 0.1 0.0 -  0.5 K/uL   Basophils Relative 0 %   Basophils Absolute 0.0 0.0 - 0.1 K/uL   Immature Granulocytes 1 %   Abs Immature Granulocytes 0.07 0.00 - 0.07 K/uL    Comment: Performed at [facility], [place] [address] [place], [place] [address]   Objective  Body mass index is 33.73 kg/m. Wt Readings from Last 3 Encounters:  12/06/21 190 lb 6.4 oz (86.4 kg)  11/28/21 187 lb 3.2 oz (84.9 kg)  11/25/21 188 lb (85.3 kg)   Temp Readings from Last 3 Encounters:  12/06/21 98.2 F (36.8 C) (Oral)  11/28/21 98.5 F (36.9 C) (Oral)  11/25/21 97.8 F (36.6 C) (Oral)   BP Readings from Last 3 Encounters:  12/06/21 140/80  11/28/21 120/80  11/25/21 (!) 154/88   Pulse Readings from Last 3  Encounters:  12/06/21 72  11/28/21 87  11/25/21 75    Physical Exam Vitals and nursing note reviewed.  Constitutional:      Appearance: Normal appearance. She is well-developed and well-groomed.  HENT:     Head: Normocephalic and atraumatic.  Eyes:     Conjunctiva/sclera: Conjunctivae normal.     Pupils: Pupils are equal, round, and reactive to light.  Cardiovascular:     Rate and Rhythm: Normal rate and regular rhythm.     Heart sounds: Normal heart sounds. No murmur heard. Pulmonary:     Effort: Pulmonary effort is normal.     Breath sounds: Normal breath sounds.  Abdominal:     General: Abdomen is flat. Bowel sounds are normal.     Tenderness: There is no abdominal tenderness.  Musculoskeletal:        General: No tenderness.  Skin:    General: Skin is warm and dry.  Neurological:     General: No focal deficit present.     Mental Status: She is alert and oriented to person, place, and time. Mental status is at baseline.     Cranial Nerves: Cranial nerves 2-12 are intact.     Motor: Motor function is intact.     Coordination: Coordination is intact.     Gait: Gait is intact.  Psychiatric:        Attention and Perception: Attention and perception normal.        Mood and Affect: Mood and affect normal.        Speech: Speech normal.        Behavior: Behavior normal. Behavior is cooperative.        Thought Content: Thought content normal.        Cognition and Memory: Cognition and memory normal.        Judgment: Judgment normal.    Assessment  Plan  Dry throat Chronic bronchitis, unspecified chronic bronchitis type ([place]) - Plan: HYDROcodone bit-homatropine (HYCODAN) 5-1.5 MG/5ML syrup, Acetylcysteine (NAC) 600 MG CAPS Prn albuterol, breztri, prn duoneb, prn hycodan, xyzal, mucinex, completing steroids and improved wheezing, add NAC 600 mg qd to bid   Hypokalemia K dur 20 mg qd  Given high K food list  Bilateral hearing loss, unspecified hearing loss type - Plan:  Ambulatory referral to ENT Bilateral impacted cerumen - Plan: Ambulatory referral to ENT    HM See last visit  Pt moving to [place] given # of PCP there Dr. Juliann Mule if wants to establish with [facility]  Moving 12/12/21  Provider: Dr. Olivia Mackie McLean-Scocuzza-Internal Medicine

## 2021-12-16 ENCOUNTER — Ambulatory Visit: Payer: Medicare Other | Admitting: Dermatology

## 2021-12-17 ENCOUNTER — Encounter: Payer: Self-pay | Admitting: Internal Medicine

## 2021-12-18 ENCOUNTER — Encounter (HOSPITAL_COMMUNITY): Payer: Self-pay

## 2021-12-19 ENCOUNTER — Other Ambulatory Visit (HOSPITAL_COMMUNITY)
Admission: RE | Admit: 2021-12-19 | Discharge: 2021-12-19 | Disposition: A | Discharge status: Home / Self Care | Payer: Medicare Other | Source: Ambulatory Visit | Attending: Interventional Radiology | Admitting: Interventional Radiology

## 2021-12-19 DIAGNOSIS — D696 Thrombocytopenia, unspecified: Secondary | ICD-10-CM | POA: Insufficient documentation

## 2021-12-19 DIAGNOSIS — I671 Cerebral aneurysm, nonruptured: Secondary | ICD-10-CM | POA: Diagnosis not present

## 2021-12-23 ENCOUNTER — Encounter (HOSPITAL_COMMUNITY): Payer: Self-pay | Admitting: Interventional Radiology

## 2021-12-23 ENCOUNTER — Other Ambulatory Visit: Payer: Self-pay

## 2021-12-23 NOTE — Progress Notes (Signed)
PCP - Terese Door, MD Cardiologist - [facility] [place]  PPM/ICD - denies Device Orders - n/a Rep Notified - n/a  Chest x-ray - 11/28/2021 EKG - 04/30/2021 Stress Test - denies ECHO - 03/16/2019 Cardiac Cath - 09/11/2020  CPAP - not using per patient  Fasting Blood Sugar - patient is not checking CBG at home A1C - 6.3 - 10/25/2021  Patient will not hold Plavix and Aspirin prior surgery  Patient was instructed: As of today, STOP taking any Aspirin (unless otherwise instructed by your surgeon) Aleve, Naproxen, Ibuprofen, Motrin, Advil, Goody's, BC's, all herbal medications, fish oil, and all vitamins.  ERAS Protcol - n/a  COVID TEST- n/a  Anesthesia review: yes  Patient verbally denies any shortness of breath, fever, cough and chest pain during phone call   -------------  SDW INSTRUCTIONS given:  Your procedure is scheduled on Wednesday, June 14th, 2023.  Report to [facility] Main Entrance "A" at 06:00 A.M., and check in at the Admitting office.  Call this number if you have problems the morning of surgery:  [phone]   Remember:  Do not eat or drink after midnight the night before your surgery    Take these medicines the morning of surgery with A SIP OF WATER Aspirin, Plavix, Depakote, Mucinex, Imdur, Protonix PRN: Bentyl, Reglan, Zofran, Nitroglycerin, inhalers  As of today, STOP taking any Aspirin (unless otherwise instructed by your surgeon) Aleve, Naproxen, Ibuprofen, Motrin, Advil, Goody's, BC's, all herbal medications, fish oil, and all vitamins.   The day of surgery:                     Do not wear jewelry, make up, or nail polish            Do not wear lotions, powders, perfumes, or deodorant.            Do not shave 48 hours prior to surgery.              Do not bring valuables to the hospital.            [facility] is not responsible for any belongings or valuables.  Do NOT Smoke (Tobacco/Vaping) 24 hours prior to your procedure If you use a CPAP  at night, you may bring all equipment for your overnight stay.   Contacts, glasses, dentures or bridgework may not be worn into surgery.      For patients admitted to the hospital, discharge time will be determined by your treatment team.   Patients discharged the day of surgery will not be allowed to drive home, and someone needs to stay with them for 24 hours.   Special instructions:   [place]- Preparing For Surgery  Before surgery, you can play an important role. Because skin is not sterile, your skin needs to be as free of germs as possible. You can reduce the number of germs on your skin by washing with CHG (chlorahexidine gluconate) Soap before surgery.  CHG is an antiseptic cleaner which kills germs and bonds with the skin to continue killing germs even after washing.    Oral Hygiene is also important to reduce your risk of infection.  Remember - BRUSH YOUR TEETH THE MORNING OF SURGERY WITH YOUR REGULAR TOOTHPASTE  Please do not use if you have an allergy to CHG or antibacterial soaps. If your skin becomes reddened/irritated stop using the CHG.  Do not shave (including legs and underarms) for at least 48 hours prior to first  CHG shower. It is OK to shave your face.  Please follow these instructions carefully.   Shower the NIGHT BEFORE SURGERY and the MORNING OF SURGERY with DIAL Soap.   Pat yourself dry with a CLEAN TOWEL.  Wear CLEAN PAJAMAS to bed the night before surgery  Place CLEAN SHEETS on your bed the night of your first shower and DO NOT SLEEP WITH PETS.   Day of Surgery: Please shower morning of surgery  Wear Clean/Comfortable clothing the morning of surgery Do not apply any deodorants/lotions.   Remember to brush your teeth WITH YOUR REGULAR TOOTHPASTE.   Questions were answered. Patient verbalized understanding of instructions.

## 2021-12-24 ENCOUNTER — Other Ambulatory Visit: Payer: Self-pay | Admitting: Radiology

## 2021-12-24 LAB — PLATELET INHIBITION P2Y12

## 2021-12-24 NOTE — Anesthesia Preprocedure Evaluation (Addendum)
Anesthesia Evaluation  Patient identified by MRN, date of birth, ID band Patient awake    Reviewed: Allergy & Precautions, NPO status , Patient's Chart, lab work & pertinent test results  History of Anesthesia Complications (+) PONV, POST - OP SPINAL HEADACHE and history of anesthetic complications  Airway Mallampati: II  TM Distance: >3 FB Neck ROM: Full    Dental no notable dental hx.    Pulmonary sleep apnea , COPD,  COPD inhaler, Patient abstained from smoking., former smoker,    Pulmonary exam normal        Cardiovascular hypertension, Pt. on medications + CAD, + Cardiac Stents and + Peripheral Vascular Disease   Rhythm:Regular Rate:Normal     Neuro/Psych  Headaches, Anxiety Depression Bipolar Disorder Brain aneurysm    GI/Hepatic Neg liver ROS, GERD  Medicated,  Endo/Other  diabetes, Well Controlled  Renal/GU Renal disease     Musculoskeletal   Abdominal Normal abdominal exam  (+)   Peds  Hematology  (+) Blood dyscrasia, anemia , Lab Results      Component                Value               Date                      WBC                      12.4 (H)            11/25/2021                HGB                      12.0                11/25/2021                HCT                      35.6 (L)            11/25/2021                MCV                      89.0                11/25/2021                PLT                      420 (H)             11/25/2021           Lab Results      Component                Value               Date                      NA                       137                 11/25/2021  K                        3.0 (L)             11/25/2021                CO2                      21 (L)              11/25/2021                GLUCOSE                  134 (H)             11/25/2021                BUN                      13                  11/25/2021                CREATININE                0.79                11/25/2021                CALCIUM                  8.9                 11/25/2021                EGFR                     93                  08/21/2021                GFRNONAA                 >60                 11/25/2021             Anesthesia Other Findings   Reproductive/Obstetrics                           Anesthesia Physical Anesthesia Plan  ASA: 3  Anesthesia Plan: General   Post-op Pain Management:    Induction: Intravenous  PONV Risk Score and Plan: 4 or greater and Ondansetron, Dexamethasone, Midazolam and Treatment may vary due to age or medical condition  Airway Management Planned: Mask and Oral ETT  Additional Equipment: Arterial line  Intra-op Plan:   Post-operative Plan: Extubation in OR  Informed Consent: I have reviewed the patients History and Physical, chart, labs and discussed the procedure including the risks, benefits and alternatives for the proposed anesthesia with the patient or authorized representative who has indicated his/her understanding and acceptance.     Dental advisory given  Plan Discussed with: CRNA  Anesthesia Plan Comments: (PAT note written 12/24/2021 by Shonna Chock, PA-C. Cath 09/11/2020: Conclusions: 1. Moderately severe multivessel coronary artery disease including mild plaquing of the proximal and distal LAD, 40% in-stent restenosis of ostial LCx,  30% distal LCx lesion, and focal 60% mid RCA stenosis. 2. Hyperdynamic left ventricular contraction with moderately elevated filling pressure. 3. Significant difficulty attaining adequate sedation during procedure. The patient would fluctuate between marked agitation and sleep every few minutes after having received 3 mg of midazolam and 75 mcg of fentanyl.)      Anesthesia Quick Evaluation

## 2021-12-24 NOTE — Progress Notes (Signed)
Anesthesia Follow-up:  Case: [id number] Date/Time: 12/25/21 0815   Procedure: EMBOLIZATION   Anesthesia type: General   Pre-op diagnosis: brain anuerysm   Location: [facility] [place] [facility]   Surgeons: Luanne Bras, MD       DISCUSSION: See 11/07/21 anesthesia APP note by Karoline Caldwell, PA-C.  He noted: "Follows cardiology for history of CAD s/p PCI to left circumflex with chronic stable angina, HTN, HLD, OSA (not using CPAP).  Last seen by Dr. Saunders Revel 08/21/2021.  Noted that her chest pain symptoms had improved with escalation of isosorbide mononitrate 90 mg daily.  ABIs were ordered due to patient report of leg cramping (test done 09/26/2021 showed evidence of some narrowing/blockage in small arteries in her calf/feet, however large arteries had no significant disease).  She was recommended follow-up in 6 months.   Former smoker.  30-pack-year history.  Quit 12/31/2020.  She has COPD and is maintained on Breztri.   Follows with hematology for history of waxing and waning leukocytosis and thrombocytosis.  Last seen 08/27/2021 WBC was normal and platelets mildly elevated at 475.  Myeloproliferative work-up in the past has been otherwise unremarkable.  Recommended yearly follow-up."   Since 11/07/21 note, she underwent 4V cerebral arteriogram on 11/08/21 showing: Findings. 1.Approximately 4.5 mm x 3 mm right P-comm artery aneurysm. 2.  Approximately 2.5 x 2.5 mm wide necked lobulated right MCA trifurcation aneurysm. 3.  Severe bilateral vertebral artery origin stenosis.   She is now scheduled for cerebral arteriogram with possible right posterior communicating artery and right middle cerebral artery aneurysm embolization by Dr. Estanislado Pandy on 12/25/21. She initially came in on 11/25/21, but procedure was cancelled because she had just a few days prior been treated with a prednisone Dosepak for SOB/wheezing (negative COVID test). She was referred back to her PCP McLean-Scocuzza, Nino Glow, MD and has been  re-evaluated on 11/28/21 and 12/06/21. Wheezing had improved on course of steroids and Azithromycin. Medications for chronic bronchitis include Combivent as needed, Duoneb as needed, Xyzal as needed, Atrovent nasal spray as needed, Mucinex 600 mg daily, Hycodan syrup as needed, NAC 600 mg (not taking), Breztri BID.   She is a same-day work-up, so anesthesia team to evaluate on the day of surgery. Labs on arrival as indicated. She remains on ASA and Plavix as prescribed.    VS:  BP Readings from Last 3 Encounters:  12/06/21 140/80  11/28/21 120/80  11/25/21 (!) 154/88   Pulse Readings from Last 3 Encounters:  12/06/21 72  11/28/21 87  11/25/21 75     PROVIDERS: McLean-Scocuzza, Nino Glow, MD is PCP  End, Harrell Gave, MD is cardiologist   LABS: For day of procedure as indicated. Latest results include: Lab Results  Component Value Date   WBC 12.4 (H) 11/25/2021   HGB 12.0 11/25/2021   HCT 35.6 (L) 11/25/2021   PLT 420 (H) 11/25/2021   GLUCOSE 134 (H) 11/25/2021   ALT 58 (H) 08/21/2021   AST 41 (H) 08/21/2021   NA 137 11/25/2021   K 3.0 (L) 11/25/2021   CL 107 11/25/2021   CREATININE 0.79 11/25/2021   BUN 13 11/25/2021   CO2 21 (L) 11/25/2021   INR 0.9 11/25/2021   HGBA1C 6.3 (A) 10/25/2021    IMAGES: CXR 11/28/21: FINDINGS: The heart size and mediastinal contours are within normal limits. Both lungs are clear. The visualized skeletal structures are unremarkable. IMPRESSION: No active cardiopulmonary disease.     EKG 04/30/21: NSR. Rate 71. Nonspecific t wave abnormality.  Cath 09/11/2020: Conclusions: Moderately severe multivessel coronary artery disease including mild plaquing of the proximal and distal LAD, 40% in-stent restenosis of ostial LCx, 30% distal LCx lesion, and focal 60% mid RCA stenosis. Hyperdynamic left ventricular contraction with moderately elevated filling pressure. Significant difficulty attaining adequate sedation during procedure.  The  patient would fluctuate between marked agitation and sleep every few minutes after having received 3 mg of midazolam and 75 mcg of fentanyl.   Recommendations: Continue current antianginal therapy with escalation as tolerated if recurrent angina occurs. Add furosemide 20 mg daily for gentle diuresis in the setting of elevated LVEDP consistent with diastolic dysfunction.  This may be contributing to the patient's symptoms. If the patient has refractory angina, functional study to assess hemodynamic significance of LCx/RCA disease is recommended.  If catheterization/PCI is needed in the future, involvement of anesthesia will need to be considered.   TTE 03/16/2019:  1. The left ventricle has normal systolic function with an ejection  fraction of 60-65%. The cavity size was normal. Left ventricular diastolic  Doppler parameters are consistent with impaired relaxation.   2. The right ventricle has normal systolic function. The cavity was  normal. There is no increase in right ventricular wall thickness.Unable to  estimate RVSP.     Past Medical History:  Diagnosis Date   Anxiety    Asthma    Bipolar 1 disorder ([place])    Bipolar disorder ([place])    CAD (coronary artery disease)    s/p stent BMS OM Cx   Cervical herniated disc 04/12/2016   COPD (chronic obstructive pulmonary disease) (HCC)    Depression    Diabetes mellitus with gastroparesis (HCC)    Diabetes mellitus without complication (HCC)    no meds, diet controlled   Diverticulitis    GERD (gastroesophageal reflux disease)    Glaucoma    History of blood transfusion    Hyperlipidemia    Hypertension    OSA (obstructive sleep apnea)    not using cpap    Plantar fasciitis    b/l feet s/p steroid shots w/o help and left surgery w/o help    PONV (postoperative nausea and vomiting)    Spinal headache    UTI (urinary tract infection)     Past Surgical History:  Procedure Laterality Date   ABDOMINAL HYSTERECTOMY     ABDOMINAL  SURGERY  1995   Bowel resection.   CARDIAC CATHETERIZATION N/A 04/14/2016   Procedure: Left Heart Cath and Coronary Angiography;  Surgeon: Burnell Blanks, MD;  Location: [place] [facility];  Service: Cardiovascular;  Laterality: N/A;   CARDIAC SURGERY     COLONOSCOPY WITH PROPOFOL N/A 07/11/2019   Procedure: COLONOSCOPY WITH PROPOFOL;  Surgeon: Jonathon Bellows, MD;  Location: [facility] ENDOSCOPY;  Service: Gastroenterology;  Laterality: N/A;   COLONOSCOPY WITH PROPOFOL N/A 07/29/2019   Procedure: COLONOSCOPY WITH PROPOFOL;  Surgeon: Jonathon Bellows, MD;  Location: [facility] ENDOSCOPY;  Service: Gastroenterology;  Laterality: N/A;   CORONARY ANGIOPLASTY WITH STENT PLACEMENT  2010   Drug eluting stent   ESOPHAGOGASTRODUODENOSCOPY (EGD) WITH PROPOFOL N/A 07/11/2019   Procedure: ESOPHAGOGASTRODUODENOSCOPY (EGD) WITH PROPOFOL;  Surgeon: Jonathon Bellows, MD;  Location: [facility] ENDOSCOPY;  Service: Gastroenterology;  Laterality: N/A;   IR 3D INDEPENDENT WKST  11/08/2021   IR ANGIO INTRA EXTRACRAN SEL COM CAROTID INNOMINATE BILAT MOD SED  11/08/2021   IR ANGIO VERTEBRAL SEL VERTEBRAL BILAT MOD SED  11/08/2021   IR RADIOLOGIST EVAL & MGMT  10/16/2021   IR US GUIDE VASC  ACCESS RIGHT  11/08/2021   LEFT HEART CATH AND CORONARY ANGIOGRAPHY Left 09/11/2020   Procedure: LEFT HEART CATH AND CORONARY ANGIOGRAPHY;  Surgeon: Nelva Bush, MD;  Location: [place] [facility];  Service: Cardiovascular;  Laterality: Left;   OVARIAN CYST REMOVAL     RADIOLOGY WITH ANESTHESIA N/A 11/08/2021   Procedure: Cyril Loosen;  Surgeon: Luanne Bras, MD;  Location: [place];  Service: Radiology;  Laterality: N/A;    MEDICATIONS: No current facility-administered medications for this encounter.    albuterol (PROAIR HFA) 108 (90 Base) MCG/ACT inhaler   alum & mag hydroxide-simeth (MAALOX/MYLANTA) 200-200-20 MG/5ML suspension   amLODipine (NORVASC) 10 MG tablet   aspirin 81 MG chewable tablet   atorvastatin (LIPITOR) 80 MG tablet   baclofen  (LIORESAL) 10 MG tablet   bismuth subsalicylate (PEPTO BISMOL) 262 MG/15ML suspension   Budeson-Glycopyrrol-Formoterol (BREZTRI AEROSPHERE) 160-9-4.8 MCG/ACT AERO   clopidogrel (PLAVIX) 75 MG tablet   dicyclomine (BENTYL) 20 MG tablet   divalproex (DEPAKOTE) 500 MG DR tablet   ezetimibe (ZETIA) 10 MG tablet   guaiFENesin (MUCINEX) 600 MG 12 hr tablet   HYDROcodone bit-homatropine (HYCODAN) 5-1.5 MG/5ML syrup   ipratropium (ATROVENT) 0.06 % nasal spray   Ipratropium-Albuterol (COMBIVENT RESPIMAT) 20-100 MCG/ACT AERS respimat   ipratropium-albuterol (DUONEB) 0.5-2.5 (3) MG/3ML SOLN   isosorbide mononitrate (IMDUR) 30 MG 24 hr tablet   levocetirizine (XYZAL) 5 MG tablet   methocarbamol (ROBAXIN) 500 MG tablet   metoCLOPramide (REGLAN) 5 MG tablet   nitroGLYCERIN (NITROSTAT) 0.4 MG SL tablet   olmesartan-hydrochlorothiazide (BENICAR HCT) 20-12.5 MG tablet   ondansetron (ZOFRAN-ODT) 4 MG disintegrating tablet   pantoprazole (PROTONIX) 40 MG tablet   potassium chloride (MICRO-K) 10 MEQ CR capsule   Probiotic Product (PROBIOTIC DAILY PO)   promethazine (PHENERGAN) 25 MG tablet   REPATHA SURECLICK 017 MG/ML SOAJ   topiramate (TOPAMAX) 50 MG tablet   Acetylcysteine (NAC) 600 MG CAPS    Myra Gianotti, PA-C Surgical Short Stay/Anesthesiology [facility] Phone [phone] [facility] Phone [phone] 12/24/2021 11:18 AM

## 2021-12-25 ENCOUNTER — Encounter (HOSPITAL_COMMUNITY)
Admission: RE | Disposition: A | Discharge status: Home / Self Care | Payer: Self-pay | Source: Ambulatory Visit | Attending: Interventional Radiology

## 2021-12-25 ENCOUNTER — Inpatient Hospital Stay (HOSPITAL_COMMUNITY)
Admission: RE | Admit: 2021-12-25 | Discharge: 2021-12-25 | Disposition: A | Discharge status: Home / Self Care | Payer: Medicare Other | Source: Ambulatory Visit | Attending: Interventional Radiology | Admitting: Interventional Radiology

## 2021-12-25 ENCOUNTER — Encounter (HOSPITAL_COMMUNITY): Payer: Self-pay | Admitting: Interventional Radiology

## 2021-12-25 ENCOUNTER — Inpatient Hospital Stay (HOSPITAL_COMMUNITY): Payer: Medicare Other | Admitting: Vascular Surgery

## 2021-12-25 ENCOUNTER — Other Ambulatory Visit: Payer: Self-pay

## 2021-12-25 ENCOUNTER — Inpatient Hospital Stay (HOSPITAL_COMMUNITY)
Admission: RE | Admit: 2021-12-25 | Discharge: 2021-12-26 | DRG: 027 | Disposition: A | Discharge status: Home / Self Care | Payer: Medicare Other | Source: Ambulatory Visit | Attending: Interventional Radiology | Admitting: Interventional Radiology

## 2021-12-25 DIAGNOSIS — Z8249 Family history of ischemic heart disease and other diseases of the circulatory system: Secondary | ICD-10-CM

## 2021-12-25 DIAGNOSIS — Z955 Presence of coronary angioplasty implant and graft: Secondary | ICD-10-CM

## 2021-12-25 DIAGNOSIS — Z91011 Allergy to milk products: Secondary | ICD-10-CM | POA: Diagnosis not present

## 2021-12-25 DIAGNOSIS — Z888 Allergy status to other drugs, medicaments and biological substances status: Secondary | ICD-10-CM

## 2021-12-25 DIAGNOSIS — Z7951 Long term (current) use of inhaled steroids: Secondary | ICD-10-CM | POA: Diagnosis not present

## 2021-12-25 DIAGNOSIS — J449 Chronic obstructive pulmonary disease, unspecified: Secondary | ICD-10-CM | POA: Diagnosis not present

## 2021-12-25 DIAGNOSIS — E1143 Type 2 diabetes mellitus with diabetic autonomic (poly)neuropathy: Secondary | ICD-10-CM | POA: Diagnosis not present

## 2021-12-25 DIAGNOSIS — G4733 Obstructive sleep apnea (adult) (pediatric): Secondary | ICD-10-CM | POA: Diagnosis not present

## 2021-12-25 DIAGNOSIS — Z9889 Other specified postprocedural states: Principal | ICD-10-CM

## 2021-12-25 DIAGNOSIS — K219 Gastro-esophageal reflux disease without esophagitis: Secondary | ICD-10-CM | POA: Diagnosis present

## 2021-12-25 DIAGNOSIS — E785 Hyperlipidemia, unspecified: Secondary | ICD-10-CM | POA: Diagnosis not present

## 2021-12-25 DIAGNOSIS — Z91040 Latex allergy status: Secondary | ICD-10-CM | POA: Diagnosis not present

## 2021-12-25 DIAGNOSIS — I671 Cerebral aneurysm, nonruptured: Secondary | ICD-10-CM

## 2021-12-25 DIAGNOSIS — Z90710 Acquired absence of both cervix and uterus: Secondary | ICD-10-CM | POA: Diagnosis not present

## 2021-12-25 DIAGNOSIS — F419 Anxiety disorder, unspecified: Secondary | ICD-10-CM | POA: Diagnosis present

## 2021-12-25 DIAGNOSIS — F319 Bipolar disorder, unspecified: Secondary | ICD-10-CM | POA: Diagnosis present

## 2021-12-25 DIAGNOSIS — K3184 Gastroparesis: Secondary | ICD-10-CM | POA: Diagnosis present

## 2021-12-25 DIAGNOSIS — Z87891 Personal history of nicotine dependence: Secondary | ICD-10-CM

## 2021-12-25 DIAGNOSIS — Z833 Family history of diabetes mellitus: Secondary | ICD-10-CM

## 2021-12-25 DIAGNOSIS — I2510 Atherosclerotic heart disease of native coronary artery without angina pectoris: Secondary | ICD-10-CM | POA: Diagnosis present

## 2021-12-25 DIAGNOSIS — Z7982 Long term (current) use of aspirin: Secondary | ICD-10-CM | POA: Diagnosis not present

## 2021-12-25 DIAGNOSIS — H409 Unspecified glaucoma: Secondary | ICD-10-CM | POA: Diagnosis present

## 2021-12-25 DIAGNOSIS — Z7902 Long term (current) use of antithrombotics/antiplatelets: Secondary | ICD-10-CM

## 2021-12-25 DIAGNOSIS — Z818 Family history of other mental and behavioral disorders: Secondary | ICD-10-CM

## 2021-12-25 DIAGNOSIS — I10 Essential (primary) hypertension: Secondary | ICD-10-CM | POA: Diagnosis present

## 2021-12-25 DIAGNOSIS — Z91048 Other nonmedicinal substance allergy status: Secondary | ICD-10-CM | POA: Diagnosis not present

## 2021-12-25 DIAGNOSIS — Z79899 Other long term (current) drug therapy: Secondary | ICD-10-CM

## 2021-12-25 DIAGNOSIS — E1151 Type 2 diabetes mellitus with diabetic peripheral angiopathy without gangrene: Secondary | ICD-10-CM | POA: Diagnosis not present

## 2021-12-25 HISTORY — DX: Nausea with vomiting, unspecified: R11.2

## 2021-12-25 HISTORY — PX: IR US GUIDE VASC ACCESS RIGHT: IMG2390

## 2021-12-25 HISTORY — PX: IR ANGIO INTRA EXTRACRAN SEL COM CAROTID INNOMINATE UNI R MOD SED: IMG5359

## 2021-12-25 HISTORY — PX: IR ANGIOGRAM FOLLOW UP STUDY: IMG697

## 2021-12-25 HISTORY — PX: IR NEURO EACH ADD'L AFTER BASIC UNI RIGHT (MS): IMG5374

## 2021-12-25 HISTORY — PX: RADIOLOGY WITH ANESTHESIA: SHX6223

## 2021-12-25 HISTORY — PX: IR 3D INDEPENDENT WKST: IMG2385

## 2021-12-25 HISTORY — PX: IR TRANSCATH/EMBOLIZ: IMG695

## 2021-12-25 HISTORY — DX: Other specified postprocedural states: Z98.890

## 2021-12-25 LAB — CBC WITH DIFFERENTIAL/PLATELET
Abs Immature Granulocytes: 0.06 10*3/uL (ref 0.00–0.07)
Basophils Absolute: 0.1 10*3/uL (ref 0.0–0.1)
Basophils Relative: 1 %
Eosinophils Absolute: 0.4 10*3/uL (ref 0.0–0.5)
Eosinophils Relative: 5 %
HCT: 36.1 % (ref 36.0–46.0)
Hemoglobin: 11.7 g/dL — ABNORMAL LOW (ref 12.0–15.0)
Immature Granulocytes: 1 %
Lymphocytes Relative: 43 %
Lymphs Abs: 3.5 10*3/uL (ref 0.7–4.0)
MCH: 29.3 pg (ref 26.0–34.0)
MCHC: 32.4 g/dL (ref 30.0–36.0)
MCV: 90.3 fL (ref 80.0–100.0)
Monocytes Absolute: 0.8 10*3/uL (ref 0.1–1.0)
Monocytes Relative: 10 %
Neutro Abs: 3.3 10*3/uL (ref 1.7–7.7)
Neutrophils Relative %: 40 %
Platelets: 319 10*3/uL (ref 150–400)
RBC: 4 MIL/uL (ref 3.87–5.11)
RDW: 13.9 % (ref 11.5–15.5)
WBC: 8.1 10*3/uL (ref 4.0–10.5)
nRBC: 0 % (ref 0.0–0.2)

## 2021-12-25 LAB — PROTIME-INR
INR: 0.9 (ref 0.8–1.2)
Prothrombin Time: 12.4 seconds (ref 11.4–15.2)

## 2021-12-25 LAB — GLUCOSE, CAPILLARY
Glucose-Capillary: 108 mg/dL — ABNORMAL HIGH (ref 70–99)
Glucose-Capillary: 128 mg/dL — ABNORMAL HIGH (ref 70–99)
Glucose-Capillary: 186 mg/dL — ABNORMAL HIGH (ref 70–99)

## 2021-12-25 LAB — SURGICAL PCR SCREEN
MRSA, PCR: NEGATIVE
Staphylococcus aureus: NEGATIVE

## 2021-12-25 LAB — HEPARIN LEVEL (UNFRACTIONATED): Heparin Unfractionated: <0.1 IU/mL — ABNORMAL LOW (ref 0.30–0.70)

## 2021-12-25 LAB — BASIC METABOLIC PANEL
Anion gap: 8 (ref 5–15)
BUN: 20 mg/dL (ref 6–20)
CO2: 24 mmol/L (ref 22–32)
Calcium: 8.6 mg/dL — ABNORMAL LOW (ref 8.9–10.3)
Chloride: 108 mmol/L (ref 98–111)
Creatinine, Ser: 0.79 mg/dL (ref 0.44–1.00)
GFR, Estimated: >60 mL/min (ref >60)
Glucose, Bld: 115 mg/dL — ABNORMAL HIGH (ref 70–99)
Potassium: 4.2 mmol/L (ref 3.5–5.1)
Sodium: 140 mmol/L (ref 135–145)

## 2021-12-25 LAB — POCT ACTIVATED CLOTTING TIME
Activated Clotting Time: 215 seconds
Activated Clotting Time: 203 seconds

## 2021-12-25 SURGERY — IR WITH ANESTHESIA
Anesthesia: General

## 2021-12-25 MED ORDER — PHENYLEPHRINE HCL-NACL 20-0.9 MG/250ML-% IV SOLN
INTRAVENOUS | Status: DC | PRN
  Administered 2021-12-25: 5 ug/min via INTRAVENOUS

## 2021-12-25 MED ORDER — SODIUM CHLORIDE 0.9 % IV SOLN: INTRAVENOUS | Status: DC

## 2021-12-25 MED ORDER — CHLORHEXIDINE GLUCONATE 0.12 % MT SOLN
15.0000 mL | Freq: Once | OROMUCOSAL | Status: AC
  Administered 2021-12-25: 15 mL via OROMUCOSAL
  Filled 2021-12-25: qty 15

## 2021-12-25 MED ORDER — LIDOCAINE HCL 1 % IJ SOLN
INTRAMUSCULAR | Status: AC
  Filled 2021-12-25: qty 20

## 2021-12-25 MED ORDER — FENTANYL CITRATE (PF) 100 MCG/2ML IJ SOLN
INTRAMUSCULAR | Status: AC
  Filled 2021-12-25: qty 2

## 2021-12-25 MED ORDER — IPRATROPIUM BROMIDE 0.06 % NA SOLN: 2.0000 | Freq: Two times a day (BID) | NASAL | Status: DC | PRN

## 2021-12-25 MED ORDER — CLOPIDOGREL BISULFATE 75 MG PO TABS
37.5000 mg | ORAL_TABLET | ORAL | Status: DC
  Filled 2021-12-25: qty 1

## 2021-12-25 MED ORDER — MIDAZOLAM HCL 2 MG/2ML IJ SOLN
INTRAMUSCULAR | Status: DC | PRN
  Administered 2021-12-25: 2 mg via INTRAVENOUS

## 2021-12-25 MED ORDER — HEPARIN SODIUM (PORCINE) 1000 UNIT/ML IJ SOLN
INTRAMUSCULAR | Status: DC | PRN
  Administered 2021-12-25: 3000 [IU] via INTRAVENOUS

## 2021-12-25 MED ORDER — PANTOPRAZOLE SODIUM 40 MG PO TBEC
40.0000 mg | DELAYED_RELEASE_TABLET | Freq: Two times a day (BID) | ORAL | Status: DC
  Administered 2021-12-26: 40 mg via ORAL
  Filled 2021-12-25: qty 1

## 2021-12-25 MED ORDER — DICYCLOMINE HCL 20 MG PO TABS: 20.0000 mg | ORAL_TABLET | Freq: Every day | ORAL | Status: DC | PRN

## 2021-12-25 MED ORDER — SUGAMMADEX SODIUM 200 MG/2ML IV SOLN
INTRAVENOUS | Status: DC | PRN
  Administered 2021-12-25: 200 mg via INTRAVENOUS

## 2021-12-25 MED ORDER — CLEVIDIPINE BUTYRATE 0.5 MG/ML IV EMUL
INTRAVENOUS | Status: AC
  Filled 2021-12-25: qty 50

## 2021-12-25 MED ORDER — LORATADINE 10 MG PO TABS: 5.0000 mg | ORAL_TABLET | Freq: Every evening | ORAL | Status: DC | PRN

## 2021-12-25 MED ORDER — HEPARIN (PORCINE) 25000 UT/250ML-% IV SOLN
500.0000 [IU]/h | INTRAVENOUS | Status: DC
  Administered 2021-12-25: 500 [IU]/h via INTRAVENOUS
  Filled 2021-12-25: qty 250

## 2021-12-25 MED ORDER — VANCOMYCIN HCL 1000 MG IV SOLR: INTRAVENOUS | Status: DC | PRN

## 2021-12-25 MED ORDER — HEPARIN (PORCINE) 25000 UT/250ML-% IV SOLN
850.0000 [IU]/h | INTRAVENOUS | Status: AC
  Administered 2021-12-25: 850 [IU]/h via INTRAVENOUS

## 2021-12-25 MED ORDER — FENTANYL CITRATE PF 50 MCG/ML IJ SOSY: 25.0000 ug | PREFILLED_SYRINGE | Freq: Four times a day (QID) | INTRAMUSCULAR | Status: DC | PRN

## 2021-12-25 MED ORDER — KETOROLAC TROMETHAMINE 30 MG/ML IJ SOLN
30.0000 mg | Freq: Three times a day (TID) | INTRAMUSCULAR | Status: DC | PRN
  Administered 2021-12-25 – 2021-12-26 (×2): 30 mg via INTRAVENOUS
  Filled 2021-12-25 (×2): qty 1

## 2021-12-25 MED ORDER — AMLODIPINE BESYLATE 10 MG PO TABS: 10.0000 mg | ORAL_TABLET | Freq: Every day | ORAL | Status: DC

## 2021-12-25 MED ORDER — CLOPIDOGREL BISULFATE 75 MG PO TABS: 75.0000 mg | ORAL_TABLET | Freq: Every day | ORAL | Status: DC

## 2021-12-25 MED ORDER — ACETAMINOPHEN 10 MG/ML IV SOLN
1000.0000 mg | Freq: Once | INTRAVENOUS | Status: DC | PRN
Start: 2021-12-25 — End: 2021-12-25

## 2021-12-25 MED ORDER — HEPARIN (PORCINE) 25000 UT/250ML-% IV SOLN
INTRAVENOUS | Status: AC
  Filled 2021-12-25: qty 250

## 2021-12-25 MED ORDER — IOHEXOL 300 MG/ML  SOLN
100.0000 mL | Freq: Once | INTRAMUSCULAR | Status: AC | PRN
  Administered 2021-12-25: 76 mL via INTRA_ARTERIAL

## 2021-12-25 MED ORDER — FENTANYL CITRATE PF 50 MCG/ML IJ SOSY
25.0000 ug | PREFILLED_SYRINGE | Freq: Four times a day (QID) | INTRAMUSCULAR | Status: DC | PRN
Start: 2021-12-25 — End: 2021-12-25

## 2021-12-25 MED ORDER — ROCURONIUM BROMIDE 10 MG/ML (PF) SYRINGE
PREFILLED_SYRINGE | INTRAVENOUS | Status: DC | PRN
  Administered 2021-12-25 (×3): 10 mg via INTRAVENOUS
  Administered 2021-12-25: 70 mg via INTRAVENOUS

## 2021-12-25 MED ORDER — DEXAMETHASONE SODIUM PHOSPHATE 10 MG/ML IJ SOLN
INTRAMUSCULAR | Status: DC | PRN
  Administered 2021-12-25: 4 mg via INTRAVENOUS

## 2021-12-25 MED ORDER — NIMODIPINE 30 MG PO CAPS
0.0000 mg | ORAL_CAPSULE | ORAL | Status: DC
  Filled 2021-12-25: qty 2

## 2021-12-25 MED ORDER — VERAPAMIL HCL 2.5 MG/ML IV SOLN
INTRAVENOUS | Status: AC | PRN
  Administered 2021-12-25: 2.5 mg via INTRA_ARTERIAL

## 2021-12-25 MED ORDER — VANCOMYCIN HCL IN DEXTROSE 1-5 GM/200ML-% IV SOLN
1000.0000 mg | Freq: Once | INTRAVENOUS | Status: AC
  Administered 2021-12-25: 1000 mg via INTRAVENOUS
  Filled 2021-12-25: qty 200

## 2021-12-25 MED ORDER — EPHEDRINE SULFATE-NACL 50-0.9 MG/10ML-% IV SOSY
PREFILLED_SYRINGE | INTRAVENOUS | Status: DC | PRN
  Administered 2021-12-25: 5 mg via INTRAVENOUS

## 2021-12-25 MED ORDER — BACLOFEN 10 MG PO TABS
10.0000 mg | ORAL_TABLET | Freq: Two times a day (BID) | ORAL | Status: DC
  Filled 2021-12-25 (×2): qty 1

## 2021-12-25 MED ORDER — ESMOLOL HCL 100 MG/10ML IV SOLN
INTRAVENOUS | Status: DC | PRN
  Administered 2021-12-25 (×3): 20 mg via INTRAVENOUS

## 2021-12-25 MED ORDER — VANCOMYCIN HCL 1000 MG IV SOLR: 1000.0000 mg | INTRAVENOUS | Status: DC

## 2021-12-25 MED ORDER — SODIUM CHLORIDE (PF) 0.9 % IJ SOLN
INTRAVENOUS | Status: AC | PRN
  Administered 2021-12-25 (×4): 25 ug via INTRA_ARTERIAL

## 2021-12-25 MED ORDER — HEPARIN (PORCINE) 25000 UT/250ML-% IV SOLN: 500.0000 [IU]/h | INTRAVENOUS | Status: DC

## 2021-12-25 MED ORDER — NITROGLYCERIN 1 MG/10 ML FOR IR/CATH LAB
INTRA_ARTERIAL | Status: AC
  Filled 2021-12-25: qty 20

## 2021-12-25 MED ORDER — BUDESON-GLYCOPYRROL-FORMOTEROL 160-9-4.8 MCG/ACT IN AERO
2.0000 | INHALATION_SPRAY | Freq: Two times a day (BID) | RESPIRATORY_TRACT | Status: DC
  Administered 2021-12-25: 2 via RESPIRATORY_TRACT

## 2021-12-25 MED ORDER — CLOPIDOGREL BISULFATE 75 MG PO TABS
75.0000 mg | ORAL_TABLET | Freq: Every day | ORAL | Status: DC
Start: 2021-12-25 — End: 2021-12-25

## 2021-12-25 MED ORDER — OLMESARTAN MEDOXOMIL-HCTZ 20-12.5 MG PO TABS: 1.0000 | ORAL_TABLET | Freq: Every day | ORAL | Status: DC

## 2021-12-25 MED ORDER — PROPOFOL 10 MG/ML IV BOLUS
INTRAVENOUS | Status: DC | PRN
  Administered 2021-12-25 (×3): 50 mg via INTRAVENOUS

## 2021-12-25 MED ORDER — BUDESON-GLYCOPYRROL-FORMOTEROL 160-9-4.8 MCG/ACT IN AERO: 2.0000 | INHALATION_SPRAY | Freq: Two times a day (BID) | RESPIRATORY_TRACT | Status: DC

## 2021-12-25 MED ORDER — ORAL CARE MOUTH RINSE: 15.0000 mL | Freq: Once | OROMUCOSAL | Status: AC

## 2021-12-25 MED ORDER — ASPIRIN 81 MG PO CHEW: 81.0000 mg | CHEWABLE_TABLET | Freq: Every day | ORAL | Status: DC

## 2021-12-25 MED ORDER — HEPARIN SODIUM (PORCINE) 1000 UNIT/ML IJ SOLN
INTRAMUSCULAR | Status: AC
  Filled 2021-12-25: qty 10

## 2021-12-25 MED ORDER — CLEVIDIPINE BUTYRATE 0.5 MG/ML IV EMUL
INTRAVENOUS | Status: DC | PRN
  Administered 2021-12-25: 2 mg/h via INTRAVENOUS

## 2021-12-25 MED ORDER — ISOSORBIDE MONONITRATE ER 60 MG PO TB24
90.0000 mg | ORAL_TABLET | Freq: Every day | ORAL | Status: DC
  Filled 2021-12-25: qty 1

## 2021-12-25 MED ORDER — LIDOCAINE 2% (20 MG/ML) 5 ML SYRINGE
INTRAMUSCULAR | Status: DC | PRN
  Administered 2021-12-25: 60 mg via INTRAVENOUS

## 2021-12-25 MED ORDER — NITROGLYCERIN 0.4 MG SL SUBL: 0.4000 mg | SUBLINGUAL_TABLET | SUBLINGUAL | Status: DC | PRN

## 2021-12-25 MED ORDER — GUAIFENESIN ER 600 MG PO TB12: 600.0000 mg | ORAL_TABLET | Freq: Every day | ORAL | Status: DC

## 2021-12-25 MED ORDER — DIVALPROEX SODIUM 500 MG PO DR TAB
500.0000 mg | DELAYED_RELEASE_TABLET | Freq: Two times a day (BID) | ORAL | Status: DC
Start: 2021-12-25 — End: 2021-12-26
  Filled 2021-12-25 (×2): qty 1

## 2021-12-25 MED ORDER — ALUM & MAG HYDROXIDE-SIMETH 200-200-20 MG/5ML PO SUSP: 30.0000 mL | Freq: Four times a day (QID) | ORAL | Status: DC | PRN

## 2021-12-25 MED ORDER — PHENYLEPHRINE 80 MCG/ML (10ML) SYRINGE FOR IV PUSH (FOR BLOOD PRESSURE SUPPORT)
PREFILLED_SYRINGE | INTRAVENOUS | Status: DC | PRN
  Administered 2021-12-25 (×2): 80 ug via INTRAVENOUS

## 2021-12-25 MED ORDER — EZETIMIBE 10 MG PO TABS: 10.0000 mg | ORAL_TABLET | Freq: Every day | ORAL | Status: DC

## 2021-12-25 MED ORDER — IPRATROPIUM-ALBUTEROL 20-100 MCG/ACT IN AERS
1.0000 | INHALATION_SPRAY | Freq: Four times a day (QID) | RESPIRATORY_TRACT | Status: DC | PRN
Start: 2021-12-25 — End: 2021-12-26

## 2021-12-25 MED ORDER — SCOPOLAMINE 1 MG/3DAYS TD PT72
1.0000 | MEDICATED_PATCH | TRANSDERMAL | Status: DC
  Administered 2021-12-25: 1.5 mg via TRANSDERMAL
  Filled 2021-12-25: qty 1

## 2021-12-25 MED ORDER — IRBESARTAN 150 MG PO TABS: 150.0000 mg | ORAL_TABLET | Freq: Every day | ORAL | Status: DC

## 2021-12-25 MED ORDER — ATORVASTATIN CALCIUM 80 MG PO TABS: 80.0000 mg | ORAL_TABLET | Freq: Every day | ORAL | Status: DC

## 2021-12-25 MED ORDER — VERAPAMIL HCL 2.5 MG/ML IV SOLN: INTRA_ARTERIAL | Status: AC | PRN

## 2021-12-25 MED ORDER — IPRATROPIUM-ALBUTEROL 20-100 MCG/ACT IN AERS: 1.0000 | INHALATION_SPRAY | Freq: Four times a day (QID) | RESPIRATORY_TRACT | Status: DC | PRN

## 2021-12-25 MED ORDER — CHLORHEXIDINE GLUCONATE CLOTH 2 % EX PADS
6.0000 | MEDICATED_PAD | Freq: Once | CUTANEOUS | Status: AC
  Administered 2021-12-25: 6 via TOPICAL

## 2021-12-25 MED ORDER — VERAPAMIL HCL 2.5 MG/ML IV SOLN
INTRAVENOUS | Status: AC
  Filled 2021-12-25: qty 2

## 2021-12-25 MED ORDER — CLEVIDIPINE BUTYRATE 0.5 MG/ML IV EMUL
0.0000 mg/h | INTRAVENOUS | Status: DC
  Administered 2021-12-25: 6 mg/h via INTRAVENOUS
  Administered 2021-12-25: 4 mg/h via INTRAVENOUS
  Administered 2021-12-25: 8 mg/h via INTRAVENOUS
  Administered 2021-12-26: 1 mg/h via INTRAVENOUS
  Filled 2021-12-25: qty 50

## 2021-12-25 MED ORDER — ONDANSETRON HCL 4 MG/2ML IJ SOLN
INTRAMUSCULAR | Status: DC | PRN
  Administered 2021-12-25: 4 mg via INTRAVENOUS

## 2021-12-25 MED ORDER — FENTANYL CITRATE (PF) 100 MCG/2ML IJ SOLN
INTRAMUSCULAR | Status: DC | PRN
  Administered 2021-12-25 (×2): 50 ug via INTRAVENOUS

## 2021-12-25 MED ORDER — HYDROCHLOROTHIAZIDE 12.5 MG PO TABS: 12.5000 mg | ORAL_TABLET | Freq: Every day | ORAL | Status: DC

## 2021-12-25 MED ORDER — FENTANYL CITRATE (PF) 100 MCG/2ML IJ SOLN
25.0000 ug | Freq: Four times a day (QID) | INTRAMUSCULAR | Status: DC | PRN
  Administered 2021-12-25: 25 ug via INTRAVENOUS

## 2021-12-25 MED ORDER — UMECLIDINIUM BROMIDE 62.5 MCG/ACT IN AEPB
1.0000 | INHALATION_SPRAY | Freq: Every day | RESPIRATORY_TRACT | Status: DC
  Filled 2021-12-25: qty 7

## 2021-12-25 MED ORDER — TOPIRAMATE 25 MG PO TABS: 50.0000 mg | ORAL_TABLET | Freq: Every day | ORAL | Status: DC

## 2021-12-25 MED ORDER — ASPIRIN 81 MG PO TBEC
81.0000 mg | DELAYED_RELEASE_TABLET | Freq: Every day | ORAL | Status: DC
  Filled 2021-12-25: qty 1

## 2021-12-25 MED ORDER — METOCLOPRAMIDE HCL 5 MG PO TABS: 5.0000 mg | ORAL_TABLET | Freq: Four times a day (QID) | ORAL | Status: DC | PRN

## 2021-12-25 MED ORDER — IPRATROPIUM-ALBUTEROL 0.5-2.5 (3) MG/3ML IN SOLN: 3.0000 mL | Freq: Four times a day (QID) | RESPIRATORY_TRACT | Status: DC | PRN

## 2021-12-25 MED ORDER — CHLORHEXIDINE GLUCONATE CLOTH 2 % EX PADS: 6.0000 | MEDICATED_PAD | Freq: Every morning | CUTANEOUS | Status: DC

## 2021-12-25 MED ORDER — ALBUTEROL SULFATE HFA 108 (90 BASE) MCG/ACT IN AERS
INHALATION_SPRAY | RESPIRATORY_TRACT | Status: DC | PRN
  Administered 2021-12-25: 2 via RESPIRATORY_TRACT

## 2021-12-25 MED ORDER — INSULIN ASPART 100 UNIT/ML IJ SOLN: 0.0000 [IU] | INTRAMUSCULAR | Status: DC | PRN

## 2021-12-25 MED ORDER — FENTANYL CITRATE (PF) 100 MCG/2ML IJ SOLN
25.0000 ug | INTRAMUSCULAR | Status: DC | PRN
  Administered 2021-12-25 (×3): 50 ug via INTRAVENOUS

## 2021-12-25 MED ORDER — MOMETASONE FURO-FORMOTEROL FUM 200-5 MCG/ACT IN AERO
2.0000 | INHALATION_SPRAY | Freq: Two times a day (BID) | RESPIRATORY_TRACT | Status: DC
  Filled 2021-12-25: qty 8.8

## 2021-12-25 MED ORDER — BISMUTH SUBSALICYLATE 262 MG/15ML PO SUSP: 30.0000 mL | Freq: Four times a day (QID) | ORAL | Status: DC | PRN

## 2021-12-25 NOTE — Progress Notes (Signed)
Pt refusing to take scheduled meds d/t pharm not being able to confirm expiration dates for meds (not in prescribed bottle) brought to [facility]. Dr. Estanislado Pandy paged for this update.

## 2021-12-25 NOTE — Procedures (Signed)
INR. Right vertebral artery and right common carotid arteriograms.  Right radial approach.  Findings.  Status post endovascular primary coiling of: Right posterior communicating artery region aneurysm.  Extubated. Difficulty maintaining O2 saturations despite extubation.  Intermittently obeys simple commands.  appropriately. Denies any headaches or nausea or vomiting  Moves all 4 works equally. Pupils 3 mm bilaterally sluggishly reactive. No facial asymmetry.  Tongue midline S.Jayme Mednick MD

## 2021-12-25 NOTE — Anesthesia Procedure Notes (Signed)
Procedure Name: Intubation Date/Time: 12/25/2021 8:48 AM  Performed by: Anastasio Auerbach, CRNAPre-anesthesia Checklist: Patient identified and Suction available Patient Re-evaluated:Patient Re-evaluated prior to induction Oxygen Delivery Method: Circle system utilized Preoxygenation: Pre-oxygenation with 100% oxygen Induction Type: IV induction Ventilation: Mask ventilation without difficulty Laryngoscope Size: Mac, 3 and Glidescope Grade View: Grade II Tube type: Oral Tube size: 7.5 mm Number of attempts: 1 Airway Equipment and Method: Stylet Placement Confirmation: ETT inserted through vocal cords under direct vision, positive ETCO2 and breath sounds checked- equal and bilateral Tube secured with: Tape Dental Injury: Teeth and Oropharynx as per pre-operative assessment  Difficulty Due To: Difficulty was unanticipated Future Recommendations: Recommend- induction with short-acting agent, and alternative techniques readily available Comments: Attempted with MAC 3 X 1 with only view of tip of epiglottis.  Returned to mask ventilate and on 2nd attempt used glidescope with grade 2 view.  ETT easily placed and + EtCo2

## 2021-12-25 NOTE — Sedation Documentation (Signed)
Pt transported to PACU bay 24 via stretcher accompanied by RN, CRNA and anesthesiologist. Arnette Norris RN at bedside to receive pt. Handoff complete. Right wrist remains level 0. Pt following commands. No s/s of distress at this time.

## 2021-12-25 NOTE — Transfer of Care (Signed)
Immediate Anesthesia Transfer of Care Note  Patient: Shawna Anderson  Procedure(s) Performed: EMBOLIZATION  Patient Location: PACU  Anesthesia Type:General  Level of Consciousness: awake, alert  and oriented  Airway & Oxygen Therapy: Patient Spontanous Breathing and non-rebreather face mask  Post-op Assessment: Report given to RN, Post -op Vital signs reviewed and stable and Patient moving all extremities X 4  Post vital signs: Reviewed and stable  Last Vitals:  Vitals Value Taken Time  BP 112/58 12/25/21 1215  Temp    Pulse 83 12/25/21 1225  Resp 30 12/25/21 1226  SpO2 96 % 12/25/21 1226  Vitals shown include unvalidated device data.  Last Pain:  Vitals:   12/25/21 0705  TempSrc:   PainSc: 10-Worst pain ever         Complications:  Encounter Notable Events  Notable Event Outcome Phase Comment  Difficult to intubate - unexpected  Intraprocedure Filed from anesthesia note documentation.

## 2021-12-25 NOTE — Progress Notes (Signed)
Pt agitated and requesting to take her home meds rather than meds here. I educated the pt that we would have to get pharm to approve home meds before we can admin. She states an understanding. We will take meds to pharm once they are brought to the hospital. This will cause a delay in HS meds.

## 2021-12-25 NOTE — Anesthesia Procedure Notes (Signed)
Arterial Line Insertion Start/End6/14/2023 8:18 AM, 12/25/2021 8:23 AM Performed by: Darral Dash, DO, anesthesiologist  Patient location: Pre-op. Preanesthetic checklist: patient identified, IV checked, site marked, risks and benefits discussed, surgical consent, monitors and equipment checked, pre-op evaluation, timeout performed and anesthesia consent Lidocaine 1% used for infiltration Left, radial was placed Catheter size: 20 G Hand hygiene performed  and maximum sterile barriers used   Attempts: 1 Procedure performed using ultrasound guided technique. Ultrasound Notes:anatomy identified, needle tip was noted to be adjacent to the nerve/plexus identified and no ultrasound evidence of intravascular and/or intraneural injection Following insertion, dressing applied and Biopatch. Post procedure assessment: normal and unchanged  Post procedure complications: second provider assisted and unsuccessful attempts. Patient tolerated the procedure well with no immediate complications. Additional procedure comments: - initial attempt by CRNA team unsuccessful. Single attempt under US guidance by self. Marland Kitchen

## 2021-12-25 NOTE — Progress Notes (Signed)
ANTICOAGULATION CONSULT NOTE - Initial Consult  Pharmacy Consult for heparin  Indication:  angiogram with coiling  Allergies  Allergen Reactions   Ativan [Lorazepam]     Pt states it makes her tongue do weird things    Augmentin [Amoxicillin-Pot Clavulanate]     Upset stomach    Lactose Intolerance (Gi)     Gi upset   Latex Other (See Comments)    Patient stated that she was told by her doctor that she is "allergic to" this   Tape Other (See Comments)    Patient stated that she was told by her doctor that she is "allergic to" this   Xifaxan [Rifaximin]     Pt believes this is contributing to her abdominal pain/discomfort   Drixoral Allergy Sinus [Dexbromphen-Pse-Apap Er] Rash    Patient Measurements: Height: '5\' 3"'$  (160 cm) Weight: 85.7 kg (189 lb) IBW/kg (Calculated) : 52.4 Heparin Dosing Weight: 72kg  Vital Signs: Temp: 98 F (36.7 C) (06/14 2004) Temp Source: Oral (06/14 2004) BP: 118/71 (06/14 2004) Pulse Rate: 88 (06/14 2004)  Labs: Recent Labs    12/25/21 0641 12/25/21 1900  HGB 11.7*  --   HCT 36.1  --   PLT 319  --   LABPROT 12.4  --   INR 0.9  --   HEPARINUNFRC  --  <0.10*  CREATININE 0.79  --      Estimated Creatinine Clearance: 83.4 mL/min (by C-G formula based on SCr of 0.79 mg/dL).   Medical History: Past Medical History:  Diagnosis Date   Anxiety    Asthma    Bipolar 1 disorder ([place])    Bipolar disorder ([place])    CAD (coronary artery disease)    s/p stent BMS OM Cx   Cervical herniated disc 04/12/2016   COPD (chronic obstructive pulmonary disease) (HCC)    Depression    Diabetes mellitus with gastroparesis (HCC)    Diabetes mellitus without complication (HCC)    no meds, diet controlled   Diverticulitis    GERD (gastroesophageal reflux disease)    Glaucoma    History of blood transfusion    Hyperlipidemia    Hypertension    OSA (obstructive sleep apnea)    not using cpap    Plantar fasciitis    b/l feet s/p steroid shots w/o  help and left surgery w/o help    PONV (postoperative nausea and vomiting)    Spinal headache    UTI (urinary tract infection)     Medications:  Medications Prior to Admission  Medication Sig Dispense Refill Last Dose   albuterol (PROAIR HFA) 108 (90 Base) MCG/ACT inhaler Inhale 1-2 puffs into the lungs every 4 (four) hours as needed for wheezing or shortness of breath. (Patient taking differently: Inhale 1-2 puffs into the lungs 2 (two) times daily.) 54 g 3 12/25/2021 at 0500   alum & mag hydroxide-simeth (MAALOX/MYLANTA) 200-200-20 MG/5ML suspension Take 30 mLs by mouth every 6 (six) hours as needed (Sulfa Burps).   12/24/2021   amLODipine (NORVASC) 10 MG tablet TAKE 1 TABLET BY MOUTH AT  BEDTIME 90 tablet 3 12/24/2021   aspirin 81 MG chewable tablet Chew 81 mg by mouth daily.   12/25/2021 at 0720   atorvastatin (LIPITOR) 80 MG tablet TAKE 1 TABLET BY MOUTH  DAILY AT 6 PM. 90 tablet 3 12/24/2021   baclofen (LIORESAL) 10 MG tablet Take 10 mg by mouth 2 (two) times daily.   01/02/2978   bismuth subsalicylate (PEPTO BISMOL) 262 MG/15ML suspension Take  30 mLs by mouth every 6 (six) hours as needed (Sulfa burps).   Past Week   Budeson-Glycopyrrol-Formoterol (BREZTRI AEROSPHERE) 160-9-4.8 MCG/ACT AERO Inhale 2 puffs into the lungs in the morning and at bedtime. Rinse 32.1 g 3 12/24/2021   clopidogrel (PLAVIX) 75 MG tablet TAKE 1 TABLET BY MOUTH  DAILY (Patient taking differently: Take 37.5 mg by mouth daily.) 90 tablet 3 12/25/2021 at 0500   dicyclomine (BENTYL) 20 MG tablet Take 1 tablet (20 mg total) by mouth 4 (four) times daily -  before meals and at bedtime. (Patient taking differently: Take 20 mg by mouth daily as needed (Vomiting).) 120 tablet 2 Past Week   divalproex (DEPAKOTE) 500 MG DR tablet TAKE 1 TABLET BY MOUTH  TWICE DAILY 180 tablet 3 12/25/2021 at 0500   ezetimibe (ZETIA) 10 MG tablet TAKE 1 TABLET BY MOUTH  DAILY WITH LIPITOR 80 MG AT NIGHT 90 tablet 3 12/24/2021   guaiFENesin (MUCINEX) 600  MG 12 hr tablet Take 600 mg by mouth daily.   12/24/2021   Ipratropium-Albuterol (COMBIVENT RESPIMAT) 20-100 MCG/ACT AERS respimat Inhale 1 puff into the lungs every 6 (six) hours as needed (bronchitis).   12/25/2021 at 0500   isosorbide mononitrate (IMDUR) 30 MG 24 hr tablet Take 3 tablets (90 mg) by mouth once daily (Patient taking differently: Take 90 mg by mouth daily.) 90 tablet 6 12/25/2021 at 0500   methocarbamol (ROBAXIN) 500 MG tablet Take 1 tablet (500 mg total) by mouth 2 (two) times daily as needed for muscle spasms. 180 tablet 1 Past Month   metoCLOPramide (REGLAN) 5 MG tablet TAKE 1 TABLET BY MOUTH 4  TIMES DAILY BEFORE MEALS  AND AT BEDTIME (Patient taking differently: Take 5 mg by mouth 4 (four) times daily as needed for nausea or vomiting.) 360 tablet 2 Past Week   nitroGLYCERIN (NITROSTAT) 0.4 MG SL tablet DISSOLVE 1 TABLET UNDER  TONGUE EVERY 5 MINUTES AS  NEEDED FOR CHEST PAIN MAX  OF 3 TABLETS IN 15 MINUTES  . CALL 911 AFTER THIRD DOSE 25 tablet 4 Past Week   olmesartan-hydrochlorothiazide (BENICAR HCT) 20-12.5 MG tablet Take 1 tablet by mouth daily. In am if BP >130/>80 take 2 pills to equal 40-25 mg qd 180 tablet 3 12/24/2021   ondansetron (ZOFRAN-ODT) 4 MG disintegrating tablet DISSOLVE 1 TABLET ON TOP OF THE  TONGUE EVERY 8 HOURS AS NEEDED  FOR NAUEA OR VOMITING 270 tablet 1 Past Week   pantoprazole (PROTONIX) 40 MG tablet TAKE 1 TABLET BY MOUTH  TWICE DAILY BEFORE MEALS 180 tablet 3 12/25/2021 at 0500   potassium chloride (MICRO-K) 10 MEQ CR capsule Take 2 capsules (20 mEq total) by mouth daily. 180 capsule 3 12/25/2021 at 0500   Probiotic Product (PROBIOTIC DAILY PO) Take 200 mg by mouth daily.   12/25/2021 at 0500   promethazine (PHENERGAN) 25 MG tablet TAKE 1 TABLET BY MOUTH TWICE  DAILY AS NEEDED FOR NAUSEA OR  VOMITING 180 tablet 2 Past Week   REPATHA SURECLICK 254 MG/ML SOAJ INJECT '140MG'$  SUBCUTANEOUSLY  EVERY 2 WEEKS 6 mL 1 Past Week   topiramate (TOPAMAX) 50 MG tablet Take 50  mg by mouth at bedtime.   12/24/2021   Acetylcysteine (NAC) 600 MG CAPS Take 1 capsule (600 mg total) by mouth 2 (two) times daily. prn (Patient not taking: Reported on 12/20/2021) 180 capsule 3 Not Taking   HYDROcodone bit-homatropine (HYCODAN) 5-1.5 MG/5ML syrup Take 5 mLs by mouth at bedtime as needed for cough. 120 mL  0 Unknown   ipratropium (ATROVENT) 0.06 % nasal spray USE 2 SPRAYS IN BOTH  NOSTRILS 3 TIMES DAILY (Patient taking differently: Place 2 sprays into both nostrils 2 (two) times daily as needed (Congestion).) 75 mL 4 Unknown   ipratropium-albuterol (DUONEB) 0.5-2.5 (3) MG/3ML SOLN Take 3 mLs by nebulization every 6 (six) hours as needed. (Patient taking differently: Take 3 mLs by nebulization every 6 (six) hours as needed (Shortness of breath & bronchitis).) 75 mL 0 Unknown   levocetirizine (XYZAL) 5 MG tablet Take 5 mg by mouth at bedtime as needed for allergies.   Unknown   Scheduled:   amLODipine  10 mg Oral QHS   [START ON 12/26/2021] aspirin  81 mg Oral Daily   Or   [START ON 12/26/2021] aspirin  81 mg Per Tube Daily   atorvastatin  80 mg Oral Daily   baclofen  10 mg Oral BID   [START ON 12/26/2021] Chlorhexidine Gluconate Cloth  6 each Topical q morning   [START ON 12/26/2021] clopidogrel  75 mg Oral Daily   Or   [START ON 12/26/2021] clopidogrel  75 mg Per Tube Daily   divalproex  500 mg Oral BID   ezetimibe  10 mg Oral Daily   fentaNYL       fentaNYL       fentaNYL       guaiFENesin  600 mg Oral Daily   irbesartan  150 mg Oral Daily   And   hydrochlorothiazide  12.5 mg Oral Daily   [START ON 12/26/2021] isosorbide mononitrate  90 mg Oral Daily   mometasone-formoterol  2 puff Inhalation BID   And   [START ON 12/26/2021] umeclidinium bromide  1 puff Inhalation Daily   pantoprazole  40 mg Oral BID AC   topiramate  50 mg Oral QHS   Infusions:   sodium chloride     clevidipine     clevidipine     clevidipine 4 mg/hr (12/25/21 1722)   heparin     heparin       Assessment: Pt is s/p endovascular coiling. Heparin until 6/15 at 0800. Heparin level came back undetectable tonight. We will increase but will not check another level since it's being stopped in AM.   Goal of Therapy:  Heparin level 0.1-0.25 units/ml Monitor platelets by anticoagulation protocol: Yes   Plan:  Increase hparin 850 units/hr Stop in AM at [place], PharmD, BCIDP, AAHIVP, CPP Infectious Disease Pharmacist 12/25/2021 9:10 PM

## 2021-12-25 NOTE — Anesthesia Postprocedure Evaluation (Signed)
Anesthesia Post Note  Patient: Shawna Anderson  Procedure(s) Performed: EMBOLIZATION     Patient location during evaluation: PACU Anesthesia Type: General Level of consciousness: awake and alert Pain management: pain level controlled Vital Signs Assessment: post-procedure vital signs reviewed and stable Respiratory status: spontaneous breathing, nonlabored ventilation, respiratory function stable and patient connected to nasal cannula oxygen Cardiovascular status: blood pressure returned to baseline and stable Postop Assessment: no apparent nausea or vomiting Anesthetic complications: yes   Encounter Notable Events  Notable Event Outcome Phase Comment  Difficult to intubate - unexpected  Intraprocedure Filed from anesthesia note documentation.    Last Vitals:  Vitals:   12/25/21 1400 12/25/21 1415  BP: 107/60 135/65  Pulse: 71 74  Resp: 17 10  Temp:    SpO2: 97% 95%    Last Pain:  Vitals:   12/25/21 1340  TempSrc:   PainSc: Asleep                 Belenda Cruise P Chaitra Mast

## 2021-12-25 NOTE — H&P (Signed)
Chief Complaint: Patient was seen in consultation today for cerebral arteriogram with aneurysm embolization    Supervising Physician: Luanne Bras  Patient Status: [facility] - Out-pt  History of Present Illness: Shawna Anderson is a 56 y.o. female   Worsening headaches for 2-3 months Work up revealing cerebral aneurysms Pt was referred to Dr Estanislado Pandy Consultation 10/15/2021 Hx CAD; HTN; HLD; DM; asthma; sleep apnea; anxiety Denies speech or vision issues Denies N/V Denies weakness; dizziness P2y12   7 on 12/19/21  Dr Estanislado Pandy note: 10/15/21: Brought to her attention was the 2 intracranial aneurysms. One in the right PCOM region and one in the right middle cerebral artery bifurcation region. The PCOM artery aneurysm measures approximately 4.25 x 3 mm, and right MCA trifurcation aneurysm measures approximately 3.1 mm x 3 mm. The natural history of unruptured intracranial aneurysms was reviewed in detail. Risk of rupture of approximately 0.9-2% per year prior aneurysm was reviewed with the patient with potential severe morbidity and mortality. She was informed of the increased risk of rupture was associated with hypertension, smoking, female gender, and family history of intracranial aneurysms of which she is not sure.     The patient also is known be a chronic user of marijuana at least 2 times a day, with past history of cocaine and opiate use.      The option of conservative management with a regular followups with neuro imaging were also discussed. The patient has expressed her desire to proceed with treatment of these aneurysms. Patient informed next it would be to obtain a diagnostic catheter arteriogram to better delineate the morphology of the aneurysms to plan subsequent endovascular treatment.   The diagnostic catheter arteriogram procedure was discussed with her. Patient would come as an outpatient have the procedure done, have the study reviewed, and be  discharged home within 3-4 hours. Depending all the diagnostic angiographic findings, further management would be planned. The patient is already on Plavix 75 mg a day, and aspirin 81 mg a day on account of her coronary stents. Diagnostic angiogram will be undertaken as soon as possible. Patient advised to refrain from smoking tobacco, or use of cocaine or marijuana.  Scheduled today for cerebral arteriogram with possible Rt Posterior communicating artery aneurysm embolization and Right middle cerebral artery aneurysm embolization This procedure has been rescheduled twice secondary pt respiratory illness She has been treated and feeling well today   Past Medical History:  Diagnosis Date   Anxiety    Asthma    Bipolar 1 disorder ([place])    Bipolar disorder ([place])    CAD (coronary artery disease)    s/p stent BMS OM Cx   Cervical herniated disc 04/12/2016   COPD (chronic obstructive pulmonary disease) (HCC)    Depression    Diabetes mellitus with gastroparesis (HCC)    Diabetes mellitus without complication (HCC)    no meds, diet controlled   Diverticulitis    GERD (gastroesophageal reflux disease)    Glaucoma    History of blood transfusion    Hyperlipidemia    Hypertension    OSA (obstructive sleep apnea)    not using cpap    Plantar fasciitis    b/l feet s/p steroid shots w/o help and left surgery w/o help    PONV (postoperative nausea and vomiting)    Spinal headache    UTI (urinary tract infection)     Past Surgical History:  Procedure Laterality Date   ABDOMINAL HYSTERECTOMY     ABDOMINAL SURGERY  1995   Bowel resection.   CARDIAC CATHETERIZATION N/A 04/14/2016   Procedure: Left Heart Cath and Coronary Angiography;  Surgeon: Burnell Blanks, MD;  Location: [facility];  Service: Cardiovascular;  Laterality: N/A;   CARDIAC SURGERY     COLONOSCOPY WITH PROPOFOL N/A 07/11/2019   Procedure: COLONOSCOPY WITH PROPOFOL;  Surgeon: Jonathon Bellows, MD;  Location:  [facility] END[facility]COPY;  Service: Gastroenterology;  Laterality: N/A;   COLONOSCOPY WITH PROPOFOL N/A 07/29/2019   Procedure: COLONOSCOPY WITH PROPOFOL;  Surgeon: Jonathon Bellows, MD;  Location: [facility] ENDOSCOPY;  Service: Gastroenterology;  Laterality: N/A;   CORONARY ANGIOPLASTY WITH STENT PLACEMENT  2010   Drug eluting stent   ESOPHAGOGASTRODUODENOSCOPY (EGD) WITH PROPOFOL N/A 07/11/2019   Procedure: ESOPHAGOGASTRODUODENOSCOPY (EGD) WITH PROPOFOL;  Surgeon: Jonathon Bellows, MD;  Location: [facility] END[facility]COPY;  Service: Gastroenterology;  Laterality: N/A;   IR 3D INDEPENDENT WKST  11/08/2021   IR ANGIO INTRA EXTRACRAN SEL COM CAROTID INNOMINATE BILAT MOD SED  11/08/2021   IR ANGIO VERTEBRAL SEL VERTEBRAL BILAT MOD SED  11/08/2021   IR RADIOLOGIST EVAL & MGMT  10/16/2021   IR US GUIDE VASC ACCESS RIGHT  11/08/2021   LEFT HEART CATH AND CORONARY ANGIOGRAPHY Left 09/11/2020   Procedure: LEFT HEART CATH AND CORONARY ANGIOGRAPHY;  Surgeon: Nelva Bush, MD;  Location: [place] [facility];  Service: Cardiovascular;  Laterality: Left;   OVARIAN CYST REMOVAL     RADIOLOGY WITH ANESTHESIA N/A 11/08/2021   Procedure: Cyril Loosen;  Surgeon: Luanne Bras, MD;  Location: [place];  Service: Radiology;  Laterality: N/A;    Allergies: Ativan [lorazepam], Augmentin [amoxicillin-pot clavulanate], Lactose intolerance (gi), Latex, Tape, Xifaxan [rifaximin], and Drixoral allergy sinus [dexbromphen-pse-apap er]  Medications: Prior to Admission medications   Medication Sig Start Date End Date Taking? Authorizing Provider  albuterol (PROAIR HFA) 108 (90 Base) MCG/ACT inhaler Inhale 1-2 puffs into the lungs every 4 (four) hours as needed for wheezing or shortness of breath. Patient taking differently: Inhale 1-2 puffs into the lungs 2 (two) times daily. 11/28/21  Yes McLean-Scocuzza, Nino Glow, MD  alum & mag hydroxide-simeth (MAALOX/MYLANTA) 200-200-20 MG/5ML suspension Take 30 mLs by mouth every 6 (six) hours as needed (Sulfa Burps).    Yes [provider]  amLODipine (NORVASC) 10 MG tablet TAKE 1 TABLET BY MOUTH AT  BEDTIME 04/19/21  Yes McLean-Scocuzza, Nino Glow, MD  aspirin 81 MG chewable tablet Chew 81 mg by mouth daily.   Yes [provider]  atorvastatin (LIPITOR) 80 MG tablet TAKE 1 TABLET BY MOUTH  DAILY AT 6 PM. 05/15/21  Yes McLean-Scocuzza, Nino Glow, MD  baclofen (LIORESAL) 10 MG tablet Take 10 mg by mouth 2 (two) times daily. 11/22/21 05/21/22 Yes [provider]  bismuth subsalicylate (PEPTO BISMOL) 262 MG/15ML suspension Take 30 mLs by mouth every 6 (six) hours as needed (Sulfa burps).   Yes [provider]  Budeson-Glycopyrrol-Formoterol (BREZTRI AEROSPHERE) 160-9-4.8 MCG/ACT AERO Inhale 2 puffs into the lungs in the morning and at bedtime. Rinse 06/13/21  Yes Tyler Pita, MD  clopidogrel (PLAVIX) 75 MG tablet TAKE 1 TABLET BY MOUTH  DAILY Patient taking differently: Take 37.5 mg by mouth daily. 05/01/21  Yes Crecencio Mc, MD  dicyclomine (BENTYL) 20 MG tablet Take 1 tablet (20 mg total) by mouth 4 (four) times daily -  before meals and at bedtime. Patient taking differently: Take 20 mg by mouth daily as needed (Vomiting). 09/12/21  Yes McLean-Scocuzza, Nino Glow, MD  divalproex (DEPAKOTE) 500 MG DR tablet  TAKE 1 TABLET BY MOUTH  TWICE DAILY 08/12/21  Yes McLean-Scocuzza, Nino Glow, MD  ezetimibe (ZETIA) 10 MG tablet TAKE 1 TABLET BY MOUTH  DAILY WITH LIPITOR 80 MG AT NIGHT 10/16/21  Yes McLean-Scocuzza, Nino Glow, MD  guaiFENesin (MUCINEX) 600 MG 12 hr tablet Take 600 mg by mouth daily.   Yes [provider]  Ipratropium-Albuterol (COMBIVENT RESPIMAT) 20-100 MCG/ACT AERS respimat Inhale 1 puff into the lungs every 6 (six) hours as needed (bronchitis).   Yes [provider]  isosorbide mononitrate (IMDUR) 30 MG 24 hr tablet Take 3 tablets (90 mg) by mouth once daily Patient taking differently: Take 90 mg by mouth daily. 04/30/21  Yes Furth, Cadence H, PA-C  methocarbamol  (ROBAXIN) 500 MG tablet Take 1 tablet (500 mg total) by mouth 2 (two) times daily as needed for muscle spasms. 02/07/21  Yes McLean-Scocuzza, Nino Glow, MD  metoCLOPramide (REGLAN) 5 MG tablet TAKE 1 TABLET BY MOUTH 4  TIMES DAILY BEFORE MEALS  AND AT BEDTIME Patient taking differently: Take 5 mg by mouth 4 (four) times daily as needed for nausea or vomiting. 06/10/21  Yes Vanga, Tally Due, MD  nitroGLYCERIN (NITROSTAT) 0.4 MG SL tablet DISSOLVE 1 TABLET UNDER  TONGUE EVERY 5 MINUTES AS  NEEDED FOR CHEST PAIN MAX  OF 3 TABLETS IN 15 MINUTES  . CALL 911 AFTER THIRD DOSE 10/16/21  Yes Loel Dubonnet, NP  olmesartan-hydrochlorothiazide (BENICAR HCT) 20-12.5 MG tablet Take 1 tablet by mouth daily. In am if BP >130/>80 take 2 pills to equal 40-25 mg qd 10/01/21  Yes McLean-Scocuzza, Nino Glow, MD  ondansetron (ZOFRAN-ODT) 4 MG disintegrating tablet DISSOLVE 1 TABLET ON TOP OF THE  TONGUE EVERY 8 HOURS AS NEEDED  FOR NAUEA OR VOMITING 11/21/21  Yes McLean-Scocuzza, Nino Glow, MD  pantoprazole (PROTONIX) 40 MG tablet TAKE 1 TABLET BY MOUTH  TWICE DAILY BEFORE MEALS 06/10/21  Yes Jonathon Bellows, MD  potassium chloride (MICRO-K) 10 MEQ CR capsule Take 2 capsules (20 mEq total) by mouth daily. 12/06/21  Yes McLean-Scocuzza, Nino Glow, MD  Probiotic Product (PROBIOTIC DAILY PO) Take 200 mg by mouth daily.   Yes [provider]  promethazine (PHENERGAN) 25 MG tablet TAKE 1 TABLET BY MOUTH TWICE  DAILY AS NEEDED FOR NAUSEA OR  VOMITING 11/26/21  Yes McLean-Scocuzza, Nino Glow, MD  REPATHA SURECLICK 607 MG/ML SOAJ INJECT '140MG'$  SUBCUTANEOUSLY  EVERY 2 WEEKS 10/14/21  Yes End, Harrell Gave, MD  topiramate (TOPAMAX) 50 MG tablet Take 50 mg by mouth at bedtime.   Yes [provider]  Acetylcysteine (NAC) 600 MG CAPS Take 1 capsule (600 mg total) by mouth 2 (two) times daily. prn Patient not taking: Reported on 12/20/2021 12/06/21   McLean-Scocuzza, Nino Glow, MD  HYDROcodone bit-homatropine (HYCODAN) 5-1.5 MG/5ML syrup Take  5 mLs by mouth at bedtime as needed for cough. 12/06/21   McLean-Scocuzza, Nino Glow, MD  ipratropium (ATROVENT) 0.06 % nasal spray USE 2 SPRAYS IN BOTH  NOSTRILS 3 TIMES DAILY Patient taking differently: Place 2 sprays into both nostrils 2 (two) times daily as needed (Congestion). 11/25/21   McLean-Scocuzza, Nino Glow, MD  ipratropium-albuterol (DUONEB) 0.5-2.5 (3) MG/3ML SOLN Take 3 mLs by nebulization every 6 (six) hours as needed. Patient taking differently: Take 3 mLs by nebulization every 6 (six) hours as needed (Shortness of breath & bronchitis). 11/28/21   McLean-Scocuzza, Nino Glow, MD  levocetirizine (XYZAL) 5 MG tablet Take 5 mg by mouth at bedtime as needed for allergies. 10/25/20  [provider]     Family History  Problem Relation Age of Onset   CAD Mother    Depression Mother    Heart disease Mother    Hyperlipidemia Mother    Hypertension Mother    Heart disease Father    Alcohol abuse Father    Cancer Brother 73       brain   CAD Brother    Depression Brother    Diabetes Brother    Heart disease Brother    Hyperlipidemia Brother    Lupus Other    Sickle cell anemia Other     Social History   Socioeconomic History   Marital status: Single    Spouse name: Not on file   Number of children: 1   Years of education: Not on file   Highest education level: Not on file  Occupational History   Not on file  Tobacco Use   Smoking status: Former    Packs/day: 1.00    Years: 30.00    Total pack years: 30.00    Types: Cigarettes    Quit date: 12/31/2020    Years since quitting: 0.9   Smokeless tobacco: Never   Tobacco comments:    Quit after being admitted on 6/20 01/17/2021  Vaping Use   Vaping Use: Former  Substance and Sexual Activity   Alcohol use: Yes    Comment: once a year   Drug use: Yes    Types: Marijuana    Comment: marijuana   Sexual activity: Not Currently  Other Topics Concern   Not on file  Social History Narrative   From [place] now  living in [place]    1 son    No guns    Wears seat belt   Safe in relationship    Social Determinants of Health   Financial Resource Strain: Low Risk  (10/16/2021)   Overall Financial Resource Strain (CARDIA)    Difficulty of Paying Living Expenses: Not very hard  Food Insecurity: No Food Insecurity (10/16/2021)   Hunger Vital Sign    Worried About Running Out of Food in the Last Year: Never true    Ran Out of Food in the Last Year: Never true  Transportation Needs: No Transportation Needs (10/16/2021)   PRAPARE - Hydrologist (Medical): No    Lack of Transportation (Non-Medical): No  Physical Activity: Not on file  Stress: No Stress Concern Present (10/16/2021)   [place]    Feeling of Stress : Not at all  Social Connections: Unknown (10/16/2021)   Social Connection and Isolation Panel [NHANES]    Frequency of Communication with Friends and Family: More than three times a week    Frequency of Social Gatherings with Friends and Family: More than three times a week    Attends Religious Services: Not on Advertising copywriter or Organizations: Not on file    Attends Archivist Meetings: Not on file    Marital Status: Not on file    Review of Systems: A 12 point ROS discussed and pertinent positives are indicated in the HPI above.  All other systems are negative.  Review of Systems  Constitutional:  Negative for activity change, fatigue, fever and unexpected weight change.  HENT:  Negative for sore throat, tinnitus and trouble swallowing.   Eyes:  Negative for visual disturbance.  Respiratory:  Negative for cough, shortness  of breath and wheezing.   Cardiovascular:  Negative for chest pain.  Gastrointestinal:  Negative for abdominal pain, nausea and vomiting.  Musculoskeletal:  Negative for back pain and gait problem.  Neurological:  Positive for headaches. Negative  for dizziness, tremors, seizures, syncope, facial asymmetry, speech difficulty, weakness, light-headedness and numbness.  Psychiatric/Behavioral:  Negative for behavioral problems and confusion.     Vital Signs: BP (!) 145/65   Pulse 65   Temp 97.8 F (36.6 C) (Oral)   Resp 18   Ht '5\' 3"'$  (1.6 m)   Wt 189 lb (85.7 kg)   SpO2 98%   BMI 33.48 kg/m   Physical Exam Vitals reviewed.  HENT:     Mouth/Throat:     Mouth: Mucous membranes are moist.  Eyes:     Extraocular Movements: Extraocular movements intact.  Cardiovascular:     Rate and Rhythm: Normal rate and regular rhythm.  Pulmonary:     Effort: Pulmonary effort is normal.     Breath sounds: Normal breath sounds. No wheezing.  Abdominal:     Palpations: Abdomen is soft.     Tenderness: There is no abdominal tenderness.  Musculoskeletal:        General: Normal range of motion.     Cervical back: Normal range of motion.  Neurological:     Mental Status: She is alert.     Imaging: DG Chest 2 View  Result Date: 11/28/2021 CLINICAL DATA:  Chronic bronchitis. EXAM: CHEST - 2 VIEW COMPARISON:  April 22, 2021. FINDINGS: The heart size and mediastinal contours are within normal limits. Both lungs are clear. The visualized skeletal structures are unremarkable. IMPRESSION: No active cardiopulmonary disease. Electronically Signed   By: Marijo Conception M.D.   On: 11/28/2021 09:16    Labs:  CBC: Recent Labs    08/27/21 1240 11/08/21 0657 11/25/21 0708 12/25/21 0641  WBC 9.3 9.4 12.4* 8.1  HGB 14.1 12.6 12.0 11.7*  HCT 42.4 38.6 35.6* 36.1  PLT 475* 383 420* 319    COAGS: Recent Labs    02/10/21 1720 02/11/21 0718 11/08/21 0657 11/25/21 0708 12/25/21 0641  INR 1.0 1.1 1.0 0.9 0.9  APTT 23*  --   --   --   --     BMP: Recent Labs    04/23/21 0432 04/24/21 0711 08/21/21 1002 09/26/21 1423 11/08/21 0657 11/25/21 0708  NA 138 138 139  --  135 137  K 3.6 3.8 4.6  --  3.6 3.0*  CL 100 99 101  --  105 107   CO2 '31 29 21  '$ --  23 21*  GLUCOSE 119* 108* 109*  --  112* 134*  BUN '9 9 19  '$ --  20 13  CALCIUM 8.8* 9.4 9.5  --  8.5* 8.9  CREATININE 0.78 0.66 0.76 1.00 0.84 0.79  GFRNONAA >60 >60  --   --  >60 >60    LIVER FUNCTION TESTS: Recent Labs    02/20/21 0420 04/22/21 1047 04/24/21 0711 08/21/21 1002  BILITOT 0.6 0.7 0.5 0.2  AST '26 27 19 '$ 41*  ALT 19 81* 43 58*  ALKPHOS 42 86 61 116  PROT 6.1* 7.1 6.2* 6.6  ALBUMIN 3.4* 4.1 3.5 4.6    TUMOR MARKERS: No results for input(s): "AFPTM", "CEA", "CA199", "CHROMGRNA" in the last 8760 hours.  Assessment and Plan:  Scheduled for cerebral arteriogram with possible Rt PCOM and Rt MCA aneurysm embolization Risks and benefits of cerebral angiogram with intervention were discussed  with the patient including, but not limited to bleeding, infection, vascular injury, contrast induced renal failure, stroke or even death.  This interventional procedure involves the use of X-rays and because of the nature of the planned procedure, it is possible that we will have prolonged use of X-ray fluoroscopy.  Potential radiation risks to you include (but are not limited to) the following: - A slightly elevated risk for cancer  several years later in life. This risk is typically less than 0.5% percent. This risk is low in comparison to the normal incidence of human cancer, which is 33% for women and 50% for men according to the [place]. - Radiation induced injury can include skin redness, resembling a rash, tissue breakdown / ulcers and hair loss (which can be temporary or permanent).   The likelihood of either of these occurring depends on the difficulty of the procedure and whether you are sensitive to radiation due to previous procedures, disease, or genetic conditions.   IF your procedure requires a prolonged use of radiation, you will be notified and given written instructions for further action.  It is your responsibility to monitor the  irradiated area for the 2 weeks following the procedure and to notify your physician if you are concerned that you have suffered a radiation induced injury.    All of the patient's questions were answered, patient is agreeable to proceed.  Consent signed and in chart.  Pt will be admitted to Neuro ICU after procedure for overnight observation Plan for discharge in am Pt is aware and agreeable    Thank you for this interesting consult.  I greatly enjoyed meeting [place] and look forward to participating in their care.  A copy of this report was sent to the requesting provider on this date.  Electronically Signed: Lavonia Drafts, PA-C 12/25/2021, 7:23 AM   I spent a total of    25 Minutes in face to face in clinical consultation, greater than 50% of which was counseling/coordinating care for right posterior communication artery and right middle cerebral artery aneurysm embolization

## 2021-12-25 NOTE — Progress Notes (Addendum)
ANTICOAGULATION CONSULT NOTE - Initial Consult  Pharmacy Consult for heparin  Indication:  angiogram with coiling  Allergies  Allergen Reactions   Ativan [Lorazepam]     Pt states it makes her tongue do weird things    Augmentin [Amoxicillin-Pot Clavulanate]     Upset stomach    Lactose Intolerance (Gi)     Gi upset   Latex Other (See Comments)    Patient stated that she was told by her doctor that she is "allergic to" this   Tape Other (See Comments)    Patient stated that she was told by her doctor that she is "allergic to" this   Xifaxan [Rifaximin]     Pt believes this is contributing to her abdominal pain/discomfort   Drixoral Allergy Sinus [Dexbromphen-Pse-Apap Er] Rash    Patient Measurements: Height: '5\' 3"'$  (160 cm) Weight: 85.7 kg (189 lb) IBW/kg (Calculated) : 52.4 Heparin Dosing Weight: 72kg  Vital Signs: Temp: 97 F (36.1 C) (06/14 1214) Temp Source: Oral (06/14 0646) BP: 124/58 (06/14 1545) Pulse Rate: 76 (06/14 1545)  Labs: Recent Labs    12/25/21 0641  HGB 11.7*  HCT 36.1  PLT 319  LABPROT 12.4  INR 0.9  CREATININE 0.79    Estimated Creatinine Clearance: 83.4 mL/min (by C-G formula based on SCr of 0.79 mg/dL).   Medical History: Past Medical History:  Diagnosis Date   Anxiety    Asthma    Bipolar 1 disorder ([place])    Bipolar disorder ([place])    CAD (coronary artery disease)    s/p stent BMS OM Cx   Cervical herniated disc 04/12/2016   COPD (chronic obstructive pulmonary disease) (HCC)    Depression    Diabetes mellitus with gastroparesis (HCC)    Diabetes mellitus without complication (HCC)    no meds, diet controlled   Diverticulitis    GERD (gastroesophageal reflux disease)    Glaucoma    History of blood transfusion    Hyperlipidemia    Hypertension    OSA (obstructive sleep apnea)    not using cpap    Plantar fasciitis    b/l feet s/p steroid shots w/o help and left surgery w/o help    PONV (postoperative nausea and vomiting)     Spinal headache    UTI (urinary tract infection)     Medications:  Medications Prior to Admission  Medication Sig Dispense Refill Last Dose   albuterol (PROAIR HFA) 108 (90 Base) MCG/ACT inhaler Inhale 1-2 puffs into the lungs every 4 (four) hours as needed for wheezing or shortness of breath. (Patient taking differently: Inhale 1-2 puffs into the lungs 2 (two) times daily.) 54 g 3 12/25/2021 at 0500   alum & mag hydroxide-simeth (MAALOX/MYLANTA) 200-200-20 MG/5ML suspension Take 30 mLs by mouth every 6 (six) hours as needed (Sulfa Burps).   12/24/2021   amLODipine (NORVASC) 10 MG tablet TAKE 1 TABLET BY MOUTH AT  BEDTIME 90 tablet 3 12/24/2021   aspirin 81 MG chewable tablet Chew 81 mg by mouth daily.   12/25/2021 at 0720   atorvastatin (LIPITOR) 80 MG tablet TAKE 1 TABLET BY MOUTH  DAILY AT 6 PM. 90 tablet 3 12/24/2021   baclofen (LIORESAL) 10 MG tablet Take 10 mg by mouth 2 (two) times daily.   1/61/0960   bismuth subsalicylate (PEPTO BISMOL) 262 MG/15ML suspension Take 30 mLs by mouth every 6 (six) hours as needed (Sulfa burps).   Past Week   Budeson-Glycopyrrol-Formoterol (BREZTRI AEROSPHERE) 160-9-4.8 MCG/ACT AERO Inhale 2 puffs  into the lungs in the morning and at bedtime. Rinse 32.1 g 3 12/24/2021   clopidogrel (PLAVIX) 75 MG tablet TAKE 1 TABLET BY MOUTH  DAILY (Patient taking differently: Take 37.5 mg by mouth daily.) 90 tablet 3 12/25/2021 at 0500   dicyclomine (BENTYL) 20 MG tablet Take 1 tablet (20 mg total) by mouth 4 (four) times daily -  before meals and at bedtime. (Patient taking differently: Take 20 mg by mouth daily as needed (Vomiting).) 120 tablet 2 Past Week   divalproex (DEPAKOTE) 500 MG DR tablet TAKE 1 TABLET BY MOUTH  TWICE DAILY 180 tablet 3 12/25/2021 at 0500   ezetimibe (ZETIA) 10 MG tablet TAKE 1 TABLET BY MOUTH  DAILY WITH LIPITOR 80 MG AT NIGHT 90 tablet 3 12/24/2021   guaiFENesin (MUCINEX) 600 MG 12 hr tablet Take 600 mg by mouth daily.   12/24/2021    Ipratropium-Albuterol (COMBIVENT RESPIMAT) 20-100 MCG/ACT AERS respimat Inhale 1 puff into the lungs every 6 (six) hours as needed (bronchitis).   12/25/2021 at 0500   isosorbide mononitrate (IMDUR) 30 MG 24 hr tablet Take 3 tablets (90 mg) by mouth once daily (Patient taking differently: Take 90 mg by mouth daily.) 90 tablet 6 12/25/2021 at 0500   methocarbamol (ROBAXIN) 500 MG tablet Take 1 tablet (500 mg total) by mouth 2 (two) times daily as needed for muscle spasms. 180 tablet 1 Past Month   metoCLOPramide (REGLAN) 5 MG tablet TAKE 1 TABLET BY MOUTH 4  TIMES DAILY BEFORE MEALS  AND AT BEDTIME (Patient taking differently: Take 5 mg by mouth 4 (four) times daily as needed for nausea or vomiting.) 360 tablet 2 Past Week   nitroGLYCERIN (NITROSTAT) 0.4 MG SL tablet DISSOLVE 1 TABLET UNDER  TONGUE EVERY 5 MINUTES AS  NEEDED FOR CHEST PAIN MAX  OF 3 TABLETS IN 15 MINUTES  . CALL 911 AFTER THIRD DOSE 25 tablet 4 Past Week   olmesartan-hydrochlorothiazide (BENICAR HCT) 20-12.5 MG tablet Take 1 tablet by mouth daily. In am if BP >130/>80 take 2 pills to equal 40-25 mg qd 180 tablet 3 12/24/2021   ondansetron (ZOFRAN-ODT) 4 MG disintegrating tablet DISSOLVE 1 TABLET ON TOP OF THE  TONGUE EVERY 8 HOURS AS NEEDED  FOR NAUEA OR VOMITING 270 tablet 1 Past Week   pantoprazole (PROTONIX) 40 MG tablet TAKE 1 TABLET BY MOUTH  TWICE DAILY BEFORE MEALS 180 tablet 3 12/25/2021 at 0500   potassium chloride (MICRO-K) 10 MEQ CR capsule Take 2 capsules (20 mEq total) by mouth daily. 180 capsule 3 12/25/2021 at 0500   Probiotic Product (PROBIOTIC DAILY PO) Take 200 mg by mouth daily.   12/25/2021 at 0500   promethazine (PHENERGAN) 25 MG tablet TAKE 1 TABLET BY MOUTH TWICE  DAILY AS NEEDED FOR NAUSEA OR  VOMITING 180 tablet 2 Past Week   REPATHA SURECLICK 665 MG/ML SOAJ INJECT '140MG'$  SUBCUTANEOUSLY  EVERY 2 WEEKS 6 mL 1 Past Week   topiramate (TOPAMAX) 50 MG tablet Take 50 mg by mouth at bedtime.   12/24/2021   Acetylcysteine (NAC)  600 MG CAPS Take 1 capsule (600 mg total) by mouth 2 (two) times daily. prn (Patient not taking: Reported on 12/20/2021) 180 capsule 3 Not Taking   HYDROcodone bit-homatropine (HYCODAN) 5-1.5 MG/5ML syrup Take 5 mLs by mouth at bedtime as needed for cough. 120 mL 0 Unknown   ipratropium (ATROVENT) 0.06 % nasal spray USE 2 SPRAYS IN BOTH  NOSTRILS 3 TIMES DAILY (Patient taking differently: Place 2 sprays into  both nostrils 2 (two) times daily as needed (Congestion).) 75 mL 4 Unknown   ipratropium-albuterol (DUONEB) 0.5-2.5 (3) MG/3ML SOLN Take 3 mLs by nebulization every 6 (six) hours as needed. (Patient taking differently: Take 3 mLs by nebulization every 6 (six) hours as needed (Shortness of breath & bronchitis).) 75 mL 0 Unknown   levocetirizine (XYZAL) 5 MG tablet Take 5 mg by mouth at bedtime as needed for allergies.   Unknown   Scheduled:   amLODipine  10 mg Oral QHS   aspirin  81 mg Oral Daily   aspirin EC  81 mg Oral Daily   atorvastatin  80 mg Oral Daily   baclofen  10 mg Oral BID   Budeson-Glycopyrrol-Formoterol  2 puff Inhalation BID   clopidogrel  37.5 mg Oral 60 min Pre-Op   clopidogrel  75 mg Oral Daily   divalproex  500 mg Oral BID   ezetimibe  10 mg Oral Daily   fentaNYL       fentaNYL       guaiFENesin  600 mg Oral Daily   isosorbide mononitrate  90 mg Oral Daily   niMODipine  0-60 mg Oral 60 min Pre-Op   olmesartan-hydrochlorothiazide  1 tablet Oral Daily   pantoprazole  40 mg Oral BID AC   scopolamine  1 patch Transdermal Q72H   topiramate  50 mg Oral QHS   Infusions:   sodium chloride     sodium chloride 300 mL/hr at 12/25/21 1131   acetaminophen     clevidipine     clevidipine 8 mg/hr (12/25/21 1215)   heparin     heparin 500 Units/hr (12/25/21 1300)    Assessment: Pt is s/p endovascular coiling. Heparin until 6/15 at 0800.   Goal of Therapy:  Heparin level 0.1-0.25 units/ml Monitor platelets by anticoagulation protocol: Yes   Plan:  Heparin 500  units/hr 6 hr HL Stop in AM at [place], PharmD, BCIDP, AAHIVP, CPP Infectious Disease Pharmacist 12/25/2021 4:57 PM

## 2021-12-25 NOTE — Progress Notes (Signed)
Patient seen in PACU following today's procedure in NIR. She underwent right vertebral artery and right common carotid arteriograms via right radial approach. She is now s/p endovascular primary coiling of a right posterior communicating artery region aneurysm.   She will be admitted to the Neuro ICU for overnight observation pending bed availability.   She is currently awake, alert and able to follow all commands. Right radial site has scant amount of blood under TR band. She is breathing comfortably on room air. She complains of generalized pain and pain related to the foley catheter. She wants to eat.   Ok to remove foley cathter. Ok to advance diet as tolerated starting with ice chips. Fentanyl 25 mcg Q6H PRN ordered. Toradol 30 mg Q8 hours PRN ordered.  Patient tentatively scheduled for discharge tomorrow morning. NIR will assess in the morning. Please call NIR with any questions.   Soyla Dryer, [place] [phone] 12/25/2021, 4:23 PM

## 2021-12-26 ENCOUNTER — Telehealth: Payer: Self-pay | Admitting: Pulmonary Disease

## 2021-12-26 ENCOUNTER — Encounter (HOSPITAL_COMMUNITY): Payer: Self-pay | Admitting: Interventional Radiology

## 2021-12-26 LAB — CBC WITH DIFFERENTIAL/PLATELET
Abs Immature Granulocytes: 0.05 10*3/uL (ref 0.00–0.07)
Basophils Absolute: 0 10*3/uL (ref 0.0–0.1)
Basophils Relative: 0 %
Eosinophils Absolute: 0.3 10*3/uL (ref 0.0–0.5)
Eosinophils Relative: 3 %
HCT: 32.3 % — ABNORMAL LOW (ref 36.0–46.0)
Hemoglobin: 10.4 g/dL — ABNORMAL LOW (ref 12.0–15.0)
Immature Granulocytes: 1 %
Lymphocytes Relative: 39 %
Lymphs Abs: 3.2 10*3/uL (ref 0.7–4.0)
MCH: 29.2 pg (ref 26.0–34.0)
MCHC: 32.2 g/dL (ref 30.0–36.0)
MCV: 90.7 fL (ref 80.0–100.0)
Monocytes Absolute: 0.7 10*3/uL (ref 0.1–1.0)
Monocytes Relative: 8 %
Neutro Abs: 3.9 10*3/uL (ref 1.7–7.7)
Neutrophils Relative %: 49 %
Platelets: 301 10*3/uL (ref 150–400)
RBC: 3.56 MIL/uL — ABNORMAL LOW (ref 3.87–5.11)
RDW: 13.9 % (ref 11.5–15.5)
WBC: 8.2 10*3/uL (ref 4.0–10.5)
nRBC: 0 % (ref 0.0–0.2)

## 2021-12-26 LAB — BASIC METABOLIC PANEL
Anion gap: 9 (ref 5–15)
BUN: 11 mg/dL (ref 6–20)
CO2: 24 mmol/L (ref 22–32)
Calcium: 7.9 mg/dL — ABNORMAL LOW (ref 8.9–10.3)
Chloride: 108 mmol/L (ref 98–111)
Creatinine, Ser: 0.78 mg/dL (ref 0.44–1.00)
GFR, Estimated: >60 mL/min (ref >60)
Glucose, Bld: 124 mg/dL — ABNORMAL HIGH (ref 70–99)
Potassium: 3.4 mmol/L — ABNORMAL LOW (ref 3.5–5.1)
Sodium: 141 mmol/L (ref 135–145)

## 2021-12-26 LAB — GLUCOSE, CAPILLARY: Glucose-Capillary: 145 mg/dL — ABNORMAL HIGH (ref 70–99)

## 2021-12-26 MED ORDER — FENTANYL CITRATE (PF) 100 MCG/2ML IJ SOLN: 25.0000 ug | Freq: Four times a day (QID) | INTRAMUSCULAR | Status: DC | PRN

## 2021-12-26 MED ORDER — FENTANYL CITRATE PF 50 MCG/ML IJ SOSY
25.0000 ug | PREFILLED_SYRINGE | Freq: Four times a day (QID) | INTRAMUSCULAR | Status: DC | PRN
  Administered 2021-12-26 (×2): 25 ug via INTRAVENOUS
  Filled 2021-12-26 (×2): qty 1

## 2021-12-26 MED ORDER — SODIUM CHLORIDE 0.9 % IV BOLUS
250.0000 mL | Freq: Once | INTRAVENOUS | Status: AC
Start: 2021-12-26 — End: 2021-12-26
  Administered 2021-12-26: 250 mL via INTRAVENOUS

## 2021-12-26 NOTE — Progress Notes (Signed)
   12/26/21 0953  AVS Discharge Documentation  AVS Discharge Instructions Including Medications Provided to patient/caregiver  Name of Person Receiving AVS Discharge Instructions Including Medications [facility]  Name of Clinician That Reviewed AVS Discharge Instructions Including Medications Trudee Grip, RN   Pt voices no further questions after discharge education. All belongings sent home with patient.

## 2021-12-26 NOTE — Plan of Care (Signed)

## 2021-12-26 NOTE — Telephone Encounter (Signed)
Spoke to patient. She stated that she had a brain aneurysm coiled yesterday and her oxygen dropped in the mild 80's while reversing anesthesia. Patient has hx of OSA but does not wear a cpap.  She is scheduled for another procedure in 3 weeks and would like to follow up with our office prior. Pt is willing to travel to [facility] for an appt.  Appt scheduled 01/03/2022 at 11:00 with Beth. Address provided.   Routing to [employer] as an Pharmacist, hospital.

## 2021-12-26 NOTE — Progress Notes (Signed)
250 cc NS bolus admin per Dr. Estanislado Pandy for hypotension.

## 2021-12-26 NOTE — Progress Notes (Addendum)
New V.O. per Dr. Estanislado Pandy, "SBP goal 140-150".

## 2021-12-26 NOTE — Discharge Summary (Addendum)
   Patient ID: Shawna Anderson MRN: [id number] DOB/AGE: 55/15/1967 56 y.o.  Admit date: 12/25/2021 Discharge date: 12/26/2021  Supervising Physician: Luanne Bras  Patient Status: [facility] - In-pt  Admission Diagnoses: Cerebral aneurysm  Discharge Diagnoses:  Principal Problem:   Status post coil embolization of cerebral aneurysm Active Problems:   Brain aneurysm   Discharged Condition: good  Hospital Course:  Shawna Anderson is a 56 y.o. female with worsening headaches for 2-3 months  Work up revealed 2 intracranial aneurysms. One in the right PCOM region and one in the right middle cerebral artery bifurcation region. The PCOM artery aneurysm measures approximately 4.25 x 3 mm, and right MCA trifurcation aneurysm measures approximately 3.1 mm x 3 mm.  She underwent treatment of the PCOM aneurysm yesterday.  Upon reversal of anesthesia, her O2 Sat was in the mid 80's and took a while to come up. She does have OSA and is supposed to wear a CPAP but she refuses stating she "doesn't want to get dependent on it".  She otherwise has done well. She has been hypertensive. She is currently refusing her home medications and states she will take them when she gets home because she doesn't want "to be charged for them here".  She c/o of left neck pain and is convinced there was a central line place there. There was no lines placed in the left neck. She then c/o left shoulder pain. She states "we did something" to her shoulder. Dr. Estanislado Pandy reassured patient that nothing was done to her shoulder and that no central lines were placed.   She requested pain medications for her shoulder. Dr. Estanislado Pandy explained she could use Motrin (Ibuprofen) or topical patches like Salonpas or Lidocaine or Icy Hot patches.  She is ready for discharge.  Treatments:  Status post endovascular primary coiling of: Right posterior communicating artery region aneurysm   Discharge Exam: Blood pressure  (!) 148/69, pulse 80, temperature 97.6 F (36.4 C), temperature source Oral, resp. rate 19, height '5\' 3"'$  (1.6 m), weight 189 lb (85.7 kg), SpO2 99 %. Alert, awake, and oriented x3. Speech and comprehension intact. PERRL bilaterally. EOMs intact bilaterally without nystagmus or subjective diplopia. Visual fields intact bilaterally. No facial asymmetry. Tongue midline. Motor power symmetric proportional to effort. No pronator drift. Fine motor and coordination intact and symmetric. 5/5 Strength bilaterally Radial artery puncture site looks good, no bleeding, no hematoma, no pseudoaneurysm   Disposition: Discharge disposition: 01-Home or Self Care       Discharge Instructions     Diet general   Complete by: As directed    Driving Restrictions   Complete by: As directed    Can Drive short distances in 2 weeks   Increase activity slowly   Complete by: As directed    Lifting restrictions   Complete by: As directed    No lifting, bending, stooping, pushing, pulling x 2 weeks      Discharge to Home today.  Will plan intervention on the right MCA aneurysm. Our schedulers will call patient with details.  Patient will continue 1/2 tablet of Plavix daily x 1 week then stop.  She will restart Plavix, 1 tablet daily     Electronically Signed: Murrell Redden, PA-C 12/26/2021, 9:45 AM     I have spent Greater Than 30 Minutes discharging Shawna Anderson.

## 2021-12-27 ENCOUNTER — Encounter: Payer: Self-pay | Admitting: Internal Medicine

## 2021-12-27 ENCOUNTER — Ambulatory Visit (INDEPENDENT_AMBULATORY_CARE_PROVIDER_SITE_OTHER): Payer: Medicare Other

## 2021-12-27 ENCOUNTER — Ambulatory Visit (INDEPENDENT_AMBULATORY_CARE_PROVIDER_SITE_OTHER): Payer: Medicare Other | Admitting: Internal Medicine

## 2021-12-27 VITALS — BP 120/70 | HR 73 | Temp 98.2°F | Resp 14 | Ht 63.0 in | Wt 195.6 lb

## 2021-12-27 DIAGNOSIS — M25512 Pain in left shoulder: Secondary | ICD-10-CM

## 2021-12-27 DIAGNOSIS — M549 Dorsalgia, unspecified: Secondary | ICD-10-CM

## 2021-12-27 DIAGNOSIS — G4733 Obstructive sleep apnea (adult) (pediatric): Secondary | ICD-10-CM

## 2021-12-27 DIAGNOSIS — Z1231 Encounter for screening mammogram for malignant neoplasm of breast: Secondary | ICD-10-CM | POA: Diagnosis not present

## 2021-12-27 DIAGNOSIS — M79672 Pain in left foot: Secondary | ICD-10-CM

## 2021-12-27 DIAGNOSIS — R0902 Hypoxemia: Secondary | ICD-10-CM

## 2021-12-27 DIAGNOSIS — I671 Cerebral aneurysm, nonruptured: Secondary | ICD-10-CM | POA: Diagnosis not present

## 2021-12-27 DIAGNOSIS — G8929 Other chronic pain: Secondary | ICD-10-CM

## 2021-12-27 DIAGNOSIS — M79671 Pain in right foot: Secondary | ICD-10-CM

## 2021-12-27 DIAGNOSIS — G4734 Idiopathic sleep related nonobstructive alveolar hypoventilation: Secondary | ICD-10-CM

## 2021-12-27 DIAGNOSIS — M7551 Bursitis of right shoulder: Secondary | ICD-10-CM

## 2021-12-27 DIAGNOSIS — M25712 Osteophyte, left shoulder: Secondary | ICD-10-CM | POA: Diagnosis not present

## 2021-12-27 DIAGNOSIS — R208 Other disturbances of skin sensation: Secondary | ICD-10-CM | POA: Diagnosis not present

## 2021-12-27 DIAGNOSIS — M722 Plantar fascial fibromatosis: Secondary | ICD-10-CM

## 2021-12-27 DIAGNOSIS — R109 Unspecified abdominal pain: Secondary | ICD-10-CM

## 2021-12-27 DIAGNOSIS — M19012 Primary osteoarthritis, left shoulder: Secondary | ICD-10-CM | POA: Diagnosis not present

## 2021-12-27 DIAGNOSIS — M79673 Pain in unspecified foot: Secondary | ICD-10-CM | POA: Diagnosis not present

## 2021-12-27 MED ORDER — SALINE SPRAY 0.65 % NA SOLN: 2.0000 | Freq: Every day | NASAL | 11 refills | Status: AC | PRN

## 2021-12-27 MED ORDER — TRAMADOL HCL 50 MG PO TABS: 50.0000 mg | ORAL_TABLET | Freq: Two times a day (BID) | ORAL | 0 refills | Status: AC | PRN

## 2021-12-27 MED ORDER — FLUTICASONE PROPIONATE 50 MCG/ACT NA SUSP: 2.0000 | Freq: Every day | NASAL | 2 refills | Status: AC | PRN

## 2021-12-27 NOTE — Patient Instructions (Addendum)
Dr Michell Heinrich [facility] [phone] [place]  Voltaren gel or aspercream with lidocaine   Inspire procedure for sleep apnea  Talk to GI about sulfur burps   Mammogram due 03/01/22 schedule in [place]   Will plan intervention on the right MCA aneurysm. Our schedulers will call patient with details. Patient will continue 1/2 tablet of Plavix daily x 1 week then stop.  She will restart Plavix, 1 tablet daily  Will clarify   Plantar Fasciitis Rehab Ask your health care provider which exercises are safe for you. Do exercises exactly as told by your health care provider and adjust them as directed. It is normal to feel mild stretching, pulling, tightness, or discomfort as you do these exercises. Stop right away if you feel sudden pain or your pain gets worse. Do not begin these exercises until told by your health care provider. Stretching and range-of-motion exercises These exercises warm up your muscles and joints and improve the movement and flexibility of your foot. These exercises also help to relieve pain. Plantar fascia stretch  Sit with your left / right leg crossed over your opposite knee. Hold your heel with one hand with that thumb near your arch. With your other hand, hold your toes and gently pull them back toward the top of your foot. You should feel a stretch on the base (bottom) of your toes, or the bottom of your foot (plantar fascia), or both. Hold this stretch for__________ seconds. Slowly release your toes and return to the starting position. Repeat __________ times. Complete this exercise __________ times a day. Gastrocnemius stretch, standing This exercise is also called a calf (gastroc) stretch. It stretches the muscles in the back of the upper calf. Stand with your hands against a wall. Extend your left / right leg behind you, and bend your front knee slightly. Keeping your heels on the floor, your toes facing forward, and your back knee straight,  shift your weight toward the wall. Do not arch your back. You should feel a gentle stretch in your upper calf. Hold this position for __________ seconds. Repeat __________ times. Complete this exercise __________ times a day. Soleus stretch, standing This exercise is also called a calf (soleus) stretch. It stretches the muscles in the back of the lower calf. Stand with your hands against a wall. Extend your left / right leg behind you, and bend your front knee slightly. Keeping your heels on the floor and your toes facing forward, bend your back knee and shift your weight slightly over your back leg. You should feel a gentle stretch deep in your lower calf. Hold this position for __________ seconds. Repeat __________ times. Complete this exercise __________ times a day. Gastroc and soleus stretch, standing step This exercise stretches the muscles in the back of the lower leg. These muscles are in the upper calf (gastrocnemius) and the lower calf (soleus). Stand with the ball of your left / right foot on the front of a step. The ball of your foot is on the walking surface, right under your toes. Keep your other foot firmly on the same step. Hold on to the wall or a railing for balance. Slowly lift your other foot, allowing your body weight to press your heel down over the edge of the front of the step. Keep knee straight and unbent. You should feel a stretch in your calf. Hold this position for __________ seconds. Return both feet to the step. Repeat this exercise with a slight bend  in your left / right knee. Repeat __________ times with your left / right knee straight and __________ times with your left / right knee bent. Complete this exercise __________ times a day. Balance exercise This exercise builds your balance and strength control of your arch to help take pressure off your plantar fascia. Single leg stand If this exercise is too easy, you can try it with your eyes closed or while  standing on a pillow. Without shoes, stand near a railing or in a doorway. You may hold on to the railing or door frame as needed. Stand on your left / right foot. Keep your big toe down on the floor and lift the arch of your foot. You should feel a stretch across the bottom of your foot and your arch. Do not let your foot roll inward. Hold this position for __________ seconds. Repeat __________ times. Complete this exercise __________ times a day. This information is not intended to replace advice given to you by your health care provider. Make sure you discuss any questions you have with your health care provider. Document Revised: 04/12/2020 Document Reviewed: 04/12/2020 Elsevier Patient Education  [place].  Plantar Fasciitis  Plantar fasciitis is a painful foot condition that affects the heel. It occurs when the band of tissue that connects the toes to the heel bone (plantar fascia) becomes irritated. This can happen as the result of exercising too much or doing other repetitive activities (overuse injury). Plantar fasciitis can cause mild irritation to severe pain that makes it difficult to walk or move. The pain is usually worse in the morning after sleeping, or after sitting or lying down for a period of time. Pain may also be worse after long periods of walking or standing. What are the causes? This condition may be caused by: Standing for long periods of time. Wearing shoes that do not have good arch support. Doing activities that put stress on joints (high-impact activities). This includes ballet and exercise that makes your heart beat faster (aerobic exercise), such as running. Being overweight. An abnormal way of walking (gait). Tight muscles in the back of your lower leg (calf). High arches in your feet or flat feet. Starting a new athletic activity. What are the signs or symptoms? The main symptom of this condition is heel pain. Pain may get worse after the  following: Taking the first steps after a time of rest, especially in the morning after awakening, or after you have been sitting or lying down for a while. Long periods of standing still. Pain may decrease after 30-45 minutes of activity, such as gentle walking. How is this diagnosed? This condition may be diagnosed based on your medical history, a physical exam, and your symptoms. Your health care provider will check for: A tender area on the bottom of your foot. A high arch in your foot or flat feet. Pain when you move your foot. Difficulty moving your foot. You may have imaging tests to confirm the diagnosis, such as: X-rays. Ultrasound. MRI. How is this treated? Treatment for plantar fasciitis depends on how severe your condition is. Treatment may include: Rest, ice, pressure (compression), and raising (elevating) the affected foot. This is called RICE therapy. Your health care provider may recommend RICE therapy along with over-the-counter pain medicines to manage your pain. Exercises to stretch your calves and your plantar fascia. A splint that holds your foot in a stretched, upward position while you sleep (night splint). Physical therapy to relieve symptoms and  prevent problems in the future. Injections of steroid medicine (cortisone) to relieve pain and inflammation. Stimulating your plantar fascia with electrical impulses (extracorporeal shock wave therapy). This is usually the last treatment option before surgery. Surgery, if other treatments have not worked after 12 months. Follow these instructions at home: Managing pain, stiffness, and swelling  If directed, put ice on the painful area. To do this: Put ice in a plastic bag, or use a frozen bottle of water. Place a towel between your skin and the bag or bottle. Roll the bottom of your foot over the bag or bottle. Do this for 20 minutes, 2-3 times a day. Wear athletic shoes that have air-sole or gel-sole cushions, or try  soft shoe inserts that are designed for plantar fasciitis. Elevate your foot above the level of your heart while you are sitting or lying down. Activity Avoid activities that cause pain. Ask your health care provider what activities are safe for you. Do physical therapy exercises and stretches as told by your health care provider. Try activities and forms of exercise that are easier on your joints (low impact). Examples include swimming, water aerobics, and biking. General instructions Take over-the-counter and prescription medicines only as told by your health care provider. Wear a night splint while sleeping, if told by your health care provider. Loosen the splint if your toes tingle, become numb, or turn cold and blue. Maintain a healthy weight, or work with your health care provider to lose weight as needed. Keep all follow-up visits. This is important. Contact a health care provider if you have: Symptoms that do not go away with home treatment. Pain that gets worse. Pain that affects your ability to move or do daily activities. Summary Plantar fasciitis is a painful foot condition that affects the heel. It occurs when the band of tissue that connects the toes to the heel bone (plantar fascia) becomes irritated. Heel pain is the main symptom of this condition. It may get worse after exercising too much or standing still for a long time. Treatment varies, but it usually starts with rest, ice, pressure (compression), and raising (elevating) the affected foot. This is called RICE therapy. Over-the-counter medicines can also be used to manage pain. This information is not intended to replace advice given to you by your health care provider. Make sure you discuss any questions you have with your health care provider. Document Revised: 10/17/2019 Document Reviewed: 10/17/2019 Elsevier Patient Education  [place].

## 2021-12-27 NOTE — Progress Notes (Signed)
S/w pt and informed her she can take 10 mg total of reglan as directed per instructions until she sees GI on 01/2022. Pt verbalized understanding.

## 2021-12-27 NOTE — Progress Notes (Signed)
Chief Complaint  Patient presents with   Follow-up    Surgery from 12/25/21, pt c/o nasal drainage and burning since removing nasal cannula, disc about O2 it dropped down to <80 during surgery. Pt c/o L arm pain since surgery    HFU 1. 12/25/21 hosp. For repair of PCOM aneurysms at   Status post endovascular primary coiling of: Right posterior communicating artery region aneurysm Work up revealed 2 intracranial aneurysms. One in the right PCOM region and one in the right middle cerebral artery bifurcation region. The PCOM artery aneurysm measures approximately 4.25 x 3 mm, and right MCA trifurcation aneurysm measures approximately 3.1 mm x 3 mm.   During reversal of anesthesia, her O2 Sat was in the mid 80's and took a while to come up. She does have OSA and is supposed to wear a CPAP but she refuses stating she "doesn't want to get dependent on it". She has appt with [facility] to address this 01/03/22.    2. After #1 c/o left shoulder pain h/o right shoulder bursitis will refer [place]o in [place] 8/10 today nothing tried they rec otc meds 12/25/21  3. Chronic pain she wants referral to pain clinic [place] now  4. H/o plantar fascitis cannot walk w/o pain or stand to cook and now having mid foot pain  [place]iatry in [place]  5. After #1 c/o nose draining/burning since O2 taken out of her nose     Review of Systems  Constitutional:  Negative for weight loss.  HENT:  Negative for hearing loss.        +nasal drainage and burning  Eyes:  Negative for blurred vision.  Respiratory:  Negative for shortness of breath.   Cardiovascular:  Negative for chest pain.  Gastrointestinal:  Negative for abdominal pain and blood in stool.  Genitourinary:  Negative for dysuria.  Musculoskeletal:  Positive for joint pain. Negative for falls.  Skin:  Negative for rash.  Neurological:  Negative for headaches.  Psychiatric/Behavioral:  Negative for depression.     Past Medical History:  Diagnosis Date   Anxiety    Asthma    Bipolar 1 disorder ([place])    Bipolar disorder ([place])    CAD (coronary artery disease)    s/p stent BMS OM Cx   Cervical herniated disc 04/12/2016   COPD (chronic obstructive pulmonary disease) (HCC)    Depression    Diabetes mellitus with gastroparesis (HCC)    Diabetes mellitus without complication (HCC)    no meds, diet controlled   Diverticulitis    GERD (gastroesophageal reflux disease)    Glaucoma    History of blood transfusion    Hyperlipidemia    Hypertension    OSA (obstructive sleep apnea)    not using cpap    Plantar fasciitis    b/l feet s/p steroid shots w/o help and left surgery w/o help    PONV (postoperative nausea and vomiting)    Spinal headache    UTI (urinary tract infection)    Past Surgical History:  Procedure Laterality Date   ABDOMINAL HYSTERECTOMY     ABDOMINAL SURGERY  1995   Bowel resection.   CARDIAC CATHETERIZATION N/A 04/14/2016   Procedure: Left Heart Cath and Coronary Angiography;  Surgeon: Burnell Blanks, MD;  Location: [facility];  Service: Cardiovascular;  Laterality: N/A;   CARDIAC SURGERY     COLONOSCOPY WITH PROPOFOL N/A 07/11/2019   Procedure:  COLONOSCOPY WITH PROPOFOL;  Surgeon: Jonathon Bellows, MD;  Location: [facility] ENDOSCOPY;  Service: Gastroenterology;  Laterality: N/A;   COLONOSCOPY WITH PROPOFOL N/A 07/29/2019   Procedure: COLONOSCOPY WITH PROPOFOL;  Surgeon: Jonathon Bellows, MD;  Location: [facility] ENDOSCOPY;  Service: Gastroenterology;  Laterality: N/A;   CORONARY ANGIOPLASTY WITH STENT PLACEMENT  2010   Drug eluting stent   ESOPHAGOGASTRODUODENOSCOPY (EGD) WITH PROPOFOL N/A 07/11/2019   Procedure: ESOPHAGOGASTRODUODENOSCOPY (EGD) WITH PROPOFOL;  Surgeon: Jonathon Bellows, MD;  Location: [facility] ENDOSCOPY;  Service: Gastroenterology;  Laterality: N/A;   IR 3D INDEPENDENT WKST  11/08/2021   IR 3D INDEPENDENT WKST  12/25/2021   IR ANGIO INTRA EXTRACRAN SEL COM CAROTID  INNOMINATE BILAT MOD SED  11/08/2021   IR ANGIO INTRA EXTRACRAN SEL COM CAROTID INNOMINATE UNI R MOD SED  12/25/2021   IR ANGIO VERTEBRAL SEL VERTEBRAL BILAT MOD SED  11/08/2021   IR ANGIOGRAM FOLLOW UP STUDY  12/25/2021   IR ANGIOGRAM FOLLOW UP STUDY  12/25/2021   IR ANGIOGRAM FOLLOW UP STUDY  12/25/2021   IR NEURO EACH ADD'L AFTER BASIC UNI RIGHT (MS)  12/25/2021   IR RADIOLOGIST EVAL & MGMT  10/16/2021   IR TRANSCATH/EMBOLIZ  12/25/2021   IR US GUIDE VASC ACCESS RIGHT  11/08/2021   IR US GUIDE VASC ACCESS RIGHT  12/25/2021   LEFT HEART CATH AND CORONARY ANGIOGRAPHY Left 09/11/2020   Procedure: LEFT HEART CATH AND CORONARY ANGIOGRAPHY;  Surgeon: Nelva Bush, MD;  Location: [place];  Service: Cardiovascular;  Laterality: Left;   OVARIAN CYST REMOVAL     RADIOLOGY WITH ANESTHESIA N/A 11/08/2021   Procedure: Cyril Loosen;  Surgeon: Luanne Bras, MD;  Location: [place];  Service: Radiology;  Laterality: N/A;   RADIOLOGY WITH ANESTHESIA N/A 12/25/2021   Procedure: EMBOLIZATION;  Surgeon: Luanne Bras, MD;  Location: [place];  Service: Radiology;  Laterality: N/A;   Family History  Problem Relation Age of Onset   CAD Mother    Depression Mother    Heart disease Mother    Hyperlipidemia Mother    Hypertension Mother    Heart disease Father    Alcohol abuse Father    Cancer Brother 71       brain   CAD Brother    Depression Brother    Diabetes Brother    Heart disease Brother    Hyperlipidemia Brother    Lupus Other    Sickle cell anemia Other    Social History   Socioeconomic History   Marital status: Single    Spouse name: Not on file   Number of children: 1   Years of education: Not on file   Highest education level: Not on file  Occupational History   Not on file  Tobacco Use   Smoking status: Former    Packs/day: 1.00    Years: 30.00    Total pack years: 30.00    Types: Cigarettes    Quit date: 12/31/2020    Years since quitting: 0.9   Smokeless tobacco:  Never   Tobacco comments:    Quit after being admitted on 6/20 01/17/2021  Vaping Use   Vaping Use: Former  Substance and Sexual Activity   Alcohol use: Yes    Comment: once a year   Drug use: Yes    Types: Marijuana    Comment: marijuana   Sexual activity: Not Currently  Other Topics Concern   Not on file  Social History Narrative   From [place] now living in  [place]    1 son    No guns    Wears seat belt   Safe in relationship    Social Determinants of Health   Financial Resource Strain: Low Risk  (10/16/2021)   Overall Financial Resource Strain (CARDIA)    Difficulty of Paying Living Expenses: Not very hard  Food Insecurity: No Food Insecurity (10/16/2021)   Hunger Vital Sign    Worried About Running Out of Food in the Last Year: Never true    Ran Out of Food in the Last Year: Never true  Transportation Needs: No Transportation Needs (10/16/2021)   PRAPARE - Hydrologist (Medical): No    Lack of Transportation (Non-Medical): No  Physical Activity: Not on file  Stress: No Stress Concern Present (10/16/2021)   [place]    Feeling of Stress : Not at all  Social Connections: Unknown (10/16/2021)   Social Connection and Isolation Panel [NHANES]    Frequency of Communication with Friends and Family: More than three times a week    Frequency of Social Gatherings with Friends and Family: More than three times a week    Attends Religious Services: Not on file    Active Member of Clubs or Organizations: Not on file    Attends Archivist Meetings: Not on file    Marital Status: Not on file  Intimate Partner Violence: Not At Risk (10/16/2021)   Humiliation, Afraid, Rape, and Kick questionnaire    Fear of Current or Ex-Partner: No    Emotionally Abused: No    Physically Abused: No    Sexually Abused: No   Current Meds  Medication Sig   Acetylcysteine (NAC) 600 MG CAPS  Take 1 capsule (600 mg total) by mouth 2 (two) times daily. prn   albuterol (PROAIR HFA) 108 (90 Base) MCG/ACT inhaler Inhale 1-2 puffs into the lungs every 4 (four) hours as needed for wheezing or shortness of breath. (Patient taking differently: Inhale 1-2 puffs into the lungs 2 (two) times daily.)   alum & mag hydroxide-simeth (MAALOX/MYLANTA) 200-200-20 MG/5ML suspension Take 30 mLs by mouth every 6 (six) hours as needed (Sulfa Burps).   amLODipine (NORVASC) 10 MG tablet TAKE 1 TABLET BY MOUTH AT  BEDTIME   aspirin 81 MG chewable tablet Chew 81 mg by mouth daily.   atorvastatin (LIPITOR) 80 MG tablet TAKE 1 TABLET BY MOUTH  DAILY AT 6 PM.   baclofen (LIORESAL) 10 MG tablet Take 10 mg by mouth 2 (two) times daily.   bismuth subsalicylate (PEPTO BISMOL) 262 MG/15ML suspension Take 30 mLs by mouth every 6 (six) hours as needed (Sulfa burps).   Budeson-Glycopyrrol-Formoterol (BREZTRI AEROSPHERE) 160-9-4.8 MCG/ACT AERO Inhale 2 puffs into the lungs in the morning and at bedtime. Rinse   clopidogrel (PLAVIX) 75 MG tablet TAKE 1 TABLET BY MOUTH  DAILY (Patient taking differently: Take 37.5 mg by mouth daily.)   dicyclomine (BENTYL) 20 MG tablet Take 1 tablet (20 mg total) by mouth 4 (four) times daily -  before meals and at bedtime. (Patient taking differently: Take 20 mg by mouth daily as needed (Vomiting).)   divalproex (DEPAKOTE) 500 MG DR tablet TAKE 1 TABLET BY MOUTH  TWICE DAILY   ezetimibe (ZETIA) 10 MG tablet TAKE 1 TABLET BY MOUTH  DAILY WITH LIPITOR 80 MG AT NIGHT   fluticasone (FLONASE) 50 MCG/ACT nasal spray Place 2 sprays into both nostrils daily as needed for allergies  or rhinitis.   guaiFENesin (MUCINEX) 600 MG 12 hr tablet Take 600 mg by mouth daily.   HYDROcodone bit-homatropine (HYCODAN) 5-1.5 MG/5ML syrup Take 5 mLs by mouth at bedtime as needed for cough.   ipratropium (ATROVENT) 0.06 % nasal spray USE 2 SPRAYS IN BOTH  NOSTRILS 3 TIMES DAILY (Patient taking differently: Place 2  sprays into both nostrils 2 (two) times daily as needed (Congestion).)   Ipratropium-Albuterol (COMBIVENT RESPIMAT) 20-100 MCG/ACT AERS respimat Inhale 1 puff into the lungs every 6 (six) hours as needed (bronchitis).   ipratropium-albuterol (DUONEB) 0.5-2.5 (3) MG/3ML SOLN Take 3 mLs by nebulization every 6 (six) hours as needed. (Patient taking differently: Take 3 mLs by nebulization every 6 (six) hours as needed (Shortness of breath & bronchitis).)   isosorbide mononitrate (IMDUR) 30 MG 24 hr tablet Take 3 tablets (90 mg) by mouth once daily (Patient taking differently: Take 90 mg by mouth daily.)   levocetirizine (XYZAL) 5 MG tablet Take 5 mg by mouth at bedtime as needed for allergies.   methocarbamol (ROBAXIN) 500 MG tablet Take 1 tablet (500 mg total) by mouth 2 (two) times daily as needed for muscle spasms.   metoCLOPramide (REGLAN) 5 MG tablet TAKE 1 TABLET BY MOUTH 4  TIMES DAILY BEFORE MEALS  AND AT BEDTIME (Patient taking differently: Take 5 mg by mouth 4 (four) times daily as needed for nausea or vomiting.)   nitroGLYCERIN (NITROSTAT) 0.4 MG SL tablet DISSOLVE 1 TABLET UNDER  TONGUE EVERY 5 MINUTES AS  NEEDED FOR CHEST PAIN MAX  OF 3 TABLETS IN 15 MINUTES  . CALL 911 AFTER THIRD DOSE   olmesartan-hydrochlorothiazide (BENICAR HCT) 20-12.5 MG tablet Take 1 tablet by mouth daily. In am if BP >130/>80 take 2 pills to equal 40-25 mg qd   ondansetron (ZOFRAN-ODT) 4 MG disintegrating tablet DISSOLVE 1 TABLET ON TOP OF THE  TONGUE EVERY 8 HOURS AS NEEDED  FOR NAUEA OR VOMITING   pantoprazole (PROTONIX) 40 MG tablet TAKE 1 TABLET BY MOUTH  TWICE DAILY BEFORE MEALS   potassium chloride (MICRO-K) 10 MEQ CR capsule Take 2 capsules (20 mEq total) by mouth daily.   Probiotic Product (PROBIOTIC DAILY PO) Take 200 mg by mouth daily.   promethazine (PHENERGAN) 25 MG tablet TAKE 1 TABLET BY MOUTH TWICE  DAILY AS NEEDED FOR NAUSEA OR  VOMITING   REPATHA SURECLICK 010 MG/ML SOAJ INJECT '140MG'$  SUBCUTANEOUSLY   EVERY 2 WEEKS   sodium chloride (OCEAN) 0.65 % SOLN nasal spray Place 2 sprays into both nostrils daily as needed for congestion.   topiramate (TOPAMAX) 50 MG tablet Take 50 mg by mouth at bedtime.   traMADol (ULTRAM) 50 MG tablet Take 1 tablet (50 mg total) by mouth every 12 (twelve) hours as needed for up to 5 days.   Allergies  Allergen Reactions   Ativan [Lorazepam]     Pt states it makes her tongue do weird things    Augmentin [Amoxicillin-Pot Clavulanate]     Upset stomach    Lactose Intolerance (Gi)     Gi upset   Latex Other (See Comments)    Patient stated that she was told by her doctor that she is "allergic to" this   Tape Other (See Comments)    Patient stated that she was told by her doctor that she is "allergic to" this   Xifaxan [Rifaximin]     Pt believes this is contributing to her abdominal pain/discomfort   Drixoral Allergy Sinus [Dexbromphen-Pse-Apap Er] Rash  Recent Results (from the past 2160 hour(s))  Cytology - PAP( [place])     Status: None   Collection Time: 10/25/21  2:42 PM  Result Value Ref Range   High risk HPV Negative    Adequacy Satisfactory for evaluation.    Diagnosis      - Negative for intraepithelial lesion or malignancy (NILM)   Comment Normal Reference Range HPV - Negative   POCT glycosylated hemoglobin (Hb A1C)     Status: Abnormal   Collection Time: 10/25/21  3:17 PM  Result Value Ref Range   Hemoglobin A1C 6.3 (A) 4.0 - 5.6 %   HbA1c POC (<> result, manual entry)     HbA1c, POC (prediabetic range)     HbA1c, POC (controlled diabetic range)    CBC     Status: None   Collection Time: 11/08/21  6:57 AM  Result Value Ref Range   WBC 9.4 4.0 - 10.5 K/uL   RBC 4.33 3.87 - 5.11 MIL/uL   Hemoglobin 12.6 12.0 - 15.0 g/dL   HCT 38.6 36.0 - 46.0 %   MCV 89.1 80.0 - 100.0 fL   MCH 29.1 26.0 - 34.0 pg   MCHC 32.6 30.0 - 36.0 g/dL   RDW 13.7 11.5 - 15.5 %   Platelets 383 150 - 400 K/uL   nRBC 0.0 0.0 - 0.2 %    Comment: Performed at  [facility], [place] [address], [place], [place] [address]  Protime-INR     Status: None   Collection Time: 11/08/21  6:57 AM  Result Value Ref Range   Prothrombin Time 12.8 11.4 - 15.2 seconds   INR 1.0 0.8 - 1.2    Comment: (NOTE) INR goal varies based on device and disease states. Performed at [facility], [place] [address], [place], [place] [address]   Basic metabolic panel     Status: Abnormal   Collection Time: 11/08/21  6:57 AM  Result Value Ref Range   Sodium 135 135 - 145 mmol/L   Potassium 3.6 3.5 - 5.1 mmol/L   Chloride 105 98 - 111 mmol/L   CO2 23 22 - 32 mmol/L   Glucose, Bld 112 (H) 70 - 99 mg/dL    Comment: Glucose reference range applies only to samples taken after fasting for at least 8 hours.   BUN 20 6 - 20 mg/dL   Creatinine, Ser 0.84 0.44 - 1.00 mg/dL   Calcium 8.5 (L) 8.9 - 10.3 mg/dL   GFR, Estimated >60 >60 mL/min    Comment: (NOTE) Calculated using the CKD-EPI Creatinine Equation (2021)    Anion gap 7 5 - 15    Comment: Performed at [facility] [address] [place], [place] [address]  Glucose, capillary     Status: Abnormal   Collection Time: 11/08/21  7:00 AM  Result Value Ref Range   Glucose-Capillary 124 (H) 70 - 99 mg/dL    Comment: Glucose reference range applies only to samples taken after fasting for at least 8 hours.  Glucose, capillary     Status: Abnormal   Collection Time: 11/08/21 11:20 AM  Result Value Ref Range   Glucose-Capillary 126 (H) 70 - 99 mg/dL    Comment: Glucose reference range applies only to samples taken after fasting for at least 8 hours.  Glucose, capillary     Status: Abnormal   Collection Time: 11/08/21  1:29 PM  Result Value Ref Range   Glucose-Capillary 145 (H) 70 -  99 mg/dL    Comment: Glucose reference range applies only to samples taken after fasting for at least 8 hours.   Comment 1 Notify RN    Comment 2 Document in Chart   Platelet inhibition p2y12 (not at [facility])     Status: None    Collection Time: 11/18/21  1:00 PM  Result Value Ref Range   Platelet Function  P2Y12 See Scanned report in [place] 182 - 335 PRU    Comment: Performed at [facility] (NOTE) The literature has shown a direct correlation of PRU values over 230 with higher risks of thrombotic events. Lower PRU values are associated with platelet inhibition. Performed at [facility], [place] [address] [place], [place] [address]   Glucose, capillary     Status: Abnormal   Collection Time: 11/25/21  6:30 AM  Result Value Ref Range   Glucose-Capillary 123 (H) 70 - 99 mg/dL    Comment: Glucose reference range applies only to samples taken after fasting for at least 8 hours.  Protime-INR     Status: None   Collection Time: 11/25/21  7:08 AM  Result Value Ref Range   Prothrombin Time 12.3 11.4 - 15.2 seconds   INR 0.9 0.8 - 1.2    Comment: (NOTE) INR goal varies based on device and disease states. Performed at [facility], [place] [address] [place], [place] [address]   Basic metabolic panel     Status: Abnormal   Collection Time: 11/25/21  7:08 AM  Result Value Ref Range   Sodium 137 135 - 145 mmol/L   Potassium 3.0 (L) 3.5 - 5.1 mmol/L   Chloride 107 98 - 111 mmol/L   CO2 21 (L) 22 - 32 mmol/L   Glucose, Bld 134 (H) 70 - 99 mg/dL    Comment: Glucose reference range applies only to samples taken after fasting for at least 8 hours.   BUN 13 6 - 20 mg/dL   Creatinine, Ser 0.79 0.44 - 1.00 mg/dL   Calcium 8.9 8.9 - 10.3 mg/dL   GFR, Estimated >60 >60 mL/min    Comment: (NOTE) Calculated using the CKD-EPI Creatinine Equation (2021)    Anion gap 9 5 - 15    Comment: Performed at [facility] [address], [place], [place] [address]  CBC with Differential/Platelet     Status: Abnormal   Collection Time: 11/25/21  7:08 AM  Result Value Ref Range   WBC 12.4 (H) 4.0 - 10.5 K/uL   RBC 4.00 3.87 - 5.11 MIL/uL   Hemoglobin 12.0 12.0 - 15.0 g/dL   HCT 35.6 (L)  36.0 - 46.0 %   MCV 89.0 80.0 - 100.0 fL   MCH 30.0 26.0 - 34.0 pg   MCHC 33.7 30.0 - 36.0 g/dL   RDW 14.5 11.5 - 15.5 %   Platelets 420 (H) 150 - 400 K/uL   nRBC 0.0 0.0 - 0.2 %   Neutrophils Relative % 61 %   Neutro Abs 7.7 1.7 - 7.7 K/uL   Lymphocytes Relative 27 %   Lymphs Abs 3.3 0.7 - 4.0 K/uL   Monocytes Relative 10 %   Monocytes Absolute 1.3 (H) 0.1 - 1.0 K/uL   Eosinophils Relative 1 %   Eosinophils Absolute 0.1 0.0 - 0.5 K/uL   Basophils Relative 0 %   Basophils Absolute 0.0 0.0 - 0.1 K/uL   Immature Granulocytes 1 %   Abs Immature Granulocytes 0.07 0.00 -  0.07 K/uL    Comment: Performed at [facility], [place] [address] [place], [place] [address]  Platelet inhibition p2y12 (not at [facility])     Status: None   Collection Time: 12/19/21  7:41 AM  Result Value Ref Range   Platelet Function  P2Y12 *See Scanned Report* 182 - 335 PRU    Comment: Performed at [facility], [place] [address] [place], [place] [address]  CBC with Differential/Platelet     Status: Abnormal   Collection Time: 12/25/21  6:41 AM  Result Value Ref Range   WBC 8.1 4.0 - 10.5 K/uL   RBC 4.00 3.87 - 5.11 MIL/uL   Hemoglobin 11.7 (L) 12.0 - 15.0 g/dL   HCT 36.1 36.0 - 46.0 %   MCV 90.3 80.0 - 100.0 fL   MCH 29.3 26.0 - 34.0 pg   MCHC 32.4 30.0 - 36.0 g/dL   RDW 13.9 11.5 - 15.5 %   Platelets 319 150 - 400 K/uL   nRBC 0.0 0.0 - 0.2 %   Neutrophils Relative % 40 %   Neutro Abs 3.3 1.7 - 7.7 K/uL   Lymphocytes Relative 43 %   Lymphs Abs 3.5 0.7 - 4.0 K/uL   Monocytes Relative 10 %   Monocytes Absolute 0.8 0.1 - 1.0 K/uL   Eosinophils Relative 5 %   Eosinophils Absolute 0.4 0.0 - 0.5 K/uL   Basophils Relative 1 %   Basophils Absolute 0.1 0.0 - 0.1 K/uL   Immature Granulocytes 1 %   Abs Immature Granulocytes 0.06 0.00 - 0.07 K/uL    Comment: Performed at [facility] [address], [place], [place] [address]  Protime-INR     Status: None   Collection Time: 12/25/21  6:41 AM   Result Value Ref Range   Prothrombin Time 12.4 11.4 - 15.2 seconds   INR 0.9 0.8 - 1.2    Comment: (NOTE) INR goal varies based on device and disease states. Performed at [facility], [place] [address] [place], [place] [address]   Basic metabolic panel     Status: Abnormal   Collection Time: 12/25/21  6:41 AM  Result Value Ref Range   Sodium 140 135 - 145 mmol/L   Potassium 4.2 3.5 - 5.1 mmol/L   Chloride 108 98 - 111 mmol/L   CO2 24 22 - 32 mmol/L   Glucose, Bld 115 (H) 70 - 99 mg/dL    Comment: Glucose reference range applies only to samples taken after fasting for at least 8 hours.   BUN 20 6 - 20 mg/dL   Creatinine, Ser 0.79 0.44 - 1.00 mg/dL   Calcium 8.6 (L) 8.9 - 10.3 mg/dL   GFR, Estimated >60 >60 mL/min    Comment: (NOTE) Calculated using the CKD-EPI Creatinine Equation (2021)    Anion gap 8 5 - 15    Comment: Performed at [facility] [address], [place], [place] [address]  Glucose, capillary     Status: Abnormal   Collection Time: 12/25/21  6:50 AM  Result Value Ref Range   Glucose-Capillary 108 (H) 70 - 99 mg/dL    Comment: Glucose reference range applies only to samples taken after fasting for at least 8 hours.  POCT Activated clotting time     Status: None   Collection Time: 12/25/21 10:21 AM  Result Value Ref Range   Activated Clotting Time 215 seconds    Comment: Reference range 74-137 seconds for patients not  on anticoagulant therapy.  POCT Activated clotting time     Status: None   Collection Time: 12/25/21 11:14 AM  Result Value Ref Range   Activated Clotting Time 203 seconds    Comment: Reference range 74-137 seconds for patients not on anticoagulant therapy.  Glucose, capillary     Status: Abnormal   Collection Time: 12/25/21 12:14 PM  Result Value Ref Range   Glucose-Capillary 128 (H) 70 - 99 mg/dL    Comment: Glucose reference range applies only to samples taken after fasting for at least 8 hours.  Heparin level (unfractionated)      Status: Abnormal   Collection Time: 12/25/21  7:00 PM  Result Value Ref Range   Heparin Unfractionated <0.10 (L) 0.30 - 0.70 IU/mL    Comment: (NOTE) The clinical reportable range upper limit is being lowered to >1.10 to align with the FDA approved guidance for the current laboratory assay.  If heparin results are below expected values, and patient dosage has  been confirmed, suggest follow up testing of antithrombin III levels. Performed at [facility], [place] [address] [place], [place] [address]   Surgical PCR screen     Status: None   Collection Time: 12/25/21  8:04 PM   Specimen: Nasal Mucosa; Nasal Swab  Result Value Ref Range   MRSA, PCR NEGATIVE NEGATIVE   Staphylococcus aureus NEGATIVE NEGATIVE    Comment: (NOTE) The Xpert SA Assay (FDA approved for NASAL specimens in patients 75 years of age and older), is one component of a comprehensive surveillance program. It is not intended to diagnose infection nor to guide or monitor treatment. Performed at [facility], [place] [address] [place], [place] [address]   Glucose, capillary     Status: Abnormal   Collection Time: 12/25/21  9:41 PM  Result Value Ref Range   Glucose-Capillary 186 (H) 70 - 99 mg/dL    Comment: Glucose reference range applies only to samples taken after fasting for at least 8 hours.  CBC with Differential/Platelet     Status: Abnormal   Collection Time: 12/26/21  5:00 AM  Result Value Ref Range   WBC 8.2 4.0 - 10.5 K/uL   RBC 3.56 (L) 3.87 - 5.11 MIL/uL   Hemoglobin 10.4 (L) 12.0 - 15.0 g/dL   HCT 32.3 (L) 36.0 - 46.0 %   MCV 90.7 80.0 - 100.0 fL   MCH 29.2 26.0 - 34.0 pg   MCHC 32.2 30.0 - 36.0 g/dL   RDW 13.9 11.5 - 15.5 %   Platelets 301 150 - 400 K/uL   nRBC 0.0 0.0 - 0.2 %   Neutrophils Relative % 49 %   Neutro Abs 3.9 1.7 - 7.7 K/uL   Lymphocytes Relative 39 %   Lymphs Abs 3.2 0.7 - 4.0 K/uL   Monocytes Relative 8 %   Monocytes Absolute 0.7 0.1 - 1.0 K/uL   Eosinophils  Relative 3 %   Eosinophils Absolute 0.3 0.0 - 0.5 K/uL   Basophils Relative 0 %   Basophils Absolute 0.0 0.0 - 0.1 K/uL   Immature Granulocytes 1 %   Abs Immature Granulocytes 0.05 0.00 - 0.07 K/uL    Comment: Performed at [facility], [address], [place], [place] [address]  Basic metabolic panel     Status: Abnormal   Collection Time: 12/26/21  5:00 AM  Result Value Ref Range   Sodium 141 135 - 145 mmol/L   Potassium 3.4 (L) 3.5 - 5.1 mmol/L  Comment: DELTA CHECK NOTED   Chloride 108 98 - 111 mmol/L   CO2 24 22 - 32 mmol/L   Glucose, Bld 124 (H) 70 - 99 mg/dL    Comment: Glucose reference range applies only to samples taken after fasting for at least 8 hours.   BUN 11 6 - 20 mg/dL   Creatinine, Ser 0.78 0.44 - 1.00 mg/dL   Calcium 7.9 (L) 8.9 - 10.3 mg/dL   GFR, Estimated >60 >60 mL/min    Comment: (NOTE) Calculated using the CKD-EPI Creatinine Equation (2021)    Anion gap 9 5 - 15    Comment: Performed at [facility] [address] [place], [place] [address]  Glucose, capillary     Status: Abnormal   Collection Time: 12/26/21  7:49 AM  Result Value Ref Range   Glucose-Capillary 145 (H) 70 - 99 mg/dL    Comment: Glucose reference range applies only to samples taken after fasting for at least 8 hours.   Objective  Body mass index is 34.65 kg/m. Wt Readings from Last 3 Encounters:  12/27/21 195 lb 9.6 oz (88.7 kg)  12/25/21 189 lb (85.7 kg)  12/06/21 190 lb 6.4 oz (86.4 kg)   Temp Readings from Last 3 Encounters:  12/27/21 98.2 F (36.8 C) (Oral)  12/26/21 97.6 F (36.4 C) (Oral)  12/06/21 98.2 F (36.8 C) (Oral)   BP Readings from Last 3 Encounters:  12/27/21 120/70  12/26/21 (!) 159/82  12/06/21 140/80   Pulse Readings from Last 3 Encounters:  12/27/21 73  12/26/21 75  12/06/21 72    Physical Exam Vitals and nursing note reviewed.  Constitutional:      Appearance: Normal appearance. She is well-developed and well-groomed.  HENT:      Head: Normocephalic and atraumatic.  Eyes:     Conjunctiva/sclera: Conjunctivae normal.     Pupils: Pupils are equal, round, and reactive to light.  Cardiovascular:     Rate and Rhythm: Normal rate and regular rhythm.     Heart sounds: Normal heart sounds. No murmur heard. Pulmonary:     Effort: Pulmonary effort is normal.     Breath sounds: Normal breath sounds.  Abdominal:     General: Abdomen is flat. Bowel sounds are normal.     Tenderness: There is no abdominal tenderness.  Musculoskeletal:        General: No tenderness.  Skin:    General: Skin is warm and dry.  Neurological:     General: No focal deficit present.     Mental Status: She is alert and oriented to person, place, and time. Mental status is at baseline.     Cranial Nerves: Cranial nerves 2-12 are intact.     Motor: Motor function is intact.     Coordination: Coordination is intact.     Gait: Gait is intact.  Psychiatric:        Attention and Perception: Attention and perception normal.        Mood and Affect: Mood and affect normal.        Speech: Speech normal.        Behavior: Behavior normal. Behavior is cooperative.        Thought Content: Thought content normal.        Cognition and Memory: Cognition and memory normal.        Judgment: Judgment normal.     Assessment  Plan  Nocturnal hypoxia/hypoxia s/p recovery anesthesia 12/25/21 procedure C/w OSA (obstructive sleep apnea) F/u  with pulm 01/03/22   Nasal burning - Plan: sodium chloride (OCEAN) 0.65 % SOLN nasal spray, fluticasone (FLONASE) 50 MCG/ACT nasal spray  Acute pain of left shoulder h/o chronic right shoulder bursitis noted MRI 09/2016- Plan: DG Shoulder Left, traMADol (ULTRAM) 50 MG tablet, Ambulatory referral to Orthopedic Surgery in [place]  Ambulatory referral to Pain Clinic in [place]  Pain in both feet - Plan: traMADol (ULTRAM) 50 MG tablet, Ambulatory referral to Orthopedic Surgery, Ambulatory referral to Pain Clinic  Other chronic  pain - Plan: Ambulatory referral to Orthopedic Surgery, Ambulatory referral to Pain Clinic  Plantar fasciitis, bilateral - Plan: Ambulatory referral to Orthopedic Surgery, Ambulatory referral to Pain Clinic  Pain of midfoot, unspecified laterality - Plan: Ambulatory referral to Orthopedic Surgery, Ambulatory referral to Pain Clinic   Pain of midfoot, unspecified laterality - Plan: Ambulatory referral to Orthopedic Surgery, Ambulatory referral to Pain Clinic  Chronic abdominal pain - Plan: Ambulatory referral to Pain Clinic  Chronic bursitis of right shoulder - Plan: Ambulatory referral to Pain Clinic   Brain aneurysm F/u Dr. Estanislado Pandy s/p repair 12/25/21 [facility]   Sulfur burps per Dr. Marius Ditch maybe due to uncontrolled gastroparesis and rec increase reglan 5 qid to 10 mg qid  Pt had f/u 01/2022   HM Declines flu shot Tdap given Rx prev.  2/2 shingrix utd   Consider prevnar 20 vaccine   covid had 3/3 rec 4th    S/p hysterectomy h/o abnormal pap ? If left ovary still intact right ovary appears out per imaging -established with [facility]  H/o ovarian cyst and abnormal pap with h/o ln2 cervix will need to continue paps until age 71  Pap 10/25/21 neg neg HPV    Dr. Jonathon Bellows colonoscopy had 07/11/19 and 07/29/19 and  EGD had 07/11/19 with path no malignancy concern  H/o sigmoid resection in past for h/o diverticulitis   07/29/19 colonoscopy neg bx f/u in 10 years per GI Ask GI when this is due they confirmed 10 years this is not update in [facility] though have asked them to update this   -mammo 03/01/21 negative ordered 02/2022 [facility] [place]  mammogram  12/15/19  IMPRESSION: 1.  No mammographic or ultrasound evidence for malignancy. 2. Possible focal area of fat necrosis in the 12:30 o'clock location of the RIGHT breast, warranting follow-up.   RECOMMENDATION: Recommend RIGHT breast ultrasound in 6 months to assess stability.   rec smoking cessation smoking < or = 0.5  ppd also using THC since age 46 y.o rec cessation    Eye MD prev seen 06/25/21  Provider: Dr. Olivia Mackie McLean-Scocuzza-Internal Medicine

## 2021-12-27 NOTE — Progress Notes (Signed)
Inform pt can take 10 mg total of reglan as directed per instructions (increase dose) until has appt with GI 01/2022

## 2021-12-30 ENCOUNTER — Telehealth: Payer: Self-pay | Admitting: Internal Medicine

## 2021-12-30 ENCOUNTER — Other Ambulatory Visit (HOSPITAL_COMMUNITY): Payer: Self-pay | Admitting: Interventional Radiology

## 2021-12-30 ENCOUNTER — Telehealth: Payer: Self-pay | Admitting: Podiatry

## 2021-12-30 DIAGNOSIS — I671 Cerebral aneurysm, nonruptured: Secondary | ICD-10-CM

## 2021-12-30 NOTE — Telephone Encounter (Signed)
Patient would like a copy of her xrays sent to  [facility] [place], [place] [address]   Phone Number: [phone]

## 2021-12-30 NOTE — Progress Notes (Signed)
Received message from Orland Mustard, MD that pt is requesting a call to explain how she is supposed to be taking her Plavix. I called pt and advised her to continue the 37.5 mg Plavix dose. Her last dose of Plavix will be 01/02/22. Once she is scheduled for R MCA intervention, she will restart 75 mg Plavix 5 days prior to that procedure. She is to continue ASA 81 mg indefinitely.   Pt verbalized understanding for taking medication.   She was also advised that schedulers will reach out to her to schedule next intervention.     Narda Rutherford, AGNP-BC 12/30/2021, 2:01 PM

## 2022-01-01 DIAGNOSIS — M79671 Pain in right foot: Secondary | ICD-10-CM | POA: Diagnosis not present

## 2022-01-01 DIAGNOSIS — M79672 Pain in left foot: Secondary | ICD-10-CM | POA: Diagnosis not present

## 2022-01-02 DIAGNOSIS — M79671 Pain in right foot: Secondary | ICD-10-CM | POA: Diagnosis not present

## 2022-01-02 DIAGNOSIS — M47817 Spondylosis without myelopathy or radiculopathy, lumbosacral region: Secondary | ICD-10-CM | POA: Diagnosis not present

## 2022-01-02 DIAGNOSIS — M79672 Pain in left foot: Secondary | ICD-10-CM | POA: Diagnosis not present

## 2022-01-02 DIAGNOSIS — Z79899 Other long term (current) drug therapy: Secondary | ICD-10-CM | POA: Diagnosis not present

## 2022-01-02 DIAGNOSIS — Z5181 Encounter for therapeutic drug level monitoring: Secondary | ICD-10-CM | POA: Diagnosis not present

## 2022-01-03 ENCOUNTER — Encounter: Payer: Self-pay | Admitting: Primary Care

## 2022-01-03 ENCOUNTER — Ambulatory Visit (INDEPENDENT_AMBULATORY_CARE_PROVIDER_SITE_OTHER): Payer: Medicare Other | Admitting: Primary Care

## 2022-01-03 VITALS — BP 118/74 | HR 81 | Ht 63.0 in | Wt 195.2 lb

## 2022-01-03 DIAGNOSIS — Z8669 Personal history of other diseases of the nervous system and sense organs: Secondary | ICD-10-CM | POA: Diagnosis not present

## 2022-01-03 DIAGNOSIS — G4733 Obstructive sleep apnea (adult) (pediatric): Secondary | ICD-10-CM | POA: Diagnosis not present

## 2022-01-03 DIAGNOSIS — J449 Chronic obstructive pulmonary disease, unspecified: Secondary | ICD-10-CM

## 2022-01-03 NOTE — Assessment & Plan Note (Addendum)
-   Not currently exacerbated. Maintained on Breztri Aerosphere two puffs twice daily.

## 2022-01-06 NOTE — Telephone Encounter (Signed)
Patient called about her MyChart message she sent. Patient is aware Dr French Ana will be back on Tuesday.

## 2022-01-07 ENCOUNTER — Other Ambulatory Visit (HOSPITAL_COMMUNITY): Payer: Self-pay | Admitting: Interventional Radiology

## 2022-01-07 DIAGNOSIS — M79672 Pain in left foot: Secondary | ICD-10-CM | POA: Diagnosis not present

## 2022-01-07 DIAGNOSIS — M79671 Pain in right foot: Secondary | ICD-10-CM | POA: Diagnosis not present

## 2022-01-07 DIAGNOSIS — I671 Cerebral aneurysm, nonruptured: Secondary | ICD-10-CM

## 2022-01-08 ENCOUNTER — Telehealth: Payer: Self-pay | Admitting: Internal Medicine

## 2022-01-08 IMAGING — CT CT ABD-PELV W/O CM
2 of 4 series · 16 of 46 positions shown, 18 images · non-contrast
Comparison: 12/17/2020

CLINICAL DATA: Abdominal pain, emesis, diverticulitis suspected

EXAM:
CT ABDOMEN AND PELVIS WITHOUT CONTRAST
TECHNIQUE: Multidetector CT imaging of the abdomen and pelvis was performed
following the standard protocol without IV contrast.

[Series 2: routine abd/pel wo · axial · 0.77mm/px · z∈[-497,-77]mm · 13 of 94 slices shown, 15 images]
[im 5/94  soft-tissue]
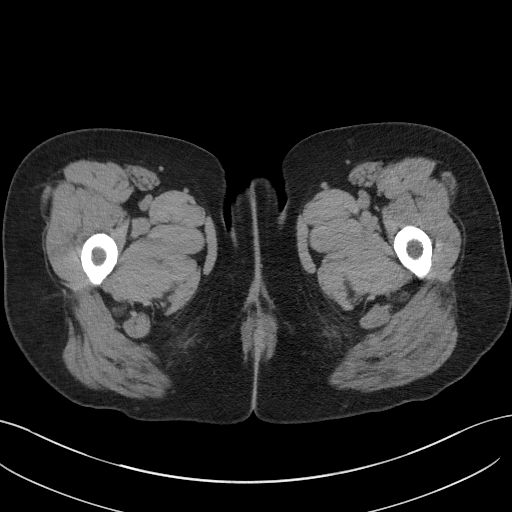
[im 5/94  bone]
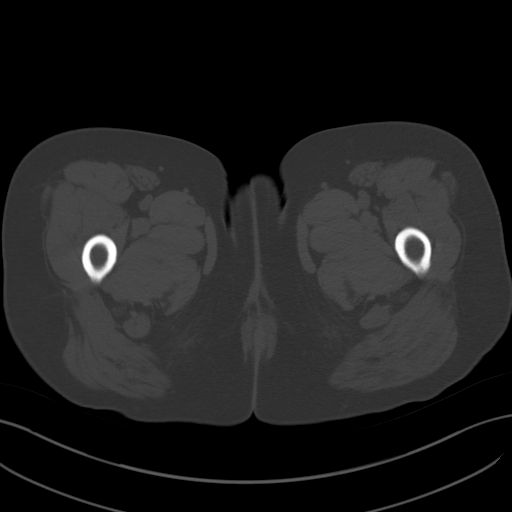
[im 13/94  soft-tissue]
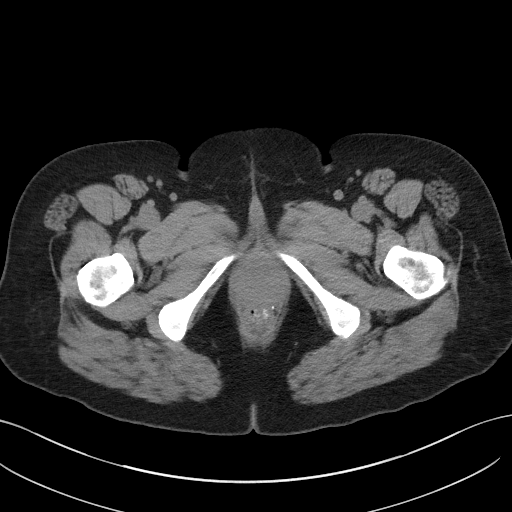
[im 21/94  soft-tissue]
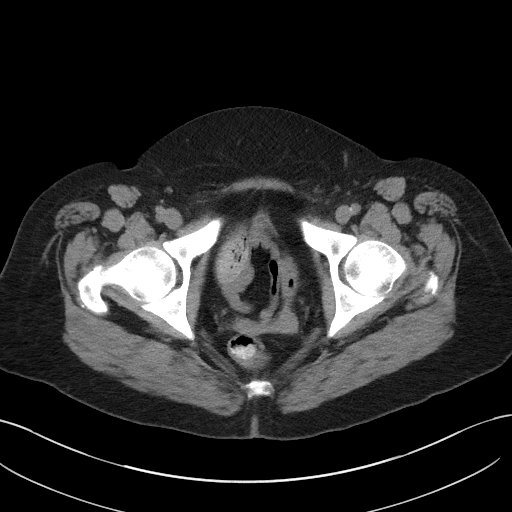
[im 25/94  soft-tissue]
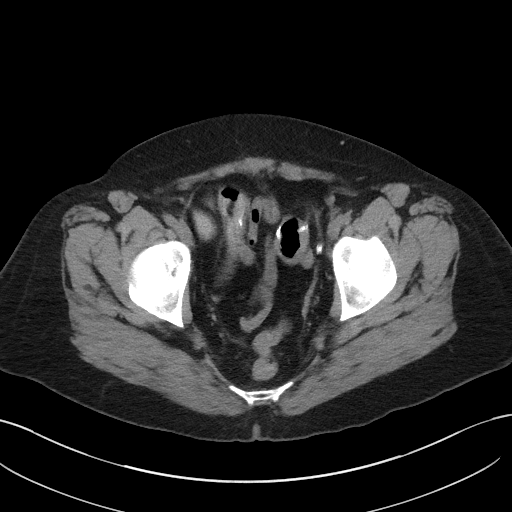
[im 33/94  soft-tissue]
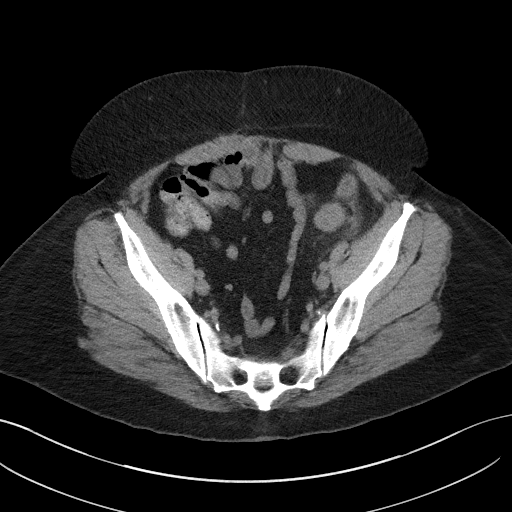
[im 41/94  soft-tissue]
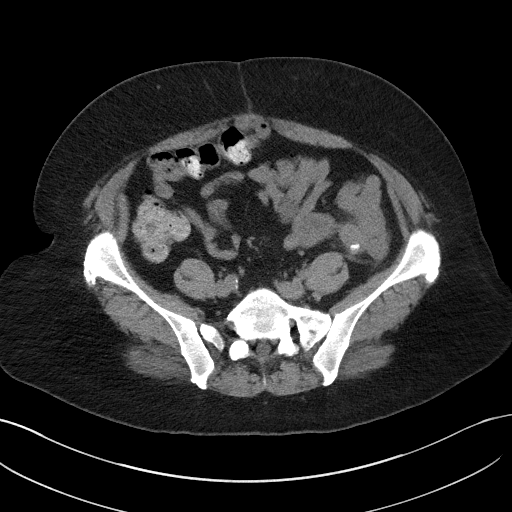
[im 49/94  soft-tissue]
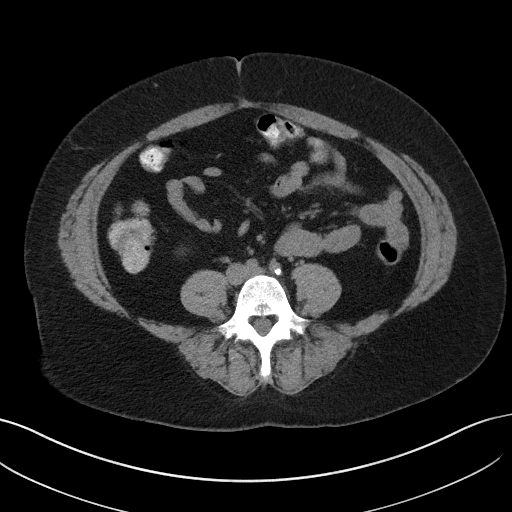
[im 53/94  soft-tissue]
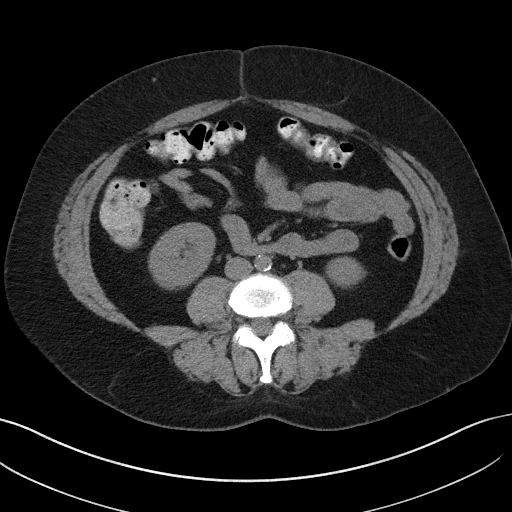
[im 61/94  soft-tissue]
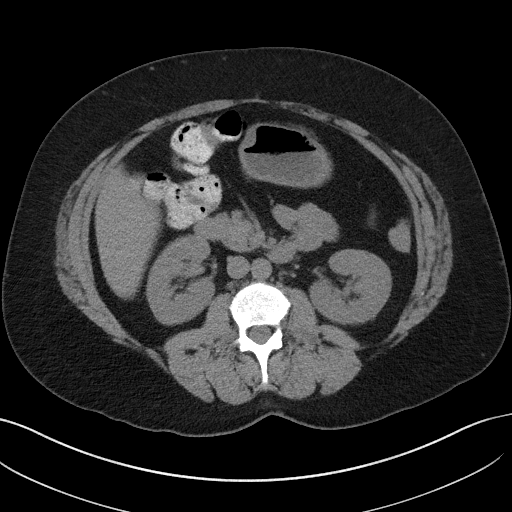
[im 61/94  bone]
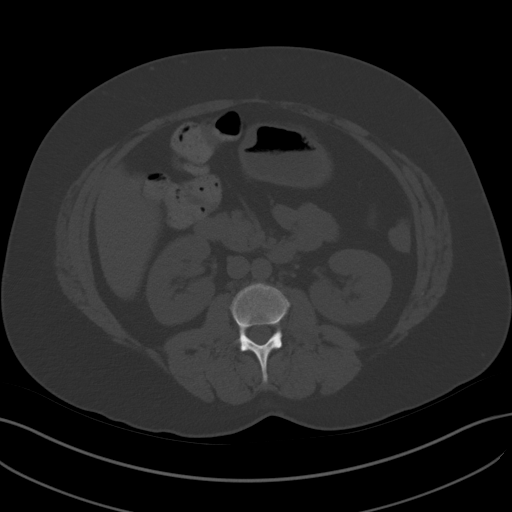
[im 69/94  soft-tissue]
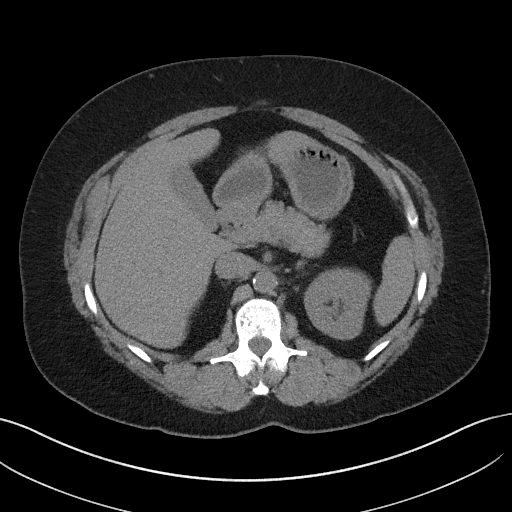
[im 73/94  soft-tissue]
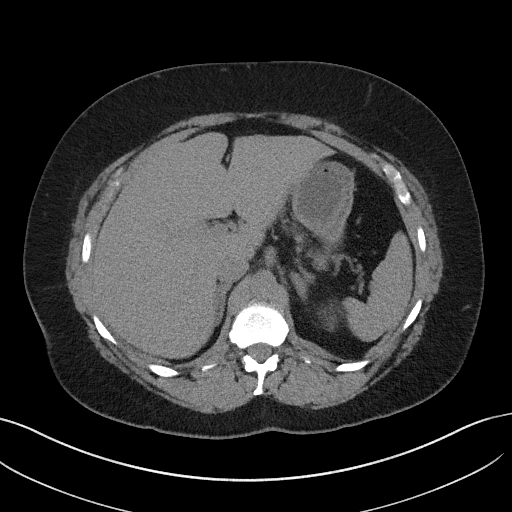
[im 81/94  soft-tissue]
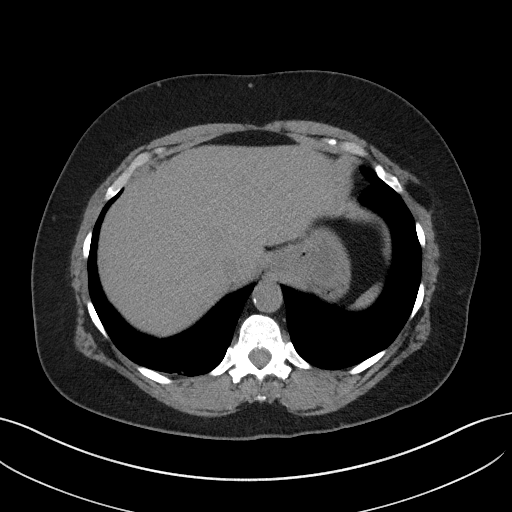
[im 89/94  soft-tissue]
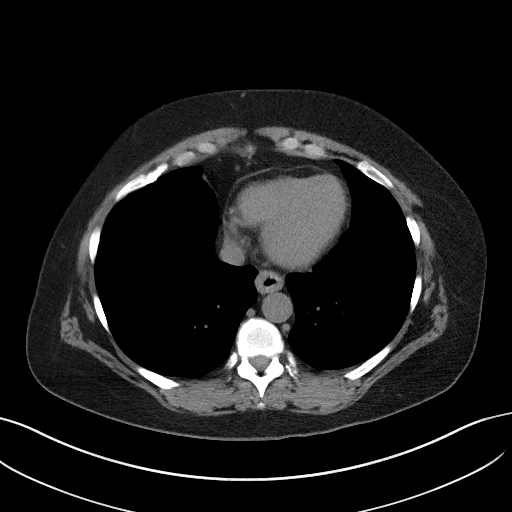

[Series 5: coronal st · coronal · 0.84mm/px · 3 of 103 slices shown]
[im 35/103  soft-tissue]
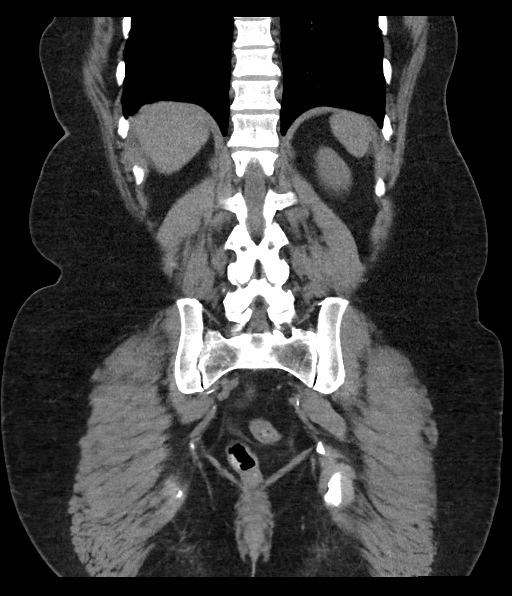
[im 46/103  soft-tissue]
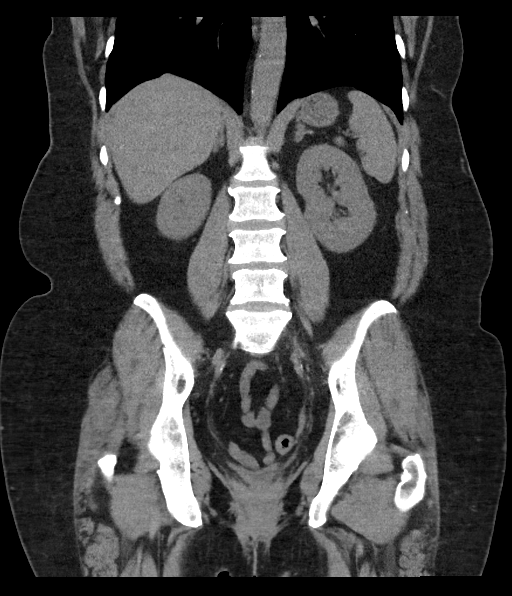
[im 57/103  soft-tissue]
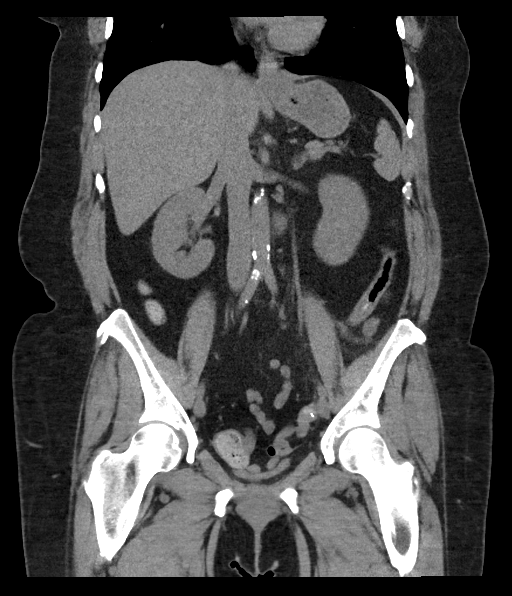

[16 of 46 positions shown; findings below may reference images not displayed]

FINDINGS: Lower chest: No acute abnormality.

Hepatobiliary: No solid liver abnormality is seen. No gallstones,
gallbladder wall thickening, or biliary dilatation.

Pancreas: Unremarkable. No pancreatic ductal dilatation or
surrounding inflammatory changes.

Spleen: Normal in size without significant abnormality.

Adrenals/Urinary Tract: Adrenal glands are unremarkable. Kidneys are
normal, without renal calculi, solid lesion, or hydronephrosis.
Bladder is unremarkable.

Stomach/Bowel: Stomach is within normal limits. Appendix appears
normal. Status post sigmoid colon resection. There is there is
redemonstrated relatively focal wall thickening and fat stranding
about diverticula of the remnant proximal sigmoid, similar to prior
examination (series 2, image 60).

Vascular/Lymphatic: Aortic atherosclerosis. No enlarged abdominal or
pelvic lymph nodes.

Reproductive: Status post hysterectomy.

Other: No abdominal wall hernia or abnormality. No abdominopelvic
ascites.

Musculoskeletal: No acute or significant osseous findings.
IMPRESSION: Status post sigmoid colon resection. There is redemonstrated
relatively focal wall thickening and fat stranding about diverticula
of the remnant proximal sigmoid, similar to prior examination.
Findings are consistent with acute diverticulitis. No evidence of
perforation or abscess.

Aortic Atherosclerosis ([id number].M).

## 2022-01-08 NOTE — Progress Notes (Signed)
Agree with the details of the visit as noted by Elizabeth Walsh, NP.  C. Laura Josafat Enrico, MD [facility] 

## 2022-01-08 NOTE — Progress Notes (Signed)
S/w pt and she is sch to see ENT Maryan Rued, DO on 8/25

## 2022-01-08 NOTE — Telephone Encounter (Signed)
Call transferred directly to triage from Call Center as the patient's home health nurse was calling with some "issues."  When the call was transferred, the patient was on the line.  She immediately went in to saying that there were issues with her isorbide RX that the [facility] RN was trying to resolve.  I advised the patient I was unfamiliar with her medications and would need to have her hold a moment to review.   According the patient's last office visit on 08/21/21, she was taking: Imdur 30 mg- 3 tablets (90 mg) once daily. Her RX did not change at that visit or anytime since.  Her last RX that I can see that our office sent in through Epic was for: isosorbide mononitrate (IMDUR) 30 MG 24 hr tablet 90 tablet 6 04/30/2021    Sig: Take 3 tablets (90 mg) by mouth once daily   Patient taking differently: Take 90 mg by mouth daily.       Sent to pharmacy as: isosorbide mononitrate (IMDUR) 30 MG 24 hr tablet   E-Prescribing Status: Receipt confirmed by pharmacy (04/30/2021  8:51 AM EDT)     Per the patient & her [facility] RN, the most recent prescription was sent in by Dr. Saunders Revel on 06/11/21 for: Imdur 60 mg- take 1 tablet by mouth TWICE daily  The patient advised that she has continue to take a total of 90 mg once daily: She confirms she is taking: Imdur 60 mg- 1.5 tablets (90 mg) once daily  However, I cannot see in Epic where this RX was ever sent in on our end.  I have advised the patient that I will change her Imdur RX in her chart to reflect exactly what she is taking, but as I was talking with her the [facility] RN in the background said she had to leave to go to another patient's home. The patient then had another call come in and clicked over to the other call without warning.  I then disconnected the call on my end.

## 2022-01-08 NOTE — Telephone Encounter (Signed)
Referred to ortho check status  Rx rollator its hard to qualify for scooter  Also mail handicap form as well and Rx rollator

## 2022-01-08 NOTE — Progress Notes (Signed)
Is pt interested in seeing ENT for inspire procedure for sleep apnea?

## 2022-01-08 NOTE — Telephone Encounter (Signed)
Shawna Anderson called from [employer] stating pt did not pass her mini-cog test and her dementia test. Her phq9-depression score is 14. Pt need more screening and put on anti-depressant

## 2022-01-08 NOTE — Telephone Encounter (Signed)
If pt depressed/anxious have her call below for appt or when establishes with new PCP in [place] they can refer to psychiatry there    [place] as given the info before for both  [facility] and psychiatry [facility]  [place] [address] [phone]    [facility] [place]  [address]  [place] [address]  [phone]

## 2022-01-15 DIAGNOSIS — M25511 Pain in right shoulder: Secondary | ICD-10-CM | POA: Diagnosis not present

## 2022-01-15 DIAGNOSIS — M25512 Pain in left shoulder: Secondary | ICD-10-CM | POA: Diagnosis not present

## 2022-01-15 DIAGNOSIS — M7542 Impingement syndrome of left shoulder: Secondary | ICD-10-CM | POA: Diagnosis not present

## 2022-01-15 DIAGNOSIS — M7541 Impingement syndrome of right shoulder: Secondary | ICD-10-CM | POA: Diagnosis not present

## 2022-01-16 NOTE — Addendum Note (Signed)
Addended by: Orland Mustard on: 01/16/2022 04:53 PM   Modules accepted: Orders

## 2022-01-17 ENCOUNTER — Other Ambulatory Visit (HOSPITAL_COMMUNITY): Payer: Self-pay | Admitting: Physician Assistant

## 2022-01-17 ENCOUNTER — Inpatient Hospital Stay (HOSPITAL_COMMUNITY): Payer: Medicare Other | Admitting: Vascular Surgery

## 2022-01-17 DIAGNOSIS — Z01818 Encounter for other preprocedural examination: Secondary | ICD-10-CM

## 2022-01-17 NOTE — Anesthesia Preprocedure Evaluation (Addendum)
Anesthesia Evaluation  Patient identified by MRN, date of birth, ID band Patient awake    Reviewed: Allergy & Precautions, H&P , NPO status , Patient's Chart, lab work & pertinent test results  History of Anesthesia Complications (+) PONV and history of anesthetic complications  Airway Mallampati: III  TM Distance: <3 FB Neck ROM: Full    Dental no notable dental hx. (+) Poor Dentition, Chipped, Missing, Loose,    Pulmonary neg pulmonary ROS, asthma , sleep apnea , Patient abstained from smoking., former smoker,    Pulmonary exam normal breath sounds clear to auscultation       Cardiovascular Exercise Tolerance: Good hypertension, Pt. on medications + CAD and + Peripheral Vascular Disease  negative cardio ROS Normal cardiovascular exam Rhythm:Regular Rate:Normal  EKG:04/30/21: NSR. Rate 71. Nonspecific t wave abnormality.   Cath 09/11/2020: Conclusions: 1. Moderately severe multivessel coronary artery disease including mild plaquing of the proximal and distal LAD, 40% in-stent restenosis of ostial LCx, 30% distal LCx lesion, and focal 60% mid RCA stenosis. 2. Hyperdynamic left ventricular contraction with moderately elevated filling pressure. 3. Significant difficulty attaining adequate sedation during procedure. The patient would fluctuate between marked agitation and sleep every few minutes after having received 3 mg of midazolam and 75 mcg of fentanyl.  TTE 03/16/2019: 1. The left ventricle has normal systolic function with an ejection  fraction of 60-65%. The cavity size was normal. Left ventricular diastolic  Doppler parameters are consistent with impaired relaxation.  2. The right ventricle has normal systolic function. The cavity was  normal. There is no increase in right ventricular wall thickness.Unable to  estimate RVSP.    Neuro/Psych  Headaches, PSYCHIATRIC DISORDERS Anxiety Depression Bipolar Disorder negative  neurological ROS  negative psych ROS   GI/Hepatic negative GI ROS, Neg liver ROS, GERD  Medicated,  Endo/Other  negative endocrine ROSdiabetes  Renal/GU negative Renal ROS  negative genitourinary   Musculoskeletal negative musculoskeletal ROS (+) Arthritis , Osteoarthritis,    Abdominal   Peds negative pediatric ROS (+)  Hematology negative hematology ROS (+)   Anesthesia Other Findings 6/14 anesthetic Induction Type: IV induction Ventilation: Mask ventilation without difficulty Laryngoscope Size: Mac, 3 and Glidescope Grade View: Grade II Tube type: Oral Tube size: 7.5 mm Number of attempts: 1 Airway Equipment and Method: Stylet Placement Confirmation: ETT inserted through vocal cords under direct vision, positive ETCO2 and breath sounds checked- equal and bilateral Tube secured with: Tape Dental Injury: Teeth and Oropharynx as per pre-operative assessment  Difficulty Due To: Difficulty was unanticipated Future Recommendations: Recommend- induction with short-acting agent, and alternative techniques readily available Comments: Attempted with MAC 3 X 1 with only view of tip of epiglottis.  Returned to mask ventilate and on 2nd attempt used glidescope with grade 2 view.  ETT easily placed and + EtCo2  Reproductive/Obstetrics negative OB ROS                          Anesthesia Physical Anesthesia Plan  ASA: 4  Anesthesia Plan: General   Post-op Pain Management: Minimal or no pain anticipated   Induction: Intravenous  PONV Risk Score and Plan: 3 and Ondansetron, Dexamethasone and Midazolam  Airway Management Planned: Oral ETT and Video Laryngoscope Planned  Additional Equipment: ClearSight and Arterial line  Intra-op Plan:   Post-operative Plan: Possible Post-op intubation/ventilation  Informed Consent: I have reviewed the patients History and Physical, chart, labs and discussed the procedure including the risks, benefits and alternatives  for the proposed anesthesia with the patient or authorized representative who has indicated his/her understanding and acceptance.     Dental advisory given  Plan Discussed with: Anesthesiologist and CRNA  Anesthesia Plan Comments: (PAT note written 01/17/2022 by Myra Gianotti, PA-C. DISCUSSION: Patient is a 56 year old female scheduled for the above procedure. She has a right PCA and right MCA aneurysm, s/p coiling of right PCA aneurysm 12/25/21. Orders/consent are still pending, but notes suggest that she is now for intervention of right MCA aneurysm.   History includes former smoker (quit 12/31/20), post-operative N/V, spinal headache, COPD/chronic bronchitis, asthma, CAD (s/p LCX stent 2010 with chronic stable angina), HTN, HLD, DM2, OSA (intolerant to CPAP), GERD, Bipolar 1 disorder, glaucoma, cerebral aneurysm (right PCA & right MCA aneurysms, s/p endovascular coiling of right PCA aneurysm 12/25/21).   Last seen by cardiologist Dr. Saunders Revel 08/21/2021. Noted that her chest pain symptoms had improved with escalation of isosorbide mononitrate 90 mg daily. ABIs were ordered due to patient report of leg cramping (test done 09/26/2021 showed evidence of some narrowing/blockage in small arteries in her calf/feet, however large arteries had no significant disease). She was recommended follow-up in 6 months.  Last pulmonology visit 01/03/22 by Geraldo Pitter, NP. She notes patient has not been able to complete PFTs due to severe anxiety. Breztri is helpful with her symptoms. She is intolerant to CPAP and will not use nocturnal O2, so she expressed interest in the [facility] Device, so updated sleep study ordered. 3 month follow-up recommended.     She follows with hematology for history of waxing and waning leukocytosis and thrombocytosis. Last seen bu Dr. Janese Banks on 08/27/21. WBC was normal and platelets mildly elevated at 475. Myeloproliferative work-up in the past has been otherwise unremarkable. Year  follow-up recommended.  She is a same-day work-up, so anesthesia team to evaluate on the day of surgery.Labs on arrival as indicated.Per [facility] medication list from 01/09/22, she is on ASA 81 mg, but Plavix is listed as being on hold; however IR notation from 01/09/22 state, "Her last dose of Plavix will be 01/02/22. Once she is scheduled for R MCA intervention, she will restart 75 mg Plavix 5 days prior to that procedure. She is to continue ASA 81 mg indefinitely." I've asked our nursing staff to confirm that patient is taking ASA and Plavix when they call her for pre-procedure/PAT instructions. )      Anesthesia Quick Evaluation

## 2022-01-17 NOTE — Progress Notes (Signed)
Anesthesia Chart Review: Shawna Anderson  Case: [id number] Date/Time: 01/20/22 0815   Procedure: Aneurysm embolization   Anesthesia type: General   Pre-op diagnosis: Brain aneurysm I67.1   Location: [facility] RADIOLOGY RO[facility] / [facility]   Surgeons: Luanne Bras, MD       DISCUSSION: Patient is a 56 year old female scheduled for the above procedure. She has a right PCA and right MCA aneurysm, s/p coiling of right PCA aneurysm 12/25/21. Orders/consent are still pending, but notes suggest that she is now for intervention of right MCA aneurysm.   History includes former smoker (quit 12/31/20), post-operative N/V, spinal headache, COPD/chronic bronchitis, asthma, CAD (s/p LCX stent 2010 with chronic stable angina), HTN, HLD, DM2, OSA (intolerant to CPAP), GERD, Bipolar 1 disorder, glaucoma, cerebral aneurysm (right PCA & right MCA aneurysms, s/p endovascular coiling of right PCA aneurysm 12/25/21).   Last seen by cardiologist Dr. Saunders Revel 08/21/2021.  Noted that her chest pain symptoms had improved with escalation of isosorbide mononitrate 90 mg daily.  ABIs were ordered due to patient report of leg cramping (test done 09/26/2021 showed evidence of some narrowing/blockage in small arteries in her calf/feet, however large arteries had no significant disease).  She was recommended follow-up in 6 months.   Last pulmonology visit 01/03/22 by Geraldo Pitter, NP. She notes patient has not been able to complete PFTs due to severe anxiety. Breztri is helpful with her symptoms. She is intolerant to CPAP and will not use nocturnal O2, so she expressed interest in the [facility] Device, so updated sleep study ordered. 3 month follow-up recommended.     She follows with hematology for history of waxing and waning leukocytosis and thrombocytosis.  Last seen bu Dr. Janese Banks on 08/27/21. WBC was normal and platelets mildly elevated at 475.  Myeloproliferative work-up in the past has been otherwise unremarkable. Year follow-up recommended.    She is a same-day work-up, so anesthesia team to evaluate on the day of surgery. Labs on arrival as indicated. Per [facility] medication list from 01/09/22, she is on ASA 81 mg, but Plavix is listed as being on hold; however IR notation from 01/09/22 state, "Her last dose of Plavix will be 01/02/22. Once she is scheduled for R MCA intervention, she will restart 75 mg Plavix 5 days prior to that procedure. She is to continue ASA 81 mg indefinitely." I've asked our nursing staff to confirm that patient is taking ASA and Plavix when they call her for pre-procedure/PAT instructions.     VS:  BP Readings from Last 3 Encounters:  01/03/22 118/74  12/27/21 120/70  12/26/21 (!) 159/82   Pulse Readings from Last 3 Encounters:  01/03/22 81  12/27/21 73  12/26/21 75     PROVIDERS: McLean-Scocuzza, Nino Glow, MD is PCP  Nelva Bush, MD is cardiologist Vernard Gambles, MD is pulmonologist Randa Evens, MD is hematologist   LABS: Most recent lab results include: Lab Results  Component Value Date   WBC 8.2 12/26/2021   HGB 10.4 (L) 12/26/2021   HCT 32.3 (L) 12/26/2021   PLT 301 12/26/2021   GLUCOSE 124 (H) 12/26/2021   CHOL 178 08/21/2021   TRIG 239 (H) 08/21/2021   HDL 67 08/21/2021   LDLDIRECT 148.0 (H) 08/30/2020   LDLCALC 73 08/21/2021   ALT 58 (H) 08/21/2021   AST 41 (H) 08/21/2021   NA 141 12/26/2021   K 3.4 (L) 12/26/2021   CL 108 12/26/2021   CREATININE 0.78 12/26/2021   BUN 11 12/26/2021   CO2  24 12/26/2021   TSH 3.052 02/19/2021   INR 0.9 12/25/2021   HGBA1C 6.3 (A) 10/25/2021     IMAGES: Cerebral Angiogram/Transcatheter Embolization 12/25/21: IMPRESSION: 1. 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 2. 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  CXR 11/28/21: FINDINGS: The heart size and mediastinal contours are within normal limits. Both lungs are clear. The  visualized skeletal structures are unremarkable. IMPRESSION: No active cardiopulmonary disease.     EKG: 04/30/21: NSR. Rate 71. Nonspecific t wave abnormality.    CV: Cath 09/11/2020: Conclusions: Moderately severe multivessel coronary artery disease including mild plaquing of the proximal and distal LAD, 40% in-stent restenosis of ostial LCx, 30% distal LCx lesion, and focal 60% mid RCA stenosis. Hyperdynamic left ventricular contraction with moderately elevated filling pressure. Significant difficulty attaining adequate sedation during procedure.  The patient would fluctuate between marked agitation and sleep every few minutes after having received 3 mg of midazolam and 75 mcg of fentanyl.   Recommendations: Continue current antianginal therapy with escalation as tolerated if recurrent angina occurs. Add furosemide 20 mg daily for gentle diuresis in the setting of elevated LVEDP consistent with diastolic dysfunction.  This may be contributing to the patient's symptoms. If the patient has refractory angina, functional study to assess hemodynamic significance of LCx/RCA disease is recommended.  If catheterization/PCI is needed in the future, involvement of anesthesia will need to be considered.    TTE 03/16/2019:  1. The left ventricle has normal systolic function with an ejection  fraction of 60-65%. The cavity size was normal. Left ventricular diastolic  Doppler parameters are consistent with impaired relaxation.   2. The right ventricle has normal systolic function. The cavity was  normal. There is no increase in right ventricular wall thickness.Unable to  estimate RVSP.    Past Medical History:  Diagnosis Date   Anxiety    Asthma    Bipolar 1 disorder ([place])    Bipolar disorder ([place])    CAD (coronary artery disease)    s/p stent BMS OM Cx   Cervical herniated disc 04/12/2016   COPD (chronic obstructive pulmonary disease) (HCC)    Depression    Diabetes mellitus with  gastroparesis (HCC)    Diabetes mellitus without complication (HCC)    no meds, diet controlled   Diverticulitis    GERD (gastroesophageal reflux disease)    Glaucoma    History of blood transfusion    Hyperlipidemia    Hypertension    OSA (obstructive sleep apnea)    not using cpap    Plantar fasciitis    b/l feet s/p steroid shots w/o help and left surgery w/o help    PONV (postoperative nausea and vomiting)    Spinal headache    UTI (urinary tract infection)     Past Surgical History:  Procedure Laterality Date   ABDOMINAL HYSTERECTOMY     ABDOMINAL SURGERY  1995   Bowel resection.   CARDIAC CATHETERIZATION N/A 04/14/2016   Procedure: Left Heart Cath and Coronary Angiography;  Surgeon: Burnell Blanks, MD;  Location: [place] [facility];  Service: Cardiovascular;  Laterality: N/A;   CARDIAC SURGERY     COLONOSCOPY WITH PROPOFOL N/A 07/11/2019   Procedure: COLONOSCOPY WITH PROPOFOL;  Surgeon: Jonathon Bellows, MD;  Location: [facility] ENDOSCOPY;  Service: Gastroenterology;  Laterality: N/A;   COLONOSCOPY WITH PROPOFOL N/A 07/29/2019   Procedure: COLONOSCOPY WITH PROPOFOL;  Surgeon: Jonathon Bellows, MD;  Location: [facility] ENDOSCOPY;  Service: Gastroenterology;  Laterality: N/A;   CORONARY ANGIOPLASTY WITH STENT PLACEMENT  2010   Drug eluting stent   ESOPHAGOGASTRODUODENOSCOPY (EGD) WITH PROPOFOL N/A 07/11/2019   Procedure: ESOPHAGOGASTRODUODENOSCOPY (EGD) WITH PROPOFOL;  Surgeon: Jonathon Bellows, MD;  Location: [facility] ENDOSCOPY;  Service: Gastroenterology;  Laterality: N/A;   IR 3D INDEPENDENT WKST  11/08/2021   IR 3D INDEPENDENT WKST  12/25/2021   IR ANGIO INTRA EXTRACRAN SEL COM CAROTID INNOMINATE BILAT MOD SED  11/08/2021   IR ANGIO INTRA EXTRACRAN SEL COM CAROTID INNOMINATE UNI R MOD SED  12/25/2021   IR ANGIO VERTEBRAL SEL VERTEBRAL BILAT MOD SED  11/08/2021   IR ANGIOGRAM FOLLOW UP STUDY  12/25/2021   IR ANGIOGRAM FOLLOW UP STUDY  12/25/2021   IR ANGIOGRAM FOLLOW UP STUDY  12/25/2021   IR NEURO  EACH ADD'L AFTER BASIC UNI RIGHT (MS)  12/25/2021   IR RADIOLOGIST EVAL & MGMT  10/16/2021   IR TRANSCATH/EMBOLIZ  12/25/2021   IR US GUIDE VASC ACCESS RIGHT  11/08/2021   IR US GUIDE VASC ACCESS RIGHT  12/25/2021   LEFT HEART CATH AND CORONARY ANGIOGRAPHY Left 09/11/2020   Procedure: LEFT HEART CATH AND CORONARY ANGIOGRAPHY;  Surgeon: Nelva Bush, MD;  Location: [place] [facility];  Service: Cardiovascular;  Laterality: Left;   OVARIAN CYST REMOVAL     RADIOLOGY WITH ANESTHESIA N/A 11/08/2021   Procedure: Cyril Loosen;  Surgeon: Luanne Bras, MD;  Location: [place];  Service: Radiology;  Laterality: N/A;   RADIOLOGY WITH ANESTHESIA N/A 12/25/2021   Procedure: EMBOLIZATION;  Surgeon: Luanne Bras, MD;  Location: [place];  Service: Radiology;  Laterality: N/A;    MEDICATIONS: No current facility-administered medications for this encounter.    albuterol (PROAIR HFA) 108 (90 Base) MCG/ACT inhaler   alum & mag hydroxide-simeth (MAALOX/MYLANTA) 200-200-20 MG/5ML suspension   amLODipine (NORVASC) 10 MG tablet   aspirin 81 MG chewable tablet   atorvastatin (LIPITOR) 80 MG tablet   baclofen (LIORESAL) 10 MG tablet   bismuth subsalicylate (PEPTO BISMOL) 262 MG/15ML suspension   Budeson-Glycopyrrol-Formoterol (BREZTRI AEROSPHERE) 160-9-4.8 MCG/ACT AERO   clopidogrel (PLAVIX) 75 MG tablet   dicyclomine (BENTYL) 20 MG tablet   divalproex (DEPAKOTE) 500 MG DR tablet   ezetimibe (ZETIA) 10 MG tablet   fluticasone (FLONASE) 50 MCG/ACT nasal spray   gabapentin (NEURONTIN) 300 MG capsule   ipratropium (ATROVENT) 0.06 % nasal spray   Ipratropium-Albuterol (COMBIVENT RESPIMAT) 20-100 MCG/ACT AERS respimat   ipratropium-albuterol (DUONEB) 0.5-2.5 (3) MG/3ML SOLN   isosorbide mononitrate (IMDUR) 60 MG 24 hr tablet   levocetirizine (XYZAL) 5 MG tablet   methocarbamol (ROBAXIN) 500 MG tablet   metoCLOPramide (REGLAN) 5 MG tablet   naloxone (NARCAN) nasal spray 4 mg/0.1 mL   nitroGLYCERIN  (NITROSTAT) 0.4 MG SL tablet   olmesartan-hydrochlorothiazide (BENICAR HCT) 20-12.5 MG tablet   ondansetron (ZOFRAN-ODT) 4 MG disintegrating tablet   pantoprazole (PROTONIX) 40 MG tablet   potassium chloride (MICRO-K) 10 MEQ CR capsule   Probiotic Product (PROBIOTIC DAILY PO)   promethazine (PHENERGAN) 25 MG tablet   REPATHA SURECLICK 185 MG/ML SOAJ   topiramate (TOPAMAX) 50 MG tablet   traMADol (ULTRAM) 50 MG tablet   Acetylcysteine (NAC) 600 MG CAPS   sodium chloride (OCEAN) 0.65 % SOLN nasal spray    Myra Gianotti, PA-C Surgical Short Stay/Anesthesiology [facility] Phone [phone] [facility] Phone [phone] 01/17/2022 10:34 AM

## 2022-01-18 ENCOUNTER — Encounter (HOSPITAL_COMMUNITY): Payer: Self-pay | Admitting: Interventional Radiology

## 2022-01-18 ENCOUNTER — Other Ambulatory Visit: Payer: Self-pay

## 2022-01-18 NOTE — Progress Notes (Signed)
PCP - Dr. Faythe Casa, T.  Cardiologist - Dr. Saunders Revel  EP- Denies  Endocrine- Denies  Pulm- Dr. Volanda Napoleon, E.  Chest x-ray - 11/28/21 (E)  EKG - 04/30/21 (E)  Stress Test - 03/02/2019 (E)  ECHO - Denies  Cardiac Cath - 09/11/20 (E)  AICD-na PM-na LOOP-na  Nerve Stimulator- Denies  Dialysis- Denies  Sleep Study - Yes- Positive CPAP - Denies  LABS- 01/20/22: CBC w/D, BMP, PT  ASA- Cont. PLAVIX- Cont.  ERAS- No  HA1C- 10/25/21(E): 6.3- Pt does not take any medicines and she does not check her levels at home.  Anesthesia- Yes  Pt denies having chest pain, sob, or fever during the pre-op phone call. All instructions explained to the pt, with a verbal understanding of the material including: as of today, stop taking all Aspirin (unless instructed by your doctor) and Other Aspirin containing products, Vitamins, Fish oils, and Herbal medications. Also stop all NSAIDS i.e. Advil, Ibuprofen, Motrin, Aleve, Anaprox, Naproxen, BC, Goody Powders, and all Supplements. Pt also instructed to wear a mask and social distance if she goes out. The opportunity to ask questions was provided.

## 2022-01-19 NOTE — H&P (Signed)
Chief Complaint: Patient was seen in consultation today for cerebral angiogram with intervention for the R MCA aneurysm at the request of McLean-Scocuzza, Olivia Mackie  Referring Physician(s): McLean-Scocuzza, Olivia Mackie  Supervising Physician: Luanne Bras  Patient Status: [facility] - Out-pt  History of Present Illness: Shawna Anderson is a 56 y.o. female with PMHs of CAD s/pci LCX, HTN, HLD, GERD, OSA, substance abuse, R PCA and R MCA intracranial aneurysms s/p endovascular coil embolization of the right posterior communicating artery aneurysm by Dr. Estanislado Pandy on 12/25/21, she presents today for endovascular treatment of the R MCA aneurysm.   Patient developed HA and underwent MRA and CTA brain in early 2023 which showed right PCA and right MCA aneurysm. Patient underwent diagnostic cerebral angiogram on 4/28, and underwent coil embolization of the R PCA aneurysm on 6/14. She was discharged in stable condition on 6/15 and she presents to [facility] today for treatment of the right MCA aneurysm.   Patient seen in short stay, anesthesia team and RN at bedside.  Since 12/25/21 when she presented for R PCA aneurysm, she states that only new symptoms she has a occasional frontal headache which radiated to back of her eyes. It is difficult yo describe characteristic of the headache, it lasts couple minutes and goes away.  She denies blurry vision with HA. Denise fever, chills, shortness of breath, cough, chest pain, abdominal pain, nausea ,vomiting, and bleeding.  She was instructed to take ASA 81 mg qd and Plavix 37.5 mg qd, and she confirms that she has been taking ASA and Plavix as instructed.  Past Medical History:  Diagnosis Date   Anxiety    Arthritis    Bilateral shoulders   Asthma    Bipolar 1 disorder ([place])    Bipolar disorder ([place])    CAD (coronary artery disease)    s/p stent BMS OM Cx   Cervical herniated disc 04/12/2016   COPD (chronic obstructive pulmonary disease) ([place])     Depression    Diabetes mellitus with gastroparesis (HCC)    Diabetes mellitus without complication (HCC)    no meds, diet controlled   Diverticulitis    GERD (gastroesophageal reflux disease)    Glaucoma    History of blood transfusion    Hyperlipidemia    Hypertension    OSA (obstructive sleep apnea)    not using cpap    Plantar fasciitis    b/l feet s/p steroid shots w/o help and left surgery w/o help    PONV (postoperative nausea and vomiting)    Spinal headache    UTI (urinary tract infection)     Past Surgical History:  Procedure Laterality Date   ABDOMINAL HYSTERECTOMY     ABDOMINAL SURGERY  1995   Bowel resection.   CARDIAC CATHETERIZATION N/A 04/14/2016   Procedure: Left Heart Cath and Coronary Angiography;  Surgeon: Burnell Blanks, MD;  Location: [place] [facility];  Service: Cardiovascular;  Laterality: N/A;   CARDIAC SURGERY     COLONOSCOPY WITH PROPOFOL N/A 07/11/2019   Procedure: COLONOSCOPY WITH PROPOFOL;  Surgeon: Jonathon Bellows, MD;  Location: [facility] ENDOSCOPY;  Service: Gastroenterology;  Laterality: N/A;   COLONOSCOPY WITH PROPOFOL N/A 07/29/2019   Procedure: COLONOSCOPY WITH PROPOFOL;  Surgeon: Jonathon Bellows, MD;  Location: [facility] ENDOSCOPY;  Service: Gastroenterology;  Laterality: N/A;   CORONARY ANGIOPLASTY WITH STENT PLACEMENT  2010   Drug eluting stent   ESOPHAGOGASTRODUODENOSCOPY (EGD) WITH PROPOFOL N/A 07/11/2019   Procedure: ESOPHAGOGASTRODUODENOSCOPY (EGD) WITH PROPOFOL;  Surgeon: Jonathon Bellows, MD;  Location: [facility] ENDOSCOPY;  Service: Gastroenterology;  Laterality: N/A;   IR 3D INDEPENDENT WKST  11/08/2021   IR 3D INDEPENDENT WKST  12/25/2021   IR ANGIO INTRA EXTRACRAN SEL COM CAROTID INNOMINATE BILAT MOD SED  11/08/2021   IR ANGIO INTRA EXTRACRAN SEL COM CAROTID INNOMINATE UNI R MOD SED  12/25/2021   IR ANGIO VERTEBRAL SEL VERTEBRAL BILAT MOD SED  11/08/2021   IR ANGIOGRAM FOLLOW UP STUDY  12/25/2021   IR ANGIOGRAM FOLLOW UP STUDY  12/25/2021   IR ANGIOGRAM  FOLLOW UP STUDY  12/25/2021   IR NEURO EACH ADD'L AFTER BASIC UNI RIGHT (MS)  12/25/2021   IR RADIOLOGIST EVAL & MGMT  10/16/2021   IR TRANSCATH/EMBOLIZ  12/25/2021   IR US GUIDE VASC ACCESS RIGHT  11/08/2021   IR US GUIDE VASC ACCESS RIGHT  12/25/2021   LEFT HEART CATH AND CORONARY ANGIOGRAPHY Left 09/11/2020   Procedure: LEFT HEART CATH AND CORONARY ANGIOGRAPHY;  Surgeon: Nelva Bush, MD;  Location: [place] [facility];  Service: Cardiovascular;  Laterality: Left;   OVARIAN CYST REMOVAL     RADIOLOGY WITH ANESTHESIA N/A 11/08/2021   Procedure: Cyril Loosen;  Surgeon: Luanne Bras, MD;  Location: [place];  Service: Radiology;  Laterality: N/A;   RADIOLOGY WITH ANESTHESIA N/A 12/25/2021   Procedure: EMBOLIZATION;  Surgeon: Luanne Bras, MD;  Location: [place];  Service: Radiology;  Laterality: N/A;    Allergies: Haloperidol lactate, Lorazepam, Augmentin [amoxicillin-pot clavulanate], Lactose intolerance (gi), Latex, Tape, Xifaxan [rifaximin], and Drixoral allergy sinus [dexbromphen-pse-apap er]  Medications: Prior to Admission medications   Medication Sig Start Date End Date Taking? Authorizing Provider  Acetylcysteine (NAC) 600 MG CAPS Take 1 capsule (600 mg total) by mouth 2 (two) times daily. prn Patient not taking: Reported on 01/09/2022 12/06/21   McLean-Scocuzza, Nino Glow, MD  albuterol (PROAIR HFA) 108 (90 Base) MCG/ACT inhaler Inhale 1-2 puffs into the lungs every 4 (four) hours as needed for wheezing or shortness of breath. Patient taking differently: Inhale 1-2 puffs into the lungs 2 (two) times daily. 11/28/21   McLean-Scocuzza, Nino Glow, MD  alum & mag hydroxide-simeth (MAALOX/MYLANTA) 200-200-20 MG/5ML suspension Take 30 mLs by mouth every 6 (six) hours as needed (Sulfa Burps).    [provider]  amLODipine (NORVASC) 10 MG tablet TAKE 1 TABLET BY MOUTH AT  BEDTIME 04/19/21   McLean-Scocuzza, Nino Glow, MD  aspirin 81 MG chewable tablet Chew 81 mg by mouth daily.    [provider]  atorvastatin (LIPITOR) 80 MG tablet TAKE 1 TABLET BY MOUTH  DAILY AT 6 PM. 05/15/21   McLean-Scocuzza, Nino Glow, MD  baclofen (LIORESAL) 10 MG tablet Take 10 mg by mouth 2 (two) times daily. 11/22/21 05/21/22  [provider]  bismuth subsalicylate (PEPTO BISMOL) 262 MG/15ML suspension Take 30 mLs by mouth every 6 (six) hours as needed (Sulfa burps).    [provider]  Budeson-Glycopyrrol-Formoterol (BREZTRI AEROSPHERE) 160-9-4.8 MCG/ACT AERO Inhale 2 puffs into the lungs in the morning and at bedtime. Rinse 06/13/21   Tyler Pita, MD  clopidogrel (PLAVIX) 75 MG tablet TAKE 1 TABLET BY MOUTH  DAILY Patient taking differently: Take 37.5 mg by mouth daily. 05/01/21   Crecencio Mc, MD  dicyclomine (BENTYL) 20 MG tablet Take 1 tablet (20 mg total) by mouth 4 (four) times daily -  before meals and at bedtime. Patient taking differently: Take 20 mg by mouth daily as needed (Vomiting). 09/12/21   McLean-Scocuzza, Nino Glow, MD  divalproex (DEPAKOTE)  500 MG DR tablet TAKE 1 TABLET BY MOUTH  TWICE DAILY 08/12/21   McLean-Scocuzza, Nino Glow, MD  ezetimibe (ZETIA) 10 MG tablet TAKE 1 TABLET BY MOUTH  DAILY WITH LIPITOR 80 MG AT NIGHT 10/16/21   McLean-Scocuzza, Nino Glow, MD  fluticasone (FLONASE) 50 MCG/ACT nasal spray Place 2 sprays into both nostrils daily as needed for allergies or rhinitis. Patient taking differently: Place 2 sprays into both nostrils daily. 12/27/21   McLean-Scocuzza, Nino Glow, MD  gabapentin (NEURONTIN) 300 MG capsule Take 300 mg by mouth at bedtime. 01/01/22   [provider]  ipratropium (ATROVENT) 0.06 % nasal spray USE 2 SPRAYS IN BOTH  NOSTRILS 3 TIMES DAILY Patient taking differently: Place 2 sprays into both nostrils daily. 11/25/21   McLean-Scocuzza, Nino Glow, MD  Ipratropium-Albuterol (COMBIVENT RESPIMAT) 20-100 MCG/ACT AERS respimat Inhale 2 puffs into the lungs 2 (two) times daily.    [provider]  ipratropium-albuterol (DUONEB)  0.5-2.5 (3) MG/3ML SOLN Take 3 mLs by nebulization every 6 (six) hours as needed. Patient taking differently: Take 3 mLs by nebulization every 6 (six) hours as needed (Shortness of breath & bronchitis). 11/28/21   McLean-Scocuzza, Nino Glow, MD  isosorbide mononitrate (IMDUR) 60 MG 24 hr tablet Take 90 mg by mouth daily.    [provider]  levocetirizine (XYZAL) 5 MG tablet Take 5 mg by mouth at bedtime as needed for allergies. 10/25/20   [provider]  methocarbamol (ROBAXIN) 500 MG tablet Take 1 tablet (500 mg total) by mouth 2 (two) times daily as needed for muscle spasms. 02/07/21   McLean-Scocuzza, Nino Glow, MD  metoCLOPramide (REGLAN) 5 MG tablet TAKE 1 TABLET BY MOUTH 4  TIMES DAILY BEFORE MEALS  AND AT BEDTIME Patient taking differently: Take 5 mg by mouth 4 (four) times daily -  before meals and at bedtime. 06/10/21   Lin Landsman, MD  naloxone [facility]) nasal spray 4 mg/0.1 mL Place 4 mg into the nose as needed for opioid reversal. 01/02/22   [provider]  nitroGLYCERIN (NITROSTAT) 0.4 MG SL tablet DISSOLVE 1 TABLET UNDER  TONGUE EVERY 5 MINUTES AS  NEEDED FOR CHEST PAIN MAX  OF 3 TABLETS IN 15 MINUTES  . CALL 911 AFTER THIRD DOSE 10/16/21   Loel Dubonnet, NP  olmesartan-hydrochlorothiazide (BENICAR HCT) 20-12.5 MG tablet Take 1 tablet by mouth daily. In am if BP >130/>80 take 2 pills to equal 40-25 mg qd 10/01/21   McLean-Scocuzza, Nino Glow, MD  ondansetron (ZOFRAN-ODT) 4 MG disintegrating tablet DISSOLVE 1 TABLET ON TOP OF THE  TONGUE EVERY 8 HOURS AS NEEDED  FOR NAUEA OR VOMITING 11/21/21   McLean-Scocuzza, Nino Glow, MD  pantoprazole (PROTONIX) 40 MG tablet TAKE 1 TABLET BY MOUTH  TWICE DAILY BEFORE MEALS 06/10/21   Jonathon Bellows, MD  potassium chloride (MICRO-K) 10 MEQ CR capsule Take 2 capsules (20 mEq total) by mouth daily. 12/06/21   McLean-Scocuzza, Nino Glow, MD  Probiotic Product (PROBIOTIC DAILY PO) Take 200 mg by mouth daily.    [provider]   promethazine (PHENERGAN) 25 MG tablet TAKE 1 TABLET BY MOUTH TWICE  DAILY AS NEEDED FOR NAUSEA OR  VOMITING 11/26/21   McLean-Scocuzza, Nino Glow, MD  REPATHA SURECLICK 175 MG/ML SOAJ INJECT '140MG'$  SUBCUTANEOUSLY  EVERY 2 WEEKS 10/14/21   End, Harrell Gave, MD  sodium chloride (OCEAN) 0.65 % SOLN nasal spray Place 2 sprays into both nostrils daily as needed for congestion. Patient not taking: Reported on 01/09/2022 12/27/21  McLean-Scocuzza, Nino Glow, MD  topiramate (TOPAMAX) 50 MG tablet Take 50 mg by mouth at bedtime.    [provider]  traMADol (ULTRAM) 50 MG tablet Take 50-100 mg by mouth every 8 (eight) hours as needed for moderate pain or severe pain. 01/02/22   [provider]     Family History  Problem Relation Age of Onset   CAD Mother    Depression Mother    Heart disease Mother    Hyperlipidemia Mother    Hypertension Mother    Heart disease Father    Alcohol abuse Father    Cancer Brother 25       brain   CAD Brother    Depression Brother    Diabetes Brother    Heart disease Brother    Hyperlipidemia Brother    Lupus Other    Sickle cell anemia Other     Social History   Socioeconomic History   Marital status: Single    Spouse name: Not on file   Number of children: 1   Years of education: Not on file   Highest education level: Not on file  Occupational History   Not on file  Tobacco Use   Smoking status: Former    Packs/day: 1.00    Years: 30.00    Total pack years: 30.00    Types: Cigarettes    Quit date: 12/31/2020    Years since quitting: 1.0   Smokeless tobacco: Never   Tobacco comments:    Quit after being admitted on 6/20 01/17/2021  Vaping Use   Vaping Use: Former  Substance and Sexual Activity   Alcohol use: Yes    Comment: once a year   Drug use: Yes    Types: Marijuana    Comment: Daily   Sexual activity: Not Currently  Other Topics Concern   Not on file  Social History Narrative   From [place] now living in  [place]    1 son    No guns    Wears seat belt   Safe in relationship    Social Determinants of Health   Financial Resource Strain: Low Risk  (10/16/2021)   Overall Financial Resource Strain (CARDIA)    Difficulty of Paying Living Expenses: Not very hard  Food Insecurity: No Food Insecurity (10/16/2021)   Hunger Vital Sign    Worried About Running Out of Food in the Last Year: Never true    Ran Out of Food in the Last Year: Never true  Transportation Needs: No Transportation Needs (10/16/2021)   PRAPARE - Hydrologist (Medical): No    Lack of Transportation (Non-Medical): No  Physical Activity: Not on file  Stress: No Stress Concern Present (10/16/2021)   [place]    Feeling of Stress : Not at all  Social Connections: Unknown (10/16/2021)   Social Connection and Isolation Panel [NHANES]    Frequency of Communication with Friends and Family: More than three times a week    Frequency of Social Gatherings with Friends and Family: More than three times a week    Attends Religious Services: Not on Advertising copywriter or Organizations: Not on file    Attends Archivist Meetings: Not on file    Marital Status: Not on file     Review of Systems: A 12 point ROS discussed and pertinent positives are indicated in the HPI  above.  All other systems are negative.  Vital Signs: There were no vitals taken for this visit.   Physical Exam Vitals and nursing note reviewed.  Constitutional:      General: She is not in acute distress.    Appearance: Normal appearance. She is not ill-appearing.  HENT:     Head: Normocephalic.     Mouth/Throat:     Mouth: Mucous membranes are moist.     Pharynx: Oropharynx is clear.  Cardiovascular:     Rate and Rhythm: Normal rate and regular rhythm.     Heart sounds: Normal heart sounds.     Comments: R RA 2+, Bilateral DP 2+  Pulmonary:      Effort: Pulmonary effort is normal.     Breath sounds: Normal breath sounds.  Abdominal:     General: Abdomen is flat. Bowel sounds are normal.     Palpations: Abdomen is soft.  Musculoskeletal:     Cervical back: Neck supple.  Skin:    General: Skin is warm.     Coloration: Skin is not jaundiced or pale.  Neurological:     Mental Status: She is alert and oriented to person, place, and time.  Psychiatric:        Mood and Affect: Mood normal.        Behavior: Behavior normal.        Judgment: Judgment normal.     MD Evaluation Airway: WNL Heart: WNL Abdomen: WNL Chest/ Lungs: WNL ASA  Classification: 3 Mallampati/Airway Score: Two  Imaging: DG Shoulder Left  Result Date: 12/28/2021 CLINICAL DATA:  Left shoulder pain. EXAM: LEFT SHOULDER - 2+ VIEW COMPARISON:  None Available. FINDINGS: There is no evidence of fracture or dislocation. Acromioclavicular spurring with inferior osteophytes. Glenohumeral joint is congruent. No erosions, avascular necrosis or evidence of focal bone lesion. Soft tissues are unremarkable. IMPRESSION: Acromioclavicular osteoarthritis with inferior osteophytes. Electronically Signed   By: Keith Rake M.D.   On: 12/28/2021 18:02   IR Transcath/Emboliz  Result Date: 12/27/2021 CLINICAL DATA:  New onset of worsening headaches. 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 EXAM: TRANSCATHETER THERAPY EMBOLIZATION COMPARISON:  Recent diagnostic arteriogram of November 08, 2021. MEDICATIONS: Heparin 3,000 units IV. Ancef 2 g IV antibiotic was administered within 1 hour of the procedure. ANESTHESIA/SEDATION: General anesthesia. CONTRAST:  Omnipaque 300 approximately 275 cc. FLUOROSCOPY TIME:  Fluoroscopy Time: 10 minutes 54 seconds (1122 mGy). COMPLICATIONS: None immediate. TECHNIQUE: Informed written consent was obtained from the patient after a thorough  discussion of the procedural risks, benefits and alternatives. All questions were addressed. Maximal Sterile Barrier Technique was utilized including caps, mask, sterile gowns, sterile gloves, sterile drape, hand hygiene and skin antiseptic. A timeout was performed prior to the initiation of the procedure. The right forearm to the wrist was prepped and draped in the usual sterile manner. The right radial artery was then identified with ultrasound, and its morphology documented in the radiology PACS system. A dorsal palmar anastomosis was verified to be present. Using ultrasound guidance, a 6/7 radial sheath was then inserted over a 0.018 inch micro guidewire. The obturator, and micro guidewire were removed. Good aspiration was obtained from the side port of the radial sheath. A cocktail of 2000 units of heparin, 200 mcg of nitroglycerin, and 2.5 mg of Versed was then infused through the sheath without complications. A right radial arteriogram was performed. Over an 035 inch Roadrunner guidewire, a  5 French Simmons 2 diagnostic catheter was advanced to the aortic arch region, and selectively positioned just proximal to the origin of the right vertebral artery. An arteriogram was then performed centered extra cranially and intracranially. This was then removed. A combination of a 5.5 French 125 cm Simmons 2 catheter inside of an 072 [place] 95 cm guide catheter was then advanced into the aortic arch region and then selectively positioned in the right common carotid artery distally over the 035 inch guidewire and support catheter. The support catheter, and the guidewire were removed. Good aspiration obtained from the hub of the 072 Arm[place] guide catheter. An arteriogram was then performed centered extra cranially and intracranially. Also performed was a 3D rotational arteriogram with reformation on a separate workstation of the right anterior circulation via a right internal carotid artery injection. FINDINGS: The  origin of the right vertebral artery again demonstrates a high-grade stenosis. However, distal to this the right vertebral artery ascends normally to the cranial skull base. Wide patency is seen of the right vertebrobasilar junction and the right posterior-inferior cerebellar artery. Distally flow is noted into the distal basilar artery, and the left posterior cerebral artery and the superior cerebellar arteries. Inflow of unopacified blood is seen in the basilar artery from the contralateral vertebral artery. The right common carotid arteriogram demonstrates the origin of the right external carotid artery and its origin to be widely patent. The right internal carotid artery at the bulb to the cranial skull base demonstrates wide patency. The petrous, the cavernous and the supraclinoid segments demonstrate patency as well. A right posterior communicating artery opacifying the right posterior cerebral artery distribution. The right middle cerebral artery and the right anterior cerebral artery opacify into the capillary and venous phases. The right middle cerebral artery trifurcation region demonstrates approximally 2.9 mm x 2.4 mm wide neck saccular aneurysm projecting anteroinferiorly. Also demonstrated is an approximately 3.8 mm x 2.6 mm narrow neck saccular aneurysms arising in the right posterior communicating artery region projecting inferiorly. ENDOVASCULAR OBLITERATION OF RIGHT POSTERIOR COMMUNICATING ARTERY ANEURYSM Through the 072 guide catheter, a 126 cm 6 support catheter was advanced over a 0.035 inch Roadrunner guidewire to the horizontal petrous segment of the right internal carotid artery. The guidewire was removed. Good aspiration obtained from the hub of the support catheter. A gentle control arteriogram performed through the catheter demonstrates safe positioning of the tip of the catheter without evidence of spasms, dissections or of intraluminal filling defects. The O72 guide catheter was then  advanced to the distal right ICA. Using biplane roadmap technique and constant fluoroscopic guidance, in a coaxial manner and with constant heparinized saline infusion, an 017 2 tip microcatheter with a slight curve was then advanced over a 0.014 inch Aristotle soft tip micro guidewire with a moderate J configuration to the proximal cavernous right ICA. Using a torque device, the micro guidewire was then gently advanced without difficulty into the right posterior communicating artery region aneurysm middle of the fundus followed by the microcatheter without any difficulty. The micro guidewire was then gently retrieved and removed under constant fluoroscopic guidance ensuring no sudden forward motion movement of the intra aneurysmal microcatheter. None was noted. Good aspiration was obtained from the hub of the intra aneurysmal microcatheter which was then connected to continuous heparinized saline infusion. The first coil utilized was a Target Tetra 3 mm x 6 cm coil which was advanced into the intra aneurysmal microcatheter tip. Under constant fluoroscopic guidance, the coil was then introduced into the  aneurysm without difficulty. This was then detached. This was then followed by a 2 mm x 3.5 cm Tetra coil, and a 1.5 mm x 2 cm Tetra coil. Prior to the detachment of each coil, an arteriogram was performed ensuring safe positioning of the coil within the aneurysm. A control arteriogram performed following the third coil demonstrated complete obliteration of the aneurysm. Distally, the right anterior and the right middle cerebral artery distribution demonstrated no evidence of intravascular filling defects or of occlusions. The right posterior communicating artery remained widely patent. Under constant fluoroscopic guidance, the microcatheter was then gently retrieved and removed ensuring no movement of the coil mass. None was observed. A final control arteriogram performed following removal of the support catheter,  and microcatheter demonstrated the right internal carotid artery extra cranially and intracranially to be widely patent. More distally, the right middle and the right anterior cerebral artery demonstrated wide patency without evidence of intraluminal filling defects or of occlusions. The 95 cm Armadillo sheath was removed. The 7 French radial sheath was removed. Hemostasis at the right radial puncture site was achieved with a wrist band. Distal right radial pulse was verified to be present. General anesthesia was reversed, and patient was extubated without difficulty. Upon recovery, the patient noted no headaches or nausea or vomiting. She was able to obey simple commands appropriately. She moved all four extremities equally. She was then transferred to the PACU and then neuro ICU. Later in the evening, the patient was more fully awake, alert complaining of generalized ache. This was resolved with IV Toradol, and IV fentanyl. Overnight, the patient remained stable neurologically. The following morning, she complained of no headaches. She did complain of left shoulder stiffness. Right radial puncture site was intact with the distal right radial pulse noted to be present. Neurologically she was nonfocal. She was then ambulated. Was able to take any solids and liquids without difficulty. She was then discharged home under the care of her close friend with instructions to maintain adequate hydration and to continue taking her home medications. She was advised to make an appointment to see her primary care on Monday, to take aspirin 81 mg a day with Plavix 37.5 mg a day for 7 days then stop. 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 She was also advised to refrain from stooping, bending or lifting weights above 10 pounds for 2 weeks. Patient was  advised to refrain from driving for at least 7 days. Questions were answered to her satisfaction. She expressed understanding and agreement with the above management plan. She was also advised to make an appointment to see her primary care on Monday in regards her pulmonary status. IMPRESSION: 1. 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 2. 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 PLAN: Follow-up in clinic 2 weeks post discharge. Electronically Signed   By: Luanne Bras M.D.   On: 12/27/2021 07:49   IR NEURO EACH ADD'L AFTER BASIC UNI RIGHT (MS)  Result Date: 12/27/2021 CLINICAL DATA:  New onset of worsening headaches. 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 EXAM: TRANSCATHETER THERAPY EMBOLIZATION COMPARISON:  Recent diagnostic arteriogram of November 08, 2021. MEDICATIONS: Heparin 3,000 units IV. Ancef  2 g IV antibiotic was administered within 1 hour of the procedure. ANESTHESIA/SEDATION: General anesthesia. CONTRAST:  Omnipaque 300 approximately 275 cc. FLUOROSCOPY TIME:  Fluoroscopy Time: 10 minutes 54 seconds (1122 mGy). COMPLICATIONS: None immediate. TECHNIQUE: Informed written consent was obtained from the patient after a thorough discussion of the procedural risks, benefits and alternatives. All questions were addressed. Maximal Sterile Barrier Technique was utilized including caps, mask, sterile gowns, sterile gloves, sterile drape, hand hygiene and skin antiseptic. A timeout was performed prior to the initiation of the procedure. The right forearm to the wrist was prepped and draped in the usual sterile manner. The right radial artery was then identified with ultrasound, and its morphology documented in the radiology PACS system. A dorsal palmar anastomosis was verified to be present.  Using ultrasound guidance, a 6/7 radial sheath was then inserted over a 0.018 inch micro guidewire. The obturator, and micro guidewire were removed. Good aspiration was obtained from the side port of the radial sheath. A cocktail of 2000 units of heparin, 200 mcg of nitroglycerin, and 2.5 mg of Versed was then infused through the sheath without complications. A right radial arteriogram was performed. Over an 035 inch Roadrunner guidewire, a 5 French Simmons 2 diagnostic catheter was advanced to the aortic arch region, and selectively positioned just proximal to the origin of the right vertebral artery. An arteriogram was then performed centered extra cranially and intracranially. This was then removed. A combination of a 5.5 French 125 cm Simmons 2 catheter inside of an 072 [place] 95 cm guide catheter was then advanced into the aortic arch region and then selectively positioned in the right common carotid artery distally over the 035 inch guidewire and support catheter. The support catheter, and the guidewire were removed. Good aspiration obtained from the hub of the 072 [place] guide catheter. An arteriogram was then performed centered extra cranially and intracranially. Also performed was a 3D rotational arteriogram with reformation on a separate workstation of the right anterior circulation via a right internal carotid artery injection. FINDINGS: The origin of the right vertebral artery again demonstrates a high-grade stenosis. However, distal to this the right vertebral artery ascends normally to the cranial skull base. Wide patency is seen of the right vertebrobasilar junction and the right posterior-inferior cerebellar artery. Distally flow is noted into the distal basilar artery, and the left posterior cerebral artery and the superior cerebellar arteries. Inflow of unopacified blood is seen in the basilar artery from the contralateral vertebral artery. The right common carotid arteriogram demonstrates  the origin of the right external carotid artery and its origin to be widely patent. The right internal carotid artery at the bulb to the cranial skull base demonstrates wide patency. The petrous, the cavernous and the supraclinoid segments demonstrate patency as well. A right posterior communicating artery opacifying the right posterior cerebral artery distribution. The right middle cerebral artery and the right anterior cerebral artery opacify into the capillary and venous phases. The right middle cerebral artery trifurcation region demonstrates approximally 2.9 mm x 2.4 mm wide neck saccular aneurysm projecting anteroinferiorly. Also demonstrated is an approximately 3.8 mm x 2.6 mm narrow neck saccular aneurysms arising in the right posterior communicating artery region projecting inferiorly. ENDOVASCULAR OBLITERATION OF RIGHT POSTERIOR COMMUNICATING ARTERY ANEURYSM Through the 072 guide catheter, a 126 cm 6 support catheter was advanced over a 0.035 inch Roadrunner guidewire to the horizontal petrous segment of the right internal carotid artery. The guidewire was removed. Good aspiration obtained from the hub of the  support catheter. A gentle control arteriogram performed through the catheter demonstrates safe positioning of the tip of the catheter without evidence of spasms, dissections or of intraluminal filling defects. The O72 guide catheter was then advanced to the distal right ICA. Using biplane roadmap technique and constant fluoroscopic guidance, in a coaxial manner and with constant heparinized saline infusion, an 017 2 tip microcatheter with a slight curve was then advanced over a 0.014 inch Aristotle soft tip micro guidewire with a moderate J configuration to the proximal cavernous right ICA. Using a torque device, the micro guidewire was then gently advanced without difficulty into the right posterior communicating artery region aneurysm middle of the fundus followed by the microcatheter without any  difficulty. The micro guidewire was then gently retrieved and removed under constant fluoroscopic guidance ensuring no sudden forward motion movement of the intra aneurysmal microcatheter. None was noted. Good aspiration was obtained from the hub of the intra aneurysmal microcatheter which was then connected to continuous heparinized saline infusion. The first coil utilized was a Target Tetra 3 mm x 6 cm coil which was advanced into the intra aneurysmal microcatheter tip. Under constant fluoroscopic guidance, the coil was then introduced into the aneurysm without difficulty. This was then detached. This was then followed by a 2 mm x 3.5 cm Tetra coil, and a 1.5 mm x 2 cm Tetra coil. Prior to the detachment of each coil, an arteriogram was performed ensuring safe positioning of the coil within the aneurysm. A control arteriogram performed following the third coil demonstrated complete obliteration of the aneurysm. Distally, the right anterior and the right middle cerebral artery distribution demonstrated no evidence of intravascular filling defects or of occlusions. The right posterior communicating artery remained widely patent. Under constant fluoroscopic guidance, the microcatheter was then gently retrieved and removed ensuring no movement of the coil mass. None was observed. A final control arteriogram performed following removal of the support catheter, and microcatheter demonstrated the right internal carotid artery extra cranially and intracranially to be widely patent. More distally, the right middle and the right anterior cerebral artery demonstrated wide patency without evidence of intraluminal filling defects or of occlusions. The 95 cm Arm[place] sheath was removed. The 7 French radial sheath was removed. Hemostasis at the right radial puncture site was achieved with a wrist band. Distal right radial pulse was verified to be present. General anesthesia was reversed, and patient was extubated without  difficulty. Upon recovery, the patient noted no headaches or nausea or vomiting. She was able to obey simple commands appropriately. She moved all four extremities equally. She was then transferred to the PACU and then neuro ICU. Later in the evening, the patient was more fully awake, alert complaining of generalized ache. This was resolved with IV Toradol, and IV fentanyl. Overnight, the patient remained stable neurologically. The following morning, she complained of no headaches. She did complain of left shoulder stiffness. Right radial puncture site was intact with the distal right radial pulse noted to be present. Neurologically she was nonfocal. She was then ambulated. Was able to take any solids and liquids without difficulty. She was then discharged home under the care of her close friend with instructions to maintain adequate hydration and to continue taking her home medications. She was advised to make an appointment to see her primary care on Monday, to take aspirin 81 mg a day with Plavix 37.5 mg a day for 7 days then stop. The patient was advised to resume the Plavix 75 mg a  day 5 days prior to the next procedure which will be endovascular treatment of the right middle cerebral artery trifurcation region aneurysm under general anesthesia. She was also advised to refrain from stooping, bending or lifting weights above 10 pounds for 2 weeks. Patient was advised to refrain from driving for at least 7 days. Questions were answered to her satisfaction. She expressed understanding and agreement with the above management plan. She was also advised to make an appointment to see her primary care on Monday in regards her pulmonary status. IMPRESSION: 1. 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 2. 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 PLAN: Follow-up in clinic 2 weeks post discharge. Electronically  Signed   By: Luanne Bras M.D.   On: 12/27/2021 07:49   IR Angiogram Follow Up Study  Result Date: 12/27/2021 CLINICAL DATA:  New onset of worsening headaches. 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 EXAM: TRANSCATHETER THERAPY EMBOLIZATION COMPARISON:  Recent diagnostic arteriogram of November 08, 2021. MEDICATIONS: Heparin 3,000 units IV. Ancef 2 g IV antibiotic was administered within 1 hour of the procedure. ANESTHESIA/SEDATION: General anesthesia. CONTRAST:  Omnipaque 300 approximately 275 cc. FLUOROSCOPY TIME:  Fluoroscopy Time: 10 minutes 54 seconds (1122 mGy). COMPLICATIONS: None immediate. TECHNIQUE: Informed written consent was obtained from the patient after a thorough discussion of the procedural risks, benefits and alternatives. All questions were addressed. Maximal Sterile Barrier Technique was utilized including caps, mask, sterile gowns, sterile gloves, sterile drape, hand hygiene and skin antiseptic. A timeout was performed prior to the initiation of the procedure. The right forearm to the wrist was prepped and draped in the usual sterile manner. The right radial artery was then identified with ultrasound, and its morphology documented in the radiology PACS system. A dorsal palmar anastomosis was verified to be present. Using ultrasound guidance, a 6/7 radial sheath was then inserted over a 0.018 inch micro guidewire. The obturator, and micro guidewire were removed. Good aspiration was obtained from the side port of the radial sheath. A cocktail of 2000 units of heparin, 200 mcg of nitroglycerin, and 2.5 mg of Versed was then infused through the sheath without complications. A right radial arteriogram was performed. Over an 035 inch Roadrunner guidewire, a 5 French Simmons 2 diagnostic catheter was advanced to the aortic arch region, and selectively positioned just proximal to the  origin of the right vertebral artery. An arteriogram was then performed centered extra cranially and intracranially. This was then removed. A combination of a 5.5 French 125 cm Simmons 2 catheter inside of an 072 [place] 95 cm guide catheter was then advanced into the aortic arch region and then selectively positioned in the right common carotid artery distally over the 035 inch guidewire and support catheter. The support catheter, and the guidewire were removed. Good aspiration obtained from the hub of the 072 [place] guide catheter. An arteriogram was then performed centered extra cranially and intracranially. Also performed was a 3D rotational arteriogram with reformation on a separate workstation of the right anterior circulation via a right internal carotid artery injection. FINDINGS: The origin of the right vertebral artery again demonstrates a high-grade stenosis. However, distal to this the right vertebral artery ascends normally to the cranial skull base. Wide patency is seen of the right vertebrobasilar junction and the right posterior-inferior cerebellar artery. Distally flow is noted into the distal  basilar artery, and the left posterior cerebral artery and the superior cerebellar arteries. Inflow of unopacified blood is seen in the basilar artery from the contralateral vertebral artery. The right common carotid arteriogram demonstrates the origin of the right external carotid artery and its origin to be widely patent. The right internal carotid artery at the bulb to the cranial skull base demonstrates wide patency. The petrous, the cavernous and the supraclinoid segments demonstrate patency as well. A right posterior communicating artery opacifying the right posterior cerebral artery distribution. The right middle cerebral artery and the right anterior cerebral artery opacify into the capillary and venous phases. The right middle cerebral artery trifurcation region demonstrates approximally 2.9 mm x  2.4 mm wide neck saccular aneurysm projecting anteroinferiorly. Also demonstrated is an approximately 3.8 mm x 2.6 mm narrow neck saccular aneurysms arising in the right posterior communicating artery region projecting inferiorly. ENDOVASCULAR OBLITERATION OF RIGHT POSTERIOR COMMUNICATING ARTERY ANEURYSM Through the 072 guide catheter, a 126 cm 6 support catheter was advanced over a 0.035 inch Roadrunner guidewire to the horizontal petrous segment of the right internal carotid artery. The guidewire was removed. Good aspiration obtained from the hub of the support catheter. A gentle control arteriogram performed through the catheter demonstrates safe positioning of the tip of the catheter without evidence of spasms, dissections or of intraluminal filling defects. The O72 guide catheter was then advanced to the distal right ICA. Using biplane roadmap technique and constant fluoroscopic guidance, in a coaxial manner and with constant heparinized saline infusion, an 017 2 tip microcatheter with a slight curve was then advanced over a 0.014 inch Aristotle soft tip micro guidewire with a moderate J configuration to the proximal cavernous right ICA. Using a torque device, the micro guidewire was then gently advanced without difficulty into the right posterior communicating artery region aneurysm middle of the fundus followed by the microcatheter without any difficulty. The micro guidewire was then gently retrieved and removed under constant fluoroscopic guidance ensuring no sudden forward motion movement of the intra aneurysmal microcatheter. None was noted. Good aspiration was obtained from the hub of the intra aneurysmal microcatheter which was then connected to continuous heparinized saline infusion. The first coil utilized was a Target Tetra 3 mm x 6 cm coil which was advanced into the intra aneurysmal microcatheter tip. Under constant fluoroscopic guidance, the coil was then introduced into the aneurysm without  difficulty. This was then detached. This was then followed by a 2 mm x 3.5 cm Tetra coil, and a 1.5 mm x 2 cm Tetra coil. Prior to the detachment of each coil, an arteriogram was performed ensuring safe positioning of the coil within the aneurysm. A control arteriogram performed following the third coil demonstrated complete obliteration of the aneurysm. Distally, the right anterior and the right middle cerebral artery distribution demonstrated no evidence of intravascular filling defects or of occlusions. The right posterior communicating artery remained widely patent. Under constant fluoroscopic guidance, the microcatheter was then gently retrieved and removed ensuring no movement of the coil mass. None was observed. A final control arteriogram performed following removal of the support catheter, and microcatheter demonstrated the right internal carotid artery extra cranially and intracranially to be widely patent. More distally, the right middle and the right anterior cerebral artery demonstrated wide patency without evidence of intraluminal filling defects or of occlusions. The 95 cm Arm[place] sheath was removed. The 7 French radial sheath was removed. Hemostasis at the right radial puncture site was achieved with a wrist band. Distal  right radial pulse was verified to be present. General anesthesia was reversed, and patient was extubated without difficulty. Upon recovery, the patient noted no headaches or nausea or vomiting. She was able to obey simple commands appropriately. She moved all four extremities equally. She was then transferred to the PACU and then neuro ICU. Later in the evening, the patient was more fully awake, alert complaining of generalized ache. This was resolved with IV Toradol, and IV fentanyl. Overnight, the patient remained stable neurologically. The following morning, she complained of no headaches. She did complain of left shoulder stiffness. Right radial puncture site was intact with  the distal right radial pulse noted to be present. Neurologically she was nonfocal. She was then ambulated. Was able to take any solids and liquids without difficulty. She was then discharged home under the care of her close friend with instructions to maintain adequate hydration and to continue taking her home medications. She was advised to make an appointment to see her primary care on Monday, to take aspirin 81 mg a day with Plavix 37.5 mg a day for 7 days then stop. 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 She was also advised to refrain from stooping, bending or lifting weights above 10 pounds for 2 weeks. Patient was advised to refrain from driving for at least 7 days. Questions were answered to her satisfaction. She expressed understanding and agreement with the above management plan. She was also advised to make an appointment to see her primary care on Monday in regards her pulmonary status. IMPRESSION: 1. 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 2. 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 PLAN: Follow-up in clinic 2 weeks post discharge. Electronically Signed   By: Luanne Bras M.D.   On: 12/27/2021 07:49   IR ANGIO INTRA EXTRACRAN SEL COM CAROTID INNOMINATE UNI R MOD SED  Result Date: 12/27/2021 CLINICAL DATA:  New onset of worsening headaches. 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 EXAM: TRANSCATHETER THERAPY EMBOLIZATION COMPARISON:  Recent diagnostic arteriogram of November 08, 2021. MEDICATIONS: Heparin 3,000 units IV. Ancef 2 g IV antibiotic was administered within 1 hour of the procedure.  ANESTHESIA/SEDATION: General anesthesia. CONTRAST:  Omnipaque 300 approximately 275 cc. FLUOROSCOPY TIME:  Fluoroscopy Time: 10 minutes 54 seconds (1122 mGy). COMPLICATIONS: None immediate. TECHNIQUE: Informed written consent was obtained from the patient after a thorough discussion of the procedural risks, benefits and alternatives. All questions were addressed. Maximal Sterile Barrier Technique was utilized including caps, mask, sterile gowns, sterile gloves, sterile drape, hand hygiene and skin antiseptic. A timeout was performed prior to the initiation of the procedure. The right forearm to the wrist was prepped and draped in the usual sterile manner. The right radial artery was then identified with ultrasound, and its morphology documented in the radiology PACS system. A dorsal palmar anastomosis was verified to be present. Using ultrasound guidance, a 6/7 radial sheath was then inserted over a 0.018 inch micro guidewire. The obturator, and micro guidewire were removed. Good aspiration was obtained from the side port of the radial sheath. A cocktail of 2000 units of heparin, 200 mcg of nitroglycerin, and 2.5 mg of Versed was then infused through the  sheath without complications. A right radial arteriogram was performed. Over an 035 inch Roadrunner guidewire, a 5 French Simmons 2 diagnostic catheter was advanced to the aortic arch region, and selectively positioned just proximal to the origin of the right vertebral artery. An arteriogram was then performed centered extra cranially and intracranially. This was then removed. A combination of a 5.5 French 125 cm Simmons 2 catheter inside of an 072 [place] 95 cm guide catheter was then advanced into the aortic arch region and then selectively positioned in the right common carotid artery distally over the 035 inch guidewire and support catheter. The support catheter, and the guidewire were removed. Good aspiration obtained from the hub of the 072 [place] guide  catheter. An arteriogram was then performed centered extra cranially and intracranially. Also performed was a 3D rotational arteriogram with reformation on a separate workstation of the right anterior circulation via a right internal carotid artery injection. FINDINGS: The origin of the right vertebral artery again demonstrates a high-grade stenosis. However, distal to this the right vertebral artery ascends normally to the cranial skull base. Wide patency is seen of the right vertebrobasilar junction and the right posterior-inferior cerebellar artery. Distally flow is noted into the distal basilar artery, and the left posterior cerebral artery and the superior cerebellar arteries. Inflow of unopacified blood is seen in the basilar artery from the contralateral vertebral artery. The right common carotid arteriogram demonstrates the origin of the right external carotid artery and its origin to be widely patent. The right internal carotid artery at the bulb to the cranial skull base demonstrates wide patency. The petrous, the cavernous and the supraclinoid segments demonstrate patency as well. A right posterior communicating artery opacifying the right posterior cerebral artery distribution. The right middle cerebral artery and the right anterior cerebral artery opacify into the capillary and venous phases. The right middle cerebral artery trifurcation region demonstrates approximally 2.9 mm x 2.4 mm wide neck saccular aneurysm projecting anteroinferiorly. Also demonstrated is an approximately 3.8 mm x 2.6 mm narrow neck saccular aneurysms arising in the right posterior communicating artery region projecting inferiorly. ENDOVASCULAR OBLITERATION OF RIGHT POSTERIOR COMMUNICATING ARTERY ANEURYSM Through the 072 guide catheter, a 126 cm 6 support catheter was advanced over a 0.035 inch Roadrunner guidewire to the horizontal petrous segment of the right internal carotid artery. The guidewire was removed. Good aspiration  obtained from the hub of the support catheter. A gentle control arteriogram performed through the catheter demonstrates safe positioning of the tip of the catheter without evidence of spasms, dissections or of intraluminal filling defects. The O72 guide catheter was then advanced to the distal right ICA. Using biplane roadmap technique and constant fluoroscopic guidance, in a coaxial manner and with constant heparinized saline infusion, an 017 2 tip microcatheter with a slight curve was then advanced over a 0.014 inch Aristotle soft tip micro guidewire with a moderate J configuration to the proximal cavernous right ICA. Using a torque device, the micro guidewire was then gently advanced without difficulty into the right posterior communicating artery region aneurysm middle of the fundus followed by the microcatheter without any difficulty. The micro guidewire was then gently retrieved and removed under constant fluoroscopic guidance ensuring no sudden forward motion movement of the intra aneurysmal microcatheter. None was noted. Good aspiration was obtained from the hub of the intra aneurysmal microcatheter which was then connected to continuous heparinized saline infusion. The first coil utilized was a Target Tetra 3 mm x 6 cm coil which was advanced into  the intra aneurysmal microcatheter tip. Under constant fluoroscopic guidance, the coil was then introduced into the aneurysm without difficulty. This was then detached. This was then followed by a 2 mm x 3.5 cm Tetra coil, and a 1.5 mm x 2 cm Tetra coil. Prior to the detachment of each coil, an arteriogram was performed ensuring safe positioning of the coil within the aneurysm. A control arteriogram performed following the third coil demonstrated complete obliteration of the aneurysm. Distally, the right anterior and the right middle cerebral artery distribution demonstrated no evidence of intravascular filling defects or of occlusions. The right posterior  communicating artery remained widely patent. Under constant fluoroscopic guidance, the microcatheter was then gently retrieved and removed ensuring no movement of the coil mass. None was observed. A final control arteriogram performed following removal of the support catheter, and microcatheter demonstrated the right internal carotid artery extra cranially and intracranially to be widely patent. More distally, the right middle and the right anterior cerebral artery demonstrated wide patency without evidence of intraluminal filling defects or of occlusions. The 95 cm Armadillo sheath was removed. The 7 French radial sheath was removed. Hemostasis at the right radial puncture site was achieved with a wrist band. Distal right radial pulse was verified to be present. General anesthesia was reversed, and patient was extubated without difficulty. Upon recovery, the patient noted no headaches or nausea or vomiting. She was able to obey simple commands appropriately. She moved all four extremities equally. She was then transferred to the PACU and then neuro ICU. Later in the evening, the patient was more fully awake, alert complaining of generalized ache. This was resolved with IV Toradol, and IV fentanyl. Overnight, the patient remained stable neurologically. The following morning, she complained of no headaches. She did complain of left shoulder stiffness. Right radial puncture site was intact with the distal right radial pulse noted to be present. Neurologically she was nonfocal. She was then ambulated. Was able to take any solids and liquids without difficulty. She was then discharged home under the care of her close friend with instructions to maintain adequate hydration and to continue taking her home medications. She was advised to make an appointment to see her primary care on Monday, to take aspirin 81 mg a day with Plavix 37.5 mg a day for 7 days then stop. 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 She was also advised to refrain from stooping, bending or lifting weights above 10 pounds for 2 weeks. Patient was advised to refrain from driving for at least 7 days. Questions were answered to her satisfaction. She expressed understanding and agreement with the above management plan. She was also advised to make an appointment to see her primary care on Monday in regards her pulmonary status. IMPRESSION: 1. 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 2. 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 PLAN: Follow-up in clinic 2 weeks post discharge. Electronically Signed   By: Luanne Bras M.D.   On: 12/27/2021 07:49   IR Angiogram Follow Up Study  Result Date: 12/27/2021 CLINICAL DATA:  New onset of worsening headaches. 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 EXAM: TRANSCATHETER THERAPY EMBOLIZATION COMPARISON:  Recent  diagnostic arteriogram of November 08, 2021. MEDICATIONS: Heparin 3,000 units IV. Ancef 2 g IV antibiotic was administered within 1 hour of the procedure. ANESTHESIA/SEDATION: General anesthesia. CONTRAST:  Omnipaque 300 approximately 275 cc. FLUOROSCOPY TIME:  Fluoroscopy Time: 10 minutes 54 seconds (1122 mGy). COMPLICATIONS: None immediate. TECHNIQUE: Informed written consent was obtained from the patient after a thorough discussion of the procedural risks, benefits and alternatives. All questions were addressed. Maximal Sterile Barrier Technique was utilized including caps, mask, sterile gowns, sterile gloves, sterile drape, hand hygiene and skin antiseptic. A timeout was performed prior to the initiation of the procedure.  The right forearm to the wrist was prepped and draped in the usual sterile manner. The right radial artery was then identified with ultrasound, and its morphology documented in the radiology PACS system. A dorsal palmar anastomosis was verified to be present. Using ultrasound guidance, a 6/7 radial sheath was then inserted over a 0.018 inch micro guidewire. The obturator, and micro guidewire were removed. Good aspiration was obtained from the side port of the radial sheath. A cocktail of 2000 units of heparin, 200 mcg of nitroglycerin, and 2.5 mg of Versed was then infused through the sheath without complications. A right radial arteriogram was performed. Over an 035 inch Roadrunner guidewire, a 5 French Simmons 2 diagnostic catheter was advanced to the aortic arch region, and selectively positioned just proximal to the origin of the right vertebral artery. An arteriogram was then performed centered extra cranially and intracranially. This was then removed. A combination of a 5.5 French 125 cm Simmons 2 catheter inside of an 072 [place] 95 cm guide catheter was then advanced into the aortic arch region and then selectively positioned in the right common carotid artery distally over the 035 inch guidewire and support catheter. The support catheter, and the guidewire were removed. Good aspiration obtained from the hub of the 072 [place] guide catheter. An arteriogram was then performed centered extra cranially and intracranially. Also performed was a 3D rotational arteriogram with reformation on a separate workstation of the right anterior circulation via a right internal carotid artery injection. FINDINGS: The origin of the right vertebral artery again demonstrates a high-grade stenosis. However, distal to this the right vertebral artery ascends normally to the cranial skull base. Wide patency is seen of the right vertebrobasilar junction and the right posterior-inferior cerebellar artery. Distally flow is noted  into the distal basilar artery, and the left posterior cerebral artery and the superior cerebellar arteries. Inflow of unopacified blood is seen in the basilar artery from the contralateral vertebral artery. The right common carotid arteriogram demonstrates the origin of the right external carotid artery and its origin to be widely patent. The right internal carotid artery at the bulb to the cranial skull base demonstrates wide patency. The petrous, the cavernous and the supraclinoid segments demonstrate patency as well. A right posterior communicating artery opacifying the right posterior cerebral artery distribution. The right middle cerebral artery and the right anterior cerebral artery opacify into the capillary and venous phases. The right middle cerebral artery trifurcation region demonstrates approximally 2.9 mm x 2.4 mm wide neck saccular aneurysm projecting anteroinferiorly. Also demonstrated is an approximately 3.8 mm x 2.6 mm narrow neck saccular aneurysms arising in the right posterior communicating artery region projecting inferiorly. ENDOVASCULAR OBLITERATION OF RIGHT POSTERIOR COMMUNICATING ARTERY ANEURYSM Through the 072 guide catheter, a 126 cm 6 support catheter was advanced over a 0.035 inch Roadrunner guidewire to the horizontal petrous segment of the right internal carotid artery.  The guidewire was removed. Good aspiration obtained from the hub of the support catheter. A gentle control arteriogram performed through the catheter demonstrates safe positioning of the tip of the catheter without evidence of spasms, dissections or of intraluminal filling defects. The O72 guide catheter was then advanced to the distal right ICA. Using biplane roadmap technique and constant fluoroscopic guidance, in a coaxial manner and with constant heparinized saline infusion, an 017 2 tip microcatheter with a slight curve was then advanced over a 0.014 inch Aristotle soft tip micro guidewire with a moderate J  configuration to the proximal cavernous right ICA. Using a torque device, the micro guidewire was then gently advanced without difficulty into the right posterior communicating artery region aneurysm middle of the fundus followed by the microcatheter without any difficulty. The micro guidewire was then gently retrieved and removed under constant fluoroscopic guidance ensuring no sudden forward motion movement of the intra aneurysmal microcatheter. None was noted. Good aspiration was obtained from the hub of the intra aneurysmal microcatheter which was then connected to continuous heparinized saline infusion. The first coil utilized was a Target Tetra 3 mm x 6 cm coil which was advanced into the intra aneurysmal microcatheter tip. Under constant fluoroscopic guidance, the coil was then introduced into the aneurysm without difficulty. This was then detached. This was then followed by a 2 mm x 3.5 cm Tetra coil, and a 1.5 mm x 2 cm Tetra coil. Prior to the detachment of each coil, an arteriogram was performed ensuring safe positioning of the coil within the aneurysm. A control arteriogram performed following the third coil demonstrated complete obliteration of the aneurysm. Distally, the right anterior and the right middle cerebral artery distribution demonstrated no evidence of intravascular filling defects or of occlusions. The right posterior communicating artery remained widely patent. Under constant fluoroscopic guidance, the microcatheter was then gently retrieved and removed ensuring no movement of the coil mass. None was observed. A final control arteriogram performed following removal of the support catheter, and microcatheter demonstrated the right internal carotid artery extra cranially and intracranially to be widely patent. More distally, the right middle and the right anterior cerebral artery demonstrated wide patency without evidence of intraluminal filling defects or of occlusions. The 95 cm Arm[place]illo  sheath was removed. The 7 French radial sheath was removed. Hemostasis at the right radial puncture site was achieved with a wrist band. Distal right radial pulse was verified to be present. General anesthesia was reversed, and patient was extubated without difficulty. Upon recovery, the patient noted no headaches or nausea or vomiting. She was able to obey simple commands appropriately. She moved all four extremities equally. She was then transferred to the PACU and then neuro ICU. Later in the evening, the patient was more fully awake, alert complaining of generalized ache. This was resolved with IV Toradol, and IV fentanyl. Overnight, the patient remained stable neurologically. The following morning, she complained of no headaches. She did complain of left shoulder stiffness. Right radial puncture site was intact with the distal right radial pulse noted to be present. Neurologically she was nonfocal. She was then ambulated. Was able to take any solids and liquids without difficulty. She was then discharged home under the care of her close friend with instructions to maintain adequate hydration and to continue taking her home medications. She was advised to make an appointment to see her primary care on Monday, to take aspirin 81 mg a day with Plavix 37.5 mg a day for 7 days then  stop. 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 She was also advised to refrain from stooping, bending or lifting weights above 10 pounds for 2 weeks. Patient was advised to refrain from driving for at least 7 days. Questions were answered to her satisfaction. She expressed understanding and agreement with the above management plan. She was also advised to make an appointment to see her primary care on Monday in regards her pulmonary status. IMPRESSION: 1. 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 2. 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 PLAN: Follow-up in clinic 2 weeks post discharge. Electronically Signed   By: Luanne Bras M.D.   On: 12/27/2021 07:49   IR Angiogram Follow Up Study  Result Date: 12/27/2021 CLINICAL DATA:  New onset of worsening headaches. 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 EXAM: TRANSCATHETER THERAPY EMBOLIZATION COMPARISON:  Recent diagnostic arteriogram of November 08, 2021. MEDICATIONS: Heparin 3,000 units IV. Ancef 2 g IV antibiotic was administered within 1 hour of the procedure. ANESTHESIA/SEDATION: General anesthesia. CONTRAST:  Omnipaque 300 approximately 275 cc. FLUOROSCOPY TIME:  Fluoroscopy Time: 10 minutes 54 seconds (1122 mGy). COMPLICATIONS: None immediate. TECHNIQUE: Informed written consent was obtained from the patient after a thorough discussion of the procedural risks, benefits and alternatives. All questions were addressed. Maximal Sterile Barrier Technique was utilized including caps, mask, sterile gowns, sterile gloves, sterile drape, hand hygiene and skin antiseptic. A timeout was performed prior to the initiation of the procedure. The right forearm to the wrist was prepped and draped in the usual sterile manner. The right radial artery was then identified with ultrasound, and its morphology documented in the radiology PACS system. A dorsal palmar anastomosis was verified to be present. Using ultrasound guidance, a 6/7 radial sheath was then inserted over a 0.018 inch micro guidewire. The obturator, and micro guidewire were removed. Good aspiration was obtained from the side port of the radial sheath. A cocktail of 2000 units of heparin, 200 mcg of nitroglycerin, and 2.5 mg of Versed was then infused  through the sheath without complications. A right radial arteriogram was performed. Over an 035 inch Roadrunner guidewire, a 5 French Simmons 2 diagnostic catheter was advanced to the aortic arch region, and selectively positioned just proximal to the origin of the right vertebral artery. An arteriogram was then performed centered extra cranially and intracranially. This was then removed. A combination of a 5.5 French 125 cm Simmons 2 catheter inside of an 072 [place] 95 cm guide catheter was then advanced into the aortic arch region and then selectively positioned in the right common carotid artery distally over the 035 inch guidewire and support catheter. The support catheter, and the guidewire were removed. Good aspiration obtained from the hub of the 072 [place] guide catheter. An arteriogram was then performed centered extra cranially and intracranially. Also performed was a 3D rotational arteriogram with reformation on a separate workstation of the right anterior circulation via a right internal carotid artery injection. FINDINGS: The origin of the right vertebral artery again demonstrates a high-grade stenosis. However, distal to this the right vertebral artery ascends normally to the cranial skull base. Wide patency is seen of the right vertebrobasilar junction and  the right posterior-inferior cerebellar artery. Distally flow is noted into the distal basilar artery, and the left posterior cerebral artery and the superior cerebellar arteries. Inflow of unopacified blood is seen in the basilar artery from the contralateral vertebral artery. The right common carotid arteriogram demonstrates the origin of the right external carotid artery and its origin to be widely patent. The right internal carotid artery at the bulb to the cranial skull base demonstrates wide patency. The petrous, the cavernous and the supraclinoid segments demonstrate patency as well. A right posterior communicating artery opacifying the  right posterior cerebral artery distribution. The right middle cerebral artery and the right anterior cerebral artery opacify into the capillary and venous phases. The right middle cerebral artery trifurcation region demonstrates approximally 2.9 mm x 2.4 mm wide neck saccular aneurysm projecting anteroinferiorly. Also demonstrated is an approximately 3.8 mm x 2.6 mm narrow neck saccular aneurysms arising in the right posterior communicating artery region projecting inferiorly. ENDOVASCULAR OBLITERATION OF RIGHT POSTERIOR COMMUNICATING ARTERY ANEURYSM Through the 072 guide catheter, a 126 cm 6 support catheter was advanced over a 0.035 inch Roadrunner guidewire to the horizontal petrous segment of the right internal carotid artery. The guidewire was removed. Good aspiration obtained from the hub of the support catheter. A gentle control arteriogram performed through the catheter demonstrates safe positioning of the tip of the catheter without evidence of spasms, dissections or of intraluminal filling defects. The O72 guide catheter was then advanced to the distal right ICA. Using biplane roadmap technique and constant fluoroscopic guidance, in a coaxial manner and with constant heparinized saline infusion, an 017 2 tip microcatheter with a slight curve was then advanced over a 0.014 inch Aristotle soft tip micro guidewire with a moderate J configuration to the proximal cavernous right ICA. Using a torque device, the micro guidewire was then gently advanced without difficulty into the right posterior communicating artery region aneurysm middle of the fundus followed by the microcatheter without any difficulty. The micro guidewire was then gently retrieved and removed under constant fluoroscopic guidance ensuring no sudden forward motion movement of the intra aneurysmal microcatheter. None was noted. Good aspiration was obtained from the hub of the intra aneurysmal microcatheter which was then connected to continuous  heparinized saline infusion. The first coil utilized was a Target Tetra 3 mm x 6 cm coil which was advanced into the intra aneurysmal microcatheter tip. Under constant fluoroscopic guidance, the coil was then introduced into the aneurysm without difficulty. This was then detached. This was then followed by a 2 mm x 3.5 cm Tetra coil, and a 1.5 mm x 2 cm Tetra coil. Prior to the detachment of each coil, an arteriogram was performed ensuring safe positioning of the coil within the aneurysm. A control arteriogram performed following the third coil demonstrated complete obliteration of the aneurysm. Distally, the right anterior and the right middle cerebral artery distribution demonstrated no evidence of intravascular filling defects or of occlusions. The right posterior communicating artery remained widely patent. Under constant fluoroscopic guidance, the microcatheter was then gently retrieved and removed ensuring no movement of the coil mass. None was observed. A final control arteriogram performed following removal of the support catheter, and microcatheter demonstrated the right internal carotid artery extra cranially and intracranially to be widely patent. More distally, the right middle and the right anterior cerebral artery demonstrated wide patency without evidence of intraluminal filling defects or of occlusions. The 95 cm Armadillo sheath was removed. The 7 French radial sheath was removed. Hemostasis at  the right radial puncture site was achieved with a wrist band. Distal right radial pulse was verified to be present. General anesthesia was reversed, and patient was extubated without difficulty. Upon recovery, the patient noted no headaches or nausea or vomiting. She was able to obey simple commands appropriately. She moved all four extremities equally. She was then transferred to the PACU and then neuro ICU. Later in the evening, the patient was more fully awake, alert complaining of generalized ache. This  was resolved with IV Toradol, and IV fentanyl. Overnight, the patient remained stable neurologically. The following morning, she complained of no headaches. She did complain of left shoulder stiffness. Right radial puncture site was intact with the distal right radial pulse noted to be present. Neurologically she was nonfocal. She was then ambulated. Was able to take any solids and liquids without difficulty. She was then discharged home under the care of her close friend with instructions to maintain adequate hydration and to continue taking her home medications. She was advised to make an appointment to see her primary care on Monday, to take aspirin 81 mg a day with Plavix 37.5 mg a day for 7 days then stop. 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 She was also advised to refrain from stooping, bending or lifting weights above 10 pounds for 2 weeks. Patient was advised to refrain from driving for at least 7 days. Questions were answered to her satisfaction. She expressed understanding and agreement with the above management plan. She was also advised to make an appointment to see her primary care on Monday in regards her pulmonary status. IMPRESSION: 1. 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 2. 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 PLAN: Follow-up in clinic 2 weeks post discharge. Electronically Signed   By: Luanne Bras M.D.   On: 12/27/2021 07:49   IR 3D Independent Darreld Mclean  Result Date: 12/27/2021 CLINICAL DATA:  New onset of worsening headaches. 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 EXAM: TRANSCATHETER  THERAPY EMBOLIZATION COMPARISON:  Recent diagnostic arteriogram of November 08, 2021. MEDICATIONS: Heparin 3,000 units IV. Ancef 2 g IV antibiotic was administered within 1 hour of the procedure. ANESTHESIA/SEDATION: General anesthesia. CONTRAST:  Omnipaque 300 approximately 275 cc. FLUOROSCOPY TIME:  Fluoroscopy Time: 10 minutes 54 seconds (1122 mGy). COMPLICATIONS: None immediate. TECHNIQUE: Informed written consent was obtained from the patient after a thorough discussion of the procedural risks, benefits and alternatives. All questions were addressed. Maximal Sterile Barrier Technique was utilized including caps, mask, sterile gowns, sterile gloves, sterile drape, hand hygiene and skin antiseptic. A timeout was performed prior to the initiation of the procedure. The right forearm to the wrist was prepped and draped in the usual sterile manner. The right radial artery was then identified with ultrasound, and its morphology documented in the radiology PACS system. A dorsal palmar anastomosis was verified to be present. Using ultrasound guidance, a 6/7 radial sheath was then inserted over a 0.018 inch micro guidewire. The obturator, and micro guidewire were removed. Good aspiration was obtained from the side port of the radial sheath. A cocktail of 2000 units of heparin, 200 mcg of nitroglycerin, and 2.5 mg of Versed was  then infused through the sheath without complications. A right radial arteriogram was performed. Over an 035 inch Roadrunner guidewire, a 5 French Simmons 2 diagnostic catheter was advanced to the aortic arch region, and selectively positioned just proximal to the origin of the right vertebral artery. An arteriogram was then performed centered extra cranially and intracranially. This was then removed. A combination of a 5.5 French 125 cm Simmons 2 catheter inside of an 072 [place] 95 cm guide catheter was then advanced into the aortic arch region and then selectively positioned in the right common  carotid artery distally over the 035 inch guidewire and support catheter. The support catheter, and the guidewire were removed. Good aspiration obtained from the hub of the 072 [place] guide catheter. An arteriogram was then performed centered extra cranially and intracranially. Also performed was a 3D rotational arteriogram with reformation on a separate workstation of the right anterior circulation via a right internal carotid artery injection. FINDINGS: The origin of the right vertebral artery again demonstrates a high-grade stenosis. However, distal to this the right vertebral artery ascends normally to the cranial skull base. Wide patency is seen of the right vertebrobasilar junction and the right posterior-inferior cerebellar artery. Distally flow is noted into the distal basilar artery, and the left posterior cerebral artery and the superior cerebellar arteries. Inflow of unopacified blood is seen in the basilar artery from the contralateral vertebral artery. The right common carotid arteriogram demonstrates the origin of the right external carotid artery and its origin to be widely patent. The right internal carotid artery at the bulb to the cranial skull base demonstrates wide patency. The petrous, the cavernous and the supraclinoid segments demonstrate patency as well. A right posterior communicating artery opacifying the right posterior cerebral artery distribution. The right middle cerebral artery and the right anterior cerebral artery opacify into the capillary and venous phases. The right middle cerebral artery trifurcation region demonstrates approximally 2.9 mm x 2.4 mm wide neck saccular aneurysm projecting anteroinferiorly. Also demonstrated is an approximately 3.8 mm x 2.6 mm narrow neck saccular aneurysms arising in the right posterior communicating artery region projecting inferiorly. ENDOVASCULAR OBLITERATION OF RIGHT POSTERIOR COMMUNICATING ARTERY ANEURYSM Through the 072 guide catheter, a  126 cm 6 support catheter was advanced over a 0.035 inch Roadrunner guidewire to the horizontal petrous segment of the right internal carotid artery. The guidewire was removed. Good aspiration obtained from the hub of the support catheter. A gentle control arteriogram performed through the catheter demonstrates safe positioning of the tip of the catheter without evidence of spasms, dissections or of intraluminal filling defects. The O72 guide catheter was then advanced to the distal right ICA. Using biplane roadmap technique and constant fluoroscopic guidance, in a coaxial manner and with constant heparinized saline infusion, an 017 2 tip microcatheter with a slight curve was then advanced over a 0.014 inch Aristotle soft tip micro guidewire with a moderate J configuration to the proximal cavernous right ICA. Using a torque device, the micro guidewire was then gently advanced without difficulty into the right posterior communicating artery region aneurysm middle of the fundus followed by the microcatheter without any difficulty. The micro guidewire was then gently retrieved and removed under constant fluoroscopic guidance ensuring no sudden forward motion movement of the intra aneurysmal microcatheter. None was noted. Good aspiration was obtained from the hub of the intra aneurysmal microcatheter which was then connected to continuous heparinized saline infusion. The first coil utilized was a Target Tetra 3 mm x 6 cm coil  which was advanced into the intra aneurysmal microcatheter tip. Under constant fluoroscopic guidance, the coil was then introduced into the aneurysm without difficulty. This was then detached. This was then followed by a 2 mm x 3.5 cm Tetra coil, and a 1.5 mm x 2 cm Tetra coil. Prior to the detachment of each coil, an arteriogram was performed ensuring safe positioning of the coil within the aneurysm. A control arteriogram performed following the third coil demonstrated complete obliteration of the  aneurysm. Distally, the right anterior and the right middle cerebral artery distribution demonstrated no evidence of intravascular filling defects or of occlusions. The right posterior communicating artery remained widely patent. Under constant fluoroscopic guidance, the microcatheter was then gently retrieved and removed ensuring no movement of the coil mass. None was observed. A final control arteriogram performed following removal of the support catheter, and microcatheter demonstrated the right internal carotid artery extra cranially and intracranially to be widely patent. More distally, the right middle and the right anterior cerebral artery demonstrated wide patency without evidence of intraluminal filling defects or of occlusions. The 95 cm Armadillo sheath was removed. The 7 French radial sheath was removed. Hemostasis at the right radial puncture site was achieved with a wrist band. Distal right radial pulse was verified to be present. General anesthesia was reversed, and patient was extubated without difficulty. Upon recovery, the patient noted no headaches or nausea or vomiting. She was able to obey simple commands appropriately. She moved all four extremities equally. She was then transferred to the PACU and then neuro ICU. Later in the evening, the patient was more fully awake, alert complaining of generalized ache. This was resolved with IV Toradol, and IV fentanyl. Overnight, the patient remained stable neurologically. The following morning, she complained of no headaches. She did complain of left shoulder stiffness. Right radial puncture site was intact with the distal right radial pulse noted to be present. Neurologically she was nonfocal. She was then ambulated. Was able to take any solids and liquids without difficulty. She was then discharged home under the care of her close friend with instructions to maintain adequate hydration and to continue taking her home medications. She was advised to  make an appointment to see her primary care on Monday, to take aspirin 81 mg a day with Plavix 37.5 mg a day for 7 days then stop. 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 She was also advised to refrain from stooping, bending or lifting weights above 10 pounds for 2 weeks. Patient was advised to refrain from driving for at least 7 days. Questions were answered to her satisfaction. She expressed understanding and agreement with the above management plan. She was also advised to make an appointment to see her primary care on Monday in regards her pulmonary status. IMPRESSION: 1. 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 2. 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 PLAN: Follow-up in clinic 2 weeks post discharge. Electronically Signed   By: Luanne Bras M.D.   On: 12/27/2021 07:49   IR US Guide Vasc Access Right  Result Date: 12/27/2021 CLINICAL DATA:  New onset of worsening headaches. 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 EXAM: TRANSCATHETER  THERAPY EMBOLIZATION COMPARISON:  Recent diagnostic arteriogram of November 08, 2021. MEDICATIONS: Heparin 3,000 units IV. Ancef 2 g IV antibiotic was administered within 1 hour of the procedure. ANESTHESIA/SEDATION: General anesthesia. CONTRAST:  Omnipaque 300 approximately 275 cc. FLUOROSCOPY TIME:  Fluoroscopy Time: 10 minutes 54 seconds (1122 mGy). COMPLICATIONS: None immediate. TECHNIQUE: Informed written consent was obtained from the patient after a thorough discussion of the procedural risks, benefits and alternatives. All questions were addressed. Maximal Sterile Barrier  Technique was utilized including caps, mask, sterile gowns, sterile gloves, sterile drape, hand hygiene and skin antiseptic. A timeout was performed prior to the initiation of the procedure. The right forearm to the wrist was prepped and draped in the usual sterile manner. The right radial artery was then identified with ultrasound, and its morphology documented in the radiology PACS system. A dorsal palmar anastomosis was verified to be present. Using ultrasound guidance, a 6/7 radial sheath was then inserted over a 0.018 inch micro guidewire. The obturator, and micro guidewire were removed. Good aspiration was obtained from the side port of the radial sheath. A cocktail of 2000 units of heparin, 200 mcg of nitroglycerin, and 2.5 mg of Versed was then infused through the sheath without complications. A right radial arteriogram was performed. Over an 035 inch Roadrunner guidewire, a 5 French Simmons 2 diagnostic catheter was advanced to the aortic arch region, and selectively positioned just proximal to the origin of the right vertebral artery. An arteriogram was then performed centered extra cranially and intracranially. This was then removed. A combination of a 5.5 French 125 cm Simmons 2 catheter inside of an 072 Armadillo 95 cm guide catheter was then advanced into the aortic arch region and then selectively positioned in the right common carotid artery distally over the 035 inch guidewire and support catheter. The support catheter, and the guidewire were removed. Good aspiration obtained from the hub of the 072 Armadillo guide catheter. An arteriogram was then performed centered extra cranially and intracranially. Also performed was a 3D rotational arteriogram with reformation on a separate workstation of the right anterior circulation via a right internal carotid artery injection. FINDINGS: The origin of the right vertebral artery again demonstrates a high-grade stenosis. However, distal to this the right  vertebral artery ascends normally to the cranial skull base. Wide patency is seen of the right vertebrobasilar junction and the right posterior-inferior cerebellar artery. Distally flow is noted into the distal basilar artery, and the left posterior cerebral artery and the superior cerebellar arteries. Inflow of unopacified blood is seen in the basilar artery from the contralateral vertebral artery. The right common carotid arteriogram demonstrates the origin of the right external carotid artery and its origin to be widely patent. The right internal carotid artery at the bulb to the cranial skull base demonstrates wide patency. The petrous, the cavernous and the supraclinoid segments demonstrate patency as well. A right posterior communicating artery opacifying the right posterior cerebral artery distribution. The right middle cerebral artery and the right anterior cerebral artery opacify into the capillary and venous phases. The right middle cerebral artery trifurcation region demonstrates approximally 2.9 mm x 2.4 mm wide neck saccular aneurysm projecting anteroinferiorly. Also demonstrated is an approximately 3.8 mm x 2.6 mm narrow neck saccular aneurysms arising in the right posterior communicating artery region projecting inferiorly. ENDOVASCULAR OBLITERATION OF RIGHT POSTERIOR COMMUNICATING ARTERY ANEURYSM Through the 072 guide catheter, a 126 cm 6 support catheter was advanced over a 0.035 inch Roadrunner guidewire to the horizontal petrous segment of  the right internal carotid artery. The guidewire was removed. Good aspiration obtained from the hub of the support catheter. A gentle control arteriogram performed through the catheter demonstrates safe positioning of the tip of the catheter without evidence of spasms, dissections or of intraluminal filling defects. The O72 guide catheter was then advanced to the distal right ICA. Using biplane roadmap technique and constant fluoroscopic guidance, in a coaxial  manner and with constant heparinized saline infusion, an 017 2 tip microcatheter with a slight curve was then advanced over a 0.014 inch Aristotle soft tip micro guidewire with a moderate J configuration to the proximal cavernous right ICA. Using a torque device, the micro guidewire was then gently advanced without difficulty into the right posterior communicating artery region aneurysm middle of the fundus followed by the microcatheter without any difficulty. The micro guidewire was then gently retrieved and removed under constant fluoroscopic guidance ensuring no sudden forward motion movement of the intra aneurysmal microcatheter. None was noted. Good aspiration was obtained from the hub of the intra aneurysmal microcatheter which was then connected to continuous heparinized saline infusion. The first coil utilized was a Target Tetra 3 mm x 6 cm coil which was advanced into the intra aneurysmal microcatheter tip. Under constant fluoroscopic guidance, the coil was then introduced into the aneurysm without difficulty. This was then detached. This was then followed by a 2 mm x 3.5 cm Tetra coil, and a 1.5 mm x 2 cm Tetra coil. Prior to the detachment of each coil, an arteriogram was performed ensuring safe positioning of the coil within the aneurysm. A control arteriogram performed following the third coil demonstrated complete obliteration of the aneurysm. Distally, the right anterior and the right middle cerebral artery distribution demonstrated no evidence of intravascular filling defects or of occlusions. The right posterior communicating artery remained widely patent. Under constant fluoroscopic guidance, the microcatheter was then gently retrieved and removed ensuring no movement of the coil mass. None was observed. A final control arteriogram performed following removal of the support catheter, and microcatheter demonstrated the right internal carotid artery extra cranially and intracranially to be widely  patent. More distally, the right middle and the right anterior cerebral artery demonstrated wide patency without evidence of intraluminal filling defects or of occlusions. The 95 cm Armadillo sheath was removed. The 7 French radial sheath was removed. Hemostasis at the right radial puncture site was achieved with a wrist band. Distal right radial pulse was verified to be present. General anesthesia was reversed, and patient was extubated without difficulty. Upon recovery, the patient noted no headaches or nausea or vomiting. She was able to obey simple commands appropriately. She moved all four extremities equally. She was then transferred to the PACU and then neuro ICU. Later in the evening, the patient was more fully awake, alert complaining of generalized ache. This was resolved with IV Toradol, and IV fentanyl. Overnight, the patient remained stable neurologically. The following morning, she complained of no headaches. She did complain of left shoulder stiffness. Right radial puncture site was intact with the distal right radial pulse noted to be present. Neurologically she was nonfocal. She was then ambulated. Was able to take any solids and liquids without difficulty. She was then discharged home under the care of her close friend with instructions to maintain adequate hydration and to continue taking her home medications. She was advised to make an appointment to see her primary care on Monday, to take aspirin 81 mg a day with Plavix 37.5 mg a  day for 7 days then stop. 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 She was also advised to refrain from stooping, bending or lifting weights above 10 pounds for 2 weeks. Patient was advised to refrain from driving for at least 7 days. Questions were answered to her satisfaction. She expressed  understanding and agreement with the above management plan. She was also advised to make an appointment to see her primary care on Monday in regards her pulmonary status. IMPRESSION: 1. 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 2. 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 PLAN: Follow-up in clinic 2 weeks post discharge. Electronically Signed   By: Luanne Bras M.D.   On: 12/27/2021 07:49    Labs:  CBC: Recent Labs    11/08/21 0657 11/25/21 0708 12/25/21 0641 12/26/21 0500  WBC 9.4 12.4* 8.1 8.2  HGB 12.6 12.0 11.7* 10.4*  HCT 38.6 35.6* 36.1 32.3*  PLT 383 420* 319 301    COAGS: Recent Labs    02/10/21 1720 02/11/21 0718 11/08/21 0657 11/25/21 0708 12/25/21 0641 01/20/22 0711  INR 1.0   < > 1.0 0.9 0.9 1.0  APTT 23*  --   --   --   --   --    < > = values in this interval not displayed.    BMP: Recent Labs    11/25/21 0708 12/25/21 0641 12/26/21 0500 01/20/22 0711  NA 137 140 141 139  K 3.0* 4.2 3.4* 4.1  CL 107 108 108 107  CO2 21* 24 24 21*  GLUCOSE 134* 115* 124* 116*  BUN '13 20 11 '$ 28*  CALCIUM 8.9 8.6* 7.9* 9.1  CREATININE 0.79 0.79 0.78 0.85  GFRNONAA >60 >60 >60 >60    LIVER FUNCTION TESTS: Recent Labs    02/20/21 0420 04/22/21 1047 04/24/21 0711 08/21/21 1002  BILITOT 0.6 0.7 0.5 0.2  AST '26 27 19 '$ 41*  ALT 19 81* 43 58*  ALKPHOS 42 86 61 116  PROT 6.1* 7.1 6.2* 6.6  ALBUMIN 3.4* 4.1 3.5 4.6    TUMOR MARKERS: No results for input(s): "AFPTM", "CEA", "CA199", "CHROMGRNA" in the last 8760 hours.  Assessment and Plan: 56 y.o. female with R PCA and R MCA aneurysms s/p coil embo of the R PCA on 6/14, she presents today for R MCA aneurysm treatment with Dr. Estanislado Pandy.   NPO since MN VSS CBC with leukocytosis, elevated neutrophils - Dr. Estanislado Pandy notified, UA ordered.  BMP BUN 28, RF normal otherwise  INR 1.0 She confirms that  she has been taking ASA 81 mg qd and Plavix 37.5 mg qd  as instructed.  Abx ordered  Risks and benefits of cerebral angiogram with intervention were discussed with the patient including, but not limited to bleeding, infection, vascular injury, contrast induced renal failure, stroke or even death.  This interventional procedure involves the use of X-rays and because of the nature of the planned procedure, it is possible that we will have prolonged use of X-ray fluoroscopy.  Potential radiation risks to you include (but are not limited to) the following: - A slightly elevated risk for cancer  several years later in life. This risk is typically less than 0.5% percent. This risk is low in comparison to the normal incidence of  human cancer, which is 33% for women and 50% for men according to the [employer]. - Radiation induced injury can include skin redness, resembling a rash, tissue breakdown / ulcers and hair loss (which can be temporary or permanent).   The likelihood of either of these occurring depends on the difficulty of the procedure and whether you are sensitive to radiation due to previous procedures, disease, or genetic conditions.   IF your procedure requires a prolonged use of radiation, you will be notified and given written instructions for further action.  It is your responsibility to monitor the irradiated area for the 2 weeks following the procedure and to notify your physician if you are concerned that you have suffered a radiation induced injury.    All of the patient's questions were answered, patient is agreeable to proceed.  Consent signed and in chart.   Thank you for this interesting consult.  I greatly enjoyed meeting [place] and look forward to participating in their care.  A copy of this report was sent to the requesting provider on this date.  Electronically Signed: Tera Mater, PA-C 01/20/2022, 7:44 AM   I spent a total of    30 minutes  in face to face in clinical consultation, greater than 50% of which was counseling/coordinating care for R MCA aneurysm.   This chart was dictated using voice recognition software.  Despite best efforts to proofread,  errors can occur which can change the documentation meaning.

## 2022-01-20 ENCOUNTER — Encounter (HOSPITAL_COMMUNITY)
Admission: RE | Disposition: A | Discharge status: Home / Self Care | Payer: Self-pay | Source: Home / Self Care | Attending: Interventional Radiology

## 2022-01-20 ENCOUNTER — Encounter (HOSPITAL_COMMUNITY): Payer: Self-pay

## 2022-01-20 ENCOUNTER — Inpatient Hospital Stay (HOSPITAL_COMMUNITY)
Admission: RE | Admit: 2022-01-20 | Discharge: 2022-01-20 | Disposition: A | Discharge status: Home / Self Care | Payer: Medicare Other | Source: Ambulatory Visit | Attending: Interventional Radiology | Admitting: Interventional Radiology

## 2022-01-20 ENCOUNTER — Ambulatory Visit (HOSPITAL_COMMUNITY)
Admission: RE | Admit: 2022-01-20 | Discharge: 2022-01-20 | Disposition: A | Discharge status: Home / Self Care | Payer: Medicare Other | Attending: Interventional Radiology | Admitting: Interventional Radiology

## 2022-01-20 ENCOUNTER — Encounter (HOSPITAL_COMMUNITY): Payer: Self-pay | Admitting: Interventional Radiology

## 2022-01-20 DIAGNOSIS — Z01812 Encounter for preprocedural laboratory examination: Secondary | ICD-10-CM | POA: Insufficient documentation

## 2022-01-20 DIAGNOSIS — K219 Gastro-esophageal reflux disease without esophagitis: Secondary | ICD-10-CM | POA: Insufficient documentation

## 2022-01-20 DIAGNOSIS — I671 Cerebral aneurysm, nonruptured: Secondary | ICD-10-CM | POA: Insufficient documentation

## 2022-01-20 DIAGNOSIS — I2510 Atherosclerotic heart disease of native coronary artery without angina pectoris: Secondary | ICD-10-CM | POA: Insufficient documentation

## 2022-01-20 DIAGNOSIS — I10 Essential (primary) hypertension: Secondary | ICD-10-CM | POA: Insufficient documentation

## 2022-01-20 DIAGNOSIS — Z87891 Personal history of nicotine dependence: Secondary | ICD-10-CM | POA: Insufficient documentation

## 2022-01-20 DIAGNOSIS — E785 Hyperlipidemia, unspecified: Secondary | ICD-10-CM | POA: Insufficient documentation

## 2022-01-20 DIAGNOSIS — G4733 Obstructive sleep apnea (adult) (pediatric): Secondary | ICD-10-CM | POA: Insufficient documentation

## 2022-01-20 DIAGNOSIS — D72829 Elevated white blood cell count, unspecified: Secondary | ICD-10-CM | POA: Diagnosis not present

## 2022-01-20 DIAGNOSIS — Z01818 Encounter for other preprocedural examination: Secondary | ICD-10-CM

## 2022-01-20 DIAGNOSIS — F319 Bipolar disorder, unspecified: Secondary | ICD-10-CM | POA: Insufficient documentation

## 2022-01-20 DIAGNOSIS — Z7902 Long term (current) use of antithrombotics/antiplatelets: Secondary | ICD-10-CM | POA: Insufficient documentation

## 2022-01-20 DIAGNOSIS — E119 Type 2 diabetes mellitus without complications: Secondary | ICD-10-CM | POA: Insufficient documentation

## 2022-01-20 DIAGNOSIS — Z79899 Other long term (current) drug therapy: Secondary | ICD-10-CM | POA: Insufficient documentation

## 2022-01-20 DIAGNOSIS — H409 Unspecified glaucoma: Secondary | ICD-10-CM | POA: Insufficient documentation

## 2022-01-20 DIAGNOSIS — Z7982 Long term (current) use of aspirin: Secondary | ICD-10-CM | POA: Insufficient documentation

## 2022-01-20 HISTORY — DX: Unspecified osteoarthritis, unspecified site: M19.90

## 2022-01-20 LAB — CBC WITH DIFFERENTIAL/PLATELET
Abs Immature Granulocytes: 0.12 10*3/uL — ABNORMAL HIGH (ref 0.00–0.07)
Basophils Absolute: 0.1 10*3/uL (ref 0.0–0.1)
Basophils Relative: 0 %
Eosinophils Absolute: 0.1 10*3/uL (ref 0.0–0.5)
Eosinophils Relative: 1 %
HCT: 32.4 % — ABNORMAL LOW (ref 36.0–46.0)
Hemoglobin: 10.6 g/dL — ABNORMAL LOW (ref 12.0–15.0)
Immature Granulocytes: 1 %
Lymphocytes Relative: 26 %
Lymphs Abs: 3.9 10*3/uL (ref 0.7–4.0)
MCH: 29.5 pg (ref 26.0–34.0)
MCHC: 32.7 g/dL (ref 30.0–36.0)
MCV: 90.3 fL (ref 80.0–100.0)
Monocytes Absolute: 1.7 10*3/uL — ABNORMAL HIGH (ref 0.1–1.0)
Monocytes Relative: 12 %
Neutro Abs: 8.9 10*3/uL — ABNORMAL HIGH (ref 1.7–7.7)
Neutrophils Relative %: 60 %
Platelets: 342 10*3/uL (ref 150–400)
RBC: 3.59 MIL/uL — ABNORMAL LOW (ref 3.87–5.11)
RDW: 14.1 % (ref 11.5–15.5)
WBC: 14.8 10*3/uL — ABNORMAL HIGH (ref 4.0–10.5)
nRBC: 0 % (ref 0.0–0.2)

## 2022-01-20 LAB — BASIC METABOLIC PANEL
Anion gap: 11 (ref 5–15)
BUN: 28 mg/dL — ABNORMAL HIGH (ref 6–20)
CO2: 21 mmol/L — ABNORMAL LOW (ref 22–32)
Calcium: 9.1 mg/dL (ref 8.9–10.3)
Chloride: 107 mmol/L (ref 98–111)
Creatinine, Ser: 0.85 mg/dL (ref 0.44–1.00)
GFR, Estimated: >60 mL/min (ref >60)
Glucose, Bld: 116 mg/dL — ABNORMAL HIGH (ref 70–99)
Potassium: 4.1 mmol/L (ref 3.5–5.1)
Sodium: 139 mmol/L (ref 135–145)

## 2022-01-20 LAB — URINALYSIS, COMPLETE (UACMP) WITH MICROSCOPIC
Bilirubin Urine: NEGATIVE
Glucose, UA: NEGATIVE mg/dL
Hgb urine dipstick: NEGATIVE
Ketones, ur: NEGATIVE mg/dL
Leukocytes,Ua: NEGATIVE
Nitrite: NEGATIVE
Protein, ur: NEGATIVE mg/dL
Specific Gravity, Urine: 1.029 (ref 1.005–1.030)
pH: 5 (ref 5.0–8.0)

## 2022-01-20 LAB — GLUCOSE, CAPILLARY: Glucose-Capillary: 120 mg/dL — ABNORMAL HIGH (ref 70–99)

## 2022-01-20 LAB — PROTIME-INR
INR: 1 (ref 0.8–1.2)
Prothrombin Time: 12.6 seconds (ref 11.4–15.2)

## 2022-01-20 SURGERY — IR WITH ANESTHESIA
Anesthesia: General

## 2022-01-20 MED ORDER — INSULIN ASPART 100 UNIT/ML IJ SOLN: 0.0000 [IU] | INTRAMUSCULAR | Status: DC | PRN

## 2022-01-20 MED ORDER — VANCOMYCIN HCL 1000 MG IV SOLR
1000.0000 mg | INTRAVENOUS | Status: AC
  Administered 2022-01-20: 1000 mg via INTRAVENOUS
  Filled 2022-01-20: qty 20

## 2022-01-20 MED ORDER — VANCOMYCIN HCL IN DEXTROSE 1-5 GM/200ML-% IV SOLN
INTRAVENOUS | Status: AC
  Filled 2022-01-20: qty 200

## 2022-01-20 MED ORDER — NIMODIPINE 30 MG PO CAPS: 0.0000 mg | ORAL_CAPSULE | ORAL | Status: DC

## 2022-01-20 MED ORDER — SODIUM CHLORIDE 0.9 % IV SOLN: INTRAVENOUS | Status: DC

## 2022-01-20 MED ORDER — CHLORHEXIDINE GLUCONATE 0.12 % MT SOLN
OROMUCOSAL | Status: AC
  Administered 2022-01-20: 15 mL via OROMUCOSAL
  Filled 2022-01-20: qty 15

## 2022-01-20 MED ORDER — CHLORHEXIDINE GLUCONATE 0.12 % MT SOLN
15.0000 mL | OROMUCOSAL | Status: AC
  Filled 2022-01-20: qty 15

## 2022-01-20 NOTE — Anesthesia Procedure Notes (Signed)
Arterial Line Insertion Start/End7/04/2022 7:15 AM, 01/20/2022 7:40 AM Performed by: Lance Coon, CRNA, CRNA  Preanesthetic checklist: patient identified, IV checked, site marked, risks and benefits discussed, surgical consent, monitors and equipment checked, pre-op evaluation, timeout performed and anesthesia consent Lidocaine 1% used for infiltration Left, radial was placed Catheter size: 20 G Hand hygiene performed , maximum sterile barriers used  and Seldinger technique used  Attempts: 2 Procedure performed without using ultrasound guided technique. Following insertion, dressing applied and Biopatch. Post procedure assessment: normal and unchanged  Patient tolerated the procedure well with no immediate complications.

## 2022-01-20 NOTE — Progress Notes (Signed)
Dr. Estanislado Pandy cancelled procedure today for elevated WBCs. Collected UA per order. Dc'd patient to home with family.

## 2022-01-20 NOTE — Progress Notes (Signed)
Patient presented to [facility] Neuro Interventional Radiology department for an image-guided diagnostic cerebral angiogram with possible intervention of the right MCA aneurysm. Patient found to have an elevated WBC of 14.8 with elevated absolute neutrophils. The procedure has been cancelled and the patient has been advised to follow up with her PCP for further evaluation. Dr. Estanislado Pandy will follow up with the patient at a later date to reschedule this procedure.  A UA was ordered - no acute findings.   Soyla Dryer, [place] [phone] 01/20/2022, 10:13 AM

## 2022-01-23 ENCOUNTER — Ambulatory Visit (INDEPENDENT_AMBULATORY_CARE_PROVIDER_SITE_OTHER): Payer: Medicare Other | Admitting: Internal Medicine

## 2022-01-23 ENCOUNTER — Encounter: Payer: Self-pay | Admitting: Internal Medicine

## 2022-01-23 ENCOUNTER — Other Ambulatory Visit (HOSPITAL_COMMUNITY)
Admission: RE | Admit: 2022-01-23 | Discharge: 2022-01-23 | Disposition: A | Discharge status: Home / Self Care | Payer: Medicare Other | Source: Ambulatory Visit | Attending: Internal Medicine | Admitting: Internal Medicine

## 2022-01-23 VITALS — BP 130/80 | HR 74 | Temp 98.4°F | Resp 14 | Ht 63.0 in | Wt 198.0 lb

## 2022-01-23 DIAGNOSIS — M25511 Pain in right shoulder: Secondary | ICD-10-CM | POA: Diagnosis not present

## 2022-01-23 DIAGNOSIS — N760 Acute vaginitis: Secondary | ICD-10-CM | POA: Diagnosis present

## 2022-01-23 DIAGNOSIS — D72829 Elevated white blood cell count, unspecified: Secondary | ICD-10-CM | POA: Diagnosis not present

## 2022-01-23 DIAGNOSIS — N3000 Acute cystitis without hematuria: Secondary | ICD-10-CM

## 2022-01-23 DIAGNOSIS — L989 Disorder of the skin and subcutaneous tissue, unspecified: Secondary | ICD-10-CM | POA: Diagnosis not present

## 2022-01-23 DIAGNOSIS — Z1283 Encounter for screening for malignant neoplasm of skin: Secondary | ICD-10-CM

## 2022-01-23 DIAGNOSIS — Z113 Encounter for screening for infections with a predominantly sexual mode of transmission: Secondary | ICD-10-CM | POA: Diagnosis present

## 2022-01-23 DIAGNOSIS — R197 Diarrhea, unspecified: Secondary | ICD-10-CM

## 2022-01-23 DIAGNOSIS — L811 Chloasma: Secondary | ICD-10-CM

## 2022-01-23 DIAGNOSIS — Z23 Encounter for immunization: Secondary | ICD-10-CM

## 2022-01-23 DIAGNOSIS — M25512 Pain in left shoulder: Secondary | ICD-10-CM

## 2022-01-23 MED ORDER — TETANUS-DIPHTH-ACELL PERTUSSIS 5-2.5-18.5 LF-MCG/0.5 IM SUSP: 0.5000 mL | Freq: Once | INTRAMUSCULAR | 0 refills | Status: AC

## 2022-01-23 NOTE — Patient Instructions (Addendum)
Latest Reference Range & Units 10/25/21 15:17  Hemoglobin A1C 4.0 - 5.6 % 6.3 !  !: Data is abnormal Prediabetic   Assessment and plan- Patient is a 56 y.o. female here for routine follow-up of leukocytosis and thrombocytosis:    Both leukocytosis and thrombocytosis waxes and wanes.  Today her white count is normal at 9.3 and platelets mildly elevated at 475.  Myeloproliferative work-up in the past has been otherwise unremarkable.  No indication for bone marrow biopsy at this time.  Anemia significantly improved and hemoglobin is normal at 14.1.   Easy bruising: Work-up for bleeding disorder was otherwise negative.  She is also on Plavix.  Continue to monitor.  CBC with differential in 6 months in 1 year and I will see her back in 1 year   Visit Diagnosis 1. Leukocytosis, unspecified type     cardiology 1.[place] [address] 3.9 [phone])  Cardiologist [address]  [address]  [phone] Open ? Closes 5?PM  Has online care Website Directions  2.  [place] 4.5 [phone])  Cardiologist [address]  [place] [address]  [phone] Open ? Closes 5?PM  3. "Thank you for allowing to see my team here." Website Directions Veneta Penton, M.D. 5.0 (3)  Cardiologist [address]  [phone] Medicare/Medicaid accepted  [place] 4.0 [id number])  Medical clinic [address]  [address]  [phone] Open ? Closes 5?PM Medicaid accepted [place] 4.2 (5)  Medical clinic [address]  [place] [address]  [phone] Open ? Closes 5?PM  Has online care   Dr. Randa Evens, MD, MPH [facility] [phone] 08/29/2021 9:25 AM   Dermatology   [phone] [address] [place], [place] [address]   Prediabetes Eating Plan Prediabetes is a condition that causes blood sugar (glucose) levels to be higher than normal. This increases the risk for developing type 2 diabetes (type 2 diabetes mellitus). Working with a health care provider or nutrition specialist (dietitian) to make diet and lifestyle changes can help prevent the onset of diabetes. These changes may help you: Control your blood glucose levels. Improve your cholesterol levels. Manage your blood pressure. What are tips for following this plan? Reading food labels Read food labels to check the amount of fat, salt (sodium), and sugar in prepackaged foods. Avoid foods that have: Saturated fats. Trans fats. Added sugars. Avoid foods that have more than 300 milligrams (mg) of sodium per serving. Limit your sodium intake to less than 2,300 mg each day. Shopping Avoid buying pre-made and processed foods. Avoid buying drinks with added sugar. Cooking Cook with olive oil. Do not use butter, lard, or ghee. Bake, broil, grill, steam, or boil foods. Avoid frying. Meal planning  Work with your dietitian to create an eating plan that is right for you. This may include tracking how many calories you take in each day. Use a food diary, notebook, or mobile application to track what you eat at each meal. Consider following a Mediterranean diet. This includes: Eating several servings of fresh fruits and vegetables each day. Eating fish at least twice a week. Eating one serving each day of whole grains, beans, nuts, and seeds. Using olive oil instead of other  fats. Limiting alcohol. Limiting red meat. Using nonfat or low-fat dairy products. Consider following a plant-based diet. This includes dietary choices that focus on eating mostly vegetables and fruit, grains, beans, nuts, and seeds. If you have high blood pressure, you may need to limit your sodium intake or follow a diet such as the DASH (Dietary  Approaches to Stop Hypertension) eating plan. The DASH diet aims to lower high blood pressure. Lifestyle Set weight loss goals with help from your health care team. It is recommended that most people with prediabetes lose 7% of their body weight. Exercise for at least 30 minutes 5 or more days a week. Attend a support group or seek support from a mental health counselor. Take over-the-counter and prescription medicines only as told by your health care provider. What foods are recommended? Fruits Berries. Bananas. Apples. Oranges. Grapes. Papaya. Mango. Pomegranate. Kiwi. Grapefruit. Cherries. Vegetables Lettuce. Spinach. Peas. Beets. Cauliflower. Cabbage. Broccoli. Carrots. Tomatoes. Squash. Eggplant. Herbs. Peppers. Onions. Cucumbers. Brussels sprouts. Grains Whole grains, such as whole-wheat or whole-grain breads, crackers, cereals, and pasta. Unsweetened oatmeal. Bulgur. Barley. Quinoa. Brown rice. Corn or whole-wheat flour tortillas or taco shells. Meats and other proteins Seafood. Poultry without skin. Lean cuts of pork and beef. Tofu. Eggs. Nuts. Beans. Dairy Low-fat or fat-free dairy products, such as yogurt, cottage cheese, and cheese. Beverages Water. Tea. Coffee. Sugar-free or diet soda. Seltzer water. Low-fat or nonfat milk. Milk alternatives, such as soy or almond milk. Fats and oils Olive oil. Canola oil. Sunflower oil. Grapeseed oil. Avocado. Walnuts. Sweets and desserts Sugar-free or low-fat pudding. Sugar-free or low-fat ice cream and other frozen treats. Seasonings and condiments Herbs. Sodium-free spices. Mustard. Relish. Low-salt, low-sugar ketchup. Low-salt, low-sugar barbecue sauce. Low-fat or fat-free mayonnaise. The items listed above may not be a complete list of recommended foods and beverages. Contact a dietitian for more information. What foods are not recommended? Fruits Fruits canned with syrup. Vegetables Canned vegetables. Frozen vegetables with butter or  cream sauce. Grains Refined white flour and flour products, such as bread, pasta, snack foods, and cereals. Meats and other proteins Fatty cuts of meat. Poultry with skin. Breaded or fried meat. Processed meats. Dairy Full-fat yogurt, cheese, or milk. Beverages Sweetened drinks, such as iced tea and soda. Fats and oils Butter. Lard. Ghee. Sweets and desserts Baked goods, such as cake, cupcakes, pastries, cookies, and cheesecake. Seasonings and condiments Spice mixes with added salt. Ketchup. Barbecue sauce. Mayonnaise. The items listed above may not be a complete list of foods and beverages that are not recommended. Contact a dietitian for more information. Where to find more information American Diabetes Association: www.diabetes.org Summary You may need to make diet and lifestyle changes to help prevent the onset of diabetes. These changes can help you control blood sugar, improve cholesterol levels, and manage blood pressure. Set weight loss goals with help from your health care team. It is recommended that most people with prediabetes lose 7% of their body weight. Consider following a Mediterranean diet. This includes eating plenty of fresh fruits and vegetables, whole grains, beans, nuts, seeds, fish, and low-fat dairy, and using olive oil instead of other fats. This information is not intended to replace advice given to you by your health care provider. Make sure you discuss any questions you have with your health care provider. Document Revised: 09/29/2019 Document Reviewed: 09/29/2019 Elsevier Patient Education  [place].  Gastroparesis  Gastroparesis is a condition in which food takes longer than normal to empty from the  stomach. This condition is also known as delayed gastric emptying. It is usually a long-term (chronic) condition. There is no cure, but there are treatments and things that you can do at home to help relieve symptoms. Treating the underlying condition  that causes gastroparesis can also help relieve symptoms. What are the causes? In many cases, the cause of this condition is not known. Possible causes include: A hormone (endocrine) disorder, such as hypothyroidism or diabetes. A nervous system disease, such as Parkinson's disease or multiple sclerosis. Cancer, infection, or surgery that affects the stomach or vagus nerve. The vagus nerve runs from your chest, through your neck, and to the lower part of your brain. A connective tissue disorder, such as scleroderma. Certain medicines. What increases the risk? You are more likely to develop this condition if: You have certain disorders or diseases. These may include: An endocrine disorder. An eating disorder. Amyloidosis. Scleroderma. Parkinson's disease. Multiple sclerosis. Cancer or infection of the stomach or the vagus nerve. You have had surgery on your stomach or vagus nerve. You take certain medicines. You are female. What are the signs or symptoms? Symptoms of this condition include: Feeling full after eating very little or a loss of appetite. Nausea, vomiting, or heartburn. Bloating of your abdomen. Inconsistent blood sugar (glucose) levels on blood tests. Unexplained weight loss. Acid from the stomach coming up into the esophagus (gastroesophageal reflux). Sudden tightening (spasm) of the stomach, which can be painful. Symptoms may come and go. Some people may not notice any symptoms. How is this diagnosed? This condition is diagnosed with tests, such as: Tests that check how long it takes food to move through the stomach and intestines. These tests include: Upper gastrointestinal (GI) series. For this test, you drink a liquid that shows up well on X-rays, and then X-rays are taken of your intestines. Gastric emptying scintigraphy. For this test, you eat food that contains a small amount of radioactive material, and then scans are taken. Wireless capsule GI monitoring  system. For this test, you swallow a pill (capsule) that records information about how foods and fluid move through your stomach. Gastric manometry. For this test, a tube is passed down your throat and into your stomach to measure electrical and muscular activity. Endoscopy. For this test, a long, thin tube with a camera and light on the end is passed down your throat and into your stomach to check for problems in your stomach lining. Ultrasound. This test uses sound waves to create images of the inside of your body. This can help rule out gallbladder disease or pancreatitis as a cause of your symptoms. How is this treated? There is no cure for this condition, but treatment and home care may relieve symptoms. Treatment may include: Treating the underlying cause. Managing your symptoms by making changes to your diet and exercise habits. Taking medicines to control nausea and vomiting and to stimulate stomach muscles. Getting food through a feeding tube in the hospital. This may be done in severe cases. Having surgery to insert a device called a gastric electrical stimulator into your body. This device helps improve stomach emptying and control nausea and vomiting. Follow these instructions at home: Take over-the-counter and prescription medicines only as told by your health care provider. Follow instructions from your health care provider about eating or drinking restrictions. Your health care provider may recommend that you: Eat smaller meals more often. Eat low-fat foods. Eat low-fiber forms of high-fiber foods. For example, eat cooked vegetables instead of raw  vegetables. Have only liquid foods instead of solid foods. Liquid foods are easier to digest. Drink enough fluid to keep your urine pale yellow. Exercise as often as told by your health care provider. Keep all follow-up visits. This is important. Contact a health care provider if you: Notice that your symptoms do not improve with  treatment. Have new symptoms. Get help right away if you: Have severe pain in your abdomen that does not improve with treatment. Have nausea that is severe or does not go away. Vomit every time you drink fluids. Summary Gastroparesis is a long-term (chronic) condition in which food takes longer than normal to empty from the stomach. Symptoms include nausea, vomiting, heartburn, bloating of your abdomen, and loss of appetite. Eating smaller portions, low-fat foods, and low-fiber forms of high-fiber foods may help you manage your symptoms. Get help right away if you have severe pain in your abdomen. This information is not intended to replace advice given to you by your health care provider. Make sure you discuss any questions you have with your health care provider. Document Revised: 11/07/2019 Document Reviewed: 11/07/2019 Elsevier Patient Education  [place].  Pneumococcal Conjugate Vaccine (Prevnar 20) Suspension for Injection What is this medication? PNEUMOCOCCAL VACCINE (NEU mo KOK al vak SEEN) is a vaccine. It prevents pneumococcus bacterial infections. These bacteria can cause serious infections like pneumonia, meningitis, and blood infections. This vaccine will not treat an infection and will not cause infection. This vaccine is recommended for adults 18 years and older. This medicine may be used for other purposes; ask your health care provider or pharmacist if you have questions. COMMON BRAND NAME(S): Prevnar 20 What should I tell my care team before I take this medication? They need to know if you have any of these conditions: bleeding disorder fever immune system problems an unusual or allergic reaction to pneumococcal vaccine, diphtheria toxoid, other vaccines, other medicines, foods, dyes, or preservatives pregnant or trying to get pregnant breast-feeding How should I use this medication? This vaccine is injected into a muscle. It is given by a health care  provider. A copy of Vaccine Information Statements will be given before each vaccination. Be sure to read this information carefully each time. This sheet may change often. Talk to your health care provider about the use of this medicine in children. Special care may be needed. Overdosage: If you think you have taken too much of this medicine contact a poison control center or emergency room at once. NOTE: This medicine is only for you. Do not share this medicine with others. What if I miss a dose? This does not apply. This medicine is not for regular use. What may interact with this medication? medicines for cancer chemotherapy medicines that suppress your immune function steroid medicines like prednisone or cortisone This list may not describe all possible interactions. Give your health care provider a list of all the medicines, herbs, non-prescription drugs, or dietary supplements you use. Also tell them if you smoke, drink alcohol, or use illegal drugs. Some items may interact with your medicine. What should I watch for while using this medication? Mild fever and pain should go away in 3 days or less. Report any unusual symptoms to your health care provider. What side effects may I notice from receiving this medication? Side effects that you should report to your doctor or health care professional as soon as possible: allergic reactions (skin rash, itching or hives; swelling of the face, lips, or tongue) confusion fast,  irregular heartbeat fever over 102 degrees F muscle weakness seizures trouble breathing unusual bruising or bleeding Side effects that usually do not require medical attention (report to your doctor or health care professional if they continue or are bothersome): fever of 102 degrees F or less headache joint pain muscle cramps, pain pain, tender at site where injected This list may not describe all possible side effects. Call your doctor for medical advice about side  effects. You may report side effects to FDA at 1-800-FDA-1088. Where should I keep my medication? This vaccine is only given by a health care provider. It will not be stored at home. NOTE: This sheet is a summary. It may not cover all possible information. If you have questions about this medicine, talk to your doctor, pharmacist, or health care provider.  2023 Elsevier/Gold Standard (2020-03-15 00:00:00)   Melasma Melasma is a skin condition that causes areas of darker coloring. It usually appears in patches on the cheeks, forehead, upper lip, and neck. These patches can look like a mask. The discolored areas do not itch and are not red or swollen. Melasma is not contagious. This means that it does not spread from person to person. What are the causes? The cause of this condition is not known. However, it can be started by certain triggers, such as: Being out in the sun. Allergies to medicines or cosmetics, such as makeup or face creams. Changes in your hormones. These may result from: Taking birth control medicines. Taking hormone replacement therapy. Being pregnant. What increases the risk? The following factors may make you more likely to develop this condition: Being a woman. Melasma is less common in men. Having a family history of melasma. Having darker skin. Living in a tropical climate. What are the signs or symptoms? The only sign of this condition is dark or tan patches on the skin. How is this diagnosed? This condition is diagnosed based on: A physical exam. Your health care provider will examine the physical appearance of your skin. He or she may use a special light, called a Wood lamp, to look more closely at your skin. Biopsy. A small sample of your skin is removed and looked at under a microscope. This is done to make sure your melasma is not caused by another skin condition, such as skin cancer. How is this treated? There is no cure for this condition. However, there  are treatments that may lighten the color of the darker patches. Treatment may include: Medicines, such as bleaching or lightening creams. Facial chemical peels. Laser treatment. Dermabrasion or microdermabrasion. These procedures use fine instruments to scrape and remove the outer layer of skin to help you grow new, healthy-looking skin. Your melasma may also go away on its own over time. Follow these instructions at home:  Lifestyle Avoid too much exposure to the sun, especially in tropical areas. Wear sunscreen with an SPF of 30 or higher every day. Wear a hat that protects your face from the sun. Use gentle cosmetics that are meant for sensitive skin. Do not use wax to remove excess hair in areas where you have or have had melasma. General instructions Take or apply over-the-counter and prescription medicines only as told by your health care provider. Keep all follow-up visits. This is important. Contact a health care provider if: You have new symptoms. Your symptoms get worse. Your affected skin areas are bleeding or irritated. Summary Melasma is a skin condition that causes areas of darker coloring that do not itch  and are not red or swollen. The cause of this condition is not known. However, it can be started by certain triggers, such as being out in the sun, allergies to medicines or cosmetics, or changes in your hormones. Risk factors include being a woman, having a family history of melasma, having darker skin, or living in a tropical climate. There is no cure for this condition. However, there are treatments that may lighten the color of the darker patches. Treatment may include medicines, facial chemical peels, laser treatment, dermabrasion, or microdermabrasion. This information is not intended to replace advice given to you by your health care provider. Make sure you discuss any questions you have with your health care provider. Document Revised: 12/19/2019 Document Reviewed:  12/19/2019 Elsevier Patient Education  [place].

## 2022-01-23 NOTE — Progress Notes (Signed)
Chief Complaint  Patient presents with   Elevated WBC    Was sch for procedure on 7/10 but was cancelled due to elevated wbc's. MD stated she needed to f/u with PCP to be given rx for infection. Pt denies any sx's, still having sulfur burps   F/u  1. C/o diarrhea and sulfur burps will have pt f/u with D.r Marius Ditch pt is c/w infection  2. Elevated wbc and Dr. Estanislado Pandy was going to do another embolization procedure for aneurysm 02/10/22 but advised pt to f/u with PCP elevated WBC she is also established with h/o Dr. Janese Banks  Also scheduled EGD Dr. Marius Ditch but this has been pushed back  3. Chronic pain seen ortho and pain clinic in [place] given tramadol 50 mg 1-2 tabs Q8 hours prn  4. Wants derm referral also has rectal lesion ? Wart refer to dermatology check brown spots on face    Review of Systems  Constitutional:  Negative for weight loss.  HENT:  Negative for hearing loss.   Eyes:  Negative for blurred vision.  Respiratory:  Negative for shortness of breath.   Cardiovascular:  Negative for chest pain.  Gastrointestinal:  Positive for abdominal pain, diarrhea and nausea. Negative for blood in stool.  Genitourinary:  Negative for dysuria.  Musculoskeletal:  Negative for falls and joint pain.  Skin:  Negative for rash.  Neurological:  Negative for headaches.  Psychiatric/Behavioral:  Negative for depression.    Past Medical History:  Diagnosis Date   Anxiety    Arthritis    Bilateral shoulders   Asthma    Bipolar 1 disorder ([place])    Bipolar disorder ([place])    CAD (coronary artery disease)    s/p stent BMS OM Cx   Cervical herniated disc 04/12/2016   COPD (chronic obstructive pulmonary disease) ([place])    Depression    Diabetes mellitus with gastroparesis (HCC)    Diabetes mellitus without complication (HCC)    no meds, diet controlled   Diverticulitis    GERD (gastroesophageal reflux disease)    Glaucoma    History of blood transfusion    Hyperlipidemia    Hypertension    OSA  (obstructive sleep apnea)    not using cpap    Plantar fasciitis    b/l feet s/p steroid shots w/o help and left surgery w/o help    PONV (postoperative nausea and vomiting)    Spinal headache    UTI (urinary tract infection)    Past Surgical History:  Procedure Laterality Date   ABDOMINAL HYSTERECTOMY     ABDOMINAL SURGERY  1995   Bowel resection.   CARDIAC CATHETERIZATION N/A 04/14/2016   Procedure: Left Heart Cath and Coronary Angiography;  Surgeon: Burnell Blanks, MD;  Location: [facility];  Service: Cardiovascular;  Laterality: N/A;   CARDIAC SURGERY     COLONOSCOPY WITH PROPOFOL N/A 07/11/2019   Procedure: COLONOSCOPY WITH PROPOFOL;  Surgeon: Jonathon Bellows, MD;  Location: [facility] ENDOSCOPY;  Service: Gastroenterology;  Laterality: N/A;   COLONOSCOPY WITH PROPOFOL N/A 07/29/2019   Procedure: COLONOSCOPY WITH PROPOFOL;  Surgeon: Jonathon Bellows, MD;  Location: [facility] ENDOSCOPY;  Service: Gastroenterology;  Laterality: N/A;   CORONARY ANGIOPLASTY WITH STENT PLACEMENT  2010   Drug eluting stent   ESOPHAGOGASTRODUODENOSCOPY (EGD) WITH PROPOFOL N/A 07/11/2019   Procedure: ESOPHAGOGASTRODUODENOSCOPY (EGD) WITH PROPOFOL;  Surgeon: Jonathon Bellows, MD;  Location[facility] END[facility]COPY;  Service: Gastroenterology;  Laterality: N/A;   IR 3D INDEPENDENT WKST  11/08/2021   IR 3D INDEPENDENT  WKST  12/25/2021   IR ANGIO INTRA EXTRACRAN SEL COM CAROTID INNOMINATE BILAT MOD SED  11/08/2021   IR ANGIO INTRA EXTRACRAN SEL COM CAROTID INNOMINATE UNI R MOD SED  12/25/2021   IR ANGIO VERTEBRAL SEL VERTEBRAL BILAT MOD SED  11/08/2021   IR ANGIOGRAM FOLLOW UP STUDY  12/25/2021   IR ANGIOGRAM FOLLOW UP STUDY  12/25/2021   IR ANGIOGRAM FOLLOW UP STUDY  12/25/2021   IR NEURO EACH ADD'L AFTER BASIC UNI RIGHT (MS)  12/25/2021   IR RADIOLOGIST EVAL & MGMT  10/16/2021   IR TRANSCATH/EMBOLIZ  12/25/2021   IR US GUIDE VASC ACCESS RIGHT  11/08/2021   IR US GUIDE VASC ACCESS RIGHT  12/25/2021   LEFT HEART CATH AND CORONARY  ANGIOGRAPHY Left 09/11/2020   Procedure: LEFT HEART CATH AND CORONARY ANGIOGRAPHY;  Surgeon: Nelva Bush, MD;  Location: [facility];  Service: Cardiovascular;  Laterality: Left;   OVARIAN CYST REMOVAL     RADIOLOGY WITH ANESTHESIA N/A 11/08/2021   Procedure: Cyril Loosen;  Surgeon: Luanne Bras, MD;  Location: [place];  Service: Radiology;  Laterality: N/A;   RADIOLOGY WITH ANESTHESIA N/A 12/25/2021   Procedure: EMBOLIZATION;  Surgeon: Luanne Bras, MD;  Location: [place];  Service: Radiology;  Laterality: N/A;   Family History  Problem Relation Age of Onset   CAD Mother    Depression Mother    Heart disease Mother    Hyperlipidemia Mother    Hypertension Mother    Heart disease Father    Alcohol abuse Father    Cancer Brother 47       brain   CAD Brother    Depression Brother    Diabetes Brother    Heart disease Brother    Hyperlipidemia Brother    Lupus Other    Sickle cell anemia Other    Social History   Socioeconomic History   Marital status: Single    Spouse name: Not on file   Number of children: 1   Years of education: Not on file   Highest education level: Not on file  Occupational History   Not on file  Tobacco Use   Smoking status: Former    Packs/day: 1.00    Years: 30.00    Total pack years: 30.00    Types: Cigarettes    Quit date: 12/31/2020    Years since quitting: 1.0   Smokeless tobacco: Never   Tobacco comments:    Quit after being admitted on 6/20 01/17/2021  Vaping Use   Vaping Use: Former  Substance and Sexual Activity   Alcohol use: Yes    Comment: once a year   Drug use: Yes    Types: Marijuana    Comment: Daily   Sexual activity: Not Currently  Other Topics Concern   Not on file  Social History Narrative   From [place] now living in [place]    1 son    No guns    Wears seat belt   Safe in relationship    Social Determinants of Health   Financial Resource Strain: Low Risk  (10/16/2021)   Overall  Financial Resource Strain (CARDIA)    Difficulty of Paying Living Expenses: Not very hard  Food Insecurity: No Food Insecurity (10/16/2021)   Hunger Vital Sign    Worried About Running Out of Food in the Last Year: Never true    Ran Out of Food in the Last Year: Never true  Transportation Needs: No Transportation Needs (  10/16/2021)   PRAPARE - Hydrologist (Medical): No    Lack of Transportation (Non-Medical): No  Physical Activity: Not on file  Stress: No Stress Concern Present (10/16/2021)   [place]    Feeling of Stress : Not at all  Social Connections: Unknown (10/16/2021)   Social Connection and Isolation Panel [NHANES]    Frequency of Communication with Friends and Family: More than three times a week    Frequency of Social Gatherings with Friends and Family: More than three times a week    Attends Religious Services: Not on file    Active Member of Clubs or Organizations: Not on file    Attends Archivist Meetings: Not on file    Marital Status: Not on file  Intimate Partner Violence: Not At Risk (10/16/2021)   Humiliation, Afraid, Rape, and Kick questionnaire    Fear of Current or Ex-Partner: No    Emotionally Abused: No    Physically Abused: No    Sexually Abused: No   Current Meds  Medication Sig   albuterol (PROAIR HFA) 108 (90 Base) MCG/ACT inhaler Inhale 1-2 puffs into the lungs every 4 (four) hours as needed for wheezing or shortness of breath. (Patient taking differently: Inhale 1-2 puffs into the lungs 2 (two) times daily.)   alum & mag hydroxide-simeth (MAALOX/MYLANTA) 200-200-20 MG/5ML suspension Take 30 mLs by mouth every 6 (six) hours as needed (Sulfa Burps).   amLODipine (NORVASC) 10 MG tablet TAKE 1 TABLET BY MOUTH AT  BEDTIME   aspirin 81 MG chewable tablet Chew 81 mg by mouth daily.   atorvastatin (LIPITOR) 80 MG tablet TAKE 1 TABLET BY MOUTH  DAILY AT 6 PM.    baclofen (LIORESAL) 10 MG tablet Take 10 mg by mouth 2 (two) times daily.   bismuth subsalicylate (PEPTO BISMOL) 262 MG/15ML suspension Take 30 mLs by mouth every 6 (six) hours as needed (Sulfa burps).   Budeson-Glycopyrrol-Formoterol (BREZTRI AEROSPHERE) 160-9-4.8 MCG/ACT AERO Inhale 2 puffs into the lungs in the morning and at bedtime. Rinse   clopidogrel (PLAVIX) 75 MG tablet TAKE 1 TABLET BY MOUTH  DAILY (Patient taking differently: Take 37.5 mg by mouth daily.)   dicyclomine (BENTYL) 20 MG tablet Take 1 tablet (20 mg total) by mouth 4 (four) times daily -  before meals and at bedtime. (Patient taking differently: Take 20 mg by mouth daily as needed (Vomiting).)   divalproex (DEPAKOTE) 500 MG DR tablet TAKE 1 TABLET BY MOUTH  TWICE DAILY   ezetimibe (ZETIA) 10 MG tablet TAKE 1 TABLET BY MOUTH  DAILY WITH LIPITOR 80 MG AT NIGHT   fluticasone (FLONASE) 50 MCG/ACT nasal spray Place 2 sprays into both nostrils daily as needed for allergies or rhinitis. (Patient taking differently: Place 2 sprays into both nostrils daily.)   gabapentin (NEURONTIN) 300 MG capsule Take 300 mg by mouth at bedtime.   ipratropium (ATROVENT) 0.06 % nasal spray USE 2 SPRAYS IN BOTH  NOSTRILS 3 TIMES DAILY (Patient taking differently: Place 2 sprays into both nostrils daily.)   Ipratropium-Albuterol (COMBIVENT RESPIMAT) 20-100 MCG/ACT AERS respimat Inhale 2 puffs into the lungs 2 (two) times daily.   ipratropium-albuterol (DUONEB) 0.5-2.5 (3) MG/3ML SOLN Take 3 mLs by nebulization every 6 (six) hours as needed. (Patient taking differently: Take 3 mLs by nebulization every 6 (six) hours as needed (Shortness of breath & bronchitis).)   levocetirizine (XYZAL) 5 MG tablet Take 5 mg by mouth  at bedtime as needed for allergies.   methocarbamol (ROBAXIN) 500 MG tablet Take 1 tablet (500 mg total) by mouth 2 (two) times daily as needed for muscle spasms.   metoCLOPramide (REGLAN) 5 MG tablet TAKE 1 TABLET BY MOUTH 4  TIMES DAILY BEFORE  MEALS  AND AT BEDTIME (Patient taking differently: Take 5 mg by mouth 4 (four) times daily -  before meals and at bedtime.)   naloxone (NARCAN) nasal spray 4 mg/0.1 mL Place 4 mg into the nose as needed for opioid reversal.   nitroGLYCERIN (NITROSTAT) 0.4 MG SL tablet DISSOLVE 1 TABLET UNDER  TONGUE EVERY 5 MINUTES AS  NEEDED FOR CHEST PAIN MAX  OF 3 TABLETS IN 15 MINUTES  . CALL 911 AFTER THIRD DOSE   olmesartan-hydrochlorothiazide (BENICAR HCT) 20-12.5 MG tablet Take 1 tablet by mouth daily. In am if BP >130/>80 take 2 pills to equal 40-25 mg qd   ondansetron (ZOFRAN-ODT) 4 MG disintegrating tablet DISSOLVE 1 TABLET ON TOP OF THE  TONGUE EVERY 8 HOURS AS NEEDED  FOR NAUEA OR VOMITING   pantoprazole (PROTONIX) 40 MG tablet TAKE 1 TABLET BY MOUTH  TWICE DAILY BEFORE MEALS   potassium chloride (MICRO-K) 10 MEQ CR capsule Take 2 capsules (20 mEq total) by mouth daily.   Probiotic Product (PROBIOTIC DAILY PO) Take 200 mg by mouth daily.   promethazine (PHENERGAN) 25 MG tablet TAKE 1 TABLET BY MOUTH TWICE  DAILY AS NEEDED FOR NAUSEA OR  VOMITING   REPATHA SURECLICK 614 MG/ML SOAJ INJECT '140MG'$  SUBCUTANEOUSLY  EVERY 2 WEEKS   sodium chloride (OCEAN) 0.65 % SOLN nasal spray Place 2 sprays into both nostrils daily as needed for congestion.   [EXPIRED] Tdap (BOOSTRIX) 5-2.5-18.5 LF-MCG/0.5 injection Inject 0.5 mLs into the muscle once for 1 dose.   topiramate (TOPAMAX) 50 MG tablet Take 50 mg by mouth at bedtime.   traMADol (ULTRAM) 50 MG tablet Take 50-100 mg by mouth every 8 (eight) hours as needed for moderate pain or severe pain. Pain clinic [facility] in [place]   [DISCONTINUED] isosorbide mononitrate (IMDUR) 60 MG 24 hr tablet Take 90 mg by mouth daily.   Allergies  Allergen Reactions   Haloperidol Lactate Other (See Comments)    Pt states it makes her tongue do weird things     Lorazepam Other (See Comments)    Pt states it makes her tongue do weird things  Other reaction(s): Other   Augmentin  [Amoxicillin-Pot Clavulanate]     Upset stomach    Lactose Intolerance (Gi)     Gi upset   Latex Other (See Comments)    Patient stated that she was told by her doctor that she is "allergic to" this   Tape Other (See Comments)    Patient stated that she was told by her doctor that she is "allergic to" this   Xifaxan [Rifaximin]     Pt believes this is contributing to her abdominal pain/discomfort   Drixoral Allergy Sinus [Dexbromphen-Pse-Apap Er] Rash   Recent Results (from the past 2160 hour(s))  CBC     Status: None   Collection Time: 11/08/21  6:57 AM  Result Value Ref Range   WBC 9.4 4.0 - 10.5 K/uL   RBC 4.33 3.87 - 5.11 MIL/uL   Hemoglobin 12.6 12.0 - 15.0 g/dL   HCT 38.6 36.0 - 46.0 %   MCV 89.1 80.0 - 100.0 fL   MCH 29.1 26.0 - 34.0 pg   MCHC 32.6 30.0 - 36.0 g/dL  RDW 13.7 11.5 - 15.5 %   Platelets 383 150 - 400 K/uL   nRBC 0.0 0.0 - 0.2 %    Comment: Performed at [facility], [place] [address] [place], [place] [address]  Protime-INR     Status: None   Collection Time: 11/08/21  6:57 AM  Result Value Ref Range   Prothrombin Time 12.8 11.4 - 15.2 seconds   INR 1.0 0.8 - 1.2    Comment: (NOTE) INR goal varies based on device and disease states. Performed at [facility], [place] [address], [place], [place] [address]   Basic metabolic panel     Status: Abnormal   Collection Time: 11/08/21  6:57 AM  Result Value Ref Range   Sodium 135 135 - 145 mmol/L   Potassium 3.6 3.5 - 5.1 mmol/L   Chloride 105 98 - 111 mmol/L   CO2 23 22 - 32 mmol/L   Glucose, Bld 112 (H) 70 - 99 mg/dL    Comment: Glucose reference range applies only to samples taken after fasting for at least 8 hours.   BUN 20 6 - 20 mg/dL   Creatinine, Ser 0.84 0.44 - 1.00 mg/dL   Calcium 8.5 (L) 8.9 - 10.3 mg/dL   GFR, Estimated >60 >60 mL/min    Comment: (NOTE) Calculated using the CKD-EPI Creatinine Equation (2021)    Anion gap 7 5 - 15    Comment: Performed at [facility] [address] [place], [place] [address]  Glucose, capillary     Status: Abnormal   Collection Time: 11/08/21  7:00 AM  Result Value Ref Range   Glucose-Capillary 124 (H) 70 - 99 mg/dL    Comment: Glucose reference range applies only to samples taken after fasting for at least 8 hours.  Glucose, capillary     Status: Abnormal   Collection Time: 11/08/21 11:20 AM  Result Value Ref Range   Glucose-Capillary 126 (H) 70 - 99 mg/dL    Comment: Glucose reference range applies only to samples taken after fasting for at least 8 hours.  Glucose, capillary     Status: Abnormal   Collection Time: 11/08/21  1:29 PM  Result Value Ref Range   Glucose-Capillary 145 (H) 70 - 99 mg/dL    Comment: Glucose reference range applies only to samples taken after fasting for at least 8 hours.   Comment 1 Notify RN    Comment 2 Document in Chart   Platelet inhibition p2y12 (not at [facility])     Status: None   Collection Time: 11/18/21  1:00 PM  Result Value Ref Range   Platelet Function  P2Y12 See Scanned report in [place] 182 - 335 PRU    Comment: Performed at [facility] (NOTE) The literature has shown a direct correlation of PRU values over 230 with higher risks of thrombotic events. Lower PRU values are associated with platelet inhibition. Performed at [facility], [place] [address] [place], [place] [address]   Glucose, capillary     Status: Abnormal   Collection Time: 11/25/21  6:30 AM  Result Value Ref Range   Glucose-Capillary 123 (H) 70 - 99 mg/dL    Comment: Glucose reference range applies only to samples taken after fasting for at least 8 hours.  Protime-INR     Status: None   Collection Time: 11/25/21  7:08 AM  Result Value Ref Range   Prothrombin Time 12.3 11.4 - 15.2 seconds  INR 0.9 0.8 - 1.2    Comment: (NOTE) INR goal varies based on device and disease states. Performed at [facility], [place] [address] [place], [place] [address]   Basic metabolic panel      Status: Abnormal   Collection Time: 11/25/21  7:08 AM  Result Value Ref Range   Sodium 137 135 - 145 mmol/L   Potassium 3.0 (L) 3.5 - 5.1 mmol/L   Chloride 107 98 - 111 mmol/L   CO2 21 (L) 22 - 32 mmol/L   Glucose, Bld 134 (H) 70 - 99 mg/dL    Comment: Glucose reference range applies only to samples taken after fasting for at least 8 hours.   BUN 13 6 - 20 mg/dL   Creatinine, Ser 0.79 0.44 - 1.00 mg/dL   Calcium 8.9 8.9 - 10.3 mg/dL   GFR, Estimated >60 >60 mL/min    Comment: (NOTE) Calculated using the CKD-EPI Creatinine Equation (2021)    Anion gap 9 5 - 15    Comment: Performed at [facility] [address] [place], [place] [address]  CBC with Differential/Platelet     Status: Abnormal   Collection Time: 11/25/21  7:08 AM  Result Value Ref Range   WBC 12.4 (H) 4.0 - 10.5 K/uL   RBC 4.00 3.87 - 5.11 MIL/uL   Hemoglobin 12.0 12.0 - 15.0 g/dL   HCT 35.6 (L) 36.0 - 46.0 %   MCV 89.0 80.0 - 100.0 fL   MCH 30.0 26.0 - 34.0 pg   MCHC 33.7 30.0 - 36.0 g/dL   RDW 14.5 11.5 - 15.5 %   Platelets 420 (H) 150 - 400 K/uL   nRBC 0.0 0.0 - 0.2 %   Neutrophils Relative % 61 %   Neutro Abs 7.7 1.7 - 7.7 K/uL   Lymphocytes Relative 27 %   Lymphs Abs 3.3 0.7 - 4.0 K/uL   Monocytes Relative 10 %   Monocytes Absolute 1.3 (H) 0.1 - 1.0 K/uL   Eosinophils Relative 1 %   Eosinophils Absolute 0.1 0.0 - 0.5 K/uL   Basophils Relative 0 %   Basophils Absolute 0.0 0.0 - 0.1 K/uL   Immature Granulocytes 1 %   Abs Immature Granulocytes 0.07 0.00 - 0.07 K/uL    Comment: Performed at [facility], [place] [address], [place], [place] [address]  Platelet inhibition p2y12 (not at [facility])     Status: None   Collection Time: 12/19/21  7:41 AM  Result Value Ref Range   Platelet Function  P2Y12 *See Scanned Report* 182 - 335 PRU    Comment: Performed at [facility], [place] [address], [place],  [address]  CBC with Differential/Platelet     Status: Abnormal   Collection Time:  12/25/21  6:41 AM  Result Value Ref Range   WBC 8.1 4.0 - 10.5 K/uL   RBC 4.00 3.87 - 5.11 MIL/uL   Hemoglobin 11.7 (L) 12.0 - 15.0 g/dL   HCT 36.1 36.0 - 46.0 %   MCV 90.3 80.0 - 100.0 fL   MCH 29.3 26.0 - 34.0 pg   MCHC 32.4 30.0 - 36.0 g/dL   RDW 13.9 11.5 - 15.5 %   Platelets 319 150 - 400 K/uL   nRBC 0.0 0.0 - 0.2 %   Neutrophils Relative % 40 %   Neutro Abs 3.3 1.7 - 7.7 K/uL   Lymphocytes Relative 43 %   Lymphs Abs 3.5 0.7 - 4.0 K/uL  Monocytes Relative 10 %   Monocytes Absolute 0.8 0.1 - 1.0 K/uL   Eosinophils Relative 5 %   Eosinophils Absolute 0.4 0.0 - 0.5 K/uL   Basophils Relative 1 %   Basophils Absolute 0.1 0.0 - 0.1 K/uL   Immature Granulocytes 1 %   Abs Immature Granulocytes 0.06 0.00 - 0.07 K/uL    Comment: Performed at [facility] [address] [place], [place] [address]  Protime-INR     Status: None   Collection Time: 12/25/21  6:41 AM  Result Value Ref Range   Prothrombin Time 12.4 11.4 - 15.2 seconds   INR 0.9 0.8 - 1.2    Comment: (NOTE) INR goal varies based on device and disease states. Performed at [facility], [place] [address] [place], [place] [address]   Basic metabolic panel     Status: Abnormal   Collection Time: 12/25/21  6:41 AM  Result Value Ref Range   Sodium 140 135 - 145 mmol/L   Potassium 4.2 3.5 - 5.1 mmol/L   Chloride 108 98 - 111 mmol/L   CO2 24 22 - 32 mmol/L   Glucose, Bld 115 (H) 70 - 99 mg/dL    Comment: Glucose reference range applies only to samples taken after fasting for at least 8 hours.   BUN 20 6 - 20 mg/dL   Creatinine, Ser 0.79 0.44 - 1.00 mg/dL   Calcium 8.6 (L) 8.9 - 10.3 mg/dL   GFR, Estimated >60 >60 mL/min    Comment: (NOTE) Calculated using the CKD-EPI Creatinine Equation (2021)    Anion gap 8 5 - 15    Comment: Performed at [facility] [address] [place], [place] [address]  Glucose, capillary     Status: Abnormal   Collection Time: 12/25/21  6:50 AM  Result Value Ref Range    Glucose-Capillary 108 (H) 70 - 99 mg/dL    Comment: Glucose reference range applies only to samples taken after fasting for at least 8 hours.  POCT Activated clotting time     Status: None   Collection Time: 12/25/21 10:21 AM  Result Value Ref Range   Activated Clotting Time 215 seconds    Comment: Reference range 74-137 seconds for patients not on anticoagulant therapy.  POCT Activated clotting time     Status: None   Collection Time: 12/25/21 11:14 AM  Result Value Ref Range   Activated Clotting Time 203 seconds    Comment: Reference range 74-137 seconds for patients not on anticoagulant therapy.  Glucose, capillary     Status: Abnormal   Collection Time: 12/25/21 12:14 PM  Result Value Ref Range   Glucose-Capillary 128 (H) 70 - 99 mg/dL    Comment: Glucose reference range applies only to samples taken after fasting for at least 8 hours.  Heparin level (unfractionated)     Status: Abnormal   Collection Time: 12/25/21  7:00 PM  Result Value Ref Range   Heparin Unfractionated <0.10 (L) 0.30 - 0.70 IU/mL    Comment: (NOTE) The clinical reportable range upper limit is being lowered to >1.10 to align with the FDA approved guidance for the current laboratory assay.  If heparin results are below expected values, and patient dosage has  been confirmed, suggest follow up testing of antithrombin III levels. Performed at [facility], [place] [address], [place], [place] [address]   Surgical PCR screen     Status: None   Collection Time: 12/25/21  8:04 PM  Specimen: Nasal Mucosa; Nasal Swab  Result Value Ref Range   MRSA, PCR NEGATIVE NEGATIVE   Staphylococcus aureus NEGATIVE NEGATIVE    Comment: (NOTE) The Xpert SA Assay (FDA approved for NASAL specimens in patients 51 years of age and older), is one component of a comprehensive surveillance program. It is not intended to diagnose infection nor to guide or monitor treatment. Performed at [facility], [place] [address] [place], [place] [address]   Glucose, capillary     Status: Abnormal   Collection Time: 12/25/21  9:41 PM  Result Value Ref Range   Glucose-Capillary 186 (H) 70 - 99 mg/dL    Comment: Glucose reference range applies only to samples taken after fasting for at least 8 hours.  CBC with Differential/Platelet     Status: Abnormal   Collection Time: 12/26/21  5:00 AM  Result Value Ref Range   WBC 8.2 4.0 - 10.5 K/uL   RBC 3.56 (L) 3.87 - 5.11 MIL/uL   Hemoglobin 10.4 (L) 12.0 - 15.0 g/dL   HCT 32.3 (L) 36.0 - 46.0 %   MCV 90.7 80.0 - 100.0 fL   MCH 29.2 26.0 - 34.0 pg   MCHC 32.2 30.0 - 36.0 g/dL   RDW 13.9 11.5 - 15.5 %   Platelets 301 150 - 400 K/uL   nRBC 0.0 0.0 - 0.2 %   Neutrophils Relative % 49 %   Neutro Abs 3.9 1.7 - 7.7 K/uL   Lymphocytes Relative 39 %   Lymphs Abs 3.2 0.7 - 4.0 K/uL   Monocytes Relative 8 %   Monocytes Absolute 0.7 0.1 - 1.0 K/uL   Eosinophils Relative 3 %   Eosinophils Absolute 0.3 0.0 - 0.5 K/uL   Basophils Relative 0 %   Basophils Absolute 0.0 0.0 - 0.1 K/uL   Immature Granulocytes 1 %   Abs Immature Granulocytes 0.05 0.00 - 0.07 K/uL    Comment: Performed at [facility], [address] [place],  [address]  Basic metabolic panel     Status: Abnormal   Collection Time: 12/26/21  5:00 AM  Result Value Ref Range   Sodium 141 135 - 145 mmol/L   Potassium 3.4 (L) 3.5 - 5.1 mmol/L    Comment: DELTA CHECK NOTED   Chloride 108 98 - 111 mmol/L   CO2 24 22 - 32 mmol/L   Glucose, Bld 124 (H) 70 - 99 mg/dL    Comment: Glucose reference range applies only to samples taken after fasting for at least 8 hours.   BUN 11 6 - 20 mg/dL   Creatinine, Ser 0.78 0.44 - 1.00 mg/dL   Calcium 7.9 (L) 8.9 - 10.3 mg/dL   GFR, Estimated >60 >60 mL/min    Comment: (NOTE) Calculated using the CKD-EPI Creatinine Equation (2021)    Anion gap 9 5 - 15    Comment: Performed at [facility] [address] [place], [place] [address]  Glucose, capillary      Status: Abnormal   Collection Time: 12/26/21  7:49 AM  Result Value Ref Range   Glucose-Capillary 145 (H) 70 - 99 mg/dL    Comment: Glucose reference range applies only to samples taken after fasting for at least 8 hours.  Glucose, capillary     Status: Abnormal   Collection Time: 01/20/22  6:24 AM  Result Value Ref Range   Glucose-Capillary 120 (H) 70 - 99 mg/dL    Comment: Glucose reference range applies only to  samples taken after fasting for at least 8 hours.  Basic metabolic panel     Status: Abnormal   Collection Time: 01/20/22  7:11 AM  Result Value Ref Range   Sodium 139 135 - 145 mmol/L   Potassium 4.1 3.5 - 5.1 mmol/L   Chloride 107 98 - 111 mmol/L   CO2 21 (L) 22 - 32 mmol/L   Glucose, Bld 116 (H) 70 - 99 mg/dL    Comment: Glucose reference range applies only to samples taken after fasting for at least 8 hours.   BUN 28 (H) 6 - 20 mg/dL   Creatinine, Ser 0.85 0.44 - 1.00 mg/dL   Calcium 9.1 8.9 - 10.3 mg/dL   GFR, Estimated >60 >60 mL/min    Comment: (NOTE) Calculated using the CKD-EPI Creatinine Equation (2021)    Anion gap 11 5 - 15    Comment: Performed at [facility] [address] [place], [place] [address]  Protime-INR     Status: None   Collection Time: 01/20/22  7:11 AM  Result Value Ref Range   Prothrombin Time 12.6 11.4 - 15.2 seconds   INR 1.0 0.8 - 1.2    Comment: (NOTE) INR goal varies based on device and disease states. Performed at [facility], [place] [address], [place], [place] [address]   CBC with Differential/Platelet     Status: Abnormal   Collection Time: 01/20/22  7:45 AM  Result Value Ref Range   WBC 14.8 (H) 4.0 - 10.5 K/uL   RBC 3.59 (L) 3.87 - 5.11 MIL/uL   Hemoglobin 10.6 (L) 12.0 - 15.0 g/dL   HCT 32.4 (L) 36.0 - 46.0 %   MCV 90.3 80.0 - 100.0 fL   MCH 29.5 26.0 - 34.0 pg   MCHC 32.7 30.0 - 36.0 g/dL   RDW 14.1 11.5 - 15.5 %   Platelets 342 150 - 400 K/uL   nRBC 0.0 0.0 - 0.2 %   Neutrophils Relative % 60 %    Neutro Abs 8.9 (H) 1.7 - 7.7 K/uL   Lymphocytes Relative 26 %   Lymphs Abs 3.9 0.7 - 4.0 K/uL   Monocytes Relative 12 %   Monocytes Absolute 1.7 (H) 0.1 - 1.0 K/uL   Eosinophils Relative 1 %   Eosinophils Absolute 0.1 0.0 - 0.5 K/uL   Basophils Relative 0 %   Basophils Absolute 0.1 0.0 - 0.1 K/uL   Immature Granulocytes 1 %   Abs Immature Granulocytes 0.12 (H) 0.00 - 0.07 K/uL    Comment: Performed at [facility] [address], [place],  [address]  Urinalysis, Complete w Microscopic Urine, Clean Catch     Status: Abnormal   Collection Time: 01/20/22  8:21 AM  Result Value Ref Range   Color, Urine YELLOW YELLOW   APPearance CLEAR CLEAR   Specific Gravity, Urine 1.029 1.005 - 1.030   pH 5.0 5.0 - 8.0   Glucose, UA NEGATIVE NEGATIVE mg/dL   Hgb urine dipstick NEGATIVE NEGATIVE   Bilirubin Urine NEGATIVE NEGATIVE   Ketones, ur NEGATIVE NEGATIVE mg/dL   Protein, ur NEGATIVE NEGATIVE mg/dL   Nitrite NEGATIVE NEGATIVE   Leukocytes,Ua NEGATIVE NEGATIVE   RBC / HPF 0-5 0 - 5 RBC/hpf   WBC, UA 0-5 0 - 5 WBC/hpf   Bacteria, UA RARE (A) NONE SEEN   Squamous Epithelial / LPF 0-5 0 - 5   Mucus PRESENT     Comment: Performed at [facility],  [address], [place], [place] [address]  Urine cytology ancillary only([place])     Status: None   Collection Time: 01/23/22  2:26 PM  Result Value Ref Range   Neisseria Gonorrhea Negative    Chlamydia Negative    Trichomonas Negative    Bacterial Vaginitis (gardnerella) Negative    Candida Vaginitis Negative    Candida Glabrata Negative    Comment      Normal Reference Range Bacterial Vaginosis - Negative   Comment Normal Reference Range Candida Species - Negative    Comment Normal Reference Range Candida Galbrata - Negative    Comment Normal Reference Range Trichomonas - Negative    Comment Normal Reference [place] Chlamydia - Negative    Comment      Normal Reference Range Neisseria Gonorrhea - Negative  Urine  Culture     Status: None   Collection Time: 01/23/22  2:56 PM   Specimen: Urine  Result Value Ref Range   MICRO NUMBER: [id number]    SPECIMEN QUALITY: Adequate    Sample Source URINE    STATUS: FINAL    Result:      Less than 10,000 CFU/mL of single Gram negative organism isolated. No further testing will be performed. If clinically indicated, recollection using a method to minimize contamination, with prompt transfer to Urine Culture Transport Tube, is recommended.  H. pylori breath test     Status: Abnormal   Collection Time: 01/28/22  2:14 PM  Result Value Ref Range   H pylori Breath Test Positive (A) Negative  GI Profile, Stool, PCR     Status: Abnormal   Collection Time: 01/29/22  9:10 AM  Result Value Ref Range   Campylobacter Not Detected Not Detected   C difficile toxin A/B Not Detected Not Detected   Plesiomonas shigelloides Not Detected Not Detected   Salmonella Not Detected Not Detected   Vibrio Not Detected Not Detected   Vibrio cholerae Not Detected Not Detected   Yersinia enterocolitica Not Detected Not Detected   Enteroaggregative E coli Not Detected Not Detected   Enteropathogenic E coli Not Detected Not Detected   Enterotoxigenic E coli Not Detected Not Detected   Shiga-toxin-producing E coli Not Detected Not Detected   E coli I627 Not applicable Not Detected   Shigella/Enteroinvasive E coli Not Detected Not Detected   Cryptosporidium Not Detected Not Detected   Cyclospora cayetanensis Not Detected Not Detected   Entamoeba histolytica Not Detected Not Detected   Giardia lamblia Not Detected Not Detected   Adenovirus F 40/41 Not Detected Not Detected   Astrovirus Not Detected Not Detected   Norovirus GI/GII Detected (A) Not Detected   Rotavirus A Not Detected Not Detected   Sapovirus Not Detected Not Detected   Objective  Body mass index is 35.07 kg/m. Wt Readings from Last 3 Encounters:  01/28/22 198 lb 2 oz (89.9 kg)  01/23/22 198 lb (89.8 kg)  01/20/22  195 lb (88.5 kg)   Temp Readings from Last 3 Encounters:  01/28/22 98.3 F (36.8 C) (Oral)  01/23/22 98.4 F (36.9 C) (Oral)  01/20/22 97.9 F (36.6 C) (Oral)   BP Readings from Last 3 Encounters:  01/28/22 (!) 148/77  01/23/22 130/80  01/20/22 (!) 147/63   Pulse Readings from Last 3 Encounters:  01/28/22 92  01/23/22 74  01/20/22 71    Physical Exam Vitals and nursing note reviewed.  Constitutional:      Appearance: Normal appearance. She is well-developed and well-groomed.  HENT:  Head: Normocephalic and atraumatic.  Eyes:     Conjunctiva/sclera: Conjunctivae normal.     Pupils: Pupils are equal, round, and reactive to light.  Cardiovascular:     Rate and Rhythm: Normal rate and regular rhythm.     Heart sounds: Normal heart sounds. No murmur heard. Pulmonary:     Effort: Pulmonary effort is normal.     Breath sounds: Normal breath sounds.  Abdominal:     General: Abdomen is flat. Bowel sounds are normal.     Tenderness: There is no abdominal tenderness.  Genitourinary:   Musculoskeletal:        General: No tenderness.  Skin:    General: Skin is warm and dry.  Neurological:     General: No focal deficit present.     Mental Status: She is alert and oriented to person, place, and time. Mental status is at baseline.     Cranial Nerves: Cranial nerves 2-12 are intact.     Motor: Motor function is intact.     Coordination: Coordination is intact.     Gait: Gait is intact.  Psychiatric:        Attention and Perception: Attention and perception normal.        Mood and Affect: Mood and affect normal.        Speech: Speech normal.        Behavior: Behavior normal. Behavior is cooperative.        Thought Content: Thought content normal.        Cognition and Memory: Cognition and memory normal.        Judgment: Judgment normal.     Assessment  Plan  Skin lesions faces ?melasma and anus ? Wart to anal region- Plan: Ambulatory referral to Dermatology Skin  cancer screening - Plan: Ambulatory referral to Dermatology  Acute pain of both shoulders F/u ortho and pain clinic in [place] established  Tramadol 50-100 tid prn per pain clinic  Acute cystitis without hematuria - Plan: Urine Culture  Acute vaginitis - Plan: Urine cytology ancillary only([place]) Screening for venereal disease - Plan: Urine cytology ancillary only([place])   Leukocytosis, unspecified type R/o infection with GI had H pylori and norovirus supportive care norovirus GI will tx H pylori and eGD pending will do in the future Diarrhea, unspecified type   HM Declines flu shot Tdap sent to pharmacy  2/2 shingrix utd   Prevnar given today  x 1  covid had 3/3 rec 4th    S/p hysterectomy h/o abnormal pap ? If left ovary still intact right ovary appears out per imaging -established with [facility]  H/o ovarian cyst and abnormal pap with h/o ln2 cervix will need to continue paps until age 9   Pap 10/25/21 neg neg HPV    Dr. Jonathon Bellows colonoscopy had 07/11/19 and 07/29/19 and  EGD had 07/11/19 with path no malignancy concern  H/o sigmoid resection rin past for h/o diverticulitis   07/29/19 colonoscopy neg bx f/u in 10 years per GI Ask GI when this is due they confirmed 10 years this is not update in [facility] though have asked them to update this   -mammo 03/01/21 negative ordered 02/2022 [facility] [place]  mammogram  12/15/19  IMPRESSION: 1.  No mammographic or ultrasound evidence for malignancy. 2. Possible focal area of fat necrosis in the 12:30 o'clock location of the RIGHT breast, warranting follow-up.   RECOMMENDATION: Recommend RIGHT breast ultrasound in 6 months to assess stability.  rec smoking cessation smoking < or = 0.5 ppd also using THC since age 65 y.o rec cessation    Eye MD prev seen 06/25/21 Provider: Dr. Olivia Mackie McLean-Scocuzza-Internal Medicine

## 2022-01-24 LAB — URINE CULTURE
MICRO NUMBER:: 13643090
SPECIMEN QUALITY:: ADEQUATE

## 2022-01-27 LAB — URINE CYTOLOGY ANCILLARY ONLY
Bacterial Vaginitis (gardnerella): NEGATIVE
Candida Glabrata: NEGATIVE
Candida Vaginitis: NEGATIVE
Chlamydia: NEGATIVE
Comment: NEGATIVE
Comment: NEGATIVE
Comment: NEGATIVE
Comment: NEGATIVE
Comment: NEGATIVE
Comment: NORMAL
Neisseria Gonorrhea: NEGATIVE
Trichomonas: NEGATIVE

## 2022-01-28 ENCOUNTER — Ambulatory Visit (INDEPENDENT_AMBULATORY_CARE_PROVIDER_SITE_OTHER): Payer: Medicare Other | Admitting: Gastroenterology

## 2022-01-28 ENCOUNTER — Encounter: Payer: Self-pay | Admitting: Gastroenterology

## 2022-01-28 ENCOUNTER — Other Ambulatory Visit: Payer: Self-pay

## 2022-01-28 VITALS — BP 148/77 | HR 92 | Temp 98.3°F | Ht 63.0 in | Wt 198.1 lb

## 2022-01-28 DIAGNOSIS — K3184 Gastroparesis: Secondary | ICD-10-CM

## 2022-01-28 DIAGNOSIS — R195 Other fecal abnormalities: Secondary | ICD-10-CM

## 2022-01-28 DIAGNOSIS — Z8619 Personal history of other infectious and parasitic diseases: Secondary | ICD-10-CM

## 2022-01-28 DIAGNOSIS — M79672 Pain in left foot: Secondary | ICD-10-CM | POA: Diagnosis not present

## 2022-01-28 DIAGNOSIS — M79671 Pain in right foot: Secondary | ICD-10-CM | POA: Diagnosis not present

## 2022-01-28 MED ORDER — ERYTHROMYCIN BASE 250 MG PO TABS: 250.0000 mg | ORAL_TABLET | Freq: Three times a day (TID) | ORAL | 0 refills | Status: AC

## 2022-01-28 NOTE — Progress Notes (Signed)
Cephas Darby, MD [address]  [place], [place] [address]  Main: [phone]  Fax: [phone]    Gastroenterology Consultation  Referring Provider:     [facility]-Scocuzza, Olivia Mackie * Primary Care Physician:  [facility]-Scocuzza, Nino Glow, MD Primary Gastroenterologist:  Dr. Sherri Sear Reason for Consultation:     Nausea, vomiting, chronic constipation        HPI:   Shawna Anderson is a 56 y.o. female referred by Dr. Terese Door, Nino Glow, MD  for consultation & management of chronic symptoms of nausea, vomiting, left upper quadrant pain, chronic constipation.  Patient had several hospitalizations and ER visits for nausea and vomiting.  She is diagnosed with mild gastroparesis based on gastric emptying study in 07/2020.  She was on Reglan as needed.  Patient was previously under the care of Dr. Vicente Males and she wanted to switch her care to me.  Patient has history of chronic marijuana use since age of 89.  She also reports severe constipation, has tried MiraLAX and other laxatives, stool softeners with no relief.  Her labs are unrevealing, TSH normal  Follow-up visit 10/01/2021 Patient has made a follow-up appointment on request of her PCP due to recent worsening of her GI symptoms which include nausea, vomiting, epigastric burning pain, worsening of constipation, abdominal bloating.  Patient reports that she experiences burning pain that wakes her up from sleep middle of the night.  Patient was seen by her PCP on 09/12/2021 with worsening of the symptoms.  Ozempic has been discontinued.  She is taking Protonix 40 mg twice daily.  She is started on dicyclomine.  Patient reports that her vomiting has improved but continues to have rest of the symptoms.  She has severe constipation, tried Linzess 290 mcg samples in the past, patient did not like the medication because of diarrhea.  Patient states that she has always been a meat eater.  She does not eat vegetables.  She has large cup of  coffee with creamer and sugar daily.  She does not drink adequate amount of water.  She has gained about 8 pounds since stopping Ozempic.  Her most recent LFTs showed mildly elevated AST and ALT.  Follow-up visit 01/28/2022 Patient is here for follow-up appointment on the request of her PCP due to worsening of her GI symptoms.  Patient is very upset that she has been constantly having sulfur burps and is feeling quite embarrassed in public places, going out.  She has gained about 8 more pounds since last visit.  Patient wants me to check for C. difficile, MRSA today because of her symptoms of sulfur burps as well as she reports that her stools are variable consistency and foul-smelling.  She does admit to drinking fruit juices daily, less amount of water, she also reports she is a meat person and eats meat every day, does eat mashed potatoes, cheese, pasta, bread.  She hardly eats any fruits and vegetables.  She does have bilateral severe plantar fasciitis which has limited her physical activity.  Patient is tearful with her present situation and extremely frustrated, she was also furious at me and demanding me to check for any bacteria.  She reports that she has been taking Reglan 5 mg before each meal and at bedtime.  Her diabetes is fairly under control.  Patient denies any abdominal pain, nausea or vomiting.  She denies any rectal bleeding.  NSAIDs: None  Antiplts/Anticoagulants/Anti thrombotics: None  GI Procedures:  Colonoscopy 07/29/2019 - One  5 mm polyp in the distal ascending colon, removed with a cold snare. Resected and retrieved. Clip was placed. - The examination was otherwise normal. DIAGNOSIS:  A. COLON POLYP, ASCENDING; COLD SNARE:  - MUCOSAL TELANGIECTASIA AND HEMORRHAGE.  - NEGATIVE FOR DYSPLASIA AND MALIGNANCY (ADDITIONAL DEEPER SECTIONS WERE  REVIEWED).  Upper endoscopy 07/11/2019 - Non-obstructing Schatzki ring. - Gastritis. Biopsied. - Normal examined duodenum. DIAGNOSIS:   A. STOMACH; COLD BIOPSY:  - GASTRIC ANTRAL AND OXYNTIC MUCOSA WITH NO SIGNIFICANT PATHOLOGIC  ALTERATION.  - NEGATIVE FOR ACTIVE INFLAMMATION AND H PYLORI.  - NEGATIVE FOR INTESTINAL METAPLASIA, DYSPLASIA, AND MALIGNANCY.   Past Medical History:  Diagnosis Date   Anxiety    Arthritis    Bilateral shoulders   Asthma    Bipolar 1 disorder ([place])    Bipolar disorder ([place])    CAD (coronary artery disease)    s/p stent BMS OM Cx   Cervical herniated disc 04/12/2016   COPD (chronic obstructive pulmonary disease) ([place])    Depression    Diabetes mellitus with gastroparesis (HCC)    Diabetes mellitus without complication (HCC)    no meds, diet controlled   Diverticulitis    GERD (gastroesophageal reflux disease)    Glaucoma    History of blood transfusion    Hyperlipidemia    Hypertension    OSA (obstructive sleep apnea)    not using cpap    Plantar fasciitis    b/l feet s/p steroid shots w/o help and left surgery w/o help    PONV (postoperative nausea and vomiting)    Spinal headache    UTI (urinary tract infection)     Past Surgical History:  Procedure Laterality Date   ABDOMINAL HYSTERECTOMY     ABDOMINAL SURGERY  1995   Bowel resection.   CARDIAC CATHETERIZATION N/A 04/14/2016   Procedure: Left Heart Cath and Coronary Angiography;  Surgeon: Burnell Blanks, MD;  Location: [facility];  Service: Cardiovascular;  Laterality: N/A;   CARDIAC SURGERY     COLONOSCOPY WITH PROPOFOL N/A 07/11/2019   Procedure: COLONOSCOPY WITH PROPOFOL;  Surgeon: Jonathon Bellows, MD;  Location: [facility] ENDOSCOPY;  Service: Gastroenterology;  Laterality: N/A;   COLONOSCOPY WITH PROPOFOL N/A 07/29/2019   Procedure: COLONOSCOPY WITH PROPOFOL;  Surgeon: Jonathon Bellows, MD;  Location: [facility] ENDOSCOPY;  Service: Gastroenterology;  Laterality: N/A;   CORONARY ANGIOPLASTY WITH STENT PLACEMENT  2010   Drug eluting stent   ESOPHAGOGASTRODUODENOSCOPY (EGD) WITH PROPOFOL N/A 07/11/2019   Procedure:  ESOPHAGOGASTRODUODENOSCOPY (EGD) WITH PROPOFOL;  Surgeon: Jonathon Bellows, MD;  Location: [facility] ENDOSCOPY;  Service: Gastroenterology;  Laterality: N/A;   IR 3D INDEPENDENT WKST  11/08/2021   IR 3D INDEPENDENT WKST  12/25/2021   IR ANGIO INTRA EXTRACRAN SEL COM CAROTID INNOMINATE BILAT MOD SED  11/08/2021   IR ANGIO INTRA EXTRACRAN SEL COM CAROTID INNOMINATE UNI R MOD SED  12/25/2021   IR ANGIO VERTEBRAL SEL VERTEBRAL BILAT MOD SED  11/08/2021   IR ANGIOGRAM FOLLOW UP STUDY  12/25/2021   IR ANGIOGRAM FOLLOW UP STUDY  12/25/2021   IR ANGIOGRAM FOLLOW UP STUDY  12/25/2021   IR NEURO EACH ADD'L AFTER BASIC UNI RIGHT (MS)  12/25/2021   IR RADIOLOGIST EVAL & MGMT  10/16/2021   IR TRANSCATH/EMBOLIZ  12/25/2021   IR US GUIDE VASC ACCESS RIGHT  11/08/2021   IR US GUIDE VASC ACCESS RIGHT  12/25/2021   LEFT HEART CATH AND CORONARY ANGIOGRAPHY Left 09/11/2020   Procedure: LEFT HEART CATH AND CORONARY ANGIOGRAPHY;  Surgeon: Nelva Bush, MD;  Location: [facility];  Service: Cardiovascular;  Laterality: Left;   OVARIAN CYST REMOVAL     RADIOLOGY WITH ANESTHESIA N/A 11/08/2021   Procedure: Cyril Loosen;  Surgeon: Luanne Bras, MD;  Location: [facility];  Service: Radiology;  Laterality: N/A;   RADIOLOGY WITH ANESTHESIA N/A 12/25/2021   Procedure: EMBOLIZATION;  Surgeon: Luanne Bras, MD;  Location: [facility];  Service: Radiology;  Laterality: N/A;   Current Outpatient Medications:    albuterol (PROAIR HFA) 108 (90 Base) MCG/ACT inhaler, Inhale 1-2 puffs into the lungs every 4 (four) hours as needed for wheezing or shortness of breath. (Patient taking differently: Inhale 1-2 puffs into the lungs 2 (two) times daily.), Disp: 54 g, Rfl: 3   alum & mag hydroxide-simeth (MAALOX/MYLANTA) 200-200-20 MG/5ML suspension, Take 30 mLs by mouth every 6 (six) hours as needed (Sulfa Burps)., Disp: , Rfl:    amLODipine (NORVASC) 10 MG tablet, TAKE 1 TABLET BY MOUTH AT  BEDTIME, Disp: 90 tablet, Rfl: 3   aspirin 81 MG chewable  tablet, Chew 81 mg by mouth daily., Disp: , Rfl:    atorvastatin (LIPITOR) 80 MG tablet, TAKE 1 TABLET BY MOUTH  DAILY AT 6 PM., Disp: 90 tablet, Rfl: 3   baclofen (LIORESAL) 10 MG tablet, Take 10 mg by mouth 2 (two) times daily., Disp: , Rfl:    bismuth subsalicylate (PEPTO BISMOL) 262 MG/15ML suspension, Take 30 mLs by mouth every 6 (six) hours as needed (Sulfa burps)., Disp: , Rfl:    Budeson-Glycopyrrol-Formoterol (BREZTRI AEROSPHERE) 160-9-4.8 MCG/ACT AERO, Inhale 2 puffs into the lungs in the morning and at bedtime. Rinse, Disp: 32.1 g, Rfl: 3   clopidogrel (PLAVIX) 75 MG tablet, TAKE 1 TABLET BY MOUTH  DAILY (Patient taking differently: Take 37.5 mg by mouth daily.), Disp: 90 tablet, Rfl: 3   dicyclomine (BENTYL) 20 MG tablet, Take 1 tablet (20 mg total) by mouth 4 (four) times daily -  before meals and at bedtime. (Patient taking differently: Take 20 mg by mouth daily as needed (Vomiting).), Disp: 120 tablet, Rfl: 2   divalproex (DEPAKOTE) 500 MG DR tablet, TAKE 1 TABLET BY MOUTH  TWICE DAILY, Disp: 180 tablet, Rfl: 3   erythromycin (E-MYCIN) 250 MG tablet, Take 1 tablet (250 mg total) by mouth 3 (three) times daily for 14 days., Disp: 42 tablet, Rfl: 0   ezetimibe (ZETIA) 10 MG tablet, TAKE 1 TABLET BY MOUTH  DAILY WITH LIPITOR 80 MG AT NIGHT, Disp: 90 tablet, Rfl: 3   fluticasone (FLONASE) 50 MCG/ACT nasal spray, Place 2 sprays into both nostrils daily as needed for allergies or rhinitis. (Patient taking differently: Place 2 sprays into both nostrils daily.), Disp: 16 g, Rfl: 2   gabapentin (NEURONTIN) 300 MG capsule, Take 300 mg by mouth at bedtime., Disp: , Rfl:    ipratropium (ATROVENT) 0.06 % nasal spray, USE 2 SPRAYS IN BOTH  NOSTRILS 3 TIMES DAILY (Patient taking differently: Place 2 sprays into both nostrils daily.), Disp: 75 mL, Rfl: 4   Ipratropium-Albuterol (COMBIVENT RESPIMAT) 20-100 MCG/ACT AERS respimat, Inhale 2 puffs into the lungs 2 (two) times daily., Disp: , Rfl:     ipratropium-albuterol (DUONEB) 0.5-2.5 (3) MG/3ML SOLN, Take 3 mLs by nebulization every 6 (six) hours as needed. (Patient taking differently: Take 3 mLs by nebulization every 6 (six) hours as needed (Shortness of breath & bronchitis).), Disp: 75 mL, Rfl: 0   isosorbide mononitrate (IMDUR) 60 MG 24 hr tablet, Take 90 mg by mouth daily.,  Disp: , Rfl:    levocetirizine (XYZAL) 5 MG tablet, Take 5 mg by mouth at bedtime as needed for allergies., Disp: , Rfl:    methocarbamol (ROBAXIN) 500 MG tablet, Take 1 tablet (500 mg total) by mouth 2 (two) times daily as needed for muscle spasms., Disp: 180 tablet, Rfl: 1   metoCLOPramide (REGLAN) 5 MG tablet, TAKE 1 TABLET BY MOUTH 4  TIMES DAILY BEFORE MEALS  AND AT BEDTIME (Patient taking differently: Take 5 mg by mouth 4 (four) times daily -  before meals and at bedtime.), Disp: 360 tablet, Rfl: 2   naloxone (NARCAN) nasal spray 4 mg/0.1 mL, Place 4 mg into the nose as needed for opioid reversal., Disp: , Rfl:    nitroGLYCERIN (NITROSTAT) 0.4 MG SL tablet, DISSOLVE 1 TABLET UNDER  TONGUE EVERY 5 MINUTES AS  NEEDED FOR CHEST PAIN MAX  OF 3 TABLETS IN 15 MINUTES  . CALL 911 AFTER THIRD DOSE, Disp: 25 tablet, Rfl: 4   olmesartan-hydrochlorothiazide (BENICAR HCT) 20-12.5 MG tablet, Take 1 tablet by mouth daily. In am if BP >130/>80 take 2 pills to equal 40-25 mg qd, Disp: 180 tablet, Rfl: 3   ondansetron (ZOFRAN-ODT) 4 MG disintegrating tablet, DISSOLVE 1 TABLET ON TOP OF THE  TONGUE EVERY 8 HOURS AS NEEDED  FOR NAUEA OR VOMITING, Disp: 270 tablet, Rfl: 1   pantoprazole (PROTONIX) 40 MG tablet, TAKE 1 TABLET BY MOUTH  TWICE DAILY BEFORE MEALS, Disp: 180 tablet, Rfl: 3   potassium chloride (MICRO-K) 10 MEQ CR capsule, Take 2 capsules (20 mEq total) by mouth daily., Disp: 180 capsule, Rfl: 3   Probiotic Product (PROBIOTIC DAILY PO), Take 200 mg by mouth daily., Disp: , Rfl:    promethazine (PHENERGAN) 25 MG tablet, TAKE 1 TABLET BY MOUTH TWICE  DAILY AS NEEDED FOR NAUSEA  OR  VOMITING, Disp: 180 tablet, Rfl: 2   REPATHA SURECLICK 161 MG/ML SOAJ, INJECT '140MG'$  SUBCUTANEOUSLY  EVERY 2 WEEKS, Disp: 6 mL, Rfl: 1   sodium chloride (OCEAN) 0.65 % SOLN nasal spray, Place 2 sprays into both nostrils daily as needed for congestion., Disp: 30 mL, Rfl: 11   topiramate (TOPAMAX) 50 MG tablet, Take 50 mg by mouth at bedtime., Disp: , Rfl:    traMADol (ULTRAM) 50 MG tablet, Take 50-100 mg by mouth every 8 (eight) hours as needed for moderate pain or severe pain., Disp: , Rfl:     Family History  Problem Relation Age of Onset   CAD Mother    Depression Mother    Heart disease Mother    Hyperlipidemia Mother    Hypertension Mother    Heart disease Father    Alcohol abuse Father    Cancer Brother 50       brain   CAD Brother    Depression Brother    Diabetes Brother    Heart disease Brother    Hyperlipidemia Brother    Lupus Other    Sickle cell anemia Other      Social History   Tobacco Use   Smoking status: Former    Packs/day: 1.00    Years: 30.00    Total pack years: 30.00    Types: Cigarettes    Quit date: 12/31/2020    Years since quitting: 1.0   Smokeless tobacco: Never   Tobacco comments:    Quit after being admitted on 6/20 01/17/2021  Vaping Use   Vaping Use: Former  Substance Use Topics   Alcohol use: Yes  Comment: once a year   Drug use: Yes    Types: Marijuana    Comment: Daily    Allergies as of 01/28/2022 - Review Complete 01/28/2022  Allergen Reaction Noted   Haloperidol lactate Other (See Comments) 11/12/2018   Lorazepam Other (See Comments) 11/14/2017   Augmentin [amoxicillin-pot clavulanate]  12/05/2020   Lactose intolerance (gi)  04/23/2021   Latex Other (See Comments) 02/12/2017   Tape Other (See Comments) 02/12/2017   Xifaxan [rifaximin]  09/21/2019   Drixoral allergy sinus [dexbromphen-pse-apap er] Rash 01/12/2015    Review of Systems:    All systems reviewed and negative except where noted in HPI.   Physical Exam:   BP (!) 148/77 (BP Location: Left Arm, Patient Position: Sitting, Cuff Size: Normal)   Pulse 92   Temp 98.3 F (36.8 C) (Oral)   Ht '5\' 3"'$  (1.6 m)   Wt 198 lb 2 oz (89.9 kg)   BMI 35.10 kg/m  No LMP recorded. Patient has had a hysterectomy.  General:   Alert,  Well-developed, well-nourished, pleasant and cooperative in NAD Head:  Normocephalic and atraumatic. Eyes:  Sclera clear, no icterus.   Conjunctiva pink. Ears:  Normal auditory acuity. Nose:  No deformity, discharge, or lesions. Mouth:  No deformity or lesions,oropharynx pink & moist. Neck:  Supple; no masses or thyromegaly. Lungs:  Respirations even and unlabored.  Clear throughout to auscultation.   No wheezes, crackles, or rhonchi. No acute distress. Heart:  Regular rate and rhythm; no murmurs, clicks, rubs, or gallops. Abdomen:  Normal bowel sounds. Soft, obese, non-tender and non-distended without masses, hepatosplenomegaly or hernias noted.  No guarding or rebound tenderness.   Rectal: Not performed Msk:  Symmetrical without gross deformities. Good, equal movement & strength bilaterally. Pulses:  Normal pulses noted. Extremities:  No clubbing or edema.  No cyanosis. Neurologic:  Alert and oriented x3;  grossly normal neurologically. Skin:  Intact without significant lesions or rashes. No jaundice. Psych:  Alert and cooperative. Normal mood and affect.  Imaging Studies: Gastric emptying study 07/20/2020 FINDINGS: Expected location of the stomach in the left upper quadrant. Ingested meal empties the stomach gradually over the course of the study.   40% emptied at 1 hr ( normal >= 10%)   58% emptied at 2 hr ( normal >= 40%)   67% emptied at 3 hr ( normal >= 70%)   89% emptied at 4 hr ( normal >= 90%)   IMPRESSION: Slightly delayed late-phase gastric emptying at 3 and 4 hours.  Assessment and Plan:   JORGINA BINNING is a 56 y.o. female with history of chronic marijuana use, bipolar, diabetes, metabolic  syndrome, COPD, coronary artery disease s/p PCI on DAPT, cerebral aneurysm s/p embolization on 12/25/2021, history of gastroparesis is seen in for follow-up of constant sulfur burps and foul-smelling stools of variable consistency  Constant sulfur burps, foul-smelling stools of variable consistency Her symptoms are most likely related to her eating habits I spent majority of the time today in counseling her regarding balanced diet and various dietary interventions for management of gastroparesis I have also advised patient to stay on liquid diet only for 24 to 48 hours including water, chicken broth or vegetable broth, Gatorade as needed Start erythromycin 250 mg before each meal for 2 weeks Also, advised patient that she could increase Reglan to 10 mg before each meal Perform H. pylori breath test today GI profile PCR to rule out C. difficile in particular Recommend EGD for further evaluation Discussed about  gastroparesis diet, information provided Diabetes is fairly controlled   Elevated LFTs Viral hepatitis panel negative in the past, likely secondary to fatty liver Recheck LFTs during next follow-up appointment If LFTs are persistently elevated recommend secondary liver disease work-up  Follow up in 1 month, okay to overbook   Cephas Darby, MD

## 2022-01-29 ENCOUNTER — Other Ambulatory Visit: Payer: Self-pay

## 2022-01-29 DIAGNOSIS — R195 Other fecal abnormalities: Secondary | ICD-10-CM | POA: Diagnosis not present

## 2022-01-29 LAB — H. PYLORI BREATH TEST: H pylori Breath Test: POSITIVE — AB

## 2022-01-29 MED ORDER — ISOSORBIDE MONONITRATE ER 60 MG PO TB24: 90.0000 mg | ORAL_TABLET | Freq: Every day | ORAL | 0 refills | Status: DC

## 2022-01-30 ENCOUNTER — Telehealth: Payer: Self-pay | Admitting: Internal Medicine

## 2022-01-30 NOTE — Telephone Encounter (Signed)
[facility] called and wanted to speak to Dr Audrie Gallus nurse. She has been to the GI and tests have been done. She states she is still not digesting her medication. She would like Dr Olivia Mackie to know this information.

## 2022-01-30 NOTE — Telephone Encounter (Signed)
Hi  Can you reach out to patient she called my office but this seems like a message for GI

## 2022-01-30 NOTE — Telephone Encounter (Signed)
LMOM to give [place] a call back to discuss the meds she isn't digesting.

## 2022-01-31 ENCOUNTER — Encounter: Payer: Self-pay | Admitting: Gastroenterology

## 2022-01-31 LAB — GI PROFILE, STOOL, PCR

## 2022-01-31 MED ORDER — OMEPRAZOLE 20 MG PO CPDR: 20.0000 mg | DELAYED_RELEASE_CAPSULE | Freq: Two times a day (BID) | ORAL | 0 refills | Status: DC

## 2022-01-31 MED ORDER — AMOXICILLIN 500 MG PO CAPS: 1000.0000 mg | ORAL_CAPSULE | Freq: Two times a day (BID) | ORAL | 0 refills | Status: DC

## 2022-01-31 MED ORDER — CLARITHROMYCIN 500 MG PO TABS: 500.0000 mg | ORAL_TABLET | Freq: Two times a day (BID) | ORAL | 0 refills | Status: DC

## 2022-01-31 NOTE — Addendum Note (Signed)
Addended by: Lurlean Nanny on: 01/31/2022 10:16 AM   Modules accepted: Orders

## 2022-02-06 DIAGNOSIS — M79671 Pain in right foot: Secondary | ICD-10-CM | POA: Diagnosis not present

## 2022-02-06 DIAGNOSIS — M79672 Pain in left foot: Secondary | ICD-10-CM | POA: Diagnosis not present

## 2022-02-06 DIAGNOSIS — M47817 Spondylosis without myelopathy or radiculopathy, lumbosacral region: Secondary | ICD-10-CM | POA: Diagnosis not present

## 2022-02-10 ENCOUNTER — Other Ambulatory Visit: Payer: Self-pay

## 2022-02-10 ENCOUNTER — Inpatient Hospital Stay (HOSPITAL_COMMUNITY): Admission: RE | Admit: 2022-02-10 | Payer: Medicare Other | Source: Ambulatory Visit

## 2022-02-10 ENCOUNTER — Telehealth: Payer: Self-pay | Admitting: Internal Medicine

## 2022-02-10 DIAGNOSIS — D72829 Elevated white blood cell count, unspecified: Secondary | ICD-10-CM

## 2022-02-10 NOTE — Telephone Encounter (Signed)
New Message:    Patient have moved to [facility]. She would like for Dr End to refer her to a Cardiologist in [place] please.

## 2022-02-12 ENCOUNTER — Telehealth: Payer: Self-pay | Admitting: Oncology

## 2022-02-12 NOTE — Telephone Encounter (Signed)
Spoke with pt. Notified of [facility] in [place], and any cardiology provider should be fine. Pt appreciative and states she is still planning to come for her follow up scheduled next week 02/17/22. Pt states she will fill out a ROI at that time for medical records to be sent to [facility]. Pt has no further needs at this time.

## 2022-02-12 NOTE — Telephone Encounter (Signed)
pt called in to have appts cancelled, she has moved to [facility] and states that she was referred to new Dr.

## 2022-02-13 ENCOUNTER — Ambulatory Visit: Admit: 2022-02-13 | Payer: Medicare Other | Admitting: Gastroenterology

## 2022-02-13 SURGERY — ESOPHAGOGASTRODUODENOSCOPY (EGD) WITH PROPOFOL
Anesthesia: General

## 2022-02-17 ENCOUNTER — Encounter: Payer: Self-pay | Admitting: Medical

## 2022-02-17 ENCOUNTER — Ambulatory Visit (INDEPENDENT_AMBULATORY_CARE_PROVIDER_SITE_OTHER): Payer: Medicare Other | Admitting: Medical

## 2022-02-17 VITALS — BP 128/70 | HR 76 | Ht 63.0 in | Wt 194.4 lb

## 2022-02-17 DIAGNOSIS — I10 Essential (primary) hypertension: Secondary | ICD-10-CM

## 2022-02-17 DIAGNOSIS — I25118 Atherosclerotic heart disease of native coronary artery with other forms of angina pectoris: Secondary | ICD-10-CM | POA: Diagnosis not present

## 2022-02-17 DIAGNOSIS — E785 Hyperlipidemia, unspecified: Secondary | ICD-10-CM | POA: Diagnosis not present

## 2022-02-17 DIAGNOSIS — I2510 Atherosclerotic heart disease of native coronary artery without angina pectoris: Secondary | ICD-10-CM | POA: Diagnosis not present

## 2022-02-17 DIAGNOSIS — Z9861 Coronary angioplasty status: Secondary | ICD-10-CM | POA: Diagnosis not present

## 2022-02-17 DIAGNOSIS — E876 Hypokalemia: Secondary | ICD-10-CM | POA: Diagnosis not present

## 2022-02-17 MED ORDER — ATORVASTATIN CALCIUM 80 MG PO TABS: 80.0000 mg | ORAL_TABLET | Freq: Every day | ORAL | 2 refills | Status: AC

## 2022-02-17 MED ORDER — OLMESARTAN MEDOXOMIL-HCTZ 20-12.5 MG PO TABS: 1.0000 | ORAL_TABLET | Freq: Every day | ORAL | 2 refills | Status: DC

## 2022-02-17 MED ORDER — CLOPIDOGREL BISULFATE 75 MG PO TABS: 75.0000 mg | ORAL_TABLET | Freq: Every day | ORAL | 2 refills | Status: AC

## 2022-02-17 MED ORDER — ISOSORBIDE MONONITRATE ER 60 MG PO TB24: 60.0000 mg | ORAL_TABLET | Freq: Every day | ORAL | 3 refills | Status: AC

## 2022-02-17 MED ORDER — POTASSIUM CHLORIDE ER 10 MEQ PO CPCR: 20.0000 meq | ORAL_CAPSULE | Freq: Every day | ORAL | 2 refills | Status: DC

## 2022-02-17 MED ORDER — AMLODIPINE BESYLATE 10 MG PO TABS: 10.0000 mg | ORAL_TABLET | Freq: Every day | ORAL | 2 refills | Status: DC

## 2022-02-17 MED ORDER — EZETIMIBE 10 MG PO TABS: ORAL_TABLET | ORAL | 2 refills | Status: DC

## 2022-02-17 MED ORDER — OMEPRAZOLE 20 MG PO CPDR: 20.0000 mg | DELAYED_RELEASE_CAPSULE | Freq: Two times a day (BID) | ORAL | 2 refills | Status: DC

## 2022-02-17 MED ORDER — REPATHA SURECLICK 140 MG/ML ~~LOC~~ SOAJ: SUBCUTANEOUS | 2 refills | Status: DC

## 2022-02-17 NOTE — Progress Notes (Signed)
Cardiology Office Note:    Date:  02/17/2022   ID:  Shawna Anderson, DOB [birth year], MRN [id number]  PCP:  McLean-Scocuzza, Nino Glow, MD  [facility] Cardiologist:  Nelva Bush, MD  [facility] Electrophysiologist:  None   Referring MD: McLean-Scocuzza, Olivia Mackie *   Chief Complaint: 6 month follow-up  History of Present Illness:    Shawna Anderson is a 56 y.o. female with a hx of CAD (2010 BMS to LCx: 2017 restenosis of LCx), HTN, HLD, GERD, sleep apnea, asthma, current smoker with both tobacco and marijuana, bipolar/anxiety presents today for CAD follow-up.   CAD was initially discovered during preop work-up for prior ovarian cyst removal in 2010. BMS placed ostial/proximal LCx. She was admitted to [facility] 03/1016 for chest pain after being off multiple medications due to financial constraints. Catheterization showed moderate in-stent restenosis of the ostial/proximal left circumflex stent. Recommended for medical management. She has had multiple office visits since that time reporting stable anginal symptoms with continued medication adjustments. She does use nitroglycerin as needed. Imdur 30 mg daily was added to her office visit in December due to anginal symptoms and cardiac catheterization was offered though she declined.   Seen in follow up 01/31/20. Noted to have been seen by Dr. Vicente Males of GI the day prior and recommended for barium swallow due to dysphagia. She endorsed reduced frequency of chest pain since addition of Imdur.  Continued to smoke, but had purchased nicotine patches and is planning to quit. BP at home has been checked intermittently and labile though noted taking medication at very different times each day.   She had a gastric emptying study which was slightly delayed. Continues to follow with GI.  Last seen 08/21/21 and reported intermittent headaches, possible from HA vs Imdur. no changes were made. She also reported claudication symptoms and ABIs were  ordered. Imaging showed total occlusion in the right peroneal artery and 30-49% in the common femoral artery. Medication was recommended.   Today, BP is good. She says this is up and down. She reports sharp chest pain while at rest, may lost 2-3 minutes. No associated symptoms. May occur 3 times this month. She took 2 NTG. She will wake up with chest pain. She is moving to a new cardiologist and she will let the new doctor know. She has occasional headaches. She denies lower leg edema, orthopnea or pnd.     Past Medical History:  Diagnosis Date   Anxiety    Arthritis    Bilateral shoulders   Asthma    Bipolar 1 disorder ([place])    Bipolar disorder ([place])    CAD (coronary artery disease)    s/p stent BMS OM Cx   Cervical herniated disc 04/12/2016   COPD (chronic obstructive pulmonary disease) (HCC)    Depression    Diabetes mellitus with gastroparesis (HCC)    Diabetes mellitus without complication (HCC)    no meds, diet controlled   Diverticulitis    GERD (gastroesophageal reflux disease)    Glaucoma    History of blood transfusion    Hyperlipidemia    Hypertension    OSA (obstructive sleep apnea)    not using cpap    Plantar fasciitis    b/l feet s/p steroid shots w/o help and left surgery w/o help    PONV (postoperative nausea and vomiting)    Spinal headache    UTI (urinary tract infection)     Past Surgical History:  Procedure Laterality Date  ABDOMINAL HYSTERECTOMY     ABDOMINAL SURGERY  1995   Bowel resection.   CARDIAC CATHETERIZATION N/A 04/14/2016   Procedure: Left Heart Cath and Coronary Angiography;  Surgeon: Burnell Blanks, MD;  Location: [place] [facility];  Service: Cardiovascular;  Laterality: N/A;   CARDIAC SURGERY     COLONOSCOPY WITH PROPOFOL N/A 07/11/2019   Procedure: COLONOSCOPY WITH PROPOFOL;  Surgeon: Jonathon Bellows, MD;  Location: [facility] ENDOSCOPY;  Service: Gastroenterology;  Laterality: N/A;   COLONOSCOPY WITH PROPOFOL N/A 07/29/2019    Procedure: COLONOSCOPY WITH PROPOFOL;  Surgeon: Jonathon Bellows, MD;  Location: [facility] ENDOSCOPY;  Service: Gastroenterology;  Laterality: N/A;   CORONARY ANGIOPLASTY WITH STENT PLACEMENT  2010   Drug eluting stent   ESOPHAGOGASTRODUODENOSCOPY (EGD) WITH PROPOFOL N/A 07/11/2019   Procedure: ESOPHAGOGASTRODUODENOSCOPY (EGD) WITH PROPOFOL;  Surgeon: Jonathon Bellows, MD;  Location: [facility] ENDOSCOPY;  Service: Gastroenterology;  Laterality: N/A;   IR 3D INDEPENDENT WKST  11/08/2021   IR 3D INDEPENDENT WKST  12/25/2021   IR ANGIO INTRA EXTRACRAN SEL COM CAROTID INNOMINATE BILAT MOD SED  11/08/2021   IR ANGIO INTRA EXTRACRAN SEL COM CAROTID INNOMINATE UNI R MOD SED  12/25/2021   IR ANGIO VERTEBRAL SEL VERTEBRAL BILAT MOD SED  11/08/2021   IR ANGIOGRAM FOLLOW UP STUDY  12/25/2021   IR ANGIOGRAM FOLLOW UP STUDY  12/25/2021   IR ANGIOGRAM FOLLOW UP STUDY  12/25/2021   IR NEURO EACH ADD'L AFTER BASIC UNI RIGHT (MS)  12/25/2021   IR RADIOLOGIST EVAL & MGMT  10/16/2021   IR TRANSCATH/EMBOLIZ  12/25/2021   IR US GUIDE VASC ACCESS RIGHT  11/08/2021   IR US GUIDE VASC ACCESS RIGHT  12/25/2021   LEFT HEART CATH AND CORONARY ANGIOGRAPHY Left 09/11/2020   Procedure: LEFT HEART CATH AND CORONARY ANGIOGRAPHY;  Surgeon: Nelva Bush, MD;  Location: [place] [facility];  Service: Cardiovascular;  Laterality: Left;   OVARIAN CYST REMOVAL     RADIOLOGY WITH ANESTHESIA N/A 11/08/2021   Procedure: Cyril Loosen;  Surgeon: Luanne Bras, MD;  Location: [place];  Service: Radiology;  Laterality: N/A;   RADIOLOGY WITH ANESTHESIA N/A 12/25/2021   Procedure: EMBOLIZATION;  Surgeon: Luanne Bras, MD;  Location: [place];  Service: Radiology;  Laterality: N/A;    Current Medications: Current Meds  Medication Sig   albuterol (PROAIR HFA) 108 (90 Base) MCG/ACT inhaler Inhale 1-2 puffs into the lungs every 4 (four) hours as needed for wheezing or shortness of breath. (Patient taking differently: Inhale 1-2 puffs into the lungs 2 (two) times  daily.)   alum & mag hydroxide-simeth (MAALOX/MYLANTA) 200-200-20 MG/5ML suspension Take 30 mLs by mouth every 6 (six) hours as needed (Sulfa Burps).   aspirin 81 MG chewable tablet Chew 81 mg by mouth daily.   baclofen (LIORESAL) 10 MG tablet Take 10 mg by mouth 2 (two) times daily.   bismuth subsalicylate (PEPTO BISMOL) 262 MG/15ML suspension Take 30 mLs by mouth every 6 (six) hours as needed (Sulfa burps).   Budeson-Glycopyrrol-Formoterol (BREZTRI AEROSPHERE) 160-9-4.8 MCG/ACT AERO Inhale 2 puffs into the lungs in the morning and at bedtime. Rinse   dicyclomine (BENTYL) 20 MG tablet Take 1 tablet (20 mg total) by mouth 4 (four) times daily -  before meals and at bedtime. (Patient taking differently: Take 20 mg by mouth daily as needed (Vomiting).)   divalproex (DEPAKOTE) 500 MG DR tablet TAKE 1 TABLET BY MOUTH  TWICE DAILY   fluticasone (FLONASE) 50 MCG/ACT nasal spray Place 2 sprays into both nostrils daily as  needed for allergies or rhinitis. (Patient taking differently: Place 2 sprays into both nostrils daily.)   gabapentin (NEURONTIN) 300 MG capsule Take 300 mg by mouth at bedtime.   ipratropium (ATROVENT) 0.06 % nasal spray USE 2 SPRAYS IN BOTH  NOSTRILS 3 TIMES DAILY (Patient taking differently: Place 2 sprays into both nostrils daily.)   Ipratropium-Albuterol (COMBIVENT RESPIMAT) 20-100 MCG/ACT AERS respimat Inhale 2 puffs into the lungs 2 (two) times daily.   ipratropium-albuterol (DUONEB) 0.5-2.5 (3) MG/3ML SOLN Take 3 mLs by nebulization every 6 (six) hours as needed. (Patient taking differently: Take 3 mLs by nebulization every 6 (six) hours as needed (Shortness of breath & bronchitis).)   levocetirizine (XYZAL) 5 MG tablet Take 5 mg by mouth at bedtime as needed for allergies.   methocarbamol (ROBAXIN) 500 MG tablet Take 1 tablet (500 mg total) by mouth 2 (two) times daily as needed for muscle spasms.   metoCLOPramide (REGLAN) 5 MG tablet TAKE 1 TABLET BY MOUTH 4  TIMES DAILY BEFORE  MEALS  AND AT BEDTIME (Patient taking differently: Take 5 mg by mouth 4 (four) times daily -  before meals and at bedtime.)   naloxone (NARCAN) nasal spray 4 mg/0.1 mL Place 4 mg into the nose as needed for opioid reversal.   nitroGLYCERIN (NITROSTAT) 0.4 MG SL tablet DISSOLVE 1 TABLET UNDER  TONGUE EVERY 5 MINUTES AS  NEEDED FOR CHEST PAIN MAX  OF 3 TABLETS IN 15 MINUTES  . CALL 911 AFTER THIRD DOSE   ondansetron (ZOFRAN-ODT) 4 MG disintegrating tablet DISSOLVE 1 TABLET ON TOP OF THE  TONGUE EVERY 8 HOURS AS NEEDED  FOR NAUEA OR VOMITING   pantoprazole (PROTONIX) 40 MG tablet TAKE 1 TABLET BY MOUTH  TWICE DAILY BEFORE MEALS   Probiotic Product (PROBIOTIC DAILY PO) Take 200 mg by mouth daily.   promethazine (PHENERGAN) 25 MG tablet TAKE 1 TABLET BY MOUTH TWICE  DAILY AS NEEDED FOR NAUSEA OR  VOMITING   sodium chloride (OCEAN) 0.65 % SOLN nasal spray Place 2 sprays into both nostrils daily as needed for congestion.   topiramate (TOPAMAX) 50 MG tablet Take 50 mg by mouth at bedtime.   traMADol (ULTRAM) 50 MG tablet Take 50-100 mg by mouth every 8 (eight) hours as needed for moderate pain or severe pain. Pain clinic [place]   [DISCONTINUED] amLODipine (NORVASC) 10 MG tablet TAKE 1 TABLET BY MOUTH AT  BEDTIME   [DISCONTINUED] atorvastatin (LIPITOR) 80 MG tablet TAKE 1 TABLET BY MOUTH  DAILY AT 6 PM.   [DISCONTINUED] clopidogrel (PLAVIX) 75 MG tablet TAKE 1 TABLET BY MOUTH  DAILY (Patient taking differently: Take 37.5 mg by mouth daily.)   [DISCONTINUED] ezetimibe (ZETIA) 10 MG tablet TAKE 1 TABLET BY MOUTH  DAILY WITH LIPITOR 80 MG AT NIGHT   [DISCONTINUED] isosorbide mononitrate (IMDUR) 60 MG 24 hr tablet Take 1.5 tablets (90 mg total) by mouth daily. (Patient taking differently: Take 60 mg by mouth daily.)   [DISCONTINUED] olmesartan-hydrochlorothiazide (BENICAR HCT) 20-12.5 MG tablet Take 1 tablet by mouth daily. In am if BP >130/>80 take 2 pills to equal 40-25 mg qd   [DISCONTINUED]  potassium chloride (MICRO-K) 10 MEQ CR capsule Take 2 capsules (20 mEq total) by mouth daily.   [DISCONTINUED] REPATHA SURECLICK 034 MG/ML SOAJ INJECT '140MG'$  SUBCUTANEOUSLY  EVERY 2 WEEKS     Allergies:   Haloperidol lactate, Lorazepam, Augmentin [amoxicillin-pot clavulanate], Lactose intolerance (gi), Latex, Tape, Xifaxan [rifaximin], and Drixoral allergy sinus [dexbromphen-pse-apap er]   Social  History   Socioeconomic History   Marital status: Single    Spouse name: Not on file   Number of children: 1   Years of education: Not on file   Highest education level: Not on file  Occupational History   Not on file  Tobacco Use   Smoking status: Former    Packs/day: 1.00    Years: 30.00    Total pack years: 30.00    Types: Cigarettes    Quit date: 12/31/2020    Years since quitting: 1.1   Smokeless tobacco: Never   Tobacco comments:    Quit after being admitted on 6/20 01/17/2021  Vaping Use   Vaping Use: Former  Substance and Sexual Activity   Alcohol use: Yes    Comment: once a year   Drug use: Yes    Types: Marijuana    Comment: Daily   Sexual activity: Not Currently  Other Topics Concern   Not on file  Social History Narrative   From [place] now living in [place]    1 son    No guns    Wears seat belt   Safe in relationship    Social Determinants of Health   Financial Resource Strain: Low Risk  (10/16/2021)   Overall Financial Resource Strain (CARDIA)    Difficulty of Paying Living Expenses: Not very hard  Food Insecurity: No Food Insecurity (10/16/2021)   Hunger Vital Sign    Worried About Running Out of Food in the Last Year: Never true    [place] in the Last Year: Never true  Transportation Needs: No Transportation Needs (10/16/2021)   PRAPARE - Hydrologist (Medical): No    Lack of Transportation (Non-Medical): No  Physical Activity: Not on file  Stress: No Stress Concern Present (10/16/2021)   [place]    Feeling of Stress : Not at all  Social Connections: Unknown (10/16/2021)   Social Connection and Isolation Panel [NHANES]    Frequency of Communication with Friends and Family: More than three times a week    Frequency of Social Gatherings with Friends and Family: More than three times a week    Attends Religious Services: Not on Advertising copywriter or Organizations: Not on file    Attends Archivist Meetings: Not on file    Marital Status: Not on file     Family History: The patient's family history includes Alcohol abuse in her father; CAD in her brother and mother; Cancer (age of onset: 14) in her brother; Depression in her brother and mother; Diabetes in her brother; Heart disease in her brother, father, and mother; Hyperlipidemia in her brother and mother; Hypertension in her mother; Lupus in an other family member; Sickle cell anemia in an other family member.  ROS:   Please see the history of present illness.     All other systems reviewed and are negative.  EKGs/Labs/Other Studies Reviewed:    The following studies were reviewed today:  Cardiac cath 09/2020 Conclusions: Moderately severe multivessel coronary artery disease including mild plaquing of the proximal and distal LAD, 40% in-stent restenosis of ostial LCx, 30% distal LCx lesion, and focal 60% mid RCA stenosis. Hyperdynamic left ventricular contraction with moderately elevated filling pressure. Significant difficulty attaining adequate sedation during procedure.  The patient would fluctuate between marked agitation and sleep every few minutes after having received 3  mg of midazolam and 75 mcg of fentanyl.   Recommendations: Continue current antianginal therapy with escalation as tolerated if recurrent angina occurs. Add furosemide 20 mg daily for gentle diuresis in the setting of elevated LVEDP consistent with diastolic dysfunction.  This may  be contributing to the patient's symptoms. If the patient has refractory angina, functional study to assess hemodynamic significance of LCx/RCA disease is recommended.  If catheterization/PCI is needed in the future, involvement of anesthesia will need to be considered.   Nelva Bush, MD [facility]  EKG:  EKG is ordered today.  The ekg ordered today demonstrates NSR 76bpm, LAD, TWI aVL, no changes  Recent Labs: 02/19/2021: TSH 3.052 04/24/2021: Magnesium 1.8 08/21/2021: ALT 58 01/20/2022: BUN 28; Creatinine, Ser 0.85; Hemoglobin 10.6; Platelets 342; Potassium 4.1; Sodium 139  Recent Lipid Panel    Component Value Date/Time   CHOL 178 08/21/2021 1002   TRIG 239 (H) 08/21/2021 1002   HDL 67 08/21/2021 1002   CHOLHDL 2.7 08/21/2021 1002   CHOLHDL 4.4 08/30/2020 1112   VLDL 33 08/30/2020 1112   LDLCALC 73 08/21/2021 1002   LDLCALC 193 (H) 12/31/2017 0905   LDLDIRECT 148.0 (H) 08/30/2020 1112     Physical Exam:    VS:  BP 128/70 (BP Location: Left Arm, Patient Position: Sitting, Cuff Size: Large)   Pulse 76   Ht '5\' 3"'$  (1.6 m)   Wt 194 lb 6 oz (88.2 kg)   SpO2 98%   BMI 34.43 kg/m     Wt Readings from Last 3 Encounters:  02/17/22 194 lb 6 oz (88.2 kg)  01/28/22 198 lb 2 oz (89.9 kg)  01/23/22 198 lb (89.8 kg)     GEN:  Well nourished, well developed in no acute distress HEENT: Normal NECK: No JVD; No carotid bruits LYMPHATICS: No lymphadenopathy CARDIAC: RRR, no murmurs, rubs, gallops RESPIRATORY:  Clear to auscultation without rales, wheezing or rhonchi  ABDOMEN: Soft, non-tender, non-distended MUSCULOSKELETAL:  No edema; No deformity  SKIN: Warm and dry NEUROLOGIC:  Alert and oriented x 3 PSYCHIATRIC:  Normal affect   ASSESSMENT:    1. Coronary artery disease of native artery of native heart with stable angina pectoris ([place])   2. Essential hypertension   3. Dyslipidemia   4. CAD S/P CFX PCI 2010   5. Hypertension, unspecified type   6. Hypokalemia     PLAN:    In order of problems listed above:  CAD with stable angina She describes fairly atypical angina. EKG with no changes today. We discussed a stress test, but since she is moving to a new doctor, we will not order any testing. Continue Aspirin, Plavix, statin, Repatha, Benicar, and Imdur. She will discuss symptoms with her new cardiologist.   HLD LDL 38. Continue Lipitor, Repatha and Zetia.   HTN BP today is good. Continue Amlodipine '10mg'$  daily, Benicar 20-12.'5mg'$  daily and Imdur '90mg'$  daily. She denies further headaches.   Disposition: Follow up PRN  Signed, Rayvion Stumph Ninfa Meeker, PA-C  02/17/2022 9:03 AM    [place]

## 2022-02-17 NOTE — Patient Instructions (Signed)
Medication Instructions:  Your physician recommends that you continue on your current medications as directed. Please refer to the Current Medication list given to you today.   *If you need a refill on your cardiac medications before your next appointment, please call your pharmacy*   Lab Work: None ordered  If you have labs (blood work) drawn today and your tests are completely normal, you will receive your results only by: [place] (if you have MyChart) OR A paper copy in the mail If you have any lab test that is abnormal or we need to change your treatment, we will call you to review the results.   Testing/Procedures: None ordered   Follow-Up: At [facility], you and your health needs are our priority.  As part of our continuing mission to provide you with exceptional heart care, we have created designated Provider Care Teams.  These Care Teams include your primary Cardiologist (physician) and Advanced Practice Providers (APPs -  Physician Assistants and Nurse Practitioners) who all work together to provide you with the care you need, when you need it.  We recommend signing up for the patient portal called "MyChart".  Sign up information is provided on this After Visit Summary.  MyChart is used to connect with patients for Virtual Visits (Telemedicine).  Patients are able to view lab/test results, encounter notes, upcoming appointments, etc.  Non-urgent messages can be sent to your provider as well.   To learn more about what you can do with MyChart, go to [email or web address].    Your next appointment:   As needed  The format for your next appointment:   In Person  Provider:   You may see Nelva Bush, MD or one of the following Advanced Practice Providers on your designated Care Team:   Murray Hodgkins, NP Christell Faith, PA-C Cadence Kathlen Mody, [place]   Other Instructions N/A  Important Information About Sugar

## 2022-02-24 ENCOUNTER — Telehealth: Payer: Self-pay

## 2022-02-24 NOTE — Telephone Encounter (Signed)
Encounter opened in error

## 2022-02-27 IMAGING — DX DG CHEST 1V PORT
1 series · 1 of 1 positions shown · non-contrast
Comparison: Radiograph 02/10/2021

CLINICAL DATA: Leukocytosis

EXAM:
PORTABLE CHEST 1 VIEW

[chest ap]
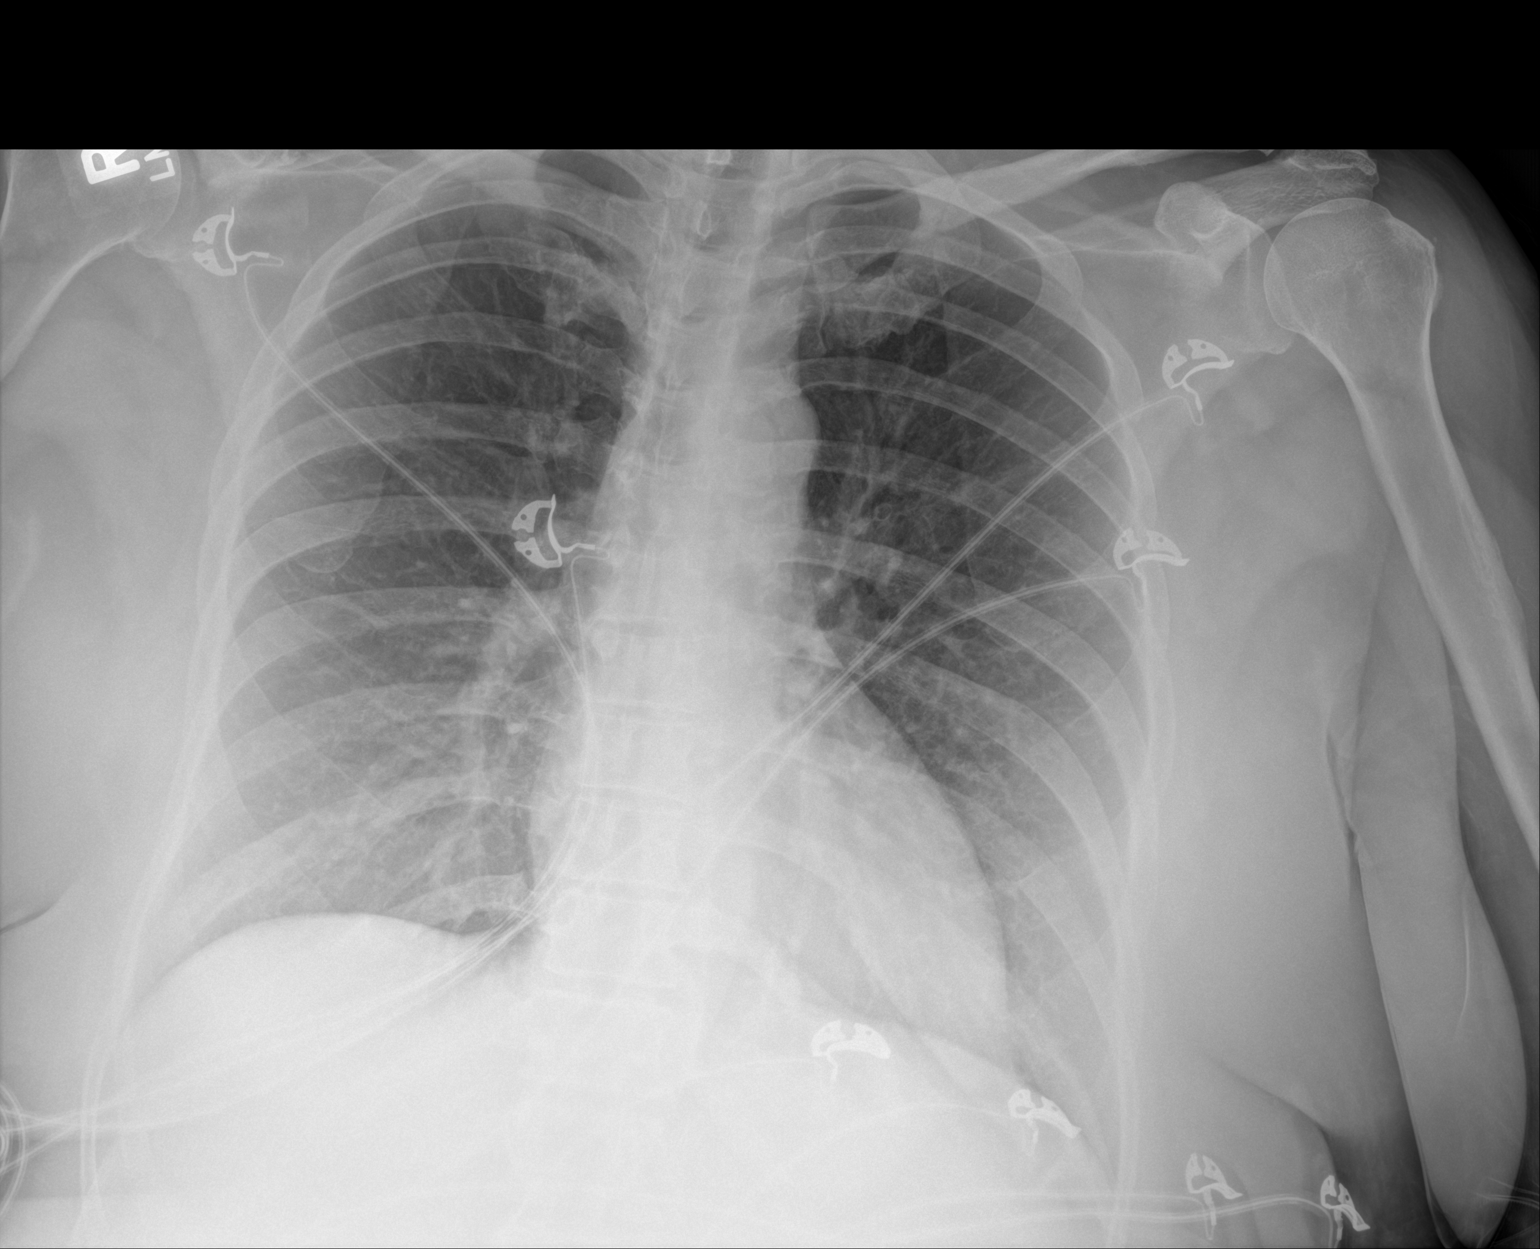

[1 of 1 positions shown; findings below may reference images not displayed]

FINDINGS: Accounting for body habitus, the lungs are clear. No consolidation,
features of edema, pneumothorax, or effusion. Pulmonary vascularity
is normally distributed. The cardiomediastinal contours are
unremarkable. No acute osseous or soft tissue abnormality. Telemetry
leads overlie the chest.
IMPRESSION: No acute cardiopulmonary abnormality.

## 2022-03-03 ENCOUNTER — Other Ambulatory Visit: Payer: Medicare Other

## 2022-03-03 ENCOUNTER — Other Ambulatory Visit: Payer: Self-pay

## 2022-03-04 ENCOUNTER — Telehealth: Payer: Self-pay

## 2022-03-04 NOTE — Telephone Encounter (Signed)
Per Vicente Males at [employer] will not cover combination medication, Benicar-HCT 20-12.5 mg tablets. Will cover generic medications separately. Please advise.

## 2022-03-05 MED ORDER — HYDROCHLOROTHIAZIDE 12.5 MG PO CAPS: 12.5000 mg | ORAL_CAPSULE | Freq: Every day | ORAL | 0 refills | Status: DC

## 2022-03-05 MED ORDER — OLMESARTAN MEDOXOMIL 20 MG PO TABS: 20.0000 mg | ORAL_TABLET | Freq: Every day | ORAL | 0 refills | Status: DC

## 2022-03-05 NOTE — Addendum Note (Signed)
Addended by: Raelene Bott, Sacheen Arrasmith L on: 03/05/2022 09:54 AM   Modules accepted: Orders

## 2022-03-05 NOTE — Telephone Encounter (Signed)
Requested Prescriptions   Signed Prescriptions Disp Refills   olmesartan (BENICAR) 20 MG tablet 90 tablet 0    Sig: Take 1 tablet (20 mg total) by mouth daily.    Authorizing Provider: Antony Madura    Ordering User: Raelene Bott, Yola Paradiso L   hydrochlorothiazide (MICROZIDE) 12.5 MG capsule 90 capsule 0    Sig: Take 1 capsule (12.5 mg total) by mouth daily.    Authorizing Provider: Antony Madura    Ordering User: Raelene Bott, Katrenia Alkins L

## 2022-03-05 NOTE — Telephone Encounter (Signed)
Spoke with patient and informed her of medication change per insurance coverage issue. Patient expressed verbal understanding.

## 2022-03-06 DIAGNOSIS — Z8619 Personal history of other infectious and parasitic diseases: Secondary | ICD-10-CM | POA: Diagnosis not present

## 2022-03-07 LAB — H. PYLORI BREATH TEST: H pylori Breath Test: NEGATIVE

## 2022-03-10 DIAGNOSIS — M47817 Spondylosis without myelopathy or radiculopathy, lumbosacral region: Secondary | ICD-10-CM | POA: Diagnosis not present

## 2022-03-11 ENCOUNTER — Ambulatory Visit: Payer: Medicare Other | Admitting: Gastroenterology

## 2022-03-11 DIAGNOSIS — R0601 Orthopnea: Secondary | ICD-10-CM | POA: Diagnosis not present

## 2022-03-11 DIAGNOSIS — I10 Essential (primary) hypertension: Secondary | ICD-10-CM | POA: Diagnosis not present

## 2022-03-11 DIAGNOSIS — R5383 Other fatigue: Secondary | ICD-10-CM | POA: Diagnosis not present

## 2022-03-11 DIAGNOSIS — D649 Anemia, unspecified: Secondary | ICD-10-CM | POA: Diagnosis not present

## 2022-03-11 DIAGNOSIS — I25118 Atherosclerotic heart disease of native coronary artery with other forms of angina pectoris: Secondary | ICD-10-CM | POA: Diagnosis not present

## 2022-03-11 DIAGNOSIS — Z1231 Encounter for screening mammogram for malignant neoplasm of breast: Secondary | ICD-10-CM | POA: Diagnosis not present

## 2022-03-11 DIAGNOSIS — E782 Mixed hyperlipidemia: Secondary | ICD-10-CM | POA: Diagnosis not present

## 2022-03-12 DIAGNOSIS — M79672 Pain in left foot: Secondary | ICD-10-CM | POA: Diagnosis not present

## 2022-03-12 DIAGNOSIS — M79671 Pain in right foot: Secondary | ICD-10-CM | POA: Diagnosis not present

## 2022-03-20 DIAGNOSIS — K219 Gastro-esophageal reflux disease without esophagitis: Secondary | ICD-10-CM | POA: Diagnosis not present

## 2022-03-20 DIAGNOSIS — K3184 Gastroparesis: Secondary | ICD-10-CM | POA: Diagnosis not present

## 2022-03-20 DIAGNOSIS — R109 Unspecified abdominal pain: Secondary | ICD-10-CM | POA: Diagnosis not present

## 2022-03-20 DIAGNOSIS — R1013 Epigastric pain: Secondary | ICD-10-CM | POA: Diagnosis not present

## 2022-03-20 DIAGNOSIS — R140 Abdominal distension (gaseous): Secondary | ICD-10-CM | POA: Diagnosis not present

## 2022-03-20 DIAGNOSIS — Z79899 Other long term (current) drug therapy: Secondary | ICD-10-CM | POA: Diagnosis not present

## 2022-03-20 DIAGNOSIS — R112 Nausea with vomiting, unspecified: Secondary | ICD-10-CM | POA: Diagnosis not present

## 2022-03-20 DIAGNOSIS — R6881 Early satiety: Secondary | ICD-10-CM | POA: Diagnosis not present

## 2022-03-26 ENCOUNTER — Other Ambulatory Visit (HOSPITAL_COMMUNITY)
Admission: RE | Admit: 2022-03-26 | Discharge: 2022-03-26 | Disposition: A | Discharge status: Home / Self Care | Payer: Medicare Other | Attending: Interventional Radiology | Admitting: Interventional Radiology

## 2022-03-26 ENCOUNTER — Other Ambulatory Visit (HOSPITAL_COMMUNITY): Payer: Self-pay

## 2022-03-26 DIAGNOSIS — I729 Aneurysm of unspecified site: Secondary | ICD-10-CM | POA: Insufficient documentation

## 2022-03-27 ENCOUNTER — Telehealth (HOSPITAL_COMMUNITY): Payer: Self-pay | Admitting: Radiology

## 2022-03-27 ENCOUNTER — Other Ambulatory Visit: Payer: Self-pay | Admitting: Radiology

## 2022-03-27 DIAGNOSIS — I671 Cerebral aneurysm, nonruptured: Secondary | ICD-10-CM

## 2022-03-27 NOTE — Telephone Encounter (Signed)
Per Dr. Estanislado Pandy pt is to take Aspirin '81mg'$  1 QD and now change her Plavix '75mg'$  1 QD from a whole tablet to a half of a tablet. Pt states understanding and is in agreement with this plan of care. Patient's recent P2Y12 result came in today at a 4, thus the reduction in the dosage. JM

## 2022-03-27 NOTE — Progress Notes (Incomplete)
PCP - Dr Olivia Mackie McLean-Scocuzza Cardiologist - Dr  Idolina Primer  Chest x-ray - 11/28/21 EKG - 02/17/22 Stress Test - 03/01/19 ECHO - 09/11/20 Cardiac Cath - 09/11/20  ICD Pacemaker/Loop - n/a  Sleep Study -  Yes CPAP - does not use CPAP  Diabetes Type 2, no meds.  Patient does not check her blood sugar.  Last A1C on 10/25/21 was 6.3.  Marijuana use daily.  Instructed patient to hold 24 hours prior to surgery and DOS.  Patient verbalized understanding.  Anesthesia review: Yes  STOP now taking Aleve, Naproxen, Ibuprofen, Motrin, Advil, Goody's, BC's, all herbal medications, fish oil, and all vitamins.   Coronavirus Screening Do you have any of the following symptoms:  Cough yes/no: No Fever (>100.53F)  yes/no: No Runny nose yes/no: No Sore throat yes/no: No Difficulty breathing/shortness of breath  yes/no: No  Have you traveled in the last 14 days and where? yes/no: No  Patient verbalized understanding of instructions that were given via phone.

## 2022-03-28 ENCOUNTER — Encounter (HOSPITAL_COMMUNITY): Payer: Self-pay | Admitting: Interventional Radiology

## 2022-03-28 ENCOUNTER — Other Ambulatory Visit: Payer: Self-pay | Admitting: Radiology

## 2022-03-28 NOTE — Anesthesia Preprocedure Evaluation (Signed)
Anesthesia Evaluation  Patient identified by MRN, date of birth, ID band Patient awake    Reviewed: Allergy & Precautions, NPO status , Patient's Chart, lab work & pertinent test results  History of Anesthesia Complications (+) PONV, DIFFICULT AIRWAY, POST - OP SPINAL HEADACHE and history of anesthetic complications  Airway Mallampati: IV  TM Distance: >3 FB Neck ROM: Full    Dental  (+) Dental Advisory Given, Partial Lower, Chipped,    Pulmonary asthma , sleep apnea , COPD,  COPD inhaler, Patient abstained from smoking., former smoker,    Pulmonary exam normal breath sounds clear to auscultation       Cardiovascular hypertension, Pt. on medications (-) angina+ CAD, + Cardiac Stents and + Peripheral Vascular Disease  Normal cardiovascular exam Rhythm:Regular Rate:Normal     Neuro/Psych  Headaches, PSYCHIATRIC DISORDERS Anxiety Depression Bipolar Disorder right PCA and right MCA aneurysm, s/p coiling of right PCA aneurysm 12/25/21    GI/Hepatic Neg liver ROS, GERD  Medicated,  Endo/Other  diabetes, Type 2Obesity   Renal/GU negative Renal ROS     Musculoskeletal  (+) Arthritis ,   Abdominal   Peds  Hematology  (+) Blood dyscrasia (Plavix), anemia ,   Anesthesia Other Findings   Reproductive/Obstetrics                           Anesthesia Physical Anesthesia Plan  ASA: 4  Anesthesia Plan: General   Post-op Pain Management: Tylenol PO (pre-op)*   Induction: Intravenous  PONV Risk Score and Plan: 4 or greater and Dexamethasone, Ondansetron and Treatment may vary due to age or medical condition  Airway Management Planned: Oral ETT and Video Laryngoscope Planned  Additional Equipment: Arterial line  Intra-op Plan:   Post-operative Plan: Extubation in OR  Informed Consent: I have reviewed the patients History and Physical, chart, labs and discussed the procedure including the risks,  benefits and alternatives for the proposed anesthesia with the patient or authorized representative who has indicated his/her understanding and acceptance.     Dental advisory given  Plan Discussed with: CRNA  Anesthesia Plan Comments: (See PAT note written 03/28/2022 by Myra Gianotti, PA-C.  2nd large bore PIV. )      Anesthesia Quick Evaluation

## 2022-03-28 NOTE — Progress Notes (Signed)
Anesthesia Chart Review: Shawna Shawna  Case: [id number] Date/Time: 03/31/22 0815   Procedure: ANEURYSM EMBOLIZATION   Anesthesia type: General   Pre-op diagnosis: ANEURYSM   Location: [place] [facility] [place] [facility]   Surgeons: Shawna Bras, MD       DISCUSSION: Patient is a 56 year old female scheduled for the above procedure. She has a right PCA and right MCA aneurysm, s/p coiling of right PCA aneurysm 12/25/21. She was initially scheduled for intervention of right MCA aneurysm on 01/20/22, but procedure was cancelled due to leukocytosis. She had follow-up with her PCP Shawna Shawna, Nino Glow, MD on 01/23/22 and found to be positive for Norovirus and H. Pylori on 01/29/22.  (Repeat H. Pylori was negative on 03/06/22.) Urine culture showed less than 10,000 gram-negative organisms.  She also has known history waxing and waning leukocytosis and thrombocytosis that is followed by hematologist Dr. Janese Anderson.     History includes former smoker (quit 12/31/20), post-operative N/V, spinal headache, COPD/chronic bronchitis, asthma, CAD (s/p LCX stent 2010 with chronic stable angina), HTN, HLD, DM2, OSA (intolerant to CPAP), GERD, Bipolar 1 disorder, glaucoma, cerebral aneurysm (right PCA & right MCA aneurysms, s/p endovascular coiling of right PCA aneurysm 12/25/21).    Last seen by new cardiologist Shawna Primer, MD on 03/11/22 ([place]). Previously saw Dr. Harrell Anderson End, but she relocated. She reported ongoing stabbing chest pain on and off for several years. She has occasionally taken Nitro. She "completes her housework and chores without limitations."  She had LHC 09/11/20 that showed moderately severe multivessel coronary artery disease including mild plaquing of the proximal and distal LAD, 40% in-stent restenosis of ostial LCx, 30% distal LCx lesion, and focal 60% mid RCA stenosis.  Continued medical therapy recommended with escalation of antianginal therapy as tolerated. Dr. Carlis Anderson was not  convinced she was having angina, but did add b-blocker and stopped olmesartan and ASA. Continued Plavix for monotherapy.   Per 03/27/22 notation by Shawna Shawna in IR, "Per Shawna Shawna pt is to take Aspirin 72m 1 QD and now change her Plavix 733m1 QD from a whole tablet to a half of a tablet. Pt states understanding and is in agreement with this plan of care. Patient's recent P2Y12 result came in today at a 4, thus the reduction in the dosage". has    Last pulmonology visit 01/03/22 by Shawna PitterNP. She notes patient has not been able to complete PFTs due to severe anxiety. Breztri is helpful with her symptoms. She is intolerant to CPAP and will not use nocturnal O2, so [facility] so updated sleep study ordered. 3 month follow-up recommended.      She follows with hematology for history of waxing and waning leukocytosis and thrombocytosis.  Last seen bu Dr. RaJanese Banksn 08/27/21. WBC was normal and platelets mildly elevated at 475. Myeloproliferative work-up in the past has been otherwise unremarkable. Year follow-up recommended.   Reviewed last cardiology note by Dr. ClCarlis Abbottrom 03/11/22. He recommended continued medical therapy for CAD. She is a same-day work-up, so anesthesia team to evaluate on the day of surgery.    VS: Ht _0  (1.6 m)   Wt 88.2 kg   BMI 34.44 kg/m  BP Readings from Last 3 Encounters:  02/17/22 128/70  01/28/22 (!) 148/77  01/23/22 130/80   Pulse Readings from Last 3 Encounters:  02/17/22 76  01/28/22 92  01/23/22 74     PROVIDERS: Shawna Shawna, TrNino GlowMD is PCP  Shawna Primer, MD is cardiologist, previously End, Shawna Gave, MD. Shawna Gambles, MD is pulmonologist Shawna Evens, MD is hematologist Shawna Bacca, MD is GI ([facility]). Initial evaluation 03/20/22.   LABS: For labs on arrival as indicated. As of 03/11/22 ([facility]), WBC 14.4, H/H 11.1/33.8, PLT 421, glucose 96, creatinine 0.85, BUN 20, EGFR 81, sodium 141,  potassium 4.1, AST 14, ALT 22.   IMAGES: Cerebral Angiogram/Transcatheter Embolization 12/25/21: IMPRESSION: 1. 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 2. 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   CXR 11/28/21: FINDINGS: The heart size and mediastinal contours are within normal limits. Both lungs are clear. The visualized skeletal structures are unremarkable. IMPRESSION: No active cardiopulmonary disease.     EKG:  EKG 03/11/22 ([facility]): Per result narrative in Care Everywhere: Sinus  Rhythm  -  Nonspecific T-abnormality.   EKG 02/17/22: NSR. LAD.      CV: Cath 09/11/2020: Conclusions: Moderately severe multivessel coronary artery disease including mild plaquing of the proximal and distal LAD, 40% in-stent restenosis of ostial LCx, 30% distal LCx lesion, and focal 60% mid RCA stenosis. Hyperdynamic left ventricular contraction with moderately elevated filling pressure. Significant difficulty attaining adequate sedation during procedure.  The patient would fluctuate between marked agitation and sleep every few minutes after having received 3 mg of midazolam and 75 mcg of fentanyl.   Recommendations: Continue current antianginal therapy with escalation as tolerated if recurrent angina occurs. Add furosemide 20 mg daily for gentle diuresis in the setting of elevated LVEDP consistent with diastolic dysfunction.  This may be contributing to the patient's symptoms. If the patient has refractory angina, functional study to assess hemodynamic significance of LCx/RCA disease is recommended.  If catheterization/PCI is needed in the future, involvement of anesthesia will need to be considered.     TTE 03/16/2019:  1. The left ventricle has normal systolic function with an ejection  fraction of 60-65%. The cavity size was normal. Left ventricular diastolic  Doppler parameters are  consistent with impaired relaxation.   2. The right ventricle has normal systolic function. The cavity was  normal. There is no increase in right ventricular wall thickness.Unable to  estimate RVSP.     Past Medical History:  Diagnosis Date   Anxiety    Arthritis    Bilateral shoulders   Asthma    Bipolar 1 disorder ([place])    Bipolar disorder ([place])    CAD (coronary artery disease)    s/p stent BMS OM Cx   Cervical herniated disc 04/12/2016   COPD (chronic obstructive pulmonary disease) ([place])    Depression    Diabetes mellitus with gastroparesis (HCC)    Diabetes mellitus without complication (HCC)    no meds, patient states Borderline"   Diverticulitis    GERD (gastroesophageal reflux disease)    Glaucoma    Hearing loss    bilateral   History of blood transfusion    Hyperlipidemia    Hypertension    Lower back pain    OSA (obstructive sleep apnea)    not using cpap    Plantar fasciitis    bilateral foot pain, b/l feet s/p steroid shots w/o help and left surgery w/o help   PONV (postoperative nausea and vomiting)    Spinal headache    UTI (urinary tract infection)     Past Surgical History:  Procedure Laterality Date   [place]  Bowel resection.   CARDIAC CATHETERIZATION N/A 04/14/2016   Procedure: Left Heart Cath and Coronary Angiography;  Surgeon: Burnell Blanks, MD;  Location: [place] [facility] LAB;  Service: Cardiovascular;  Laterality: N/A;   CARDIAC SURGERY     COLONOSCOPY WITH PROPOFOL N/A 07/11/2019   Procedure: COLONOSCOPY WITH PROPOFOL;  Surgeon: Jonathon Bellows, MD;  Location[facility] ENDOSCOPY;  Service: Gastroenterology;  Laterality: N/A;   COLONOSCOPY WITH PROPOFOL N/A 07/29/2019   Procedure: COLONOSCOPY WITH PROPOFOL;  Surgeon: Jonathon Bellows, MD;  Location[facility] ENDOSCOPY;  Service: Gastroenterology;  Laterality: N/A;   CORONARY ANGIOPLASTY WITH STENT PLACEMENT  2010   Drug eluting stent    ESOPHAGOGASTRODUODENOSCOPY (EGD) WITH PROPOFOL N/A 07/11/2019   Procedure: ESOPHAGOGASTRODUODENOSCOPY (EGD) WITH PROPOFOL;  Surgeon: Jonathon Bellows, MD;  Location: [facility] ENDOSCOPY;  Service: Gastroenterology;  Laterality: N/A;   IR 3D INDEPENDENT WKST  11/08/2021   IR 3D INDEPENDENT WKST  12/25/2021   IR ANGIO INTRA EXTRACRAN SEL COM CAROTID INNOMINATE BILAT MOD SED  11/08/2021   IR ANGIO INTRA EXTRACRAN SEL COM CAROTID INNOMINATE UNI R MOD SED  12/25/2021   IR ANGIO VERTEBRAL SEL VERTEBRAL BILAT MOD SED  11/08/2021   IR ANGIOGRAM FOLLOW UP STUDY  12/25/2021   IR ANGIOGRAM FOLLOW UP STUDY  12/25/2021   IR ANGIOGRAM FOLLOW UP STUDY  12/25/2021   IR NEURO EACH ADD'L AFTER BASIC UNI RIGHT (MS)  12/25/2021   IR RADIOLOGIST EVAL & MGMT  10/16/2021   IR TRANSCATH/EMBOLIZ  12/25/2021   IR US GUIDE VASC ACCESS RIGHT  11/08/2021   IR US GUIDE VASC ACCESS RIGHT  12/25/2021   LEFT HEART CATH AND CORONARY ANGIOGRAPHY Left 09/11/2020   Procedure: LEFT HEART CATH AND CORONARY ANGIOGRAPHY;  Surgeon: Nelva Bush, MD;  Location: [place] CV LAB;  Service: Cardiovascular;  Laterality: Left;   OVARIAN CYST REMOVAL     RADIOLOGY WITH ANESTHESIA N/A 11/08/2021   Procedure: Cyril Loosen;  Surgeon: Shawna Bras, MD;  Location: [facility];  Service: Radiology;  Laterality: N/A;   RADIOLOGY WITH ANESTHESIA N/A 12/25/2021   Procedure: EMBOLIZATION;  Surgeon: Shawna Bras, MD;  Location: [place];  Service: Radiology;  Laterality: N/A;    MEDICATIONS: No current facility-administered medications for this encounter.    Budeson-Glycopyrrol-Formoterol (BREZTRI AEROSPHERE) 160-9-4.8 MCG/ACT AERO   esomeprazole (NEXIUM) 40 MG capsule   famotidine (PEPCID) 40 MG tablet   isosorbide mononitrate (IMDUR) 60 MG 24 hr tablet   nitroGLYCERIN (NITROSTAT) 0.4 MG SL tablet   albuterol (PROAIR HFA) 108 (90 Base) MCG/ACT inhaler   alum & mag hydroxide-simeth (MAALOX/MYLANTA) 200-200-20 MG/5ML suspension   amLODipine (NORVASC) 10 MG  tablet   aspirin 81 MG chewable tablet   atorvastatin (LIPITOR) 80 MG tablet   baclofen (LIORESAL) 10 MG tablet   bismuth subsalicylate (PEPTO BISMOL) 262 MG/15ML suspension   clopidogrel (PLAVIX) 75 MG tablet   dicyclomine (BENTYL) 20 MG tablet   divalproex (DEPAKOTE) 500 MG DR tablet   Evolocumab (REPATHA SURECLICK) 694 MG/ML SOAJ   ezetimibe (ZETIA) 10 MG tablet   fluticasone (FLONASE) 50 MCG/ACT nasal spray   hydrochlorothiazide (MICROZIDE) 12.5 MG capsule   ipratropium (ATROVENT) 0.06 % nasal spray   Ipratropium-Albuterol (COMBIVENT RESPIMAT) 20-100 MCG/ACT AERS respimat   ipratropium-albuterol (DUONEB) 0.5-2.5 (3) MG/3ML SOLN   levocetirizine (XYZAL) 5 MG tablet   methocarbamol (ROBAXIN) 500 MG tablet   metoCLOPramide (REGLAN) 5 MG tablet   naloxone (NARCAN) nasal spray 4 mg/0.1 mL   olmesartan (BENICAR) 20 MG tablet   omeprazole (PRILOSEC) 20  MG capsule   ondansetron (ZOFRAN-ODT) 4 MG disintegrating tablet   pantoprazole (PROTONIX) 40 MG tablet   potassium chloride (MICRO-K) 10 MEQ CR capsule   Probiotic Product (PROBIOTIC DAILY PO)   promethazine (PHENERGAN) 25 MG tablet   sodium chloride (OCEAN) 0.65 % SOLN nasal spray   topiramate (TOPAMAX) 50 MG tablet   traMADol (ULTRAM) 50 MG tablet    Myra Gianotti, PA-C Surgical Short Stay/Anesthesiology [facility] Phone [phone] [facility] Phone [phone] 03/28/2022 12:36 PM

## 2022-03-29 NOTE — H&P (Signed)
  The note originally documented on this encounter has been moved the the encounter in which it belongs.  

## 2022-03-29 NOTE — H&P (Signed)
Chief Complaint: Patient was seen in consultation today for cerebral angiogram with intervention for the R MCA aneurysm at the request of McLean-Scocuzza, Olivia Mackie  Referring Physician(s): McLean-Scocuzza, Olivia Mackie  Supervising Physician: Luanne Bras  Patient Status: [facility] - Out-pt  History of Present Illness: Shawna Anderson is a 56 y.o. female with PMHs of CAD s/pci LCX, HTN, HLD, GERD, OSA, substance abuse, R PCA and R MCA intracranial aneurysms s/p endovascular coil embolization of the right posterior communicating artery aneurysm by Dr. Estanislado Pandy on 12/25/21, she presents today for endovascular treatment of the R MCA aneurysm.   Patient developed HA and underwent MRA and CTA brain in early 2023 which showed right PCA and right MCA aneurysm. Patient underwent diagnostic cerebral angiogram on 4/28, and underwent coil embolization of the R PCA aneurysm on 6/14. She was discharged in stable condition on 6/15 and she presented to [place] on 01/20/2022 for treatment of the right MCA aneurysm, the procedure was canceled due to leukocytosis.  The procedure has been rescheduled and patient presents to [place] today for treatment of the right MCA aneurysm.  Patient seen in Short Stay with Dr. Estanislado Pandy.  Patient laying in bed, not in acute distress, appears to be sleepy.  Denies no new neurological symptoms since last visit.  Denise headache, fever, chills, shortness of breath, cough, chest pain, abdominal pain, nausea ,vomiting, and bleeding.  Most recent P2 Y12 1 03/26/2022 was 4. Patient was instructed to take aspirin 81 mg and Plavix 37.5 mg once a day, she confirms that she has been taking aspirin and Plavix as instructed.  Past Medical History:  Diagnosis Date   Anxiety    Arthritis    Bilateral shoulders   Asthma    Bipolar 1 disorder ([place])    Bipolar disorder ([place])    CAD (coronary artery disease)    s/p stent BMS OM Cx   Cervical herniated disc 04/12/2016   COPD (chronic  obstructive pulmonary disease) ([place])    Depression    Diabetes mellitus with gastroparesis (HCC)    Diabetes mellitus without complication (HCC)    no meds, patient states Borderline"   Diverticulitis    GERD (gastroesophageal reflux disease)    Glaucoma    Hearing loss    bilateral   History of blood transfusion    Hyperlipidemia    Hypertension    Lower back pain    OSA (obstructive sleep apnea)    not using cpap    Plantar fasciitis    bilateral foot pain, b/l feet s/p steroid shots w/o help and left surgery w/o help   PONV (postoperative nausea and vomiting)    Spinal headache    UTI (urinary tract infection)     Past Surgical History:  Procedure Laterality Date   ABDOMINAL HYSTERECTOMY     ABDOMINAL SURGERY  1995   Bowel resection.   CARDIAC CATHETERIZATION N/A 04/14/2016   Procedure: Left Heart Cath and Coronary Angiography;  Surgeon: Burnell Blanks, MD;  Location: [place] [facility];  Service: Cardiovascular;  Laterality: N/A;   CARDIAC SURGERY     COLONOSCOPY WITH PROPOFOL N/A 07/11/2019   Procedure: COLONOSCOPY WITH PROPOFOL;  Surgeon: Jonathon Bellows, MD;  Location: [facility] ENDOSCOPY;  Service: Gastroenterology;  Laterality: N/A;   COLONOSCOPY WITH PROPOFOL N/A 07/29/2019   Procedure: COLONOSCOPY WITH PROPOFOL;  Surgeon: Jonathon Bellows, MD;  Location: [facility] ENDOSCOPY;  Service: Gastroenterology;  Laterality: N/A;   CORONARY ANGIOPLASTY WITH STENT PLACEMENT  2010   Drug eluting  stent   ESOPHAGOGASTRODUODENOSCOPY (EGD) WITH PROPOFOL N/A 07/11/2019   Procedure: ESOPHAGOGASTRODUODENOSCOPY (EGD) WITH PROPOFOL;  Surgeon: Jonathon Bellows, MD;  Location: [facility]COPY;  Service: Gastroenterology;  Laterality: N/A;   IR 3D INDEPENDENT WKST  11/08/2021   IR 3D INDEPENDENT WKST  12/25/2021   IR ANGIO INTRA EXTRACRAN SEL COM CAROTID INNOMINATE BILAT MOD SED  11/08/2021   IR ANGIO INTRA EXTRACRAN SEL COM CAROTID INNOMINATE UNI R MOD SED  12/25/2021   IR ANGIO VERTEBRAL SEL VERTEBRAL BILAT  MOD SED  11/08/2021   IR ANGIOGRAM FOLLOW UP STUDY  12/25/2021   IR ANGIOGRAM FOLLOW UP STUDY  12/25/2021   IR ANGIOGRAM FOLLOW UP STUDY  12/25/2021   IR NEURO EACH ADD'L AFTER BASIC UNI RIGHT (MS)  12/25/2021   IR RADIOLOGIST EVAL & MGMT  10/16/2021   IR TRANSCATH/EMBOLIZ  12/25/2021   IR US GUIDE VASC ACCESS RIGHT  11/08/2021   IR US GUIDE VASC ACCESS RIGHT  12/25/2021   LEFT HEART CATH AND CORONARY ANGIOGRAPHY Left 09/11/2020   Procedure: LEFT HEART CATH AND CORONARY ANGIOGRAPHY;  Surgeon: Nelva Bush, MD;  Location: [place] [facility];  Service: Cardiovascular;  Laterality: Left;   OVARIAN CYST REMOVAL     RADIOLOGY WITH ANESTHESIA N/A 11/08/2021   Procedure: Cyril Loosen;  Surgeon: Luanne Bras, MD;  Location: [place];  Service: Radiology;  Laterality: N/A;   RADIOLOGY WITH ANESTHESIA N/A 12/25/2021   Procedure: EMBOLIZATION;  Surgeon: Luanne Bras, MD;  Location: [place];  Service: Radiology;  Laterality: N/A;    Allergies: Haloperidol lactate, Lorazepam, Augmentin [amoxicillin-pot clavulanate], Lactose intolerance (gi), Latex, Tape, Xifaxan [rifaximin], and Drixoral allergy sinus [dexbromphen-pse-apap er]  Medications: Prior to Admission medications   Medication Sig Start Date End Date Taking? Authorizing Provider  Acetylcysteine (NAC) 600 MG CAPS Take 1 capsule (600 mg total) by mouth 2 (two) times daily. prn Patient not taking: Reported on 01/09/2022 12/06/21   McLean-Scocuzza, Nino Glow, MD  albuterol (PROAIR HFA) 108 (90 Base) MCG/ACT inhaler Inhale 1-2 puffs into the lungs every 4 (four) hours as needed for wheezing or shortness of breath. Patient taking differently: Inhale 1-2 puffs into the lungs 2 (two) times daily. 11/28/21   McLean-Scocuzza, Nino Glow, MD  alum & mag hydroxide-simeth (MAALOX/MYLANTA) 200-200-20 MG/5ML suspension Take 30 mLs by mouth every 6 (six) hours as needed (Sulfa Burps).    [provider]  amLODipine (NORVASC) 10 MG tablet TAKE 1 TABLET BY MOUTH  AT  BEDTIME 04/19/21   McLean-Scocuzza, Nino Glow, MD  aspirin 81 MG chewable tablet Chew 81 mg by mouth daily.    [provider]  atorvastatin (LIPITOR) 80 MG tablet TAKE 1 TABLET BY MOUTH  DAILY AT 6 PM. 05/15/21   McLean-Scocuzza, Nino Glow, MD  baclofen (LIORESAL) 10 MG tablet Take 10 mg by mouth 2 (two) times daily. 11/22/21 05/21/22  [provider]  bismuth subsalicylate (PEPTO BISMOL) 262 MG/15ML suspension Take 30 mLs by mouth every 6 (six) hours as needed (Sulfa burps).    [provider]  Budeson-Glycopyrrol-Formoterol (BREZTRI AEROSPHERE) 160-9-4.8 MCG/ACT AERO Inhale 2 puffs into the lungs in the morning and at bedtime. Rinse 06/13/21   Tyler Pita, MD  clopidogrel (PLAVIX) 75 MG tablet TAKE 1 TABLET BY MOUTH  DAILY Patient taking differently: Take 37.5 mg by mouth daily. 05/01/21   Crecencio Mc, MD  dicyclomine (BENTYL) 20 MG tablet Take 1 tablet (20 mg total) by mouth 4 (four) times daily -  before meals and at bedtime.  Patient taking differently: Take 20 mg by mouth daily as needed (Vomiting). 09/12/21   McLean-Scocuzza, Nino Glow, MD  divalproex (DEPAKOTE) 500 MG DR tablet TAKE 1 TABLET BY MOUTH  TWICE DAILY 08/12/21   McLean-Scocuzza, Nino Glow, MD  ezetimibe (ZETIA) 10 MG tablet TAKE 1 TABLET BY MOUTH  DAILY WITH LIPITOR 80 MG AT NIGHT 10/16/21   McLean-Scocuzza, Nino Glow, MD  fluticasone (FLONASE) 50 MCG/ACT nasal spray Place 2 sprays into both nostrils daily as needed for allergies or rhinitis. Patient taking differently: Place 2 sprays into both nostrils daily. 12/27/21   McLean-Scocuzza, Nino Glow, MD  gabapentin (NEURONTIN) 300 MG capsule Take 300 mg by mouth at bedtime. 01/01/22   [provider]  ipratropium (ATROVENT) 0.06 % nasal spray USE 2 SPRAYS IN BOTH  NOSTRILS 3 TIMES DAILY Patient taking differently: Place 2 sprays into both nostrils daily. 11/25/21   McLean-Scocuzza, Nino Glow, MD  Ipratropium-Albuterol (COMBIVENT RESPIMAT) 20-100 MCG/ACT AERS  respimat Inhale 2 puffs into the lungs 2 (two) times daily.    [provider]  ipratropium-albuterol (DUONEB) 0.5-2.5 (3) MG/3ML SOLN Take 3 mLs by nebulization every 6 (six) hours as needed. Patient taking differently: Take 3 mLs by nebulization every 6 (six) hours as needed (Shortness of breath & bronchitis). 11/28/21   McLean-Scocuzza, Nino Glow, MD  isosorbide mononitrate (IMDUR) 60 MG 24 hr tablet Take 90 mg by mouth daily.    [provider]  levocetirizine (XYZAL) 5 MG tablet Take 5 mg by mouth at bedtime as needed for allergies. 10/25/20   [provider]  methocarbamol (ROBAXIN) 500 MG tablet Take 1 tablet (500 mg total) by mouth 2 (two) times daily as needed for muscle spasms. 02/07/21   McLean-Scocuzza, Nino Glow, MD  metoCLOPramide (REGLAN) 5 MG tablet TAKE 1 TABLET BY MOUTH 4  TIMES DAILY BEFORE MEALS  AND AT BEDTIME Patient taking differently: Take 5 mg by mouth 4 (four) times daily -  before meals and at bedtime. 06/10/21   Lin Landsman, MD  naloxone [facility]) nasal spray 4 mg/0.1 mL Place 4 mg into the nose as needed for opioid reversal. 01/02/22   [provider]  nitroGLYCERIN (NITROSTAT) 0.4 MG SL tablet DISSOLVE 1 TABLET UNDER  TONGUE EVERY 5 MINUTES AS  NEEDED FOR CHEST PAIN MAX  OF 3 TABLETS IN 15 MINUTES  . CALL 911 AFTER THIRD DOSE 10/16/21   Loel Dubonnet, NP  olmesartan-hydrochlorothiazide (BENICAR HCT) 20-12.5 MG tablet Take 1 tablet by mouth daily. In am if BP >130/>80 take 2 pills to equal 40-25 mg qd 10/01/21   McLean-Scocuzza, Nino Glow, MD  ondansetron (ZOFRAN-ODT) 4 MG disintegrating tablet DISSOLVE 1 TABLET ON TOP OF THE  TONGUE EVERY 8 HOURS AS NEEDED  FOR NAUEA OR VOMITING 11/21/21   McLean-Scocuzza, Nino Glow, MD  pantoprazole (PROTONIX) 40 MG tablet TAKE 1 TABLET BY MOUTH  TWICE DAILY BEFORE MEALS 06/10/21   Jonathon Bellows, MD  potassium chloride (MICRO-K) 10 MEQ CR capsule Take 2 capsules (20 mEq total) by mouth daily. 12/06/21    McLean-Scocuzza, Nino Glow, MD  Probiotic Product (PROBIOTIC DAILY PO) Take 200 mg by mouth daily.    [provider]  promethazine (PHENERGAN) 25 MG tablet TAKE 1 TABLET BY MOUTH TWICE  DAILY AS NEEDED FOR NAUSEA OR  VOMITING 11/26/21   McLean-Scocuzza, Nino Glow, MD  REPATHA SURECLICK 694 MG/ML SOAJ INJECT '140MG'$  SUBCUTANEOUSLY  EVERY 2 WEEKS 10/14/21   End, Harrell Gave, MD  sodium chloride (OCEAN) 0.65 %  SOLN nasal spray Place 2 sprays into both nostrils daily as needed for congestion. Patient not taking: Reported on 01/09/2022 12/27/21   McLean-Scocuzza, Nino Glow, MD  topiramate (TOPAMAX) 50 MG tablet Take 50 mg by mouth at bedtime.    [provider]  traMADol (ULTRAM) 50 MG tablet Take 50-100 mg by mouth every 8 (eight) hours as needed for moderate pain or severe pain. 01/02/22   [provider]     Family History  Problem Relation Age of Onset   CAD Mother    Depression Mother    Heart disease Mother    Hyperlipidemia Mother    Hypertension Mother    Heart disease Father    Alcohol abuse Father    Cancer Brother 84       brain   CAD Brother    Depression Brother    Diabetes Brother    Heart disease Brother    Hyperlipidemia Brother    Lupus Other    Sickle cell anemia Other     Social History   Socioeconomic History   Marital status: Single    Spouse name: Not on file   Number of children: 1   Years of education: Not on file   Highest education level: Not on file  Occupational History   Not on file  Tobacco Use   Smoking status: Former    Packs/day: 1.00    Years: 30.00    Total pack years: 30.00    Types: Cigarettes    Quit date: 12/31/2020    Years since quitting: 1.2   Smokeless tobacco: Never   Tobacco comments:    Quit after being admitted on 6/20 01/17/2021  Vaping Use   Vaping Use: Former  Substance and Sexual Activity   Alcohol use: Not Currently    Comment: once a year   Drug use: Yes    Types: Marijuana    Comment: Uses Daily    Sexual activity: Not Currently    Birth control/protection: Surgical    Comment: Hysterectomy  Other Topics Concern   Not on file  Social History Narrative   From [place] now living in [place]    1 son    No guns    Wears seat belt   Safe in relationship    Social Determinants of Health   Financial Resource Strain: Low Risk  (10/16/2021)   Overall Financial Resource Strain (CARDIA)    Difficulty of Paying Living Expenses: Not very hard  Food Insecurity: No Food Insecurity (10/16/2021)   Hunger Vital Sign    Worried About Running Out of Food in the Last Year: Never true    [place] in the Last Year: Never true  Transportation Needs: No Transportation Needs (10/16/2021)   PRAPARE - Hydrologist (Medical): No    Lack of Transportation (Non-Medical): No  Physical Activity: Not on file  Stress: No Stress Concern Present (10/16/2021)   [place]    Feeling of Stress : Not at all  Social Connections: Unknown (10/16/2021)   Social Connection and Isolation Panel [NHANES]    Frequency of Communication with Friends and Family: More than three times a week    Frequency of Social Gatherings with Friends and Family: More than three times a week    Attends Religious Services: Not on file    Active Member of Clubs or Organizations: Not on file  Attends Archivist Meetings: Not on file    Marital Status: Not on file     Review of Systems: A 12 point ROS discussed and pertinent positives are indicated in the HPI above.  All other systems are negative.  Vital Signs: There were no vitals taken for this visit.   Physical Exam Vitals and nursing note reviewed.  Constitutional:      General: She is not in acute distress.    Appearance: Normal appearance. She is not ill-appearing.  HENT:     Head: Normocephalic.     Mouth/Throat:     Mouth: Mucous membranes are moist.      Pharynx: Oropharynx is clear.  Cardiovascular:     Rate and Rhythm: Normal rate and regular rhythm.     Heart sounds: Normal heart sounds.     Comments: R RA 2+, Bilateral DP 2+  Pulmonary:     Effort: Pulmonary effort is normal.     Breath sounds: Normal breath sounds.  Abdominal:     General: Abdomen is flat. Bowel sounds are normal.     Palpations: Abdomen is soft.  Musculoskeletal:     Cervical back: Neck supple.  Skin:    General: Skin is warm.     Coloration: Skin is not jaundiced or pale.  Neurological:     Mental Status: She is alert and oriented to person, place, and time.     Comments: Alert, awake, and oriented x 3 Speech and comprehension intact EOMs intact no facial asymmetry. Tongue midline  Motor power intact all 4 No pronator drift. DP 2+ bilaterally    Psychiatric:        Mood and Affect: Mood normal.        Behavior: Behavior normal.        Judgment: Judgment normal.        Imaging: No results found.  Labs:  CBC: Recent Labs    11/25/21 0708 12/25/21 0641 12/26/21 0500 01/20/22 0745  WBC 12.4* 8.1 8.2 14.8*  HGB 12.0 11.7* 10.4* 10.6*  HCT 35.6* 36.1 32.3* 32.4*  PLT 420* 319 301 342     COAGS: Recent Labs    11/08/21 0657 11/25/21 0708 12/25/21 0641 01/20/22 0711  INR 1.0 0.9 0.9 1.0     BMP: Recent Labs    11/25/21 0708 12/25/21 0641 12/26/21 0500 01/20/22 0711  NA 137 140 141 139  K 3.0* 4.2 3.4* 4.1  CL 107 108 108 107  CO2 21* 24 24 21*  GLUCOSE 134* 115* 124* 116*  BUN '13 20 11 '$ 28*  CALCIUM 8.9 8.6* 7.9* 9.1  CREATININE 0.79 0.79 0.78 0.85  GFRNONAA >60 >60 >60 >60     LIVER FUNCTION TESTS: Recent Labs    04/22/21 1047 04/24/21 0711 08/21/21 1002  BILITOT 0.7 0.5 0.2  AST 27 19 41*  ALT 81* 43 58*  ALKPHOS 86 61 116  PROT 7.1 6.2* 6.6  ALBUMIN 4.1 3.5 4.6     TUMOR MARKERS: No results for input(s): "AFPTM", "CEA", "CA199", "CHROMGRNA" in the last 8760 hours.  Assessment and Plan: 56 y.o.  female with R PCA and R MCA aneurysms s/p coil embolization of the R PCA on 6/14, she presents today for R MCA aneurysm treatment with Dr. Estanislado Pandy.   NPO since MN VSS Labs pending. Glucose 125  She confirms that she has been taking ASA 81 mg qd and Plavix 37.5 mg qd  as instructed.  2 g Ancef ordered.   Risks  and benefits of cerebral angiogram with intervention were discussed with the patient including, but not limited to bleeding, infection, vascular injury, contrast induced renal failure, stroke or even death.  This interventional procedure involves the use of X-rays and because of the nature of the planned procedure, it is possible that we will have prolonged use of X-ray fluoroscopy.  Potential radiation risks to you include (but are not limited to) the following: - A slightly elevated risk for cancer  several years later in life. This risk is typically less than 0.5% percent. This risk is low in comparison to the normal incidence of human cancer, which is 33% for women and 50% for men according to the [place]. - Radiation induced injury can include skin redness, resembling a rash, tissue breakdown / ulcers and hair loss (which can be temporary or permanent).   The likelihood of either of these occurring depends on the difficulty of the procedure and whether you are sensitive to radiation due to previous procedures, disease, or genetic conditions.   IF your procedure requires a prolonged use of radiation, you will be notified and given written instructions for further action.  It is your responsibility to monitor the irradiated area for the 2 weeks following the procedure and to notify your physician if you are concerned that you have suffered a radiation induced injury.    All of the patient's questions were answered, patient is agreeable to proceed.  Consent signed and in chart. Patient will be admitted to NICU for overnight observation.    Thank you for this  interesting consult.  I greatly enjoyed meeting Glades and look forward to participating in their care.  A copy of this report was sent to the requesting provider on this date.  Electronically Signed: Tera Mater, PA-C 03/29/2022, 8:35 AM   I spent a total of  30 minutes in face to face in clinical consultation, greater than 50% of which was counseling/coordinating care for R MCA aneurysm.   This chart was dictated using voice recognition software.  Despite best efforts to proofread,  errors can occur which can change the documentation meaning.

## 2022-03-31 ENCOUNTER — Encounter (HOSPITAL_COMMUNITY)
Admission: RE | Disposition: A | Discharge status: Home / Self Care | Payer: Self-pay | Source: Ambulatory Visit | Attending: Interventional Radiology

## 2022-03-31 ENCOUNTER — Inpatient Hospital Stay (HOSPITAL_COMMUNITY)
Admission: RE | Admit: 2022-03-31 | Discharge: 2022-03-31 | Disposition: A | Discharge status: Home / Self Care | Payer: Medicare Other | Source: Ambulatory Visit | Attending: Interventional Radiology | Admitting: Interventional Radiology

## 2022-03-31 ENCOUNTER — Inpatient Hospital Stay (HOSPITAL_COMMUNITY): Payer: Medicare Other | Admitting: Vascular Surgery

## 2022-03-31 ENCOUNTER — Other Ambulatory Visit: Payer: Self-pay

## 2022-03-31 ENCOUNTER — Encounter (HOSPITAL_COMMUNITY): Payer: Self-pay | Admitting: Interventional Radiology

## 2022-03-31 ENCOUNTER — Other Ambulatory Visit (HOSPITAL_COMMUNITY): Payer: Medicare Other

## 2022-03-31 ENCOUNTER — Encounter (HOSPITAL_COMMUNITY): Payer: Self-pay

## 2022-03-31 ENCOUNTER — Inpatient Hospital Stay (HOSPITAL_COMMUNITY)
Admission: RE | Admit: 2022-03-31 | Discharge: 2022-04-01 | DRG: 027 | Disposition: A | Discharge status: Home / Self Care | Payer: Medicare Other | Source: Ambulatory Visit | Attending: Interventional Radiology | Admitting: Interventional Radiology

## 2022-03-31 DIAGNOSIS — M545 Low back pain, unspecified: Secondary | ICD-10-CM | POA: Diagnosis present

## 2022-03-31 DIAGNOSIS — E1143 Type 2 diabetes mellitus with diabetic autonomic (poly)neuropathy: Secondary | ICD-10-CM | POA: Diagnosis not present

## 2022-03-31 DIAGNOSIS — H9193 Unspecified hearing loss, bilateral: Secondary | ICD-10-CM | POA: Diagnosis not present

## 2022-03-31 DIAGNOSIS — Z881 Allergy status to other antibiotic agents status: Secondary | ICD-10-CM | POA: Diagnosis not present

## 2022-03-31 DIAGNOSIS — Z888 Allergy status to other drugs, medicaments and biological substances status: Secondary | ICD-10-CM | POA: Diagnosis not present

## 2022-03-31 DIAGNOSIS — F319 Bipolar disorder, unspecified: Secondary | ICD-10-CM | POA: Diagnosis present

## 2022-03-31 DIAGNOSIS — I10 Essential (primary) hypertension: Secondary | ICD-10-CM | POA: Diagnosis present

## 2022-03-31 DIAGNOSIS — K219 Gastro-esophageal reflux disease without esophagitis: Secondary | ICD-10-CM | POA: Diagnosis not present

## 2022-03-31 DIAGNOSIS — I729 Aneurysm of unspecified site: Principal | ICD-10-CM

## 2022-03-31 DIAGNOSIS — Z818 Family history of other mental and behavioral disorders: Secondary | ICD-10-CM | POA: Diagnosis not present

## 2022-03-31 DIAGNOSIS — Z8249 Family history of ischemic heart disease and other diseases of the circulatory system: Secondary | ICD-10-CM | POA: Diagnosis not present

## 2022-03-31 DIAGNOSIS — Z91040 Latex allergy status: Secondary | ICD-10-CM | POA: Diagnosis not present

## 2022-03-31 DIAGNOSIS — H409 Unspecified glaucoma: Secondary | ICD-10-CM | POA: Diagnosis not present

## 2022-03-31 DIAGNOSIS — I671 Cerebral aneurysm, nonruptured: Secondary | ICD-10-CM | POA: Diagnosis not present

## 2022-03-31 DIAGNOSIS — Z87891 Personal history of nicotine dependence: Secondary | ICD-10-CM

## 2022-03-31 DIAGNOSIS — G4733 Obstructive sleep apnea (adult) (pediatric): Secondary | ICD-10-CM | POA: Diagnosis not present

## 2022-03-31 DIAGNOSIS — K3184 Gastroparesis: Secondary | ICD-10-CM | POA: Diagnosis not present

## 2022-03-31 DIAGNOSIS — J449 Chronic obstructive pulmonary disease, unspecified: Secondary | ICD-10-CM

## 2022-03-31 DIAGNOSIS — Z83438 Family history of other disorder of lipoprotein metabolism and other lipidemia: Secondary | ICD-10-CM

## 2022-03-31 DIAGNOSIS — I2510 Atherosclerotic heart disease of native coronary artery without angina pectoris: Secondary | ICD-10-CM | POA: Diagnosis present

## 2022-03-31 DIAGNOSIS — E785 Hyperlipidemia, unspecified: Secondary | ICD-10-CM | POA: Diagnosis not present

## 2022-03-31 DIAGNOSIS — Z9889 Other specified postprocedural states: Secondary | ICD-10-CM | POA: Diagnosis present

## 2022-03-31 DIAGNOSIS — G8929 Other chronic pain: Secondary | ICD-10-CM | POA: Diagnosis present

## 2022-03-31 DIAGNOSIS — Z833 Family history of diabetes mellitus: Secondary | ICD-10-CM

## 2022-03-31 DIAGNOSIS — Z811 Family history of alcohol abuse and dependence: Secondary | ICD-10-CM

## 2022-03-31 DIAGNOSIS — M25511 Pain in right shoulder: Secondary | ICD-10-CM | POA: Diagnosis present

## 2022-03-31 DIAGNOSIS — E739 Lactose intolerance, unspecified: Secondary | ICD-10-CM | POA: Diagnosis present

## 2022-03-31 HISTORY — PX: IR ANGIOGRAM FOLLOW UP STUDY: IMG697

## 2022-03-31 HISTORY — PX: IR ANGIO VERTEBRAL SEL SUBCLAVIAN INNOMINATE UNI R MOD SED: IMG5365

## 2022-03-31 HISTORY — PX: IR 3D INDEPENDENT WKST: IMG2385

## 2022-03-31 HISTORY — PX: IR ANGIO INTRA EXTRACRAN SEL INTERNAL CAROTID UNI R MOD SED: IMG5362

## 2022-03-31 HISTORY — PX: IR CT HEAD LTD: IMG2386

## 2022-03-31 HISTORY — PX: IR ANGIO EXTERNAL CAROTID SEL EXT CAROTID UNI R MOD SED: IMG5371

## 2022-03-31 HISTORY — PX: RADIOLOGY WITH ANESTHESIA: SHX6223

## 2022-03-31 HISTORY — DX: Low back pain, unspecified: M54.50

## 2022-03-31 HISTORY — PX: IR NEURO EACH ADD'L AFTER BASIC UNI RIGHT (MS): IMG5374

## 2022-03-31 HISTORY — PX: IR TRANSCATH/EMBOLIZ: IMG695

## 2022-03-31 HISTORY — DX: Unspecified hearing loss, unspecified ear: H91.90

## 2022-03-31 LAB — BASIC METABOLIC PANEL
Anion gap: 7 (ref 5–15)
BUN: 28 mg/dL — ABNORMAL HIGH (ref 6–20)
CO2: 27 mmol/L (ref 22–32)
Calcium: 8.7 mg/dL — ABNORMAL LOW (ref 8.9–10.3)
Chloride: 108 mmol/L (ref 98–111)
Creatinine, Ser: 0.74 mg/dL (ref 0.44–1.00)
GFR, Estimated: >60 mL/min (ref >60)
Glucose, Bld: 112 mg/dL — ABNORMAL HIGH (ref 70–99)
Potassium: 3.5 mmol/L (ref 3.5–5.1)
Sodium: 142 mmol/L (ref 135–145)

## 2022-03-31 LAB — CBC WITH DIFFERENTIAL/PLATELET
Abs Immature Granulocytes: 0.07 10*3/uL (ref 0.00–0.07)
Basophils Absolute: 0.1 10*3/uL (ref 0.0–0.1)
Basophils Relative: 1 %
Eosinophils Absolute: 0.3 10*3/uL (ref 0.0–0.5)
Eosinophils Relative: 2 %
HCT: 33.9 % — ABNORMAL LOW (ref 36.0–46.0)
Hemoglobin: 11.2 g/dL — ABNORMAL LOW (ref 12.0–15.0)
Immature Granulocytes: 1 %
Lymphocytes Relative: 27 %
Lymphs Abs: 3.4 10*3/uL (ref 0.7–4.0)
MCH: 29.2 pg (ref 26.0–34.0)
MCHC: 33 g/dL (ref 30.0–36.0)
MCV: 88.3 fL (ref 80.0–100.0)
Monocytes Absolute: 0.9 10*3/uL (ref 0.1–1.0)
Monocytes Relative: 7 %
Neutro Abs: 7.7 10*3/uL (ref 1.7–7.7)
Neutrophils Relative %: 62 %
Platelets: 370 10*3/uL (ref 150–400)
RBC: 3.84 MIL/uL — ABNORMAL LOW (ref 3.87–5.11)
RDW: 14.8 % (ref 11.5–15.5)
WBC: 12.4 10*3/uL — ABNORMAL HIGH (ref 4.0–10.5)
nRBC: 0 % (ref 0.0–0.2)

## 2022-03-31 LAB — PROTIME-INR
INR: 0.9 (ref 0.8–1.2)
Prothrombin Time: 12.3 seconds (ref 11.4–15.2)

## 2022-03-31 LAB — GLUCOSE, CAPILLARY
Glucose-Capillary: 125 mg/dL — ABNORMAL HIGH (ref 70–99)
Glucose-Capillary: 119 mg/dL — ABNORMAL HIGH (ref 70–99)
Glucose-Capillary: 116 mg/dL — ABNORMAL HIGH (ref 70–99)
Glucose-Capillary: 115 mg/dL — ABNORMAL HIGH (ref 70–99)

## 2022-03-31 LAB — HEPARIN LEVEL (UNFRACTIONATED): Heparin Unfractionated: <0.1 IU/mL — ABNORMAL LOW (ref 0.30–0.70)

## 2022-03-31 SURGERY — IR WITH ANESTHESIA
Anesthesia: General

## 2022-03-31 MED ORDER — CLEVIDIPINE BUTYRATE 0.5 MG/ML IV EMUL
INTRAVENOUS | Status: AC
  Administered 2022-03-31: 2 mg/h via INTRAVENOUS
  Filled 2022-03-31: qty 100

## 2022-03-31 MED ORDER — ROCURONIUM BROMIDE 10 MG/ML (PF) SYRINGE
PREFILLED_SYRINGE | INTRAVENOUS | Status: DC | PRN
  Administered 2022-03-31: 50 mg via INTRAVENOUS
  Administered 2022-03-31: 30 mg via INTRAVENOUS

## 2022-03-31 MED ORDER — PANTOPRAZOLE SODIUM 40 MG PO TBEC
40.0000 mg | DELAYED_RELEASE_TABLET | Freq: Every day | ORAL | Status: DC
  Filled 2022-03-31: qty 1

## 2022-03-31 MED ORDER — OXYCODONE HCL 5 MG PO TABS
7.5000 mg | ORAL_TABLET | Freq: Once | ORAL | Status: AC
  Administered 2022-03-31: 7.5 mg via ORAL
  Filled 2022-03-31: qty 2

## 2022-03-31 MED ORDER — EZETIMIBE 10 MG PO TABS: 10.0000 mg | ORAL_TABLET | Freq: Every day | ORAL | Status: DC

## 2022-03-31 MED ORDER — EPTIFIBATIDE 20 MG/10ML IV SOLN
INTRAVENOUS | Status: AC | PRN
  Administered 2022-03-31 (×2): 1.5 mg via INTRAVENOUS

## 2022-03-31 MED ORDER — TRAMADOL HCL 50 MG PO TABS
50.0000 mg | ORAL_TABLET | Freq: Four times a day (QID) | ORAL | Status: DC
  Administered 2022-03-31 – 2022-04-01 (×3): 50 mg via ORAL
  Filled 2022-03-31 (×3): qty 1

## 2022-03-31 MED ORDER — HYDROCHLOROTHIAZIDE 12.5 MG PO TABS
12.5000 mg | ORAL_TABLET | Freq: Every day | ORAL | Status: DC
  Administered 2022-04-01: 12.5 mg via ORAL
  Filled 2022-03-31 (×2): qty 1

## 2022-03-31 MED ORDER — EPHEDRINE SULFATE-NACL 50-0.9 MG/10ML-% IV SOSY
PREFILLED_SYRINGE | INTRAVENOUS | Status: DC | PRN
  Administered 2022-03-31: 2.5 mg via INTRAVENOUS

## 2022-03-31 MED ORDER — ISOSORBIDE MONONITRATE ER 30 MG PO TB24
90.0000 mg | ORAL_TABLET | Freq: Every day | ORAL | Status: DC
  Administered 2022-04-01: 90 mg via ORAL
  Filled 2022-03-31: qty 3

## 2022-03-31 MED ORDER — LACTATED RINGERS IV SOLN: INTRAVENOUS | Status: DC | PRN

## 2022-03-31 MED ORDER — ASPIRIN 81 MG PO CHEW: 81.0000 mg | CHEWABLE_TABLET | Freq: Every day | ORAL | Status: DC

## 2022-03-31 MED ORDER — POTASSIUM CHLORIDE CRYS ER 20 MEQ PO TBCR
20.0000 meq | EXTENDED_RELEASE_TABLET | Freq: Every day | ORAL | Status: DC
  Filled 2022-03-31: qty 1

## 2022-03-31 MED ORDER — ONDANSETRON 4 MG PO TBDP: 4.0000 mg | ORAL_TABLET | Freq: Three times a day (TID) | ORAL | Status: DC | PRN

## 2022-03-31 MED ORDER — FENTANYL CITRATE (PF) 100 MCG/2ML IJ SOLN
INTRAMUSCULAR | Status: AC
  Filled 2022-03-31: qty 2

## 2022-03-31 MED ORDER — PHENYLEPHRINE HCL-NACL 20-0.9 MG/250ML-% IV SOLN
INTRAVENOUS | Status: DC | PRN
  Administered 2022-03-31: 40 ug/min via INTRAVENOUS

## 2022-03-31 MED ORDER — METOCLOPRAMIDE HCL 5 MG PO TABS
5.0000 mg | ORAL_TABLET | Freq: Three times a day (TID) | ORAL | Status: DC
  Administered 2022-03-31 – 2022-04-01 (×2): 5 mg via ORAL
  Filled 2022-03-31 (×3): qty 1

## 2022-03-31 MED ORDER — PROMETHAZINE HCL 25 MG PO TABS: 25.0000 mg | ORAL_TABLET | Freq: Two times a day (BID) | ORAL | Status: DC | PRN

## 2022-03-31 MED ORDER — KETOROLAC TROMETHAMINE 30 MG/ML IJ SOLN
INTRAMUSCULAR | Status: AC
  Administered 2022-03-31: 30 mg via INTRAVENOUS
  Filled 2022-03-31: qty 1

## 2022-03-31 MED ORDER — NITROGLYCERIN 1 MG/10 ML FOR IR/CATH LAB
INTRA_ARTERIAL | Status: AC | PRN
  Administered 2022-03-31: 25 ug via INTRA_ARTERIAL

## 2022-03-31 MED ORDER — GLYCOPYRROLATE PF 0.2 MG/ML IJ SOSY
PREFILLED_SYRINGE | INTRAMUSCULAR | Status: DC | PRN
  Administered 2022-03-31: .2 mg via INTRAVENOUS

## 2022-03-31 MED ORDER — PHENYLEPHRINE HCL-NACL 20-0.9 MG/250ML-% IV SOLN
25.0000 ug/min | INTRAVENOUS | Status: DC
  Filled 2022-03-31: qty 250

## 2022-03-31 MED ORDER — LIDOCAINE 2% (20 MG/ML) 5 ML SYRINGE
INTRAMUSCULAR | Status: DC | PRN
  Administered 2022-03-31: 100 mg via INTRAVENOUS

## 2022-03-31 MED ORDER — LACTATED RINGERS IV SOLN: INTRAVENOUS | Status: DC

## 2022-03-31 MED ORDER — FLUTICASONE PROPIONATE 50 MCG/ACT NA SUSP
2.0000 | Freq: Every day | NASAL | Status: DC
  Filled 2022-03-31: qty 16

## 2022-03-31 MED ORDER — IPRATROPIUM BROMIDE 0.06 % NA SOLN
2.0000 | Freq: Every day | NASAL | Status: DC
  Filled 2022-03-31: qty 15

## 2022-03-31 MED ORDER — KETOROLAC TROMETHAMINE 30 MG/ML IJ SOLN: 30.0000 mg | Freq: Once | INTRAMUSCULAR | Status: AC

## 2022-03-31 MED ORDER — ALBUTEROL SULFATE (2.5 MG/3ML) 0.083% IN NEBU: 2.5000 mg | INHALATION_SOLUTION | Freq: Four times a day (QID) | RESPIRATORY_TRACT | Status: DC | PRN

## 2022-03-31 MED ORDER — TRAMADOL HCL 50 MG PO TABS: 50.0000 mg | ORAL_TABLET | ORAL | Status: DC | PRN

## 2022-03-31 MED ORDER — CLOPIDOGREL BISULFATE 75 MG PO TABS
75.0000 mg | ORAL_TABLET | Freq: Every day | ORAL | Status: DC
  Administered 2022-04-01: 75 mg via ORAL
  Filled 2022-03-31: qty 1

## 2022-03-31 MED ORDER — IPRATROPIUM-ALBUTEROL 0.5-2.5 (3) MG/3ML IN SOLN: 3.0000 mL | Freq: Two times a day (BID) | RESPIRATORY_TRACT | Status: DC

## 2022-03-31 MED ORDER — DIVALPROEX SODIUM 500 MG PO DR TAB
500.0000 mg | DELAYED_RELEASE_TABLET | Freq: Two times a day (BID) | ORAL | Status: DC
  Administered 2022-04-01: 500 mg via ORAL
  Filled 2022-03-31 (×2): qty 1

## 2022-03-31 MED ORDER — NITROGLYCERIN 1 MG/10 ML FOR IR/CATH LAB
INTRA_ARTERIAL | Status: AC
  Filled 2022-03-31: qty 10

## 2022-03-31 MED ORDER — FENTANYL CITRATE (PF) 100 MCG/2ML IJ SOLN
INTRAMUSCULAR | Status: AC
  Administered 2022-03-31: 50 ug via INTRAVENOUS
  Filled 2022-03-31: qty 2

## 2022-03-31 MED ORDER — DEXAMETHASONE SODIUM PHOSPHATE 10 MG/ML IJ SOLN
INTRAMUSCULAR | Status: DC | PRN
  Administered 2022-03-31: 5 mg via INTRAVENOUS

## 2022-03-31 MED ORDER — DEXMEDETOMIDINE HCL IN NACL 80 MCG/20ML IV SOLN
INTRAVENOUS | Status: DC | PRN
  Administered 2022-03-31: 8 ug via BUCCAL

## 2022-03-31 MED ORDER — AMLODIPINE BESYLATE 10 MG PO TABS
10.0000 mg | ORAL_TABLET | Freq: Every morning | ORAL | Status: DC
  Administered 2022-04-01: 10 mg via ORAL
  Filled 2022-03-31: qty 1

## 2022-03-31 MED ORDER — CHLORHEXIDINE GLUCONATE 0.12 % MT SOLN
15.0000 mL | Freq: Once | OROMUCOSAL | Status: AC
  Administered 2022-03-31: 15 mL via OROMUCOSAL
  Filled 2022-03-31: qty 15

## 2022-03-31 MED ORDER — GABAPENTIN 300 MG PO CAPS
300.0000 mg | ORAL_CAPSULE | Freq: Every day | ORAL | Status: DC
  Administered 2022-03-31: 300 mg via ORAL
  Filled 2022-03-31: qty 1

## 2022-03-31 MED ORDER — IOHEXOL 300 MG/ML  SOLN
100.0000 mL | Freq: Once | INTRAMUSCULAR | Status: AC | PRN
  Administered 2022-03-31: 40 mL via INTRA_ARTERIAL

## 2022-03-31 MED ORDER — IPRATROPIUM-ALBUTEROL 0.5-2.5 (3) MG/3ML IN SOLN: 3.0000 mL | Freq: Four times a day (QID) | RESPIRATORY_TRACT | Status: DC | PRN

## 2022-03-31 MED ORDER — ASPIRIN 325 MG PO TBEC: 325.0000 mg | DELAYED_RELEASE_TABLET | ORAL | Status: DC

## 2022-03-31 MED ORDER — ASPIRIN 81 MG PO CHEW
81.0000 mg | CHEWABLE_TABLET | Freq: Every day | ORAL | Status: DC
  Administered 2022-04-01: 81 mg via ORAL
  Filled 2022-03-31: qty 1

## 2022-03-31 MED ORDER — CLOPIDOGREL BISULFATE 75 MG PO TABS: 75.0000 mg | ORAL_TABLET | ORAL | Status: DC

## 2022-03-31 MED ORDER — ORAL CARE MOUTH RINSE: 15.0000 mL | Freq: Once | OROMUCOSAL | Status: AC

## 2022-03-31 MED ORDER — LORATADINE 10 MG PO TABS: 10.0000 mg | ORAL_TABLET | Freq: Every evening | ORAL | Status: DC | PRN

## 2022-03-31 MED ORDER — NIMODIPINE 30 MG PO CAPS: 0.0000 mg | ORAL_CAPSULE | ORAL | Status: DC

## 2022-03-31 MED ORDER — INSULIN ASPART 100 UNIT/ML IJ SOLN: 0.0000 [IU] | INTRAMUSCULAR | Status: DC | PRN

## 2022-03-31 MED ORDER — IPRATROPIUM-ALBUTEROL 0.5-2.5 (3) MG/3ML IN SOLN: 3.0000 mL | Freq: Once | RESPIRATORY_TRACT | Status: AC

## 2022-03-31 MED ORDER — SODIUM CHLORIDE 0.9 % IV SOLN: INTRAVENOUS | Status: DC

## 2022-03-31 MED ORDER — DICYCLOMINE HCL 20 MG PO TABS: 20.0000 mg | ORAL_TABLET | Freq: Every day | ORAL | Status: DC | PRN

## 2022-03-31 MED ORDER — ACETAMINOPHEN 500 MG PO TABS
1000.0000 mg | ORAL_TABLET | Freq: Once | ORAL | Status: AC
  Administered 2022-03-31: 1000 mg via ORAL
  Filled 2022-03-31: qty 2

## 2022-03-31 MED ORDER — IOHEXOL 300 MG/ML  SOLN
100.0000 mL | Freq: Once | INTRAMUSCULAR | Status: AC | PRN
  Administered 2022-03-31: 28 mL via INTRA_ARTERIAL

## 2022-03-31 MED ORDER — CLEVIDIPINE BUTYRATE 0.5 MG/ML IV EMUL
INTRAVENOUS | Status: AC
  Filled 2022-03-31: qty 50

## 2022-03-31 MED ORDER — NITROGLYCERIN 0.4 MG SL SUBL: 0.4000 mg | SUBLINGUAL_TABLET | SUBLINGUAL | Status: DC | PRN

## 2022-03-31 MED ORDER — SUCCINYLCHOLINE CHLORIDE 200 MG/10ML IV SOSY
PREFILLED_SYRINGE | INTRAVENOUS | Status: DC | PRN
  Administered 2022-03-31: 100 mg via INTRAVENOUS

## 2022-03-31 MED ORDER — HEPARIN (PORCINE) 25000 UT/250ML-% IV SOLN
INTRAVENOUS | Status: AC
  Administered 2022-03-31: 500 [IU]/h via INTRAVENOUS
  Filled 2022-03-31: qty 250

## 2022-03-31 MED ORDER — LEVOCETIRIZINE DIHYDROCHLORIDE 5 MG PO TABS: 5.0000 mg | ORAL_TABLET | Freq: Every evening | ORAL | Status: DC | PRN

## 2022-03-31 MED ORDER — PROPOFOL 500 MG/50ML IV EMUL
INTRAVENOUS | Status: DC | PRN
  Administered 2022-03-31: 25 ug/kg/min via INTRAVENOUS

## 2022-03-31 MED ORDER — BUDESON-GLYCOPYRROL-FORMOTEROL 160-9-4.8 MCG/ACT IN AERO
2.0000 | INHALATION_SPRAY | Freq: Every day | RESPIRATORY_TRACT | Status: DC
  Filled 2022-03-31: qty 0.42

## 2022-03-31 MED ORDER — CHLORHEXIDINE GLUCONATE CLOTH 2 % EX PADS: 6.0000 | MEDICATED_PAD | Freq: Every day | CUTANEOUS | Status: DC

## 2022-03-31 MED ORDER — TOPIRAMATE 25 MG PO TABS
50.0000 mg | ORAL_TABLET | Freq: Every day | ORAL | Status: DC
  Administered 2022-03-31: 50 mg via ORAL
  Filled 2022-03-31: qty 2

## 2022-03-31 MED ORDER — FAMOTIDINE 20 MG PO TABS
40.0000 mg | ORAL_TABLET | Freq: Every day | ORAL | Status: DC
  Filled 2022-03-31: qty 2

## 2022-03-31 MED ORDER — ONDANSETRON HCL 4 MG/2ML IJ SOLN
INTRAMUSCULAR | Status: DC | PRN
  Administered 2022-03-31: 4 mg via INTRAVENOUS

## 2022-03-31 MED ORDER — SUGAMMADEX SODIUM 200 MG/2ML IV SOLN
INTRAVENOUS | Status: DC | PRN
  Administered 2022-03-31: 200 mg via INTRAVENOUS

## 2022-03-31 MED ORDER — ONDANSETRON HCL 4 MG/2ML IJ SOLN: 4.0000 mg | Freq: Once | INTRAMUSCULAR | Status: DC | PRN

## 2022-03-31 MED ORDER — CLEVIDIPINE BUTYRATE 0.5 MG/ML IV EMUL
0.0000 mg/h | INTRAVENOUS | Status: DC
  Administered 2022-03-31: 15 mg/h via INTRAVENOUS
  Filled 2022-03-31: qty 50

## 2022-03-31 MED ORDER — FENTANYL CITRATE (PF) 100 MCG/2ML IJ SOLN
25.0000 ug | INTRAMUSCULAR | Status: DC | PRN
  Administered 2022-03-31 (×2): 25 ug via INTRAVENOUS

## 2022-03-31 MED ORDER — SODIUM CHLORIDE 0.9 % IV SOLN: 250.0000 mL | INTRAVENOUS | Status: DC

## 2022-03-31 MED ORDER — VANCOMYCIN HCL 1000 MG IV SOLR: 1000.0000 mg | INTRAVENOUS | Status: DC

## 2022-03-31 MED ORDER — HEPARIN (PORCINE) 25000 UT/250ML-% IV SOLN
500.0000 [IU]/h | INTRAVENOUS | Status: DC
  Administered 2022-03-31: 500 [IU]/h via INTRAVENOUS

## 2022-03-31 MED ORDER — HEPARIN (PORCINE) 25000 UT/250ML-% IV SOLN
500.0000 [IU]/h | INTRAVENOUS | Status: DC
  Filled 2022-03-31: qty 250

## 2022-03-31 MED ORDER — EPTIFIBATIDE 20 MG/10ML IV SOLN
INTRAVENOUS | Status: AC
  Filled 2022-03-31: qty 10

## 2022-03-31 MED ORDER — IPRATROPIUM-ALBUTEROL 0.5-2.5 (3) MG/3ML IN SOLN
RESPIRATORY_TRACT | Status: AC
  Administered 2022-03-31: 3 mL via RESPIRATORY_TRACT
  Filled 2022-03-31: qty 3

## 2022-03-31 MED ORDER — VANCOMYCIN HCL IN DEXTROSE 1-5 GM/200ML-% IV SOLN
1000.0000 mg | INTRAVENOUS | Status: AC
  Administered 2022-03-31: 1000 mg via INTRAVENOUS
  Filled 2022-03-31: qty 200

## 2022-03-31 MED ORDER — METOPROLOL SUCCINATE ER 25 MG PO TB24: 25.0000 mg | ORAL_TABLET | Freq: Every day | ORAL | Status: DC

## 2022-03-31 MED ORDER — SODIUM CHLORIDE 0.9 % IV SOLN: 0.0000 ug/min | INTRAVENOUS | Status: DC

## 2022-03-31 MED ORDER — BACLOFEN 10 MG PO TABS
10.0000 mg | ORAL_TABLET | Freq: Two times a day (BID) | ORAL | Status: DC
  Administered 2022-03-31 – 2022-04-01 (×2): 10 mg via ORAL
  Filled 2022-03-31 (×2): qty 1

## 2022-03-31 MED ORDER — ATORVASTATIN CALCIUM 80 MG PO TABS: 80.0000 mg | ORAL_TABLET | Freq: Every day | ORAL | Status: DC

## 2022-03-31 MED ORDER — HEPARIN SODIUM (PORCINE) 1000 UNIT/ML IJ SOLN
INTRAMUSCULAR | Status: DC | PRN
  Administered 2022-03-31: 3000 [IU] via INTRAVENOUS
  Administered 2022-03-31 (×2): 1000 [IU] via INTRAVENOUS

## 2022-03-31 MED ORDER — ORAL CARE MOUTH RINSE: 15.0000 mL | OROMUCOSAL | Status: DC | PRN

## 2022-03-31 MED ORDER — CLOPIDOGREL BISULFATE 75 MG PO TABS: 75.0000 mg | ORAL_TABLET | Freq: Every day | ORAL | Status: DC

## 2022-03-31 MED ORDER — PHENYLEPHRINE 80 MCG/ML (10ML) SYRINGE FOR IV PUSH (FOR BLOOD PRESSURE SUPPORT)
PREFILLED_SYRINGE | INTRAVENOUS | Status: DC | PRN
  Administered 2022-03-31: 80 ug via INTRAVENOUS
  Administered 2022-03-31: 40 ug via INTRAVENOUS

## 2022-03-31 MED ORDER — FENTANYL CITRATE (PF) 250 MCG/5ML IJ SOLN
INTRAMUSCULAR | Status: DC | PRN
  Administered 2022-03-31: 50 ug via INTRAVENOUS

## 2022-03-31 MED ORDER — PROPOFOL 10 MG/ML IV BOLUS
INTRAVENOUS | Status: DC | PRN
  Administered 2022-03-31: 150 mg via INTRAVENOUS
  Administered 2022-03-31: 40 mg via INTRAVENOUS

## 2022-03-31 NOTE — Sedation Documentation (Signed)
Right femoral site assessed with PACU, RN. Groin site (clean, dry, intact), distal pulses palpable.

## 2022-03-31 NOTE — Procedures (Addendum)
INR. Status post right common carotid arteriogram. Right CFA approach. Findings. 2 small lobulated aneurysms the largest measuring about approximate 3.47m x 2.3 mm involving the right MCA trifurcation superior division, and the smaller involving the origin of the inferior branch of the right MCA.. Treatment. Endovascular embolization of right middle cerebral artery trifurcation aneurysms using a FRED X21 flow diverter, 3 x 13 x 9 mm with flow stasis. Post CT brain.  No evidence of intracranial hemorrhage. Manual compression with quick clot applied for hemostasis at the right groin puncture site.  Distal pulses all intact bilaterally .unchanged. Extubated. Following simple commands appropriately. Denies any headaches , nausea or vomiting. Pupils 2 mm bilaterally sluggish.  No gross facial asymmetry.  Tongue midline. Moves all fours when asked equally bilaterally. SArlean HoppingMD.

## 2022-03-31 NOTE — Anesthesia Procedure Notes (Signed)
Procedure Name: Intubation Date/Time: 03/31/2022 11:56 AM  Performed by: Lorie Phenix, CRNAPre-anesthesia Checklist: Patient identified, Emergency Drugs available, Suction available and Patient being monitored Patient Re-evaluated:Patient Re-evaluated prior to induction Oxygen Delivery Method: Circle System Utilized Preoxygenation: Pre-oxygenation with 100% oxygen Induction Type: IV induction, Rapid sequence and Cricoid Pressure applied Ventilation: Mask ventilation without difficulty Laryngoscope Size: Glidescope and 3 Grade View: Grade I Tube type: Oral Tube size: 7.0 mm Number of attempts: 1 Airway Equipment and Method: Stylet and Video-laryngoscopy Placement Confirmation: ETT inserted through vocal cords under direct vision, positive ETCO2 and breath sounds checked- equal and bilateral Secured at: 22 cm Tube secured with: Tape Dental Injury: Teeth and Oropharynx as per pre-operative assessment  Difficulty Due To: Difficulty was anticipated and Difficult Airway- due to anterior larynx

## 2022-03-31 NOTE — Progress Notes (Signed)
Patient arrived to short stay with her home medications.  All home medications were counted by 2 RNs and walked down to main pharmacy.  Patient's medications are currently being stored in the main pharmacy vault.  Receipts for Patient's Home Medications Stored by Pharmacy placed in patient's shadow chart.

## 2022-03-31 NOTE — Sedation Documentation (Signed)
Quick clot and pressure dressing applied to right groin. Dressing clean, dry, intact. Distal pulses palpable.

## 2022-03-31 NOTE — Sedation Documentation (Signed)
Right femoral sheath removed. Manual pressure being held at groin site by IR tech.

## 2022-03-31 NOTE — Anesthesia Procedure Notes (Signed)
Arterial Line Insertion Start/End9/18/2023 8:05 AM, 03/31/2022 8:10 AM Performed by: Santa Lighter, MD, anesthesiologist  Patient location: Pre-op. Preanesthetic checklist: patient identified, IV checked, site marked, risks and benefits discussed, surgical consent, monitors and equipment checked, pre-op evaluation, timeout performed and anesthesia consent Lidocaine 1% used for infiltration Right, radial was placed Catheter size: 20 G Hand hygiene performed  and maximum sterile barriers used   Attempts: 1 Procedure performed using ultrasound guided technique. Ultrasound Notes:anatomy identified, needle tip was noted to be adjacent to the nerve/plexus identified, no ultrasound evidence of intravascular and/or intraneural injection and image(s) printed for medical record Following insertion, dressing applied and Biopatch. Post procedure assessment: normal and unchanged  Post procedure complications: second provider assisted. Patient tolerated the procedure well with no immediate complications.

## 2022-03-31 NOTE — Sedation Documentation (Signed)
Right knee immobilizer applied to right leg.

## 2022-03-31 NOTE — Anesthesia Postprocedure Evaluation (Signed)
Anesthesia Post Note  Patient: Shawna Anderson  Procedure(s) Performed: ANEURYSM EMBOLIZATION     Patient location during evaluation: PACU Anesthesia Type: General Level of consciousness: awake and alert, oriented and patient cooperative Pain management: pain level controlled Vital Signs Assessment: post-procedure vital signs reviewed and stable Respiratory status: spontaneous breathing, nonlabored ventilation and respiratory function stable Cardiovascular status: blood pressure returned to baseline and stable Postop Assessment: no apparent nausea or vomiting Anesthetic complications: yes   Encounter Notable Events  Notable Event Outcome Phase Comment  Difficult to intubate - expected  Intraprocedure Filed from anesthesia note documentation.    Last Vitals:  Vitals:   03/31/22 1505 03/31/22 1535  BP:    Pulse:  64  Resp:  13  Temp: 37 C   SpO2:  93%    Last Pain:  Vitals:   03/31/22 1535  TempSrc:   PainSc: 8       LLE Sensation: Full sensation (03/31/22 1535)   RLE Sensation: Full sensation (03/31/22 1535)      [place]

## 2022-03-31 NOTE — Progress Notes (Addendum)
Referring Physician(s):  McLean-Scocuzza, Olivia Mackie    Supervising Physician: Luanne Bras  Patient Status:  [facility] - In-pt  Chief Complaint:  S/p cerebral angiogram with intervention for R MCA aneurysm   Subjective:  Pt resting in bed. She reports right shoulder pain that is chronic. Pt denies HA, SOB, N/V.   Allergies: Ativan [lorazepam], Haldol [haloperidol lactate], Augmentin [amoxicillin-pot clavulanate], Lactose intolerance (gi), Latex, Tape, Xifaxan [rifaximin], Drixoral allergy sinus [dexbromphen-pse-apap er], and Tilactase  Medications: Prior to Admission medications   Medication Sig Start Date End Date Taking? Authorizing Provider  albuterol (PROAIR HFA) 108 (90 Base) MCG/ACT inhaler Inhale 1-2 puffs into the lungs every 4 (four) hours as needed for wheezing or shortness of breath. Patient taking differently: Inhale 1-2 puffs into the lungs 2 (two) times daily. 11/28/21   McLean-Scocuzza, Nino Glow, MD  albuterol (PROVENTIL) (2.5 MG/3ML) 0.083% nebulizer solution Take 2.5 mg by nebulization every 6 (six) hours as needed for wheezing or shortness of breath.    [provider]  amLODipine (NORVASC) 10 MG tablet Take 1 tablet (10 mg total) by mouth at bedtime. Patient taking differently: Take 10 mg by mouth every morning. 02/17/22   Furth, Cadence H, PA-C  aspirin 81 MG chewable tablet Chew 81 mg by mouth daily.    [provider]  atorvastatin (LIPITOR) 80 MG tablet Take 1 tablet (80 mg total) by mouth daily. Patient taking differently: Take 80 mg by mouth at bedtime. 02/17/22   Furth, Cadence H, PA-C  baclofen (LIORESAL) 10 MG tablet Take 10 mg by mouth 2 (two) times daily. 11/22/21 05/21/22  [provider]  Budeson-Glycopyrrol-Formoterol (BREZTRI AEROSPHERE) 160-9-4.8 MCG/ACT AERO Inhale 2 puffs into the lungs in the morning and at bedtime. Rinse 06/13/21   Tyler Pita, MD  clopidogrel (PLAVIX) 75 MG tablet Take 1 tablet (75 mg total) by mouth  daily. 02/17/22   Furth, Cadence H, PA-C  dicyclomine (BENTYL) 20 MG tablet Take 1 tablet (20 mg total) by mouth 4 (four) times daily -  before meals and at bedtime. Patient taking differently: Take 20 mg by mouth daily as needed (Vomiting). 09/12/21   McLean-Scocuzza, Nino Glow, MD  divalproex (DEPAKOTE) 500 MG DR tablet TAKE 1 TABLET BY MOUTH  TWICE DAILY Patient taking differently: Take 500 mg by mouth 2 (two) times daily. 08/12/21   McLean-Scocuzza, Nino Glow, MD  esomeprazole (NEXIUM) 40 MG capsule Take 40 mg by mouth in the morning and at bedtime.    [provider]  Evolocumab (REPATHA SURECLICK) 196 MG/ML SOAJ INJECT '140MG'$  SUBCUTANEOUSLY  EVERY 2 WEEKS Patient taking differently: Inject 140 mg into the skin every 14 (fourteen) days. 02/17/22   Furth, Cadence H, PA-C  ezetimibe (ZETIA) 10 MG tablet TAKE 1 TABLET BY MOUTH  DAILY WITH LIPITOR 80 MG AT NIGHT Patient taking differently: Take 10 mg by mouth at bedtime. 02/17/22   Furth, Cadence H, PA-C  famotidine (PEPCID) 40 MG tablet Take 40 mg by mouth daily.    [provider]  fluticasone (FLONASE) 50 MCG/ACT nasal spray Place 2 sprays into both nostrils daily as needed for allergies or rhinitis. Patient taking differently: Place 2 sprays into both nostrils daily. 12/27/21   McLean-Scocuzza, Nino Glow, MD  gabapentin (NEURONTIN) 300 MG capsule Take 300 mg by mouth at bedtime.    [provider]  hydrochlorothiazide (MICROZIDE) 12.5 MG capsule Take 1 capsule (12.5 mg total) by mouth daily. 03/05/22 06/03/22  Furth, Cadence H, PA-C  ipratropium (ATROVENT) 0.06 %  nasal spray USE 2 SPRAYS IN BOTH  NOSTRILS 3 TIMES DAILY Patient taking differently: Place 2 sprays into both nostrils daily. 11/25/21   McLean-Scocuzza, Nino Glow, MD  Ipratropium-Albuterol (COMBIVENT RESPIMAT) 20-100 MCG/ACT AERS respimat Inhale 1 puff into the lungs 2 (two) times daily.    [provider]  ipratropium-albuterol (DUONEB) 0.5-2.5 (3) MG/3ML SOLN Take 3  mLs by nebulization every 6 (six) hours as needed. Patient taking differently: Take 3 mLs by nebulization every 6 (six) hours as needed (Shortness of breath & bronchitis). 11/28/21   McLean-Scocuzza, Nino Glow, MD  isosorbide mononitrate (IMDUR) 60 MG 24 hr tablet Take 1 tablet (60 mg total) by mouth daily. Patient taking differently: Take 90 mg by mouth daily. 02/17/22   Furth, Cadence H, PA-C  levocetirizine (XYZAL) 5 MG tablet Take 5 mg by mouth at bedtime as needed for allergies. 10/25/20   [provider]  metoCLOPramide (REGLAN) 5 MG tablet TAKE 1 TABLET BY MOUTH 4  TIMES DAILY BEFORE MEALS  AND AT BEDTIME Patient taking differently: Take 5 mg by mouth 4 (four) times daily -  before meals and at bedtime. 06/10/21   Lin Landsman, MD  metoprolol succinate (TOPROL-XL) 25 MG 24 hr tablet Take 25 mg by mouth at bedtime. 03/11/22   [provider]  naloxone [facility]) nasal spray 4 mg/0.1 mL Place 4 mg into the nose as needed for opioid reversal. 01/02/22   [provider]  nitroGLYCERIN (NITROSTAT) 0.4 MG SL tablet DISSOLVE 1 TABLET UNDER  TONGUE EVERY 5 MINUTES AS  NEEDED FOR CHEST PAIN MAX  OF 3 TABLETS IN 15 MINUTES  . CALL 911 AFTER THIRD DOSE Patient taking differently: Place 0.4 mg under the tongue every 5 (five) minutes x 3 doses as needed for chest pain. 10/16/21   Loel Dubonnet, NP  ondansetron (ZOFRAN-ODT) 4 MG disintegrating tablet DISSOLVE 1 TABLET ON TOP OF THE  TONGUE EVERY 8 HOURS AS NEEDED  FOR NAUEA OR VOMITING Patient taking differently: Take 4 mg by mouth every 8 (eight) hours as needed for nausea or vomiting. 11/21/21   McLean-Scocuzza, Nino Glow, MD  potassium chloride (MICRO-K) 10 MEQ CR capsule Take 2 capsules (20 mEq total) by mouth daily. 02/17/22   Furth, Cadence H, PA-C  promethazine (PHENERGAN) 25 MG tablet TAKE 1 TABLET BY MOUTH TWICE  DAILY AS NEEDED FOR NAUSEA OR  VOMITING Patient taking differently: Take 25 mg by mouth 2 (two) times daily as needed  for nausea or vomiting. 11/26/21   McLean-Scocuzza, Nino Glow, MD  sodium chloride (OCEAN) 0.65 % SOLN nasal spray Place 2 sprays into both nostrils daily as needed for congestion. Patient not taking: Reported on 03/31/2022 12/27/21   McLean-Scocuzza, Nino Glow, MD  topiramate (TOPAMAX) 50 MG tablet Take 50 mg by mouth at bedtime.    [provider]  traMADol (ULTRAM) 50 MG tablet Take 50 mg by mouth every 4 (four) hours as needed (pain). 02/06/22 05/07/22  [provider]     Vital Signs: There were no vitals taken for this visit.  Physical Exam Vitals reviewed.  Constitutional:      General: She is not in acute distress.    Appearance: She is not ill-appearing.  HENT:     Head: Normocephalic and atraumatic.  Eyes:     Extraocular Movements: Extraocular movements intact.     Pupils: Pupils are equal, round, and reactive to light.  Cardiovascular:     Rate and Rhythm: Normal rate and regular  rhythm.  Pulmonary:     Effort: Pulmonary effort is normal. No respiratory distress.     Breath sounds: Normal breath sounds.  Skin:    General: Skin is warm and dry.     Coloration: Skin is pale.     Comments: R CFA access site is soft with no active bleeding and no appreciable pseudoaneurysm. Dressing is C/D/I     Neurological:     General: No focal deficit present.     Mental Status: She is alert and oriented to person, place, and time.     Comments: Alert, aware and oriented X 3 Speech and comprehension is intact.  PERRL bilaterally No facial droop noted Tongue midline Can spontaneously move all 4 extremities.  Hand grip strength equal bilaterally. Fine motor and coordination intact.  Distal pulses (DP's) palpable bilaterally   Negative pronator drift.      Psychiatric:        Mood and Affect: Mood normal.        Behavior: Behavior normal.        Thought Content: Thought content normal.        Judgment: Judgment normal.     Imaging: No results  found.  Labs:  CBC: Recent Labs    12/25/21 0641 12/26/21 0500 01/20/22 0745 03/31/22 0703  WBC 8.1 8.2 14.8* 12.4*  HGB 11.7* 10.4* 10.6* 11.2*  HCT 36.1 32.3* 32.4* 33.9*  PLT 319 301 342 370    COAGS: Recent Labs    11/25/21 0708 12/25/21 0641 01/20/22 0711 03/31/22 0703  INR 0.9 0.9 1.0 0.9    BMP: Recent Labs    12/25/21 0641 12/26/21 0500 01/20/22 0711 03/31/22 0703  NA 140 141 139 142  K 4.2 3.4* 4.1 3.5  CL 108 108 107 108  CO2 24 24 21* 27  GLUCOSE 115* 124* 116* 112*  BUN 20 11 28* 28*  CALCIUM 8.6* 7.9* 9.1 8.7*  CREATININE 0.79 0.78 0.85 0.74  GFRNONAA >60 >60 >60 >60    LIVER FUNCTION TESTS: Recent Labs    04/22/21 1047 04/24/21 0711 08/21/21 1002  BILITOT 0.7 0.5 0.2  AST 27 19 41*  ALT 81* 43 58*  ALKPHOS 86 61 116  PROT 7.1 6.2* 6.6  ALBUMIN 4.1 3.5 4.6    Assessment and Plan:  Alert, aware and oriented X 3 Speech and comprehension is intact.  PERRL bilaterally No facial droop noted Tongue midline Can spontaneously move all 4 extremities.  Heparin drip infusing.   Pt to be admitted to the Neuro ICU for overnight observation.  Ok to advance diet as tolerated starting with ice chips.    NIR to follow-Contact NIR with questions or concerns.   Electronically Signed: Tyson Alias, NP 03/31/2022, 4:15 PM   I spent a total of 15 Minutes at the the patient's bedside AND on the patient's hospital floor or unit, greater than 50% of which was counseling/coordinating care for R MCA aneurysm.

## 2022-03-31 NOTE — Progress Notes (Signed)
Orthopedic Tech Progress Note Patient Details:  Shawna Anderson 1966/01/20 [id number]  IR RN called requesting a knee immobilizer, handed it off to RN before patient went to PACU RECOVERY  Ortho Devices Type of Ortho Device: Knee Immobilizer Ortho Device/Splint Location: RLE Ortho Device/Splint Interventions: Ordered, Other (comment)   Post Interventions Patient Tolerated: Other (comment) Instructions Provided: Other (comment)  Janit Pagan 03/31/2022, 3:18 PM

## 2022-03-31 NOTE — Progress Notes (Signed)
ANTICOAGULATION CONSULT NOTE - Initial Consult  Pharmacy Consult for heparin Indication:  Post interventional radiology procedure  Allergies  Allergen Reactions   Ativan [Lorazepam] Other (See Comments)    Pt states it makes her tongue do weird things     Haldol [Haloperidol Lactate] Other (See Comments)    Pt states it makes her tongue do weird things     Augmentin [Amoxicillin-Pot Clavulanate]     Upset stomach    Lactose Intolerance (Gi)     Gi upset   Latex Other (See Comments)    Patient stated that she was told by her doctor that she is "allergic to" this   Tape Other (See Comments)    Patient stated that she was told by her doctor that she is "allergic to" this   Xifaxan [Rifaximin] Other (See Comments)    Pt believes this is contributing to her abdominal pain/discomfort   Drixoral Allergy Sinus [Dexbromphen-Pse-Apap Er] Rash   Tilactase Diarrhea    Patient Measurements: Height: '5\' 3"'$  (160 cm) Weight: 88.2 kg (194 lb 7.1 oz) IBW/kg (Calculated) : 52.4 Heparin Dosing Weight: 72kg  Vital Signs: Temp: 97.6 F (36.4 C) (09/18 1600) BP: 113/66 (09/18 1900) Pulse Rate: 76 (09/18 1902)  Labs: Recent Labs    03/31/22 0703  HGB 11.2*  HCT 33.9*  PLT 370  LABPROT 12.3  INR 0.9  CREATININE 0.74    Estimated Creatinine Clearance: 84.6 mL/min (by C-G formula based on SCr of 0.74 mg/dL).   Medical History: Past Medical History:  Diagnosis Date   Anxiety    Arthritis    Bilateral shoulders   Asthma    Bipolar 1 disorder (HCC)    Bipolar disorder ([place])    CAD (coronary artery disease)    s/p stent BMS OM Cx   Cervical herniated disc 04/12/2016   COPD (chronic obstructive pulmonary disease) (HCC)    Depression    Diabetes mellitus with gastroparesis (HCC)    Diabetes mellitus without complication (HCC)    no meds, patient states Borderline"   Diverticulitis    GERD (gastroesophageal reflux disease)    Glaucoma    Hearing loss    bilateral   History  of blood transfusion    Hyperlipidemia    Hypertension    Lower back pain    OSA (obstructive sleep apnea)    not using cpap    Plantar fasciitis    bilateral foot pain, b/l feet s/p steroid shots w/o help and left surgery w/o help   PONV (postoperative nausea and vomiting)    Spinal headache    UTI (urinary tract infection)     Assessment: 56 year old female s/p right common carotid arteriogram and stenting. Patient to continue on IV heparin overnight. INR normal, cbc appear normal. No bleeding issues noted post procedure, heparin started in PACU.   Goal of Therapy:  Heparin level 0.1-0.25 units/ml Monitor platelets by anticoagulation protocol: Yes   Plan:  Start heparin infusion at 500 units/hr Check anti-Xa level in 6 hours and daily while on heparin Continue to monitor H&H and platelets Heparin off at 8am 9/19  Erin Hearing PharmD., BCPS Clinical Pharmacist 03/31/2022 7:37 PM

## 2022-03-31 NOTE — Transfer of Care (Signed)
Immediate Anesthesia Transfer of Care Note  Patient: Shawna Anderson  Procedure(s) Performed: ANEURYSM EMBOLIZATION  Patient Location: PACU  Anesthesia Type:General  Level of Consciousness: awake and alert   Airway & Oxygen Therapy: Patient Spontanous Breathing and Patient connected to face mask oxygen  Post-op Assessment: Report given to RN and Post -op Vital signs reviewed and stable  Post vital signs: Reviewed and stable  Last Vitals:  Vitals Value Taken Time  BP 112/69 03/31/22 1503  Temp    Pulse 59 03/31/22 1513  Resp 18 03/31/22 1513  SpO2 98 % 03/31/22 1513  Vitals shown include unvalidated device data.  Last Pain:  Vitals:   03/31/22 0725  TempSrc:   PainSc: 7          Complications:  Encounter Notable Events  Notable Event Outcome Phase Comment  Difficult to intubate - expected  Intraprocedure Filed from anesthesia note documentation.

## 2022-04-01 ENCOUNTER — Encounter (HOSPITAL_COMMUNITY): Payer: Self-pay | Admitting: Interventional Radiology

## 2022-04-01 LAB — CBC WITH DIFFERENTIAL/PLATELET
Abs Immature Granulocytes: 0.08 10*3/uL — ABNORMAL HIGH (ref 0.00–0.07)
Basophils Absolute: 0 10*3/uL (ref 0.0–0.1)
Basophils Relative: 0 %
Eosinophils Absolute: 0 10*3/uL (ref 0.0–0.5)
Eosinophils Relative: 0 %
HCT: 32.8 % — ABNORMAL LOW (ref 36.0–46.0)
Hemoglobin: 10.7 g/dL — ABNORMAL LOW (ref 12.0–15.0)
Immature Granulocytes: 1 %
Lymphocytes Relative: 12 %
Lymphs Abs: 1.7 10*3/uL (ref 0.7–4.0)
MCH: 29.1 pg (ref 26.0–34.0)
MCHC: 32.6 g/dL (ref 30.0–36.0)
MCV: 89.1 fL (ref 80.0–100.0)
Monocytes Absolute: 0.9 10*3/uL (ref 0.1–1.0)
Monocytes Relative: 6 %
Neutro Abs: 11.2 10*3/uL — ABNORMAL HIGH (ref 1.7–7.7)
Neutrophils Relative %: 81 %
Platelets: 341 10*3/uL (ref 150–400)
RBC: 3.68 MIL/uL — ABNORMAL LOW (ref 3.87–5.11)
RDW: 15 % (ref 11.5–15.5)
WBC: 13.9 10*3/uL — ABNORMAL HIGH (ref 4.0–10.5)
nRBC: 0 % (ref 0.0–0.2)

## 2022-04-01 LAB — BASIC METABOLIC PANEL
Anion gap: 8 (ref 5–15)
BUN: 21 mg/dL — ABNORMAL HIGH (ref 6–20)
CO2: 21 mmol/L — ABNORMAL LOW (ref 22–32)
Calcium: 8.1 mg/dL — ABNORMAL LOW (ref 8.9–10.3)
Chloride: 112 mmol/L — ABNORMAL HIGH (ref 98–111)
Creatinine, Ser: 0.71 mg/dL (ref 0.44–1.00)
GFR, Estimated: >60 mL/min (ref >60)
Glucose, Bld: 192 mg/dL — ABNORMAL HIGH (ref 70–99)
Potassium: 3.8 mmol/L (ref 3.5–5.1)
Sodium: 141 mmol/L (ref 135–145)

## 2022-04-01 LAB — POCT ACTIVATED CLOTTING TIME
Activated Clotting Time: 173 seconds
Activated Clotting Time: 0 seconds
Activated Clotting Time: 0 seconds
Activated Clotting Time: 185 seconds
Activated Clotting Time: 209 seconds

## 2022-04-01 MED ORDER — HEPARIN (PORCINE) 25000 UT/250ML-% IV SOLN: 600.0000 [IU]/h | INTRAVENOUS | Status: DC

## 2022-04-01 NOTE — Discharge Summary (Signed)
Patient ID: Shawna Anderson MRN: [id number] DOB/AGE: 1965-12-14 56 y.o.  Admit date: 03/31/2022 Discharge date: 04/01/2022  Supervising Physician: Shawna Anderson  Patient Status: [facility] - In-pt  Admission Diagnoses: Right MCA Aneurysm   Discharge Diagnoses:  Principal Problem:   Status post coil embolization of cerebral aneurysm Active Problems:   Brain aneurysm   Discharged Condition: good  Hospital Course: Shawna Anderson, 56 year old female, has a medical history significant for CAD, HTN, OSA, substance abuse and intracranial aneurysms. In early 2023 she developed headaches and imaging showed a right PCA and right MCA intracranial aneurysms. She is now s/p coil embolization of the right PCA aneurysm 12/25/21. She returned to [facility] 01/20/22 for treatment of the right MCA but the procedure was cancelled due to leukocytosis.   She again presented to the [facility] 03/31/22 for treatment of the right MCA aneurysm and this was successfully treated by Dr. Estanislado Anderson. She was admitted to the Neuro ICU post-procedure for overnight observation. She had an uneventful recovery period and denies any pain or discomfort this morning. She states she is ready for discharge home.   The patient will follow up with Dr. Estanislado Anderson in approximately two weeks. A scheduler from our office will call her with a date/time of her appointment. She will resume her prior anticoagulation medications. She was instructed to refrain from bending, stooping, driving or lifting greater than 10 lb for two weeks.    Consults: None  Significant Diagnostic Studies: No results found.  Treatments: Observation   Discharge Exam: Blood pressure (!) 142/84, pulse 73, temperature 98.2 F (36.8 C), temperature source Oral, resp. rate 18, height '5\' 3"'  (1.6 m), weight 194 lb 7.1 oz (88.2 kg), SpO2 96 %. Physical Exam Constitutional:      General: She is not in acute distress.    Appearance: She is not ill-appearing.   Cardiovascular:     Rate and Rhythm: Normal rate and regular rhythm.     Comments: Right groin vascular site is clean, soft, dry and non-tender Pulmonary:     Effort: Pulmonary effort is normal.  Skin:    General: Skin is warm and dry.  Neurological:     Mental Status: She is alert and oriented to person, place, and time.     Cranial Nerves: No dysarthria or facial asymmetry.     Motor: No weakness.     Disposition: Discharge disposition: 01-Home or Self Care   Allergies as of 04/01/2022       Reactions   Ativan [lorazepam] Other (See Comments)   Pt states it makes her tongue do weird things    Haldol [haloperidol Lactate] Other (See Comments)   Pt states it makes her tongue do weird things    Augmentin [amoxicillin-pot Clavulanate]    Upset stomach   Lactose Intolerance (gi)    Gi upset   Latex Other (See Comments)   Patient stated that she was told by her doctor that she is "allergic to" this   Tape Other (See Comments)   Patient stated that she was told by her doctor that she is "allergic to" this   Xifaxan [rifaximin] Other (See Comments)   Pt believes this is contributing to her abdominal pain/discomfort   Drixoral Allergy Sinus [dexbromphen-pse-apap Er] Rash   Tilactase Diarrhea        Medication List     TAKE these medications    albuterol (2.5 MG/3ML) 0.083% nebulizer solution Commonly known as: PROVENTIL Take 2.5 mg by nebulization every  6 (six) hours as needed for wheezing or shortness of breath. What changed: Another medication with the same name was changed. Make sure you understand how and when to take each.   albuterol 108 (90 Base) MCG/ACT inhaler Commonly known as: ProAir HFA Inhale 1-2 puffs into the lungs every 4 (four) hours as needed for wheezing or shortness of breath. What changed: when to take this   amLODipine 10 MG tablet Commonly known as: NORVASC Take 1 tablet (10 mg total) by mouth at bedtime. What changed: when to take this    aspirin 81 MG chewable tablet Chew 81 mg by mouth daily.   atorvastatin 80 MG tablet Commonly known as: LIPITOR Take 1 tablet (80 mg total) by mouth daily. What changed: when to take this   baclofen 10 MG tablet Commonly known as: LIORESAL Take 10 mg by mouth 2 (two) times daily.   Breztri Aerosphere 160-9-4.8 MCG/ACT Aero Generic drug: Budeson-Glycopyrrol-Formoterol Inhale 2 puffs into the lungs in the morning and at bedtime. Rinse   clopidogrel 75 MG tablet Commonly known as: PLAVIX Take 1 tablet (75 mg total) by mouth daily.   dicyclomine 20 MG tablet Commonly known as: BENTYL Take 1 tablet (20 mg total) by mouth 4 (four) times daily -  before meals and at bedtime. What changed:  when to take this reasons to take this   divalproex 500 MG DR tablet Commonly known as: DEPAKOTE TAKE 1 TABLET BY MOUTH  TWICE DAILY   esomeprazole 40 MG capsule Commonly known as: NEXIUM Take 40 mg by mouth in the morning and at bedtime.   ezetimibe 10 MG tablet Commonly known as: ZETIA TAKE 1 TABLET BY MOUTH  DAILY WITH LIPITOR 80 MG AT NIGHT What changed:  how much to take how to take this when to take this additional instructions   famotidine 40 MG tablet Commonly known as: PEPCID Take 40 mg by mouth daily.   fluticasone 50 MCG/ACT nasal spray Commonly known as: FLONASE Place 2 sprays into both nostrils daily as needed for allergies or rhinitis. What changed: when to take this   gabapentin 300 MG capsule Commonly known as: NEURONTIN Take 300 mg by mouth at bedtime.   hydrochlorothiazide 12.5 MG capsule Commonly known as: MICROZIDE Take 1 capsule (12.5 mg total) by mouth daily.   ipratropium 0.06 % nasal spray Commonly known as: ATROVENT USE 2 SPRAYS IN BOTH  NOSTRILS 3 TIMES DAILY What changed:  how much to take how to take this when to take this additional instructions   Combivent Respimat 20-100 MCG/ACT Aers respimat Generic drug: Ipratropium-Albuterol Inhale  1 puff into the lungs 2 (two) times daily. What changed: Another medication with the same name was changed. Make sure you understand how and when to take each.   ipratropium-albuterol 0.5-2.5 (3) MG/3ML Soln Commonly known as: DUONEB Take 3 mLs by nebulization every 6 (six) hours as needed. What changed: reasons to take this   isosorbide mononitrate 60 MG 24 hr tablet Commonly known as: IMDUR Take 1 tablet (60 mg total) by mouth daily. What changed: how much to take   levocetirizine 5 MG tablet Commonly known as: XYZAL Take 5 mg by mouth at bedtime as needed for allergies.   metoCLOPramide 5 MG tablet Commonly known as: REGLAN TAKE 1 TABLET BY MOUTH 4  TIMES DAILY BEFORE MEALS  AND AT BEDTIME What changed: See the new instructions.   metoprolol succinate 25 MG 24 hr tablet Commonly known as: TOPROL-XL Take 25 mg  by mouth at bedtime.   naloxone 4 MG/0.1ML Liqd nasal spray kit Commonly known as: NARCAN Place 4 mg into the nose as needed for opioid reversal.   nitroGLYCERIN 0.4 MG SL tablet Commonly known as: NITROSTAT DISSOLVE 1 TABLET UNDER  TONGUE EVERY 5 MINUTES AS  NEEDED FOR CHEST PAIN MAX  OF 3 TABLETS IN 15 MINUTES  . CALL 911 AFTER THIRD DOSE What changed: See the new instructions.   ondansetron 4 MG disintegrating tablet Commonly known as: ZOFRAN-ODT DISSOLVE 1 TABLET ON TOP OF THE  TONGUE EVERY 8 HOURS AS NEEDED  FOR NAUEA OR VOMITING What changed: See the new instructions.   potassium chloride 10 MEQ CR capsule Commonly known as: MICRO-K Take 2 capsules (20 mEq total) by mouth daily.   promethazine 25 MG tablet Commonly known as: PHENERGAN TAKE 1 TABLET BY MOUTH TWICE  DAILY AS NEEDED FOR NAUSEA OR  VOMITING What changed:  how much to take how to take this when to take this reasons to take this additional instructions   Repatha SureClick 516 MG/ML Soaj Generic drug: Evolocumab INJECT 140MG SUBCUTANEOUSLY  EVERY 2 WEEKS What changed:  how much to  take how to take this when to take this additional instructions   sodium chloride 0.65 % Soln nasal spray Commonly known as: OCEAN Place 2 sprays into both nostrils daily as needed for congestion.   topiramate 50 MG tablet Commonly known as: TOPAMAX Take 50 mg by mouth at bedtime.   traMADol 50 MG tablet Commonly known as: ULTRAM Take 50 mg by mouth every 4 (four) hours as needed (pain).        Follow-up Information     [facility] Follow up.   Why: Please follow up with Dr. Estanislado Anderson in two weeks. A scheduler from our office will call you with a date/time of your appointment. Please call our office with any questions/concerns prior to your visit. Contact information: [place] [address] [phone]                  Electronically Signed: Theresa Duty, NP 04/01/2022, 9:05 AM   I have spent Less Than 30 Minutes discharging Shawna Anderson.

## 2022-04-01 NOTE — Plan of Care (Signed)

## 2022-04-01 NOTE — Progress Notes (Signed)
Patient initially told this RN that she only wanted her home medications to be dispensed to her during her hospital stay and that she did not want to be charged for hospital medications. Pharmacist Pilar Plate spoke with patient and informed her of [facility] policy regarding home medications. Patient refused 2000 scheduled medications. Patient later apologized to nursing staff and agreed to take some scheduled medications.   Iver Nestle, RN

## 2022-04-01 NOTE — Progress Notes (Signed)
ANTICOAGULATION CONSULT NOTE - Follow Up Consult  Pharmacy Consult for heparin Indication:  Post interventional radiology procedure  Allergies  Allergen Reactions   Ativan [Lorazepam] Other (See Comments)    Pt states it makes her tongue do weird things     Haldol [Haloperidol Lactate] Other (See Comments)    Pt states it makes her tongue do weird things     Augmentin [Amoxicillin-Pot Clavulanate]     Upset stomach    Lactose Intolerance (Gi)     Gi upset   Latex Other (See Comments)    Patient stated that she was told by her doctor that she is "allergic to" this   Tape Other (See Comments)    Patient stated that she was told by her doctor that she is "allergic to" this   Xifaxan [Rifaximin] Other (See Comments)    Pt believes this is contributing to her abdominal pain/discomfort   Drixoral Allergy Sinus [Dexbromphen-Pse-Apap Er] Rash   Tilactase Diarrhea    Patient Measurements: Height: '5\' 3"'$  (160 cm) Weight: 88.2 kg (194 lb 7.1 oz) IBW/kg (Calculated) : 52.4 Heparin Dosing Weight: 72kg  Vital Signs: Temp: 98.5 F (36.9 C) (09/18 2000) Temp Source: Oral (09/18 2000) BP: 107/68 (09/19 0000) Pulse Rate: 75 (09/19 0000)  Labs: Recent Labs    03/31/22 0703 03/31/22 2329  HGB 11.2*  --   HCT 33.9*  --   PLT 370  --   LABPROT 12.3  --   INR 0.9  --   HEPARINUNFRC  --  <0.10*  CREATININE 0.74  --      Estimated Creatinine Clearance: 84.6 mL/min (by C-G formula based on SCr of 0.74 mg/dL).   Medical History: Past Medical History:  Diagnosis Date   Anxiety    Arthritis    Bilateral shoulders   Asthma    Bipolar 1 disorder (HCC)    Bipolar disorder ([place])    CAD (coronary artery disease)    s/p stent BMS OM Cx   Cervical herniated disc 04/12/2016   COPD (chronic obstructive pulmonary disease) (HCC)    Depression    Diabetes mellitus with gastroparesis (HCC)    Diabetes mellitus without complication (HCC)    no meds, patient states Borderline"    Diverticulitis    GERD (gastroesophageal reflux disease)    Glaucoma    Hearing loss    bilateral   History of blood transfusion    Hyperlipidemia    Hypertension    Lower back pain    OSA (obstructive sleep apnea)    not using cpap    Plantar fasciitis    bilateral foot pain, b/l feet s/p steroid shots w/o help and left surgery w/o help   PONV (postoperative nausea and vomiting)    Spinal headache    UTI (urinary tract infection)     Assessment: 56 year old female s/p right common carotid arteriogram and stenting. Patient to continue on IV heparin overnight. INR normal, cbc appear normal. No bleeding issues noted post procedure, heparin started in PACU.  Heparin drip 500 uts/hr heparin level undetectable < 0.1 will increase slightly - planned to stop in am   Goal of Therapy:  Heparin level 0.1-0.25 units/ml Monitor platelets by anticoagulation protocol: Yes   Plan:  Increase Heparin drip 600 uts/hr Heparin off at 8am 9/19    [place].D. CPP, BCPS Clinical Pharmacist [phone] 04/01/2022 12:14 AM

## 2022-04-08 DIAGNOSIS — R109 Unspecified abdominal pain: Secondary | ICD-10-CM | POA: Diagnosis not present

## 2022-04-15 ENCOUNTER — Ambulatory Visit (HOSPITAL_COMMUNITY)
Admission: RE | Admit: 2022-04-15 | Discharge: 2022-04-15 | Disposition: A | Discharge status: Home / Self Care | Payer: Medicare Other | Source: Ambulatory Visit | Attending: Student | Admitting: Student

## 2022-04-15 ENCOUNTER — Other Ambulatory Visit (HOSPITAL_COMMUNITY): Payer: Self-pay | Admitting: Radiology

## 2022-04-15 DIAGNOSIS — I10 Essential (primary) hypertension: Secondary | ICD-10-CM | POA: Diagnosis not present

## 2022-04-15 DIAGNOSIS — I729 Aneurysm of unspecified site: Secondary | ICD-10-CM | POA: Diagnosis not present

## 2022-04-15 DIAGNOSIS — I2510 Atherosclerotic heart disease of native coronary artery without angina pectoris: Secondary | ICD-10-CM | POA: Diagnosis not present

## 2022-04-15 DIAGNOSIS — Z7902 Long term (current) use of antithrombotics/antiplatelets: Secondary | ICD-10-CM | POA: Insufficient documentation

## 2022-04-15 DIAGNOSIS — K219 Gastro-esophageal reflux disease without esophagitis: Secondary | ICD-10-CM | POA: Diagnosis not present

## 2022-04-15 DIAGNOSIS — K3184 Gastroparesis: Secondary | ICD-10-CM | POA: Diagnosis not present

## 2022-04-15 DIAGNOSIS — I671 Cerebral aneurysm, nonruptured: Secondary | ICD-10-CM | POA: Insufficient documentation

## 2022-04-15 DIAGNOSIS — Z7982 Long term (current) use of aspirin: Secondary | ICD-10-CM | POA: Insufficient documentation

## 2022-04-16 HISTORY — PX: IR RADIOLOGIST EVAL & MGMT: IMG5224

## 2022-04-21 DIAGNOSIS — H9193 Unspecified hearing loss, bilateral: Secondary | ICD-10-CM | POA: Diagnosis not present

## 2022-04-21 DIAGNOSIS — H9313 Tinnitus, bilateral: Secondary | ICD-10-CM | POA: Diagnosis not present

## 2022-04-21 DIAGNOSIS — G4733 Obstructive sleep apnea (adult) (pediatric): Secondary | ICD-10-CM | POA: Diagnosis not present

## 2022-04-25 DIAGNOSIS — M79671 Pain in right foot: Secondary | ICD-10-CM | POA: Diagnosis not present

## 2022-04-25 DIAGNOSIS — M79672 Pain in left foot: Secondary | ICD-10-CM | POA: Diagnosis not present

## 2022-04-25 DIAGNOSIS — M47817 Spondylosis without myelopathy or radiculopathy, lumbosacral region: Secondary | ICD-10-CM | POA: Diagnosis not present

## 2022-04-26 ENCOUNTER — Emergency Department: Payer: Medicare Other

## 2022-04-26 ENCOUNTER — Emergency Department
Admission: EM | Admit: 2022-04-26 | Discharge: 2022-04-26 | Disposition: A | Discharge status: Home / Self Care | Payer: Medicare Other | Attending: Student in an Organized Health Care Education/Training Program | Admitting: Student in an Organized Health Care Education/Training Program

## 2022-04-26 ENCOUNTER — Other Ambulatory Visit: Payer: Self-pay

## 2022-04-26 DIAGNOSIS — I2510 Atherosclerotic heart disease of native coronary artery without angina pectoris: Secondary | ICD-10-CM | POA: Diagnosis not present

## 2022-04-26 DIAGNOSIS — R0602 Shortness of breath: Secondary | ICD-10-CM | POA: Diagnosis not present

## 2022-04-26 DIAGNOSIS — R002 Palpitations: Secondary | ICD-10-CM

## 2022-04-26 DIAGNOSIS — R519 Headache, unspecified: Secondary | ICD-10-CM | POA: Diagnosis not present

## 2022-04-26 DIAGNOSIS — E119 Type 2 diabetes mellitus without complications: Secondary | ICD-10-CM | POA: Insufficient documentation

## 2022-04-26 DIAGNOSIS — J449 Chronic obstructive pulmonary disease, unspecified: Secondary | ICD-10-CM | POA: Insufficient documentation

## 2022-04-26 DIAGNOSIS — R000 Tachycardia, unspecified: Secondary | ICD-10-CM | POA: Diagnosis not present

## 2022-04-26 DIAGNOSIS — R911 Solitary pulmonary nodule: Secondary | ICD-10-CM | POA: Diagnosis not present

## 2022-04-26 DIAGNOSIS — R42 Dizziness and giddiness: Secondary | ICD-10-CM | POA: Diagnosis not present

## 2022-04-26 LAB — COMPREHENSIVE METABOLIC PANEL
ALT: 27 U/L (ref 0–44)
AST: 27 U/L (ref 15–41)
Albumin: 4 g/dL (ref 3.5–5.0)
Alkaline Phosphatase: 82 U/L (ref 38–126)
Anion gap: 10 (ref 5–15)
BUN: 16 mg/dL (ref 6–20)
CO2: 24 mmol/L (ref 22–32)
Calcium: 9 mg/dL (ref 8.9–10.3)
Chloride: 105 mmol/L (ref 98–111)
Creatinine, Ser: 0.76 mg/dL (ref 0.44–1.00)
GFR, Estimated: >60 mL/min (ref >60)
Glucose, Bld: 115 mg/dL — ABNORMAL HIGH (ref 70–99)
Potassium: 3.4 mmol/L — ABNORMAL LOW (ref 3.5–5.1)
Sodium: 139 mmol/L (ref 135–145)
Total Bilirubin: 0.4 mg/dL (ref 0.3–1.2)
Total Protein: 7.4 g/dL (ref 6.5–8.1)

## 2022-04-26 LAB — PROTIME-INR
INR: 1 (ref 0.8–1.2)
Prothrombin Time: 13.3 seconds (ref 11.4–15.2)

## 2022-04-26 LAB — CBC WITH DIFFERENTIAL/PLATELET
Abs Immature Granulocytes: 0.09 10*3/uL — ABNORMAL HIGH (ref 0.00–0.07)
Basophils Absolute: 0.1 10*3/uL (ref 0.0–0.1)
Basophils Relative: 0 %
Eosinophils Absolute: 0.1 10*3/uL (ref 0.0–0.5)
Eosinophils Relative: 1 %
HCT: 37.1 % (ref 36.0–46.0)
Hemoglobin: 12.3 g/dL (ref 12.0–15.0)
Immature Granulocytes: 1 %
Lymphocytes Relative: 29 %
Lymphs Abs: 3.5 10*3/uL (ref 0.7–4.0)
MCH: 28 pg (ref 26.0–34.0)
MCHC: 33.2 g/dL (ref 30.0–36.0)
MCV: 84.5 fL (ref 80.0–100.0)
Monocytes Absolute: 1.1 10*3/uL — ABNORMAL HIGH (ref 0.1–1.0)
Monocytes Relative: 9 %
Neutro Abs: 7.1 10*3/uL (ref 1.7–7.7)
Neutrophils Relative %: 60 %
Platelets: 494 10*3/uL — ABNORMAL HIGH (ref 150–400)
RBC: 4.39 MIL/uL (ref 3.87–5.11)
RDW: 14.7 % (ref 11.5–15.5)
WBC: 11.9 10*3/uL — ABNORMAL HIGH (ref 4.0–10.5)
nRBC: 0 % (ref 0.0–0.2)

## 2022-04-26 LAB — TSH: TSH: 1.785 u[IU]/mL (ref 0.350–4.500)

## 2022-04-26 LAB — BRAIN NATRIURETIC PEPTIDE: B Natriuretic Peptide: 13 pg/mL (ref 0.0–100.0)

## 2022-04-26 MED ORDER — IOHEXOL 350 MG/ML SOLN
75.0000 mL | Freq: Once | INTRAVENOUS | Status: AC | PRN
  Administered 2022-04-26: 75 mL via INTRAVENOUS

## 2022-04-26 MED ORDER — SODIUM CHLORIDE 0.9 % IV BOLUS
500.0000 mL | Freq: Once | INTRAVENOUS | Status: AC
  Administered 2022-04-26: 500 mL via INTRAVENOUS

## 2022-04-26 NOTE — ED Provider Notes (Signed)
[facility] Provider Note    Event Date/Time   First MD Initiated Contact with Patient 04/26/22 1546     (approximate)   History   Tachycardia   HPI  Shawna Anderson is a 56 y.o. female extensive past medical history including anxiety, CAD, COPD, diabetes, aneurysm status post clipping presents to the ER for evaluation of palpitations tachycardia and some shortness of breath.  She denies any chest pain.  Was also reported in triage she was having some headaches but these are currently resolved.     Physical Exam   Triage Vital Signs: ED Triage Vitals  Enc Vitals Group     BP 04/26/22 1210 (!) 176/90     Pulse Rate 04/26/22 1210 (!) 111     Resp 04/26/22 1210 18     Temp 04/26/22 1215 98.5 F (36.9 C)     Temp Source 04/26/22 1210 Oral     SpO2 04/26/22 1210 95 %     Weight 04/26/22 1212 202 lb (91.6 kg)     Height 04/26/22 1212 '5\' 3"'$  (1.6 m)     Head Circumference --      Peak Flow --      Pain Score 04/26/22 1212 0     Pain Loc --      Pain Edu? --      Excl. in [place]? --     Most recent vital signs: Vitals:   04/26/22 1700 04/26/22 1800  BP: (!) 157/77   Pulse: 95 93  Resp:  18  Temp:    SpO2: 96% 97%     Constitutional: Alert  Eyes: Conjunctivae are normal.  Head: Atraumatic. Nose: No congestion/rhinnorhea. Mouth/Throat: Mucous membranes are moist.   Neck: Painless ROM.  Cardiovascular:   Good peripheral circulation. Respiratory: Normal respiratory effort.  No retractions.  Gastrointestinal: Soft and nontender.  Musculoskeletal:  no deformity Neurologic:  MAE spontaneously. No gross focal neurologic deficits are appreciated.  Skin:  Skin is warm, dry and intact. No rash noted. Psychiatric: Mood and affect are normal. Speech and behavior are normal.    ED Results / Procedures / Treatments   Labs (all labs ordered are listed, but only abnormal results are displayed) Labs Reviewed  CBC WITH DIFFERENTIAL/PLATELET -  Abnormal; Notable for the following components:      Result Value   WBC 11.9 (*)    Platelets 494 (*)    Monocytes Absolute 1.1 (*)    Abs Immature Granulocytes 0.09 (*)    All other components within normal limits  COMPREHENSIVE METABOLIC PANEL - Abnormal; Notable for the following components:   Potassium 3.4 (*)    Glucose, Bld 115 (*)    All other components within normal limits  PROTIME-INR  TSH  BRAIN NATRIURETIC PEPTIDE     EKG  ED ECG REPORT I, Merlyn Lot, the attending physician, personally viewed and interpreted this ECG.   Date: 04/26/2022  EKG Time: 12:09  Rate: 105  Rhythm: sinus  Axis: normal  Intervals:normal qt  ST&T Change: nonspecific st abn, no stemi    RADIOLOGY Please see ED Course for my review and interpretation.  I personally reviewed all radiographic images ordered to evaluate for the above acute complaints and reviewed radiology reports and findings.  These findings were personally discussed with the patient.  Please see medical record for radiology report.    PROCEDURES:  Critical Care performed:   Procedures   MEDICATIONS ORDERED IN ED: Medications  sodium chloride  0.9 % bolus 500 mL (500 mLs Intravenous New Bag/Given 04/26/22 1641)  iohexol (OMNIPAQUE) 350 MG/ML injection 75 mL (75 mLs Intravenous Contrast Given 04/26/22 1745)     IMPRESSION / MDM / ASSESSMENT AND PLAN / ED COURSE  I reviewed the triage vital signs and the nursing notes.                              Differential diagnosis includes, but is not limited to, dysrhythmia, dehydration, anemia, PE, SAH, IPH, sepsis  Patient presenting to the ER for evaluation of symptoms as described above.  Based on symptoms, risk factors and considered above differential, this presenting complaint could reflect a potentially life-threatening illness therefore the patient will be placed on continuous pulse oximetry and telemetry for monitoring.  Laboratory evaluation will be sent  to evaluate for the above complaints.  T imaging ordered at triage of the head does not show any evidence of acute intracranial mass or bleed.  Based on her presentation will order chest x-ray will check labs.  EKG is nonischemic but is tachycardic.  Will order CTA she is recently postop to evaluate for PE.    Clinical Course as of 04/26/22 1820  Sat Apr 26, 2022  1611 Chest x-ray on my review and interpretation does not show any evidence of consolidation or infiltrates. [PR]  0240 CT imaging on my review and interpretation does not show any evidence of PE will await formal radiology report. [PR]  9735 CT imaging with no acute finding.  She feels well.  Her work-up is reassuring.  At this point I do believe she stable and appropriate for outpatient follow-up. [PR]    Clinical Course User Index [PR] Merlyn Lot, MD    FINAL CLINICAL IMPRESSION(S) / ED DIAGNOSES   Final diagnoses:  Tachycardia  Palpitations     Rx / DC Orders   ED Discharge Orders     None        Note:  This document was prepared using Dragon voice recognition software and may include unintentional dictation errors.    Merlyn Lot, MD 04/26/22 Tresa Moore

## 2022-04-26 NOTE — ED Triage Notes (Signed)
Pt states that she has been having tachycardia since yesterday- pt states that it has been all over the place with a rate as high as 133- pt states that on Sept 18th she had a surgery for her brain aneurysm and is worried it could be related to that- pt states during one of her episodes of her tachycardia she got diaphoretic and light headed- pt states the tachycardia comes with exertion

## 2022-04-26 NOTE — ED Provider Triage Note (Signed)
  Emergency Medicine Provider Triage Evaluation Note  Shawna Anderson , a 56 y.o.female,  was evaluated in triage.  Pt complains of tachycardia.  Patient states that yesterday her heart rate was "all over the place".  She states it was as high as 133, particular when she gets up to walk around.  She states that she gets diaphoretic and lightheaded with exertional activities.  She additionally states that she he had a surgery for her brain aneurysm on September 18 and is concerned that this may be related.   Review of Systems  Positive: Tachycardia, diaphoresis, lightheadedness Negative: Denies fever, chest pain, vomiting  Physical Exam   Vitals:   04/26/22 1210 04/26/22 1215  BP: (!) 176/90   Pulse: (!) 111   Resp: 18   Temp:  98.5 F (36.9 C)  SpO2: 95%    Gen:   Awake, no distress   Resp:  Normal effort  MSK:   Moves extremities without difficulty  Other:    Medical Decision Making  Given the patient's initial medical screening exam, the following diagnostic evaluation has been ordered. The patient will be placed in the appropriate treatment space, once one is available, to complete the evaluation and treatment. I have discussed the plan of care with the patient and I have advised the patient that an ED physician or mid-level practitioner will reevaluate their condition after the test results have been received, as the results may give them additional insight into the type of treatment they may need.    Diagnostics: Labs, head CT, EKG, CXR  Treatments: none immediately   Teodoro Spray, [place] 04/26/22 1227

## 2022-05-01 ENCOUNTER — Telehealth: Payer: Self-pay | Admitting: *Deleted

## 2022-05-01 NOTE — Telephone Encounter (Signed)
     Patient  visit on 04/26/2022  at [facility]  was for [place]  Have you been able to follow up with your primary care physician? Yes has appt on 26th of this month  The patient was able to obtain any needed medicine or equipment.  Are there diet recommendations that you are having difficulty following?  Patient expresses understanding of discharge instructions and education provided has no other needs at this time.    [place] [phone] [address] , [place] [address] Email : [email or web address]

## 2022-05-02 LAB — PLATELET INHIBITION P2Y12

## 2022-05-04 DIAGNOSIS — G4730 Sleep apnea, unspecified: Secondary | ICD-10-CM | POA: Diagnosis not present

## 2022-05-06 ENCOUNTER — Other Ambulatory Visit: Payer: Self-pay | Admitting: Medical

## 2022-05-07 DIAGNOSIS — I10 Essential (primary) hypertension: Secondary | ICD-10-CM | POA: Diagnosis not present

## 2022-05-07 DIAGNOSIS — R55 Syncope and collapse: Secondary | ICD-10-CM | POA: Diagnosis not present

## 2022-05-07 DIAGNOSIS — I2510 Atherosclerotic heart disease of native coronary artery without angina pectoris: Secondary | ICD-10-CM | POA: Diagnosis not present

## 2022-05-07 DIAGNOSIS — Z955 Presence of coronary angioplasty implant and graft: Secondary | ICD-10-CM | POA: Diagnosis not present

## 2022-05-08 DIAGNOSIS — M542 Cervicalgia: Secondary | ICD-10-CM | POA: Diagnosis not present

## 2022-05-08 DIAGNOSIS — R911 Solitary pulmonary nodule: Secondary | ICD-10-CM | POA: Diagnosis not present

## 2022-05-23 ENCOUNTER — Other Ambulatory Visit: Payer: Self-pay | Admitting: *Deleted

## 2022-05-23 DIAGNOSIS — F319 Bipolar disorder, unspecified: Secondary | ICD-10-CM

## 2022-05-23 NOTE — Telephone Encounter (Signed)
Pt last seen by Dr. Olivia Mackie Mclean-Scocuzza on 01/23/22. No future appt scheduled

## 2022-05-25 MED ORDER — DIVALPROEX SODIUM 500 MG PO DR TAB: 500.0000 mg | DELAYED_RELEASE_TABLET | Freq: Two times a day (BID) | ORAL | 0 refills | Status: AC

## 2022-06-13 ENCOUNTER — Other Ambulatory Visit: Payer: Self-pay | Admitting: Medical

## 2022-06-17 ENCOUNTER — Other Ambulatory Visit: Payer: Self-pay | Admitting: Medical

## 2022-06-30 ENCOUNTER — Telehealth: Payer: Self-pay

## 2022-06-30 NOTE — Telephone Encounter (Signed)
Shawna Anderson (Key: [id number]) Repatha SureClick '140MG'$ /ML auto-injectors  Wait for Determination Please wait for [employer] 2017 NCPDP to return a determination.

## 2022-07-01 ENCOUNTER — Other Ambulatory Visit (HOSPITAL_COMMUNITY): Payer: Self-pay | Admitting: Interventional Radiology

## 2022-07-01 ENCOUNTER — Encounter (HOSPITAL_COMMUNITY): Payer: Self-pay

## 2022-07-01 DIAGNOSIS — I671 Cerebral aneurysm, nonruptured: Secondary | ICD-10-CM

## 2022-07-03 ENCOUNTER — Ambulatory Visit (HOSPITAL_COMMUNITY)
Admission: RE | Admit: 2022-07-03 | Discharge: 2022-07-03 | Disposition: A | Discharge status: Home / Self Care | Payer: Medicare Other | Source: Ambulatory Visit | Attending: Interventional Radiology | Admitting: Interventional Radiology

## 2022-07-03 ENCOUNTER — Telehealth (HOSPITAL_COMMUNITY): Payer: Self-pay

## 2022-07-03 NOTE — Telephone Encounter (Signed)
-----   Message from Lura Em, [place] sent at 07/01/2022  4:08 PM EST ----- Hi, is it possible to bring this patient in for an appointment with Dev? I got a call from an anesthesia NP regarding the patient's anticoagulation and they want her to be seen for anticoagulation management before she can have her procedure. Thank you! Best, Edson Snowball

## 2022-07-09 ENCOUNTER — Telehealth (HOSPITAL_COMMUNITY): Payer: Self-pay

## 2022-07-09 NOTE — Telephone Encounter (Signed)
-----   Message from [place], [place] sent at 07/08/2022  9:28 AM EST ----- Regarding: RE: Plavix hold Caryl Pina- I did not see any type of form to send back. My apologies if I missed this.  You may relay the approval for 5 day plavix hold with resuming the day following the procedure and include a reminder to continue the ASA.  This one is not to be held.  Thanks, Hayley ----- Message ----- From: Danielle Dess Sent: 07/08/2022   9:16 AM EST To: Pasty Spillers, PA; Joanell Rising Subject: RE: Plavix hold                                Hayley,   Were you able to fax the paperwork back to the office? They left me a vm that they didn't receive it back.   Thanks,  Lia Foyer ----- Message ----- From: Pasty Spillers, PA Sent: 07/03/2022   1:47 PM EST To: Joanell Rising; Danielle Dess Subject: Plavix hold                                    Received request for Plavix hold on this patient for EGD from [facility].  Caryl Pina, I think you have a copy of it.  Dev says she can hold Plavix for 5 days but must not stop her aspirin.  She needs to begin again with the next dose after the procedure.    Thanks, [employer]

## 2022-07-11 ENCOUNTER — Other Ambulatory Visit: Payer: Self-pay | Admitting: Medical

## 2022-07-19 ENCOUNTER — Other Ambulatory Visit: Payer: Self-pay | Admitting: Medical

## 2022-07-22 ENCOUNTER — Other Ambulatory Visit (HOSPITAL_COMMUNITY): Payer: Self-pay

## 2022-07-25 ENCOUNTER — Other Ambulatory Visit (HOSPITAL_COMMUNITY): Payer: Self-pay

## 2022-08-27 ENCOUNTER — Other Ambulatory Visit: Payer: Medicare Other

## 2022-08-27 ENCOUNTER — Ambulatory Visit: Payer: Medicare Other | Admitting: Oncology

## 2022-09-18 ENCOUNTER — Other Ambulatory Visit: Payer: Self-pay | Admitting: Medical

## 2022-09-18 DIAGNOSIS — E876 Hypokalemia: Secondary | ICD-10-CM

## 2022-09-19 ENCOUNTER — Other Ambulatory Visit: Payer: Self-pay | Admitting: Family

## 2022-09-19 DIAGNOSIS — F319 Bipolar disorder, unspecified: Secondary | ICD-10-CM

## 2022-09-22 ENCOUNTER — Other Ambulatory Visit: Payer: Self-pay | Admitting: Medical

## 2022-09-22 DIAGNOSIS — E876 Hypokalemia: Secondary | ICD-10-CM

## 2022-10-09 ENCOUNTER — Other Ambulatory Visit (HOSPITAL_COMMUNITY): Payer: Self-pay | Admitting: Interventional Radiology

## 2022-10-09 DIAGNOSIS — I671 Cerebral aneurysm, nonruptured: Secondary | ICD-10-CM

## 2022-10-13 ENCOUNTER — Other Ambulatory Visit: Payer: Self-pay | Admitting: Medical

## 2022-10-13 DIAGNOSIS — E876 Hypokalemia: Secondary | ICD-10-CM

## 2022-10-23 ENCOUNTER — Other Ambulatory Visit: Payer: Self-pay | Admitting: Medical

## 2022-10-23 DIAGNOSIS — E876 Hypokalemia: Secondary | ICD-10-CM

## 2022-10-28 LAB — PLATELET INHIBITION P2Y12

## 2022-10-29 ENCOUNTER — Other Ambulatory Visit: Payer: Self-pay | Admitting: Medical

## 2022-11-03 ENCOUNTER — Ambulatory Visit (HOSPITAL_COMMUNITY)
Admission: RE | Admit: 2022-11-03 | Discharge: 2022-11-03 | Disposition: A | Discharge status: Home / Self Care | Payer: 59 | Source: Ambulatory Visit | Attending: Interventional Radiology | Admitting: Interventional Radiology

## 2022-11-03 DIAGNOSIS — I671 Cerebral aneurysm, nonruptured: Secondary | ICD-10-CM | POA: Insufficient documentation

## 2022-11-03 MED ORDER — SODIUM CHLORIDE (PF) 0.9 % IJ SOLN
INTRAMUSCULAR | Status: AC
  Filled 2022-11-03: qty 50

## 2022-11-03 MED ORDER — IOHEXOL 350 MG/ML SOLN
75.0000 mL | Freq: Once | INTRAVENOUS | Status: AC | PRN
  Administered 2022-11-03: 75 mL via INTRAVENOUS

## 2022-11-11 ENCOUNTER — Other Ambulatory Visit: Payer: Self-pay | Admitting: Medical

## 2022-11-11 ENCOUNTER — Telehealth (HOSPITAL_COMMUNITY): Payer: Self-pay

## 2022-11-11 DIAGNOSIS — E876 Hypokalemia: Secondary | ICD-10-CM

## 2022-11-11 NOTE — Telephone Encounter (Signed)
Pt agreed to f/u in 6 months with a cta. AB

## 2022-12-28 ENCOUNTER — Other Ambulatory Visit: Payer: Self-pay | Admitting: Medical

## 2022-12-28 DIAGNOSIS — I25118 Atherosclerotic heart disease of native coronary artery with other forms of angina pectoris: Secondary | ICD-10-CM

## 2023-03-07 ENCOUNTER — Other Ambulatory Visit: Payer: Self-pay | Admitting: Medical

## 2023-03-07 DIAGNOSIS — E785 Hyperlipidemia, unspecified: Secondary | ICD-10-CM

## 2023-03-07 DIAGNOSIS — I10 Essential (primary) hypertension: Secondary | ICD-10-CM

## 2023-03-07 DIAGNOSIS — I2510 Atherosclerotic heart disease of native coronary artery without angina pectoris: Secondary | ICD-10-CM

## 2023-04-03 ENCOUNTER — Other Ambulatory Visit: Payer: Self-pay | Admitting: Medical

## 2023-04-03 DIAGNOSIS — Z9861 Coronary angioplasty status: Secondary | ICD-10-CM

## 2023-04-03 DIAGNOSIS — I10 Essential (primary) hypertension: Secondary | ICD-10-CM

## 2023-04-03 DIAGNOSIS — E785 Hyperlipidemia, unspecified: Secondary | ICD-10-CM

## 2023-05-13 ENCOUNTER — Other Ambulatory Visit (HOSPITAL_COMMUNITY): Payer: Self-pay | Admitting: Interventional Radiology

## 2023-05-13 ENCOUNTER — Telehealth (HOSPITAL_COMMUNITY): Payer: Self-pay

## 2023-05-13 DIAGNOSIS — I671 Cerebral aneurysm, nonruptured: Secondary | ICD-10-CM

## 2023-05-13 NOTE — Telephone Encounter (Signed)
Called to schedule cta, number in system isn't in service. AB

## 2023-08-22 ENCOUNTER — Other Ambulatory Visit: Payer: Self-pay | Admitting: Medical

## 2023-08-22 DIAGNOSIS — E876 Hypokalemia: Secondary | ICD-10-CM

## 2023-08-24 NOTE — Telephone Encounter (Signed)
 Unable to reach patient to confirm changed of care with Maurine Sovereign, MD  at [facility] , has  not seen a [facility] MD since 02/2022

## 2023-09-14 ENCOUNTER — Other Ambulatory Visit: Payer: Self-pay

## 2023-09-14 ENCOUNTER — Encounter (HOSPITAL_COMMUNITY): Payer: Self-pay

## 2023-09-14 ENCOUNTER — Emergency Department (HOSPITAL_COMMUNITY)
Admission: EM | Admit: 2023-09-14 | Discharge: 2023-09-15 | Disposition: A | Discharge status: Home / Self Care | Attending: Student | Admitting: Student

## 2023-09-14 DIAGNOSIS — R112 Nausea with vomiting, unspecified: Secondary | ICD-10-CM | POA: Diagnosis not present

## 2023-09-14 DIAGNOSIS — J449 Chronic obstructive pulmonary disease, unspecified: Secondary | ICD-10-CM | POA: Diagnosis not present

## 2023-09-14 DIAGNOSIS — E119 Type 2 diabetes mellitus without complications: Secondary | ICD-10-CM | POA: Insufficient documentation

## 2023-09-14 DIAGNOSIS — R1084 Generalized abdominal pain: Secondary | ICD-10-CM | POA: Insufficient documentation

## 2023-09-14 DIAGNOSIS — I2510 Atherosclerotic heart disease of native coronary artery without angina pectoris: Secondary | ICD-10-CM | POA: Insufficient documentation

## 2023-09-14 DIAGNOSIS — K649 Unspecified hemorrhoids: Secondary | ICD-10-CM

## 2023-09-14 DIAGNOSIS — I10 Essential (primary) hypertension: Secondary | ICD-10-CM | POA: Diagnosis not present

## 2023-09-14 DIAGNOSIS — Z79899 Other long term (current) drug therapy: Secondary | ICD-10-CM | POA: Insufficient documentation

## 2023-09-14 DIAGNOSIS — Z7982 Long term (current) use of aspirin: Secondary | ICD-10-CM | POA: Insufficient documentation

## 2023-09-14 DIAGNOSIS — R1033 Periumbilical pain: Secondary | ICD-10-CM | POA: Diagnosis present

## 2023-09-14 DIAGNOSIS — J45909 Unspecified asthma, uncomplicated: Secondary | ICD-10-CM | POA: Diagnosis not present

## 2023-09-14 DIAGNOSIS — K3184 Gastroparesis: Secondary | ICD-10-CM

## 2023-09-14 DIAGNOSIS — D72829 Elevated white blood cell count, unspecified: Secondary | ICD-10-CM | POA: Diagnosis not present

## 2023-09-14 DIAGNOSIS — K644 Residual hemorrhoidal skin tags: Secondary | ICD-10-CM | POA: Insufficient documentation

## 2023-09-14 DIAGNOSIS — Z91040 Latex allergy status: Secondary | ICD-10-CM | POA: Diagnosis not present

## 2023-09-14 DIAGNOSIS — Z87891 Personal history of nicotine dependence: Secondary | ICD-10-CM | POA: Diagnosis not present

## 2023-09-14 MED ORDER — ONDANSETRON HCL 4 MG/2ML IJ SOLN
4.0000 mg | Freq: Once | INTRAMUSCULAR | Status: AC | PRN
  Administered 2023-09-15: 4 mg via INTRAVENOUS
  Filled 2023-09-14: qty 2

## 2023-09-14 NOTE — ED Triage Notes (Signed)
 Pt reports LLQ pain with radiation to umbilicus since this morning. Pt also endorses vomiting and states "I feel like I'm tasting blood".

## 2023-09-15 LAB — LIPASE, BLOOD: Lipase: 33 U/L (ref 11–51)

## 2023-09-15 LAB — URINALYSIS, ROUTINE W REFLEX MICROSCOPIC
Bacteria, UA: NONE SEEN
Bilirubin Urine: NEGATIVE
Glucose, UA: NEGATIVE mg/dL
Ketones, ur: NEGATIVE mg/dL
Leukocytes,Ua: NEGATIVE
Nitrite: NEGATIVE
Protein, ur: NEGATIVE mg/dL
Specific Gravity, Urine: 1.015 (ref 1.005–1.030)
pH: 7 (ref 5.0–8.0)

## 2023-09-15 LAB — CBC
HCT: 40.9 % (ref 36.0–46.0)
Hemoglobin: 13.6 g/dL (ref 12.0–15.0)
MCH: 29.2 pg (ref 26.0–34.0)
MCHC: 33.3 g/dL (ref 30.0–36.0)
MCV: 87.8 fL (ref 80.0–100.0)
Platelets: 536 10*3/uL — ABNORMAL HIGH (ref 150–400)
RBC: 4.66 MIL/uL (ref 3.87–5.11)
RDW: 14.5 % (ref 11.5–15.5)
WBC: 21.3 10*3/uL — ABNORMAL HIGH (ref 4.0–10.5)
nRBC: 0 % (ref 0.0–0.2)

## 2023-09-15 LAB — COMPREHENSIVE METABOLIC PANEL
ALT: 38 U/L (ref 0–44)
AST: 30 U/L (ref 15–41)
Albumin: 4.1 g/dL (ref 3.5–5.0)
Alkaline Phosphatase: 95 U/L (ref 38–126)
Anion gap: 15 (ref 5–15)
BUN: 19 mg/dL (ref 6–20)
CO2: 21 mmol/L — ABNORMAL LOW (ref 22–32)
Calcium: 8.7 mg/dL — ABNORMAL LOW (ref 8.9–10.3)
Chloride: 102 mmol/L (ref 98–111)
Creatinine, Ser: 0.77 mg/dL (ref 0.44–1.00)
GFR, Estimated: >60 mL/min (ref >60)
Glucose, Bld: 161 mg/dL — ABNORMAL HIGH (ref 70–99)
Potassium: 3.7 mmol/L (ref 3.5–5.1)
Sodium: 138 mmol/L (ref 135–145)
Total Bilirubin: 1.3 mg/dL — ABNORMAL HIGH (ref 0.0–1.2)
Total Protein: 7.5 g/dL (ref 6.5–8.1)

## 2023-09-15 LAB — I-STAT CG4 LACTIC ACID, ED: Lactic Acid, Venous: 1.7 mmol/L (ref 0.5–1.9)

## 2023-09-15 MED ORDER — HYDROCORTISONE ACETATE 25 MG RE SUPP: 25.0000 mg | Freq: Two times a day (BID) | RECTAL | 0 refills | Status: AC

## 2023-09-15 MED ORDER — KETOROLAC TROMETHAMINE 15 MG/ML IJ SOLN
15.0000 mg | Freq: Once | INTRAMUSCULAR | Status: DC
  Filled 2023-09-15: qty 1

## 2023-09-15 MED ORDER — DIAZEPAM 5 MG/ML IJ SOLN
5.0000 mg | Freq: Once | INTRAMUSCULAR | Status: AC
  Administered 2023-09-15: 5 mg via INTRAVENOUS
  Filled 2023-09-15: qty 2

## 2023-09-15 MED ORDER — PROMETHAZINE HCL 25 MG RE SUPP: 25.0000 mg | Freq: Four times a day (QID) | RECTAL | 0 refills | Status: AC | PRN

## 2023-09-15 MED ORDER — LACTATED RINGERS IV BOLUS
1000.0000 mL | Freq: Once | INTRAVENOUS | Status: AC
  Administered 2023-09-15: 1000 mL via INTRAVENOUS

## 2023-09-15 MED ORDER — PROCHLORPERAZINE EDISYLATE 10 MG/2ML IJ SOLN
10.0000 mg | Freq: Once | INTRAMUSCULAR | Status: AC
  Administered 2023-09-15: 10 mg via INTRAVENOUS
  Filled 2023-09-15: qty 2

## 2023-09-15 NOTE — ED Notes (Signed)
 [place] PIV placed.

## 2023-09-15 NOTE — ED Notes (Signed)
 Pt ambulatory, maintaining steady and equal gait, without staff assist.

## 2023-09-15 NOTE — ED Notes (Signed)
 Provided pt with [place] sandwich, orange juice per request.

## 2023-09-15 NOTE — ED Provider Notes (Signed)
 [place] EMERGENCY DEPARTMENT AT [facility] Provider Note  CSN: [id number] Arrival date & time: 09/14/23 2324  Chief Complaint(s) Emesis and Abdominal Pain  HPI Shawna Anderson is a 58 y.o. female with PMH bipolar 1, CAD status post DES placement, COPD, T2DM with gastroparesis, GERD, HTN, HLD, cannabinoid hyperemesis who presents emergency room for evaluation of abdominal pain nausea and vomiting.  Patient has been seen multiple times including a recent hospital admission on 07/08/2023, ER presentation on 08/20/2023, and ER presentation yesterday with negative CT imaging.  Thought to be cannabinoid hyperemesis at that time.  Presents to different emergency department today with continued periumbilical abdominal pain nausea and vomiting.  Does endorse persistent marijuana use.  Denies associated chest pain, shortness of breath, headache, fever or other systemic symptoms.  States that she was recently increased from 2.5-5 on her Ozempic.   Past Medical History Past Medical History:  Diagnosis Date   Anxiety    Arthritis    Bilateral shoulders   Asthma    Bipolar 1 disorder (HCC)    Bipolar disorder (HCC)    CAD (coronary artery disease)    s/p stent BMS OM Cx   Cervical herniated disc 04/12/2016   COPD (chronic obstructive pulmonary disease) (HCC)    Depression    Diabetes mellitus with gastroparesis (HCC)    Diabetes mellitus without complication (HCC)    no meds, patient states Borderline"   Diverticulitis    GERD (gastroesophageal reflux disease)    Glaucoma    Hearing loss    bilateral   History of blood transfusion    Hyperlipidemia    Hypertension    Lower back pain    OSA (obstructive sleep apnea)    not using cpap    Plantar fasciitis    bilateral foot pain, b/l feet s/p steroid shots w/o help and left surgery w/o help   PONV (postoperative nausea and vomiting)    Spinal headache    UTI (urinary tract infection)    Patient Active Problem List    Diagnosis Date Noted   Need for Tdap vaccination 01/23/2022   Nocturnal hypoxia 12/27/2021   Plantar fasciitis, bilateral 12/27/2021   Pain of midfoot 12/27/2021   Other chronic pain 12/27/2021   Bursitis of right shoulder 12/27/2021   Status post coil embolization of cerebral aneurysm 12/25/2021   Brain aneurysm 12/25/2021   Prediabetes 10/28/2021   PAD (peripheral artery disease) (HCC) 10/01/2021   Aneurysm (HCC) 09/28/2021   Empty sella syndrome (HCC) 09/12/2021   Idiopathic intracranial hypertension 09/10/2021   Claudication (HCC) 08/21/2021   Nausea & vomiting 04/24/2021   Polysubstance abuse (HCC) 04/22/2021   Hypertension    Intractable nausea and vomiting 02/19/2021   Diabetes mellitus without complication (HCC)    Hypertension associated with diabetes (HCC) 01/23/2021   Obesity (BMI 30-39.9) 01/23/2021   Gastroparesis    AKI (acute kidney injury) (HCC) 12/31/2020   Transaminitis 12/31/2020   Acute diverticulitis 12/17/2020   Chronic pain syndrome 12/05/2020   Acute bronchitis with COPD (HCC) 11/28/2020   Accelerating angina (HCC) 09/11/2020   SUI (stress urinary incontinence, female) 03/26/2020   Bronchitis 03/26/2020   URI with cough and congestion 03/01/2020   Esophageal dysphagia 01/26/2020   Schatzki's ring of distal esophagus 01/26/2020   Ecchymosis 01/11/2020   Chronic pain of both knees 01/11/2020   Bruising 12/05/2019   Trichomonas infection 12/05/2019   Elevated liver enzymes 11/15/2019   Diabetes mellitus with gastroparesis (HCC) 11/15/2019   STD exposure  11/15/2019   Anemia 11/15/2019   Chronic bursitis of right shoulder 10/25/2019   Chest wall pain 09/05/2019   Poor dentition 08/18/2019   Thrombocytosis 08/18/2019   Leukocytosis 08/18/2019   Anal lesion 08/18/2019   Female pelvic pain 05/26/2019   Colitis 05/26/2019   HLD (hyperlipidemia) 12/27/2018   Vitamin D deficiency 12/24/2018   Bipolar disorder (HCC) 12/30/2017   OSA (obstructive sleep  apnea) 12/30/2017   Constipation 12/30/2017   Coronary artery disease of native artery of native heart with stable angina pectoris (HCC) 12/30/2017   Gastroesophageal reflux disease 12/30/2017   Asthma 12/29/2017   COPD (chronic obstructive pulmonary disease) (HCC) 12/29/2017   Anxiety and depression 12/29/2017   Insomnia 12/29/2017   Chronic back pain 12/29/2017   Skin lesion of back 12/29/2017   CAD S/P CFX PCI 2010 04/15/2016   Essential hypertension 04/15/2016   Hyperlipidemia associated with type 2 diabetes mellitus (HCC) 04/15/2016   Chest pain with moderate risk of acute coronary syndrome 04/12/2016   Chronic abdominal pain 01/12/2015   Vomiting 01/12/2015   Nausea and vomiting 01/12/2015   Home Medication(s) Prior to Admission medications   Medication Sig Start Date End Date Taking? Authorizing Provider  albuterol (PROAIR HFA) 108 (90 Base) MCG/ACT inhaler Inhale 1-2 puffs into the lungs every 4 (four) hours as needed for wheezing or shortness of breath. Patient taking differently: Inhale 1-2 puffs into the lungs 2 (two) times daily. 11/28/21   McLean-Scocuzza, Pasty Spillers, MD  albuterol (PROVENTIL) (2.5 MG/3ML) 0.083% nebulizer solution Take 2.5 mg by nebulization every 6 (six) hours as needed for wheezing or shortness of breath.    [provider]  amLODipine (NORVASC) 10 MG tablet TAKE 1 TABLET BY MOUTH AT BEDTIME 04/03/23   Lewayne Bunting, MD  aspirin 81 MG chewable tablet Chew 81 mg by mouth daily.    [provider]  atorvastatin (LIPITOR) 80 MG tablet Take 1 tablet (80 mg total) by mouth daily. Patient taking differently: Take 80 mg by mouth at bedtime. 02/17/22   Furth, Cadence H, PA-C  Budeson-Glycopyrrol-Formoterol (BREZTRI AEROSPHERE) 160-9-4.8 MCG/ACT AERO Inhale 2 puffs into the lungs in the morning and at bedtime. Rinse 06/13/21   Salena Saner, MD  clopidogrel (PLAVIX) 75 MG tablet Take 1 tablet (75 mg total) by mouth daily. 02/17/22   Furth, Cadence  H, PA-C  dicyclomine (BENTYL) 20 MG tablet Take 1 tablet (20 mg total) by mouth 4 (four) times daily -  before meals and at bedtime. Patient taking differently: Take 20 mg by mouth daily as needed (Vomiting). 09/12/21   McLean-Scocuzza, Pasty Spillers, MD  divalproex (DEPAKOTE) 500 MG DR tablet Take 1 tablet (500 mg total) by mouth 2 (two) times daily. 05/25/22   Eulis Foster, FNP  esomeprazole (NEXIUM) 40 MG capsule Take 40 mg by mouth in the morning and at bedtime.    [provider]  ezetimibe (ZETIA) 10 MG tablet TAKE 1 TABLET BY MOUTH DAILY WITH LIPITOR 80MG  AT NIGHT 04/03/23   Lewayne Bunting, MD  famotidine (PEPCID) 40 MG tablet Take 40 mg by mouth daily.    [provider]  fluticasone (FLONASE) 50 MCG/ACT nasal spray Place 2 sprays into both nostrils daily as needed for allergies or rhinitis. Patient taking differently: Place 2 sprays into both nostrils daily. 12/27/21   McLean-Scocuzza, Pasty Spillers, MD  gabapentin (NEURONTIN) 300 MG capsule Take 300 mg by mouth at bedtime.    [provider]  hydrochlorothiazide (MICROZIDE) 12.5 MG  capsule TAKE 1 CAPSULE BY MOUTH DAILY 06/13/22   End, Cristal Deer, MD  ipratropium (ATROVENT) 0.06 % nasal spray USE 2 SPRAYS IN BOTH  NOSTRILS 3 TIMES DAILY Patient taking differently: Place 2 sprays into both nostrils daily. 11/25/21   McLean-Scocuzza, Pasty Spillers, MD  Ipratropium-Albuterol (COMBIVENT RESPIMAT) 20-100 MCG/ACT AERS respimat Inhale 1 puff into the lungs 2 (two) times daily.    [provider]  ipratropium-albuterol (DUONEB) 0.5-2.5 (3) MG/3ML SOLN Take 3 mLs by nebulization every 6 (six) hours as needed. Patient taking differently: Take 3 mLs by nebulization every 6 (six) hours as needed (Shortness of breath & bronchitis). 11/28/21   McLean-Scocuzza, Pasty Spillers, MD  isosorbide mononitrate (IMDUR) 60 MG 24 hr tablet Take 1 tablet (60 mg total) by mouth daily. Patient taking differently: Take 90 mg by mouth daily. 02/17/22   Furth,  Cadence H, PA-C  levocetirizine (XYZAL) 5 MG tablet Take 5 mg by mouth at bedtime as needed for allergies. 10/25/20   [provider]  metoCLOPramide (REGLAN) 5 MG tablet TAKE 1 TABLET BY MOUTH 4  TIMES DAILY BEFORE MEALS  AND AT BEDTIME Patient taking differently: Take 5 mg by mouth 4 (four) times daily -  before meals and at bedtime. 06/10/21   Toney Reil, MD  metoprolol succinate (TOPROL-XL) 25 MG 24 hr tablet Take 25 mg by mouth at bedtime. 03/11/22   [provider]  naloxone [facility]) nasal spray 4 mg/0.1 mL Place 4 mg into the nose as needed for opioid reversal. 01/02/22   [provider]  nitroGLYCERIN (NITROSTAT) 0.4 MG SL tablet DISSOLVE 1 TABLET UNDER  TONGUE EVERY 5 MINUTES AS  NEEDED FOR CHEST PAIN MAX  OF 3 TABLETS IN 15 MINUTES  . CALL 911 AFTER THIRD DOSE Patient taking differently: Place 0.4 mg under the tongue every 5 (five) minutes x 3 doses as needed for chest pain. 10/16/21   Alver Sorrow, NP  ondansetron (ZOFRAN-ODT) 4 MG disintegrating tablet DISSOLVE 1 TABLET ON TOP OF THE  TONGUE EVERY 8 HOURS AS NEEDED  FOR NAUEA OR VOMITING Patient taking differently: Take 4 mg by mouth every 8 (eight) hours as needed for nausea or vomiting. 11/21/21   McLean-Scocuzza, Pasty Spillers, MD  potassium chloride (MICRO-K) 10 MEQ CR capsule TAKE 2 CAPSULES BY MOUTH DAILY 11/12/22   Furth, Cadence H, PA-C  promethazine (PHENERGAN) 25 MG tablet TAKE 1 TABLET BY MOUTH TWICE  DAILY AS NEEDED FOR NAUSEA OR  VOMITING Patient taking differently: Take 25 mg by mouth 2 (two) times daily as needed for nausea or vomiting. 11/26/21   McLean-Scocuzza, Pasty Spillers, MD  REPATHA SURECLICK 140 MG/ML SOAJ INJECT 140MG  SUBCUTANEOUSLY  EVERY 2 WEEKS 10/29/22   Furth, Cadence H, PA-C  sodium chloride (OCEAN) 0.65 % SOLN nasal spray Place 2 sprays into both nostrils daily as needed for congestion. Patient not taking: Reported on 03/31/2022 12/27/21   McLean-Scocuzza, Pasty Spillers, MD  topiramate (TOPAMAX)  50 MG tablet Take 50 mg by mouth at bedtime.    [provider]  Past Surgical History Past Surgical History:  Procedure Laterality Date   ABDOMINAL HYSTERECTOMY     ABDOMINAL SURGERY  1995   Bowel resection.   CARDIAC CATHETERIZATION N/A 04/14/2016   Procedure: Left Heart Cath and Coronary Angiography;  Surgeon: Kathleene Hazel, MD;  Location: [facility] INVASIVE CV LAB;  Service: Cardiovascular;  Laterality: N/A;   CARDIAC SURGERY     COLONOSCOPY WITH PROPOFOL N/A 07/11/2019   Procedure: COLONOSCOPY WITH PROPOFOL;  Surgeon: Wyline Mood, MD;  Location: [facility] ENDOSCOPY;  Service: Gastroenterology;  Laterality: N/A;   COLONOSCOPY WITH PROPOFOL N/A 07/29/2019   Procedure: COLONOSCOPY WITH PROPOFOL;  Surgeon: Wyline Mood, MD;  Location: [facility] ENDOSCOPY;  Service: Gastroenterology;  Laterality: N/A;   CORONARY ANGIOPLASTY WITH STENT PLACEMENT  2010   Drug eluting stent   ESOPHAGOGASTRODUODENOSCOPY (EGD) WITH PROPOFOL N/A 07/11/2019   Procedure: ESOPHAGOGASTRODUODENOSCOPY (EGD) WITH PROPOFOL;  Surgeon: Wyline Mood, MD;  Location: [facility] ENDOSCOPY;  Service: Gastroenterology;  Laterality: N/A;   IR 3D INDEPENDENT WKST  11/08/2021   IR 3D INDEPENDENT WKST  12/25/2021   IR 3D INDEPENDENT WKST  03/31/2022   IR ANGIO EXTERNAL CAROTID SEL EXT CAROTID UNI R MOD SED  03/31/2022   IR ANGIO INTRA EXTRACRAN SEL COM CAROTID INNOMINATE BILAT MOD SED  11/08/2021   IR ANGIO INTRA EXTRACRAN SEL COM CAROTID INNOMINATE UNI R MOD SED  12/25/2021   IR ANGIO INTRA EXTRACRAN SEL INTERNAL CAROTID UNI R MOD SED  03/31/2022   IR ANGIO VERTEBRAL SEL SUBCLAVIAN INNOMINATE UNI R MOD SED  03/31/2022   IR ANGIO VERTEBRAL SEL VERTEBRAL BILAT MOD SED  11/08/2021   IR ANGIOGRAM FOLLOW UP STUDY  12/25/2021   IR ANGIOGRAM FOLLOW UP STUDY  12/25/2021   IR ANGIOGRAM FOLLOW UP STUDY  12/25/2021   IR ANGIOGRAM  FOLLOW UP STUDY  03/31/2022   IR CT HEAD LTD  03/31/2022   IR NEURO EACH ADD'L AFTER BASIC UNI RIGHT (MS)  12/25/2021   IR NEURO EACH ADD'L AFTER BASIC UNI RIGHT (MS)  03/31/2022   IR RADIOLOGIST EVAL & MGMT  10/16/2021   IR RADIOLOGIST EVAL & MGMT  04/16/2022   IR TRANSCATH/EMBOLIZ  12/25/2021   IR TRANSCATH/EMBOLIZ  03/31/2022   IR US GUIDE VASC ACCESS RIGHT  11/08/2021   IR US GUIDE VASC ACCESS RIGHT  12/25/2021   LEFT HEART CATH AND CORONARY ANGIOGRAPHY Left 09/11/2020   Procedure: LEFT HEART CATH AND CORONARY ANGIOGRAPHY;  Surgeon: Yvonne Kendall, MD;  Location: [facility] INVASIVE CV LAB;  Service: Cardiovascular;  Laterality: Left;   OVARIAN CYST REMOVAL     RADIOLOGY WITH ANESTHESIA N/A 11/08/2021   Procedure: Rosalin Hawking;  Surgeon: Julieanne Cotton, MD;  Location: [facility] OR;  Service: Radiology;  Laterality: N/A;   RADIOLOGY WITH ANESTHESIA N/A 12/25/2021   Procedure: EMBOLIZATION;  Surgeon: Julieanne Cotton, MD;  Location: [facility] OR;  Service: Radiology;  Laterality: N/A;   RADIOLOGY WITH ANESTHESIA N/A 03/31/2022   Procedure: ANEURYSM EMBOLIZATION;  Surgeon: Julieanne Cotton, MD;  Location: [facility] OR;  Service: Radiology;  Laterality: N/A;   Family History Family History  Problem Relation Age of Onset   CAD Mother    Depression Mother    Heart disease Mother    Hyperlipidemia Mother    Hypertension Mother    Heart disease Father    Alcohol abuse Father    Cancer Brother 60       brain   CAD Brother    Depression Brother    Diabetes Brother    Heart disease  Brother    Hyperlipidemia Brother    Lupus Other    Sickle cell anemia Other     Social History Social History   Tobacco Use   Smoking status: Former    Current packs/day: 0.00    Average packs/day: 1 pack/day for 30.0 years (30.0 ttl pk-yrs)    Types: Cigarettes    Start date: 01/01/1991    Quit date: 12/31/2020    Years since quitting: 2.7   Smokeless tobacco: Never   Tobacco comments:    Quit after being admitted on 6/20  01/17/2021  Vaping Use   Vaping status: Former  Substance Use Topics   Alcohol use: Not Currently    Comment: once a year   Drug use: Yes    Types: Marijuana    Comment: Uses Daily   Allergies Ativan [lorazepam], Haldol [haloperidol lactate], Augmentin [amoxicillin-pot clavulanate], Lactose intolerance (gi), Latex, Tape, Xifaxan [rifaximin], Drixoral allergy sinus [dexbromphen-pse-apap er], and Tilactase  Review of Systems Review of Systems  Gastrointestinal:  Positive for abdominal pain, nausea and vomiting.    Physical Exam Vital Signs  I have reviewed the triage vital signs BP (!) 148/77 (BP Location: Right Wrist)   Pulse (!) 110   Temp 99.3 F (37.4 C) (Oral)   Resp 17   Ht 5\' 3"  (1.6 m)   Wt 91.6 kg   SpO2 98%   BMI 35.78 kg/m   Physical Exam Vitals and nursing note reviewed.  Constitutional:      General: She is not in acute distress.    Appearance: She is well-developed.  HENT:     Head: Normocephalic and atraumatic.  Eyes:     Conjunctiva/sclera: Conjunctivae normal.  Cardiovascular:     Rate and Rhythm: Normal rate and regular rhythm.     Heart sounds: No murmur heard. Pulmonary:     Effort: Pulmonary effort is normal. No respiratory distress.     Breath sounds: Normal breath sounds.  Abdominal:     Palpations: Abdomen is soft.     Tenderness: There is generalized abdominal tenderness.  Musculoskeletal:        General: No swelling.     Cervical back: Neck supple.  Skin:    General: Skin is warm and dry.     Capillary Refill: Capillary refill takes less than 2 seconds.  Neurological:     Mental Status: She is alert.  Psychiatric:        Mood and Affect: Mood normal.     ED Results and Treatments Labs (all labs ordered are listed, but only abnormal results are displayed) Labs Reviewed  COMPREHENSIVE METABOLIC PANEL - Abnormal; Notable for the following components:      Result Value   CO2 21 (*)    Glucose, Bld 161 (*)    Calcium 8.7 (*)     Total Bilirubin 1.3 (*)    All other components within normal limits  CBC - Abnormal; Notable for the following components:   WBC 21.3 (*)    Platelets 536 (*)    All other components within normal limits  CULTURE, BLOOD (ROUTINE X 2)  CULTURE, BLOOD (ROUTINE X 2)  LIPASE, BLOOD  URINALYSIS, ROUTINE W REFLEX MICROSCOPIC  I-STAT CG4 LACTIC ACID, ED  Radiology No results found.  Pertinent labs & imaging results that were available during my care of the patient were reviewed by me and considered in my medical decision making (see MDM for details).  Medications Ordered in ED Medications  ketorolac (TORADOL) 15 MG/ML injection 15 mg (15 mg Intravenous Patient Refused/Not Given 09/15/23 0106)  ondansetron (ZOFRAN) injection 4 mg (4 mg Intravenous Given 09/15/23 0002)  lactated ringers bolus 1,000 mL (0 mLs Intravenous Stopped 09/15/23 0338)  prochlorperazine (COMPAZINE) injection 10 mg (10 mg Intravenous Given 09/15/23 0111)  diazepam (VALIUM) injection 5 mg (5 mg Intravenous Given 09/15/23 0109)                                                                                                                                     Procedures Procedures  (including critical care time)  Medical Decision Making / ED Course   This patient presents to the ED for concern of abdominal pain, nausea, vomiting, this involves an extensive number of treatment options, and is a complaint that carries with it a high risk of complications and morbidity.  The differential diagnosis includes GERD/gastritis, peptic ulcer disease, pancreatitis, gastroparesis, pneumonia, pleurisy, pericarditis  MDM: Patient seen emergency room for evaluation of abdominal pain nausea and vomiting.  Physical exam with general abdominal tenderness but no guarding.  Otherwise unremarkable.  Laboratory evaluation with  leukocytosis of 21.3, urinalysis unremarkable, CO2 21.  Lactic acid is normal.  CT abdomen pelvis was performed yesterday and with similar symptoms today, suspect that patient likely has Ozempic induced gastroparesis versus cannabinoid hyperemesis and additional repeat CT imaging likely would not change management.  Thus CT deferred.  Patient given Valium and Compazine with fluid resuscitation and on reevaluation her symptoms have significant proved.  She is able to tolerate p.o. without difficulty.  We did have a discussion about her marijuana use and I did encourage cessation.  She states that she is also been having issues at the rectum and rectal exam reveals an external hemorrhoid that is nonbleeding.  Anusol suppository sent to pharmacy.  Patient's symptoms today most likely Ozempic induced gastroparesis worse in the setting of a recent dose increase.  She was encouraged to talk to her physician about returning to the previous dose but as she is able to tolerate p.o. without difficulty after medication, she currently does not meet inpatient criteria for admission and will be discharged with outpatient follow-up.  Return precautions given which she voiced understanding.   Additional history obtained:  -External records from outside source obtained and reviewed including: Chart review including previous notes, labs, imaging, consultation notes   Lab Tests: -I ordered, reviewed, and interpreted labs.   The pertinent results include:   Labs Reviewed  COMPREHENSIVE METABOLIC PANEL - Abnormal; Notable for the following components:      Result Value   CO2 21 (*)    Glucose, Bld 161 (*)    Calcium  8.7 (*)    Total Bilirubin 1.3 (*)    All other components within normal limits  CBC - Abnormal; Notable for the following components:   WBC 21.3 (*)    Platelets 536 (*)    All other components within normal limits  CULTURE, BLOOD (ROUTINE X 2)  CULTURE, BLOOD (ROUTINE X 2)  LIPASE, BLOOD   URINALYSIS, ROUTINE W REFLEX MICROSCOPIC  I-STAT CG4 LACTIC ACID, ED      Medicines ordered and prescription drug management: Meds ordered this encounter  Medications   ondansetron (ZOFRAN) injection 4 mg   lactated ringers bolus 1,000 mL   prochlorperazine (COMPAZINE) injection 10 mg   diazepam (VALIUM) injection 5 mg   ketorolac (TORADOL) 15 MG/ML injection 15 mg    -I have reviewed the patients home medicines and have made adjustments as needed  Critical interventions none    Cardiac Monitoring: The patient was maintained on a cardiac monitor.  I personally viewed and interpreted the cardiac monitored which showed an underlying rhythm of: NSR, sinus tachycardia  Social Determinants of Health:  Factors impacting patients care include: Daily marijuana use   Reevaluation: After the interventions noted above, I reevaluated the patient and found that they have :improved  Co morbidities that complicate the patient evaluation  Past Medical History:  Diagnosis Date   Anxiety    Arthritis    Bilateral shoulders   Asthma    Bipolar 1 disorder (HCC)    Bipolar disorder (HCC)    CAD (coronary artery disease)    s/p stent BMS OM Cx   Cervical herniated disc 04/12/2016   COPD (chronic obstructive pulmonary disease) (HCC)    Depression    Diabetes mellitus with gastroparesis (HCC)    Diabetes mellitus without complication (HCC)    no meds, patient states Borderline"   Diverticulitis    GERD (gastroesophageal reflux disease)    Glaucoma    Hearing loss    bilateral   History of blood transfusion    Hyperlipidemia    Hypertension    Lower back pain    OSA (obstructive sleep apnea)    not using cpap    Plantar fasciitis    bilateral foot pain, b/l feet s/p steroid shots w/o help and left surgery w/o help   PONV (postoperative nausea and vomiting)    Spinal headache    UTI (urinary tract infection)       Dispostion: I considered admission for this patient, but  at this time she does not meet inpatient criteria for admission and will be discharged with outpatient follow-up     Final Clinical Impression(s) / ED Diagnoses Final diagnoses:  None     @PCDICTATION @    Glendora Score, MD 09/15/23 [phone]

## 2023-09-20 LAB — CULTURE, BLOOD (ROUTINE X 2)
Culture: NO GROWTH
Culture: NO GROWTH
Special Requests: ADEQUATE

## 2023-10-01 ENCOUNTER — Other Ambulatory Visit: Payer: Self-pay | Admitting: Medical

## 2023-10-01 DIAGNOSIS — E876 Hypokalemia: Secondary | ICD-10-CM

## 2023-11-05 ENCOUNTER — Other Ambulatory Visit: Payer: Self-pay | Admitting: Medical

## 2023-11-05 DIAGNOSIS — E876 Hypokalemia: Secondary | ICD-10-CM

## 2023-12-15 ENCOUNTER — Other Ambulatory Visit: Payer: Self-pay

## 2023-12-15 DIAGNOSIS — E876 Hypokalemia: Secondary | ICD-10-CM

## 2023-12-15 DIAGNOSIS — I25118 Atherosclerotic heart disease of native coronary artery with other forms of angina pectoris: Secondary | ICD-10-CM

## 2023-12-15 MED ORDER — POTASSIUM CHLORIDE ER 10 MEQ PO CPCR: 20.0000 meq | ORAL_CAPSULE | Freq: Every day | ORAL | 0 refills | Status: DC

## 2024-01-08 ENCOUNTER — Encounter (HOSPITAL_COMMUNITY): Payer: Self-pay | Admitting: Interventional Radiology

## 2024-03-19 ENCOUNTER — Other Ambulatory Visit: Payer: Self-pay | Admitting: Medical

## 2024-03-19 DIAGNOSIS — E876 Hypokalemia: Secondary | ICD-10-CM
